# Patient Record
Sex: Male | Born: 1960 | Race: White | Hispanic: No | Smoking: Former smoker
Health system: Southern US, Community
[De-identification: ages and names within clinical notes are randomized; demographics above are authoritative.]

## PROBLEM LIST (undated history)

## (undated) ENCOUNTER — Ambulatory Visit (HOSPITAL_COMMUNITY): Discharge status: Absent without leave | Payer: Medicare Other

## (undated) DIAGNOSIS — N184 Chronic kidney disease, stage 4 (severe): Secondary | ICD-10-CM

## (undated) DIAGNOSIS — M1990 Unspecified osteoarthritis, unspecified site: Secondary | ICD-10-CM

## (undated) DIAGNOSIS — I10 Essential (primary) hypertension: Secondary | ICD-10-CM

## (undated) DIAGNOSIS — F32A Depression, unspecified: Secondary | ICD-10-CM

## (undated) DIAGNOSIS — K219 Gastro-esophageal reflux disease without esophagitis: Secondary | ICD-10-CM

## (undated) DIAGNOSIS — Z992 Dependence on renal dialysis: Secondary | ICD-10-CM

## (undated) DIAGNOSIS — I2510 Atherosclerotic heart disease of native coronary artery without angina pectoris: Secondary | ICD-10-CM

## (undated) DIAGNOSIS — I739 Peripheral vascular disease, unspecified: Secondary | ICD-10-CM

## (undated) DIAGNOSIS — E119 Type 2 diabetes mellitus without complications: Secondary | ICD-10-CM

## (undated) DIAGNOSIS — D638 Anemia in other chronic diseases classified elsewhere: Secondary | ICD-10-CM

## (undated) DIAGNOSIS — I214 Non-ST elevation (NSTEMI) myocardial infarction: Secondary | ICD-10-CM

## (undated) DIAGNOSIS — T4145XA Adverse effect of unspecified anesthetic, initial encounter: Secondary | ICD-10-CM

## (undated) DIAGNOSIS — I499 Cardiac arrhythmia, unspecified: Secondary | ICD-10-CM

## (undated) DIAGNOSIS — F329 Major depressive disorder, single episode, unspecified: Secondary | ICD-10-CM

## (undated) DIAGNOSIS — E7800 Pure hypercholesterolemia, unspecified: Secondary | ICD-10-CM

## (undated) DIAGNOSIS — I509 Heart failure, unspecified: Secondary | ICD-10-CM

## (undated) DIAGNOSIS — T8859XA Other complications of anesthesia, initial encounter: Secondary | ICD-10-CM

## (undated) DIAGNOSIS — J45909 Unspecified asthma, uncomplicated: Secondary | ICD-10-CM

## (undated) HISTORY — PX: INCISION AND DRAINAGE OF WOUND: SHX1803

## (undated) HISTORY — DX: Essential (primary) hypertension: I10

## (undated) HISTORY — DX: Unspecified asthma, uncomplicated: J45.909

---

## 1993-12-21 ENCOUNTER — Encounter: Payer: Self-pay | Admitting: Cardiology

## 2013-07-12 ENCOUNTER — Inpatient Hospital Stay: Payer: Self-pay | Admitting: Student

## 2013-07-12 ENCOUNTER — Ambulatory Visit: Payer: Self-pay | Admitting: Physician Assistant

## 2013-07-12 LAB — CBC WITH DIFFERENTIAL/PLATELET
Basophil %: 1 %
Eosinophil %: 0.1 %
Lymphocyte #: 1.9 10*3/uL (ref 1.0–3.6)
Lymphocyte %: 10.4 %
MCH: 30.2 pg (ref 26.0–34.0)
MCV: 93 fL (ref 80–100)
Monocyte #: 1 x10 3/mm (ref 0.2–1.0)
Monocyte %: 5.6 %
Neutrophil #: 15.2 10*3/uL — ABNORMAL HIGH (ref 1.4–6.5)
Platelet: 283 10*3/uL (ref 150–440)
RBC: 4.19 10*6/uL — ABNORMAL LOW (ref 4.40–5.90)
RDW: 12.7 % (ref 11.5–14.5)

## 2013-07-12 LAB — CREATININE, SERUM
Creatinine: 1.2 mg/dL (ref 0.60–1.30)
EGFR (African American): >60
EGFR (Non-African Amer.): >60

## 2013-07-12 LAB — BASIC METABOLIC PANEL
Anion Gap: 9 (ref 7–16)
Creatinine: 1.38 mg/dL — ABNORMAL HIGH (ref 0.60–1.30)
EGFR (African American): >60
EGFR (Non-African Amer.): 58 — ABNORMAL LOW
Osmolality: 289 (ref 275–301)
Sodium: 133 mmol/L — ABNORMAL LOW (ref 136–145)

## 2013-07-12 LAB — LACTATE DEHYDROGENASE: LDH: 192 U/L (ref 85–241)

## 2013-07-13 LAB — CBC WITH DIFFERENTIAL/PLATELET
Basophil #: 0.1 10*3/uL (ref 0.0–0.1)
Basophil %: 1 %
Eosinophil #: 0.2 10*3/uL (ref 0.0–0.7)
Eosinophil %: 1.6 %
HCT: 35.8 % — ABNORMAL LOW (ref 40.0–52.0)
HGB: 12.2 g/dL — ABNORMAL LOW (ref 13.0–18.0)
Lymphocyte #: 2.5 10*3/uL (ref 1.0–3.6)
Lymphocyte %: 18.1 %
MCH: 30.6 pg (ref 26.0–34.0)
MCHC: 34 g/dL (ref 32.0–36.0)
Monocyte #: 1 x10 3/mm (ref 0.2–1.0)
Neutrophil #: 10.1 10*3/uL — ABNORMAL HIGH (ref 1.4–6.5)
Neutrophil %: 72.2 %
Platelet: 258 10*3/uL (ref 150–440)
RBC: 3.97 10*6/uL — ABNORMAL LOW (ref 4.40–5.90)
RDW: 12.7 % (ref 11.5–14.5)
WBC: 14 10*3/uL — ABNORMAL HIGH (ref 3.8–10.6)

## 2013-07-13 LAB — URINALYSIS, COMPLETE
Bacteria: NONE SEEN
Glucose,UR: >=500 mg/dL (ref 0–75)
Nitrite: NEGATIVE
Protein: 100
RBC,UR: 6 /HPF (ref 0–5)
Specific Gravity: 1.017 (ref 1.003–1.030)

## 2013-07-13 LAB — BASIC METABOLIC PANEL
BUN: 11 mg/dL (ref 7–18)
Calcium, Total: 8.2 mg/dL — ABNORMAL LOW (ref 8.5–10.1)
Co2: 25 mmol/L (ref 21–32)
Creatinine: 1.02 mg/dL (ref 0.60–1.30)
EGFR (African American): >60
EGFR (Non-African Amer.): >60
Glucose: 270 mg/dL — ABNORMAL HIGH (ref 65–99)
Potassium: 3.5 mmol/L (ref 3.5–5.1)
Sodium: 136 mmol/L (ref 136–145)

## 2013-07-13 LAB — HEMOGLOBIN A1C: Hemoglobin A1C: 15.4 % — ABNORMAL HIGH (ref 4.2–6.3)

## 2013-07-13 LAB — LIPID PANEL
Cholesterol: 156 mg/dL (ref 0–200)
HDL Cholesterol: 44 mg/dL (ref 40–60)
Ldl Cholesterol, Calc: 90 mg/dL (ref 0–100)
Triglycerides: 109 mg/dL (ref 0–200)

## 2013-07-14 LAB — CBC WITH DIFFERENTIAL/PLATELET
Basophil #: 0.1 10*3/uL (ref 0.0–0.1)
Basophil %: 0.9 %
Eosinophil #: 0.2 10*3/uL (ref 0.0–0.7)
Eosinophil %: 1.9 %
HCT: 33.8 % — ABNORMAL LOW (ref 40.0–52.0)
Lymphocyte #: 2.3 10*3/uL (ref 1.0–3.6)
Lymphocyte %: 19.5 %
MCH: 30.9 pg (ref 26.0–34.0)
Monocyte #: 1 x10 3/mm (ref 0.2–1.0)
Monocyte %: 8.6 %
Neutrophil #: 8.2 10*3/uL — ABNORMAL HIGH (ref 1.4–6.5)
Neutrophil %: 69.1 %
Platelet: 287 10*3/uL (ref 150–440)
RDW: 12.9 % (ref 11.5–14.5)

## 2013-07-14 LAB — BASIC METABOLIC PANEL
BUN: 13 mg/dL (ref 7–18)
Calcium, Total: 8.1 mg/dL — ABNORMAL LOW (ref 8.5–10.1)
Chloride: 106 mmol/L (ref 98–107)
Co2: 24 mmol/L (ref 21–32)
Creatinine: 1.07 mg/dL (ref 0.60–1.30)
EGFR (Non-African Amer.): >60
Osmolality: 286 (ref 275–301)
Potassium: 3.6 mmol/L (ref 3.5–5.1)

## 2013-07-14 LAB — VANCOMYCIN, TROUGH: Vancomycin, Trough: 10 ug/mL (ref 10–20)

## 2013-07-15 LAB — CBC WITH DIFFERENTIAL/PLATELET
Basophil %: 0.2 %
Eosinophil %: 1.6 %
HCT: 35.2 % — ABNORMAL LOW (ref 40.0–52.0)
HGB: 12.1 g/dL — ABNORMAL LOW (ref 13.0–18.0)
Lymphocyte #: 2.1 10*3/uL (ref 1.0–3.6)
Monocyte %: 8.1 %
Neutrophil %: 74.9 %
RBC: 3.96 10*6/uL — ABNORMAL LOW (ref 4.40–5.90)
RDW: 12.6 % (ref 11.5–14.5)

## 2013-07-16 LAB — CBC WITH DIFFERENTIAL/PLATELET
Basophil #: 0.2 10*3/uL — ABNORMAL HIGH (ref 0.0–0.1)
Eosinophil %: 2.3 %
HCT: 36.6 % — ABNORMAL LOW (ref 40.0–52.0)
HGB: 12.5 g/dL — ABNORMAL LOW (ref 13.0–18.0)
Lymphocyte #: 2.7 10*3/uL (ref 1.0–3.6)
Lymphocyte %: 20.4 %
MCH: 30.4 pg (ref 26.0–34.0)
MCV: 89 fL (ref 80–100)
Monocyte #: 1.3 x10 3/mm — ABNORMAL HIGH (ref 0.2–1.0)
Monocyte %: 9.7 %
Neutrophil #: 8.7 10*3/uL — ABNORMAL HIGH (ref 1.4–6.5)
RBC: 4.13 10*6/uL — ABNORMAL LOW (ref 4.40–5.90)

## 2013-07-16 LAB — BASIC METABOLIC PANEL
BUN: 12 mg/dL (ref 7–18)
Chloride: 103 mmol/L (ref 98–107)
Co2: 29 mmol/L (ref 21–32)
Creatinine: 1.22 mg/dL (ref 0.60–1.30)
Osmolality: 282 (ref 275–301)
Potassium: 3.4 mmol/L — ABNORMAL LOW (ref 3.5–5.1)
Sodium: 139 mmol/L (ref 136–145)

## 2013-07-16 LAB — VANCOMYCIN, TROUGH: Vancomycin, Trough: 20 ug/mL (ref 10–20)

## 2013-07-17 LAB — BASIC METABOLIC PANEL
Anion Gap: 6 — ABNORMAL LOW (ref 7–16)
BUN: 14 mg/dL (ref 7–18)
Calcium, Total: 8.9 mg/dL (ref 8.5–10.1)
Chloride: 102 mmol/L (ref 98–107)
Co2: 28 mmol/L (ref 21–32)
Creatinine: 1.37 mg/dL — ABNORMAL HIGH (ref 0.60–1.30)
EGFR (African American): >60
EGFR (Non-African Amer.): 58 — ABNORMAL LOW
Glucose: 244 mg/dL — ABNORMAL HIGH (ref 65–99)
Osmolality: 281 (ref 275–301)
Sodium: 136 mmol/L (ref 136–145)

## 2013-07-17 LAB — CBC WITH DIFFERENTIAL/PLATELET
Basophil #: 0.1 10*3/uL (ref 0.0–0.1)
Basophil %: 0.4 %
HCT: 36.5 % — ABNORMAL LOW (ref 40.0–52.0)
HGB: 12.4 g/dL — ABNORMAL LOW (ref 13.0–18.0)
Lymphocyte %: 18.7 %
MCH: 30.5 pg (ref 26.0–34.0)
MCHC: 34.1 g/dL (ref 32.0–36.0)
Monocyte %: 7.9 %
Neutrophil #: 10.8 10*3/uL — ABNORMAL HIGH (ref 1.4–6.5)
Neutrophil %: 70.2 %
WBC: 15.3 10*3/uL — ABNORMAL HIGH (ref 3.8–10.6)

## 2013-07-17 LAB — CULTURE, BLOOD (SINGLE)

## 2013-07-18 LAB — VANCOMYCIN, TROUGH: Vancomycin, Trough: 18 ug/mL (ref 10–20)

## 2013-07-18 LAB — CBC WITH DIFFERENTIAL/PLATELET
Basophil #: 0 10*3/uL (ref 0.0–0.1)
Basophil %: 0.3 %
Eosinophil #: 0.3 10*3/uL (ref 0.0–0.7)
Eosinophil %: 1.9 %
HGB: 11.9 g/dL — ABNORMAL LOW (ref 13.0–18.0)
Lymphocyte #: 3 10*3/uL (ref 1.0–3.6)
MCHC: 34.4 g/dL (ref 32.0–36.0)
MCV: 89 fL (ref 80–100)
Monocyte #: 1.6 x10 3/mm — ABNORMAL HIGH (ref 0.2–1.0)
Monocyte %: 9.2 %
Neutrophil %: 71.2 %
RBC: 3.91 10*6/uL — ABNORMAL LOW (ref 4.40–5.90)

## 2013-07-18 LAB — CREATININE, SERUM
Creatinine: 1.3 mg/dL (ref 0.60–1.30)
EGFR (African American): >60
EGFR (Non-African Amer.): >60

## 2013-07-18 LAB — WOUND CULTURE

## 2013-07-19 LAB — CBC WITH DIFFERENTIAL/PLATELET
Basophil #: 0.2 10*3/uL — ABNORMAL HIGH (ref 0.0–0.1)
Basophil %: 1.5 %
Eosinophil #: 0.4 10*3/uL (ref 0.0–0.7)
HCT: 35.3 % — ABNORMAL LOW (ref 40.0–52.0)
HGB: 12.2 g/dL — ABNORMAL LOW (ref 13.0–18.0)
Lymphocyte %: 16.3 %
Monocyte #: 1.5 x10 3/mm — ABNORMAL HIGH (ref 0.2–1.0)
Monocyte %: 9.8 %
Neutrophil #: 10.7 10*3/uL — ABNORMAL HIGH (ref 1.4–6.5)
Platelet: 435 10*3/uL (ref 150–440)
RDW: 12.3 % (ref 11.5–14.5)
WBC: 15.3 10*3/uL — ABNORMAL HIGH (ref 3.8–10.6)

## 2013-07-20 LAB — CBC WITH DIFFERENTIAL/PLATELET
Basophil #: 0.2 10*3/uL — ABNORMAL HIGH (ref 0.0–0.1)
Basophil %: 1.5 %
Eosinophil #: 0.5 10*3/uL (ref 0.0–0.7)
HGB: 11.8 g/dL — ABNORMAL LOW (ref 13.0–18.0)
Lymphocyte #: 2.7 10*3/uL (ref 1.0–3.6)
Lymphocyte %: 18.3 %
MCV: 89 fL (ref 80–100)
Monocyte #: 1.5 x10 3/mm — ABNORMAL HIGH (ref 0.2–1.0)
RBC: 3.87 10*6/uL — ABNORMAL LOW (ref 4.40–5.90)
RDW: 12.6 % (ref 11.5–14.5)

## 2013-07-20 LAB — BASIC METABOLIC PANEL
Anion Gap: 4 — ABNORMAL LOW (ref 7–16)
Calcium, Total: 8.8 mg/dL (ref 8.5–10.1)
Chloride: 101 mmol/L (ref 98–107)
Co2: 30 mmol/L (ref 21–32)
EGFR (Non-African Amer.): 56 — ABNORMAL LOW
Potassium: 3.8 mmol/L (ref 3.5–5.1)
Sodium: 135 mmol/L — ABNORMAL LOW (ref 136–145)

## 2013-07-20 LAB — VANCOMYCIN, TROUGH: Vancomycin, Trough: 21 ug/mL (ref 10–20)

## 2013-07-21 LAB — CBC WITH DIFFERENTIAL/PLATELET
Basophil #: 0.2 10*3/uL — ABNORMAL HIGH (ref 0.0–0.1)
Eosinophil %: 2.7 %
HCT: 34.2 % — ABNORMAL LOW (ref 40.0–52.0)
HGB: 11.5 g/dL — ABNORMAL LOW (ref 13.0–18.0)
Lymphocyte #: 3.3 10*3/uL (ref 1.0–3.6)
Lymphocyte %: 19 %
MCH: 29.8 pg (ref 26.0–34.0)
Monocyte #: 1.4 x10 3/mm — ABNORMAL HIGH (ref 0.2–1.0)
Monocyte %: 8 %
Neutrophil #: 12 10*3/uL — ABNORMAL HIGH (ref 1.4–6.5)
Neutrophil %: 69 %
RDW: 12.7 % (ref 11.5–14.5)
WBC: 17.4 10*3/uL — ABNORMAL HIGH (ref 3.8–10.6)

## 2013-07-22 LAB — CBC WITH DIFFERENTIAL/PLATELET
HGB: 11.7 g/dL — ABNORMAL LOW (ref 13.0–18.0)
Lymphocyte #: 2.3 10*3/uL (ref 1.0–3.6)
MCHC: 34 g/dL (ref 32.0–36.0)
Monocyte #: 1.2 x10 3/mm — ABNORMAL HIGH (ref 0.2–1.0)
Monocyte %: 7.8 %
Neutrophil #: 11.4 10*3/uL — ABNORMAL HIGH (ref 1.4–6.5)
Neutrophil %: 73.5 %
Platelet: 530 10*3/uL — ABNORMAL HIGH (ref 150–440)
WBC: 15.5 10*3/uL — ABNORMAL HIGH (ref 3.8–10.6)

## 2013-07-22 LAB — BASIC METABOLIC PANEL
Anion Gap: 5 — ABNORMAL LOW (ref 7–16)
BUN: 22 mg/dL — ABNORMAL HIGH (ref 7–18)
Calcium, Total: 8.5 mg/dL (ref 8.5–10.1)
EGFR (African American): >60

## 2013-07-22 LAB — VANCOMYCIN, TROUGH: Vancomycin, Trough: 16 ug/mL (ref 10–20)

## 2013-07-23 LAB — CBC WITH DIFFERENTIAL/PLATELET
Basophil #: 0.2 10*3/uL — ABNORMAL HIGH (ref 0.0–0.1)
Eosinophil #: 0.6 10*3/uL (ref 0.0–0.7)
HCT: 34.4 % — ABNORMAL LOW (ref 40.0–52.0)
HGB: 11.9 g/dL — ABNORMAL LOW (ref 13.0–18.0)
Lymphocyte %: 20 %
MCH: 30.1 pg (ref 26.0–34.0)
MCHC: 34.5 g/dL (ref 32.0–36.0)
MCV: 87 fL (ref 80–100)
Monocyte #: 1.2 x10 3/mm — ABNORMAL HIGH (ref 0.2–1.0)
Monocyte %: 8.7 %
Neutrophil %: 65.3 %
Platelet: 564 10*3/uL — ABNORMAL HIGH (ref 150–440)
RDW: 12.5 % (ref 11.5–14.5)

## 2013-07-25 LAB — PATHOLOGY REPORT

## 2013-08-31 HISTORY — PX: TOE AMPUTATION: SHX809

## 2014-02-17 ENCOUNTER — Emergency Department: Payer: Self-pay | Admitting: Emergency Medicine

## 2014-02-20 ENCOUNTER — Emergency Department: Payer: Self-pay | Admitting: Emergency Medicine

## 2014-02-20 LAB — COMPREHENSIVE METABOLIC PANEL
ANION GAP: 9 (ref 7–16)
Albumin: 2.9 g/dL — ABNORMAL LOW (ref 3.4–5.0)
Alkaline Phosphatase: 134 U/L — ABNORMAL HIGH
BILIRUBIN TOTAL: 0.3 mg/dL (ref 0.2–1.0)
BUN: 27 mg/dL — ABNORMAL HIGH (ref 7–18)
CALCIUM: 9.3 mg/dL (ref 8.5–10.1)
CREATININE: 1.71 mg/dL — AB (ref 0.60–1.30)
Chloride: 99 mmol/L (ref 98–107)
Co2: 24 mmol/L (ref 21–32)
EGFR (African American): 51 — ABNORMAL LOW
EGFR (Non-African Amer.): 44 — ABNORMAL LOW
GLUCOSE: 451 mg/dL — AB (ref 65–99)
Osmolality: 289 (ref 275–301)
Potassium: 4.4 mmol/L (ref 3.5–5.1)
SGOT(AST): 12 U/L — ABNORMAL LOW (ref 15–37)
SGPT (ALT): 15 U/L (ref 12–78)
Sodium: 132 mmol/L — ABNORMAL LOW (ref 136–145)
Total Protein: 7.1 g/dL (ref 6.4–8.2)

## 2014-02-20 LAB — CBC WITH DIFFERENTIAL/PLATELET
BASOS ABS: 0 10*3/uL (ref 0.0–0.1)
Basophil %: 0.1 %
EOS ABS: 0.3 10*3/uL (ref 0.0–0.7)
Eosinophil %: 1.7 %
HCT: 36.7 % — ABNORMAL LOW (ref 40.0–52.0)
HGB: 12.5 g/dL — AB (ref 13.0–18.0)
Lymphocyte #: 2.6 10*3/uL (ref 1.0–3.6)
Lymphocyte %: 16.5 %
MCH: 30.8 pg (ref 26.0–34.0)
MCHC: 34 g/dL (ref 32.0–36.0)
MCV: 91 fL (ref 80–100)
MONO ABS: 0.9 x10 3/mm (ref 0.2–1.0)
Monocyte %: 6.1 %
NEUTROS ABS: 11.7 10*3/uL — AB (ref 1.4–6.5)
NEUTROS PCT: 75.6 %
PLATELETS: 297 10*3/uL (ref 150–440)
RBC: 4.05 10*6/uL — ABNORMAL LOW (ref 4.40–5.90)
RDW: 13.4 % (ref 11.5–14.5)
WBC: 15.5 10*3/uL — ABNORMAL HIGH (ref 3.8–10.6)

## 2014-02-20 LAB — HEMOGLOBIN A1C: Hemoglobin A1C: 13.2 % — ABNORMAL HIGH (ref 4.2–6.3)

## 2014-03-05 ENCOUNTER — Encounter: Payer: Self-pay | Admitting: Surgery

## 2014-03-09 LAB — WOUND AEROBIC CULTURE

## 2014-03-31 ENCOUNTER — Encounter: Payer: Self-pay | Admitting: Surgery

## 2014-08-01 ENCOUNTER — Ambulatory Visit: Payer: Self-pay | Admitting: Surgery

## 2014-08-01 ENCOUNTER — Encounter: Payer: Self-pay | Admitting: Surgery

## 2014-08-17 ENCOUNTER — Ambulatory Visit: Payer: Self-pay | Admitting: Surgery

## 2014-08-31 ENCOUNTER — Encounter: Payer: Self-pay | Admitting: Surgery

## 2014-09-14 LAB — WOUND AEROBIC CULTURE

## 2014-09-28 ENCOUNTER — Ambulatory Visit: Payer: Self-pay | Admitting: Surgery

## 2014-10-01 ENCOUNTER — Encounter: Payer: Self-pay | Admitting: Surgery

## 2014-10-03 ENCOUNTER — Inpatient Hospital Stay: Payer: Self-pay | Admitting: Emergency Medicine

## 2014-10-03 LAB — TROPONIN I: Troponin-I: <0.02 ng/mL

## 2014-10-03 LAB — URINALYSIS, COMPLETE
BILIRUBIN, UR: NEGATIVE
Bacteria: NONE SEEN
Glucose,UR: >=500 mg/dL (ref 0–75)
Granular Cast: 1
Hyaline Cast: 3
KETONE: NEGATIVE
Leukocyte Esterase: NEGATIVE
Nitrite: NEGATIVE
PH: 5 (ref 4.5–8.0)
Protein: >=500
SPECIFIC GRAVITY: 1.018 (ref 1.003–1.030)
Squamous Epithelial: NONE SEEN
WBC UR: 1 /HPF (ref 0–5)

## 2014-10-03 LAB — CBC WITH DIFFERENTIAL/PLATELET
Basophil #: 0.2 10*3/uL — ABNORMAL HIGH (ref 0.0–0.1)
Basophil %: 1 %
EOS ABS: 0.1 10*3/uL (ref 0.0–0.7)
Eosinophil %: 0.7 %
HCT: 35.5 % — ABNORMAL LOW (ref 40.0–52.0)
HGB: 11.8 g/dL — ABNORMAL LOW (ref 13.0–18.0)
Lymphocyte #: 2 10*3/uL (ref 1.0–3.6)
Lymphocyte %: 10.3 %
MCH: 30.2 pg (ref 26.0–34.0)
MCHC: 33.4 g/dL (ref 32.0–36.0)
MCV: 90 fL (ref 80–100)
MONO ABS: 1.6 x10 3/mm — AB (ref 0.2–1.0)
Monocyte %: 8.2 %
NEUTROS PCT: 79.8 %
Neutrophil #: 15.8 10*3/uL — ABNORMAL HIGH (ref 1.4–6.5)
PLATELETS: 513 10*3/uL — AB (ref 150–440)
RBC: 3.92 10*6/uL — AB (ref 4.40–5.90)
RDW: 13.3 % (ref 11.5–14.5)
WBC: 19.8 10*3/uL — ABNORMAL HIGH (ref 3.8–10.6)

## 2014-10-03 LAB — COMPREHENSIVE METABOLIC PANEL
ALBUMIN: 1.9 g/dL — AB (ref 3.4–5.0)
ANION GAP: 9 (ref 7–16)
AST: 20 U/L (ref 15–37)
Alkaline Phosphatase: 156 U/L — ABNORMAL HIGH (ref 46–116)
BUN: 26 mg/dL — AB (ref 7–18)
Bilirubin,Total: 0.4 mg/dL (ref 0.2–1.0)
CO2: 26 mmol/L (ref 21–32)
Calcium, Total: 8.8 mg/dL (ref 8.5–10.1)
Chloride: 98 mmol/L (ref 98–107)
Creatinine: 2.38 mg/dL — ABNORMAL HIGH (ref 0.60–1.30)
GFR CALC AF AMER: 37 — AB
GFR CALC NON AF AMER: 30 — AB
Glucose: 453 mg/dL — ABNORMAL HIGH (ref 65–99)
Osmolality: 291 (ref 275–301)
Potassium: 4.2 mmol/L (ref 3.5–5.1)
SGPT (ALT): 24 U/L (ref 14–63)
Sodium: 133 mmol/L — ABNORMAL LOW (ref 136–145)
Total Protein: 7.5 g/dL (ref 6.4–8.2)

## 2014-10-03 LAB — PROTIME-INR
INR: 1.1
PROTHROMBIN TIME: 14.4 s

## 2014-10-03 LAB — MAGNESIUM: MAGNESIUM: 2 mg/dL

## 2014-10-03 LAB — HEMOGLOBIN A1C: Hemoglobin A1C: 10 % — ABNORMAL HIGH (ref 4.2–6.3)

## 2014-10-03 LAB — PHOSPHORUS: Phosphorus: 3.3 mg/dL (ref 2.5–4.9)

## 2014-10-04 LAB — CBC WITH DIFFERENTIAL/PLATELET
BASOS PCT: 0.7 %
Basophil #: 0.2 10*3/uL — ABNORMAL HIGH (ref 0.0–0.1)
EOS ABS: 0.1 10*3/uL (ref 0.0–0.7)
Eosinophil %: 0.6 %
HCT: 29.8 % — AB (ref 40.0–52.0)
HGB: 10.2 g/dL — ABNORMAL LOW (ref 13.0–18.0)
LYMPHS PCT: 15 %
Lymphocyte #: 3.2 10*3/uL (ref 1.0–3.6)
MCH: 30.3 pg (ref 26.0–34.0)
MCHC: 34.2 g/dL (ref 32.0–36.0)
MCV: 89 fL (ref 80–100)
MONO ABS: 2 x10 3/mm — AB (ref 0.2–1.0)
Monocyte %: 9.2 %
NEUTROS ABS: 15.8 10*3/uL — AB (ref 1.4–6.5)
Neutrophil %: 74.5 %
Platelet: 465 10*3/uL — ABNORMAL HIGH (ref 150–440)
RBC: 3.37 10*6/uL — AB (ref 4.40–5.90)
RDW: 13.3 % (ref 11.5–14.5)
WBC: 21.2 10*3/uL — ABNORMAL HIGH (ref 3.8–10.6)

## 2014-10-04 LAB — BASIC METABOLIC PANEL
ANION GAP: 7 (ref 7–16)
BUN: 22 mg/dL — ABNORMAL HIGH (ref 7–18)
Calcium, Total: 8.3 mg/dL — ABNORMAL LOW (ref 8.5–10.1)
Chloride: 104 mmol/L (ref 98–107)
Co2: 25 mmol/L (ref 21–32)
Creatinine: 2.17 mg/dL — ABNORMAL HIGH (ref 0.60–1.30)
GFR CALC AF AMER: 41 — AB
GFR CALC NON AF AMER: 34 — AB
GLUCOSE: 170 mg/dL — AB (ref 65–99)
OSMOLALITY: 279 (ref 275–301)
Potassium: 3.9 mmol/L (ref 3.5–5.1)
Sodium: 136 mmol/L (ref 136–145)

## 2014-10-04 LAB — WOUND AEROBIC CULTURE

## 2014-10-05 LAB — CBC WITH DIFFERENTIAL/PLATELET
BASOS PCT: 0.5 %
Basophil #: 0.1 10*3/uL (ref 0.0–0.1)
EOS ABS: 0.2 10*3/uL (ref 0.0–0.7)
EOS PCT: 0.8 %
HCT: 28.8 % — ABNORMAL LOW (ref 40.0–52.0)
HGB: 9.6 g/dL — AB (ref 13.0–18.0)
LYMPHS PCT: 13.6 %
Lymphocyte #: 2.9 10*3/uL (ref 1.0–3.6)
MCH: 29.6 pg (ref 26.0–34.0)
MCHC: 33.5 g/dL (ref 32.0–36.0)
MCV: 89 fL (ref 80–100)
Monocyte #: 1.9 x10 3/mm — ABNORMAL HIGH (ref 0.2–1.0)
Monocyte %: 8.9 %
NEUTROS PCT: 76.2 %
Neutrophil #: 16.2 10*3/uL — ABNORMAL HIGH (ref 1.4–6.5)
Platelet: 474 10*3/uL — ABNORMAL HIGH (ref 150–440)
RBC: 3.25 10*6/uL — AB (ref 4.40–5.90)
RDW: 13.3 % (ref 11.5–14.5)
WBC: 21.2 10*3/uL — ABNORMAL HIGH (ref 3.8–10.6)

## 2014-10-05 LAB — BASIC METABOLIC PANEL
ANION GAP: 9 (ref 7–16)
BUN: 18 mg/dL (ref 7–18)
CALCIUM: 8 mg/dL — AB (ref 8.5–10.1)
CHLORIDE: 105 mmol/L (ref 98–107)
CREATININE: 2.05 mg/dL — AB (ref 0.60–1.30)
Co2: 24 mmol/L (ref 21–32)
EGFR (African American): 43 — ABNORMAL LOW
EGFR (Non-African Amer.): 36 — ABNORMAL LOW
Glucose: 145 mg/dL — ABNORMAL HIGH (ref 65–99)
OSMOLALITY: 280 (ref 275–301)
Potassium: 3.8 mmol/L (ref 3.5–5.1)
SODIUM: 138 mmol/L (ref 136–145)

## 2014-10-06 LAB — BASIC METABOLIC PANEL
Anion Gap: 9 (ref 7–16)
BUN: 19 mg/dL — ABNORMAL HIGH (ref 7–18)
CALCIUM: 8.1 mg/dL — AB (ref 8.5–10.1)
CHLORIDE: 106 mmol/L (ref 98–107)
CO2: 23 mmol/L (ref 21–32)
Creatinine: 2.3 mg/dL — ABNORMAL HIGH (ref 0.60–1.30)
EGFR (African American): 38 — ABNORMAL LOW
EGFR (Non-African Amer.): 31 — ABNORMAL LOW
Glucose: 163 mg/dL — ABNORMAL HIGH (ref 65–99)
Osmolality: 282 (ref 275–301)
Potassium: 3.9 mmol/L (ref 3.5–5.1)
SODIUM: 138 mmol/L (ref 136–145)

## 2014-10-06 LAB — CBC WITH DIFFERENTIAL/PLATELET
BASOS PCT: 1 %
Basophil #: 0.2 10*3/uL — ABNORMAL HIGH (ref 0.0–0.1)
EOS ABS: 0.3 10*3/uL (ref 0.0–0.7)
EOS PCT: 1.4 %
HCT: 29.5 % — AB (ref 40.0–52.0)
HGB: 9.8 g/dL — AB (ref 13.0–18.0)
Lymphocyte #: 3 10*3/uL (ref 1.0–3.6)
Lymphocyte %: 12.5 %
MCH: 29.8 pg (ref 26.0–34.0)
MCHC: 33.2 g/dL (ref 32.0–36.0)
MCV: 90 fL (ref 80–100)
MONOS PCT: 8.3 %
Monocyte #: 2 x10 3/mm — ABNORMAL HIGH (ref 0.2–1.0)
Neutrophil #: 18.6 10*3/uL — ABNORMAL HIGH (ref 1.4–6.5)
Neutrophil %: 76.8 %
Platelet: 482 10*3/uL — ABNORMAL HIGH (ref 150–440)
RBC: 3.28 10*6/uL — ABNORMAL LOW (ref 4.40–5.90)
RDW: 13.4 % (ref 11.5–14.5)
WBC: 24.2 10*3/uL — AB (ref 3.8–10.6)

## 2014-10-06 LAB — SEDIMENTATION RATE: Erythrocyte Sed Rate: 126 mm/hr — ABNORMAL HIGH (ref 0–20)

## 2014-10-07 LAB — CBC WITH DIFFERENTIAL/PLATELET
BASOS ABS: 0.2 10*3/uL — AB (ref 0.0–0.1)
Basophil %: 0.8 %
Eosinophil #: 0.6 10*3/uL (ref 0.0–0.7)
Eosinophil %: 3.1 %
HCT: 27.5 % — ABNORMAL LOW (ref 40.0–52.0)
HGB: 8.9 g/dL — AB (ref 13.0–18.0)
LYMPHS PCT: 15.2 %
Lymphocyte #: 2.8 10*3/uL (ref 1.0–3.6)
MCH: 29.3 pg (ref 26.0–34.0)
MCHC: 32.5 g/dL (ref 32.0–36.0)
MCV: 90 fL (ref 80–100)
Monocyte #: 1.6 x10 3/mm — ABNORMAL HIGH (ref 0.2–1.0)
Monocyte %: 8.5 %
Neutrophil #: 13.4 10*3/uL — ABNORMAL HIGH (ref 1.4–6.5)
Neutrophil %: 72.4 %
Platelet: 491 10*3/uL — ABNORMAL HIGH (ref 150–440)
RBC: 3.05 10*6/uL — ABNORMAL LOW (ref 4.40–5.90)
RDW: 13.4 % (ref 11.5–14.5)
WBC: 18.5 10*3/uL — ABNORMAL HIGH (ref 3.8–10.6)

## 2014-10-07 LAB — WOUND CULTURE

## 2014-10-07 LAB — BASIC METABOLIC PANEL
Anion Gap: 6 — ABNORMAL LOW (ref 7–16)
BUN: 23 mg/dL — ABNORMAL HIGH (ref 7–18)
CALCIUM: 8.2 mg/dL — AB (ref 8.5–10.1)
CHLORIDE: 108 mmol/L — AB (ref 98–107)
CO2: 25 mmol/L (ref 21–32)
Creatinine: 2.47 mg/dL — ABNORMAL HIGH (ref 0.60–1.30)
EGFR (African American): 35 — ABNORMAL LOW
GFR CALC NON AF AMER: 29 — AB
Glucose: 106 mg/dL — ABNORMAL HIGH (ref 65–99)
OSMOLALITY: 282 (ref 275–301)
POTASSIUM: 3.9 mmol/L (ref 3.5–5.1)
SODIUM: 139 mmol/L (ref 136–145)

## 2014-10-07 LAB — VANCOMYCIN, TROUGH: Vancomycin, Trough: 13 ug/mL (ref 10–20)

## 2014-10-08 LAB — BASIC METABOLIC PANEL
Anion Gap: 8 (ref 7–16)
BUN: 18 mg/dL (ref 7–18)
CALCIUM: 8 mg/dL — AB (ref 8.5–10.1)
Chloride: 105 mmol/L (ref 98–107)
Co2: 23 mmol/L (ref 21–32)
Creatinine: 2.28 mg/dL — ABNORMAL HIGH (ref 0.60–1.30)
EGFR (African American): 38 — ABNORMAL LOW
GFR CALC NON AF AMER: 32 — AB
Glucose: 186 mg/dL — ABNORMAL HIGH (ref 65–99)
OSMOLALITY: 279 (ref 275–301)
POTASSIUM: 3.7 mmol/L (ref 3.5–5.1)
Sodium: 136 mmol/L (ref 136–145)

## 2014-10-08 LAB — WOUND CULTURE

## 2014-10-08 LAB — CULTURE, BLOOD (SINGLE)

## 2014-10-14 ENCOUNTER — Ambulatory Visit: Payer: Self-pay

## 2014-10-22 ENCOUNTER — Other Ambulatory Visit: Payer: Self-pay | Admitting: Infectious Diseases

## 2014-11-30 HISTORY — PX: CARDIAC CATHETERIZATION: SHX172

## 2014-12-01 ENCOUNTER — Inpatient Hospital Stay
Admit: 2014-12-01 | Disposition: A | Discharge status: Home / Self Care | Payer: Self-pay | Attending: Internal Medicine | Admitting: Internal Medicine

## 2014-12-01 LAB — TROPONIN I
TROPONIN-I: 1.53 ng/mL — AB
Troponin-I: 0.05 ng/mL — ABNORMAL HIGH
Troponin-I: 4.31 ng/mL — ABNORMAL HIGH

## 2014-12-01 LAB — CBC
HCT: 32.6 % — ABNORMAL LOW (ref 40.0–52.0)
HGB: 10.4 g/dL — ABNORMAL LOW (ref 13.0–18.0)
MCH: 29.2 pg (ref 26.0–34.0)
MCHC: 32.1 g/dL (ref 32.0–36.0)
MCV: 91 fL (ref 80–100)
Platelet: 396 10*3/uL (ref 150–440)
RBC: 3.58 10*6/uL — ABNORMAL LOW (ref 4.40–5.90)
RDW: 16 % — ABNORMAL HIGH (ref 11.5–14.5)
WBC: 15.1 10*3/uL — ABNORMAL HIGH (ref 3.8–10.6)

## 2014-12-01 LAB — HEPARIN LEVEL (UNFRACTIONATED)
ANTI-XA(UNFRACTIONATED): 0.1 [IU]/mL — AB (ref 0.30–0.70)
Anti-Xa(Unfractionated): 0.14 IU/mL — ABNORMAL LOW (ref 0.30–0.70)

## 2014-12-01 LAB — BASIC METABOLIC PANEL
Anion Gap: 7 (ref 7–16)
BUN: 25 mg/dL — ABNORMAL HIGH
CO2: 21 mmol/L — AB
Calcium, Total: 7.8 mg/dL — ABNORMAL LOW
Chloride: 111 mmol/L
Creatinine: 1.7 mg/dL — ABNORMAL HIGH
EGFR (African American): 51 — ABNORMAL LOW
EGFR (Non-African Amer.): 44 — ABNORMAL LOW
GLUCOSE: 359 mg/dL — AB
Potassium: 5.6 mmol/L — ABNORMAL HIGH
SODIUM: 139 mmol/L

## 2014-12-01 LAB — APTT: ACTIVATED PTT: 32.1 s (ref 23.6–35.9)

## 2014-12-01 LAB — PRO B NATRIURETIC PEPTIDE: B-Type Natriuretic Peptide: 130 pg/mL — ABNORMAL HIGH

## 2014-12-01 LAB — CK-MB
CK-MB: 15.1 ng/mL — ABNORMAL HIGH
CK-MB: 18 ng/mL — ABNORMAL HIGH
CK-MB: 15.4 ng/mL — AB

## 2014-12-01 LAB — PROTIME-INR
INR: 0.9
Prothrombin Time: 12.8 secs

## 2014-12-02 LAB — BASIC METABOLIC PANEL
Anion Gap: 7 (ref 7–16)
BUN: 26 mg/dL — ABNORMAL HIGH
CO2: 24 mmol/L
CREATININE: 1.72 mg/dL — AB
Calcium, Total: 8.2 mg/dL — ABNORMAL LOW
Chloride: 109 mmol/L
GFR CALC AF AMER: 50 — AB
GFR CALC NON AF AMER: 43 — AB
GLUCOSE: 210 mg/dL — AB
Potassium: 4.1 mmol/L
SODIUM: 140 mmol/L

## 2014-12-02 LAB — CBC WITH DIFFERENTIAL/PLATELET
BASOS PCT: 1 %
Basophil #: 0.1 10*3/uL (ref 0.0–0.1)
Eosinophil #: 0.4 10*3/uL (ref 0.0–0.7)
Eosinophil %: 3.2 %
HCT: 30.1 % — AB (ref 40.0–52.0)
HGB: 9.9 g/dL — AB (ref 13.0–18.0)
Lymphocyte #: 2.6 10*3/uL (ref 1.0–3.6)
Lymphocyte %: 23.5 %
MCH: 29.5 pg (ref 26.0–34.0)
MCHC: 32.9 g/dL (ref 32.0–36.0)
MCV: 90 fL (ref 80–100)
Monocyte #: 0.7 x10 3/mm (ref 0.2–1.0)
Monocyte %: 6.1 %
NEUTROS ABS: 7.4 10*3/uL — AB (ref 1.4–6.5)
Neutrophil %: 66.2 %
Platelet: 395 10*3/uL (ref 150–440)
RBC: 3.36 10*6/uL — ABNORMAL LOW (ref 4.40–5.90)
RDW: 15.9 % — AB (ref 11.5–14.5)
WBC: 11.2 10*3/uL — ABNORMAL HIGH (ref 3.8–10.6)

## 2014-12-02 LAB — LIPID PANEL
Cholesterol: 146 mg/dL
HDL Cholesterol: 45 mg/dL
Ldl Cholesterol, Calc: 74 mg/dL
Triglycerides: 134 mg/dL
VLDL CHOLESTEROL, CALC: 27 mg/dL

## 2014-12-02 LAB — HEPARIN LEVEL (UNFRACTIONATED)
ANTI-XA(UNFRACTIONATED): 0.31 [IU]/mL (ref 0.30–0.70)
Anti-Xa(Unfractionated): 0.35 IU/mL (ref 0.30–0.70)
Anti-Xa(Unfractionated): 0.29 IU/mL — ABNORMAL LOW (ref 0.30–0.70)

## 2014-12-03 LAB — RENAL FUNCTION PANEL
Albumin: 2.3 g/dL — ABNORMAL LOW
Anion Gap: 6 — ABNORMAL LOW (ref 7–16)
BUN: 23 mg/dL — ABNORMAL HIGH
CALCIUM: 8.3 mg/dL — AB
CHLORIDE: 110 mmol/L
Co2: 25 mmol/L
Creatinine: 1.67 mg/dL — ABNORMAL HIGH
EGFR (African American): 52 — ABNORMAL LOW
EGFR (Non-African Amer.): 45 — ABNORMAL LOW
Glucose: 128 mg/dL — ABNORMAL HIGH
Phosphorus: 4.3 mg/dL
Potassium: 3.8 mmol/L
Sodium: 141 mmol/L

## 2014-12-03 LAB — URINALYSIS, COMPLETE
BILIRUBIN, UR: NEGATIVE
BLOOD: NEGATIVE
Bacteria: NONE SEEN
Glucose,UR: 50 mg/dL (ref 0–75)
KETONE: NEGATIVE
Leukocyte Esterase: NEGATIVE
Nitrite: NEGATIVE
PH: 6 (ref 4.5–8.0)
Protein: >=500
RBC,UR: 1 /HPF (ref 0–5)
SPECIFIC GRAVITY: 1.011 (ref 1.003–1.030)
WBC UR: 1 /HPF (ref 0–5)

## 2014-12-03 LAB — CBC WITH DIFFERENTIAL/PLATELET
Basophil #: 0.1 10*3/uL (ref 0.0–0.1)
Basophil %: 1.1 %
Eosinophil #: 0.5 10*3/uL (ref 0.0–0.7)
Eosinophil %: 4.3 %
HCT: 29.5 % — AB (ref 40.0–52.0)
HGB: 9.5 g/dL — ABNORMAL LOW (ref 13.0–18.0)
Lymphocyte #: 2.6 10*3/uL (ref 1.0–3.6)
Lymphocyte %: 23.3 %
MCH: 28.8 pg (ref 26.0–34.0)
MCHC: 32.3 g/dL (ref 32.0–36.0)
MCV: 89 fL (ref 80–100)
MONO ABS: 0.7 x10 3/mm (ref 0.2–1.0)
MONOS PCT: 6.6 %
NEUTROS ABS: 7.3 10*3/uL — AB (ref 1.4–6.5)
Neutrophil %: 64.7 %
Platelet: 367 10*3/uL (ref 150–440)
RBC: 3.3 10*6/uL — ABNORMAL LOW (ref 4.40–5.90)
RDW: 15.6 % — AB (ref 11.5–14.5)
WBC: 11.2 10*3/uL — AB (ref 3.8–10.6)

## 2014-12-03 LAB — HEPATIC FUNCTION PANEL A (ARMC)
ALBUMIN: 2.4 g/dL — AB
Alkaline Phosphatase: 106 U/L
Bilirubin,Total: 0.1 mg/dL — ABNORMAL LOW
SGOT(AST): 12 U/L — ABNORMAL LOW
SGPT (ALT): 8 U/L — ABNORMAL LOW
TOTAL PROTEIN: 5.8 g/dL — AB

## 2014-12-03 LAB — HEPARIN LEVEL (UNFRACTIONATED): Anti-Xa(Unfractionated): 0.37 IU/mL (ref 0.30–0.70)

## 2014-12-03 LAB — PROTEIN / CREATININE RATIO, URINE
Creatinine, Urine Random: 63 mg/dL (ref 30–125)
Protein, Urine: 279 mg/dL (ref 0–9)
Protein/Creat. Ratio: >3000 mg/gCREAT (ref 0–200)

## 2014-12-04 LAB — BASIC METABOLIC PANEL
Anion Gap: 8 (ref 7–16)
BUN: 21 mg/dL — ABNORMAL HIGH
CALCIUM: 8.2 mg/dL — AB
CHLORIDE: 105 mmol/L
CO2: 26 mmol/L
Creatinine: 1.71 mg/dL — ABNORMAL HIGH
EGFR (African American): 51 — ABNORMAL LOW
GFR CALC NON AF AMER: 44 — AB
Glucose: 151 mg/dL — ABNORMAL HIGH
Potassium: 3.5 mmol/L
SODIUM: 139 mmol/L

## 2014-12-04 LAB — CBC WITH DIFFERENTIAL/PLATELET
Basophil #: 0.1 10*3/uL (ref 0.0–0.1)
Basophil %: 0.8 %
Eosinophil #: 0.5 10*3/uL (ref 0.0–0.7)
Eosinophil %: 5 %
HCT: 29.7 % — ABNORMAL LOW (ref 40.0–52.0)
HGB: 9.6 g/dL — ABNORMAL LOW (ref 13.0–18.0)
Lymphocyte #: 2.3 10*3/uL (ref 1.0–3.6)
Lymphocyte %: 25.4 %
MCH: 28.8 pg (ref 26.0–34.0)
MCHC: 32.3 g/dL (ref 32.0–36.0)
MCV: 89 fL (ref 80–100)
Monocyte #: 0.8 x10 3/mm (ref 0.2–1.0)
Monocyte %: 8.3 %
Neutrophil #: 5.5 10*3/uL (ref 1.4–6.5)
Neutrophil %: 60.5 %
Platelet: 341 10*3/uL (ref 150–440)
RBC: 3.33 10*6/uL — ABNORMAL LOW (ref 4.40–5.90)
RDW: 15.3 % — ABNORMAL HIGH (ref 11.5–14.5)
WBC: 9.1 10*3/uL (ref 3.8–10.6)

## 2014-12-04 LAB — PROTEIN ELECTROPHORESIS(ARMC)

## 2014-12-04 LAB — KAPPA/LAMBDA FREE LIGHT CHAINS (ARMC)

## 2014-12-05 LAB — UR PROT ELECTROPHORESIS, URINE RANDOM

## 2014-12-06 LAB — CULTURE, BLOOD (SINGLE)

## 2014-12-13 ENCOUNTER — Inpatient Hospital Stay (HOSPITAL_COMMUNITY): Payer: Medicaid Other

## 2014-12-13 ENCOUNTER — Inpatient Hospital Stay (HOSPITAL_COMMUNITY)
Admission: AD | Admit: 2014-12-13 | Discharge: 2014-12-19 | DRG: 246 | Disposition: A | Discharge status: Home / Self Care | Payer: Medicaid Other | Source: Other Acute Inpatient Hospital | Attending: Cardiology | Admitting: Cardiology

## 2014-12-13 ENCOUNTER — Encounter (HOSPITAL_COMMUNITY): Payer: Self-pay | Admitting: General Practice

## 2014-12-13 ENCOUNTER — Emergency Department
Admit: 2014-12-13 | Disposition: A | Discharge status: Other Acute Inpatient Hospital | Payer: Self-pay | Admitting: Emergency Medicine

## 2014-12-13 DIAGNOSIS — I5023 Acute on chronic systolic (congestive) heart failure: Principal | ICD-10-CM | POA: Diagnosis present

## 2014-12-13 DIAGNOSIS — E119 Type 2 diabetes mellitus without complications: Secondary | ICD-10-CM | POA: Diagnosis present

## 2014-12-13 DIAGNOSIS — F17210 Nicotine dependence, cigarettes, uncomplicated: Secondary | ICD-10-CM | POA: Diagnosis present

## 2014-12-13 DIAGNOSIS — Z794 Long term (current) use of insulin: Secondary | ICD-10-CM

## 2014-12-13 DIAGNOSIS — R0602 Shortness of breath: Secondary | ICD-10-CM | POA: Diagnosis present

## 2014-12-13 DIAGNOSIS — Z8249 Family history of ischemic heart disease and other diseases of the circulatory system: Secondary | ICD-10-CM

## 2014-12-13 DIAGNOSIS — I252 Old myocardial infarction: Secondary | ICD-10-CM

## 2014-12-13 DIAGNOSIS — I739 Peripheral vascular disease, unspecified: Secondary | ICD-10-CM | POA: Diagnosis present

## 2014-12-13 DIAGNOSIS — L03115 Cellulitis of right lower limb: Secondary | ICD-10-CM | POA: Diagnosis present

## 2014-12-13 DIAGNOSIS — D638 Anemia in other chronic diseases classified elsewhere: Secondary | ICD-10-CM | POA: Diagnosis present

## 2014-12-13 DIAGNOSIS — L97519 Non-pressure chronic ulcer of other part of right foot with unspecified severity: Secondary | ICD-10-CM | POA: Diagnosis present

## 2014-12-13 DIAGNOSIS — I129 Hypertensive chronic kidney disease with stage 1 through stage 4 chronic kidney disease, or unspecified chronic kidney disease: Secondary | ICD-10-CM | POA: Diagnosis present

## 2014-12-13 DIAGNOSIS — E780 Pure hypercholesterolemia: Secondary | ICD-10-CM | POA: Diagnosis present

## 2014-12-13 DIAGNOSIS — N184 Chronic kidney disease, stage 4 (severe): Secondary | ICD-10-CM | POA: Diagnosis present

## 2014-12-13 DIAGNOSIS — M868X7 Other osteomyelitis, ankle and foot: Secondary | ICD-10-CM | POA: Diagnosis present

## 2014-12-13 DIAGNOSIS — Z955 Presence of coronary angioplasty implant and graft: Secondary | ICD-10-CM

## 2014-12-13 DIAGNOSIS — K219 Gastro-esophageal reflux disease without esophagitis: Secondary | ICD-10-CM | POA: Diagnosis present

## 2014-12-13 DIAGNOSIS — I5020 Unspecified systolic (congestive) heart failure: Secondary | ICD-10-CM

## 2014-12-13 DIAGNOSIS — I5021 Acute systolic (congestive) heart failure: Secondary | ICD-10-CM

## 2014-12-13 DIAGNOSIS — L0390 Cellulitis, unspecified: Secondary | ICD-10-CM

## 2014-12-13 DIAGNOSIS — Z89421 Acquired absence of other right toe(s): Secondary | ICD-10-CM

## 2014-12-13 DIAGNOSIS — I509 Heart failure, unspecified: Secondary | ICD-10-CM

## 2014-12-13 DIAGNOSIS — I214 Non-ST elevation (NSTEMI) myocardial infarction: Secondary | ICD-10-CM | POA: Diagnosis present

## 2014-12-13 DIAGNOSIS — I2510 Atherosclerotic heart disease of native coronary artery without angina pectoris: Secondary | ICD-10-CM | POA: Diagnosis present

## 2014-12-13 HISTORY — DX: Non-ST elevation (NSTEMI) myocardial infarction: I21.4

## 2014-12-13 HISTORY — DX: Unspecified osteoarthritis, unspecified site: M19.90

## 2014-12-13 HISTORY — DX: Pure hypercholesterolemia, unspecified: E78.00

## 2014-12-13 HISTORY — DX: Anemia in other chronic diseases classified elsewhere: D63.8

## 2014-12-13 HISTORY — DX: Peripheral vascular disease, unspecified: I73.9

## 2014-12-13 HISTORY — DX: Atherosclerotic heart disease of native coronary artery without angina pectoris: I25.10

## 2014-12-13 HISTORY — DX: Heart failure, unspecified: I50.9

## 2014-12-13 HISTORY — DX: Major depressive disorder, single episode, unspecified: F32.9

## 2014-12-13 HISTORY — DX: Depression, unspecified: F32.A

## 2014-12-13 HISTORY — DX: Gastro-esophageal reflux disease without esophagitis: K21.9

## 2014-12-13 HISTORY — DX: Type 2 diabetes mellitus without complications: E11.9

## 2014-12-13 HISTORY — DX: Chronic kidney disease, stage 4 (severe): N18.4

## 2014-12-13 HISTORY — DX: Cardiac arrhythmia, unspecified: I49.9

## 2014-12-13 LAB — CBC
HCT: 33.8 % — ABNORMAL LOW (ref 40.0–52.0)
HCT: 28.7 % — ABNORMAL LOW (ref 39.0–52.0)
HEMOGLOBIN: 9.1 g/dL — AB (ref 13.0–17.0)
HGB: 10.7 g/dL — ABNORMAL LOW (ref 13.0–18.0)
MCH: 28.5 pg (ref 26.0–34.0)
MCH: 28.9 pg (ref 26.0–34.0)
MCHC: 31.7 g/dL — ABNORMAL LOW (ref 32.0–36.0)
MCHC: 31.7 g/dL (ref 30.0–36.0)
MCV: 90 fL (ref 80–100)
MCV: 91.1 fL (ref 78.0–100.0)
Platelet: 510 10*3/uL — ABNORMAL HIGH (ref 150–440)
Platelets: 419 10*3/uL — ABNORMAL HIGH (ref 150–400)
RBC: 3.76 10*6/uL — ABNORMAL LOW (ref 4.40–5.90)
RBC: 3.15 MIL/uL — AB (ref 4.22–5.81)
RDW: 15.5 % — ABNORMAL HIGH (ref 11.5–14.5)
RDW: 14.8 % (ref 11.5–15.5)
WBC: 19.1 10*3/uL — ABNORMAL HIGH (ref 3.8–10.6)
WBC: 11 10*3/uL — AB (ref 4.0–10.5)

## 2014-12-13 LAB — COMPREHENSIVE METABOLIC PANEL
ALK PHOS: 149 U/L — AB
ALK PHOS: 107 U/L (ref 39–117)
ALT: 13 U/L — AB
ALT: 10 U/L (ref 0–53)
ANION GAP: 11 (ref 5–15)
AST: 12 U/L (ref 0–37)
Albumin: 2.8 g/dL — ABNORMAL LOW
Albumin: 2.3 g/dL — ABNORMAL LOW (ref 3.5–5.2)
Anion Gap: 6 — ABNORMAL LOW (ref 7–16)
BILIRUBIN TOTAL: 0.3 mg/dL
BILIRUBIN TOTAL: 0.4 mg/dL (ref 0.3–1.2)
BUN: 25 mg/dL — ABNORMAL HIGH
BUN: 24 mg/dL — AB (ref 6–23)
CHLORIDE: 109 mmol/L (ref 96–112)
CO2: 23 mmol/L
CO2: 18 mmol/L — AB (ref 19–32)
CREATININE: 1.95 mg/dL — AB
Calcium, Total: 8 mg/dL — ABNORMAL LOW
Calcium: 7.9 mg/dL — ABNORMAL LOW (ref 8.4–10.5)
Chloride: 111 mmol/L
Creatinine, Ser: 2.04 mg/dL — ABNORMAL HIGH (ref 0.50–1.35)
EGFR (African American): 43 — ABNORMAL LOW
EGFR (Non-African Amer.): 37 — ABNORMAL LOW
GFR calc non Af Amer: 35 mL/min — ABNORMAL LOW (ref >90)
GFR, EST AFRICAN AMERICAN: 40 mL/min — AB (ref >90)
GLUCOSE: 213 mg/dL — AB
Glucose, Bld: 237 mg/dL — ABNORMAL HIGH (ref 70–99)
POTASSIUM: 4.5 mmol/L (ref 3.5–5.1)
Potassium: 4.6 mmol/L
SGOT(AST): 19 U/L
SODIUM: 140 mmol/L
Sodium: 138 mmol/L (ref 135–145)
TOTAL PROTEIN: 5.8 g/dL — AB (ref 6.0–8.3)
Total Protein: 7.2 g/dL

## 2014-12-13 LAB — TROPONIN I
TROPONIN-I: 0.04 ng/mL — AB
Troponin I: 0.1 ng/mL — ABNORMAL HIGH (ref <0.031)
Troponin I: 0.11 ng/mL — ABNORMAL HIGH (ref <0.031)

## 2014-12-13 LAB — BRAIN NATRIURETIC PEPTIDE: B Natriuretic Peptide: 272.2 pg/mL — ABNORMAL HIGH (ref 0.0–100.0)

## 2014-12-13 LAB — TSH: TSH: 2.107 u[IU]/mL (ref 0.350–4.500)

## 2014-12-13 LAB — GLUCOSE, CAPILLARY
GLUCOSE-CAPILLARY: 158 mg/dL — AB (ref 70–99)
GLUCOSE-CAPILLARY: 207 mg/dL — AB (ref 70–99)
Glucose-Capillary: 177 mg/dL — ABNORMAL HIGH (ref 70–99)

## 2014-12-13 LAB — PRO B NATRIURETIC PEPTIDE: B-Type Natriuretic Peptide: 228 pg/mL — ABNORMAL HIGH

## 2014-12-13 LAB — MAGNESIUM: Magnesium: 2 mg/dL (ref 1.5–2.5)

## 2014-12-13 LAB — HEPARIN LEVEL (UNFRACTIONATED): Heparin Unfractionated: 0.18 IU/mL — ABNORMAL LOW (ref 0.30–0.70)

## 2014-12-13 MED ORDER — ONDANSETRON HCL 4 MG/2ML IJ SOLN: 4.0000 mg | Freq: Four times a day (QID) | INTRAMUSCULAR | Status: DC | PRN

## 2014-12-13 MED ORDER — SODIUM CHLORIDE 0.9 % IJ SOLN
3.0000 mL | Freq: Two times a day (BID) | INTRAMUSCULAR | Status: DC
  Administered 2014-12-13: 3 mL via INTRAVENOUS

## 2014-12-13 MED ORDER — SODIUM CHLORIDE 0.9 % IV SOLN
INTRAVENOUS | Status: DC
  Administered 2014-12-14: 06:00:00 via INTRAVENOUS

## 2014-12-13 MED ORDER — HEPARIN (PORCINE) IN NACL 100-0.45 UNIT/ML-% IJ SOLN
1700.0000 [IU]/h | INTRAMUSCULAR | Status: DC
  Administered 2014-12-13: 1250 [IU]/h via INTRAVENOUS
  Administered 2014-12-14: 1600 [IU]/h via INTRAVENOUS
  Filled 2014-12-13 (×3): qty 250

## 2014-12-13 MED ORDER — NITROGLYCERIN IN D5W 200-5 MCG/ML-% IV SOLN
10.0000 ug/min | INTRAVENOUS | Status: DC
Start: 2014-12-13 — End: 2014-12-14
  Administered 2014-12-13: 10 ug/min via INTRAVENOUS
  Filled 2014-12-13: qty 250

## 2014-12-13 MED ORDER — PIPERACILLIN-TAZOBACTAM 3.375 G IVPB
3.3750 g | Freq: Three times a day (TID) | INTRAVENOUS | Status: DC
  Administered 2014-12-14 – 2014-12-19 (×17): 3.375 g via INTRAVENOUS
  Filled 2014-12-13 (×20): qty 50

## 2014-12-13 MED ORDER — CEFAZOLIN SODIUM 1-5 GM-% IV SOLN
1.0000 g | Freq: Three times a day (TID) | INTRAVENOUS | Status: DC
  Administered 2014-12-13: 1 g via INTRAVENOUS
  Filled 2014-12-13 (×3): qty 50

## 2014-12-13 MED ORDER — SODIUM CHLORIDE 0.9 % IV SOLN: 250.0000 mL | INTRAVENOUS | Status: DC | PRN

## 2014-12-13 MED ORDER — ASPIRIN EC 81 MG PO TBEC: 81.0000 mg | DELAYED_RELEASE_TABLET | Freq: Every day | ORAL | Status: DC

## 2014-12-13 MED ORDER — HEPARIN BOLUS VIA INFUSION
4000.0000 [IU] | Freq: Once | INTRAVENOUS | Status: AC
  Administered 2014-12-13: 4000 [IU] via INTRAVENOUS
  Filled 2014-12-13: qty 4000

## 2014-12-13 MED ORDER — LISINOPRIL 10 MG PO TABS
10.0000 mg | ORAL_TABLET | Freq: Every day | ORAL | Status: DC
  Administered 2014-12-14: 10 mg via ORAL
  Filled 2014-12-13: qty 1

## 2014-12-13 MED ORDER — HEPARIN BOLUS VIA INFUSION
2000.0000 [IU] | Freq: Once | INTRAVENOUS | Status: AC
  Administered 2014-12-13: 2000 [IU] via INTRAVENOUS
  Filled 2014-12-13: qty 2000

## 2014-12-13 MED ORDER — VANCOMYCIN HCL 10 G IV SOLR
1250.0000 mg | INTRAVENOUS | Status: DC
  Administered 2014-12-14 – 2014-12-17 (×4): 1250 mg via INTRAVENOUS
  Filled 2014-12-13 (×6): qty 1250

## 2014-12-13 MED ORDER — ACETAMINOPHEN 325 MG PO TABS: 650.0000 mg | ORAL_TABLET | ORAL | Status: DC | PRN

## 2014-12-13 MED ORDER — CLOPIDOGREL BISULFATE 75 MG PO TABS
75.0000 mg | ORAL_TABLET | Freq: Every day | ORAL | Status: DC
  Administered 2014-12-14: 75 mg via ORAL
  Filled 2014-12-13: qty 1

## 2014-12-13 MED ORDER — ASPIRIN 81 MG PO CHEW
324.0000 mg | CHEWABLE_TABLET | ORAL | Status: AC
  Filled 2014-12-13: qty 4

## 2014-12-13 MED ORDER — PIPERACILLIN-TAZOBACTAM 3.375 G IVPB 30 MIN
3.3750 g | Freq: Once | INTRAVENOUS | Status: AC
  Administered 2014-12-13: 3.375 g via INTRAVENOUS
  Filled 2014-12-13: qty 50

## 2014-12-13 MED ORDER — ASPIRIN 81 MG PO CHEW
81.0000 mg | CHEWABLE_TABLET | ORAL | Status: AC
  Administered 2014-12-14: 81 mg via ORAL
  Filled 2014-12-13: qty 1

## 2014-12-13 MED ORDER — FUROSEMIDE 10 MG/ML IJ SOLN
40.0000 mg | Freq: Two times a day (BID) | INTRAMUSCULAR | Status: DC
  Administered 2014-12-13 – 2014-12-16 (×6): 40 mg via INTRAVENOUS
  Filled 2014-12-13 (×9): qty 4

## 2014-12-13 MED ORDER — ATORVASTATIN CALCIUM 80 MG PO TABS
80.0000 mg | ORAL_TABLET | Freq: Every day | ORAL | Status: DC
  Administered 2014-12-13 – 2014-12-18 (×6): 80 mg via ORAL
  Filled 2014-12-13 (×7): qty 1

## 2014-12-13 MED ORDER — ASPIRIN EC 81 MG PO TBEC
81.0000 mg | DELAYED_RELEASE_TABLET | Freq: Every day | ORAL | Status: DC
Start: 2014-12-14 — End: 2014-12-14
  Administered 2014-12-14: 81 mg via ORAL
  Filled 2014-12-13: qty 1

## 2014-12-13 MED ORDER — VANCOMYCIN HCL 10 G IV SOLR
1750.0000 mg | Freq: Once | INTRAVENOUS | Status: AC
  Administered 2014-12-13: 1750 mg via INTRAVENOUS
  Filled 2014-12-13: qty 1750

## 2014-12-13 MED ORDER — NITROGLYCERIN 0.4 MG SL SUBL: 0.4000 mg | SUBLINGUAL_TABLET | SUBLINGUAL | Status: DC | PRN

## 2014-12-13 MED ORDER — SODIUM CHLORIDE 0.9 % IV SOLN
INTRAVENOUS | Status: DC
  Administered 2014-12-13 – 2014-12-17 (×2): via INTRAVENOUS

## 2014-12-13 MED ORDER — SODIUM CHLORIDE 0.9 % IJ SOLN: 3.0000 mL | INTRAMUSCULAR | Status: DC | PRN

## 2014-12-13 MED ORDER — ASPIRIN 300 MG RE SUPP: 300.0000 mg | RECTAL | Status: AC

## 2014-12-13 MED ORDER — INSULIN ASPART 100 UNIT/ML ~~LOC~~ SOLN
0.0000 [IU] | Freq: Three times a day (TID) | SUBCUTANEOUS | Status: DC
  Administered 2014-12-13: 2 [IU] via SUBCUTANEOUS
  Administered 2014-12-14 – 2014-12-15 (×2): 1 [IU] via SUBCUTANEOUS
  Administered 2014-12-15 – 2014-12-19 (×7): 2 [IU] via SUBCUTANEOUS

## 2014-12-13 MED ORDER — CARVEDILOL 3.125 MG PO TABS
6.2500 mg | ORAL_TABLET | Freq: Two times a day (BID) | ORAL | Status: DC
  Administered 2014-12-13 – 2014-12-14 (×3): 6.25 mg via ORAL
  Filled 2014-12-13 (×2): qty 1
  Filled 2014-12-13: qty 2

## 2014-12-13 MED ORDER — INSULIN GLARGINE 100 UNIT/ML ~~LOC~~ SOLN
20.0000 [IU] | Freq: Two times a day (BID) | SUBCUTANEOUS | Status: DC
Start: 2014-12-13 — End: 2014-12-19
  Administered 2014-12-13 – 2014-12-19 (×11): 20 [IU] via SUBCUTANEOUS
  Filled 2014-12-13 (×13): qty 0.2

## 2014-12-13 NOTE — Progress Notes (Addendum)
ANTIBIOTIC CONSULT NOTE - INITIAL  Pharmacy Consult for Vancomycin and Zosyn Indication: diabetic foot infection/osteo  No Known Allergies  Patient Measurements: Height: 5\' 9"  (175.3 cm) Weight: 223 lb (101.152 kg) IBW/kg (Calculated) : 70.7 Adjusted Body Weight:   Vital Signs: Temp: 97.7 F (36.5 C) (04/14 1142) Temp Source: Oral (04/14 1142) BP: 146/85 mmHg (04/14 1722) Pulse Rate: 71 (04/14 1722) Intake/Output from previous day:   Intake/Output from this shift: Total I/O In: -  Out: 1000 [Urine:1000]  Labs: No results for input(s): WBC, HGB, PLT, LABCREA, CREATININE in the last 72 hours. CrCl cannot be calculated (Patient has no serum creatinine result on file.). No results for input(s): VANCOTROUGH, VANCOPEAK, VANCORANDOM, GENTTROUGH, GENTPEAK, GENTRANDOM, TOBRATROUGH, TOBRAPEAK, TOBRARND, AMIKACINPEAK, AMIKACINTROU, AMIKACIN in the last 72 hours.   Microbiology: No results found for this or any previous visit (from the past 720 hour(s)).  Medical History: No past medical history on file.  Medications:  No prescriptions prior to admission   Scheduled:  . aspirin  324 mg Oral NOW   Or  . aspirin  300 mg Rectal NOW  . [START ON 12/14/2014] aspirin EC  81 mg Oral Daily  . atorvastatin  80 mg Oral q1800  . carvedilol  6.25 mg Oral BID WC  .  ceFAZolin (ANCEF) IV  1 g Intravenous 3 times per day  . [START ON 12/14/2014] clopidogrel  75 mg Oral Q breakfast  . furosemide  40 mg Intravenous BID  . insulin aspart  0-9 Units Subcutaneous TID WC  . insulin glargine  20 Units Subcutaneous BID  . lisinopril  10 mg Oral Daily  . sodium chloride  3 mL Intravenous Q12H   Infusions:  . sodium chloride 10 mL/hr at 12/13/14 1510  . heparin 1,250 Units/hr (12/13/14 1459)  . nitroGLYCERIN 10 mcg/min (12/13/14 1502)   Assessment: 54yo male with history of DM2 presents with SOB associated with diaphoresis. Pharmacy is consulted to dose zosyn and vancomycin for diabetic foot  infection/osteomyelitis. Pt is afebrile, WBC 11, sCr 2.  Pt was started on cefazolin and received one dose. This was discontinued to start zosyn and vanc.  Goal of Therapy:  Vancomycin trough level 15-20 mcg/ml  Plan:  Zosyn 3.375g IV q8h Vancomycin 1750mg  IV once followed by 1250mg  q24h Measure antibiotic drug levels at steady state Follow up culture results, renal function, and clinical course F/u lab results to redose antibiotics  Andrey Cota. Diona Foley, PharmD Clinical Pharmacist Pager [phone] 12/13/2014,6:23 PM

## 2014-12-13 NOTE — Care Management Note (Addendum)
    Page 1 of 1   12/19/2014     2:35:41 PM CARE MANAGEMENT NOTE 12/19/2014  Patient:  Scott [name], Scott [name]   Account Number:  [id number]  Date Initiated:  12/13/2014  Documentation initiated by:  GRAVES-BIGELOW,BRENDA  Subjective/Objective Assessment:   Pt admitted for Acute onset of shortness of breath associated with diaphoresis. Cellulitis right foot rule out osteomyelitis. Iv antibiotics initiated.     Action/Plan:   CM to monitor for disposition needs.   Anticipated DC Date:  12/19/2014   Anticipated DC Plan:  [place]  CM consult      Choice offered to / List presented to:             Status of service:  Completed, signed off Medicare Important Message given?   (If response is "NO", the following Medicare IM given date fields will be blank) Date Medicare IM given:   Medicare IM given by:   Date Additional Medicare IM given:   Additional Medicare IM given by:    Discharge Disposition:  [place]  Per UR Regulation:  Reviewed for med. necessity/level of care/duration of stay  If discussed at [place] of Stay Meetings, dates discussed:   12/20/2014    Comments:  4/20  1432 debbie Scott Silsby rn,bsn pt disch home. he had effient 30day free card. md did not sign pt assist form. ahc getting orders from dr at [employer]. they will ck w that md about resuming hhc. enc them to ck on pt as went home w effient and to be sure pt follows up on antiplatelet med. spoke w donna w [facility] about above. had left pt inform on [facility] several days ago.  4/18  1235 debbie Scott Winegardner rn,bsn pt sleeping on my visit. left pt 30day free effeint card. left pt [facility]. no ins or pcp listed. left effient pt assist form in shadow chart.  12/14/2014 1200 Chart reviewed. Utilization review complete. Scott Finner RN CCM Case Mgmt phone [phone]

## 2014-12-13 NOTE — Consult Note (Signed)
Reason for Consult:osteomyelitis right foot Referring Physician: cardiology  Scott [name] is an 54 y.o. male.  HPI: the patient is a 54 year old male with the history of significant cardiac problems who was transferred down here because of cardiac problems.  He had amputation several weeks ago of his 4th toe and metatarsal head.  He has been following up with his surgeon ay home.  He was noted to have drainage and pain and swelling in his right foot.  We are consult for management.the patient has had a previous amputation for present diabetic problems.  Past Medical History  Diagnosis Date  . Non-Q wave myocardial infarction     /notes 12/13/2014  . Coronary artery disease     [name] 12/13/2014  . Chronic kidney disease (CKD), stage IV (severe)     [name] 12/13/2014  . Chronic disease anemia     [name] 12/13/2014  . Type II diabetes mellitus   . High cholesterol     [name] 12/13/2014  . PVD (peripheral vascular disease)     [name] 12/13/2014  . Dysrhythmia   . CHF (congestive heart failure)   . GERD (gastroesophageal reflux disease)   . Arthritis     "left arm; right leg" (12/13/2014)  . Depression     Past Surgical History  Procedure Laterality Date  . Toe amputation Right 2015    4th toe  . Incision and drainage of wound Right ~ 10/2014    "4th toe foot"  . Cardiac catheterization  11/2014    History reviewed. No pertinent family history.  Social History:  reports that he has been smoking Cigarettes.  He has a 10 pack-year smoking history. He has never used smokeless tobacco. He reports that he does not drink alcohol or use illicit drugs.  Allergies: No Known Allergies  Medications: I have reviewed the patient's current medications.  Results for orders placed or performed during the hospital encounter of 12/13/14 (from the past 48 hour(s))  Glucose, capillary     Status: Abnormal   Collection Time: 12/13/14 11:56 AM  Result Value Ref Range   Glucose-Capillary 158 (H) 70  - 99 mg/dL  Glucose, capillary     Status: Abnormal   Collection Time: 12/13/14  4:45 PM  Result Value Ref Range   Glucose-Capillary 177 (H) 70 - 99 mg/dL  Brain natriuretic peptide     Status: Abnormal   Collection Time: 12/13/14  6:03 PM  Result Value Ref Range   B Natriuretic Peptide 272.2 (H) 0.0 - 100.0 pg/mL  Troponin I-(serum)     Status: Abnormal   Collection Time: 12/13/14  6:03 PM  Result Value Ref Range   Troponin I 0.10 (H) <0.031 ng/mL    Comment:        PERSISTENTLY INCREASED TROPONIN VALUES IN THE RANGE OF 0.04-0.49 ng/mL CAN BE SEEN IN:       -UNSTABLE ANGINA       -CONGESTIVE HEART FAILURE       -MYOCARDITIS       -CHEST TRAUMA       -ARRYHTHMIAS       -LATE PRESENTING MYOCARDIAL INFARCTION       -COPD   CLINICAL FOLLOW-UP RECOMMENDED.   CBC     Status: Abnormal   Collection Time: 12/13/14  6:03 PM  Result Value Ref Range   WBC 11.0 (H) 4.0 - 10.5 K/uL   RBC 3.15 (L) 4.22 - 5.81 MIL/uL   Hemoglobin 9.1 (L) 13.0 - 17.0 g/dL  HCT 28.7 (L) 39.0 - 52.0 %   MCV 91.1 78.0 - 100.0 fL   MCH 28.9 26.0 - 34.0 pg   MCHC 31.7 30.0 - 36.0 g/dL   RDW 14.8 11.5 - 15.5 %   Platelets 419 (H) 150 - 400 K/uL  Comprehensive metabolic panel     Status: Abnormal   Collection Time: 12/13/14  6:03 PM  Result Value Ref Range   Sodium 138 135 - 145 mmol/L   Potassium 4.5 3.5 - 5.1 mmol/L   Chloride 109 96 - 112 mmol/L   CO2 18 (L) 19 - 32 mmol/L   Glucose, Bld 237 (H) 70 - 99 mg/dL   BUN 24 (H) 6 - 23 mg/dL   Creatinine, Ser 2.04 (H) 0.50 - 1.35 mg/dL   Calcium 7.9 (L) 8.4 - 10.5 mg/dL   Total Protein 5.8 (L) 6.0 - 8.3 g/dL   Albumin 2.3 (L) 3.5 - 5.2 g/dL   AST 12 0 - 37 U/L   ALT 10 0 - 53 U/L   Alkaline Phosphatase 107 39 - 117 U/L   Total Bilirubin 0.4 0.3 - 1.2 mg/dL   GFR calc non Af Amer 35 (L) >90 mL/min   GFR calc Af Amer 40 (L) >90 mL/min    Comment: (NOTE) The eGFR has been calculated using the CKD EPI equation. This calculation has not been validated in  all clinical situations. eGFR's persistently <90 mL/min signify possible Chronic Kidney Disease.    Anion gap 11 5 - 15  Magnesium     Status: None   Collection Time: 12/13/14  6:03 PM  Result Value Ref Range   Magnesium 2.0 1.5 - 2.5 mg/dL  Troponin I     Status: Abnormal   Collection Time: 12/13/14  6:03 PM  Result Value Ref Range   Troponin I 0.11 (H) <0.031 ng/mL    Comment:        PERSISTENTLY INCREASED TROPONIN VALUES IN THE RANGE OF 0.04-0.49 ng/mL CAN BE SEEN IN:       -UNSTABLE ANGINA       -CONGESTIVE HEART FAILURE       -MYOCARDITIS       -CHEST TRAUMA       -ARRYHTHMIAS       -LATE PRESENTING MYOCARDIAL INFARCTION       -COPD   CLINICAL FOLLOW-UP RECOMMENDED.   TSH     Status: None   Collection Time: 12/13/14  6:03 PM  Result Value Ref Range   TSH 2.107 0.350 - 4.500 uIU/mL  Glucose, capillary     Status: Abnormal   Collection Time: 12/13/14  8:11 PM  Result Value Ref Range   Glucose-Capillary 207 (H) 70 - 99 mg/dL  Heparin level (unfractionated)     Status: Abnormal   Collection Time: 12/13/14  8:30 PM  Result Value Ref Range   Heparin Unfractionated 0.18 (L) 0.30 - 0.70 IU/mL    Comment:        IF HEPARIN RESULTS ARE BELOW EXPECTED VALUES, AND PATIENT DOSAGE HAS BEEN CONFIRMED, SUGGEST FOLLOW UP TESTING OF ANTITHROMBIN III LEVELS.     Dg Foot 2 Views Right  12/13/2014   CLINICAL DATA:  Cellulitis. Pain and swelling right foot. Previous surgeries. Diabetes.  EXAM: RIGHT FOOT - 2 VIEW  COMPARISON:  None.  FINDINGS: There has been a dictation distal to the distal aspect fourth metatarsal. There is osteolysis involving the head of the third metatarsal with minimal adjacent periosteal reaction. Subtle ill definition of  the cortex of the base of the fourth proximal phalanx. There is increased lucency irregularity at the base of the second and third metatarsals suggesting subacute to chronic fractures. Mild degenerate change of the midfoot. Moderate inferior  calcaneal spur. Small vessel atherosclerotic disease is present.  IMPRESSION: Osteolysis of the head of the third metatarsal and possibly base of the third proximal phalanx likely osteomyelitis.  Lucency and irregularity involving the base of the second and third metatarsals suggesting a subacute to chronic fracture.  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   Electronically Signed   By: Marin Olp M.D.   On: 12/13/2014 13:29    ROS  ROS: I have reviewed the patient's review of systems thoroughly and there are no positive responses as relates to the HPI.  Blood pressure 148/82, pulse 71, temperature 98 F (36.7 C), temperature source Oral, resp. rate 18, height _0  (1.753 m), weight 223 lb (101.152 kg), SpO2 100 %. Physical Exam Well-developed well-nourished patient in no acute distress. Alert and oriented x3 HEENT:within normal limits Cardiac: Regular rate and rhythm Pulmonary: Lungs clear to auscultation Abdomen: Soft and nontender.  Normal active bowel sounds  Musculoskeletal: (right foot minimal area of drainage over the third metatarsal base.  There is minimal pain with range of motion.  There is minimal erythema on the dorsum of the foot.  Assessment/Plan: 54 year old male with history of previous amputation for presumed osteomyelitis.  Patient presents with new onset ulceration and drainage from the foot.  He is currently having significant cardiac issues and is going to undergo cardiac catheterization and possible stent placement.  At this point the patient needs elevation of the foot with antibiotic therapy. Pt will not need any foot treatmernt other than IV abx on this visit.  He can be discharged on oral Abx and follow up with his team that has been treating him.   We will follow patient in the hospital.  Latiqua Daloia L 12/13/2014, 11:20 PM

## 2014-12-13 NOTE — Progress Notes (Signed)
  Echocardiogram 2D Echocardiogram has been performed.  Diamond Nickel 12/13/2014, 2:31 PM

## 2014-12-13 NOTE — Progress Notes (Addendum)
ANTICOAGULATION CONSULT NOTE - Initial Consult  Pharmacy Consult for heparin Indication: chest pain/ACS  Allergies not on file  Patient Measurements: Weight: 101.36kg Heparin dosing wt: 92kg Height: 5\' 9"  (175.3 cm) IBW/kg (Calculated) : 70.7    Vital Signs: Temp: 97.7 F (36.5 C) (04/14 1142) Temp Source: Oral (04/14 1142) BP: 159/81 mmHg (04/14 1142) Pulse Rate: 73 (04/14 1142)  Labs: No results for input(s): HGB, HCT, PLT, APTT, LABPROT, INR, HEPARINUNFRC, CREATININE, CKTOTAL, CKMB, TROPONINI in the last 72 hours.  CrCl cannot be calculated (Unknown ideal weight.).    Assessment: 54 yo male here with SOB and history of recent MI and CAD. Pharmacy consulted to start heparin for r/o ACS.   Goal of Therapy:  Heparin level 0.3-0.7 units/ml Monitor platelets by anticoagulation protocol: Yes   Plan:  -Heparin bolus 4000 units IV followed by 1250 units/hr (~ 14units/kg/hr) -Heparin level in 6 hours and daily wth CBC daily  Hildred Laser, Pharm D 12/13/2014 2:33 PM

## 2014-12-13 NOTE — Progress Notes (Signed)
ANTICOAGULATION CONSULT NOTE - Initial Consult  Pharmacy Consult for heparin Indication: chest pain/ACS  No Known Allergies  Patient Measurements: Heparin dosing wt: 92kg Height: 5\' 9"  (175.3 cm) Weight: 223 lb (101.152 kg) IBW/kg (Calculated) : 70.7    Vital Signs: Temp: 98 F (36.7 C) (04/14 2010) Temp Source: Oral (04/14 2010) BP: 148/82 mmHg (04/14 2010) Pulse Rate: 71 (04/14 2010)  Labs:  Recent Labs  12/13/14 1803 12/13/14 2030  HGB 9.1*  --   HCT 28.7*  --   PLT 419*  --   HEPARINUNFRC  --  0.18*  CREATININE 2.04*  --   TROPONINI 0.10*  0.11*  --     Estimated Creatinine Clearance: 47.4 mL/min (by C-G formula based on Cr of 2.04).    Assessment: 54 yo male here with SOB and history of recent MI and CAD. Pharmacy consulted to start heparin for r/o ACS.   Initial HL is SUBtherapeutic at 0.18 on heparin 1250 units/hr. Nurse reports no issues with infusion or bleeding.  Goal of Therapy:  Heparin level 0.3-0.7 units/ml Monitor platelets by anticoagulation protocol: Yes   Plan:  -Heparin bolus 2000 units IV followed by 1600 units/hr -Daily HL/CBC Monitor s/sx of bleeding  Andrey Cota. Diona Foley, PharmD Clinical Pharmacist Pager [phone]  12/13/2014 9:38 PM

## 2014-12-13 NOTE — H&P (Signed)
Scott [name] is an 54 y.o. male.   Chief Complaint: Acute onset of shortness of breath associated with diaphoresis HPI: Patient is 54 year old male with past medical history significant for coronary artery disease status post recent non-Q-wave myocardial infarction noted to have three-vessel CAD with markedly depressed LV systolic function approximately 2 weeks ago, hypertension, diabetes mellitus, chronic kidney disease, tobacco abuse, strong family history of coronary artery disease anemia of chronic disease, peripheral vascular disease, status post amputation of the fourth toe right foot with chronic right foot swelling and minimal drainage from the ulcer, went to [facility] ER because of sudden onset of shortness of breath associated with diaphoresis. Patient denies any chest pain nausea vomiting. Patient does give history of PND orthopnea and leg swelling. Denies palpitation lightheadedness or syncope. Her states he had similar presentation approximately 10 days ago and was ruled in for non-Q-wave myocardial infarction subsequently requiring cardiac catheterization and was noted to have multivessel distal CAD and was treated medically. Patient states  since his discharge his breathing has become progressively worse and in last Friday: Hardly breathe so decided to go to the ED. Reviewed his angiogram done approximately 10 days ago that showed critical distal LAD, left circumflex and PDA diffuse disease with no good distal targets for bypass.  No past medical history on file.  No past surgical history on file.  No family history on file. Social History:  has no tobacco, alcohol, and drug history on file.  Allergies: Allergies not on file  No prescriptions prior to admission    No results found for this or any previous visit (from the past 48 hour(s)). No results found.  Review of Systems  Constitutional: Positive for diaphoresis. Negative for fever and chills.  HENT:  Negative for hearing loss.   Eyes: Negative for double vision and photophobia.  Respiratory: Positive for shortness of breath. Negative for cough, hemoptysis and sputum production.   Cardiovascular: Positive for orthopnea, leg swelling and PND. Negative for chest pain and palpitations.  Gastrointestinal: Negative for nausea, vomiting and abdominal pain.  Genitourinary: Negative for dysuria.  Neurological: Negative for dizziness and headaches.    Blood pressure 159/81, pulse 73, temperature 97.7 F (36.5 C), temperature source Oral, resp. rate 18, SpO2 100 %. Physical Exam  HENT:  Head: Normocephalic and atraumatic.  Eyes: Conjunctivae are normal. Pupils are equal, round, and reactive to light. Left eye exhibits no discharge. No scleral icterus.  Neck: Normal range of motion. Neck supple. JVD present. No thyromegaly present.  Cardiovascular: Normal rate and regular rhythm.   Murmur (Soft systolic murmur and S3 gallop noted) heard. Respiratory:  Decreased breath sound at bases with by basilar Rales noted  GI: Soft. Bowel sounds are normal. He exhibits no distension. There is no tenderness. There is no rebound.  Musculoskeletal:  No clubbing cyanosis trace edema left leg 2+ edema right leg with erythema and minimal drainage from surgical scar noted     Assessment/Plan Acute on chronic decompensated systolic congestive heart failure Recent non-Q-wave myocardial infarction Multivessel coronary artery disease Hypertension Insulin requiring diabetes mellitus Chronic kidney disease stage IV Anemia of chronic disease Peripheral vascular disease Cellulitis right foot rule out osteomyelitis Tobacco abuse Hypercholesteremia Positive family history of coronary artery disease Plan As per orders Discussed with patient at length regarding staged PCI once he is fully compensated his risk and benefits i.e. death MI stroke need for emergency CABG local vascular complications worsening renal  function requiring hemodialysis etc. and consents for  PCI  [facility] N 12/13/2014, 11:55 AM

## 2014-12-13 NOTE — Progress Notes (Signed)
I was consulted today for management of foot ulceration.  I placed the patient on appropriate antibiosis and I will write a full consult on him tomorrow.  Ultimately initial treatment will be elevation and IV antibiotic therapy.  He may or may not need surgical intervention on delayed basis.  He obviously needs to get his heart situation clarified prior to any treatment of his foot.

## 2014-12-14 ENCOUNTER — Encounter (HOSPITAL_COMMUNITY): Payer: Self-pay | Admitting: Cardiology

## 2014-12-14 ENCOUNTER — Encounter (HOSPITAL_COMMUNITY)
Admission: AD | Disposition: A | Discharge status: Home / Self Care | Payer: Self-pay | Source: Other Acute Inpatient Hospital | Attending: Cardiology

## 2014-12-14 HISTORY — PX: PERCUTANEOUS CORONARY STENT INTERVENTION (PCI-S): SHX5485

## 2014-12-14 LAB — CBC
HEMATOCRIT: 26.6 % — AB (ref 39.0–52.0)
HEMOGLOBIN: 8.7 g/dL — AB (ref 13.0–17.0)
MCH: 29.1 pg (ref 26.0–34.0)
MCHC: 32.7 g/dL (ref 30.0–36.0)
MCV: 89 fL (ref 78.0–100.0)
Platelets: 455 10*3/uL — ABNORMAL HIGH (ref 150–400)
RBC: 2.99 MIL/uL — AB (ref 4.22–5.81)
RDW: 14.7 % (ref 11.5–15.5)
WBC: 9.8 10*3/uL (ref 4.0–10.5)

## 2014-12-14 LAB — GLUCOSE, CAPILLARY
GLUCOSE-CAPILLARY: 87 mg/dL (ref 70–99)
GLUCOSE-CAPILLARY: 126 mg/dL — AB (ref 70–99)
Glucose-Capillary: 123 mg/dL — ABNORMAL HIGH (ref 70–99)
Glucose-Capillary: 101 mg/dL — ABNORMAL HIGH (ref 70–99)

## 2014-12-14 LAB — TROPONIN I: TROPONIN I: 0.08 ng/mL — AB (ref <0.031)

## 2014-12-14 LAB — PROTIME-INR
INR: 1.1 (ref 0.00–1.49)
PROTHROMBIN TIME: 14.3 s (ref 11.6–15.2)

## 2014-12-14 LAB — BASIC METABOLIC PANEL
Anion gap: 10 (ref 5–15)
BUN: 23 mg/dL (ref 6–23)
CALCIUM: 8.1 mg/dL — AB (ref 8.4–10.5)
CO2: 22 mmol/L (ref 19–32)
CREATININE: 1.97 mg/dL — AB (ref 0.50–1.35)
Chloride: 109 mmol/L (ref 96–112)
GFR calc Af Amer: 42 mL/min — ABNORMAL LOW (ref >90)
GFR calc non Af Amer: 36 mL/min — ABNORMAL LOW (ref >90)
Glucose, Bld: 147 mg/dL — ABNORMAL HIGH (ref 70–99)
Potassium: 3.8 mmol/L (ref 3.5–5.1)
Sodium: 141 mmol/L (ref 135–145)

## 2014-12-14 LAB — LIPID PANEL
CHOL/HDL RATIO: 2.7 ratio
CHOLESTEROL: 100 mg/dL (ref 0–200)
HDL: 37 mg/dL — ABNORMAL LOW (ref >39)
LDL Cholesterol: 43 mg/dL (ref 0–99)
TRIGLYCERIDES: 98 mg/dL (ref <150)
VLDL: 20 mg/dL (ref 0–40)

## 2014-12-14 LAB — POCT ACTIVATED CLOTTING TIME: Activated Clotting Time: 349 seconds

## 2014-12-14 LAB — HEPARIN LEVEL (UNFRACTIONATED): HEPARIN UNFRACTIONATED: 0.29 [IU]/mL — AB (ref 0.30–0.70)

## 2014-12-14 SURGERY — PERCUTANEOUS CORONARY STENT INTERVENTION (PCI-S)
Anesthesia: LOCAL

## 2014-12-14 MED ORDER — PRASUGREL HCL 10 MG PO TABS
ORAL_TABLET | ORAL | Status: AC
  Filled 2014-12-14: qty 1

## 2014-12-14 MED ORDER — SODIUM CHLORIDE 0.9 % IV SOLN
INTRAVENOUS | Status: AC
Start: 2014-12-14 — End: 2014-12-15
  Administered 2014-12-14: 16:00:00 via INTRAVENOUS

## 2014-12-14 MED ORDER — HEPARIN (PORCINE) IN NACL 2-0.9 UNIT/ML-% IJ SOLN
INTRAMUSCULAR | Status: AC
  Filled 2014-12-14: qty 500

## 2014-12-14 MED ORDER — BIVALIRUDIN 250 MG IV SOLR
INTRAVENOUS | Status: AC
  Filled 2014-12-14: qty 250

## 2014-12-14 MED ORDER — NITROGLYCERIN 1 MG/10 ML FOR IR/CATH LAB
INTRA_ARTERIAL | Status: AC
  Filled 2014-12-14: qty 10

## 2014-12-14 MED ORDER — PRASUGREL HCL 10 MG PO TABS
10.0000 mg | ORAL_TABLET | Freq: Every day | ORAL | Status: DC
  Filled 2014-12-14: qty 1

## 2014-12-14 MED ORDER — ASPIRIN 81 MG PO CHEW: 81.0000 mg | CHEWABLE_TABLET | Freq: Every day | ORAL | Status: DC

## 2014-12-14 MED ORDER — NITROGLYCERIN IN D5W 200-5 MCG/ML-% IV SOLN
10.0000 ug/min | INTRAVENOUS | Status: DC
  Administered 2014-12-14: 21:00:00 163.333 ug/min via INTRAVENOUS
  Administered 2014-12-15: 05:00:00 113.333 ug/min via INTRAVENOUS
  Filled 2014-12-14 (×2): qty 250

## 2014-12-14 MED ORDER — SODIUM BICARBONATE BOLUS VIA INFUSION
INTRAVENOUS | Status: AC
  Administered 2014-12-14: 310 meq via INTRAVENOUS
  Filled 2014-12-14: qty 1

## 2014-12-14 MED ORDER — FENTANYL CITRATE (PF) 100 MCG/2ML IJ SOLN
INTRAMUSCULAR | Status: AC
  Filled 2014-12-14: qty 2

## 2014-12-14 MED ORDER — SODIUM BICARBONATE BOLUS VIA INFUSION
INTRAVENOUS | Status: DC
  Filled 2014-12-14: qty 1

## 2014-12-14 MED ORDER — SODIUM BICARBONATE 8.4 % IV SOLN
INTRAVENOUS | Status: AC
  Administered 2014-12-14: 13:00:00 via INTRAVENOUS
  Filled 2014-12-14: qty 500

## 2014-12-14 MED ORDER — PRASUGREL HCL 10 MG PO TABS
10.0000 mg | ORAL_TABLET | Freq: Every day | ORAL | Status: DC
  Administered 2014-12-15 – 2014-12-19 (×5): 10 mg via ORAL
  Filled 2014-12-14 (×6): qty 1

## 2014-12-14 MED ORDER — ANGIOPLASTY BOOK
Freq: Once | Status: AC
  Administered 2014-12-14: 21:00:00
  Filled 2014-12-14: qty 1

## 2014-12-14 MED ORDER — DEXTROSE 5 % IV SOLN: INTRAVENOUS | Status: DC

## 2014-12-14 MED ORDER — SODIUM BICARBONATE 8.4 % IV SOLN
INTRAVENOUS | Status: AC
  Administered 2014-12-14: 13:00:00 via INTRAVENOUS
  Filled 2014-12-14: qty 1000

## 2014-12-14 MED ORDER — LIDOCAINE HCL (PF) 1 % IJ SOLN
INTRAMUSCULAR | Status: AC
  Filled 2014-12-14: qty 30

## 2014-12-14 MED ORDER — HEPARIN (PORCINE) IN NACL 2-0.9 UNIT/ML-% IJ SOLN
INTRAMUSCULAR | Status: AC
  Filled 2014-12-14: qty 1000

## 2014-12-14 MED ORDER — NITROGLYCERIN IN D5W 200-5 MCG/ML-% IV SOLN: 10.0000 ug/min | INTRAVENOUS | Status: DC

## 2014-12-14 MED ORDER — MIDAZOLAM HCL 2 MG/2ML IJ SOLN
INTRAMUSCULAR | Status: AC
  Filled 2014-12-14: qty 2

## 2014-12-14 MED ORDER — ONDANSETRON HCL 4 MG/2ML IJ SOLN: 4.0000 mg | Freq: Four times a day (QID) | INTRAMUSCULAR | Status: DC | PRN

## 2014-12-14 MED ORDER — HYDRALAZINE HCL 20 MG/ML IJ SOLN
10.0000 mg | INTRAMUSCULAR | Status: AC | PRN
  Administered 2014-12-14 (×2): 10 mg via INTRAVENOUS
  Filled 2014-12-14 (×2): qty 1

## 2014-12-14 MED ORDER — LISINOPRIL 20 MG PO TABS
20.0000 mg | ORAL_TABLET | Freq: Every day | ORAL | Status: DC
  Administered 2014-12-15 – 2014-12-16 (×2): 20 mg via ORAL
  Filled 2014-12-14 (×2): qty 1

## 2014-12-14 MED ORDER — ACETAMINOPHEN 325 MG PO TABS: 650.0000 mg | ORAL_TABLET | ORAL | Status: DC | PRN

## 2014-12-14 MED ORDER — SODIUM BICARBONATE 8.4 % IV SOLN: INTRAVENOUS | Status: DC

## 2014-12-14 MED ORDER — CARVEDILOL 12.5 MG PO TABS
12.5000 mg | ORAL_TABLET | Freq: Two times a day (BID) | ORAL | Status: DC
  Administered 2014-12-15 – 2014-12-18 (×8): 12.5 mg via ORAL
  Filled 2014-12-14 (×9): qty 1

## 2014-12-14 MED ORDER — LIVING WELL WITH DIABETES BOOK
Freq: Once | Status: AC
  Administered 2014-12-14: 21:00:00
  Filled 2014-12-14: qty 1

## 2014-12-14 NOTE — Cardiovascular Report (Signed)
Scott [name], Scott [name]              ACCOUNT NO.:  [id number]  MEDICAL RECORD NO.:  [id number]  LOCATION:  6C07C                        FACILITY:  [facility]  PHYSICIAN:  Camil Hausmann N. Terrence Dupont, M.D. DATE OF BIRTH:  Jun 16, 1961  DATE OF PROCEDURE:  12/14/2014 DATE OF DISCHARGE:                           CARDIAC CATHETERIZATION   PROCEDURE: 1. Successful PTCA to proximal and distal LAD using 2.5 x 12 mm long     Emerge balloon. 2. Successful deployment of 2.5 x 23 mm long Xience Alpine drug-     eluting stent in distal LAD. 3. Successful postdilatation of this stent using same balloon going up     to 13 atmospheric pressure. 4. Successful deployment of 3.0 x 28 mm long Xience Alpine drug-     eluting stent in proximal LAD. 5. Successful postdilatation of this stent using 3.0 x 20 mm long [place]     Emerge balloon going up to 18 atmospheric pressure.  INDICATION FOR THE PROCEDURE:  Scott [name] is 54 year old male with past medical history significant for coronary artery disease, status post recent non-Q-wave myocardial infarction noted to have 3 vessel CAD with markedly depressed LV systolic function approximately 2 weeks ago, hypertension, diabetes mellitus, chronic kidney disease, tobacco abuse, strong family history of coronary artery disease, anemia of chronic disease, peripheral vascular disease, status post amputation of the 4th toe of the right foot with chronic right foot swelling and minimal drainage from the ulcer. Went to [facility] because of sudden onset of shortness of breath associated with diaphoresis. The patient denies any chest pain, nausea, vomiting.  The patient states he had similar presentation about 2 weeks ago when he was noted to have non- Q-wave myocardial infarction.  The patient does give history of PND, orthopnea, and leg swelling.  Denies palpitation, lightheadedness, or syncope.  The patient subsequently approximately 10 days ago underwent cardiac  catheterization and was noted to have multivessel CAD, and was treated medically.  The patient states since his hospital discharge, his breathing has gone progressively worse and he is  hardly able to breathe.  Reviewed his angiogram 10 days done prior which showed critical distal and proximal LAD stenosis, critical proximal left circumflex, and obtuse marginal bifurcation stenosis, and diffuse PDA disease with no good targets for bypass.  Discussed with patient regarding staged PCI, its risks and benefits, i.e., death, MI, stroke, need for emergency CABG, local vascular complications, etc. and consented for the procedure.  DESCRIPTION OF PROCEDURE:  After obtaining the informed consent, the patient was brought to the cath lab and was placed on fluoroscopy table. Right groin was prepped and draped in usual fashion.  1% Xylocaine was used for local anesthesia in the right groin.  With the help of thin wall needle, 6-French arterial sheath was placed.  The sheath was aspirated and flushed.  Next, 6-French left Judkins catheter was advanced over the wire under fluoroscopic guidance up to the ascending aorta.  Wire was pulled out.  The catheter was aspirated and connected to the Manifold.  Catheter was further advanced and engaged into the left coronary ostium.  A single view of the left system was obtained.  FINDINGS:  Left main has  distal 30-40% stenosis, LAD has 70-75% proximal sequential stenosis and 80-85% distal stenosis.  INTERVENTIONAL PROCEDURE:  Successful PTCA to distal and proximal LAD was done using 2.5 x 12 mm long Emerge balloon for predilatation and then 2.5 x 23 mm long Xience Alpine drug-eluting stent was deployed at 11 atmospheric pressure in distal RCA.  This stent was post dilated using same balloon going up to 13 atmospheric pressure and then 3.0 x 28 mm long Xience Alpine drug-eluting stent was deployed in proximal LAD at 11 atmospheric pressure.  This stent was post  dilated using 3.0 x 20 mm long [place] Emerge balloon going up to 18 atmospheric pressure.  Lesions dilated from 70-85% to 0% residual with excellent TIMI grade 3 distal flow without evidence of dissection or distal embolization. The patient received weight based Angiomax and 60 mg of prasugrel prior to the procedure.  The patient tolerated the procedure well. There no complications. The patient was transferred to recovery room in stable condition.     Allegra Lai. Terrence Dupont, M.D.     [facility]/MEDQ  D:  12/14/2014  T:  12/14/2014  Job:  [id number]

## 2014-12-14 NOTE — Progress Notes (Signed)
ANTICOAGULATION CONSULT NOTE - Follow Up Consult  Pharmacy Consult for heparin Indication: chest pain/ACS  No Known Allergies  Patient Measurements: Height: 5\' 9"  (175.3 cm) Weight: 220 lb 7.4 oz (100 kg) IBW/kg (Calculated) : 70.7 Heparin Dosing Weight: 92kg  Vital Signs: Temp: 97.8 F (36.6 C) (04/15 0400) Temp Source: Oral (04/15 0400) BP: 145/68 mmHg (04/15 0400) Pulse Rate: 73 (04/15 0400)  Labs:  Recent Labs  12/13/14 1803 12/13/14 2030 12/14/14 0124 12/14/14 0444  HGB 9.1*  --   --  8.7*  HCT 28.7*  --   --  26.6*  PLT 419*  --   --  455*  LABPROT  --   --   --  14.3  INR  --   --   --  1.10  HEPARINUNFRC  --  0.18*  --  0.29*  CREATININE 2.04*  --   --  1.97*  TROPONINI 0.10*  0.11*  --  0.08*  --     Estimated Creatinine Clearance: 48.8 mL/min (by C-G formula based on Cr of 1.97).   Medications:  Scheduled:  . aspirin  324 mg Oral NOW   Or  . aspirin  300 mg Rectal NOW  . aspirin EC  81 mg Oral Daily  . atorvastatin  80 mg Oral q1800  . carvedilol  6.25 mg Oral BID WC  . clopidogrel  75 mg Oral Q breakfast  . furosemide  40 mg Intravenous BID  . insulin aspart  0-9 Units Subcutaneous TID WC  . insulin glargine  20 Units Subcutaneous BID  . lisinopril  10 mg Oral Daily  . piperacillin-tazobactam (ZOSYN)  IV  3.375 g Intravenous Q8H  . [START ON 12/15/2014] sodium bicarbonate   Intravenous Pre-Cath   And  . [START ON 12/15/2014] sodium bicarbonate (CIN) infusion   Intravenous Pre-Cath   And  . [START ON 12/15/2014] sodium bicarbonate (CIN) infusion   Intravenous Pre-Cath  . sodium chloride  3 mL Intravenous Q12H  . vancomycin  1,250 mg Intravenous Q24H   Infusions:  . sodium chloride 10 mL/hr at 12/13/14 1510  . sodium chloride 50 mL/hr at 12/14/14 0605  . heparin 1,600 Units/hr (12/14/14 0308)  . nitroGLYCERIN 10 mcg/min (12/13/14 1502)    Assessment: 54 yo male here with SOB and history of recent MI and CAD. Pharmacy consulted to start  heparin for r/o ACS. Heparin level is 0.29 and slightly below goal. Patient noted for cath today.  Goal of Therapy:  Heparin level 0.3-0.7 units/ml Monitor platelets by anticoagulation protocol: Yes   Plan:  -Increase heparin to 1700 units/hr -Will follow plans post cath  Hildred Laser, Pharm D 12/14/2014 7:52 AM

## 2014-12-14 NOTE — Progress Notes (Signed)
12/14/2014 1200 Chart reviewed. Utilization review complete. Jonnie Finner RN CCM Case Mgmt phone [phone]

## 2014-12-14 NOTE — Progress Notes (Signed)
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 Patient denies any chest pain nausea vomiting. Patient does give history of PND orthopnea and leg swelling. Denies palpitation lightheadedness or syncope. He states he had similar presentation approximately 10 days ago and was ruled in for non-Q-wave myocardial infarction subsequently requiring cardiac catheterization and was noted to have multivessel distal CAD and was treated medically. Patient states since his discharge his breathing has become progressively worse and last Friday--Hardly able to breathe so decided to go to the ED. Reviewed his angiogram done approximately 10 days ago that showed critical distal LAD, left circumflex and PDA diffuse disease with no good distal targets for bypass.   Past Medical History  Diagnosis Date  . Non-Q wave myocardial infarction     /notes 12/13/2014  . Coronary artery disease     Scott [name] 12/13/2014  . Chronic kidney disease (CKD), stage IV (severe)     Scott [name] 12/13/2014  . Chronic disease anemia     Scott [name] 12/13/2014  . Type II diabetes mellitus   . High cholesterol     Scott [name] 12/13/2014  . PVD (peripheral vascular disease)     Scott [name] 12/13/2014  . Dysrhythmia   . CHF (congestive heart failure)   . GERD (gastroesophageal reflux disease)   .  Arthritis     "left arm; right leg" (12/13/2014)  . Depression     History   Social History  . Marital Status: Divorced    Spouse Name: N/A  . Number of Children: N/A  . Years of Education: N/A   Social History Main Topics  . Smoking status: Current Every Day Smoker -- 0.25 packs/day for 40 years    Types: Cigarettes  . Smokeless tobacco: Never Used  . Alcohol Use: No  . Drug Use: No  . Sexual Activity: Not Currently   Other Topics Concern  . None   Social History Narrative  . None    ECHO:Study Conclusions--12/13/14  - Left ventricle: The cavity size was normal. There was mild concentric hypertrophy. Systolic function was moderately reduced. The estimated ejection fraction was in the range of 35% to 40%. Diffuse hypokinesis. Possible moderate hypokinesis of the basalinferior myocardium. Doppler parameters are consistent with abnormal left ventricular relaxation (grade 1 diastolic dysfunction). - Left atrium: The atrium was mildly dilated. - Atrial septum: No defect or patent foramen ovale was identified.  Transthoracic echocardiography. M-mode, complete 2D, spectral Doppler, and color Doppler. Birthdate: Patient birthdate: 01/13/1961. Age: Patient is 54 yr old. Sex: Gender: male. BMI: 33.4 kg/m^2. Blood pressure:   159/81 Patient status: Inpatient. Study date: Study date: 12/13/2014. Study time: 02:09 PM. Location: Echo laboratory.  BNP    Component Value Date/Time   BNP 272.2* 12/13/2014 1803    ProBNP No results found for: PROBNP   Education Assessment and Provision:  Detailed education and instructions provided on heart failure disease management including the following:  Signs and symptoms of Heart Failure When to call  the physician Importance of daily weights Low sodium diet Fluid restriction Medication management Anticipated future follow-up appointments  Patient education given on each of the above topics.  Patient  acknowledges understanding and acceptance of all instructions.  I spoke with Scott [name] regarding his HF.  He admits that he does not eat a low sodium diet.  We reviewed low sodium diet and high sodium foods to avoid.  He tells me he has a scale however has not been weighing daily.  I reinforced the importance of daily weights and how to use them as tool in regards to signs and symptoms of HF.  He has noticed some swelling in his legs--yet thought it was related to his foot infection "problem".   He tells me he gets free meds at the [facility] "med clinic".  He goes to [facility] when he has a "problem".  I have asked that he establish a PCP and he agrees to come to the [facility] and says he can get there with no issue.  He really needs to establish care with a cardiologist outpatient as well--however says he can't afford it.  I will check in on him on Monday if he remains in the hospital to reinforce education.  Education Materials:  "Living Better With Heart Failure" Booklet, Daily Weight Tracker Tool and Heart Failure Educational Video.   High Risk Criteria for Readmission and/or Poor Patient Outcomes:   EF <30%- no 35-40% with grade 1 dias dys  2 or more admissions in 6 months-yes   Difficult social situation- yes--no income and no insurance--lives with son and girlfriend  Demonstrates medication noncompliance- No    Barriers of Care:  Low Health Literacy, Knowledge of HF and compliance   Discharge Planning:   Plans to return home with son and girlfriend

## 2014-12-14 NOTE — Progress Notes (Signed)
Site area: right groin  Site Prior to Removal:  Level 0  Pressure Applied For 15 MINUTES    Minutes Beginning at 2208  Manual:   Yes.    Patient Status During Pull:  WNL  Post Pull Groin Site:  Level 0  Post Pull Instructions Given:  Yes.    Post Pull Pulses Present:  Yes.    Dressing Applied:  Yes.    Comments:  Patient tolerated well.

## 2014-12-14 NOTE — Interval H&P Note (Signed)
Cath Lab Visit (complete for each Cath Lab visit)  Clinical Evaluation Leading to the Procedure:   ACS: Yes.    Non-ACS:    Anginal Classification: CCS IV  Anti-ischemic medical therapy: Maximal Therapy (2 or more classes of medications)  Non-Invasive Test Results: No non-invasive testing performed  Prior CABG: No previous CABG      History and Physical Interval Note:  12/14/2014 1:44 PM  Scott [name]  has presented today for surgery, with the diagnosis of cp  The various methods of treatment have been discussed with the patient and family. After consideration of risks, benefits and other options for treatment, the patient has consented to  Procedure(s): PERCUTANEOUS CORONARY STENT INTERVENTION (PCI-S) (N/A) as a surgical intervention .  The patient's history has been reviewed, patient examined, no change in status, stable for surgery.  I have reviewed the patient's chart and labs.  Questions were answered to the patient's satisfaction.     Clent Demark

## 2014-12-14 NOTE — CV Procedure (Signed)
PTCA stenting to LAD report dictated on 12/14/2014 dictation number is [id number]

## 2014-12-14 NOTE — Progress Notes (Signed)
Inpatient Diabetes Program Recommendations  AACE/ADA: New Consensus Statement on Inpatient Glycemic Control (2013)  Target Ranges:  Prepandial:   less than 140 mg/dL      Peak postprandial:   less than 180 mg/dL (1-2 hours)      Critically ill patients:  140 - 180 mg/dL   Results for LARRI, HAMEL (MRN [id number]) as of 12/14/2014 10:07  Ref. Range 12/13/2014 11:56 12/13/2014 16:45 12/13/2014 20:11 12/14/2014 08:04  Glucose-Capillary Latest Ref Range: 70-99 mg/dL 158 (H) 177 (H) 207 (H) 123 (H)    Diabetes history: DM 2 Outpatient Diabetes medications: Novolog 70/30 35 units BID Current orders for Inpatient glycemic control: Lantus 20 units BID, Novolog 0-9 units TID  Inpatient Diabetes Program Recommendations Correction (SSI): Patient's glucose was 207 mg/dl last pm. Please consider ordering Novolog 0-5 units QHS for bedtime coverage.   Thanks,  Tama Headings RN, MSN, [facility] Inpatient Diabetes Coordinator Team Pager [phone]

## 2014-12-14 NOTE — Progress Notes (Signed)
Unable to collect wound culture on pt right foot.  Pt does not have any open sores for me to get a culture.  Will continue to closely monitor.

## 2014-12-15 LAB — BASIC METABOLIC PANEL
Anion gap: 10 (ref 5–15)
BUN: 16 mg/dL (ref 6–23)
CALCIUM: 8.1 mg/dL — AB (ref 8.4–10.5)
CO2: 24 mmol/L (ref 19–32)
Chloride: 105 mmol/L (ref 96–112)
Creatinine, Ser: 1.73 mg/dL — ABNORMAL HIGH (ref 0.50–1.35)
GFR calc non Af Amer: 42 mL/min — ABNORMAL LOW (ref >90)
GFR, EST AFRICAN AMERICAN: 49 mL/min — AB (ref >90)
Glucose, Bld: 175 mg/dL — ABNORMAL HIGH (ref 70–99)
Potassium: 3.9 mmol/L (ref 3.5–5.1)
SODIUM: 139 mmol/L (ref 135–145)

## 2014-12-15 LAB — GLUCOSE, CAPILLARY
GLUCOSE-CAPILLARY: 164 mg/dL — AB (ref 70–99)
GLUCOSE-CAPILLARY: 172 mg/dL — AB (ref 70–99)
GLUCOSE-CAPILLARY: 152 mg/dL — AB (ref 70–99)
Glucose-Capillary: 145 mg/dL — ABNORMAL HIGH (ref 70–99)

## 2014-12-15 LAB — CBC
HCT: 27.6 % — ABNORMAL LOW (ref 39.0–52.0)
Hemoglobin: 8.9 g/dL — ABNORMAL LOW (ref 13.0–17.0)
MCH: 28.5 pg (ref 26.0–34.0)
MCHC: 32.2 g/dL (ref 30.0–36.0)
MCV: 88.5 fL (ref 78.0–100.0)
Platelets: 478 10*3/uL — ABNORMAL HIGH (ref 150–400)
RBC: 3.12 MIL/uL — ABNORMAL LOW (ref 4.22–5.81)
RDW: 14.7 % (ref 11.5–15.5)
WBC: 9.6 10*3/uL (ref 4.0–10.5)

## 2014-12-15 LAB — MRSA PCR SCREENING: MRSA by PCR: NEGATIVE

## 2014-12-15 MED ORDER — FERROUS SULFATE 325 (65 FE) MG PO TABS
325.0000 mg | ORAL_TABLET | Freq: Three times a day (TID) | ORAL | Status: DC
  Administered 2014-12-15 – 2014-12-19 (×13): 325 mg via ORAL
  Filled 2014-12-15 (×17): qty 1

## 2014-12-15 MED ORDER — ASPIRIN EC 81 MG PO TBEC
81.0000 mg | DELAYED_RELEASE_TABLET | Freq: Every day | ORAL | Status: DC
  Administered 2014-12-15 – 2014-12-19 (×5): 81 mg via ORAL
  Filled 2014-12-15 (×6): qty 1

## 2014-12-15 MED ORDER — ISOSORB DINITRATE-HYDRALAZINE 20-37.5 MG PO TABS
1.0000 | ORAL_TABLET | Freq: Three times a day (TID) | ORAL | Status: DC
  Administered 2014-12-15 – 2014-12-16 (×6): 1 via ORAL
  Filled 2014-12-15 (×10): qty 1

## 2014-12-15 MED ORDER — ONDANSETRON HCL 4 MG/2ML IJ SOLN
4.0000 mg | Freq: Four times a day (QID) | INTRAMUSCULAR | Status: DC | PRN
  Administered 2014-12-15: 10:00:00 4 mg via INTRAVENOUS
  Filled 2014-12-15: qty 2

## 2014-12-15 MED ORDER — SENNOSIDES-DOCUSATE SODIUM 8.6-50 MG PO TABS: 1.0000 | ORAL_TABLET | Freq: Two times a day (BID) | ORAL | Status: DC | PRN

## 2014-12-15 NOTE — Progress Notes (Signed)
CARDIAC REHAB PHASE I   PRE:  Rate/Rhythm: 89 SR  BP:  Supine:   Sitting: 146/82  Standing:    SaO2: 94% RA  MODE:  Ambulation: 600 ft   POST:  Rate/Rhythm: 90  BP:  Supine:   Sitting: 121/86  Standing:    SaO2: 93% inc to 96% RA  [id number] Pt tolerated ambulation well with assist x1, one standing rest break taken, mild SOB after walk, denies any CP, VSS. PCI education given including CP, NTG use and calling 911, Effient use, restrictions, risk factor modification, reinforced tobacco cessation, heart healthy diet and diabetes diet sheet given. Discussed activity progression, but hold strenuous activity until after staged PCI. MI book given since pt had recent MI. Pt is interested in Phase 2 cardiac rehab at [facility] after staged PCI, will pursue at that time. Pt verbalizes understanding of instructions given.    Sol Passer, MS, ACSM CCEP

## 2014-12-15 NOTE — Progress Notes (Signed)
Subjective:  Denies any chest pain but complains of shortness of breath with minimal exertion. States overall feels better after the PCI to LAD. Leg swelling and redness improved  Objective:  Vital Signs in the last 24 hours: Temp:  [97.5 F (36.4 C)-98.6 F (37 C)] 98.6 F (37 C) (04/16 0828) Pulse Rate:  [68-96] 96 (04/16 0828) Resp:  [13-23] 18 (04/16 0830) BP: (106-204)/(60-110) 161/84 mmHg (04/16 0830) SpO2:  [96 %-100 %] 99 % (04/16 0828) Weight:  [102.3 kg (225 lb 8.5 oz)] 102.3 kg (225 lb 8.5 oz) (04/16 0430)  Intake/Output from previous day: 04/15 0701 - 04/16 0700 In: 2249.6 [P.O.:240; I.V.:2009.6] Out: 4125 [Urine:4125] Intake/Output from this shift: Total I/O In: 480 [P.O.:480] Out: 225 [Urine:225]  Physical Exam: Neck: no adenopathy, no carotid bruit, no JVD and supple, symmetrical, trachea midline Lungs: Decreased breath sound at bases with faint rales Heart: regular rate and rhythm, S1, S2 normal and Soft systolic murmur and S3 gallop noted Abdomen: soft, non-tender; bowel sounds normal; no masses,  no organomegaly Extremities: No clubbing cyanosis or edema left leg right leg 2+ swelling noted with mild erythema right groin stable  Lab Results:  Recent Labs  12/14/14 0444 12/15/14 0415  WBC 9.8 9.6  HGB 8.7* 8.9*  PLT 455* 478*    Recent Labs  12/14/14 0444 12/15/14 0415  NA 141 139  K 3.8 3.9  CL 109 105  CO2 22 24  GLUCOSE 147* 175*  BUN 23 16  CREATININE 1.97* 1.73*    Recent Labs  12/13/14 1803 12/14/14 0124  TROPONINI 0.10*  0.11* 0.08*   Hepatic Function Panel  Recent Labs  12/13/14 1803  PROT 5.8*  ALBUMIN 2.3*  AST 12  ALT 10  ALKPHOS 107  BILITOT 0.4    Recent Labs  12/14/14 0444  CHOL 100   No results for input(s): PROTIME in the last 72 hours.  Imaging: Imaging results have been reviewed and Dg Foot 2 Views Right  12/13/2014   CLINICAL DATA:  Cellulitis. Pain and swelling right foot. Previous surgeries.  Diabetes.  EXAM: RIGHT FOOT - 2 VIEW  COMPARISON:  None.  FINDINGS: There has been a dictation distal to the distal aspect fourth metatarsal. There is osteolysis involving the head of the third metatarsal with minimal adjacent periosteal reaction. Subtle ill definition of the cortex of the base of the fourth proximal phalanx. There is increased lucency irregularity at the base of the second and third metatarsals suggesting subacute to chronic fractures. Mild degenerate change of the midfoot. Moderate inferior calcaneal spur. Small vessel atherosclerotic disease is present.  IMPRESSION: Osteolysis of the head of the third metatarsal and possibly base of the third proximal phalanx likely osteomyelitis.  Lucency and irregularity involving the base of the second and third metatarsals suggesting a subacute to chronic fracture.  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   Electronically Signed   By: Marin Olp M.D.   On: 12/13/2014 13:29    Cardiac Studies:  Assessment/Plan:  Resolving decompensated systolic congestive heart failure Recent non-Q-wave myocardial infarction Multivessel coronary artery disease status post staged PTCA stenting 2 to proximal and distal LAD with excellent results Hypertension Insulin requiring diabetes mellitus Chronic kidney disease stage IV Anemia of chronic disease Peripheral vascular disease Resolving Cellulitis right foot with possible osteomyelitis Tobacco abuse Hypercholesteremia Positive family history of coronary artery disease Plan As per orders Transfer to telemetry Check labs in a.m.   LOS: 2 days  Scott [name] 12/15/2014, 10:12 AM

## 2014-12-15 NOTE — Progress Notes (Signed)
Transferred -in from [facility] by wheelchair awake and alert.

## 2014-12-16 LAB — CBC
HCT: 26.9 % — ABNORMAL LOW (ref 39.0–52.0)
Hemoglobin: 8.8 g/dL — ABNORMAL LOW (ref 13.0–17.0)
MCH: 28.9 pg (ref 26.0–34.0)
MCHC: 32.7 g/dL (ref 30.0–36.0)
MCV: 88.2 fL (ref 78.0–100.0)
PLATELETS: 478 10*3/uL — AB (ref 150–400)
RBC: 3.05 MIL/uL — AB (ref 4.22–5.81)
RDW: 14.8 % (ref 11.5–15.5)
WBC: 9.4 10*3/uL (ref 4.0–10.5)

## 2014-12-16 LAB — GLUCOSE, CAPILLARY
GLUCOSE-CAPILLARY: 153 mg/dL — AB (ref 70–99)
Glucose-Capillary: 111 mg/dL — ABNORMAL HIGH (ref 70–99)
Glucose-Capillary: 165 mg/dL — ABNORMAL HIGH (ref 70–99)
Glucose-Capillary: 178 mg/dL — ABNORMAL HIGH (ref 70–99)

## 2014-12-16 LAB — BASIC METABOLIC PANEL
Anion gap: 8 (ref 5–15)
BUN: 14 mg/dL (ref 6–23)
CO2: 26 mmol/L (ref 19–32)
Calcium: 8 mg/dL — ABNORMAL LOW (ref 8.4–10.5)
Chloride: 105 mmol/L (ref 96–112)
Creatinine, Ser: 2 mg/dL — ABNORMAL HIGH (ref 0.50–1.35)
GFR calc non Af Amer: 36 mL/min — ABNORMAL LOW (ref >90)
GFR, EST AFRICAN AMERICAN: 41 mL/min — AB (ref >90)
Glucose, Bld: 99 mg/dL (ref 70–99)
Potassium: 3.6 mmol/L (ref 3.5–5.1)
Sodium: 139 mmol/L (ref 135–145)

## 2014-12-16 LAB — BRAIN NATRIURETIC PEPTIDE: B Natriuretic Peptide: 136.3 pg/mL — ABNORMAL HIGH (ref 0.0–100.0)

## 2014-12-16 NOTE — Progress Notes (Signed)
Subjective:  Denies any chest pain states breathing has improved but continues to have right leg swelling and redness.  Objective:  Vital Signs in the last 24 hours: Temp:  [98 F (36.7 C)-99 F (37.2 C)] 98.1 F (36.7 C) (04/17 1151) Pulse Rate:  [49-81] 49 (04/17 0800) BP: (139-198)/(57-102) 165/87 mmHg (04/17 1151) SpO2:  [93 %-97 %] 96 % (04/17 0800) Weight:  [98.2 kg (216 lb 7.9 oz)] 98.2 kg (216 lb 7.9 oz) (04/17 0349)  Intake/Output from previous day: 04/16 0701 - 04/17 0700 In: 1900 [P.O.:1520; I.V.:30; IV Piggyback:350] Out: 2125 [Urine:2125] Intake/Output from this shift: Total I/O In: 290 [P.O.:240; IV Piggyback:50] Out: 800 [Urine:800]  Physical Exam: Neck: no adenopathy, no carotid bruit, no JVD and supple, symmetrical, trachea midline Lungs: Decreased breath sound at bases Heart: regular rate and rhythm, S1, S2 normal and Soft systolic murmur and S3 gallop noted Abdomen: soft, non-tender; bowel sounds normal; no masses,  no organomegaly Extremities: No clubbing cyanosis edema left leg right leg and foot swelling with erythema noted  Lab Results:  Recent Labs  12/15/14 0415 12/16/14 0248  WBC 9.6 9.4  HGB 8.9* 8.8*  PLT 478* 478*    Recent Labs  12/15/14 0415 12/16/14 0248  NA 139 139  K 3.9 3.6  CL 105 105  CO2 24 26  GLUCOSE 175* 99  BUN 16 14  CREATININE 1.73* 2.00*    Recent Labs  12/13/14 1803 12/14/14 0124  TROPONINI 0.10*  0.11* 0.08*   Hepatic Function Panel  Recent Labs  12/13/14 1803  PROT 5.8*  ALBUMIN 2.3*  AST 12  ALT 10  ALKPHOS 107  BILITOT 0.4    Recent Labs  12/14/14 0444  CHOL 100   No results for input(s): PROTIME in the last 72 hours.  Imaging: Imaging results have been reviewed and No results found.  Cardiac Studies:  Assessment/Plan:  Resolving decompensated systolic congestive heart failure Recent non-Q-wave myocardial infarction Multivessel coronary artery disease status post staged PTCA  stenting 2 to proximal and distal LAD with excellent results Hypertension Insulin requiring diabetes mellitus Acute on Chronic kidney disease stage IV secondary to contrast/ACE inhibitor Anemia of chronic disease Peripheral vascular disease Resolving Cellulitis right foot with possible osteomyelitis Tobacco abuse Hypercholesteremia Positive family history of coronary artery disease  Plan Hold ACE inhibitor for now Continue present antibiotics as per orthopedics Check labs in a.m. Patient advised to keep the leg elevated Will discuss with patient further regarding PCI to left circumflex/obtuse marginal and RCA.  LOS: 3 days    Scott [name] N 12/16/2014, 12:33 PM

## 2014-12-17 LAB — BASIC METABOLIC PANEL
Anion gap: 12 (ref 5–15)
BUN: 16 mg/dL (ref 6–23)
CALCIUM: 8.1 mg/dL — AB (ref 8.4–10.5)
CO2: 23 mmol/L (ref 19–32)
CREATININE: 2.08 mg/dL — AB (ref 0.50–1.35)
Chloride: 107 mmol/L (ref 96–112)
GFR calc Af Amer: 39 mL/min — ABNORMAL LOW (ref >90)
GFR, EST NON AFRICAN AMERICAN: 34 mL/min — AB (ref >90)
GLUCOSE: 134 mg/dL — AB (ref 70–99)
Potassium: 3.4 mmol/L — ABNORMAL LOW (ref 3.5–5.1)
SODIUM: 142 mmol/L (ref 135–145)

## 2014-12-17 LAB — CBC
HEMATOCRIT: 28.5 % — AB (ref 39.0–52.0)
Hemoglobin: 9.1 g/dL — ABNORMAL LOW (ref 13.0–17.0)
MCH: 28.3 pg (ref 26.0–34.0)
MCHC: 31.9 g/dL (ref 30.0–36.0)
MCV: 88.8 fL (ref 78.0–100.0)
Platelets: 475 10*3/uL — ABNORMAL HIGH (ref 150–400)
RBC: 3.21 MIL/uL — ABNORMAL LOW (ref 4.22–5.81)
RDW: 14.5 % (ref 11.5–15.5)
WBC: 8.8 10*3/uL (ref 4.0–10.5)

## 2014-12-17 LAB — GLUCOSE, CAPILLARY
GLUCOSE-CAPILLARY: 159 mg/dL — AB (ref 70–99)
GLUCOSE-CAPILLARY: 135 mg/dL — AB (ref 70–99)
Glucose-Capillary: 88 mg/dL (ref 70–99)
Glucose-Capillary: 178 mg/dL — ABNORMAL HIGH (ref 70–99)

## 2014-12-17 LAB — VANCOMYCIN, TROUGH: Vancomycin Tr: 22.6 ug/mL — ABNORMAL HIGH (ref 10.0–20.0)

## 2014-12-17 MED ORDER — SODIUM CHLORIDE 0.9 % IJ SOLN
3.0000 mL | Freq: Two times a day (BID) | INTRAMUSCULAR | Status: DC
  Administered 2014-12-17 – 2014-12-18 (×2): 3 mL via INTRAVENOUS

## 2014-12-17 MED ORDER — SODIUM CHLORIDE 0.9 % IV SOLN
1.0000 mL/kg/h | INTRAVENOUS | Status: DC
  Administered 2014-12-18: 1 mL/kg/h via INTRAVENOUS

## 2014-12-17 MED ORDER — SODIUM CHLORIDE 0.9 % IV SOLN: 250.0000 mL | INTRAVENOUS | Status: DC | PRN

## 2014-12-17 MED ORDER — ASPIRIN 81 MG PO CHEW
81.0000 mg | CHEWABLE_TABLET | ORAL | Status: AC
  Administered 2014-12-18: 81 mg via ORAL
  Filled 2014-12-17: qty 1

## 2014-12-17 MED ORDER — SODIUM CHLORIDE 0.9 % IJ SOLN: 3.0000 mL | INTRAMUSCULAR | Status: DC | PRN

## 2014-12-17 MED ORDER — VANCOMYCIN HCL IN DEXTROSE 750-5 MG/150ML-% IV SOLN
750.0000 mg | INTRAVENOUS | Status: DC
  Administered 2014-12-18: 750 mg via INTRAVENOUS
  Filled 2014-12-17 (×2): qty 150

## 2014-12-17 MED ORDER — ISOSORB DINITRATE-HYDRALAZINE 20-37.5 MG PO TABS
2.0000 | ORAL_TABLET | Freq: Three times a day (TID) | ORAL | Status: DC
  Administered 2014-12-17 – 2014-12-19 (×6): 2 via ORAL
  Filled 2014-12-17 (×9): qty 2

## 2014-12-17 MED ORDER — POTASSIUM CHLORIDE CRYS ER 20 MEQ PO TBCR
40.0000 meq | EXTENDED_RELEASE_TABLET | Freq: Once | ORAL | Status: AC
  Administered 2014-12-17: 40 meq via ORAL
  Filled 2014-12-17: qty 2

## 2014-12-17 MED FILL — Sodium Chloride IV Soln 0.9%: INTRAVENOUS | Qty: 50 | Status: AC

## 2014-12-17 NOTE — Progress Notes (Signed)
Ambulated 1050 feet with pt around unit. Denied any SOB and was able to hold a conversation. Heart rate remained stable (in the 70's) throughout the walk. Pt stated he felt a little tired towards the end, but insisted on finishing the lap without sitting down. Returned pt to room and to bed with call light within reach.

## 2014-12-17 NOTE — Progress Notes (Signed)
ANTIBIOTIC CONSULT NOTE - FOLLOW UP  Pharmacy Consult for Vancomycin/Zosyn Indication: Osteomyelitis/Diabetic Foot infection  No Known Allergies  Patient Measurements: Height: 5\' 9"  (175.3 cm) Weight: 216 lb 0.8 oz (98 kg) IBW/kg (Calculated) : 70.7  Vital Signs: Temp: 97.7 F (36.5 C) (04/18 0812) Temp Source: Oral (04/18 0812) BP: 195/106 mmHg (04/18 0812) Pulse Rate: 71 (04/17 2356) Intake/Output from previous day: 04/17 0701 - 04/18 0700 In: 790 [P.O.:440; IV Piggyback:350] Out: 3100 [Urine:3100] Intake/Output from this shift:    Labs:  Recent Labs  12/15/14 0415 12/16/14 0248 12/17/14 0227  WBC 9.6 9.4 8.8  HGB 8.9* 8.8* 9.1*  PLT 478* 478* 475*  CREATININE 1.73* 2.00* 2.08*   Estimated Creatinine Clearance: 45.8 mL/min (by C-G formula based on Cr of 2.08). No results for input(s): VANCOTROUGH, VANCOPEAK, VANCORANDOM, GENTTROUGH, GENTPEAK, GENTRANDOM, TOBRATROUGH, TOBRAPEAK, TOBRARND, AMIKACINPEAK, AMIKACINTROU, AMIKACIN in the last 72 hours.   Microbiology: Recent Results (from the past 720 hour(s))  Culture, blood (routine x 2)     Status: None (Preliminary result)   Collection Time: 12/13/14  1:00 PM  Result Value Ref Range Status   Specimen Description BLOOD RIGHT HAND  Final   Special Requests BOTTLES DRAWN AEROBIC ONLY 6CC  Final   Culture   Final           BLOOD CULTURE RECEIVED NO GROWTH TO DATE CULTURE WILL BE HELD FOR 5 DAYS BEFORE ISSUING A FINAL NEGATIVE REPORT Performed at [facility]    Report Status PENDING  Incomplete  Culture, blood (routine x 2)     Status: None (Preliminary result)   Collection Time: 12/13/14  1:05 PM  Result Value Ref Range Status   Specimen Description BLOOD LEFT HAND  Final   Special Requests BOTTLES DRAWN AEROBIC ONLY 2.5CC  Final   Culture   Final           BLOOD CULTURE RECEIVED NO GROWTH TO DATE CULTURE WILL BE HELD FOR 5 DAYS BEFORE ISSUING A FINAL NEGATIVE REPORT Performed at [facility]     Report Status PENDING  Incomplete  Wound culture     Status: None (Preliminary result)   Collection Time: 12/15/14  9:02 AM  Result Value Ref Range Status   Specimen Description WOUND RIGHT FOOT  Final   Special Requests NONE  Final   Gram Stain PENDING  Incomplete   Culture   Final    NO GROWTH 2 DAYS Performed at [facility]    Report Status PENDING  Incomplete  MRSA PCR Screening     Status: None   Collection Time: 12/15/14 11:33 AM  Result Value Ref Range Status   MRSA by PCR NEGATIVE NEGATIVE Final    Comment:        The GeneXpert MRSA Assay (FDA approved for NASAL specimens only), is one component of a comprehensive MRSA colonization surveillance program. It is not intended to diagnose MRSA infection nor to guide or monitor treatment for MRSA infections.     Anti-infectives    Start     Dose/Rate Route Frequency Ordered Stop   12/14/14 2000  vancomycin (VANCOCIN) 1,250 mg in sodium chloride 0.9 % 250 mL IVPB     1,250 mg 166.7 mL/hr over 90 Minutes Intravenous Every 24 hours 12/13/14 2145     12/14/14 0200  piperacillin-tazobactam (ZOSYN) IVPB 3.375 g     3.375 g 12.5 mL/hr over 240 Minutes Intravenous Every 8 hours 12/13/14 2145     12/13/14 1900  vancomycin (VANCOCIN) 1,750 mg in sodium chloride 0.9 % 500 mL IVPB     1,750 mg 250 mL/hr over 120 Minutes Intravenous  Once 12/13/14 1829 12/13/14 2208   12/13/14 1845  piperacillin-tazobactam (ZOSYN) IVPB 3.375 g     3.375 g 100 mL/hr over 30 Minutes Intravenous  Once 12/13/14 1829 12/13/14 2039   12/13/14 1300  ceFAZolin (ANCEF) IVPB 1 g/50 mL premix  Status:  Discontinued     1 g 100 mL/hr over 30 Minutes Intravenous 3 times per day 12/13/14 1210 12/13/14 1828      Assessment: 54 yo male admitted 12/13/2014  with SOB and history of recent MI, DM2 and CAD. Pharmacy consulted to start heparin for r/o ACS. Pharmacy is consulted to dose zosyn and vancomycin for diabetic foot infection/osteomyelitis  PMH  recent MI, 3V CAD, HTN, CKD, DM, PVD   Infectious Disease: zosyn and vancomycin for diabetic foot infection/osteomyelitis. WBC= 8.8, afeb. Ortho consulted for foot ulceration. May or may not need surgery  4/15 vanc > 4/15 zosyn > 4/15 ancef x1  4/14 blood x2 > ntgd 4/16 wound > ngtd  Goal of Therapy:  Vancomycin trough level 15-20 mcg/ml  Plan:  -Zosyn 3.375 g IV q8h -Vancomycin 1750mg  IV followed by 1250mg  q24h  -Measure antibiotic drug levels at steady state, prior to 4th dose pm 4/18 -Follow up culture results, renal function, and clinical course  Thank you for allowing pharmacy to be a part of this patients care team.  Rowe Robert Pharm.D., BCPS, AQ-Cardiology Clinical Pharmacist 12/17/2014 8:33 AM Pager: [phone] Phone: [phone]

## 2014-12-17 NOTE — Progress Notes (Signed)
CARDIAC REHAB PHASE I   PRE:  Rate/Rhythm: 26 SR  BP:  Sitting: 156/83        SaO2: 96 RA  MODE:  Ambulation: 700 ft   POST:  Rate/Rhythm: 76 SR  BP:  Sitting: 161/91         SaO2: 95 RA  Pt ambulated 700 ft on RA, hand held assist, steady gait, tolerated well. Pt did c/o some mild SOB after 600 ft, declined rest period. Pt states "it feels good to walk."  VSS. Reviewed exercise guidelines, pt verbalized understanding. Pt returned to chair with call light within reach.   [id number]   Lenna Sciara, RN, BSN 12/17/2014 2:36 PM

## 2014-12-17 NOTE — Progress Notes (Signed)
Subjective:  Complaints of vague right-sided chest pain without associated symptoms.  States breathing is improved.  Objective:  Vital Signs in the last 24 hours: Temp:  [97.7 F (36.5 C)-98.4 F (36.9 C)] 97.7 F (36.5 C) (04/18 0812) Pulse Rate:  [71-78] 78 (04/18 0800) Resp:  [16-18] 16 (04/18 0812) BP: (165-195)/(81-106) 195/106 mmHg (04/18 0812) SpO2:  [94 %-98 %] 95 % (04/18 0812) Weight:  [98 kg (216 lb 0.8 oz)] 98 kg (216 lb 0.8 oz) (04/18 0500)  Intake/Output from previous day: 04/17 0701 - 04/18 0700 In: 790 [P.O.:440; IV Piggyback:350] Out: 3100 [Urine:3100] Intake/Output from this shift:    Physical Exam: Neck: no adenopathy, no carotid bruit, no JVD and supple, symmetrical, trachea midline Lungs: decreased breath sounds at bases Heart: regular rate and rhythm, S1, S2 normal and soft systolic murmur noted Abdomen: soft, non-tender; bowel sounds normal; no masses,  no organomegaly Extremities: extremities normal, atraumatic, no cyanosis or edema  Lab Results:  Recent Labs  12/16/14 0248 12/17/14 0227  WBC 9.4 8.8  HGB 8.8* 9.1*  PLT 478* 475*    Recent Labs  12/16/14 0248 12/17/14 0227  NA 139 142  K 3.6 3.4*  CL 105 107  CO2 26 23  GLUCOSE 99 134*  BUN 14 16  CREATININE 2.00* 2.08*   No results for input(s): TROPONINI in the last 72 hours.  Invalid input(s): CK, MB Hepatic Function Panel No results for input(s): PROT, ALBUMIN, AST, ALT, ALKPHOS, BILITOT, BILIDIR, IBILI in the last 72 hours. No results for input(s): CHOL in the last 72 hours. No results for input(s): PROTIME in the last 72 hours.  Imaging: Imaging results have been reviewed and No results found.  Cardiac Studies:  Assessment/Plan:  Resolving decompensated systolic congestive heart failure Recent non-Q-wave myocardial infarction Multivessel coronary artery disease status post staged PTCA stenting 2 to proximal and distal LAD with excellent  results uncontrolledHypertension Insulin requiring diabetes mellitus Acute on Chronic kidney disease stage IV secondary to contrast/ACE inhibitor Anemia of chronic disease Peripheral vascular disease Resolving Cellulitis right foot with possible osteomyelitis Tobacco abuse Hypercholesteremia Positive family history of coronary artery disease Plan Increase BiDil as per orders Hold Lasix for today Discussed with patient regarding PCI to left circumflex/obtuse marginal and RCA its risks and benefits E death, stroke need for emergency CABG worsening renal function requiring hemodialysis local vascular complications etc. And concents for PCI   LOS: 4 days    Scott [name] 12/17/2014, 9:03 AM

## 2014-12-17 NOTE — Progress Notes (Signed)
ANTIBIOTIC CONSULT NOTE - FOLLOW UP  Pharmacy Consult for Vancomycin Indication: Diabetic foot infection  No Known Allergies  Patient Measurements: Height: 5\' 9"  (175.3 cm) Weight: 216 lb 0.8 oz (98 kg) IBW/kg (Calculated) : 70.7 Adjusted Body Weight:    Vital Signs: Temp: 97.6 F (36.4 C) (04/18 1900) Temp Source: Oral (04/18 1900) BP: 129/63 mmHg (04/18 1900) Pulse Rate: 67 (04/18 1900) Intake/Output from previous day: 04/17 0701 - 04/18 0700 In: 790 [P.O.:440; IV Piggyback:350] Out: 3100 [Urine:3100] Intake/Output from this shift: Total I/O In: 262.5 [IV Piggyback:262.5] Out: -   Labs:  Recent Labs  12/15/14 0415 12/16/14 0248 12/17/14 0227  WBC 9.6 9.4 8.8  HGB 8.9* 8.8* 9.1*  PLT 478* 478* 475*  CREATININE 1.73* 2.00* 2.08*   Estimated Creatinine Clearance: 45.8 mL/min (by C-G formula based on Cr of 2.08).  Recent Labs  12/17/14 1930  VANCOTROUGH 22.6*     Microbiology:   Anti-infectives    Start     Dose/Rate Route Frequency Ordered Stop   12/14/14 2000  vancomycin (VANCOCIN) 1,250 mg in sodium chloride 0.9 % 250 mL IVPB     1,250 mg 166.7 mL/hr over 90 Minutes Intravenous Every 24 hours 12/13/14 2145     12/14/14 0200  piperacillin-tazobactam (ZOSYN) IVPB 3.375 g     3.375 g 12.5 mL/hr over 240 Minutes Intravenous Every 8 hours 12/13/14 2145     12/13/14 1900  vancomycin (VANCOCIN) 1,750 mg in sodium chloride 0.9 % 500 mL IVPB     1,750 mg 250 mL/hr over 120 Minutes Intravenous  Once 12/13/14 1829 12/13/14 2208   12/13/14 1845  piperacillin-tazobactam (ZOSYN) IVPB 3.375 g     3.375 g 100 mL/hr over 30 Minutes Intravenous  Once 12/13/14 1829 12/13/14 2039   12/13/14 1300  ceFAZolin (ANCEF) IVPB 1 g/50 mL premix  Status:  Discontinued     1 g 100 mL/hr over 30 Minutes Intravenous 3 times per day 12/13/14 1210 12/13/14 1828      Assessment: 54 yo male admitted 12/13/2014  with SOB and history of recent MI, DM2 and CAD.Pharmacy is consulted to  dose zosyn and vancomycin for diabetic foot infection/osteomyelitis.  Infectious Disease: zosyn and vancomycin for diabetic foot infection/osteomyelitis. WBC= 8.8, afeb. Ortho consulted for foot ulceration. May or may not need surgery. Scr 1.73>2>2.08 trending up.  4/15 vanc >   - 4/18 VT: 22.6 4/15 zosyn >  4/15 ancef x1  4/14 blood x2 > ntgd  4/16 wound > ngtd   Goal of Therapy:  Vancomycin trough level 15-20 mcg/ml  Plan:  Decrease Vancomycin to 750mg  IV q24h for now as Scr continues to trend upward and drug likely to continue to accumulate.   Scott [name], PharmD, BCPS Clinical Staff Pharmacist Pager [phone]  Scott [name] 12/17/2014,8:48 PM

## 2014-12-18 ENCOUNTER — Encounter (HOSPITAL_COMMUNITY)
Admission: AD | Disposition: A | Discharge status: Home / Self Care | Payer: Self-pay | Source: Other Acute Inpatient Hospital | Attending: Cardiology

## 2014-12-18 ENCOUNTER — Encounter (HOSPITAL_COMMUNITY): Payer: Self-pay | Admitting: Cardiology

## 2014-12-18 ENCOUNTER — Other Ambulatory Visit: Payer: Self-pay

## 2014-12-18 HISTORY — PX: PERCUTANEOUS CORONARY STENT INTERVENTION (PCI-S): SHX5485

## 2014-12-18 LAB — BASIC METABOLIC PANEL
Anion gap: 10 (ref 5–15)
BUN: 17 mg/dL (ref 6–23)
CALCIUM: 8.1 mg/dL — AB (ref 8.4–10.5)
CO2: 24 mmol/L (ref 19–32)
CREATININE: 2.11 mg/dL — AB (ref 0.50–1.35)
Chloride: 107 mmol/L (ref 96–112)
GFR calc Af Amer: 39 mL/min — ABNORMAL LOW (ref >90)
GFR, EST NON AFRICAN AMERICAN: 33 mL/min — AB (ref >90)
GLUCOSE: 98 mg/dL (ref 70–99)
Potassium: 3.7 mmol/L (ref 3.5–5.1)
Sodium: 141 mmol/L (ref 135–145)

## 2014-12-18 LAB — POCT ACTIVATED CLOTTING TIME: Activated Clotting Time: 380 seconds

## 2014-12-18 LAB — WOUND CULTURE
Culture: NO GROWTH
GRAM STAIN: NONE SEEN

## 2014-12-18 LAB — CBC
HCT: 28.8 % — ABNORMAL LOW (ref 39.0–52.0)
Hemoglobin: 9.2 g/dL — ABNORMAL LOW (ref 13.0–17.0)
MCH: 28.5 pg (ref 26.0–34.0)
MCHC: 31.9 g/dL (ref 30.0–36.0)
MCV: 89.2 fL (ref 78.0–100.0)
PLATELETS: 489 10*3/uL — AB (ref 150–400)
RBC: 3.23 MIL/uL — ABNORMAL LOW (ref 4.22–5.81)
RDW: 14.7 % (ref 11.5–15.5)
WBC: 11.6 10*3/uL — ABNORMAL HIGH (ref 4.0–10.5)

## 2014-12-18 LAB — GLUCOSE, CAPILLARY
GLUCOSE-CAPILLARY: 90 mg/dL (ref 70–99)
Glucose-Capillary: 93 mg/dL (ref 70–99)
Glucose-Capillary: 115 mg/dL — ABNORMAL HIGH (ref 70–99)
Glucose-Capillary: 123 mg/dL — ABNORMAL HIGH (ref 70–99)

## 2014-12-18 SURGERY — PERCUTANEOUS CORONARY STENT INTERVENTION (PCI-S)
Anesthesia: LOCAL

## 2014-12-18 MED ORDER — LABETALOL HCL 5 MG/ML IV SOLN
10.0000 mg | Freq: Once | INTRAVENOUS | Status: AC
  Administered 2014-12-18: 10 mg via INTRAVENOUS

## 2014-12-18 MED ORDER — MIDAZOLAM HCL 2 MG/2ML IJ SOLN
INTRAMUSCULAR | Status: AC
Start: 2014-12-18 — End: 2014-12-18
  Filled 2014-12-18: qty 2

## 2014-12-18 MED ORDER — FENTANYL CITRATE (PF) 100 MCG/2ML IJ SOLN
INTRAMUSCULAR | Status: AC
  Filled 2014-12-18: qty 2

## 2014-12-18 MED ORDER — NITROGLYCERIN IN D5W 200-5 MCG/ML-% IV SOLN
10.0000 ug/min | INTRAVENOUS | Status: DC
  Administered 2014-12-18: 10 ug/min via INTRAVENOUS

## 2014-12-18 MED ORDER — HYDRALAZINE HCL 20 MG/ML IJ SOLN
10.0000 mg | Freq: Once | INTRAMUSCULAR | Status: AC
  Administered 2014-12-18: 10 mg via INTRAVENOUS
  Filled 2014-12-18: qty 1

## 2014-12-18 MED ORDER — CARVEDILOL 25 MG PO TABS
25.0000 mg | ORAL_TABLET | Freq: Two times a day (BID) | ORAL | Status: DC
  Administered 2014-12-19: 25 mg via ORAL
  Filled 2014-12-18 (×3): qty 1

## 2014-12-18 MED ORDER — SODIUM BICARBONATE BOLUS VIA INFUSION
INTRAVENOUS | Status: DC
Start: 2014-12-19 — End: 2014-12-18
  Filled 2014-12-18: qty 1

## 2014-12-18 MED ORDER — HEPARIN (PORCINE) IN NACL 2-0.9 UNIT/ML-% IJ SOLN
INTRAMUSCULAR | Status: AC
  Filled 2014-12-18: qty 1000

## 2014-12-18 MED ORDER — DEXTROSE 5 % IV SOLN
INTRAVENOUS | Status: AC
  Administered 2014-12-18: 12:00:00 via INTRAVENOUS
  Filled 2014-12-18 (×2): qty 1000

## 2014-12-18 MED ORDER — ASPIRIN 81 MG PO CHEW
81.0000 mg | CHEWABLE_TABLET | Freq: Every day | ORAL | Status: DC
Start: 2014-12-18 — End: 2014-12-18

## 2014-12-18 MED ORDER — ONDANSETRON HCL 4 MG/2ML IJ SOLN: 4.0000 mg | Freq: Four times a day (QID) | INTRAMUSCULAR | Status: DC | PRN

## 2014-12-18 MED ORDER — SODIUM CHLORIDE 0.9 % IV SOLN
INTRAVENOUS | Status: AC
  Administered 2014-12-18: 16:00:00 via INTRAVENOUS

## 2014-12-18 MED ORDER — ATROPINE SULFATE 0.1 MG/ML IJ SOLN
INTRAMUSCULAR | Status: AC
  Filled 2014-12-18: qty 10

## 2014-12-18 MED ORDER — LABETALOL HCL 5 MG/ML IV SOLN
INTRAVENOUS | Status: AC
  Filled 2014-12-18: qty 4

## 2014-12-18 MED ORDER — SODIUM CHLORIDE 0.9 % IV SOLN
0.2500 mg/kg/h | INTRAVENOUS | Status: DC
  Filled 2014-12-18: qty 250

## 2014-12-18 MED ORDER — PRASUGREL HCL 10 MG PO TABS: 10.0000 mg | ORAL_TABLET | Freq: Every day | ORAL | Status: DC

## 2014-12-18 MED ORDER — LIDOCAINE HCL (PF) 1 % IJ SOLN
INTRAMUSCULAR | Status: AC
Start: 2014-12-18 — End: 2014-12-18
  Filled 2014-12-18: qty 30

## 2014-12-18 MED ORDER — ACETAMINOPHEN 325 MG PO TABS: 650.0000 mg | ORAL_TABLET | ORAL | Status: DC | PRN

## 2014-12-18 MED ORDER — CARVEDILOL 12.5 MG PO TABS
12.5000 mg | ORAL_TABLET | Freq: Once | ORAL | Status: AC
  Administered 2014-12-18: 12.5 mg via ORAL
  Filled 2014-12-18: qty 1

## 2014-12-18 MED ORDER — BIVALIRUDIN 250 MG IV SOLR
INTRAVENOUS | Status: AC
  Filled 2014-12-18: qty 250

## 2014-12-18 MED ORDER — NITROGLYCERIN IN D5W 200-5 MCG/ML-% IV SOLN
INTRAVENOUS | Status: AC
  Filled 2014-12-18: qty 250

## 2014-12-18 NOTE — Progress Notes (Signed)
Back from the cath lab awake and alert, right groin sheath in placed instructed not to bend right knee and press groin when coughing, sneezing or when exerting effort.

## 2014-12-18 NOTE — Progress Notes (Signed)
Received a call from the Cath. Lab to start the ivf sodium bicarb., started at 289 ml/hr for the first hour. tol.well.

## 2014-12-18 NOTE — H&P (View-Only) (Signed)
Subjective:  Complaints of vague right-sided chest pain without associated symptoms.  States breathing is improved.  Objective:  Vital Signs in the last 24 hours: Temp:  [97.7 F (36.5 C)-98.4 F (36.9 C)] 97.7 F (36.5 C) (04/18 0812) Pulse Rate:  [71-78] 78 (04/18 0800) Resp:  [16-18] 16 (04/18 0812) BP: (165-195)/(81-106) 195/106 mmHg (04/18 0812) SpO2:  [94 %-98 %] 95 % (04/18 0812) Weight:  [98 kg (216 lb 0.8 oz)] 98 kg (216 lb 0.8 oz) (04/18 0500)  Intake/Output from previous day: 04/17 0701 - 04/18 0700 In: 790 [P.O.:440; IV Piggyback:350] Out: 3100 [Urine:3100] Intake/Output from this shift:    Physical Exam: Neck: no adenopathy, no carotid bruit, no JVD and supple, symmetrical, trachea midline Lungs: decreased breath sounds at bases Heart: regular rate and rhythm, S1, S2 normal and soft systolic murmur noted Abdomen: soft, non-tender; bowel sounds normal; no masses,  no organomegaly Extremities: extremities normal, atraumatic, no cyanosis or edema  Lab Results:  Recent Labs  12/16/14 0248 12/17/14 0227  WBC 9.4 8.8  HGB 8.8* 9.1*  PLT 478* 475*    Recent Labs  12/16/14 0248 12/17/14 0227  NA 139 142  K 3.6 3.4*  CL 105 107  CO2 26 23  GLUCOSE 99 134*  BUN 14 16  CREATININE 2.00* 2.08*   No results for input(s): TROPONINI in the last 72 hours.  Invalid input(s): CK, MB Hepatic Function Panel No results for input(s): PROT, ALBUMIN, AST, ALT, ALKPHOS, BILITOT, BILIDIR, IBILI in the last 72 hours. No results for input(s): CHOL in the last 72 hours. No results for input(s): PROTIME in the last 72 hours.  Imaging: Imaging results have been reviewed and No results found.  Cardiac Studies:  Assessment/Plan:  Resolving decompensated systolic congestive heart failure Recent non-Q-wave myocardial infarction Multivessel coronary artery disease status post staged PTCA stenting 2 to proximal and distal LAD with excellent  results uncontrolledHypertension Insulin requiring diabetes mellitus Acute on Chronic kidney disease stage IV secondary to contrast/ACE inhibitor Anemia of chronic disease Peripheral vascular disease Resolving Cellulitis right foot with possible osteomyelitis Tobacco abuse Hypercholesteremia Positive family history of coronary artery disease Plan Increase BiDil as per orders Hold Lasix for today Discussed with patient regarding PCI to left circumflex/obtuse marginal and RCA its risks and benefits E death, stroke need for emergency CABG worsening renal function requiring hemodialysis local vascular complications etc. And concents for PCI   LOS: 4 days    Therron Sells N 12/17/2014, 9:03 AM

## 2014-12-18 NOTE — CV Procedure (Signed)
PTCA/stenting report to obtuse marginal/left circumflex /PDA branch of RCA dictated on 12/18/2014 dictation number is [id number]

## 2014-12-18 NOTE — Progress Notes (Signed)
BP -188/102 asymptomatic nitro gtt at 70 mcg, DR.Harwani made aware  Of the BP and it is due for sheath pull. Gave order for hydralazine 10 mg iv- given  Endorsed.

## 2014-12-18 NOTE — Interval H&P Note (Signed)
Cath Lab Visit (complete for each Cath Lab visit)  Clinical Evaluation Leading to the Procedure:   ACS: Yes.    Non-ACS:    Anginal Classification: CCS IV  Anti-ischemic medical therapy: Maximal Therapy (2 or more classes of medications)  Non-Invasive Test Results: No non-invasive testing performed  Prior CABG: No previous CABG      History and Physical Interval Note:  12/18/2014 1:10 PM  Scott [name]  has presented today for surgery, with the diagnosis of CP  The various methods of treatment have been discussed with the patient and family. After consideration of risks, benefits and other options for treatment, the patient has consented to  Procedure(s): PERCUTANEOUS CORONARY STENT INTERVENTION (PCI-S) (N/A) as a surgical intervention .  The patient's history has been reviewed, patient examined, no change in status, stable for surgery.  I have reviewed the patient's chart and labs.  Questions were answered to the patient's satisfaction.     Clent Demark

## 2014-12-19 LAB — CBC
HCT: 26.2 % — ABNORMAL LOW (ref 39.0–52.0)
Hemoglobin: 8.5 g/dL — ABNORMAL LOW (ref 13.0–17.0)
MCH: 28.7 pg (ref 26.0–34.0)
MCHC: 32.4 g/dL (ref 30.0–36.0)
MCV: 88.5 fL (ref 78.0–100.0)
PLATELETS: 440 10*3/uL — AB (ref 150–400)
RBC: 2.96 MIL/uL — ABNORMAL LOW (ref 4.22–5.81)
RDW: 14.8 % (ref 11.5–15.5)
WBC: 9 10*3/uL (ref 4.0–10.5)

## 2014-12-19 LAB — CULTURE, BLOOD (ROUTINE X 2)
Culture: NO GROWTH
Culture: NO GROWTH

## 2014-12-19 LAB — BASIC METABOLIC PANEL
ANION GAP: 9 (ref 5–15)
BUN: 13 mg/dL (ref 6–23)
CALCIUM: 8 mg/dL — AB (ref 8.4–10.5)
CO2: 25 mmol/L (ref 19–32)
CREATININE: 1.86 mg/dL — AB (ref 0.50–1.35)
Chloride: 107 mmol/L (ref 96–112)
GFR, EST AFRICAN AMERICAN: 45 mL/min — AB (ref >90)
GFR, EST NON AFRICAN AMERICAN: 39 mL/min — AB (ref >90)
Glucose, Bld: 190 mg/dL — ABNORMAL HIGH (ref 70–99)
Potassium: 3.7 mmol/L (ref 3.5–5.1)
Sodium: 141 mmol/L (ref 135–145)

## 2014-12-19 LAB — GLUCOSE, CAPILLARY
Glucose-Capillary: 157 mg/dL — ABNORMAL HIGH (ref 70–99)
Glucose-Capillary: 111 mg/dL — ABNORMAL HIGH (ref 70–99)

## 2014-12-19 LAB — HEMOGLOBIN A1C
Hgb A1c MFr Bld: 7.7 % — ABNORMAL HIGH (ref 4.8–5.6)
MEAN PLASMA GLUCOSE: 174 mg/dL

## 2014-12-19 MED ORDER — NITROGLYCERIN 0.4 MG SL SUBL: 0.4000 mg | SUBLINGUAL_TABLET | SUBLINGUAL | Status: DC | PRN

## 2014-12-19 MED ORDER — SPIRONOLACTONE 25 MG PO TABS: 25.0000 mg | ORAL_TABLET | Freq: Every day | ORAL | Status: DC

## 2014-12-19 MED ORDER — ISOSORB DINITRATE-HYDRALAZINE 20-37.5 MG PO TABS: 1.0000 | ORAL_TABLET | Freq: Three times a day (TID) | ORAL | Status: DC

## 2014-12-19 MED ORDER — ATORVASTATIN CALCIUM 80 MG PO TABS: 80.0000 mg | ORAL_TABLET | Freq: Every day | ORAL | Status: DC

## 2014-12-19 MED ORDER — PRASUGREL HCL 10 MG PO TABS: 10.0000 mg | ORAL_TABLET | Freq: Every day | ORAL | Status: DC

## 2014-12-19 MED ORDER — FERROUS SULFATE 325 (65 FE) MG PO TABS: 325.0000 mg | ORAL_TABLET | Freq: Three times a day (TID) | ORAL | Status: DC

## 2014-12-19 MED FILL — Sodium Chloride IV Soln 0.9%: INTRAVENOUS | Qty: 50 | Status: AC

## 2014-12-19 NOTE — Discharge Summary (Signed)
Discharge summary dictated on 12/19/2014 dictation number is [id number]

## 2014-12-19 NOTE — Cardiovascular Report (Signed)
Scott [name], Scott [name]              ACCOUNT NO.:  [id number]  MEDICAL RECORD NO.:  [id number]  LOCATION:  2H24C                        FACILITY:  [place]  PHYSICIAN:  Mourad Cwikla N. Terrence Dupont, M.D. DATE OF BIRTH:  [birth year]  DATE OF PROCEDURE:  12/18/2014 DATE OF DISCHARGE:                           CARDIAC CATHETERIZATION   PROCEDURES: 1. Successful percutaneous transluminal coronary angioplasty to mid     and ostial obtuse marginal 2 and proximal left circumflex using 2.5     x 12 mm long balloon. 2. Successful deployment of 2.5 x 15 mm long Xience Alpine drug-     eluting stent in mid obtuse marginal 2. 3. Successful deployment of 2.75 x 28 mm long Xience Alpine drug-     eluting stent in ostial and proximal obtuse marginal and left     circumflex. 4. Successful postdilatation of this stent using 2.75 x 12 mm long [place]     Emerge balloon. 5. Successful percutaneous transluminal coronary angioplasty to     posterior descending coronary artery using 2.0 x 20 mm long Emerge     balloon. 6. Successful deployment of 2.5 x 38 mm long Xience Alpine drug-     eluting stent in posterior descending coronary artery. 7. Successful postdilatation of the stent using 2.5 x 20 mm long      Emerge balloon.  INDICATION FOR THE PROCEDURE:  Mr. 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  He went to [facility] because of sudden onset of shortness of breath associated with diaphoresis.   The patient denies any chest pain, nausea, vomiting.  The patient states had similar presentation 2 weeks ago and was noted to have non-Q-wave myocardial infarction.  The patient does give history of PND, orthopnea, and leg swelling.  Denies palpitation, lightheadedness, or syncope.  The patient subsequently had cardiac catheterization at [facility] which showed severe 3-vessel disease with no good distal targets.  The patient was transferred here for staged PCI. The patient was subsequently underwent PTCA stenting to LAD on December 14, 2014, and was brought back here for PCI to left circumflex/obtuse marginal/PDA.  DESCRIPTION OF PROCEDURE:  After obtaining the informed consent, the patient was placed on fluoroscopy table.  Right groin was prepped and draped in usual fashion.  Xylocaine 1% was used for local anesthesia in the right groin.  With the help of thin wall needle, a 6-French arterial sheath was placed.  The sheath was aspirated and flushed.  Next, 6- French left Judkins catheter was advanced over the wire under fluoroscopic guidance up to the ascending aorta.  Wire was pulled out. The catheter was aspirated and connected to the Manifold.  Catheter was further advanced and engaged into left coronary ostium.  Multiple views of the left system were  obtained.  FINDINGS:  Left main had mild distal disease.  LAD is patent at prior PTCA stented site.  Left circumflex has 75% to 80% proximal stenosis. OM-1 is very, very small.  OM-2 has ostial 75% to 80% and ostial and mid 75% to 80% stenosis.  INTERVENTIONAL PROCEDURE:  Successful PTCA to mid OM-2 and ostial OM-2 and proximal circumflex was done using 2.5 x 12 mm long Emerge balloon for predilatation and then 2.5 x 15 mm long Xience Alpine drug-eluting stent was deployed in mid OM-2 at 11 atmospheric pressure.  This stent was post dilated, going up to 13 atmospheric pressure.  Lesion dilated from 75% to 80% to 0%  residual with excellent TIMI grade 3 distal flow without evidence of dissection or distal embolization.  Next, 2.75 x 28 mm long Xience Alpine drug-eluting stent was deployed in ostial and proximal OM-2, extending into the left circumflex at 11 atmospheric pressure.  The stent was post dilated using 2.75 x 12 mm long [place] Emerge balloon, going up to 18 atmospheric pressure.  Lesions dilated from 75% to 80% to 0% residual with excellent TIMI grade 3 distal flow without evidence of dissection or distal embolization.  The patient received weight-based Angiomax and 30 mg of prasugrel prior to the procedure.  Right JR4 guiding catheter was advanced over the wire under fluoroscopic guidance up to the ascending aorta.  Wire was pulled out.  The catheter was aspirated and connected to the Manifold.  Catheter was further advanced and engaged into right coronary ostium.  A single view of right coronary artery was obtained.  FINDINGS:  RCA has 30% to 40% mid stenosis and 50% to 60% distal sequential stenosis just prior to the bifurcation.  PDA has multiple sequential 80% to 90% stenosis.  PLV branch has 30% to 40% stenosis.  INTERVENTIONAL PROCEDURE:  Successful PTCA to PDA was done using 2.0 x 20 mm long Emerge balloon for predilatation and then 2.5 x 38 mm long Xience Alpine drug-eluting stent was deployed in PDA at 10 atmospheric pressure.  This stent was post dilated using 2.5 x 20 mm long [place] Emerge balloon, going up to 15 atmospheric pressure.  Lesions dilated from 80% to 90% to 0% residual with excellent TIMI grade 3 distal flow without evidence of dissection or distal embolization.  Again, the patient was continued on weight-based Angiomax during the procedure.  The patient tolerated the procedure well.  There were no complications.  The patient was transferred to recovery room in stable condition.     Allegra Lai. Terrence Dupont, M.D.     MNH/MEDQ  D:  12/18/2014  T:  12/19/2014  Job:   [id number]  cc:   Allegra Lai. Terrence Dupont, M.D.

## 2014-12-19 NOTE — Progress Notes (Signed)
Pt blood pressure still elevated (99991111 systolic) despite PRN given by previous nurse. MD notified, since unable to pull sheath with a high systolic. Received order for 10mg  labetalol now. Will continue to monitor.

## 2014-12-19 NOTE — Progress Notes (Signed)
Sheath pulled and dressed with gauze and surgical tape. Site level 0. Pt educated on holding pressure when sneezing/coughing and to notify nurse for any signs of bleeding. No complaints at this time.

## 2014-12-19 NOTE — Progress Notes (Signed)
CARDIAC REHAB PHASE I   PRE:  Rate/Rhythm: 70 SR  BP:  Sitting: 138/72        SaO2: 95 RA  MODE:  Ambulation: 700 ft   POST:  Rate/Rhythm: 72 SR  BP:  Sitting: 97/57, recheck 106/57         SaO2: 95 RA  Pt ambulated 700 ft on RA, hand held assist, IV, steady gait, tolerated well.  Pt states he is "tired not from walking" but that he "didn't sleep much last night."  Denies, CP, SOB or dizziness. Upon returning to room pt BP lower, RN notified. Pt states he has no questions regarding completed education, states he would like to rest. Pt agrees to [facility] cardiac rehab, will send referral to [facility]. Pt returned to bed with call bell within reach.    [id number]  Lenna Sciara, RN, BSN 12/19/2014 10:32 AM

## 2014-12-19 NOTE — Discharge Instructions (Signed)
Coronary Angiogram with Stent °Coronary angiography with stent placement is a procedure to widen or open a narrow blood vessel of the heart (coronary artery). When a coronary artery becomes partially blocked, it decreases blood flow to that area. This may lead to chest pain or a heart attack (myocardial infarction). Arteries may become blocked by cholesterol buildup (plaque) in the lining or wall.  °A stent is a small piece of metal that looks like a mesh or a spring. Stent placement may be done right after a coronary angiography in which a blocked artery is found or as a treatment for a heart attack.  °LET YOUR HEALTH CARE PROVIDER KNOW ABOUT: °· Any allergies you have.   °· All medicines you are taking, including vitamins, herbs, eye drops, creams, and over-the-counter medicines.   °· Previous problems you or members of your family have had with the use of anesthetics.   °· Any blood disorders you have.   °· Previous surgeries you have had.   °· Medical conditions you have. °RISKS AND COMPLICATIONS °Generally, coronary angiography with stent is a safe procedure. However, problems can occur and include: °· Damage to the heart or its blood vessels.   °· A return of blockage.   °· Bleeding, infection, or bruising at the insertion site.   °· A collection of blood under the skin (hematoma) at the insertion site. °· Blood clot in another part of the body.   °· Kidney injury.   °· Allergic reaction to the dye or contrast used.   °· Bleeding into the abdomen (retroperitoneal bleeding). °BEFORE THE PROCEDURE °· Do not eat or drink anything after midnight on the night before the procedure or as directed by your health care provider.  °· Ask your health care provider about changing or stopping your regular medicines. This is especially important if you are taking diabetes medicines or blood thinners. °· Your health care provider will make sure you understand the procedure as well as the risks and potential problems  associated with the procedure.   °PROCEDURE °· You may be given a medicine to help you relax before and during the procedure (sedative). This medicine will be given through an IV tube that is put into one of your veins.   °· The area where the catheter will be inserted will be shaved and cleaned. This is usually done in the groin but may be done in the fold of your arm (near your elbow) or in the wrist.    °· A medicine will be given to numb the area where the catheter will be inserted (local anesthetic).   °· The catheter will be inserted into an artery using a guide wire. A type of X-ray (fluoroscopy) will be used to help guide the catheter to the opening of the blocked artery.   °· A dye will then be injected into the catheter, and X-rays will be taken. The dye will help to show where any narrowing or blockages are located in the heart arteries.   °· A tiny wire will be guided to the blocked spot, and a balloon will be inflated to make the artery wider. The stent will be expanded and will crush the plaque into the wall of the vessel. The stent will hold the area open like a scaffolding and improve the blood flow.   °· Sometimes the artery may be made wider using a laser or other tools to remove plaque.   °· When the blood flow is better, the catheter will be removed. The lining of the artery will grow over the stent, which stays where it was placed.   °  AFTER THE PROCEDURE  If the procedure is done through the leg, you will be kept in bed lying flat for about 6 hours. You will be instructed to not bend or cross your legs.   The insertion site will be checked frequently.   The pulse in your feet or wrist will be checked frequently.   Additional blood tests, X-rays, and electrocardiography may be done. Document Released: 02/21/2003 Document Revised: 01/01/2014 Document Reviewed: 02/23/2013 [facility] Patient Information 2015 [place], [place]. This information is not intended to replace advice given to you  by your health care provider. Make sure you discuss any questions you have with your health care provider. Acute Coronary Syndrome Acute coronary syndrome (ACS) is an urgent problem in which the blood and oxygen supply to the heart is critically deficient. ACS requires hospitalization because one or more coronary arteries may be blocked. ACS represents a range of conditions including:  Previous angina that is now unstable, lasts longer, happens at rest, or is more intense.  A heart attack, with heart muscle cell injury and death. There are three vital coronary arteries that supply the heart muscle with blood and oxygen so that it can pump blood effectively. If blockages to these arteries develop, blood flow to the heart muscle is reduced. If the heart does not get enough blood, angina may occur as the first warning sign. SYMPTOMS   The most common signs of angina include:  Tightness or squeezing in the chest.  Feeling of heaviness on the chest.  Discomfort in the arms, neck, back, or jaw.  Shortness of breath and nausea.  Cold, wet skin.  Angina is usually brought on by physical effort or excitement which increase the oxygen needs of the heart. These states increase the blood flow needs of the heart beyond what can be delivered.  Other symptoms that are not as common include:  Fatigue  Unexplained feelings of nervousness or anxiety  Weakness  Diarrhea  Sometimes, you may not have noticed any symptoms at all but still suffered a cardiac injury. TREATMENT   Medicines to help discomfort may include nitroglycerin (nitro) in the form of tablets or a spray for rapid relief, or longer-acting forms such as cream, patches, or capsules. (Be aware that there are many side effects and possible interactions with other drugs).  Other medicines may be used to help the heart pump better.  Procedures to open blocked arteries including angioplasty or stent placement to keep the arteries  open.  Open heart surgery may be needed when there are many blockages or they are in critical locations that are best treated with surgery. HOME CARE INSTRUCTIONS   Do not use any tobacco products including cigarettes, chewing tobacco, or electronic cigarettes.  Take one baby or adult aspirin daily, if your health care provider advises. This helps reduce the risk of a heart attack.  It is very important that you follow the angina treatment prescribed by your health care provider. Make arrangements for proper follow-up care.  Eat a heart healthy diet with salt and fat restrictions as advised.  Regular exercise is good for you as long as it does not cause discomfort. Do not begin any new type of exercise until you check with your health care provider.  If you are overweight, you should lose weight.  Try to maintain normal blood lipid levels.  Keep your blood pressure under control as recommended by your health care provider.  You should tell your health care provider right away about any  increase in the severity or frequency of your chest discomfort or angina attacks. When you have angina, you should stop what you are doing and sit down. This may bring relief in 3 to 5 minutes. If your health care provider has prescribed nitro, take it as directed.  If your health care provider has given you a follow-up appointment, it is very important to keep that appointment. Not keeping the appointment could result in a chronic or permanent injury, pain, and disability. If there is any problem keeping the appointment, you must call back to this facility for assistance. SEEK IMMEDIATE MEDICAL CARE IF:   You develop nausea, vomiting, or shortness of breath.  You feel faint, lightheaded, or pass out.  Your chest discomfort gets worse.  You are sweating or experience sudden profound fatigue.  You do not get relief of your chest pain after 3 doses of nitro.  Your discomfort lasts longer than 15  minutes. MAKE SURE YOU:   Understand these instructions.  Will watch your condition.  Will get help right away if you are not doing well or get worse.  Take all medicines as directed by your health care provider. Document Released: 08/17/2005 Document Revised: 08/22/2013 Document Reviewed: 12/19/2013 [facility] Patient Information 2015 [place], [place]. This information is not intended to replace advice given to you by your health care provider. Make sure you discuss any questions you have with your health care provider.

## 2014-12-19 NOTE — Discharge Summary (Signed)
[place], Scott [name]              ACCOUNT NO.:  [id number]  MEDICAL RECORD NO.:  [id number]  LOCATION:  2H24C                        FACILITY:  [facility]  PHYSICIAN:  Kierstyn Baranowski N. Terrence Dupont, M.D. DATE OF BIRTH:  28-Jan-1961  DATE OF ADMISSION:  12/13/2014 DATE OF DISCHARGE:  12/19/2014                              DISCHARGE SUMMARY   ADMITTING DIAGNOSES: 1. Acute-on-chronic decompensated congestive systolic heart failure. 2. Recent non-Q-wave myocardial infarction. 3. Multivessel coronary artery disease. 4. Hypertension. 5. Insulin-requiring diabetes mellitus. 6. Chronic kidney disease, stage 4. 7. Anemia of chronic disease. 8. Peripheral vascular disease. 9. Resolving cellulitis right foot with possible chronic     osteomyelitis, on chronic antibiotics. 10.Tobacco abuse. 11.Hypercholesteremia. 12.Positive family history of coronary artery disease.  DISCHARGE DIAGNOSES: 1. Compensated systolic heart failure. 2. Status post recent non-Q-wave myocardial infarction. 3. Multivessel coronary artery disease, status post staged PCI to LAD,     left circumflex/obtuse marginal/PDA with excellent angiographic     results. 4. Hypertension. 5. Insulin-requiring diabetes mellitus. 6. Chronic kidney disease, stage 4, stable. 7. Anemia of chronic disease. 8. Peripheral vascular disease. 9. Resolving cellulitis right foot/chronic osteomyelitis, on chronic     antibiotics. 10.Tobacco abuse. 11.Hypercholesteremia. 12.Positive family history of coronary artery disease.  MEDICATIONS:  Discharge home medications list; 1. Atorvastatin 80 mg one tablet daily. 2. Ferrous sulfate one tablet three times daily. 3. Isosorbide/hydralazine one tablet three times daily. 4. Nitrostat sublingual p.r.n. 5. Prasugrel 10 mg daily. 6. Spironolactone 25 mg daily. 7. Augmentin one tablet twice daily. 8. Aspirin 81 mg one tablet daily. 9. Carvedilol 25 mg twice daily. 10.Gabapentin 100 mg three times  daily. 11.NovoLog mix 70/30, 35 units two times daily. 12.Omeprazole 20 mg daily. 13.The patient has been advised to stop clopidogrel, isosorbide     mononitrate, lisinopril, and simvastatin.  DIET:  Low-salt, low-cholesterol 1800 calories ADA diet.  Monitor blood pressure and blood sugar daily and chart.  Post cardiac cath/PTCA instructions have been given.  FOLLOWUP:  Follow up with Dr. Neoma Laming early next week.  Follow up with Orthopedics at [facility] as before for his chronic osteomyelitis.  CONDITION AT DISCHARGE:  Stable.  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  He went to [facility] because of sudden onset of shortness of breath associated with diaphoresis.  The patient denies any chest pain, nausea, vomiting.  The patient does give history of PND, orthopnea, or leg swelling.  Denies palpitation, lightheadedness, or syncope.  The patient states he had similar presentation approximately 10 days ago and was ruled in for non-Q-wave myocardial infarction.  Subsequently, had cardiac catheterization and noted to have multivessel distal CAD and was treated medically.  The patient states since his discharge, his  breathing has been progressively getting worst and last Friday hardly able to breathe, so decided to go to the ED.  The patient was transferred here for multivessel PCI as the  patient did not have good targets for bypass.  PHYSICAL EXAMINATION:  GENERAL:  He was alert, awake, oriented x3. VITAL SIGNS:  Blood pressure was 159/81, pulse 73.  He was afebrile. HEENT:  Conjunctivae pink. NECK:  Supple.  Positive JVD. LUNGS:  Decreased breath sounds at bases with bibasilar rales. CARDIOVASCULAR:  S1, S2 was normal.  There was soft, systolic murmur, and S3 gallop. ABDOMEN:  Soft.  Bowel sounds were present.  Nontender. EXTREMITIES:  There is no clubbing, cyanosis.  There was 2+ edema, right leg with erythema, and minimal drainage from the surgical scar area noted.  LABORATORY DATA:  Blood cultures were negative.  EKG showed normal sinus rhythm with ST-T wave changes in lateral leads.  His sodium was 138, potassium 4.5, BUN was 24, creatinine 2.04.  His BNP was 272. Hemoglobin was 9.1, hematocrit 28.7, white count of 11.0.  His troponin I was 0.11, repeat was 0.08.  His cholesterol was 100, triglycerides 98, HDL 37, LDL was 43.  Last labs; sodium 141, potassium 3.7, BUN 13, creatinine 1.86.  Hemoglobin 8.5, hematocrit 26.2, white count of 9.0.  BRIEF HOSPITAL COURSE:  The patient was transferred from [facility] and was admitted to step-down unit.  X-ray of the foot was obtained, which showed evidence of chronic osteomyelitis. Orthopedic consultation was obtained with Dr. Alta Corning.  The patient was started on IV vancomycin and Zosyn with improvement in his redness and swelling and was advised to follow up with his orthopedic doctor in [place].  The patient also underwent staged PCI on April 15 to proximal and distal LAD and then yesterday on April 19 to left circumflex obtuse marginal 2 and PDA.  The patient tolerated both the procedures well.  His renal function has remained stable.  Phase 1 cardiac rehab was called.  The patient has been ambulating in the hallway and in the room without any problems.  The patient is very eager to go  home.  The patient was counseled extensively regarding lifestyle changes, diet, compliance with medication, and follow up with Orthopedics.  The patient also will see Dr. Neoma Laming next week.     Allegra Lai. Terrence Dupont, M.D.     MNH/MEDQ  D:  12/19/2014  T:  12/19/2014  Job:  [id number]

## 2014-12-19 NOTE — Progress Notes (Signed)
Discharged home by wheelchair accompanied by wife, discharge instructions given, belongings with pt.

## 2014-12-19 NOTE — Progress Notes (Signed)
Blood pressure still elevated. MD notified. Received order for 10mg  hydralazine IV and 12.5mg  coreg PO.

## 2014-12-21 NOTE — H&P (Signed)
PATIENT NAME:  Scott [name], Scott [name] MR#:  [id number] DATE OF BIRTH:  August 03, 1961  DATE OF ADMISSION:  07/12/2013  PRIMARY CARE PROVIDER: None.   ED REFERRING PHYSICIAN: Lennette Bihari [name]. Paduchowski, MD  CHIEF COMPLAINT: Left foot swelling.    HISTORY OF PRESENT ILLNESS: The patient is [name] 54 year old white male with history of  diabetes for about 7 to 8 years. He was on insulin up till 4 months ago, when he lost his health insurance and now he is unable to afford insulin and has been off of his medications. He states that he does have bilateral foot numbness and cannot feel much in both feet. He states that on Friday he found [name] tack in his tennis shoes which apparently was poking the bottom of his foot. Subsequently, he took out the tack and noticed 2 puncture wounds in the bottom of his feet. Subsequently, during the weekend it started getting red and warmth in the foot and on top of the foot. Therefore, he went to urgent care in [facility] where they gave him [name] tetanus shot and referred here for further evaluation. In the ED, he was noted to have [name] WBC count of 18,000. Blood sugar of 500s. We were asked to admit the patient for cellulitis. He otherwise complains of some low-grade fevers and some chills at home. He denies any chest pain, shortness of breath. No abdominal pain. No nausea, vomiting or diarrhea.   PAST MEDICAL HISTORY:  Diabetes which was diagnosed 7 to 8 years ago. History of hypertension, GERD.   PAST SURGICAL HISTORY: None.   ALLERGIES: None.   MEDICATIONS: None.   SOCIAL HISTORY: Smokes 1 pack per week. Denies any alcohol or drug use.   FAMILY HISTORY: Positive for hypertension in brother.   REVIEW OF SYSTEMS:  CONSTITUTIONAL: Complains of subjective fevers, fatigue, weakness, pain in the foot. No weight loss. No weight gain.  EYES: No blurred or double vision. No pain. No redness. No inflammation. No glaucoma. No cataract.  ENT: No tinnitus. No ear pain. No hearing loss. No seasonal or  year-round allergies. No epistaxis.  RESPIRATORY: Denies any cough, wheezing, hemoptysis. No COPD.  CARDIOVASCULAR: Denies any chest pain, orthopnea, edema or arrhythmia.  GASTROINTESTINAL: No nausea, vomiting, diarrhea. No abdominal pain. No hematemesis. No melena.  GENITOURINARY: Denies any dysuria, hematuria, renal calculus or frequency.  ENDOCRINE: Denies any polyuria, nocturia or thyroid problems.  HEMATOLOGIC AND LYMPHATIC: Denies anemia, easy bruisability or bleeding.  SKIN: No acne. Complains of erythema on the anterior aspect of his foot and bottom of his foot.  MUSCULOSKELETAL: No pain in his back, no gout.  NEUROLOGIC: Has bilateral foot numbness. No CVA. No TIA. No seizures.  PSYCHIATRIC: No anxiety. No insomnia. No ADD.   PHYSICAL EXAMINATION: VITAL SIGNS: Temperature 98.6, pulse 85, respirations 18, blood pressure 160/84, O2 98% on room air.  GENERAL: The patient is [name] well-developed male in no acute distress.  HEENT: Head atraumatic, normocephalic. Pupils equally round, reactive to light and accommodation. There is no conjunctival pallor. No scleral icterus. Nasal exam shows no drainage or ulceration. Oropharynx is clear without any exudate.  NECK: Supple without any JVD.  CARDIOVASCULAR: Regular rate and rhythm. No murmurs, rubs, clicks or gallops. PMI is not displaced.   ABDOMEN: Soft, nontender, nondistended. Positive bowel sounds x 4.  SKIN: Left foot on the anterior aspect below his toes there is erythema, warmth. Also at the bottom aspect of the foot there are 2 puncture wounds without any active drainage. There  is also warm there as well.  LYMPHATICS: No lymph nodes palpable.  VASCULAR: Good DP, PT pulses.  PSYCHIATRIC: Not anxious, depressed.  NEUROLOGIC: Awake, alert, and oriented x 3. No focal deficits.   LABORATORY AND RADIOLOGICAL DATA: Glucose 510, BUN 13, creatinine 1.38, sodium 133, potassium 4.6, chloride 96, CO2 is 28, calcium 8.6. WBC 18.4, hemoglobin 12.6,  platelet count is 283. X-ray of the foot is negative for any acute abnormalities.   ASSESSMENT AND PLAN: The patient is [name] 54 year old with diabetes, poor control, diabetic neuropathy, comes in with foot cellulitis after puncture wound.  1.  Cellulitis of the left foot after puncture wound. At this time, we will treat with IV antibiotics with Unasyn and vancomycin. Has already received tetanus. Will also get blood cultures.  2.  Diabetes, poorly controlled. Will start him on Levemir, sliding scale NovoLog. The patient will need drug assistance program from the company on discharge.  3.  Hypertension, poorly controlled. Creatinine [name] little elevated.  I will start him on lisinopril, monitor his renal function. We will also use p.r.n. hydralazine.  4.  Diabetic neuropathy.  5.  Gastroesophageal reflux disease. We will place him on Protonix.  6.  Miscellaneous. I will place him on Lovenox for deep vein thrombosis prophylaxis.    TIME SPENT: 55 minutes spent on this H and P.     ____________________________ Lafonda Mosses. Posey Pronto, MD shp:cs D: 07/12/2013 17:54:00 ET T: 07/12/2013 18:37:22 ET JOB#: [id number]  cc: Jah Alarid H. Posey Pronto, MD, <Dictator> Alric Seton MD ELECTRONICALLY SIGNED 07/14/2013 16:55

## 2014-12-21 NOTE — Op Note (Signed)
PATIENT NAME:  Scott [name], Scott [name] MR#:  [id number] DATE OF BIRTH:  16-Sep-1960  DATE OF PROCEDURE:  07/14/2013.  PREOPERATIVE DIAGNOSIS:  Puncture wound with abscess x 2, right foot.   POSTOPERATIVE DIAGNOSIS:  Puncture wound with abscess x 2, right foot.   PROCEDURE:  Incision and drainage, abscess, right foot x 2.   SURGEON:  Sharlotte Alamo, DPM.   ANESTHESIA:  Local MAC.   HEMOSTASIS:  Pneumatic tourniquet, right ankle, 250 mmHg.   ESTIMATED BLOOD LOSS:  Minimal.   CULTURES:  Deep wound cultures, right foot abscess.   MATERIALS:  None.   COMPLICATIONS:  None apparent.   OPERATIVE INDICATIONS:  This is [name] 54 year old diabetic male who lost his insurance and came off of his medications about four months go. He does have neuropathy and had [name] tack in his shoes and developed 2 puncture wounds. Increasing cellulitis and was admitted to the hospital.   OPERATIVE PROCEDURE:  The patient was taken to the Operating Room and placed on the table in [name] supine position. Following satisfactory sedation, the right foot was anesthetized with 10 mL of 0.5% Sensorcaine plain across the right forefoot. [name] pneumatic tourniquet was applied at the level of the right ankle and the foot was prepped and draped in the usual sterile fashion. The foot was exsanguinated and the tourniquet inflated to 250 mmHg.   Attention was then directed to the plantar aspect of the right foot where the 2 puncture wounds were identified beneath the second and fourth metatarsals. [name] linear incision approximately 2 cm beneath the second metatarsal site and 4 cm beneath the fourth metatarsal site was then made. Each of the puncture wounds was deepened via sharp and blunt dissection revealing significant purulence. There was some devitalized tissue noted beneath the fourth metatarsal with [name] small retained foreign body. Next both of the wounds were then excisionally debrided using [name] Versajet debrider on [name] setting of 7. The second metatarsal wound  was debrided full-thickness down to the deep subcutaneous tissues and the fourth metatarsal was debrided full-thickness down to the level of the capsule of the fourth metatarsal including superficial and deep subcutaneous tissue. There was noted to be good healthy tissue following debridement. There did not appear to be any probing towards the dorsal aspect of the foot at the fourth metatarsal. The wounds were then flushed with copious amounts of sterile saline and partially closed using 4-0 nylon simple interrupted sutures. The remaining open wounds were then packed with sterile saline gauze and 4 x 4's, fluffs. ABD and [name] Kerlix were then applied. The tourniquet was released. An Ace wrap was then applied to the right lower extremity for compression. The patient tolerated the procedure and anesthesia well and was transported to the PACU with vital signs stable and in good condition.   ____________________________ Sharlotte Alamo, DPM tc:jm D: 07/14/2013 11:55:38 ET T: 07/14/2013 12:37:16 ET JOB#: [id number]  cc: Sharlotte Alamo, DPM, <Dictator> [name] DPM ELECTRONICALLY SIGNED 07/31/2013 16:44

## 2014-12-21 NOTE — Consult Note (Signed)
PATIENT NAME:  Scott [name], Scott [name] MR#:  [id number] DATE OF BIRTH:  07-26-1961  DATE OF CONSULTATION:  07/13/2013  REFERRING PHYSICIAN:   CONSULTING PHYSICIAN:  Sharlotte Alamo, DPM  REASON FOR CONSULTATION: This is a 54 year old male, recently admitted for cellulitis with a couple of puncture wounds on the bottom of his right foot. He states that he first noticed these this past weekend, when he found a tack in his tennis shoes. His foot started to become painful, red and swollen. He went to an urgent care, where he was given a tetanus shot and sent to the Emergency Department for admission. He was admitted with elevated white count and uncontrolled blood sugars from his diabetes.   PAST MEDICAL HISTORY: Diabetes, currently uncontrolled, hypertension, gastroesophageal reflux disease.   PAST SURGICAL HISTORY: He denies any previous surgery.   MEDICATIONS: None.   ALLERGIES: NO KNOWN DRUG ALLERGIES.   SOCIAL HISTORY: Denies any alcohol or drug use. Smokes about 1 pack of cigarettes per week.   FAMILY HISTORY: Significant for diabetes, cancer, hypertension.   REVIEW OF SYSTEMS: Denies any current fever or chills. No weight loss or weight gain. Does have redness and swelling in the right foot from recent puncture wound. 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   PHYSICAL EXAMINATION: VASCULAR: DP and PT pulses are diminished but palpable. Capillary filling time is intact.  NEUROLOGICAL: Complete loss of protective threshold with a monofilament wire distally in the forefoot and toes. Proprioception is impaired.  INTEGUMENT: Skin is warm, dry, and mildly atrophic. Significant cellulitis with erythema and edema in the right forefoot. Two puncture wounds are noted, one beneath the fourth metatarsal area and the second metatarsal area on the right foot. Most of the cellulitis is related to the fourth toe area.  MUSCULOSKELETAL: Adequate range of motion. Muscle testing is  deferred. There is some pain on palpation of the fourth metatarsal area on the right foot from the puncture wound area.   X-RAYS: Three views of the right foot reveal a normal joint space pattern throughout the foot. There is an area of subcutaneous emphysema around the fourth metatarsophalangeal joint area noted on both the DP and oblique views. Does not clearly appear to be any cortical erosions. Some calcified blood vessels are noted, proximally, up around the ankle. No evidence of any fracture.   IMPRESSION: 1.  Diabetes, uncontrolled, with associated neuropathy.  2.  Puncture wound with cellulitis and gas-producing abscess, right forefoot.   PLAN: I discussed with the patient the need for surgical debridement and drainage of the abscess. We discussed risks and complications as well as the benefits. I discussed that no guarantees could be given as to healing with his current diabetic situation. We will plan for surgery tomorrow. He will be n.p.o. after midnight tonight. Obtain a consent form for incision and drainage, abscess, right foot. Again, plan for surgery tomorrow.   ____________________________ Sharlotte Alamo, DPM tc:cg D: 07/13/2013 19:46:43 ET T: 07/13/2013 23:33:11 ET JOB#: [id number]  cc: Sharlotte Alamo, DPM, <Dictator> Samiyah Stupka DPM ELECTRONICALLY SIGNED 07/31/2013 16:44

## 2014-12-21 NOTE — Consult Note (Signed)
Brief Consult Note: Diagnosis: major depression.   Patient was seen by consultant.   Consult note dictated.   Recommend further assessment or treatment.   Orders entered.   Comments: Psychiatry: Patient seen. Full note dictated. Patient very depressed. Pre-dates his current acute illness. Will start prozac. Needs follow up appointment with [facility]. Will follow. Does not need sitter in hospital.  Electronic Signatures: Gonzella Lex (MD)  (Signed 21-Nov-14 14:12)  Authored: Brief Consult Note   Last Updated: 21-Nov-14 14:12 by Gonzella Lex (MD)

## 2014-12-21 NOTE — Op Note (Signed)
PATIENT NAME:  Scott [name], Scott [name] MR#:  [id number] DATE OF BIRTH:  [birth year]  DATE OF PROCEDURE:  07/17/2013  SURGEON: Sharlotte Alamo, DPM.   PREOPERATIVE DIAGNOSIS: Abscess, right foot, status post puncture wound, repeat debridement.   POSTOPERATIVE DAIGNOSIS: Abscess, right foot, status post puncture wound, repeat debridement.    PROCEDURE: Incision and drainage with excisional debridement of devitalized tissue, right foot.   ANESTHESIA: Local MAC.   HEMOSTASIS: None.   ESTIMATED BLOOD LOSS: Minimal.   MATERIALS: None.   DRAINS: 4 x 4 saline gauze packed into the open wounds.   COMPLICATIONS: None apparent, other than minimal bleeding.  OPERATIVE INDICATIONS: This is [name] 54 year old male, poorly controlled diabetic, who came off of his medications about 4 months ago, who sustained [name] puncture wound. It was previously debrided [name] few days ago, but continues to have elevated white count and redness to the right foot.   OPERATIVE PROCEDURE: The patient was taken to the operating room and placed on the table in the supine position. Following satisfactory sedation, the right foot was anesthetized with 10 mL of 0.5% Sensorcaine plain around the right forefoot. The foot was prepped and draped in the usual sterile fashion. Attention was then directed to the right foot where the previous sutures from the fourth metatarsal I and D area were removed. There was noted to be some purulent and devitalized tissue within the wound both distally and proximally. The incision was lengthened approximately 3 cm and deepened via sharp and blunt dissection. The distal aspect of incision was also lengthened about [name] centimeter towards the base of the fourth toe and deepened in the same manner.   Next, using [name] VersaJet debrider on [name] setting of 7, excisional debridement was performed of the devitalized and purulent material from the plantar aspect of the wound. There was noted to be [name] communication between the base of the  third and fourth toes dorsally, so [name] dorsal incision was made approximately 3 cm in the interspace. It was deepened and there was noted to be healthy-appearing tissues along the dorsal aspect. The wounds were then flushed with copious amounts of pulsed lavage saline dorsally and plantarly. The incisions were then reapproximated using 5-0 nylon simple interrupted sutures at the proximal and distal aspect of the plantar incision and the proximal aspect of the dorsal incision with [name] open wound still present for packing. The wounds were then packed with sterile saline soaked gauze into the dorsal and plantar wounds as well as the previous area beneath the second. 4 x 4's, ABDs and Kerlix were then applied followed by an Ace wrap. The patient tolerated the procedure and anesthesia well and was transported to post anesthesia care unit with vital signs stable and in good condition.    ____________________________ Sharlotte Alamo, DPM tc:aw D: 07/17/2013 13:26:30 ET T: 07/17/2013 13:33:26 ET JOB#: [id number]  cc: Sharlotte Alamo, DPM, <Dictator> Kaysen Deal DPM ELECTRONICALLY SIGNED 07/31/2013 16:44

## 2014-12-21 NOTE — Consult Note (Signed)
PATIENT NAME:  Scott [name], Scott [name] MR#:  [id number] DATE OF BIRTH:  1961/01/21  DATE OF CONSULTATION:  07/21/2013  CONSULTING PHYSICIAN:  Gonzella Lex, MD  IDENTIFYING INFORMATION AND REASON FOR CONSULT: A 54 year old man currently in the hospital for acute cellulitis resulting in planned amputation of the toe. Consultation for symptoms of severe depression.   HISTORY OF PRESENT ILLNESS: Information obtained from the patient and the chart. The patient tells me currently he is feeling very depressed. His mood is sad and down and negative all the time. He frequently has negative thoughts about himself. He feels useless and hopeless. He has been feeling depressed, probably for much of a year at least, since he lost his last job. It has been getting worse recently.   Over the last month or two, he has found himself having fleeting thoughts of wishing that he were dead or even thinking about killing himself. He does not report any psychotic symptoms. The patient has not talked about his symptoms with anyone, neither a doctor nor his girlfriend or anyone else in his family. He has not gotten any treatment for it. He is not drinking or abusing drugs. The patient has felt very tired and down, low energy. Sleep is very poor and chaotic. Thoughts are negative. General feeling of helplessness about him. Lack of interest in normal activities. Has not gone fishing in a year even though that used to be his favorite thing to do.   PAST PSYCHIATRIC HISTORY: Denies any history of psychiatric treatment in the past. No history of inpatient treatment. No hospitalization. No medication. No evaluation by a counsel or a doctor. He denies that he has had serious depression previously that he can recall. Denies any history of substance abuse.   SUBSTANCE ABUSE HISTORY: He says he drinks almost never now and does not use any drugs. Denies any past history of alcohol or drug abuse.   SOCIAL HISTORY: The patient has been out of  work since 2013. 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 He has looked for work with no success. He lives with his long-term girlfriend. He says their relationship is pretty good. He has adult children and grandchildren he interacts with some of the time. He says that his usual daily routine is to sit around the house watching television, doing nothing or just sleeping during the day. He has no income and has not tried to apply for disability.   FAMILY HISTORY: Two first cousins committed suicide.   PAST MEDICAL HISTORY: The patient has diabetes and should be taking insulin, but he has had no insurance in a long time and has not been using any insulin, so his blood sugars have probably been quite out of control. He has high blood pressure. Currently he has a cellulitis that developed in his toe and there is a planned amputation this afternoon.   CURRENT MEDICATIONS: It sounds like he was taking essentially nothing when he came into the hospital, but here right now, he is taking hydrochlorothiazide 25 mg a day, Zestril 10 mg per day, pantoprazole 40 mg a day, vancomycin for the infection, Lovenox injection, insulin, pain medication.   ALLERGIES: No known drug allergies.   MENTAL STATUS EXAMINATION: Disheveled, not very well-groomed man who looks his stated age, cooperative with the interview. He seems sincere. Made good eye contact. Psychomotor activity sluggish. Affect very down and flat. He cried for much of the interview. Mood  stated as depressed. Thoughts are slow but lucid. No obvious delusions. Denies hallucinations. He has suicidal thoughts but no intent or plan of acting on it. No homicidal ideation. Good judgment and insight overall. Full cognitive testing not done. Intelligence probably in the average range. Short-term memory seems grossly intact.   PHYSICAL EXAMINATION: Full physical not done right now. His blood pressure is 146/92, pulse 80,  respirations 20, temperature 98.9.   LABORATORY RESULTS: Admission labs: I do not see that there was a drug screen done. He is anemic. He was admitted with a high white count. Abnormalities consistent with his diabetes.   ASSESSMENT: This is a 54 year old man who is giving me a history of major depression that predates his current infection. He is very down and negative in his thinking, but there is no sign of psychosis. He has had suicidal thoughts. He does not have his guns at home anymore. He does not have a specific plan in mind and does not have any current intention of acting on it. The patient needs to begin treatment for depression and have follow-up treatment after he leaves the hospital.   TREATMENT PLAN: Start Prozac 20 mg a day starting tomorrow morning. Side effects and use of medicine explained to the patient and he agrees. I have asked social work to make an appointment for outpatient treatment for him at discharge. If he were to be depressed enough or suicidal enough to need current hospitalization for safety after he no longer needs medical hospitalization, we can re-evaluate for psychiatric hospitalization, but hopefully it is not going to be necessary. I will check in on him after the weekend. Supportive and educational counseling done.   DIAGNOSIS, PRINCIPAL AND PRIMARY:  AXIS I: Major depression, single, severe.   SECONDARY DIAGNOSES:  AXIS I: No further.  AXIS II: No diagnosis.  AXIS III: Diabetes, infected foot pending amputation, high blood pressure, pain, gastric reflux symptoms.  AXIS IV: Severe multiple stresses.  AXIS V: Current functioning, 28.  ____________________________ Gonzella Lex, MD jtc:np D: 07/21/2013 14:21:27 ET T: 07/21/2013 15:02:33 ET JOB#: [id number]  cc: Gonzella Lex, MD, <Dictator> Gonzella Lex MD ELECTRONICALLY SIGNED 07/21/2013 19:09

## 2014-12-21 NOTE — Op Note (Signed)
PATIENT NAME:  Scott [name], Scott [name] MR#:  [id number] DATE OF BIRTH:  [birth year]  DATE OF PROCEDURE:  07/19/2013  PREOPERATIVE DIAGNOSES: 1.  Peripheral arterial disease with ulceration and infection right lower extremity.  2.  Poorly-controlled diabetes mellitus.   POSTOPERATIVE DIAGNOSES: 1.  Peripheral arterial disease with ulceration and infection right lower extremity.  2.  Poorly-controlled diabetes mellitus.   PROCEDURES: 1.  Catheter placement to right peroneal artery and posterior tibial artery from left femoral approach.  2.  Aortogram and selective right lower extremity angiogram.  3.  Percutaneous transluminal angioplasty of right peroneal artery with 3 mm diameter angioplasty balloon.  4.  Percutaneous transluminal angioplasty of right posterior tibial artery, tibioperoneal trunk and distal popliteal artery with 3 and 4 mm diameter conventional angioplasty balloon.  5.  Percutaneous transluminal angioplasty of right tibioperoneal trunk, distal popliteal and proximal posterior tibial artery with Lutonix drug-eluting 4 mm diameter angioplasty balloon.  6.  StarClose closure device, left femoral artery.   SURGEON: Algernon Huxley, M.D.   ANESTHESIA: Local with moderate conscious sedation.   ESTIMATED BLOOD LOSS: Minimal.   FLUOROSCOPY TIME: Approximately 6 minutes.   INDICATION FOR PROCEDURE: This is [name] 54 year old white male who was admitted to the hospital with [name] diabetic foot infection and ulceration on the right. We are asked to assess his vascular status. His pulses were not easily palpable and angiogram was performed for further evaluation and potential treatment. Risks and benefits were discussed. Informed consent was obtained.   DESCRIPTION OF PROCEDURE: The patient was brought to the vascular and interventional radiology suite. Groins were shaved and prepped and [name] sterile surgical field was created. The left femoral head was localized with fluoroscopy and the left femoral  artery was accessed without difficulty with [name] [name] needle. [name] J-wire and 5-French sheath was placed. Pigtail catheter was then placed in the aorta at the L1-L2 level and an AP aortogram was performed. This demonstrated normal renal arteries.  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 The right iliac artery was patent and not aneurysmal.  I then hooked the aortic bifurcation and advanced to the right femoral head. Selective right lower extremity angiogram was then performed. This demonstrated [name] patent common femoral artery, profunda femoris artery, superficial femoral artery. The popliteal artery was patent until the distal portion where it tapered down and there was stenosis that tracked down into the tibioperoneal trunk. The posterior tibial artery was occluded proximally and stenotic distally. The peroneal artery had several areas of stenosis over the first 10 to 15 cm.  The  patient was heparinized. [name] 6-French Ansell sheath was placed over [name] [name] advantage wire and [name] [name] catheter was then placed. I exchanged for an 0.018 wire and easily crossed the  tibioperoneal trunk stenosis and posterior tibial occlusion and parked [name] wire into the foot through the posterior tibial artery. [name] 3 mm diameter x 30 cm length angioplasty balloon was inflated from the ankle up to the popliteal artery. There was still significant stenosis in the proximal posterior tibial artery and tibioperoneal trunk, and this lesion was treated with [name] 4 mm diameter angioplasty balloon. With residual stenosis following this inflation, [name] 4 mm diameter, Lutonix angioplasty balloon was inflated with good angiographic result. I then turned my attention to the peroneal artery. I was able to gain access to this without difficulty and identify the lesions crossing them with an 0.018 wire without difficulty. [name] 3 mm diameter angioplasty  balloon was inflated from the mid peroneal artery up to  the popliteal artery. Again, waists were taken which resolved with angioplasty. At this point, I elected to terminate the procedure. The sheath was pulled back to the ipsilateral external iliac artery and oblique arteriogram was performed. StarClose closure device was deployed in the usual fashion with excellent hemostatic result. The patient tolerated the procedure well and was taken to the recovery room in stable condition.    ____________________________ Algernon Huxley, MD jsd:cs D: 07/19/2013 15:33:59 ET T: 07/19/2013 15:54:52 ET JOB#: [id number]  cc: Algernon Huxley, MD, <Dictator> Algernon Huxley MD ELECTRONICALLY SIGNED 08/07/2013 9:21

## 2014-12-21 NOTE — Discharge Summary (Signed)
PATIENT NAME:  Scott [name], Scott [name] A MR#:  [id number] DATE OF BIRTH:  March 02, 1961  DATE OF ADMISSION:  07/12/2013 DATE OF DISCHARGE:  07/23/2013  This summary should cover from November 22 to November 23. For previous H and P and interim discharge summary, please see the dictation on day of admission and interim summary dictated on November 21 by Dr. Benjie Karvonen.   DISCHARGE DIAGNOSES:  1.  Cellulitis of the left foot resulting in gangrene, status post debridement and amputation of the right 4th toe.  2.  Uncontrolled diabetes.  3.  Medication noncompliance.  4.  Hypertension.  5.  GERD. 6.  Depression.   DISCHARGE MEDICATIONS: Lisinopril 10 mg a day, fluoxetine 20 mg daily, metformin 500 mg two times a day, insulin 70/30 at 35 units two times a day, hydrochlorothiazide 25 mg daily, Cipro 500 mg every 12 hours for 10 days, Augmentin 875/125 mg one tab every 12 hours for 10 days, acetaminophen/hydrocodone 325/5 mg every 4 hours as needed for pain.   He will be getting discharged with home health.   DIET: Low sodium, low fat, low cholesterol, ADA diet.   ACTIVITY: As tolerated.   FOLLOWUP: Please follow with [name] on Tuesday. Please follow with PCP within 1 to 2 weeks.   HOSPITAL COURSE: The patient did well while in the hospital. He did have amputation of the 4th digit on the right for the cellulitis and the gangrene. The cultures were growing enterobacter, enterococcus, and E. coli, and he will be discharged on Augmentin and Cipro for 10 days. He will follow up with [name] from podiatry on Tuesday for further care.   In regards to his diabetes, the sugars were acceptable. He will be discharged on 70/30 insulin and metformin.   As he has no insurance, a 10-day supply was given to him and also information about [facility] was given to him so he can go request help with his medications.   TOTAL TIME SPENT: 32 minutes.   CODE STATUS: FULL CODE.   ____________________________ Vivien Presto,  MD sa:np D: 07/24/2013 14:56:00 ET T: 07/24/2013 22:59:21 ET JOB#: [id number]  cc: Vivien Presto, MD, <Dictator> Vivien Presto MD ELECTRONICALLY SIGNED 08/07/2013 20:00

## 2014-12-21 NOTE — Consult Note (Signed)
CHIEF COMPLAINT and HISTORY:  Subjective/Chief Complaint Right foot puncture wounds, nonhealing   History of Present Illness 54 year old white male admitted 11/12 with right foot infection/cellulitis secondary to puncture wound from tack in tennis shoe. He has uncontrolled diabetes while being off medications as that he lost his health insurance. He is s/p I&D of right foot abscess with debridment by [name]. Notes to have minimal intraoperative bleeding and issues with healing. We were consulted for vascular evaluation.   PAST MEDICAL/SURGICAL HISTORY:  Past Medical History:   Hypertension:    Diabetes:   ALLERGIES:  Allergies:  No Known Allergies:   Family and Social History:  Family History Hypertension   Social History positive  tobacco, 1 ppd   + Tobacco Current (within 1 year)   Place of Living Home   Review of Systems:  Fever/Chills No   Sputum No   Abdominal Pain No   Diarrhea No   Constipation No   Nausea/Vomiting No   Chest Pain No   Physical Exam:  GEN no acute distress   NECK supple  No masses   RESP normal resp effort  clear BS   CARD regular rate  no carotid bruits  No LE edema   ABD denies tenderness  soft   EXTR negative edema, Decreased pedal pulses, difficult to palpate, right foot dressed   NEURO cranial nerves intact   PSYCH alert, A+O to time, place, person   LABS:  Laboratory Results: Routine Hem:    18-Nov-14 05:47, CBC Profile  WBC (CBC) 17.4  RBC (CBC) 3.91  Hemoglobin (CBC) 11.9  Hematocrit (CBC) 34.5  Platelet Count (CBC) 431  MCV 89  MCH 30.5  MCHC 34.4  RDW 12.7  Neutrophil % 71.2  Lymphocyte % 17.4  Monocyte % 9.2  Eosinophil % 1.9  Basophil % 0.3  Neutrophil # 12.4  Lymphocyte # 3.0  Monocyte # 1.6  Eosinophil # 0.3  Basophil # 0.0  Result(s) reported on 18 Jul 2013 at 06:18AM.   ASSESSMENT AND PLAN:  Assessment/Admission Diagnosis 54 year old white male admitted 11/12 with right foot  infection/cellulitis secondary to puncture wound from tack in tennis shoe. He has uncontrolled diabetes while being off medications as that he lost his health insurance. He is s/p I&D of right foot abscess with debridment by [name]. Notes to have minimal intraoperative bleeding and issues with healing. We were consulted for vascular evaluation.   Plan Longstanding diabetes which is uncontrolled with right foot infection/cellulitis/non healing wound s/p I&D/debridment. Would be best to evaluate and optimize perfusion with angiogram. D/w [name]. We will add him to the schedule for angiogram tomorrow. Discussed with patient the risks and benefits, and he wishes to proceed.   Electronic Signatures: Su Grand (PA-C)  (Signed [phone] 09:31)  Authored: Chief Complaint and History, PAST MEDICAL/SURGICAL HISTORY, ALLERGIES, Family and Social History, Review of Systems, Physical Exam, LABS, Assessment and Plan   Last Updated: 18-Nov-14 09:31 by Su Grand (PA-C)

## 2014-12-21 NOTE — Op Note (Signed)
PATIENT NAME:  Scott [name], Scott [name] MR#:  [id number] DATE OF BIRTH:  1961-02-23  DATE OF OPERATION:  07/21/2013  SURGEON: Sharlotte Alamo, DPM  PREOPERATIVE DIAGNOSIS: Cellulitis with gangrenous changes, right fourth toe.   POSTOPERATIVE DIAGNOSES: Cellulitis with gangrenous changes, right fourth toe.   PROCEDURE: Partial fourth ray amputation, right foot.   ANESTHESIA: Local MAC.   HEMOSTASIS: None.   ESTIMATED BLOOD LOSS:  Minimal, 5 to 10 mL at most.  MATERIALS: Stimulan rapid cure antibiotic beads with Unasyn and gentamicin.   PATHOLOGY: Right fourth toe and distal metatarsal.   COMPLICATIONS: None apparent.   OPERATIVE INDICATIONS: This is [name] 54 year old male with 2 previous debridements, status post puncture wounds to his right foot. Continued with nonhealing and was revascularized on this past Wednesday. Continued to have elevated white count with signs of some gangrenous changes to the fourth toe, and decision made for amputation of the fourth toe and debridement of the metatarsal head.   OPERATIVE PROCEDURE: The patient was taken to the Operating Room and placed on the table in the supine position. Following satisfactory sedation, the right foot was anesthetized with 11 mL of 0.5% Sensorcaine plain across the forefoot. The foot was then prepped and draped in the usual sterile fashion. Attention was then directed to the distal aspect of the right foot, where an elliptical incision was made from dorsal to plantar, encompassing the base of the fourth toe. Incision was carried sharply down to the level of the bone. The extensor tendon was resected and [name] bone saw was used to transect the distal fourth metatarsal. The toe and metatarsal head and joint were then removed in toto. It was noted that there was minimal bleeding of the tissues during the initial procedure. There was some continued purulent and devitalized tissue noted along the base of the third toe, extending up underneath the third  metatarsal and down to the proximal edge of the wound on the medial and lateral aspects. Using [name] Versa-jet debrider on [name] setting of 7, excisional debridement was made of the devitalized tissue, full-thickness down to the level of the capsular tissues and joint. There was noted to be some bleeding upon debridement. The wound was then flushed with copious amounts of sterile saline. Rapid cure Stimulan beads with ampicillin and gentamicin mixed in were then applied into the recesses of the wound. The plantar proximal aspect was then sutured using 4-0 Vicryl simple interrupted sutures, with [name] couple of sutures placed dorsally, and the remainder of the wound was left open. Some additional beads were then placed into the wound, and this was covered with Mepitel and [name] dry bandage, followed by 4 x 4's, ABD and Kerlix. An Ace wrap was then applied. The patient tolerated the procedure and anesthesia well, and was transported to the PACU with vital signs stable and in good condition.      ____________________________ Sharlotte Alamo, DPM tc:mr D: 07/21/2013 20:17:28 ET T: 07/21/2013 21:52:53 ET JOB#: [id number]  cc: Sharlotte Alamo, DPM, <Dictator> Kairy Folsom DPM ELECTRONICALLY SIGNED 07/31/2013 16:44

## 2014-12-24 LAB — SURGICAL PATHOLOGY

## 2014-12-30 NOTE — H&P (Signed)
PATIENT NAME:  Scott [name], Scott [name] MR#:  [id number] DATE OF BIRTH:  [birth year]  DATE OF ADMISSION:  10/03/2014  PRIMARY CARE PROVIDER: At [facility].   WOUND CARE PHYSICIAN: Dr. Quay Burow.   CHIEF COMPLAINT: Right foot pain, ulcer and redness.   HISTORY OF PRESENT ILLNESS: A 54 year old, Caucasian male patient with history of hypertension, diabetes, tobacco abuse, had his fourth toe amputated 2 years prior by podiatry, [name]. Since then, he has been followed by the wound clinic with poor healing of the wound. Three weeks back, he noticed that he had some redness, purulent discharge, and was placed on doxycycline for the same. With no response to outpatient antibiotics, and worsening pain, swelling and redness, the patient presented to the Emergency Room from his wound clinic. Here, the patient has been found to have a white count of 19,000, acute renal failure, elevated lactic acid, and is being admitted to the hospital. The patient complains of significant worsening pain. He has not had any nausea, vomiting, diarrhea. Has had decreased urination.   PAST MEDICAL HISTORY: 1. Insulin-dependent diabetes mellitus.  2. Hypertension.  3. GERD. 4. Chronic right foot wound.  5. Tobacco abuse.   PAST SURGICAL HISTORY: Amputation of his fourth toe of the right foot.   ALLERGIES: No known drug allergies.   HOME MEDICATIONS:  1. Insulin 70/30 at 40 units 2 times a day.  2. Lisinopril 10 mg. 3. Hydrochlorothiazide 25 mg daily.  4. Fluoxetine 20 mg daily.  5. Doxycycline 100 mg oral 2 times a day.  6. Acetaminophen hydrocodone 1 tablet every 6 hours as needed.  7. Metformin 500 mg oral 2 times a day.   FAMILY HISTORY: No medical problems in his parents, who died when he was young.   SOCIAL HISTORY: The patient smokes 1 to 2 packs a week. Does not drink any alcohol. No illicit drug use. Ambulates on his own. Lives at home with his girlfriend.   CODE STATUS: FULL CODE.   REVIEW OF  SYSTEMS:   CONSTITUTIONAL: Complains of fatigue and weakness.  EYES: No blurred vision, pain or redness.  ENT: No tinnitus, ear pain, or hearing loss.  RESPIRATORY: No cough, wheeze, hemoptysis.  CARDIOVASCULAR: No chest pain, orthopnea, edema.  GASTROINTESTINAL: No nausea, vomiting, diarrhea, or abdominal pain.  GENITOURINARY: No dysuria, hematuria, or frequency.  ENDOCRINE: No polyuria, nocturia, thyroid problems. HEMATOLOGIC AND LYMPHATIC: No anemia, easy bruising or bleeding.  INTEGUMENTARY: Has a chronic wound of his right foot along with redness and pain.  MUSCULOSKELETAL: No back pain or arthritis.  NEUROLOGIC: No focal numbness or weakness.  PSYCHIATRY: No anxiety or depression.   PHYSICAL EXAMINATION:  VITAL SIGNS: Temperature 97.7, pulse of 89, blood pressure 128/94, saturating 100% on room air.  GENERAL: Obese, Caucasian male patient lying in bed, overall seems comfortable, conversational, cooperative with examination.  PSYCHIATRIC: Alert and oriented x 3, pleasant.  HEENT: Atraumatic, normocephalic. Mucosa was dry and pink. External ears and nose normal. No pallor. No icterus. Pupils bilaterally equal and reactive to light.  NECK: Supple. No thyromegaly. No palpable lymph nodes. Trachea midline. No carotid bruit or JVD.  CARDIOVASCULAR: S1, S2, without any murmurs heard. Peripheral pulses 2+. No edema.  RESPIRATORY: Normal work of breathing. Clear to auscultation bilaterally.  GASTROINTESTINAL: Soft abdomen, nontender. Bowel sounds present. No organomegaly palpable.  SKIN: Warm and dry. Has redness extending along the dorsum of the foot onto his leg, also extending along the medial side of the foot. He has a  small area of erythema in the plantar area of the foot with some fluctuance. Has an ulcer in his toe amputation site with purulent discharge. No bleeding.  NEUROLOGICAL: Motor strength 5/5 in upper and lower extremities. Sensation to fine touch intact all over. LYMPHATICS:  No cervical lymphadenopathy.   LABORATORY STUDIES: Glucose 453, BUN 26, creatinine 2.3, sodium 133, potassium 4.2, chloride 98. AST, ALT, alkaline phosphatase, bilirubin normal. Troponin less than 0.02. WBC 19.8, platelets 513,000, hemoglobin 11.8, MCV of 90.   INR 1.1.   Lactic acid 2.6.   Recent cultures from his wound in December 2015 and 09/28/2014 have grown MSSA, polymicrobial gram-negative rods, gram-positive cocci in clusters   Chest x-ray shows nothing acute.   MRI of the right foot with and without contrast done on 08/17/2014 showed a plantar soft tissue wound at the level of the head of the third metatarsal without any abscess or osteomyelitis, and soft tissue edema.   ASSESSMENT AND PLAN: 1. Right foot chronic ulceration with worsening infection and surrounding cellulitis. There is a high suspicion for osteomyelitis in this patient. He has failed outpatient therapy with oral antibiotics. He will be admitted as inpatient. Blood cultures have been ordered in the Emergency Room. We will start him with broad-spectrum antibiotics with vancomycin and Zosyn. We will consult podiatry for further management of the wound. Depending on the MRI results, he might need debridement versus amputation. Further management as per MRI results and culture results.  2. Acute renal failure. The patient seems to have some underlying chronic kidney disease stage III. At this time, his creatinine has worsened secondary to the infection, but we will also check a CK level. We will bolus 2 liters of normal saline, put him on maintenance fluids, and repeat laboratories in the morning.  3. Anemia of chronic disease, stable.  4. Uncontrolled diabetes mellitus. The patient seems to be compliant with his insulin 70/30, but has poor outpatient follow-up with primary care physician. We will continue his 70/30 at his home dose and up-titrate as needed. The patient will be on a diabetic diet and sliding scale insulin. We  will also check HbA1c.  5. Hypertension. Hold lisinopril and hydrochlorothiazide secondary to acute renal failure. We will use IV p.r.n. medications. Presently this seems to be fairly controlled.  6. Tobacco abuse. The patient has been counseled to quit smoking for greater than 3 minutes.  7. Deep vein thrombosis prophylaxis with heparin.   TIME SPENT TODAY ON THIS CASE: 55 minutes.   ____________________________ Leia Alf Dannie Woolen, MD srs:JT D: 10/03/2014 12:23:22 ET T: 10/03/2014 12:39:55 ET JOB#: [id number]  cc: Alveta Heimlich R. Dionisia Pacholski, MD, <Dictator> [facility] [place], [place] Dr. Mamie Nick MD ELECTRONICALLY SIGNED 10/08/2014 3:58

## 2014-12-30 NOTE — Consult Note (Signed)
General Aspect ASO with ulceration of the right foot   Present Illness The patient is a 54 year old, male patient with history of hypertension, diabetes and tobacco abuse.  He is s/p fourth toe amputated 2 years prior by [name]. Since then, he has been followed by the wound clinic with poor healing of the wound. Three weeks back, he noticed that he had some redness, purulent discharge, and was placed on doxycycline. With no response to outpatient antibiotics, and worsening pain, swelling and redness, the patient presented to the Emergency Room from his wound clinic. Here, the patient has been found to have a white count of 19,000, acute renal failure, elevated lactic acid, and is being admitted to the hospital. The patient complains of significant worsening pain. He has not had any nausea, vomiting, diarrhea. Has had decreased urination.   PAST MEDICAL HISTORY: 1. Insulin-dependent diabetes mellitus.  2. Hypertension.  3. GERD. 4. Chronic right foot wound.  5. Tobacco abuse.   PAST SURGICAL HISTORY: Amputation of his fourth toe of the right foot.   Home Medications: Medication Instructions Status  doxycycline hyclate 100 mg oral capsule 1 cap(s) orally 2 times a day Active  insulin aspart-insulin aspart protamine 30 units-70 units/mL subcutaneous suspension 40 unit(s) subcutaneous 2 times a day Active  hydrochlorothiazide 25 mg oral tablet 1 tab(s) orally once a day Active  lisinopril 10 mg oral tablet 1 tab(s) orally once a day Active  metFORMIN 500 mg oral tablet 1 tab(s) orally 2 times a day Active  omeprazole 20 mg oral delayed release capsule 1 cap(s) orally once a day Active  simvastatin 20 mg oral tablet 1 tab(s) orally once a day (at bedtime) Active    No Known Allergies:   Case History:  Family History Non-Contributory   Social History positive  tobacco, negative ETOH, negative Illicit drugs   Review of Systems:  ROS No TIA/stroke/seizure No heat or cold  intolerance No dysuria/hematuria No blurry or double vision No tinnitus or ear pain No rashes or ulcer No suicidal ideation or psychosis No signs of bleeding or easy bruising No SOB/DOE, orthopnea, or sputum No palpitations or chest pain No N/V/D or abdominal pain No joint pain or joint swelling No fever or chills No unintentional weight loss or gain   Physical Exam:  GEN well developed, well nourished, ill appearing   HEENT hearing intact to voice, moist oral mucosa   NECK supple  trachea midline   RESP normal resp effort  no use of accessory muscles   CARD regular rate  LE edema present  positive JVD   ABD denies tenderness  soft   EXTR positive edema, nonhelaing wound right foot changes concitent with cellulitis  The femoral and popliteal  pulses are 2+ bilaterally, pedal pulses are not palpable   SKIN positive rashes, positive ulcers, skin turgor poor   NEURO cranial nerves intact, follows commands, motor/sensory function intact   PSYCH alert, A+O to time, place, person   Nursing/Ancillary Notes: **Vital Signs.:   05-Feb-16 15:30  Vital Signs Type Q 8hr  Temperature Temperature (F) 98.5  Celsius 36.9  Temperature Source oral  Pulse Pulse 92  Respirations Respirations 18  Systolic BP Systolic BP 814  Diastolic BP (mmHg) Diastolic BP (mmHg) 77  Mean BP 101  Pulse Ox % Pulse Ox % 95  Pulse Ox Activity Level  At rest  Oxygen Delivery Room Air/ 21 %   Pathology:  05-Feb-16 00:00   Pathology Report CASE: [id number]  SPECIMEN SUBMITTED:  CLINICAL HISTORY: cellulitis abscess right foot same/incision and drainage, excision of infected bone, soft tissue right foot  Routine Micro:  05-Feb-16 08:06   Micro Text Report WOUND AER/ANAEROBIC CULT   COMMENT                   HOLDING FOR POSSIBLE PATHOGEN   GRAM STAIN                RARE WHITE BLOOD CELLS   GRAM STAIN                NO ORGANISMS SEEN   ANTIBIOTIC                       Specimen Source RT  PROX PHALANX  Culture Comment HOLDING FOR POSSIBLE PATHOGEN  Gram Stain 1 RARE WHITE BLOOD CELLS  Gram Stain 2 NO ORGANISMS SEEN  Result(s) reported on 06 Oct 2014 at 03:22PM.  Routine Chem:  05-Feb-16 03:53   Glucose, Serum  145  BUN 18  Creatinine (comp)  2.05  Sodium, Serum 138  Potassium, Serum 3.8  Chloride, Serum 105  CO2, Serum 24  Calcium (Total), Serum  8.0  Anion Gap 9  Osmolality (calc) 280  eGFR (African American)  43  eGFR (Non-African American)  36 (eGFR values <3m/min/1.73 m2 may be an indication of chronic kidney disease (CKD). Calculated eGFR, using the MRDR Study equation, is useful in  patients with stable renal function. The eGFR calculation will not be reliable in acutely ill patients when serum creatinine is changing rapidly. It is not useful in patients on dialysis. The eGFR calculation may not be applicable to patients at the low and high extremes of body sizes, pregnant women, and vegetarians.)  Routine Hem:  05-Feb-16 03:53   WBC (CBC)  21.2  RBC (CBC)  3.25  Hemoglobin (CBC)  9.6  Hematocrit (CBC)  28.8  Platelet Count (CBC)  474  MCV 89  MCH 29.6  MCHC 33.5  RDW 13.3  Neutrophil % 76.2  Lymphocyte % 13.6  Monocyte % 8.9  Eosinophil % 0.8  Basophil % 0.5  Neutrophil #  16.2  Lymphocyte # 2.9  Monocyte #  1.9  Eosinophil # 0.2  Basophil # 0.1 (Result(s) reported on 05 Oct 2014 at 04:53AM.)   MRI:    03-Feb-16 17:08, MRI Foot Right Without Contrast  MRI Foot Right Without Contrast   REASON FOR EXAM:    SWELLING, PAIN, DRAINAGE, POSSIBLE OSTEOMYELITIS  COMMENTS:       PROCEDURE: MR  - MR FOOT RIGHT WO CONTRAST  - Oct 03 2014  5:08PM     CLINICAL DATA:  Right hip pain and swelling. History of fourth toe  amputation.    EXAM:  MRI OF THE RIGHT FOREFOOT WITHOUT CONTRAST    TECHNIQUE:  Multiplanar, multisequence MR imaging was performed. No intravenous  contrast was administered.  COMPARISON:  None.    FINDINGS:  There is  evidence of prior fourth metatarsal head and fourth toe  amputation.    There is soft tissue edema surrounding the dorsal aspect of the  third MTP joint. There is bone destruction of the base of the third  proximal phalanx and third metatarsal head with marrow edema most  concerning for osteomyelitis. There are low signal foci in the  surrounding soft tissue concerning for air. There is a 1.7 x 1.6 cm  fluid collection along the dorsal aspect of the third  metatarsal  head concerning for abscess.    There is abnormal edema in the medial aspect of the flexor digitorum  brevis muscle which appears to extend from the third MTP joint with  a few low signal foci along its course concerning for infection and  air tracking along the flexor digitorum muscle.    There is serpiginous signal abnormality within the base and shaft of  the third metatarsal most concerning for a bone infarct.    There is no other marrow signal abnormality. There is no other  fracture or dislocation.     IMPRESSION:  1. Osteomyelitis of the third metatarsal head and the base of the  third proximal phalanx with surrounding cellulitis. There are low  signal foci within the soft tissues concerning for gas-forming  organism. There is a 1.7 x 1.6 cm fluid collection along the dorsal  aspect of the third metatarsal head concerning for an abscess.  Podiatry consultation is recommended.    2. There is abnormal edema in the medial aspect of the flexor  digitorum brevis muscle which appears to extend from the third MTP  joint with a few low signal foci along its course concerning for  infection and air tracking along the flexor digitorum muscle.  3. There is serpiginous signal abnormality in the base and shaft of  the third metatarsal most consistent with a bone infarct.  4. Prior amputation of the fourth metatarsal head and phalanx.  These results were called by telephone at the time of interpretation  on 10/03/2014 at  5:24 pm to Dr. Chinita Greenland , who  verbally acknowledged these results.    Electronically Signed    By: Kathreen Devoid    On: 10/03/2014 17:24         Verified By: Jennette Banker, M.D., MD    Impression 1. Atherosclerosis bilateral lower extremities with right foot chronic ulceration with worsening infection and surrounding cellulitis. There is a high suspicion for osteomyelitis in this patient. He has failed outpatient therapy with oral antibiotics. He is an inpatient. Blood cultures are pending.  He will need an angiogram and hopefully intervention for limb salvage.  I will plan for this coming Teusday. 2. Acute renal failure. The patient seems to have some underlying chronic kidney disease stage III. At this time, his creatinine has worsened secondary to the infection, but we will also check a CK level. We will bolus 2 liters of normal saline, put him on maintenance fluids, and repeat laboratories in the morning.  3. Anemia of chronic disease, stable.  4. Uncontrolled diabetes mellitus. The patient seems to be compliant with his insulin 70/30, but has poor outpatient follow-up with primary care physician. We will continue his 70/30 at his home dose and up-titrate as needed. The patient will be on a diabetic diet and sliding scale insulin. We will also check HbA1c.  5. Hypertension. Hold lisinopril and hydrochlorothiazide secondary to acute renal failure. We will use IV p.r.n. medications. Presently this seems to be fairly controlled.  6. Tobacco abuse. The patient has been counseled to quit smoking for greater than 3 minutes.   Plan level 4 consult   Electronic Signatures: Hortencia Pilar (MD)  (Signed 07-Feb-16 12:20)  Authored: General Aspect/Present Illness, Home Medications, Allergies, History and Physical Exam, Vital Signs, Labs, Radiology, Impression/Plan   Last Updated: 07-Feb-16 12:20 by Hortencia Pilar (MD)

## 2014-12-30 NOTE — Discharge Summary (Signed)
PATIENT NAME:  Scott [name], Scott [name] MR#:  [id number] DATE OF BIRTH:  [birth year]  DATE OF ADMISSION:  12/13/2014 DATE OF DISCHARGE:  12/13/2014  ADMITTING PHYSICIAN:  Lance Coon, MD  DISCHARGING PHYSICIAN:  Gladstone Lighter, MD   Eclectic:   Cardiology by  Juanda Bond, MD   PRIMARY CARE PHYSICIAN:  None.   DISCHARGE DIAGNOSES: 1. Acute respiratory failure.  2. Acute on chronic systolic congestive heart failure exacerbation. Known ejection fraction of 40%.  3. Coronary artery disease with unstable angina, known 3 vessel cardiac disease.  4. Malignant hypertension.  5. Diabetes mellitus.  6. Right leg cellulitis.  7. Neuropathy.  8. Chronic kidney disease.   DISCHARGE MEDICATIONS:  1. Omeprazole 20 mg p.o. daily.  2. Simvastatin 20 mg p.o. at bedtime.  3. Insulin 70/30 at 35 units subcutaneously twice a day.  4. Aspirin 81 mg p.o. daily.  5. Prozac 20 mg p.o. daily.  6. Gabapentin 100 mg p.o. t.i.d.  7. Imdur 30 mg p.o. daily.  8. Plavix 75 mg p.o. daily.  9. Coreg 25 mg p.o. b.i.d.  10. Lisinopril 30 mg p.o. daily.  11. Augmentin 875 mg 1 tablet p.o. b.i.d.  12. Nitroglycerin paste 1 inch twice a day.  13. Lasix 40 mg IV q. 12 hours.   DISCHARGE OXYGEN:  Two to 3 liters.   DISCHARGE DIET: Low sodium diet.   DISCHARGE ACTIVITY: As tolerated.   FOLLOWUP INSTRUCTIONS: The patient is being transferred to [facility] for further cardiac intervention.   LABORATORY AND IMAGING STUDIES PRIOR TO DISCHARGE:   1. ABG showing pH of 7.35, pCO2 of 41, pO2 of 112, bicarbonate of 22, and saturations of 97% on BiPAP with FiO2 of 30%.  2. WBC 19.1, hemoglobin 10.7, hematocrit 33.8, platelet count 510,000.  3. Sodium 140, potassium 4.6, chloride 111, bicarbonate 23, BUN 25, creatinine 1.95, glucose 213 and calcium of 8.0.  4. ALT 13, AST 19, alkaline phosphatase 149, total bilirubin 0.3 and albumin of 2.8.  5. Troponin is only 0.04.  6. BNP is elevated at  228.  7. The patient's cardiac catheter done on 12/03/2014 showing right dominant circulation 40% proximal LAD and 60% mid LAD stenosis. Seventy percent mid circumflex and 60% distal RCA and 70% PDA stenosis noted.   EF is calculated to be 40%.   BRIEF HOSPITAL COURSE:  1. Scott [name] is a  54 year old Caucasian male with past medical history significant for hypertension, diabetes mellitus, CAD, systolic CHF, EF of [id number], CKD. Was just admitted to the hospital from 12/01/2014 and discharged on 12/04/2014. He was diagnosed to have angina with CAD but with catheterization showing, moderate 3 vessel disease. The patient was discharged with medical management; however, comes back again with worsening chest pain, dyspnea, has pulmonary edema, malignant hypertension.  He was seen by cardiologist, Dr. Humphrey Rolls, here in the hospital who feels like the patient is  needing a high risk angioplasty procedure because of his 2 vessel disease, especially high grade PDA disease and circumflex lesion. He did not do good on medical management  10 days ago, so with his ongoing cardiac symptoms, he will need transfer to a tertiary care center with cardiothoracic surgery back-up.  Dr. Humphrey Rolls  spoke with Dr. Charolette Forward, cardiologist at [facility] and who has kindly accepted the patient and the patient is being transferred over there for further coronary intervention.  All his medications are being continued.  2. He was also started on Lasix for  his acute pulmonary edema and  he was on nitro drip for his malignant hypertension here in the ED and we were able to just wean him off the drip and started his p.o. medications.  3. Also, he is on BiPAP when he came in with his respiratory distress.   His breathing is improved. He is not hypoxic. He is changed over to nasal cannula at this time after diuresis.  4. Right foot cellulitis recently discharged on Augmentin, still finishing up the course. Erythema has improved, though it is  still swollen.   His course has been otherwise uneventful.   TOTAL CRITICAL CARE TIME SPENT ON THIS PATIENT:  Is 45 minutes.    ____________________________ Gladstone Lighter, MD rk:tr D: 12/13/2014 15:48:06 ET T: 12/13/2014 16:23:04 ET JOB#: [id number]  cc: Gladstone Lighter, MD, <Dictator> Gladstone Lighter MD ELECTRONICALLY SIGNED 12/21/2014 15:06

## 2014-12-30 NOTE — H&P (Signed)
PATIENT NAME:  Scott Scott [name], Scott Scott [name] MR#:  [id number] DATE OF BIRTH:  February 13, 1961  DATE OF ADMISSION:  12/13/2014  CHIEF COMPLAINT:  Shortness of breath.   HISTORY OF PRESENT ILLNESS:  This is Scott [name] 54 year old gentleman with Scott [name] known history of congestive heart failure, who was recently admitted here for NSTEMI and acute-on-chronic congestive heart failure discharged only about 10 days ago, who returns to the ED today with progressive shortness of breath with episodes of diaphoresis. He states that he was lying down when these episodes started and continued to progress. He tried some nebulizers and breathing and whatnot and it did not get any better, so he came in to the ED to be evaluated. He also states that he has right foot cellulitis, for which he has recently been put on Augmentin, although his cellulitis does not seem to be improving as far as he can tell. In the ED, the patient was found to have an elevated BNP and barely elevated troponin at 0.04 with an elevated white blood cell count. Chest x-ray was consistent with progressive congestive heart failure and so the hospitalists were called for admission. The patient states that nothing really made his symptoms better, but his symptoms were worse when he would lay down flat.   The patient had significantly elevated blood pressures en route to the hospital while with EMS and in the ED was put on Scott [name] nitroglycerin drip with good response of his blood pressure.   PRIMARY CARE PHYSICIAN:  Nonlocal.   PAST MEDICAL HISTORY:  Prior.   The symptoms started briefly after his discharge and have continued progressively since that time, but got acutely worse the night prior to admission.   PAST MEDICAL HISTORY:  Congestive heart failure, hypertension, diabetes mellitus.   CURRENT MEDICATIONS: Simvastatin 20 mg daily, Prozac 20 mg daily, Prilosec 20 mg daily, lisinopril 30 mg daily, isosorbide mononitrate extended-release 30 mg daily, insulin aspart 35 units b.i.d.,  gabapentin 100 mg t.i.d., Plavix 75 mg daily, Coreg 25 mg b.i.d., aspirin 81 mg daily, Augmentin 825/125 mg 1 tablet b.i.d.   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   ALLERGIES:  No known drug allergies.   FAMILY HISTORY:  Coronary artery disease, diabetes mellitus, cancer.   SOCIAL HISTORY:  The patient smokes 1/2 pack per day and has smoked for about 16 years, though he is trying to quit. He denies alcohol use and he denies illicit drug use.   REVIEW OF SYSTEMS: CONSTITUTIONAL:  Denies fever, fatigue, or weakness.  EYES:  Denies blurred or double vision, pain, or redness.  EARS, NOSE, AND THROAT:  Denies ear pain, hearing loss, or difficulty swallowing.  RESPIRATORY:  Endorses cough with wheeze and dyspnea. Denies painful respiration. Denies hemoptysis.  CARDIOVASCULAR:  Denies chest pain. Endorses orthopnea. Endorses edema. Denies palpitations or syncope. Endorses dyspnea on exertion.  GASTROINTESTINAL:  Denies nausea, vomiting, diarrhea, abdominal pain, or constipation.  GENITOURINARY:  Denies dysuria, hematuria, or frequency.  ENDOCRINE:  Denies polyuria, nocturia, or thyroid problems.  HEMATOLOGIC AND LYMPHATIC:  Denies anemia, easy bruising or bleeding, or swollen glands.  INTEGUMENTARY:  Denies acne, rash, or lesion.  MUSCULOSKELETAL:  Denies acute arthritis, joint swelling, or gout.  NEUROLOGICAL:  Denies numbness, weakness, or headache.  PSYCHIATRIC:  Denies anxiety, insomnia, or depression.   PHYSICAL EXAMINATION: VITAL SIGNS:  Blood pressure 149/95, pulse 78, temperature 98.1, respiratory rate 30 with 96% oxygen saturations on supplemental oxygen with BiPAP.  GENERAL:  This is Scott [name] well-nourished  gentleman sitting up in bed in respiratory distress with BiPAP in place.  HEENT:  Pupils are equal, round, and reactive to light and accommodation. Extraocular movements are intact. No scleral icterus. Moist mucosal membranes.  NECK:  Thyroid is not  enlarged. Neck is supple. No masses. Nontender. No cervical adenopathy. JVD is difficult to assess, but not appreciated.  RESPIRATORY:  Diffuse wheezes throughout both lung fields and bibasilar crackles with no rhonchi. He does have some increased effort with some mild respiratory distress on BiPAP.  CARDIOVASCULAR:  Regular rate and rhythm. No murmurs, rubs, or gallops. Good pedal pulses with 1+ lower extremity edema bilaterally.  ABDOMEN:  Soft, nontender, nondistended. Good bowel sounds.  MUSCULOSKELETAL:  Muscular strength is 5/5 in all 4 extremities with full spontaneous range of motion throughout. No cyanosis or clubbing.  SKIN:  No rash or lesion. Skin is warm, dry, and intact.  LYMPHATIC:  No adenopathy.  NEUROLOGICAL:  Cranial nerves are intact. Sensation is intact throughout. No dysarthria or aphasia.  PSYCHIATRIC:  Alert and oriented x 3, cooperative, not confused, not agitated.   LABORATORY DATA:  White count is 19.1, hemoglobin 10.7, hematocrit 33.8, and platelets are 510,000. Sodium is 140, potassium 4.6, chloride 111, bicarbonate 23, BUN 25, creatinine 1.95, glucose 213, and calcium is 8.0. BNP (B-type natriuretic peptide) is 228, total protein 7.2, albumin 2.8, bilirubin 0.3, alkaline phosphatase 149, AST 19, ALT 13. Troponin is 0.04.   Chest x-ray as per HPI shows progressive congestive heart failure.   ASSESSMENT AND PLAN: 1.  Acute respiratory failure. This is due to his acute-on-chronic congestive heart failure. He is on BiPAP. We will wean that as we can. See below for further treatment of primary cause of respiratory failure.  2.  Acute-on-chronic diastolic congestive heart failure. We will give him IV Lasix for diuresis and continue on BiPAP for now. The patient's BNP is elevated. He also had Scott [name] recent myocardial infarction, although his troponins are still downtrending from that time. We will get Scott [name] cardiology consult.  3.  Right foot cellulitis. The patient states it has not  improved much on Augmentin. We will give him some vancomycin here to make sure that we can treat this cellulitis.  4.  Hypertension. We will continue the patient's home medications. He has weaned almost off of the nitroglycerine drip. We will stop it and see how he does. We will use intravenous p.r.n. antihypertensives if we need to in addition to his home medications.  5.  Diabetes mellitus. He is n.p.o. for now with noninvasive ventilation, so we will hold his home insulin. We will give him sliding scale insulin every 6 while he is n.p.o. to be changed to q.i.d. Scott [name].c. and h.s. after he is eating again.  6.  Deep vein thrombosis prophylaxis. Subcutaneous heparin.   CODE STATUS:  This patient is full code.   TIME SPENT ON THIS ADMISSION:  45 minutes.    ____________________________ Wilford Corner. Jannifer Franklin, MD dfw:nb D: 12/13/2014 04:34:14 ET T: 12/13/2014 05:36:58 ET JOB#: [id number]  cc: Wilford Corner. Jannifer Franklin, MD, <Dictator> Kaitland Lewellyn Fawn Kirk MD ELECTRONICALLY SIGNED 12/13/2014 20:38

## 2014-12-30 NOTE — Consult Note (Signed)
PATIENT NAME:  Scott [name], Scott [name] MR#:  [id number] DATE OF BIRTH:  [birth year]  DATE OF CONSULTATION:  12/13/2014  REFERRING PHYSICIAN:   CONSULTING PHYSICIAN:  Dionisio David, MD  INDICATION OF THE CONSULTATION: Shortness of breath and coronary artery disease.   HISTORY OF PRESENT ILLNESS: This is a 54 year old white male with a past medical history of having recently NON-STEMI 2 weeks ago, who presented to the Emergency Room again with shortness of breath, orthopnea, and PND.  Chest x-ray shows congestive heart failure. I was asked to evaluate the patient.  PAST MEDICAL HISTORY: The patient had coronary artery disease diagnosed on cardiac catheterization.  Had a 70% left circumflex, 60% distal LAD and PDA was about 70% on cardiac catheterization with ejection fraction on echocardiogram 45%.  He had renal insufficiency. Thus, it was decided to treat him medically, but comes back now with shortness of breath.   PAST MEDICAL HISTORY: History of hypertension, hyperlipidemia, and diabetes.   SOCIAL HISTORY: He smokes about 1/2 pack per day. No ETOH abuse.   FAMILY HISTORY: Positive for coronary artery disease.   PHYSICAL EXAMINATION:  GENERAL: He is alert, oriented x 3, in no acute distress.  VITALS: Stable.  NECK: Positive JVD.  LUNGS: Good air entry. Some crackles at the bases.  HEART EXAMINATION: Regular rate and rhythm. Normal S1, S2. No audible murmur, aberrantly is tachycardic.   LABORATORY DATA:  EKG shows sinus tachycardia, no acute changes. His creatinine is 1.9.  Troponin is barely 0.04.   ASSESSMENT AND PLAN: The patient has 2 vessel disease with a high-grade posterior descending artery lesion and circumflex lesion.  It was decided initially to treat medically, but apparently the patient continues to have symptoms of congestive heart failure. Will transfer the patient to Dr. Zenia Resides service at [facility] and to high risk multivessel percutaneous coronary intervention.  Thank you  very much for the referral.   ___________________________ Dionisio David, MD sak:sp D: 12/13/2014 09:27:09 ET T: 12/13/2014 10:07:04 ET JOB#: [id number]  cc: Dionisio David, MD, <Dictator> Dionisio David MD ELECTRONICALLY SIGNED 12/14/2014 15:28

## 2014-12-30 NOTE — Op Note (Signed)
PATIENT NAME:  Scott [name], Scott [name] MR#:  [id number] DATE OF BIRTH:  [birth year]  DATE OF PROCEDURE:  10/11/2014  PREOPERATIVE DIAGNOSES: Open wounds secondary to osteomyelitis and deep soft tissue infections to the dorsal and plantar aspect of the right foot.   POSTOPERATIVE DIAGNOSIS: Open wounds secondary to osteomyelitis and deep soft tissue infections to the dorsal and plantar aspect of the right foot.   PROCEDURE: Debridement of necrotic, devitalized, infected soft tissue dorsally and plantarly, with delayed primary closure to the dorsum of the foot and partial delayed primary closure to the plantar aspect of the right foot.   SURGEON: Gerrit Heck. Nahzir Pohle, DPM   ASSISTANT: None.   HISTORY OF PRESENT ILLNESS: The patient has had severe infection over the last couple of weeks that started off here in the hospital last week, and I have incised and drained and removed some infected soft tissue and bone from the right foot on 2 separate occasions. Appears to be stabilizing with his infection at this point, and I felt like we could close up some of these areas. There was some necrotic tissue centrally in both wounds, however, that needs to be debrided out.   ANESTHESIA: General with local assist.   BLOOD LOSS: Less than 25 mL.   ANESTHESIOLOGIST: Gunnar Fusi, MD   ESTIMATED BLOOD LOSS: Negligible.   HEMOSTASIS: None.   OPERATIVE REPORT: The patient was brought to the OR and placed on the OR table in the supine position. At this point, after general anesthesia was achieved by the anesthesia team and local anesthesia was obtained by myself, the patient was then prepped and draped in the usual sterile manner. At this time, attention was directed to the dorsum of the right foot, where [name] Versajet set at 3 was utilized to remove devitalized, unhealthy tissue to the region. Some drainage in the region, so I took [name] culture of that to see if it looked like it was normal serous drainage. Following  extensive debridements and exploration, the dorsal wound was closed approximately 80% to 85% with primary simple interrupted sutures of 2-0 nylon. The proximal portion of the wound was packed open. At this time, attention was directed plantarly, where the Versajet was used to debride devitalized tissue, both on the skin, fatty tissue, plantar fascial ligament, and plantar tendons. Once all the necrotic and unhealthy tissue was removed from the area, the proximal portion of the wound was closed primarily with 2-0 nylon, but there was still [name] wound present plantarly, approximately 3 cm x 2 cm, that was packed open. The distal wound is still being packed. This had been present since his admission to the hospital. The areas were dressed with 4 x 4's, conFORM, ABD pads, and Kerlix at this point. The patient tolerated the procedure and anesthesia well and left the OR for the recovery room with vital signs stable and neurovascular status intact.    ____________________________ Gerrit Heck. Rolfe Hartsell, DPM mgt:mw D: 10/11/2014 13:41:41 ET T: 10/11/2014 18:28:10 ET JOB#: [id number]  cc: Gerrit Heck Krisanne Lich, DPM, <Dictator> Perry Mount MD ELECTRONICALLY SIGNED 11/15/2014 13:51

## 2014-12-30 NOTE — Consult Note (Signed)
PATIENT NAME:  Scott [name], Scott [name] MR#:  [id number] DATE OF BIRTH:  09-04-1960  DATE OF CONSULTATION:  12/02/2014  REFERRING PHYSICIAN:   CONSULTING PHYSICIAN:  Dionisio David, MD  INDICATION FOR CONSULTATION: Chest pain and elevated troponin.   HISTORY OF PRESENT ILLNESS: This is a 54 year old, white male, with a past medical history of diabetes, hypertension, hyperlipidemia, who presented to the Emergency Room with chest pain and severe shortness of breath. The patient states for the past couple of days, he has been very short of breath associated with paroxysmal nocturnal dyspnea, orthopnea, swelling of the legs. He also had chest pain, mostly on the right side, some in the retrosternal area, described as pressure-type associated with diaphoresis and lightheadedness.   PAST MEDICAL HISTORY: As mentioned, he has history of hypertension, non-insulin-dependent diabetes, and hyperlipidemia.   SOCIAL HISTORY: He smokes about 1/2 pack per day. No history of EtOH abuse.   FAMILY HISTORY: His brother had coronary artery disease and apparently had a PCI.   PHYSICAL EXAMINATION:  GENERAL: He is alert and oriented x 3, in no acute distress right now.  VITAL SIGNS: Blood pressure is 168/95, respirations 20, pulse 86, saturation 96.  NECK: No JVD.  LUNGS: Good air entry. No rales or rhonchi.   HEART: Regular rate and rhythm. Normal S1, S2. No audible murmur.  ABDOMEN: Soft, nondistended, positive bowel sounds.  EXTREMITIES: Has 1+ pedal edema.  NEUROLOGIC: Alert and oriented, no acute deficit.   LABORATORY DATA: EKG shows normal sinus rhythm, 99 beats per minute, nonspecific ST-T changes, questionable old inferior wall MI.   His BUN is 25, creatinine is 1.7, potassium 5.6.   Initial troponin was 0.05. Follow-up troponin was 1.53, and then and third set was 4.31.   His white count is 11.2, hemoglobin is 9.9, and platelet count is 395,000.   ASSESSMENT AND PLAN: The patient has chest pain  associated with elevated troponin, most likely qualifies for a non-ST-elevation myocardial infarction. The patient is feeling better, but there no acute EKG changes. Advise echocardiogram to look at the wall motion and ejection fraction, as well as any valvular abnormalities. Also advise setting him up for cardiac catheterization tomorrow. In the meantime, advised continuing heparin, aspirin and hold Lasix as his creatinine is going up.   Thank you very much for the referral.    ____________________________ Dionisio David, MD sak:JT D: 12/02/2014 10:34:25 ET T: 12/02/2014 10:55:02 ET JOB#: [id number]  cc: Dionisio David, MD, <Dictator> Dionisio David MD ELECTRONICALLY SIGNED 12/11/2014 13:33

## 2014-12-30 NOTE — H&P (Signed)
PATIENT NAME:  Scott [name], Scott [name] A MR#:  [id number] DATE OF BIRTH:  Jan 18, 1961  DATE OF ADMISSION:  12/01/2014  PRIMARY CARE PHYSICIAN: [facility].  CHIEF COMPLAINT: Chest pain and shortness of breath.   HISTORY OF PRESENTING ILLNESS: This is a 54 year old male who has a history of insulin-dependent diabetes, hypertension, gastroesophageal reflux disease, chronic right foot wound and surgery for that, and smoking. He was admitted to hospital for his infection on the foot and clean-up surgeries were done.  Has also amputation in the past and follows with Dr. Elvina Mattes, podiatry, for that. Says that for the last few months he has complained of on and off getting short of breath every time with minimal exertion of walking almost 40 to 50 steps.  On example, he states that walking from his porch to the back yard he gets short of breath, or from parking lot after coming out of the car and going to the store. He gets short of breath. He also feels somewhat tightness in the chest with that and he has to take some rest for a few minutes before he can continue working or walking again. This has been going on chronically for the last few months, but did not seek any medical attention for that.  A few days ago, he was coming off his truck and accidentally fell down and hit his right side of the chest on the ground and since then he has some pain on the right side of the chest.  Last night before presentation, he suddenly got more short of breath. He could not lie down flat in the bed and he had to sit up. He also noted some more swelling on his right side of the leg. He denies any cough or fever with that and because of that he decided to come to the Emergency Room. In the ER, he was noted to have slight congestion on his chest x-ray and mild elevation of the troponin, so by ER physician started on heparin drip and given to hospitalist team for further management.  REVIEW OF SYSTEMS: CONSTITUTIONAL: Negative for  fever, fatigue, weakness. Has pain in the right side of the chest. No weight loss or weight gain.  EYES: No blurring of vision, discharge, or redness.  EARS, NOSE, THROAT: No tinnitus, ear pain, or hearing loss.  RESPIRATORY: No cough or wheezing, but has mild shortness of breath.  CARDIOVASCULAR: The patient has chest pain on the right side, but has complained of orthopnea since last night. No palpitation.  GASTROINTESTINAL: No nausea, vomiting, diarrhea, abdominal pain.  GENITOURINARY: No dysuria, hematuria, increased frequency.  ENDOCRINE: No heat or cold intolerance. No excessive sweating.  SKIN: No acne, rashes, or lesions. 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  He just goes to podiatry for followup and gets regular dressing change at home.  NEUROLOGICAL: No numbness, weakness, tremor, or vertigo.  PSYCHIATRIC: No anxiety, insomnia, bipolar disorder.  JOINTS:  No swelling or tenderness.   PAST MEDICAL HISTORY:  1.  Insulin-dependent diabetes.  2.  Hypertension.  3.  Gastroesophageal reflux disease.  4.  Chronic right foot wound.  5.  Smoking.   PAST SURGICAL HISTORY: Amputation of his fourth toe on the right foot.   ALLERGIES: No known drug allergies.   SOCIAL HISTORY: He smokes 1/2 pack of cigarettes now.  He was a smoker of 2 packs of cigarettes every day in the past. Does not drink alcohol. No illicit drug use. Does not  require any support to walk.  Lives at home with family.   FAMILY HISTORY: Positive for diabetes in his siblings.  MEDICATIONS: 1.  Simvastatin 20 mg oral once a day.  2.  Prozac 20 mg capsule once a day.  3.  Omeprazole 20 mg orally once a day.  4.  Lisinopril 10 mg once a day.  5.  Insulin Aspart 35 units 70/30 insulin 2 times a day.  6.  Gabapentin 100 mg 3 times a day.  7.  Aspirin 81 mg once a day.  8.  Amlodipine 10 mg take 1/2 tablet once a day.   PHYSICAL EXAMINATION:  VITAL SIGNS IN Emergency Room:  Temperature 97.8, pulse 97,  respirations 18, blood pressure 191/108, and pulse oximetry is 92 on room air and came to 98% with oxygen supplementation.  GENERAL: The patient is alert and oriented, disheveled.  HEENT: Head and neck atraumatic. Conjunctivae pink. Oral mucosa moist. Sclerae anicteric.  NECK: Supple. No JVD. Thyroid nontender.  RESPIRATORY: Bilateral equal and clear air entry, mild crepitation in lower lung fields. No use of accessory muscles. No wheezing.  CARDIOVASCULAR: S1, S2 present, regular. No murmur. No tenderness on local palpation on the left side of the chest.  ABDOMEN: Soft, nontender, bowel sounds present. No organomegaly.  SKIN: Right foot chronic wound and dressing present. No changes suggesting of infection. No redness.  LEGS:  Mild edema on the right leg. Left leg without any edema.  JOINTS:  No swelling or tenderness.  NEUROLOGIC: Power is 5/5 in all 4 limbs. Follows commands. No tremor or rigidity. Sensation is intact. Reflexes preserved.  PSYCHIATRIC: Does not appear in acute psychiatric illness at this time.   IMPORTANT LABORATORY RESULTS:   1.  Glucose 359, BNP is 130, BUN 25, creatinine 1.70, sodium 139, potassium 5.6, chloride 111, CO2 of 21, potassium is 7.8.  2.  Troponin 0.05.  3.  WBC 15.1, hemoglobin 10.4, platelet count is 396,000, INR 0.9.  4.  Blood culture does not grow any organisms.  5.  Chest x-ray PA and lateral is done, showed mild interstitial edema consistent with CHF and ultrasound color Doppler of lower extremity shows no evidence of deep venous thrombosis.   ASSESSMENT AND PLAN: A 54 year old male who came to hospital with complaint of sudden onset shortness of breath and orthopnea. Also has complained of progressive dyspnea on exertion with minimal activities for the last few months.  1.  Acute congestive heart failure exacerbation. His BNP is negative, but the patient is obese. Most likely it is falsely low as the patient has typical symptoms for that and the  findings suggestive of congestive heart failure.  Would like to start him on IV Lasix and treat for congestive heart failure. There is no previous echocardiogram to determine his status of systolic versus diastolic congestive heart failure, so will get an echocardiogram for further details  2.  Elevated troponin. Troponin is 0.05.  Started on heparin IV drip by ER physician. Will follow further troponins and monitor on telemetry.  If it comes out negative, then would discontinue heparin drip. He is already on aspirin. Meanwhile, will continue that and will check lipid panel.  3.  Chronic renal failure. His creatinine appears to be at his baseline as per the previous records we have in the system.  Will continue monitoring in the hospital.  4.  Mild hyperkalemia. The patient is asymptomatic and he will be on IV Lasix for his congestive heart failure so will just  follow tomorrow.  5.  Elevated WBC count.  This might be is secondary to his chronic ulcer on the foot. There are no signs of infection until now. Will just continue monitoring.  6.  Active smoking. Smoking cessation counseling is done for 4 minutes and offered him nicotine patch.  He would be fine without that and he would strongly consider about quitting his smoking habit from now onward.  7.  Hypertension. Blood pressure is out of control on presentation to ER.  He is given hydralazine injection. Will give him lisinopril and amlodipine, his home medication, and that might help.  He will also be on Lasix IV to help with blood pressure.  8.  Diabetes. Will continue his 70/30 insulin and put him on sliding scale coverage. 9.  Code Status:  Full.   Total time spent on this admission is 50 minutes.    ____________________________ Ceasar Lund Anselm Jungling, MD vgv:sp D: 12/02/2014 10:48:00 ET T: 12/02/2014 11:13:26 ET JOB#: [id number]  cc: Ceasar Lund. Anselm Jungling, MD, <Dictator> [facility] Gerrit Heck. Troxler, DPM  Vaughan Basta  MD ELECTRONICALLY SIGNED 12/06/2014 0:39

## 2014-12-30 NOTE — Discharge Summary (Signed)
PATIENT NAME:  Scott [name], Scott [name] MR#:  [id number] DATE OF BIRTH:  Feb 13, 1961  DATE OF ADMISSION:  10/03/2014 DATE OF DISCHARGE:  10/12/2014  DISCHARGE DIAGNOSES: 1.  Osteomyelitis. 2.  Peripheral vascular disease. 3.  Chronic renal failure.  4.  Hypertension.  5.  Diabetes.   CONDITION ON DISCHARGE: Stable.   DISCHARGE MEDICATIONS: 1.  Lisinopril 10 mg oral tablet once [name] day. 2.  Omeprazole 20 mg delayed-release once [name] day. 3.  Simvastatin 20 mg once [name] day.  4.  Insulin 35 units subcutaneous 2 times [name] day.  5.  Metronidazole 500 mg oral tablet every 8 hours for 13 days.  6.  Acetaminophen/oxycodone 5 mg tablets one up to 4 times [name] day for 7 days as needed for moderate pain.  7.  Aspirin 81 mg once [name] day.  8.  Clopidogrel 75 mg oral tablet once [name] day.  9.  Cilostazol 50 mg oral tablet 2 times [name] day.  10.  Amlodipine 10 mg oral tablet take 1/2 tablet once [name] day.  11.  Ceftriaxone 2 grams every 24 hour for 13 days.  12.  Docusate sodium 100 mg oral capsule 2 times [name] day.  DISCHARGE INSTRUCTIONS: Advised to have visiting nurse and the social worker arrange for that. Strict non-weight-bearing exercise on the right foot and dressing change daily. Advised to have low sodium, carb-controlled, ADA diet and regular consistency and follow up with Dr. Elvina Mattes within 1 to 2 weeks and 2 to 4 weeks in nephrology clinic.  HISTORY OF PRESENTING ILLNESS: [name] 54 year old male with history of hypertension, diabetes, tobacco abuse, had fourth toe amputated 2 years ago, seen at wound clinic with poor healing of his wound on the foot. There was no response to antibiotic, doxycycline, as outpatient, so he was sent to Emergency Room. White cell count 19,000. Had acute renal failure and elevated lactic acid, and he was admitted for further management of these issues.   HOSPITAL COURSE AND STAY: In the hospital, the patient was treated by Dr. Elvina Mattes from podiatry. MRI was showing osteomyelitis. Dr. Elvina Mattes did  amputation of third metatarsal and third base of proximal phalanx. He also had fluctuating fluid collections and he had repeated I and D bone by podiatry team during his hospital course of 10 days. He was maintained on vancomycin and Zosyn in hospital. Wound culture was growing gram-positive cocci, MSSA. The patient was seen by infectious disease specialist, Dr. Ola Spurr so PICC line was placed and he suggested to treat the patient with Rocephin and Flagyl as outpatient. The patient also had poor circulation in the same leg and he was seen by vascular surgeon in the office [name] few months ago, so we called [name] consult for vascular for the patient. They also placed [name] stent which gave good revascularization. The patient was discharged with physical therapy and [name] visiting nurse at home.   Other issues:  1.  Acute on chronic renal failure. He had some worsening in his BUN and creatinine. We held hydrochlorothiazide and ACE inhibitor and creatinine appeared to be at baseline, so we advised to continue following with nephrology clinic.  2.  Diabetes. Blood sugar was stable. We stopped his oral agents in hospital because of worsening renal function.  3.  Hyperlipidemia. Continued simvastatin.  4.  Peripheral vascular disease. Stent placed by vascular.  5.  Hypertension. Remained under control.  [place]:  1.  Dr. Elvina Mattes for podiatry. 2.  Dr. Ola Spurr for infectious disease.  3.  Dr. [name] for vascular.  IMPORTANT DIAGNOSTIC DATA:  Wound culture was growing Staphylococcus aureus, which was sensitive to all the routine antibiotics.  At the time of discharge, creatinine was stable at around 2.   TOTAL TIME SPENT ON THIS DISCHARGE: 40 minutes.   ____________________________ Ceasar Lund Anselm Jungling, MD vgv:sb D: 10/15/2014 08:46:59 ET T: 10/15/2014 10:37:09 ET JOB#: [id number]  cc: Ceasar Lund. Anselm Jungling, MD, <Dictator> Murlean Iba, MD Gerrit Heck Troxler, DPM Vaughan Basta  MD ELECTRONICALLY SIGNED 10/17/2014 16:11

## 2014-12-30 NOTE — Op Note (Signed)
PATIENT NAME:  Scott [name], Scott [name] A MR#:  [id number] DATE OF BIRTH:  04-07-1961  DATE OF PROCEDURE:  10/05/2014  PREOPERATIVE DIAGNOSES:  1.  Cellulitis and abscess dorsal and plantar aspects of the right foot.  2.  Osteomyelitis third metatarsal head and base of the proximal phalanx, right foot.   POSTOPERATIVE DIAGNOSES:  1.  Cellulitis and abscess dorsal and plantar aspects of the right foot.  2.  Osteomyelitis third metatarsal head and base of the proximal phalanx, right foot.   PROCEDURES: Incision and drainage of deep abscess to the dorsum of the foot and the plantar surface of the foot with excision of infected soft tissues, specifically plantar fascial ligament and extensor digitorum brevis tendon and muscle belly along with 1 strip of the extensor digitorum longus tendon.   SURGEON: Gerrit Heck. Chandler Swiderski, DPM  ASSISTANT: None.   HISTORY OF PRESENT ILLNESS: The patient is diabetic and has had peripheral neuropathy for a number of years. Had a history of osteomyelitis with amputation of the fourth ray to the right foot 2 years ago. Had recently been under the care of the [facility], was given some oral antibiotics but upon return to that clinic had worsening infection. The patient was sent to the Emergency Room 2 days ago. The patient has a history also of vascular angioplasty and stenting to the right lower extremity. This was approximately 2 years ago by Dr. [name].  ANESTHESIA: Dr. Marcello Moores - general with local block.   ESTIMATED BLOOD LOSS: Approximately 30 to 40 mL.   HEMOSTASIS: None.   OPERATIVE REPORT: The patient was brought to the OR and placed on the OR table in the supine position. At this point, after general anesthesia was achieved by the anesthesia team, a local ankle block was achieved by myself with 0.5% Marcaine plain, 10 mL. The patient was then prepped and draped in the usual sterile manner. At this time, attention was directed to the dorsum of the right foot where  an ulcer with draining pus was identified over the dorsal lateral aspect of the third metatarsophalangeal joint. Cellulitis was extending proximally about 10 cm and an incision was made dorsally along the shaft of the third and fourth metatarsal areas. This was deepened down to the third metatarsal capsular and periosteal tissue. Tissues looked fairly clean along this margin even though there was soft tissue cellulitis. There was no purulence or drainage coming from the region. This incision dorsally was approximately 5 cm long. Over the area of the metatarsal phalangeal joint there was heavier purulence and tissue damage. This soft tissue was resected and removed. The head of the metatarsal and the base of the proximal phalanx were identified. These were areas that were highly suspicious of osteomyelitis. The base of the proximal phalanx was then resected with power equipment as was the metatarsal head. The metatarsal head was also extremely prominent and caused quite a bit of pressure associated with the development of the ulceration. The area was then debrided further with a VersaJet. All  damaged soft tissue was removed until no further purulence or necrotic tissue was noted.   At this time, attention was directed to the plantar wound distally and also the plantar wound on the central plantar aspect of the foot. A sinus tract was palpated and seemed to run likely just deep to the plantar fascial ligament connecting these 2 areas of tissue breakdown. A long Kelly hemostat was used to run from one distal ulcer to the proximal ulcer. Skin  incision was made down to the plantar fascial ligament at this point. Purulence was noted to exude from the proximal portion of this wound. At this point, the plantar fascial ligament was identified and Mayo scissors were used to release the plantar fascial ligament away from the extensor digitorum brevis muscle belly and tendons. At this point, noted purulence was identified  and drained from the area. The area was then debrided, both wound sites as well as the plantar fascial ligaments, with the VersaJet set on 3. Tissues including the extensor digitorum brevis muscle belly, portions of this along with a couple of strands and strips of the tendon were identified and seen to be necrotic. These were removed with sharp dissection. One long flexor tendon was identified and removed due to infection and necrosis. Once this was accomplished, the area was copiously irrigated with pulsed lavage system. Damaged necrotic tissue was debrided with VersaJet set on 3 as mentioned earlier. Once all damaged soft tissue was removed and no more purulence was noted with compression of the foot, the area was then copiously irrigated once again with the pulse lavage system. Dorsally, the wound was closed with 3-0 nylon simple interrupted and horizontal mattress combination. The wound dorsally was closed in its entirety. Plantarly, the wound was closed centrally and proximally. A wound was left open for packing at the original ulcer site, submetatarsal 3, metatarsal head area, and the central necrotic area that had been debrided out on the midfoot. Iodoform gauze packing was placed deep in this area. This was all placed deep to the plantar fascial ligament and extensive amounts were utilized to pack and fill the area. At this point, the majority of the plantar incision was closed with 3-0 nylon simple interrupted sutures leaving the 2 ulcerative areas still open for packing and ability to remove and change the packing. At this time, after clean up, the patient had a sterile dressing placed on the foot consisting of 4 x 4's, ABD pads, Conform, Kling and Kerlix. The patient tolerated the procedure and anesthesia well and left the OR for the recovery room with vital signs stable and neurovascular status intact.   ____________________________ Gerrit Heck. Arleth Mccullar, DPM mgt:sb D: 10/05/2014 08:56:01  ET T: 10/05/2014 11:10:21 ET JOB#: [id number]  cc: Gerrit Heck Ikhlas Albo, DPM, <Dictator> Perry Mount MD ELECTRONICALLY SIGNED 10/08/2014 12:45

## 2014-12-30 NOTE — Consult Note (Signed)
PATIENT NAME:  Scott [name], TEICHER MR#:  [id number] DATE OF BIRTH:  11-30-1960  DATE OF CONSULTATION:  10/05/2014  REFERRING PHYSICIAN:  Abel Presto, MD CONSULTING PHYSICIAN:  Cheral Marker. Ola Spurr, MD  REASON FOR CONSULTATION: Osteomyelitis, diabetic foot infection.   HISTORY OF PRESENT ILLNESS: This is a pleasant 54 year old gentleman with poorly controlled diabetes and a several year history of right foot ulceration and infection. He has had surgery in the past for infection with amputation of his third toe by [name] in November  of 2014. He reports that since that time the wound was healing relatively well. He has also had evaluation by vascular. However, about 2 weeks ago, he started to have increasing pain, redness and swelling in the foot. He went to the wound care center and was transferred to the hospital and admitted February 3rd. He was having chills at that time. There was also some purulent drainage. Of note, he apparently had been on doxycycline from the wound care center prior to that. On admission, he was found to have a white count of 19,000 as well as acute renal failure.   PAST MEDICAL HISTORY: 1.  Insulin-dependent diabetes with chronic foot infection.  2.  Hypertension.  3.  GERD.  4.  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  5.  Ongoing tobacco abuse.   PAST SURGICAL HISTORY: Amputation of the fourth toe of his right foot.   ALLERGIES: He has no known drug allergies.   SOCIAL HISTORY: Lives with his girlfriend of 28 years. He does not drink alcohol. He does smoke 1 to 2 packs a week, but is very interested in quitting. No IV drug use.   FAMILY HISTORY: Noncontributory.   ANTIBIOTICS SINCE ADMISSION: Include Zosyn begun February 1st, vancomycin begun February 2nd.  PHYSICAL EXAMINATION: VITAL SIGNS: Temperature 98.1, pulse 74, blood pressure 146/92, respirations 18, sat 94%. Of note, he has not had a fever since admission.  GENERAL: He is pleasant,  interactive, obese, lying in bed.  HEENT: Pupils reactive. Extraocular movements are intact. Sclerae anicteric. Oropharynx clear.  NECK: Supple.  HEART: Regular.  LUNGS: Clear.  ABDOMEN: Soft, nontender, nondistended.  EXTREMITIES: His right leg is wrapped postoperatively from surgery just today.  NEUROLOGIC: He is alert and oriented x3. Grossly nonfocal neuro exam.   DIAGNOSTIC DATA: Cultures from the wound done January 29th grew moderate MSSA as well as light growth of an anaerobic gram-positive rod.   Blood cultures February 3rd: No growth x2.   Wound culture February 4th: Many gram-positive cocci in pairs and in clusters of the right foot and the base of the toe there were a few gram-positive cocci and few gram variable rods.   Prior culture from August 01, 2014, from the right lower extremity, grew heavy growth of a gram-negative rod, unable to be identified, as well as heavy growth myoides Myroides species as well as aerobic gram-positive rods.   White blood count on admission was 19.8, currently is 21.2. Hemoglobin 9.6, platelets 474,000. A1c is elevated at 10. Renal function initially was 2.3, currently is 2.5.   Imaging: MRI of the right foot showed osteomyelitis of the third metatarsal head and the base of the third proximal phalanx with surrounding cellulitis. There is a fluid collection dorsally as well. There is abnormal edema in the medial aspect of the flexor digitorum brevis muscle with air tracking along the muscle. There is also evidence of a bone infarct in the third metatarsal. There is prior amputation of  the fourth metatarsal head and phalanx.   Chest x-ray, February 3rd: No acute cardiopulmonary disease.   IMPRESSION: A 54 year old gentleman with poorly controlled diabetes, A1c of 10, admitted with nonhealing right foot infection that he has been dealing with for over 2 years. He had prior amputation of the fourth toe and fourth metatarsal head in November of 2014. He  has worsened over the last several weeks and was admitted. Culture from the wound is growing methicillin sensitive Staphylococcus aureus as well as aerobic gram-positive rod. Further cultures are also pending. He is now status post amputation of the third toe and removal of the third metatarsal.   RECOMMENDATIONS: 1.  Check sed rate and CRP.  2.  I suspect he is going to need at least 2 weeks of IV antibiotics. I will place an order for a PICC line.  3.  Continue current antibiotics, however, may be able to transition to just Zosyn if no methicillin-resistant Staphylococcus aureus is noted.  4.  He will need 2 weeks of IV antibiotics as well as weekly CBCs, C-MET and CRP, and a Vanco trough if he is on vancomycin.  5.  I can see him in clinic in follow-up.   Thank you for the consultation.   ____________________________ Cheral Marker. Ola Spurr, MD dpf:sb D: 10/05/2014 15:05:39 ET T: 10/05/2014 15:44:57 ET JOB#: [id number]  cc: Cheral Marker. Ola Spurr, MD, <Dictator> Yeshaya Vath Ola Spurr MD ELECTRONICALLY SIGNED 10/10/2014 20:53

## 2014-12-30 NOTE — Consult Note (Signed)
PATIENT NAME:  Scott [name], Scott [name] A MR#:  [id number] DATE OF BIRTH:  [birth year]  DATE OF CONSULTATION:  10/04/2014  REFERRING PHYSICIAN:   CONSULTING PHYSICIAN:  Gerrit Heck. Arriyana Rodell, DPM  REASON FOR CONSULTATION: Cellulitis, abscess, possible osteomyelitis right foot.   PRIMARY CARE PROVIDER: [facility].    WOUND CARE PHYSICIAN:  Dr. Quay Burow from the wound care center.   HISTORY OF PRESENT ILLNESS: The patient is a 54 year old male Caucasian, presented to the hospital with redness, cellulitis, and drainage to his right foot. He had a fourth toe amputation approximately 2 years ago by [name] and since then he has been followed periodically by the wound care center and was referred over here yesterday by them because of cellulitis and infection. Three weeks ago he noted some redness and purulent discharge and the wound care center put him on some doxycycline at that time timeframe. The patient noted increased worsening pain, redness, swelling, and came back to the wound care center yesterday when they referred him to the Emergency Room. He was admitted to the Emergency Room with a white count of 19,000, the white count is now 21,000.  The patient was started on Zosyn and vancomycin. No cultures have been done as of yet.   PAST MEDICAL HISTORY: Includes insulin-dependent diabetes, hypertension, GERD, chronic right foot wound, tobacco abuse 2 packs per day.    PAST SURGICAL HISTORY:  Is mostly just that fourth toe amputation of his right foot.   ALLERGIES: NKDA.   HOME MEDICATIONS: Include insulin 70/30, 40 units twice a day, lisinopril 10 mg daily, HCTZ 25 mg daily, fluoxetine 20 mg daily, doxycycline 100 mg b.i.d. orally, he also takes acetaminophen with hydrocodone 1 tablet every 6 hours as needed, also on metformin 500 mg b.i.d.   SOCIAL HISTORY: Positive for 1-2 packs per week. Denies EtOH. Denies illicit drug use. He lives at home with girlfriend.   PHYSICAL EXAMINATION:   VITAL  SIGNS:  At this time frame include temperature is 98.2, pulse 85, respirations 18, blood pressure is 152/93, pulse oximetry 96.  EXTREMITIES:  Lower extremity exam, vascular DP pulses are difficult to palpate, but the patient states he is followed by Dr. [name] who has done apparently an angioplasty and stenting on his right lower extremity. He was followed a couple of weeks ago according to the patient and was okay at that timeframe.  DERMATOLOGIC: The patient has drainage from a wound just to the dorsal proximal area at the base of the third toe laterally, heavy purulent drainage was noted in this region, probing on this area proceeds all the way from the dorsal aspect to the plantar aspect of the foot. There is no opening plantarly. Heavy scar tissue is noted where the fourth toe was amputated. Also present is a bullous blood blister on the plantar aspect of the right foot at the central portion of the foot. There is redness and cellulitis extending proximally and medially from this region. Also significant redness and cellulitis dorsally on the right foot.   X-rays recently in the last couple of days show a partial resected  fourth metatarsal and a complete amputation of the fourth toe. Radiographically x-rays from earlier last month show no evidence of any bony change or osteomyelitis, but MRI done yesterday or today 1 shows significant marrow edema in the third metatarsal which extends pretty much the entire distance and length of the bone.  It shows fluid buildup on the dorsal aspect of the right foot  and also likely flexor tendon infection in the sheath likely coming from the flexor tendon along the plantar aspect of the third metatarsal.   CLINICAL IMPRESSION: Significant cellulitis, abscess to the right foot involving 2 different areas, the dorsal of the foot and also the plantar aspect of the midfoot. Likely osteomyelitis as noted in the third metatarsal based on the MRI.   TREATMENT PLAN: Today  at bedside I opened up the wound so that it could drain better.  I took cultures from both the dorsum of the foot distally and the plantar proximal midfoot area and we will get these sent off to the laboratory for evaluation. Based on the MRI he likely has osteomyelitis, uncertain as to how extensive the soft tissue infection, but it looks fairly involved at this point. I plan to go ahead and take him to the OR tomorrow morning, he has already eaten something this afternoon. I will take him to the OR tomorrow morning at 7:30 for definitive debridement and clean out of these infected areas. I explained to the patient that I will likely need to do a third ray resection including amputation of his third toe and likely removal of the third metatarsal in its entirety unless clinically it does not look too significantly damaged. Based on the MRI however the entire segment has marrow edema in the region. We will also need to try to follow any sinus tracts and alleviate the purulence of infection in these regions. I will go ahead and get a consent form signed by him. I have scheduled for the operating room tomorrow morning at 7:30.     ____________________________ Gerrit Heck. Sherran Margolis, DPM mgt:bu D: 10/04/2014 14:35:13 ET T: 10/04/2014 15:10:43 ET JOB#: [id number]  cc: Gerrit Heck Zaidy Absher, DPM, <Dictator> Perry Mount MD ELECTRONICALLY SIGNED 10/08/2014 12:45

## 2014-12-30 NOTE — Op Note (Signed)
PATIENT NAME:  Scott [name], Scott [name] MR#:  [id number] DATE OF BIRTH:  1961-06-15  DATE OF PROCEDURE:  10/07/2014  PREOPERATIVE DIAGNOSIS: Abscess cellulitis, deep infection, right foot.   POSTOPERATIVE DIAGNOSIS: Abscess cellulitis, deep infection, right foot.   PROCEDURE: Incision and drainage of right foot infected cellulitic abscess, deep and complicated.   SURGEON: Scott [name],. Scott [name], DPM.   ASSISTANT: None.   HISTORY OF PRESENT ILLNESS: The patient has had an infected right foot for several weeks. I did an original procedure on it 2 days ago and incised and drained the plantar aspect of his foot below the plantar fascial ligament as well as resecting osteomyelitic bone from the third metatarsal head and the base of the third toe. Incision was also made dorsally to expose any pus on the dorsal aspect of the foot and [name] communication was noted from the base of the third toe down to the plantar aspect. On evaluation this morning, [name] fluctuant area and erythematous area was noted on the dorsal lateral aspect of the foot over the extensor digitorum brevis muscle belly region. I felt like this was an area of remaining past that needed to be incised and drained.   ANESTHESIA: General.   ANESTHESIOLOGIST: Dr. [place] [facility].   ESTIMATED BLOOD LOSS: 20 mL.   DESCRIPTION OF PROCEDURE: The patient was brought to the OR and placed on the OR table in the supine position. At this point after general anesthesia was achieved by the anesthesia team and [name] local ankle block was achieved by myself, the patient was then prepped and draped in the usual sterile manner. At this time, the area over the fluctuant erythematous area was marked and then [name] dorsal incision was made over this region with [name] 15 blade. The incision was carried deeper with blunt dissection and noted purulence was seen to drain from the region. [name] connection was noted proximally to the original incision margin, extending from the third toe area.  Sutures were removed from this region and the area was explored and purulence was identified and drained.   At this point, following copious irrigation and debridement with [name] VersaJet, another area of purulent drainage was extending from the plantar wound. This was explored and drained and copiously irrigated with saline. [name] connecting tract was noted in between the metatarsal proximal shaft down to the plantar wound. This was explored with [name] hemostat. After copious pulsatile irrigation and also debridement with VersaJet dorsally and plantarly, the wound was then partially closed dorsally to the distal area where the cellulitis had mostly cleared up. The cellulitic area on the dorsal lateral aspect of the foot was packed open and [name] packing was introduced through the connecting sinus tract. The plantar wound had been opened some proximally as well. This was packed open and then also the distal ulcer plantarly was packed open.   The patient is scheduled to have an angioplasty done tomorrow or Tuesday, which I think will help with his overall blood flow and healing. We will continue to follow him. He currently is on Zosyn. Cultures so far have shown likely anaerobic activity and growth. The patient left the OR for the recovery room with vital signs stable and neurovascular status intact.      ____________________________ Scott [name]. Scott [name], DPM mgt:TT D: 10/07/2014 17:50:49 ET T: 10/07/2014 18:08:03 ET JOB#: [id number]  cc: Scott [name] Scott [name], DPM, <Dictator> Scott Mount MD ELECTRONICALLY SIGNED 10/08/2014 12:45

## 2014-12-30 NOTE — Op Note (Signed)
PATIENT NAME:  Scott Scott [name], Scott Scott [name] MR#:  [id number] DATE OF BIRTH:  1961/04/02  DATE OF PROCEDURE:  10/09/2014  PREOPERATIVE DIAGNOSIS: Atherosclerotic occlusive disease, bilateral lower extremities, with ulceration of the right lower extremity.   POSTOPERATIVE DIAGNOSIS: Atherosclerotic occlusive disease, bilateral lower extremities, with ulceration of the right lower extremity.   PROCEDURES PERFORMED: 1. Abdominal aortogram.  2. Right lower extremity distal runoff, third order catheter placement.  3. Percutaneous transluminal angioplasty of the right posterior tibial artery to 3 mm.  4. Percutaneous transluminal angioplasty of the right SFA to 6 mm.   SURGEON: Katha Cabal, MD   SEDATION: Versed plus fentanyl.   CONTRAST USED: Isovue 80 mL.   FLUOROSCOPY TIME: 6.9 minutes.   INDICATIONS: Scott Scott [name] is Scott [name] 54 year old gentleman who presented to Dr. Elvina Mattes with worsening of his right foot wound. He subsequently has undergone surgery for extensive debridement and drainage of purulent material. He has nonpalpable pulses and Scott [name] history of atherosclerotic occlusive disease; and therefore, he is undergoing angiography with the hope for intervention for limb salvage. Risks and benefits are reviewed. All questions answered. The patient agrees to proceed.   DESCRIPTION OF PROCEDURE: The patient is taken to special procedures and placed in the supine position. After adequate sedation is achieved, both groins are prepped and draped in sterile fashion. Ultrasound is placed in Scott [name] sterile sleeve. The left common femoral artery is identified. It is echolucent and pulsatile, indicating patency. Image is recorded for the permanent record. Under real-time visualization, 1% lidocaine is infiltrated and then Scott [name] micropuncture needle is used to access the artery, and microwire followed by Scott [name] micro sheath, J-wire followed by Scott [name] 5 French sheath and Scott [name] 5 French pigtail catheter.   The pigtail catheter is positioned at  the level of T12, and an AP projection of the aorta is obtained. Pigtail catheter is repositioned to above the bifurcation and an LAO projection of the pelvis is obtained. Scott [name] stiff-angled Glidewire and pigtail catheter were then used to cross the aortic bifurcation. The catheter is advanced down to the distal external iliac on the right, and oblique view of the groin is obtained. Wire and catheter are then negotiated into the SFA and distal runoff is obtained.   Heparin 4000 units is given. Scott [name] stiff-angled Glidewire is reintroduced and Scott [name] 6 [place] catheter is advanced up and over the bifurcation and exchanged for the 5 [place] short sheath. Scott [name] wire and Scott [name] long angled catheter are then used to negotiate down to the trifurcation, where Scott [name] magnified image is obtained through the catheter and subsequently, Scott [name] V-18 wire is negotiated down through the posterior tibia to the lateral plantar. There are 2 areas of occlusion, short segment and an area of greater than 80% narrowing throughout the course of the posterior tibial.   Initially, Scott [name] 3 mm x 15 balloon is advanced to perform angioplasty of the tibioperoneal trunk and proximal portion of the posterior tibial. Subsequently, Scott [name] 4 x 15 Lutonix balloon is advanced across this same region and inflated to 4 atmospheres, representing Scott [name] luminal diameter of 3.5. Further downstream, first Scott [name] 2 x 22 mm balloon is advanced from the lateral plantar proximally, and subsequently the 3 mm balloon is reintroduced and the area of narrowing, as well as the area of total occlusion are secondarily angioplastied. All angioplasties are to 12 atmospheres for 2 minutes.   With the catheter advanced down to the distal popliteal, hand injection of contrast then demonstrates wide patency of the tibioperoneal trunk and  posterior tibial, with much more rapid filling of the lateral plantar. The peroneal is preserved.   Attention is then turned to the mid SFA. Magnified images obtained with hand  injection through the sheath and Scott [name] 6 x 6 Lutonix balloon is used to angioplasty an area within the mid SFA. Inflation is to 12 atmospheres for 3 minutes. Follow-up imaging demonstrates an excellent result. No evidence of residual stenosis or dissections.   Distal runoff is then verified. The tibioperoneal trunk and tibial vessels are preserved and subsequently, the sheath is pulled into the external on the left and oblique view obtained. Scott [name] StarClose device is deployed, and there are no immediate complications.   INTERPRETATION: The abdominal aorta is opacified with Scott [name] bolus injection of contrast, as are the iliacs. The aorta, common, and external iliacs are all patent bilaterally with diffuse non-hemodynamically significant stenosis. There is diffuse disease of the internal on the right. There is no visualization of the internal on the left.   The right common femoral and profunda femoris are patent. The SFA is patent, but demonstrates Scott [name] 60% to 65% narrowing in its midportion. Hunter canal and the popliteal appear to be patent. Tibioperoneal trunk is heavily diseased with diffuse, greater than 60% narrowing. The peroneal is preserved throughout its entire course and collateralizes to the foot distally. The posterior tibial is occluded at 2 points along its course, with Scott [name] greater than 80% narrowing in its midportion, for Scott [name] total of 3 separate lesions. The angioplasty of the posterior tibial is performed, beginning essentially at the origin of the lateral plantar and extending backward, maximal diameter is 3 mm in the proximal one-third, extending into the into the tibioperoneal trunk. The maximal diameter is 3.5 to 4 mm. The SFA lesion is angioplastied with excellent result to 6 mm.   SUMMARY: Successful salvage of right lower extremity with recanalization of the posterior tibial, as well as the SFA and tibioperoneal trunk, as described above.    ____________________________ Katha Cabal,  MD ggs:mw D: 10/09/2014 12:42:36 ET T: 10/09/2014 13:22:23 ET JOB#: [id number]  cc: Katha Cabal, MD, <Dictator> Gerrit Heck. Troxler, DPM Katha Cabal MD ELECTRONICALLY SIGNED 11/06/2014 14:24

## 2014-12-30 NOTE — Discharge Summary (Signed)
PATIENT NAME:  Scott [name], Scott [name] MR#:  [id number] DATE OF BIRTH:  [birth year]  DATE OF ADMISSION:  12/01/2014 DATE OF DISCHARGE:  12/04/2014  DISCHARGE DIAGNOSES:  1.  Non-ST elevation myocardial infarction.  2.  Acute diastolic congestive heart failure.  3.  Hypertension.  4.  Chronic renal failure.  5.  Hyperlipidemia.   MEDICATIONS ON DISCHARGE:  1.  Omeprazole 20 mg delayed-release once [name] day.  2.  Simvastatin 20 mg once [name] day.  3.  Humulin 70/30 take 35 units 2 times [name] day.  4.  Aspirin 81 mg once [name] day. 5.  Prozac 20 mg oral capsule once [name] day. 6.  Gabapentin 100 mg oral 3 times [name] day. 7.  Lisinopril 20 mg once [name] day.  8.  Isosorbide mononitrate 30 mg once [name] day.  9.  Clopidogrel 75 mg once [name] day.  10.  Cilostazol 50 mg oral tablet once [name] day.  11.  Carvedilol 25 mg oral 2 times [name] day.   HOME HEALTH ON DISCHARGE:  Yes. Advised to have home health services and resume the nurse.  DIET ON DISCHARGE:  Low sodium, low fat, low cholesterol, carbohydrate-controlled, and regular consistency.   ACTIVITY: As tolerated.   TIMEFRAME TO FOLLOW:  Within 1 to 2 weeks in cardiology clinic.   HISTORY OF PRESENTING ILLNESS: [name] 54 year old male with [name] history of insulin-dependent diabetes and hypertension and gastroesophageal reflux disease. He has [name] chronic wound on the right foot.  He has had that for the last few weeks.  He has been gradually getting short of breath and some chest tightness with minimal exertion, so came to the Emergency Room and was admitted for congestive heart failure in [place]:   1.  As he was admitted to telemetry and troponin was followed, on further workup his troponin started rising up so he was started on heparin drip and continued on that.  Seen by cardiology doctor. Meanwhile, he was managed by beta blocker, Imdur, aspirin, and cardiologist decided to do cardiac catheterization which was done and showed some complicated lesions and so  they decided to manage conservatively rather than going for bypass surgery and we discharged him home.  2.  Acute diastolic CHF.  He initially was given Lasix, but later on we held the Lasix as the plan was to do cardiac catheterization which might cause some worsening in his renal function which was already not too good.  3.  Hypertension. Continued lisinopril, amlodipine, and beta blocker.  4.  Chronic renal failure.  We monitored nephrology function and subsequently his kidney function remained stable after catheterization so we discharged him home the next day.  5.  Smoking. Counseled for 4 minutes to quit.  6.  Hyperkalemia.  It resolved gradually.    CONSULTATIONS IN THE HOSPITAL:  Mamie Levers, M.D. for nephrology and Dr. Neoma Laming, M.D. for cardiology.  IMPORTANT LABORATORY RESULTS IN THE HOSPITAL:   1.  WBC count 15.1, hemoglobin 10.4, platelet count 396,000.  MCV is 91 on admission.  2.  Troponin 0.05.  3.  Creatinine 1.70, sodium 139, potassium 5.6, chloride 111.  BNP was 130 on admission. 4.  Chest x-ray PA and lateral on admission showed mild interstitial edema consistent with CHF.  The culture remained negative. Troponin was 1.53.  5.  Echocardiogram was done which showed ejection fraction 50%, mild decrease in global left ventricular systolic function.  6.  Troponin went up to 4.31.  On  April 3, creatinine was 1.72 and on further follow up on April 4 creatinine went to 1.67.  Time spent on this discharge is 50 minutes.   ____________________________ Ceasar Lund Anselm Jungling, MD vgv:sp D: 12/05/2014 23:35:38 ET T: 12/06/2014 10:34:36 ET JOB#: [id number]  cc: Ceasar Lund. Anselm Jungling, MD, <Dictator> Dionisio David, MD Mamie Levers, MD   Vaughan Basta MD ELECTRONICALLY SIGNED 12/24/2014 23:35

## 2014-12-30 NOTE — Consult Note (Signed)
Brief Consult Note: Diagnosis: right leg ASO with ulceration.   Patient was seen by consultant.   Recommend to proceed with surgery or procedure.   Comments: will arrange angiogram of the right leg with interventin for next week  continue antibiotics.  Electronic Signatures: Hortencia Pilar (MD)  (Signed 05-Feb-16 17:46)  Authored: Brief Consult Note   Last Updated: 05-Feb-16 17:46 by Hortencia Pilar (MD)

## 2014-12-30 NOTE — Consult Note (Signed)
Brief Consult Note: Diagnosis: severe abcess with cellullitis and osteomyelitis right foot.   Patient was seen by consultant.   Consult note dictated.   Recommend to proceed with surgery or procedure.   Orders entered.   Discussed with Attending MD.   Comments: Drainage present at base of theird toe dorsally and plantar midfoot.  Celllulitis at both sites.  MRI appears to have osteomyelitis in most of 3rd metatarsal.  This needs I&Dwith likely 3rd ray amputation.  Will need to open both dorsally and plantarly.  Cultures from both areas taken today.  Will schedule for OR tomorrow am.  Electronic Signatures: Perry Mount (MD)  (Signed 04-Feb-16 14:24)  Authored: Brief Consult Note   Last Updated: 04-Feb-16 14:24 by Perry Mount (MD)

## 2015-01-04 ENCOUNTER — Telehealth: Payer: Self-pay | Admitting: *Deleted

## 2015-01-04 NOTE — Telephone Encounter (Signed)
Left message for Scott [name] to call [facility] to set up orientation appointment.

## 2015-01-09 ENCOUNTER — Inpatient Hospital Stay
Admission: EM | Admit: 2015-01-09 | Discharge: 2015-01-10 | DRG: 291 | Disposition: A | Discharge status: Home / Self Care | Payer: Medicaid Other | Attending: Internal Medicine | Admitting: Internal Medicine

## 2015-01-09 ENCOUNTER — Inpatient Hospital Stay
Admit: 2015-01-09 | Discharge: 2015-01-09 | Disposition: A | Discharge status: Home / Self Care | Payer: Medicaid Other | Attending: Physician Assistant | Admitting: Physician Assistant

## 2015-01-09 ENCOUNTER — Encounter: Payer: Self-pay | Admitting: *Deleted

## 2015-01-09 ENCOUNTER — Emergency Department: Payer: Medicaid Other

## 2015-01-09 DIAGNOSIS — Z9861 Coronary angioplasty status: Secondary | ICD-10-CM

## 2015-01-09 DIAGNOSIS — D638 Anemia in other chronic diseases classified elsewhere: Secondary | ICD-10-CM | POA: Diagnosis present

## 2015-01-09 DIAGNOSIS — I252 Old myocardial infarction: Secondary | ICD-10-CM | POA: Diagnosis not present

## 2015-01-09 DIAGNOSIS — J80 Acute respiratory distress syndrome: Secondary | ICD-10-CM | POA: Diagnosis present

## 2015-01-09 DIAGNOSIS — F17210 Nicotine dependence, cigarettes, uncomplicated: Secondary | ICD-10-CM | POA: Diagnosis present

## 2015-01-09 DIAGNOSIS — Z794 Long term (current) use of insulin: Secondary | ICD-10-CM

## 2015-01-09 DIAGNOSIS — I5023 Acute on chronic systolic (congestive) heart failure: Secondary | ICD-10-CM | POA: Diagnosis present

## 2015-01-09 DIAGNOSIS — I501 Left ventricular failure: Secondary | ICD-10-CM | POA: Diagnosis present

## 2015-01-09 DIAGNOSIS — Z7982 Long term (current) use of aspirin: Secondary | ICD-10-CM | POA: Diagnosis not present

## 2015-01-09 DIAGNOSIS — I739 Peripheral vascular disease, unspecified: Secondary | ICD-10-CM | POA: Diagnosis present

## 2015-01-09 DIAGNOSIS — K219 Gastro-esophageal reflux disease without esophagitis: Secondary | ICD-10-CM | POA: Diagnosis present

## 2015-01-09 DIAGNOSIS — F329 Major depressive disorder, single episode, unspecified: Secondary | ICD-10-CM | POA: Diagnosis present

## 2015-01-09 DIAGNOSIS — I130 Hypertensive heart and chronic kidney disease with heart failure and stage 1 through stage 4 chronic kidney disease, or unspecified chronic kidney disease: Principal | ICD-10-CM | POA: Diagnosis present

## 2015-01-09 DIAGNOSIS — E875 Hyperkalemia: Secondary | ICD-10-CM | POA: Diagnosis present

## 2015-01-09 DIAGNOSIS — M1990 Unspecified osteoarthritis, unspecified site: Secondary | ICD-10-CM | POA: Diagnosis present

## 2015-01-09 DIAGNOSIS — I2510 Atherosclerotic heart disease of native coronary artery without angina pectoris: Secondary | ICD-10-CM | POA: Diagnosis present

## 2015-01-09 DIAGNOSIS — Z79899 Other long term (current) drug therapy: Secondary | ICD-10-CM | POA: Diagnosis not present

## 2015-01-09 DIAGNOSIS — E119 Type 2 diabetes mellitus without complications: Secondary | ICD-10-CM | POA: Diagnosis present

## 2015-01-09 DIAGNOSIS — J810 Acute pulmonary edema: Secondary | ICD-10-CM | POA: Diagnosis present

## 2015-01-09 DIAGNOSIS — N184 Chronic kidney disease, stage 4 (severe): Secondary | ICD-10-CM | POA: Diagnosis present

## 2015-01-09 DIAGNOSIS — E780 Pure hypercholesterolemia: Secondary | ICD-10-CM | POA: Diagnosis present

## 2015-01-09 DIAGNOSIS — I110 Hypertensive heart disease with heart failure: Secondary | ICD-10-CM

## 2015-01-09 DIAGNOSIS — R0602 Shortness of breath: Secondary | ICD-10-CM

## 2015-01-09 LAB — HEMOGLOBIN A1C: Hgb A1c MFr Bld: 6.8 % — ABNORMAL HIGH (ref 4.0–6.0)

## 2015-01-09 LAB — COMPREHENSIVE METABOLIC PANEL
ALK PHOS: 117 U/L (ref 38–126)
ALT: 10 U/L — AB (ref 17–63)
AST: 12 U/L — ABNORMAL LOW (ref 15–41)
Albumin: 3.2 g/dL — ABNORMAL LOW (ref 3.5–5.0)
Anion gap: 5 (ref 5–15)
BUN: 24 mg/dL — ABNORMAL HIGH (ref 6–20)
CO2: 22 mmol/L (ref 22–32)
Calcium: 8.6 mg/dL — ABNORMAL LOW (ref 8.9–10.3)
Chloride: 117 mmol/L — ABNORMAL HIGH (ref 101–111)
Creatinine, Ser: 2.14 mg/dL — ABNORMAL HIGH (ref 0.61–1.24)
GFR calc non Af Amer: 33 mL/min — ABNORMAL LOW (ref >60)
GFR, EST AFRICAN AMERICAN: 38 mL/min — AB (ref >60)
GLUCOSE: 184 mg/dL — AB (ref 65–99)
POTASSIUM: 5.7 mmol/L — AB (ref 3.5–5.1)
SODIUM: 144 mmol/L (ref 135–145)
TOTAL PROTEIN: 7.1 g/dL (ref 6.5–8.1)
Total Bilirubin: 0.1 mg/dL — ABNORMAL LOW (ref 0.3–1.2)

## 2015-01-09 LAB — GLUCOSE, CAPILLARY
GLUCOSE-CAPILLARY: 108 mg/dL — AB (ref 70–99)
Glucose-Capillary: 180 mg/dL — ABNORMAL HIGH (ref 70–99)
Glucose-Capillary: 137 mg/dL — ABNORMAL HIGH (ref 70–99)
Glucose-Capillary: 132 mg/dL — ABNORMAL HIGH (ref 70–99)

## 2015-01-09 LAB — CK TOTAL AND CKMB (NOT AT ARMC)
CK, MB: 6.5 ng/mL — AB (ref 0.5–5.0)
Relative Index: 2.8 — ABNORMAL HIGH (ref 0.0–2.5)
Total CK: 230 U/L (ref 49–397)

## 2015-01-09 LAB — CBC
HEMATOCRIT: 30.1 % — AB (ref 40.0–52.0)
Hemoglobin: 9.7 g/dL — ABNORMAL LOW (ref 13.0–18.0)
MCH: 28.8 pg (ref 26.0–34.0)
MCHC: 32.1 g/dL (ref 32.0–36.0)
MCV: 89.7 fL (ref 80.0–100.0)
Platelets: 342 10*3/uL (ref 150–440)
RBC: 3.36 MIL/uL — AB (ref 4.40–5.90)
RDW: 16.2 % — AB (ref 11.5–14.5)
WBC: 11.7 10*3/uL — ABNORMAL HIGH (ref 3.8–10.6)

## 2015-01-09 LAB — TROPONIN I: Troponin I: <0.03 ng/mL (ref <0.031)

## 2015-01-09 LAB — BRAIN NATRIURETIC PEPTIDE: B NATRIURETIC PEPTIDE 5: 164 pg/mL — AB (ref 0.0–100.0)

## 2015-01-09 MED ORDER — IPRATROPIUM-ALBUTEROL 0.5-2.5 (3) MG/3ML IN SOLN
3.0000 mL | Freq: Once | RESPIRATORY_TRACT | Status: AC
  Administered 2015-01-09: 3 mL via RESPIRATORY_TRACT

## 2015-01-09 MED ORDER — INSULIN ASPART 100 UNIT/ML ~~LOC~~ SOLN: 3.0000 [IU] | Freq: Three times a day (TID) | SUBCUTANEOUS | Status: DC

## 2015-01-09 MED ORDER — ATORVASTATIN CALCIUM 20 MG PO TABS
80.0000 mg | ORAL_TABLET | Freq: Every day | ORAL | Status: DC
  Administered 2015-01-09 – 2015-01-10 (×2): 80 mg via ORAL
  Filled 2015-01-09 (×2): qty 4

## 2015-01-09 MED ORDER — FUROSEMIDE 10 MG/ML IJ SOLN
60.0000 mg | Freq: Two times a day (BID) | INTRAMUSCULAR | Status: DC
  Administered 2015-01-09 – 2015-01-10 (×2): 60 mg via INTRAVENOUS
  Filled 2015-01-09 (×2): qty 6

## 2015-01-09 MED ORDER — LABETALOL HCL 5 MG/ML IV SOLN
INTRAVENOUS | Status: AC
  Administered 2015-01-09: 20 mg via INTRAVENOUS
  Filled 2015-01-09: qty 4

## 2015-01-09 MED ORDER — HYDRALAZINE HCL 25 MG PO TABS
37.5000 mg | ORAL_TABLET | Freq: Three times a day (TID) | ORAL | Status: DC
  Administered 2015-01-09 – 2015-01-10 (×4): 37.5 mg via ORAL
  Filled 2015-01-09 (×4): qty 2

## 2015-01-09 MED ORDER — CARVEDILOL 6.25 MG PO TABS: 25.0000 mg | ORAL_TABLET | Freq: Two times a day (BID) | ORAL | Status: DC

## 2015-01-09 MED ORDER — SPIRONOLACTONE 25 MG PO TABS: 25.0000 mg | ORAL_TABLET | Freq: Every day | ORAL | Status: DC

## 2015-01-09 MED ORDER — HEPARIN SODIUM (PORCINE) 5000 UNIT/ML IJ SOLN
5000.0000 [IU] | Freq: Three times a day (TID) | INTRAMUSCULAR | Status: DC
  Administered 2015-01-09 – 2015-01-10 (×3): 5000 [IU] via SUBCUTANEOUS
  Filled 2015-01-09 (×3): qty 1

## 2015-01-09 MED ORDER — ASPIRIN EC 325 MG PO TBEC: 325.0000 mg | DELAYED_RELEASE_TABLET | Freq: Every day | ORAL | Status: DC

## 2015-01-09 MED ORDER — FUROSEMIDE 10 MG/ML IJ SOLN
60.0000 mg | Freq: Once | INTRAMUSCULAR | Status: AC
  Administered 2015-01-09: 60 mg via INTRAVENOUS

## 2015-01-09 MED ORDER — ASPIRIN EC 81 MG PO TBEC
81.0000 mg | DELAYED_RELEASE_TABLET | Freq: Every day | ORAL | Status: DC
  Administered 2015-01-09 – 2015-01-10 (×2): 81 mg via ORAL
  Filled 2015-01-09 (×3): qty 1

## 2015-01-09 MED ORDER — IPRATROPIUM-ALBUTEROL 0.5-2.5 (3) MG/3ML IN SOLN
RESPIRATORY_TRACT | Status: AC
Start: 2015-01-09 — End: 2015-01-09
  Administered 2015-01-09: 3 mL via RESPIRATORY_TRACT
  Filled 2015-01-09: qty 6

## 2015-01-09 MED ORDER — ISOSORB DINITRATE-HYDRALAZINE 20-37.5 MG PO TABS
1.0000 | ORAL_TABLET | Freq: Three times a day (TID) | ORAL | Status: DC
  Filled 2015-01-09 (×4): qty 1

## 2015-01-09 MED ORDER — FERROUS SULFATE 325 (65 FE) MG PO TABS
325.0000 mg | ORAL_TABLET | Freq: Three times a day (TID) | ORAL | Status: DC
  Administered 2015-01-09 – 2015-01-10 (×3): 325 mg via ORAL
  Filled 2015-01-09 (×3): qty 1

## 2015-01-09 MED ORDER — GABAPENTIN 100 MG PO CAPS
100.0000 mg | ORAL_CAPSULE | Freq: Three times a day (TID) | ORAL | Status: DC
  Administered 2015-01-09 – 2015-01-10 (×4): 100 mg via ORAL
  Filled 2015-01-09 (×4): qty 1

## 2015-01-09 MED ORDER — INSULIN ASPART PROT & ASPART (70-30 MIX) 100 UNIT/ML ~~LOC~~ SUSP
35.0000 [IU] | Freq: Two times a day (BID) | SUBCUTANEOUS | Status: DC
  Administered 2015-01-09 – 2015-01-10 (×2): 35 [IU] via SUBCUTANEOUS
  Filled 2015-01-09: qty 10
  Filled 2015-01-09: qty 350

## 2015-01-09 MED ORDER — CARVEDILOL 25 MG PO TABS
25.0000 mg | ORAL_TABLET | Freq: Two times a day (BID) | ORAL | Status: DC
  Administered 2015-01-09 – 2015-01-10 (×3): 25 mg via ORAL
  Filled 2015-01-09 (×3): qty 4
  Filled 2015-01-09: qty 1

## 2015-01-09 MED ORDER — PRASUGREL HCL 10 MG PO TABS
10.0000 mg | ORAL_TABLET | Freq: Every day | ORAL | Status: DC
  Administered 2015-01-09 – 2015-01-10 (×2): 10 mg via ORAL
  Filled 2015-01-09 (×3): qty 1

## 2015-01-09 MED ORDER — INSULIN ASPART 100 UNIT/ML ~~LOC~~ SOLN
0.0000 [IU] | Freq: Three times a day (TID) | SUBCUTANEOUS | Status: DC
  Administered 2015-01-09: 1 [IU] via SUBCUTANEOUS
  Administered 2015-01-10: 2 [IU] via SUBCUTANEOUS
  Filled 2015-01-09 (×2): qty 1
  Filled 2015-01-09: qty 2

## 2015-01-09 MED ORDER — NITROGLYCERIN 0.4 MG SL SUBL: 0.4000 mg | SUBLINGUAL_TABLET | SUBLINGUAL | Status: DC | PRN

## 2015-01-09 MED ORDER — LABETALOL HCL 5 MG/ML IV SOLN
20.0000 mg | Freq: Once | INTRAVENOUS | Status: AC
  Administered 2015-01-09: 20 mg via INTRAVENOUS

## 2015-01-09 MED ORDER — ISOSORBIDE DINITRATE 10 MG PO TABS
20.0000 mg | ORAL_TABLET | Freq: Three times a day (TID) | ORAL | Status: DC
  Administered 2015-01-09 – 2015-01-10 (×4): 20 mg via ORAL
  Filled 2015-01-09 (×2): qty 2
  Filled 2015-01-09 (×3): qty 1

## 2015-01-09 MED ORDER — NITROGLYCERIN 2 % TD OINT
1.0000 [in_us] | TOPICAL_OINTMENT | Freq: Once | TRANSDERMAL | Status: AC
  Administered 2015-01-09: 1 [in_us] via TOPICAL

## 2015-01-09 MED ORDER — SODIUM CHLORIDE 0.9 % IJ SOLN
3.0000 mL | Freq: Two times a day (BID) | INTRAMUSCULAR | Status: DC
  Administered 2015-01-09 – 2015-01-10 (×3): 3 mL via INTRAVENOUS

## 2015-01-09 MED ORDER — PANTOPRAZOLE SODIUM 40 MG PO TBEC
40.0000 mg | DELAYED_RELEASE_TABLET | Freq: Every day | ORAL | Status: DC
  Administered 2015-01-09 – 2015-01-10 (×2): 40 mg via ORAL
  Filled 2015-01-09 (×2): qty 1

## 2015-01-09 MED ORDER — NITROGLYCERIN 2 % TD OINT
TOPICAL_OINTMENT | TRANSDERMAL | Status: AC
  Administered 2015-01-09: 1 [in_us] via TOPICAL
  Filled 2015-01-09: qty 1

## 2015-01-09 MED ORDER — FUROSEMIDE 10 MG/ML IJ SOLN
INTRAMUSCULAR | Status: AC
  Administered 2015-01-09: 60 mg via INTRAVENOUS
  Filled 2015-01-09: qty 10

## 2015-01-09 NOTE — ED Notes (Signed)
Pt in room at 0555 pt seen by MD started IV given nebs lasix nitro at 0600 bp in the 240s. O2 RA holding then he states i cant breath at 0605 started NRB at 15L given labetalol at 0615 started another iv. bipap placed at 0619 doing better not cold and clammy now. Will continue to monitor. Denies pain just SOB pt has cough dry nonproductive

## 2015-01-09 NOTE — Progress Notes (Signed)
Pt. admitted to unit. Oriented to room, call bell, Ascom phones and staff. Bed in low position, SCDs and non-skid socks on. Fall safety plan reviewed. Full assessment to Epic. Will continue to monitor. Son at bedside.   Report taken from [place], ICU RN.

## 2015-01-09 NOTE — Plan of Care (Signed)
Problem: Phase I Progression Outcomes Goal: Voiding-avoid urinary catheter unless indicated Outcome: Completed/Met Date Met:  01/09/15 Patient voids well with urinal.

## 2015-01-09 NOTE — Consult Note (Signed)
Primary Cardiologist: Dr. Neoma Laming    Reason for Consultation : Acute on chronic systolic CHF   HPI : This is a 54 year old Caucasian male with past medical history of coronary artery disease, recent PCI 4 at [facility], systolic CHF, chronic kidney disease, and diabetes mellitus who presented to the ER this morning complaining of severe shortness of breath. He states shortness of breath has gotten worse over past few days but worse and much greater after cutting the lawn yesterday secondary to allergies. He denies any chest pain pressure or tightness, no palpitations or fatigue or malaise. He states breathing treatments and IV Lasix have improved shortness of breath.        Review of Systems: General: negative for chills, fever, night sweats or weight changes.  Cardiovascular: negative for chest pain, edema, orthopnea, palpitations, paroxysmal nocturnal dyspnea. Positive for shortness of breath and dyspnea on exertion Dermatological: negative for rash Respiratory: negative for cough or wheezing Urologic: negative for hematuria Abdominal: negative for nausea, vomiting, diarrhea, bright red blood per rectum, melena, or hematemesis Neurologic: negative for visual changes, syncope, or dizziness All other systems reviewed and are otherwise negative except as noted above.    Past Medical History  Diagnosis Date  . Non-Q wave myocardial infarction     /notes 12/13/2014  . Coronary artery disease     [name] 12/13/2014  . Chronic kidney disease (CKD), stage IV (severe)     [name] 12/13/2014  . Chronic disease anemia     [name] 12/13/2014  . Type II diabetes mellitus   . High cholesterol     [name] 12/13/2014  . PVD (peripheral vascular disease)     [name] 12/13/2014  . Dysrhythmia   . CHF (congestive heart failure)   . GERD (gastroesophageal reflux disease)   . Arthritis     "left arm; right leg" (12/13/2014)  . Depression      (Not in a hospital  admission)     Infusions:   No Known Allergies  History   Social History  . Marital Status: Divorced    Spouse Name: N/A  . Number of Children: N/A  . Years of Education: N/A   Occupational History  . Not on file.   Social History Main Topics  . Smoking status: Current Every Day Smoker -- 0.25 packs/day for 40 years    Types: Cigarettes  . Smokeless tobacco: Never Used  . Alcohol Use: No  . Drug Use: No  . Sexual Activity: Not Currently   Other Topics Concern  . Not on file   Social History Narrative    No family history on file.  PHYSICAL EXAM: Filed Vitals:   01/09/15 0930  BP: 187/96  Pulse: 69  Temp:   Resp: 18    No intake or output data in the 24 hours ending 01/09/15 0948  General:  Well appearing, comfortable. CPAP mask in place HEENT: normal Neck: supple. no JVD. Carotids 2+ bilat; no bruits. No lymphadenopathy or thryomegaly appreciated. Cor: PMI nondisplaced. Regular rate & rhythm. No rubs, gallops or murmurs. Lungs: clear Abdomen: soft, nontender, nondistended. No hepatosplenomegaly. No bruits or masses. Good bowel sounds. Extremities: no cyanosis, clubbing, rash, edema Neuro: alert & oriented x 3, cranial nerves grossly intact. moves all 4 extremities w/o difficulty. Affect pleasant.  ECG:  Results for orders placed or performed during the hospital encounter of 01/09/15 (from the past 24 hour(s))  Brain natriuretic peptide     Status: Abnormal   Collection Time:  01/09/15  5:35 AM  Result Value Ref Range   B Natriuretic Peptide 164.0 (H) 0.0 - 100.0 pg/mL  CBC     Status: Abnormal   Collection Time: 01/09/15  5:55 AM  Result Value Ref Range   WBC 11.7 (H) 3.8 - 10.6 K/uL   RBC 3.36 (L) 4.40 - 5.90 MIL/uL   Hemoglobin 9.7 (L) 13.0 - 18.0 g/dL   HCT 30.1 (L) 40.0 - 52.0 %   MCV 89.7 80.0 - 100.0 fL   MCH 28.8 26.0 - 34.0 pg   MCHC 32.1 32.0 - 36.0 g/dL   RDW 16.2 (H) 11.5 - 14.5 %   Platelets 342 150 - 440 K/uL  Comprehensive  metabolic panel     Status: Abnormal   Collection Time: 01/09/15  5:55 AM  Result Value Ref Range   Sodium 144 135 - 145 mmol/L   Potassium 5.7 (H) 3.5 - 5.1 mmol/L   Chloride 117 (H) 101 - 111 mmol/L   CO2 22 22 - 32 mmol/L   Glucose, Bld 184 (H) 65 - 99 mg/dL   BUN 24 (H) 6 - 20 mg/dL   Creatinine, Ser 2.14 (H) 0.61 - 1.24 mg/dL   Calcium 8.6 (L) 8.9 - 10.3 mg/dL   Total Protein 7.1 6.5 - 8.1 g/dL   Albumin 3.2 (L) 3.5 - 5.0 g/dL   AST 12 (L) 15 - 41 U/L   ALT 10 (L) 17 - 63 U/L   Alkaline Phosphatase 117 38 - 126 U/L   Total Bilirubin 0.1 (L) 0.3 - 1.2 mg/dL   GFR calc non Af Amer 33 (L) >60 mL/min   GFR calc Af Amer 38 (L) >60 mL/min   Anion gap 5 5 - 15  Troponin I     Status: None   Collection Time: 01/09/15  5:55 AM  Result Value Ref Range   Troponin I <0.03 <0.031 ng/mL  CK total and CKMB (cardiac)     Status: Abnormal   Collection Time: 01/09/15  5:55 AM  Result Value Ref Range   Total CK 230 49 - 397 U/L   CK, MB 6.5 (H) 0.5 - 5.0 ng/mL   Relative Index 2.8 (H) 0.0 - 2.5   Dg Chest Portable 1 View  01/09/2015   CLINICAL DATA:  Shortness of breath. Difficulty breathing since 4 a.m.  EXAM: PORTABLE CHEST - 1 VIEW  COMPARISON:  12/13/2014  FINDINGS: Cardiac enlargement with pulmonary vascular congestion. Mild interstitial changes suggesting edema. Similar appearance to previous study. No blunting of costophrenic angles. No pneumothorax. Mediastinal contours appear intact.  IMPRESSION: Cardiac enlargement with pulmonary vascular congestion and interstitial edema.   Electronically Signed   By: Lucienne Capers M.D.   On: 01/09/2015 06:41     ASSESSMENT: Acute on chronic CHF, CAD   PLAN/DISCUSSION: Advised continuation of IV diuresis and BP control, advised checking echocardiogram to evaluate wall motion and LVEF, continuation of patients home medications. Patient not a current candidate for cardiac catheterization at this time secondary to renal function, manage medically.  We'll consider outpatient sleep study to rule out sleep apnea as cause of malignant hypertension.   Patient and plan discussed with supervising provider, Dr. Neoma Laming, who agrees with above findings.   Kelby Fam [place], [place] 01/09/2015 9:48 AM

## 2015-01-09 NOTE — H&P (Signed)
[facility] [place] NAME: Josephe Forrestal    MR#:  [id number]  DATE OF BIRTH:  18-Sep-1960  DATE OF ADMISSION:  01/09/2015  PRIMARY CARE PHYSICIAN: No PCP Per Patient   REQUESTING/REFERRING PHYSICIAN: Marjean Donna, MD.  CHIEF COMPLAINT:   Chief Complaint  Patient presents with  . Shortness of Breath    HISTORY OF PRESENT ILLNESS:  Misha Giangregorio  is a 54 y.o. male with a known history of Coronary artery disease, CKD stage IV and anemia of chronic disease being admitted for acute on chronic systolic heart failure exacerbation. She has been feeling short of breath for last couple days. Has had a PCI 4 at [facility] about a month ago. She has been doing fine status Monday when he started having acute onset shortness of breath which was getting worse over last 1-2 days. Not catch his breath today and decided to come to the emergency department while in the ED his chest x-ray showed flash pulmonary edema and he was hypoxic requiring BiPAP and is being admitted for further evaluation and management. PAST MEDICAL HISTORY:   Past Medical History  Diagnosis Date  . Non-Q wave myocardial infarction     /notes 12/13/2014  . Coronary artery disease     [name] 12/13/2014  . Chronic kidney disease (CKD), stage IV (severe)     [name] 12/13/2014  . Chronic disease anemia     [name] 12/13/2014  . Type II diabetes mellitus   . High cholesterol     [name] 12/13/2014  . PVD (peripheral vascular disease)     [name] 12/13/2014  . Dysrhythmia   . CHF (congestive heart failure)   . GERD (gastroesophageal reflux disease)   . Arthritis     "left arm; right leg" (12/13/2014)  . Depression     PAST SURGICAL HISTORY:   Past Surgical History  Procedure Laterality Date  . Toe amputation Right 2015    4th toe  . Incision and drainage of wound Right ~ 10/2014    "4th toe foot"  . Cardiac catheterization  11/2014  . Percutaneous coronary stent  intervention (pci-s) N/A 12/14/2014    Procedure: PERCUTANEOUS CORONARY STENT INTERVENTION (PCI-S);  Surgeon: Charolette Forward, MD;  Location: [facility] CATH LAB;  Service: Cardiovascular;  Laterality: N/A;  . Percutaneous coronary stent intervention (pci-s) N/A 12/18/2014    Procedure: PERCUTANEOUS CORONARY STENT INTERVENTION (PCI-S);  Surgeon: Charolette Forward, MD;  Location: [facility] CATH LAB;  Service: Cardiovascular;  Laterality: N/A;    SOCIAL HISTORY:   History  Substance Use Topics  . Smoking status: Current Every Day Smoker -- 0.25 packs/day for 40 years    Types: Cigarettes  . Smokeless tobacco: Never Used  . Alcohol Use: No    FAMILY HISTORY:  No family history on file.  DRUG ALLERGIES:  No Known Allergies  REVIEW OF SYSTEMS:   Review of Systems  Constitutional: Negative for fever, weight loss, malaise/fatigue and diaphoresis.  HENT: Negative for ear discharge, ear pain, hearing loss, nosebleeds, sore throat and tinnitus.   Eyes: Negative for blurred vision and pain.  Respiratory: Positive for shortness of breath. Negative for cough, hemoptysis and wheezing.   Cardiovascular: Negative for chest pain, palpitations, orthopnea and leg swelling.  Gastrointestinal: Negative for heartburn, nausea, vomiting, abdominal pain, diarrhea, constipation and blood in stool.  Genitourinary: Negative for dysuria, urgency and frequency.  Musculoskeletal: Negative for myalgias and back pain.  Skin: Negative for itching and rash.  Neurological: Negative for dizziness, tingling, tremors, focal weakness, seizures, weakness and headaches.  Psychiatric/Behavioral: Negative for depression. The patient is not nervous/anxious.     MEDICATIONS AT HOME:   Prior to Admission medications   Medication Sig Start Date End Date Taking? Authorizing Provider  aspirin EC 81 MG tablet Take 81 mg by mouth daily.   Yes Historical Provider, MD  atorvastatin (LIPITOR) 80 MG tablet Take 1 tablet (80 mg total) by mouth daily at  6 PM. Patient taking differently: Take 80 mg by mouth daily.  12/19/14  Yes Charolette Forward, MD  carvedilol (COREG) 25 MG tablet Take 25 mg by mouth 2 (two) times daily with a meal.   Yes Historical Provider, MD  ferrous sulfate 325 (65 FE) MG tablet Take 1 tablet (325 mg total) by mouth 3 (three) times daily with meals. 12/19/14  Yes Charolette Forward, MD  gabapentin (NEURONTIN) 100 MG capsule Take 100 mg by mouth 3 (three) times daily.   Yes Historical Provider, MD  insulin aspart protamine- aspart (NOVOLOG MIX 70/30) (70-30) 100 UNIT/ML injection Inject 35 Units into the skin 2 (two) times daily with a meal.   Yes Historical Provider, MD  isosorbide-hydrALAZINE (BIDIL) 20-37.5 MG per tablet Take 1 tablet by mouth 3 (three) times daily. 12/19/14  Yes Charolette Forward, MD  nitroGLYCERIN (NITROSTAT) 0.4 MG SL tablet Place 1 tablet (0.4 mg total) under the tongue every 5 (five) minutes x 3 doses as needed for chest pain. 12/19/14  Yes Charolette Forward, MD  omeprazole (PRILOSEC) 20 MG capsule Take 20 mg by mouth daily.   Yes Historical Provider, MD  prasugrel (EFFIENT) 10 MG TABS tablet Take 1 tablet (10 mg total) by mouth daily. 12/19/14  Yes Charolette Forward, MD  spironolactone (ALDACTONE) 25 MG tablet Take 1 tablet (25 mg total) by mouth daily. 12/19/14  Yes Charolette Forward, MD      VITAL SIGNS:  Blood pressure 146/74, pulse 71, temperature 98.1 F (36.7 C), temperature source Oral, resp. rate 19, height 5\' 9"  (1.753 m), weight 104.327 kg (230 lb), SpO2 100 %.  PHYSICAL EXAMINATION:  GENERAL:  54 y.o.-year-old patient lying in the bed with no acute distress.  EYES: Pupils equal, round, reactive to light and accommodation. No scleral icterus. Extraocular muscles intact.  HEENT: Head atraumatic, normocephalic. Oropharynx and nasopharynx clear.  NECK:  Supple, no jugular venous distention. No thyroid enlargement, no tenderness.  LUNGS: Normal breath sounds bilaterally, no wheezing, rales,rhonchi or crepitation. No use  of accessory muscles of respiration.  decreased breath sound at bases.  CARDIOVASCULAR: S1, S2 normal. No murmurs, rubs, or gallops.  no chest wall tenderness.  ABDOMEN: Soft, nontender, nondistended. Bowel sounds present. No organomegaly or mass.  EXTREMITIES: No pedal edema, cyanosis, or clubbing.  NEUROLOGIC: Cranial nerves II through XII are intact. Muscle strength 5/5 in all extremities. Sensation intact. Gait not checked.  PSYCHIATRIC: The patient is alert and oriented x 3.  SKIN: No obvious rash, lesion, or ulcer.   LABORATORY PANEL:   CBC  Recent Labs Lab 01/09/15 0555  WBC 11.7*  HGB 9.7*  HCT 30.1*  PLT 342   ------------------------------------------------------------------------------------------------------------------  Chemistries   Recent Labs Lab 01/09/15 0555  NA 144  K 5.7*  CL 117*  CO2 22  GLUCOSE 184*  BUN 24*  CREATININE 2.14*  CALCIUM 8.6*  AST 12*  ALT 10*  ALKPHOS 117  BILITOT 0.1*   ------------------------------------------------------------------------------------------------------------------  Cardiac Enzymes  Recent Labs Lab 01/09/15 0555  TROPONINI <0.03  IMPRESSION AND PLAN:   This is a 54 year old Caucasian male with past medical history of coronary artery disease, recent PCI 4 at [facility], systolic CHF, chronic kidney disease, and diabetes mellitus who presented to the ER this morning complaining of severe shortness of breath.   * Acute on chronic systolic CHF exacerbation: IV Lasix twice a day, consult cardiology, obtain serial troponins. will monitor him in stepdown and if he gets off BiPAP will go him to telemetry unit. Strict I's and O and daily weight.  *  [place] hypertension: getting better with resuming blood pressure medications. On IV Lasix.cardiology c/s  * CKD stage IV: Nephrology consultation. This can get worse with aggressive diuresis, monitor closely.  * Hyperkalemia: Should get corrected with Lasix.  Will get nephrology consultation   All the records are reviewed and case discussed with ED provider & Nurse. Also discussed with cardiology Dr. Neoma Laming. Management plans discussed with the patient and they are in agreement.  CODE STATUS: Full code   TOTAL TIME TAKING CARE OF THIS PATIENT: 45 minutes.    [facility], Quandra Fedorchak M.D on 01/09/2015 at 1:46 PM  Between 7am to 6pm - Pager - [phone]  After 6pm go to [email or web address] - password [email or web address] [facility]  [facility]  Office  [phone]  CC: Primary care physician; No PCP Per Patient

## 2015-01-09 NOTE — ED Provider Notes (Signed)
[facility] Emergency Department Provider Note  ____________________________________________  Time seen: 5:45 AM  I have reviewed the triage vital signs and the nursing notes.   HISTORY  Chief Complaint Shortness of Breath       HPI Scott [name] is a 54 y.o. male presents with severe difficulty breathingwith onset this morning. Patient denies any chest pain of note patient noted to be markedly hypertensive on presentation to the emergency department.     Past Medical History  Diagnosis Date  . Non-Q wave myocardial infarction     /notes 12/13/2014  . Coronary artery disease     [name] 12/13/2014  . Chronic kidney disease (CKD), stage IV (severe)     [name] 12/13/2014  . Chronic disease anemia     [name] 12/13/2014  . Type II diabetes mellitus   . High cholesterol     [name] 12/13/2014  . PVD (peripheral vascular disease)     [name] 12/13/2014  . Dysrhythmia   . CHF (congestive heart failure)   . GERD (gastroesophageal reflux disease)   . Arthritis     "left arm; right leg" (12/13/2014)  . Depression     Patient Active Problem List   Diagnosis Date Noted  . Acute systolic congestive heart failure 12/13/2014    Past Surgical History  Procedure Laterality Date  . Toe amputation Right 2015    4th toe  . Incision and drainage of wound Right ~ 10/2014    "4th toe foot"  . Cardiac catheterization  11/2014  . Percutaneous coronary stent intervention (pci-s) N/A 12/14/2014    Procedure: PERCUTANEOUS CORONARY STENT INTERVENTION (PCI-S);  Surgeon: Charolette Forward, MD;  Location: [facility] CATH LAB;  Service: Cardiovascular;  Laterality: N/A;  . Percutaneous coronary stent intervention (pci-s) N/A 12/18/2014    Procedure: PERCUTANEOUS CORONARY STENT INTERVENTION (PCI-S);  Surgeon: Charolette Forward, MD;  Location: [facility] CATH LAB;  Service: Cardiovascular;  Laterality: N/A;    Current Outpatient Rx  Name  Route  Sig  Dispense  Refill  . amoxicillin-clavulanate  (AUGMENTIN) 875-125 MG per tablet   Oral   Take 1 tablet by mouth 2 (two) times daily.         [name] aspirin EC 81 MG tablet   Oral   Take 81 mg by mouth daily.         [name] atorvastatin (LIPITOR) 80 MG tablet   Oral   Take 1 tablet (80 mg total) by mouth daily at 6 PM.   30 tablet   3   . carvedilol (COREG) 25 MG tablet   Oral   Take 25 mg by mouth 2 (two) times daily with a meal.         . ferrous sulfate 325 (65 FE) MG tablet   Oral   Take 1 tablet (325 mg total) by mouth 3 (three) times daily with meals.   90 tablet   3   . gabapentin (NEURONTIN) 100 MG capsule   Oral   Take 100 mg by mouth 3 (three) times daily.         . insulin aspart protamine- aspart (NOVOLOG MIX 70/30) (70-30) 100 UNIT/ML injection   Subcutaneous   Inject 35 Units into the skin 2 (two) times daily with a meal.         . isosorbide-hydrALAZINE (BIDIL) 20-37.5 MG per tablet   Oral   Take 1 tablet by mouth 3 (three) times daily.   90 tablet   3   . nitroGLYCERIN (  NITROSTAT) 0.4 MG SL tablet   Sublingual   Place 1 tablet (0.4 mg total) under the tongue every 5 (five) minutes x 3 doses as needed for chest pain.   25 tablet   12   . omeprazole (PRILOSEC) 20 MG capsule   Oral   Take 20 mg by mouth daily.         . prasugrel (EFFIENT) 10 MG TABS tablet   Oral   Take 1 tablet (10 mg total) by mouth daily.   30 tablet   11   . spironolactone (ALDACTONE) 25 MG tablet   Oral   Take 1 tablet (25 mg total) by mouth daily.   30 tablet   3     Allergies Review of patient's allergies indicates no known allergies.  No family history on file.  Social History History  Substance Use Topics  . Smoking status: Current Every Day Smoker -- 0.25 packs/day for 40 years    Types: Cigarettes  . Smokeless tobacco: Never Used  . Alcohol Use: No    Review of Systems  Constitutional: Negative for fever. Eyes: Negative for visual changes. ENT: Negative for sore throat. Cardiovascular:  Negative for chest pain. Respiratory: Positive for shortness of breath. Gastrointestinal: Negative for abdominal pain, vomiting and diarrhea. Genitourinary: Negative for dysuria. Musculoskeletal: Negative for back pain. Skin: Negative for rash. Neurological: Negative for headaches, focal weakness or numbness.   10-point ROS otherwise negative.  ____________________________________________   PHYSICAL EXAM:  VITAL SIGNS: ED Triage Vitals  Enc Vitals Group     BP 01/09/15 0556 218/111 mmHg     Pulse Rate 01/09/15 0556 80     Resp --      Temp 01/09/15 0556 97.8 F (36.6 C)     Temp Source 01/09/15 0556 Oral     SpO2 01/09/15 0556 88 %     Weight 01/09/15 0556 230 lb (104.327 kg)     Height 01/09/15 0556 5\' 9"  (1.753 m)     Head Cir --      Peak Flow --      Pain Score --      Pain Loc --      Pain Edu? --      Excl. in [place]? --      Constitutional: Alert and oriented. Severe respiratory distress Eyes: Conjunctivae are normal. PERRL. Normal extraocular movements. ENT   Head: Normocephalic and atraumatic.   Nose: No congestion/rhinnorhea.   Mouth/Throat: Mucous membranes are moist.   Neck: No stridor. Cardiovascular: Normal rate, regular rhythm. Normal and symmetric distal pulses are present in all extremities. No murmurs, rubs, or gallops. Respiratory: Tachypnea, accessory muscle use severe respiratory distress bibasilar rales. Gastrointestinal: Soft and nontender. No distention. There is no CVA tenderness. Genitourinary: deferred Musculoskeletal: Nontender with normal range of motion in all extremities. No joint effusions.  No lower extremity tenderness nor edema. Neurologic:  Normal speech and language. No gross focal neurologic deficits are appreciated. Speech is normal.  Skin:  Skin is warm, dry and intact. No rash noted. Psychiatric: Mood and affect are normal. Speech and behavior are normal. Patient exhibits appropriate insight and  judgment.  ____________________________________________    LABS (pertinent positives/negatives)  Labs Reviewed  CBC - Abnormal; Notable for the following:    WBC 11.7 (*)    RBC 3.36 (*)    Hemoglobin 9.7 (*)    HCT 30.1 (*)    RDW 16.2 (*)    All other components within normal limits  COMPREHENSIVE  METABOLIC PANEL  TROPONIN I  CK TOTAL AND CKMB  BRAIN NATRIURETIC PEPTIDE     ____________________________________________   EKG  Date: 01/09/2015  Rate: 81  Rhythm: normal sinus rhythm  QRS Axis: normal  Intervals: normal  ST/T Wave abnormalities: normal  Conduction Disutrbances: none  Narrative Interpretation: unremarkable      ____________________________________________    RADIOLOGY  Cardiomegaly and bibasilar interstitial edema noted on chest x-ray.  ____________________________________________   Critical care note: 60 minutes  BiPAP applied nitroglycerin 2% 1 inch paste applied labetalol 20 mg IV given duo nebs 2 given for severe restrictive distress secondary to acute on chronic congestive heart failure pulmonary edema ____________________________________________   INITIAL IMPRESSION / ASSESSMENT AND PLAN / ED COURSE  Pertinent labs & imaging results that were available during my care of the patient were reviewed by me and considered in my medical decision making (see chart for details).    ____________________________________________   FINAL CLINICAL IMPRESSION(S) / ED DIAGNOSES  Final diagnoses:  Pulmonary edema cardiac cause  Congestive heart failure due to hypertension      Gregor Hams, MD 01/09/15 [phone]

## 2015-01-09 NOTE — Plan of Care (Signed)
Problem: Phase I Progression Outcomes Goal: Hemodynamically stable Patient off bipap and sating well on room air- 93%. Vitals stable. No shortness of breath.

## 2015-01-09 NOTE — ED Notes (Signed)
PT woke up tonight with shob, denies any pain hx of CHF.

## 2015-01-09 NOTE — Progress Notes (Signed)
Pt alert and oriented. Vitals stable.  Pt off bipap and on room air sating 93%- Dr. Manuella Ghazi aware- Pt to transfer to 2A.  No shortness of breath or pain.  Echo performed.

## 2015-01-09 NOTE — Plan of Care (Signed)
Problem: Phase I Progression Outcomes Goal: Pain controlled with appropriate interventions Patient having no complaints of pain.

## 2015-01-09 NOTE — Plan of Care (Signed)
Problem: Phase I Progression Outcomes Goal: Dyspnea controlled at rest (HF) Outcome: Completed/Met Date Met:  01/09/15 Patient having no shortness of breath while up in bed and ambulating to bedside commode.

## 2015-01-09 NOTE — Progress Notes (Signed)
Inpatient Diabetes Program Recommendations  AACE/ADA: New Consensus Statement on Inpatient Glycemic Control (2013)  Target Ranges:  Prepandial:   less than 140 mg/dL      Peak postprandial:   less than 180 mg/dL (1-2 hours)      Critically ill patients:  140 - 180 mg/dL   Results for VERGIL, LOATMAN (MRN [id number]) as of 01/09/2015 10:45  Ref. Range 01/09/2015 05:55  Glucose Latest Ref Range: 65-99 mg/dL 184 (H)   Diabetes history: DM2 Outpatient Diabetes medications: 70/30 35 units BID Current orders for Inpatient glycemic control: 70/30 35 units BID  Inpatient Diabetes Program Recommendations Correction (SSI): While inpatient please consider using Glycemic Control Order Set and order CBGs with Novolog sensitive correction scale ACHS.  Thanks, Barnie Alderman, RN, MSN, CCRN, CDE Diabetes Coordinator Inpatient Diabetes Program [phone] (Team Pager from [place] to [place]) [phone] ([place] office) [phone] [facility] office)

## 2015-01-10 ENCOUNTER — Inpatient Hospital Stay: Payer: Medicaid Other

## 2015-01-10 LAB — CBC
HCT: 26.4 % — ABNORMAL LOW (ref 40.0–52.0)
Hemoglobin: 8.4 g/dL — ABNORMAL LOW (ref 13.0–18.0)
MCH: 28.4 pg (ref 26.0–34.0)
MCHC: 32 g/dL (ref 32.0–36.0)
MCV: 88.9 fL (ref 80.0–100.0)
PLATELETS: 296 10*3/uL (ref 150–440)
RBC: 2.97 MIL/uL — AB (ref 4.40–5.90)
RDW: 16.1 % — ABNORMAL HIGH (ref 11.5–14.5)
WBC: 8.9 10*3/uL (ref 3.8–10.6)

## 2015-01-10 LAB — COMPREHENSIVE METABOLIC PANEL
ALBUMIN: 2.7 g/dL — AB (ref 3.5–5.0)
ALK PHOS: 96 U/L (ref 38–126)
ALT: 9 U/L — AB (ref 17–63)
AST: 12 U/L — ABNORMAL LOW (ref 15–41)
Anion gap: 6 (ref 5–15)
BILIRUBIN TOTAL: 0.2 mg/dL — AB (ref 0.3–1.2)
BUN: 24 mg/dL — ABNORMAL HIGH (ref 6–20)
CHLORIDE: 112 mmol/L — AB (ref 101–111)
CO2: 25 mmol/L (ref 22–32)
Calcium: 8.4 mg/dL — ABNORMAL LOW (ref 8.9–10.3)
Creatinine, Ser: 2.11 mg/dL — ABNORMAL HIGH (ref 0.61–1.24)
GFR calc Af Amer: 39 mL/min — ABNORMAL LOW (ref >60)
GFR calc non Af Amer: 33 mL/min — ABNORMAL LOW (ref >60)
Glucose, Bld: 177 mg/dL — ABNORMAL HIGH (ref 65–99)
POTASSIUM: 4.4 mmol/L (ref 3.5–5.1)
SODIUM: 143 mmol/L (ref 135–145)
Total Protein: 5.9 g/dL — ABNORMAL LOW (ref 6.5–8.1)

## 2015-01-10 LAB — PROTIME-INR
INR: 1.08
PROTHROMBIN TIME: 14.2 s (ref 11.4–15.0)

## 2015-01-10 LAB — GLUCOSE, CAPILLARY
Glucose-Capillary: 156 mg/dL — ABNORMAL HIGH (ref 65–99)
Glucose-Capillary: 187 mg/dL — ABNORMAL HIGH (ref 65–99)

## 2015-01-10 LAB — APTT: aPTT: 34 seconds (ref 24–36)

## 2015-01-10 MED ORDER — LORATADINE 10 MG PO TABS: 10.0000 mg | ORAL_TABLET | Freq: Every day | ORAL | Status: DC | PRN

## 2015-01-10 MED ORDER — CARVEDILOL 25 MG PO TABS: 25.0000 mg | ORAL_TABLET | Freq: Two times a day (BID) | ORAL | Status: DC

## 2015-01-10 MED ORDER — SPIRONOLACTONE 25 MG PO TABS: 25.0000 mg | ORAL_TABLET | Freq: Every day | ORAL | Status: DC

## 2015-01-10 MED ORDER — ATORVASTATIN CALCIUM 80 MG PO TABS: 80.0000 mg | ORAL_TABLET | Freq: Every day | ORAL | Status: DC

## 2015-01-10 MED ORDER — ALBUTEROL SULFATE HFA 108 (90 BASE) MCG/ACT IN AERS: 2.0000 | INHALATION_SPRAY | Freq: Four times a day (QID) | RESPIRATORY_TRACT | Status: AC | PRN

## 2015-01-10 MED ORDER — OMEPRAZOLE 20 MG PO CPDR: 20.0000 mg | DELAYED_RELEASE_CAPSULE | Freq: Every day | ORAL | Status: DC

## 2015-01-10 MED ORDER — FUROSEMIDE 20 MG PO TABS: 20.0000 mg | ORAL_TABLET | Freq: Every day | ORAL | Status: DC

## 2015-01-10 MED ORDER — INSULIN ASPART PROT & ASPART (70-30 MIX) 100 UNIT/ML ~~LOC~~ SUSP: 35.0000 [IU] | Freq: Two times a day (BID) | SUBCUTANEOUS | Status: DC

## 2015-01-10 MED ORDER — GABAPENTIN 100 MG PO CAPS: 100.0000 mg | ORAL_CAPSULE | Freq: Three times a day (TID) | ORAL | Status: DC

## 2015-01-10 MED ORDER — ASPIRIN EC 81 MG PO TBEC: 81.0000 mg | DELAYED_RELEASE_TABLET | Freq: Every day | ORAL | Status: DC

## 2015-01-10 MED ORDER — ISOSORB DINITRATE-HYDRALAZINE 20-37.5 MG PO TABS: 1.0000 | ORAL_TABLET | Freq: Three times a day (TID) | ORAL | Status: DC

## 2015-01-10 MED ORDER — PRASUGREL HCL 10 MG PO TABS: 10.0000 mg | ORAL_TABLET | Freq: Every day | ORAL | Status: DC

## 2015-01-10 MED FILL — Insulin Aspart Prot & Aspart (Human) Inj 100 Unit/ML (70-30): SUBCUTANEOUS | Qty: 0.35 | Status: AC

## 2015-01-10 NOTE — Discharge Summary (Signed)
[name] NAME: Scott [name]    MR#:  [id number]  DATE OF BIRTH:  31-Oct-1960  DATE OF ADMISSION:  01/09/2015 ADMITTING PHYSICIAN: Max Sane, MD  DATE OF DISCHARGE: 01/10/2015 12:18 PM  PRIMARY CARE PHYSICIAN: No PCP Per Patient    ADMISSION DIAGNOSIS:  Pulmonary edema cardiac cause [I50.1] Congestive heart failure due to hypertension [I11.0]  DISCHARGE DIAGNOSIS:  Active Problems:   Flash pulmonary edema   SECONDARY DIAGNOSIS:   Past Medical History  Diagnosis Date  . Non-Q wave myocardial infarction     /notes 12/13/2014  . Coronary artery disease     [name] 12/13/2014  . Chronic kidney disease (CKD), stage IV (severe)     [name] 12/13/2014  . Chronic disease anemia     [name] 12/13/2014  . Type II diabetes mellitus   . High cholesterol     [name] 12/13/2014  . PVD (peripheral vascular disease)     [name] 12/13/2014  . Dysrhythmia   . CHF (congestive heart failure)   . GERD (gastroesophageal reflux disease)   . Arthritis     "left arm; right leg" (12/13/2014)  . Depression     HOSPITAL COURSE:   This is a 54 year old Caucasian male with past medical history of coronary artery disease, recent PCI 4 at [facility], systolic CHF, chronic kidney disease, and diabetes mellitus who presented to the ER this morning complaining of severe shortness of breath.   * Acute on chronic systolic CHF exacerbation: Much improved at discharge. Off BiPAP. Was on high doses of IV Lasix. Will diuresed at this time. Changed to low-dose oral Lasix 20 at discharge. Resume home medications. Appreciate cardiology input. Echo will be done as an outpatient.  * Malignant hypertension: Blood pressure is improved. Continue all home medications. Patient is on Aldactone, hydralazine isosorbide, Coreg.  * CKD stage IV: Creatinine seems to be at baseline. Outpatient follow-up recommended. He is being discharged on Aldactone and Lasix low  dose.  * Hyperkalemia: resolved at discharge.j  * Coronary artery disease status post PCI last month-outpatient cardiology follow up. Patient on Effient. On statin and Coreg. No active chest pain at this time. Continue his aspirin. Note medication management clinic just called and said they will have to order Effient. So patient can take Plavix until then.  *Diabetes mellitus-continue 70/30 insulin at home dose.  DISCHARGE CONDITIONS:   Stable  CONSULTS OBTAINED:  Treatment Team:  Dionisio David, MD Murlean Iba, MD  DRUG ALLERGIES:  No Known Allergies  DISCHARGE MEDICATIONS:   Discharge Medication List as of 01/10/2015 11:58 AM    START taking these medications   Details  albuterol (PROVENTIL HFA;VENTOLIN HFA) 108 (90 BASE) MCG/ACT inhaler Inhale 2 puffs into the lungs every 6 (six) hours as needed for wheezing or shortness of breath., Starting 01/10/2015, Until Discontinued, Normal    furosemide (LASIX) 20 MG tablet Take 1 tablet (20 mg total) by mouth daily., Starting 01/10/2015, Until Discontinued, Normal    loratadine (CLARITIN) 10 MG tablet Take 1 tablet (10 mg total) by mouth daily as needed for allergies., Starting 01/10/2015, Until Discontinued, Normal      CONTINUE these medications which have CHANGED   Details  aspirin EC 81 MG tablet Take 1 tablet (81 mg total) by mouth daily., Starting 01/10/2015, Until Discontinued, Print    atorvastatin (LIPITOR) 80 MG tablet Take 1 tablet (80 mg total) by mouth daily at 6 PM., Starting 01/10/2015, Until Discontinued,  Normal    carvedilol (COREG) 25 MG tablet Take 1 tablet (25 mg total) by mouth 2 (two) times daily with a meal., Starting 01/10/2015, Until Discontinued, Normal    gabapentin (NEURONTIN) 100 MG capsule Take 1 capsule (100 mg total) by mouth 3 (three) times daily., Starting 01/10/2015, Until Discontinued, Normal    insulin aspart protamine- aspart (NOVOLOG MIX 70/30) (70-30) 100 UNIT/ML injection Inject 0.35 mLs (35  Units total) into the skin 2 (two) times daily with a meal., Starting 01/10/2015, Until Discontinued, Normal    isosorbide-hydrALAZINE (BIDIL) 20-37.5 MG per tablet Take 1 tablet by mouth 3 (three) times daily., Starting 01/10/2015, Until Discontinued, Normal    omeprazole (PRILOSEC) 20 MG capsule Take 1 capsule (20 mg total) by mouth daily., Starting 01/10/2015, Until Discontinued, Normal    prasugrel (EFFIENT) 10 MG TABS tablet Take 1 tablet (10 mg total) by mouth daily., Starting 01/10/2015, Until Discontinued, Normal    spironolactone (ALDACTONE) 25 MG tablet Take 1 tablet (25 mg total) by mouth daily., Starting 01/10/2015, Until Discontinued, Normal      CONTINUE these medications which have NOT CHANGED   Details  ferrous sulfate 325 (65 FE) MG tablet Take 1 tablet (325 mg total) by mouth 3 (three) times daily with meals., Starting 12/19/2014, Until Discontinued, Normal    nitroGLYCERIN (NITROSTAT) 0.4 MG SL tablet Place 1 tablet (0.4 mg total) under the tongue every 5 (five) minutes x 3 doses as needed for chest pain., Starting 12/19/2014, Until Discontinued, Normal         DISCHARGE INSTRUCTIONS:    If you experience worsening of your admission symptoms, develop shortness of breath, life threatening emergency, suicidal or homicidal thoughts you must seek medical attention immediately by calling 911 or calling your MD immediately  if symptoms less severe.  You Must read complete instructions/literature along with all the possible adverse reactions/side effects for all the Medicines you take and that have been prescribed to you. Take any new Medicines after you have completely understood and accept all the possible adverse reactions/side effects.   Please note  You were cared for by a hospitalist during your hospital stay. If you have any questions about your discharge medications or the care you received while you were in the hospital after you are discharged, you can call the unit and  asked to speak with the hospitalist on call if the hospitalist that took care of you is not available. Once you are discharged, your primary care physician will handle any further medical issues. Please note that NO REFILLS for any discharge medications will be authorized once you are discharged, as it is imperative that you return to your primary care physician (or establish a relationship with a primary care physician if you do not have one) for your aftercare needs so that they can reassess your need for medications and monitor your lab values.    Today   CHIEF COMPLAINT:   Chief Complaint  Patient presents with  . Shortness of Breath    VITAL SIGNS:  Blood pressure 139/82, pulse 66, temperature 98.2 F (36.8 C), temperature source Oral, resp. rate 18, height 5\' 9"  (1.753 m), weight 104.327 kg (230 lb), SpO2 99 %.  I/O:   Intake/Output Summary (Last 24 hours) at 01/10/15 1458 Last data filed at 01/10/15 0830  Gross per 24 hour  Intake    360 ml  Output    500 ml  Net   -140 ml    PHYSICAL EXAMINATION:   Physical  Exam  GENERAL: 54 y.o.-year-old patient lying in the bed with no acute distress.  EYES: Pupils equal, round, reactive to light and accommodation. No scleral icterus. Extraocular muscles intact.  HEENT: Head atraumatic, normocephalic. Oropharynx and nasopharynx clear.  NECK: Supple, no jugular venous distention. No thyroid enlargement, no tenderness.  LUNGS: Coarse rhonchi bibasal are bilaterally. No wheezing or rales or crepitations. No use of accessory muscles for breathing. CARDIOVASCULAR: S1, S2 normal. 3/6 systolic murmur present. No rubs or gallops..  ABDOMEN: Soft, nontender, nondistended. Bowel sounds present. No organomegaly or mass.  EXTREMITIES: No pedal edema, cyanosis, or clubbing.  NEUROLOGIC: Cranial nerves II through XII are intact. Muscle strength 5/5 in all extremities. Sensation intact. Gait not checked.  PSYCHIATRIC: The patient is alert  and oriented x 3.  SKIN: No obvious rash, lesion, or ulcer.   DATA REVIEW:   CBC  Recent Labs Lab 01/10/15 0448  WBC 8.9  HGB 8.4*  HCT 26.4*  PLT 296    Chemistries   Recent Labs Lab 01/10/15 0448  NA 143  K 4.4  CL 112*  CO2 25  GLUCOSE 177*  BUN 24*  CREATININE 2.11*  CALCIUM 8.4*  AST 12*  ALT 9*  ALKPHOS 96  BILITOT 0.2*    Cardiac Enzymes  Recent Labs Lab 01/09/15 0555  TROPONINI <0.03    Microbiology Results  Results for orders placed or performed during the hospital encounter of 12/13/14  Culture, blood (routine x 2)     Status: None   Collection Time: 12/13/14  1:00 PM  Result Value Ref Range Status   Specimen Description BLOOD RIGHT HAND  Final   Special Requests BOTTLES DRAWN AEROBIC ONLY [place]  Final   Culture   Final    NO GROWTH 5 DAYS Performed at [facility]    Report Status 12/19/2014 FINAL  Final  Culture, blood (routine x 2)     Status: None   Collection Time: 12/13/14  1:05 PM  Result Value Ref Range Status   Specimen Description BLOOD LEFT HAND  Final   Special Requests BOTTLES DRAWN AEROBIC ONLY 2.5CC  Final   Culture   Final    NO GROWTH 5 DAYS Performed at [facility]    Report Status 12/19/2014 FINAL  Final  Wound culture     Status: None   Collection Time: 12/15/14  9:02 AM  Result Value Ref Range Status   Specimen Description WOUND RIGHT FOOT  Final   Special Requests NONE  Final   Gram Stain   Final    NO WBC SEEN NO SQUAMOUS EPITHELIAL CELLS SEEN NO ORGANISMS SEEN Performed at [facility]    Culture   Final    NO GROWTH 3 DAYS Performed at [facility]    Report Status 12/18/2014 FINAL  Final  MRSA PCR Screening     Status: None   Collection Time: 12/15/14 11:33 AM  Result Value Ref Range Status   MRSA by PCR NEGATIVE NEGATIVE Final    Comment:        The GeneXpert MRSA Assay (FDA approved for NASAL specimens only), is one component of a comprehensive MRSA  colonization surveillance program. It is not intended to diagnose MRSA infection nor to guide or monitor treatment for MRSA infections.     RADIOLOGY:  X-ray Chest Pa And Lateral  01/10/2015   CLINICAL DATA:  Shortness of breath congestive heart failure  EXAM: CHEST  2 VIEW  COMPARISON:  01/09/2015  FINDINGS: Mild to moderate cardiac enlargement stable. Mild vascular congestion with mild interstitial prominence. No consolidation or effusion.  IMPRESSION: Mild interstitial edema similar to prior study.   Electronically Signed   By: Skipper Cliche M.D.   On: 01/10/2015 11:03   Dg Chest Portable 1 View  01/09/2015   CLINICAL DATA:  Shortness of breath. Difficulty breathing since 4 a.m.  EXAM: PORTABLE CHEST - 1 VIEW  COMPARISON:  12/13/2014  FINDINGS: Cardiac enlargement with pulmonary vascular congestion. Mild interstitial changes suggesting edema. Similar appearance to previous study. No blunting of costophrenic angles. No pneumothorax. Mediastinal contours appear intact.  IMPRESSION: Cardiac enlargement with pulmonary vascular congestion and interstitial edema.   Electronically Signed   By: Lucienne Capers M.D.   On: 01/09/2015 06:41    EKG:   Orders placed or performed during the hospital encounter of 01/09/15  . EKG 12-Lead  . EKG 12-Lead      Management plans discussed with the patient, family and they are in agreement.  CODE STATUS:     Code Status Orders        Start     Ordered   01/09/15 1020  Full code   Continuous     01/09/15 1019      TOTAL TIME TAKING CARE OF THIS PATIENT: 40 minutes.    Gladstone Lighter M.D on 01/10/2015 at 2:58 PM  Between 7am to 6pm - Pager - [phone]  After 6pm go to [email or web address] - password [email or web address] [facility]  [place] [facility]  Office  [phone]  CC: Primary care physician; No PCP Per Patient

## 2015-01-10 NOTE — Progress Notes (Signed)
[name] at [place] NAME: Scott [name]    MR#:  [id number]  DATE OF BIRTH:  [birth year]  SUBJECTIVE:  CHIEF COMPLAINT:   Chief Complaint  Patient presents with  . Shortness of Breath   denies any chest pain or dyspnea this morning. Off BiPAP. Feels back to normal. Feels like his allergies might have triggered this episode yesterday when he was working on [employer].  REVIEW OF SYSTEMS:  Review of Systems  Constitutional: Negative for fever and chills.  Respiratory: Negative for cough, shortness of breath and wheezing.   Cardiovascular: Negative for chest pain and palpitations.  Gastrointestinal: Negative for nausea, vomiting, abdominal pain, diarrhea and constipation.  Genitourinary: Negative for dysuria.  Neurological: Negative for dizziness, seizures and headaches.    DRUG ALLERGIES:  No Known Allergies  VITALS:  Blood pressure 162/89, pulse 73, temperature 98.3 F (36.8 C), temperature source Oral, resp. rate 20, height 5\' 9"  (1.753 m), weight 104.327 kg (230 lb), SpO2 97 %.  PHYSICAL EXAMINATION:  Physical Exam  GENERAL:  54 y.o.-year-old patient lying in the bed with no acute distress.  EYES: Pupils equal, round, reactive to light and accommodation. No scleral icterus. Extraocular muscles intact.  HEENT: Head atraumatic, normocephalic. Oropharynx and nasopharynx clear.  NECK:  Supple, no jugular venous distention. No thyroid enlargement, no tenderness.  LUNGS: Coarse rhonchi bibasal are bilaterally. No wheezing or rales or crepitations. No use of accessory muscles for breathing. CARDIOVASCULAR: S1, S2 normal. 3/6 systolic murmur present. No rubs or gallops..  ABDOMEN: Soft, nontender, nondistended. Bowel sounds present. No organomegaly or mass.  EXTREMITIES: No pedal edema, cyanosis, or clubbing.  NEUROLOGIC: Cranial nerves II through XII are intact. Muscle strength 5/5 in all extremities. Sensation intact. Gait not  checked.  PSYCHIATRIC: The patient is alert and oriented x 3.  SKIN: No obvious rash, lesion, or ulcer.    LABORATORY PANEL:   CBC  Recent Labs Lab 01/10/15 0448  WBC 8.9  HGB 8.4*  HCT 26.4*  PLT 296   ------------------------------------------------------------------------------------------------------------------  Chemistries   Recent Labs Lab 01/10/15 0448  NA 143  K 4.4  CL 112*  CO2 25  GLUCOSE 177*  BUN 24*  CREATININE 2.11*  CALCIUM 8.4*  AST 12*  ALT 9*  ALKPHOS 96  BILITOT 0.2*   ------------------------------------------------------------------------------------------------------------------  Cardiac Enzymes  Recent Labs Lab 01/09/15 0555  TROPONINI <0.03   ------------------------------------------------------------------------------------------------------------------  RADIOLOGY:  Dg Chest Portable 1 View  01/09/2015   CLINICAL DATA:  Shortness of breath. Difficulty breathing since 4 a.m.  EXAM: PORTABLE CHEST - 1 VIEW  COMPARISON:  12/13/2014  FINDINGS: Cardiac enlargement with pulmonary vascular congestion. Mild interstitial changes suggesting edema. Similar appearance to previous study. No blunting of costophrenic angles. No pneumothorax. Mediastinal contours appear intact.  IMPRESSION: Cardiac enlargement with pulmonary vascular congestion and interstitial edema.   Electronically Signed   By: Scott [name] M.D.   On: 01/09/2015 06:41    EKG:   Orders placed or performed during the hospital encounter of 01/09/15  . EKG 12-Lead  . EKG 12-Lead    ASSESSMENT AND PLAN:   This is a 54 year old Caucasian male with past medical history of coronary artery disease, recent PCI 4 at [facility], systolic CHF, chronic kidney disease, and diabetes mellitus who presented to the ER this morning complaining of severe shortness of breath.   * Acute on chronic systolic CHF exacerbation: Much improved today. Off BiPAP. Change Lasix to by mouth.  Resume  home medications. Appreciate cardiology input. Possible discharge today.  * Malignant hypertension: Blood pressure is improved. Continue all home medications.  * CKD stage IV: Creatinine seems to be at baseline. Outpatient follow-up recommended.  * Hyperkalemia: Improved this morning.  * Coronary artery disease status post PCI last month-outpatient cardiology follow up. Patient on Effient. On statin and Coreg. No active chest pain at this time. Continue his aspirin.  *Diabetes mellitus-continue 70/30 insulin at home dose.  *DVT prophylaxis-on subcutaneous heparin.   All the records are reviewed and case discussed with Care Management/Social Workerr. Management plans discussed with the patient, family and they are in agreement.  CODE STATUS: Full code  TOTAL TIME TAKING CARE OF THIS PATIENT: 38 minutes.   POSSIBLE D/C TODAY, DEPENDING ON CLINICAL CONDITION.   Scott [name] M.D on 01/10/2015 at 10:54 AM  Between 7am to 6pm - Pager - [phone]  After 6pm go to [email or web address] - password [email or web address] [facility]  [place] [facility]  Office  [phone]  CC: Primary care physician; No PCP Per Patient

## 2015-01-10 NOTE — Progress Notes (Signed)
   SUBJECTIVE: Patient is feeling much better today. He is less short of breath and denies any chest pain or orthopnea or PND or leg swelling.   Filed Vitals:   01/09/15 1400 01/09/15 1756 01/09/15 1926 01/10/15 0538  BP: 149/83 167/90 145/78 162/89  Pulse: 73 73 76 73  Temp:  98.4 F (36.9 C) 97.8 F (36.6 C) 98.3 F (36.8 C)  TempSrc:  Oral Oral   Resp: 17 18 20 20   Height:      Weight:      SpO2: 94% 96% 99% 97%    Intake/Output Summary (Last 24 hours) at 01/10/15 0906 Last data filed at 01/09/15 1646  Gross per 24 hour  Intake     60 ml  Output    500 ml  Net   -440 ml    LABS: Basic Metabolic Panel:  Recent Labs  01/09/15 0555  NA 144  K 5.7*  CL 117*  CO2 22  GLUCOSE 184*  BUN 24*  CREATININE 2.14*  CALCIUM 8.6*   Liver Function Tests:  Recent Labs  01/09/15 0555  AST 12*  ALT 10*  ALKPHOS 117  BILITOT 0.1*  PROT 7.1  ALBUMIN 3.2*   No results for input(s): LIPASE, AMYLASE in the last 72 hours. CBC:  Recent Labs  01/09/15 0555 01/10/15 0448  WBC 11.7* 8.9  HGB 9.7* 8.4*  HCT 30.1* 26.4*  MCV 89.7 88.9  PLT 342 296   Cardiac Enzymes:  Recent Labs  01/09/15 0555  CKTOTAL 230  CKMB 6.5*  TROPONINI <0.03   BNP: Invalid input(s): POCBNP D-Dimer: No results for input(s): DDIMER in the last 72 hours. Hemoglobin A1C:  Recent Labs  01/09/15 1356  HGBA1C 6.8*   Fasting Lipid Panel: No results for input(s): CHOL, HDL, LDLCALC, TRIG, CHOLHDL, LDLDIRECT in the last 72 hours. Thyroid Function Tests: No results for input(s): TSH, T4TOTAL, T3FREE, THYROIDAB in the last 72 hours.  Invalid input(s): FREET3 Anemia Panel: No results for input(s): VITAMINB12, FOLATE, FERRITIN, TIBC, IRON, RETICCTPCT in the last 72 hours.   PHYSICAL EXAM General: Well developed, well nourished, in no acute distress HEENT:  Normocephalic and atramatic Neck:  No JVD.  Lungs: Clear bilaterally to auscultation and percussion. Heart: HRRR . Normal S1  and S2 without gallops or murmurs.  Abdomen: Bowel sounds are positive, abdomen soft and non-tender  Msk:  Back normal, normal gait. Normal strength and tone for age. Extremities: No clubbing, cyanosis or edema.   Neuro: Alert and oriented X 3. Psych:  Good affect, responds appropriately  TELEMETRY: Reviewed telemetry pt in monitor shows normal sinus rhythm nonspecific ST-T changes.:  ASSESSMENT AND PLAN: Patient has history of  multivessel PCI at [facility] by Charolette Forward. Patient had PCI last month after being admitted to the emergency room at [facility]. Troponins were negative on this admission and the patient is feeling much better and thus will be discharged with a follow-up in the office tomorrow around 10:00 thank you very much for referral  Active Problems:   Flash pulmonary edema    KHAN,SHAUKAT A, MD, [facility] 01/10/2015 9:06 AM

## 2015-01-10 NOTE — Progress Notes (Signed)
Pt. Discharged to home with family accompanying. Discharge instructions and medication regimen reviewed at bedside with patient. Pt. verbalizes understanding of instructions and medication regimen. Pt given prescriptions in hand to take to home pharmacy. Patient assessment unchanged from this morning. TELE and IV discontinued per policy.

## 2015-01-10 NOTE — Discharge Instructions (Signed)

## 2015-01-10 NOTE — Care Management Note (Signed)
Case Management Note  Patient Details  Name: Scott [name] MRN: [id number] Date of Birth: 09/06/1960  Subjective/Objective:                 Presents with sudden progressive shortness of breath.  Patient is on disability and at present, has not been approved for medicaid.  His monthly income is approximately 1700 dollars a month.  He is followed at [facility] and was last seen 3 weeks ago.  He denies that has any problems obtaining his medications through [facility].  He is currently receiving no services in the home   Action/Plan:   Expected Discharge Date:                  Expected Discharge Plan:  Home/Self Care  In-House Referral:  NA (Is followed by Engineer, [employer])  Discharge planning Services  CM Consult  Post Acute Care Choice:    Choice offered to:     DME Arranged:    DME Agency:     HH Arranged:    [place] Agency:     Status of Service:     Medicare Important Message Given:    Date Medicare IM Given:    Medicare IM give by:    Date Additional Medicare IM Given:    Additional Medicare Important Message give by:     If discussed at [place] of Stay Meetings, dates discussed:    Additional Comments:  Katrina Stack, RN 01/10/2015, 8:18 AM

## 2015-01-29 ENCOUNTER — Ambulatory Visit: Payer: Self-pay

## 2015-01-29 ENCOUNTER — Encounter (INDEPENDENT_AMBULATORY_CARE_PROVIDER_SITE_OTHER): Payer: Self-pay

## 2015-02-12 ENCOUNTER — Ambulatory Visit: Payer: Self-pay

## 2015-02-12 MED FILL — Insulin Aspart Prot & Aspart (Human) Inj 100 Unit/ML (70-30): SUBCUTANEOUS | Qty: 0.35 | Status: AC

## 2015-02-26 ENCOUNTER — Ambulatory Visit: Payer: Self-pay

## 2015-02-26 DIAGNOSIS — I10 Essential (primary) hypertension: Secondary | ICD-10-CM

## 2015-02-26 DIAGNOSIS — I739 Peripheral vascular disease, unspecified: Secondary | ICD-10-CM | POA: Insufficient documentation

## 2015-03-14 ENCOUNTER — Ambulatory Visit: Payer: Self-pay | Admitting: Ophthalmology

## 2015-03-28 ENCOUNTER — Other Ambulatory Visit: Payer: Self-pay

## 2015-04-01 ENCOUNTER — Encounter: Payer: Self-pay | Attending: Cardiology | Admitting: *Deleted

## 2015-04-01 VITALS — Ht 70.0 in | Wt 241.2 lb

## 2015-04-01 DIAGNOSIS — I5020 Unspecified systolic (congestive) heart failure: Secondary | ICD-10-CM

## 2015-04-01 DIAGNOSIS — Z9861 Coronary angioplasty status: Secondary | ICD-10-CM | POA: Insufficient documentation

## 2015-04-01 DIAGNOSIS — I5022 Chronic systolic (congestive) heart failure: Secondary | ICD-10-CM | POA: Insufficient documentation

## 2015-04-01 NOTE — Patient Instructions (Signed)
Patient Instructions  Patient Details  Name: RUDIS MERCURE MRN: [id number] Date of Birth: 17-Sep-1960 Referring Provider:  Charolette Forward, MD  Below are the personal goals you chose as well as exercise and nutrition goals. Our goal is to help you keep on track towards obtaining and maintaining your goals. We will be discussing your progress on these goals with you throughout the program.  Initial Exercise Prescription:     Initial Exercise Prescription - 04/01/15 1500    Date of Initial Exercise Prescription   Date 04/01/15   Treadmill   MPH 2   Grade 0   Minutes 10   Bike   Level 0.4   Minutes 10   Recumbant Bike   Level 3   RPM 40   Watts 25   Minutes 10   NuStep   Level 3   Watts 45   Minutes 15   Arm Ergometer   Level 1   Watts 10   Minutes 10   Arm/Foot Ergometer   Level 4   Watts 15   Minutes 10   Cybex   Level 2   RPM 50   Minutes 10   Recumbant Elliptical   Level 1   RPM 40   Watts 10   Minutes 10   Elliptical   Level 1   Speed 3   Minutes 1   REL-XR   Level 3   Watts 45   Minutes 15   Prescription Details   Frequency (times per week) 3   Duration Progress to 30 minutes of continuous aerobic without signs/symptoms of physical distress   Intensity   THRR REST +  30   Ratings of Perceived Exertion 11-15   Progression Continue progressive overload as per policy without signs/symptoms or physical distress.   Resistance Training   Training Prescription Yes   Weight 2   Reps 10-15      Exercise Goals: Frequency: Be able to perform aerobic exercise three times per week working toward 3-5 days per week.  Intensity: Work with a perceived exertion of 11 (fairly light) - 15 (hard) as tolerated. Follow your new exercise prescription and watch for changes in prescription as you progress with the program. Changes will be reviewed with you when they are made.  Duration: You should be able to do 30 minutes of continuous aerobic exercise in addition  to a 5 minute warm-up and a 5 minute cool-down routine.  Nutrition Goals: Your personal nutrition goals will be established when you do your nutrition analysis with the dietician.  The following are nutrition guidelines to follow: Cholesterol < 200mg /day Sodium < 1500mg /day Fiber: Men over 50 yrs - 30 grams per day  Personal Goals:     Personal Goals and Risk Factors at Admission - 04/01/15 1410    Personal Goals and Risk Factors on Admission    Weight Management Obesity;Yes   Intervention Learn and follow the exercise and diet guidelines while in the program. Utilize the nutrition and education classes to help gain knowledge of the diet and exercise expectations in the program   Intervention Provide weight management tools through evaluation completed by registered dietician and exercise physiologist.  Establish a goal weight with participant.   Increase Aerobic Exercise and Physical Activity Sedentary;Yes   Intervention While in program, learn and follow the exercise prescription taught. Start at a low level workload and increase workload after able to maintain previous level for 30 minutes. Increase time before increasing intensity.   Intervention  Provide exercise education and an individualized exercise prescription that will provide continued progressive overload as per policy without signs/symptoms of physical distress.   Quit Smoking Yes   Number of packs per day 1 pack per week for 40 years   Quit July of 2016   Intervention Utilize your health care professional team to help with smoking cessation while in the program. Your doctor can prescribe medications to aid in cessation. The program can provide information and counseling as needed.   Understand more about Heart/Pulmonary Disease. Yes   Diabetes Yes   Goal Blood glucose control identified by blood glucose values, HgbA1C. Participant verbalizes understanding of the signs/symptoms of hyper/hypo glycemia, proper foot care and  importance of medication and nutrition plan for blood glucose control.   Intervention Provide nutrition & aerobic exercise along with prescribed medications to achieve blood glucose in normal ranges: Fasting 65-99 mg/dL   Hypertension Yes   Goal Participant will see blood pressure controlled within the values of 140/82mm/Hg or within value directed by their physician.   Intervention Provide nutrition & aerobic exercise along with prescribed medications to achieve BP 140/90 or less.   Lipids Yes   Goal Cholesterol controlled with medications as prescribed, with individualized exercise RX and with personalized nutrition plan. Value goals: LDL < 70mg , HDL > 40mg . Participant states understanding of desired cholesterol values and following prescriptions.   Intervention Provide nutrition & aerobic exercise along with prescribed medications to achieve LDL 70mg , HDL >40mg .      Tobacco Use Initial Evaluation: History  Smoking status  . Current Every Day Smoker -- 0.25 packs/day for 40 years  . Types: Cigarettes  Smokeless tobacco  . Never Used    Copy of goals given to participant.

## 2015-04-01 NOTE — Progress Notes (Signed)
Cardiac Individual Treatment Plan  Patient Details  Name: Scott [name] MRN: [id number] Date of Birth: 24-May-1961 Referring Provider:  Charolette Forward, MD  Initial Encounter Date: Date: 04/01/15  Visit Diagnosis: Systolic congestive heart failure, unspecified congestive heart failure chronicity  S/P PTCA (percutaneous transluminal coronary angioplasty)  Patient's Home Medications on Admission:  Current outpatient prescriptions:  .  albuterol (PROVENTIL HFA;VENTOLIN HFA) 108 (90 BASE) MCG/ACT inhaler, Inhale 2 puffs into the lungs every 6 (six) hours as needed for wheezing or shortness of breath., Disp: 1 Inhaler, Rfl: 2 .  aspirin EC 81 MG tablet, Take 1 tablet (81 mg total) by mouth daily., Disp: 30 tablet, Rfl: 0 .  atorvastatin (LIPITOR) 80 MG tablet, Take 1 tablet (80 mg total) by mouth daily at 6 PM., Disp: 30 tablet, Rfl: 3 .  carvedilol (COREG) 25 MG tablet, Take 1 tablet (25 mg total) by mouth 2 (two) times daily with a meal., Disp: 30 tablet, Rfl: 1 .  ferrous sulfate 325 (65 FE) MG tablet, Take 1 tablet (325 mg total) by mouth 3 (three) times daily with meals., Disp: 90 tablet, Rfl: 3 .  furosemide (LASIX) 20 MG tablet, Take 1 tablet (20 mg total) by mouth daily., Disp: 30 tablet, Rfl: 0 .  gabapentin (NEURONTIN) 100 MG capsule, Take 1 capsule (100 mg total) by mouth 3 (three) times daily., Disp: 90 capsule, Rfl: 0 .  insulin aspart protamine- aspart (NOVOLOG MIX 70/30) (70-30) 100 UNIT/ML injection, Inject 0.35 mLs (35 Units total) into the skin 2 (two) times daily with a meal., Disp: 20 mL, Rfl: 0 .  isosorbide-hydrALAZINE (BIDIL) 20-37.5 MG per tablet, Take 1 tablet by mouth 3 (three) times daily., Disp: 90 tablet, Rfl: 0 .  loratadine (CLARITIN) 10 MG tablet, Take 1 tablet (10 mg total) by mouth daily as needed for allergies., Disp: 30 tablet, Rfl: 0 .  nitroGLYCERIN (NITROSTAT) 0.4 MG SL tablet, Place 1 tablet (0.4 mg total) under the tongue every 5 (five) minutes x 3 doses  as needed for chest pain., Disp: 25 tablet, Rfl: 12 .  omeprazole (PRILOSEC) 20 MG capsule, Take 1 capsule (20 mg total) by mouth daily., Disp: 30 capsule, Rfl: 0 .  prasugrel (EFFIENT) 10 MG TABS tablet, Take 1 tablet (10 mg total) by mouth daily., Disp: 30 tablet, Rfl: 0 .  spironolactone (ALDACTONE) 25 MG tablet, Take 1 tablet (25 mg total) by mouth daily., Disp: 30 tablet, Rfl: 0  Past Medical History: Past Medical History  Diagnosis Date  . Non-Q wave myocardial infarction     /notes 12/13/2014  . Coronary artery disease     [name] 12/13/2014  . Chronic kidney disease (CKD), stage IV (severe)     [name] 12/13/2014  . Chronic disease anemia     [name] 12/13/2014  . Type II diabetes mellitus   . High cholesterol     [name] 12/13/2014  . PVD (peripheral vascular disease)     [name] 12/13/2014  . Dysrhythmia   . CHF (congestive heart failure)   . GERD (gastroesophageal reflux disease)   . Arthritis     "left arm; right leg" (12/13/2014)  . Depression     Tobacco Use: History  Smoking status  . Current Every Day Smoker -- 0.25 packs/day for 40 years  . Types: Cigarettes  Smokeless tobacco  . Never Used    Labs: Recent Review Flowsheet Data    Labs for ITP Cardiac and Pulmonary Rehab Latest Ref Rng 07/13/2013 12/02/2014 12/13/2014 12/14/2014 01/09/2015  Cholestrol 0 - 200 mg/dL 156 146 - 100 -   LDLCALC 0 - 99 mg/dL 90 74 - 43 -   HDL >39 mg/dL 44 45 - 37(L) -   Trlycerides <150 mg/dL 109 134 - 98 -   Hemoglobin A1c 4.0 - 6.0 % - - 7.7(H) - 6.8(H)       Exercise Target Goals: Date: 04/01/15  Exercise Program Goal: Individual exercise prescription set with THRR, safety & activity barriers. Participant demonstrates ability to understand and report RPE using BORG scale, to self-measure pulse accurately, and to acknowledge the importance of the exercise prescription.  Exercise Prescription Goal: Starting with aerobic activity 30 plus minutes a day, 3 days per week for initial  exercise prescription. Provide home exercise prescription and guidelines that participant acknowledges understanding prior to discharge.  Activity Barriers & Risk Stratification:     Activity Barriers & Risk Stratification - 04/01/15 1403    Activity Barriers & Risk Stratification   Activity Barriers Shortness of Breath;Balance Concerns;Arthritis;Other (comment)   Comments Left arm arthrithis   Risk Stratification High      6 Minute Walk:     6 Minute Walk      04/01/15 1504       6 Minute Walk   Phase Initial     Distance 1175 feet     Walk Time 6 minutes     Resting HR 73 bpm     Resting BP 110/70 mmHg     Max Ex. HR 86 bpm     Max Ex. BP 110/70 mmHg     RPE 13     Symptoms No        Initial Exercise Prescription:     Initial Exercise Prescription - 04/01/15 1500    Date of Initial Exercise Prescription   Date 04/01/15   Treadmill   MPH 2   Grade 0   Minutes 10   Bike   Level 0.4   Minutes 10   Recumbant Bike   Level 3   RPM 40   Watts 25   Minutes 10   NuStep   Level 3   Watts 45   Minutes 15   Arm Ergometer   Level 1   Watts 10   Minutes 10   Arm/Foot Ergometer   Level 4   Watts 15   Minutes 10   Cybex   Level 2   RPM 50   Minutes 10   Recumbant Elliptical   Level 1   RPM 40   Watts 10   Minutes 10   Elliptical   Level 1   Speed 3   Minutes 1   REL-XR   Level 3   Watts 45   Minutes 15   Prescription Details   Frequency (times per week) 3   Duration Progress to 30 minutes of continuous aerobic without signs/symptoms of physical distress   Intensity   THRR REST +  30   Ratings of Perceived Exertion 11-15   Progression Continue progressive overload as per policy without signs/symptoms or physical distress.   Resistance Training   Training Prescription Yes   Weight 2   Reps 10-15      Exercise Prescription Changes:   Discharge Exercise Prescription (Final Exercise Prescription Changes):   Nutrition:  Target Goals:  Understanding of nutrition guidelines, daily intake of sodium 1500mg , cholesterol 200mg , calories 30% from fat and 7% or less from saturated fats, daily to have 5 or more servings of fruits  and vegetables.  Biometrics:     Pre Biometrics - 04/01/15 1508    Pre Biometrics   Height 5\' 10"  (1.778 m)   Weight 241 lb 3.2 oz (109.408 kg)   Waist Circumference 49.25 inches   Hip Circumference 41.25 inches   Waist to Hip Ratio 1.19 %   BMI (Calculated) 34.7       Nutrition Therapy Plan and Nutrition Goals:   Nutrition Discharge: Rate Your Plate Scores:   Nutrition Goals Re-Evaluation:   Psychosocial: Target Goals: Acknowledge presence or absence of depression, maximize coping skills, provide positive support system. Participant is able to verbalize types and ability to use techniques and skills needed for reducing stress and depression.  Initial Review & Psychosocial Screening:     Initial Psych Review & Screening - 04/01/15 1407    Initial Review   Current issues with Current Sleep Concerns   Family Dynamics   Concerns No support system   Barriers   Psychosocial barriers to participate in program There are no identifiable barriers or psychosocial needs.;The patient should benefit from training in stress management and relaxation.   Screening Interventions   Interventions Encouraged to exercise;Program counselor consult      Quality of Life Scores:   PHQ-9:     Recent Review Flowsheet Data    Depression screen [facility] 2/9 04/01/2015   Decreased Interest 1   Down, Depressed, Hopeless 1   PHQ - 2 Score 2   Altered sleeping 3   Tired, decreased energy 3   Change in appetite 3   Feeling bad or failure about yourself  3   Trouble concentrating 3   Moving slowly or fidgety/restless 3   Suicidal thoughts 0   PHQ-9 Score 20   Difficult doing work/chores Not difficult at all      Psychosocial Evaluation and Intervention:   Psychosocial Re-Evaluation:   Vocational  Rehabilitation: Provide vocational rehab assistance to qualifying candidates.   Vocational Rehab Evaluation & Intervention:     Vocational Rehab - 04/01/15 1405    Initial Vocational Rehab Evaluation & Intervention   Assessment shows need for Vocational Rehabilitation No      Education: Education Goals: Education classes will be provided on a weekly basis, covering required topics. Participant will state understanding/return demonstration of topics presented.  Learning Barriers/Preferences:     Learning Barriers/Preferences - 04/01/15 1404    Learning Barriers/Preferences   Learning Barriers None   Learning Preferences Video      Education Topics: General Nutrition Guidelines/Fats and Fiber: -Group instruction provided by verbal, written material, models and posters to present the general guidelines for heart healthy nutrition. Gives an explanation and review of dietary fats and fiber.   Controlling Sodium/Reading Food Labels: -Group verbal and written material supporting the discussion of sodium use in heart healthy nutrition. Review and explanation with models, verbal and written materials for utilization of the food label.   Exercise Physiology & Risk Factors: - Group verbal and written instruction with models to review the exercise physiology of the cardiovascular system and associated critical values. Details cardiovascular disease risk factors and the goals associated with each risk factor.   Aerobic Exercise & Resistance Training: - Gives group verbal and written discussion on the health impact of inactivity. On the components of aerobic and resistive training programs and the benefits of this training and how to safely progress through these programs.   Flexibility, Balance, General Exercise Guidelines: - Provides group verbal and written instruction on the benefits of  flexibility and balance training programs. Provides general exercise guidelines with specific  guidelines to those with heart or lung disease. Demonstration and skill practice provided.   Stress Management: - Provides group verbal and written instruction about the health risks of elevated stress, cause of high stress, and healthy ways to reduce stress.   Depression: - Provides group verbal and written instruction on the correlation between heart/lung disease and depressed mood, treatment options, and the stigmas associated with seeking treatment.   Anatomy & Physiology of the Heart: - Group verbal and written instruction and models provide basic cardiac anatomy and physiology, with the coronary electrical and arterial systems. Review of: AMI, Angina, Valve disease, Heart Failure, Cardiac Arrhythmia, Pacemakers, and the ICD.   Cardiac Procedures: - Group verbal and written instruction and models to describe the testing methods done to diagnose heart disease. Reviews the outcomes of the test results. Describes the treatment choices: Medical Management, Angioplasty, or Coronary Bypass Surgery.   Cardiac Medications: - Group verbal and written instruction to review commonly prescribed medications for heart disease. Reviews the medication, class of the drug, and side effects. Includes the steps to properly store meds and maintain the prescription regimen.   Go Sex-Intimacy & Heart Disease, Get SMART - Goal Setting: - Group verbal and written instruction through game format to discuss heart disease and the return to sexual intimacy. Provides group verbal and written material to discuss and apply goal setting through the application of the S.M.A.R.T. Method.   Other Matters of the Heart: - Provides group verbal, written materials and models to describe Heart Failure, Angina, Valve Disease, and Diabetes in the realm of heart disease. Includes description of the disease process and treatment options available to the cardiac patient.   Exercise & Equipment Safety: - Individual verbal  instruction and demonstration of equipment use and safety with use of the equipment.          Cardiac Rehab from 04/01/2015 in [facility] Cardiac Rehab   Date  04/01/15   Educator  S Hussien Greenblatt   Instruction Review Code  2- meets goals/outcomes      Infection Prevention: - Provides verbal and written material to individual with discussion of infection control including proper hand washing and proper equipment cleaning during exercise session.      Cardiac Rehab from 04/01/2015 in [facility]iac Rehab   Date  04/01/15   Educator  S Dareth Andrew   Instruction Review Code  2- meets goals/outcomes      Falls Prevention: - Provides verbal and written material to individual with discussion of falls prevention and safety.      Cardiac Rehab from 04/01/2015 in [facility] Cardiac Rehab   Date  04/01/15   Educator  S Taequan Stockhausen   Instruction Review Code  2- meets goals/outcomes      Diabetes: - Individual verbal and written instruction to review signs/symptoms of diabetes, desired ranges of glucose level fasting, after meals and with exercise. Advice that pre and post exercise glucose checks will be done for 3 sessions at entry of program.      Cardiac Rehab from 04/01/2015 in [facility] Cardiac Rehab   Date  04/01/15   Educator  S Allyssia Skluzacek   Instruction Review Code  2- meets goals/outcomes       Knowledge Questionnaire Score:   Personal Goals and Risk Factors at Admission:     Personal Goals and Risk Factors at Admission - 04/01/15 1410    Personal Goals and Risk Factors on Admission  Weight Management Obesity;Yes   Intervention Learn and follow the exercise and diet guidelines while in the program. Utilize the nutrition and education classes to help gain knowledge of the diet and exercise expectations in the program   Intervention Provide weight management tools through evaluation completed by registered dietician and exercise physiologist.  Establish a goal weight with participant.   Increase Aerobic Exercise and Physical  Activity Sedentary;Yes   Intervention While in program, learn and follow the exercise prescription taught. Start at a low level workload and increase workload after able to maintain previous level for 30 minutes. Increase time before increasing intensity.   Intervention Provide exercise education and an individualized exercise prescription that will provide continued progressive overload as per policy without signs/symptoms of physical distress.   Quit Smoking Yes   Number of packs per day 1 pack per week for 40 years   Quit July of 2016   Intervention Utilize your health care professional team to help with smoking cessation while in the program. Your doctor can prescribe medications to aid in cessation. The program can provide information and counseling as needed.   Understand more about Heart/Pulmonary Disease. Yes   Diabetes Yes   Goal Blood glucose control identified by blood glucose values, HgbA1C. Participant verbalizes understanding of the signs/symptoms of hyper/hypo glycemia, proper foot care and importance of medication and nutrition plan for blood glucose control.   Intervention Provide nutrition & aerobic exercise along with prescribed medications to achieve blood glucose in normal ranges: Fasting 65-99 mg/dL   Hypertension Yes   Goal Participant will see blood pressure controlled within the values of 140/62mm/Hg or within value directed by their physician.   Intervention Provide nutrition & aerobic exercise along with prescribed medications to achieve BP 140/90 or less.   Lipids Yes   Goal Cholesterol controlled with medications as prescribed, with individualized exercise RX and with personalized nutrition plan. Value goals: LDL < 70mg , HDL > 40mg . Participant states understanding of desired cholesterol values and following prescriptions.   Intervention Provide nutrition & aerobic exercise along with prescribed medications to achieve LDL 70mg , HDL >40mg .      Personal Goals and Risk  Factors Review:    Personal Goals Discharge:     Comments: Initial ITP 30 day review.  Ritik has completed orientation and will start classes soon.

## 2015-04-03 ENCOUNTER — Encounter: Payer: Self-pay | Admitting: *Deleted

## 2015-04-03 DIAGNOSIS — I5020 Unspecified systolic (congestive) heart failure: Secondary | ICD-10-CM

## 2015-04-03 DIAGNOSIS — Z9861 Coronary angioplasty status: Secondary | ICD-10-CM

## 2015-04-04 ENCOUNTER — Ambulatory Visit: Payer: Self-pay

## 2015-04-04 DIAGNOSIS — K219 Gastro-esophageal reflux disease without esophagitis: Secondary | ICD-10-CM | POA: Insufficient documentation

## 2015-04-08 ENCOUNTER — Encounter: Payer: Self-pay | Admitting: *Deleted

## 2015-04-08 DIAGNOSIS — I5020 Unspecified systolic (congestive) heart failure: Secondary | ICD-10-CM

## 2015-04-08 DIAGNOSIS — Z9861 Coronary angioplasty status: Secondary | ICD-10-CM

## 2015-04-08 LAB — GLUCOSE, CAPILLARY
GLUCOSE-CAPILLARY: 276 mg/dL — AB (ref 65–99)
GLUCOSE-CAPILLARY: 263 mg/dL — AB (ref 65–99)

## 2015-04-08 NOTE — Progress Notes (Signed)
Daily Session Note  Patient Details  Name: Scott [name] MRN: [id number] Date of Birth: [birth year] Referring Provider:  Charolette Forward, MD  Encounter Date: 04/08/2015  Check In:     Session Check In - 04/08/15 1800    Check-In   Staff Present Heath Lark RN, BSN, CCRP;Jet Traynham BS, ACSM CEP Exercise Physiologist;Carroll Enterkin RN, BSN   ER physicians immediately available to respond to emergencies See telemetry face sheet for immediately available ER MD   Medication changes reported     No   Fall or balance concerns reported    No   Warm-up and Cool-down Performed on first and last piece of equipment   VAD Patient? No   Pain Assessment   Currently in Pain? No/denies   Multiple Pain Sites No           Exercise Prescription Changes - 04/08/15 0700    Exercise Review   Progression No  Has not started program yet      Goals Met:  Exercise tolerated well No report of cardiac concerns or symptoms Strength training completed today  Goals Unmet:  Not Applicable  Goals Comments: Patient's first day, tolerated exercise workloads well.    Dr. Emily Filbert is Medical Director for [facility] and [facility].

## 2015-04-08 NOTE — Progress Notes (Signed)
Daily Session Note  Patient Details  Name: Scott [name] MRN: [id number] Date of Birth: [birth year] Referring Provider:  Charolette Forward, MD  Encounter Date: 04/08/2015  Check In:     Session Check In - 04/08/15 1800    Check-In   Staff Present Heath Lark RN, BSN, CCRP;Kelly Hayes BS, ACSM CEP Exercise Physiologist;Carroll Enterkin RN, BSN   ER physicians immediately available to respond to emergencies See telemetry face sheet for immediately available ER MD   Medication changes reported     No   Fall or balance concerns reported    No   Warm-up and Cool-down Performed on first and last piece of equipment   VAD Patient? No   Pain Assessment   Currently in Pain? No/denies   Multiple Pain Sites No           Exercise Prescription Changes - 04/08/15 0700    Exercise Review   Progression No  Has not started program yet      Goals Met:  Changing diet to healthy choices, watchign portion sizes Personal goals reviewed No report of cardiac concerns or symptoms Strength training completed today  Goals Unmet:  Not Applicable  Goals Comments: Did well first full exercise session.   Dr. Emily Filbert is Medical Director for [facility] and [facility].

## 2015-04-10 ENCOUNTER — Encounter: Payer: Self-pay | Admitting: *Deleted

## 2015-04-10 DIAGNOSIS — I5020 Unspecified systolic (congestive) heart failure: Secondary | ICD-10-CM

## 2015-04-10 LAB — GLUCOSE, CAPILLARY
GLUCOSE-CAPILLARY: 174 mg/dL — AB (ref 65–99)
Glucose-Capillary: 128 mg/dL — ABNORMAL HIGH (ref 65–99)

## 2015-04-10 NOTE — Progress Notes (Signed)
Daily Session Note  Patient Details  Name: Scott [name] MRN: [id number] Date of Birth: 1961/03/16 Referring Provider:  Charolette Forward, MD  Encounter Date: 04/10/2015  Check In:     Session Check In - 04/10/15 1618    Check-In   Staff Present Diane Joya Gaskins RN, BSN;Lakisha Peyser Dillard Essex MS, ACSM CEP Exercise Physiologist;Carroll Enterkin RN, BSN   ER physicians immediately available to respond to emergencies See telemetry face sheet for immediately available ER MD   Medication changes reported     No   Fall or balance concerns reported    No   Warm-up and Cool-down Performed on first and last piece of equipment   VAD Patient? No   Pain Assessment   Currently in Pain? No/denies   Multiple Pain Sites No         Goals Met:  Independence with exercise equipment Exercise tolerated well No report of cardiac concerns or symptoms Strength training completed today  Goals Unmet:  Not Applicable  Goals Comments:    Dr. Emily Filbert is Medical Director for [facility] and [facility].

## 2015-04-12 ENCOUNTER — Encounter: Payer: Self-pay | Admitting: *Deleted

## 2015-04-12 DIAGNOSIS — I5020 Unspecified systolic (congestive) heart failure: Secondary | ICD-10-CM

## 2015-04-12 DIAGNOSIS — Z9861 Coronary angioplasty status: Secondary | ICD-10-CM

## 2015-04-12 NOTE — Progress Notes (Signed)
Cardiac Individual Treatment Plan  Patient Details  Name: Scott [name] MRN: [id number] Date of Birth: 1961/07/25 Referring Provider:  Charolette Forward, MD  Initial Encounter Date:    Visit Diagnosis: Systolic congestive heart failure, unspecified congestive heart failure chronicity  S/P PTCA (percutaneous transluminal coronary angioplasty)  Patient's Home Medications on Admission:  Current outpatient prescriptions:  .  albuterol (PROVENTIL HFA;VENTOLIN HFA) 108 (90 BASE) MCG/ACT inhaler, Inhale 2 puffs into the lungs every 6 (six) hours as needed for wheezing or shortness of breath., Disp: 1 Inhaler, Rfl: 2 .  aspirin EC 81 MG tablet, Take 1 tablet (81 mg total) by mouth daily., Disp: 30 tablet, Rfl: 0 .  atorvastatin (LIPITOR) 80 MG tablet, Take 1 tablet (80 mg total) by mouth daily at 6 PM., Disp: 30 tablet, Rfl: 3 .  carvedilol (COREG) 25 MG tablet, Take 1 tablet (25 mg total) by mouth 2 (two) times daily with a meal., Disp: 30 tablet, Rfl: 1 .  ferrous sulfate 325 (65 FE) MG tablet, Take 1 tablet (325 mg total) by mouth 3 (three) times daily with meals., Disp: 90 tablet, Rfl: 3 .  furosemide (LASIX) 20 MG tablet, Take 1 tablet (20 mg total) by mouth daily., Disp: 30 tablet, Rfl: 0 .  gabapentin (NEURONTIN) 100 MG capsule, Take 1 capsule (100 mg total) by mouth 3 (three) times daily., Disp: 90 capsule, Rfl: 0 .  insulin aspart protamine- aspart (NOVOLOG MIX 70/30) (70-30) 100 UNIT/ML injection, Inject 0.35 mLs (35 Units total) into the skin 2 (two) times daily with a meal., Disp: 20 mL, Rfl: 0 .  isosorbide-hydrALAZINE (BIDIL) 20-37.5 MG per tablet, Take 1 tablet by mouth 3 (three) times daily., Disp: 90 tablet, Rfl: 0 .  loratadine (CLARITIN) 10 MG tablet, Take 1 tablet (10 mg total) by mouth daily as needed for allergies., Disp: 30 tablet, Rfl: 0 .  nitroGLYCERIN (NITROSTAT) 0.4 MG SL tablet, Place 1 tablet (0.4 mg total) under the tongue every 5 (five) minutes x 3 doses as needed for  chest pain., Disp: 25 tablet, Rfl: 12 .  omeprazole (PRILOSEC) 20 MG capsule, Take 1 capsule (20 mg total) by mouth daily., Disp: 30 capsule, Rfl: 0 .  prasugrel (EFFIENT) 10 MG TABS tablet, Take 1 tablet (10 mg total) by mouth daily., Disp: 30 tablet, Rfl: 0 .  spironolactone (ALDACTONE) 25 MG tablet, Take 1 tablet (25 mg total) by mouth daily., Disp: 30 tablet, Rfl: 0  Past Medical History: Past Medical History  Diagnosis Date  . Non-Q wave myocardial infarction     /notes 12/13/2014  . Coronary artery disease     [name] 12/13/2014  . Chronic kidney disease (CKD), stage IV (severe)     [name] 12/13/2014  . Chronic disease anemia     [name] 12/13/2014  . Type II diabetes mellitus   . High cholesterol     [name] 12/13/2014  . PVD (peripheral vascular disease)     [name] 12/13/2014  . Dysrhythmia   . CHF (congestive heart failure)   . GERD (gastroesophageal reflux disease)   . Arthritis     "left arm; right leg" (12/13/2014)  . Depression     Tobacco Use: History  Smoking status  . Current Every Day Smoker -- 0.25 packs/day for 40 years  . Types: Cigarettes  Smokeless tobacco  . Never Used    Labs: Recent Review Flowsheet Data    Labs for ITP Cardiac and Pulmonary Rehab Latest Ref Rng 07/13/2013 12/02/2014 12/13/2014 12/14/2014 01/09/2015  Cholestrol 0 - 200 mg/dL 156 146 - 100 -   LDLCALC 0 - 99 mg/dL 90 74 - 43 -   HDL >39 mg/dL 44 45 - 37(L) -   Trlycerides <150 mg/dL 109 134 - 98 -   Hemoglobin A1c 4.0 - 6.0 % - - 7.7(H) - 6.8(H)       Exercise Target Goals:    Exercise Program Goal: Individual exercise prescription set with THRR, safety & activity barriers. Participant demonstrates ability to understand and report RPE using BORG scale, to self-measure pulse accurately, and to acknowledge the importance of the exercise prescription.  Exercise Prescription Goal: Starting with aerobic activity 30 plus minutes a day, 3 days per week for initial exercise prescription.  Provide home exercise prescription and guidelines that participant acknowledges understanding prior to discharge.  Activity Barriers & Risk Stratification:     Activity Barriers & Risk Stratification - 04/01/15 1403    Activity Barriers & Risk Stratification   Activity Barriers Shortness of Breath;Balance Concerns;Arthritis;Other (comment)   Comments Left arm arthrithis   Risk Stratification High      6 Minute Walk:     6 Minute Walk      04/01/15 1504       6 Minute Walk   Phase Initial     Distance 1175 feet     Walk Time 6 minutes     Resting HR 73 bpm     Resting BP 110/70 mmHg     Max Ex. HR 86 bpm     Max Ex. BP 110/70 mmHg     RPE 13     Symptoms No        Initial Exercise Prescription:     Initial Exercise Prescription - 04/01/15 1500    Date of Initial Exercise Prescription   Date 04/01/15   Treadmill   MPH 2   Grade 0   Minutes 10   Bike   Level 0.4   Minutes 10   Recumbant Bike   Level 3   RPM 40   Watts 25   Minutes 10   NuStep   Level 3   Watts 45   Minutes 15   Arm Ergometer   Level 1   Watts 10   Minutes 10   Arm/Foot Ergometer   Level 4   Watts 15   Minutes 10   Cybex   Level 2   RPM 50   Minutes 10   Recumbant Elliptical   Level 1   RPM 40   Watts 10   Minutes 10   Elliptical   Level 1   Speed 3   Minutes 1   REL-XR   Level 3   Watts 45   Minutes 15   Prescription Details   Frequency (times per week) 3   Duration Progress to 30 minutes of continuous aerobic without signs/symptoms of physical distress   Intensity   THRR REST +  30   Ratings of Perceived Exertion 11-15   Progression Continue progressive overload as per policy without signs/symptoms or physical distress.   Resistance Training   Training Prescription Yes   Weight 2   Reps 10-15      Exercise Prescription Changes:     Exercise Prescription Changes      04/08/15 0700           Exercise Review   Progression No  Has not started program  yet          Discharge  Exercise Prescription (Final Exercise Prescription Changes):     Exercise Prescription Changes - 04/08/15 0700    Exercise Review   Progression No  Has not started program yet      Nutrition:  Target Goals: Understanding of nutrition guidelines, daily intake of sodium 1500mg , cholesterol 200mg , calories 30% from fat and 7% or less from saturated fats, daily to have 5 or more servings of fruits and vegetables.  Biometrics:     Pre Biometrics - 04/01/15 1508    Pre Biometrics   Height 5\' 10"  (1.778 m)   Weight 241 lb 3.2 oz (109.408 kg)   Waist Circumference 49.25 inches   Hip Circumference 41.25 inches   Waist to Hip Ratio 1.19 %   BMI (Calculated) 34.7       Nutrition Therapy Plan and Nutrition Goals:   Nutrition Discharge: Rate Your Plate Scores:   Nutrition Goals Re-Evaluation:   Psychosocial: Target Goals: Acknowledge presence or absence of depression, maximize coping skills, provide positive support system. Participant is able to verbalize types and ability to use techniques and skills needed for reducing stress and depression.  Initial Review & Psychosocial Screening:     Initial Psych Review & Screening - 04/01/15 1407    Initial Review   Current issues with Current Sleep Concerns   Family Dynamics   Concerns No support system   Barriers   Psychosocial barriers to participate in program There are no identifiable barriers or psychosocial needs.;The patient should benefit from training in stress management and relaxation.   Screening Interventions   Interventions Encouraged to exercise;Program counselor consult      Quality of Life Scores:     Quality of Life - 04/03/15 1103    Quality of Life Scores   Health/Function Pre 14.8 %   Socioeconomic Pre 13.69 %   Psych/Spiritual Pre 3.21 %   Family Pre 22.8 %   GLOBAL Pre 13.37 %      PHQ-9:     Recent Review Flowsheet Data    Depression screen PHQ 2/9 04/01/2015    Decreased Interest 1   Down, Depressed, Hopeless 1   PHQ - 2 Score 2   Altered sleeping 3   Tired, decreased energy 3   Change in appetite 3   Feeling bad or failure about yourself  3   Trouble concentrating 3   Moving slowly or fidgety/restless 3   Suicidal thoughts 0   PHQ-9 Score 20   Difficult doing work/chores Not difficult at all      Psychosocial Evaluation and Intervention:   Psychosocial Re-Evaluation:   Vocational Rehabilitation: Provide vocational rehab assistance to qualifying candidates.   Vocational Rehab Evaluation & Intervention:     Vocational Rehab - 04/01/15 1405    Initial Vocational Rehab Evaluation & Intervention   Assessment shows need for Vocational Rehabilitation No      Education: Education Goals: Education classes will be provided on a weekly basis, covering required topics. Participant will state understanding/return demonstration of topics presented.  Learning Barriers/Preferences:     Learning Barriers/Preferences - 04/01/15 1404    Learning Barriers/Preferences   Learning Barriers None   Learning Preferences Video      Education Topics: General Nutrition Guidelines/Fats and Fiber: -Group instruction provided by verbal, written material, models and posters to present the general guidelines for heart healthy nutrition. Gives an explanation and review of dietary fats and fiber.   Controlling Sodium/Reading Food Labels: -Group verbal and written material supporting the discussion of sodium use  in heart healthy nutrition. Review and explanation with models, verbal and written materials for utilization of the food label.   Exercise Physiology & Risk Factors: - Group verbal and written instruction with models to review the exercise physiology of the cardiovascular system and associated critical values. Details cardiovascular disease risk factors and the goals associated with each risk factor.   Aerobic Exercise & Resistance  Training: - Gives group verbal and written discussion on the health impact of inactivity. On the components of aerobic and resistive training programs and the benefits of this training and how to safely progress through these programs.   Flexibility, Balance, General Exercise Guidelines: - Provides group verbal and written instruction on the benefits of flexibility and balance training programs. Provides general exercise guidelines with specific guidelines to those with heart or lung disease. Demonstration and skill practice provided.   Stress Management: - Provides group verbal and written instruction about the health risks of elevated stress, cause of high stress, and healthy ways to reduce stress.   Depression: - Provides group verbal and written instruction on the correlation between heart/lung disease and depressed mood, treatment options, and the stigmas associated with seeking treatment.   Anatomy & Physiology of the Heart: - Group verbal and written instruction and models provide basic cardiac anatomy and physiology, with the coronary electrical and arterial systems. Review of: AMI, Angina, Valve disease, Heart Failure, Cardiac Arrhythmia, Pacemakers, and the ICD.   Cardiac Procedures: - Group verbal and written instruction and models to describe the testing methods done to diagnose heart disease. Reviews the outcomes of the test results. Describes the treatment choices: Medical Management, Angioplasty, or Coronary Bypass Surgery.   Cardiac Medications: - Group verbal and written instruction to review commonly prescribed medications for heart disease. Reviews the medication, class of the drug, and side effects. Includes the steps to properly store meds and maintain the prescription regimen.   Go Sex-Intimacy & Heart Disease, Get SMART - Goal Setting: - Group verbal and written instruction through game format to discuss heart disease and the return to sexual intimacy. Provides  group verbal and written material to discuss and apply goal setting through the application of the S.M.A.R.T. Method.   Other Matters of the Heart: - Provides group verbal, written materials and models to describe Heart Failure, Angina, Valve Disease, and Diabetes in the realm of heart disease. Includes description of the disease process and treatment options available to the cardiac patient.          Cardiac Rehab from 04/10/2015 in [facility] Cardiac Rehab   Date  04/10/15   Educator  DW   Instruction Review Code  2- meets goals/outcomes      Exercise & Equipment Safety: - Individual verbal instruction and demonstration of equipment use and safety with use of the equipment.      Cardiac Rehab from 04/10/2015 in [facility] Cardiac Rehab   Date  04/01/15   Educator  S Mariah Gerstenberger   Instruction Review Code  2- meets goals/outcomes      Infection Prevention: - Provides verbal and written material to individual with discussion of infection control including proper hand washing and proper equipment cleaning during exercise session.      Cardiac Rehab from 04/10/2015 in [facility] Cardiac Rehab   Date  04/01/15   Educator  S Latwan Luchsinger   Instruction Review Code  2- meets goals/outcomes      Falls Prevention: - Provides verbal and written material to individual with discussion of falls prevention and safety.  Cardiac Rehab from 04/10/2015 in [facility] Cardiac Rehab   Date  04/01/15   Educator  S Ivania Teagarden   Instruction Review Code  2- meets goals/outcomes      Diabetes: - Individual verbal and written instruction to review signs/symptoms of diabetes, desired ranges of glucose level fasting, after meals and with exercise. Advice that pre and post exercise glucose checks will be done for 3 sessions at entry of program.      Cardiac Rehab from 04/10/2015 in [facility] Cardiac Rehab   Date  04/01/15   Educator  S Daphine Loch   Instruction Review Code  2- meets goals/outcomes       Knowledge Questionnaire Score:   Personal  Goals and Risk Factors at Admission:     Personal Goals and Risk Factors at Admission - 04/01/15 1410    Personal Goals and Risk Factors on Admission    Weight Management Obesity;Yes   Intervention Learn and follow the exercise and diet guidelines while in the program. Utilize the nutrition and education classes to help gain knowledge of the diet and exercise expectations in the program   Intervention Provide weight management tools through evaluation completed by registered dietician and exercise physiologist.  Establish a goal weight with participant.   Increase Aerobic Exercise and Physical Activity Sedentary;Yes   Intervention While in program, learn and follow the exercise prescription taught. Start at a low level workload and increase workload after able to maintain previous level for 30 minutes. Increase time before increasing intensity.   Intervention Provide exercise education and an individualized exercise prescription that will provide continued progressive overload as per policy without signs/symptoms of physical distress.   Quit Smoking Yes   Number of packs per day 1 pack per week for 40 years   Quit July of 2016   Intervention Utilize your health care professional team to help with smoking cessation while in the program. Your doctor can prescribe medications to aid in cessation. The program can provide information and counseling as needed.   Understand more about Heart/Pulmonary Disease. Yes   Diabetes Yes   Goal Blood glucose control identified by blood glucose values, HgbA1C. Participant verbalizes understanding of the signs/symptoms of hyper/hypo glycemia, proper foot care and importance of medication and nutrition plan for blood glucose control.   Intervention Provide nutrition & aerobic exercise along with prescribed medications to achieve blood glucose in normal ranges: Fasting 65-99 mg/dL   Hypertension Yes   Goal Participant will see blood pressure controlled within the  values of 140/18mm/Hg or within value directed by their physician.   Intervention Provide nutrition & aerobic exercise along with prescribed medications to achieve BP 140/90 or less.   Lipids Yes   Goal Cholesterol controlled with medications as prescribed, with individualized exercise RX and with personalized nutrition plan. Value goals: LDL < 70mg , HDL > 40mg . Participant states understanding of desired cholesterol values and following prescriptions.   Intervention Provide nutrition & aerobic exercise along with prescribed medications to achieve LDL 70mg , HDL >40mg .      Personal Goals and Risk Factors Review:      Goals and Risk Factor Review      04/08/15 1833           Increase Aerobic Exercise and Physical Activity   Goals Progress/Improvement seen  Yes       Comments Quit smoking 3 months ago and has not smoked since. He said he has gained a little bit of weight though since then.  Personal Goals Discharge:     Comments: 30 day review  New to program has attended 2 sessions.

## 2015-04-15 DIAGNOSIS — I5020 Unspecified systolic (congestive) heart failure: Secondary | ICD-10-CM

## 2015-04-15 LAB — GLUCOSE, CAPILLARY: Glucose-Capillary: 83 mg/dL (ref 65–99)

## 2015-04-15 NOTE — Progress Notes (Signed)
Daily Session Note  Patient Details  Name: Scott [name] MRN: [id number] Date of Birth: Mar 24, 1961 Referring Provider:  Charolette Forward, MD  Encounter Date: 04/15/2015  Check In:     Session Check In - 04/15/15 1623    Check-In   Staff Present Lestine Box BS, ACSM EP-C, Exercise Physiologist;Carroll Enterkin RN, BSN;Mary Kellie Shropshire RN   ER physicians immediately available to respond to emergencies See telemetry face sheet for immediately available ER MD   Medication changes reported     No   Fall or balance concerns reported    No   Warm-up and Cool-down Performed on first and last piece of equipment   VAD Patient? No   Pain Assessment   Currently in Pain? No/denies         Goals Met:  Proper associated with RPD/PD & O2 Sat Exercise tolerated well No report of cardiac concerns or symptoms Strength training completed today  Goals Unmet:  Not Applicable  Goals Comments:    Dr. Emily Filbert is Medical Director for [facility] and [facility].

## 2015-04-16 ENCOUNTER — Other Ambulatory Visit: Payer: Self-pay

## 2015-04-16 LAB — BASIC METABOLIC PANEL
BUN: 44 mg/dL — AB (ref 4–21)
CREATININE: 2.5 mg/dL — AB (ref 0.6–1.3)
Glucose: 218 mg/dL
Potassium: 5.1 mmol/L (ref 3.4–5.3)
Sodium: 140 mmol/L (ref 137–147)

## 2015-04-17 ENCOUNTER — Encounter: Payer: Self-pay | Admitting: *Deleted

## 2015-04-17 ENCOUNTER — Other Ambulatory Visit: Payer: Self-pay | Admitting: *Deleted

## 2015-04-17 DIAGNOSIS — Z9861 Coronary angioplasty status: Secondary | ICD-10-CM

## 2015-04-17 DIAGNOSIS — I5020 Unspecified systolic (congestive) heart failure: Secondary | ICD-10-CM

## 2015-04-17 LAB — GLUCOSE, CAPILLARY
Glucose-Capillary: 260 mg/dL — ABNORMAL HIGH (ref 65–99)
Glucose-Capillary: 208 mg/dL — ABNORMAL HIGH (ref 65–99)

## 2015-04-17 NOTE — Progress Notes (Signed)
Cardiac Individual Treatment Plan  Patient Details  Name: Scott [name] MRN: [id number] Date of Birth: 07-28-1961 Referring Provider:  Charolette Forward, MD  Initial Encounter Date:    Visit Diagnosis: Systolic congestive heart failure, unspecified congestive heart failure chronicity  Patient's Home Medications on Admission:  Current outpatient prescriptions:  .  albuterol (PROVENTIL HFA;VENTOLIN HFA) 108 (90 BASE) MCG/ACT inhaler, Inhale 2 puffs into the lungs every 6 (six) hours as needed for wheezing or shortness of breath., Disp: 1 Inhaler, Rfl: 2 .  aspirin EC 81 MG tablet, Take 1 tablet (81 mg total) by mouth daily., Disp: 30 tablet, Rfl: 0 .  atorvastatin (LIPITOR) 80 MG tablet, Take 1 tablet (80 mg total) by mouth daily at 6 PM., Disp: 30 tablet, Rfl: 3 .  carvedilol (COREG) 25 MG tablet, Take 1 tablet (25 mg total) by mouth 2 (two) times daily with a meal., Disp: 30 tablet, Rfl: 1 .  ferrous sulfate 325 (65 FE) MG tablet, Take 1 tablet (325 mg total) by mouth 3 (three) times daily with meals., Disp: 90 tablet, Rfl: 3 .  furosemide (LASIX) 20 MG tablet, Take 1 tablet (20 mg total) by mouth daily., Disp: 30 tablet, Rfl: 0 .  gabapentin (NEURONTIN) 100 MG capsule, Take 1 capsule (100 mg total) by mouth 3 (three) times daily., Disp: 90 capsule, Rfl: 0 .  insulin aspart protamine- aspart (NOVOLOG MIX 70/30) (70-30) 100 UNIT/ML injection, Inject 0.35 mLs (35 Units total) into the skin 2 (two) times daily with a meal., Disp: 20 mL, Rfl: 0 .  isosorbide-hydrALAZINE (BIDIL) 20-37.5 MG per tablet, Take 1 tablet by mouth 3 (three) times daily., Disp: 90 tablet, Rfl: 0 .  loratadine (CLARITIN) 10 MG tablet, Take 1 tablet (10 mg total) by mouth daily as needed for allergies., Disp: 30 tablet, Rfl: 0 .  nitroGLYCERIN (NITROSTAT) 0.4 MG SL tablet, Place 1 tablet (0.4 mg total) under the tongue every 5 (five) minutes x 3 doses as needed for chest pain., Disp: 25 tablet, Rfl: 12 .  omeprazole  (PRILOSEC) 20 MG capsule, Take 1 capsule (20 mg total) by mouth daily., Disp: 30 capsule, Rfl: 0 .  prasugrel (EFFIENT) 10 MG TABS tablet, Take 1 tablet (10 mg total) by mouth daily., Disp: 30 tablet, Rfl: 0 .  spironolactone (ALDACTONE) 25 MG tablet, Take 1 tablet (25 mg total) by mouth daily., Disp: 30 tablet, Rfl: 0  Past Medical History: Past Medical History  Diagnosis Date  . Non-Q wave myocardial infarction     /notes 12/13/2014  . Coronary artery disease     [name] 12/13/2014  . Chronic kidney disease (CKD), stage IV (severe)     [name] 12/13/2014  . Chronic disease anemia     [name] 12/13/2014  . Type II diabetes mellitus   . High cholesterol     [name] 12/13/2014  . PVD (peripheral vascular disease)     [name] 12/13/2014  . Dysrhythmia   . CHF (congestive heart failure)   . GERD (gastroesophageal reflux disease)   . Arthritis     "left arm; right leg" (12/13/2014)  . Depression     Tobacco Use: History  Smoking status  . Current Every Day Smoker -- 0.25 packs/day for 40 years  . Types: Cigarettes  Smokeless tobacco  . Never Used    Labs: Recent Review Flowsheet Data    Labs for ITP Cardiac and Pulmonary Rehab Latest Ref Rng 10/03/2014 12/02/2014 12/13/2014 12/14/2014 01/09/2015   Cholestrol 0 - 200 mg/dL -  146 - 100 -   LDLCALC 0 - 99 mg/dL - 74 - 43 -   HDL >39 mg/dL - 45 - 37(L) -   Trlycerides <150 mg/dL - 134 - 98 -   Hemoglobin A1c 4.0 - 6.0 % 10.0(H) - 7.7(H) - 6.8(H)       Exercise Target Goals:    Exercise Program Goal: Individual exercise prescription set with THRR, safety & activity barriers. Participant demonstrates ability to understand and report RPE using BORG scale, to self-measure pulse accurately, and to acknowledge the importance of the exercise prescription.  Exercise Prescription Goal: Starting with aerobic activity 30 plus minutes a day, 3 days per week for initial exercise prescription. Provide home exercise prescription and guidelines that  participant acknowledges understanding prior to discharge.  Activity Barriers & Risk Stratification:     Activity Barriers & Risk Stratification - 04/01/15 1403    Activity Barriers & Risk Stratification   Activity Barriers Shortness of Breath;Balance Concerns;Arthritis;Other (comment)   Comments Left arm arthrithis   Risk Stratification High      6 Minute Walk:     6 Minute Walk      04/01/15 1504       6 Minute Walk   Phase Initial     Distance 1175 feet     Walk Time 6 minutes     Resting HR 73 bpm     Resting BP 110/70 mmHg     Max Ex. HR 86 bpm     Max Ex. BP 110/70 mmHg     RPE 13     Symptoms No        Initial Exercise Prescription:     Initial Exercise Prescription - 04/01/15 1500    Date of Initial Exercise Prescription   Date 04/01/15   Treadmill   MPH 2   Grade 0   Minutes 10   Bike   Level 0.4   Minutes 10   Recumbant Bike   Level 3   RPM 40   Watts 25   Minutes 10   NuStep   Level 3   Watts 45   Minutes 15   Arm Ergometer   Level 1   Watts 10   Minutes 10   Arm/Foot Ergometer   Level 4   Watts 15   Minutes 10   Cybex   Level 2   RPM 50   Minutes 10   Recumbant Elliptical   Level 1   RPM 40   Watts 10   Minutes 10   Elliptical   Level 1   Speed 3   Minutes 1   REL-XR   Level 3   Watts 45   Minutes 15   Prescription Details   Frequency (times per week) 3   Duration Progress to 30 minutes of continuous aerobic without signs/symptoms of physical distress   Intensity   THRR REST +  30   Ratings of Perceived Exertion 11-15   Progression Continue progressive overload as per policy without signs/symptoms or physical distress.   Resistance Training   Training Prescription Yes   Weight 2   Reps 10-15      Exercise Prescription Changes:     Exercise Prescription Changes      04/08/15 0700           Exercise Review   Progression No  Has not started program yet          Discharge Exercise Prescription (Final  Exercise Prescription Changes):  Exercise Prescription Changes - 04/08/15 0700    Exercise Review   Progression No  Has not started program yet      Nutrition:  Target Goals: Understanding of nutrition guidelines, daily intake of sodium 1500mg , cholesterol 200mg , calories 30% from fat and 7% or less from saturated fats, daily to have 5 or more servings of fruits and vegetables.  Biometrics:     Pre Biometrics - 04/01/15 1508    Pre Biometrics   Height 5\' 10"  (1.778 m)   Weight 241 lb 3.2 oz (109.408 kg)   Waist Circumference 49.25 inches   Hip Circumference 41.25 inches   Waist to Hip Ratio 1.19 %   BMI (Calculated) 34.7       Nutrition Therapy Plan and Nutrition Goals:   Nutrition Discharge: Rate Your Plate Scores:   Nutrition Goals Re-Evaluation:     Nutrition Goals Re-Evaluation      04/17/15 1152           Personal Goal #1 Re-Evaluation   Personal Goal #1 Blood sugar got low at end of class. Discussed possibly eating peanut butter crackers before 3:45pm class since it finishes at 5:"45pm exercise.        Goal Progress Seen Yes          Psychosocial: Target Goals: Acknowledge presence or absence of depression, maximize coping skills, provide positive support system. Participant is able to verbalize types and ability to use techniques and skills needed for reducing stress and depression.  Initial Review & Psychosocial Screening:     Initial Psych Review & Screening - 04/01/15 1407    Initial Review   Current issues with Current Sleep Concerns   Family Dynamics   Concerns No support system   Barriers   Psychosocial barriers to participate in program There are no identifiable barriers or psychosocial needs.;The patient should benefit from training in stress management and relaxation.   Screening Interventions   Interventions Encouraged to exercise;Program counselor consult      Quality of Life Scores:     Quality of Life - 04/03/15 1103     Quality of Life Scores   Health/Function Pre 14.8 %   Socioeconomic Pre 13.69 %   Psych/Spiritual Pre 3.21 %   Family Pre 22.8 %   GLOBAL Pre 13.37 %      PHQ-9:     Recent Review Flowsheet Data    Depression screen PHQ 2/9 04/01/2015   Decreased Interest 1   Down, Depressed, Hopeless 1   PHQ - 2 Score 2   Altered sleeping 3   Tired, decreased energy 3   Change in appetite 3   Feeling bad or failure about yourself  3   Trouble concentrating 3   Moving slowly or fidgety/restless 3   Suicidal thoughts 0   PHQ-9 Score 20   Difficult doing work/chores Not difficult at all      Psychosocial Evaluation and Intervention:   Psychosocial Re-Evaluation:   Vocational Rehabilitation: Provide vocational rehab assistance to qualifying candidates.   Vocational Rehab Evaluation & Intervention:     Vocational Rehab - 04/01/15 1405    Initial Vocational Rehab Evaluation & Intervention   Assessment shows need for Vocational Rehabilitation No      Education: Education Goals: Education classes will be provided on a weekly basis, covering required topics. Participant will state understanding/return demonstration of topics presented.  Learning Barriers/Preferences:     Learning Barriers/Preferences - 04/01/15 1404    Learning Barriers/Preferences   Learning Barriers  None   Learning Preferences Video      Education Topics: General Nutrition Guidelines/Fats and Fiber: -Group instruction provided by verbal, written material, models and posters to present the general guidelines for heart healthy nutrition. Gives an explanation and review of dietary fats and fiber.          Cardiac Rehab from 04/15/2015 in [facility]   Date  04/15/15   Educator  PI   Instruction Review Code  2- meets goals/outcomes      Controlling Sodium/Reading Food Labels: -Group verbal and written material supporting the discussion of sodium use in heart healthy nutrition. Review and explanation  with models, verbal and written materials for utilization of the food label.   Exercise Physiology & Risk Factors: - Group verbal and written instruction with models to review the exercise physiology of the cardiovascular system and associated critical values. Details cardiovascular disease risk factors and the goals associated with each risk factor.   Aerobic Exercise & Resistance Training: - Gives group verbal and written discussion on the health impact of inactivity. On the components of aerobic and resistive training programs and the benefits of this training and how to safely progress through these programs.   Flexibility, Balance, General Exercise Guidelines: - Provides group verbal and written instruction on the benefits of flexibility and balance training programs. Provides general exercise guidelines with specific guidelines to those with heart or lung disease. Demonstration and skill practice provided.   Stress Management: - Provides group verbal and written instruction about the health risks of elevated stress, cause of high stress, and healthy ways to reduce stress.   Depression: - Provides group verbal and written instruction on the correlation between heart/lung disease and depressed mood, treatment options, and the stigmas associated with seeking treatment.   Anatomy & Physiology of the Heart: - Group verbal and written instruction and models provide basic cardiac anatomy and physiology, with the coronary electrical and arterial systems. Review of: AMI, Angina, Valve disease, Heart Failure, Cardiac Arrhythmia, Pacemakers, and the ICD.   Cardiac Procedures: - Group verbal and written instruction and models to describe the testing methods done to diagnose heart disease. Reviews the outcomes of the test results. Describes the treatment choices: Medical Management, Angioplasty, or Coronary Bypass Surgery.   Cardiac Medications: - Group verbal and written instruction to  review commonly prescribed medications for heart disease. Reviews the medication, class of the drug, and side effects. Includes the steps to properly store meds and maintain the prescription regimen.   Go Sex-Intimacy & Heart Disease, Get SMART - Goal Setting: - Group verbal and written instruction through game format to discuss heart disease and the return to sexual intimacy. Provides group verbal and written material to discuss and apply goal setting through the application of the S.M.A.R.T. Method.   Other Matters of the Heart: - Provides group verbal, written materials and models to describe Heart Failure, Angina, Valve Disease, and Diabetes in the realm of heart disease. Includes description of the disease process and treatment options available to the cardiac patient.      Cardiac Rehab from 04/15/2015 in [facility]   Date  04/10/15   Educator  DW   Instruction Review Code  2- meets goals/outcomes      Exercise & Equipment Safety: - Individual verbal instruction and demonstration of equipment use and safety with use of the equipment.      Cardiac Rehab from 04/15/2015 in [facility] Cardiac Rehab   Date  04/01/15  Educator  S BIce   Instruction Review Code  2- meets goals/outcomes      Infection Prevention: - Provides verbal and written material to individual with discussion of infection control including proper hand washing and proper equipment cleaning during exercise session.      Cardiac Rehab from 04/15/2015 in [facility] Card[facility] Rehab   Date  04/01/15   Educator  S Bice   Instruction Review Code  2- meets goals/outcomes      Falls Prevention: - Provides verbal and written material to individual with discussion of falls prevention and safety.      Cardiac Rehab from 04/15/2015 in [facility] Card[facility] Rehab   Date  04/01/15   Educator  S Bice   Instruction Review Code  2- meets goals/outcomes      Diabetes: - Individual verbal and written instruction to review signs/symptoms of  diabetes, desired ranges of glucose level fasting, after meals and with exercise. Advice that pre and post exercise glucose checks will be done for 3 sessions at entry of program.      Cardiac Rehab from 04/15/2015 in [facility] Card[facility] Rehab   Date  04/01/15   Educator  S Bice   Instruction Review Code  2- meets goals/outcomes       Knowledge Questionnaire Score:   Personal Goals and Risk Factors at Admission:     Personal Goals and Risk Factors at Admission - 04/01/15 1410    Personal Goals and Risk Factors on Admission    Weight Management Obesity;Yes   Intervention Learn and follow the exercise and diet guidelines while in the program. Utilize the nutrition and education classes to help gain knowledge of the diet and exercise expectations in the program   Intervention Provide weight management tools through evaluation completed by registered dietician and exercise physiologist.  Establish a goal weight with participant.   Increase Aerobic Exercise and Physical Activity Sedentary;Yes   Intervention While in program, learn and follow the exercise prescription taught. Start at a low level workload and increase workload after able to maintain previous level for 30 minutes. Increase time before increasing intensity.   Intervention Provide exercise education and an individualized exercise prescription that will provide continued progressive overload as per policy without signs/symptoms of physical distress.   Quit Smoking Yes   Number of packs per day 1 pack per week for 40 years   Quit July of 2016   Intervention Utilize your health care professional team to help with smoking cessation while in the program. Your doctor can prescribe medications to aid in cessation. The program can provide information and counseling as needed.   Understand more about Heart/Pulmonary Disease. Yes   Diabetes Yes   Goal Blood glucose control identified by blood glucose values, HgbA1C. Participant verbalizes  understanding of the signs/symptoms of hyper/hypo glycemia, proper foot care and importance of medication and nutrition plan for blood glucose control.   Intervention Provide nutrition & aerobic exercise along with prescribed medications to achieve blood glucose in normal ranges: Fasting 65-99 mg/dL   Hypertension Yes   Goal Participant will see blood pressure controlled within the values of 140/88mm/Hg or within value directed by their physician.   Intervention Provide nutrition & aerobic exercise along with prescribed medications to achieve BP 140/90 or less.   Lipids Yes   Goal Cholesterol controlled with medications as prescribed, with individualized exercise RX and with personalized nutrition plan. Value goals: LDL < 70mg , HDL > 40mg . Participant states understanding of desired cholesterol values and following  prescriptions.   Intervention Provide nutrition & aerobic exercise along with prescribed medications to achieve LDL 70mg , HDL >40mg .      Personal Goals and Risk Factors Review:      Goals and Risk Factor Review      04/08/15 1833 04/17/15 1153         Increase Aerobic Exercise and Physical Activity   Goals Progress/Improvement seen  Yes Yes      Comments Quit smoking 3 months ago and has not smoked since. He said he has gained a little bit of weight though since then.        Diabetes   Goal  Blood glucose control identified by blood glucose values, HgbA1C. Participant verbalizes understanding of the signs/symptoms of hyper/hypo glycemia, proper foot care and importance of medication and nutrition plan for blood glucose control.         Personal Goals Discharge:     Comments: Blood sugar got low at end of class. Discussed possibly eating peanut butter crackers before 3:45pm class since it finishes at 5:45pm exercise.

## 2015-04-17 NOTE — Progress Notes (Signed)
Daily Session Note  Patient Details  Name: Scott [name] MRN: [id number] Date of Birth: [birth year] Referring Provider:  Charolette Forward, MD  Encounter Date: 04/17/2015  Check In:     Session Check In - 04/17/15 1711    Check-In   Staff Present Gerlene Burdock RN, BSN;Verlisa Vara Joya Gaskins RN, BSN;Other  Hessie Knows, BS, Exercise Specialist   ER physicians immediately available to respond to emergencies See telemetry face sheet for immediately available ER MD   Medication changes reported     No   Fall or balance concerns reported    No   Warm-up and Cool-down Performed on first and last piece of equipment   VAD Patient? No   Pain Assessment   Currently in Pain? No/denies         Goals Met:  Exercise tolerated well No report of cardiac concerns or symptoms Strength training completed today  Goals Unmet:  Not Applicable  Goals Comments:    Dr. Emily Filbert is Medical Director for [facility] and [facility].

## 2015-04-18 ENCOUNTER — Ambulatory Visit: Payer: Self-pay

## 2015-04-18 DIAGNOSIS — Z9861 Coronary angioplasty status: Secondary | ICD-10-CM

## 2015-04-18 LAB — GLUCOSE, CAPILLARY
GLUCOSE-CAPILLARY: 147 mg/dL — AB (ref 65–99)
Glucose-Capillary: 109 mg/dL — ABNORMAL HIGH (ref 65–99)

## 2015-04-18 NOTE — Progress Notes (Signed)
Daily Session Note  Patient Details  Name: Scott [name] MRN: [id number] Date of Birth: Jun 26, 1961 Referring Provider:  Charolette Forward, MD  Encounter Date: 04/18/2015  Check In:     Session Check In - 04/18/15 1630    Check-In   Staff Present Lestine Box BS, ACSM EP-C, Exercise Physiologist;Carroll Enterkin RN, BSN;Diane Joya Gaskins RN, BSN   ER physicians immediately available to respond to emergencies See telemetry face sheet for immediately available ER MD   Medication changes reported     No   Fall or balance concerns reported    No   Warm-up and Cool-down Performed on first and last piece of equipment   VAD Patient? No   Pain Assessment   Currently in Pain? No/denies         Goals Met:  Proper associated with RPD/PD & O2 Sat Exercise tolerated well No report of cardiac concerns or symptoms Strength training completed today  Goals Unmet:  Not Applicable  Goals Comments:    Dr. Emily Filbert is Medical Director for [place] and [facility].

## 2015-04-22 DIAGNOSIS — I5020 Unspecified systolic (congestive) heart failure: Secondary | ICD-10-CM

## 2015-04-22 LAB — GLUCOSE, CAPILLARY
Glucose-Capillary: 199 mg/dL — ABNORMAL HIGH (ref 65–99)
Glucose-Capillary: 128 mg/dL — ABNORMAL HIGH (ref 65–99)

## 2015-04-22 NOTE — Progress Notes (Signed)
Daily Session Note  Patient Details  Name: Scott [name] MRN: [id number] Date of Birth: 11-15-1960 Referring Provider:  Charolette Forward, MD  Encounter Date: 04/22/2015  Check In:     Session Check In - 04/22/15 1641    Check-In   Staff Present Heath Lark RN, BSN, CCRP;Carroll Enterkin RN, BSN;Jaleesa Cervi BS, ACSM EP-C, Exercise Physiologist   ER physicians immediately available to respond to emergencies See telemetry face sheet for immediately available ER MD   Medication changes reported     No   Fall or balance concerns reported    No   Warm-up and Cool-down Performed on first and last piece of equipment   VAD Patient? No   Pain Assessment   Currently in Pain? No/denies         Goals Met:  Proper associated with RPD/PD & O2 Sat Exercise tolerated well No report of cardiac concerns or symptoms Strength training completed today  Goals Unmet:  Not Applicable  Goals Comments:    Dr. Emily Filbert is Medical Director for [facility] and [facility].

## 2015-04-24 ENCOUNTER — Encounter (INDEPENDENT_AMBULATORY_CARE_PROVIDER_SITE_OTHER): Payer: Self-pay

## 2015-04-29 DIAGNOSIS — I5020 Unspecified systolic (congestive) heart failure: Secondary | ICD-10-CM

## 2015-04-29 LAB — GLUCOSE, CAPILLARY
GLUCOSE-CAPILLARY: 184 mg/dL — AB (ref 65–99)
GLUCOSE-CAPILLARY: 125 mg/dL — AB (ref 65–99)

## 2015-04-29 NOTE — Progress Notes (Signed)
Daily Session Note  Patient Details  Name: Scott [name] MRN: [id number] Date of Birth: 07/12/1961 Referring Provider:  Charolette Forward, MD  Encounter Date: 04/29/2015  Check In:     Session Check In - 04/29/15 1630    Check-In   Staff Present Lestine Box BS, ACSM EP-C, Exercise Physiologist;Susanne Bice RN, BSN, CCRP;Carroll Enterkin RN, BSN   ER physicians immediately available to respond to emergencies See telemetry face sheet for immediately available ER MD   Medication changes reported     No   Fall or balance concerns reported    No   Warm-up and Cool-down Performed on first and last piece of equipment   VAD Patient? No   Pain Assessment   Currently in Pain? No/denies         Goals Met:  Proper associated with RPD/PD & O2 Sat Exercise tolerated well No report of cardiac concerns or symptoms Strength training completed today  Goals Unmet:  Not Applicable  Goals Comments:    Dr. Emily Filbert is Medical Director for [place] and [facility].

## 2015-05-01 ENCOUNTER — Encounter: Payer: Self-pay | Admitting: *Deleted

## 2015-05-01 DIAGNOSIS — I5020 Unspecified systolic (congestive) heart failure: Secondary | ICD-10-CM

## 2015-05-01 LAB — GLUCOSE, CAPILLARY
GLUCOSE-CAPILLARY: 186 mg/dL — AB (ref 65–99)
GLUCOSE-CAPILLARY: 133 mg/dL — AB (ref 65–99)

## 2015-05-01 NOTE — Progress Notes (Signed)
Daily Session Note  Patient Details  Name: Scott [name] MRN: [id number] Date of Birth: 1961/03/12 Referring Provider:  Charolette Forward, MD  Encounter Date: 05/01/2015  Check In:     Session Check In - 05/01/15 1614    Check-In   Staff Present Diane Joya Gaskins RN, BSN;Lavan Imes Dillard Essex MS, ACSM CEP Exercise Physiologist;Carroll Enterkin RN, BSN   ER physicians immediately available to respond to emergencies See telemetry face sheet for immediately available ER MD   Medication changes reported     No   Fall or balance concerns reported    No   Warm-up and Cool-down Performed on first and last piece of equipment   VAD Patient? No   Pain Assessment   Currently in Pain? No/denies   Multiple Pain Sites No         Goals Met:  Independence with exercise equipment Exercise tolerated well No report of cardiac concerns or symptoms Strength training completed today  Goals Unmet:  Not Applicable  Goals Comments:   Dr. Emily Filbert is Medical Director for [facility] and [facility].

## 2015-05-02 ENCOUNTER — Encounter: Payer: Medicaid Other | Attending: Cardiology

## 2015-05-02 DIAGNOSIS — I5022 Chronic systolic (congestive) heart failure: Secondary | ICD-10-CM | POA: Insufficient documentation

## 2015-05-02 DIAGNOSIS — Z9861 Coronary angioplasty status: Secondary | ICD-10-CM | POA: Insufficient documentation

## 2015-05-02 NOTE — Progress Notes (Signed)
Daily Session Note  Patient Details  Name: Scott [name] MRN: [id number] Date of Birth: 04/04/1961 Referring Provider:  Harwani, Mohan, MD  Encounter Date: 05/02/2015  Check In:     Session Check In - 05/02/15 1612    Check-In   Staff Present Steven Way BS, ACSM EP-C, Exercise Physiologist;Carroll Enterkin RN, BSN;Mary Jo Abernethy RN   ER physicians immediately available to respond to emergencies See telemetry face sheet for immediately available ER MD   Medication changes reported     No   Fall or balance concerns reported    No   Warm-up and Cool-down Performed on first and last piece of equipment   VAD Patient? No   Pain Assessment   Currently in Pain? No/denies         Goals Met:  Proper associated with RPD/PD & O2 Sat Exercise tolerated well No report of cardiac concerns or symptoms Strength training completed today  Goals Unmet:  Not Applicable  Goals Comments:   Dr. Mark Miller is Medical Director for [facility] and [facility]. 

## 2015-05-08 ENCOUNTER — Encounter: Payer: Self-pay | Admitting: *Deleted

## 2015-05-08 DIAGNOSIS — I5020 Unspecified systolic (congestive) heart failure: Secondary | ICD-10-CM

## 2015-05-08 DIAGNOSIS — Z9861 Coronary angioplasty status: Secondary | ICD-10-CM

## 2015-05-08 NOTE — Progress Notes (Signed)
Daily Session Note  Patient Details  Name: JKWON TREPTOW MRN: [id number] Date of Birth: 03-Aug-1961 Referring Provider:  Charolette Forward, MD  Encounter Date: 05/08/2015  Check In:     Session Check In - 05/08/15 1610    Check-In   Staff Present Heath Lark RN, BSN, CCRP;Renee Barnard MS, ACSM CEP Exercise Physiologist;Diane Mariana Arn, BSN   ER physicians immediately available to respond to emergencies See telemetry face sheet for immediately available ER MD   Medication changes reported     No   Fall or balance concerns reported    No   Warm-up and Cool-down Performed on first and last piece of equipment   VAD Patient? No   Pain Assessment   Currently in Pain? No/denies   Multiple Pain Sites No         Goals Met:  Independence with exercise equipment Exercise tolerated well Personal goals reviewed No report of cardiac concerns or symptoms Strength training completed today  Goals Unmet:  Not Applicable  Goals Comments: Doing well with exercise prescription progression.    Dr. Emily Filbert is Medical Director for [place] and [facility].

## 2015-05-08 NOTE — Progress Notes (Signed)
Daily Session Note  Patient Details  Name: Scott [name] MRN: [id number] Date of Birth: 21-May-1961 Referring Provider:  Charolette Forward, MD  Encounter Date: 05/08/2015  Check In:     Session Check In - 05/08/15 1610    Check-In   Staff Present Heath Lark RN, BSN, CCRP;Renee [place] Holiday MS, ACSM CEP Exercise Physiologist;Diane Mariana Arn, BSN   ER physicians immediately available to respond to emergencies See telemetry face sheet for immediately available ER MD   Medication changes reported     No   Fall or balance concerns reported    No   Warm-up and Cool-down Performed on first and last piece of equipment   VAD Patient? No   Pain Assessment   Currently in Pain? No/denies   Multiple Pain Sites No         Goals Met:  Exercise tolerated well No report of cardiac concerns or symptoms Strength training completed today  Goals Unmet:  Not Applicable  Goals Comments:    Dr. Emily Filbert is Medical Director for [place] and [facility].

## 2015-05-09 DIAGNOSIS — I5020 Unspecified systolic (congestive) heart failure: Secondary | ICD-10-CM

## 2015-05-09 NOTE — Progress Notes (Signed)
Daily Session Note  Patient Details  Name: Scott [name] MRN: [id number] Date of Birth: May 18, 1961 Referring Provider:  Charolette Forward, MD  Encounter Date: 05/09/2015  Check In:     Session Check In - 05/09/15 1636    Check-In   Staff Present Nyoka Cowden RN;Diane Joya Gaskins RN, BSN;Jesslyn Viglione BS, ACSM EP-C, Exercise Physiologist   ER physicians immediately available to respond to emergencies See telemetry face sheet for immediately available ER MD   Medication changes reported     No   Fall or balance concerns reported    No   Warm-up and Cool-down Performed on first and last piece of equipment   VAD Patient? No   Pain Assessment   Currently in Pain? No/denies         Goals Met:  Proper associated with RPD/PD & O2 Sat Exercise tolerated well No report of cardiac concerns or symptoms Strength training completed today  Goals Unmet:  Not Applicable  Goals Comments:    Dr. Emily Filbert is Medical Director for [name] and [facility].

## 2015-05-13 ENCOUNTER — Encounter: Payer: Self-pay | Admitting: *Deleted

## 2015-05-13 ENCOUNTER — Encounter: Payer: Medicaid Other | Admitting: *Deleted

## 2015-05-13 DIAGNOSIS — Z9861 Coronary angioplasty status: Secondary | ICD-10-CM

## 2015-05-13 DIAGNOSIS — I5020 Unspecified systolic (congestive) heart failure: Secondary | ICD-10-CM

## 2015-05-13 NOTE — Progress Notes (Signed)
Daily Session Note  Patient Details  Name: Scott [name] MRN: [id number] Date of Birth: [birth year] Referring Provider:  Charolette Forward, MD  Encounter Date: 05/13/2015  Check In:     Session Check In - 05/13/15 1606    Check-In   Staff Present Heath Lark RN, BSN, CCRP;Carroll Enterkin RN, BSN;Steven Way BS, ACSM EP-C, Exercise Physiologist   ER physicians immediately available to respond to emergencies See telemetry face sheet for immediately available ER MD   Medication changes reported     No   Fall or balance concerns reported    No   Warm-up and Cool-down Performed on first and last piece of equipment   VAD Patient? No   Pain Assessment   Currently in Pain? No/denies           Exercise Prescription Changes - 05/13/15 0600    Exercise Review   Progression Yes   Response to Exercise   Blood Pressure (Admit) 128/68 mmHg   Blood Pressure (Exercise) 138/78 mmHg   Blood Pressure (Exit) 122/70 mmHg   Heart Rate (Admit) 79 bpm   Heart Rate (Exercise) 101 bpm   Heart Rate (Exit) 79 bpm   Rating of Perceived Exertion (Exercise) 14   Duration Progress to 30 minutes of continuous aerobic without signs/symptoms of physical distress   Intensity Rest + 30   Progression Continue progressive overload as per policy without signs/symptoms or physical distress.   Resistance Training   Training Prescription Yes   Weight 3   Reps 10-15   Interval Training   Interval Training Yes   Equipment NuStep;Treadmill   Treadmill   MPH 2.2   Grade 0   Minutes 10   NuStep   Level 5   Watts 45   Minutes 20      Goals Met:  Independence with exercise equipment Exercise tolerated well No report of cardiac concerns or symptoms  Goals Unmet:  Not Applicable  Goals Comments: Doing well with exercise prescription progression.    Dr. Emily Filbert is Medical Director for [place] and [facility].

## 2015-05-15 NOTE — Progress Notes (Signed)
Cardiac Individual Treatment Plan  Patient Details  Name: Scott [name] MRN: [id number] Date of Birth: [birth year] Referring Provider:  No ref. provider found  Initial Encounter Date:    Visit Diagnosis: No diagnosis found.  Patient's Home Medications on Admission:  Current outpatient prescriptions:  .  albuterol (PROVENTIL HFA;VENTOLIN HFA) 108 (90 BASE) MCG/ACT inhaler, Inhale 2 puffs into the lungs every 6 (six) hours as needed for wheezing or shortness of breath., Disp: 1 Inhaler, Rfl: 2 .  aspirin EC 81 MG tablet, Take 1 tablet (81 mg total) by mouth daily., Disp: 30 tablet, Rfl: 0 .  atorvastatin (LIPITOR) 80 MG tablet, Take 1 tablet (80 mg total) by mouth daily at 6 PM., Disp: 30 tablet, Rfl: 3 .  carvedilol (COREG) 25 MG tablet, Take 1 tablet (25 mg total) by mouth 2 (two) times daily with a meal., Disp: 30 tablet, Rfl: 1 .  ferrous sulfate 325 (65 FE) MG tablet, Take 1 tablet (325 mg total) by mouth 3 (three) times daily with meals., Disp: 90 tablet, Rfl: 3 .  furosemide (LASIX) 20 MG tablet, Take 1 tablet (20 mg total) by mouth daily., Disp: 30 tablet, Rfl: 0 .  gabapentin (NEURONTIN) 100 MG capsule, Take 1 capsule (100 mg total) by mouth 3 (three) times daily., Disp: 90 capsule, Rfl: 0 .  insulin aspart protamine- aspart (NOVOLOG MIX 70/30) (70-30) 100 UNIT/ML injection, Inject 0.35 mLs (35 Units total) into the skin 2 (two) times daily with a meal., Disp: 20 mL, Rfl: 0 .  isosorbide-hydrALAZINE (BIDIL) 20-37.5 MG per tablet, Take 1 tablet by mouth 3 (three) times daily., Disp: 90 tablet, Rfl: 0 .  loratadine (CLARITIN) 10 MG tablet, Take 1 tablet (10 mg total) by mouth daily as needed for allergies., Disp: 30 tablet, Rfl: 0 .  nitroGLYCERIN (NITROSTAT) 0.4 MG SL tablet, Place 1 tablet (0.4 mg total) under the tongue every 5 (five) minutes x 3 doses as needed for chest pain., Disp: 25 tablet, Rfl: 12 .  omeprazole (PRILOSEC) 20 MG capsule, Take 1 capsule (20 mg total) by mouth  daily., Disp: 30 capsule, Rfl: 0 .  prasugrel (EFFIENT) 10 MG TABS tablet, Take 1 tablet (10 mg total) by mouth daily., Disp: 30 tablet, Rfl: 0 .  spironolactone (ALDACTONE) 25 MG tablet, Take 1 tablet (25 mg total) by mouth daily., Disp: 30 tablet, Rfl: 0  Past Medical History: Past Medical History  Diagnosis Date  . Non-Q wave myocardial infarction     /notes 12/13/2014  . Coronary artery disease     [name] 12/13/2014  . Chronic kidney disease (CKD), stage IV (severe)     [name] 12/13/2014  . Chronic disease anemia     [name] 12/13/2014  . Type II diabetes mellitus   . High cholesterol     [name] 12/13/2014  . PVD (peripheral vascular disease)     [name] 12/13/2014  . Dysrhythmia   . CHF (congestive heart failure)   . GERD (gastroesophageal reflux disease)   . Arthritis     "left arm; right leg" (12/13/2014)  . Depression     Tobacco Use: History  Smoking status  . Current Every Day Smoker -- 0.25 packs/day for 40 years  . Types: Cigarettes  Smokeless tobacco  . Never Used    Labs: Recent Review Flowsheet Data    Labs for ITP Cardiac and Pulmonary Rehab Latest Ref Rng 10/03/2014 12/02/2014 12/13/2014 12/14/2014 01/09/2015   Cholestrol 0 - 200 mg/dL - 146 - 100 -  LDLCALC 0 - 99 mg/dL - 74 - 43 -   HDL >39 mg/dL - 45 - 37(L) -   Trlycerides <150 mg/dL - 134 - 98 -   Hemoglobin A1c 4.0 - 6.0 % 10.0(H) - 7.7(H) - 6.8(H)       Exercise Target Goals:    Exercise Program Goal: Individual exercise prescription set with THRR, safety & activity barriers. Participant demonstrates ability to understand and report RPE using BORG scale, to self-measure pulse accurately, and to acknowledge the importance of the exercise prescription.  Exercise Prescription Goal: Starting with aerobic activity 30 plus minutes a day, 3 days per week for initial exercise prescription. Provide home exercise prescription and guidelines that participant acknowledges understanding prior to  discharge.  Activity Barriers & Risk Stratification:     Activity Barriers & Risk Stratification - 04/01/15 1403    Activity Barriers & Risk Stratification   Activity Barriers Shortness of Breath;Balance Concerns;Arthritis;Other (comment)   Comments Left arm arthrithis   Risk Stratification High      6 Minute Walk:     6 Minute Walk      04/01/15 1504       6 Minute Walk   Phase Initial     Distance 1175 feet     Walk Time 6 minutes     Resting HR 73 bpm     Resting BP 110/70 mmHg     Max Ex. HR 86 bpm     Max Ex. BP 110/70 mmHg     RPE 13     Symptoms No        Initial Exercise Prescription:     Initial Exercise Prescription - 04/01/15 1500    Date of Initial Exercise Prescription   Date 04/01/15   Treadmill   MPH 2   Grade 0   Minutes 10   Bike   Level 0.4   Minutes 10   Recumbant Bike   Level 3   RPM 40   Watts 25   Minutes 10   NuStep   Level 3   Watts 45   Minutes 15   Arm Ergometer   Level 1   Watts 10   Minutes 10   Arm/Foot Ergometer   Level 4   Watts 15   Minutes 10   Cybex   Level 2   RPM 50   Minutes 10   Recumbant Elliptical   Level 1   RPM 40   Watts 10   Minutes 10   Elliptical   Level 1   Speed 3   Minutes 1   REL-XR   Level 3   Watts 45   Minutes 15   Prescription Details   Frequency (times per week) 3   Duration Progress to 30 minutes of continuous aerobic without signs/symptoms of physical distress   Intensity   THRR REST +  30   Ratings of Perceived Exertion 11-15   Progression Continue progressive overload as per policy without signs/symptoms or physical distress.   Resistance Training   Training Prescription Yes   Weight 2   Reps 10-15      Exercise Prescription Changes:     Exercise Prescription Changes      04/08/15 0700 05/13/15 0600         Exercise Review   Progression No  Has not started program yet Yes      Response to Exercise   Blood Pressure (Admit)  128/68 mmHg      Blood  Pressure (Exercise)  138/78 mmHg      Blood Pressure (Exit)  122/70 mmHg      Heart Rate (Admit)  79 bpm      Heart Rate (Exercise)  101 bpm      Heart Rate (Exit)  79 bpm      Rating of Perceived Exertion (Exercise)  14      Duration  Progress to 30 minutes of continuous aerobic without signs/symptoms of physical distress      Intensity  Rest + 30      Progression  Continue progressive overload as per policy without signs/symptoms or physical distress.      Resistance Training   Training Prescription  Yes      Weight  3      Reps  10-15      Interval Training   Interval Training  Yes      Equipment  NuStep;Treadmill      Treadmill   MPH  2.2      Grade  0      Minutes  10      NuStep   Level  5      Watts  45      Minutes  20         Discharge Exercise Prescription (Final Exercise Prescription Changes):     Exercise Prescription Changes - 05/13/15 0600    Exercise Review   Progression Yes   Response to Exercise   Blood Pressure (Admit) 128/68 mmHg   Blood Pressure (Exercise) 138/78 mmHg   Blood Pressure (Exit) 122/70 mmHg   Heart Rate (Admit) 79 bpm   Heart Rate (Exercise) 101 bpm   Heart Rate (Exit) 79 bpm   Rating of Perceived Exertion (Exercise) 14   Duration Progress to 30 minutes of continuous aerobic without signs/symptoms of physical distress   Intensity Rest + 30   Progression Continue progressive overload as per policy without signs/symptoms or physical distress.   Resistance Training   Training Prescription Yes   Weight 3   Reps 10-15   Interval Training   Interval Training Yes   Equipment NuStep;Treadmill   Treadmill   MPH 2.2   Grade 0   Minutes 10   NuStep   Level 5   Watts 45   Minutes 20      Nutrition:  Target Goals: Understanding of nutrition guidelines, daily intake of sodium <1530m, cholesterol <2051m calories 30% from fat and 7% or less from saturated fats, daily to have 5 or more servings of fruits and vegetables.  Biometrics:      Pre Biometrics - 04/01/15 1508    Pre Biometrics   Height '5\' 10"'  (1.778 m)   Weight 241 lb 3.2 oz (109.408 kg)   Waist Circumference 49.25 inches   Hip Circumference 41.25 inches   Waist to Hip Ratio 1.19 %   BMI (Calculated) 34.7       Nutrition Therapy Plan and Nutrition Goals:     Nutrition Therapy & Goals - 04/18/15 1749    Nutrition Therapy   Diet Diabetic renal diet; Stage IV renal disease   Drug/Food Interactions Statins/Certain Fruits   Protein 6 ounces/day   Fruits and Vegetables 5 servings/day   Personal Nutrition Goals   Personal Goal #1 Continue eating 3 meals and 1-2 snacks each day, keep balanced with small portions of protein foods, 3 servings carbs, and a vegetable.   Personal Goal #2 Choose mostly low-postassium vegetables and fruits, no more than one  high-postassium food per day      Nutrition Discharge: Rate Your Plate Scores:     Rate Your Plate - 57/26/20 3559    Rate Your Plate Scores   Pre Score 30   Pre Score % 33 %      Nutrition Goals Re-Evaluation:     Nutrition Goals Re-Evaluation      04/17/15 1152 05/08/15 1807 05/15/15 1239       Personal Goal #1 Re-Evaluation   Personal Goal #1 Blood sugar got low at end of class. Discussed possibly eating peanut butter crackers before 3:45pm class since it finishes at 5:"45pm exercise.   Heberto eats to keep his diabetes in check.      Goal Progress Seen Yes  Yes     Comments  Manly states he is following the nutrition guidelines. He watches his blood sugar levels and tries to eat accordingly.       Personal Goal #2 Re-Evaluation   Personal Goal #2  Choose mostly low-postassium vegetables and fruits, no more than one high-postassium food per day      Goal Progress Seen  Yes Yes     Comments  Malachai is aware of the nedd to limit the high potassium foods.  He is following the guidelines given to him by the RD.          Psychosocial: Target Goals: Acknowledge presence or absence of  depression, maximize coping skills, provide positive support system. Participant is able to verbalize types and ability to use techniques and skills needed for reducing stress and depression.  Initial Review & Psychosocial Screening:     Initial Psych Review & Screening - 04/01/15 1407    Initial Review   Current issues with Current Sleep Concerns   Family Dynamics   Concerns No support system   Barriers   Psychosocial barriers to participate in program There are no identifiable barriers or psychosocial needs.;The patient should benefit from training in stress management and relaxation.   Screening Interventions   Interventions Encouraged to exercise;Program counselor consult      Quality of Life Scores:     Quality of Life - 04/03/15 1103    Quality of Life Scores   Health/Function Pre 14.8 %   Socioeconomic Pre 13.69 %   Psych/Spiritual Pre 3.21 %   Family Pre 22.8 %   GLOBAL Pre 13.37 %      PHQ-9:     Recent Review Flowsheet Data    Depression screen PHQ 2/9 04/01/2015   Decreased Interest 1   Down, Depressed, Hopeless 1   PHQ - 2 Score 2   Altered sleeping 3   Tired, decreased energy 3   Change in appetite 3   Feeling bad or failure about yourself  3   Trouble concentrating 3   Moving slowly or fidgety/restless 3   Suicidal thoughts 0   PHQ-9 Score 20   Difficult doing work/chores Not difficult at all      Psychosocial Evaluation and Intervention:     Psychosocial Evaluation - 04/17/15 1728    Psychosocial Evaluation & Interventions   Comments Counselor met with Mr. Rostron today for initial psychosocial evaluation.  He is a 54 year old who has congestive heart failure.  He has a strong support system with a significant other and an adult son who lives close by.  Mr. Lemmie Evens reports he is sleeping "okay" and his appetite is good.  He admits to a history of depressive symptoms and was on  medication for this until about 6-7 months ago.  He reports his mood has been  more irritable lately as well as other depressive symptoms, with high PHQ-9 score of 20 and low quality of life scores indicating depression reported.  As a result he is encouraged and  is considering asking his Dr. about continuing that medication to address these issues.  Mr. Lemmie Evens has goals to be able to walk better and longer and increase his stamina and strength while in this program.      Continued Psychosocial Services Needed Yes      Psychosocial Re-Evaluation:     Psychosocial Re-Evaluation      05/01/15 1738           Psychosocial Re-Evaluation   Comments Counselor follow up with Mr. Tober in regards to discussing anti-depressants with his PCP.  He reported he tried and they encouraged him to see a psychiatrist.  Counselor provided Mr. Cudd with contact information in order to address this concern.  Counselor will follow up as needed.           Vocational Rehabilitation: Provide vocational rehab assistance to qualifying candidates.   Vocational Rehab Evaluation & Intervention:     Vocational Rehab - 04/01/15 1405    Initial Vocational Rehab Evaluation & Intervention   Assessment shows need for Vocational Rehabilitation No      Education: Education Goals: Education classes will be provided on a weekly basis, covering required topics. Participant will state understanding/return demonstration of topics presented.  Learning Barriers/Preferences:     Learning Barriers/Preferences - 04/01/15 1404    Learning Barriers/Preferences   Learning Barriers None   Learning Preferences Video      Education Topics: General Nutrition Guidelines/Fats and Fiber: -Group instruction provided by verbal, written material, models and posters to present the general guidelines for heart healthy nutrition. Gives an explanation and review of dietary fats and fiber.          Cardiac Rehab from 05/13/2015 in [facility]   Date  04/15/15   Educator  PI   Instruction Review Code  2-  meets goals/outcomes      Controlling Sodium/Reading Food Labels: -Group verbal and written material supporting the discussion of sodium use in heart healthy nutrition. Review and explanation with models, verbal and written materials for utilization of the food label.      Cardiac Rehab from 05/13/2015 in [facility]   Date  04/22/15   Educator  PI   Instruction Review Code  2- meets goals/outcomes      Exercise Physiology & Risk Factors: - Group verbal and written instruction with models to review the exercise physiology of the cardiovascular system and associated critical values. Details cardiovascular disease risk factors and the goals associated with each risk factor.      Cardiac Rehab from 05/13/2015 in [facility] Cardiac Rehab   Date  05/08/15   Educator  RM   Instruction Review Code  2- meets goals/outcomes      Aerobic Exercise & Resistance Training: - Gives group verbal and written discussion on the health impact of inactivity. On the components of aerobic and resistive training programs and the benefits of this training and how to safely progress through these programs.      Cardiac Rehab from 05/13/2015 in [facility] Cardiac Rehab   Date  05/13/15   Educator  SW   Instruction Review Code  2- meets goals/outcomes      Flexibility, Balance, General Exercise Guidelines: -  Provides group verbal and written instruction on the benefits of flexibility and balance training programs. Provides general exercise guidelines with specific guidelines to those with heart or lung disease. Demonstration and skill practice provided.   Stress Management: - Provides group verbal and written instruction about the health risks of elevated stress, cause of high stress, and healthy ways to reduce stress.   Depression: - Provides group verbal and written instruction on the correlation between heart/lung disease and depressed mood, treatment options, and the stigmas associated with seeking  treatment.   Anatomy & Physiology of the Heart: - Group verbal and written instruction and models provide basic cardiac anatomy and physiology, with the coronary electrical and arterial systems. Review of: AMI, Angina, Valve disease, Heart Failure, Cardiac Arrhythmia, Pacemakers, and the ICD.   Cardiac Procedures: - Group verbal and written instruction and models to describe the testing methods done to diagnose heart disease. Reviews the outcomes of the test results. Describes the treatment choices: Medical Management, Angioplasty, or Coronary Bypass Surgery.      Cardiac Rehab from 05/13/2015 in [facility] Card[facility] Re[facility]   Date  04/29/15   Educator  SB   Instruction Review Code  2- meets goals/outcomes      Cardiac Medications: - Group verbal and written instruction to review commonly prescribed medications for heart disease. Reviews the medication, class of the drug, and side effects. Includes the steps to properly store meds and maintain the prescription regimen.      Cardiac Rehab from 05/13/2015 in [facility] Cardiac Rehab   Date  05/01/15   Educator  DW   Instruction Review Code  2- meets goals/outcomes      Go Sex-Intimacy & Heart Disease, Get SMART - Goal Setting: - Group verbal and written instruction through game format to discuss heart disease and the return to sexual intimacy. Provides group verbal and written material to discuss and apply goal setting through the application of the S.M.A.R.T. Method.      Cardiac Rehab from 05/13/2015 in [facility] Cardiac Rehab   Date  04/29/15   Educator  SB   Instruction Review Code  2- meets goals/outcomes      Other Matters of the Heart: - Provides group verbal, written materials and models to describe Heart Failure, Angina, Valve Disease, and Diabetes in the realm of heart disease. Includes description of the disease process and treatment options available to the cardiac patient.      Cardiac Rehab from 05/13/2015 in [facility] Cardiac Rehab   Date   04/10/15   Educator  DW   Instruction Review Code  2- meets goals/outcomes      Exercise & Equipment Safety: - Individual verbal instruction and demonstration of equipment use and safety with use of the equipment.      Cardiac Rehab from 05/13/2015 in [facility] Card[facility]   Date  04/01/15   Educator  S BIce   Instruction Review Code  2- meets goals/outcomes      Infection Prevention: - Provides verbal and written material to individual with discussion of infection control including proper hand washing and proper equipment cleaning during exercise session.      Cardiac Rehab from 05/13/2015 in [facility] Re[facility]   Date  04/01/15   Educator  S Bice   Instruction Review Code  2- meets goals/outcomes      Falls Prevention: - Provides verbal and written material to individual with discussion of falls prevention and safety.      Cardiac Rehab from 05/13/2015 in  [place] [facility]   Date  04/01/15   Educator  S Bice   Instruction Review Code  2- meets goals/outcomes      Diabetes: - Individual verbal and written instruction to review signs/symptoms of diabetes, desired ranges of glucose level fasting, after meals and with exercise. Advice that pre and post exercise glucose checks will be done for 3 sessions at entry of program.      Cardiac Rehab from 05/13/2015 in [facility] Cardiac Rehab   Date  04/01/15   Educator  S Bice   Instruction Review Code  2- meets goals/outcomes       Knowledge Questionnaire Score:   Personal Goals and Risk Factors at Admission:     Personal Goals and Risk Factors at Admission - 04/01/15 1410    Personal Goals and Risk Factors on Admission    Weight Management Obesity;Yes   Intervention Learn and follow the exercise and diet guidelines while in the program. Utilize the nutrition and education classes to help gain knowledge of the diet and exercise expectations in the program   Intervention Provide weight management tools through evaluation completed by  registered dietician and exercise physiologist.  Establish a goal weight with participant.   Increase Aerobic Exercise and Physical Activity Sedentary;Yes   Intervention While in program, learn and follow the exercise prescription taught. Start at a low level workload and increase workload after able to maintain previous level for 30 minutes. Increase time before increasing intensity.   Intervention Provide exercise education and an individualized exercise prescription that will provide continued progressive overload as per policy without signs/symptoms of physical distress.   Quit Smoking Yes   Number of packs per day 1 pack per week for 40 years   Quit July of 2016   Intervention Utilize your health care professional team to help with smoking cessation while in the program. Your doctor can prescribe medications to aid in cessation. The program can provide information and counseling as needed.   Understand more about Heart/Pulmonary Disease. Yes   Diabetes Yes   Goal Blood glucose control identified by blood glucose values, HgbA1C. Participant verbalizes understanding of the signs/symptoms of hyper/hypo glycemia, proper foot care and importance of medication and nutrition plan for blood glucose control.   Intervention Provide nutrition & aerobic exercise along with prescribed medications to achieve blood glucose in normal ranges: Fasting 65-99 mg/dL   Hypertension Yes   Goal Participant will see blood pressure controlled within the values of 140/57m/Hg or within value directed by their physician.   Intervention Provide nutrition & aerobic exercise along with prescribed medications to achieve BP 140/90 or less.   Lipids Yes   Goal Cholesterol controlled with medications as prescribed, with individualized exercise RX and with personalized nutrition plan. Value goals: LDL < 782m HDL > 4061mParticipant states understanding of desired cholesterol values and following prescriptions.   Intervention  Provide nutrition & aerobic exercise along with prescribed medications to achieve LDL <25m75mDL >40mg103m   Personal Goals and Risk Factors Review:      Goals and Risk Factor Review      04/08/15 1833 04/17/15 1153 05/08/15 1810       Increase Aerobic Exercise and Physical Activity   Goals Progress/Improvement seen  Yes Yes Yes     Comments Quit smoking 3 months ago and has not smoked since. He said he has gained a little bit of weight though since then.   ChestPhineasollowing the exercise progression well.  He stated that the exercise he is doing at [place] has helped him to get out to his grapevines and work in the garden.  Before he could barely get outside before he had to stop and rest.     Take Less Medication   Goals Progress/Improvement seen   No     Comments   Johnie is aware he will need time  before any meds are changed.   He is expecting a decrease in his insulin dose, his blood sugar levels are controlled and low at times.     Understand more about Heart/Pulmonary Disease   Goals Progress/Improvement seen    Yes     Comments   He stated that he enjoys the classes.  He even told his cardiologist ,they need to come listen and learn. As he has learned more in class here than anywhere else.     Diabetes   Goal  Blood glucose control identified by blood glucose values, HgbA1C. Participant verbalizes understanding of the signs/symptoms of hyper/hypo glycemia, proper foot care and importance of medication and nutrition plan for blood glucose control. Blood glucose control identified by blood glucose values, HgbA1C. Participant verbalizes understanding of the signs/symptoms of hyper/hypo glycemia, proper foot care and importance of medication and nutrition plan for blood glucose control.     Progress seen towards goals   Yes     Comments   Blood sugar levels were dropping with exercise. Aston had to work on eating prior to class, adn has lowered or held his sliding scale dose on  exercise days, because he was dropping to low and requiring glucose tabs.     Hypertension   Goal   Participant will see blood pressure controlled within the values of 140/56m/Hg or within value directed by their physician.     Progress seen toward goals   Yes     Comments   BP is well controlled.   130/80 range or lower.  HAD been 180/  per CArdyth Gal        Personal Goals Discharge:     Comments: 30 day review. CLondenis glad that Cardiac Rehab has helped him to have more strength to work out in his yard.

## 2015-05-15 NOTE — Addendum Note (Signed)
Addended by: Gerlene Burdock on: 05/15/2015 12:42 PM   Modules accepted: Orders

## 2015-05-16 DIAGNOSIS — Z9861 Coronary angioplasty status: Secondary | ICD-10-CM

## 2015-05-16 NOTE — Progress Notes (Signed)
Daily Session Note  Patient Details  Name: Scott [name] MRN: [id number] Date of Birth: 08-May-1961 Referring Provider:  Charolette Forward, MD  Encounter Date: 05/16/2015  Check In:     Session Check In - 05/16/15 1635    Check-In   Staff Present Lestine Box BS, ACSM EP-C, Exercise Physiologist;Carroll Enterkin RN, BSN;Mary Kellie Shropshire RN   ER physicians immediately available to respond to emergencies See telemetry face sheet for immediately available ER MD   Medication changes reported     No   Fall or balance concerns reported    No   Warm-up and Cool-down Performed on first and last piece of equipment   VAD Patient? No   Pain Assessment   Currently in Pain? No/denies         Goals Met:  Proper associated with RPD/PD & O2 Sat Exercise tolerated well No report of cardiac concerns or symptoms Strength training completed today  Goals Unmet:  Not Applicable  Goals Comments:    Dr. Emily Filbert is Medical Director for [facility].

## 2015-05-20 ENCOUNTER — Encounter: Payer: Medicaid Other | Admitting: *Deleted

## 2015-05-20 DIAGNOSIS — Z9861 Coronary angioplasty status: Secondary | ICD-10-CM

## 2015-05-20 DIAGNOSIS — I5020 Unspecified systolic (congestive) heart failure: Secondary | ICD-10-CM

## 2015-05-20 NOTE — Progress Notes (Signed)
Daily Session Note  Patient Details  Name: Scott [name] MRN: [id number] Date of Birth: September 16, 1960 Referring Provider:  Charolette Forward, MD  Encounter Date: 05/20/2015  Check In:     Session Check In - 05/20/15 1729    Check-In   Staff Present Heath Lark RN, BSN, CCRP;Kelly Hayes BS, ACSM CEP Exercise Physiologist;Steven Way BS, ACSM EP-C, Exercise Physiologist   ER physicians immediately available to respond to emergencies See telemetry face sheet for immediately available ER MD   Medication changes reported     No   Fall or balance concerns reported    No   Warm-up and Cool-down Performed on first and last piece of equipment   VAD Patient? No   Pain Assessment   Currently in Pain? No/denies   Multiple Pain Sites No         Goals Met:  Independence with exercise equipment Exercise tolerated well No report of cardiac concerns or symptoms Strength training completed today  Goals Unmet:  Not Applicable  Goals Comments:    Dr. Emily Filbert is Medical Director for [facility].

## 2015-05-22 ENCOUNTER — Encounter: Payer: Medicaid Other | Admitting: *Deleted

## 2015-05-22 DIAGNOSIS — I5020 Unspecified systolic (congestive) heart failure: Secondary | ICD-10-CM

## 2015-05-22 DIAGNOSIS — Z9861 Coronary angioplasty status: Secondary | ICD-10-CM

## 2015-05-22 NOTE — Progress Notes (Signed)
Cardiac Individual Treatment Plan  Patient Details  Name: Scott [name] MRN: [id number] Date of Birth: Aug 12, 1961 Referring Provider:  Charolette Forward, MD  Initial Encounter Date:    Visit Diagnosis: Systolic congestive heart failure, unspecified congestive heart failure chronicity  S/P PTCA (percutaneous transluminal coronary angioplasty)  Patient's Home Medications on Admission:  Current outpatient prescriptions:  .  albuterol (PROVENTIL HFA;VENTOLIN HFA) 108 (90 BASE) MCG/ACT inhaler, Inhale 2 puffs into the lungs every 6 (six) hours as needed for wheezing or shortness of breath., Disp: 1 Inhaler, Rfl: 2 .  aspirin EC 81 MG tablet, Take 1 tablet (81 mg total) by mouth daily., Disp: 30 tablet, Rfl: 0 .  atorvastatin (LIPITOR) 80 MG tablet, Take 1 tablet (80 mg total) by mouth daily at 6 PM., Disp: 30 tablet, Rfl: 3 .  carvedilol (COREG) 25 MG tablet, Take 1 tablet (25 mg total) by mouth 2 (two) times daily with a meal., Disp: 30 tablet, Rfl: 1 .  ferrous sulfate 325 (65 FE) MG tablet, Take 1 tablet (325 mg total) by mouth 3 (three) times daily with meals., Disp: 90 tablet, Rfl: 3 .  furosemide (LASIX) 20 MG tablet, Take 1 tablet (20 mg total) by mouth daily., Disp: 30 tablet, Rfl: 0 .  gabapentin (NEURONTIN) 100 MG capsule, Take 1 capsule (100 mg total) by mouth 3 (three) times daily., Disp: 90 capsule, Rfl: 0 .  insulin aspart protamine- aspart (NOVOLOG MIX 70/30) (70-30) 100 UNIT/ML injection, Inject 0.35 mLs (35 Units total) into the skin 2 (two) times daily with a meal., Disp: 20 mL, Rfl: 0 .  isosorbide-hydrALAZINE (BIDIL) 20-37.5 MG per tablet, Take 1 tablet by mouth 3 (three) times daily., Disp: 90 tablet, Rfl: 0 .  loratadine (CLARITIN) 10 MG tablet, Take 1 tablet (10 mg total) by mouth daily as needed for allergies., Disp: 30 tablet, Rfl: 0 .  nitroGLYCERIN (NITROSTAT) 0.4 MG SL tablet, Place 1 tablet (0.4 mg total) under the tongue every 5 (five) minutes x 3 doses as needed for  chest pain., Disp: 25 tablet, Rfl: 12 .  omeprazole (PRILOSEC) 20 MG capsule, Take 1 capsule (20 mg total) by mouth daily., Disp: 30 capsule, Rfl: 0 .  prasugrel (EFFIENT) 10 MG TABS tablet, Take 1 tablet (10 mg total) by mouth daily., Disp: 30 tablet, Rfl: 0 .  spironolactone (ALDACTONE) 25 MG tablet, Take 1 tablet (25 mg total) by mouth daily., Disp: 30 tablet, Rfl: 0  Past Medical History: Past Medical History  Diagnosis Date  . Non-Q wave myocardial infarction     /notes 12/13/2014  . Coronary artery disease     [name] 12/13/2014  . Chronic kidney disease (CKD), stage IV (severe)     [name] 12/13/2014  . Chronic disease anemia     [name] 12/13/2014  . Type II diabetes mellitus   . High cholesterol     [name] 12/13/2014  . PVD (peripheral vascular disease)     [name] 12/13/2014  . Dysrhythmia   . CHF (congestive heart failure)   . GERD (gastroesophageal reflux disease)   . Arthritis     "left arm; right leg" (12/13/2014)  . Depression     Tobacco Use: History  Smoking status  . Current Every Day Smoker -- 0.25 packs/day for 40 years  . Types: Cigarettes  Smokeless tobacco  . Never Used    Labs: Recent Review Flowsheet Data    Labs for ITP Cardiac and Pulmonary Rehab Latest Ref Rng 10/03/2014 12/02/2014 12/13/2014 12/14/2014 01/09/2015  Cholestrol 0 - 200 mg/dL - 146 - 100 -   LDLCALC 0 - 99 mg/dL - 74 - 43 -   HDL >39 mg/dL - 45 - 37(L) -   Trlycerides <150 mg/dL - 134 - 98 -   Hemoglobin A1c 4.0 - 6.0 % 10.0(H) - 7.7(H) - 6.8(H)       Exercise Target Goals:    Exercise Program Goal: Individual exercise prescription set with THRR, safety & activity barriers. Participant demonstrates ability to understand and report RPE using BORG scale, to self-measure pulse accurately, and to acknowledge the importance of the exercise prescription.  Exercise Prescription Goal: Starting with aerobic activity 30 plus minutes a day, 3 days per week for initial exercise prescription.  Provide home exercise prescription and guidelines that participant acknowledges understanding prior to discharge.  Activity Barriers & Risk Stratification:     Activity Barriers & Risk Stratification - 04/01/15 1403    Activity Barriers & Risk Stratification   Activity Barriers Shortness of Breath;Balance Concerns;Arthritis;Other (comment)   Comments Left arm arthrithis   Risk Stratification High      6 Minute Walk:     6 Minute Walk      04/01/15 1504       6 Minute Walk   Phase Initial     Distance 1175 feet     Walk Time 6 minutes     Resting HR 73 bpm     Resting BP 110/70 mmHg     Max Ex. HR 86 bpm     Max Ex. BP 110/70 mmHg     RPE 13     Symptoms No        Initial Exercise Prescription:     Initial Exercise Prescription - 04/01/15 1500    Date of Initial Exercise Prescription   Date 04/01/15   Treadmill   MPH 2   Grade 0   Minutes 10   Bike   Level 0.4   Minutes 10   Recumbant Bike   Level 3   RPM 40   Watts 25   Minutes 10   NuStep   Level 3   Watts 45   Minutes 15   Arm Ergometer   Level 1   Watts 10   Minutes 10   Arm/Foot Ergometer   Level 4   Watts 15   Minutes 10   Cybex   Level 2   RPM 50   Minutes 10   Recumbant Elliptical   Level 1   RPM 40   Watts 10   Minutes 10   Elliptical   Level 1   Speed 3   Minutes 1   REL-XR   Level 3   Watts 45   Minutes 15   Prescription Details   Frequency (times per week) 3   Duration Progress to 30 minutes of continuous aerobic without signs/symptoms of physical distress   Intensity   THRR REST +  30   Ratings of Perceived Exertion 11-15   Progression Continue progressive overload as per policy without signs/symptoms or physical distress.   Resistance Training   Training Prescription Yes   Weight 2   Reps 10-15      Exercise Prescription Changes:     Exercise Prescription Changes      04/08/15 0700 05/13/15 0600         Exercise Review   Progression No  Has not  started program yet Yes      Response to Exercise  Blood Pressure (Admit)  128/68 mmHg      Blood Pressure (Exercise)  138/78 mmHg      Blood Pressure (Exit)  122/70 mmHg      Heart Rate (Admit)  79 bpm      Heart Rate (Exercise)  101 bpm      Heart Rate (Exit)  79 bpm      Rating of Perceived Exertion (Exercise)  14      Duration  Progress to 30 minutes of continuous aerobic without signs/symptoms of physical distress      Intensity  Rest + 30      Progression  Continue progressive overload as per policy without signs/symptoms or physical distress.      Resistance Training   Training Prescription  Yes      Weight  3      Reps  10-15      Interval Training   Interval Training  Yes      Equipment  NuStep;Treadmill      Treadmill   MPH  2.2      Grade  0      Minutes  10      NuStep   Level  5      Watts  45      Minutes  20         Discharge Exercise Prescription (Final Exercise Prescription Changes):     Exercise Prescription Changes - 05/13/15 0600    Exercise Review   Progression Yes   Response to Exercise   Blood Pressure (Admit) 128/68 mmHg   Blood Pressure (Exercise) 138/78 mmHg   Blood Pressure (Exit) 122/70 mmHg   Heart Rate (Admit) 79 bpm   Heart Rate (Exercise) 101 bpm   Heart Rate (Exit) 79 bpm   Rating of Perceived Exertion (Exercise) 14   Duration Progress to 30 minutes of continuous aerobic without signs/symptoms of physical distress   Intensity Rest + 30   Progression Continue progressive overload as per policy without signs/symptoms or physical distress.   Resistance Training   Training Prescription Yes   Weight 3   Reps 10-15   Interval Training   Interval Training Yes   Equipment NuStep;Treadmill   Treadmill   MPH 2.2   Grade 0   Minutes 10   NuStep   Level 5   Watts 45   Minutes 20      Nutrition:  Target Goals: Understanding of nutrition guidelines, daily intake of sodium <1542m, cholesterol <2015m calories 30% from fat and 7% or  less from saturated fats, daily to have 5 or more servings of fruits and vegetables.  Biometrics:     Pre Biometrics - 04/01/15 1508    Pre Biometrics   Height '5\' 10"'  (1.778 m)   Weight 241 lb 3.2 oz (109.408 kg)   Waist Circumference 49.25 inches   Hip Circumference 41.25 inches   Waist to Hip Ratio 1.19 %   BMI (Calculated) 34.7       Nutrition Therapy Plan and Nutrition Goals:     Nutrition Therapy & Goals - 04/18/15 1749    Nutrition Therapy   Diet Diabetic renal diet; Stage IV renal disease   Drug/Food Interactions Statins/Certain Fruits   Protein 6 ounces/day   Fruits and Vegetables 5 servings/day   Personal Nutrition Goals   Personal Goal #1 Continue eating 3 meals and 1-2 snacks each day, keep balanced with small portions of protein foods, 3 servings carbs, and a vegetable.   Personal  Goal #2 Choose mostly low-postassium vegetables and fruits, no more than one high-postassium food per day      Nutrition Discharge: Rate Your Plate Scores:     Rate Your Plate - 16/38/46 6599    Rate Your Plate Scores   Pre Score 30   Pre Score % 33 %      Nutrition Goals Re-Evaluation:     Nutrition Goals Re-Evaluation      04/17/15 1152 05/08/15 1807 05/15/15 1239 05/16/15 1636 05/22/15 1823   Personal Goal #1 Re-Evaluation   Personal Goal #1 Blood sugar got low at end of class. Discussed possibly eating peanut butter crackers before 3:45pm class since it finishes at 5:"45pm exercise.   Makell eats to keep his diabetes in check.  Oreoluwa had an MD appt with his Diabetic MD today and has another one on Monday. Chez reports MD will probably up or change his insulin since he has been on insulin for 15 or so years he reports.  Rayyan reported today that he saw his kidney doctor who told him what he is now eating is helping. Dietician  met with Ardyth Gal and instructed him on foods for his kidney problems.    Goal Progress Seen Yes  Yes Yes    Comments  Jshaun states he is  following the nutrition guidelines. He watches his blood sugar levels and tries to eat accordingly.       Personal Goal #2 Re-Evaluation   Personal Goal #2  Choose mostly low-postassium vegetables and fruits, no more than one high-postassium food per day  Hamp said he now knows bettr low potassium foods like if he eats potatoes "to boil the potatoes twice".    Goal Progress Seen  Yes Yes     Comments  Sabien is aware of the nedd to limit the high potassium foods.  He is following the guidelines given to him by the RD.          Psychosocial: Target Goals: Acknowledge presence or absence of depression, maximize coping skills, provide positive support system. Participant is able to verbalize types and ability to use techniques and skills needed for reducing stress and depression.  Initial Review & Psychosocial Screening:     Initial Psych Review & Screening - 04/01/15 1407    Initial Review   Current issues with Current Sleep Concerns   Family Dynamics   Concerns No support system   Barriers   Psychosocial barriers to participate in program There are no identifiable barriers or psychosocial needs.;The patient should benefit from training in stress management and relaxation.   Screening Interventions   Interventions Encouraged to exercise;Program counselor consult      Quality of Life Scores:     Quality of Life - 04/03/15 1103    Quality of Life Scores   Health/Function Pre 14.8 %   Socioeconomic Pre 13.69 %   Psych/Spiritual Pre 3.21 %   Family Pre 22.8 %   GLOBAL Pre 13.37 %      PHQ-9:     Recent Review Flowsheet Data    Depression screen [facility] Hights 2/9 04/01/2015   Decreased Interest 1   Down, Depressed, Hopeless 1   PHQ - 2 Score 2   Altered sleeping 3   Tired, decreased energy 3   Change in appetite 3   Feeling bad or failure about yourself  3   Trouble concentrating 3   Moving slowly or fidgety/restless 3   Suicidal thoughts 0   PHQ-9 Score 20  Difficult doing  work/chores Not difficult at all      Psychosocial Evaluation and Intervention:     Psychosocial Evaluation - 04/17/15 1728    Psychosocial Evaluation & Interventions   Comments Counselor met with Mr. Shevchenko today for initial psychosocial evaluation.  He is a 54 year old who has congestive heart failure.  He has a strong support system with a significant other and an adult son who lives close by.  Mr. Lemmie Evens reports he is sleeping "okay" and his appetite is good.  He admits to a history of depressive symptoms and was on medication for this until about 6-7 months ago.  He reports his mood has been more irritable lately as well as other depressive symptoms, with high PHQ-9 score of 20 and low quality of life scores indicating depression reported.  As a result he is encouraged and  is considering asking his Dr. about continuing that medication to address these issues.  Mr. Lemmie Evens has goals to be able to walk better and longer and increase his stamina and strength while in this program.      Continued Psychosocial Services Needed Yes      Psychosocial Re-Evaluation:     Psychosocial Re-Evaluation      05/01/15 1738           Psychosocial Re-Evaluation   Comments Counselor follow up with Mr. Leib in regards to discussing anti-depressants with his PCP.  He reported he tried and they encouraged him to see a psychiatrist.  Counselor provided Mr. Betzold with contact information in order to address this concern.  Counselor will follow up as needed.           Vocational Rehabilitation: Provide vocational rehab assistance to qualifying candidates.   Vocational Rehab Evaluation & Intervention:     Vocational Rehab - 04/01/15 1405    Initial Vocational Rehab Evaluation & Intervention   Assessment shows need for Vocational Rehabilitation No      Education: Education Goals: Education classes will be provided on a weekly basis, covering required topics. Participant will state understanding/return  demonstration of topics presented.  Learning Barriers/Preferences:     Learning Barriers/Preferences - 04/01/15 1404    Learning Barriers/Preferences   Learning Barriers None   Learning Preferences Video      Education Topics: General Nutrition Guidelines/Fats and Fiber: -Group instruction provided by verbal, written material, models and posters to present the general guidelines for heart healthy nutrition. Gives an explanation and review of dietary fats and fiber.          Cardiac Rehab from 05/22/2015 in [facility] Cardiac Rehab   Date  04/15/15   Educator  PI   Instruction Review Code  2- meets goals/outcomes      Controlling Sodium/Reading Food Labels: -Group verbal and written material supporting the discussion of sodium use in heart healthy nutrition. Review and explanation with models, verbal and written materials for utilization of the food label.      Cardiac Rehab from 05/22/2015 in [facility] Cardiac Rehab   Date  04/22/15   Educator  PI   Instruction Review Code  2- meets goals/outcomes      Exercise Physiology & Risk Factors: - Group verbal and written instruction with models to review the exercise physiology of the cardiovascular system and associated critical values. Details cardiovascular disease risk factors and the goals associated with each risk factor.      Cardiac Rehab from 05/22/2015 in [facility] Cardiac Rehab   Date  05/08/15  Educator  RM   Instruction Review Code  2- meets goals/outcomes      Aerobic Exercise & Resistance Training: - Gives group verbal and written discussion on the health impact of inactivity. On the components of aerobic and resistive training programs and the benefits of this training and how to safely progress through these programs.      Cardiac Rehab from 05/22/2015 in [facility] Cardiac Rehab   Date  05/13/15   Educator  SW   Instruction Review Code  2- meets goals/outcomes      Flexibility, Balance, General Exercise Guidelines: - Provides  group verbal and written instruction on the benefits of flexibility and balance training programs. Provides general exercise guidelines with specific guidelines to those with heart or lung disease. Demonstration and skill practice provided.      Cardiac Rehab from 05/22/2015 in [facility] Cardiac Rehab   Date  05/20/15   Educator  SW   Instruction Review Code  2- meets goals/outcomes      Stress Management: - Provides group verbal and written instruction about the health risks of elevated stress, cause of high stress, and healthy ways to reduce stress.      Cardiac Rehab from 05/22/2015 in [facility] Cardiac Rehab   Date  05/22/15   Educator  Kathreen Cornfield   Instruction Review Code  2- meets goals/outcomes      Depression: - Provides group verbal and written instruction on the correlation between heart/lung disease and depressed mood, treatment options, and the stigmas associated with seeking treatment.   Anatomy & Physiology of the Heart: - Group verbal and written instruction and models provide basic cardiac anatomy and physiology, with the coronary electrical and arterial systems. Review of: AMI, Angina, Valve disease, Heart Failure, Cardiac Arrhythmia, Pacemakers, and the ICD.   Cardiac Procedures: - Group verbal and written instruction and models to describe the testing methods done to diagnose heart disease. Reviews the outcomes of the test results. Describes the treatment choices: Medical Management, Angioplasty, or Coronary Bypass Surgery.      Cardiac Rehab from 05/22/2015 in [facility] Cardiac Rehab   Date  04/29/15   Educator  SB   Instruction Review Code  2- meets goals/outcomes      Cardiac Medications: - Group verbal and written instruction to review commonly prescribed medications for heart disease. Reviews the medication, class of the drug, and side effects. Includes the steps to properly store meds and maintain the prescription regimen.      Cardiac Rehab from 05/22/2015 in [facility] Cardiac  Rehab   Date  05/01/15   Educator  DW   Instruction Review Code  2- meets goals/outcomes      Go Sex-Intimacy & Heart Disease, Get SMART - Goal Setting: - Group verbal and written instruction through game format to discuss heart disease and the return to sexual intimacy. Provides group verbal and written material to discuss and apply goal setting through the application of the S.M.A.R.T. Method.      Cardiac Rehab from 05/22/2015 in [facility] Cardiac Rehab   Date  04/29/15   Educator  SB   Instruction Review Code  2- meets goals/outcomes      Other Matters of the Heart: - Provides group verbal, written materials and models to describe Heart Failure, Angina, Valve Disease, and Diabetes in the realm of heart disease. Includes description of the disease process and treatment options available to the cardiac patient.      Cardiac Rehab from 05/22/2015 in [facility] Cardiac Rehab  Date  04/10/15   Educator  DW   Instruction Review Code  2- meets goals/outcomes      Exercise & Equipment Safety: - Individual verbal instruction and demonstration of equipment use and safety with use of the equipment.      Cardiac Rehab from 05/22/2015 in [facility] Cardiac Rehab   Date  04/01/15   Educator  S BIce   Instruction Review Code  2- meets goals/outcomes      Infection Prevention: - Provides verbal and written material to individual with discussion of infection control including proper hand washing and proper equipment cleaning during exercise session.      Cardiac Rehab from 05/22/2015 in [facility] Cardiac Rehab   Date  04/01/15   Educator  S Bice   Instruction Review Code  2- meets goals/outcomes      Falls Prevention: - Provides verbal and written material to individual with discussion of falls prevention and safety.      Cardiac Rehab from 05/22/2015 in [facility] Cardiac Rehab   Date  04/01/15   Educator  S Bice   Instruction Review Code  2- meets goals/outcomes      Diabetes: - Individual verbal and  written instruction to review signs/symptoms of diabetes, desired ranges of glucose level fasting, after meals and with exercise. Advice that pre and post exercise glucose checks will be done for 3 sessions at entry of program.      Cardiac Rehab from 05/22/2015 in [facility] Cardiac Rehab   Date  04/01/15   Educator  S Bice   Instruction Review Code  2- meets goals/outcomes       Knowledge Questionnaire Score:   Personal Goals and Risk Factors at Admission:     Personal Goals and Risk Factors at Admission - 04/01/15 1410    Personal Goals and Risk Factors on Admission    Weight Management Obesity;Yes   Intervention Learn and follow the exercise and diet guidelines while in the program. Utilize the nutrition and education classes to help gain knowledge of the diet and exercise expectations in the program   Intervention Provide weight management tools through evaluation completed by registered dietician and exercise physiologist.  Establish a goal weight with participant.   Increase Aerobic Exercise and Physical Activity Sedentary;Yes   Intervention While in program, learn and follow the exercise prescription taught. Start at a low level workload and increase workload after able to maintain previous level for 30 minutes. Increase time before increasing intensity.   Intervention Provide exercise education and an individualized exercise prescription that will provide continued progressive overload as per policy without signs/symptoms of physical distress.   Quit Smoking Yes   Number of packs per day 1 pack per week for 40 years   Quit July of 2016   Intervention Utilize your health care professional team to help with smoking cessation while in the program. Your doctor can prescribe medications to aid in cessation. The program can provide information and counseling as needed.   Understand more about Heart/Pulmonary Disease. Yes   Diabetes Yes   Goal Blood glucose control identified by blood glucose  values, HgbA1C. Participant verbalizes understanding of the signs/symptoms of hyper/hypo glycemia, proper foot care and importance of medication and nutrition plan for blood glucose control.   Intervention Provide nutrition & aerobic exercise along with prescribed medications to achieve blood glucose in normal ranges: Fasting 65-99 mg/dL   Hypertension Yes   Goal Participant will see blood pressure controlled within the values of 140/67m/Hg or within  value directed by their physician.   Intervention Provide nutrition & aerobic exercise along with prescribed medications to achieve BP 140/90 or less.   Lipids Yes   Goal Cholesterol controlled with medications as prescribed, with individualized exercise RX and with personalized nutrition plan. Value goals: LDL < 66m, HDL > 433m Participant states understanding of desired cholesterol values and following prescriptions.   Intervention Provide nutrition & aerobic exercise along with prescribed medications to achieve LDL <7060mHDL >24m48m    Personal Goals and Risk Factors Review:      Goals and Risk Factor Review      04/08/15 1833 04/17/15 1153 05/08/15 1810 05/16/15 1644 05/22/15 1825   Increase Aerobic Exercise and Physical Activity   Goals Progress/Improvement seen  Yes Yes Yes Yes Yes   Comments Quit smoking 3 months ago and has not smoked since. He said he has gained a little bit of weight though since then.   ChesKamaufollowing the exercise progression well. He stated that the exercise he is doing at [facility] helped him to get out to his grapevines and work in the garden.  Before he could barely get outside before he had to stop and rest. Legs hurt 5/5 today with peripheral neuropathy so had to stop on treadmill and take a rest but overall is doing better.  ChesRavindrates that he is doing so much better and feels that the treadmill is helping.    Take Less Medication   Goals Progress/Improvement seen   No  No   Comments    ChesDaltynaware he will need time  before any meds are changed.   He is expecting a decrease in his insulin dose, his blood sugar levels are controlled and low at times.  ChesJobyorts no change recently    Understand more about Heart/Pulmonary Disease   Goals Progress/Improvement seen    Yes  Yes   Comments   He stated that he enjoys the classes.  He even told his cardiologist ,they need to come listen and learn. As he has learned more in class here than anywhere else.  ChesDevante very engaged in today's class on Stress and heart disease.    Diabetes   Goal  Blood glucose control identified by blood glucose values, HgbA1C. Participant verbalizes understanding of the signs/symptoms of hyper/hypo glycemia, proper foot care and importance of medication and nutrition plan for blood glucose control. Blood glucose control identified by blood glucose values, HgbA1C. Participant verbalizes understanding of the signs/symptoms of hyper/hypo glycemia, proper foot care and importance of medication and nutrition plan for blood glucose control.     Progress seen towards goals   Yes     Comments   Blood sugar levels were dropping with exercise. Ches[place]rick to work on eating prior to class, adn has lowered or held his sliding scale dose on exercise days, because he was dropping to low and requiring glucose tabs.     Hypertension   Goal   Participant will see blood pressure controlled within the values of 140/90mm72mor within value directed by their physician.     Progress seen toward goals   Yes  Yes   Comments   BP is well controlled.   130/80 range or lower.  HAD been 180/  per ChestArdyth Galester's blood pressures have been stable.       Personal Goals Discharge (Final Personal Goals and Risk Factors Review):      Goals and  Risk Factor Review - 05/22/15 1825    Increase Aerobic Exercise and Physical Activity   Goals Progress/Improvement seen  Yes   Comments Walter states that he is doing so much better  and feels that the treadmill is helping.    Take Less Medication   Goals Progress/Improvement seen No   Comments Tayson reports no change recently    Understand more about Heart/Pulmonary Disease   Goals Progress/Improvement seen  Yes   Comments Shalik was very engaged in today's class on Stress and heart disease.    Hypertension   Progress seen toward goals Yes   Comments Ramona's blood pressures have been stable.        Comments: Zinedine is doing well in Cardiac Rehab and said his kidney doctor told him that his kidneys are doing better since he is eating better since [place] met with the dietician.

## 2015-05-22 NOTE — Progress Notes (Signed)
Daily Session Note  Patient Details  Name: Vipul A Closs MRN: [id number] Date of Birth: 10/08/1960 Referring Provider:  Harwani, Mohan, MD  Encounter Date: 05/22/2015  Check In:     Session Check In - 05/22/15 1608    Check-In   Staff Present Renee MacMillan MS, ACSM CEP Exercise Physiologist;Diane Wright RN, BSN;Carroll Enterkin RN, BSN   ER physicians immediately available to respond to emergencies See telemetry face sheet for immediately available ER MD   Medication changes reported     No   Fall or balance concerns reported    No   Warm-up and Cool-down Performed on first and last piece of equipment   VAD Patient? No   Pain Assessment   Currently in Pain? No/denies   Multiple Pain Sites No         Goals Met:  Exercise tolerated well No report of cardiac concerns or symptoms Strength training completed today  Goals Unmet:  Not Applicable  Goals Comments:    Dr. Mark Miller is Medical Director for [facility] and [facility]. 

## 2015-05-23 ENCOUNTER — Ambulatory Visit: Payer: Self-pay

## 2015-05-23 DIAGNOSIS — I5020 Unspecified systolic (congestive) heart failure: Secondary | ICD-10-CM

## 2015-05-23 DIAGNOSIS — N289 Disorder of kidney and ureter, unspecified: Secondary | ICD-10-CM | POA: Insufficient documentation

## 2015-05-23 NOTE — Progress Notes (Signed)
Daily Session Note  Patient Details  Name: DEMARQUS JOCSON MRN: [id number] Date of Birth: Apr 17, 1961 Referring Provider:  Charolette Forward, MD  Encounter Date: 05/23/2015  Check In:     Session Check In - 05/23/15 1632    Check-In   Staff Present Lestine Box BS, ACSM EP-C, Exercise Physiologist;Carroll Enterkin RN, BSN;Diane Joya Gaskins RN, BSN   ER physicians immediately available to respond to emergencies See telemetry face sheet for immediately available ER MD   Medication changes reported     No   Fall or balance concerns reported    No   Warm-up and Cool-down Performed on first and last piece of equipment   VAD Patient? No   Pain Assessment   Currently in Pain? No/denies         Goals Met:  Proper associated with RPD/PD & O2 Sat Exercise tolerated well No report of cardiac concerns or symptoms Strength training completed today  Goals Unmet:  Not Applicable  Goals Comments:    Dr. Emily Filbert is Medical Director for [place] and [facility].

## 2015-05-24 NOTE — Patient Instructions (Signed)
Discharge Instructions  Patient Details  Name: Scott [name] MRN: [id number] Date of Birth: 08/18/1961 Referring Provider:  Charolette Forward, MD   Number of Visits: REady for 36th final visit.  Reason for Discharge:  Patient reached a stable level of exercise. Patient independent in their exercise.  Smoking History:  History  Smoking status  . Current Every Day Smoker -- 0.25 packs/day for 40 years  . Types: Cigarettes  Smokeless tobacco  . Never Used    Diagnosis:  Systolic congestive heart failure, unspecified congestive heart failure chronicity  Initial Exercise Prescription:     Initial Exercise Prescription - 04/01/15 1500    Date of Initial Exercise Prescription   Date 04/01/15   Treadmill   MPH 2   Grade 0   Minutes 10   Bike   Level 0.4   Minutes 10   Recumbant Bike   Level 3   RPM 40   Watts 25   Minutes 10   NuStep   Level 3   Watts 45   Minutes 15   Arm Ergometer   Level 1   Watts 10   Minutes 10   Arm/Foot Ergometer   Level 4   Watts 15   Minutes 10   Cybex   Level 2   RPM 50   Minutes 10   Recumbant Elliptical   Level 1   RPM 40   Watts 10   Minutes 10   Elliptical   Level 1   Speed 3   Minutes 1   REL-XR   Level 3   Watts 45   Minutes 15   Prescription Details   Frequency (times per week) 3   Duration Progress to 30 minutes of continuous aerobic without signs/symptoms of physical distress   Intensity   THRR REST +  30   Ratings of Perceived Exertion 11-15   Progression Continue progressive overload as per policy without signs/symptoms or physical distress.   Resistance Training   Training Prescription Yes   Weight 2   Reps 10-15      Discharge Exercise Prescription (Final Exercise Prescription Changes):     Exercise Prescription Changes - 05/13/15 0600    Exercise Review   Progression Yes   Response to Exercise   Blood Pressure (Admit) 128/68 mmHg   Blood Pressure (Exercise) 138/78 mmHg   Blood Pressure  (Exit) 122/70 mmHg   Heart Rate (Admit) 79 bpm   Heart Rate (Exercise) 101 bpm   Heart Rate (Exit) 79 bpm   Rating of Perceived Exertion (Exercise) 14   Duration Progress to 30 minutes of continuous aerobic without signs/symptoms of physical distress   Intensity Rest + 30   Progression Continue progressive overload as per policy without signs/symptoms or physical distress.   Resistance Training   Training Prescription Yes   Weight 3   Reps 10-15   Interval Training   Interval Training Yes   Equipment NuStep;Treadmill   Treadmill   MPH 2.2   Grade 0   Minutes 10   NuStep   Level 5   Watts 45   Minutes 20      Functional Capacity:     6 Minute Walk      04/01/15 1504       6 Minute Walk   Phase Initial     Distance 1175 feet     Walk Time 6 minutes     Resting HR 73 bpm     Resting BP 110/70 mmHg  Max Ex. HR 86 bpm     Max Ex. BP 110/70 mmHg     RPE 13     Symptoms No        Quality of Life:     Quality of Life - 05/24/15 1101    Quality of Life Scores   Health/Function Post 25.9 %   Health/Function % Change 75 %   Socioeconomic Post 26.75 %   Socioeconomic % Change 95 %   Psych/Spiritual Post 30 %   Psych/Spiritual % Change 835 %   Family Post 26.7 %   Family % Change 17 %   GLOBAL Post 27.03 %   GLOBAL % Change 102 %      Personal Goals: Goals established at orientation with interventions provided to work toward goal.     Personal Goals and Risk Factors at Admission - 04/01/15 1410    Personal Goals and Risk Factors on Admission    Weight Management Obesity;Yes   Intervention Learn and follow the exercise and diet guidelines while in the program. Utilize the nutrition and education classes to help gain knowledge of the diet and exercise expectations in the program   Intervention Provide weight management tools through evaluation completed by registered dietician and exercise physiologist.  Establish a goal weight with participant.   Increase  Aerobic Exercise and Physical Activity Sedentary;Yes   Intervention While in program, learn and follow the exercise prescription taught. Start at a low level workload and increase workload after able to maintain previous level for 30 minutes. Increase time before increasing intensity.   Intervention Provide exercise education and an individualized exercise prescription that will provide continued progressive overload as per policy without signs/symptoms of physical distress.   Quit Smoking Yes   Number of packs per day 1 pack per week for 40 years   Quit July of 2016   Intervention Utilize your health care professional team to help with smoking cessation while in the program. Your doctor can prescribe medications to aid in cessation. The program can provide information and counseling as needed.   Understand more about Heart/Pulmonary Disease. Yes   Diabetes Yes   Goal Blood glucose control identified by blood glucose values, HgbA1C. Participant verbalizes understanding of the signs/symptoms of hyper/hypo glycemia, proper foot care and importance of medication and nutrition plan for blood glucose control.   Intervention Provide nutrition & aerobic exercise along with prescribed medications to achieve blood glucose in normal ranges: Fasting 65-99 mg/dL   Hypertension Yes   Goal Participant will see blood pressure controlled within the values of 140/45mm/Hg or within value directed by their physician.   Intervention Provide nutrition & aerobic exercise along with prescribed medications to achieve BP 140/90 or less.   Lipids Yes   Goal Cholesterol controlled with medications as prescribed, with individualized exercise RX and with personalized nutrition plan. Value goals: LDL < 70mg , HDL > 40mg . Participant states understanding of desired cholesterol values and following prescriptions.   Intervention Provide nutrition & aerobic exercise along with prescribed medications to achieve LDL 70mg , HDL >40mg .        Personal Goals Discharge:     Goals and Risk Factor Review - 05/22/15 1825    Increase Aerobic Exercise and Physical Activity   Goals Progress/Improvement seen  Yes   Comments Lido states that he is doing so much better and feels that the treadmill is helping.    Take Less Medication   Goals Progress/Improvement seen No   Comments Karrar reports no  change recently    Understand more about Heart/Pulmonary Disease   Goals Progress/Improvement seen  Yes   Comments Avrum was very engaged in today's class on Stress and heart disease.    Hypertension   Progress seen toward goals Yes   Comments Deyon's blood pressures have been stable.       Nutrition & Weight - Outcomes:     Pre Biometrics - 04/01/15 1508    Pre Biometrics   Height 5\' 10"  (1.778 m)   Weight 241 lb 3.2 oz (109.408 kg)   Waist Circumference 49.25 inches   Hip Circumference 41.25 inches   Waist to Hip Ratio 1.19 %   BMI (Calculated) 34.7       Nutrition:     Nutrition Therapy & Goals - 04/18/15 1749    Nutrition Therapy   Diet Diabetic renal diet; Stage IV renal disease   Drug/Food Interactions Statins/Certain Fruits   Protein 6 ounces/day   Fruits and Vegetables 5 servings/day   Personal Nutrition Goals   Personal Goal #1 Continue eating 3 meals and 1-2 snacks each day, keep balanced with small portions of protein foods, 3 servings carbs, and a vegetable.   Personal Goal #2 Choose mostly low-postassium vegetables and fruits, no more than one high-postassium food per day      Nutrition Discharge:     Rate Your Plate - [id number]    Rate Your Plate Scores   Post Score 61   Post Score % 67.7 %  Deionte reports his kidey doctor is glad he is eating what the Cardiac Rehab dietician suggesetd.       Education Questionnaire Score:     Knowledge Questionnaire Score - 05/23/15 1724    Knowledge Questionnaire Score   Post Score 21      Goals reviewed with patient; copy given to  patient.

## 2015-05-29 ENCOUNTER — Encounter: Payer: Medicaid Other | Admitting: *Deleted

## 2015-05-29 DIAGNOSIS — I5020 Unspecified systolic (congestive) heart failure: Secondary | ICD-10-CM

## 2015-05-29 DIAGNOSIS — Z9861 Coronary angioplasty status: Secondary | ICD-10-CM

## 2015-05-29 NOTE — Progress Notes (Signed)
Daily Session Note  Patient Details  Name: Scott [name] MRN: [id number] Date of Birth: 06-18-1961 Referring Provider:  Charolette Forward, MD  Encounter Date: 05/29/2015  Check In:     Session Check In - 05/29/15 1746    Check-In   Staff Present Candiss Norse MS, ACSM CEP Exercise Physiologist;Diane Joya Gaskins RN, BSN;Carroll Enterkin RN, BSN   ER physicians immediately available to respond to emergencies See telemetry face sheet for immediately available ER MD   Medication changes reported     No   Fall or balance concerns reported    No   Warm-up and Cool-down Performed on first and last piece of equipment   VAD Patient? No   Pain Assessment   Currently in Pain? No/denies   Multiple Pain Sites No           Exercise Prescription Changes - 05/29/15 1700    Exercise Review   Progression Yes   Response to Exercise   Blood Pressure (Admit) --   Blood Pressure (Exercise) --   Blood Pressure (Exit) --   Heart Rate (Admit) --   Heart Rate (Exercise) --   Heart Rate (Exit) --   Rating of Perceived Exertion (Exercise) --   Comments Reviewed individualized exercise prescription and made increases per departmental policy. Exercise increases were discussed with the patient and they were able to perform the new work loads without issue (no signs or symptoms). Discussed Hancel adding 2d/wk of exercise outside of class. He is going to exercise at [facility] and is already exerising outside of class. He said he would continue this with the goal of exericsing 5d/wk every week.    Frequency Add 2 additional days to program exercise sessions.   Duration Progress to 50 minutes of aerobic without signs/symptoms of physical distress   Intensity Rest + 30   Progression Continue progressive overload as per policy without signs/symptoms or physical distress.   Resistance Training   Training Prescription Yes   Weight 3   Reps 10-15   Interval Training   Interval Training Yes   Equipment  NuStep;Treadmill   Treadmill   MPH 2.5   Grade 1   Minutes 15   NuStep   Level 5   Watts 45   Minutes 20   Recumbant Elliptical   Level 6  BioStep   Watts 35   Minutes 15      Goals Met:  Independence with exercise equipment Exercise tolerated well Personal goals reviewed No report of cardiac concerns or symptoms Strength training completed today  Goals Unmet:  Not Applicable  Goals Comments: Reviewed individualized exercise prescription and made increases per departmental policy. Exercise increases were discussed with the patient and they were able to perform the new work loads without issue (no signs or symptoms).     Dr. Emily Filbert is Medical Director for [place] and [facility].

## 2015-05-30 ENCOUNTER — Encounter: Payer: Medicaid Other | Admitting: *Deleted

## 2015-05-30 DIAGNOSIS — Z9861 Coronary angioplasty status: Secondary | ICD-10-CM

## 2015-05-30 NOTE — Progress Notes (Signed)
Daily Session Note  Patient Details  Name: Scott [name] MRN: [id number] Date of Birth: 10-Jan-1961 Referring Provider:  Charolette Forward, MD  Encounter Date: 05/30/2015  Check In:     Session Check In - 05/30/15 1705    Check-In   Staff Present Gerlene Burdock RN, BSN;Diane Joya Gaskins RN, BSN   ER physicians immediately available to respond to emergencies See telemetry face sheet for immediately available ER MD   Medication changes reported     No   Fall or balance concerns reported    No   Warm-up and Cool-down Performed on first and last piece of equipment   VAD Patient? No   Pain Assessment   Currently in Pain? No/denies         Goals Met:  Proper associated with RPD/PD & O2 Sat Exercise tolerated well No report of cardiac concerns or symptoms  Goals Unmet:  Not Applicable  Goals Comments: Able to swim full laps now.    Dr. Emily Filbert is Medical Director for [facility] and [facility].

## 2015-05-30 NOTE — Progress Notes (Signed)
Cardiac Individual Treatment Plan  Patient Details  Name: Scott [name] MRN: [id number] Date of Birth: 1961/08/22 Referring Provider:  Charolette Forward, MD  Initial Encounter Date:    Visit Diagnosis: S/P PTCA (percutaneous transluminal coronary angioplasty)  Patient's Home Medications on Admission:  Current outpatient prescriptions:  .  albuterol (PROVENTIL HFA;VENTOLIN HFA) 108 (90 BASE) MCG/ACT inhaler, Inhale 2 puffs into the lungs every 6 (six) hours as needed for wheezing or shortness of breath., Disp: 1 Inhaler, Rfl: 2 .  aspirin EC 81 MG tablet, Take 1 tablet (81 mg total) by mouth daily., Disp: 30 tablet, Rfl: 0 .  atorvastatin (LIPITOR) 80 MG tablet, Take 1 tablet (80 mg total) by mouth daily at 6 PM., Disp: 30 tablet, Rfl: 3 .  carvedilol (COREG) 25 MG tablet, Take 1 tablet (25 mg total) by mouth 2 (two) times daily with a meal., Disp: 30 tablet, Rfl: 1 .  ferrous sulfate 325 (65 FE) MG tablet, Take 1 tablet (325 mg total) by mouth 3 (three) times daily with meals., Disp: 90 tablet, Rfl: 3 .  furosemide (LASIX) 20 MG tablet, Take 1 tablet (20 mg total) by mouth daily., Disp: 30 tablet, Rfl: 0 .  gabapentin (NEURONTIN) 100 MG capsule, Take 1 capsule (100 mg total) by mouth 3 (three) times daily., Disp: 90 capsule, Rfl: 0 .  insulin aspart protamine- aspart (NOVOLOG MIX 70/30) (70-30) 100 UNIT/ML injection, Inject 0.35 mLs (35 Units total) into the skin 2 (two) times daily with a meal., Disp: 20 mL, Rfl: 0 .  isosorbide-hydrALAZINE (BIDIL) 20-37.5 MG per tablet, Take 1 tablet by mouth 3 (three) times daily., Disp: 90 tablet, Rfl: 0 .  loratadine (CLARITIN) 10 MG tablet, Take 1 tablet (10 mg total) by mouth daily as needed for allergies., Disp: 30 tablet, Rfl: 0 .  nitroGLYCERIN (NITROSTAT) 0.4 MG SL tablet, Place 1 tablet (0.4 mg total) under the tongue every 5 (five) minutes x 3 doses as needed for chest pain., Disp: 25 tablet, Rfl: 12 .  omeprazole (PRILOSEC) 20 MG capsule, Take 1  capsule (20 mg total) by mouth daily., Disp: 30 capsule, Rfl: 0 .  prasugrel (EFFIENT) 10 MG TABS tablet, Take 1 tablet (10 mg total) by mouth daily., Disp: 30 tablet, Rfl: 0 .  spironolactone (ALDACTONE) 25 MG tablet, Take 1 tablet (25 mg total) by mouth daily., Disp: 30 tablet, Rfl: 0  Past Medical History: Past Medical History  Diagnosis Date  . Non-Q wave myocardial infarction     /notes 12/13/2014  . Coronary artery disease     [name] 12/13/2014  . Chronic kidney disease (CKD), stage IV (severe)     [name] 12/13/2014  . Chronic disease anemia     [name] 12/13/2014  . Type II diabetes mellitus   . High cholesterol     [name] 12/13/2014  . PVD (peripheral vascular disease)     [name] 12/13/2014  . Dysrhythmia   . CHF (congestive heart failure)   . GERD (gastroesophageal reflux disease)   . Arthritis     "left arm; right leg" (12/13/2014)  . Depression     Tobacco Use: History  Smoking status  . Current Every Day Smoker -- 0.25 packs/day for 40 years  . Types: Cigarettes  Smokeless tobacco  . Never Used    Labs: Recent Review Flowsheet Data    Labs for ITP Cardiac and Pulmonary Rehab Latest Ref Rng 10/03/2014 12/02/2014 12/13/2014 12/14/2014 01/09/2015   Cholestrol 0 - 200 mg/dL - 146 - 100 -  LDLCALC 0 - 99 mg/dL - 74 - 43 -   HDL >39 mg/dL - 45 - 37(L) -   Trlycerides <150 mg/dL - 134 - 98 -   Hemoglobin A1c 4.0 - 6.0 % 10.0(H) - 7.7(H) - 6.8(H)       Exercise Target Goals:    Exercise Program Goal: Individual exercise prescription set with THRR, safety & activity barriers. Participant demonstrates ability to understand and report RPE using BORG scale, to self-measure pulse accurately, and to acknowledge the importance of the exercise prescription.  Exercise Prescription Goal: Starting with aerobic activity 30 plus minutes a day, 3 days per week for initial exercise prescription. Provide home exercise prescription and guidelines that participant acknowledges  understanding prior to discharge.  Activity Barriers & Risk Stratification:     Activity Barriers & Risk Stratification - 04/01/15 1403    Activity Barriers & Risk Stratification   Activity Barriers Shortness of Breath;Balance Concerns;Arthritis;Other (comment)   Comments Left arm arthrithis   Risk Stratification High      6 Minute Walk:     6 Minute Walk      04/01/15 1504       6 Minute Walk   Phase Initial     Distance 1175 feet     Walk Time 6 minutes     Resting HR 73 bpm     Resting BP 110/70 mmHg     Max Ex. HR 86 bpm     Max Ex. BP 110/70 mmHg     RPE 13     Symptoms No        Initial Exercise Prescription:     Initial Exercise Prescription - 04/01/15 1500    Date of Initial Exercise Prescription   Date 04/01/15   Treadmill   MPH 2   Grade 0   Minutes 10   Bike   Level 0.4   Minutes 10   Recumbant Bike   Level 3   RPM 40   Watts 25   Minutes 10   NuStep   Level 3   Watts 45   Minutes 15   Arm Ergometer   Level 1   Watts 10   Minutes 10   Arm/Foot Ergometer   Level 4   Watts 15   Minutes 10   Cybex   Level 2   RPM 50   Minutes 10   Recumbant Elliptical   Level 1   RPM 40   Watts 10   Minutes 10   Elliptical   Level 1   Speed 3   Minutes 1   REL-XR   Level 3   Watts 45   Minutes 15   Prescription Details   Frequency (times per week) 3   Duration Progress to 30 minutes of continuous aerobic without signs/symptoms of physical distress   Intensity   THRR REST +  30   Ratings of Perceived Exertion 11-15   Progression Continue progressive overload as per policy without signs/symptoms or physical distress.   Resistance Training   Training Prescription Yes   Weight 2   Reps 10-15      Exercise Prescription Changes:     Exercise Prescription Changes      04/08/15 0700 05/13/15 0600 05/29/15 1700       Exercise Review   Progression No  Has not started program yet Yes Yes     Response to Exercise   Blood Pressure  (Admit)  128/68 mmHg --  Blood Pressure (Exercise)  138/78 mmHg --     Blood Pressure (Exit)  122/70 mmHg --     Heart Rate (Admit)  79 bpm --     Heart Rate (Exercise)  101 bpm --     Heart Rate (Exit)  79 bpm --     Rating of Perceived Exertion (Exercise)  14 --     Comments   Reviewed individualized exercise prescription and made increases per departmental policy. Exercise increases were discussed with the patient and they were able to perform the new work loads without issue (no signs or symptoms). Discussed Eloise adding 2d/wk of exercise outside of class. He is going to exercise at [facility] and is already exerising outside of class. He said he would continue this with the goal of exericsing 5d/wk every week.      Frequency   Add 2 additional days to program exercise sessions.     Duration  Progress to 30 minutes of continuous aerobic without signs/symptoms of physical distress Progress to 50 minutes of aerobic without signs/symptoms of physical distress     Intensity  Rest + 30 Rest + 30     Progression  Continue progressive overload as per policy without signs/symptoms or physical distress. Continue progressive overload as per policy without signs/symptoms or physical distress.     Resistance Training   Training Prescription  Yes Yes     Weight  3 3     Reps  10-15 10-15     Interval Training   Interval Training  Yes Yes     Equipment  NuStep;Treadmill NuStep;Treadmill     Treadmill   MPH  2.2 2.5     Grade  0 1     Minutes  10 15     NuStep   Level  5 5     Watts  45 45     Minutes  20 20     Recumbant Elliptical   Level   6  BioStep     Watts   35     Minutes   15        Discharge Exercise Prescription (Final Exercise Prescription Changes):     Exercise Prescription Changes - 05/29/15 1700    Exercise Review   Progression Yes   Response to Exercise   Blood Pressure (Admit) --   Blood Pressure (Exercise) --   Blood Pressure (Exit) --   Heart Rate (Admit)  --   Heart Rate (Exercise) --   Heart Rate (Exit) --   Rating of Perceived Exertion (Exercise) --   Comments Reviewed individualized exercise prescription and made increases per departmental policy. Exercise increases were discussed with the patient and they were able to perform the new work loads without issue (no signs or symptoms). Discussed Dal adding 2d/wk of exercise outside of class. He is going to exercise at [facility] and is already exerising outside of class. He said he would continue this with the goal of exericsing 5d/wk every week.    Frequency Add 2 additional days to program exercise sessions.   Duration Progress to 50 minutes of aerobic without signs/symptoms of physical distress   Intensity Rest + 30   Progression Continue progressive overload as per policy without signs/symptoms or physical distress.   Resistance Training   Training Prescription Yes   Weight 3   Reps 10-15   Interval Training   Interval Training Yes   Equipment NuStep;Treadmill   Treadmill   MPH 2.5  Grade 1   Minutes 15   NuStep   Level 5   Watts 45   Minutes 20   Recumbant Elliptical   Level 6  BioStep   Watts 35   Minutes 15      Nutrition:  Target Goals: Understanding of nutrition guidelines, daily intake of sodium <1574m, cholesterol <2057m calories 30% from fat and 7% or less from saturated fats, daily to have 5 or more servings of fruits and vegetables.  Biometrics:     Pre Biometrics - 04/01/15 1508    Pre Biometrics   Height '5\' 10"'  (1.778 m)   Weight 241 lb 3.2 oz (109.408 kg)   Waist Circumference 49.25 inches   Hip Circumference 41.25 inches   Waist to Hip Ratio 1.19 %   BMI (Calculated) 34.7       Nutrition Therapy Plan and Nutrition Goals:     Nutrition Therapy & Goals - 04/18/15 1749    Nutrition Therapy   Diet Diabetic renal diet; Stage IV renal disease   Drug/Food Interactions Statins/Certain Fruits   Protein 6 ounces/day   Fruits and Vegetables 5  servings/day   Personal Nutrition Goals   Personal Goal #1 Continue eating 3 meals and 1-2 snacks each day, keep balanced with small portions of protein foods, 3 servings carbs, and a vegetable.   Personal Goal #2 Choose mostly low-postassium vegetables and fruits, no more than one high-postassium food per day      Nutrition Discharge: Rate Your Plate Scores:     Rate Your Plate - [id number]  Rate Your Plate Scores   Post Score 61   Post Score % 67.7 %  Kacin reports his kidey doctor is glad he is eating what the Cardiac Rehab dietician suggesetd.       Nutrition Goals Re-Evaluation:     Nutrition Goals Re-Evaluation      04/17/15 1152 05/08/15 1807 05/15/15 1239 05/16/15 1636 05/22/15 1823   Personal Goal #1 Re-Evaluation   Personal Goal #1 Blood sugar got low at end of class. Discussed possibly eating peanut butter crackers before 3:45pm class since it finishes at 5:"45pm exercise.   ChEnglishats to keep his diabetes in check.  ChOthelload an MD appt with his Diabetic MD today and has another one on Monday. Darragh reports MD will probably up or change his insulin since he has been on insulin for 15 or so years he reports.  ChFremoneported today that he saw his kidney doctor who told him what he is now eating is helping. Dietician  met with ChArdyth Galnd instructed him on foods for his kidney problems.    Goal Progress Seen Yes  Yes Yes    Comments  ChHarrisontates he is following the nutrition guidelines. He watches his blood sugar levels and tries to eat accordingly.       Personal Goal #2 Re-Evaluation   Personal Goal #2  Choose mostly low-postassium vegetables and fruits, no more than one high-postassium food per day  ChKekoaaid he now knows bettr low potassium foods like if he eats potatoes "to boil the potatoes twice".    Goal Progress Seen  Yes Yes     Comments  ChEhtans aware of the nedd to limit the high potassium foods.  He is following the guidelines given to him  by the RD.          Psychosocial: Target Goals: Acknowledge presence or absence of depression, maximize coping skills, provide positive  support system. Participant is able to verbalize types and ability to use techniques and skills needed for reducing stress and depression.  Initial Review & Psychosocial Screening:     Initial Psych Review & Screening - 04/01/15 1407    Initial Review   Current issues with Current Sleep Concerns   Family Dynamics   Concerns No support system   Barriers   Psychosocial barriers to participate in program There are no identifiable barriers or psychosocial needs.;The patient should benefit from training in stress management and relaxation.   Screening Interventions   Interventions Encouraged to exercise;Program counselor consult      Quality of Life Scores:     Quality of Life - 05/24/15 1101    Quality of Life Scores   Health/Function Post 25.9 %   Health/Function % Change 75 %   Socioeconomic Post 26.75 %   Socioeconomic % Change 95 %   Psych/Spiritual Post 30 %   Psych/Spiritual % Change 835 %   Family Post 26.7 %   Family % Change 17 %   GLOBAL Post 27.03 %   GLOBAL % Change 102 %      PHQ-9:     Recent Review Flowsheet Data    Depression screen [facility] 2/9 05/23/2015 04/01/2015   Decreased Interest 3 1   Down, Depressed, Hopeless 0 1   PHQ - 2 Score 3 2   Altered sleeping 3 3   Tired, decreased energy 1 3   Change in appetite 3 3   Feeling bad or failure about yourself  0 3   Trouble concentrating 2 3   Moving slowly or fidgety/restless 0 3   Suicidal thoughts 0 0   PHQ-9 Score 12 20   Difficult doing work/chores Not difficult at all Not difficult at all      Psychosocial Evaluation and Intervention:     Psychosocial Evaluation - 04/17/15 1728    Psychosocial Evaluation & Interventions   Comments Counselor met with Mr. Decaire today for initial psychosocial evaluation.  He is a 54 year old who has congestive heart failure.  He  has a strong support system with a significant other and an adult son who lives close by.  Mr. Lemmie Evens reports he is sleeping "okay" and his appetite is good.  He admits to a history of depressive symptoms and was on medication for this until about 6-7 months ago.  He reports his mood has been more irritable lately as well as other depressive symptoms, with high PHQ-9 score of 20 and low quality of life scores indicating depression reported.  As a result he is encouraged and  is considering asking his Dr. about continuing that medication to address these issues.  Mr. Lemmie Evens has goals to be able to walk better and longer and increase his stamina and strength while in this program.      Continued Psychosocial Services Needed Yes      Psychosocial Re-Evaluation:     Psychosocial Re-Evaluation      05/01/15 1738           Psychosocial Re-Evaluation   Comments Counselor follow up with Mr. Mitch in regards to discussing anti-depressants with his PCP.  He reported he tried and they encouraged him to see a psychiatrist.  Counselor provided Mr. Sookdeo with contact information in order to address this concern.  Counselor will follow up as needed.           Vocational Rehabilitation: Provide vocational rehab assistance to qualifying candidates.  Vocational Rehab Evaluation & Intervention:     Vocational Rehab - 04/01/15 1405    Initial Vocational Rehab Evaluation & Intervention   Assessment shows need for Vocational Rehabilitation No      Education: Education Goals: Education classes will be provided on a weekly basis, covering required topics. Participant will state understanding/return demonstration of topics presented.  Learning Barriers/Preferences:     Learning Barriers/Preferences - 04/01/15 1404    Learning Barriers/Preferences   Learning Barriers None   Learning Preferences Video      Education Topics: General Nutrition Guidelines/Fats and Fiber: -Group instruction provided by  verbal, written material, models and posters to present the general guidelines for heart healthy nutrition. Gives an explanation and review of dietary fats and fiber.          Cardiac Rehab from 05/22/2015 in [facility] Cardiac Rehab   Date  04/15/15   Educator  PI   Instruction Review Code  2- meets goals/outcomes      Controlling Sodium/Reading Food Labels: -Group verbal and written material supporting the discussion of sodium use in heart healthy nutrition. Review and explanation with models, verbal and written materials for utilization of the food label.      Cardiac Rehab from 05/22/2015 in [facility] Cardiac Rehab   Date  04/22/15   Educator  PI   Instruction Review Code  2- meets goals/outcomes      Exercise Physiology & Risk Factors: - Group verbal and written instruction with models to review the exercise physiology of the cardiovascular system and associated critical values. Details cardiovascular disease risk factors and the goals associated with each risk factor.      Cardiac Rehab from 05/22/2015 in [facility] Cardiac Rehab   Date  05/08/15   Educator  RM   Instruction Review Code  2- meets goals/outcomes      Aerobic Exercise & Resistance Training: - Gives group verbal and written discussion on the health impact of inactivity. On the components of aerobic and resistive training programs and the benefits of this training and how to safely progress through these programs.      Cardiac Rehab from 05/22/2015 in [facility] Cardiac Rehab   Date  05/13/15   Educator  SW   Instruction Review Code  2- meets goals/outcomes      Flexibility, Balance, General Exercise Guidelines: - Provides group verbal and written instruction on the benefits of flexibility and balance training programs. Provides general exercise guidelines with specific guidelines to those with heart or lung disease. Demonstration and skill practice provided.      Cardiac Rehab from 05/22/2015 in [facility] Cardiac Rehab   Date  05/20/15    Educator  SW   Instruction Review Code  2- meets goals/outcomes      Stress Management: - Provides group verbal and written instruction about the health risks of elevated stress, cause of high stress, and healthy ways to reduce stress.      Cardiac Rehab from 05/22/2015 in [facility] Cardiac Rehab   Date  05/22/15   Educator  Kathreen Cornfield   Instruction Review Code  2- meets goals/outcomes      Depression: - Provides group verbal and written instruction on the correlation between heart/lung disease and depressed mood, treatment options, and the stigmas associated with seeking treatment.   Anatomy & Physiology of the Heart: - Group verbal and written instruction and models provide basic cardiac anatomy and physiology, with the coronary electrical and arterial systems. Review of: AMI, Angina, Valve disease, Heart Failure,  Cardiac Arrhythmia, Pacemakers, and the ICD.   Cardiac Procedures: - Group verbal and written instruction and models to describe the testing methods done to diagnose heart disease. Reviews the outcomes of the test results. Describes the treatment choices: Medical Management, Angioplasty, or Coronary Bypass Surgery.      Cardiac Rehab from 05/22/2015 in [facility] Cardiac Rehab   Date  04/29/15   Educator  SB   Instruction Review Code  2- meets goals/outcomes      Cardiac Medications: - Group verbal and written instruction to review commonly prescribed medications for heart disease. Reviews the medication, class of the drug, and side effects. Includes the steps to properly store meds and maintain the prescription regimen.      Cardiac Rehab from 05/22/2015 in [facility] Cardiac Rehab   Date  05/01/15   Educator  DW   Instruction Review Code  2- meets goals/outcomes      Go Sex-Intimacy & Heart Disease, Get SMART - Goal Setting: - Group verbal and written instruction through game format to discuss heart disease and the return to sexual intimacy. Provides group verbal and written  material to discuss and apply goal setting through the application of the S.M.A.R.T. Method.      Cardiac Rehab from 05/22/2015 in [facility] Cardiac Rehab   Date  04/29/15   Educator  SB   Instruction Review Code  2- meets goals/outcomes      Other Matters of the Heart: - Provides group verbal, written materials and models to describe Heart Failure, Angina, Valve Disease, and Diabetes in the realm of heart disease. Includes description of the disease process and treatment options available to the cardiac patient.      Cardiac Rehab from 05/22/2015 in [facility] Cardiac Rehab   Date  04/10/15   Educator  DW   Instruction Review Code  2- meets goals/outcomes      Exercise & Equipment Safety: - Individual verbal instruction and demonstration of equipment use and safety with use of the equipment.      Cardiac Rehab from 05/22/2015 in [facility] Cardiac Rehab   Date  04/01/15   Educator  S BIce   Instruction Review Code  2- meets goals/outcomes      Infection Prevention: - Provides verbal and written material to individual with discussion of infection control including proper hand washing and proper equipment cleaning during exercise session.      Cardiac Rehab from 05/22/2015 in [facility] Cardiac Rehab   Date  04/01/15   Educator  S Bice   Instruction Review Code  2- meets goals/outcomes      Falls Prevention: - Provides verbal and written material to individual with discussion of falls prevention and safety.      Cardiac Rehab from 05/22/2015 in [facility] Cardiac Rehab   Date  04/01/15   Educator  S Bice   Instruction Review Code  2- meets goals/outcomes      Diabetes: - Individual verbal and written instruction to review signs/symptoms of diabetes, desired ranges of glucose level fasting, after meals and with exercise. Advice that pre and post exercise glucose checks will be done for 3 sessions at entry of program.      Cardiac Rehab from 05/22/2015 in [facility] Cardiac Rehab   Date  04/01/15   Educator  S  Bice   Instruction Review Code  2- meets goals/outcomes       Knowledge Questionnaire Score:     Knowledge Questionnaire Score - 05/23/15 1724    Knowledge Questionnaire Score  Post Score 21      Personal Goals and Risk Factors at Admission:     Personal Goals and Risk Factors at Admission - 04/01/15 1410    Personal Goals and Risk Factors on Admission    Weight Management Obesity;Yes   Intervention Learn and follow the exercise and diet guidelines while in the program. Utilize the nutrition and education classes to help gain knowledge of the diet and exercise expectations in the program   Intervention Provide weight management tools through evaluation completed by registered dietician and exercise physiologist.  Establish a goal weight with participant.   Increase Aerobic Exercise and Physical Activity Sedentary;Yes   Intervention While in program, learn and follow the exercise prescription taught. Start at a low level workload and increase workload after able to maintain previous level for 30 minutes. Increase time before increasing intensity.   Intervention Provide exercise education and an individualized exercise prescription that will provide continued progressive overload as per policy without signs/symptoms of physical distress.   Quit Smoking Yes   Number of packs per day 1 pack per week for 40 years   Quit July of 2016   Intervention Utilize your health care professional team to help with smoking cessation while in the program. Your doctor can prescribe medications to aid in cessation. The program can provide information and counseling as needed.   Understand more about Heart/Pulmonary Disease. Yes   Diabetes Yes   Goal Blood glucose control identified by blood glucose values, HgbA1C. Participant verbalizes understanding of the signs/symptoms of hyper/hypo glycemia, proper foot care and importance of medication and nutrition plan for blood glucose control.   Intervention  Provide nutrition & aerobic exercise along with prescribed medications to achieve blood glucose in normal ranges: Fasting 65-99 mg/dL   Hypertension Yes   Goal Participant will see blood pressure controlled within the values of 140/75m/Hg or within value directed by their physician.   Intervention Provide nutrition & aerobic exercise along with prescribed medications to achieve BP 140/90 or less.   Lipids Yes   Goal Cholesterol controlled with medications as prescribed, with individualized exercise RX and with personalized nutrition plan. Value goals: LDL < 739m HDL > 4021mParticipant states understanding of desired cholesterol values and following prescriptions.   Intervention Provide nutrition & aerobic exercise along with prescribed medications to achieve LDL <55m102mDL >40mg89m   Personal Goals and Risk Factors Review:      Goals and Risk Factor Review      04/08/15 1833 04/17/15 1153 05/08/15 1810 05/16/15 1644 05/22/15 1825   Increase Aerobic Exercise and Physical Activity   Goals Progress/Improvement seen  Yes Yes Yes Yes Yes   Comments Quit smoking 3 months ago and has not smoked since. He said he has gained a little bit of weight though since then.   ChestDorwinollowing the exercise progression well. He stated [facility] [place] get out to his grapevines and work in the garden.  Before he could barely get outside before he had to stop and rest. Legs hurt 5/5 today with peripheral neuropathy so had to stop on treadmill and take a rest but overall is doing better.  ChestMakenaes that he is doing so much better and feels that the treadmill is helping.    Take Less Medication   Goals Progress/Improvement seen   No  No   Comments   ChestJaxsynware he will need time  before any meds are changed.   He is expecting a decrease in his insulin dose, his blood sugar levels are controlled and low at times.  Reily reports no change recently     Understand more about Heart/Pulmonary Disease   Goals Progress/Improvement seen    Yes  Yes   Comments   He stated that he enjoys the classes.  He even told his cardiologist ,they need to come listen and learn. As he has learned more in class here than anywhere else.  Javonnie was very engaged in today's class on Stress and heart disease.    Diabetes   Goal  Blood glucose control identified by blood glucose values, HgbA1C. Participant verbalizes understanding of the signs/symptoms of hyper/hypo glycemia, proper foot care and importance of medication and nutrition plan for blood glucose control. Blood glucose control identified by blood glucose values, HgbA1C. Participant verbalizes understanding of the signs/symptoms of hyper/hypo glycemia, proper foot care and importance of medication and nutrition plan for blood glucose control.     Progress seen towards goals   Yes     Comments   Blood sugar levels were dropping with exercise. Koby had to work on eating prior to class, adn has lowered or held his sliding scale dose on exercise days, because he was dropping to low and requiring glucose tabs.     Hypertension   Goal   Participant will see blood pressure controlled within the values of 140/79m/Hg or within value directed by their physician.     Progress seen toward goals   Yes  Yes   Comments   BP is well controlled.   130/80 range or lower.  HAD been 180/  per CArdyth Gal  Josel's blood pressures have been stable.      05/30/15 1705           Increase Aerobic Exercise and Physical Activity   Goals Progress/Improvement seen  Yes       Comments CAnilis now able to swim full laps. Boe swam for 35 minutes last evening after attending Cardiac REhab he reported.           Personal Goals Discharge (Final Personal Goals and Risk Factors Review):      Goals and Risk Factor Review - 05/30/15 1705    Increase Aerobic Exercise and Physical Activity   Goals Progress/Improvement seen  Yes    Comments CChallenis now able to swim full laps. Derk swam for 35 minutes last evening after attending [facility] he reported.        Comments:  Able to swim full laps now.

## 2015-06-03 ENCOUNTER — Encounter: Payer: Medicaid Other | Attending: Cardiology

## 2015-06-03 DIAGNOSIS — I5022 Chronic systolic (congestive) heart failure: Secondary | ICD-10-CM | POA: Insufficient documentation

## 2015-06-03 DIAGNOSIS — Z9861 Coronary angioplasty status: Secondary | ICD-10-CM | POA: Insufficient documentation

## 2015-06-03 NOTE — Progress Notes (Signed)
Daily Session Note  Patient Details  Name: Scott [name] MRN: [id number] Date of Birth: 10-08-1960 Referring Provider:  Charolette Forward, MD  Encounter Date: 06/03/2015  Check In:     Session Check In - 06/03/15 1801    Check-In   Staff Present Lestine Box BS, ACSM EP-C, Exercise Physiologist;Carroll Enterkin RN, BSN;Mary Kellie Shropshire RN   ER physicians immediately available to respond to emergencies See telemetry face sheet for immediately available ER MD   Medication changes reported     No   Fall or balance concerns reported    No   Warm-up and Cool-down Performed on first and last piece of equipment   VAD Patient? No   Pain Assessment   Currently in Pain? No/denies         Goals Met:  Proper associated with RPD/PD & O2 Sat Exercise tolerated well No report of cardiac concerns or symptoms Strength training completed today  Goals Unmet:  Not Applicable  Goals Comments:    Dr. Emily Filbert is Medical Director for [facility].

## 2015-06-05 ENCOUNTER — Institutional Professional Consult (permissible substitution): Payer: Self-pay | Admitting: Specialist

## 2015-06-05 ENCOUNTER — Encounter: Payer: Medicaid Other | Admitting: *Deleted

## 2015-06-05 ENCOUNTER — Encounter: Payer: Self-pay | Admitting: *Deleted

## 2015-06-05 DIAGNOSIS — Z9861 Coronary angioplasty status: Secondary | ICD-10-CM

## 2015-06-05 DIAGNOSIS — I5020 Unspecified systolic (congestive) heart failure: Secondary | ICD-10-CM

## 2015-06-05 NOTE — Progress Notes (Signed)
Daily Session Note  Patient Details  Name: ALIREZA POLLACK MRN: [id number] Date of Birth: [birth year] Referring Provider:  Charolette Forward, MD  Encounter Date: 06/05/2015  Check In:     Session Check In - 06/05/15 1802    Check-In   Staff Present Candiss Norse MS, ACSM CEP Exercise Physiologist;Kajal Scalici Joya Gaskins RN, BSN;Carroll Enterkin RN, BSN   ER physicians immediately available to respond to emergencies See telemetry face sheet for immediately available ER MD   Medication changes reported     Yes   Fall or balance concerns reported    No   Warm-up and Cool-down Performed on first and last piece of equipment   VAD Patient? No   Pain Assessment   Currently in Pain? No/denies   Multiple Pain Sites No           Exercise Prescription Changes - 06/05/15 0800    Exercise Review   Progression Yes   Response to Exercise   Blood Pressure (Admit) 146/86 mmHg   Blood Pressure (Exercise) 138/82 mmHg   Blood Pressure (Exit) 142/84 mmHg   Heart Rate (Admit) 77 bpm   Heart Rate (Exercise) 98 bpm   Heart Rate (Exit) 86 bpm   Rating of Perceived Exertion (Exercise) 14   Symptoms Nonr   Comments Is continuing to exercise 2d/wk in addition to 3d/wk at [facility]. He swims and also is a member at [employer] where he rides the bike and [employer].    Frequency --   Duration Progress to 50 minutes of aerobic without signs/symptoms of physical distress   Intensity Rest + 30   Progression Continue progressive overload as per policy without signs/symptoms or physical distress.   Resistance Training   Training Prescription Yes   Weight 4   Reps 10-15   Interval Training   Interval Training Yes   Equipment NuStep;Treadmill   Treadmill   MPH 2.5   Grade 1   Minutes 15   NuStep   Level 5   Watts 45   Minutes 20   Recumbant Elliptical   Level 6  BioStep   Watts 35   Minutes 15      Goals Met:  Independence with exercise equipment Exercise tolerated well No report of cardiac concerns  or symptoms Strength training completed today  Goals Unmet:  Not Applicable  Goals Comments:    Dr. Emily Filbert is Medical Director for [facility].

## 2015-06-06 DIAGNOSIS — I5020 Unspecified systolic (congestive) heart failure: Secondary | ICD-10-CM

## 2015-06-06 NOTE — Progress Notes (Signed)
Daily Session Note  Patient Details  Name: Scott [name] MRN: [id number] Date of Birth: 10-03-1960 Referring Provider:  Charolette Forward, MD  Encounter Date: 06/06/2015  Check In:     Session Check In - 06/06/15 1626    Check-In   Staff Present Nyoka Cowden RN;Diane Joya Gaskins RN, BSN;Eleyna Brugh BS, ACSM EP-C, Exercise Physiologist   ER physicians immediately available to respond to emergencies See telemetry face sheet for immediately available ER MD   Medication changes reported     No   Fall or balance concerns reported    No   Warm-up and Cool-down Performed on first and last piece of equipment   VAD Patient? No   Pain Assessment   Currently in Pain? No/denies         Goals Met:  Proper associated with RPD/PD & O2 Sat Exercise tolerated well No report of cardiac concerns or symptoms Strength training completed today  Goals Unmet:  Not Applicable  Goals Comments:    Dr. Emily Filbert is Medical Director for [facility].

## 2015-06-10 ENCOUNTER — Encounter: Payer: Self-pay | Admitting: *Deleted

## 2015-06-10 VITALS — Ht 70.0 in | Wt 246.2 lb

## 2015-06-10 DIAGNOSIS — Z9861 Coronary angioplasty status: Secondary | ICD-10-CM

## 2015-06-10 NOTE — Addendum Note (Signed)
Addended by: Gerlene Burdock on: 06/10/2015 07:02 PM   Modules accepted: Orders

## 2015-06-10 NOTE — Patient Instructions (Signed)
Discharge Instructions  Patient Details  Name: Scott [name] MRN: [id number] Date of Birth: Oct 05, 1960 Referring Provider:  Charolette Forward, MD   Number of Visits: 36  Reason for Discharge:  Patient reached a stable level of exercise. Patient independent in their exercise.  Smoking History:  History  Smoking status  . Current Every Day Smoker -- 0.25 packs/day for 40 years  . Types: Cigarettes  Smokeless tobacco  . Never Used    Diagnosis:  S/P PTCA (percutaneous transluminal coronary angioplasty)  Initial Exercise Prescription:     Initial Exercise Prescription - 04/01/15 1500    Date of Initial Exercise Prescription   Date 04/01/15   Treadmill   MPH 2   Grade 0   Minutes 10   Bike   Level 0.4   Minutes 10   Recumbant Bike   Level 3   RPM 40   Watts 25   Minutes 10   NuStep   Level 3   Watts 45   Minutes 15   Arm Ergometer   Level 1   Watts 10   Minutes 10   Arm/Foot Ergometer   Level 4   Watts 15   Minutes 10   Cybex   Level 2   RPM 50   Minutes 10   Recumbant Elliptical   Level 1   RPM 40   Watts 10   Minutes 10   Elliptical   Level 1   Speed 3   Minutes 1   REL-XR   Level 3   Watts 45   Minutes 15   Prescription Details   Frequency (times per week) 3   Duration Progress to 30 minutes of continuous aerobic without signs/symptoms of physical distress   Intensity   THRR REST +  30   Ratings of Perceived Exertion 11-15   Progression Continue progressive overload as per policy without signs/symptoms or physical distress.   Resistance Training   Training Prescription Yes   Weight 2   Reps 10-15      Discharge Exercise Prescription (Final Exercise Prescription Changes):     Exercise Prescription Changes - 06/10/15 0700    Exercise Review   Progression Yes   Response to Exercise   Blood Pressure (Admit) --   Blood Pressure (Exercise) --   Blood Pressure (Exit) --   Heart Rate (Admit) --   Heart Rate (Exercise) --   Heart  Rate (Exit) --   Rating of Perceived Exertion (Exercise) --   Symptoms None   Comments Reviewed individualized exercise prescription and made increases per departmental policy. Exercise increases were discussed with the patient and they were able to perform the new work loads without issue (no signs or symptoms).    Duration Progress to 50 minutes of aerobic without signs/symptoms of physical distress   Intensity Rest + 30   Progression Continue progressive overload as per policy without signs/symptoms or physical distress.   Resistance Training   Training Prescription Yes   Weight 4   Reps 10-15   Interval Training   Interval Training Yes   Equipment NuStep;Treadmill   Treadmill   MPH 2.5   Grade 1   Minutes 25   NuStep   Level 5   Watts 45   Minutes 20   Recumbant Elliptical   Level 6  BioStep   Watts 35   Minutes 15      Functional Capacity:     6 Minute Walk      04/01/15 1504  06/10/15 1630 06/10/15 1733   6 Minute Walk   Phase Initial Discharge Discharge   Distance 1175 feet  1310 feet   Walk Time 6 minutes  6 minutes   Resting HR 73 bpm  88 bpm   Resting BP 110/70 mmHg  124/64 mmHg   Max Ex. HR 86 bpm  85 bpm   Max Ex. BP 110/70 mmHg  144/74 mmHg   RPE 13  14   Symptoms No  No      Quality of Life:     Quality of Life - 05/24/15 1101    Quality of Life Scores   Health/Function Post 25.9 %   Health/Function % Change 75 %   Socioeconomic Post 26.75 %   Socioeconomic % Change 95 %   Psych/Spiritual Post 30 %   Psych/Spiritual % Change 835 %   Family Post 26.7 %   Family % Change 17 %   GLOBAL Post 27.03 %   GLOBAL % Change 102 %      Personal Goals: Goals established at orientation with interventions provided to work toward goal.     Personal Goals and Risk Factors at Admission - 04/01/15 1410    Personal Goals and Risk Factors on Admission    Weight Management Obesity;Yes   Intervention Learn and follow the exercise and diet guidelines  while in the program. Utilize the nutrition and education classes to help gain knowledge of the diet and exercise expectations in the program   Intervention Provide weight management tools through evaluation completed by registered dietician and exercise physiologist.  Establish a goal weight with participant.   Increase Aerobic Exercise and Physical Activity Sedentary;Yes   Intervention While in program, learn and follow the exercise prescription taught. Start at a low level workload and increase workload after able to maintain previous level for 30 minutes. Increase time before increasing intensity.   Intervention Provide exercise education and an individualized exercise prescription that will provide continued progressive overload as per policy without signs/symptoms of physical distress.   Quit Smoking Yes   Number of packs per day 1 pack per week for 40 years   Quit July of 2016   Intervention Utilize your health care professional team to help with smoking cessation while in the program. Your doctor can prescribe medications to aid in cessation. The program can provide information and counseling as needed.   Understand more about Heart/Pulmonary Disease. Yes   Diabetes Yes   Goal Blood glucose control identified by blood glucose values, HgbA1C. Participant verbalizes understanding of the signs/symptoms of hyper/hypo glycemia, proper foot care and importance of medication and nutrition plan for blood glucose control.   Intervention Provide nutrition & aerobic exercise along with prescribed medications to achieve blood glucose in normal ranges: Fasting 65-99 mg/dL   Hypertension Yes   Goal Participant will see blood pressure controlled within the values of 140/35mm/Hg or within value directed by their physician.   Intervention Provide nutrition & aerobic exercise along with prescribed medications to achieve BP 140/90 or less.   Lipids Yes   Goal Cholesterol controlled with medications as  prescribed, with individualized exercise RX and with personalized nutrition plan. Value goals: LDL < 70mg , HDL > 40mg . Participant states understanding of desired cholesterol values and following prescriptions.   Intervention Provide nutrition & aerobic exercise along with prescribed medications to achieve LDL 70mg , HDL >40mg .       Personal Goals Discharge:     Goals and Risk Factor Review - 06/10/15  1858    Increase Aerobic Exercise and Physical Activity   Goals Progress/Improvement seen  Yes   Comments Ready for discharge from Cardiac Rehab today.    Take Less Medication   Goals Progress/Improvement seen No   Understand more about Heart/Pulmonary Disease   Goals Progress/Improvement seen  Yes   Diabetes   Progress seen towards goals Yes   Hypertension   Progress seen toward goals Yes   Abnormal Lipids   Goal --  Still on his cholestrol lowering medicine. Follows up with his primary care MD. etc.       Nutrition & Weight - Outcomes:     Pre Biometrics - 04/01/15 1508    Pre Biometrics   Height 5\' 10"  (1.778 m)   Weight 241 lb 3.2 oz (109.408 kg)   Waist Circumference 49.25 inches   Hip Circumference 41.25 inches   Waist to Hip Ratio 1.19 %   BMI (Calculated) 34.7         Post Biometrics - 06/10/15 1734     Post  Biometrics   Height 5\' 10"  (1.778 m)   Weight 246 lb 3.2 oz (111.676 kg)   Waist Circumference 51 inches   Hip Circumference 43 inches   Waist to Hip Ratio 1.19 %   BMI (Calculated) 35.4      Nutrition:     Nutrition Therapy & Goals - 04/18/15 1749    Nutrition Therapy   Diet Diabetic renal diet; Stage IV renal disease   Drug/Food Interactions Statins/Certain Fruits   Protein 6 ounces/day   Fruits and Vegetables 5 servings/day   Personal Nutrition Goals   Personal Goal #1 Continue eating 3 meals and 1-2 snacks each day, keep balanced with small portions of protein foods, 3 servings carbs, and a vegetable.   Personal Goal #2 Choose mostly  low-postassium vegetables and fruits, no more than one high-postassium food per day      Nutrition Discharge:     Rate Your Plate - [id number]    Rate Your Plate Scores   Post Score 61   Post Score % 67.7 %  Brennin reports his kidey doctor is glad he is eating what the Cardiac Rehab dietician suggesetd.       Education Questionnaire Score:     Knowledge Questionnaire Score - 05/23/15 1724    Knowledge Questionnaire Score   Post Score 21      Goals reviewed with patient; copy given to patient.

## 2015-06-10 NOTE — Progress Notes (Signed)
Discharge Summary  Patient Details  Name: Scott [name] MRN: [id number] Date of Birth: Mar 10, 1961 Referring Provider:  Charolette Forward, MD   Number of Visits: 28  Reason for Discharge:  Patient reached a stable level of exercise. Patient independent in their exercise.  Smoking History:  History  Smoking status  . Current Every Day Smoker -- 0.25 packs/day for 40 years  . Types: Cigarettes  Smokeless tobacco  . Never Used    Diagnosis:  S/P PTCA (percutaneous transluminal coronary angioplasty)  ADL [facility]:   Initial Exercise Prescription:     Initial Exercise Prescription - 04/01/15 1500    Date of Initial Exercise Prescription   Date 04/01/15   Treadmill   MPH 2   Grade 0   Minutes 10   Bike   Level 0.4   Minutes 10   Recumbant Bike   Level 3   RPM 40   Watts 25   Minutes 10   NuStep   Level 3   Watts 45   Minutes 15   Arm Ergometer   Level 1   Watts 10   Minutes 10   Arm/Foot Ergometer   Level 4   Watts 15   Minutes 10   Cybex   Level 2   RPM 50   Minutes 10   Recumbant Elliptical   Level 1   RPM 40   Watts 10   Minutes 10   Elliptical   Level 1   Speed 3   Minutes 1   REL-XR   Level 3   Watts 45   Minutes 15   Prescription Details   Frequency (times per week) 3   Duration Progress to 30 minutes of continuous aerobic without signs/symptoms of physical distress   Intensity   THRR REST +  30   Ratings of Perceived Exertion 11-15   Progression Continue progressive overload as per policy without signs/symptoms or physical distress.   Resistance Training   Training Prescription Yes   Weight 2   Reps 10-15      Discharge Exercise Prescription (Final Exercise Prescription Changes):     Exercise Prescription Changes - 06/10/15 0700    Exercise Review   Progression Yes   Response to Exercise   Blood Pressure (Admit) --   Blood Pressure (Exercise) --   Blood Pressure (Exit) --   Heart Rate (Admit) --   Heart Rate (Exercise) --    Heart Rate (Exit) --   Rating of Perceived Exertion (Exercise) --   Symptoms None   Comments Reviewed individualized exercise prescription and made increases per departmental policy. Exercise increases were discussed with the patient and they were able to perform the new work loads without issue (no signs or symptoms).    Duration Progress to 50 minutes of aerobic without signs/symptoms of physical distress   Intensity Rest + 30   Progression Continue progressive overload as per policy without signs/symptoms or physical distress.   Resistance Training   Training Prescription Yes   Weight 4   Reps 10-15   Interval Training   Interval Training Yes   Equipment NuStep;Treadmill   Treadmill   MPH 2.5   Grade 1   Minutes 25   NuStep   Level 5   Watts 45   Minutes 20   Recumbant Elliptical   Level 6  BioStep   Watts 35   Minutes 15      Functional Capacity:     6 Minute Walk  04/01/15 1504 06/10/15 1630 06/10/15 1733   6 Minute Walk   Phase Initial Discharge Discharge   Distance 1175 feet  1310 feet   Walk Time 6 minutes  6 minutes   Resting HR 73 bpm  88 bpm   Resting BP 110/70 mmHg  124/64 mmHg   Max Ex. HR 86 bpm  85 bpm   Max Ex. BP 110/70 mmHg  144/74 mmHg   RPE 13  14   Symptoms No  No      Psychological, QOL, Others - Outcomes: PHQ 2/9: Depression screen [facility] 2/9 05/23/2015 04/01/2015  Decreased Interest 3 1  Down, Depressed, Hopeless 0 1  PHQ - 2 Score 3 2  Altered sleeping 3 3  Tired, decreased energy 1 3  Change in appetite 3 3  Feeling bad or failure about yourself  0 3  Trouble concentrating 2 3  Moving slowly or fidgety/restless 0 3  Suicidal thoughts 0 0  PHQ-9 Score 12 20  Difficult doing work/chores Not difficult at all Not difficult at all    Quality of Life:     Quality of Life - 05/24/15 1101    Quality of Life Scores   Health/Function Post 25.9 %   Health/Function % Change 75 %   Socioeconomic Post 26.75 %   Socioeconomic %  Change 95 %   Psych/Spiritual Post 30 %   Psych/Spiritual % Change 835 %   Family Post 26.7 %   Family % Change 17 %   GLOBAL Post 27.03 %   GLOBAL % Change 102 %      Personal Goals: Goals established at orientation with interventions provided to work toward goal.     Personal Goals and Risk Factors at Admission - 04/01/15 1410    Personal Goals and Risk Factors on Admission    Weight Management Obesity;Yes   Intervention Learn and follow the exercise and diet guidelines while in the program. Utilize the nutrition and education classes to help gain knowledge of the diet and exercise expectations in the program   Intervention Provide weight management tools through evaluation completed by registered dietician and exercise physiologist.  Establish a goal weight with participant.   Increase Aerobic Exercise and Physical Activity Sedentary;Yes   Intervention While in program, learn and follow the exercise prescription taught. Start at a low level workload and increase workload after able to maintain previous level for 30 minutes. Increase time before increasing intensity.   Intervention Provide exercise education and an individualized exercise prescription that will provide continued progressive overload as per policy without signs/symptoms of physical distress.   Quit Smoking Yes   Number of packs per day 1 pack per week for 40 years   Quit July of 2016   Intervention Utilize your health care professional team to help with smoking cessation while in the program. Your doctor can prescribe medications to aid in cessation. The program can provide information and counseling as needed.   Understand more about Heart/Pulmonary Disease. Yes   Diabetes Yes   Goal Blood glucose control identified by blood glucose values, HgbA1C. Participant verbalizes understanding of the signs/symptoms of hyper/hypo glycemia, proper foot care and importance of medication and nutrition plan for blood glucose control.    Intervention Provide nutrition & aerobic exercise along with prescribed medications to achieve blood glucose in normal ranges: Fasting 65-99 mg/dL   Hypertension Yes   Goal Participant will see blood pressure controlled within the values of 140/65mm/Hg or within value directed by their  physician.   Intervention Provide nutrition & aerobic exercise along with prescribed medications to achieve BP 140/90 or less.   Lipids Yes   Goal Cholesterol controlled with medications as prescribed, with individualized exercise RX and with personalized nutrition plan. Value goals: LDL < 70mg , HDL > 40mg . Participant states understanding of desired cholesterol values and following prescriptions.   Intervention Provide nutrition & aerobic exercise along with prescribed medications to achieve LDL 70mg , HDL >40mg .       Personal Goals Discharge:     Goals and Risk Factor Review      04/08/15 1833 04/17/15 1153 05/08/15 1810 05/16/15 1644 05/22/15 1825   Increase Aerobic Exercise and Physical Activity   Goals Progress/Improvement seen  Yes Yes Yes Yes Yes   Comments Quit smoking 3 months ago and has not smoked since. He said he has gained a little bit of weight though since then.   Zahi is following the exercise progression well. He stated that the exercise he is doing at [place] has helped him to get out to his grapevines and work in the garden.  Before he could barely get outside before he had to stop and rest. Legs hurt 5/5 today with peripheral neuropathy so had to stop on treadmill and take a rest but overall is doing better.  Virge states that he is doing so much better and feels that the treadmill is helping.    Take Less Medication   Goals Progress/Improvement seen   No  No   Comments   Raymund is aware he will need time  before any meds are changed.   He is expecting a decrease in his insulin dose, his blood sugar levels are controlled and low at times.  Charbel reports no change recently     Understand more about Heart/Pulmonary Disease   Goals Progress/Improvement seen    Yes  Yes   Comments   He stated that he enjoys the classes.  He even told his cardiologist ,they need to come listen and learn. As he has learned more in class here than anywhere else.  Amber was very engaged in today's class on Stress and heart disease.    Diabetes   Goal  Blood glucose control identified by blood glucose values, HgbA1C. Participant verbalizes understanding of the signs/symptoms of hyper/hypo glycemia, proper foot care and importance of medication and nutrition plan for blood glucose control. Blood glucose control identified by blood glucose values, HgbA1C. Participant verbalizes understanding of the signs/symptoms of hyper/hypo glycemia, proper foot care and importance of medication and nutrition plan for blood glucose control.     Progress seen towards goals   Yes     Comments   Blood sugar levels were dropping with exercise. Naszir had to work on eating prior to class, adn has lowered or held his sliding scale dose on exercise days, because he was dropping to low and requiring glucose tabs.     Hypertension   Goal   Participant will see blood pressure controlled within the values of 140/64mm/Hg or within value directed by their physician.     Progress seen toward goals   Yes  Yes   Comments   BP is well controlled.   130/80 range or lower.  HAD been 180/  per Ardyth Gal.  Abed's blood pressures have been stable.      05/30/15 1705 06/05/15 1916 06/10/15 1858       Increase Aerobic Exercise and Physical Activity   Goals Progress/Improvement seen  Yes  Yes Yes     Comments Jemell is now able to swim full laps. Abdoulie swam for 35 minutes last evening after attending Cardiac REhab he reported.  Ardyth Gal reported today that he has seen an improvement in his walking, as he can walk farther.  In addition, Verner reported he is able to swim laps and swim longer than he could previously due to Cardiac  Rehab program.   Ready for discharge from Cardiac Rehab today.      Take Less Medication   Goals Progress/Improvement seen  No No     Understand more about Heart/Pulmonary Disease   Goals Progress/Improvement seen   Yes Yes     Comments  Kaspar participated in the educational session today on "Other Matters of the Heart."  He added to the discussion on diabetes and heart disease.        Diabetes   Progress seen towards goals  Yes Yes     Comments  Patient reports Cardiac Rehab Dietitian and program has helped him with better eating habits for his heart and his diabetes.        Hypertension   Progress seen toward goals   Yes     Abnormal Lipids   Goal   --  Still on his cholestrol lowering medicine. Follows up with his primary care MD. etc.         Nutrition & Weight - Outcomes:     Pre Biometrics - 04/01/15 1508    Pre Biometrics   Height 5\' 10"  (1.778 m)   Weight 241 lb 3.2 oz (109.408 kg)   Waist Circumference 49.25 inches   Hip Circumference 41.25 inches   Waist to Hip Ratio 1.19 %   BMI (Calculated) 34.7         Post Biometrics - 06/10/15 1734     Post  Biometrics   Height 5\' 10"  (1.778 m)   Weight 246 lb 3.2 oz (111.676 kg)   Waist Circumference 51 inches   Hip Circumference 43 inches   Waist to Hip Ratio 1.19 %   BMI (Calculated) 35.4      Nutrition:     Nutrition Therapy & Goals - 04/18/15 1749    Nutrition Therapy   Diet Diabetic renal diet; Stage IV renal disease   Drug/Food Interactions Statins/Certain Fruits   Protein 6 ounces/day   Fruits and Vegetables 5 servings/day   Personal Nutrition Goals   Personal Goal #1 Continue eating 3 meals and 1-2 snacks each day, keep balanced with small portions of protein foods, 3 servings carbs, and a vegetable.   Personal Goal #2 Choose mostly low-postassium vegetables and fruits, no more than one high-postassium food per day      Nutrition Discharge:     Rate Your Plate - [id number]    Rate Your Plate  Scores   Post Score 61   Post Score % 67.7 %  Loyce reports his kidey doctor is glad he is eating what the Cardiac Rehab dietician suggesetd.       Education Questionnaire Score:     Knowledge Questionnaire Score - 05/23/15 1724    Knowledge Questionnaire Score   Post Score 21      Goals reviewed with patient; copy given to patient.

## 2015-06-10 NOTE — Progress Notes (Signed)
Cardiac Individual Treatment Plan  Patient Details  Name: Scott [name] MRN: [id number] Date of Birth: [birth year] Referring Provider:  Charolette Forward, MD  Initial Encounter Date:    Visit Diagnosis: S/P PTCA (percutaneous transluminal coronary angioplasty)  Patient's Home Medications on Admission:  Current outpatient prescriptions:  .  albuterol (PROVENTIL HFA;VENTOLIN HFA) 108 (90 BASE) MCG/ACT inhaler, Inhale 2 puffs into the lungs every 6 (six) hours as needed for wheezing or shortness of breath., Disp: 1 Inhaler, Rfl: 2 .  aspirin EC 81 MG tablet, Take 1 tablet (81 mg total) by mouth daily., Disp: 30 tablet, Rfl: 0 .  atorvastatin (LIPITOR) 80 MG tablet, Take 1 tablet (80 mg total) by mouth daily at 6 PM., Disp: 30 tablet, Rfl: 3 .  carvedilol (COREG) 25 MG tablet, Take 1 tablet (25 mg total) by mouth 2 (two) times daily with a meal., Disp: 30 tablet, Rfl: 1 .  ferrous sulfate 325 (65 FE) MG tablet, Take 1 tablet (325 mg total) by mouth 3 (three) times daily with meals., Disp: 90 tablet, Rfl: 3 .  FLUoxetine (PROZAC) 40 MG capsule, Take 40 mg by mouth at bedtime., Disp: , Rfl:  .  furosemide (LASIX) 20 MG tablet, Take 1 tablet (20 mg total) by mouth daily., Disp: 30 tablet, Rfl: 0 .  gabapentin (NEURONTIN) 100 MG capsule, Take 1 capsule (100 mg total) by mouth 3 (three) times daily., Disp: 90 capsule, Rfl: 0 .  insulin aspart protamine- aspart (NOVOLOG MIX 70/30) (70-30) 100 UNIT/ML injection, Inject 0.35 mLs (35 Units total) into the skin 2 (two) times daily with a meal., Disp: 20 mL, Rfl: 0 .  isosorbide-hydrALAZINE (BIDIL) 20-37.5 MG per tablet, Take 1 tablet by mouth 3 (three) times daily., Disp: 90 tablet, Rfl: 0 .  loratadine (CLARITIN) 10 MG tablet, Take 1 tablet (10 mg total) by mouth daily as needed for allergies., Disp: 30 tablet, Rfl: 0 .  nitroGLYCERIN (NITROSTAT) 0.4 MG SL tablet, Place 1 tablet (0.4 mg total) under the tongue every 5 (five) minutes x 3 doses as needed for  chest pain., Disp: 25 tablet, Rfl: 12 .  omeprazole (PRILOSEC) 20 MG capsule, Take 1 capsule (20 mg total) by mouth daily., Disp: 30 capsule, Rfl: 0 .  prasugrel (EFFIENT) 10 MG TABS tablet, Take 1 tablet (10 mg total) by mouth daily., Disp: 30 tablet, Rfl: 0 .  spironolactone (ALDACTONE) 25 MG tablet, Take 1 tablet (25 mg total) by mouth daily., Disp: 30 tablet, Rfl: 0  Past Medical History: Past Medical History  Diagnosis Date  . Non-Q wave myocardial infarction     /notes 12/13/2014  . Coronary artery disease     [name] 12/13/2014  . Chronic kidney disease (CKD), stage IV (severe)     [name] 12/13/2014  . Chronic disease anemia     [name] 12/13/2014  . Type II diabetes mellitus   . High cholesterol     [name] 12/13/2014  . PVD (peripheral vascular disease)     [name] 12/13/2014  . Dysrhythmia   . CHF (congestive heart failure)   . GERD (gastroesophageal reflux disease)   . Arthritis     "left arm; right leg" (12/13/2014)  . Depression     Tobacco Use: History  Smoking status  . Current Every Day Smoker -- 0.25 packs/day for 40 years  . Types: Cigarettes  Smokeless tobacco  . Never Used    Labs: Recent Review Flowsheet Data    Labs for ITP Cardiac and Pulmonary Rehab Latest  Ref Rng 10/03/2014 12/02/2014 12/13/2014 12/14/2014 01/09/2015   Cholestrol 0 - 200 mg/dL - 146 - 100 -   LDLCALC 0 - 99 mg/dL - 74 - 43 -   HDL >39 mg/dL - 45 - 37(L) -   Trlycerides <150 mg/dL - 134 - 98 -   Hemoglobin A1c 4.0 - 6.0 % 10.0(H) - 7.7(H) - 6.8(H)       Exercise Target Goals:    Exercise Program Goal: Individual exercise prescription set with THRR, safety & activity barriers. Participant demonstrates ability to understand and report RPE using BORG scale, to self-measure pulse accurately, and to acknowledge the importance of the exercise prescription.  Exercise Prescription Goal: Starting with aerobic activity 30 plus minutes a day, 3 days per week for initial exercise prescription.  Provide home exercise prescription and guidelines that participant acknowledges understanding prior to discharge.  Activity Barriers & Risk Stratification:     Activity Barriers & Risk Stratification - 04/01/15 1403    Activity Barriers & Risk Stratification   Activity Barriers Shortness of Breath;Balance Concerns;Arthritis;Other (comment)   Comments Left arm arthrithis   Risk Stratification High      6 Minute Walk:     6 Minute Walk      04/01/15 1504 06/10/15 1630 06/10/15 1733   6 Minute Walk   Phase Initial Discharge Discharge   Distance 1175 feet  1310 feet   Walk Time 6 minutes  6 minutes   Resting HR 73 bpm  88 bpm   Resting BP 110/70 mmHg  124/64 mmHg   Max Ex. HR 86 bpm  85 bpm   Max Ex. BP 110/70 mmHg  144/74 mmHg   RPE 13  14   Symptoms No  No      Initial Exercise Prescription:     Initial Exercise Prescription - 04/01/15 1500    Date of Initial Exercise Prescription   Date 04/01/15   Treadmill   MPH 2   Grade 0   Minutes 10   Bike   Level 0.4   Minutes 10   Recumbant Bike   Level 3   RPM 40   Watts 25   Minutes 10   NuStep   Level 3   Watts 45   Minutes 15   Arm Ergometer   Level 1   Watts 10   Minutes 10   Arm/Foot Ergometer   Level 4   Watts 15   Minutes 10   Cybex   Level 2   RPM 50   Minutes 10   Recumbant Elliptical   Level 1   RPM 40   Watts 10   Minutes 10   Elliptical   Level 1   Speed 3   Minutes 1   REL-XR   Level 3   Watts 45   Minutes 15   Prescription Details   Frequency (times per week) 3   Duration Progress to 30 minutes of continuous aerobic without signs/symptoms of physical distress   Intensity   THRR REST +  30   Ratings of Perceived Exertion 11-15   Progression Continue progressive overload as per policy without signs/symptoms or physical distress.   Resistance Training   Training Prescription Yes   Weight 2   Reps 10-15      Exercise Prescription Changes:     Exercise Prescription Changes       04/08/15 0700 05/13/15 0600 05/29/15 1700 06/05/15 0800 06/10/15 0700   Exercise Review   Progression No  Has not started program yet Yes Yes Yes Yes   Response to Exercise   Blood Pressure (Admit)  128/68 mmHg -- 146/86 mmHg --   Blood Pressure (Exercise)  138/78 mmHg -- 138/82 mmHg --   Blood Pressure (Exit)  122/70 mmHg -- 142/84 mmHg --   Heart Rate (Admit)  79 bpm -- 77 bpm --   Heart Rate (Exercise)  101 bpm -- 98 bpm --   Heart Rate (Exit)  79 bpm -- 86 bpm --   Rating of Perceived Exertion (Exercise)  14 -- 14 --   Symptoms    Nonr None   Comments   Reviewed individualized exercise prescription and made increases per departmental policy. Exercise increases were discussed with the patient and they were able to perform the new work loads without issue (no signs or symptoms). Discussed Kasten adding 2d/wk of exercise outside of class. He is going to exercise at [facility] and is already exerising outside of class. He said he would continue this with the goal of exericsing 5d/wk every week.  Is continuing to exercise 2d/wk in addition to 3d/wk at [facility]. He swims and also is a member at [employer] where he rides the bike and [employer].  Reviewed individualized exercise prescription and made increases per departmental policy. Exercise increases were discussed with the patient and they were able to perform the new work loads without issue (no signs or symptoms).    Frequency   Add 2 additional days to program exercise sessions. --    Duration  Progress to 30 minutes of continuous aerobic without signs/symptoms of physical distress Progress to 50 minutes of aerobic without signs/symptoms of physical distress Progress to 50 minutes of aerobic without signs/symptoms of physical distress Progress to 50 minutes of aerobic without signs/symptoms of physical distress   Intensity  Rest + 30 Rest + 30 Rest + 30 Rest + 30   Progression  Continue progressive overload as per policy without  signs/symptoms or physical distress. Continue progressive overload as per policy without signs/symptoms or physical distress. Continue progressive overload as per policy without signs/symptoms or physical distress. Continue progressive overload as per policy without signs/symptoms or physical distress.   Resistance Training   Training Prescription  Yes Yes Yes Yes   Weight  _0 Reps  10-15 10-15 10-15 10-15   Interval Training   Interval Training  Yes Yes Yes Yes   Equipment  NuStep;Treadmill NuStep;Treadmill NuStep;Treadmill NuStep;Treadmill   Treadmill   MPH  2.2 2.5 2.5 2.5   Grade  0 _1 Minutes  _2 NuStep   Level  _3 Watts  45 80 45 87   Minutes  _4 Recumbant Elliptical   Level   6  BioStep 6  BioStep 6  BioStep   Watts   35 35 35   Minutes   _5 Discharge Exercise Prescription (Final Exercise Prescription Changes):     Exercise Prescription Changes - 06/10/15 0700    Exercise Review   Progression Yes   Response to Exercise   Blood Pressure (Admit) --   Blood Pressure (Exercise) --   Blood Pressure (Exit) --   Heart Rate (Admit) --   Heart Rate (Exercise) --   Heart Rate (Exit) --   Rating of Perceived Exertion (Exercise) --   Symptoms None  Comments Reviewed individualized exercise prescription and made increases per departmental policy. Exercise increases were discussed with the patient and they were able to perform the new work loads without issue (no signs or symptoms).    Duration Progress to 50 minutes of aerobic without signs/symptoms of physical distress   Intensity Rest + 30   Progression Continue progressive overload as per policy without signs/symptoms or physical distress.   Resistance Training   Training Prescription Yes   Weight 4   Reps 10-15   Interval Training   Interval Training Yes   Equipment NuStep;Treadmill   Treadmill   MPH 2.5   Grade 1   Minutes 25   NuStep   Level 5   Watts 45    Minutes 20   Recumbant Elliptical   Level 6  BioStep   Watts 35   Minutes 15      Nutrition:  Target Goals: Understanding of nutrition guidelines, daily intake of sodium <1558m, cholesterol <2035m calories 30% from fat and 7% or less from saturated fats, daily to have 5 or more servings of fruits and vegetables.  Biometrics:     Pre Biometrics - 04/01/15 1508    Pre Biometrics   Height 5' 10" (1.778 m)   Weight 241 lb 3.2 oz (109.408 kg)   Waist Circumference 49.25 inches   Hip Circumference 41.25 inches   Waist to Hip Ratio 1.19 %   BMI (Calculated) 34.7         Post Biometrics - 06/10/15 1734     Post  Biometrics   Height 5' 10" (1.778 m)   Weight 246 lb 3.2 oz (111.676 kg)   Waist Circumference 51 inches   Hip Circumference 43 inches   Waist to Hip Ratio 1.19 %   BMI (Calculated) 35.4      Nutrition Therapy Plan and Nutrition Goals:     Nutrition Therapy & Goals - 04/18/15 1749    Nutrition Therapy   Diet Diabetic renal diet; Stage IV renal disease   Drug/Food Interactions Statins/Certain Fruits   Protein 6 ounces/day   Fruits and Vegetables 5 servings/day   Personal Nutrition Goals   Personal Goal #1 Continue eating 3 meals and 1-2 snacks each day, keep balanced with small portions of protein foods, 3 servings carbs, and a vegetable.   Personal Goal #2 Choose mostly low-postassium vegetables and fruits, no more than one high-postassium food per day      Nutrition Discharge: Rate Your Plate Scores:     Rate Your Plate - 0904/88/8971694  Rate Your Plate Scores   Post Score 61   Post Score % 67.7 %  Kaylan reports his kidey doctor is glad he is eating what the Cardiac Rehab dietician suggesetd.       Nutrition Goals Re-Evaluation:     Nutrition Goals Re-Evaluation      04/17/15 1152 05/08/15 1807 05/15/15 1239 05/16/15 1636 05/22/15 1823   Personal Goal #1 Re-Evaluation   Personal Goal #1 Blood sugar got low at end of class. Discussed possibly  eating peanut butter crackers before 3:45pm class since it finishes at 5:"45pm exercise.   ChChikeats to keep his diabetes in check.  ChAdynad an MD appt with his Diabetic MD today and has another one on Monday. Jaxx reports MD will probably up or change his insulin since he has been on insulin for 15 or so years he reports.  ChCamrineported today that he saw his kidney doctor who told  him what he is now eating is helping. Dietician  met with Ardyth Gal and instructed him on foods for his kidney problems.    Goal Progress Seen Yes  Yes Yes    Comments  Jeanpierre states he is following the nutrition guidelines. He watches his blood sugar levels and tries to eat accordingly.       Personal Goal #2 Re-Evaluation   Personal Goal #2  Choose mostly low-postassium vegetables and fruits, no more than one high-postassium food per day  Shain said he now knows bettr low potassium foods like if he eats potatoes "to boil the potatoes twice".    Goal Progress Seen  Yes Yes     Comments  Desi is aware of the nedd to limit the high potassium foods.  He is following the guidelines given to him by the RD.         06/10/15 1857           Personal Goal #1 Re-Evaluation   Personal Goal #1 Kidney doctor is happy what he is eating and his blood work.        Goal Progress Seen Yes       Personal Goal #2 Re-Evaluation   Goal Progress Seen Yes          Psychosocial: Target Goals: Acknowledge presence or absence of depression, maximize coping skills, provide positive support system. Participant is able to verbalize types and ability to use techniques and skills needed for reducing stress and depression.  Initial Review & Psychosocial Screening:     Initial Psych Review & Screening - 04/01/15 1407    Initial Review   Current issues with Current Sleep Concerns   Family Dynamics   Concerns No support system   Barriers   Psychosocial barriers to participate in program There are no identifiable barriers or  psychosocial needs.;The patient should benefit from training in stress management and relaxation.   Screening Interventions   Interventions Encouraged to exercise;Program counselor consult      Quality of Life Scores:     Quality of Life - 05/24/15 1101    Quality of Life Scores   Health/Function Post 25.9 %   Health/Function % Change 75 %   Socioeconomic Post 26.75 %   Socioeconomic % Change 95 %   Psych/Spiritual Post 30 %   Psych/Spiritual % Change 835 %   Family Post 26.7 %   Family % Change 17 %   GLOBAL Post 27.03 %   GLOBAL % Change 102 %      PHQ-9:     Recent Review Flowsheet Data    Depression screen [facility] 2/9 05/23/2015 04/01/2015   Decreased Interest 3 1   Down, Depressed, Hopeless 0 1   PHQ - 2 Score 3 2   Altered sleeping 3 3   Tired, decreased energy 1 3   Change in appetite 3 3   Feeling bad or failure about yourself  0 3   Trouble concentrating 2 3   Moving slowly or fidgety/restless 0 3   Suicidal thoughts 0 0   PHQ-9 Score 12 20   Difficult doing work/chores Not difficult at all Not difficult at all      Psychosocial Evaluation and Intervention:     Psychosocial Evaluation - 04/17/15 1728    Psychosocial Evaluation & Interventions   Comments Counselor met with Mr. Meinecke today for initial psychosocial evaluation.  He is a 54 year old who has congestive heart failure.  He has a strong  support system with a significant other and an adult son who lives close by.  Mr. Lemmie Evens reports he is sleeping "okay" and his appetite is good.  He admits to a history of depressive symptoms and was on medication for this until about 6-7 months ago.  He reports his mood has been more irritable lately as well as other depressive symptoms, with high PHQ-9 score of 20 and low quality of life scores indicating depression reported.  As a result he is encouraged and  is considering asking his Dr. about continuing that medication to address these issues.  Mr. Lemmie Evens has goals to be able to  walk better and longer and increase his stamina and strength while in this program.      Continued Psychosocial Services Needed Yes      Psychosocial Re-Evaluation:     Psychosocial Re-Evaluation      05/01/15 1738 06/05/15 1705 06/10/15 1859       Psychosocial Re-Evaluation   Comments Counselor follow up with Mr. Kallenbach in regards to discussing anti-depressants with his PCP.  He reported he tried and they encouraged him to see a psychiatrist.  Counselor provided Mr. Romack with contact information in order to address this concern.  Counselor will follow up as needed.  Counselor follow up with Mr. Lafosse in regards to discussing anti-depressants with his PCP.  He reported he tried and they encouraged him to see a psychiatrist.  Counselor provided Mr. Hillock with contact information in order to address this concern.  Counselor will follow up as needed.   Follow up with Mr. Thrush today reporting he was able to see Dr. Pervis Hocking in [place] who prescribed the Prozac generic (Fluoxetine) 40 mg QHS several weeks ago. Mr. Hommes states his mood has improved; he is less irritable and thinking more clearly. Santos is feeling better emotioanlly.         Vocational Rehabilitation: Provide vocational rehab assistance to qualifying candidates.   Vocational Rehab Evaluation & Intervention:     Vocational Rehab - 04/01/15 1405    Initial Vocational Rehab Evaluation & Intervention   Assessment shows need for Vocational Rehabilitation No      Education: Education Goals: Education classes will be provided on a weekly basis, covering required topics. Participant will state understanding/return demonstration of topics presented.  Learning Barriers/Preferences:     Learning Barriers/Preferences - 04/01/15 1404    Learning Barriers/Preferences   Learning Barriers None   Learning Preferences Video      Education Topics: General Nutrition Guidelines/Fats and Fiber: -Group instruction  provided by verbal, written material, models and posters to present the general guidelines for heart healthy nutrition. Gives an explanation and review of dietary fats and fiber.          Cardiac Rehab from 06/10/2015 in [facility] Cardiac Rehab   Date  06/10/15   Educator  PI   Instruction Review Code  2- meets goals/outcomes      Controlling Sodium/Reading Food Labels: -Group verbal and written material supporting the discussion of sodium use in heart healthy nutrition. Review and explanation with models, verbal and written materials for utilization of the food label.      Cardiac Rehab from 06/10/2015 in [facility] Cardiac Rehab   Date  04/22/15   Educator  PI   Instruction Review Code  2- meets goals/outcomes      Exercise Physiology & Risk Factors: - Group verbal and written instruction with models to review the exercise physiology of the cardiovascular system and  associated critical values. Details cardiovascular disease risk factors and the goals associated with each risk factor.      Cardiac Rehab from 06/10/2015 in [facility] Cardiac Rehab   Date  05/08/15   Educator  RM   Instruction Review Code  2- meets goals/outcomes      Aerobic Exercise & Resistance Training: - Gives group verbal and written discussion on the health impact of inactivity. On the components of aerobic and resistive training programs and the benefits of this training and how to safely progress through these programs.      Cardiac Rehab from 06/10/2015 in [facility] Cardiac Rehab   Date  05/13/15   Educator  SW   Instruction Review Code  2- meets goals/outcomes      Flexibility, Balance, General Exercise Guidelines: - Provides group verbal and written instruction on the benefits of flexibility and balance training programs. Provides general exercise guidelines with specific guidelines to those with heart or lung disease. Demonstration and skill practice provided.      Cardiac Rehab from 06/10/2015 in [facility] Cardiac Rehab    Date  05/20/15   Educator  SW   Instruction Review Code  2- meets goals/outcomes      Stress Management: - Provides group verbal and written instruction about the health risks of elevated stress, cause of high stress, and healthy ways to reduce stress.      Cardiac Rehab from 06/10/2015 in [facility] Cardiac Rehab   Date  05/22/15   Educator  Kathreen Cornfield   Instruction Review Code  2- meets goals/outcomes      Depression: - Provides group verbal and written instruction on the correlation between heart/lung disease and depressed mood, treatment options, and the stigmas associated with seeking treatment.   Anatomy & Physiology of the Heart: - Group verbal and written instruction and models provide basic cardiac anatomy and physiology, with the coronary electrical and arterial systems. Review of: AMI, Angina, Valve disease, Heart Failure, Cardiac Arrhythmia, Pacemakers, and the ICD.   Cardiac Procedures: - Group verbal and written instruction and models to describe the testing methods done to diagnose heart disease. Reviews the outcomes of the test results. Describes the treatment choices: Medical Management, Angioplasty, or Coronary Bypass Surgery.      Cardiac Rehab from 06/10/2015 in [facility] Cardiac Rehab   Date  04/29/15   Educator  SB   Instruction Review Code  2- meets goals/outcomes      Cardiac Medications: - Group verbal and written instruction to review commonly prescribed medications for heart disease. Reviews the medication, class of the drug, and side effects. Includes the steps to properly store meds and maintain the prescription regimen.      Cardiac Rehab from 06/10/2015 in [facility] Cardiac Rehab   Date  05/01/15   Educator  DW   Instruction Review Code  2- meets goals/outcomes      Go Sex-Intimacy & Heart Disease, Get SMART - Goal Setting: - Group verbal and written instruction through game format to discuss heart disease and the return to sexual intimacy. Provides group  verbal and written material to discuss and apply goal setting through the application of the S.M.A.R.T. Method.      Cardiac Rehab from 06/10/2015 in [facility] Cardiac Rehab   Date  04/29/15   Educator  SB   Instruction Review Code  2- meets goals/outcomes      Other Matters of the Heart: - Provides group verbal, written materials and models to describe Heart Failure, Angina, Valve Disease,  and Diabetes in the realm of heart disease. Includes description of the disease process and treatment options available to the cardiac patient.      Cardiac Rehab from 06/10/2015 in [facility] Cardiac Rehab   Date  06/05/15 [Second class on Other Matters of the Heart]   Educator  DW   Instruction Review Code  2- meets goals/outcomes      Exercise & Equipment Safety: - Individual verbal instruction and demonstration of equipment use and safety with use of the equipment.      Cardiac Rehab from 06/10/2015 in [facility] Cardiac Rehab   Date  04/01/15   Educator  S BIce   Instruction Review Code  2- meets goals/outcomes      Infection Prevention: - Provides verbal and written material to individual with discussion of infection control including proper hand washing and proper equipment cleaning during exercise session.      Cardiac Rehab from 06/10/2015 in [facility] Cardiac Rehab   Date  04/01/15   Educator  S Bice   Instruction Review Code  2- meets goals/outcomes      Falls Prevention: - Provides verbal and written material to individual with discussion of falls prevention and safety.      Cardiac Rehab from 06/10/2015 in [facility] Cardiac Rehab   Date  04/01/15   Educator  S Bice   Instruction Review Code  2- meets goals/outcomes      Diabetes: - Individual verbal and written instruction to review signs/symptoms of diabetes, desired ranges of glucose level fasting, after meals and with exercise. Advice that pre and post exercise glucose checks will be done for 3 sessions at entry of program.      Cardiac Rehab  from 06/10/2015 in [facility] Cardiac Rehab   Date  04/01/15   Educator  S Bice   Instruction Review Code  2- meets goals/outcomes       Knowledge Questionnaire Score:     Knowledge Questionnaire Score - 05/23/15 1724    Knowledge Questionnaire Score   Post Score 21      Personal Goals and Risk Factors at Admission:     Personal Goals and Risk Factors at Admission - 04/01/15 1410    Personal Goals and Risk Factors on Admission    Weight Management Obesity;Yes   Intervention Learn and follow the exercise and diet guidelines while in the program. Utilize the nutrition and education classes to help gain knowledge of the diet and exercise expectations in the program   Intervention Provide weight management tools through evaluation completed by registered dietician and exercise physiologist.  Establish a goal weight with participant.   Increase Aerobic Exercise and Physical Activity Sedentary;Yes   Intervention While in program, learn and follow the exercise prescription taught. Start at a low level workload and increase workload after able to maintain previous level for 30 minutes. Increase time before increasing intensity.   Intervention Provide exercise education and an individualized exercise prescription that will provide continued progressive overload as per policy without signs/symptoms of physical distress.   Quit Smoking Yes   Number of packs per day 1 pack per week for 40 years   Quit July of 2016   Intervention Utilize your health care professional team to help with smoking cessation while in the program. Your doctor can prescribe medications to aid in cessation. The program can provide information and counseling as needed.   Understand more about Heart/Pulmonary Disease. Yes   Diabetes Yes   Goal Blood glucose control identified by blood  glucose values, HgbA1C. Participant verbalizes understanding of the signs/symptoms of hyper/hypo glycemia, proper foot care and importance of  medication and nutrition plan for blood glucose control.   Intervention Provide nutrition & aerobic exercise along with prescribed medications to achieve blood glucose in normal ranges: Fasting 65-99 mg/dL   Hypertension Yes   Goal Participant will see blood pressure controlled within the values of 140/67m/Hg or within value directed by their physician.   Intervention Provide nutrition & aerobic exercise along with prescribed medications to achieve BP 140/90 or less.   Lipids Yes   Goal Cholesterol controlled with medications as prescribed, with individualized exercise RX and with personalized nutrition plan. Value goals: LDL < 745m HDL > 4072mParticipant states understanding of desired cholesterol values and following prescriptions.   Intervention Provide nutrition & aerobic exercise along with prescribed medications to achieve LDL <106m50mDL >40mg40m   Personal Goals and Risk Factors Review:      Goals and Risk Factor Review      04/08/15 1833 04/17/15 1153 05/08/15 1810 05/16/15 1644 05/22/15 1825   Increase Aerobic Exercise and Physical Activity   Goals Progress/Improvement seen  _0    Comments Quit smoking 3 months ago and has not smoked since. He said he has gained a little bit of weight though since then.   ChestMuscabollowing the exercise progression well. He stated that [facility] to get out to his grapevines and work in the garden.  Before he could barely get outside before he had to stop and rest. Legs hurt 5/5 today with peripheral neuropathy so had to stop on treadmill and take a rest but overall is doing better.  ChestColtenes that he is doing so much better and feels that the treadmill is helping.    Take Less Medication   Goals Progress/Improvement seen   No  No   Comments   ChestLandynnware he will need time  before any meds are changed.   He is expecting a decrease in his insulin dose, his blood sugar levels  are controlled and low at times.  ChestZylanrts no change recently    Understand more about Heart/Pulmonary Disease   Goals Progress/Improvement seen    Yes  Yes   Comments   He stated that he enjoys the classes.  He even told his cardiologist ,they need to come listen and learn. As he has learned more in class here than anywhere else.  ChestHarvestvery engaged in today's class on Stress and heart disease.    Diabetes   Goal  Blood glucose control identified by blood glucose values, HgbA1C. Participant verbalizes understanding of the signs/symptoms of hyper/hypo glycemia, proper foot care and importance of medication and nutrition plan for blood glucose control. Blood glucose control identified by blood glucose values, HgbA1C. Participant verbalizes understanding of the signs/symptoms of hyper/hypo glycemia, proper foot care and importance of medication and nutrition plan for blood glucose control.     Progress seen towards goals   Yes     Comments   Blood sugar levels were dropping with exercise. ChestDareto work on eating prior to class, adn has lowered or held his sliding scale dose on exercise days, because he was dropping to low and requiring glucose tabs.     Hypertension   Goal   Participant will see blood pressure controlled within the values of 140/90mm/29mr within value  directed by their physician.     Progress seen toward goals   Yes  Yes   Comments   BP is well controlled.   130/80 range or lower.  HAD been 180/  per Ardyth Gal.  Kamarri's blood pressures have been stable.      05/30/15 1705 06/05/15 1916 06/10/15 1858       Increase Aerobic Exercise and Physical Activity   Goals Progress/Improvement seen  Yes Yes Yes     Comments Ajeet is now able to swim full laps. Tyriek swam for 35 minutes last evening after attending Cardiac REhab he reported.  Ardyth Gal reported today that he has seen an improvement in his walking, as he can walk farther.  In addition, Quamere reported he is  able to swim laps and swim longer than he could previously due to Cardiac Rehab program.   Ready for discharge from Cardiac Rehab today.      Take Less Medication   Goals Progress/Improvement seen  No No     Understand more about Heart/Pulmonary Disease   Goals Progress/Improvement seen   Yes Yes     Comments  Almando participated in the educational session today on "Other Matters of the Heart."  He added to the discussion on diabetes and heart disease.        Diabetes   Progress seen towards goals  Yes Yes     Comments  Patient reports Cardiac Rehab Dietitian and program has helped him with better eating habits for his heart and his diabetes.        Hypertension   Progress seen toward goals   Yes     Abnormal Lipids   Goal   --  Still on his cholestrol lowering medicine. Follows up with his primary care MD. etc.         Personal Goals Discharge (Final Personal Goals and Risk Factors Review):      Goals and Risk Factor Review - 06/10/15 1858    Increase Aerobic Exercise and Physical Activity   Goals Progress/Improvement seen  Yes   Comments Ready for discharge from Cardiac Rehab today.    Take Less Medication   Goals Progress/Improvement seen No   Understand more about Heart/Pulmonary Disease   Goals Progress/Improvement seen  Yes   Diabetes   Progress seen towards goals Yes   Hypertension   Progress seen toward goals Yes   Abnormal Lipids   Goal --  Still on his cholestrol lowering medicine. Follows up with his primary care MD. etc.        Comments: 36 sessions completed today. Larnie did his 6 minute walk test with no problems today.

## 2015-06-10 NOTE — Progress Notes (Signed)
Daily Session Note  Patient Details  Name: Scott [name] MRN: [id number] Date of Birth: 02-27-1961 Referring Provider:  Charolette Forward, MD  Encounter Date: 06/10/2015  Check In:     Session Check In - 06/10/15 1626    Check-In   Staff Present Lestine Box BS, ACSM EP-C, Exercise Physiologist;Other;Carroll Enterkin RN, BSN   ER physicians immediately available to respond to emergencies See telemetry face sheet for immediately available ER MD   Medication changes reported     No   Fall or balance concerns reported    No   Warm-up and Cool-down Performed on first and last piece of equipment   VAD Patient? No   Pain Assessment   Currently in Pain? No/denies           Exercise Prescription Changes - 06/10/15 0700    Exercise Review   Progression Yes   Response to Exercise   Blood Pressure (Admit) --   Blood Pressure (Exercise) --   Blood Pressure (Exit) --   Heart Rate (Admit) --   Heart Rate (Exercise) --   Heart Rate (Exit) --   Rating of Perceived Exertion (Exercise) --   Symptoms None   Comments Reviewed individualized exercise prescription and made increases per departmental policy. Exercise increases were discussed with the patient and they were able to perform the new work loads without issue (no signs or symptoms).    Duration Progress to 50 minutes of aerobic without signs/symptoms of physical distress   Intensity Rest + 30   Progression Continue progressive overload as per policy without signs/symptoms or physical distress.   Resistance Training   Training Prescription Yes   Weight 4   Reps 10-15   Interval Training   Interval Training Yes   Equipment NuStep;Treadmill   Treadmill   MPH 2.5   Grade 1   Minutes 25   NuStep   Level 5   Watts 45   Minutes 20   Recumbant Elliptical   Level 6  BioStep   Watts 35   Minutes 15      Goals Met:  Proper associated with RPD/PD & O2 Sat Exercise tolerated well No report of cardiac concerns or  symptoms Strength training completed today  Goals Unmet:  Not Applicable  Goals Comments:    Dr. Emily Filbert is Medical Director for [facility].

## 2015-06-18 ENCOUNTER — Ambulatory Visit: Payer: Self-pay

## 2015-07-16 ENCOUNTER — Ambulatory Visit: Payer: Self-pay

## 2015-08-28 ENCOUNTER — Ambulatory Visit: Payer: Medicaid Other

## 2015-09-17 ENCOUNTER — Ambulatory Visit: Payer: Self-pay

## 2015-09-17 DIAGNOSIS — E119 Type 2 diabetes mellitus without complications: Secondary | ICD-10-CM

## 2015-09-17 DIAGNOSIS — E1129 Type 2 diabetes mellitus with other diabetic kidney complication: Secondary | ICD-10-CM | POA: Insufficient documentation

## 2015-09-17 LAB — HEMOGLOBIN A1C: HEMOGLOBIN A1C: 9.1

## 2015-09-19 ENCOUNTER — Ambulatory Visit: Payer: Self-pay | Admitting: Ophthalmology

## 2015-09-26 ENCOUNTER — Ambulatory Visit: Payer: Self-pay | Admitting: Ophthalmology

## 2015-09-26 LAB — HM DIABETES EYE EXAM

## 2015-10-06 ENCOUNTER — Encounter: Payer: Self-pay | Admitting: Cardiology

## 2015-10-22 ENCOUNTER — Ambulatory Visit: Payer: Self-pay

## 2015-11-18 ENCOUNTER — Other Ambulatory Visit: Payer: Self-pay | Admitting: Family Medicine

## 2015-11-19 ENCOUNTER — Other Ambulatory Visit: Payer: Self-pay

## 2015-11-19 DIAGNOSIS — N289 Disorder of kidney and ureter, unspecified: Secondary | ICD-10-CM

## 2015-11-19 DIAGNOSIS — K219 Gastro-esophageal reflux disease without esophagitis: Secondary | ICD-10-CM

## 2015-11-19 DIAGNOSIS — E119 Type 2 diabetes mellitus without complications: Secondary | ICD-10-CM

## 2015-11-19 DIAGNOSIS — I10 Essential (primary) hypertension: Secondary | ICD-10-CM

## 2015-11-19 DIAGNOSIS — Z794 Long term (current) use of insulin: Principal | ICD-10-CM

## 2015-11-19 NOTE — Telephone Encounter (Signed)
Received fax from Medication management to refill Gabapentin 100 mg capsules

## 2015-11-21 MED ORDER — GABAPENTIN 100 MG PO CAPS: 100.0000 mg | ORAL_CAPSULE | Freq: Three times a day (TID) | ORAL | Status: DC

## 2015-12-11 ENCOUNTER — Encounter: Payer: Self-pay | Admitting: Nurse Practitioner

## 2015-12-17 ENCOUNTER — Ambulatory Visit: Payer: Self-pay

## 2016-01-06 ENCOUNTER — Encounter: Payer: Self-pay | Admitting: Cardiology

## 2016-01-14 ENCOUNTER — Encounter: Payer: Self-pay | Admitting: Emergency Medicine

## 2016-01-14 ENCOUNTER — Emergency Department: Payer: Medicare HMO

## 2016-01-14 ENCOUNTER — Emergency Department
Admission: EM | Admit: 2016-01-14 | Discharge: 2016-01-14 | Disposition: A | Discharge status: Home / Self Care | Payer: Medicare HMO | Attending: Emergency Medicine | Admitting: Emergency Medicine

## 2016-01-14 DIAGNOSIS — W57XXXA Bitten or stung by nonvenomous insect and other nonvenomous arthropods, initial encounter: Secondary | ICD-10-CM | POA: Insufficient documentation

## 2016-01-14 DIAGNOSIS — Y999 Unspecified external cause status: Secondary | ICD-10-CM | POA: Diagnosis not present

## 2016-01-14 DIAGNOSIS — I5021 Acute systolic (congestive) heart failure: Secondary | ICD-10-CM | POA: Insufficient documentation

## 2016-01-14 DIAGNOSIS — F329 Major depressive disorder, single episode, unspecified: Secondary | ICD-10-CM | POA: Insufficient documentation

## 2016-01-14 DIAGNOSIS — E1122 Type 2 diabetes mellitus with diabetic chronic kidney disease: Secondary | ICD-10-CM | POA: Insufficient documentation

## 2016-01-14 DIAGNOSIS — Z79899 Other long term (current) drug therapy: Secondary | ICD-10-CM | POA: Diagnosis not present

## 2016-01-14 DIAGNOSIS — N184 Chronic kidney disease, stage 4 (severe): Secondary | ICD-10-CM | POA: Insufficient documentation

## 2016-01-14 DIAGNOSIS — L03114 Cellulitis of left upper limb: Secondary | ICD-10-CM

## 2016-01-14 DIAGNOSIS — I252 Old myocardial infarction: Secondary | ICD-10-CM | POA: Diagnosis not present

## 2016-01-14 DIAGNOSIS — Y929 Unspecified place or not applicable: Secondary | ICD-10-CM | POA: Insufficient documentation

## 2016-01-14 DIAGNOSIS — Y939 Activity, unspecified: Secondary | ICD-10-CM | POA: Insufficient documentation

## 2016-01-14 DIAGNOSIS — Z794 Long term (current) use of insulin: Secondary | ICD-10-CM | POA: Diagnosis not present

## 2016-01-14 DIAGNOSIS — Z7982 Long term (current) use of aspirin: Secondary | ICD-10-CM | POA: Diagnosis not present

## 2016-01-14 DIAGNOSIS — Z87891 Personal history of nicotine dependence: Secondary | ICD-10-CM | POA: Insufficient documentation

## 2016-01-14 DIAGNOSIS — M1990 Unspecified osteoarthritis, unspecified site: Secondary | ICD-10-CM | POA: Insufficient documentation

## 2016-01-14 DIAGNOSIS — S60562A Insect bite (nonvenomous) of left hand, initial encounter: Secondary | ICD-10-CM | POA: Diagnosis not present

## 2016-01-14 DIAGNOSIS — I2510 Atherosclerotic heart disease of native coronary artery without angina pectoris: Secondary | ICD-10-CM | POA: Diagnosis not present

## 2016-01-14 DIAGNOSIS — R2232 Localized swelling, mass and lump, left upper limb: Secondary | ICD-10-CM | POA: Diagnosis present

## 2016-01-14 DIAGNOSIS — I130 Hypertensive heart and chronic kidney disease with heart failure and stage 1 through stage 4 chronic kidney disease, or unspecified chronic kidney disease: Secondary | ICD-10-CM | POA: Diagnosis not present

## 2016-01-14 DIAGNOSIS — J45909 Unspecified asthma, uncomplicated: Secondary | ICD-10-CM | POA: Insufficient documentation

## 2016-01-14 MED ORDER — METHYLPREDNISOLONE SODIUM SUCC 125 MG IJ SOLR
80.0000 mg | Freq: Once | INTRAMUSCULAR | Status: AC
  Administered 2016-01-14: 80 mg via INTRAMUSCULAR
  Filled 2016-01-14: qty 2

## 2016-01-14 MED ORDER — METHYLPREDNISOLONE SODIUM SUCC 125 MG IJ SOLR: 80.0000 mg | Freq: Once | INTRAMUSCULAR | Status: DC

## 2016-01-14 MED ORDER — DIPHENHYDRAMINE HCL 25 MG PO CAPS: ORAL_CAPSULE | ORAL | Status: DC

## 2016-01-14 NOTE — ED Provider Notes (Signed)
[facility] Emergency Department Provider Note  ____________________________________________  Time seen: Approximately 1:11 PM  I have reviewed the triage vital signs and the nursing notes.   HISTORY  Chief Complaint Hand Pain    HPI THIERRY RITTENBERRY is a 55 y.o. male , NAD, presents to the emergency department with several hour history of left hand pain and swelling. Patient states that he was stung by a "yellow fly" multiple times about the left arm yesterday. He took 10 mL of children's Benadryl last night prior to going to bed. Woke this morning with his left hand swollen and with increasing pain. Took another 10 mL of children's Benadryl this morning without resolution of his symptoms. Has not had any chest pain, shortness breath, wheezing, difficulty swallowing or breathing. Has not noted any swelling about his throat, tongue, mouth. Has not noted any skin sores or open wounds. Had denies numbness, weakness, tingling.   Past Medical History  Diagnosis Date  . Non-Q wave myocardial infarction ([place])     [name] 12/13/2014  . Coronary artery disease     [name] 12/13/2014  . Chronic kidney disease (CKD), stage IV (severe) ([place])     [name] 12/13/2014  . Chronic disease anemia     [name] 12/13/2014  . Type II diabetes mellitus ([place])   . High cholesterol     [name] 12/13/2014  . PVD (peripheral vascular disease) ([place])     [name] 12/13/2014  . Dysrhythmia   . CHF (congestive heart failure) ([place])   . GERD (gastroesophageal reflux disease)   . Arthritis     "left arm; right leg" (12/13/2014)  . Depression   . Asthma   . Hypertension     Patient Active Problem List   Diagnosis Date Noted  . Diabetes ([place]) 09/17/2015  . Decreased renal function 05/23/2015  . GERD (gastroesophageal reflux disease) 04/04/2015  . Hypertension 02/26/2015  . Flash pulmonary edema ([place]) 01/09/2015  . Acute systolic congestive heart failure ([place]) 12/13/2014    Past Surgical  History  Procedure Laterality Date  . Toe amputation Right 2015    4th toe  . Incision and drainage of wound Right ~ 10/2014    "4th toe foot"  . Cardiac catheterization  11/2014  . Percutaneous coronary stent intervention (pci-s) N/A 12/14/2014    Procedure: PERCUTANEOUS CORONARY STENT INTERVENTION (PCI-S);  Surgeon: Charolette Forward, MD;  Location: [facility] CATH LAB;  Service: Cardiovascular;  Laterality: N/A;  . Percutaneous coronary stent intervention (pci-s) N/A 12/18/2014    Procedure: PERCUTANEOUS CORONARY STENT INTERVENTION (PCI-S);  Surgeon: Charolette Forward, MD;  Location: [facility] CATH LAB;  Service: Cardiovascular;  Laterality: N/A;    Current Outpatient Rx  Name  Route  Sig  Dispense  Refill  . albuterol (PROVENTIL HFA;VENTOLIN HFA) 108 (90 BASE) MCG/ACT inhaler   Inhalation   Inhale 2 puffs into the lungs every 6 (six) hours as needed for wheezing or shortness of breath.   1 Inhaler   2   . amLODipine (NORVASC) 5 MG tablet   Oral   Take 5 mg by mouth daily.         [name] aspirin EC 81 MG tablet   Oral   Take 1 tablet (81 mg total) by mouth daily.   30 tablet   0   . atorvastatin (LIPITOR) 80 MG tablet   Oral   Take 1 tablet (80 mg total) by mouth daily at 6 PM.   30 tablet   3   .  carvedilol (COREG) 25 MG tablet   Oral   Take 1 tablet (25 mg total) by mouth 2 (two) times daily with a meal.   30 tablet   1   . cetirizine (ZYRTEC) 10 MG tablet   Oral   Take 10 mg by mouth as needed for allergies (1 tablet PRN daily.).         [name] cilostazol (PLETAL) 50 MG tablet   Oral   Take 50 mg by mouth 2 (two) times daily.         . diphenhydrAMINE (BENADRYL) 25 mg capsule      Take 1-2 tablets by mouth every 6 hours as needed   30 capsule   0   . ferrous sulfate 325 (65 FE) MG tablet   Oral   Take 1 tablet (325 mg total) by mouth 3 (three) times daily with meals.   90 tablet   3   . FLUoxetine (PROZAC) 40 MG capsule   Oral   Take 40 mg by mouth at bedtime.         .  furosemide (LASIX) 20 MG tablet   Oral   Take 1 tablet (20 mg total) by mouth daily.   30 tablet   0   . gabapentin (NEURONTIN) 100 MG capsule   Oral   Take 1 capsule (100 mg total) by mouth 3 (three) times daily.   90 capsule   0   . insulin aspart protamine- aspart (NOVOLOG MIX 70/30) (70-30) 100 UNIT/ML injection   Subcutaneous   Inject 0.35 mLs (35 Units total) into the skin 2 (two) times daily with a meal.   20 mL   0   . isosorbide-hydrALAZINE (BIDIL) 20-37.5 MG per tablet   Oral   Take 1 tablet by mouth 3 (three) times daily.   90 tablet   0   . Liraglutide (VICTOZA) 18 MG/3ML SOPN   Subcutaneous   Inject 1.8 mg into the skin daily. 0.6mg  daily x 1 week, then 1.2mg  daily one week, then 1.8mg  daily.         [name] lisinopril (PRINIVIL,ZESTRIL) 10 MG tablet   Oral   Take 10 mg by mouth daily.         [name] loratadine (CLARITIN) 10 MG tablet   Oral   Take 1 tablet (10 mg total) by mouth daily as needed for allergies.   30 tablet   0   . nitroGLYCERIN (NITROSTAT) 0.4 MG SL tablet   Sublingual   Place 1 tablet (0.4 mg total) under the tongue every 5 (five) minutes x 3 doses as needed for chest pain.   25 tablet   12   . omeprazole (PRILOSEC) 20 MG capsule   Oral   Take 1 capsule (20 mg total) by mouth daily.   30 capsule   0   . prasugrel (EFFIENT) 10 MG TABS tablet   Oral   Take 1 tablet (10 mg total) by mouth daily.   30 tablet   0   . rosuvastatin (CRESTOR) 40 MG tablet   Oral   Take 40 mg by mouth daily.         [name] spironolactone (ALDACTONE) 25 MG tablet   Oral   Take 1 tablet (25 mg total) by mouth daily.   30 tablet   0     Allergies Review of patient's allergies indicates no known allergies.  Family History  Problem Relation Age of Onset  . Cancer Mother  Lung  . Cancer Father     Lung  . Diabetes Sister   . Diabetes Brother   . CAD Brother   . Hernia Son     Social History Social History  Substance Use Topics  . Smoking  status: Former Smoker -- 0.00 packs/day for 30 years    Types: Cigarettes    Quit date: 11/30/2014  . Smokeless tobacco: Never Used  . Alcohol Use: Yes     Comment: Rarely, twice a year.     Review of Systems  Constitutional: No fever/chills, fatigue Eyes: No visual changes.  ENT: No sore throat, swelling about the lips/tongue/throat. Cardiovascular: No chest pain. Respiratory: No cough. No shortness of breath. No wheezing.  Gastrointestinal: No abdominal pain.  No nausea, vomiting.   Musculoskeletal: Positive left hand pain.  Skin: Positive redness, swelling left hand. Negative for rash. Neurological: Negative for headaches, focal weakness or numbness. No tingling 10-point ROS otherwise negative.  ____________________________________________   PHYSICAL EXAM:  VITAL SIGNS: ED Triage Vitals  Enc Vitals Group     BP 01/14/16 1205 157/89 mmHg     Pulse Rate 01/14/16 1205 73     Resp 01/14/16 1205 18     Temp 01/14/16 1205 98.3 F (36.8 C)     Temp Source 01/14/16 1205 Oral     SpO2 01/14/16 1205 98 %     Weight 01/14/16 1205 240 lb (108.863 kg)     Height 01/14/16 1205 5\' 9"  (1.753 m)     Head Cir --      Peak Flow --      Pain Score 01/14/16 1205 7     Pain Loc --      Pain Edu? --      Excl. in [place]? --      Constitutional: Alert and oriented. Well appearing and in no acute distress. Eyes: Conjunctivae are normal.  Head: Atraumatic. ENT:      Mouth/Throat: No swelling about the lips, tongue, pharynx. Mucous membranes are moist. Pharynx without erythema, exudates. Uvula is midline and nonswollen. Neck: No stridor. No carotid bruits. Supple with full range of motion. Hematological/Lymphatic/Immunilogical: No cervical lymphadenopathy. Cardiovascular: Normal rate, regular rhythm. Normal S1 and S2.  Good peripheral circulation with 2+ pulses noted in the left upper extremity. Capillary refill is brisk in the left upper extremity. Respiratory: Normal respiratory effort  without tachypnea or retractions. Lungs CTAB with breath sounds noted in all lung fields. Musculoskeletal: Mild edema noted to the back of the left hand. No bony pain to palpation nor evidence of crepitus or bony deformity palpation. Full range of motion of the left fingers, hand, wrist without pain. Neurologic:  Normal speech and language. No gross focal neurologic deficits are appreciated.  Skin:  Skin is warm, dry and intact. No rashes or skin sores are noted about the left hand. There is mild erythema and warmth to palpation to the back of the left hand. Psychiatric: Mood and affect are normal. Speech and behavior are normal. Patient exhibits appropriate insight and judgement.   ____________________________________________   LABS  None  ____________________________________________  EKG  None ____________________________________________  RADIOLOGY I have personally viewed and evaluated these images (plain radiographs) as part of my medical decision making, as well as reviewing the written report by the radiologist.  Dg Hand Complete Left  01/14/2016  CLINICAL DATA:  Insect bite.  Now with soft tissue swelling. EXAM: LEFT HAND - COMPLETE 3+ VIEW COMPARISON:  None. FINDINGS: Diffuse soft tissue swelling  is identified the bone mineralization appears normal. No underlying osseous erosion identified. There is no fracture or subluxation noted. IMPRESSION: 1. No acute bone abnormality. 2. Soft tissue swelling. Electronically Signed   By: Kerby Moors M.D.   On: 01/14/2016 13:00    ____________________________________________    PROCEDURES  Procedure(s) performed: None    Medications  methylPREDNISolone sodium succinate (SOLU-MEDROL) 125 mg/2 mL injection 80 mg (80 mg Intramuscular Given 01/14/16 1323)    Patient tolerated Solu-Medrol injection without side effects ____________________________________________   INITIAL IMPRESSION / ASSESSMENT AND PLAN / ED COURSE  Pertinent  imaging results that were available during my care of the patient were reviewed by me and considered in my medical decision making (see chart for details).  Patient's diagnosis is consistent with cellulitis of left hand due to insect bite. Patient will be discharged home with prescriptions for diphenhydramine to take as directed. Patient is to follow up with his primary care provider if symptoms persist past this treatment course. Patient is given ED precautions to return to the ED for any worsening or new symptoms.      ____________________________________________  FINAL CLINICAL IMPRESSION(S) / ED DIAGNOSES  Final diagnoses:  Cellulitis of left hand excluding fingers and thumb  Insect bite (nonvenomous) of left hand, initial encounter (CODE)      NEW MEDICATIONS STARTED DURING THIS VISIT:  New Prescriptions   DIPHENHYDRAMINE (BENADRYL) 25 MG CAPSULE    Take 1-2 tablets by mouth every 6 hours as needed         Braxton Feathers, PA-C 01/14/16 1409

## 2016-01-14 NOTE — ED Notes (Signed)
See triage note  Having pain to left hand after slapping at something yesterday

## 2016-01-14 NOTE — ED Notes (Signed)
Reports slapping a bug on his arm yesterday, woke up with swelling to left hand

## 2016-01-14 NOTE — Discharge Instructions (Signed)
Insect Bite Mosquitoes, flies, fleas, bedbugs, and many other insects can bite. Insect bites are different from insect stings. A sting is when poison (venom) is injected into the skin. Insect bites can cause pain or itching for a few days, but they are usually not serious. Some insects can spread diseases to people through a bite. SYMPTOMS  Symptoms of an insect bite include:  Itching or pain in the bite area.  Redness and swelling in the bite area.  An open wound (skin ulcer). In many cases, symptoms last for 2-4 days.  DIAGNOSIS  This condition is usually diagnosed based on symptoms and a physical exam. TREATMENT  Treatment is usually not needed for an insect bite. Symptoms often go away on their own. Your health care provider may recommend creams or lotions to help reduce itching. Antibiotic medicines may be prescribed if the bite becomes infected. A tetanus shot may be given in some cases. If you develop an allergic reaction to an insect bite, your health care provider will prescribe medicines to treat the reaction (antihistamines). This is rare. HOME CARE INSTRUCTIONS  Do not scratch the bite area.  Keep the bite area clean and dry. Wash the bite area daily with soap and water as told by your health care provider.  If directed, applyice to the bite area.  Put ice in a plastic bag.  Place a towel between your skin and the bag.  Leave the ice on for 20 minutes, 2-3 times per day.  To help reduce itching and swelling, try applying a baking soda paste, cortisone cream, or calamine lotion to the bite area as told by your health care provider.  Apply or take over-the-counter and prescription medicines only as told by your health care provider.  If you were prescribed an antibiotic medicine, use it as told by your health care provider. Do not stop using the antibiotic even if your condition improves.  Keep all follow-up visits as told by your health care provider. This is  important. PREVENTION   Use insect repellent. The best insect repellents contain:  DEET, picaridin, oil of lemon eucalyptus (OLE), or IR3535.  Higher amounts of an active ingredient.  When you are outdoors, wear clothing that covers your arms and legs.  Avoid opening windows that do not have window screens. SEEK MEDICAL CARE IF:  You have increased redness, swelling, or pain in the bite area.  You have a fever. SEEK IMMEDIATE MEDICAL CARE IF:   You have joint pain.   You have fluid, blood, or pus coming from the bite area.  You have a headache or neck pain.  You have unusual weakness.  You have a rash.  You have chest pain or shortness of breath.  You have abdominal pain, nausea, or vomiting.  You feel unusually tired or sleepy.   This information is not intended to replace advice given to you by your health care provider. Make sure you discuss any questions you have with your health care provider.   Document Released: 09/24/2004 Document Revised: 05/08/2015 Document Reviewed: 01/02/2015 Elsevier Interactive Patient Education 2016 Else[employer]  Cellulitis Cellulitis is an infection of the skin and the tissue beneath it. The infected area is usually red and tender. Cellulitis occurs most often in the arms and lower legs.  CAUSES  Cellulitis is caused by bacteria that enter the skin through cracks or cuts in the skin. The most common types of bacteria that cause cellulitis are staphylococci and streptococci. SIGNS AND SYMPTOMS  Redness and warmth.  Swelling.  Tenderness or pain.  Fever. DIAGNOSIS  Your health care provider can usually determine what is wrong based on a physical exam. Blood tests may also be done. TREATMENT  Treatment usually involves taking an antibiotic medicine. HOME CARE INSTRUCTIONS   Take your antibiotic medicine as directed by your health care provider. Finish the antibiotic even if you start to feel better.  Keep the infected arm  or leg elevated to reduce swelling.  Apply a warm cloth to the affected area up to 4 times per day to relieve pain.  Take medicines only as directed by your health care provider.  Keep all follow-up visits as directed by your health care provider. SEEK MEDICAL CARE IF:   You notice red streaks coming from the infected area.  Your red area gets larger or turns dark in color.  Your bone or joint underneath the infected area becomes painful after the skin has healed.  Your infection returns in the same area or another area.  You notice a swollen bump in the infected area.  You develop new symptoms.  You have a fever. SEEK IMMEDIATE MEDICAL CARE IF:   You feel very sleepy.  You develop vomiting or diarrhea.  You have a general ill feeling (malaise) with muscle aches and pains.   This information is not intended to replace advice given to you by your health care provider. Make sure you discuss any questions you have with your health care provider.   Document Released: 05/27/2005 Document Revised: 05/08/2015 Document Reviewed: 11/02/2011 Elsevier Interactive Patient Education [employer].

## 2016-02-05 ENCOUNTER — Encounter (INDEPENDENT_AMBULATORY_CARE_PROVIDER_SITE_OTHER): Payer: Self-pay

## 2016-02-24 ENCOUNTER — Encounter: Payer: Medicare HMO | Admitting: Pharmacist

## 2016-03-26 ENCOUNTER — Ambulatory Visit: Payer: Self-pay | Admitting: Ophthalmology

## 2016-09-21 ENCOUNTER — Encounter: Payer: Self-pay | Admitting: Cardiology

## 2016-10-13 ENCOUNTER — Other Ambulatory Visit
Admission: RE | Admit: 2016-10-13 | Discharge: 2016-10-13 | Disposition: A | Discharge status: Home / Self Care | Payer: Medicare Other | Source: Ambulatory Visit | Attending: Gastroenterology | Admitting: Gastroenterology

## 2016-10-13 DIAGNOSIS — R197 Diarrhea, unspecified: Secondary | ICD-10-CM | POA: Diagnosis present

## 2016-10-13 LAB — GASTROINTESTINAL PANEL BY PCR, STOOL (REPLACES STOOL CULTURE)

## 2016-10-21 DIAGNOSIS — E7800 Pure hypercholesterolemia, unspecified: Secondary | ICD-10-CM | POA: Insufficient documentation

## 2016-10-21 DIAGNOSIS — I214 Non-ST elevation (NSTEMI) myocardial infarction: Secondary | ICD-10-CM | POA: Insufficient documentation

## 2016-11-13 ENCOUNTER — Inpatient Hospital Stay
Admission: EM | Admit: 2016-11-13 | Discharge: 2016-11-20 | DRG: 291 | Disposition: A | Discharge status: Home / Self Care | Payer: Medicare Other | Attending: Internal Medicine | Admitting: Internal Medicine

## 2016-11-13 ENCOUNTER — Emergency Department: Payer: Medicare Other

## 2016-11-13 ENCOUNTER — Encounter: Payer: Self-pay | Admitting: Emergency Medicine

## 2016-11-13 ENCOUNTER — Observation Stay (HOSPITAL_BASED_OUTPATIENT_CLINIC_OR_DEPARTMENT_OTHER)
Admit: 2016-11-13 | Discharge: 2016-11-13 | Disposition: A | Discharge status: Home / Self Care | Payer: Medicare Other | Attending: Internal Medicine | Admitting: Internal Medicine

## 2016-11-13 DIAGNOSIS — I5031 Acute diastolic (congestive) heart failure: Secondary | ICD-10-CM | POA: Diagnosis not present

## 2016-11-13 DIAGNOSIS — I130 Hypertensive heart and chronic kidney disease with heart failure and stage 1 through stage 4 chronic kidney disease, or unspecified chronic kidney disease: Principal | ICD-10-CM | POA: Diagnosis present

## 2016-11-13 DIAGNOSIS — K219 Gastro-esophageal reflux disease without esophagitis: Secondary | ICD-10-CM | POA: Diagnosis present

## 2016-11-13 DIAGNOSIS — I2510 Atherosclerotic heart disease of native coronary artery without angina pectoris: Secondary | ICD-10-CM | POA: Diagnosis present

## 2016-11-13 DIAGNOSIS — N189 Chronic kidney disease, unspecified: Secondary | ICD-10-CM

## 2016-11-13 DIAGNOSIS — N179 Acute kidney failure, unspecified: Secondary | ICD-10-CM | POA: Diagnosis present

## 2016-11-13 DIAGNOSIS — Z794 Long term (current) use of insulin: Secondary | ICD-10-CM

## 2016-11-13 DIAGNOSIS — N184 Chronic kidney disease, stage 4 (severe): Secondary | ICD-10-CM | POA: Diagnosis present

## 2016-11-13 DIAGNOSIS — Z87891 Personal history of nicotine dependence: Secondary | ICD-10-CM

## 2016-11-13 DIAGNOSIS — E1122 Type 2 diabetes mellitus with diabetic chronic kidney disease: Secondary | ICD-10-CM | POA: Diagnosis present

## 2016-11-13 DIAGNOSIS — I509 Heart failure, unspecified: Secondary | ICD-10-CM | POA: Diagnosis not present

## 2016-11-13 DIAGNOSIS — R809 Proteinuria, unspecified: Secondary | ICD-10-CM | POA: Diagnosis present

## 2016-11-13 DIAGNOSIS — I5020 Unspecified systolic (congestive) heart failure: Secondary | ICD-10-CM

## 2016-11-13 DIAGNOSIS — I252 Old myocardial infarction: Secondary | ICD-10-CM

## 2016-11-13 DIAGNOSIS — Z79899 Other long term (current) drug therapy: Secondary | ICD-10-CM

## 2016-11-13 DIAGNOSIS — D631 Anemia in chronic kidney disease: Secondary | ICD-10-CM | POA: Diagnosis present

## 2016-11-13 DIAGNOSIS — I5033 Acute on chronic diastolic (congestive) heart failure: Secondary | ICD-10-CM | POA: Diagnosis present

## 2016-11-13 DIAGNOSIS — E7800 Pure hypercholesterolemia, unspecified: Secondary | ICD-10-CM | POA: Diagnosis present

## 2016-11-13 DIAGNOSIS — Z7982 Long term (current) use of aspirin: Secondary | ICD-10-CM

## 2016-11-13 LAB — GLUCOSE, CAPILLARY
GLUCOSE-CAPILLARY: 125 mg/dL — AB (ref 65–99)
Glucose-Capillary: 50 mg/dL — ABNORMAL LOW (ref 65–99)
Glucose-Capillary: 107 mg/dL — ABNORMAL HIGH (ref 65–99)

## 2016-11-13 LAB — CBC WITH DIFFERENTIAL/PLATELET
BASOS PCT: 1 %
Basophils Absolute: 0.1 10*3/uL (ref 0–0.1)
EOS ABS: 0.4 10*3/uL (ref 0–0.7)
Eosinophils Relative: 4 %
HCT: 33 % — ABNORMAL LOW (ref 40.0–52.0)
Hemoglobin: 11.3 g/dL — ABNORMAL LOW (ref 13.0–18.0)
LYMPHS ABS: 1.3 10*3/uL (ref 1.0–3.6)
Lymphocytes Relative: 11 %
MCH: 30.7 pg (ref 26.0–34.0)
MCHC: 34.1 g/dL (ref 32.0–36.0)
MCV: 90.1 fL (ref 80.0–100.0)
MONOS PCT: 8 %
Monocytes Absolute: 0.9 10*3/uL (ref 0.2–1.0)
NEUTROS ABS: 8.3 10*3/uL — AB (ref 1.4–6.5)
NEUTROS PCT: 76 %
Platelets: 284 10*3/uL (ref 150–440)
RBC: 3.66 MIL/uL — AB (ref 4.40–5.90)
RDW: 14.1 % (ref 11.5–14.5)
WBC: 11 10*3/uL — AB (ref 3.8–10.6)

## 2016-11-13 LAB — BASIC METABOLIC PANEL
Anion gap: 7 (ref 5–15)
BUN: 27 mg/dL — ABNORMAL HIGH (ref 6–20)
CALCIUM: 7.8 mg/dL — AB (ref 8.9–10.3)
CHLORIDE: 110 mmol/L (ref 101–111)
CO2: 24 mmol/L (ref 22–32)
CREATININE: 3.71 mg/dL — AB (ref 0.61–1.24)
GFR calc non Af Amer: 17 mL/min — ABNORMAL LOW (ref >60)
GFR, EST AFRICAN AMERICAN: 19 mL/min — AB (ref >60)
Glucose, Bld: 161 mg/dL — ABNORMAL HIGH (ref 65–99)
Potassium: 4.3 mmol/L (ref 3.5–5.1)
SODIUM: 141 mmol/L (ref 135–145)

## 2016-11-13 LAB — ECHOCARDIOGRAM COMPLETE
Height: 69 in
WEIGHTICAEL: 3968 [oz_av]

## 2016-11-13 LAB — BRAIN NATRIURETIC PEPTIDE: B NATRIURETIC PEPTIDE 5: 387 pg/mL — AB (ref 0.0–100.0)

## 2016-11-13 LAB — TROPONIN I: TROPONIN I: 0.03 ng/mL — AB (ref <0.03)

## 2016-11-13 MED ORDER — ISOSORB DINITRATE-HYDRALAZINE 20-37.5 MG PO TABS
1.0000 | ORAL_TABLET | Freq: Three times a day (TID) | ORAL | Status: DC
  Administered 2016-11-13 – 2016-11-19 (×18): 1 via ORAL
  Filled 2016-11-13 (×22): qty 1

## 2016-11-13 MED ORDER — FUROSEMIDE 10 MG/ML IJ SOLN
60.0000 mg | Freq: Two times a day (BID) | INTRAMUSCULAR | Status: DC
  Administered 2016-11-13: 60 mg via INTRAVENOUS
  Filled 2016-11-13: qty 6

## 2016-11-13 MED ORDER — PRASUGREL HCL 10 MG PO TABS
10.0000 mg | ORAL_TABLET | Freq: Every day | ORAL | Status: DC
  Administered 2016-11-14 – 2016-11-19 (×6): 10 mg via ORAL
  Filled 2016-11-13 (×7): qty 1

## 2016-11-13 MED ORDER — ROSUVASTATIN CALCIUM 20 MG PO TABS
40.0000 mg | ORAL_TABLET | Freq: Every day | ORAL | Status: DC
  Administered 2016-11-13 – 2016-11-15 (×3): 40 mg via ORAL
  Filled 2016-11-13 (×3): qty 2

## 2016-11-13 MED ORDER — ASPIRIN 81 MG PO CHEW
324.0000 mg | CHEWABLE_TABLET | Freq: Once | ORAL | Status: AC
  Administered 2016-11-13: 324 mg via ORAL
  Filled 2016-11-13: qty 4

## 2016-11-13 MED ORDER — PRASUGREL HCL 10 MG PO TABS
10.0000 mg | ORAL_TABLET | Freq: Once | ORAL | Status: AC
  Administered 2016-11-13: 10 mg via ORAL
  Filled 2016-11-13: qty 1

## 2016-11-13 MED ORDER — ALBUTEROL SULFATE (2.5 MG/3ML) 0.083% IN NEBU: 2.5000 mg | INHALATION_SOLUTION | RESPIRATORY_TRACT | Status: DC | PRN

## 2016-11-13 MED ORDER — BISACODYL 10 MG RE SUPP: 10.0000 mg | Freq: Every day | RECTAL | Status: DC | PRN

## 2016-11-13 MED ORDER — ONDANSETRON HCL 4 MG PO TABS: 4.0000 mg | ORAL_TABLET | Freq: Four times a day (QID) | ORAL | Status: DC | PRN

## 2016-11-13 MED ORDER — ASPIRIN EC 81 MG PO TBEC
81.0000 mg | DELAYED_RELEASE_TABLET | Freq: Every day | ORAL | Status: DC
  Administered 2016-11-14 – 2016-11-19 (×6): 81 mg via ORAL
  Filled 2016-11-13 (×6): qty 1

## 2016-11-13 MED ORDER — FLUOXETINE HCL 20 MG PO CAPS
40.0000 mg | ORAL_CAPSULE | Freq: Every day | ORAL | Status: DC
  Administered 2016-11-13 – 2016-11-19 (×6): 40 mg via ORAL
  Filled 2016-11-13 (×8): qty 2

## 2016-11-13 MED ORDER — LISINOPRIL 20 MG PO TABS
20.0000 mg | ORAL_TABLET | Freq: Once | ORAL | Status: AC
  Administered 2016-11-13: 20 mg via ORAL

## 2016-11-13 MED ORDER — LISINOPRIL 10 MG PO TABS
ORAL_TABLET | ORAL | Status: AC
  Filled 2016-11-13: qty 2

## 2016-11-13 MED ORDER — CILOSTAZOL 100 MG PO TABS
50.0000 mg | ORAL_TABLET | Freq: Two times a day (BID) | ORAL | Status: DC
  Administered 2016-11-13 – 2016-11-19 (×12): 50 mg via ORAL
  Filled 2016-11-13: qty 1
  Filled 2016-11-13: qty 0.5
  Filled 2016-11-13 (×7): qty 1
  Filled 2016-11-13: qty 0.5
  Filled 2016-11-13: qty 1
  Filled 2016-11-13: qty 2
  Filled 2016-11-13: qty 1
  Filled 2016-11-13: qty 2
  Filled 2016-11-13 (×2): qty 1

## 2016-11-13 MED ORDER — HYDROCODONE-ACETAMINOPHEN 5-325 MG PO TABS
1.0000 | ORAL_TABLET | ORAL | Status: DC | PRN
  Administered 2016-11-17: 1 via ORAL
  Filled 2016-11-13: qty 1

## 2016-11-13 MED ORDER — INSULIN ASPART 100 UNIT/ML ~~LOC~~ SOLN: 0.0000 [IU] | Freq: Every day | SUBCUTANEOUS | Status: DC

## 2016-11-13 MED ORDER — LISINOPRIL 10 MG PO TABS
10.0000 mg | ORAL_TABLET | Freq: Every day | ORAL | Status: DC
  Administered 2016-11-14: 10 mg via ORAL
  Filled 2016-11-13: qty 1

## 2016-11-13 MED ORDER — INSULIN ASPART PROT & ASPART (70-30 MIX) 100 UNIT/ML ~~LOC~~ SUSP
40.0000 [IU] | Freq: Two times a day (BID) | SUBCUTANEOUS | Status: DC
  Administered 2016-11-13: 40 [IU] via SUBCUTANEOUS
  Filled 2016-11-13: qty 40

## 2016-11-13 MED ORDER — FUROSEMIDE 10 MG/ML IJ SOLN
40.0000 mg | Freq: Once | INTRAMUSCULAR | Status: AC
  Administered 2016-11-13: 40 mg via INTRAVENOUS
  Filled 2016-11-13: qty 4

## 2016-11-13 MED ORDER — PANTOPRAZOLE SODIUM 40 MG PO TBEC
40.0000 mg | DELAYED_RELEASE_TABLET | Freq: Every day | ORAL | Status: DC
  Administered 2016-11-13 – 2016-11-19 (×7): 40 mg via ORAL
  Filled 2016-11-13 (×7): qty 1

## 2016-11-13 MED ORDER — ACETAMINOPHEN 650 MG RE SUPP: 650.0000 mg | Freq: Four times a day (QID) | RECTAL | Status: DC | PRN

## 2016-11-13 MED ORDER — POLYETHYLENE GLYCOL 3350 17 G PO PACK: 17.0000 g | PACK | Freq: Every day | ORAL | Status: DC | PRN

## 2016-11-13 MED ORDER — HEPARIN SODIUM (PORCINE) 5000 UNIT/ML IJ SOLN
5000.0000 [IU] | Freq: Three times a day (TID) | INTRAMUSCULAR | Status: DC
  Administered 2016-11-13 – 2016-11-19 (×18): 5000 [IU] via SUBCUTANEOUS
  Filled 2016-11-13 (×19): qty 1

## 2016-11-13 MED ORDER — ACETAMINOPHEN 325 MG PO TABS: 650.0000 mg | ORAL_TABLET | Freq: Four times a day (QID) | ORAL | Status: DC | PRN

## 2016-11-13 MED ORDER — CARVEDILOL 12.5 MG PO TABS
25.0000 mg | ORAL_TABLET | Freq: Two times a day (BID) | ORAL | Status: DC
  Administered 2016-11-13 – 2016-11-20 (×14): 25 mg via ORAL
  Filled 2016-11-13 (×2): qty 1
  Filled 2016-11-13: qty 2
  Filled 2016-11-13 (×4): qty 1
  Filled 2016-11-13 (×3): qty 2
  Filled 2016-11-13: qty 1
  Filled 2016-11-13: qty 2
  Filled 2016-11-13 (×2): qty 1

## 2016-11-13 MED ORDER — INSULIN ASPART 100 UNIT/ML ~~LOC~~ SOLN
0.0000 [IU] | Freq: Three times a day (TID) | SUBCUTANEOUS | Status: DC
  Administered 2016-11-13 (×2): 1 [IU] via SUBCUTANEOUS
  Administered 2016-11-14: 2 [IU] via SUBCUTANEOUS
  Administered 2016-11-14: 1 [IU] via SUBCUTANEOUS
  Administered 2016-11-15 – 2016-11-16 (×4): 2 [IU] via SUBCUTANEOUS
  Administered 2016-11-16 – 2016-11-17 (×2): 1 [IU] via SUBCUTANEOUS
  Administered 2016-11-17 – 2016-11-18 (×2): 2 [IU] via SUBCUTANEOUS
  Administered 2016-11-18: 1 [IU] via SUBCUTANEOUS
  Administered 2016-11-18 – 2016-11-20 (×4): 2 [IU] via SUBCUTANEOUS
  Filled 2016-11-13 (×4): qty 2
  Filled 2016-11-13 (×2): qty 1
  Filled 2016-11-13 (×2): qty 2
  Filled 2016-11-13 (×3): qty 1
  Filled 2016-11-13 (×5): qty 2
  Filled 2016-11-13: qty 1
  Filled 2016-11-13: qty 2

## 2016-11-13 MED ORDER — AMLODIPINE BESYLATE 5 MG PO TABS
5.0000 mg | ORAL_TABLET | Freq: Once | ORAL | Status: AC
  Administered 2016-11-13: 5 mg via ORAL
  Filled 2016-11-13: qty 1

## 2016-11-13 MED ORDER — CARVEDILOL 25 MG PO TABS
25.0000 mg | ORAL_TABLET | Freq: Once | ORAL | Status: AC
Start: 2016-11-13 — End: 2016-11-13
  Administered 2016-11-13: 25 mg via ORAL
  Filled 2016-11-13: qty 1

## 2016-11-13 MED ORDER — ONDANSETRON HCL 4 MG/2ML IJ SOLN
4.0000 mg | Freq: Four times a day (QID) | INTRAMUSCULAR | Status: DC | PRN
Start: 2016-11-13 — End: 2016-11-20
  Filled 2016-11-13: qty 2

## 2016-11-13 MED ORDER — AMLODIPINE BESYLATE 10 MG PO TABS
10.0000 mg | ORAL_TABLET | Freq: Every day | ORAL | Status: DC
  Administered 2016-11-14 – 2016-11-17 (×4): 10 mg via ORAL
  Filled 2016-11-13 (×2): qty 2
  Filled 2016-11-13: qty 1
  Filled 2016-11-13 (×2): qty 2

## 2016-11-13 MED ORDER — SODIUM CHLORIDE 0.9% FLUSH
3.0000 mL | Freq: Two times a day (BID) | INTRAVENOUS | Status: DC
  Administered 2016-11-13 – 2016-11-19 (×13): 3 mL via INTRAVENOUS

## 2016-11-13 MED ORDER — NITROGLYCERIN 0.4 MG SL SUBL
0.4000 mg | SUBLINGUAL_TABLET | SUBLINGUAL | Status: DC | PRN
  Administered 2016-11-13: 0.4 mg via SUBLINGUAL
  Filled 2016-11-13: qty 1

## 2016-11-13 NOTE — ED Provider Notes (Signed)
[facility] Emergency Department Provider Note  ____________________________________________  Time seen: Approximately 7:57 AM  I have reviewed the triage vital signs and the nursing notes.   HISTORY  Chief Complaint Chest Pain and Shortness of Breath   HPI Scott [name] is a 56 y.o. male with h/o CAD s/p NSTEMi and stents in 11/2015, hypertension, CKD, anemia, type 2 diabetes, hyperlipidemia, and peripheral vascular disease who presents for evaluation of CP and SOB. Patient reports that he started having a dry cough yesterday and didn't feel well. He did not take any of his medications yesterday. Overnight he started having difficulty breathing which was worse when he was laying in bed. He reports no changes in his weight however patient is unable to tell me what his dry weight should be. He denies fever or chills, congestion, vomiting or diarrhea. She also endorses chest tightness that has been present since earlier this morning, central, constant, nonpleuritic, associated with his shortness of breath. Last stress test done 01/06/2016 which revealed LVEF of 49%, with mild fixed inferior wall defect, with evidence of possible inferior ischemia.      Past Medical History:  Diagnosis Date  . Arthritis    "left arm; right leg" (12/13/2014)  . Asthma   . CHF (congestive heart failure) ([place])   . Chronic disease anemia    [name] 12/13/2014  . Chronic kidney disease (CKD), stage IV (severe) ([place])    [name] 12/13/2014  . Coronary artery disease    [name] 12/13/2014  . Depression   . Dysrhythmia   . GERD (gastroesophageal reflux disease)   . High cholesterol    [name] 12/13/2014  . Hypertension   . Non-Q wave myocardial infarction ([place])    [name] 12/13/2014  . PVD (peripheral vascular disease) ([place])    [name] 12/13/2014  . Type II diabetes mellitus [facility])     Patient Active Problem List   Diagnosis Date Noted  . CHF (congestive heart failure) ([place])  11/13/2016  . Diabetes ([place]) 09/17/2015  . Decreased renal function 05/23/2015  . GERD (gastroesophageal reflux disease) 04/04/2015  . Hypertension 02/26/2015  . Flash pulmonary edema ([place]) 01/09/2015  . Acute systolic congestive heart failure ([place]) 12/13/2014    Past Surgical History:  Procedure Laterality Date  . CARDIAC CATHETERIZATION  11/2014  . INCISION AND DRAINAGE OF WOUND Right ~ 10/2014   "4th toe foot"  . PERCUTANEOUS CORONARY STENT INTERVENTION (PCI-S) N/A 12/14/2014   Procedure: PERCUTANEOUS CORONARY STENT INTERVENTION (PCI-S);  Surgeon: Charolette Forward, MD;  Location: [facility] CATH LAB;  Service: Cardiovascular;  Laterality: N/A;  . PERCUTANEOUS CORONARY STENT INTERVENTION (PCI-S) N/A 12/18/2014   Procedure: PERCUTANEOUS CORONARY STENT INTERVENTION (PCI-S);  Surgeon: Charolette Forward, MD;  Location: [facility] CATH LAB;  Service: Cardiovascular;  Laterality: N/A;  . TOE AMPUTATION Right 2015   4th toe    Prior to Admission medications   Medication Sig Start Date End Date Taking? Authorizing Provider  albuterol (PROVENTIL HFA;VENTOLIN HFA) 108 (90 BASE) MCG/ACT inhaler Inhale 2 puffs into the lungs every 6 (six) hours as needed for wheezing or shortness of breath. 01/10/15  Yes Gladstone Lighter, MD  amLODipine (NORVASC) 10 MG tablet Take 10 mg by mouth daily.    Yes Historical Provider, MD  aspirin EC 81 MG tablet Take 1 tablet (81 mg total) by mouth daily. 01/10/15  Yes Gladstone Lighter, MD  atorvastatin (LIPITOR) 80 MG tablet Take 1 tablet (80 mg total) by mouth daily at 6 PM. 01/10/15  Yes Radhika  [name], MD  Cholecalciferol (VITAMIN D) 2000 units tablet Take 2,000 Units by mouth daily.   Yes Historical Provider, MD  diphenhydrAMINE (BENADRYL) 25 mg capsule Take 1-2 tablets by mouth every 6 hours as needed 01/14/16  Yes Jami L Hagler, PA-C  exenatide (BYETTA) 5 MCG/0.02ML SOPN injection Inject 5 mcg into the skin 2 (two) times daily. 11/05/16  Yes Historical Provider, MD  furosemide (LASIX) 20  MG tablet Take 1 tablet (20 mg total) by mouth daily. 01/10/15  Yes Gladstone Lighter, MD  insulin aspart protamine- aspart (NOVOLOG MIX 70/30) (70-30) 100 UNIT/ML injection Inject 0.35 mLs (35 Units total) into the skin 2 (two) times daily with a meal. Patient taking differently: Inject 40 Units into the skin 2 (two) times daily with a meal.  01/10/15  Yes Gladstone Lighter, MD  lisinopril (PRINIVIL,ZESTRIL) 10 MG tablet Take 10 mg by mouth daily.   Yes Historical Provider, MD  nitroGLYCERIN (NITROSTAT) 0.4 MG SL tablet Place 1 tablet (0.4 mg total) under the tongue every 5 (five) minutes x 3 doses as needed for chest pain. 12/19/14  Yes Charolette Forward, MD  omeprazole (PRILOSEC) 20 MG capsule Take 1 capsule (20 mg total) by mouth daily. 01/10/15  Yes Gladstone Lighter, MD  prasugrel (EFFIENT) 10 MG TABS tablet Take 1 tablet (10 mg total) by mouth daily. 01/10/15  Yes Gladstone Lighter, MD  carvedilol (COREG) 25 MG tablet Take 1 tablet (25 mg total) by mouth 2 (two) times daily with a meal. 01/10/15   Gladstone Lighter, MD  cetirizine (ZYRTEC) 10 MG tablet Take 10 mg by mouth as needed for allergies (1 tablet PRN daily.).    Historical Provider, MD  cilostazol (PLETAL) 50 MG tablet Take 50 mg by mouth 2 (two) times daily.    Historical Provider, MD  ferrous sulfate 325 (65 FE) MG tablet Take 1 tablet (325 mg total) by mouth 3 (three) times daily with meals. Patient not taking: Reported on 11/13/2016 12/19/14   Charolette Forward, MD  FLUoxetine (PROZAC) 40 MG capsule Take 40 mg by mouth at bedtime.    Historical Provider, MD  gabapentin (NEURONTIN) 100 MG capsule Take 1 capsule (100 mg total) by mouth 3 (three) times daily. Patient not taking: Reported on 11/13/2016 11/21/15   Larene Beach A McGowan, PA-C  isosorbide-hydrALAZINE (BIDIL) 20-37.5 MG per tablet Take 1 tablet by mouth 3 (three) times daily. Patient not taking: Reported on 11/13/2016 01/10/15   Gladstone Lighter, MD  Liraglutide (VICTOZA) 18 MG/3ML SOPN Inject  1.8 mg into the skin daily. 0.6mg  daily x 1 week, then 1.2mg  daily one week, then 1.8mg  daily. 07/16/15   Historical Provider, MD  loratadine (CLARITIN) 10 MG tablet Take 1 tablet (10 mg total) by mouth daily as needed for allergies. Patient not taking: Reported on 11/13/2016 01/10/15   Gladstone Lighter, MD  rosuvastatin (CRESTOR) 40 MG tablet Take 40 mg by mouth daily.    Historical Provider, MD  spironolactone (ALDACTONE) 25 MG tablet Take 1 tablet (25 mg total) by mouth daily. Patient not taking: Reported on 11/13/2016 01/10/15   Gladstone Lighter, MD    Allergies Patient has no known allergies.  Family History  Problem Relation Age of Onset  . Cancer Mother     Lung  . Cancer Father     Lung  . Diabetes Sister   . Diabetes Brother   . CAD Brother   . Hernia Son     Social History Social History  Substance Use Topics  . Smoking status:  Former Smoker    Packs/day: 0.00    Years: 30.00    Types: Cigarettes    Quit date: 11/30/2014  . Smokeless tobacco: Never Used  . Alcohol use Yes     Comment: Rarely, twice a year.    Review of Systems  Constitutional: Negative for fever. Eyes: Negative for visual changes. ENT: Negative for sore throat. Neck: No neck pain  Cardiovascular: + chest pain and orthopnea Respiratory: + shortness of breath and cough Gastrointestinal: Negative for abdominal pain, vomiting or diarrhea. Genitourinary: Negative for dysuria. Musculoskeletal: Negative for back pain. Skin: Negative for rash. Neurological: Negative for headaches, weakness or numbness. Psych: No SI or HI  ____________________________________________   PHYSICAL EXAM:  VITAL SIGNS: ED Triage Vitals  Enc Vitals Group     BP 11/13/16 0753 (S) (!) 199/97     Pulse Rate 11/13/16 0753 84     Resp 11/13/16 0753 (!) 28     Temp 11/13/16 0753 98.7 F (37.1 C)     Temp Source 11/13/16 0753 Oral     SpO2 11/13/16 0753 95 %     Weight 11/13/16 0755 248 lb (112.5 kg)     Height  11/13/16 0755 5\' 9"  (1.753 m)     Head Circumference --      Peak Flow --      Pain Score 11/13/16 0755 8     Pain Loc --      Pain Edu? --      Excl. in [place]? --     Constitutional: Alert and oriented. Well appearing and in no apparent distress. HEENT:      Head: Normocephalic and atraumatic.         Eyes: Conjunctivae are normal. Sclera is non-icteric. EOMI. PERRL      Mouth/Throat: Mucous membranes are moist.       Neck: Supple with no signs of meningismus. Cardiovascular: Regular rate and rhythm. No murmurs, gallops, or rubs. 2+ symmetrical distal pulses are present in all extremities. No JVD. Respiratory: Tachypneic. Normal respiratory effort. Lungs are clear to auscultation bilaterally. No wheezes, crackles, or rhonchi.  Gastrointestinal: Soft, non tender, and non distended with positive bowel sounds. No rebound or guarding. Genitourinary: No CVA tenderness. Musculoskeletal: Nontender with normal range of motion in all extremities. No edema, cyanosis, or erythema of extremities. Neurologic: Normal speech and language. Face is symmetric. Moving all extremities. No gross focal neurologic deficits are appreciated. Skin: Skin is warm, dry and intact. No rash noted. Psychiatric: Mood and affect are normal. Speech and behavior are normal.  ____________________________________________   LABS (all labs ordered are listed, but only abnormal results are displayed)  Labs Reviewed  CBC WITH DIFFERENTIAL/PLATELET - Abnormal; Notable for the following:       Result Value   WBC 11.0 (*)    RBC 3.66 (*)    Hemoglobin 11.3 (*)    HCT 33.0 (*)    Neutro Abs 8.3 (*)    All other components within normal limits  BASIC METABOLIC PANEL - Abnormal; Notable for the following:    Glucose, Bld 161 (*)    BUN 27 (*)    Creatinine, Ser 3.71 (*)    Calcium 7.8 (*)    GFR calc non Af Amer 17 (*)    GFR calc Af Amer 19 (*)    All other components within normal limits  BRAIN NATRIURETIC PEPTIDE -  Abnormal; Notable for the following:    B Natriuretic Peptide 387.0 (*)  All other components within normal limits  TROPONIN I - Abnormal; Notable for the following:    Troponin I 0.03 (*)    All other components within normal limits  HEMOGLOBIN A1C   ____________________________________________  EKG  ED ECG REPORT I, Rudene Re, the attending physician, personally viewed and interpreted this ECG.  Sinus rhythm, rate of 84, normal intervals, normal axis, no ST elevations or depressions, flattening T waves on 1 and aVL. Unchanged from prior ____________________________________________  RADIOLOGY  VZD:GLOVFIEPPIRJ and pulmonary venous congestion ____________________________________________   PROCEDURES  Procedure(s) performed: None Procedures Critical Care performed: yes  CRITICAL CARE Performed by: Rudene Re  ?  Total critical care time: 30 min  Critical care time was exclusive of separately billable procedures and treating other patients.  Critical care was necessary to treat or prevent imminent or life-threatening deterioration.  Critical care was time spent personally by me on the following activities: development of treatment plan with patient and/or surrogate as well as nursing, discussions with consultants, evaluation of patient's response to treatment, examination of patient, obtaining history from patient or surrogate, ordering and performing treatments and interventions, ordering and review of laboratory studies, ordering and review of radiographic studies, pulse oximetry and re-evaluation of patient's condition.  ____________________________________________   INITIAL IMPRESSION / ASSESSMENT AND PLAN / ED COURSE   56 y.o. male with h/o CAD s/p NSTEMi and stents in 11/2015, hypertension, CKD, anemia, type 2 diabetes, hyperlipidemia, and peripheral vascular disease who presents for evaluation of SOB, orthopnea and chest tightness that started  overnight. Patient did not take any of his medications for the last 24 hours. Patient is to About normal sats, lungs are clear to auscultation, his severely hypotensive with systolics in the 188C concerning for hypertensive urgency versus emergency with possible pulmonary edema. We'll start with sublingual nitroglycerin, will get chest x-ray, cycle cardiac enzymes, get BNP. We'll restart patient on his home medications. EKG with no ischemic changes. We'll give aspirin.  Clinical Course as of Nov 14 1443  Fri Nov 13, 2016  0905 Patient BP's trending down after nitro and PO meds. Reports SOB is improved and CP resolved after 1 sublingual nitro. Troponin borderline at 0.03. Kidney function worsening with creatinine of 3.71 today. CXR showing cardiomegaly and pulmonary edema. we'll give 40 of IV Lasix and admitted to the hospitalist service. a.  [CV]    Clinical Course User Index [CV] Rudene Re, MD    Pertinent labs & imaging results that were available during my care of the patient were reviewed by me and considered in my medical decision making (see chart for details).    ____________________________________________   FINAL CLINICAL IMPRESSION(S) / ED DIAGNOSES  Final diagnoses:  Acute on chronic congestive heart failure, unspecified congestive heart failure type ([place])  Acute renal failure superimposed on chronic kidney disease, unspecified CKD stage, unspecified acute renal failure type ([place])      NEW MEDICATIONS STARTED DURING THIS VISIT:  Current Discharge Medication List       Note:  This document was prepared using Dragon voice recognition software and may include unintentional dictation errors.    Rudene Re, MD 11/13/16 [phone]

## 2016-11-13 NOTE — Progress Notes (Signed)
CBG 143 per [place] NT. Glucometer not synching

## 2016-11-13 NOTE — ED Triage Notes (Signed)
Shortness of breath and chest discomfort since last night. Passive smoke exposure and prior smoker, Receive PNA vaccine yesterday.  Nauseated this morning. Hx of MI.

## 2016-11-13 NOTE — H&P (Signed)
[facility] NAME: Scott [name]    MR#:  [id number]  DATE OF BIRTH:  Jan 29, 1961  DATE OF ADMISSION:  11/13/2016  PRIMARY CARE PHYSICIAN: Marden Noble, MD   REQUESTING/REFERRING PHYSICIAN: Dr. Alfred Levins  CHIEF COMPLAINT:   Chief Complaint  Patient presents with  . Chest Pain  . Shortness of Breath    HISTORY OF PRESENT ILLNESS:  Scott [name]  is a 56 y.o. male with a known history of diatolic chf, HTN, CKD3 here with sob of 3 weeks with worsening in last 2-3 days. Leg swelling. No chest pain Orthopnea present. Also found to have AKI with creatinine elevated to 3.7  PAST MEDICAL HISTORY:   Past Medical History:  Diagnosis Date  . Arthritis    "left arm; right leg" (12/13/2014)  . Asthma   . CHF (congestive heart failure) ([place])   . Chronic disease anemia    [name] 12/13/2014  . Chronic kidney disease (CKD), stage IV (severe) ([place])    [name] 12/13/2014  . Coronary artery disease    [name] 12/13/2014  . Depression   . Dysrhythmia   . GERD (gastroesophageal reflux disease)   . High cholesterol    [name] 12/13/2014  . Hypertension   . Non-Q wave myocardial infarction ([place])    [name] 12/13/2014  . PVD (peripheral vascular disease) ([place])    [name] 12/13/2014  . Type II diabetes mellitus ([place])     PAST SURGICAL HISTORY:   Past Surgical History:  Procedure Laterality Date  . CARDIAC CATHETERIZATION  11/2014  . INCISION AND DRAINAGE OF WOUND Right ~ 10/2014   "4th toe foot"  . PERCUTANEOUS CORONARY STENT INTERVENTION (PCI-S) N/A 12/14/2014   Procedure: PERCUTANEOUS CORONARY STENT INTERVENTION (PCI-S);  Surgeon: Charolette Forward, MD;  Location: [facility] CATH LAB;  Service: Cardiovascular;  Laterality: N/A;  . PERCUTANEOUS CORONARY STENT INTERVENTION (PCI-S) N/A 12/18/2014   Procedure: PERCUTANEOUS CORONARY STENT INTERVENTION (PCI-S);  Surgeon: Charolette Forward, MD;  Location: [facility] CATH LAB;  Service: Cardiovascular;  Laterality: N/A;  .  TOE AMPUTATION Right 2015   4th toe    SOCIAL HISTORY:   Social History  Substance Use Topics  . Smoking status: Former Smoker    Packs/day: 0.00    Years: 30.00    Types: Cigarettes    Quit date: 11/30/2014  . Smokeless tobacco: Never Used  . Alcohol use Yes     Comment: Rarely, twice a year.    FAMILY HISTORY:   Family History  Problem Relation Age of Onset  . Cancer Mother     Lung  . Cancer Father     Lung  . Diabetes Sister   . Diabetes Brother   . CAD Brother   . Hernia Son     DRUG ALLERGIES:  No Known Allergies  REVIEW OF SYSTEMS:   Review of Systems  Constitutional: Positive for malaise/fatigue. Negative for chills, fever and weight loss.  HENT: Negative for hearing loss and nosebleeds.   Eyes: Negative for blurred vision, double vision and pain.  Respiratory: Positive for cough and shortness of breath. Negative for hemoptysis, sputum production and wheezing.   Cardiovascular: Positive for orthopnea and leg swelling. Negative for chest pain and palpitations.  Gastrointestinal: Negative for abdominal pain, constipation, diarrhea, nausea and vomiting.  Genitourinary: Negative for dysuria and hematuria.  Musculoskeletal: Negative for back pain, falls and myalgias.  Skin: Negative for rash.  Neurological: Positive for weakness. Negative for dizziness, tremors, sensory change,  speech change, focal weakness, seizures and headaches.  Endo/Heme/Allergies: Does not bruise/bleed easily.  Psychiatric/Behavioral: Negative for depression and memory loss. The patient is not nervous/anxious.     MEDICATIONS AT HOME:   Prior to Admission medications   Medication Sig Start Date End Date Taking? Authorizing Provider  albuterol (PROVENTIL HFA;VENTOLIN HFA) 108 (90 BASE) MCG/ACT inhaler Inhale 2 puffs into the lungs every 6 (six) hours as needed for wheezing or shortness of breath. 01/10/15  Yes Gladstone Lighter, MD  amLODipine (NORVASC) 10 MG tablet Take 10 mg by mouth  daily.    Yes Historical Provider, MD  aspirin EC 81 MG tablet Take 1 tablet (81 mg total) by mouth daily. 01/10/15  Yes Gladstone Lighter, MD  atorvastatin (LIPITOR) 80 MG tablet Take 1 tablet (80 mg total) by mouth daily at 6 PM. 01/10/15  Yes Gladstone Lighter, MD  Cholecalciferol (VITAMIN D) 2000 units tablet Take 2,000 Units by mouth daily.   Yes Historical Provider, MD  diphenhydrAMINE (BENADRYL) 25 mg capsule Take 1-2 tablets by mouth every 6 hours as needed 01/14/16  Yes Jami L Hagler, PA-C  exenatide (BYETTA) 5 MCG/0.02ML SOPN injection Inject 5 mcg into the skin 2 (two) times daily. 11/05/16  Yes Historical Provider, MD  furosemide (LASIX) 20 MG tablet Take 1 tablet (20 mg total) by mouth daily. 01/10/15  Yes Gladstone Lighter, MD  insulin aspart protamine- aspart (NOVOLOG MIX 70/30) (70-30) 100 UNIT/ML injection Inject 0.35 mLs (35 Units total) into the skin 2 (two) times daily with a meal. Patient taking differently: Inject 40 Units into the skin 2 (two) times daily with a meal.  01/10/15  Yes Gladstone Lighter, MD  lisinopril (PRINIVIL,ZESTRIL) 10 MG tablet Take 10 mg by mouth daily.   Yes Historical Provider, MD  nitroGLYCERIN (NITROSTAT) 0.4 MG SL tablet Place 1 tablet (0.4 mg total) under the tongue every 5 (five) minutes x 3 doses as needed for chest pain. 12/19/14  Yes Charolette Forward, MD  omeprazole (PRILOSEC) 20 MG capsule Take 1 capsule (20 mg total) by mouth daily. 01/10/15  Yes Gladstone Lighter, MD  prasugrel (EFFIENT) 10 MG TABS tablet Take 1 tablet (10 mg total) by mouth daily. 01/10/15  Yes Gladstone Lighter, MD  carvedilol (COREG) 25 MG tablet Take 1 tablet (25 mg total) by mouth 2 (two) times daily with a meal. 01/10/15   Gladstone Lighter, MD  cetirizine (ZYRTEC) 10 MG tablet Take 10 mg by mouth as needed for allergies (1 tablet PRN daily.).    Historical Provider, MD  cilostazol (PLETAL) 50 MG tablet Take 50 mg by mouth 2 (two) times daily.    Historical Provider, MD  ferrous  sulfate 325 (65 FE) MG tablet Take 1 tablet (325 mg total) by mouth 3 (three) times daily with meals. Patient not taking: Reported on 11/13/2016 12/19/14   Charolette Forward, MD  FLUoxetine (PROZAC) 40 MG capsule Take 40 mg by mouth at bedtime.    Historical Provider, MD  gabapentin (NEURONTIN) 100 MG capsule Take 1 capsule (100 mg total) by mouth 3 (three) times daily. Patient not taking: Reported on 11/13/2016 11/21/15   Larene Beach A McGowan, PA-C  isosorbide-hydrALAZINE (BIDIL) 20-37.5 MG per tablet Take 1 tablet by mouth 3 (three) times daily. Patient not taking: Reported on 11/13/2016 01/10/15   Gladstone Lighter, MD  Liraglutide (VICTOZA) 18 MG/3ML SOPN Inject 1.8 mg into the skin daily. 0.6mg  daily x 1 week, then 1.2mg  daily one week, then 1.8mg  daily. 07/16/15   Historical Provider, MD  loratadine (CLARITIN) 10 MG tablet Take 1 tablet (10 mg total) by mouth daily as needed for allergies. Patient not taking: Reported on 11/13/2016 01/10/15   Gladstone Lighter, MD  rosuvastatin (CRESTOR) 40 MG tablet Take 40 mg by mouth daily.    Historical Provider, MD  spironolactone (ALDACTONE) 25 MG tablet Take 1 tablet (25 mg total) by mouth daily. Patient not taking: Reported on 11/13/2016 01/10/15   Gladstone Lighter, MD     VITAL SIGNS:  Blood pressure (!) 182/104, pulse 78, temperature 98.7 F (37.1 C), temperature source Oral, resp. rate 20, height 5\' 9"  (1.753 m), weight 112.5 kg (248 lb), SpO2 94 %.  PHYSICAL EXAMINATION:  Physical Exam  GENERAL:  56 y.o.-year-old patient lying in the bed with no acute distress. Obese EYES: Pupils equal, round, reactive to light and accommodation. No scleral icterus. Extraocular muscles intact.  HEENT: Head atraumatic, normocephalic. Oropharynx and nasopharynx clear. No oropharyngeal erythema, moist oral mucosa  NECK:  Supple, no jugular venous distention. No thyroid enlargement, no tenderness.  LUNGS: Normal breath sounds bilaterally, no wheezing, rales, rhonchi. No use  of accessory muscles of respiration.  CARDIOVASCULAR: S1, S2 normal. No murmurs, rubs, or gallops.  ABDOMEN: Soft, nontender, nondistended. Bowel sounds present. No organomegaly or mass.  EXTREMITIES: No pedal edema, cyanosis, or clubbing. + 2 pedal & radial pulses b/l.   NEUROLOGIC: Cranial nerves II through XII are intact. No focal Motor or sensory deficits appreciated b/l PSYCHIATRIC: The patient is alert and oriented x 3. Good affect.  SKIN: No obvious rash, lesion, or ulcer.   LABORATORY PANEL:   CBC  Recent Labs Lab 11/13/16 0804  WBC 11.0*  HGB 11.3*  HCT 33.0*  PLT 284   ------------------------------------------------------------------------------------------------------------------  Chemistries   Recent Labs Lab 11/13/16 0804  NA 141  K 4.3  CL 110  CO2 24  GLUCOSE 161*  BUN 27*  CREATININE 3.71*  CALCIUM 7.8*   ------------------------------------------------------------------------------------------------------------------  Cardiac Enzymes  Recent Labs Lab 11/13/16 0804  TROPONINI 0.03*   ------------------------------------------------------------------------------------------------------------------  RADIOLOGY:  Dg Chest Portable 1 View  Result Date: 11/13/2016 CLINICAL DATA:  Chest pain and shortness of breath EXAM: PORTABLE CHEST 1 VIEW COMPARISON:  01/10/2015 FINDINGS: Cardiomegaly. Stable aortic and hilar contours. Pulmonary vascular congestion without Kerley lines, effusion, or air bronchogram. No pneumothorax. IMPRESSION: Cardiomegaly and pulmonary venous congestion. Electronically Signed   By: Monte Fantasia M.D.   On: 11/13/2016 08:33     IMPRESSION AND PLAN:   * Acute on chronic diastolic chf - IV Lasix, Beta blockers - Input and Output - Counseled to limit fluids and Salt - Monitor Bun/Cr and Potassium - Echo -Cardiology follow up after discharge  * AKI over CKD3 Likely cardiorenal syndrome Monitor with diuresis Repeat labs  in AM [facility] nephrology  * Accelerated Hypertension Continue home meds  * DVT prophylaxis Heparin   All the records are reviewed and case discussed with ED provider. Management plans discussed with the patient, family and they are in agreement.  CODE STATUS: FULL CODE  TOTAL TIME TAKING CARE OF THIS PATIENT: 40 minutes.   Hillary Bow R M.D on 11/13/2016 at 10:17 AM  Between 7am to 6pm - Pager - [phone]  After 6pm go to [email or web address] - password [email or web address] [place] [facility]  Office  [phone]  CC: Primary care physician; Marden Noble, MD  Note: This dictation was prepared with Dragon dictation along with smaller phrase technology. Any transcriptional errors that result from this process are  unintentional.

## 2016-11-13 NOTE — Progress Notes (Signed)
*  PRELIMINARY RESULTS* Echocardiogram 2D Echocardiogram has been performed.  Scott [name] 11/13/2016, 2:57 PM

## 2016-11-13 NOTE — Progress Notes (Signed)
Patient admitted to unit. Oriented to room, call bell, and staff. Bed in lowest position. Fall safety plan reviewed. Full assessment to Epic. Skin assessment verified with Particia Nearing RN. Telemetry box verification with tele clerk and Jamie NT- Box#: [id number]. Will continue to monitor.

## 2016-11-13 NOTE — Progress Notes (Signed)
Bedtime CBG=55 mg/dL . Pt. was asymptomatic  and talkative when the RN assessed on him. Regular coke and gram crackers offered and on rechecked, the CBG was 107 mg/dL. No acute distress noted. Will continue to monitor.

## 2016-11-14 LAB — BASIC METABOLIC PANEL
Anion gap: 6 (ref 5–15)
BUN: 32 mg/dL — AB (ref 6–20)
CO2: 24 mmol/L (ref 22–32)
Calcium: 7.6 mg/dL — ABNORMAL LOW (ref 8.9–10.3)
Chloride: 109 mmol/L (ref 101–111)
Creatinine, Ser: 4.18 mg/dL — ABNORMAL HIGH (ref 0.61–1.24)
GFR calc Af Amer: 17 mL/min — ABNORMAL LOW (ref >60)
GFR, EST NON AFRICAN AMERICAN: 14 mL/min — AB (ref >60)
GLUCOSE: 119 mg/dL — AB (ref 65–99)
POTASSIUM: 4 mmol/L (ref 3.5–5.1)
Sodium: 139 mmol/L (ref 135–145)

## 2016-11-14 LAB — HEMOGLOBIN A1C
Hgb A1c MFr Bld: 7 % — ABNORMAL HIGH (ref 4.8–5.6)
Mean Plasma Glucose: 154 mg/dL

## 2016-11-14 LAB — CBC
HEMATOCRIT: 27.9 % — AB (ref 40.0–52.0)
Hemoglobin: 9.6 g/dL — ABNORMAL LOW (ref 13.0–18.0)
MCH: 30.8 pg (ref 26.0–34.0)
MCHC: 34.4 g/dL (ref 32.0–36.0)
MCV: 89.4 fL (ref 80.0–100.0)
Platelets: 251 10*3/uL (ref 150–440)
RBC: 3.12 MIL/uL — ABNORMAL LOW (ref 4.40–5.90)
RDW: 14.1 % (ref 11.5–14.5)
WBC: 7.1 10*3/uL (ref 3.8–10.6)

## 2016-11-14 LAB — GLUCOSE, CAPILLARY
GLUCOSE-CAPILLARY: 183 mg/dL — AB (ref 65–99)
GLUCOSE-CAPILLARY: 56 mg/dL — AB (ref 65–99)
GLUCOSE-CAPILLARY: 99 mg/dL (ref 65–99)
Glucose-Capillary: 113 mg/dL — ABNORMAL HIGH (ref 65–99)
Glucose-Capillary: 129 mg/dL — ABNORMAL HIGH (ref 65–99)

## 2016-11-14 MED ORDER — INSULIN ASPART PROT & ASPART (70-30 MIX) 100 UNIT/ML ~~LOC~~ SUSP
35.0000 [IU] | Freq: Two times a day (BID) | SUBCUTANEOUS | Status: DC
  Administered 2016-11-14 – 2016-11-15 (×2): 35 [IU] via SUBCUTANEOUS
  Filled 2016-11-14 (×2): qty 35

## 2016-11-14 NOTE — Progress Notes (Signed)
[facility] [place] NAME: Scott [name]    MR#:  [id number]  DATE OF BIRTH:  07/14/1961  SUBJECTIVE:  CHIEF COMPLAINT:   Chief Complaint  Patient presents with  . Chest Pain  . Shortness of Breath   SOB better. No orthopnea No chest pain  REVIEW OF SYSTEMS:    Review of Systems  Constitutional: Negative for chills and fever.  HENT: Negative for sore throat.   Eyes: Negative for blurred vision, double vision and pain.  Respiratory: Negative for cough, hemoptysis, shortness of breath and wheezing.   Cardiovascular: Negative for chest pain, palpitations, orthopnea and leg swelling.  Gastrointestinal: Negative for abdominal pain, constipation, diarrhea, heartburn, nausea and vomiting.  Genitourinary: Negative for dysuria and hematuria.  Musculoskeletal: Negative for back pain and joint pain.  Skin: Negative for rash.  Neurological: Negative for sensory change, speech change, focal weakness and headaches.  Endo/Heme/Allergies: Does not bruise/bleed easily.  Psychiatric/Behavioral: Negative for depression. The patient is not nervous/anxious.     DRUG ALLERGIES:  No Known Allergies  VITALS:  Blood pressure (!) 152/84, pulse 73, temperature 98.1 F (36.7 C), temperature source Oral, resp. rate 16, height 5\' 9"  (1.753 m), weight 107.4 kg (236 lb 12.8 oz), SpO2 98 %.  PHYSICAL EXAMINATION:   Physical Exam  GENERAL:  56 y.o.-year-old patient lying in the bed with no acute distress.  EYES: Pupils equal, round, reactive to light and accommodation. No scleral icterus. Extraocular muscles intact.  HEENT: Head atraumatic, normocephalic. Oropharynx and nasopharynx clear.  NECK:  Supple, no jugular venous distention. No thyroid enlargement, no tenderness.  LUNGS: Normal breath sounds bilaterally, no wheezing, rales, rhonchi. No use of accessory muscles of respiration.  CARDIOVASCULAR: S1, S2 normal. No murmurs, rubs, or gallops.  ABDOMEN:  Soft, nontender, nondistended. Bowel sounds present. No organomegaly or mass.  EXTREMITIES: No cyanosis, clubbing or edema b/l.    NEUROLOGIC: Cranial nerves II through XII are intact. No focal Motor or sensory deficits b/l.   PSYCHIATRIC: The patient is alert and oriented x 3.  SKIN: No obvious rash, lesion, or ulcer.   LABORATORY PANEL:   CBC  Recent Labs Lab 11/14/16 0538  WBC 7.1  HGB 9.6*  HCT 27.9*  PLT 251   ------------------------------------------------------------------------------------------------------------------ Chemistries   Recent Labs Lab 11/14/16 0538  NA 139  K 4.0  CL 109  CO2 24  GLUCOSE 119*  BUN 32*  CREATININE 4.18*  CALCIUM 7.6*   ------------------------------------------------------------------------------------------------------------------  Cardiac Enzymes  Recent Labs Lab 11/13/16 0804  TROPONINI 0.03*   ------------------------------------------------------------------------------------------------------------------  RADIOLOGY:  Dg Chest Portable 1 View  Result Date: 11/13/2016 CLINICAL DATA:  Chest pain and shortness of breath EXAM: PORTABLE CHEST 1 VIEW COMPARISON:  01/10/2015 FINDINGS: Cardiomegaly. Stable aortic and hilar contours. Pulmonary vascular congestion without Kerley lines, effusion, or air bronchogram. No pneumothorax. IMPRESSION: Cardiomegaly and pulmonary venous congestion. Electronically Signed   By: Monte Fantasia M.D.   On: 11/13/2016 08:33     ASSESSMENT AND PLAN:   * Acute on chronic diastolic chf - IV Lasix, Beta blockers. Stop Lasix due to worsening creatinine. - Input and Output - Counseled to limit fluids and Salt - Monitor Bun/Cr and Potassium - Echo repeated and ejection fraction 60%. No significant valvular abnormalities. -Cardiology follow up after discharge  * AKI over CKD3 Initially thought to be cardiorenal syndrome. No creatinine is worsening with diuresis. We'll hold Lasix. Monitor  input and output. [facility] consult nephrology for further input.  *  Insulin dependent diabetes mellitus. Continue patient's home dose of insulin with reduced dose. Sliding scale insulin.  * Accelerated Hypertension - improved Continue home meds  * DVT prophylaxis Heparin  All the records are reviewed and case discussed with Care Management/Social Workerr. Management plans discussed with the patient, family and they are in agreement.  CODE STATUS: FULL CODE  DVT Prophylaxis: SCDs  TOTAL TIME TAKING CARE OF THIS PATIENT: 35 minutes.   POSSIBLE D/C IN 1-2 DAYS, DEPENDING ON CLINICAL CONDITION.  Hillary Bow R M.D on 11/14/2016 at 10:48 AM  Between 7am to 6pm - Pager - [phone]  After 6pm go to [email or web address] - password [email or web address] [place] [facility]  Office  [phone]  CC: Primary care physician; Marden Noble, MD  Note: This dictation was prepared with Dragon dictation along with smaller phrase technology. Any transcriptional errors that result from this process are unintentional.

## 2016-11-14 NOTE — Progress Notes (Signed)
[facility]  ROUNDING NOTE   Subjective:  Patient known to [place] from prior admissions. He follows with [facility] nephrology. We were consulted for the evaluation management of acute renal failure. His creatinine is currently 4.18. Baseline creatinine appears to be 2.7 however this was on 03/11/2016. He presents now with increasing shortness of breath over the past several weeks. He's had associated lower extremity edema as well.   Objective:  Vital signs in last 24 hours:  Temp:  [97.9 F (36.6 C)-98.4 F (36.9 C)] 97.9 F (36.6 C) (03/17 1140) Pulse Rate:  [67-73] 72 (03/17 1140) Resp:  [15-18] 18 (03/17 1140) BP: (115-174)/(57-84) 115/57 (03/17 1140) SpO2:  [95 %-98 %] 96 % (03/17 1140) Weight:  [107.4 kg (236 lb 12.8 oz)] 107.4 kg (236 lb 12.8 oz) (03/17 0543)  Weight change:  Filed Weights   11/13/16 0755 11/14/16 0543  Weight: 112.5 kg (248 lb) 107.4 kg (236 lb 12.8 oz)    Intake/Output: I/O last 3 completed shifts: In: 44 [P.O.:720] Out: 1700 [Urine:1700]   Intake/Output this shift:  Total I/O In: 600 [P.O.:600] Out: 400 [Urine:400]  Physical Exam: General: No acute distress  Head: Normocephalic, atraumatic. Moist oral mucosal membranes  Eyes: Anicteric  Neck: Supple, trachea midline  Lungs:  Clear to auscultation, normal effort  Heart: S1S2 no rubs  Abdomen:  Soft, nontender, bowel sounds present   Extremities: trace peripheral edema.  Neurologic: Nonfocal, moving all four extremities  Skin: No lesions       Basic Metabolic Panel:  Recent Labs Lab 11/13/16 0804 11/14/16 0538  NA 141 139  K 4.3 4.0  CL 110 109  CO2 24 24  GLUCOSE 161* 119*  BUN 27* 32*  CREATININE 3.71* 4.18*  CALCIUM 7.8* 7.6*    Liver Function Tests: No results for input(s): AST, ALT, ALKPHOS, BILITOT, PROT, ALBUMIN in the last 168 hours. No results for input(s): LIPASE, AMYLASE in the last 168 hours. No results for input(s): AMMONIA in the last 168  hours.  CBC:  Recent Labs Lab 11/13/16 0804 11/14/16 0538  WBC 11.0* 7.1  NEUTROABS 8.3*  --   HGB 11.3* 9.6*  HCT 33.0* 27.9*  MCV 90.1 89.4  PLT 284 251    Cardiac Enzymes:  Recent Labs Lab 11/13/16 0804  TROPONINI 0.03*    BNP: Invalid input(s): POCBNP  CBG:  Recent Labs Lab 11/13/16 2059 11/13/16 2144 11/14/16 0733 11/14/16 1140 11/14/16 1644  GLUCAP 50* 107* 113* 183* 129*    Microbiology: Results for orders placed or performed during the hospital encounter of 10/13/16  Gastrointestinal Panel by PCR , Stool     Status: None   Collection Time: 10/13/16 10:00 AM  Result Value Ref Range Status   Campylobacter species NOT DETECTED NOT DETECTED Final   Plesimonas shigelloides NOT DETECTED NOT DETECTED Final   Salmonella species NOT DETECTED NOT DETECTED Final   Yersinia enterocolitica NOT DETECTED NOT DETECTED Final   Vibrio species NOT DETECTED NOT DETECTED Final   Vibrio cholerae NOT DETECTED NOT DETECTED Final   Enteroaggregative E coli (EAEC) NOT DETECTED NOT DETECTED Final   Enteropathogenic E coli (EPEC) NOT DETECTED NOT DETECTED Final   Enterotoxigenic E coli (ETEC) NOT DETECTED NOT DETECTED Final   Shiga like toxin producing E coli (STEC) NOT DETECTED NOT DETECTED Final   Shigella/Enteroinvasive E coli (EIEC) NOT DETECTED NOT DETECTED Final   Cryptosporidium NOT DETECTED NOT DETECTED Final   Cyclospora cayetanensis NOT DETECTED NOT DETECTED Final   Entamoeba histolytica  NOT DETECTED NOT DETECTED Final   Giardia lamblia NOT DETECTED NOT DETECTED Final   Adenovirus F40/41 NOT DETECTED NOT DETECTED Final   Astrovirus NOT DETECTED NOT DETECTED Final   Norovirus GI/GII NOT DETECTED NOT DETECTED Final   Rotavirus A NOT DETECTED NOT DETECTED Final   Sapovirus (I, II, IV, and V) NOT DETECTED NOT DETECTED Final    Coagulation Studies: No results for input(s): LABPROT, INR in the last 72 hours.  Urinalysis: No results for input(s): COLORURINE,  LABSPEC, PHURINE, GLUCOSEU, HGBUR, BILIRUBINUR, KETONESUR, PROTEINUR, UROBILINOGEN, NITRITE, LEUKOCYTESUR in the last 72 hours.  Invalid input(s): APPERANCEUR    Imaging: Dg Chest Portable 1 View  Result Date: 11/13/2016 CLINICAL DATA:  Chest pain and shortness of breath EXAM: PORTABLE CHEST 1 VIEW COMPARISON:  01/10/2015 FINDINGS: Cardiomegaly. Stable aortic and hilar contours. Pulmonary vascular congestion without Kerley lines, effusion, or air bronchogram. No pneumothorax. IMPRESSION: Cardiomegaly and pulmonary venous congestion. Electronically Signed   By: Monte Fantasia M.D.   On: 11/13/2016 08:33     Medications:    . amLODipine  10 mg Oral Daily  . aspirin EC  81 mg Oral Daily  . carvedilol  25 mg Oral BID WC  . cilostazol  50 mg Oral BID  . FLUoxetine  40 mg Oral QHS  . heparin  5,000 Units Subcutaneous Q8H  . insulin aspart  0-5 Units Subcutaneous QHS  . insulin aspart  0-9 Units Subcutaneous TID WC  . insulin aspart protamine- aspart  35 Units Subcutaneous BID WC  . isosorbide-hydrALAZINE  1 tablet Oral TID  . lisinopril  10 mg Oral Daily  . pantoprazole  40 mg Oral Daily  . prasugrel  10 mg Oral Daily  . rosuvastatin  40 mg Oral q1800  . sodium chloride flush  3 mL Intravenous Q12H   acetaminophen **OR** acetaminophen, albuterol, bisacodyl, HYDROcodone-acetaminophen, nitroGLYCERIN, ondansetron **OR** ondansetron (ZOFRAN) IV, polyethylene glycol  Assessment/ Plan:  56 y.o. male with past medical history of congestive heart failure with normal ejection fraction of 60%, chronic kidney disease stage IV baseline creatinine 2.7, EGFR 24, GERD, hypertension, peripheral vascular disease, diabetes mellitus type 2  1. Acute renal failure/chronic kidney disease stage IV baseline creatinine 2.7 with EGFR 24/proteinuria/diabetes mellitus type 2. It is been sometime since the patient has had renal follow-up. It appears he saw [facility] nephrology in July 2017 but has not had any further  follow-up. Renal function worse at the moment. Agree with holding diuretics. Hold off on IV fluids for now as there was pulmonary vascular congestion on chest x-ray.  We will also discontinue lisinopril at this time.  2. Anemia of chronic kidney disease. Hemoglobin has dropped to 9.6. No urgent indication for Epogen however we will need to continue to monitor CBC.  3. Hypertension. As above we are discontinuing lisinopril.  Continue amlodipine and carvedilol.   LOS: 0 Wilkie Zenon 3/17/20185:28 PM

## 2016-11-14 NOTE — Care Management Obs Status (Signed)
[place] NOTIFICATION   Patient Details  Name: ASKARI KINLEY MRN: [id number] Date of Birth: [birth year]   Medicare Observation Status Notification Given:  Yes    Shaley Leavens A, RN 11/14/2016, 4:20 PM

## 2016-11-14 NOTE — Progress Notes (Signed)
Patient CBG was noted as 56. Nurse observed patient to be alert and oriented X4.  Patient given peanut butter, graham crackers, and [employer]. CBG rechecked at 2239 and blood sugar was 99.

## 2016-11-15 DIAGNOSIS — R809 Proteinuria, unspecified: Secondary | ICD-10-CM | POA: Diagnosis present

## 2016-11-15 DIAGNOSIS — K219 Gastro-esophageal reflux disease without esophagitis: Secondary | ICD-10-CM | POA: Diagnosis present

## 2016-11-15 DIAGNOSIS — N179 Acute kidney failure, unspecified: Secondary | ICD-10-CM | POA: Diagnosis present

## 2016-11-15 DIAGNOSIS — D631 Anemia in chronic kidney disease: Secondary | ICD-10-CM | POA: Diagnosis present

## 2016-11-15 DIAGNOSIS — I2510 Atherosclerotic heart disease of native coronary artery without angina pectoris: Secondary | ICD-10-CM | POA: Diagnosis present

## 2016-11-15 DIAGNOSIS — Z794 Long term (current) use of insulin: Secondary | ICD-10-CM | POA: Diagnosis not present

## 2016-11-15 DIAGNOSIS — E1122 Type 2 diabetes mellitus with diabetic chronic kidney disease: Secondary | ICD-10-CM | POA: Diagnosis present

## 2016-11-15 DIAGNOSIS — I252 Old myocardial infarction: Secondary | ICD-10-CM | POA: Diagnosis not present

## 2016-11-15 DIAGNOSIS — Z7982 Long term (current) use of aspirin: Secondary | ICD-10-CM | POA: Diagnosis not present

## 2016-11-15 DIAGNOSIS — E7800 Pure hypercholesterolemia, unspecified: Secondary | ICD-10-CM | POA: Diagnosis present

## 2016-11-15 DIAGNOSIS — I130 Hypertensive heart and chronic kidney disease with heart failure and stage 1 through stage 4 chronic kidney disease, or unspecified chronic kidney disease: Secondary | ICD-10-CM | POA: Diagnosis present

## 2016-11-15 DIAGNOSIS — I509 Heart failure, unspecified: Secondary | ICD-10-CM | POA: Diagnosis present

## 2016-11-15 DIAGNOSIS — Z87891 Personal history of nicotine dependence: Secondary | ICD-10-CM | POA: Diagnosis not present

## 2016-11-15 DIAGNOSIS — Z79899 Other long term (current) drug therapy: Secondary | ICD-10-CM | POA: Diagnosis not present

## 2016-11-15 DIAGNOSIS — I5033 Acute on chronic diastolic (congestive) heart failure: Secondary | ICD-10-CM | POA: Diagnosis present

## 2016-11-15 DIAGNOSIS — N184 Chronic kidney disease, stage 4 (severe): Secondary | ICD-10-CM | POA: Diagnosis present

## 2016-11-15 LAB — BASIC METABOLIC PANEL
Anion gap: 8 (ref 5–15)
BUN: 40 mg/dL — AB (ref 6–20)
CHLORIDE: 108 mmol/L (ref 101–111)
CO2: 23 mmol/L (ref 22–32)
Calcium: 7.4 mg/dL — ABNORMAL LOW (ref 8.9–10.3)
Creatinine, Ser: 4.75 mg/dL — ABNORMAL HIGH (ref 0.61–1.24)
GFR calc Af Amer: 14 mL/min — ABNORMAL LOW (ref >60)
GFR calc non Af Amer: 12 mL/min — ABNORMAL LOW (ref >60)
Glucose, Bld: 191 mg/dL — ABNORMAL HIGH (ref 65–99)
POTASSIUM: 4 mmol/L (ref 3.5–5.1)
Sodium: 139 mmol/L (ref 135–145)

## 2016-11-15 LAB — GLUCOSE, CAPILLARY
GLUCOSE-CAPILLARY: 172 mg/dL — AB (ref 65–99)
GLUCOSE-CAPILLARY: 146 mg/dL — AB (ref 65–99)
Glucose-Capillary: 184 mg/dL — ABNORMAL HIGH (ref 65–99)
Glucose-Capillary: 155 mg/dL — ABNORMAL HIGH (ref 65–99)

## 2016-11-15 MED ORDER — MENTHOL 3 MG MT LOZG
1.0000 | LOZENGE | OROMUCOSAL | Status: DC | PRN
  Filled 2016-11-15: qty 9

## 2016-11-15 MED ORDER — ALUM & MAG HYDROXIDE-SIMETH 200-200-20 MG/5ML PO SUSP
30.0000 mL | ORAL | Status: DC | PRN
  Administered 2016-11-15 – 2016-11-18 (×2): 30 mL via ORAL
  Filled 2016-11-15 (×2): qty 30

## 2016-11-15 MED ORDER — INSULIN ASPART PROT & ASPART (70-30 MIX) 100 UNIT/ML ~~LOC~~ SUSP
30.0000 [IU] | Freq: Two times a day (BID) | SUBCUTANEOUS | Status: DC
  Administered 2016-11-15 – 2016-11-19 (×7): 30 [IU] via SUBCUTANEOUS
  Filled 2016-11-15 (×8): qty 30

## 2016-11-15 MED ORDER — SODIUM CHLORIDE 0.9 % IV SOLN
INTRAVENOUS | Status: DC
  Administered 2016-11-15 – 2016-11-16 (×2): via INTRAVENOUS

## 2016-11-15 NOTE — Progress Notes (Signed)
[facility]  ROUNDING NOTE   Subjective:  Cr up to 4.75 today.  UOP appears improved today.  Resting comfortably in bed.   Objective:  Vital signs in last 24 hours:  Temp:  [97.9 F (36.6 C)-98.3 F (36.8 C)] 97.9 F (36.6 C) (03/18 2033) Pulse Rate:  [68-73] 68 (03/18 2033) Resp:  [12-18] 16 (03/18 2033) BP: (102-152)/(51-85) 119/67 (03/18 2033) SpO2:  [96 %-97 %] 97 % (03/18 2033) Weight:  [108.5 kg (239 lb 1.6 oz)] 108.5 kg (239 lb 1.6 oz) (03/18 0521)  Weight change: -4.037 kg (-8 lb 14.4 oz) Filed Weights   11/13/16 0755 11/14/16 0543 11/15/16 0521  Weight: 112.5 kg (248 lb) 107.4 kg (236 lb 12.8 oz) 108.5 kg (239 lb 1.6 oz)    Intake/Output: I/O last 3 completed shifts: In: 1637.5 [P.O.:1320; I.V.:317.5] Out: 1875 [Urine:1875]   Intake/Output this shift:  Total I/O In: -  Out: 300 [Urine:300]  Physical Exam: General: No acute distress  Head: Normocephalic, atraumatic. Moist oral mucosal membranes  Eyes: Anicteric  Neck: Supple, trachea midline  Lungs:  Clear to auscultation, normal effort  Heart: S1S2 no rubs  Abdomen:  Soft, nontender, bowel sounds present   Extremities: trace peripheral edema.  Neurologic: Nonfocal, moving all four extremities  Skin: No lesions       Basic Metabolic Panel:  Recent Labs Lab 11/13/16 0804 11/14/16 0538 11/15/16 0622  NA 141 139 139  K 4.3 4.0 4.0  CL 110 109 108  CO2 '24 24 23  ' GLUCOSE 161* 119* 191*  BUN 27* 32* 40*  CREATININE 3.71* 4.18* 4.75*  CALCIUM 7.8* 7.6* 7.4*    Liver Function Tests: No results for input(s): AST, ALT, ALKPHOS, BILITOT, PROT, ALBUMIN in the last 168 hours. No results for input(s): LIPASE, AMYLASE in the last 168 hours. No results for input(s): AMMONIA in the last 168 hours.  CBC:  Recent Labs Lab 11/13/16 0804 11/14/16 0538  WBC 11.0* 7.1  NEUTROABS 8.3*  --   HGB 11.3* 9.6*  HCT 33.0* 27.9*  MCV 90.1 89.4  PLT 284 251    Cardiac Enzymes:  Recent  Labs Lab 11/13/16 0804  TROPONINI 0.03*    BNP: Invalid input(s): POCBNP  CBG:  Recent Labs Lab 11/14/16 2133 11/14/16 2239 11/15/16 0745 11/15/16 1136 11/15/16 1645  GLUCAP 56* 99 172* 184* 155*    Microbiology: Results for orders placed or performed during the hospital encounter of 10/13/16  Gastrointestinal Panel by PCR , Stool     Status: None   Collection Time: 10/13/16 10:00 AM  Result Value Ref Range Status   Campylobacter species NOT DETECTED NOT DETECTED Final   Plesimonas shigelloides NOT DETECTED NOT DETECTED Final   Salmonella species NOT DETECTED NOT DETECTED Final   Yersinia enterocolitica NOT DETECTED NOT DETECTED Final   Vibrio species NOT DETECTED NOT DETECTED Final   Vibrio cholerae NOT DETECTED NOT DETECTED Final   Enteroaggregative E coli (EAEC) NOT DETECTED NOT DETECTED Final   Enteropathogenic E coli (EPEC) NOT DETECTED NOT DETECTED Final   Enterotoxigenic E coli (ETEC) NOT DETECTED NOT DETECTED Final   Shiga like toxin producing E coli (STEC) NOT DETECTED NOT DETECTED Final   Shigella/Enteroinvasive E coli (EIEC) NOT DETECTED NOT DETECTED Final   Cryptosporidium NOT DETECTED NOT DETECTED Final   Cyclospora cayetanensis NOT DETECTED NOT DETECTED Final   Entamoeba histolytica NOT DETECTED NOT DETECTED Final   Giardia lamblia NOT DETECTED NOT DETECTED Final   Adenovirus F40/41 NOT DETECTED NOT  DETECTED Final   Astrovirus NOT DETECTED NOT DETECTED Final   Norovirus GI/GII NOT DETECTED NOT DETECTED Final   Rotavirus A NOT DETECTED NOT DETECTED Final   Sapovirus (I, II, IV, and V) NOT DETECTED NOT DETECTED Final    Coagulation Studies: No results for input(s): LABPROT, INR in the last 72 hours.  Urinalysis: No results for input(s): COLORURINE, LABSPEC, PHURINE, GLUCOSEU, HGBUR, BILIRUBINUR, KETONESUR, PROTEINUR, UROBILINOGEN, NITRITE, LEUKOCYTESUR in the last 72 hours.  Invalid input(s): APPERANCEUR    Imaging: No results  found.   Medications:   . sodium chloride 50 mL/hr at 11/15/16 1210   . amLODipine  10 mg Oral Daily  . aspirin EC  81 mg Oral Daily  . carvedilol  25 mg Oral BID WC  . cilostazol  50 mg Oral BID  . FLUoxetine  40 mg Oral QHS  . heparin  5,000 Units Subcutaneous Q8H  . insulin aspart  0-5 Units Subcutaneous QHS  . insulin aspart  0-9 Units Subcutaneous TID WC  . insulin aspart protamine- aspart  30 Units Subcutaneous BID WC  . isosorbide-hydrALAZINE  1 tablet Oral TID  . pantoprazole  40 mg Oral Daily  . prasugrel  10 mg Oral Daily  . rosuvastatin  40 mg Oral q1800  . sodium chloride flush  3 mL Intravenous Q12H   acetaminophen **OR** acetaminophen, albuterol, bisacodyl, HYDROcodone-acetaminophen, menthol-cetylpyridinium, nitroGLYCERIN, ondansetron **OR** ondansetron (ZOFRAN) IV, polyethylene glycol  Assessment/ Plan:  56 y.o. male with past medical history of congestive heart failure with normal ejection fraction of 60%, chronic kidney disease stage IV baseline creatinine 2.7, EGFR 24, GERD, hypertension, peripheral vascular disease, diabetes mellitus type 2  1. Acute renal failure/chronic kidney disease stage IV baseline creatinine 2.7 with EGFR 24/proteinuria/diabetes mellitus type 2. It is been sometime since the patient has had renal follow-up. It appears he saw [facility] nephrology in July 2017 but has not had any further follow-up.  - Renal function worse today, Cr up to 4.75.  Case discussed with hospitalist.  We have decided to start pt on 0.9 NS to see if this will improve his Cr.  Check renal US tomorrow as well.   2. Anemia of chronic kidney disease. Hgb 9.6 at last check, no urgent indication for procrit.   3. Hypertension. Continue amlodipine and coreg, lisinopril was stopped.    LOS: 0 Sylwia Cuervo 3/18/20188:45 PM

## 2016-11-15 NOTE — Progress Notes (Signed)
CBG 184 per [place] NT. Glucometer not synching yet.

## 2016-11-15 NOTE — Progress Notes (Signed)
[place] [facility] [place] NAME: Scott [name]    MR#:  [id number]  DATE OF BIRTH:  1961-04-02  SUBJECTIVE:  CHIEF COMPLAINT:   Chief Complaint  Patient presents with  . Chest Pain  . Shortness of Breath   Shortness of breath resolved. Has some dry cough. No orthopnea or lower extremity edema.  REVIEW OF SYSTEMS:    Review of Systems  Constitutional: Negative for chills and fever.  HENT: Negative for sore throat.   Eyes: Negative for blurred vision, double vision and pain.  Respiratory: Negative for cough, hemoptysis, shortness of breath and wheezing.   Cardiovascular: Negative for chest pain, palpitations, orthopnea and leg swelling.  Gastrointestinal: Negative for abdominal pain, constipation, diarrhea, heartburn, nausea and vomiting.  Genitourinary: Negative for dysuria and hematuria.  Musculoskeletal: Negative for back pain and joint pain.  Skin: Negative for rash.  Neurological: Negative for sensory change, speech change, focal weakness and headaches.  Endo/Heme/Allergies: Does not bruise/bleed easily.  Psychiatric/Behavioral: Negative for depression. The patient is not nervous/anxious.     DRUG ALLERGIES:  No Known Allergies  VITALS:  Blood pressure (!) 114/51, pulse 68, temperature 98 F (36.7 C), temperature source Oral, resp. rate 12, height 5\' 9"  (1.753 m), weight 108.5 kg (239 lb 1.6 oz), SpO2 97 %.  PHYSICAL EXAMINATION:   Physical Exam  GENERAL:  56 y.o.-year-old patient lying in the bed with no acute distress.  EYES: Pupils equal, round, reactive to light and accommodation. No scleral icterus. Extraocular muscles intact.  HEENT: Head atraumatic, normocephalic. Oropharynx and nasopharynx clear.  NECK:  Supple, no jugular venous distention. No thyroid enlargement, no tenderness.  LUNGS: Normal breath sounds bilaterally, no wheezing, rales, rhonchi. No use of accessory muscles of respiration.  CARDIOVASCULAR: S1, S2  normal. No murmurs, rubs, or gallops.  ABDOMEN: Soft, nontender, nondistended. Bowel sounds present. No organomegaly or mass.  EXTREMITIES: No cyanosis, clubbing or edema b/l.    NEUROLOGIC: Cranial nerves II through XII are intact. No focal Motor or sensory deficits b/l.   PSYCHIATRIC: The patient is alert and oriented x 3.  SKIN: No obvious rash, lesion, or ulcer.   LABORATORY PANEL:   CBC  Recent Labs Lab 11/14/16 0538  WBC 7.1  HGB 9.6*  HCT 27.9*  PLT 251   ------------------------------------------------------------------------------------------------------------------ Chemistries   Recent Labs Lab 11/15/16 0622  NA 139  K 4.0  CL 108  CO2 23  GLUCOSE 191*  BUN 40*  CREATININE 4.75*  CALCIUM 7.4*   ------------------------------------------------------------------------------------------------------------------  Cardiac Enzymes  Recent Labs Lab 11/13/16 0804  TROPONINI 0.03*   ------------------------------------------------------------------------------------------------------------------  RADIOLOGY:  No results found.   ASSESSMENT AND PLAN:   * Acute on chronic diastolic chf - IV Lasix, Beta blockers. Stopped Lasix due to worsening creatinine. - Input and Output - Counseled to limit fluids and Salt - Monitor Bun/Cr and Potassium - Echo repeated and ejection fraction 60%. No significant valvular abnormalities. -Cardiology follow up after discharge  * AKI over CKD3 Initially thought to be cardiorenal syndrome. Now creatinine is worsening with diuresis.  Further worsening of creatinine today in spite of holding Lasix and lisinopril. Monitor input and output.  Start normal saline at 50 ML per hour for 1 day. Discussed with Dr. Holley Raring. Discussed with patient and his partner at bedside regarding need for dialysis if there is continued worsening.  * Insulin dependent diabetes mellitus. Continue patient's home  insulin with reduced dose. Sliding  scale insulin.  * Accelerated Hypertension -  improved Continue home meds  * DVT prophylaxis Heparin  All the records are reviewed and case discussed with Care Management/Social Workerr. Management plans discussed with the patient, family and they are in agreement.  CODE STATUS: FULL CODE  DVT Prophylaxis: SCDs  TOTAL TIME TAKING CARE OF THIS PATIENT: 35 minutes.   POSSIBLE D/C IN 1-2 DAYS, DEPENDING ON CLINICAL CONDITION.  Hillary Bow R M.D on 11/15/2016 at 12:38 PM  Between 7am to 6pm - Pager - [phone]  After 6pm go to [email or web address] - password [email or web address] [facility]  Office  [phone]  CC: Primary care physician; Marden Noble, MD  Note: This dictation was prepared with Dragon dictation along with smaller phrase technology. Any transcriptional errors that result from this process are unintentional.

## 2016-11-16 ENCOUNTER — Inpatient Hospital Stay: Payer: Medicare Other

## 2016-11-16 LAB — BASIC METABOLIC PANEL WITH GFR
Anion gap: 8 (ref 5–15)
BUN: 42 mg/dL — ABNORMAL HIGH (ref 6–20)
CO2: 22 mmol/L (ref 22–32)
Calcium: 7.5 mg/dL — ABNORMAL LOW (ref 8.9–10.3)
Chloride: 110 mmol/L (ref 101–111)
Creatinine, Ser: 5.03 mg/dL — ABNORMAL HIGH (ref 0.61–1.24)
GFR calc Af Amer: 13 mL/min — ABNORMAL LOW
GFR calc non Af Amer: 11 mL/min — ABNORMAL LOW
Glucose, Bld: 123 mg/dL — ABNORMAL HIGH (ref 65–99)
Potassium: 4 mmol/L (ref 3.5–5.1)
Sodium: 140 mmol/L (ref 135–145)

## 2016-11-16 LAB — GLUCOSE, CAPILLARY
GLUCOSE-CAPILLARY: 143 mg/dL — AB (ref 65–99)
GLUCOSE-CAPILLARY: 123 mg/dL — AB (ref 65–99)
Glucose-Capillary: 119 mg/dL — ABNORMAL HIGH (ref 65–99)
Glucose-Capillary: 184 mg/dL — ABNORMAL HIGH (ref 65–99)
Glucose-Capillary: 132 mg/dL — ABNORMAL HIGH (ref 65–99)

## 2016-11-16 MED ORDER — ATORVASTATIN CALCIUM 20 MG PO TABS
80.0000 mg | ORAL_TABLET | Freq: Every day | ORAL | Status: DC
  Administered 2016-11-16 – 2016-11-19 (×4): 80 mg via ORAL
  Filled 2016-11-16 (×4): qty 4

## 2016-11-16 NOTE — Progress Notes (Signed)
Worsening renal function,nephrology follow up in progress,dialysis option discussed with patient today if no improvement

## 2016-11-16 NOTE — Progress Notes (Signed)
[facility]  ROUNDING NOTE   Subjective:  Laying in bed. No complaints. NS at 13m/hr for 24 hours.  Creatinine 5.03 (4.75)  Objective:  Vital signs in last 24 hours:  Temp:  [97.7 F (36.5 C)-97.9 F (36.6 C)] 97.7 F (36.5 C) (03/19 1135) Pulse Rate:  [68-76] 69 (03/19 1135) Resp:  [16-20] 20 (03/19 1135) BP: (101-163)/(56-86) 101/58 (03/19 1135) SpO2:  [96 %-98 %] 97 % (03/19 1135) Weight:  [109.2 kg (240 lb 11.2 oz)] 109.2 kg (240 lb 11.2 oz) (03/19 0519)  Weight change: 0.726 kg (1 lb 9.6 oz) Filed Weights   11/14/16 0543 11/15/16 0521 11/16/16 0519  Weight: 107.4 kg (236 lb 12.8 oz) 108.5 kg (239 lb 1.6 oz) 109.2 kg (240 lb 11.2 oz)    Intake/Output: I/O last 3 completed shifts: In: 797.5 [P.O.:480; I.V.:317.5] Out: 2275 [Urine:2275]   Intake/Output this shift:  Total I/O In: 240 [P.O.:240] Out: 500 [Urine:500]  Physical Exam: General: No acute distress  Head: Normocephalic, atraumatic. Moist oral mucosal membranes  Eyes: Anicteric  Neck: Supple, trachea midline  Lungs:  Clear to auscultation, normal effort  Heart: S1S2 no rubs  Abdomen:  Soft, nontender, bowel sounds present   Extremities: No peripheral edema.  Neurologic: Nonfocal, moving all four extremities  Skin: No lesions       Basic Metabolic Panel:  Recent Labs Lab 11/13/16 0804 11/14/16 0538 11/15/16 0622 11/16/16 0533  NA 141 139 139 140  K 4.3 4.0 4.0 4.0  CL 110 109 108 110  CO2 _0 GLUCOSE 161* 119* 191* 123*  BUN 27* 32* 40* 42*  CREATININE 3.71* 4.18* 4.75* 5.03*  CALCIUM 7.8* 7.6* 7.4* 7.5*    Liver Function Tests: No results for input(s): AST, ALT, ALKPHOS, BILITOT, PROT, ALBUMIN in the last 168 hours. No results for input(s): LIPASE, AMYLASE in the last 168 hours. No results for input(s): AMMONIA in the last 168 hours.  CBC:  Recent Labs Lab 11/13/16 0804 11/14/16 0538  WBC 11.0* 7.1  NEUTROABS 8.3*  --   HGB 11.3* 9.6*  HCT 33.0* 27.9*   MCV 90.1 89.4  PLT 284 251    Cardiac Enzymes:  Recent Labs Lab 11/13/16 0804  TROPONINI 0.03*    BNP: Invalid input(s): POCBNP  CBG:  Recent Labs Lab 11/15/16 1136 11/15/16 1645 11/15/16 2047 11/16/16 0745 11/16/16 1148  GLUCAP 184* 155* 146* 119* 123*    Microbiology: Results for orders placed or performed during the hospital encounter of 10/13/16  Gastrointestinal Panel by PCR , Stool     Status: None   Collection Time: 10/13/16 10:00 AM  Result Value Ref Range Status   Campylobacter species NOT DETECTED NOT DETECTED Final   Plesimonas shigelloides NOT DETECTED NOT DETECTED Final   Salmonella species NOT DETECTED NOT DETECTED Final   Yersinia enterocolitica NOT DETECTED NOT DETECTED Final   Vibrio species NOT DETECTED NOT DETECTED Final   Vibrio cholerae NOT DETECTED NOT DETECTED Final   Enteroaggregative E coli (EAEC) NOT DETECTED NOT DETECTED Final   Enteropathogenic E coli (EPEC) NOT DETECTED NOT DETECTED Final   Enterotoxigenic E coli (ETEC) NOT DETECTED NOT DETECTED Final   Shiga like toxin producing E coli (STEC) NOT DETECTED NOT DETECTED Final   Shigella/Enteroinvasive E coli (EIEC) NOT DETECTED NOT DETECTED Final   Cryptosporidium NOT DETECTED NOT DETECTED Final   Cyclospora cayetanensis NOT DETECTED NOT DETECTED Final   Entamoeba histolytica NOT DETECTED NOT DETECTED Final   Giardia lamblia NOT  DETECTED NOT DETECTED Final   Adenovirus F40/41 NOT DETECTED NOT DETECTED Final   Astrovirus NOT DETECTED NOT DETECTED Final   Norovirus GI/GII NOT DETECTED NOT DETECTED Final   Rotavirus A NOT DETECTED NOT DETECTED Final   Sapovirus (I, II, IV, and V) NOT DETECTED NOT DETECTED Final    Coagulation Studies: No results for input(s): LABPROT, INR in the last 72 hours.  Urinalysis: No results for input(s): COLORURINE, LABSPEC, PHURINE, GLUCOSEU, HGBUR, BILIRUBINUR, KETONESUR, PROTEINUR, UROBILINOGEN, NITRITE, LEUKOCYTESUR in the last 72 hours.  Invalid  input(s): APPERANCEUR    Imaging: US Renal  Result Date: 11/16/2016 CLINICAL DATA:  Acute renal failure EXAM: RENAL / URINARY TRACT ULTRASOUND COMPLETE COMPARISON:  12/03/2014 FINDINGS: Right Kidney: Length: 10.6 cm. Echogenicity within normal limits. No mass or hydronephrosis visualized. Left Kidney: Length: 10.7 cm. Echogenicity within normal limits. No mass or hydronephrosis visualized. No renal calculi are identified bilaterally. Bladder: Appears normal for degree of bladder distention. Bilateral ureteral jets are visualized. IMPRESSION: 1. No hydronephrosis or renal calculi. 2. Unremarkable urinary bladder. Bilateral ureteral jets are visualized. Electronically Signed   By: Lahoma Crocker M.D.   On: 11/16/2016 09:34     Medications:    . amLODipine  10 mg Oral Daily  . aspirin EC  81 mg Oral Daily  . carvedilol  25 mg Oral BID WC  . cilostazol  50 mg Oral BID  . FLUoxetine  40 mg Oral QHS  . heparin  5,000 Units Subcutaneous Q8H  . insulin aspart  0-5 Units Subcutaneous QHS  . insulin aspart  0-9 Units Subcutaneous TID WC  . insulin aspart protamine- aspart  30 Units Subcutaneous BID WC  . isosorbide-hydrALAZINE  1 tablet Oral TID  . pantoprazole  40 mg Oral Daily  . prasugrel  10 mg Oral Daily  . rosuvastatin  40 mg Oral q1800  . sodium chloride flush  3 mL Intravenous Q12H   acetaminophen **OR** acetaminophen, albuterol, alum & mag hydroxide-simeth, bisacodyl, HYDROcodone-acetaminophen, menthol-cetylpyridinium, nitroGLYCERIN, ondansetron **OR** ondansetron (ZOFRAN) IV, polyethylene glycol  Assessment/ Plan:  56 y.o. male with past medical history of congestive heart failure with normal ejection fraction of 60%, chronic kidney disease stage IV baseline creatinine 2.7, EGFR 24, GERD, hypertension, peripheral vascular disease, diabetes mellitus type 2  1. Acute renal failure on chronic kidney disease stage IV with proteinuria: baseline creatinine 2.7 with EGFR 24 on 03/11/2016.   Chronic kidney disease secondary to diabetes and hypertension. Previously followed with [facility].  Acute renal failure secondary to acute cardiorenal syndrome and overdiuresis.  - Hold IV fluids.  - Continue to monitor renal function. No acute indication for dialysis.   2. Anemia of chronic kidney disease. Hgb 9.6 - No indication for ESA therapy  3. Hypertension. Blood pressure at goal.  - holding diuretics. Holding lisinopril - amlodipine, carvedilol, BiDil   LOS: 1 Scott [name] 3/19/20183:00 PM

## 2016-11-16 NOTE — Progress Notes (Signed)
[place] at [place] NAME: Scott [name]    MR#:  [id number]  DATE OF BIRTH:  06-03-1961  SUBJECTIVE:  CHIEF COMPLAINT:   Chief Complaint  Patient presents with  . Chest Pain  . Shortness of Breath  wanting to go home but realizes it's not a good idea with creat going up. REVIEW OF SYSTEMS:    Review of Systems  Constitutional: Negative for chills and fever.  HENT: Negative for sore throat.   Eyes: Negative for blurred vision, double vision and pain.  Respiratory: Negative for cough, hemoptysis, shortness of breath and wheezing.   Cardiovascular: Negative for chest pain, palpitations, orthopnea and leg swelling.  Gastrointestinal: Negative for abdominal pain, constipation, diarrhea, heartburn, nausea and vomiting.  Genitourinary: Negative for dysuria and hematuria.  Musculoskeletal: Negative for back pain and joint pain.  Skin: Negative for rash.  Neurological: Negative for sensory change, speech change, focal weakness and headaches.  Endo/Heme/Allergies: Does not bruise/bleed easily.  Psychiatric/Behavioral: Negative for depression. The patient is not nervous/anxious.    DRUG ALLERGIES:  No Known Allergies  VITALS:  Blood pressure (!) 101/58, pulse 69, temperature 97.7 F (36.5 C), temperature source Oral, resp. rate 20, height 5\' 9"  (1.753 m), weight 109.2 kg (240 lb 11.2 oz), SpO2 97 %.  PHYSICAL EXAMINATION:   Physical Exam  GENERAL:  56 y.o.-year-old patient lying in the bed with no acute distress.  EYES: Pupils equal, round, reactive to light and accommodation. No scleral icterus. Extraocular muscles intact.  HEENT: Head atraumatic, normocephalic. Oropharynx and nasopharynx clear.  NECK:  Supple, no jugular venous distention. No thyroid enlargement, no tenderness.  LUNGS: Normal breath sounds bilaterally, no wheezing, rales, rhonchi. No use of accessory muscles of respiration.  CARDIOVASCULAR: S1, S2 normal. No murmurs,  rubs, or gallops.  ABDOMEN: Soft, nontender, nondistended. Bowel sounds present. No organomegaly or mass.  EXTREMITIES: No cyanosis, clubbing or edema b/l.    NEUROLOGIC: Cranial nerves II through XII are intact. No focal Motor or sensory deficits b/l.   PSYCHIATRIC: The patient is alert and oriented x 3.  SKIN: No obvious rash, lesion, or ulcer.   LABORATORY PANEL:   CBC  Recent Labs Lab 11/14/16 0538  WBC 7.1  HGB 9.6*  HCT 27.9*  PLT 251   ------------------------------------------------------------------------------------------------------------------ Chemistries   Recent Labs Lab 11/16/16 0533  NA 140  K 4.0  CL 110  CO2 22  GLUCOSE 123*  BUN 42*  CREATININE 5.03*  CALCIUM 7.5*   ------------------------------------------------------------------------------------------------------------------  Cardiac Enzymes  Recent Labs Lab 11/13/16 0804  TROPONINI 0.03*   ------------------------------------------------------------------------------------------------------------------  RADIOLOGY:  US Renal  Result Date: 11/16/2016 CLINICAL DATA:  Acute renal failure EXAM: RENAL / URINARY TRACT ULTRASOUND COMPLETE COMPARISON:  12/03/2014 FINDINGS: Right Kidney: Length: 10.6 cm. Echogenicity within normal limits. No mass or hydronephrosis visualized. Left Kidney: Length: 10.7 cm. Echogenicity within normal limits. No mass or hydronephrosis visualized. No renal calculi are identified bilaterally. Bladder: Appears normal for degree of bladder distention. Bilateral ureteral jets are visualized. IMPRESSION: 1. No hydronephrosis or renal calculi. 2. Unremarkable urinary bladder. Bilateral ureteral jets are visualized. Electronically Signed   By: Lahoma Crocker M.D.   On: 11/16/2016 09:34     ASSESSMENT AND PLAN:   * Acute on chronic diastolic chf - IV Lasix, Beta blockers. Stopped Lasix due to worsening creatinine. - Input and Output - Counseled to limit fluids and Salt -  Monitor Bun/Cr and Potassium - Echo repeated and ejection fraction 60%.  No significant valvular abnormalities. -Cardiology follow up after discharge  * AKI over CKD3 Initially thought to be cardiorenal syndrome. Now creatinine is worsening with diuresis.  Further worsening of creatinine today in spite of holding Lasix and lisinopril and fluids. Monitor input and output.  - Creatinine 5.03 (4.75) Discussed with patient and his partner at bedside regarding need for dialysis if there is continued worsening. Not quite need for HD per nephro  * Insulin dependent diabetes mellitus. Continue patient's home  insulin with reduced dose. Sliding scale insulin.  * Accelerated Hypertension - improved Continue home meds  * DVT prophylaxis Heparin  All the records are reviewed and case discussed with Care Management/Social Workerr. Management plans discussed with the patient, family and they are in agreement.  CODE STATUS: FULL CODE  DVT Prophylaxis: SCDs  TOTAL TIME TAKING CARE OF THIS PATIENT: 35 minutes.   POSSIBLE D/C IN 1-2 DAYS, DEPENDING ON CLINICAL CONDITION. And renal function  Max Sane M.D on 11/16/2016 at 5:37 PM  Between 7am to 6pm - Pager - [phone]  After 6pm go to [email or web address] - password [email or web address] [facility]  Office  [phone]  CC: Primary care physician; Marden Noble, MD  Note: This dictation was prepared with Dragon dictation along with smaller phrase technology. Any transcriptional errors that result from this process are unintentional.

## 2016-11-16 NOTE — Care Management (Signed)
Admitted with congestive heart failure.  Is not requiring supplemental oxygen. Current with pcp.  has access to scales.  Provided with HF education material and made referral to HD clinic.  No issues with transportation

## 2016-11-17 LAB — CBC
HEMATOCRIT: 26.3 % — AB (ref 40.0–52.0)
Hemoglobin: 9.1 g/dL — ABNORMAL LOW (ref 13.0–18.0)
MCH: 31.4 pg (ref 26.0–34.0)
MCHC: 34.8 g/dL (ref 32.0–36.0)
MCV: 90.3 fL (ref 80.0–100.0)
Platelets: 271 10*3/uL (ref 150–440)
RBC: 2.91 MIL/uL — AB (ref 4.40–5.90)
RDW: 13.8 % (ref 11.5–14.5)
WBC: 7.5 10*3/uL (ref 3.8–10.6)

## 2016-11-17 LAB — GLUCOSE, CAPILLARY
GLUCOSE-CAPILLARY: 129 mg/dL — AB (ref 65–99)
Glucose-Capillary: 109 mg/dL — ABNORMAL HIGH (ref 65–99)
Glucose-Capillary: 199 mg/dL — ABNORMAL HIGH (ref 65–99)
Glucose-Capillary: 118 mg/dL — ABNORMAL HIGH (ref 65–99)

## 2016-11-17 LAB — BASIC METABOLIC PANEL
Anion gap: 6 (ref 5–15)
BUN: 43 mg/dL — AB (ref 6–20)
CO2: 22 mmol/L (ref 22–32)
CREATININE: 5.27 mg/dL — AB (ref 0.61–1.24)
Calcium: 7.6 mg/dL — ABNORMAL LOW (ref 8.9–10.3)
Chloride: 110 mmol/L (ref 101–111)
GFR calc Af Amer: 13 mL/min — ABNORMAL LOW (ref >60)
GFR, EST NON AFRICAN AMERICAN: 11 mL/min — AB (ref >60)
Glucose, Bld: 126 mg/dL — ABNORMAL HIGH (ref 65–99)
Potassium: 4.4 mmol/L (ref 3.5–5.1)
Sodium: 138 mmol/L (ref 135–145)

## 2016-11-17 NOTE — Progress Notes (Signed)
[facility]  ROUNDING NOTE   Subjective:  Laying in bed. No complaints. Wants to go home.  Creatinine 5.27 (5.03) (4.75)  Objective:  Vital signs in last 24 hours:  Temp:  [97.5 F (36.4 C)-97.8 F (36.6 C)] 97.8 F (36.6 C) (03/20 0738) Pulse Rate:  [69-74] 74 (03/20 0738) Resp:  [16-18] 16 (03/20 0738) BP: (125-165)/(65-77) 155/76 (03/20 0738) SpO2:  [95 %-97 %] 95 % (03/20 0738) Weight:  [109.1 kg (240 lb 8 oz)] 109.1 kg (240 lb 8 oz) (03/20 0452)  Weight change: -0.091 kg (-3.2 oz) Filed Weights   11/15/16 0521 11/16/16 0519 11/17/16 0452  Weight: 108.5 kg (239 lb 1.6 oz) 109.2 kg (240 lb 11.2 oz) 109.1 kg (240 lb 8 oz)    Intake/Output: I/O last 3 completed shifts: In: 240 [P.O.:240] Out: 1500 [Urine:1500]   Intake/Output this shift:  Total I/O In: 360 [P.O.:360] Out: -   Physical Exam: General: No acute distress  Head: Normocephalic, atraumatic. Moist oral mucosal membranes  Eyes: Anicteric  Neck: Supple, trachea midline  Lungs:  Clear to auscultation, normal effort  Heart: S1S2 no rubs  Abdomen:  Soft, nontender, bowel sounds present   Extremities: No peripheral edema.  Neurologic: Nonfocal, moving all four extremities  Skin: No lesions       Basic Metabolic Panel:  Recent Labs Lab 11/13/16 0804 11/14/16 0538 11/15/16 0622 11/16/16 0533 11/17/16 0546  NA 141 139 139 140 138  K 4.3 4.0 4.0 4.0 4.4  CL 110 109 108 110 110  CO2 24 24 23 22 22  GLUCOSE 161* 119* 191* 123* 126*  BUN 27* 32* 40* 42* 43*  CREATININE 3.71* 4.18* 4.75* 5.03* 5.27*  CALCIUM 7.8* 7.6* 7.4* 7.5* 7.6*    Liver Function Tests: No results for input(s): AST, ALT, ALKPHOS, BILITOT, PROT, ALBUMIN in the last 168 hours. No results for input(s): LIPASE, AMYLASE in the last 168 hours. No results for input(s): AMMONIA in the last 168 hours.  CBC:  Recent Labs Lab 11/13/16 0804 11/14/16 0538 11/17/16 0546  WBC 11.0* 7.1 7.5  NEUTROABS 8.3*  --   --   HGB  11.3* 9.6* 9.1*  HCT 33.0* 27.9* 26.3*  MCV 90.1 89.4 90.3  PLT 284 251 271    Cardiac Enzymes:  Recent Labs Lab 11/13/16 0804  TROPONINI 0.03*    BNP: Invalid input(s): POCBNP  CBG:  Recent Labs Lab 11/16/16 0745 11/16/16 1148 11/16/16 1714 11/16/16 2102 11/17/16 0747  GLUCAP 119* 123* 184* 132* 129*    Microbiology: Results for orders placed or performed during the hospital encounter of 10/13/16  Gastrointestinal Panel by PCR , Stool     Status: None   Collection Time: 10/13/16 10:00 AM  Result Value Ref Range Status   Campylobacter species NOT DETECTED NOT DETECTED Final   Plesimonas shigelloides NOT DETECTED NOT DETECTED Final   Salmonella species NOT DETECTED NOT DETECTED Final   Yersinia enterocolitica NOT DETECTED NOT DETECTED Final   Vibrio species NOT DETECTED NOT DETECTED Final   Vibrio cholerae NOT DETECTED NOT DETECTED Final   Enteroaggregative E coli (EAEC) NOT DETECTED NOT DETECTED Final   Enteropathogenic E coli (EPEC) NOT DETECTED NOT DETECTED Final   Enterotoxigenic E coli (ETEC) NOT DETECTED NOT DETECTED Final   Shiga like toxin producing E coli (STEC) NOT DETECTED NOT DETECTED Final   Shigella/Enteroinvasive E coli (EIEC) NOT DETECTED NOT DETECTED Final   Cryptosporidium NOT DETECTED NOT DETECTED Final   Cyclospora cayetanensis NOT DETECTED NOT   DETECTED Final   Entamoeba histolytica NOT DETECTED NOT DETECTED Final   Giardia lamblia NOT DETECTED NOT DETECTED Final   Adenovirus F40/41 NOT DETECTED NOT DETECTED Final   Astrovirus NOT DETECTED NOT DETECTED Final   Norovirus GI/GII NOT DETECTED NOT DETECTED Final   Rotavirus A NOT DETECTED NOT DETECTED Final   Sapovirus (I, II, IV, and V) NOT DETECTED NOT DETECTED Final    Coagulation Studies: No results for input(s): LABPROT, INR in the last 72 hours.  Urinalysis: No results for input(s): COLORURINE, LABSPEC, PHURINE, GLUCOSEU, HGBUR, BILIRUBINUR, KETONESUR, PROTEINUR, UROBILINOGEN, NITRITE,  LEUKOCYTESUR in the last 72 hours.  Invalid input(s): APPERANCEUR    Imaging: US Renal  Result Date: 11/16/2016 CLINICAL DATA:  Acute renal failure EXAM: RENAL / URINARY TRACT ULTRASOUND COMPLETE COMPARISON:  12/03/2014 FINDINGS: Right Kidney: Length: 10.6 cm. Echogenicity within normal limits. No mass or hydronephrosis visualized. Left Kidney: Length: 10.7 cm. Echogenicity within normal limits. No mass or hydronephrosis visualized. No renal calculi are identified bilaterally. Bladder: Appears normal for degree of bladder distention. Bilateral ureteral jets are visualized. IMPRESSION: 1. No hydronephrosis or renal calculi. 2. Unremarkable urinary bladder. Bilateral ureteral jets are visualized. Electronically Signed   By: Lahoma Crocker M.D.   On: 11/16/2016 09:34     Medications:    . amLODipine  10 mg Oral Daily  . aspirin EC  81 mg Oral Daily  . atorvastatin  80 mg Oral q1800  . carvedilol  25 mg Oral BID WC  . cilostazol  50 mg Oral BID  . FLUoxetine  40 mg Oral QHS  . heparin  5,000 Units Subcutaneous Q8H  . insulin aspart  0-5 Units Subcutaneous QHS  . insulin aspart  0-9 Units Subcutaneous TID WC  . insulin aspart protamine- aspart  30 Units Subcutaneous BID WC  . isosorbide-hydrALAZINE  1 tablet Oral TID  . pantoprazole  40 mg Oral Daily  . prasugrel  10 mg Oral Daily  . sodium chloride flush  3 mL Intravenous Q12H   acetaminophen **OR** acetaminophen, albuterol, alum & mag hydroxide-simeth, bisacodyl, HYDROcodone-acetaminophen, menthol-cetylpyridinium, nitroGLYCERIN, ondansetron **OR** ondansetron (ZOFRAN) IV, polyethylene glycol  Assessment/ Plan:  56 y.o. male with past medical history of congestive heart failure with normal ejection fraction of 60%, chronic kidney disease stage IV baseline creatinine 2.7, EGFR 24, GERD, hypertension, peripheral vascular disease, diabetes mellitus type 2  1. Acute renal failure on chronic kidney disease stage IV with proteinuria: baseline  creatinine 2.7 with EGFR 24 on 03/11/2016.  Chronic kidney disease secondary to diabetes and hypertension. Previously followed with [facility].  Acute renal failure secondary to acute cardiorenal syndrome and overdiuresis.  - Hold IV fluids.  - Continue to monitor renal function and urine output - nonoliguric. No acute indication for dialysis.   2. Anemia of chronic kidney disease. Hgb 9.1 - No indication for ESA therapy  3. Hypertension. Blood pressure elevated  - holding diuretics. Holding lisinopril - amlodipine, carvedilol, BiDil   LOS: 2 Arwa Yero 3/20/201811:51 AM

## 2016-11-17 NOTE — Care Management (Signed)
Patient is followed at [facility].  He may benefit from home health nurse follow up for heart failure.  Patient is on a fluid restriction.  Nephrology consulting Has access to scales but does not weigh daily. Agreeable to r eferral to heart failure clinic.  Denies issues with transporation

## 2016-11-17 NOTE — Progress Notes (Signed)
[place] [facility] [place] NAME: Navin Dogan    MR#:  [id number]  DATE OF BIRTH:  December 10, 1960  SUBJECTIVE:  CHIEF COMPLAINT:   Chief Complaint  Patient presents with  . Chest Pain  . Shortness of Breath  creat continues to go up, no symptoms REVIEW OF SYSTEMS:    Review of Systems  Constitutional: Negative for chills and fever.  HENT: Negative for sore throat.   Eyes: Negative for blurred vision, double vision and pain.  Respiratory: Negative for cough, hemoptysis, shortness of breath and wheezing.   Cardiovascular: Negative for chest pain, palpitations, orthopnea and leg swelling.  Gastrointestinal: Negative for abdominal pain, constipation, diarrhea, heartburn, nausea and vomiting.  Genitourinary: Negative for dysuria and hematuria.  Musculoskeletal: Negative for back pain and joint pain.  Skin: Negative for rash.  Neurological: Negative for sensory change, speech change, focal weakness and headaches.  Endo/Heme/Allergies: Does not bruise/bleed easily.  Psychiatric/Behavioral: Negative for depression. The patient is not nervous/anxious.    DRUG ALLERGIES:  No Known Allergies  VITALS:  Blood pressure 118/68, pulse 66, temperature 97.3 F (36.3 C), temperature source Oral, resp. rate 18, height 5\' 9"  (1.753 m), weight 109.1 kg (240 lb 8 oz), SpO2 96 %.  PHYSICAL EXAMINATION:   Physical Exam  GENERAL:  56 y.o.-year-old patient lying in the bed with no acute distress.  EYES: Pupils equal, round, reactive to light and accommodation. No scleral icterus. Extraocular muscles intact.  HEENT: Head atraumatic, normocephalic. Oropharynx and nasopharynx clear.  NECK:  Supple, no jugular venous distention. No thyroid enlargement, no tenderness.  LUNGS: Normal breath sounds bilaterally, no wheezing, rales, rhonchi. No use of accessory muscles of respiration.  CARDIOVASCULAR: S1, S2 normal. No murmurs, rubs, or gallops.  ABDOMEN: Soft, nontender,  nondistended. Bowel sounds present. No organomegaly or mass.  EXTREMITIES: No cyanosis, clubbing or edema b/l.    NEUROLOGIC: Cranial nerves II through XII are intact. No focal Motor or sensory deficits b/l.   PSYCHIATRIC: The patient is alert and oriented x 3.  SKIN: No obvious rash, lesion, or ulcer.   LABORATORY PANEL:   CBC  Recent Labs Lab 11/17/16 0546  WBC 7.5  HGB 9.1*  HCT 26.3*  PLT 271   ------------------------------------------------------------------------------------------------------------------ Chemistries   Recent Labs Lab 11/17/16 0546  NA 138  K 4.4  CL 110  CO2 22  GLUCOSE 126*  BUN 43*  CREATININE 5.27*  CALCIUM 7.6*   ------------------------------------------------------------------------------------------------------------------  Cardiac Enzymes  Recent Labs Lab 11/13/16 0804  TROPONINI 0.03*   ------------------------------------------------------------------------------------------------------------------  RADIOLOGY:  US Renal  Result Date: 11/16/2016 CLINICAL DATA:  Acute renal failure EXAM: RENAL / URINARY TRACT ULTRASOUND COMPLETE COMPARISON:  12/03/2014 FINDINGS: Right Kidney: Length: 10.6 cm. Echogenicity within normal limits. No mass or hydronephrosis visualized. Left Kidney: Length: 10.7 cm. Echogenicity within normal limits. No mass or hydronephrosis visualized. No renal calculi are identified bilaterally. Bladder: Appears normal for degree of bladder distention. Bilateral ureteral jets are visualized. IMPRESSION: 1. No hydronephrosis or renal calculi. 2. Unremarkable urinary bladder. Bilateral ureteral jets are visualized. Electronically Signed   By: Lahoma Crocker M.D.   On: 11/16/2016 09:34     ASSESSMENT AND PLAN:   * Acute on chronic diastolic chf - continue Beta blockers. Stopped Lasix due to worsening creatinine. - Input and Output - Counseled to limit fluids and Salt - Monitor Bun/Cr and Potassium - Echo repeated and  ejection fraction 60%. No significant valvular abnormalities. -Cardiology follow up after discharge  *  AKI over CKD3 Initially thought to be cardiorenal syndrome. Now creatinine is worsening with diuresis.  Further worsening of creatinine today in spite of holding Lasix and lisinopril and fluids. Monitor input and output.  - Creatinine 5.03 ->5.27 Discussed with patient and his partner at bedside regarding need for dialysis if there is continued worsening. Not quite need for HD per nephro  * Insulin dependent diabetes mellitus. Continue patient's home  insulin with reduced dose. Sliding scale insulin.  * Accelerated Hypertension - improved Continue home meds  * DVT prophylaxis Heparin  All the records are reviewed and case discussed with Care Management/Social Workerr. Management plans discussed with the patient, Dr Juleen China and they are in agreement.  CODE STATUS: FULL CODE  DVT Prophylaxis: SCDs  TOTAL TIME TAKING CARE OF THIS PATIENT: 35 minutes.   POSSIBLE D/C IN 1-2 DAYS, DEPENDING ON CLINICAL CONDITION. And renal function, need to wait for creat to plateau or improve some before D/C  Max Sane M.D on 11/17/2016 at 5:55 PM  Between 7am to 6pm - Pager - [phone]  After 6pm go to [email or web address] - password [email or web address] [facility]  Office  [phone]  CC: Primary care physician; [place]  Note: This dictation was prepared with Dragon dictation along with smaller phrase technology. Any transcriptional errors that result from this process are unintentional.

## 2016-11-18 LAB — GLUCOSE, CAPILLARY
GLUCOSE-CAPILLARY: 158 mg/dL — AB (ref 65–99)
GLUCOSE-CAPILLARY: 160 mg/dL — AB (ref 65–99)
GLUCOSE-CAPILLARY: 106 mg/dL — AB (ref 65–99)
Glucose-Capillary: 123 mg/dL — ABNORMAL HIGH (ref 65–99)

## 2016-11-18 LAB — CBC
HEMATOCRIT: 26.7 % — AB (ref 40.0–52.0)
Hemoglobin: 9.1 g/dL — ABNORMAL LOW (ref 13.0–18.0)
MCH: 30.8 pg (ref 26.0–34.0)
MCHC: 34 g/dL (ref 32.0–36.0)
MCV: 90.6 fL (ref 80.0–100.0)
PLATELETS: 276 10*3/uL (ref 150–440)
RBC: 2.95 MIL/uL — AB (ref 4.40–5.90)
RDW: 13.7 % (ref 11.5–14.5)
WBC: 8.3 10*3/uL (ref 3.8–10.6)

## 2016-11-18 LAB — BASIC METABOLIC PANEL
Anion gap: 4 — ABNORMAL LOW (ref 5–15)
BUN: 45 mg/dL — AB (ref 6–20)
CHLORIDE: 113 mmol/L — AB (ref 101–111)
CO2: 22 mmol/L (ref 22–32)
Calcium: 7.8 mg/dL — ABNORMAL LOW (ref 8.9–10.3)
Creatinine, Ser: 5.5 mg/dL — ABNORMAL HIGH (ref 0.61–1.24)
GFR calc Af Amer: 12 mL/min — ABNORMAL LOW (ref >60)
GFR, EST NON AFRICAN AMERICAN: 10 mL/min — AB (ref >60)
GLUCOSE: 127 mg/dL — AB (ref 65–99)
POTASSIUM: 4.6 mmol/L (ref 3.5–5.1)
Sodium: 139 mmol/L (ref 135–145)

## 2016-11-18 NOTE — Progress Notes (Signed)
[facility]  ROUNDING NOTE   Subjective:  .  Creatinine 5.5 (5.27) (5.03) (4.75)  Objective:  Vital signs in last 24 hours:  Temp:  [97.4 F (36.3 C)-97.6 F (36.4 C)] 97.4 F (36.3 C) (03/21 1332) Pulse Rate:  [67-72] 68 (03/21 1332) Resp:  [14-18] 14 (03/21 1332) BP: (117-143)/(67-87) 133/72 (03/21 1332) SpO2:  [96 %-98 %] 96 % (03/21 1332) Weight:  [109.7 kg (241 lb 14.4 oz)] 109.7 kg (241 lb 14.4 oz) (03/20 2047)  Weight change: 0.635 kg (1 lb 6.4 oz) Filed Weights   11/16/16 0519 11/17/16 0452 11/17/16 2047  Weight: 109.2 kg (240 lb 11.2 oz) 109.1 kg (240 lb 8 oz) 109.7 kg (241 lb 14.4 oz)    Intake/Output: I/O last 3 completed shifts: In: 1680 [P.O.:1680] Out: 500 [Urine:500]   Intake/Output this shift:  No intake/output data recorded.  Physical Exam: General: No acute distress  Head: Normocephalic, atraumatic. Moist oral mucosal membranes  Eyes: Anicteric  Neck: Supple, trachea midline  Lungs:  Clear to auscultation, normal effort  Heart: S1S2 no rubs  Abdomen:  Soft, nontender, bowel sounds present   Extremities: No peripheral edema.  Neurologic: Nonfocal, moving all four extremities  Skin: No lesions       Basic Metabolic Panel:  Recent Labs Lab 11/14/16 0538 11/15/16 0622 11/16/16 0533 11/17/16 0546 11/18/16 0453  NA 139 139 140 138 139  K 4.0 4.0 4.0 4.4 4.6  CL 109 108 110 110 113*  CO2 '24 23 22 22 22  ' GLUCOSE 119* 191* 123* 126* 127*  BUN 32* 40* 42* 43* 45*  CREATININE 4.18* 4.75* 5.03* 5.27* 5.50*  CALCIUM 7.6* 7.4* 7.5* 7.6* 7.8*    Liver Function Tests: No results for input(s): AST, ALT, ALKPHOS, BILITOT, PROT, ALBUMIN in the last 168 hours. No results for input(s): LIPASE, AMYLASE in the last 168 hours. No results for input(s): AMMONIA in the last 168 hours.  CBC:  Recent Labs Lab 11/13/16 0804 11/14/16 0538 11/17/16 0546 11/18/16 0453  WBC 11.0* 7.1 7.5 8.3  NEUTROABS 8.3*  --   --   --   HGB 11.3* 9.6*  9.1* 9.1*  HCT 33.0* 27.9* 26.3* 26.7*  MCV 90.1 89.4 90.3 90.6  PLT 284 251 271 276    Cardiac Enzymes:  Recent Labs Lab 11/13/16 0804  TROPONINI 0.03*    BNP: Invalid input(s): POCBNP  CBG:  Recent Labs Lab 11/17/16 1637 11/17/16 2101 11/18/16 0748 11/18/16 1147 11/18/16 1652  GLUCAP 199* 118* 123* 158* 160*    Microbiology: Results for orders placed or performed during the hospital encounter of 10/13/16  Gastrointestinal Panel by PCR , Stool     Status: None   Collection Time: 10/13/16 10:00 AM  Result Value Ref Range Status   Campylobacter species NOT DETECTED NOT DETECTED Final   Plesimonas shigelloides NOT DETECTED NOT DETECTED Final   Salmonella species NOT DETECTED NOT DETECTED Final   Yersinia enterocolitica NOT DETECTED NOT DETECTED Final   Vibrio species NOT DETECTED NOT DETECTED Final   Vibrio cholerae NOT DETECTED NOT DETECTED Final   Enteroaggregative E coli (EAEC) NOT DETECTED NOT DETECTED Final   Enteropathogenic E coli (EPEC) NOT DETECTED NOT DETECTED Final   Enterotoxigenic E coli (ETEC) NOT DETECTED NOT DETECTED Final   Shiga like toxin producing E coli (STEC) NOT DETECTED NOT DETECTED Final   Shigella/Enteroinvasive E coli (EIEC) NOT DETECTED NOT DETECTED Final   Cryptosporidium NOT DETECTED NOT DETECTED Final   Cyclospora cayetanensis NOT DETECTED  NOT DETECTED Final   Entamoeba histolytica NOT DETECTED NOT DETECTED Final   Giardia lamblia NOT DETECTED NOT DETECTED Final   Adenovirus F40/41 NOT DETECTED NOT DETECTED Final   Astrovirus NOT DETECTED NOT DETECTED Final   Norovirus GI/GII NOT DETECTED NOT DETECTED Final   Rotavirus A NOT DETECTED NOT DETECTED Final   Sapovirus (I, II, IV, and V) NOT DETECTED NOT DETECTED Final    Coagulation Studies: No results for input(s): LABPROT, INR in the last 72 hours.  Urinalysis: No results for input(s): COLORURINE, LABSPEC, PHURINE, GLUCOSEU, HGBUR, BILIRUBINUR, KETONESUR, PROTEINUR, UROBILINOGEN,  NITRITE, LEUKOCYTESUR in the last 72 hours.  Invalid input(s): APPERANCEUR    Imaging: No results found.   Medications:    . aspirin EC  81 mg Oral Daily  . atorvastatin  80 mg Oral q1800  . carvedilol  25 mg Oral BID WC  . cilostazol  50 mg Oral BID  . FLUoxetine  40 mg Oral QHS  . heparin  5,000 Units Subcutaneous Q8H  . insulin aspart  0-5 Units Subcutaneous QHS  . insulin aspart  0-9 Units Subcutaneous TID WC  . insulin aspart protamine- aspart  30 Units Subcutaneous BID WC  . isosorbide-hydrALAZINE  1 tablet Oral TID  . pantoprazole  40 mg Oral Daily  . prasugrel  10 mg Oral Daily  . sodium chloride flush  3 mL Intravenous Q12H   acetaminophen **OR** acetaminophen, albuterol, alum & mag hydroxide-simeth, bisacodyl, HYDROcodone-acetaminophen, menthol-cetylpyridinium, nitroGLYCERIN, ondansetron **OR** ondansetron (ZOFRAN) IV, polyethylene glycol  Assessment/ Plan:  56 y.o. male with past medical history of congestive heart failure with normal ejection fraction of 60%, chronic kidney disease stage IV baseline creatinine 2.7, EGFR 24, GERD, hypertension, peripheral vascular disease, diabetes mellitus type 2  1. Acute renal failure on chronic kidney disease stage IV with proteinuria: baseline creatinine 2.7 with EGFR 24 on 03/11/2016.  Chronic kidney disease secondary to diabetes and hypertension. Previously followed with [facility].  Acute renal failure secondary to acute cardiorenal syndrome and overdiuresis.  - Hold IV fluids.  - Continue to monitor renal function and urine output - nonoliguric. No acute indication for dialysis.   2. Anemia of chronic kidney disease. - No indication for ESA therapy  3. Hypertension. Blood pressure better - holding diuretics. Holding lisinopril - amlodipine, carvedilol, BiDil   LOS: 3 Ghislaine Harcum 3/21/20187:50 PM

## 2016-11-18 NOTE — Progress Notes (Signed)
[facility] at [place] NAME: Scott [name]    MR#:  [id number]  DATE OF BIRTH:  08/07/1961  SUBJECTIVE:  CHIEF COMPLAINT:   Chief Complaint  Patient presents with  . Chest Pain  . Shortness of Breath  creat continues to go up (somewhat slower rate though), no symptoms REVIEW OF SYSTEMS:    Review of Systems  Constitutional: Negative for chills and fever.  HENT: Negative for sore throat.   Eyes: Negative for blurred vision, double vision and pain.  Respiratory: Negative for cough, hemoptysis, shortness of breath and wheezing.   Cardiovascular: Negative for chest pain, palpitations, orthopnea and leg swelling.  Gastrointestinal: Negative for abdominal pain, constipation, diarrhea, heartburn, nausea and vomiting.  Genitourinary: Negative for dysuria and hematuria.  Musculoskeletal: Negative for back pain and joint pain.  Skin: Negative for rash.  Neurological: Negative for sensory change, speech change, focal weakness and headaches.  Endo/Heme/Allergies: Does not bruise/bleed easily.  Psychiatric/Behavioral: Negative for depression. The patient is not nervous/anxious.    DRUG ALLERGIES:  No Known Allergies  VITALS:  Blood pressure (!) 143/87, pulse 69, temperature 97.4 F (36.3 C), temperature source Oral, resp. rate 17, height 5\' 10"  (1.778 m), weight 109.7 kg (241 lb 14.4 oz), SpO2 98 %.  PHYSICAL EXAMINATION:   Physical Exam  GENERAL:  56 y.o.-year-old patient lying in the bed with no acute distress.  EYES: Pupils equal, round, reactive to light and accommodation. No scleral icterus. Extraocular muscles intact.  HEENT: Head atraumatic, normocephalic. Oropharynx and nasopharynx clear.  NECK:  Supple, no jugular venous distention. No thyroid enlargement, no tenderness.  LUNGS: Normal breath sounds bilaterally, no wheezing, rales, rhonchi. No use of accessory muscles of respiration.  CARDIOVASCULAR: S1, S2 normal. No murmurs, rubs, or  gallops.  ABDOMEN: Soft, nontender, nondistended. Bowel sounds present. No organomegaly or mass.  EXTREMITIES: No cyanosis, clubbing or edema b/l.    NEUROLOGIC: Cranial nerves II through XII are intact. No focal Motor or sensory deficits b/l.   PSYCHIATRIC: The patient is alert and oriented x 3.  SKIN: No obvious rash, lesion, or ulcer.   LABORATORY PANEL:   CBC  Recent Labs Lab 11/18/16 0453  WBC 8.3  HGB 9.1*  HCT 26.7*  PLT 276   ------------------------------------------------------------------------------------------------------------------ Chemistries   Recent Labs Lab 11/18/16 0453  NA 139  K 4.6  CL 113*  CO2 22  GLUCOSE 127*  BUN 45*  CREATININE 5.50*  CALCIUM 7.8*   ------------------------------------------------------------------------------------------------------------------  Cardiac Enzymes  Recent Labs Lab 11/13/16 0804  TROPONINI 0.03*   ------------------------------------------------------------------------------------------------------------------  RADIOLOGY:  No results found.   ASSESSMENT AND PLAN:   * Acute on chronic diastolic CHF - continue Beta blockers. - Stopped Lasix due to worsening creatinine. - Strict Input and Output - Counseled to limit fluids and Salt - Monitor Bun/Cr and Potassium - Echo repeated and ejection fraction 60%. No significant valvular abnormalities.  * AKI over CKD3 Initially thought to be cardiorenal syndrome. Now creatinine is worsening with diuresis.  Further worsening of creatinine today in spite of holding Lasix and lisinopril and fluids. Monitor input and output.  - Creatinine 5.27 -> 5.5 Discussed with patient regarding need for dialysis if there is continued worsening. Not quite need for HD per nephro  * Insulin dependent diabetes mellitus. Continue patient's home  insulin with reduced dose. Sliding scale insulin. - DM nurse c/s  * Accelerated Hypertension - improved Continue home  meds  * DVT prophylaxis Heparin  All the records are reviewed and case discussed with Care Management/Social Workerr. Management plans discussed with the patient, Dr Juleen China and they are in agreement.  CODE STATUS: FULL CODE  DVT Prophylaxis: SCDs  TOTAL TIME TAKING CARE OF THIS PATIENT: 15 minutes.   POSSIBLE D/C IN 1-2 DAYS, DEPENDING ON CLINICAL CONDITION. And renal function, need to wait for creat to plateau or improve some before D/C  Max Sane M.D on 11/18/2016 at 11:52 AM  Between 7am to 6pm - Pager - [phone]  After 6pm go to [email or web address] - password [email or web address] [facility]  Office  [phone]  CC: Primary care physician; [place]  Note: This dictation was prepared with Dragon dictation along with smaller phrase technology. Any transcriptional errors that result from this process are unintentional.

## 2016-11-18 NOTE — Progress Notes (Signed)
Inpatient Diabetes Program Recommendations  AACE/ADA: New Consensus Statement on Inpatient Glycemic Control (2015)  Target Ranges:  Prepandial:   less than 140 mg/dL      Peak postprandial:   less than 180 mg/dL (1-2 hours)      Critically ill patients:  140 - 180 mg/dL   Lab Results  Component Value Date   GLUCAP 158 (H) 11/18/2016   HGBA1C 7.0 (H) 11/13/2016   Review of Glycemic Control  Diabetes history: DM 2 Outpatient Diabetes medications: [place] 5 mcg BID, 70/30 40 units BID Current orders for Inpatient glycemic control: Novolog 0-9 units tid +Novolog 0-5 units qhs  Inpatient Diabetes Program Recommendations:     70/30 insulin dose held this am per MD. Patient with rising renal function. Glucose controlled with Novolog 0-9 tid (123, 158 mg/dl). Yesterday glucose trends also good with 30 units given of 70/30 BID.  Consider adjusting 70/30 insulin to 20 units BID.  Will monitor trends while here.  Thanks,  Tama Headings RN, MSN, [facility] Inpatient Diabetes Coordinator Team Pager [phone] (8a-5p)

## 2016-11-18 NOTE — Care Management Important Message (Signed)
Important Message  Patient Details  Name: Scott [name] MRN: [id number] Date of Birth: 1961-03-30   Medicare Important Message Given:  Yes    Beverly Sessions, RN 11/18/2016, 1:19 PM

## 2016-11-18 NOTE — Progress Notes (Signed)
Notified MD of blood glucose of 123. Per MD hold 70/30 and give sliding scale coverage. Will recheck at lunch time

## 2016-11-19 LAB — BASIC METABOLIC PANEL
Anion gap: 6 (ref 5–15)
BUN: 46 mg/dL — AB (ref 6–20)
CO2: 22 mmol/L (ref 22–32)
CREATININE: 5.44 mg/dL — AB (ref 0.61–1.24)
Calcium: 7.9 mg/dL — ABNORMAL LOW (ref 8.9–10.3)
Chloride: 112 mmol/L — ABNORMAL HIGH (ref 101–111)
GFR calc Af Amer: 12 mL/min — ABNORMAL LOW (ref >60)
GFR calc non Af Amer: 10 mL/min — ABNORMAL LOW (ref >60)
GLUCOSE: 107 mg/dL — AB (ref 65–99)
Potassium: 5 mmol/L (ref 3.5–5.1)
SODIUM: 140 mmol/L (ref 135–145)

## 2016-11-19 LAB — GLUCOSE, CAPILLARY
GLUCOSE-CAPILLARY: 162 mg/dL — AB (ref 65–99)
GLUCOSE-CAPILLARY: 183 mg/dL — AB (ref 65–99)
Glucose-Capillary: 106 mg/dL — ABNORMAL HIGH (ref 65–99)
Glucose-Capillary: 54 mg/dL — ABNORMAL LOW (ref 65–99)
Glucose-Capillary: 66 mg/dL (ref 65–99)

## 2016-11-19 LAB — CBC
HCT: 27.5 % — ABNORMAL LOW (ref 40.0–52.0)
Hemoglobin: 9.5 g/dL — ABNORMAL LOW (ref 13.0–18.0)
MCH: 31.2 pg (ref 26.0–34.0)
MCHC: 34.4 g/dL (ref 32.0–36.0)
MCV: 90.7 fL (ref 80.0–100.0)
PLATELETS: 285 10*3/uL (ref 150–440)
RBC: 3.03 MIL/uL — AB (ref 4.40–5.90)
RDW: 14 % (ref 11.5–14.5)
WBC: 8.3 10*3/uL (ref 3.8–10.6)

## 2016-11-19 MED ORDER — INSULIN ASPART PROT & ASPART (70-30 MIX) 100 UNIT/ML ~~LOC~~ SUSP
20.0000 [IU] | Freq: Two times a day (BID) | SUBCUTANEOUS | Status: DC
  Administered 2016-11-19 – 2016-11-20 (×2): 20 [IU] via SUBCUTANEOUS
  Filled 2016-11-19 (×2): qty 20

## 2016-11-19 MED ORDER — LIVING WELL WITH DIABETES BOOK
Freq: Once | Status: DC
  Filled 2016-11-19 (×2): qty 1

## 2016-11-19 NOTE — Progress Notes (Signed)
[facility] [place] NAME: Scott [name]    MR#:  [id number]  DATE OF BIRTH:  [birth year]  SUBJECTIVE:  CHIEF COMPLAINT:   Chief Complaint  Patient presents with  . Chest Pain  . Shortness of Breath  creat Finally plateaued.  No symptoms REVIEW OF SYSTEMS:    Review of Systems  Constitutional: Negative for chills and fever.  HENT: Negative for sore throat.   Eyes: Negative for blurred vision, double vision and pain.  Respiratory: Negative for cough, hemoptysis, shortness of breath and wheezing.   Cardiovascular: Negative for chest pain, palpitations, orthopnea and leg swelling.  Gastrointestinal: Negative for abdominal pain, constipation, diarrhea, heartburn, nausea and vomiting.  Genitourinary: Negative for dysuria and hematuria.  Musculoskeletal: Negative for back pain and joint pain.  Skin: Negative for rash.  Neurological: Negative for sensory change, speech change, focal weakness and headaches.  Endo/Heme/Allergies: Does not bruise/bleed easily.  Psychiatric/Behavioral: Negative for depression. The patient is not nervous/anxious.    DRUG ALLERGIES:  No Known Allergies  VITALS:  Blood pressure 110/61, pulse 67, temperature 97.4 F (36.3 C), temperature source Oral, resp. rate 20, height 5\' 10"  (1.778 m), weight 108.5 kg (239 lb 3.2 oz), SpO2 97 %.  PHYSICAL EXAMINATION:   Physical Exam  GENERAL:  56 y.o.-year-old patient lying in the bed with no acute distress.  EYES: Pupils equal, round, reactive to light and accommodation. No scleral icterus. Extraocular muscles intact.  HEENT: Head atraumatic, normocephalic. Oropharynx and nasopharynx clear.  NECK:  Supple, no jugular venous distention. No thyroid enlargement, no tenderness.  LUNGS: Normal breath sounds bilaterally, no wheezing, rales, rhonchi. No use of accessory muscles of respiration.  CARDIOVASCULAR: S1, S2 normal. No murmurs, rubs, or gallops.  ABDOMEN: Soft,  nontender, nondistended. Bowel sounds present. No organomegaly or mass.  EXTREMITIES: No cyanosis, clubbing or edema b/l.    NEUROLOGIC: Cranial nerves II through XII are intact. No focal Motor or sensory deficits b/l.   PSYCHIATRIC: The patient is alert and oriented x 3.  SKIN: No obvious rash, lesion, or ulcer.   LABORATORY PANEL:   CBC  Recent Labs Lab 11/19/16 0417  WBC 8.3  HGB 9.5*  HCT 27.5*  PLT 285   ------------------------------------------------------------------------------------------------------------------ Chemistries   Recent Labs Lab 11/19/16 0417  NA 140  K 5.0  CL 112*  CO2 22  GLUCOSE 107*  BUN 46*  CREATININE 5.44*  CALCIUM 7.9*   ------------------------------------------------------------------------------------------------------------------  Cardiac Enzymes  Recent Labs Lab 11/13/16 0804  TROPONINI 0.03*   ------------------------------------------------------------------------------------------------------------------  RADIOLOGY:  No results found.   ASSESSMENT AND PLAN:   * Acute on chronic diastolic CHF - continue Beta blockers. - Stopped Lasix due to worsening creatinine. - Strict Input and Output - Counseled to limit fluids and Salt - Monitor Bun/Cr and Potassium - Echo repeated and ejection fraction 60%. No significant valvular abnormalities.  * AKI over CKD3 Initially thought to be cardiorenal syndrome. Now creatinine is worsening with diuresis.  Further worsening of creatinine today in spite of holding Lasix and lisinopril and fluids. Monitor input and output.  - Creatinine 5.27 -> 5.5->5.44 - will await for repeat creatinine in the morning to make sure it has continues to go down after which he will be discharged  * Insulin dependent diabetes mellitus.  - Decrease insulin 70/30 to 20 units twice a day. - DM nurse c/s  * Accelerated Hypertension - improved Continue home meds  * DVT  prophylaxis Heparin  All the records are reviewed and case discussed with Care Management/Social Workerr. Management plans discussed with the patient, Dr Juleen China and they are in agreement.  CODE STATUS: FULL CODE  DVT Prophylaxis: SCDs  TOTAL TIME TAKING CARE OF THIS PATIENT: 15 minutes.   POSSIBLE D/C IN 1 DAYS, DEPENDING ON CLINICAL CONDITION. And renal function, need to wait for creat to plateau or improve some before D/C  Max Sane M.D on 11/19/2016 at 2:28 PM  Between 7am to 6pm - Pager - [phone]  After 6pm go to [email or web address] - password [email or web address]  Office  [phone]  CC: Primary care physician; [place]  Note: This dictation was prepared with Dragon dictation along with smaller phrase technology. Any transcriptional errors that result from this process are unintentional.

## 2016-11-19 NOTE — Progress Notes (Signed)
[facility]  ROUNDING NOTE   Subjective:  .  Creatinine 5.44 (5.5) (5.27) (5.03) (4.75)  Objective:  Vital signs in last 24 hours:  Temp:  [97.4 F (36.3 C)-97.9 F (36.6 C)] 97.4 F (36.3 C) (03/22 1357) Pulse Rate:  [67-74] 67 (03/22 1357) Resp:  [16-20] 20 (03/22 1357) BP: (110-161)/(61-78) 110/61 (03/22 1357) SpO2:  [97 %-100 %] 97 % (03/22 1357) Weight:  [108.5 kg (239 lb 3.2 oz)] 108.5 kg (239 lb 3.2 oz) (03/22 0500)  Weight change: -1.225 kg (-2 lb 11.2 oz) Filed Weights   11/17/16 0452 11/17/16 2047 11/19/16 0500  Weight: 109.1 kg (240 lb 8 oz) 109.7 kg (241 lb 14.4 oz) 108.5 kg (239 lb 3.2 oz)    Intake/Output: I/O last 3 completed shifts: In: 45 [P.O.:960] Out: 1200 [Urine:1200]   Intake/Output this shift:  Total I/O In: 582 [P.O.:582] Out: 925 [Urine:925]  Physical Exam: General: No acute distress  Head: Normocephalic, atraumatic. Moist oral mucosal membranes  Eyes: Anicteric  Neck: Supple, trachea midline  Lungs:  Clear to auscultation, normal effort  Heart: S1S2 no rubs  Abdomen:  Soft, nontender, bowel sounds present   Extremities: No peripheral edema.  Neurologic: Nonfocal, moving all four extremities  Skin: No lesions       Basic Metabolic Panel:  Recent Labs Lab 11/15/16 0622 11/16/16 0533 11/17/16 0546 11/18/16 0453 11/19/16 0417  NA 139 140 138 139 140  K 4.0 4.0 4.4 4.6 5.0  CL 108 110 110 113* 112*  CO2 _0 GLUCOSE 191* 123* 126* 127* 107*  BUN 40* 42* 43* 45* 46*  CREATININE 4.75* 5.03* 5.27* 5.50* 5.44*  CALCIUM 7.4* 7.5* 7.6* 7.8* 7.9*    Liver Function Tests: No results for input(s): AST, ALT, ALKPHOS, BILITOT, PROT, ALBUMIN in the last 168 hours. No results for input(s): LIPASE, AMYLASE in the last 168 hours. No results for input(s): AMMONIA in the last 168 hours.  CBC:  Recent Labs Lab 11/13/16 0804 11/14/16 0538 11/17/16 0546 11/18/16 0453 11/19/16 0417  WBC 11.0* 7.1 7.5 8.3 8.3   NEUTROABS 8.3*  --   --   --   --   HGB 11.3* 9.6* 9.1* 9.1* 9.5*  HCT 33.0* 27.9* 26.3* 26.7* 27.5*  MCV 90.1 89.4 90.3 90.6 90.7  PLT 284 251 271 276 285    Cardiac Enzymes:  Recent Labs Lab 11/13/16 0804  TROPONINI 0.03*    BNP: Invalid input(s): POCBNP  CBG:  Recent Labs Lab 11/18/16 1652 11/18/16 2107 11/19/16 0746 11/19/16 1148 11/19/16 1209  GLUCAP 160* 106* 162* 49* 106*    Microbiology: Results for orders placed or performed during the hospital encounter of 10/13/16  Gastrointestinal Panel by PCR , Stool     Status: None   Collection Time: 10/13/16 10:00 AM  Result Value Ref Range Status   Campylobacter species NOT DETECTED NOT DETECTED Final   Plesimonas shigelloides NOT DETECTED NOT DETECTED Final   Salmonella species NOT DETECTED NOT DETECTED Final   Yersinia enterocolitica NOT DETECTED NOT DETECTED Final   Vibrio species NOT DETECTED NOT DETECTED Final   Vibrio cholerae NOT DETECTED NOT DETECTED Final   Enteroaggregative E coli (EAEC) NOT DETECTED NOT DETECTED Final   Enteropathogenic E coli (EPEC) NOT DETECTED NOT DETECTED Final   Enterotoxigenic E coli (ETEC) NOT DETECTED NOT DETECTED Final   Shiga like toxin producing E coli (STEC) NOT DETECTED NOT DETECTED Final   Shigella/Enteroinvasive E coli (EIEC) NOT DETECTED NOT DETECTED  Final   Cryptosporidium NOT DETECTED NOT DETECTED Final   Cyclospora cayetanensis NOT DETECTED NOT DETECTED Final   Entamoeba histolytica NOT DETECTED NOT DETECTED Final   Giardia lamblia NOT DETECTED NOT DETECTED Final   Adenovirus F40/41 NOT DETECTED NOT DETECTED Final   Astrovirus NOT DETECTED NOT DETECTED Final   Norovirus GI/GII NOT DETECTED NOT DETECTED Final   Rotavirus A NOT DETECTED NOT DETECTED Final   Sapovirus (I, II, IV, and V) NOT DETECTED NOT DETECTED Final    Coagulation Studies: No results for input(s): LABPROT, INR in the last 72 hours.  Urinalysis: No results for input(s): COLORURINE, LABSPEC,  PHURINE, GLUCOSEU, HGBUR, BILIRUBINUR, KETONESUR, PROTEINUR, UROBILINOGEN, NITRITE, LEUKOCYTESUR in the last 72 hours.  Invalid input(s): APPERANCEUR    Imaging: No results found.   Medications:    . aspirin EC  81 mg Oral Daily  . atorvastatin  80 mg Oral q1800  . carvedilol  25 mg Oral BID WC  . cilostazol  50 mg Oral BID  . FLUoxetine  40 mg Oral QHS  . heparin  5,000 Units Subcutaneous Q8H  . insulin aspart  0-5 Units Subcutaneous QHS  . insulin aspart  0-9 Units Subcutaneous TID WC  . insulin aspart protamine- aspart  20 Units Subcutaneous BID WC  . isosorbide-hydrALAZINE  1 tablet Oral TID  . living well with diabetes book   Does not apply Once  . pantoprazole  40 mg Oral Daily  . prasugrel  10 mg Oral Daily  . sodium chloride flush  3 mL Intravenous Q12H   acetaminophen **OR** acetaminophen, albuterol, alum & mag hydroxide-simeth, bisacodyl, HYDROcodone-acetaminophen, menthol-cetylpyridinium, nitroGLYCERIN, ondansetron **OR** ondansetron (ZOFRAN) IV, polyethylene glycol  Assessment/ Plan:  56 y.o. male with past medical history of congestive heart failure with normal ejection fraction of 60%, chronic kidney disease stage IV baseline creatinine 2.7, EGFR 24, GERD, hypertension, peripheral vascular disease, diabetes mellitus type 2  1. Acute renal failure on chronic kidney disease stage IV with proteinuria: baseline creatinine 2.7 with EGFR 24 on 03/11/2016.  Chronic kidney disease secondary to diabetes and hypertension. Previously followed with [facility].  Acute renal failure secondary to acute cardiorenal syndrome and overdiuresis.  - Hold IV fluids.  - Continue to monitor renal function and urine output - nonoliguric. No acute indication for dialysis.   2. Anemia of chronic kidney disease. - No indication for ESA therapy  3. Hypertension. Blood pressure better - holding diuretics. Holding lisinopril - amlodipine, carvedilol, BiDil  Follow up Appointment with  Dr. Juleen China on 3/29 at 10:30 in [facility].    LOS: [place], [place] 3/22/20182:32 PM

## 2016-11-19 NOTE — Care Management (Signed)
have reached out to Scott [name] with [facility] to see if agency could accept nursing referral

## 2016-11-19 NOTE — Progress Notes (Addendum)
Inpatient Diabetes Program Recommendations  AACE/ADA: New Consensus Statement on Inpatient Glycemic Control (2015)  Target Ranges:  Prepandial:   less than 140 mg/dL      Peak postprandial:   less than 180 mg/dL (1-2 hours)      Critically ill patients:  140 - 180 mg/dL   Lab Results  Component Value Date   GLUCAP 162 (H) 11/19/2016   HGBA1C 7.0 (H) 11/13/2016    Review of Glycemic Control  Results for JUNIEL, GROENE (MRN [id number]) as of 11/19/2016 10:14  Ref. Range 11/18/2016 07:48 11/18/2016 11:47 11/18/2016 16:52 11/18/2016 21:07 11/19/2016 07:46  Glucose-Capillary Latest Ref Range: 65 - 99 mg/dL 123 (H) 158 (H) 160 (H) 106 (H) 162 (H)    Diabetes history: DM 2 Outpatient Diabetes medications: Byetta 5 mcg BID, 70/30 40 units BID Current orders for Inpatient glycemic control: Novolog 0-9 units tid +Novolog 0-5 units qhs, Novolog mix 70/30 30 units bid  Inpatient Diabetes Program Recommendations:     Consider decreasing Novolog 70/30 to 25 units bid .   Spoke with patient at the bedside.  He reports that his MD recently changed him from Byetta 35mcg bid to 5 mcg bid (he has not started it) and to decrease from 70/30 insulin 45units bid to 30 units bid.  Patient tells me he checks CBG 2-3 times per day.  Low blood sugar while I was in the room with him.  Treated with 4 oz of regular soda. Consider decreasing Novolog 70/30 to 20 units bid. Discussed with RN Pryor Montes.   Gentry Fitz, RN, BA, MHA, CDE Diabetes Coordinator Inpatient Diabetes Program  [phone] (Team Pager) [phone] ([place]) 11/19/2016 10:15 AM

## 2016-11-20 LAB — BASIC METABOLIC PANEL
ANION GAP: 6 (ref 5–15)
BUN: 47 mg/dL — ABNORMAL HIGH (ref 6–20)
CHLORIDE: 112 mmol/L — AB (ref 101–111)
CO2: 21 mmol/L — AB (ref 22–32)
Calcium: 7.8 mg/dL — ABNORMAL LOW (ref 8.9–10.3)
Creatinine, Ser: 5.44 mg/dL — ABNORMAL HIGH (ref 0.61–1.24)
GFR calc non Af Amer: 10 mL/min — ABNORMAL LOW (ref >60)
GFR, EST AFRICAN AMERICAN: 12 mL/min — AB (ref >60)
GLUCOSE: 195 mg/dL — AB (ref 65–99)
Potassium: 4.9 mmol/L (ref 3.5–5.1)
Sodium: 139 mmol/L (ref 135–145)

## 2016-11-20 LAB — CBC
HEMATOCRIT: 26.4 % — AB (ref 40.0–52.0)
HEMOGLOBIN: 9.1 g/dL — AB (ref 13.0–18.0)
MCH: 31 pg (ref 26.0–34.0)
MCHC: 34.3 g/dL (ref 32.0–36.0)
MCV: 90.3 fL (ref 80.0–100.0)
Platelets: 293 10*3/uL (ref 150–440)
RBC: 2.92 MIL/uL — AB (ref 4.40–5.90)
RDW: 13.8 % (ref 11.5–14.5)
WBC: 8.8 10*3/uL (ref 3.8–10.6)

## 2016-11-20 LAB — GLUCOSE, CAPILLARY: GLUCOSE-CAPILLARY: 153 mg/dL — AB (ref 65–99)

## 2016-11-20 NOTE — Discharge Instructions (Signed)
Heart Failure Clinic appointment on November 25, 2016 at 8:40am with Scott [name], [place]. Please call [phone] to reschedule.    Acute Kidney Injury, Adult Acute kidney injury is a sudden worsening of kidney function. The kidneys are organs that have several jobs. They filter the blood to remove waste products and extra fluid. They also maintain a healthy balance of minerals and hormones in the body, which helps control blood pressure and keep bones strong. With this condition, your kidneys do not do their jobs as well as they should. This condition ranges from mild to severe. Over time it may develop into long-lasting (chronic) kidney disease. Early detection and treatment may prevent acute kidney injury from developing into a chronic condition. What are the causes? Common causes of this condition include:  A problem with blood flow to the kidneys. This may be caused by:  Low blood pressure (hypotension) or shock.  Blood loss.  Heart and blood vessel (cardiovascular) disease.  Severe burns.  Liver disease.  Direct damage to the kidneys. This may be caused by:  Certain medicines.  A kidney infection.  Poisoning.  Being around or in contact with toxic substances.  A surgical wound.  A hard, direct hit to the kidney area.  A sudden blockage of urine flow. This may be caused by:  Cancer.  Kidney stones.  An enlarged prostate in males. What are the signs or symptoms? Symptoms of this condition may not be obvious until the condition becomes severe. Symptoms of this condition can include:  Tiredness (lethargy), or difficulty staying awake.  Nausea or vomiting.  Swelling (edema) of the face, legs, ankles, or feet.  Problems with urination, such as:  Abdominal pain, or pain along the side of your stomach (flank).  Decreased urine production.  Decrease in the force of urine flow.  Muscle twitches and cramps, especially in the legs.  Confusion or trouble  concentrating.  Loss of appetite.  Fever. How is this diagnosed? This condition may be diagnosed with tests, including:  Blood tests.  Urine tests.  Imaging tests.  A test in which a sample of tissue is removed from the kidneys to be examined under a microscope (kidney biopsy). How is this treated? Treatment for this condition depends on the cause and how severe the condition is. In mild cases, treatment may not be needed. The kidneys may heal on their own. In more severe cases, treatment will involve:  Treating the cause of the kidney injury. This may involve changing any medicines you are taking or adjusting your dosage.  Fluids. You may need specialized IV fluids to balance your body's needs.  Having a catheter placed to drain urine and prevent blockages.  Preventing problems from occurring. This may mean avoiding certain medicines or procedures that can cause further injury to the kidneys. In some cases treatment may also require:  A procedure to remove toxic wastes from the body (dialysis or continuous renal replacement therapy - CRRT).  Surgery. This may be done to repair a torn kidney, or to remove the blockage from the urinary system. Follow these instructions at home: Medicines   Take over-the-counter and prescription medicines only as told by your health care provider.  Do not take any new medicines without your health care provider's approval. Many medicines can worsen your kidney damage.  Do not take any vitamin and mineral supplements without your health care provider's approval. Many nutritional supplements can worsen your kidney damage. Lifestyle   If your health care provider prescribed  changes to your diet, follow them. You may need to decrease the amount of protein you eat.  Achieve and maintain a healthy weight. If you need help with this, ask your health care provider.  Start or continue an exercise plan. Try to exercise at least 30 minutes a day, 5  days a week.  Do not use any tobacco products, such as cigarettes, chewing tobacco, and e-cigarettes. If you need help quitting, ask your health care provider. General instructions   Keep track of your blood pressure. Report changes in your blood pressure as told by your health care provider.  Stay up to date with immunizations. Ask your health care provider which immunizations you need.  Keep all follow-up visits as told by your health care provider. This is important. Where to find more information:  American Association of Kidney Patients: BombTimer.gl  National Kidney Foundation: www.kidney.[place]: https://mathis.com/  Life [facility]:  www.lifeoptions.org  www.kidneyschool.org Contact a health care provider if:  Your symptoms get worse.  You develop new symptoms. Get help right away if:  You develop symptoms of worsening kidney disease, which include:  Headaches.  Abnormally dark or light skin.  Easy bruising.  Frequent hiccups.  Chest pain.  Shortness of breath.  End of menstruation in women.  Seizures.  Confusion or altered mental status.  Abdominal or back pain.  Itchiness.  You have a fever.  Your body is producing less urine.  You have pain or bleeding when you urinate. Summary  Acute kidney injury is a sudden worsening of kidney function.  Acute kidney injury can be caused by problems with blood flow to the kidneys, direct damage to the kidneys, and sudden blockage of urine flow.  Symptoms of this condition may not be obvious until it becomes severe. Symptoms may include edema, lethargy, confusion, nausea or vomiting, and problems passing urine.  This condition can usually be diagnosed with blood tests, urine tests, and imaging tests. Sometimes a kidney biopsy is done to diagnose this condition.  Treatment for this condition often involves treating the underlying cause. It is treated with fluids,  medicines, dialysis, diet changes, or surgery. This information is not intended to replace advice given to you by your health care provider. Make sure you discuss any questions you have with your health care provider. Document Released: 03/02/2011 Document Revised: 08/07/2016 Document Reviewed: 08/07/2016 Elsevier Interactive Patient Education  2017 [employer].

## 2016-11-20 NOTE — Progress Notes (Signed)
Alert and oriented. Vital signs stable . No signs of acute distress. Discharge instructions given. Patient verbalized understanding. No other issues noted at this time.    

## 2016-11-20 NOTE — Care Management (Signed)
Faxed Home health orders to [facility] for nursing, CHF glycemic follow up

## 2016-11-22 NOTE — Discharge Summary (Signed)
Scott [name] at [place] NAME: Scott [name]    MR#:  [id number]  DATE OF BIRTH:  12/10/1960  DATE OF ADMISSION:  11/13/2016   ADMITTING PHYSICIAN: Hillary Bow, MD  DATE OF DISCHARGE: 11/20/2016  9:51 AM  PRIMARY CARE PHYSICIAN: [name]   ADMISSION DIAGNOSIS:  Acute on chronic congestive heart failure, unspecified congestive heart failure type (HCC) [I50.9] Acute renal failure superimposed on chronic kidney disease, unspecified CKD stage, unspecified acute renal failure type ([place]) [N17.9, N18.9] DISCHARGE DIAGNOSIS:  Active Problems:   CHF (congestive heart failure) (HCC)   Acute renal failure (ARF) ([place])  SECONDARY DIAGNOSIS:   Past Medical History:  Diagnosis Date  . Arthritis    "left arm; right leg" (12/13/2014)  . Asthma   . CHF (congestive heart failure) ([name])   . Chronic disease anemia    [name] 12/13/2014  . Chronic kidney disease (CKD), stage IV (severe) ([place])    [name] 12/13/2014  . Coronary artery disease    [name] 12/13/2014  . Depression   . Dysrhythmia   . GERD (gastroesophageal reflux disease)   . High cholesterol    [name] 12/13/2014  . Hypertension   . Non-Q wave myocardial infarction ([place])    [name] 12/13/2014  . PVD (peripheral vascular disease) ([place])    [name] 12/13/2014  . Type II diabetes mellitus [facility])    HOSPITAL COURSE:  [name]  is a 56 y.o. male with a known history of diatolic chf, HTN, CKD3 here with sob of 3 weeks with worsening in last 2-3 days. Leg swelling. No chest pain  * Acute on chronic diastolic CHF - Diuresed well. Well compensated at the time of discharge. - Diuretics held due to worsening kidney function - Echo repeated and ejection fraction 60%. No significant valvular abnormalities.  * Acute renal failure on chronic kidney disease stage IV with proteinuria - Creatinine 5.44 on the day of discharge but trending down and making good urine outpt -  recommended to hold his diuretics and ARB.  * Accelerated Hypertension - improved DISCHARGE CONDITIONS:  stable CONSULTS OBTAINED:  Treatment Team:  Munsoor Holley Raring, MD DRUG ALLERGIES:  No Known Allergies DISCHARGE MEDICATIONS:   Allergies as of 11/20/2016   No Known Allergies     Medication List    STOP taking these medications   furosemide 20 MG tablet Commonly known as:  LASIX   lisinopril 10 MG tablet Commonly known as:  PRINIVIL,ZESTRIL   spironolactone 25 MG tablet Commonly known as:  ALDACTONE     TAKE these medications   albuterol 108 (90 Base) MCG/ACT inhaler Commonly known as:  PROVENTIL HFA;VENTOLIN HFA Inhale 2 puffs into the lungs every 6 (six) hours as needed for wheezing or shortness of breath.   amLODipine 10 MG tablet Commonly known as:  NORVASC Take 10 mg by mouth daily.   aspirin EC 81 MG tablet Take 1 tablet (81 mg total) by mouth daily.   atorvastatin 80 MG tablet Commonly known as:  LIPITOR Take 1 tablet (80 mg total) by mouth daily at 6 PM.   carvedilol 25 MG tablet Commonly known as:  COREG Take 1 tablet (25 mg total) by mouth 2 (two) times daily with a meal.   cetirizine 10 MG tablet Commonly known as:  ZYRTEC Take 10 mg by mouth as needed for allergies (1 tablet PRN daily.).   cilostazol 50 MG tablet Commonly known as:  PLETAL Take 50 mg by mouth  2 (two) times daily.   diphenhydrAMINE 25 mg capsule Commonly known as:  BENADRYL Take 1-2 tablets by mouth every 6 hours as needed   exenatide 5 MCG/0.02ML Sopn injection Commonly known as:  BYETTA Inject 5 mcg into the skin 2 (two) times daily.   ferrous sulfate 325 (65 FE) MG tablet Take 1 tablet (325 mg total) by mouth 3 (three) times daily with meals.   FLUoxetine 40 MG capsule Commonly known as:  PROZAC Take 40 mg by mouth at bedtime.   gabapentin 100 MG capsule Commonly known as:  NEURONTIN Take 1 capsule (100 mg total) by mouth 3 (three) times daily.   insulin aspart  protamine- aspart (70-30) 100 UNIT/ML injection Commonly known as:  NOVOLOG MIX 70/30 Inject 0.35 mLs (35 Units total) into the skin 2 (two) times daily with a meal. What changed:  how much to take   isosorbide-hydrALAZINE 20-37.5 MG tablet Commonly known as:  BIDIL Take 1 tablet by mouth 3 (three) times daily.   loratadine 10 MG tablet Commonly known as:  CLARITIN Take 1 tablet (10 mg total) by mouth daily as needed for allergies.   nitroGLYCERIN 0.4 MG SL tablet Commonly known as:  NITROSTAT Place 1 tablet (0.4 mg total) under the tongue every 5 (five) minutes x 3 doses as needed for chest pain.   omeprazole 20 MG capsule Commonly known as:  PRILOSEC Take 1 capsule (20 mg total) by mouth daily.   prasugrel 10 MG Tabs tablet Commonly known as:  EFFIENT Take 1 tablet (10 mg total) by mouth daily.   rosuvastatin 40 MG tablet Commonly known as:  CRESTOR Take 40 mg by mouth daily.   VICTOZA 18 MG/3ML Sopn Generic drug:  liraglutide Inject 1.8 mg into the skin daily. 0.6mg  daily x 1 week, then 1.2mg  daily one week, then 1.8mg  daily.   Vitamin D 2000 units tablet Take 2,000 Units by mouth daily.        DISCHARGE INSTRUCTIONS:   DIET:  Regular diet DISCHARGE CONDITION:  Good ACTIVITY:  Activity as tolerated OXYGEN:  Home Oxygen: No.  Oxygen Delivery: room air DISCHARGE LOCATION:  home   If you experience worsening of your admission symptoms, develop shortness of breath, life threatening emergency, suicidal or homicidal thoughts you must seek medical attention immediately by calling 911 or calling your MD immediately  if symptoms less severe.  You Must read complete instructions/literature along with all the possible adverse reactions/side effects for all the Medicines you take and that have been prescribed to you. Take any new Medicines after you have completely understood and accpet all the possible adverse reactions/side effects.   Please note  You were cared  for by a hospitalist during your hospital stay. If you have any questions about your discharge medications or the care you received while you were in the hospital after you are discharged, you can call the unit and asked to speak with the hospitalist on call if the hospitalist that took care of you is not available. Once you are discharged, your primary care physician will handle any further medical issues. Please note that NO REFILLS for any discharge medications will be authorized once you are discharged, as it is imperative that you return to your primary care physician (or establish a relationship with a primary care physician if you do not have one) for your aftercare needs so that they can reassess your need for medications and monitor your lab values.    On the day of  Discharge:  VITAL SIGNS:  Blood pressure (!) 157/80, pulse 73, temperature 97.8 F (36.6 C), temperature source Oral, resp. rate 18, height 5\' 10"  (1.778 m), weight 108 kg (238 lb 1.6 oz), SpO2 100 %. PHYSICAL EXAMINATION:  GENERAL:  56 y.o.-year-old patient lying in the bed with no acute distress.  EYES: Pupils equal, round, reactive to light and accommodation. No scleral icterus. Extraocular muscles intact.  HEENT: Head atraumatic, normocephalic. Oropharynx and nasopharynx clear.  NECK:  Supple, no jugular venous distention. No thyroid enlargement, no tenderness.  LUNGS: Normal breath sounds bilaterally, no wheezing, rales,rhonchi or crepitation. No use of accessory muscles of respiration.  CARDIOVASCULAR: S1, S2 normal. No murmurs, rubs, or gallops.  ABDOMEN: Soft, non-tender, non-distended. Bowel sounds present. No organomegaly or mass.  EXTREMITIES: No pedal edema, cyanosis, or clubbing.  NEUROLOGIC: Cranial nerves II through XII are intact. Muscle strength 5/5 in all extremities. Sensation intact. Gait not checked.  PSYCHIATRIC: The patient is alert and oriented x 3.  SKIN: No obvious rash, lesion, or ulcer.  DATA  REVIEW:   CBC  Recent Labs Lab 11/20/16 0446  WBC 8.8  HGB 9.1*  HCT 26.4*  PLT 293    Chemistries   Recent Labs Lab 11/20/16 0446  NA 139  K 4.9  CL 112*  CO2 21*  GLUCOSE 195*  BUN 47*  CREATININE 5.44*  CALCIUM 7.8*     Follow-up Information    Alisa Graff, FNP. Go on 11/25/2016.   Specialty:  Family Medicine Why:  at 8:40am to the [facility] Contact information: [address] [place] [address] [phone]        Lavonia Dana, MD. Go on 11/26/2016.   Specialty:  Internal Medicine Why:  This appointment will be at [facility], [address] [place]  Thursday, March 29 at 10:30 a.m.  [phone] Contact information: [place] [address] [phone]        [facility] Schedule an appointment as soon as possible for a visit in 2 week(s).   Contact information: [place] [address] [place] Follow up.   Specialty:  Home Health Services Why:  Home health nurse Contact information: [place] [address] [phone]           Management plans discussed with the patient, family and they are in agreement.  CODE STATUS: Prior   TOTAL TIME TAKING CARE OF THIS PATIENT: 45 minutes.    Max Sane M.D on 11/22/2016 at 5:58 PM  Between 7am to 6pm - Pager - [phone]  After 6pm go to [email or web address] - Technical brewer [place]  Office  [phone]  CC: Primary care physician; [place]   Note: This dictation was prepared with Dragon dictation along with smaller phrase technology. Any transcriptional errors that result from this process are unintentional.

## 2016-11-25 ENCOUNTER — Encounter: Payer: Self-pay | Admitting: Family

## 2016-11-25 ENCOUNTER — Ambulatory Visit: Payer: Medicare Other | Attending: Family | Admitting: Family

## 2016-11-25 VITALS — BP 139/68 | HR 71 | Resp 18 | Ht 70.0 in | Wt 242.1 lb

## 2016-11-25 DIAGNOSIS — E1122 Type 2 diabetes mellitus with diabetic chronic kidney disease: Secondary | ICD-10-CM | POA: Diagnosis not present

## 2016-11-25 DIAGNOSIS — Z794 Long term (current) use of insulin: Secondary | ICD-10-CM

## 2016-11-25 DIAGNOSIS — Z09 Encounter for follow-up examination after completed treatment for conditions other than malignant neoplasm: Secondary | ICD-10-CM | POA: Diagnosis not present

## 2016-11-25 DIAGNOSIS — N184 Chronic kidney disease, stage 4 (severe): Secondary | ICD-10-CM | POA: Insufficient documentation

## 2016-11-25 DIAGNOSIS — E785 Hyperlipidemia, unspecified: Secondary | ICD-10-CM | POA: Diagnosis not present

## 2016-11-25 DIAGNOSIS — I252 Old myocardial infarction: Secondary | ICD-10-CM | POA: Insufficient documentation

## 2016-11-25 DIAGNOSIS — F329 Major depressive disorder, single episode, unspecified: Secondary | ICD-10-CM | POA: Insufficient documentation

## 2016-11-25 DIAGNOSIS — Z8249 Family history of ischemic heart disease and other diseases of the circulatory system: Secondary | ICD-10-CM | POA: Insufficient documentation

## 2016-11-25 DIAGNOSIS — Z79899 Other long term (current) drug therapy: Secondary | ICD-10-CM | POA: Insufficient documentation

## 2016-11-25 DIAGNOSIS — Z9889 Other specified postprocedural states: Secondary | ICD-10-CM | POA: Insufficient documentation

## 2016-11-25 DIAGNOSIS — I2510 Atherosclerotic heart disease of native coronary artery without angina pectoris: Secondary | ICD-10-CM | POA: Insufficient documentation

## 2016-11-25 DIAGNOSIS — Z720 Tobacco use: Secondary | ICD-10-CM | POA: Insufficient documentation

## 2016-11-25 DIAGNOSIS — I130 Hypertensive heart and chronic kidney disease with heart failure and stage 1 through stage 4 chronic kidney disease, or unspecified chronic kidney disease: Secondary | ICD-10-CM | POA: Insufficient documentation

## 2016-11-25 DIAGNOSIS — I5032 Chronic diastolic (congestive) heart failure: Secondary | ICD-10-CM

## 2016-11-25 DIAGNOSIS — Z833 Family history of diabetes mellitus: Secondary | ICD-10-CM | POA: Insufficient documentation

## 2016-11-25 DIAGNOSIS — I509 Heart failure, unspecified: Secondary | ICD-10-CM | POA: Diagnosis not present

## 2016-11-25 DIAGNOSIS — I110 Hypertensive heart disease with heart failure: Secondary | ICD-10-CM | POA: Insufficient documentation

## 2016-11-25 DIAGNOSIS — N186 End stage renal disease: Secondary | ICD-10-CM | POA: Insufficient documentation

## 2016-11-25 DIAGNOSIS — Z801 Family history of malignant neoplasm of trachea, bronchus and lung: Secondary | ICD-10-CM | POA: Insufficient documentation

## 2016-11-25 DIAGNOSIS — K219 Gastro-esophageal reflux disease without esophagitis: Secondary | ICD-10-CM | POA: Diagnosis not present

## 2016-11-25 DIAGNOSIS — N185 Chronic kidney disease, stage 5: Secondary | ICD-10-CM

## 2016-11-25 DIAGNOSIS — I10 Essential (primary) hypertension: Secondary | ICD-10-CM

## 2016-11-25 DIAGNOSIS — Z955 Presence of coronary angioplasty implant and graft: Secondary | ICD-10-CM | POA: Insufficient documentation

## 2016-11-25 DIAGNOSIS — Z87891 Personal history of nicotine dependence: Secondary | ICD-10-CM | POA: Insufficient documentation

## 2016-11-25 NOTE — Progress Notes (Signed)
Patient ID: Scott [name], male    DOB: 03/06/1961, 56 y.o.   MRN: [id number]  HPI Scott [name] is a 64 yoM with PMH of HTN, CKD3, T2DM, HLD, CAD, NSTEMI s/p stents 4/17, congestive heart failure with preserved ejection fraction.  Last admitted on 11/13/16 for SOB x 3 weeks and leg swelling. He was also found to be in AKI. Initially treated with IV furosemide which was d/c along with lisinopril due to AKI. Pt also had proteinuria. Upon discharge lisinopril, furosemide and spirolactone was d/c  Echo on 11/13/16 showed EF of 55-60% similar to echo on 01/09/15 showing EF of 55-65%  Presents today for initial visit with minimal symptoms. A little swelling in legs/abdomen. SOB when walking through grocery store but can catch breath once slows down. Have been diagnosed over 1 year. Says when he eats he sweats a lot.  Past Medical History:  Diagnosis Date  . Arthritis    "left arm; right leg" (12/13/2014)  . Asthma   . CHF (congestive heart failure) ([place])   . Chronic disease anemia    [name] 12/13/2014  . Chronic kidney disease (CKD), stage IV (severe) ([place])    [name] 12/13/2014  . Coronary artery disease    [name] 12/13/2014  . Depression   . Dysrhythmia   . GERD (gastroesophageal reflux disease)   . High cholesterol    [name] 12/13/2014  . Hypertension   . Non-Q wave myocardial infarction ([place])    [name] 12/13/2014  . PVD (peripheral vascular disease) ([place])    [name] 12/13/2014  . Type II diabetes mellitus ([place])    Past Surgical History:  Procedure Laterality Date  . CARDIAC CATHETERIZATION  11/2014  . INCISION AND DRAINAGE OF WOUND Right ~ 10/2014   "4th toe foot"  . PERCUTANEOUS CORONARY STENT INTERVENTION (PCI-S) N/A 12/14/2014   Procedure: PERCUTANEOUS CORONARY STENT INTERVENTION (PCI-S);  Surgeon: Charolette Forward, MD;  Location: [facility] CATH LAB;  Service: Cardiovascular;  Laterality: N/A;  . PERCUTANEOUS CORONARY STENT INTERVENTION (PCI-S) N/A 12/18/2014   Procedure: PERCUTANEOUS CORONARY  STENT INTERVENTION (PCI-S);  Surgeon: Charolette Forward, MD;  Location: [facility] CATH LAB;  Service: Cardiovascular;  Laterality: N/A;  . TOE AMPUTATION Right 2015   4th toe   Family History  Problem Relation Age of Onset  . Cancer Mother     Lung  . Cancer Father     Lung  . Diabetes Sister   . Diabetes Brother   . CAD Brother   . Hernia Son    Social History  Substance Use Topics  . Smoking status: Former Smoker    Packs/day: 0.00    Years: 30.00    Types: Cigarettes    Start date: 08/31/1984    Quit date: 11/30/2014  . Smokeless tobacco: Never Used  . Alcohol use Yes     Comment: Rarely, twice a year.   No Known Allergies Prior to Admission medications   Medication Sig Start Date End Date Taking? Authorizing Provider  acetaminophen (TYLENOL) 80 MG chewable tablet Chew 80 mg by mouth every 6 (six) hours as needed.   Yes Historical Provider, MD  albuterol (PROVENTIL HFA;VENTOLIN HFA) 108 (90 BASE) MCG/ACT inhaler Inhale 2 puffs into the lungs every 6 (six) hours as needed for wheezing or shortness of breath. 01/10/15  Yes Gladstone Lighter, MD  amLODipine (NORVASC) 10 MG tablet Take 5 mg by mouth daily.    Yes Historical Provider, MD  aspirin EC 81 MG tablet Take 1 tablet (81 mg total)  by mouth daily. 01/10/15  Yes Gladstone Lighter, MD  atorvastatin (LIPITOR) 80 MG tablet Take 1 tablet (80 mg total) by mouth daily at 6 PM. 01/10/15  Yes Gladstone Lighter, MD  carvedilol (COREG) 25 MG tablet Take 1 tablet (25 mg total) by mouth 2 (two) times daily with a meal. 01/10/15  Yes Gladstone Lighter, MD  Cholecalciferol (VITAMIN D) 2000 units tablet Take 2,000 Units by mouth daily.   Yes Historical Provider, MD  cilostazol (PLETAL) 50 MG tablet Take 50 mg by mouth 2 (two) times daily.   Yes Historical Provider, MD  dicyclomine (BENTYL) 10 MG capsule Take 10 mg by mouth QID.   Yes Historical Provider, MD  diphenhydrAMINE (BENADRYL) 25 mg capsule Take 1-2 tablets by mouth every 6 hours as needed Patient  taking differently: 25 mg daily.  01/14/16  Yes Jami L Hagler, PA-C  exenatide (BYETTA) 5 MCG/0.02ML SOPN injection Inject 5 mcg into the skin 2 (two) times daily. 11/05/16  Yes Historical Provider, MD  ferrous sulfate 325 (65 FE) MG tablet Take 1 tablet (325 mg total) by mouth 3 (three) times daily with meals. 12/19/14  Yes Charolette Forward, MD  insulin aspart protamine- aspart (NOVOLOG MIX 70/30) (70-30) 100 UNIT/ML injection Inject 0.35 mLs (35 Units total) into the skin 2 (two) times daily with a meal. Patient taking differently: Inject 30 Units into the skin 2 (two) times daily with a meal.  01/10/15  Yes Gladstone Lighter, MD  isosorbide-hydrALAZINE (BIDIL) 20-37.5 MG per tablet Take 1 tablet by mouth 3 (three) times daily. 01/10/15  Yes Gladstone Lighter, MD  nitroGLYCERIN (NITROSTAT) 0.4 MG SL tablet Place 1 tablet (0.4 mg total) under the tongue every 5 (five) minutes x 3 doses as needed for chest pain. 12/19/14  Yes Charolette Forward, MD  omeprazole (PRILOSEC) 20 MG capsule Take 1 capsule (20 mg total) by mouth daily. 01/10/15  Yes Gladstone Lighter, MD  pioglitazone (ACTOS) 15 MG tablet Take 15 mg by mouth daily.   Yes Historical Provider, MD  prasugrel (EFFIENT) 10 MG TABS tablet Take 1 tablet (10 mg total) by mouth daily. 01/10/15  Yes Gladstone Lighter, MD  cetirizine (ZYRTEC) 10 MG tablet Take 10 mg by mouth as needed for allergies (1 tablet PRN daily.).    Historical Provider, MD  FLUoxetine (PROZAC) 40 MG capsule Take 40 mg by mouth at bedtime. Needs refill    Historical Provider, MD  gabapentin (NEURONTIN) 100 MG capsule Take 1 capsule (100 mg total) by mouth 3 (three) times daily. Patient not taking: Reported on 11/13/2016 11/21/15   Nori Riis, PA-C     Review of Systems  Constitutional: Positive for appetite change. Negative for unexpected weight change.       Since admission, hasnt felt great; has stomach problems as well  HENT: Positive for congestion and postnasal drip.   Eyes:  Negative.   Respiratory: Positive for shortness of breath. Negative for chest tightness.   Cardiovascular: Positive for leg swelling. Negative for chest pain and palpitations.  Gastrointestinal: Positive for diarrhea and nausea. Negative for vomiting.  Endocrine: Negative.   Genitourinary: Negative.   Musculoskeletal: Negative for back pain and neck pain.  Skin: Negative.   Allergic/Immunologic: Negative.   Neurological: Positive for light-headedness. Negative for weakness and headaches.  Hematological: Bruises/bleeds easily.       On Prasugrel   Psychiatric/Behavioral: Positive for sleep disturbance. Negative for dysphoric mood and suicidal ideas. The patient is not nervous/anxious.  Two pillows   Physical Exam  Vitals:   11/25/16 0841  BP: 139/68  Pulse: 71  Resp: 18  SpO2: 100%  Weight: 242 lb 2 oz (109.8 kg)  Height: 5\' 10"  (1.778 m)   Wt Readings from Last 3 Encounters:  11/25/16 242 lb 2 oz (109.8 kg)  11/20/16 238 lb 1.6 oz (108 kg)  01/14/16 240 lb (108.9 kg)   Lab Results  Component Value Date   CREATININE 5.44 (H) 11/20/2016   CREATININE 5.44 (H) 11/19/2016   CREATININE 5.50 (H) 11/18/2016   Assessment & Plan:  1. Congestive heart failure with preserved ejection fraction - NYHA class III - euvolumic - Not adding salt to foods. Is reading food labels and avoiding canned foods. Encouraged a < 2000 mg sodium diet.  - weighing daily. Call for overnight weight gain of >2 pounds or weekly weight gain of >5 pounds - Drinks 2 bottles of water a day and 1 diet soda a day. Sometimes OJ and sometimes sugar free cranberry juice. Encouraged restriction of 40-50 oz fluid/day - pt says he has been on cilostazol for over a year, was d/c today based on CI in HF patients, was counseled to monitor for s/sx of intermittent claudication - discussed him wanting to start exercising again  2. T2DM - checking 3x daily; fasting (~140-150) - reviewed s/sx of low blood sugars;  has had 3 in past 2 weeks but said these where while he was admitted - 3/16 A1c 7.0% - Sees PCP this week - recommended to talk to PCP about d/c pioglitazone as this may exacerbate HF symptoms of fluid overload  3. HTN - bp today 139/68 - checks bp frequently at home with arm cuff - BP in morning runs lately around 130 SBP - Light headed when bp is 'low' < 100 SBP - Sees Dr. Saralyn Pilar but unsure of next appt  4.  CKD - SCr on 3/23 was 5.44 - Lisinopril and furosemide d/c on previous admission - sees nephrologist 3/29 ([place])  5. Tobacco cessation  - quit smoking over a year ago when had NSTEMI  Colonoscopy scheduled the 4/17  Return to clinic in 1 month or sooner if needed. Patient was encouraged to bring all medications to every visit.   Darrow Bussing, PharmD Pharmacy Resident 11/25/2016 9:55 AM

## 2016-12-15 ENCOUNTER — Ambulatory Visit: Admit: 2016-12-15 | Payer: Medicare HMO | Admitting: Gastroenterology

## 2016-12-15 SURGERY — COLONOSCOPY WITH PROPOFOL
Anesthesia: General

## 2016-12-28 ENCOUNTER — Ambulatory Visit: Payer: Medicare Other | Attending: Family | Admitting: Family

## 2016-12-28 ENCOUNTER — Encounter: Payer: Self-pay | Admitting: Family

## 2016-12-28 VITALS — BP 161/95 | HR 63 | Resp 18 | Ht 70.0 in | Wt 246.0 lb

## 2016-12-28 DIAGNOSIS — E1151 Type 2 diabetes mellitus with diabetic peripheral angiopathy without gangrene: Secondary | ICD-10-CM | POA: Diagnosis not present

## 2016-12-28 DIAGNOSIS — Z87891 Personal history of nicotine dependence: Secondary | ICD-10-CM | POA: Diagnosis not present

## 2016-12-28 DIAGNOSIS — I252 Old myocardial infarction: Secondary | ICD-10-CM | POA: Diagnosis not present

## 2016-12-28 DIAGNOSIS — E7800 Pure hypercholesterolemia, unspecified: Secondary | ICD-10-CM | POA: Insufficient documentation

## 2016-12-28 DIAGNOSIS — J45909 Unspecified asthma, uncomplicated: Secondary | ICD-10-CM | POA: Diagnosis not present

## 2016-12-28 DIAGNOSIS — Z801 Family history of malignant neoplasm of trachea, bronchus and lung: Secondary | ICD-10-CM | POA: Diagnosis not present

## 2016-12-28 DIAGNOSIS — I130 Hypertensive heart and chronic kidney disease with heart failure and stage 1 through stage 4 chronic kidney disease, or unspecified chronic kidney disease: Secondary | ICD-10-CM | POA: Insufficient documentation

## 2016-12-28 DIAGNOSIS — Z833 Family history of diabetes mellitus: Secondary | ICD-10-CM | POA: Diagnosis not present

## 2016-12-28 DIAGNOSIS — Z794 Long term (current) use of insulin: Secondary | ICD-10-CM | POA: Diagnosis not present

## 2016-12-28 DIAGNOSIS — N183 Chronic kidney disease, stage 3 (moderate): Secondary | ICD-10-CM | POA: Insufficient documentation

## 2016-12-28 DIAGNOSIS — I5032 Chronic diastolic (congestive) heart failure: Secondary | ICD-10-CM | POA: Insufficient documentation

## 2016-12-28 DIAGNOSIS — M1990 Unspecified osteoarthritis, unspecified site: Secondary | ICD-10-CM | POA: Diagnosis not present

## 2016-12-28 DIAGNOSIS — I10 Essential (primary) hypertension: Secondary | ICD-10-CM

## 2016-12-28 DIAGNOSIS — Z8249 Family history of ischemic heart disease and other diseases of the circulatory system: Secondary | ICD-10-CM | POA: Diagnosis not present

## 2016-12-28 DIAGNOSIS — Z955 Presence of coronary angioplasty implant and graft: Secondary | ICD-10-CM | POA: Diagnosis not present

## 2016-12-28 DIAGNOSIS — K219 Gastro-esophageal reflux disease without esophagitis: Secondary | ICD-10-CM | POA: Insufficient documentation

## 2016-12-28 DIAGNOSIS — I2510 Atherosclerotic heart disease of native coronary artery without angina pectoris: Secondary | ICD-10-CM | POA: Insufficient documentation

## 2016-12-28 DIAGNOSIS — E1122 Type 2 diabetes mellitus with diabetic chronic kidney disease: Secondary | ICD-10-CM | POA: Diagnosis not present

## 2016-12-28 DIAGNOSIS — F329 Major depressive disorder, single episode, unspecified: Secondary | ICD-10-CM | POA: Insufficient documentation

## 2016-12-28 DIAGNOSIS — Z7982 Long term (current) use of aspirin: Secondary | ICD-10-CM | POA: Diagnosis not present

## 2016-12-28 DIAGNOSIS — Z79899 Other long term (current) drug therapy: Secondary | ICD-10-CM | POA: Diagnosis not present

## 2016-12-28 DIAGNOSIS — N185 Chronic kidney disease, stage 5: Secondary | ICD-10-CM

## 2016-12-28 NOTE — Progress Notes (Signed)
Patient ID: Scott [name], male    DOB: 1961-01-30, 56 y.o.   MRN: [id number]  HPI  Scott [name] is a 9 yoM with PMH of HTN, CKD3, T2DM, HLD, CAD, NSTEMI s/p stents 4/17, congestive heart failure with preserved ejection fraction.  Last admitted on 11/13/16 for SOB x 3 weeks and leg swelling. He was also found to be in AKI. Initially treated with IV furosemide which was d/c along with lisinopril due to AKI. Pt also had proteinuria. Upon discharge lisinopril, furosemide and spirolactone was d/c  Echo on 11/13/16 showed EF of 55-60% similar to echo on 01/09/15 showing EF of 55-65%  Presents today for a follow-up visit with a chief complaint of chronic shortness of breath. He describes it as mild in nature and occurs upon moderate exertion. Has been present for several years. Has associated fatigue, congestion and slight weight gain along with this.   Past Medical History:  Diagnosis Date  . Arthritis    "left arm; right leg" (12/13/2014)  . Asthma   . CHF (congestive heart failure) ([place])   . Chronic disease anemia    [name] 12/13/2014  . Chronic kidney disease (CKD), stage IV (severe) ([place])    [name] 12/13/2014  . Coronary artery disease    [name] 12/13/2014  . Depression   . Dysrhythmia   . GERD (gastroesophageal reflux disease)   . High cholesterol    [name] 12/13/2014  . Hypertension   . Non-Q wave myocardial infarction ([place])    [name] 12/13/2014  . PVD (peripheral vascular disease) ([place])    [name] 12/13/2014  . Type II diabetes mellitus ([place])    Past Surgical History:  Procedure Laterality Date  . CARDIAC CATHETERIZATION  11/2014  . INCISION AND DRAINAGE OF WOUND Right ~ 10/2014   "4th toe foot"  . PERCUTANEOUS CORONARY STENT INTERVENTION (PCI-S) N/A 12/14/2014   Procedure: PERCUTANEOUS CORONARY STENT INTERVENTION (PCI-S);  Surgeon: Charolette Forward, MD;  Location: [facility] CATH LAB;  Service: Cardiovascular;  Laterality: N/A;  . PERCUTANEOUS CORONARY STENT INTERVENTION (PCI-S) N/A 12/18/2014    Procedure: PERCUTANEOUS CORONARY STENT INTERVENTION (PCI-S);  Surgeon: Charolette Forward, MD;  Location: [facility] CATH LAB;  Service: Cardiovascular;  Laterality: N/A;  . TOE AMPUTATION Right 2015   4th toe   Family History  Problem Relation Age of Onset  . Cancer Mother     Lung  . Cancer Father     Lung  . Diabetes Sister   . Diabetes Brother   . CAD Brother   . Hernia Son    Social History  Substance Use Topics  . Smoking status: Former Smoker    Packs/day: 0.00    Years: 30.00    Types: Cigarettes    Start date: 08/31/1984    Quit date: 11/30/2014  . Smokeless tobacco: Never Used  . Alcohol use Yes     Comment: Rarely, twice a year.   No Known Allergies Prior to Admission medications   Medication Sig Start Date End Date Taking? Authorizing Provider  acetaminophen (TYLENOL) 80 MG chewable tablet Chew 80 mg by mouth every 6 (six) hours as needed.   Yes Historical Provider, MD  albuterol (PROVENTIL HFA;VENTOLIN HFA) 108 (90 BASE) MCG/ACT inhaler Inhale 2 puffs into the lungs every 6 (six) hours as needed for wheezing or shortness of breath. 01/10/15  Yes Gladstone Lighter, MD  aspirin EC 81 MG tablet Take 1 tablet (81 mg total) by mouth daily. 01/10/15  Yes Gladstone Lighter, MD  atorvastatin (LIPITOR) 80  MG tablet Take 1 tablet (80 mg total) by mouth daily at 6 PM. 01/10/15  Yes Gladstone Lighter, MD  carvedilol (COREG) 25 MG tablet Take 1 tablet (25 mg total) by mouth 2 (two) times daily with a meal. 01/10/15  Yes Gladstone Lighter, MD  cetirizine (ZYRTEC) 10 MG tablet Take 10 mg by mouth daily.   Yes Historical Provider, MD  Cholecalciferol (VITAMIN D) 2000 units tablet Take 2,000 Units by mouth daily.   Yes Historical Provider, MD  diphenhydrAMINE (BENADRYL) 25 mg capsule Take 1-2 tablets by mouth every 6 hours as needed Patient taking differently: 25 mg daily.  01/14/16  Yes Jami L Hagler, PA-C  exenatide (BYETTA) 5 MCG/0.02ML SOPN injection Inject 5 mcg into the skin 2 (two) times  daily. 11/05/16  Yes Historical Provider, MD  hydrochlorothiazide (MICROZIDE) 12.5 MG capsule Take 12.5 mg by mouth daily.   Yes Historical Provider, MD  insulin aspart protamine- aspart (NOVOLOG MIX 70/30) (70-30) 100 UNIT/ML injection Inject 0.35 mLs (35 Units total) into the skin 2 (two) times daily with a meal. Patient taking differently: Inject 30 Units into the skin 2 (two) times daily with a meal.  01/10/15  Yes Gladstone Lighter, MD  isosorbide-hydrALAZINE (BIDIL) 20-37.5 MG per tablet Take 1 tablet by mouth 3 (three) times daily. 01/10/15  Yes Gladstone Lighter, MD  nitroGLYCERIN (NITROSTAT) 0.4 MG SL tablet Place 1 tablet (0.4 mg total) under the tongue every 5 (five) minutes x 3 doses as needed for chest pain. 12/19/14  Yes Charolette Forward, MD  omeprazole (PRILOSEC) 20 MG capsule Take 1 capsule (20 mg total) by mouth daily. 01/10/15  Yes Gladstone Lighter, MD  pioglitazone (ACTOS) 15 MG tablet Take 15 mg by mouth daily.   Yes Historical Provider, MD  prasugrel (EFFIENT) 10 MG TABS tablet Take 1 tablet (10 mg total) by mouth daily. 01/10/15  Yes Gladstone Lighter, MD    Review of Systems  Constitutional: Positive for fatigue. Negative for appetite change.  HENT: Positive for congestion and rhinorrhea. Negative for sore throat.   Eyes: Positive for visual disturbance. Negative for pain.  Respiratory: Positive for shortness of breath. Negative for cough and chest tightness.   Cardiovascular: Negative for chest pain, palpitations and leg swelling.  Gastrointestinal: Negative for abdominal distention and abdominal pain.  Endocrine: Negative.   Genitourinary: Negative.   Musculoskeletal: Negative for back pain and neck pain.  Skin: Negative.   Allergic/Immunologic: Negative.   Neurological: Negative for dizziness and light-headedness.  Hematological: Negative for adenopathy. Does not bruise/bleed easily.  Psychiatric/Behavioral: Negative for dysphoric mood and sleep disturbance (sleeping on 2  pillows). The patient is not nervous/anxious.    Vitals:   12/28/16 0929  BP: (!) 161/95  Pulse: 63  Resp: 18  SpO2: 100%  Weight: 246 lb (111.6 kg)  Height: 5\' 10"  (1.778 m)   Wt Readings from Last 3 Encounters:  12/28/16 246 lb (111.6 kg)  11/25/16 242 lb 2 oz (109.8 kg)  11/20/16 238 lb 1.6 oz (108 kg)   Lab Results  Component Value Date   CREATININE 5.44 (H) 11/20/2016   CREATININE 5.44 (H) 11/19/2016   CREATININE 5.50 (H) 11/18/2016    Physical Exam  Constitutional: He is oriented to person, place, and time. He appears well-developed and well-nourished.  HENT:  Head: Normocephalic and atraumatic.  Neck: Normal range of motion. Neck supple. No JVD present.  Cardiovascular: Regular rhythm.  Bradycardia present.   Pulmonary/Chest: Effort normal. He has no wheezes. He has no rales.  Abdominal: Soft. He exhibits no distension. There is no tenderness.  Musculoskeletal: He exhibits no edema or tenderness.  Neurological: He is alert and oriented to person, place, and time.  Skin: Skin is warm and dry.  Psychiatric: He has a normal mood and affect. His behavior is normal. Thought content normal.  Vitals reviewed.   Assessment & Plan:  1. Congestive heart failure with preserved ejection fraction - NYHA class II - euvolumic - Not adding salt to foods. Is reading food labels and avoiding canned foods. Encouraged a < 2000 mg sodium diet.  - weighing daily. Call for overnight weight gain of >2 pounds or weekly weight gain of >5 pounds - no claudication since cilostazol was stopped at his last visit - admits to not getting much exercise but is wanting to start walking more now that it's nicer outside - saw cardiologist ([place]) 10/21/16  2. T2DM - checking 3x daily; fasting this morning was 176 - reviewed s/sx of low blood sugars - 11/13/16 A1c 7.0% - he talked with his PCP about d/c pioglitazone as this may exacerbate HF symptoms of fluid overload and PCP wanted him to  continue taking it  3. HTN - bp a little elevated today - checks bp frequently at home with arm cuff   4.  CKD - SCr on 11/20/16 was 5.44 - saw nephrologist 11/26/16 [facility]) and returns to him May 2018  Medication bottles were reviewed.   Return to clinic in 3 months or sooner if needed.

## 2016-12-28 NOTE — Patient Instructions (Signed)
Continue weighing daily and call for an overnight weight gain of > 2 pounds or a weekly weight gain of >5 pounds. 

## 2017-01-17 ENCOUNTER — Inpatient Hospital Stay
Admission: EM | Admit: 2017-01-17 | Discharge: 2017-01-20 | DRG: 280 | Disposition: A | Discharge status: Home / Self Care | Payer: Medicare Other | Attending: Internal Medicine | Admitting: Internal Medicine

## 2017-01-17 ENCOUNTER — Emergency Department: Payer: Medicare Other

## 2017-01-17 DIAGNOSIS — N186 End stage renal disease: Secondary | ICD-10-CM | POA: Diagnosis present

## 2017-01-17 DIAGNOSIS — I2510 Atherosclerotic heart disease of native coronary artery without angina pectoris: Secondary | ICD-10-CM | POA: Diagnosis present

## 2017-01-17 DIAGNOSIS — E1143 Type 2 diabetes mellitus with diabetic autonomic (poly)neuropathy: Secondary | ICD-10-CM | POA: Diagnosis present

## 2017-01-17 DIAGNOSIS — I5043 Acute on chronic combined systolic (congestive) and diastolic (congestive) heart failure: Secondary | ICD-10-CM | POA: Diagnosis present

## 2017-01-17 DIAGNOSIS — D631 Anemia in chronic kidney disease: Secondary | ICD-10-CM | POA: Diagnosis present

## 2017-01-17 DIAGNOSIS — I252 Old myocardial infarction: Secondary | ICD-10-CM

## 2017-01-17 DIAGNOSIS — K3184 Gastroparesis: Secondary | ICD-10-CM | POA: Diagnosis present

## 2017-01-17 DIAGNOSIS — E785 Hyperlipidemia, unspecified: Secondary | ICD-10-CM | POA: Diagnosis present

## 2017-01-17 DIAGNOSIS — Z87891 Personal history of nicotine dependence: Secondary | ICD-10-CM

## 2017-01-17 DIAGNOSIS — I132 Hypertensive heart and chronic kidney disease with heart failure and with stage 5 chronic kidney disease, or end stage renal disease: Principal | ICD-10-CM | POA: Diagnosis present

## 2017-01-17 DIAGNOSIS — E1151 Type 2 diabetes mellitus with diabetic peripheral angiopathy without gangrene: Secondary | ICD-10-CM | POA: Diagnosis present

## 2017-01-17 DIAGNOSIS — Z7902 Long term (current) use of antithrombotics/antiplatelets: Secondary | ICD-10-CM

## 2017-01-17 DIAGNOSIS — R0602 Shortness of breath: Secondary | ICD-10-CM | POA: Diagnosis not present

## 2017-01-17 DIAGNOSIS — I5023 Acute on chronic systolic (congestive) heart failure: Secondary | ICD-10-CM | POA: Diagnosis present

## 2017-01-17 DIAGNOSIS — Z794 Long term (current) use of insulin: Secondary | ICD-10-CM

## 2017-01-17 DIAGNOSIS — N179 Acute kidney failure, unspecified: Secondary | ICD-10-CM | POA: Diagnosis present

## 2017-01-17 DIAGNOSIS — I509 Heart failure, unspecified: Secondary | ICD-10-CM | POA: Diagnosis present

## 2017-01-17 DIAGNOSIS — E7800 Pure hypercholesterolemia, unspecified: Secondary | ICD-10-CM | POA: Diagnosis present

## 2017-01-17 DIAGNOSIS — E875 Hyperkalemia: Secondary | ICD-10-CM | POA: Diagnosis present

## 2017-01-17 DIAGNOSIS — Z833 Family history of diabetes mellitus: Secondary | ICD-10-CM

## 2017-01-17 DIAGNOSIS — Z955 Presence of coronary angioplasty implant and graft: Secondary | ICD-10-CM | POA: Diagnosis not present

## 2017-01-17 DIAGNOSIS — J9601 Acute respiratory failure with hypoxia: Secondary | ICD-10-CM | POA: Diagnosis present

## 2017-01-17 DIAGNOSIS — Z992 Dependence on renal dialysis: Secondary | ICD-10-CM

## 2017-01-17 DIAGNOSIS — N2581 Secondary hyperparathyroidism of renal origin: Secondary | ICD-10-CM | POA: Diagnosis present

## 2017-01-17 DIAGNOSIS — Z8249 Family history of ischemic heart disease and other diseases of the circulatory system: Secondary | ICD-10-CM

## 2017-01-17 DIAGNOSIS — Z7982 Long term (current) use of aspirin: Secondary | ICD-10-CM | POA: Diagnosis not present

## 2017-01-17 DIAGNOSIS — I214 Non-ST elevation (NSTEMI) myocardial infarction: Secondary | ICD-10-CM | POA: Diagnosis present

## 2017-01-17 DIAGNOSIS — N185 Chronic kidney disease, stage 5: Secondary | ICD-10-CM | POA: Diagnosis not present

## 2017-01-17 DIAGNOSIS — Z825 Family history of asthma and other chronic lower respiratory diseases: Secondary | ICD-10-CM

## 2017-01-17 DIAGNOSIS — E11319 Type 2 diabetes mellitus with unspecified diabetic retinopathy without macular edema: Secondary | ICD-10-CM | POA: Diagnosis present

## 2017-01-17 DIAGNOSIS — I248 Other forms of acute ischemic heart disease: Secondary | ICD-10-CM | POA: Diagnosis present

## 2017-01-17 DIAGNOSIS — J45909 Unspecified asthma, uncomplicated: Secondary | ICD-10-CM | POA: Diagnosis present

## 2017-01-17 DIAGNOSIS — Z79899 Other long term (current) drug therapy: Secondary | ICD-10-CM | POA: Diagnosis not present

## 2017-01-17 DIAGNOSIS — E1122 Type 2 diabetes mellitus with diabetic chronic kidney disease: Secondary | ICD-10-CM | POA: Diagnosis present

## 2017-01-17 DIAGNOSIS — K219 Gastro-esophageal reflux disease without esophagitis: Secondary | ICD-10-CM | POA: Diagnosis present

## 2017-01-17 DIAGNOSIS — I10 Essential (primary) hypertension: Secondary | ICD-10-CM | POA: Diagnosis not present

## 2017-01-17 LAB — CBC
HCT: 30.1 % — ABNORMAL LOW (ref 40.0–52.0)
Hemoglobin: 10.1 g/dL — ABNORMAL LOW (ref 13.0–18.0)
MCH: 30.4 pg (ref 26.0–34.0)
MCHC: 33.4 g/dL (ref 32.0–36.0)
MCV: 91.1 fL (ref 80.0–100.0)
PLATELETS: 315 10*3/uL (ref 150–440)
RBC: 3.31 MIL/uL — AB (ref 4.40–5.90)
RDW: 15.4 % — AB (ref 11.5–14.5)
WBC: 14.4 10*3/uL — AB (ref 3.8–10.6)

## 2017-01-17 LAB — COMPREHENSIVE METABOLIC PANEL
ALBUMIN: 2.9 g/dL — AB (ref 3.5–5.0)
ALT: 19 U/L (ref 17–63)
ANION GAP: 6 (ref 5–15)
AST: 23 U/L (ref 15–41)
Alkaline Phosphatase: 99 U/L (ref 38–126)
BILIRUBIN TOTAL: 0.4 mg/dL (ref 0.3–1.2)
BUN: 41 mg/dL — ABNORMAL HIGH (ref 6–20)
CHLORIDE: 116 mmol/L — AB (ref 101–111)
CO2: 21 mmol/L — ABNORMAL LOW (ref 22–32)
Calcium: 7.3 mg/dL — ABNORMAL LOW (ref 8.9–10.3)
Creatinine, Ser: 5.75 mg/dL — ABNORMAL HIGH (ref 0.61–1.24)
GFR calc Af Amer: 11 mL/min — ABNORMAL LOW (ref >60)
GFR calc non Af Amer: 10 mL/min — ABNORMAL LOW (ref >60)
GLUCOSE: 97 mg/dL (ref 65–99)
POTASSIUM: 4.6 mmol/L (ref 3.5–5.1)
Sodium: 143 mmol/L (ref 135–145)
Total Protein: 6.4 g/dL — ABNORMAL LOW (ref 6.5–8.1)

## 2017-01-17 LAB — GLUCOSE, CAPILLARY
Glucose-Capillary: 105 mg/dL — ABNORMAL HIGH (ref 65–99)
Glucose-Capillary: 97 mg/dL (ref 65–99)
Glucose-Capillary: 103 mg/dL — ABNORMAL HIGH (ref 65–99)
Glucose-Capillary: 100 mg/dL — ABNORMAL HIGH (ref 65–99)
Glucose-Capillary: 134 mg/dL — ABNORMAL HIGH (ref 65–99)

## 2017-01-17 LAB — TSH: TSH: 4.514 u[IU]/mL — ABNORMAL HIGH (ref 0.350–4.500)

## 2017-01-17 LAB — TROPONIN I
TROPONIN I: 0.03 ng/mL — AB (ref <0.03)
Troponin I: 0.87 ng/mL (ref <0.03)
Troponin I: 1.46 ng/mL (ref <0.03)
Troponin I: 1.2 ng/mL (ref <0.03)

## 2017-01-17 LAB — MRSA PCR SCREENING: MRSA by PCR: NEGATIVE

## 2017-01-17 LAB — BRAIN NATRIURETIC PEPTIDE: B Natriuretic Peptide: 205 pg/mL — ABNORMAL HIGH (ref 0.0–100.0)

## 2017-01-17 MED ORDER — INSULIN GLARGINE 100 UNIT/ML ~~LOC~~ SOLN
42.0000 [IU] | Freq: Every day | SUBCUTANEOUS | Status: DC
  Administered 2017-01-17 – 2017-01-19 (×2): 42 [IU] via SUBCUTANEOUS
  Filled 2017-01-17 (×5): qty 0.42

## 2017-01-17 MED ORDER — ONDANSETRON HCL 4 MG/2ML IJ SOLN
4.0000 mg | Freq: Once | INTRAMUSCULAR | Status: AC
  Administered 2017-01-17: 4 mg via INTRAVENOUS

## 2017-01-17 MED ORDER — FUROSEMIDE 10 MG/ML IJ SOLN
40.0000 mg | Freq: Once | INTRAMUSCULAR | Status: AC
  Administered 2017-01-17: 40 mg via INTRAVENOUS

## 2017-01-17 MED ORDER — ALUM & MAG HYDROXIDE-SIMETH 200-200-20 MG/5ML PO SUSP
30.0000 mL | Freq: Four times a day (QID) | ORAL | Status: DC | PRN
Start: 2017-01-17 — End: 2017-01-20
  Administered 2017-01-17: 30 mL via ORAL
  Filled 2017-01-17: qty 30

## 2017-01-17 MED ORDER — ACETAMINOPHEN 650 MG RE SUPP: 650.0000 mg | Freq: Four times a day (QID) | RECTAL | Status: DC | PRN

## 2017-01-17 MED ORDER — ASPIRIN EC 81 MG PO TBEC
81.0000 mg | DELAYED_RELEASE_TABLET | Freq: Every day | ORAL | Status: DC
  Administered 2017-01-17 – 2017-01-19 (×3): 81 mg via ORAL
  Filled 2017-01-17 (×3): qty 1

## 2017-01-17 MED ORDER — HYDROCHLOROTHIAZIDE 12.5 MG PO CAPS: 12.5000 mg | ORAL_CAPSULE | Freq: Every day | ORAL | Status: DC

## 2017-01-17 MED ORDER — LABETALOL HCL 5 MG/ML IV SOLN
10.0000 mg | INTRAVENOUS | Status: DC | PRN
  Administered 2017-01-17: 10 mg via INTRAVENOUS
  Filled 2017-01-17 (×2): qty 4

## 2017-01-17 MED ORDER — ALBUTEROL SULFATE (2.5 MG/3ML) 0.083% IN NEBU: 2.5000 mg | INHALATION_SOLUTION | RESPIRATORY_TRACT | Status: DC | PRN

## 2017-01-17 MED ORDER — FUROSEMIDE 10 MG/ML IJ SOLN
80.0000 mg | Freq: Once | INTRAMUSCULAR | Status: AC
Start: 2017-01-17 — End: 2017-01-17
  Administered 2017-01-17: 80 mg via INTRAVENOUS
  Filled 2017-01-17: qty 8

## 2017-01-17 MED ORDER — CARVEDILOL 25 MG PO TABS
25.0000 mg | ORAL_TABLET | Freq: Two times a day (BID) | ORAL | Status: DC
  Administered 2017-01-18 – 2017-01-19 (×3): 25 mg via ORAL
  Filled 2017-01-17 (×3): qty 1

## 2017-01-17 MED ORDER — HEPARIN SODIUM (PORCINE) 5000 UNIT/ML IJ SOLN
5000.0000 [IU] | Freq: Three times a day (TID) | INTRAMUSCULAR | Status: DC
  Administered 2017-01-17 – 2017-01-20 (×10): 5000 [IU] via SUBCUTANEOUS
  Filled 2017-01-17 (×11): qty 1

## 2017-01-17 MED ORDER — ATORVASTATIN CALCIUM 20 MG PO TABS
80.0000 mg | ORAL_TABLET | Freq: Every day | ORAL | Status: DC
  Administered 2017-01-17 – 2017-01-19 (×2): 80 mg via ORAL
  Filled 2017-01-17 (×2): qty 4

## 2017-01-17 MED ORDER — ONDANSETRON HCL 4 MG/2ML IJ SOLN: 4.0000 mg | Freq: Four times a day (QID) | INTRAMUSCULAR | Status: DC | PRN

## 2017-01-17 MED ORDER — ONDANSETRON HCL 4 MG PO TABS: 4.0000 mg | ORAL_TABLET | Freq: Four times a day (QID) | ORAL | Status: DC | PRN

## 2017-01-17 MED ORDER — ISOSORB DINITRATE-HYDRALAZINE 20-37.5 MG PO TABS
1.0000 | ORAL_TABLET | Freq: Three times a day (TID) | ORAL | Status: DC
  Administered 2017-01-17 – 2017-01-20 (×8): 1 via ORAL
  Filled 2017-01-17 (×14): qty 1

## 2017-01-17 MED ORDER — LABETALOL HCL 5 MG/ML IV SOLN
INTRAVENOUS | Status: AC
  Filled 2017-01-17: qty 4

## 2017-01-17 MED ORDER — NITROGLYCERIN 2 % TD OINT: 0.5000 [in_us] | TOPICAL_OINTMENT | Freq: Four times a day (QID) | TRANSDERMAL | Status: DC

## 2017-01-17 MED ORDER — MORPHINE SULFATE (PF) 2 MG/ML IV SOLN
2.0000 mg | Freq: Once | INTRAVENOUS | Status: AC
  Administered 2017-01-17: 2 mg via INTRAVENOUS

## 2017-01-17 MED ORDER — INSULIN ASPART 100 UNIT/ML ~~LOC~~ SOLN
0.0000 [IU] | Freq: Three times a day (TID) | SUBCUTANEOUS | Status: DC
Start: 2017-01-17 — End: 2017-01-20
  Administered 2017-01-18 (×2): 3 [IU] via SUBCUTANEOUS
  Administered 2017-01-19: 7 [IU] via SUBCUTANEOUS
  Administered 2017-01-20: 3 [IU] via SUBCUTANEOUS
  Filled 2017-01-17 (×3): qty 3
  Filled 2017-01-17: qty 7

## 2017-01-17 MED ORDER — FUROSEMIDE 10 MG/ML IJ SOLN
40.0000 mg | Freq: Once | INTRAMUSCULAR | Status: AC
  Administered 2017-01-17: 40 mg via INTRAVENOUS
  Filled 2017-01-17: qty 4

## 2017-01-17 MED ORDER — PANTOPRAZOLE SODIUM 40 MG PO TBEC
40.0000 mg | DELAYED_RELEASE_TABLET | Freq: Every day | ORAL | Status: DC
  Administered 2017-01-17 – 2017-01-19 (×3): 40 mg via ORAL
  Filled 2017-01-17 (×3): qty 1

## 2017-01-17 MED ORDER — VITAMIN D 1000 UNITS PO TABS
2000.0000 [IU] | ORAL_TABLET | Freq: Every day | ORAL | Status: DC
  Administered 2017-01-17 – 2017-01-19 (×3): 2000 [IU] via ORAL
  Filled 2017-01-17 (×3): qty 2

## 2017-01-17 MED ORDER — PRASUGREL HCL 10 MG PO TABS
10.0000 mg | ORAL_TABLET | Freq: Every day | ORAL | Status: DC
  Administered 2017-01-17 – 2017-01-20 (×4): 10 mg via ORAL
  Filled 2017-01-17 (×4): qty 1

## 2017-01-17 MED ORDER — LABETALOL HCL 5 MG/ML IV SOLN
10.0000 mg | Freq: Once | INTRAVENOUS | Status: AC
  Administered 2017-01-17: 10 mg via INTRAVENOUS

## 2017-01-17 MED ORDER — NITROGLYCERIN 0.4 MG SL SUBL: 0.4000 mg | SUBLINGUAL_TABLET | SUBLINGUAL | Status: DC | PRN

## 2017-01-17 MED ORDER — LORATADINE 10 MG PO TABS
10.0000 mg | ORAL_TABLET | Freq: Every day | ORAL | Status: DC
  Administered 2017-01-17 – 2017-01-19 (×3): 10 mg via ORAL
  Filled 2017-01-17 (×3): qty 1

## 2017-01-17 MED ORDER — ACETAMINOPHEN 325 MG PO TABS
650.0000 mg | ORAL_TABLET | Freq: Four times a day (QID) | ORAL | Status: DC | PRN
  Administered 2017-01-19: 650 mg via ORAL
  Filled 2017-01-17: qty 2

## 2017-01-17 MED ORDER — NITROGLYCERIN 2 % TD OINT
2.0000 [in_us] | TOPICAL_OINTMENT | Freq: Once | TRANSDERMAL | Status: AC
  Administered 2017-01-17: 2 [in_us] via TOPICAL

## 2017-01-17 MED ORDER — DOCUSATE SODIUM 100 MG PO CAPS
100.0000 mg | ORAL_CAPSULE | Freq: Two times a day (BID) | ORAL | Status: DC
  Administered 2017-01-17 – 2017-01-19 (×6): 100 mg via ORAL
  Filled 2017-01-17 (×6): qty 1

## 2017-01-17 MED ORDER — ONDANSETRON HCL 4 MG/2ML IJ SOLN
INTRAMUSCULAR | Status: AC
  Filled 2017-01-17: qty 2

## 2017-01-17 MED ORDER — MORPHINE SULFATE (PF) 2 MG/ML IV SOLN
INTRAVENOUS | Status: AC
  Filled 2017-01-17: qty 1

## 2017-01-17 NOTE — Progress Notes (Signed)
Pnt admission complete. Pnt resting and no issues or concerns since admit. Pnt continues to have BiPAP in place. Lungs clear/diminshed at bases with fine crackles anterior. Pnt's son at bedside visiting and supportive. Pnt voiding clear, yellow urine and pnt denies any chest pain. Bed low and locked with call bell in reach. Pnt resting and appears comfortable. Will continue to monitor and assess.

## 2017-01-17 NOTE — Consult Note (Signed)
[facility] Consultation Note  Patient ID: Scott [name], MRN: [id number], DOB/AGE: April 07, 1961 56 y.o. Admit date: 01/17/2017   Date of Consult: 01/17/2017 Primary Physician: Inc, [facility] Primary Cardiologist: Paraschos  Chief Complaint:  Chief Complaint  Patient presents with  . Respiratory Distress   Reason for Consult: non-ST elevation myocardial infarction  HPI: 56 y.o. male with known coronary artery disease status post previous anterior myocardial infarction PCI and stent placement of left anterior descending artery obtuse marginal 2 and PDA in the last U2 years. The patient has had some worsening shortness of breath with physical activity associated with chest pressure and tightness over the last several weeks and recently had a stress test showing fixed inferior myocardial perfusion defect with some minimal peri-infarct ischemia possibly consistent with his in-stent restenosis or other coronary artery disease. The patient has been on appropriate medication management including high intensity cholesterol therapy and essential hypertension treatment with isosorbide and beta blocker as well as dual antiplatelet therapy for his issues. He had severe chest pressure last night for which she was in the hospital and had relief with nitroglycerin current medical regimen. His troponin is elevated at 1.4 consistent with acute coronary syndrome and non-ST elevation myocardial infarction with an EKG showing normal sinus rhythm and no ST elevation changes. Patient does have the chronic kidney disease stage IV essential hypertension diabetes with palpation hyperlipidemia peripheral vascular disease all well controlled on medication management. We have discussed the possibility of the treatment options although with severe chronic kidney disease would try medication management for inferior changes  Past Medical History:  Diagnosis Date  . Arthritis    "left arm; right leg"  (12/13/2014)  . Asthma   . CHF (congestive heart failure) ([place])   . Chronic disease anemia    [name] 12/13/2014  . Chronic kidney disease (CKD), stage IV (severe) ([place])    [name] 12/13/2014  . Coronary artery disease    [name] 12/13/2014  . Depression   . Dysrhythmia   . GERD (gastroesophageal reflux disease)   . High cholesterol    [name] 12/13/2014  . Hypertension   . Non-Q wave myocardial infarction ([place])    [name] 12/13/2014  . PVD (peripheral vascular disease) ([place])    [name] 12/13/2014  . Type II diabetes mellitus ([place])       Surgical History:  Past Surgical History:  Procedure Laterality Date  . CARDIAC CATHETERIZATION  11/2014  . INCISION AND DRAINAGE OF WOUND Right ~ 10/2014   "4th toe foot"  . PERCUTANEOUS CORONARY STENT INTERVENTION (PCI-S) N/A 12/14/2014   Procedure: PERCUTANEOUS CORONARY STENT INTERVENTION (PCI-S);  Surgeon: Charolette Forward, MD;  Location: [facility] CATH LAB;  Service: Cardiovascular;  Laterality: N/A;  . PERCUTANEOUS CORONARY STENT INTERVENTION (PCI-S) N/A 12/18/2014   Procedure: PERCUTANEOUS CORONARY STENT INTERVENTION (PCI-S);  Surgeon: Charolette Forward, MD;  Location: [facility] CATH LAB;  Service: Cardiovascular;  Laterality: N/A;  . TOE AMPUTATION Right 2015   4th toe     Home Meds: Prior to Admission medications   Medication Sig Start Date End Date Taking? Authorizing Provider  acetaminophen (TYLENOL) 325 MG tablet Take 325 mg by mouth every 6 (six) hours as needed for mild pain.    Yes [provider]  albuterol (PROVENTIL HFA;VENTOLIN HFA) 108 (90 BASE) MCG/ACT inhaler Inhale 2 puffs into the lungs every 6 (six) hours as needed for wheezing or shortness of breath. 01/10/15  Yes Gladstone Lighter, MD  aspirin EC 81 MG tablet Take 1  tablet (81 mg total) by mouth daily. 01/10/15  Yes Gladstone Lighter, MD  atorvastatin (LIPITOR) 80 MG tablet Take 1 tablet (80 mg total) by mouth daily at 6 PM. 01/10/15  Yes Gladstone Lighter, MD  carvedilol (COREG) 25 MG tablet  Take 1 tablet (25 mg total) by mouth 2 (two) times daily with a meal. 01/10/15  Yes Gladstone Lighter, MD  cetirizine (ZYRTEC) 10 MG tablet Take 10 mg by mouth daily.   Yes [provider]  Cholecalciferol (VITAMIN D) 2000 units tablet Take 2,000 Units by mouth daily.   Yes [provider]  diphenhydrAMINE (BENADRYL) 25 mg capsule Take 1-2 tablets by mouth every 6 hours as needed Patient taking differently: Take 25-50 mg by mouth every 6 (six) hours as needed for allergies.  01/14/16  Yes Hagler, Jami L, PA-C  exenatide (BYETTA) 5 MCG/0.02ML SOPN injection Inject 5 mcg into the skin 2 (two) times daily. 11/05/16  Yes [provider]  hydrochlorothiazide (MICROZIDE) 12.5 MG capsule Take 12.5 mg by mouth daily.   Yes [provider]  insulin aspart protamine- aspart (NOVOLOG MIX 70/30) (70-30) 100 UNIT/ML injection Inject 0.35 mLs (35 Units total) into the skin 2 (two) times daily with a meal. Patient taking differently: Inject 30 Units into the skin 2 (two) times daily with a meal.  01/10/15  Yes Gladstone Lighter, MD  isosorbide-hydrALAZINE (BIDIL) 20-37.5 MG per tablet Take 1 tablet by mouth 3 (three) times daily. 01/10/15  Yes Gladstone Lighter, MD  nitroGLYCERIN (NITROSTAT) 0.4 MG SL tablet Place 1 tablet (0.4 mg total) under the tongue every 5 (five) minutes x 3 doses as needed for chest pain. 12/19/14  Yes Charolette Forward, MD  omeprazole (PRILOSEC) 20 MG capsule Take 1 capsule (20 mg total) by mouth daily. 01/10/15  Yes Gladstone Lighter, MD  pioglitazone (ACTOS) 15 MG tablet Take 15 mg by mouth daily.   Yes [provider]  prasugrel (EFFIENT) 10 MG TABS tablet Take 1 tablet (10 mg total) by mouth daily. 01/10/15  Yes Gladstone Lighter, MD    Inpatient Medications:  . aspirin EC  81 mg Oral Daily  . atorvastatin  80 mg Oral q1800  . [START ON 01/19/2017] carvedilol  25 mg Oral BID WC  . cholecalciferol  2,000 Units Oral Daily  . docusate sodium  100  mg Oral BID  . heparin  5,000 Units Subcutaneous Q8H  . insulin aspart  0-20 Units Subcutaneous TID WC  . insulin glargine  42 Units Subcutaneous QHS  . isosorbide-hydrALAZINE  1 tablet Oral TID  . loratadine  10 mg Oral Daily  . pantoprazole  40 mg Oral Daily  . prasugrel  10 mg Oral Daily     Allergies: No Known Allergies  Social History   Social History  . Marital status: Divorced    Spouse name: N/A  . Number of children: N/A  . Years of education: N/A   Occupational History  . Not on file.   Social History Main Topics  . Smoking status: Former Smoker    Packs/day: 0.00    Years: 30.00    Types: Cigarettes    Start date: 08/31/1984    Quit date: 11/30/2014  . Smokeless tobacco: Never Used  . Alcohol use Yes     Comment: Rarely, twice a year.  . Drug use: No  . Sexual activity: Not Currently   Other Topics Concern  . Not on file   Social History Narrative  . No narrative on file  Family History  Problem Relation Age of Onset  . Cancer Mother        Lung  . Cancer Father        Lung  . Diabetes Sister   . Diabetes Brother   . CAD Brother   . Hernia Son   . Cancer Brother      Review of Systems Positive for Shortness of breath chest pain weakness fatigue Negative for: General:  chills, fever, night sweats or weight changes.  Cardiovascular: PND orthopnea syncope dizziness  Dermatological skin lesions rashes Respiratory: Cough congestion Urologic: Frequent urination urination at night and hematuria Abdominal: negative for nausea, vomiting, diarrhea, bright red blood per rectum, melena, or hematemesis Neurologic: negative for visual changes, and/or hearing changes  All other systems reviewed and are otherwise negative except as noted above.  Labs:  Recent Labs  01/17/17 0050 01/17/17 0732 01/17/17 1331  TROPONINI 0.03* 0.87* 1.46*   Lab Results  Component Value Date   WBC 14.4 (H) 01/17/2017   HGB 10.1 (L) 01/17/2017   HCT 30.1 (L)  01/17/2017   MCV 91.1 01/17/2017   PLT 315 01/17/2017    Recent Labs Lab 01/17/17 0050  NA 143  K 4.6  CL 116*  CO2 21*  BUN 41*  CREATININE 5.75*  CALCIUM 7.3*  PROT 6.4*  BILITOT 0.4  ALKPHOS 99  ALT 19  AST 23  GLUCOSE 97   Lab Results  Component Value Date   CHOL 100 12/14/2014   HDL 37 (L) 12/14/2014   LDLCALC 43 12/14/2014   TRIG 98 12/14/2014   No results found for: DDIMER  Radiology/Studies:  Dg Chest Portable 1 View  Result Date: 01/17/2017 CLINICAL DATA:  Acute onset of respiratory distress EXAM: PORTABLE CHEST 1 VIEW COMPARISON:  11/13/2016 FINDINGS: Stable cardiomegaly. No aortic aneurysm. Pulmonary venous congestion is stable. No pneumonic consolidation, effusion or pneumothorax. No acute nor suspicious osseous abnormalities. IMPRESSION: Stable cardiomegaly with mild pulmonary vascular congestion. Electronically Signed   By: Ashley Royalty M.D.   On: 01/17/2017 01:28    EKG: Normal sinus rhythm  Weights: Filed Weights   01/17/17 0057 01/17/17 0448  Weight: 111.6 kg (246 lb) 111.5 kg (245 lb 13 oz)     Physical Exam: Blood pressure (!) 160/83, pulse 73, temperature 98.3 F (36.8 C), resp. rate 18, height 5\' 10"  (1.778 m), weight 111.5 kg (245 lb 13 oz), SpO2 97 %. Body mass index is 35.27 kg/m. General: Well developed, well nourished, in no acute distress. Head eyes ears nose throat: Normocephalic, atraumatic, sclera non-icteric, no xanthomas, nares are without discharge. No apparent thyromegaly and/or mass  Lungs: Normal respiratory effort.  no wheezes, no rales, no rhonchi.  Heart: RRR with normal S1 S2. no murmur gallop, no rub, PMI is normal size and placement, carotid upstroke normal without bruit, jugular venous pressure is normal Abdomen: Soft, non-tender, non-distended with normoactive bowel sounds. No hepatomegaly. No rebound/guarding. No obvious abdominal masses. Abdominal aorta is normal size without bruit Extremities: No edema. no cyanosis,  no clubbing, no ulcers  Peripheral : 2+ bilateral upper extremity pulses, 2+ bilateral femoral pulses, 2+ bilateral dorsal pedal pulse Neuro: Alert and oriented. No facial asymmetry. No focal deficit. Moves all extremities spontaneously. Musculoskeletal: Normal muscle tone without kyphosis Psych:  Responds to questions appropriately with a normal affect.    Assessment: 56 year old male with known coronary artery disease as per previous myocardial infarction with non-ST elevation myocardial infarction and recent stress test with inferior ischemia and fixed  inferior defect consistent with further changes and inferior infarct with chronic kidney disease disease stage IV and need for medication management  Plan: 1. Continue dual antiplatelet medication management for myocardial infarction 2. Heparin for 48 hours for further risk reduction of myocardial infarction 3. Continue isosorbide for improvements of collateral blood flow possibly to the inferior wall 4. High intensity cholesterol therapy with atorvastatin without change X 5. Continue beta blocker and abstain from ACE inhibitor due to chronic kidney disease for myocardial infarction 6. Begin ambulation and follow for improvements of symptoms and all further intervention only if absolutely necessary due to concerns of chronic kidney disease  Signed, Corey Skains M.D. [facility] 01/17/2017, 7:35 PM

## 2017-01-17 NOTE — ED Notes (Addendum)
ED Provider at bedside. 

## 2017-01-17 NOTE — Progress Notes (Signed)
Pain noted    0/10.  Made comfortable in the bed.  Pt requesting something for the heartburns.  Dr Romero Belling called and made aware.

## 2017-01-17 NOTE — Progress Notes (Signed)
[facility] NAME: Scott [name]    MR#:  [id number]  DATE OF BIRTH:  1961/01/07  SUBJECTIVE:  CHIEF COMPLAINT:   Chief Complaint  Patient presents with  . Respiratory Distress   -Admitted with difficulty breathing, noted to have CHF exacerbation. -Currently breathing is much improved. This morning when examined, remains on BiPAP  REVIEW OF SYSTEMS:  Review of Systems  Constitutional: Negative for chills, fever and malaise/fatigue.  HENT: Negative for congestion, ear discharge, hearing loss and nosebleeds.   Eyes: Negative for blurred vision and double vision.  Respiratory: Positive for shortness of breath. Negative for cough and wheezing.   Cardiovascular: Negative for chest pain, palpitations and leg swelling.  Gastrointestinal: Negative for abdominal pain, constipation, diarrhea, nausea and vomiting.  Genitourinary: Negative for dysuria.  Musculoskeletal: Negative for myalgias.  Neurological: Negative for dizziness, speech change, focal weakness, seizures and headaches.  Psychiatric/Behavioral: Negative for depression and suicidal ideas.    DRUG ALLERGIES:  No Known Allergies  VITALS:  Blood pressure (!) 167/97, pulse 76, temperature 97.7 F (36.5 C), temperature source Axillary, resp. rate 14, height 5\' 10"  (1.778 m), weight 111.5 kg (245 lb 13 oz), SpO2 98 %.  PHYSICAL EXAMINATION:  Physical Exam  GENERAL:  56 y.o.-year-old  Overweight patient lying in the bed with no acute distress.  EYES: Pupils equal, round, reactive to light and accommodation. No scleral icterus. Extraocular muscles intact.  HEENT: Head atraumatic, normocephalic. Oropharynx and nasopharynx clear.  NECK:  Supple, no jugular venous distention. No thyroid enlargement, no tenderness.  LUNGS: Normal breath sounds bilaterally, no wheezing, rales,rhonchi or crepitation. No use of accessory muscles of respiration. Decreased bibasilar breath  sounds CARDIOVASCULAR: S1, S2 normal. No murmurs, rubs, or gallops.  ABDOMEN: Soft, nontender, nondistended. Bowel sounds present. No organomegaly or mass.  EXTREMITIES: No pedal edema, cyanosis, or clubbing.  NEUROLOGIC: Cranial nerves II through XII are intact. Muscle strength 5/5 in all extremities. Sensation intact. Gait not checked.  PSYCHIATRIC: The patient is alert and oriented x 3.  SKIN: No obvious rash, lesion, or ulcer.    LABORATORY PANEL:   CBC  Recent Labs Lab 01/17/17 0050  WBC 14.4*  HGB 10.1*  HCT 30.1*  PLT 315   ------------------------------------------------------------------------------------------------------------------  Chemistries   Recent Labs Lab 01/17/17 0050  NA 143  K 4.6  CL 116*  CO2 21*  GLUCOSE 97  BUN 41*  CREATININE 5.75*  CALCIUM 7.3*  AST 23  ALT 19  ALKPHOS 99  BILITOT 0.4   ------------------------------------------------------------------------------------------------------------------  Cardiac Enzymes  Recent Labs Lab 01/17/17 0732  TROPONINI 0.87*   ------------------------------------------------------------------------------------------------------------------  RADIOLOGY:  Dg Chest Portable 1 View  Result Date: 01/17/2017 CLINICAL DATA:  Acute onset of respiratory distress EXAM: PORTABLE CHEST 1 VIEW COMPARISON:  11/13/2016 FINDINGS: Stable cardiomegaly. No aortic aneurysm. Pulmonary venous congestion is stable. No pneumonic consolidation, effusion or pneumothorax. No acute nor suspicious osseous abnormalities. IMPRESSION: Stable cardiomegaly with mild pulmonary vascular congestion. Electronically Signed   By: Ashley Royalty M.D.   On: 01/17/2017 01:28    EKG:   Orders placed or performed during the hospital encounter of 01/17/17  . ED EKG within 10 minutes  . ED EKG within 10 minutes  . EKG 12-Lead  . EKG 12-Lead    ASSESSMENT AND PLAN:   56 year old male with past medical history significant for  diastolic congestive heart failure, CK D stage IV, CAD, depression, hyperlipidemia results to hospital secondary to difficulty breathing  and noted to have hypoxia.  #1 acute hypoxic respiratory failure-secondary to acute on chronic diastolic CHF exacerbation. -Recent echocardiogram with normal ejection fraction. Required BiPAP and admission. Much improved breathing. -wean to nasal cannula as tolerated. Patient does not use home oxygen. -Continue IV Lasix at this time.  #2 CK D stage IV-patient's previous baseline creatinine used to be around 2.7, last admission 2 months ago his creatinine worsened up to 5 -Patient did not follow-up with his [facility] nephrology as recommended. Currently creatinine is much elevated and GFR is less than 15. -Continue to monitor urine output. Recommend nephrology consultation and patient might need to be initiated on dialysis soon  #3 diabetes mellitus-currently on Lantus and sliding scale insulin.  #4 CAD-continue aspirin, Effient, Coreg, Imdur and statin  #5 DVT prophylaxis-on subcutaneous heparin     All the records are reviewed and case discussed with Care Management/Social Workerr. Management plans discussed with the patient, family and they are in agreement.  CODE STATUS: Full code  TOTAL TIME TAKING CARE OF THIS PATIENT: 38 minutes.   POSSIBLE D/C IN 2 DAYS, DEPENDING ON CLINICAL CONDITION.   Gladstone Lighter M.D on 01/17/2017 at 10:02 AM  Between 7am to 6pm - Pager - [phone]  After 6pm go to [email or web address] - password [email or web address] [facility]  Office  [phone]  CC: Primary care physician; [employer]

## 2017-01-17 NOTE — ED Notes (Signed)
Pharmacy tech at bedside 

## 2017-01-17 NOTE — H&P (Signed)
Scott [name] is an 56 y.o. male.   Chief Complaint: Shortness of breath HPI: The patient with past medical history of CHF, CAD status post myocardial infarction in 2016, diabetes and chronic kidney disease presents to the emergency department complaining of shortness of breath. The patient states that he is become more dyspneic over the last week. He also reports swelling of his legs and cough productive of frothy sputum. Chest x-ray in the emergency department showed mild pulmonary vascular congestion but rales were heard on physical exam. The patient's work of breathing also increased prompting the emergency department staff to initiate BiPAP. His blood pressure was also particularly elevated. The patient denies chest pain but does admit to left shoulder pain. Initial troponin was positive per lab normal values. The patient was given Lasix prior to the hospital service been called for admission.  Past Medical History:  Diagnosis Date  . Arthritis    "left arm; right leg" (12/13/2014)  . Asthma   . CHF (congestive heart failure) ([place])   . Chronic disease anemia    [name] 12/13/2014  . Chronic kidney disease (CKD), stage IV (severe) ([place])    [name] 12/13/2014  . Coronary artery disease    [name] 12/13/2014  . Depression   . Dysrhythmia   . GERD (gastroesophageal reflux disease)   . High cholesterol    [name] 12/13/2014  . Hypertension   . Non-Q wave myocardial infarction ([place])    [name] 12/13/2014  . PVD (peripheral vascular disease) ([place])    [name] 12/13/2014  . Type II diabetes mellitus ([place])     Past Surgical History:  Procedure Laterality Date  . CARDIAC CATHETERIZATION  11/2014  . INCISION AND DRAINAGE OF WOUND Right ~ 10/2014   "4th toe foot"  . PERCUTANEOUS CORONARY STENT INTERVENTION (PCI-S) N/A 12/14/2014   Procedure: PERCUTANEOUS CORONARY STENT INTERVENTION (PCI-S);  Surgeon: Charolette Forward, MD;  Location[facility] C[facility] LAB;  Service: Cardiovascular;  Laterality: N/A;  . PERCUTANEOUS  CORONARY STENT INTERVENTION (PCI-S) N/A 12/18/2014   Procedure: PERCUTANEOUS CORONARY STENT INTERVENTION (PCI-S);  Surgeon: Charolette Forward, MD;  [facility]: [facility] C[facility] LAB;  Service: Cardiovascular;  Laterality: N/A;  . TOE AMPUTATION Right 2015   4th toe    Family History  Problem Relation Age of Onset  . Cancer Mother        Lung  . Cancer Father        Lung  . Diabetes Sister   . Diabetes Brother   . CAD Brother   . Hernia Son   . Cancer Brother    Social History:  reports that he quit smoking about 2 years ago. His smoking use included Cigarettes. He started smoking about 32 years ago. He smoked 0.00 packs per day for 30.00 years. He has never used smokeless tobacco. He reports that he drinks alcohol. He reports that he does not use drugs.  Allergies: No Known Allergies  Medications Prior to Admission  Medication Sig Dispense Refill  . acetaminophen (TYLENOL) 325 MG tablet Take 325 mg by mouth every 6 (six) hours as needed for mild pain.     [name] albuterol (PROVENTIL HFA;VENTOLIN HFA) 108 (90 BASE) MCG/ACT inhaler Inhale 2 puffs into the lungs every 6 (six) hours as needed for wheezing or shortness of breath. 1 Inhaler 2  . aspirin EC 81 MG tablet Take 1 tablet (81 mg total) by mouth daily. 30 tablet 0  . atorvastatin (LIPITOR) 80 MG tablet Take 1 tablet (80 mg total) by mouth daily  at 6 PM. 30 tablet 3  . carvedilol (COREG) 25 MG tablet Take 1 tablet (25 mg total) by mouth 2 (two) times daily with a meal. 30 tablet 1  . cetirizine (ZYRTEC) 10 MG tablet Take 10 mg by mouth daily.    . Cholecalciferol (VITAMIN D) 2000 units tablet Take 2,000 Units by mouth daily.    . diphenhydrAMINE (BENADRYL) 25 mg capsule Take 1-2 tablets by mouth every 6 hours as needed (Patient taking differently: Take 25-50 mg by mouth every 6 (six) hours as needed for allergies. ) 30 capsule 0  . exenatide (BYETTA) 5 MCG/0.02ML SOPN injection Inject 5 mcg into the skin 2 (two) times daily.    . hydrochlorothiazide  (MICROZIDE) 12.5 MG capsule Take 12.5 mg by mouth daily.    . insulin aspart protamine- aspart (NOVOLOG MIX 70/30) (70-30) 100 UNIT/ML injection Inject 0.35 mLs (35 Units total) into the skin 2 (two) times daily with a meal. (Patient taking differently: Inject 30 Units into the skin 2 (two) times daily with a meal. ) 20 mL 0  . isosorbide-hydrALAZINE (BIDIL) 20-37.5 MG per tablet Take 1 tablet by mouth 3 (three) times daily. 90 tablet 0  . nitroGLYCERIN (NITROSTAT) 0.4 MG SL tablet Place 1 tablet (0.4 mg total) under the tongue every 5 (five) minutes x 3 doses as needed for chest pain. 25 tablet 12  . omeprazole (PRILOSEC) 20 MG capsule Take 1 capsule (20 mg total) by mouth daily. 30 capsule 0  . pioglitazone (ACTOS) 15 MG tablet Take 15 mg by mouth daily.    . prasugrel (EFFIENT) 10 MG TABS tablet Take 1 tablet (10 mg total) by mouth daily. 30 tablet 0    Results for orders placed or performed during the hospital encounter of 01/17/17 (from the past 48 hour(s))  CBC     Status: Abnormal   Collection Time: 01/17/17 12:50 AM  Result Value Ref Range   WBC 14.4 (H) 3.8 - 10.6 K/uL   RBC 3.31 (L) 4.40 - 5.90 MIL/uL   Hemoglobin 10.1 (L) 13.0 - 18.0 g/dL   HCT 30.1 (L) 40.0 - 52.0 %   MCV 91.1 80.0 - 100.0 fL   MCH 30.4 26.0 - 34.0 pg   MCHC 33.4 32.0 - 36.0 g/dL   RDW 15.4 (H) 11.5 - 14.5 %   Platelets 315 150 - 440 K/uL  Troponin I     Status: Abnormal   Collection Time: 01/17/17 12:50 AM  Result Value Ref Range   Troponin I 0.03 (HH) <0.03 ng/mL    Comment: CRITICAL RESULT CALLED TO, READ BACK BY AND VERIFIED WITH JENNA ELLINGTON AT 0144 ON 01/17/17 RWW   Comprehensive metabolic panel     Status: Abnormal   Collection Time: 01/17/17 12:50 AM  Result Value Ref Range   Sodium 143 135 - 145 mmol/L   Potassium 4.6 3.5 - 5.1 mmol/L   Chloride 116 (H) 101 - 111 mmol/L   CO2 21 (L) 22 - 32 mmol/L   Glucose, Bld 97 65 - 99 mg/dL   BUN 41 (H) 6 - 20 mg/dL   Creatinine, Ser 5.75 (H) 0.61 -  1.24 mg/dL   Calcium 7.3 (L) 8.9 - 10.3 mg/dL   Total Protein 6.4 (L) 6.5 - 8.1 g/dL   Albumin 2.9 (L) 3.5 - 5.0 g/dL   AST 23 15 - 41 U/L   ALT 19 17 - 63 U/L   Alkaline Phosphatase 99 38 - 126 U/L   Total Bilirubin  0.4 0.3 - 1.2 mg/dL   GFR calc non Af Amer 10 (L) >60 mL/min   GFR calc Af Amer 11 (L) >60 mL/min    Comment: (NOTE) The eGFR has been calculated using the CKD EPI equation. This calculation has not been validated in all clinical situations. eGFR's persistently <60 mL/min signify possible Chronic Kidney Disease.    Anion gap 6 5 - 15  Brain natriuretic peptide     Status: Abnormal   Collection Time: 01/17/17 12:50 AM  Result Value Ref Range   B Natriuretic Peptide 205.0 (H) 0.0 - 100.0 pg/mL  TSH     Status: Abnormal   Collection Time: 01/17/17 12:50 AM  Result Value Ref Range   TSH 4.514 (H) 0.350 - 4.500 uIU/mL    Comment: Performed by a 3rd Generation assay with a functional sensitivity of <=0.01 uIU/mL.  MRSA PCR Screening     Status: None   Collection Time: 01/17/17  3:56 AM  Result Value Ref Range   MRSA by PCR NEGATIVE NEGATIVE    Comment:        The GeneXpert MRSA Assay (FDA approved for NASAL specimens only), is one component of a comprehensive MRSA colonization surveillance program. It is not intended to diagnose MRSA infection nor to guide or monitor treatment for MRSA infections.   Glucose, capillary     Status: Abnormal   Collection Time: 01/17/17  4:01 AM  Result Value Ref Range   Glucose-Capillary 105 (H) 65 - 99 mg/dL   Dg Chest Portable 1 View  Result Date: 01/17/2017 CLINICAL DATA:  Acute onset of respiratory distress EXAM: PORTABLE CHEST 1 VIEW COMPARISON:  11/13/2016 FINDINGS: Stable cardiomegaly. No aortic aneurysm. Pulmonary venous congestion is stable. No pneumonic consolidation, effusion or pneumothorax. No acute nor suspicious osseous abnormalities. IMPRESSION: Stable cardiomegaly with mild pulmonary vascular congestion.  Electronically Signed   By: Ashley Royalty M.D.   On: 01/17/2017 01:28    Review of Systems  Constitutional: Negative for chills and fever.  HENT: Negative for sore throat and tinnitus.   Eyes: Negative for blurred vision and redness.  Respiratory: Negative for cough and shortness of breath.   Cardiovascular: Negative for chest pain, palpitations, orthopnea and PND.  Gastrointestinal: Negative for abdominal pain, diarrhea, nausea and vomiting.  Genitourinary: Negative for dysuria, frequency and urgency.  Musculoskeletal: Negative for joint pain and myalgias.  Skin: Negative for rash.       No lesions  Neurological: Negative for speech change, focal weakness and weakness.  Endo/Heme/Allergies: Does not bruise/bleed easily.       No temperature intolerance  Psychiatric/Behavioral: Negative for depression and suicidal ideas.    Blood pressure (!) 167/97, pulse 76, temperature 97.7 F (36.5 C), temperature source Axillary, resp. rate 14, height _0  (1.778 m), weight 111.5 kg (245 lb 13 oz), SpO2 98 %. Physical Exam  Nursing note and vitals reviewed. Constitutional: He is oriented to person, place, and time. He appears well-developed and well-nourished. No distress.  HENT:  Head: Normocephalic and atraumatic.  Mouth/Throat: Oropharynx is clear and moist.  Eyes: Conjunctivae and EOM are normal. Pupils are equal, round, and reactive to light. No scleral icterus.  Neck: Normal range of motion. Neck supple. No JVD present. No tracheal deviation present. No thyromegaly present.  Cardiovascular: Normal rate, regular rhythm and normal heart sounds.  Exam reveals no gallop and no friction rub.   No murmur heard. Respiratory: Tachypnea noted. He has rales in the right lower field and the left  lower field.  Currently on BiPAP  GI: Soft. Bowel sounds are normal. He exhibits no distension. There is no tenderness.  Genitourinary:  Genitourinary Comments: Deferred  Musculoskeletal: Normal range of  motion. He exhibits edema.  Lymphadenopathy:    He has no cervical adenopathy.  Neurological: He is alert and oriented to person, place, and time. No cranial nerve deficit.  Skin: Skin is warm and dry. No rash noted. No erythema.  Psychiatric: He has a normal mood and affect. His behavior is normal. Judgment and thought content normal.     Assessment/Plan This is a 56 year old male admitted for acute on chronic heart failure. 1. Heart failure: Acute on chronic; diastolic. EF 55-60% in March 2018. Manage blood pressure. Patient reports that dry weight is approximately 240 pounds. Goal is 5 pounds diuresis. Wean BiPAP. 2. Essential hypertension: I placed Nitropaste on the patient's chest. Hold Coreg during acute heart failure. Resume once patient is euvolemic. Continue BiDil. Hold Hydrochlorothiazide 3. Diabetes mellitus type 2: Continue basal insulin therapy. Sliding suicidal hospitalized. Hold Byetta in the setting of acute on chronic kidney disease as this may worsen kidney failure. Recheck hemoglobin A1c 4. Kidney failure: Acute on chronic; avoid nephrotoxic agents. 5. CAD: Follow biomarkers to rule out myocardial ischemia. Continue Plavix. I have added aspirin to the patient's regimen 6. Hyperlipidemia: Continue statin therapy 7. DVT prophylaxis: Heparin 8. GI prophylaxis: The The patient is a full code. Time spent on admission orders and critical care approximately 45 minutes   Harrie Foreman, MD 01/17/2017, 7:23 AM

## 2017-01-17 NOTE — ED Notes (Signed)
2" of nitropaste placed on left chest by EMS. MD Owens Shark ordered to leave paste in place.

## 2017-01-17 NOTE — ED Provider Notes (Signed)
[facility] Emergency Department Provider Note    First MD Initiated Contact with Patient 01/17/17 [phone]     (approximate)  I have reviewed the triage vital signs and the nursing notes.   HISTORY  Chief Complaint Respiratory Distress    HPI Scott [name] is a 56 y.o. male with bolus of chronic medical conditions including congestive heart failure presents via EMS. Per EMS initial call was for respiratory distress on arrival of patient's oxygen saturation was 70% on room air. CPAP was applied by EMS. Patient noted to be markedly hypertensive and a such 2 inches of Nitropaste was applied Patient presents to the emergency department continued respiratory distress. Patient states progressive dyspnea over the past day with acute worsening tonight. Patient also admits to orthopnea. Patient denies any chest pain. She denies any fever does admit to nonproductive cough. Patient also admits to bilateral lower extremity swelling.   Past Medical History:  Diagnosis Date  . Arthritis    "left arm; right leg" (12/13/2014)  . Asthma   . CHF (congestive heart failure) ([place])   . Chronic disease anemia    [name] 12/13/2014  . Chronic kidney disease (CKD), stage IV (severe) ([place])    [name] 12/13/2014  . Coronary artery disease    [name] 12/13/2014  . Depression   . Dysrhythmia   . GERD (gastroesophageal reflux disease)   . High cholesterol    [name] 12/13/2014  . Hypertension   . Non-Q wave myocardial infarction ([place])    [name] 12/13/2014  . PVD (peripheral vascular disease) ([place])    [name] 12/13/2014  . Type II diabetes mellitus [facility])     Patient Active Problem List   Diagnosis Date Noted  . Acute on chronic systolic CHF (congestive heart failure) ([place]) 01/17/2017  . CKD (chronic kidney disease), stage V ([place]) 11/25/2016  . Tobacco abuse 11/25/2016  . Acute renal failure (ARF) ([place]) 11/15/2016  . CHF (congestive heart failure) ([place]) 11/13/2016  . Diabetes ([place])  09/17/2015  . Decreased renal function 05/23/2015  . GERD (gastroesophageal reflux disease) 04/04/2015  . Hypertension 02/26/2015    Past Surgical History:  Procedure Laterality Date  . CARDIAC CATHETERIZATION  11/2014  . INCISION AND DRAINAGE OF WOUND Right ~ 10/2014   "4th toe foot"  . PERCUTANEOUS CORONARY STENT INTERVENTION (PCI-S) N/A 12/14/2014   Procedure: PERCUTANEOUS CORONARY STENT INTERVENTION (PCI-S);  Surgeon: Charolette Forward, MD;  Location[facility] CATH LAB;  Service: Cardiovascular;  Laterality: N/A;  . PERCUTANEOUS CORONARY STENT INTERVENTION (PCI-S) N/A 12/18/2014   Procedure: PERCUTANEOUS CORONARY STENT INTERVENTION (PCI-S);  Surgeon: Charolette Forward, MD;  Location: [facility] CATH LAB;  Service: Cardiovascular;  Laterality: N/A;  . TOE AMPUTATION Right 2015   4th toe    Prior to Admission medications   Medication Sig Start Date End Date Taking? Authorizing Provider  acetaminophen (TYLENOL) 325 MG tablet Take 325 mg by mouth every 6 (six) hours as needed for mild pain.    Yes [provider]  albuterol (PROVENTIL HFA;VENTOLIN HFA) 108 (90 BASE) MCG/ACT inhaler Inhale 2 puffs into the lungs every 6 (six) hours as needed for wheezing or shortness of breath. 01/10/15  Yes Gladstone Lighter, MD  aspirin EC 81 MG tablet Take 1 tablet (81 mg total) by mouth daily. 01/10/15  Yes Gladstone Lighter, MD  atorvastatin (LIPITOR) 80 MG tablet Take 1 tablet (80 mg total) by mouth daily at 6 PM. 01/10/15  Yes Gladstone Lighter, MD  carvedilol (COREG) 25 MG tablet Take 1  tablet (25 mg total) by mouth 2 (two) times daily with a meal. 01/10/15  Yes Gladstone Lighter, MD  cetirizine (ZYRTEC) 10 MG tablet Take 10 mg by mouth daily.   Yes [provider]  Cholecalciferol (VITAMIN D) 2000 units tablet Take 2,000 Units by mouth daily.   Yes [provider]  diphenhydrAMINE (BENADRYL) 25 mg capsule Take 1-2 tablets by mouth every 6 hours as needed Patient taking differently: Take 25-50 mg  by mouth every 6 (six) hours as needed for allergies.  01/14/16  Yes Hagler, Jami L, PA-C  exenatide (BYETTA) 5 MCG/0.02ML SOPN injection Inject 5 mcg into the skin 2 (two) times daily. 11/05/16  Yes [provider]  hydrochlorothiazide (MICROZIDE) 12.5 MG capsule Take 12.5 mg by mouth daily.   Yes [provider]  insulin aspart protamine- aspart (NOVOLOG MIX 70/30) (70-30) 100 UNIT/ML injection Inject 0.35 mLs (35 Units total) into the skin 2 (two) times daily with a meal. Patient taking differently: Inject 30 Units into the skin 2 (two) times daily with a meal.  01/10/15  Yes Gladstone Lighter, MD  isosorbide-hydrALAZINE (BIDIL) 20-37.5 MG per tablet Take 1 tablet by mouth 3 (three) times daily. 01/10/15  Yes Gladstone Lighter, MD  nitroGLYCERIN (NITROSTAT) 0.4 MG SL tablet Place 1 tablet (0.4 mg total) under the tongue every 5 (five) minutes x 3 doses as needed for chest pain. 12/19/14  Yes Charolette Forward, MD  omeprazole (PRILOSEC) 20 MG capsule Take 1 capsule (20 mg total) by mouth daily. 01/10/15  Yes Gladstone Lighter, MD  pioglitazone (ACTOS) 15 MG tablet Take 15 mg by mouth daily.   Yes [provider]  prasugrel (EFFIENT) 10 MG TABS tablet Take 1 tablet (10 mg total) by mouth daily. 01/10/15  Yes Gladstone Lighter, MD    Allergies No known drug allergies  Family History  Problem Relation Age of Onset  . Cancer Mother        Lung  . Cancer Father        Lung  . Diabetes Sister   . Diabetes Brother   . CAD Brother   . Hernia Son   . Cancer Brother     Social History Social History  Substance Use Topics  . Smoking status: Former Smoker    Packs/day: 0.00    Years: 30.00    Types: Cigarettes    Start date: 08/31/1984    Quit date: 11/30/2014  . Smokeless tobacco: Never Used  . Alcohol use Yes     Comment: Rarely, twice a year.    Review of Systems Constitutional: No fever/chills Eyes: No visual changes. ENT: No sore throat. Cardiovascular: Denies  chest pain. Respiratory: Positive for shortness of breath. Gastrointestinal: No abdominal pain.  No nausea, no vomiting.  No diarrhea.  No constipation. Genitourinary: Negative for dysuria. Musculoskeletal: Negative for neck pain.  Negative for back pain. Positive for bilateral lower extremity swelling Integumentary: Negative for rash. Neurological: Negative for headaches, focal weakness or numbness.   ____________________________________________   PHYSICAL EXAM:  VITAL SIGNS: ED Triage Vitals  Enc Vitals Group     BP 01/17/17 0056 (!) 179/104     Pulse Rate 01/17/17 0056 83     Resp 01/17/17 0056 (!) 30     Temp 01/17/17 0056 97.7 F (36.5 C)     Temp Source 01/17/17 0056 Axillary     SpO2 01/17/17 0056 98 %     Weight 01/17/17 0057 246 lb (111.6 kg)     Height  01/17/17 0057 5\' 10"  (1.778 m)     Head Circumference --      Peak Flow --      Pain Score 01/17/17 0056 0     Pain Loc --      Pain Edu? --      Excl. in [place]? --     Constitutional: Alert and oriented. Apparent respiratory distress  Eyes: Conjunctivae are normal.  Head: Atraumatic. Mouth/Throat: Mucous membranes are moist. Oropharynx non-erythematous. Neck: No stridor.   Cardiovascular: Normal rate, regular rhythm. Good peripheral circulation. Grossly normal heart sounds. Respiratory: Tachypnea, positive accessory respiratory muscle use, bibasilar rales Gastrointestinal: Soft and nontender. No distention.  Musculoskeletal: 1+ bilateral extremity edema No gross deformities of extremities. Neurologic:  Normal speech and language. No gross focal neurologic deficits are appreciated.  Skin:  Skin is warm, dry and intact. No rash noted. Psychiatric: Mood and affect are normal. Speech and behavior are normal.  ____________________________________________   LABS (all labs ordered are listed, but only abnormal results are displayed)  Labs Reviewed  CBC - Abnormal; Notable for the following:       Result Value    WBC 14.4 (*)    RBC 3.31 (*)    Hemoglobin 10.1 (*)    HCT 30.1 (*)    RDW 15.4 (*)    All other components within normal limits  TROPONIN I - Abnormal; Notable for the following:    Troponin I 0.03 (*)    All other components within normal limits  COMPREHENSIVE METABOLIC PANEL - Abnormal; Notable for the following:    Chloride 116 (*)    CO2 21 (*)    BUN 41 (*)    Creatinine, Ser 5.75 (*)    Calcium 7.3 (*)    Total Protein 6.4 (*)    Albumin 2.9 (*)    GFR calc non Af Amer 10 (*)    GFR calc Af Amer 11 (*)    All other components within normal limits  BRAIN NATRIURETIC PEPTIDE - Abnormal; Notable for the following:    B Natriuretic Peptide 205.0 (*)    All other components within normal limits  MRSA PCR SCREENING  TSH   ____________________________________________  EKG  ED ECG REPORT I, [place] N Jamez Ambrocio, the attending physician, personally viewed and interpreted this ECG.   Date: 01/17/2017  EKG Time: 12:52 AM  Rate: 85  Rhythm: Normal sinus rhythm  Axis: Normal  Intervals: Normal  ST&T Change: None  ____________________________________________  RADIOLOGY I, [place] Camyah Pultz, personally viewed and evaluated these images (plain radiographs) as part of my medical decision making, as well as reviewing the written report by the radiologist.  Dg Chest Portable 1 View  Result Date: 01/17/2017 CLINICAL DATA:  Acute onset of respiratory distress EXAM: PORTABLE CHEST 1 VIEW COMPARISON:  11/13/2016 FINDINGS: Stable cardiomegaly. No aortic aneurysm. Pulmonary venous congestion is stable. No pneumonic consolidation, effusion or pneumothorax. No acute nor suspicious osseous abnormalities. IMPRESSION: Stable cardiomegaly with mild pulmonary vascular congestion. Electronically Signed   By: Ashley Royalty M.D.   On: 01/17/2017 01:28    ____________________________________________   PROCEDURES  Critical Care performed: CRITICAL CARE Performed by: Gregor Hams   Total  critical care time: 45 minutes  Critical care time was exclusive of separately billable procedures and treating other patients.  Critical care was necessary to treat or prevent imminent or life-threatening deterioration.  Critical care was time spent personally by me on the following activities: development of treatment plan with patient  and/or surrogate as well as nursing, discussions with consultants, evaluation of patient's response to treatment, examination of patient, obtaining history from patient or surrogate, ordering and performing treatments and interventions, ordering and review of laboratory studies, ordering and review of radiographic studies, pulse oximetry and re-evaluation of patient's condition.   Procedures   ____________________________________________   INITIAL IMPRESSION / ASSESSMENT AND PLAN / ED COURSE  Pertinent labs & imaging results that were available during my care of the patient were reviewed by me and considered in my medical decision making (see chart for details).  56 year old male presenting to the emergency department will rest or distress. History of physical exam consistent with acute CHF exacerbation. BiPAP applied immediately upon arrival to the emergency department Lasix 40 mg IV given. Labetalol 10 mg IV given Nitropaste 2 inches remain on the patient's chest. Patient's blood pressure improved while in the emergency department decreasing to 141/99. Patient's work of breathing markedly improved. Patient discussed with Dr. Marcille Blanco for hospital admission for further evaluation and management      ____________________________________________  FINAL CLINICAL IMPRESSION(S) / ED DIAGNOSES  Final diagnoses:  Acute on chronic congestive heart failure, unspecified heart failure type ([place])     MEDICATIONS GIVEN DURING THIS VISIT:  Medications  nitroGLYCERIN (NITROSTAT) SL tablet 0.4 mg (not administered)  aspirin EC tablet 81 mg (not administered)    atorvastatin (LIPITOR) tablet 80 mg (not administered)  carvedilol (COREG) tablet 25 mg (not administered)  isosorbide-hydrALAZINE (BIDIL) 20-37.5 MG per tablet 1 tablet (not administered)  pantoprazole (PROTONIX) EC tablet 40 mg (not administered)  prasugrel (EFFIENT) tablet 10 mg (not administered)  cholecalciferol (VITAMIN D) tablet 2,000 Units (not administered)  hydrochlorothiazide (MICROZIDE) capsule 12.5 mg (not administered)  loratadine (CLARITIN) tablet 10 mg (not administered)  albuterol (PROVENTIL) (2.5 MG/3ML) 0.083% nebulizer solution 2.5 mg (not administered)  heparin injection 5,000 Units (not administered)  ondansetron (ZOFRAN) tablet 4 mg (not administered)    Or  ondansetron (ZOFRAN) injection 4 mg (not administered)  acetaminophen (TYLENOL) tablet 650 mg (not administered)    Or  acetaminophen (TYLENOL) suppository 650 mg (not administered)  docusate sodium (COLACE) capsule 100 mg (not administered)  insulin glargine (LANTUS) injection 42 Units (not administered)  insulin aspart (novoLOG) injection 0-20 Units (not administered)  furosemide (LASIX) injection 80 mg (not administered)  furosemide (LASIX) injection 40 mg (40 mg Intravenous Given 01/17/17 0049)  labetalol (NORMODYNE,TRANDATE) injection 10 mg (10 mg Intravenous Given 01/17/17 0106)  morphine 2 MG/ML injection 2 mg (2 mg Intravenous Given 01/17/17 0204)  ondansetron (ZOFRAN) injection 4 mg (4 mg Intravenous Given 01/17/17 0204)  furosemide (LASIX) injection 40 mg (40 mg Intravenous Given 01/17/17 0236)  nitroGLYCERIN (NITROGLYN) 2 % ointment 2 inch (2 inches Topical Given by Other 01/17/17 0048)     NEW OUTPATIENT MEDICATIONS STARTED DURING THIS VISIT:  New Prescriptions   No medications on file    Modified Medications   No medications on file    Discontinued Medications   No medications on file     Note:  This document was prepared using Dragon voice recognition software and may include  unintentional dictation errors.    Gregor Hams, MD 01/17/17 [phone]

## 2017-01-17 NOTE — ED Notes (Signed)
ED Provider at bedside. 

## 2017-01-17 NOTE — Progress Notes (Signed)
maalox given to the pt for indigestion per verbal order

## 2017-01-17 NOTE — Consult Note (Signed)
PULMONARY / CRITICAL CARE MEDICINE   Name: Scott [name] MRN: [id number] DOB: 13-Jul-1961    ADMISSION DATE:  01/17/2017   CONSULTATION DATE:  01/17/17  REFERRING MD:  Jannifer [place]  REASON: Acute hypoxic respiratory failure  CHIEF COMPLAINT: progressive dyspnea  HISTORY OF PRESENT ILLNESS:  This is a 56 y/o Caucasian male with multiple comorbid conditions who presented to the ED with worsening dyspnea and hypoxia. Patient states that symptoms started a few days ago and progressively got worse hence he decided to come to the ED. Associated symptoms include orthopnea, lower extremity edema and nonproductive cough. Denies fever, nausea, and vomiting. At the ED, he was hypoxic with SPO2 of 70%. He was placed on CPAP, started on nitropaste and lasix. PCCM consulted for ICU management.    PAST MEDICAL HISTORY :  He  has a past medical history of Arthritis; Asthma; CHF (congestive heart failure) ([place]); Chronic disease anemia; Chronic kidney disease (CKD), stage IV (severe) ([place]); Coronary artery disease; Depression; Dysrhythmia; GERD (gastroesophageal reflux disease); High cholesterol; Hypertension; Non-Q wave myocardial infarction [facility]); PVD (peripheral vascular disease) ([place]); and Type II diabetes mellitus ([place]).  PAST SURGICAL HISTORY: He  has a past surgical history that includes Toe amputation (Right, 2015); Incision and drainage of wound (Right, ~ 10/2014); Cardiac catheterization (11/2014); percutaneous coronary stent intervention (pci-s) (N/A, 12/14/2014); and percutaneous coronary stent intervention (pci-s) (N/A, 12/18/2014).  No Known Allergies  No current facility-administered medications on file prior to encounter.    Current Outpatient Prescriptions on File Prior to Encounter  Medication Sig  . acetaminophen (TYLENOL) 325 MG tablet Take 325 mg by mouth every 6 (six) hours as needed for mild pain.   [name] albuterol (PROVENTIL HFA;VENTOLIN HFA) 108 (90 BASE) MCG/ACT inhaler Inhale 2 puffs into the  lungs every 6 (six) hours as needed for wheezing or shortness of breath.  [name] aspirin EC 81 MG tablet Take 1 tablet (81 mg total) by mouth daily.  [name] atorvastatin (LIPITOR) 80 MG tablet Take 1 tablet (80 mg total) by mouth daily at 6 PM.  . carvedilol (COREG) 25 MG tablet Take 1 tablet (25 mg total) by mouth 2 (two) times daily with a meal.  . cetirizine (ZYRTEC) 10 MG tablet Take 10 mg by mouth daily.  . Cholecalciferol (VITAMIN D) 2000 units tablet Take 2,000 Units by mouth daily.  . diphenhydrAMINE (BENADRYL) 25 mg capsule Take 1-2 tablets by mouth every 6 hours as needed (Patient taking differently: Take 25-50 mg by mouth every 6 (six) hours as needed for allergies. )  . exenatide (BYETTA) 5 MCG/0.02ML SOPN injection Inject 5 mcg into the skin 2 (two) times daily.  . hydrochlorothiazide (MICROZIDE) 12.5 MG capsule Take 12.5 mg by mouth daily.  . insulin aspart protamine- aspart (NOVOLOG MIX 70/30) (70-30) 100 UNIT/ML injection Inject 0.35 mLs (35 Units total) into the skin 2 (two) times daily with a meal. (Patient taking differently: Inject 30 Units into the skin 2 (two) times daily with a meal. )  . isosorbide-hydrALAZINE (BIDIL) 20-37.5 MG per tablet Take 1 tablet by mouth 3 (three) times daily.  . nitroGLYCERIN (NITROSTAT) 0.4 MG SL tablet Place 1 tablet (0.4 mg total) under the tongue every 5 (five) minutes x 3 doses as needed for chest pain.  [name] omeprazole (PRILOSEC) 20 MG capsule Take 1 capsule (20 mg total) by mouth daily.  . pioglitazone (ACTOS) 15 MG tablet Take 15 mg by mouth daily.  . prasugrel (EFFIENT) 10 MG TABS tablet Take 1 tablet (10 mg  total) by mouth daily.    FAMILY HISTORY:  His indicated that his mother is deceased. He indicated that his father is deceased. He indicated that his sister is alive. He indicated that only one of his two brothers is alive. He indicated that his son is alive.    SOCIAL HISTORY: He  reports that he quit smoking about 2 years ago. His smoking use  included Cigarettes. He started smoking about 32 years ago. He smoked 0.00 packs per day for 30.00 years. He has never used smokeless tobacco. He reports that he drinks alcohol. He reports that he does not use drugs.  REVIEW OF SYSTEMS:   Constitutional: Negative for fever and chills.  HENT: Negative for congestion and rhinorrhea.  Eyes: Negative for redness and visual disturbance.  Respiratory: Negative for shortness of breath and wheezing.  Cardiovascular: positive for chest pain, dyspnea and PND.  Gastrointestinal: Negative  for nausea , vomiting and abdominal pain and  Loose stools Genitourinary: Negative for dysuria and urgency.  Endocrine: Denies polyuria, polyphagia and heat intolerance Musculoskeletal: Negative for myalgias and arthralgias.  Skin: Negative for pallor and wound.  Neurological: Negative for dizziness and headaches   SUBJECTIVE:   VITAL SIGNS: BP (!) 167/97   Pulse 76   Temp 97.7 F (36.5 C) (Axillary)   Resp 14   Ht 5\' 10"  (1.778 m)   Wt 245 lb 13 oz (111.5 kg)   SpO2 98%   BMI 35.27 kg/m   HEMODYNAMICS:    VENTILATOR SETTINGS:    INTAKE / OUTPUT: I/O last 3 completed shifts: In: -  Out: 325 [Urine:325]  PHYSICAL EXAMINATION: General: Moderate respiratory distress Neuro: AAO x 4, no focal deficits HEENT: [place]/AT, PERRLA, mild JVD Cardiovascular: RRR, S1/S2, no MRG, +1 edema Lungs: CTAB, diminished in the bases, +crackles bilateral bases Abdomen: Normal bowel sounds Musculoskeletal:  No deformities Skin: no rash or lesions  LABS:  BMET  Recent Labs Lab 01/17/17 0050  NA 143  K 4.6  CL 116*  CO2 21*  BUN 41*  CREATININE 5.75*  GLUCOSE 97    Electrolytes  Recent Labs Lab 01/17/17 0050  CALCIUM 7.3*    CBC  Recent Labs Lab 01/17/17 0050  WBC 14.4*  HGB 10.1*  HCT 30.1*  PLT 315    Coag's No results for input(s): APTT, INR in the last 168 hours.  Sepsis Markers No results for input(s): LATICACIDVEN,  PROCALCITON, O2SATVEN in the last 168 hours.  ABG No results for input(s): PHART, PCO2ART, PO2ART in the last 168 hours.  Liver Enzymes  Recent Labs Lab 01/17/17 0050  AST 23  ALT 19  ALKPHOS 99  BILITOT 0.4  ALBUMIN 2.9*    Cardiac Enzymes  Recent Labs Lab 01/17/17 0050  TROPONINI 0.03*    Glucose  Recent Labs Lab 01/17/17 0401 01/17/17 0739  GLUCAP 105* 97    Imaging Dg Chest Portable 1 View  Result Date: 01/17/2017 CLINICAL DATA:  Acute onset of respiratory distress EXAM: PORTABLE CHEST 1 VIEW COMPARISON:  11/13/2016 FINDINGS: Stable cardiomegaly. No aortic aneurysm. Pulmonary venous congestion is stable. No pneumonic consolidation, effusion or pneumothorax. No acute nor suspicious osseous abnormalities. IMPRESSION: Stable cardiomegaly with mild pulmonary vascular congestion. Electronically Signed   By: Ashley Royalty M.D.   On: 01/17/2017 01:28    STUDIES:  11/13/16: LV EF: 55% -   60%  SIGNIFICANT EVENTS: 05/19>Admitted   LINES/TUBES: PIVs  DISCUSSION: This is a 56 y/o WM presenting with acute CHF exacerbation, uncontrolled hypertension  and acute hypoxic respiratory failure  ASSESSMENT  Acute hypoxic respiratory failure Acute CHF exacerbation Uncontrolled hypertension CKD T2DM H/O CAD s/p MI H/O Asthma  PLAN BiPAP prn and titrate to [place] as tolerated Optimal BP control with prn labetalol and home medications Lasix IV Nitroglycerin topical Resume home dose of beta blocker Blood glucose monitoring with SSI coverage and HS Lantus GI/DVT prophylaxis   FAMILY  - Updates: No family at bedside. Will update when available.  - Inter-disciplinary family meet or Palliative Care meeting due by:  day 7  Plan of care discussed with Dr. Mortimer Fries and Warren Lacy MD.  Arlyss Gandy. [facility] ANP-BC Pulmonary and Critical Care Medicine [facility] Pager [phone] or [phone]  01/17/2017, 8:09 AM

## 2017-01-17 NOTE — Progress Notes (Signed)
Pt alert and oriented x4, no complaints of pain or discomfort.  Bed in low position, call bell within reach.  Bed alarms on and functioning.  Assessment done and charted.  Will continue to monitor and do hourly rounding throughout the shift 

## 2017-01-17 NOTE — Progress Notes (Signed)
Notified Dr Mortimer Fries of patient's rise in troponin level from 0.03 to 0.87.  Acknowledged.  No new orders.

## 2017-01-17 NOTE — ED Notes (Signed)
Called respiratory for assistance with transporting patient to CCU. Updated patient on current plan of care.

## 2017-01-17 NOTE — ED Notes (Signed)
Labetalol given. BP taken - BP 172/99 post Labetalol. BP frequency change to q15 minutes per MD order

## 2017-01-17 NOTE — ED Triage Notes (Signed)
Per EMS: Pt c/o respiratory distress. Pt's oxygen saturation level at 70 % on RA. Pt placed on CPAP.

## 2017-01-17 NOTE — Progress Notes (Addendum)
eLink Physician-Brief Progress Note Patient Name: Scott [name] DOB: [birth year] MRN: [id number]   Date of Service  01/17/2017  HPI/Events of Note  Htn, HFpEF, CKD 3, CAD with stents adm with acute on chronic dCHF  eICU Interventions  Good satn on BiPAP 12/5, 30%, wob ok, trop neg     Intervention Category Evaluation Type: New Patient Evaluation  [name] V. 01/17/2017, 5:04 AM

## 2017-01-18 ENCOUNTER — Inpatient Hospital Stay
Admission: EM | Disposition: A | Discharge status: Home / Self Care | Payer: Self-pay | Source: Home / Self Care | Attending: Internal Medicine

## 2017-01-18 ENCOUNTER — Encounter
Admission: EM | Disposition: A | Discharge status: Home / Self Care | Payer: Self-pay | Source: Home / Self Care | Attending: Internal Medicine

## 2017-01-18 DIAGNOSIS — N185 Chronic kidney disease, stage 5: Secondary | ICD-10-CM

## 2017-01-18 DIAGNOSIS — R0602 Shortness of breath: Secondary | ICD-10-CM

## 2017-01-18 DIAGNOSIS — I509 Heart failure, unspecified: Secondary | ICD-10-CM

## 2017-01-18 DIAGNOSIS — I10 Essential (primary) hypertension: Secondary | ICD-10-CM

## 2017-01-18 HISTORY — PX: DIALYSIS/PERMA CATHETER INSERTION: CATH118288

## 2017-01-18 LAB — CBC
HCT: 25.6 % — ABNORMAL LOW (ref 40.0–52.0)
HCT: 24.8 % — ABNORMAL LOW (ref 40.0–52.0)
Hemoglobin: 8.4 g/dL — ABNORMAL LOW (ref 13.0–18.0)
Hemoglobin: 8.5 g/dL — ABNORMAL LOW (ref 13.0–18.0)
MCH: 29.9 pg (ref 26.0–34.0)
MCH: 31 pg (ref 26.0–34.0)
MCHC: 32.9 g/dL (ref 32.0–36.0)
MCHC: 34.2 g/dL (ref 32.0–36.0)
MCV: 90.9 fL (ref 80.0–100.0)
MCV: 90.6 fL (ref 80.0–100.0)
PLATELETS: 266 10*3/uL (ref 150–440)
Platelets: 281 10*3/uL (ref 150–440)
RBC: 2.82 MIL/uL — ABNORMAL LOW (ref 4.40–5.90)
RBC: 2.74 MIL/uL — ABNORMAL LOW (ref 4.40–5.90)
RDW: 15.6 % — AB (ref 11.5–14.5)
RDW: 14.9 % — AB (ref 11.5–14.5)
WBC: 8.3 10*3/uL (ref 3.8–10.6)
WBC: 3.7 10*3/uL — ABNORMAL LOW (ref 3.8–10.6)

## 2017-01-18 LAB — GLUCOSE, CAPILLARY
Glucose-Capillary: 120 mg/dL — ABNORMAL HIGH (ref 65–99)
Glucose-Capillary: 143 mg/dL — ABNORMAL HIGH (ref 65–99)
Glucose-Capillary: 121 mg/dL — ABNORMAL HIGH (ref 65–99)
Glucose-Capillary: 85 mg/dL (ref 65–99)

## 2017-01-18 LAB — BASIC METABOLIC PANEL
ANION GAP: 5 (ref 5–15)
BUN: 47 mg/dL — ABNORMAL HIGH (ref 6–20)
CALCIUM: 7.2 mg/dL — AB (ref 8.9–10.3)
CO2: 21 mmol/L — AB (ref 22–32)
Chloride: 117 mmol/L — ABNORMAL HIGH (ref 101–111)
Creatinine, Ser: 6.33 mg/dL — ABNORMAL HIGH (ref 0.61–1.24)
GFR calc Af Amer: 10 mL/min — ABNORMAL LOW (ref >60)
GFR calc non Af Amer: 9 mL/min — ABNORMAL LOW (ref >60)
GLUCOSE: 131 mg/dL — AB (ref 65–99)
POTASSIUM: 4.4 mmol/L (ref 3.5–5.1)
Sodium: 143 mmol/L (ref 135–145)

## 2017-01-18 LAB — PHOSPHORUS: PHOSPHORUS: 4.9 mg/dL — AB (ref 2.5–4.6)

## 2017-01-18 SURGERY — DIALYSIS/PERMA CATHETER INSERTION
Anesthesia: Moderate Sedation

## 2017-01-18 MED ORDER — MIDAZOLAM HCL 5 MG/5ML IJ SOLN
INTRAMUSCULAR | Status: AC
  Filled 2017-01-18: qty 5

## 2017-01-18 MED ORDER — TUBERCULIN PPD 5 UNIT/0.1ML ID SOLN
5.0000 [IU] | Freq: Once | INTRADERMAL | Status: AC
  Administered 2017-01-18: 5 [IU] via INTRADERMAL
  Filled 2017-01-18: qty 0.1

## 2017-01-18 MED ORDER — SODIUM CHLORIDE 0.9 % IV SOLN: INTRAVENOUS | Status: DC

## 2017-01-18 MED ORDER — CEFAZOLIN SODIUM-DEXTROSE 1-4 GM/50ML-% IV SOLN
INTRAVENOUS | Status: AC
  Filled 2017-01-18: qty 50

## 2017-01-18 MED ORDER — FENTANYL CITRATE (PF) 100 MCG/2ML IJ SOLN
INTRAMUSCULAR | Status: AC
  Filled 2017-01-18: qty 2

## 2017-01-18 MED ORDER — SODIUM CHLORIDE 0.9% FLUSH
3.0000 mL | Freq: Two times a day (BID) | INTRAVENOUS | Status: DC
  Administered 2017-01-18 – 2017-01-20 (×5): 3 mL via INTRAVENOUS

## 2017-01-18 MED ORDER — METOCLOPRAMIDE HCL 10 MG PO TABS
5.0000 mg | ORAL_TABLET | Freq: Every day | ORAL | Status: DC
  Administered 2017-01-19 – 2017-01-20 (×2): 5 mg via ORAL
  Filled 2017-01-18 (×2): qty 1

## 2017-01-18 MED ORDER — MIDAZOLAM HCL 2 MG/2ML IJ SOLN
INTRAMUSCULAR | Status: DC | PRN
  Administered 2017-01-18: 2 mg via INTRAVENOUS

## 2017-01-18 MED ORDER — FENTANYL CITRATE (PF) 100 MCG/2ML IJ SOLN
INTRAMUSCULAR | Status: DC | PRN
  Administered 2017-01-18: 50 ug via INTRAVENOUS

## 2017-01-18 MED ORDER — HEPARIN SODIUM (PORCINE) 10000 UNIT/ML IJ SOLN
INTRAMUSCULAR | Status: AC
  Filled 2017-01-18: qty 1

## 2017-01-18 MED ORDER — LIDOCAINE-EPINEPHRINE (PF) 2 %-1:200000 IJ SOLN
INTRAMUSCULAR | Status: AC
  Filled 2017-01-18: qty 20

## 2017-01-18 SURGICAL SUPPLY — 8 items
CATH PALINDROME RT-P 15FX19CM (CATHETERS) ×3 IMPLANT
COVER PROBE U/S 5X48 (MISCELLANEOUS) ×3 IMPLANT
DERMABOND ADVANCED (GAUZE/BANDAGES/DRESSINGS) ×2
DERMABOND ADVANCED .7 DNX12 (GAUZE/BANDAGES/DRESSINGS) ×1 IMPLANT
PACK ANGIOGRAPHY (CUSTOM PROCEDURE TRAY) ×3 IMPLANT
SUT MNCRL AB 4-0 PS2 18 (SUTURE) ×3 IMPLANT
SUT PROLENE 0 CT 1 30 (SUTURE) ×3 IMPLANT
TOWEL OR 17X26 4PK STRL BLUE (TOWEL DISPOSABLE) ×3 IMPLANT

## 2017-01-18 NOTE — Progress Notes (Signed)
[facility] [facility] [place] NAME: Scott [name]    MR#:  [id number]  DATE OF BIRTH:  11-05-1960  SUBJECTIVE:  CHIEF COMPLAINT:   Chief Complaint  Patient presents with  . Respiratory Distress   - breathing is much improved. Off oxygen and on room air. - denies any chest pain  REVIEW OF SYSTEMS:  Review of Systems  Constitutional: Negative for chills, fever and malaise/fatigue.  HENT: Negative for congestion, ear discharge, hearing loss and nosebleeds.   Eyes: Negative for blurred vision and double vision.  Respiratory: Positive for shortness of breath. Negative for cough and wheezing.   Cardiovascular: Negative for chest pain, palpitations and leg swelling.  Gastrointestinal: Negative for abdominal pain, constipation, diarrhea, nausea and vomiting.  Genitourinary: Negative for dysuria.  Musculoskeletal: Negative for myalgias.  Neurological: Negative for dizziness, speech change, focal weakness, seizures and headaches.  Psychiatric/Behavioral: Negative for depression and suicidal ideas.    DRUG ALLERGIES:  No Known Allergies  VITALS:  Blood pressure 136/69, pulse 71, temperature 98.2 F (36.8 C), temperature source Oral, resp. rate 18, height 5\' 10"  (1.778 m), weight 109.6 kg (241 lb 9.6 oz), SpO2 95 %.  PHYSICAL EXAMINATION:  Physical Exam  GENERAL:  56 y.o.-year-old  Overweight patient lying in the bed with no acute distress.  EYES: Pupils equal, round, reactive to light and accommodation. No scleral icterus. Extraocular muscles intact.  HEENT: Head atraumatic, normocephalic. Oropharynx and nasopharynx clear.  NECK:  Supple, no jugular venous distention. No thyroid enlargement, no tenderness.  LUNGS: Normal breath sounds bilaterally, no wheezing, rales,rhonchi or crepitation. No use of accessory muscles of respiration. Decreased bibasilar breath sounds CARDIOVASCULAR: S1, S2 normal. No murmurs, rubs, or gallops.  ABDOMEN: Soft,  nontender, nondistended. Bowel sounds present. No organomegaly or mass.  EXTREMITIES: No pedal edema, cyanosis, or clubbing.  NEUROLOGIC: Cranial nerves II through XII are intact. Muscle strength 5/5 in all extremities. Sensation intact. Gait not checked.  PSYCHIATRIC: The patient is alert and oriented x 3.  SKIN: No obvious rash, lesion, or ulcer.    LABORATORY PANEL:   CBC  Recent Labs Lab 01/18/17 0518  WBC 8.3  HGB 8.4*  HCT 25.6*  PLT 281   ------------------------------------------------------------------------------------------------------------------  Chemistries   Recent Labs Lab 01/17/17 0050 01/18/17 0518  NA 143 143  K 4.6 4.4  CL 116* 117*  CO2 21* 21*  GLUCOSE 97 131*  BUN 41* 47*  CREATININE 5.75* 6.33*  CALCIUM 7.3* 7.2*  AST 23  --   ALT 19  --   ALKPHOS 99  --   BILITOT 0.4  --    ------------------------------------------------------------------------------------------------------------------  Cardiac Enzymes  Recent Labs Lab 01/17/17 1942  TROPONINI 1.20*   ------------------------------------------------------------------------------------------------------------------  RADIOLOGY:  Dg Chest Portable 1 View  Result Date: 01/17/2017 CLINICAL DATA:  Acute onset of respiratory distress EXAM: PORTABLE CHEST 1 VIEW COMPARISON:  11/13/2016 FINDINGS: Stable cardiomegaly. No aortic aneurysm. Pulmonary venous congestion is stable. No pneumonic consolidation, effusion or pneumothorax. No acute nor suspicious osseous abnormalities. IMPRESSION: Stable cardiomegaly with mild pulmonary vascular congestion. Electronically Signed   By: Ashley Royalty M.D.   On: 01/17/2017 01:28    EKG:   Orders placed or performed during the hospital encounter of 01/17/17  . ED EKG within 10 minutes  . ED EKG within 10 minutes  . EKG 12-Lead  . EKG 12-Lead    ASSESSMENT AND PLAN:   56 year old male with past medical history significant for diastolic congestive  heart failure, CK D stage IV, CAD, depression, hyperlipidemia results to hospital secondary to difficulty breathing and noted to have hypoxia.  #1 acute hypoxic respiratory failure-secondary to acute on chronic diastolic CHF exacerbation. -Recent echocardiogram with normal ejection fraction. Required BiPAP and admission. Much improved breathing. - off oxygen now -no lasix now if initiated on dialysis  #2 CK D stage IV-patient's previous baseline creatinine used to be around 2.7, last admission 2 months ago his creatinine worsened up to 5 -Continue to monitor urine output. nephrology consultation and patient might need to be initiated on dialysis this admission  #3 diabetes mellitus-currently on Lantus and sliding scale insulin.  #4 elevated troponin- demand ischemia, no chest pain - but previously positive stress test with inf wall defect- appreciate cardiology consult - due to his underlying renal disease- cath might not be feasible at this time, especially as troponin trending down and also patient asymptomatic - so continue aspirin, Effient, Coreg, Imdur and statin Encourage ambulation  #5 DVT prophylaxis-on subcutaneous heparin     All the records are reviewed and case discussed with Care Management/Social Workerr. Management plans discussed with the patient, family and they are in agreement.  CODE STATUS: Full code  TOTAL TIME TAKING CARE OF THIS PATIENT: 38 minutes.   POSSIBLE D/C IN 2 DAYS, DEPENDING ON CLINICAL CONDITION.   Gladstone Lighter M.D on 01/18/2017 at 10:17 AM  Between 7am to 6pm - Pager - [phone]  After 6pm go to [email or web address] - password [email or web address] [facility]  Office  [phone]  CC: Primary care physician; [employer]

## 2017-01-18 NOTE — Consult Note (Signed)
[facility] Vascular Consult Note  MRN : [id number]  Scott [name] is a 56 y.o. (1960-09-27) male who presents with chief complaint of  Chief Complaint  Patient presents with  . Respiratory Distress  .  History of Present Illness: I am asked by Dr. Juleen China to see the patient for dialysis access.  He has a known history of stage IV to 5 chronic kidney disease but is admitted with volume overload and congestive heart failure. He has not been overly compliant with his medical regimen. He has multiple causes of his renal failure including hypertension and diabetes. He has shortness of breath with minimal activity at this point. He has a cough productive of frothy sputum. He has swelling of his legs. His blood pressure is been markedly elevated. He denies fever or chills. He denies chest pain. He was given Lasix prior to admission but this did not improve his symptoms. He has had gradual worsening of symptoms over days to weeks. At this point, he needs dialysis and is felt he is likely end-stage renal disease and we are asked to place a PermCath for immediate and outpatient dialysis.  Current Facility-Administered Medications  Medication Dose Route Frequency Provider Last Rate Last Dose  . 0.9 %  sodium chloride infusion   Intravenous Continuous Algernon Huxley, MD      . Doug Sou Hold] acetaminophen (TYLENOL) tablet 650 mg  650 mg Oral Q6H PRN Harrie Foreman, MD       Or  . Doug Sou Hold] acetaminophen (TYLENOL) suppository 650 mg  650 mg Rectal Q6H PRN Harrie Foreman, MD      . Doug Sou Hold] albuterol (PROVENTIL) (2.5 MG/3ML) 0.083% nebulizer solution 2.5 mg  2.5 mg Nebulization Q4H PRN Harrie Foreman, MD      . Doug Sou Hold] alum & mag hydroxide-simeth (MAALOX/MYLANTA) 200-200-20 MG/5ML suspension 30 mL  30 mL Oral Q6H PRN Gladstone Lighter, MD   30 mL at 01/17/17 1502  . [MAR Hold] aspirin EC tablet 81 mg  81 mg Oral Daily Harrie Foreman, MD   81 mg at 01/18/17 0962  .  [MAR Hold] atorvastatin (LIPITOR) tablet 80 mg  80 mg Oral q1800 Harrie Foreman, MD   80 mg at 01/17/17 1736  . [MAR Hold] carvedilol (COREG) tablet 25 mg  25 mg Oral BID WC Harrie Foreman, MD      . Doug Sou Hold] cholecalciferol (VITAMIN D) tablet 2,000 Units  2,000 Units Oral Daily Harrie Foreman, MD   2,000 Units at 01/18/17 [phone]  . [MAR Hold] docusate sodium (COLACE) capsule 100 mg  100 mg Oral BID Harrie Foreman, MD   100 mg at 01/18/17 2947  . [MAR Hold] heparin injection 5,000 Units  5,000 Units Subcutaneous Q8H Harrie Foreman, MD   5,000 Units at 01/18/17 1303  . [MAR Hold] insulin aspart (novoLOG) injection 0-20 Units  0-20 Units Subcutaneous TID WC Harrie Foreman, MD   3 Units at 01/18/17 1639  . [MAR Hold] insulin glargine (LANTUS) injection 42 Units  42 Units Subcutaneous QHS Harrie Foreman, MD   42 Units at 01/17/17 2300  . [MAR Hold] isosorbide-hydrALAZINE (BIDIL) 20-37.5 MG per tablet 1 tablet  1 tablet Oral TID Harrie Foreman, MD   1 tablet at 01/18/17 [phone]  . [MAR Hold] labetalol (NORMODYNE,TRANDATE) injection 10 mg  10 mg Intravenous Q2H PRN Dorene Sorrow S, NP   10 mg at 01/17/17 1114  . [facility] Hold]  loratadine (CLARITIN) tablet 10 mg  10 mg Oral Daily Harrie Foreman, MD   10 mg at 01/18/17 0906  . [MAR Hold] metoCLOPramide (REGLAN) tablet 5 mg  5 mg Oral Q1500 Kolluru, Sarath, MD      . Doug Sou Hold] nitroGLYCERIN (NITROSTAT) SL tablet 0.4 mg  0.4 mg Sublingual Q5 Min x 3 PRN Harrie Foreman, MD      . Doug Sou Hold] ondansetron [facility]) tablet 4 mg  4 mg Oral Q6H PRN Harrie Foreman, MD       Or  . Doug Sou Hold] ondansetron [facility]) injection 4 mg  4 mg Intravenous Q6H PRN Harrie Foreman, MD      . Doug Sou Hold] pantoprazole (PROTONIX) EC tablet 40 mg  40 mg Oral Daily Harrie Foreman, MD   40 mg at 01/18/17 0906  . [MAR Hold] prasugrel (EFFIENT) tablet 10 mg  10 mg Oral Daily Harrie Foreman, MD   10 mg at 01/18/17 0906  . tuberculin  injection 5 Units  5 Units Intradermal Once Lavonia Dana, MD   5 Units at 01/18/17 1235    Past Medical History:  Diagnosis Date  . Arthritis    "left arm; right leg" (12/13/2014)  . Asthma   . CHF (congestive heart failure) ([place])   . Chronic disease anemia    [name] 12/13/2014  . Chronic kidney disease (CKD), stage IV (severe) ([place])    [name] 12/13/2014  . Coronary artery disease    [name] 12/13/2014  . Depression   . Dysrhythmia   . GERD (gastroesophageal reflux disease)   . High cholesterol    [name] 12/13/2014  . Hypertension   . Non-Q wave myocardial infarction ([place])    [name] 12/13/2014  . PVD (peripheral vascular disease) ([place])    [name] 12/13/2014  . Type II diabetes mellitus ([place])     Past Surgical History:  Procedure Laterality Date  . CARDIAC CATHETERIZATION  11/2014  . INCISION AND DRAINAGE OF WOUND Right ~ 10/2014   "4th toe foot"  . PERCUTANEOUS CORONARY STENT INTERVENTION (PCI-S) N/A 12/14/2014   Procedure: PERCUTANEOUS CORONARY STENT INTERVENTION (PCI-S);  Surgeon: Charolette Forward, MD;  Location: [facility] CATH LAB;  Service: Cardiovascular;  Laterality: N/A;  . PERCUTANEOUS CORONARY STENT INTERVENTION (PCI-S) N/A 12/18/2014   Procedure: PERCUTANEOUS CORONARY STENT INTERVENTION (PCI-S);  Surgeon: Charolette Forward, MD;  Location[facility] CATH LAB;  Service: Cardiovascular;  Laterality: N/A;  . TOE AMPUTATION Right 2015   4th toe    Social History Social History  Substance Use Topics  . Smoking status: Former Smoker    Packs/day: 0.00    Years: 30.00    Types: Cigarettes    Start date: 08/31/1984    Quit date: 11/30/2014  . Smokeless tobacco: Never Used  . Alcohol use Yes     Comment: Rarely, twice a year.  No IV drug use  Family History Family History  Problem Relation Age of Onset  . Cancer Mother        Lung  . Cancer Father        Lung  . Diabetes Sister   . Diabetes Brother   . CAD Brother   . Hernia Son   . Cancer Brother     No Known Allergies   REVIEW  OF SYSTEMS (Negative unless checked)  Constitutional: [] Weight loss  [] Fever  [] Chills Cardiac: [] Chest pain   [] Chest pressure   [x] Palpitations   [x] Shortness of breath when laying flat   [] Shortness of breath  at rest   [x] Shortness of breath with exertion. Vascular:  [] Pain in legs with walking   [] Pain in legs at rest   [] Pain in legs when laying flat   [] Claudication   [] Pain in feet when walking  [] Pain in feet at rest  [] Pain in feet when laying flat   [] History of DVT   [] Phlebitis   [x] Swelling in legs   [] Varicose veins   [] Non-healing ulcers Pulmonary:   [] Uses home oxygen   [x] Productive cough   [] Hemoptysis   [x] Wheeze  [] COPD   [x] Asthma Neurologic:  [] Dizziness  [] Blackouts   [] Seizures   [] History of stroke   [] History of TIA  [] Aphasia   [] Temporary blindness   [] Dysphagia   [] Weakness or numbness in arms   [] Weakness or numbness in legs Musculoskeletal:  [x] Arthritis   [] Joint swelling   [] Joint pain   [] Low back pain Hematologic:  [] Easy bruising  [] Easy bleeding   [] Hypercoagulable state   [x] Anemic  [] Hepatitis Gastrointestinal:  [] Blood in stool   [] Vomiting blood  [] Gastroesophageal reflux/heartburn   [] Difficulty swallowing. Genitourinary:  [x] Chronic kidney disease   [] Difficult urination  [] Frequent urination  [] Burning with urination   [] Blood in urine Skin:  [] Rashes   [] Ulcers   [] Wounds Psychological:  [] History of anxiety   []  History of major depression.  Physical Examination  Vitals:   01/18/17 0452 01/18/17 1140 01/18/17 1141 01/18/17 1658  BP: 136/69 (!) 181/83 (!) 176/82 (!) 175/83  Pulse: 71 73 71 69  Resp: 18  16   Temp: 98.2 F (36.8 C) 98 F (36.7 C)  98 F (36.7 C)  TempSrc: Oral Oral  Oral  SpO2: 95% 97% 97% 96%  Weight: 109.6 kg (241 lb 9.6 oz)     Height:       Body mass index is 34.67 kg/m. Gen:  WD/WN, NAD. Obese white male sitting up in bed Head: [place]/AT, No temporalis wasting.  Ear/Nose/Throat: Hearing grossly intact, nares w/o erythema  or drainage, oropharynx w/o Erythema/Exudate Eyes: Sclera non-icteric, conjunctiva clear Neck: Trachea midline.  No JVD.  Pulmonary:  Good air movement, respirations mildly labored on supplemental oxygen. Equal. Cardiac: RRR, normal S1, S2. Vascular:  Vessel Right Left  Radial Palpable Palpable                                   Gastrointestinal: soft, non-tender/non-distended.  Musculoskeletal: M/S 5/5 throughout.  Extremities without ischemic changes.  No deformity or atrophy. 2+ lower extremity edema. Neurologic: Sensation grossly intact in extremities.  Symmetrical.  Speech is fluent. Motor exam as listed above. Psychiatric: Judgment intact, Mood & affect appropriate for pt's clinical situation. Dermatologic: No rashes or ulcers noted.  No cellulitis or open wounds.       CBC Lab Results  Component Value Date   WBC 8.3 01/18/2017   HGB 8.4 (L) 01/18/2017   HCT 25.6 (L) 01/18/2017   MCV 90.9 01/18/2017   PLT 281 01/18/2017    BMET    Component Value Date/Time   NA 143 01/18/2017 0518   NA 140 04/16/2015   NA 140 12/13/2014 0102   K 4.4 01/18/2017 0518   K 4.6 12/13/2014 0102   CL 117 (H) 01/18/2017 0518   CL 111 12/13/2014 0102   CO2 21 (L) 01/18/2017 0518   CO2 23 12/13/2014 0102   GLUCOSE 131 (H) 01/18/2017 0518   GLUCOSE 213 (H) 12/13/2014 0102  BUN 47 (H) 01/18/2017 0518   BUN 44 (A) 04/16/2015   BUN 25 (H) 12/13/2014 0102   CREATININE 6.33 (H) 01/18/2017 0518   CREATININE 1.95 (H) 12/13/2014 0102   CALCIUM 7.2 (L) 01/18/2017 0518   CALCIUM 8.0 (L) 12/13/2014 0102   GFRNONAA 9 (L) 01/18/2017 0518   GFRNONAA 37 (L) 12/13/2014 0102   GFRAA 10 (L) 01/18/2017 0518   GFRAA 43 (L) 12/13/2014 0102   Estimated Creatinine Clearance: 15.8 mL/min (A) (by C-G formula based on SCr of 6.33 mg/dL (H)).  COAG Lab Results  Component Value Date   INR 1.08 01/10/2015   INR 1.10 12/14/2014   INR 0.9 12/01/2014    Radiology Dg Chest Portable 1  View  Result Date: 01/17/2017 CLINICAL DATA:  Acute onset of respiratory distress EXAM: PORTABLE CHEST 1 VIEW COMPARISON:  11/13/2016 FINDINGS: Stable cardiomegaly. No aortic aneurysm. Pulmonary venous congestion is stable. No pneumonic consolidation, effusion or pneumothorax. No acute nor suspicious osseous abnormalities. IMPRESSION: Stable cardiomegaly with mild pulmonary vascular congestion. Electronically Signed   By: Ashley Royalty M.D.   On: 01/17/2017 01:28      Assessment/Plan 1. Renal failure. Now felt to be end-stage renal disease. PermCath will be placed for immediate dialysis as well as outpatient dialysis for several weeks. He should be seen in the office with a vein mapping for evaluation for permanent dialysis access. I have discussed the differences between fistulas and grafts and PermCath and why we would like to place a fistula or graft in the arm. Risks and benefits of PermCath were discussed and he is agreeable to proceed 2. Diabetes. Likely an underlying cause of his renal failure. Glucose control important for renal function and also avoidance of atherosclerotic disease. 3. Hypertension. Likely an underlying cause of his renal failure and blood pressure control important in reducing the progression of atherosclerotic disease. On appropriate oral medications. 4. Volume overload. Likely secondary to #1 as well as possible heart disease as well.   Leotis Pain, MD  01/18/2017 5:51 PM    This note was created with Dragon medical transcription system.  Any error is purely unintentional

## 2017-01-18 NOTE — Progress Notes (Signed)
Hd completed 

## 2017-01-18 NOTE — Progress Notes (Signed)
Post dialysis assessment 

## 2017-01-18 NOTE — Progress Notes (Signed)
Medicated for bp per Dr. Juleen China.  Alert, no complaints, dialysis progressing as prescribed

## 2017-01-18 NOTE — Progress Notes (Signed)
[facility] Encounter Note  Patient: Scott [name] / Admit Date: 01/17/2017 / Date of Encounter: 01/18/2017, 8:41 AM   Subjective: Patient feeling much better today. No evidence of chest discomfort or shortness of breath with minimal ambulation. Troponin level peaked at 1.4 consistent with non-ST elevation myocardial infarction Stress test recently showed inferior infarct with mild ischemia inferior wall but normal perfusion to the anterior and posterior wall and current symptoms suggest maybe extension of previous infarct inferior wall  Review of Systems: Positive for: Weakness Negative for: Vision change, hearing change, syncope, dizziness, nausea, vomiting,diarrhea, bloody stool, stomach pain, cough, congestion, diaphoresis, urinary frequency, urinary pain,skin lesions, skin rashes Others previously listed  Objective: Telemetry: Normal sinus rhythm Physical Exam: Blood pressure 136/69, pulse 71, temperature 98.2 F (36.8 C), temperature source Oral, resp. rate 18, height 5\' 10"  (1.778 m), weight 109.6 kg (241 lb 9.6 oz), SpO2 95 %. Body mass index is 34.67 kg/m. General: Well developed, well nourished, in no acute distress. Head: Normocephalic, atraumatic, sclera non-icteric, no xanthomas, nares are without discharge. Neck: No apparent masses Lungs: Normal respirations with no wheezes, no rhonchi, no rales , no crackles   Heart: Regular rate and rhythm, normal S1 S2, no murmur, no rub, no gallop, PMI is normal size and placement, carotid upstroke normal without bruit, jugular venous pressure normal Abdomen: Soft, non-tender, non-distended with normoactive bowel sounds. No hepatosplenomegaly. Abdominal aorta is normal size without bruit Extremities: No edema, no clubbing, no cyanosis, no ulcers,  Peripheral: 2+ radial, 2+ femoral, 2+ dorsal pedal pulses Neuro: Alert and oriented. Moves all extremities spontaneously. Psych:  Responds to questions appropriately  with a normal affect.   Intake/Output Summary (Last 24 hours) at 01/18/17 0841 Last data filed at 01/18/17 0730  Gross per 24 hour  Intake                0 ml  Output              250 ml  Net             -250 ml    Inpatient Medications:  . aspirin EC  81 mg Oral Daily  . atorvastatin  80 mg Oral q1800  . [START ON 01/19/2017] carvedilol  25 mg Oral BID WC  . cholecalciferol  2,000 Units Oral Daily  . docusate sodium  100 mg Oral BID  . heparin  5,000 Units Subcutaneous Q8H  . insulin aspart  0-20 Units Subcutaneous TID WC  . insulin glargine  42 Units Subcutaneous QHS  . isosorbide-hydrALAZINE  1 tablet Oral TID  . loratadine  10 mg Oral Daily  . pantoprazole  40 mg Oral Daily  . prasugrel  10 mg Oral Daily   Infusions:   Labs:  Recent Labs  01/17/17 0050 01/18/17 0518  NA 143 143  K 4.6 4.4  CL 116* 117*  CO2 21* 21*  GLUCOSE 97 131*  BUN 41* 47*  CREATININE 5.75* 6.33*  CALCIUM 7.3* 7.2*    Recent Labs  01/17/17 0050  AST 23  ALT 19  ALKPHOS 99  BILITOT 0.4  PROT 6.4*  ALBUMIN 2.9*    Recent Labs  01/17/17 0050 01/18/17 0518  WBC 14.4* 8.3  HGB 10.1* 8.4*  HCT 30.1* 25.6*  MCV 91.1 90.9  PLT 315 281    Recent Labs  01/17/17 0050 01/17/17 0732 01/17/17 1331 01/17/17 1942  TROPONINI 0.03* 0.87* 1.46* 1.20*   Invalid input(s): POCBNP No results for input(s):  HGBA1C in the last 72 hours.   Weights: Filed Weights   01/17/17 0057 01/17/17 0448 01/18/17 0452  Weight: 111.6 kg (246 lb) 111.5 kg (245 lb 13 oz) 109.6 kg (241 lb 9.6 oz)     Radiology/Studies:  Dg Chest Portable 1 View  Result Date: 01/17/2017 CLINICAL DATA:  Acute onset of respiratory distress EXAM: PORTABLE CHEST 1 VIEW COMPARISON:  11/13/2016 FINDINGS: Stable cardiomegaly. No aortic aneurysm. Pulmonary venous congestion is stable. No pneumonic consolidation, effusion or pneumothorax. No acute nor suspicious osseous abnormalities. IMPRESSION: Stable cardiomegaly with mild  pulmonary vascular congestion. Electronically Signed   By: Ashley Royalty M.D.   On: 01/17/2017 01:28     Assessment and Recommendation  56 y.o. male with the known coronary artery disease peripheral vascular disease diabetes with complication essential hypertension status post previous myocardial infarction PCI and stent placement LAD obtuse margin and PDA with non-ST elevation myocardial infarction consistent with probable inferior myocardial infarction improving with medication management 1. Continue medication management including heparin for 24 more hours 2. Continue carvedilol and isosorbide for myocardial infarction and risk reduction 3. Dual antiplatelet therapy for myocardial infarction and previous PCI and stent placement 4. No further cardiac diagnostics necessary at this time and will not perform cardiac catheterization due to severe stage IV chronic kidney disease which would be exacerbated by heart catheterization 5. Begin ambulation and follow for further significant symptoms requiring adjustments of medications 6. Intensity cholesterol therapy with atorvastatin  Signed, Serafina Royals M.D. FACC

## 2017-01-18 NOTE — Care Management (Signed)
patient admitted with congestive heart failure.  Patient does not weigh self daily and does not restrict his fluid intake.  Nephrology is recommending patient start dialysis.  PPD placed and Hepatitis screens ordered.  Notified Elvera Bicker with [employer]

## 2017-01-18 NOTE — Progress Notes (Signed)
HD STARTED  

## 2017-01-18 NOTE — Op Note (Signed)
OPERATIVE NOTE    PRE-OPERATIVE DIAGNOSIS: 1. ESRD 2. CHF  POST-OPERATIVE DIAGNOSIS: same as above  PROCEDURE: 1. Ultrasound guidance for vascular access to the right internal jugular vein 2. Fluoroscopic guidance for placement of catheter 3. Placement of a 19 cm tip to cuff tunneled hemodialysis catheter via the right internal jugular vein  SURGEON: Leotis Pain, MD  ANESTHESIA:  Local with Moderate conscious sedation for approximately 15 minutes using 2 mg of Versed and 50 mcg of Fentanyl  ESTIMATED BLOOD LOSS: 20 cc  FLUORO TIME: less than one minute  CONTRAST: none  FINDING(S): 1.  Patent right internal jugular vein  SPECIMEN(S):  None  INDICATIONS:   Scott [name] is a 56 y.o.male who presents with SOB and heart failure.  He has known CKD, and is now felt to be ESRD.  The patient needs long term dialysis access for their ESRD, and a Permcath is necessary.  Risks and benefits are discussed and informed consent is obtained.    DESCRIPTION: After obtaining full informed written consent, the patient was brought back to the vascular suited. The patient's right neck and chest were sterilely prepped and draped in a sterile surgical field was created. Moderate conscious sedation was administered during a face to face encounter with the patient throughout the procedure with my supervision of the RN administering medicines and monitoring the patient's vital signs, pulse oximetry, telemetry and mental status throughout from the start of the procedure until the patient was taken to the recovery room.  The right internal jugular vein was visualized with ultrasound and found to be patent. It was then accessed under direct ultrasound guidance and a permanent image was recorded. A wire was placed. After skin nick and dilatation, the peel-away sheath was placed over the wire. I then turned my attention to an area under the clavicle. Approximately 1-2 fingerbreadths below the clavicle a small  counterincision was created and tunneled from the subclavicular incision to the access site. Using fluoroscopic guidance, a 19 centimeter tip to cuff tunneled hemodialysis catheter was selected, and tunneled from the subclavicular incision to the access site. It was then placed through the peel-away sheath and the peel-away sheath was removed. Using fluoroscopic guidance the catheter tips were parked in the right atrium. The appropriate distal connectors were placed. It withdrew blood well and flushed easily with heparinized saline and a concentrated heparin solution was then placed. It was secured to the chest wall with 2 Prolene sutures. The access incision was closed single 4-0 Monocryl. A 4-0 Monocryl pursestring suture was placed around the exit site. Sterile dressings were placed. The patient tolerated the procedure well and was taken to the recovery room in stable condition.  COMPLICATIONS: None  CONDITION: Stable  Leotis Pain, MD 01/18/2017 6:22 PM   This note was created with Dragon Medical transcription system. Any errors in dictation are purely unintentional.

## 2017-01-18 NOTE — Progress Notes (Signed)
PRE DIALYSIS ASSESSMENT 

## 2017-01-18 NOTE — Progress Notes (Signed)
[facility]  ROUNDING NOTE   Subjective:   [name] admitted on 01/17/2017 for Acute on chronic congestive heart failure, unspecified heart failure type ([place]) [I50.9]   Complains of diabetic gastroparesis.   He has been unsuccessful with fluid restriction and low potassium diet.   Objective:  Vital signs in last 24 hours:  Temp:  [97.8 F (36.6 C)-98.3 F (36.8 C)] 98.2 F (36.8 C) (05/21 0452) Pulse Rate:  [69-73] 71 (05/21 0452) Resp:  [13-18] 18 (05/21 0452) BP: (135-162)/(69-92) 136/69 (05/21 0452) SpO2:  [94 %-99 %] 95 % (05/21 0452) Weight:  [109.6 kg (241 lb 9.6 oz)] 109.6 kg (241 lb 9.6 oz) (05/21 0452)  Weight change: -1.996 kg (-4 lb 6.4 oz) Filed Weights   01/17/17 0057 01/17/17 0448 01/18/17 0452  Weight: 111.6 kg (246 lb) 111.5 kg (245 lb 13 oz) 109.6 kg (241 lb 9.6 oz)    Intake/Output: I/O last 3 completed shifts: In: 200 [P.O.:200] Out: 950 [Urine:950]   Intake/Output this shift:  Total I/O In: 240 [P.O.:240] Out: 0   Physical Exam: General: NAD,   Head: Normocephalic, atraumatic. Moist oral mucosal membranes  Eyes: Anicteric, PERRL  Neck: Supple, trachea midline  Lungs:  Clear to auscultation  Heart: Regular rate and rhythm  Abdomen:  Soft, nontender,   Extremities:  no peripheral edema.  Neurologic: Nonfocal, moving all four extremities  Skin: No lesions       Basic Metabolic Panel:  Recent Labs Lab 01/17/17 0050 01/18/17 0518  NA 143 143  K 4.6 4.4  CL 116* 117*  CO2 21* 21*  GLUCOSE 97 131*  BUN 41* 47*  CREATININE 5.75* 6.33*  CALCIUM 7.3* 7.2*    Liver Function Tests:  Recent Labs Lab 01/17/17 0050  AST 23  ALT 19  ALKPHOS 99  BILITOT 0.4  PROT 6.4*  ALBUMIN 2.9*   No results for input(s): LIPASE, AMYLASE in the last 168 hours. No results for input(s): AMMONIA in the last 168 hours.  CBC:  Recent Labs Lab 01/17/17 0050 01/18/17 0518  WBC 14.4* 8.3  HGB 10.1* 8.4*  HCT 30.1* 25.6*   MCV 91.1 90.9  PLT 315 281    Cardiac Enzymes:  Recent Labs Lab 01/17/17 0050 01/17/17 0732 01/17/17 1331 01/17/17 1942  TROPONINI 0.03* 0.87* 1.46* 1.20*    BNP: Invalid input(s): POCBNP  CBG:  Recent Labs Lab 01/17/17 0739 01/17/17 1154 01/17/17 1702 01/17/17 2107 01/18/17 0731  GLUCAP 97 103* 100* 134* 120*    Microbiology: Results for orders placed or performed during the hospital encounter of 01/17/17  MRSA PCR Screening     Status: None   Collection Time: 01/17/17  3:56 AM  Result Value Ref Range Status   MRSA by PCR NEGATIVE NEGATIVE Final    Comment:        The GeneXpert MRSA Assay (FDA approved for NASAL specimens only), is one component of a comprehensive MRSA colonization surveillance program. It is not intended to diagnose MRSA infection nor to guide or monitor treatment for MRSA infections.     Coagulation Studies: No results for input(s): LABPROT, INR in the last 72 hours.  Urinalysis: No results for input(s): COLORURINE, LABSPEC, PHURINE, GLUCOSEU, HGBUR, BILIRUBINUR, KETONESUR, PROTEINUR, UROBILINOGEN, NITRITE, LEUKOCYTESUR in the last 72 hours.  Invalid input(s): APPERANCEUR    Imaging: Dg Chest Portable 1 View  Result Date: 01/17/2017 CLINICAL DATA:  Acute onset of respiratory distress EXAM: PORTABLE CHEST 1 VIEW COMPARISON:  11/13/2016 FINDINGS: Stable cardiomegaly.  No aortic aneurysm. Pulmonary venous congestion is stable. No pneumonic consolidation, effusion or pneumothorax. No acute nor suspicious osseous abnormalities. IMPRESSION: Stable cardiomegaly with mild pulmonary vascular congestion. Electronically Signed   By: Ashley Royalty M.D.   On: 01/17/2017 01:28     Medications:    . aspirin EC  81 mg Oral Daily  . atorvastatin  80 mg Oral q1800  . [START ON 01/19/2017] carvedilol  25 mg Oral BID WC  . cholecalciferol  2,000 Units Oral Daily  . docusate sodium  100 mg Oral BID  . heparin  5,000 Units Subcutaneous Q8H  .  insulin aspart  0-20 Units Subcutaneous TID WC  . insulin glargine  42 Units Subcutaneous QHS  . isosorbide-hydrALAZINE  1 tablet Oral TID  . loratadine  10 mg Oral Daily  . metoCLOPramide  5 mg Oral Q1500  . pantoprazole  40 mg Oral Daily  . prasugrel  10 mg Oral Daily  . tuberculin  5 Units Intradermal Once   acetaminophen **OR** acetaminophen, albuterol, alum & mag hydroxide-simeth, labetalol, nitroGLYCERIN, ondansetron **OR** ondansetron (ZOFRAN) IV  Assessment/ Plan:  [name] is a 56 y.o. white male with past medical history of congestive heart failure, GERD, hypertension, peripheral vascular disease, diabetes mellitus type 2, diabetic retinopathy    1. 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  - Consult vascular surgery for dialysis access - Patient agrees to start dialysis.  - Holding lisinopril.  - Check PPD, Check hepatitis panel.  - Plan for first dialysis.   2. Anemia of chronic kidney disease: hemoglobin 8.4  - ESA with HD treatment  3. Hypertension: blood pressure now at goal. Holding diuretics.   4. Diabetes mellitus type II with chronic kidney disease:  - glucose control.   5. Secondary Hyperparathyroidism:  - Check PTH, phosphorus    LOS: 1 Scott [name] 5/21/201810:55 AM

## 2017-01-19 ENCOUNTER — Encounter: Payer: Self-pay | Admitting: Vascular Surgery

## 2017-01-19 LAB — GLUCOSE, CAPILLARY
Glucose-Capillary: 112 mg/dL — ABNORMAL HIGH (ref 65–99)
Glucose-Capillary: 112 mg/dL — ABNORMAL HIGH (ref 65–99)
Glucose-Capillary: 217 mg/dL — ABNORMAL HIGH (ref 65–99)
Glucose-Capillary: 170 mg/dL — ABNORMAL HIGH (ref 65–99)

## 2017-01-19 LAB — BASIC METABOLIC PANEL
Anion gap: 6 (ref 5–15)
BUN: 34 mg/dL — ABNORMAL HIGH (ref 6–20)
CALCIUM: 7.4 mg/dL — AB (ref 8.9–10.3)
CO2: 26 mmol/L (ref 22–32)
CREATININE: 4.82 mg/dL — AB (ref 0.61–1.24)
Chloride: 108 mmol/L (ref 101–111)
GFR calc Af Amer: 14 mL/min — ABNORMAL LOW (ref >60)
GFR calc non Af Amer: 12 mL/min — ABNORMAL LOW (ref >60)
GLUCOSE: 185 mg/dL — AB (ref 65–99)
Potassium: 3.9 mmol/L (ref 3.5–5.1)
Sodium: 140 mmol/L (ref 135–145)

## 2017-01-19 LAB — HEPATITIS B CORE ANTIBODY, IGM: HEP B C IGM: NEGATIVE

## 2017-01-19 LAB — HEPATITIS B SURFACE ANTIGEN: HEP B S AG: NEGATIVE

## 2017-01-19 LAB — HEPATITIS B SURFACE ANTIBODY,QUALITATIVE: HEP B S AB: NONREACTIVE

## 2017-01-19 MED ORDER — MORPHINE SULFATE (PF) 2 MG/ML IV SOLN
2.0000 mg | INTRAVENOUS | Status: DC | PRN
  Administered 2017-01-19: 2 mg via INTRAVENOUS
  Filled 2017-01-19: qty 1

## 2017-01-19 MED ORDER — LISINOPRIL 20 MG PO TABS
20.0000 mg | ORAL_TABLET | Freq: Every day | ORAL | Status: DC
  Administered 2017-01-19: 20 mg via ORAL
  Filled 2017-01-19: qty 1

## 2017-01-19 NOTE — Care Management (Signed)
Updated Scott [name] with [facility] [employer] that if tolerates thrid treatment tomorrow, patient will be medically stable for discharge.  PPD will have to be read.   Results for Scott [name], Scott [name] (MRN [id number]) as of 01/19/2017 12:09  Ref. Range 01/17/2017 07:32 01/18/2017 05:18  Hepatitis B Surface Ag Latest Ref Range: Negative  Negative   Hep B S Ab Unknown  Non Reactive  Hep B Core Ab, IgM Latest Ref Range: Negative   Negative

## 2017-01-19 NOTE — Progress Notes (Signed)
Post hd assessment 

## 2017-01-19 NOTE — Progress Notes (Signed)
Pre hd info 

## 2017-01-19 NOTE — Progress Notes (Signed)
Post hd vitals 

## 2017-01-19 NOTE — Progress Notes (Signed)
[facility]  ROUNDING NOTE   Subjective:   Seen and examined on second treatment of hemodialysis. Tolerated first treatment well. Tolerating treatment well.  Permcath placed yesterday.   Objective:  Vital signs in last 24 hours:  Temp:  [97.9 F (36.6 C)-98.3 F (36.8 C)] 98 F (36.7 C) (05/22 1031) Pulse Rate:  [62-73] 64 (05/22 1140) Resp:  [11-22] 13 (05/22 1140) BP: (145-206)/(69-105) 171/82 (05/22 1140) SpO2:  [95 %-99 %] 98 % (05/22 1140) Weight:  [107.4 kg (236 lb 12.4 oz)-111 kg (244 lb 11.4 oz)] 107.4 kg (236 lb 12.4 oz) (05/22 1031)  Weight change: 1.411 kg (3 lb 1.8 oz) Filed Weights   01/18/17 2221 01/19/17 0328 01/19/17 1031  Weight: 109.4 kg (241 lb 3.2 oz) 109.2 kg (240 lb 12.8 oz) 107.4 kg (236 lb 12.4 oz)    Intake/Output: I/O last 3 completed shifts: In: 600 [P.O.:600] Out: 1550 [Urine:1550]   Intake/Output this shift:  Total I/O In: 240 [P.O.:240] Out: 800 [Urine:800]  Physical Exam: General: NAD,   Head: Normocephalic, atraumatic. Moist oral mucosal membranes  Eyes: Anicteric, PERRL  Neck: Supple, trachea midline  Lungs:  Clear to auscultation  Heart: Regular rate and rhythm  Abdomen:  Soft, nontender,   Extremities:  no peripheral edema.  Neurologic: Nonfocal, moving all four extremities  Skin: No lesions       Basic Metabolic Panel:  Recent Labs Lab 01/17/17 0050 01/18/17 0518 01/18/17 1914 01/19/17 0346  NA 143 143  --  140  K 4.6 4.4  --  3.9  CL 116* 117*  --  108  CO2 21* 21*  --  26  GLUCOSE 97 131*  --  185*  BUN 41* 47*  --  34*  CREATININE 5.75* 6.33*  --  4.82*  CALCIUM 7.3* 7.2*  --  7.4*  PHOS  --   --  4.9*  --     Liver Function Tests:  Recent Labs Lab 01/17/17 0050  AST 23  ALT 19  ALKPHOS 99  BILITOT 0.4  PROT 6.4*  ALBUMIN 2.9*   No results for input(s): LIPASE, AMYLASE in the last 168 hours. No results for input(s): AMMONIA in the last 168 hours.  CBC:  Recent Labs Lab  01/17/17 0050 01/18/17 0518 01/18/17 1914  WBC 14.4* 8.3 3.7*  HGB 10.1* 8.4* 8.5*  HCT 30.1* 25.6* 24.8*  MCV 91.1 90.9 90.6  PLT 315 281 266    Cardiac Enzymes:  Recent Labs Lab 01/17/17 0050 01/17/17 0732 01/17/17 1331 01/17/17 1942  TROPONINI 0.03* 0.87* 1.46* 1.20*    BNP: Invalid input(s): POCBNP  CBG:  Recent Labs Lab 01/18/17 0731 01/18/17 1142 01/18/17 1630 01/18/17 2221 01/19/17 0758  GLUCAP 120* 143* 121* 66 112*    Microbiology: Results for orders placed or performed during the hospital encounter of 01/17/17  MRSA PCR Screening     Status: None   Collection Time: 01/17/17  3:56 AM  Result Value Ref Range Status   MRSA by PCR NEGATIVE NEGATIVE Final    Comment:        The GeneXpert MRSA Assay (FDA approved for NASAL specimens only), is one component of a comprehensive MRSA colonization surveillance program. It is not intended to diagnose MRSA infection nor to guide or monitor treatment for MRSA infections.     Coagulation Studies: No results for input(s): LABPROT, INR in the last 72 hours.  Urinalysis: No results for input(s): COLORURINE, LABSPEC, PHURINE, GLUCOSEU, HGBUR, BILIRUBINUR, KETONESUR, PROTEINUR, UROBILINOGEN, NITRITE,  LEUKOCYTESUR in the last 72 hours.  Invalid input(s): APPERANCEUR    Imaging: No results found.   Medications:    . aspirin EC  81 mg Oral Daily  . atorvastatin  80 mg Oral q1800  . carvedilol  25 mg Oral BID WC  . cholecalciferol  2,000 Units Oral Daily  . docusate sodium  100 mg Oral BID  . heparin  5,000 Units Subcutaneous Q8H  . insulin aspart  0-20 Units Subcutaneous TID WC  . insulin glargine  42 Units Subcutaneous QHS  . isosorbide-hydrALAZINE  1 tablet Oral TID  . loratadine  10 mg Oral Daily  . metoCLOPramide  5 mg Oral Q1500  . pantoprazole  40 mg Oral Daily  . prasugrel  10 mg Oral Daily  . sodium chloride flush  3 mL Intravenous Q12H  . tuberculin  5 Units Intradermal Once    acetaminophen **OR** acetaminophen, albuterol, alum & mag hydroxide-simeth, labetalol, morphine injection, nitroGLYCERIN, ondansetron **OR** ondansetron (ZOFRAN) IV  Assessment/ Plan:  [name] is a 56 y.o. white male with past medical history of congestive heart failure, GERD, hypertension, peripheral vascular disease, diabetes mellitus type 2, diabetic retinopathy    1. End Stage Renal Disease: started hemodialysis on 5/21 Right IJ permcath.  Second treatment today. Tolerating treatment well. Third treatment for tomorrow.  - Discharge planning for [facility] [place]: TTS  - PPD to be read tomorrow.  - will restart lisinopril.    2. Anemia of chronic kidney disease: hemoglobin 8.4  - EPO with next treatment  3. Hypertension: blood pressure elevated - restart home lisinopril  4. Diabetes mellitus type II with chronic kidney disease:  - glucose control.   5. Secondary Hyperparathyroidism: with hypocalcemia. Phosphorus at goal, 4.9. PTH pending.    LOS: 2 Scott [name] 5/22/201812:09 PM

## 2017-01-19 NOTE — Care Management Important Message (Signed)
Important Message  Patient Details  Name: Scott [name] MRN: [id number] Date of Birth: 04-08-1961   Medicare Important Message Given:  Yes  Signed IM notice given     Katrina Stack, RN 01/19/2017, 12:07 PM

## 2017-01-19 NOTE — Progress Notes (Signed)
End of hd tx  

## 2017-01-19 NOTE — Progress Notes (Signed)
[place] at [place] NAME: Scott [name]    MR#:  [id number]  DATE OF BIRTH:  June 29, 1961  SUBJECTIVE:  CHIEF COMPLAINT:   Chief Complaint  Patient presents with  . Respiratory Distress   -Had a permacath placed yesterday and first session of dialysis done. -Complains of some soreness around the catheter site. For second day of dialysis today  REVIEW OF SYSTEMS:  Review of Systems  Constitutional: Negative for chills, fever and malaise/fatigue.  HENT: Negative for congestion, ear discharge, hearing loss and nosebleeds.   Eyes: Negative for blurred vision and double vision.  Respiratory: Negative for cough, shortness of breath and wheezing.   Cardiovascular: Negative for chest pain, palpitations and leg swelling.  Gastrointestinal: Negative for abdominal pain, constipation, diarrhea, nausea and vomiting.  Genitourinary: Negative for dysuria.  Musculoskeletal: Negative for myalgias.  Neurological: Negative for dizziness, speech change, focal weakness, seizures and headaches.  Psychiatric/Behavioral: Negative for depression and suicidal ideas.    DRUG ALLERGIES:  No Known Allergies  VITALS:  Blood pressure (!) 174/88, pulse 66, temperature 98 F (36.7 C), temperature source Oral, resp. rate 14, height 5\' 10"  (1.778 m), weight 107.4 kg (236 lb 12.4 oz), SpO2 97 %.  PHYSICAL EXAMINATION:  Physical Exam  GENERAL:  56 y.o.-year-old  Overweight patient lying in the bed with no acute distress.  EYES: Pupils equal, round, reactive to light and accommodation. No scleral icterus. Extraocular muscles intact.  HEENT: Head atraumatic, normocephalic. Oropharynx and nasopharynx clear.  NECK:  Supple, no jugular venous distention. No thyroid enlargement, no tenderness.  LUNGS: Normal breath sounds bilaterally, no wheezing, rales,rhonchi or crepitation. No use of accessory muscles of respiration. Decreased bibasilar breath sounds CARDIOVASCULAR:  S1, S2 normal. No murmurs, rubs, or gallops. Right chest permacath in place ABDOMEN: Soft, nontender, nondistended. Bowel sounds present. No organomegaly or mass.  EXTREMITIES: No pedal edema, cyanosis, or clubbing.  NEUROLOGIC: Cranial nerves II through XII are intact. Muscle strength 5/5 in all extremities. Sensation intact. Gait not checked.  PSYCHIATRIC: The patient is alert and oriented x 3.  SKIN: No obvious rash, lesion, or ulcer.    LABORATORY PANEL:   CBC  Recent Labs Lab 01/18/17 1914  WBC 3.7*  HGB 8.5*  HCT 24.8*  PLT 266   ------------------------------------------------------------------------------------------------------------------  Chemistries   Recent Labs Lab 01/17/17 0050  01/19/17 0346  NA 143  < > 140  K 4.6  < > 3.9  CL 116*  < > 108  CO2 21*  < > 26  GLUCOSE 97  < > 185*  BUN 41*  < > 34*  CREATININE 5.75*  < > 4.82*  CALCIUM 7.3*  < > 7.4*  AST 23  --   --   ALT 19  --   --   ALKPHOS 99  --   --   BILITOT 0.4  --   --   < > = values in this interval not displayed. ------------------------------------------------------------------------------------------------------------------  Cardiac Enzymes  Recent Labs Lab 01/17/17 1942  TROPONINI 1.20*   ------------------------------------------------------------------------------------------------------------------  RADIOLOGY:  No results found.  EKG:   Orders placed or performed during the hospital encounter of 01/17/17  . ED EKG within 10 minutes  . ED EKG within 10 minutes  . EKG 12-Lead  . EKG 12-Lead    ASSESSMENT AND PLAN:   56 year old male with past medical history significant for diastolic congestive heart failure, CK D stage IV, CAD, depression, hyperlipidemia results to hospital secondary  to difficulty breathing and noted to have hypoxia.  #1 acute hypoxic respiratory failure-secondary to acute on chronic diastolic CHF exacerbation. -Recent echocardiogram with normal  ejection fraction. Required BiPAP and admission. Much improved breathing. - off oxygen now -no lasix now as initiated on dialysis  #2 CKD stage IV, now progressed to ESRD-patient's previous baseline creatinine used to be around 2.7, his creatinine worsened up to 6 this admission -[place] nephrology consultation. Started on hemodialysis on 01/18/2017. Day 2 today. -Third session tomorrow and set up outpatient dialysis prior to discharge.  #3 diabetes mellitus-currently on Lantus and sliding scale insulin.  #4 elevated troponin- demand ischemia, no chest pain - but previously positive stress test with inf wall defect- appreciate cardiology consult - due to his underlying renal disease- cath might not be feasible at this time, especially as troponin trending down and also patient asymptomatic - so continue aspirin, Effient, Coreg, Imdur and statin Encourage ambulation -Outpatient follow-up with cardiology  #5 DVT prophylaxis-on subcutaneous heparin     All the records are reviewed and case discussed with Care Management/Social Workerr. Management plans discussed with the patient, family and they are in agreement.  CODE STATUS: Full code  TOTAL TIME TAKING CARE OF THIS PATIENT: 38 minutes.   POSSIBLE D/C IN 1-2 DAYS, DEPENDING ON CLINICAL CONDITION.   Feleshia Zundel M.D on 01/19/2017 at 1:12 PM  Between 7am to 6pm - Pager - [phone]  After 6pm go to [email or web address] - password [email or web address] [place] [facility]  Office  [phone]  CC: Primary care physician; [employer]

## 2017-01-19 NOTE — Progress Notes (Signed)
Hd start 

## 2017-01-19 NOTE — Progress Notes (Signed)
Pre hd assessment  

## 2017-01-20 LAB — BASIC METABOLIC PANEL
Anion gap: 6 (ref 5–15)
BUN: 33 mg/dL — AB (ref 6–20)
CALCIUM: 7.7 mg/dL — AB (ref 8.9–10.3)
CO2: 30 mmol/L (ref 22–32)
Chloride: 105 mmol/L (ref 101–111)
Creatinine, Ser: 4.71 mg/dL — ABNORMAL HIGH (ref 0.61–1.24)
GFR calc Af Amer: 14 mL/min — ABNORMAL LOW (ref >60)
GFR, EST NON AFRICAN AMERICAN: 12 mL/min — AB (ref >60)
GLUCOSE: 143 mg/dL — AB (ref 65–99)
Potassium: 4.5 mmol/L (ref 3.5–5.1)
Sodium: 141 mmol/L (ref 135–145)

## 2017-01-20 LAB — HEPATITIS B SURFACE ANTIBODY, QUANTITATIVE

## 2017-01-20 LAB — CBC
HCT: 26 % — ABNORMAL LOW (ref 40.0–52.0)
Hemoglobin: 8.7 g/dL — ABNORMAL LOW (ref 13.0–18.0)
MCH: 30.2 pg (ref 26.0–34.0)
MCHC: 33.6 g/dL (ref 32.0–36.0)
MCV: 89.9 fL (ref 80.0–100.0)
PLATELETS: 275 10*3/uL (ref 150–440)
RBC: 2.89 MIL/uL — ABNORMAL LOW (ref 4.40–5.90)
RDW: 15.1 % — AB (ref 11.5–14.5)
WBC: 9.2 10*3/uL (ref 3.8–10.6)

## 2017-01-20 LAB — PARATHYROID HORMONE, INTACT (NO CA): PTH: 274 pg/mL — ABNORMAL HIGH (ref 15–65)

## 2017-01-20 LAB — GLUCOSE, CAPILLARY: Glucose-Capillary: 134 mg/dL — ABNORMAL HIGH (ref 65–99)

## 2017-01-20 LAB — HEPATITIS B SURFACE ANTIGEN: Hepatitis B Surface Ag: NEGATIVE

## 2017-01-20 NOTE — Progress Notes (Signed)
[facility]  ROUNDING NOTE   Subjective:   Seen and examined on Third treatment of hemodialysis.   Tolerating treatment well.   Mother at bedside.   Objective:  Vital signs in last 24 hours:  Temp:  [97.9 F (36.6 C)-98.2 F (36.8 C)] 98.2 F (36.8 C) (05/23 1433) Pulse Rate:  [65-69] 69 (05/23 1433) Resp:  [13-19] 18 (05/23 1433) BP: (101-195)/(60-104) 187/89 (05/23 1433) SpO2:  [94 %-99 %] 97 % (05/23 1433) Weight:  [106.1 kg (233 lb 14.5 oz)-108.7 kg (239 lb 11.2 oz)] 106.1 kg (233 lb 14.5 oz) (05/23 1405)  Weight change: -3.6 kg (-7 lb 15 oz) Filed Weights   01/20/17 0457 01/20/17 1050 01/20/17 1405  Weight: 108.7 kg (239 lb 11.2 oz) 107.1 kg (236 lb 1.8 oz) 106.1 kg (233 lb 14.5 oz)    Intake/Output: I/O last 3 completed shifts: In: 840 [P.O.:840] Out: 2832 [Urine:2450; Other:382]   Intake/Output this shift:  Total I/O In: 240 [P.O.:240] Out: 500 [Other:500]  Physical Exam: General: NAD,   Head: Normocephalic, atraumatic. Moist oral mucosal membranes  Eyes: Anicteric, PERRL  Neck: Supple, trachea midline  Lungs:  Clear to auscultation  Heart: Regular rate and rhythm  Abdomen:  Soft, nontender,   Extremities:  no peripheral edema.  Neurologic: Nonfocal, moving all four extremities  Skin: No lesions       Basic Metabolic Panel:  Recent Labs Lab 01/17/17 0050 01/18/17 0518 01/18/17 1914 01/19/17 0346 01/20/17 0554  NA 143 143  --  140 141  K 4.6 4.4  --  3.9 4.5  CL 116* 117*  --  108 105  CO2 21* 21*  --  26 30  GLUCOSE 97 131*  --  185* 143*  BUN 41* 47*  --  34* 33*  CREATININE 5.75* 6.33*  --  4.82* 4.71*  CALCIUM 7.3* 7.2*  --  7.4* 7.7*  PHOS  --   --  4.9*  --   --     Liver Function Tests:  Recent Labs Lab 01/17/17 0050  AST 23  ALT 19  ALKPHOS 99  BILITOT 0.4  PROT 6.4*  ALBUMIN 2.9*   No results for input(s): LIPASE, AMYLASE in the last 168 hours. No results for input(s): AMMONIA in the last 168  hours.  CBC:  Recent Labs Lab 01/17/17 0050 01/18/17 0518 01/18/17 1914 01/20/17 1030  WBC 14.4* 8.3 3.7* 9.2  HGB 10.1* 8.4* 8.5* 8.7*  HCT 30.1* 25.6* 24.8* 26.0*  MCV 91.1 90.9 90.6 89.9  PLT 315 281 266 275    Cardiac Enzymes:  Recent Labs Lab 01/17/17 0050 01/17/17 0732 01/17/17 1331 01/17/17 1942  TROPONINI 0.03* 0.87* 1.46* 1.20*    BNP: Invalid input(s): POCBNP  CBG:  Recent Labs Lab 01/19/17 0758 01/19/17 1337 01/19/17 1705 01/19/17 2037 01/20/17 0727  GLUCAP 112* 112* 217* 170* 134*    Microbiology: Results for orders placed or performed during the hospital encounter of 01/17/17  MRSA PCR Screening     Status: None   Collection Time: 01/17/17  3:56 AM  Result Value Ref Range Status   MRSA by PCR NEGATIVE NEGATIVE Final    Comment:        The GeneXpert MRSA Assay (FDA approved for NASAL specimens only), is one component of a comprehensive MRSA colonization surveillance program. It is not intended to diagnose MRSA infection nor to guide or monitor treatment for MRSA infections.     Coagulation Studies: No results for input(s): LABPROT, INR in  the last 72 hours.  Urinalysis: No results for input(s): COLORURINE, LABSPEC, PHURINE, GLUCOSEU, HGBUR, BILIRUBINUR, KETONESUR, PROTEINUR, UROBILINOGEN, NITRITE, LEUKOCYTESUR in the last 72 hours.  Invalid input(s): APPERANCEUR    Imaging: No results found.   Medications:    . aspirin EC  81 mg Oral Daily  . atorvastatin  80 mg Oral q1800  . carvedilol  25 mg Oral BID WC  . cholecalciferol  2,000 Units Oral Daily  . docusate sodium  100 mg Oral BID  . heparin  5,000 Units Subcutaneous Q8H  . insulin aspart  0-20 Units Subcutaneous TID WC  . insulin glargine  42 Units Subcutaneous QHS  . isosorbide-hydrALAZINE  1 tablet Oral TID  . lisinopril  20 mg Oral Daily  . loratadine  10 mg Oral Daily  . metoCLOPramide  5 mg Oral Q1500  . pantoprazole  40 mg Oral Daily  . prasugrel  10 mg  Oral Daily  . sodium chloride flush  3 mL Intravenous Q12H   acetaminophen **OR** acetaminophen, albuterol, alum & mag hydroxide-simeth, labetalol, morphine injection, nitroGLYCERIN, ondansetron **OR** ondansetron (ZOFRAN) IV  Assessment/ Plan:  Mr. CHAYNCE SCHAFER is a 56 y.o. white male with past medical history of congestive heart failure, GERD, hypertension, peripheral vascular disease, diabetes mellitus type 2, diabetic retinopathy    1. End Stage Renal Disease: started hemodialysis on 5/21 Right IJ permcath.  Third treatment today. Tolerating treatment well. Third treatment for tomorrow.  - Discharge planning for [facility]: TTS   2. Anemia of chronic kidney disease: hemoglobin 8.4  - EPO with next treatment  3. Hypertension: blood pressure elevated - restarted home lisinopril  4. Diabetes mellitus type II with chronic kidney disease:  - glucose control.   5. Secondary Hyperparathyroidism: with hypocalcemia. Phosphorus at goal, 4.9. PTH 274 - start calcitriol as outpatient.     LOS: 3 Shabazz Mckey 5/23/20182:55 PM

## 2017-01-20 NOTE — Discharge Instructions (Signed)
Heart Failure Clinic appointment on Dialysis Dialysis is a procedure that replaces some of the work healthy kidneys do. It is done when you lose about 85-90% of your kidney function. It may also be done earlier if your symptoms may be improved by dialysis. During dialysis, wastes, salt, and extra water are removed from the blood, and the levels of certain chemicals in the blood (such as potassium) are maintained. Dialysis is done in sessions. Dialysis sessions are continued until the kidneys get better. If the kidneys cannot get better, such as in end-stage kidney disease, dialysis is continued for life or until you receive a new kidney (kidney transplant). There are two types of dialysis: hemodialysis and peritoneal dialysis. What is hemodialysis? Hemodialysis is a type of dialysis in which a machine called a dialyzer is used to filter the blood. Before beginning hemodialysis, you will have surgery to create a site where blood can be removed from the body and returned to the body (vascular access). There are three types of vascular accesses:  Arteriovenous fistula. To create this type of access, an artery is connected to a vein (usually in the arm). A fistula takes 1-6 months to develop after surgery. If it develops properly, it usually lasts longer than the other types of vascular accesses. It is also less likely to become infected and cause blood clots.  Arteriovenous graft. To create this type of access, an artery and a vein in the arm are connected with a tube. A graft may be used within 2-3 weeks of surgery.  A venous catheter. To create this type of access, a thin, flexible tube (catheter) is placed in a large vein in your neck, chest, or groin. A catheter may be used right away. It is usually used as a temporary access when dialysis needs to begin immediately. During hemodialysis, blood leaves the body through your access. It travels through a tube to the dialyzer, where it is filtered. The blood  then returns to your body through another tube. Hemodialysis is usually performed by a health care provider at a hospital or dialysis center three times a week. Visits last about 3-4 hours. It may also be performed with the help of another person at home with training. What is peritoneal dialysis? Peritoneal dialysis is a type of dialysis in which the thin lining of the abdomen (peritoneum) is used as a filter. Before beginning peritoneal dialysis, you will have surgery to place a catheter in your abdomen. The catheter will be used to transfer a fluid called dialysate to and from your abdomen. At the start of a session, your abdomen is filled with dialysate. During the session, wastes, salt, and extra water in the blood pass through the peritoneum and into the dialysate. The dialysate is drained from the body at the end of the session. The process of filling and draining the dialysate is called an exchange. Exchanges are repeated until you have used up all the dialysate for the day. Peritoneal dialysis may be performed by you at home or at almost any other location. It is done every day. You may need up to five exchanges a day. The amount of time the dialysate is in your body between exchanges is called a dwell. The dwell depends on the number of exchanges needed and the characteristics of the peritoneum. It usually varies from 1.5-3 hours. You may go about your day normally between exchanges. Alternately, the exchanges may be done at night while you sleep, using a machine called a  cycler. Which type of dialysis should I choose? Both hemodialysis and peritoneal dialysis have advantages and disadvantages. Talk to your health care provider about which type of dialysis would be best for you. Your lifestyle and preferences should be considered along with your medical condition. In some cases, only one type of dialysis may be an option. Advantages of hemodialysis  It is done less often than peritoneal  dialysis.  Someone else can do the dialysis for you.  If you go to a dialysis center, your health care provider will be able to recognize any problems right away.  If you go to a dialysis center, you can interact with others who are having dialysis. This can provide you with emotional support. Disadvantages of hemodialysis  Hemodialysis may cause cramps and low blood pressure. It may leave you feeling tired on the days you have the treatment.  If you go to a dialysis center, you will need to make weekly appointments and work around the centers schedule.  You will need to take extra care when traveling. If you go to a dialysis center, you will need to make special arrangements to visit a dialysis center near your destination. If you are having treatments at home, you will need to take the dialyzer with you to your destination.  You will need to avoid more foods than you would need to avoid on peritoneal dialysis. Advantages of peritoneal dialysis  It is less likely than hemodialysis to cause cramps and low blood pressure.  You may do exchanges on your own wherever you are, including when you travel.  You do not need to avoid as many foods as you do on hemodialysis. Disadvantages of peritoneal dialysis  It is done more often than hemodialysis.  Performing peritoneal dialysis requires you to have dexterity of the hands. You must also be able to lift bags.  You will have to learn sterilization techniques. You will need to practice them every day to reduce the risk of infection. What changes will I need to make to my diet during dialysis? Both hemodialysis and peritoneal dialysis require you to make some changes to your diet. For example, you will need to limit your intake of foods high in the minerals phosphorus and potassium. You will also need to limit your fluid intake. Your dietitian can help you plan meals. A good meal plan can improve your dialysis and your health. What should I  expect when beginning dialysis? Adjusting to the dialysis treatment, schedule, and diet can take some time. You may need to stop working and may not be able to do some of the things you normally do. You may feel anxious or depressed when beginning dialysis. Eventually, many people feel better overall because of dialysis. Some people are able to return to work after making some changes, such as reducing work intensity. Where can I find more information?  [place]: www.kidney.org  American Association of Kidney Patients: BombTimer.gl  [employer]: www.kidneyfund.org This information is not intended to replace advice given to you by your health care provider. Make sure you discuss any questions you have with your health care provider. Document Released: 11/07/2002 Document Revised: 01/23/2016 Document Reviewed: 10/11/2012 Elsevier Interactive Patient Education  2017 [place].  Dialysis Diet Dialysis is a treatment that cleans your blood. It is used when the kidneys are damaged. When you need dialysis, you should watch your diet. This is because some nutrients can build up in your blood between treatments and make you sick.  These nutrients are:  Potassium.  Phosphorus.  Sodium. Your doctor or dietitian will:  Tell you how much of these you can have.  Tell you if you need to look out for other nutrients too.  Help you plan meals.  Tell you how much to drink each day. What do I need to know about this diet?  Limit potassium. Potassium is in milk, fruits, and vegetables.  Limit phosphorus. Phosphorus is in milk, cheese, beans, nuts, and carbonated beverages.  Limit salt (sodium). Foods that have a lot sodium include processed and cured meats, ready-made frozen meals, canned vegetables, and salty snack foods.  Do not use salt substitutes.  Try not to eat whole-grain foods and foods that have a lot of fiber.  Follow your doctor's instructions about how  much to drink. You may be told to:  Write down what you drink.  Write down foods you eat that are made mostly from water, such as gelatin and soups.  Drink from small cups.  Ask your doctor if you should take a medicine that binds phosphorus.  Take vitamin and mineral supplements only as told by your doctor.  Eat meat, poultry, fish, and eggs. Limit nuts and beans.  Before you cook potatoes, cut them into small pieces. Then boil them in unsalted water.  Drain all fluid from cooked vegetables and canned fruits before you eat them. What foods can I eat? Grains  White bread. White rice. Cooked cereal. Unsalted popcorn. Tortillas. Pasta. Vegetables  Fresh or frozen broccoli, carrots, and green beans. Cabbage. Cauliflower. Celery. Cucumbers. Eggplant. Radishes. Zucchini. Fruits  Apples. Fresh or frozen berries. Fresh or canned pears, peaches, and pineapple. Grapes. Plums. Meats and Other Protein Sources  Fresh or frozen beef, pork, chicken, and fish. Eggs. Low-sodium canned tuna or salmon. Dairy  Cream cheese. Heavy cream. Ricotta cheese. Beverages  Apple cider. Cranberry juice. Grape juice. Lemonade. Black coffee. Condiments  Herbs. Spices. Jam and jelly. Honey. Sweets and Desserts  [place]. Cakes. Cookies. Fats and Oils  Olive oil, canola oil, and safflower oil. Other  Non-dairy creamer. Non-dairy whipped topping. Homemade broth without salt. The items listed above may not be a complete list of recommended foods or beverages. Contact your dietitian for more options.  What foods are not recommended? Grains  Whole-grain bread. Whole-grain pasta. High-fiber cereal. Vegetables  Potatoes. Beets. Tomatoes. Winter squash and pumpkin. Asparagus. Spinach. Parsnips. Fruits  Star fruit. Bananas. Oranges. Kiwi. Nectarines. Prunes. Melon. Dried fruit. Avocado. Meats and Other Protein Sources  Canned, smoked, and cured meats. Soil scientist. Sardines. Nuts and seeds. Peanut  butter. Beans and legumes. Dairy  Milk. Buttermilk. Yogurt. Cheese and cottage cheese. Processed cheese spreads. Beverages  Orange juice. Prune juice. Carbonated soft drinks. Condiments  Salt. Salt substitutes. Soy sauce. Sweets and Desserts  Ice cream. Chocolate. Candied nuts. Fats and Oils  Butter. Margarine. Other  Ready-made frozen meals. Canned soups. The items listed above may not be a complete list of foods and beverages to avoid. Contact your dietitian for more information.  This information is not intended to replace advice given to you by your health care provider. Make sure you discuss any questions you have with your health care provider. Document Released: 02/16/2012 Document Revised: 01/23/2016 Document Reviewed: 03/20/2014 Elsevier Interactive Patient Education  2017 [place].  Hemodialysis, Care After This sheet gives you information about how to care for yourself after your procedure. Your health care provider may also give you more specific instructions. If you have problems  or questions, contact your health care provider. What can I expect after the procedure? After the procedure, it is common to have:  Fatigue. Energy levels usually return to normal the day after the procedure.  Itchiness. Your health care provider may be able to prescribe medicine to relieve this.  Achy or jittery legs. You may feel like kicking your legs. This sometimes causes sleeping problems. Follow these instructions at home: Eating and drinking   Talk with your health care provider about how much fluid you can drink. Keep track of your fluid intake to make sure you do not drink more fluid than is allowed. This is important because fluid can build up in your body between dialysis sessions. Built-up fluid affects your blood pressure and can make your heart work harder.  Limit your salt (sodium) intake. This is important because:  Sodium makes you thirsty, which creates a problem since  the amount of fluid you can drink every day is limited.  Sodium affects your blood pressure levels. The more you control the amount of sodium in your diet, the more control you may have over your blood pressure levels.  Limit your intake of the mineral potassium. Potassium levels can rise between dialysis sessions. If they become too high, they can cause a dangerous heart rhythm and even death.  Limit your intake of the mineral phosphorus. Consuming too much phosphorus can cause your bones to lose calcium. This makes them weaker and more likely to break. Consuming too much phosphorus may also make your skin itch.  Eat high-quality proteins that are low in phosphorus. High-quality proteins include those found in fish, poultry, and eggs. Vascular access care   Avoid sleeping on the site where blood is removed from your body and returned to your body (vascular access).  If your vascular access is on your arm:  Do not lift weights or heavy objects with that arm.  Do not wear tight clothing over that arm.  Do not have your blood pressure measured on that arm.  Do not allow needle punctures (such as for taking blood) on that arm.  If you have a catheter, do not open it between dialysis sessions.  If you have a fistula or graft:  Wash it with soap every day and before each dialysis session.  Feel for a vibration over the access site (thrill) every day. A thrill means the access is working. General instructions   Take over-the-counter and prescription medicines only as told by your health care provider. This includes vitamin and mineral supplements. Talk with your health care provider before taking new supplements.  Carry a wallet card, bracelet, or medical identification tag that shows you are a dialysis patient. Always keep it with you. If you are in an accident or have a medical emergency and cannot communicate, this item will inform health care providers about your condition.  Keep  all dialysis visits as told by your health care provider. Dialysis appointments should be scheduled regularly. Do not skip an appointment. Contact a health care provider if:  You have a fever.  You have chills.  Your vascular access feels warm.  You find a pimple on your vascular access.  You have any of the following around the vascular access site:  Swelling.  Redness.  Bleeding or drainage. Get help right away if:  You have a fistula, and pain, numbness, or unusual paleness develops in the hand on the side of your fistula.  You have a fistula or graft and  you do not feel a thrill.  You develop dizziness or weakness that you have not had before.  You develop shortness of breath.  You develop chest pain.  There is pus at the vascular access site.  There is bleeding at the vascular access site that cannot be easily controlled.  You become disoriented or confused.  You develop blurred vision.  You have jerky movements you cannot control (seizure). Summary  After hemodialysis, it is common to have fatigue, itchiness, and achy or jittery legs.  Keep close track of your fluid intake, limit intake of sodium, potassium, and phosphorous, eat high-quality proteins, and take vitamin and mineral supplements as directed by your health care provider.  Take proper care of your vascular access, and contact your health care provider if you have problems with your vascular access, such as warmth, swelling, redness, drainage, or bleeding at the site. This information is not intended to replace advice given to you by your health care provider. Make sure you discuss any questions you have with your health care provider. Document Released: 12/12/2012 Document Revised: 07/30/2016 Document Reviewed: 07/30/2016 Elsevier Interactive Patient Education  2017 [place].  Jan 27, 2017 at 9:40am with Darylene Price, [place]. Please call [phone] to reschedule.

## 2017-01-20 NOTE — Progress Notes (Signed)
PPD    01/20/17 1017  PPD Results  Does patient have an induration at the injection site? No  Induration(mm) 0 mm  Name of Physician Notified Dr. Rolly Salter

## 2017-01-20 NOTE — Care Management (Signed)
Spoke with attending and nephrology.  Patient receiving his third dialysis treatment today. Patient has been assigned a dialysis unit- [facility] in [place]  Actual time is still under consideration.   Nephrology called the clinic and was informed patient could be seen tomorrow.  Updated Elvera Bicker with [employer]

## 2017-01-20 NOTE — Progress Notes (Signed)
[facility] Encounter Note  Patient: Scott [name] / Admit Date: 01/17/2017 / Date of Encounter: 01/20/2017, 8:32 AM   Subjective: Patient feeling much better today after catheter placement and dialysis. No evidence of chest discomfort or shortness of breath with  ambulation. Troponin level peaked at 1.4 consistent with non-ST elevation myocardial infarction Stress test recently showed inferior infarct with mild ischemia inferior wall but normal perfusion to the anterior and posterior wall and current symptoms suggest maybe extension of previous infarct inferior wall and now recovbering   Review of Systems: Positive for: Weakness catheter site pain Negative for: Vision change, hearing change, syncope, dizziness, nausea, vomiting,diarrhea, bloody stool, stomach pain, cough, congestion, diaphoresis, urinary frequency, urinary pain,skin lesions, skin rashes Others previously listed  Objective: Telemetry: Normal sinus rhythm Physical Exam: Blood pressure 126/71, pulse 68, temperature 98 F (36.7 C), temperature source Oral, resp. rate 18, height 5\' 10"  (1.778 m), weight 108.7 kg (239 lb 11.2 oz), SpO2 94 %. Body mass index is 34.39 kg/m. General: Well developed, well nourished, in no acute distress. Head: Normocephalic, atraumatic, sclera non-icteric, no xanthomas, nares are without discharge. Neck: No apparent masses Lungs: Normal respirations with no wheezes, no rhonchi, no rales , no crackles   Heart: Regular rate and rhythm, normal S1 S2, no murmur, no rub, no gallop, PMI is normal size and placement, carotid upstroke normal without bruit, jugular venous pressure normal Abdomen: Soft, non-tender, non-distended with normoactive bowel sounds. No hepatosplenomegaly. Abdominal aorta is normal size without bruit Extremities: No edema, no clubbing, no cyanosis, no ulcers,  Peripheral: 2+ radial, 2+ femoral, 2+ dorsal pedal pulses Neuro: Alert and oriented. Moves all  extremities spontaneously. Psych:  Responds to questions appropriately with a normal affect.   Intake/Output Summary (Last 24 hours) at 01/20/17 0832 Last data filed at 01/20/17 0510  Gross per 24 hour  Intake              840 ml  Output             2032 ml  Net            -1192 ml    Inpatient Medications:  . aspirin EC  81 mg Oral Daily  . atorvastatin  80 mg Oral q1800  . carvedilol  25 mg Oral BID WC  . cholecalciferol  2,000 Units Oral Daily  . docusate sodium  100 mg Oral BID  . heparin  5,000 Units Subcutaneous Q8H  . insulin aspart  0-20 Units Subcutaneous TID WC  . insulin glargine  42 Units Subcutaneous QHS  . isosorbide-hydrALAZINE  1 tablet Oral TID  . lisinopril  20 mg Oral Daily  . loratadine  10 mg Oral Daily  . metoCLOPramide  5 mg Oral Q1500  . pantoprazole  40 mg Oral Daily  . prasugrel  10 mg Oral Daily  . sodium chloride flush  3 mL Intravenous Q12H  . tuberculin  5 Units Intradermal Once   Infusions:   Labs:  Recent Labs  01/18/17 1914 01/19/17 0346 01/20/17 0554  NA  --  140 141  K  --  3.9 4.5  CL  --  108 105  CO2  --  26 30  GLUCOSE  --  185* 143*  BUN  --  34* 33*  CREATININE  --  4.82* 4.71*  CALCIUM  --  7.4* 7.7*  PHOS 4.9*  --   --    No results for input(s): AST, ALT, ALKPHOS, BILITOT, PROT,  ALBUMIN in the last 72 hours.  Recent Labs  01/18/17 0518 01/18/17 1914  WBC 8.3 3.7*  HGB 8.4* 8.5*  HCT 25.6* 24.8*  MCV 90.9 90.6  PLT 281 266    Recent Labs  01/17/17 1331 01/17/17 1942  TROPONINI 1.46* 1.20*   Invalid input(s): POCBNP No results for input(s): HGBA1C in the last 72 hours.   Weights: Filed Weights   01/19/17 0328 01/19/17 1031 01/20/17 0457  Weight: 109.2 kg (240 lb 12.8 oz) 107.4 kg (236 lb 12.4 oz) 108.7 kg (239 lb 11.2 oz)     Radiology/Studies:  Dg Chest Portable 1 View  Result Date: 01/17/2017 CLINICAL DATA:  Acute onset of respiratory distress EXAM: PORTABLE CHEST 1 VIEW COMPARISON:   11/13/2016 FINDINGS: Stable cardiomegaly. No aortic aneurysm. Pulmonary venous congestion is stable. No pneumonic consolidation, effusion or pneumothorax. No acute nor suspicious osseous abnormalities. IMPRESSION: Stable cardiomegaly with mild pulmonary vascular congestion. Electronically Signed   By: Ashley Royalty M.D.   On: 01/17/2017 01:28     Assessment and Recommendation  56 y.o. male with the known coronary artery disease peripheral vascular disease diabetes with complication essential hypertension status post previous myocardial infarction PCI and stent placement LAD obtuse margin and PDA with non-ST elevation myocardial infarction consistent with probable inferior myocardial infarction improving with medication management and tolerateing dialysis and ambulation without sx 1. Continue medication management I ob nstemi 2. Continue carvedilol and isosorbide for myocardial infarction and risk reduction and possible better collateral flow 3. Dual antiplatelet therapy for myocardial infarction and previous PCI and stent placement 4. No further cardiac diagnostics necessary at this time and will not perform cardiac catheterization due to severe stage 4 chronic kidney disease now tolerating dialysis without new cardiac sx 5. Begin ambulation and follow for further significant symptoms requiring adjustments of medications 6. High Intensity cholesterol therapy with atorvastatin 7. [place] for dc to home from cardiac standpoint  Signed, Serafina Royals M.D. FACC

## 2017-01-20 NOTE — Progress Notes (Signed)
[facility] at [place] NAME: Scott [name]    MR#:  [id number]  DATE OF BIRTH:  [birth year]  SUBJECTIVE:  CHIEF COMPLAINT:   Chief Complaint  Patient presents with  . Respiratory Distress   -Had a permacath placed and tolerating dialysis well- 2 sessions done and for third today. -Complains of some soreness around the catheter site.   REVIEW OF SYSTEMS:  Review of Systems  Constitutional: Negative for chills, fever and malaise/fatigue.  HENT: Negative for congestion, ear discharge, hearing loss and nosebleeds.   Eyes: Negative for blurred vision and double vision.  Respiratory: Negative for cough, shortness of breath and wheezing.   Cardiovascular: Negative for chest pain, palpitations and leg swelling.  Gastrointestinal: Negative for abdominal pain, constipation, diarrhea, nausea and vomiting.  Genitourinary: Negative for dysuria.  Musculoskeletal: Negative for myalgias.  Neurological: Negative for dizziness, speech change, focal weakness, seizures and headaches.  Psychiatric/Behavioral: Negative for depression and suicidal ideas.    DRUG ALLERGIES:  No Known Allergies  VITALS:  Blood pressure 126/71, pulse 68, temperature 98 F (36.7 C), temperature source Oral, resp. rate 18, height 5\' 10"  (1.778 m), weight 108.7 kg (239 lb 11.2 oz), SpO2 94 %.  PHYSICAL EXAMINATION:  Physical Exam  GENERAL:  56 y.o.-year-old  Overweight patient lying in the bed with no acute distress.  EYES: Pupils equal, round, reactive to light and accommodation. No scleral icterus. Extraocular muscles intact.  HEENT: Head atraumatic, normocephalic. Oropharynx and nasopharynx clear.  NECK:  Supple, no jugular venous distention. No thyroid enlargement, no tenderness.  LUNGS: Normal breath sounds bilaterally, no wheezing, rales,rhonchi or crepitation. No use of accessory muscles of respiration. Decreased bibasilar breath sounds CARDIOVASCULAR: S1, S2 normal.  No murmurs, rubs, or gallops. Right chest permacath in place ABDOMEN: Soft, nontender, nondistended. Bowel sounds present. No organomegaly or mass.  EXTREMITIES: No pedal edema, cyanosis, or clubbing.  NEUROLOGIC: Cranial nerves II through XII are intact. Muscle strength 5/5 in all extremities. Sensation intact. Gait not checked.  PSYCHIATRIC: The patient is alert and oriented x 3.  SKIN: No obvious rash, lesion, or ulcer.    LABORATORY PANEL:   CBC  Recent Labs Lab 01/18/17 1914  WBC 3.7*  HGB 8.5*  HCT 24.8*  PLT 266   ------------------------------------------------------------------------------------------------------------------  Chemistries   Recent Labs Lab 01/17/17 0050  01/20/17 0554  NA 143  < > 141  K 4.6  < > 4.5  CL 116*  < > 105  CO2 21*  < > 30  GLUCOSE 97  < > 143*  BUN 41*  < > 33*  CREATININE 5.75*  < > 4.71*  CALCIUM 7.3*  < > 7.7*  AST 23  --   --   ALT 19  --   --   ALKPHOS 99  --   --   BILITOT 0.4  --   --   < > = values in this interval not displayed. ------------------------------------------------------------------------------------------------------------------  Cardiac Enzymes  Recent Labs Lab 01/17/17 1942  TROPONINI 1.20*   ------------------------------------------------------------------------------------------------------------------  RADIOLOGY:  No results found.  EKG:   Orders placed or performed during the hospital encounter of 01/17/17  . ED EKG within 10 minutes  . ED EKG within 10 minutes  . EKG 12-Lead  . EKG 12-Lead    ASSESSMENT AND PLAN:   56 year old male with past medical history significant for diastolic congestive heart failure, CK D stage IV, CAD, depression, hyperlipidemia results to hospital secondary to difficulty  breathing and noted to have hypoxia.  #1 acute hypoxic respiratory failure-secondary to acute on chronic diastolic CHF exacerbation. -Recent echocardiogram with normal ejection fraction.  Required BiPAP and admission. Much improved breathing. - off oxygen now -no lasix now as initiated on dialysis  #2 CKD stage IV, now progressed to ESRD-patient's previous baseline creatinine used to be around 2.7, his creatinine worsened up to 6 this admission -[place] nephrology consultation. Started on hemodialysis on 01/18/2017. Day  3 today. -set up outpatient dialysis prior to discharge.  #3 diabetes mellitus-on Lantus- discharge on 70/30 home insulin  and sliding scale insulin.  #4 elevated troponin- demand ischemia, no chest pain - but previously positive stress test with inf wall defect- appreciate cardiology consult - due to his underlying renal disease- cath might not be feasible at this time, especially as troponin trending down and also patient asymptomatic - so continue aspirin, Effient, Coreg, Imdur and statin Encourage ambulation -Outpatient follow-up with cardiology  #5 DVT prophylaxis-on subcutaneous heparin   Possible discharge today   All the records are reviewed and case discussed with Care Management/Social Workerr. Management plans discussed with the patient, family and they are in agreement.  CODE STATUS: Full code  TOTAL TIME TAKING CARE OF THIS PATIENT: 38 minutes.   POSSIBLE D/C today or tomorrow, DEPENDING ON CLINICAL CONDITION.   Gladstone Lighter M.D on 01/20/2017 at 9:31 AM  Between 7am to 6pm - Pager - [phone]  After 6pm go to [email or web address] - password [email or web address] [facility]  Office  [phone]  CC: Primary care physician; [employer]

## 2017-01-20 NOTE — Care Management (Signed)
Patient is knowledgeable to go to [facility] in [place] at 11 AM tomorrow

## 2017-01-20 NOTE — Discharge Summary (Signed)
[facility] at [place] NAME: Scott [name]    MR#:  [id number]  DATE OF BIRTH:  Nov 11, 1960  DATE OF ADMISSION:  01/17/2017   ADMITTING PHYSICIAN: Harrie Foreman, MD  DATE OF DISCHARGE:  01/20/17  PRIMARY CARE PHYSICIAN: Inc, [place]   ADMISSION DIAGNOSIS:   Acute on chronic congestive heart failure, unspecified heart failure type ([place]) [I50.9]  DISCHARGE DIAGNOSIS:   Active Problems:   Acute on chronic systolic CHF (congestive heart failure) ([place])   SECONDARY DIAGNOSIS:   Past Medical History:  Diagnosis Date  . Arthritis    "left arm; right leg" (12/13/2014)  . Asthma   . CHF (congestive heart failure) ([place])   . Chronic disease anemia    [name] 12/13/2014  . Chronic kidney disease (CKD), stage IV (severe) ([place])    [name] 12/13/2014  . Coronary artery disease    [name] 12/13/2014  . Depression   . Dysrhythmia   . GERD (gastroesophageal reflux disease)   . High cholesterol    [name] 12/13/2014  . Hypertension   . Non-Q wave myocardial infarction ([place])    [name] 12/13/2014  . PVD (peripheral vascular disease) ([place])    [name] 12/13/2014  . Type II diabetes mellitus [facility])     HOSPITAL COURSE:   56 year old male with past medical history significant for diastolic congestive heart failure, CK D stage IV, CAD, depression, hyperlipidemia results to hospital secondary to difficulty breathing and noted to have hypoxia.  #1 acute hypoxic respiratory failure-secondary to acute on chronic diastolic CHF exacerbation. -Recent echocardiogram with normal ejection fraction. Required BiPAP and admission. Much improved breathing. - off oxygen now -no lasix now as initiated on dialysis  #2 CKD stage IV, now progressed to ESRD-patient's previous baseline creatinine used to be around 2.7, his creatinine worsened up to 6 this admission -[place] nephrology consultation. Started on hemodialysis on 01/18/2017. Day  3  today. -set up outpatient dialysis prior to discharge. Can be started at [facility], [place] [place]- dialysis T-Th-S starting 01/21/17  #3 diabetes mellitus-on Lantus- discharge on 70/30 home insulin  and sliding scale insulin.  #4 elevated troponin- demand ischemia, no chest pain - but previously positive stress test with inf wall defect- appreciate cardiology consult - due to his underlying renal disease- cath might not be feasible at this time, especially as troponin trending down and also patient asymptomatic - so continue aspirin, Effient, Coreg, Imdur and statin Encourage ambulation -Outpatient follow-up with cardiology   discharge today  DISCHARGE CONDITIONS:   Stable  CONSULTS OBTAINED:   Treatment Team:  Anthonette Legato, MD Corey Skains, MD Schnier, [name], MD  DRUG ALLERGIES:   No Known Allergies DISCHARGE MEDICATIONS:   Allergies as of 01/20/2017   No Known Allergies     Medication List    STOP taking these medications   cetirizine 10 MG tablet Commonly known as:  ZYRTEC   diphenhydrAMINE 25 mg capsule Commonly known as:  BENADRYL   exenatide 5 MCG/0.02ML Sopn injection Commonly known as:  BYETTA   hydrochlorothiazide 12.5 MG capsule Commonly known as:  MICROZIDE   pioglitazone 15 MG tablet Commonly known as:  ACTOS     TAKE these medications   acetaminophen 325 MG tablet Commonly known as:  TYLENOL Take 325 mg by mouth every 6 (six) hours as needed for mild pain.   albuterol 108 (90 Base) MCG/ACT inhaler Commonly known as:  PROVENTIL HFA;VENTOLIN HFA Inhale 2 puffs into the lungs every  6 (six) hours as needed for wheezing or shortness of breath.   aspirin EC 81 MG tablet Take 1 tablet (81 mg total) by mouth daily.   atorvastatin 80 MG tablet Commonly known as:  LIPITOR Take 1 tablet (80 mg total) by mouth daily at 6 PM.   carvedilol 25 MG tablet Commonly known as:  COREG Take 1 tablet (25 mg total) by mouth 2 (two) times daily with a  meal.   insulin aspart protamine- aspart (70-30) 100 UNIT/ML injection Commonly known as:  NOVOLOG MIX 70/30 Inject 0.35 mLs (35 Units total) into the skin 2 (two) times daily with a meal. What changed:  how much to take   isosorbide-hydrALAZINE 20-37.5 MG tablet Commonly known as:  BIDIL Take 1 tablet by mouth 3 (three) times daily.   nitroGLYCERIN 0.4 MG SL tablet Commonly known as:  NITROSTAT Place 1 tablet (0.4 mg total) under the tongue every 5 (five) minutes x 3 doses as needed for chest pain.   omeprazole 20 MG capsule Commonly known as:  PRILOSEC Take 1 capsule (20 mg total) by mouth daily.   prasugrel 10 MG Tabs tablet Commonly known as:  EFFIENT Take 1 tablet (10 mg total) by mouth daily.   Vitamin D 2000 units tablet Take 2,000 Units by mouth daily.        DISCHARGE INSTRUCTIONS:   1. PCP f/u in 1-2 weeks 2. For dialysis starting tomorrow  DIET:   Renal diet  ACTIVITY:   Activity as tolerated  OXYGEN:   Home Oxygen: No.  Oxygen Delivery: room air  DISCHARGE LOCATION:   home   If you experience worsening of your admission symptoms, develop shortness of breath, life threatening emergency, suicidal or homicidal thoughts you must seek medical attention immediately by calling 911 or calling your MD immediately  if symptoms less severe.  You Must read complete instructions/literature along with all the possible adverse reactions/side effects for all the Medicines you take and that have been prescribed to you. Take any new Medicines after you have completely understood and accpet all the possible adverse reactions/side effects.   Please note  You were cared for by a hospitalist during your hospital stay. If you have any questions about your discharge medications or the care you received while you were in the hospital after you are discharged, you can call the unit and asked to speak with the hospitalist on call if the hospitalist that took care of you is  not available. Once you are discharged, your primary care physician will handle any further medical issues. Please note that NO REFILLS for any discharge medications will be authorized once you are discharged, as it is imperative that you return to your primary care physician (or establish a relationship with a primary care physician if you do not have one) for your aftercare needs so that they can reassess your need for medications and monitor your lab values.    On the day of Discharge:  VITAL SIGNS:   Blood pressure (!) 195/104, pulse 67, temperature 98.1 F (36.7 C), temperature source Oral, resp. rate 16, height 5\' 10"  (1.778 m), weight 106.1 kg (233 lb 14.5 oz), SpO2 99 %.  PHYSICAL EXAMINATION:   GENERAL:  56 y.o.-year-old  Overweight patient lying in the bed with no acute distress.  EYES: Pupils equal, round, reactive to light and accommodation. No scleral icterus. Extraocular muscles intact.  HEENT: Head atraumatic, normocephalic. Oropharynx and nasopharynx clear.  NECK:  Supple, no jugular venous distention.  No thyroid enlargement, no tenderness.  LUNGS: Normal breath sounds bilaterally, no wheezing, rales,rhonchi or crepitation. No use of accessory muscles of respiration. Decreased bibasilar breath sounds CARDIOVASCULAR: S1, S2 normal. No murmurs, rubs, or gallops. Right chest permacath in place ABDOMEN: Soft, nontender, nondistended. Bowel sounds present. No organomegaly or mass.  EXTREMITIES: No pedal edema, cyanosis, or clubbing.  NEUROLOGIC: Cranial nerves II through XII are intact. Muscle strength 5/5 in all extremities. Sensation intact. Gait not checked.  PSYCHIATRIC: The patient is alert and oriented x 3.  SKIN: No obvious rash, lesion, or ulcer.    DATA REVIEW:   CBC  Recent Labs Lab 01/20/17 1030  WBC 9.2  HGB 8.7*  HCT 26.0*  PLT 275    Chemistries   Recent Labs Lab 01/17/17 0050  01/20/17 0554  NA 143  < > 141  K 4.6  < > 4.5  CL 116*  < > 105  CO2  21*  < > 30  GLUCOSE 97  < > 143*  BUN 41*  < > 33*  CREATININE 5.75*  < > 4.71*  CALCIUM 7.3*  < > 7.7*  AST 23  --   --   ALT 19  --   --   ALKPHOS 99  --   --   BILITOT 0.4  --   --   < > = values in this interval not displayed.   Microbiology Results  Results for orders placed or performed during the hospital encounter of 01/17/17  MRSA PCR Screening     Status: None   Collection Time: 01/17/17  3:56 AM  Result Value Ref Range Status   MRSA by PCR NEGATIVE NEGATIVE Final    Comment:        The GeneXpert MRSA Assay (FDA approved for NASAL specimens only), is one component of a comprehensive MRSA colonization surveillance program. It is not intended to diagnose MRSA infection nor to guide or monitor treatment for MRSA infections.     RADIOLOGY:  No results found.   Management plans discussed with the patient, family and they are in agreement.  CODE STATUS:     Code Status Orders        Start     Ordered   01/17/17 0339  Full code  Continuous     01/17/17 0338    Code Status History    Date Active Date Inactive Code Status Order ID Comments User Context   11/13/2016 10:13 AM 11/20/2016 12:56 PM Full Code [id number]  Hillary Bow, MD ED   01/09/2015 10:19 AM 01/10/2015  3:18 PM Full Code [id number]  Max Sane, MD Inpatient   12/18/2014  4:20 PM 12/19/2014  4:27 PM Full Code [id number]  Charolette Forward, MD Inpatient   12/14/2014  4:00 PM 12/18/2014  4:20 PM Full Code [id number]  Charolette Forward, MD Inpatient   12/13/2014  1:24 PM 12/14/2014  4:00 PM Full Code [id number]  Charolette Forward, MD Inpatient      TOTAL TIME TAKING CARE OF THIS PATIENT: 38 minutes.    Gladstone Lighter M.D on 01/20/2017 at 2:21 PM  Between 7am to 6pm - Pager - [phone]  After 6pm go to [email or web address] - [employer] [facility]  Office  [phone]  CC: Primary care physician; [employer]   Note: This dictation was prepared with  D[employer] operational officer dictation along with smaller Company secretary. Any transcriptional errors that result from this process are unintentional.

## 2017-01-27 ENCOUNTER — Ambulatory Visit: Payer: Medicare Other | Attending: Family | Admitting: Family

## 2017-01-27 ENCOUNTER — Encounter: Payer: Self-pay | Admitting: Family

## 2017-01-27 VITALS — BP 164/88 | HR 81 | Resp 20 | Ht 70.0 in | Wt 240.0 lb

## 2017-01-27 DIAGNOSIS — Z992 Dependence on renal dialysis: Secondary | ICD-10-CM | POA: Insufficient documentation

## 2017-01-27 DIAGNOSIS — I739 Peripheral vascular disease, unspecified: Secondary | ICD-10-CM | POA: Diagnosis not present

## 2017-01-27 DIAGNOSIS — Z955 Presence of coronary angioplasty implant and graft: Secondary | ICD-10-CM | POA: Diagnosis not present

## 2017-01-27 DIAGNOSIS — E785 Hyperlipidemia, unspecified: Secondary | ICD-10-CM | POA: Insufficient documentation

## 2017-01-27 DIAGNOSIS — I509 Heart failure, unspecified: Secondary | ICD-10-CM | POA: Diagnosis not present

## 2017-01-27 DIAGNOSIS — I5032 Chronic diastolic (congestive) heart failure: Secondary | ICD-10-CM

## 2017-01-27 DIAGNOSIS — I252 Old myocardial infarction: Secondary | ICD-10-CM | POA: Insufficient documentation

## 2017-01-27 DIAGNOSIS — K219 Gastro-esophageal reflux disease without esophagitis: Secondary | ICD-10-CM | POA: Insufficient documentation

## 2017-01-27 DIAGNOSIS — E1122 Type 2 diabetes mellitus with diabetic chronic kidney disease: Secondary | ICD-10-CM | POA: Insufficient documentation

## 2017-01-27 DIAGNOSIS — N186 End stage renal disease: Secondary | ICD-10-CM | POA: Diagnosis not present

## 2017-01-27 DIAGNOSIS — E7800 Pure hypercholesterolemia, unspecified: Secondary | ICD-10-CM | POA: Insufficient documentation

## 2017-01-27 DIAGNOSIS — Z794 Long term (current) use of insulin: Secondary | ICD-10-CM

## 2017-01-27 DIAGNOSIS — I10 Essential (primary) hypertension: Secondary | ICD-10-CM

## 2017-01-27 DIAGNOSIS — Z87891 Personal history of nicotine dependence: Secondary | ICD-10-CM | POA: Diagnosis not present

## 2017-01-27 DIAGNOSIS — I2510 Atherosclerotic heart disease of native coronary artery without angina pectoris: Secondary | ICD-10-CM | POA: Insufficient documentation

## 2017-01-27 DIAGNOSIS — N185 Chronic kidney disease, stage 5: Secondary | ICD-10-CM

## 2017-01-27 DIAGNOSIS — I110 Hypertensive heart disease with heart failure: Secondary | ICD-10-CM | POA: Diagnosis present

## 2017-01-27 DIAGNOSIS — I132 Hypertensive heart and chronic kidney disease with heart failure and with stage 5 chronic kidney disease, or end stage renal disease: Secondary | ICD-10-CM | POA: Insufficient documentation

## 2017-01-27 NOTE — Patient Instructions (Signed)
Continue weighing daily and call for an overnight weight gain of > 2 pounds or a weekly weight gain of >5 pounds. 

## 2017-01-27 NOTE — Progress Notes (Signed)
Patient ID: Scott [name], male    DOB: 10/30/1960, 56 y.o.   MRN: [id number]  HPI  Scott [name] is a 56 yoM with PMH of HTN, ESRD on dialysis M/W/F, T2DM, HLD, CAD, NSTEMI s/p stents 4/17, congestive heart failure with preserved ejection fraction.  Echo on 11/13/16 showed EF of 55-60% similar to echo on 01/09/15 showing EF of 55-65%  Last admitted on 01/17/17 for respiratory distress due to CHF exacerbation. Cardiology, vascular, and nephrology was consulted. Scott [name] was initiated on dialysis and received Bipap. Upon discharge HCTZ, Byetta, and pioglitazone were d/c. Admitted on 11/13/16 for SOB x 3 weeks and leg swelling. Scott [name] was also found to be in AKI. Initially treated with IV furosemide which was d/c along with lisinopril due to AKI. Pt also had proteinuria. Upon discharge lisinopril, furosemide and spirolactone was d/c  Presents today for a follow-up visit with a chief complaint of chronic shortness of breath. Scott [name] was recently started on dialysis last admission and feels as if Scott [name] SOB is improving. Has mild leg swelling today, but has dialysis scheduled for later today. Has some congestion, rhinorrhea, and cough due to allergies. Some light-headedness and dizziness associated with low blood pressure.   Past Medical History:  Diagnosis Date  . Arthritis    "left arm; right leg" (12/13/2014)  . Asthma   . CHF (congestive heart failure) ([place])   . Chronic disease anemia    [name] 12/13/2014  . Chronic kidney disease (CKD), stage IV (severe) ([place])    [name] 12/13/2014  . Coronary artery disease    [name] 12/13/2014  . Depression   . Dysrhythmia   . GERD (gastroesophageal reflux disease)   . High cholesterol    [name] 12/13/2014  . Hypertension   . Non-Q wave myocardial infarction ([place])    [name] 12/13/2014  . PVD (peripheral vascular disease) ([place])    [name] 12/13/2014  . Type II diabetes mellitus ([place])    Past Surgical History:  Procedure Laterality Date  . CARDIAC CATHETERIZATION  11/2014  .  DIALYSIS/PERMA CATHETER INSERTION N/A 01/18/2017   Procedure: Dialysis/Perma Catheter Insertion;  Surgeon: Algernon Huxley, MD;  Location: [place] [facility];  Service: Cardiovascular;  Laterality: N/A;  . INCISION AND DRAINAGE OF WOUND Right ~ 10/2014   "4th toe foot"  . PERCUTANEOUS CORONARY STENT INTERVENTION (PCI-S) N/A 12/14/2014   Procedure: PERCUTANEOUS CORONARY STENT INTERVENTION (PCI-S);  Surgeon: Charolette Forward, MD;  Location: [facility] CATH LAB;  Service: Cardiovascular;  Laterality: N/A;  . PERCUTANEOUS CORONARY STENT INTERVENTION (PCI-S) N/A 12/18/2014   Procedure: PERCUTANEOUS CORONARY STENT INTERVENTION (PCI-S);  Surgeon: Charolette Forward, MD;  Location: [facility] CATH LAB;  Service: Cardiovascular;  Laterality: N/A;  . TOE AMPUTATION Right 2015   4th toe   Family History  Problem Relation Age of Onset  . Cancer Mother        Lung  . Cancer Father        Lung  . Diabetes Sister   . Diabetes Brother   . CAD Brother   . Hernia Son   . Cancer Brother    Social History  Substance Use Topics  . Smoking status: Former Smoker    Packs/day: 0.00    Years: 30.00    Types: Cigarettes    Start date: 08/31/1984    Quit date: 11/30/2014  . Smokeless tobacco: Never Used  . Alcohol use Yes     Comment: Rarely, twice a year.   No Known Allergies Prior to Admission medications  Medication Sig Start Date End Date Taking? Authorizing Provider  acetaminophen (TYLENOL) 80 MG chewable tablet Chew 80 mg by mouth every 6 (six) hours as needed.   Yes Historical Provider, MD  albuterol (PROVENTIL HFA;VENTOLIN HFA) 108 (90 BASE) MCG/ACT inhaler Inhale 2 puffs into the lungs every 6 (six) hours as needed for wheezing or shortness of breath. 01/10/15  Yes Gladstone Lighter, MD  aspirin EC 81 MG tablet Take 1 tablet (81 mg total) by mouth daily. 01/10/15  Yes Gladstone Lighter, MD  atorvastatin (LIPITOR) 80 MG tablet Take 1 tablet (80 mg total) by mouth daily at 6 PM. 01/10/15  Yes Gladstone Lighter, MD  carvedilol  (COREG) 25 MG tablet Take 1 tablet (25 mg total) by mouth 2 (two) times daily with a meal. 01/10/15  Yes Gladstone Lighter, MD  cetirizine (ZYRTEC) 10 MG tablet Take 10 mg by mouth daily.   Yes Historical Provider, MD  Cholecalciferol (VITAMIN D) 2000 units tablet Take 2,000 Units by mouth daily.   Yes Historical Provider, MD  diphenhydrAMINE (BENADRYL) 25 mg capsule Take 1-2 tablets by mouth every 6 hours as needed Patient taking differently: 25 mg daily.  01/14/16  Yes Jami L Hagler, PA-C  exenatide (BYETTA) 5 MCG/0.02ML SOPN injection Inject 5 mcg into the skin 2 (two) times daily. 11/05/16  Yes Historical Provider, MD  hydrochlorothiazide (MICROZIDE) 12.5 MG capsule Take 12.5 mg by mouth daily.   Yes Historical Provider, MD  insulin aspart protamine- aspart (NOVOLOG MIX 70/30) (70-30) 100 UNIT/ML injection Inject 0.35 mLs (35 Units total) into the skin 2 (two) times daily with a meal. Patient taking differently: Inject 30 Units into the skin 2 (two) times daily with a meal.  01/10/15  Yes Gladstone Lighter, MD  isosorbide-hydrALAZINE (BIDIL) 20-37.5 MG per tablet Take 1 tablet by mouth 3 (three) times daily. 01/10/15  Yes Gladstone Lighter, MD  nitroGLYCERIN (NITROSTAT) 0.4 MG SL tablet Place 1 tablet (0.4 mg total) under the tongue every 5 (five) minutes x 3 doses as needed for chest pain. 12/19/14  Yes Charolette Forward, MD  omeprazole (PRILOSEC) 20 MG capsule Take 1 capsule (20 mg total) by mouth daily. 01/10/15  Yes Gladstone Lighter, MD  pioglitazone (ACTOS) 15 MG tablet Take 15 mg by mouth daily.   Yes Historical Provider, MD  prasugrel (EFFIENT) 10 MG TABS tablet Take 1 tablet (10 mg total) by mouth daily. 01/10/15  Yes Gladstone Lighter, MD    Review of Systems  Constitutional: Negative for appetite change and fatigue.  HENT: Positive for congestion (allergies) and rhinorrhea. Negative for sore throat.   Eyes: Negative for pain and visual disturbance.  Respiratory: Positive for cough (Allergies)  and shortness of breath. Negative for chest tightness and wheezing.   Cardiovascular: Negative for chest pain, palpitations and leg swelling.  Gastrointestinal: Negative for abdominal distention and abdominal pain.  Endocrine: Negative.   Genitourinary: Negative.   Musculoskeletal: Negative for back pain and neck pain.  Skin: Negative.   Allergic/Immunologic: Negative.   Neurological: Positive for dizziness (When BP gets to 117) and light-headedness.  Hematological: Negative for adenopathy. Does not bruise/bleed easily.  Psychiatric/Behavioral: Negative for dysphoric mood and sleep disturbance (sleeping on 2 pillows). The patient is not nervous/anxious.    Vitals:   01/27/17 0939  BP: (!) 164/88  Pulse: 81  Resp: 20  SpO2: 100%  Weight: 240 lb (108.9 kg)  Height: 5\' 10"  (1.778 m)   Wt Readings from Last 3 Encounters:  01/27/17 240 lb (108.9 kg)  01/20/17 233 lb 14.5 oz (106.1 kg)  12/28/16 246 lb (111.6 kg)   Lab Results  Component Value Date   CREATININE 4.71 (H) 01/20/2017   CREATININE 4.82 (H) 01/19/2017   CREATININE 6.33 (H) 01/18/2017    Physical Exam  Constitutional: Scott [name] is oriented to person, place, and time. Scott [name] appears well-developed and well-nourished.  HENT:  Head: Normocephalic and atraumatic.  Neck: Normal range of motion. Neck supple. No JVD present.  Cardiovascular: Normal rate and regular rhythm.   Pulmonary/Chest: Effort normal. Scott [name] has no wheezes. Scott [name] has no rales.  Abdominal: Soft. Scott [name] exhibits no distension. There is no tenderness.  Musculoskeletal: Scott [name] exhibits no edema or tenderness.  Neurological: Scott [name] is alert and oriented to person, place, and time.  Skin: Skin is warm and dry.  Psychiatric: Scott [name] has a normal mood and affect. Scott [name] behavior is normal. Thought content normal.  Nursing note and vitals reviewed.   Assessment & Plan:  1. Congestive heart failure with preserved ejection fraction - NYHA class II - euvolumic - Not adding salt to foods.  Encouraged a < 2000 mg sodium diet.  - weighing daily. Scott [name] has noticed some weight gain of 3-4 pounds overnight. Reminded him to call for overnight weight gain of >2 pounds or weekly weight gain of >5 pounds - saw cardiologist ([place]) 10/21/16  2. T2DM - checking 3x daily; fasting this morning was 124 - 11/13/16 A1c 7.0% - pioglitazone and Byetta was d/c upon discharge from the hospital, but was told to continue by nephrologist as Scott [name] sugars are 230-240 in the mornings without these medications. If HF exacerbations continue may need to readdress stopping pioglitazone and increasing insulin dose.  - continue 35 units BID of Novolog 70/30 - see Scott [name] PCP on Friday   3. HTN - BP a little elevated today (164/88) without medications this morning - checks BP frequently at home with arm cuff - BP at home has been higher in the AM (158-140/80) and lower in the middle of the day (116/67); Scott [name] has been experiencing dizziness and light-headedness when BP gets in the 110's  - encouraged him to continue checking BP daily  - on carvedilol and Bidil   4. CKD - SCr on 01/20/17 was 4.71 - started on dialysis M/W/F - saw nephrologist ([place]) 01/20/17   5. HLD - continue atorvastatin 80mg  - lipid panel on 12/14/14 was TC 100, HDL 37, LDL 43, TG 98  Medication bottles were reviewed.   Return to clinic in 3 months or sooner if needed.

## 2017-02-10 ENCOUNTER — Emergency Department
Admission: EM | Admit: 2017-02-10 | Discharge: 2017-02-10 | Disposition: A | Discharge status: Home / Self Care | Payer: Medicare Other | Attending: Emergency Medicine | Admitting: Emergency Medicine

## 2017-02-10 ENCOUNTER — Emergency Department: Payer: Medicare Other

## 2017-02-10 DIAGNOSIS — N184 Chronic kidney disease, stage 4 (severe): Secondary | ICD-10-CM | POA: Insufficient documentation

## 2017-02-10 DIAGNOSIS — J45909 Unspecified asthma, uncomplicated: Secondary | ICD-10-CM | POA: Diagnosis not present

## 2017-02-10 DIAGNOSIS — I509 Heart failure, unspecified: Secondary | ICD-10-CM | POA: Insufficient documentation

## 2017-02-10 DIAGNOSIS — R42 Dizziness and giddiness: Secondary | ICD-10-CM | POA: Diagnosis not present

## 2017-02-10 DIAGNOSIS — H538 Other visual disturbances: Secondary | ICD-10-CM | POA: Diagnosis not present

## 2017-02-10 DIAGNOSIS — I959 Hypotension, unspecified: Secondary | ICD-10-CM | POA: Diagnosis present

## 2017-02-10 DIAGNOSIS — I2510 Atherosclerotic heart disease of native coronary artery without angina pectoris: Secondary | ICD-10-CM | POA: Insufficient documentation

## 2017-02-10 DIAGNOSIS — R0602 Shortness of breath: Secondary | ICD-10-CM | POA: Diagnosis not present

## 2017-02-10 DIAGNOSIS — I110 Hypertensive heart disease with heart failure: Secondary | ICD-10-CM | POA: Insufficient documentation

## 2017-02-10 DIAGNOSIS — I129 Hypertensive chronic kidney disease with stage 1 through stage 4 chronic kidney disease, or unspecified chronic kidney disease: Secondary | ICD-10-CM | POA: Diagnosis not present

## 2017-02-10 DIAGNOSIS — Z87891 Personal history of nicotine dependence: Secondary | ICD-10-CM | POA: Diagnosis not present

## 2017-02-10 DIAGNOSIS — E1122 Type 2 diabetes mellitus with diabetic chronic kidney disease: Secondary | ICD-10-CM | POA: Diagnosis not present

## 2017-02-10 DIAGNOSIS — I214 Non-ST elevation (NSTEMI) myocardial infarction: Secondary | ICD-10-CM | POA: Insufficient documentation

## 2017-02-10 LAB — BASIC METABOLIC PANEL
Anion gap: 9 (ref 5–15)
BUN: 13 mg/dL (ref 6–20)
CALCIUM: 8.2 mg/dL — AB (ref 8.9–10.3)
CO2: 31 mmol/L (ref 22–32)
CREATININE: 3.01 mg/dL — AB (ref 0.61–1.24)
Chloride: 95 mmol/L — ABNORMAL LOW (ref 101–111)
GFR, EST AFRICAN AMERICAN: 25 mL/min — AB (ref >60)
GFR, EST NON AFRICAN AMERICAN: 21 mL/min — AB (ref >60)
Glucose, Bld: 294 mg/dL — ABNORMAL HIGH (ref 65–99)
Potassium: 3 mmol/L — ABNORMAL LOW (ref 3.5–5.1)
SODIUM: 135 mmol/L (ref 135–145)

## 2017-02-10 LAB — CBC
HCT: 28.7 % — ABNORMAL LOW (ref 40.0–52.0)
Hemoglobin: 9.8 g/dL — ABNORMAL LOW (ref 13.0–18.0)
MCH: 31.3 pg (ref 26.0–34.0)
MCHC: 34.1 g/dL (ref 32.0–36.0)
MCV: 91.9 fL (ref 80.0–100.0)
Platelets: 325 10*3/uL (ref 150–440)
RBC: 3.12 MIL/uL — ABNORMAL LOW (ref 4.40–5.90)
RDW: 14.5 % (ref 11.5–14.5)
WBC: 8.8 10*3/uL (ref 3.8–10.6)

## 2017-02-10 LAB — TROPONIN I: TROPONIN I: 0.09 ng/mL — AB (ref <0.03)

## 2017-02-10 MED ORDER — ASPIRIN 81 MG PO CHEW: 324.0000 mg | CHEWABLE_TABLET | Freq: Once | ORAL | Status: DC

## 2017-02-10 NOTE — ED Triage Notes (Signed)
Pt comes via [facility] for c/o hypotension. Pt states he has dialysis today and got home he was feeling dizzy and eyes blurry. Pt states he laid down for a little bit and started having little trouble breathing. Pt states he checked his BP which was 67/54. Pt called EMS, per EMS initial BP 112/70, 97% room air, NSR on monitor. Pt alert and oriented.

## 2017-02-10 NOTE — ED Provider Notes (Addendum)
[facility] Emergency Department Provider Note  ____________________________________________  Time seen: Approximately 7:02 PM  I have reviewed the triage vital signs and the nursing notes.   HISTORY  Chief Complaint Hypotension    HPI Scott [name] is a 56 y.o. male , newly initiated on hemodialysis, presenting with hypotension, lightheadedness and blurred vision after dialysis. The patient went for dialysis today, drove home, and then developed a lightheaded sensation with some blurry vision, and mild shortness of breath that was worse when he lay down. He checked his own blood pressure and found it to be 67/54 with his home blood pressure cuff. The paramedics were called and his blood pressure was 112/70 at that time. At this time, the patient is feeling completely back to normal. He denies any recent illness include cough or cold symptoms, fever or chills, nausea vomiting or diarrhea, chest pain, palpitations or other lightheadedness.   Past Medical History:  Diagnosis Date  . Arthritis    "left arm; right leg" (12/13/2014)  . Asthma   . CHF (congestive heart failure) ([place])   . Chronic disease anemia    [name] 12/13/2014  . Chronic kidney disease (CKD), stage IV (severe) ([place])    [name] 12/13/2014  . Coronary artery disease    [name] 12/13/2014  . Depression   . Dysrhythmia   . GERD (gastroesophageal reflux disease)   . High cholesterol    [name] 12/13/2014  . Hypertension   . Non-Q wave myocardial infarction ([place])    [name] 12/13/2014  . PVD (peripheral vascular disease) ([place])    [name] 12/13/2014  . Type II diabetes mellitus [facility])     Patient Active Problem List   Diagnosis Date Noted  . Acute on chronic systolic CHF (congestive heart failure) ([place]) 01/17/2017  . CKD (chronic kidney disease), stage V ([place]) 11/25/2016  . Tobacco abuse 11/25/2016  . Acute renal failure (ARF) ([place]) 11/15/2016  . CHF (congestive heart failure) ([place]) 11/13/2016   . Diabetes ([place]) 09/17/2015  . Decreased renal function 05/23/2015  . GERD (gastroesophageal reflux disease) 04/04/2015  . Hypertension 02/26/2015    Past Surgical History:  Procedure Laterality Date  . CARDIAC CATHETERIZATION  11/2014  . DIALYSIS/PERMA CATHETER INSERTION N/A 01/18/2017   Procedure: Dialysis/Perma Catheter Insertion;  Surgeon: Algernon Huxley, MD;  Location: [place] [facility];  Service: Cardiovascular;  Laterality: N/A;  . INCISION AND DRAINAGE OF WOUND Right ~ 10/2014   "4th toe foot"  . PERCUTANEOUS CORONARY STENT INTERVENTION (PCI-S) N/A 12/14/2014   Procedure: PERCUTANEOUS CORONARY STENT INTERVENTION (PCI-S);  Surgeon: Charolette Forward, MD;  Location: [facility] CATH LAB;  Service: Cardiovascular;  Laterality: N/A;  . PERCUTANEOUS CORONARY STENT INTERVENTION (PCI-S) N/A 12/18/2014   Procedure: PERCUTANEOUS CORONARY STENT INTERVENTION (PCI-S);  Surgeon: Charolette Forward, MD;  Location: [facility] CATH LAB;  Service: Cardiovascular;  Laterality: N/A;  . TOE AMPUTATION Right 2015   4th toe    Current Outpatient Rx  . Order #: [id number] Class: Normal  . Order #: [id number] Class: Print  . Order #: [id number] Class: Normal  . Order #: [id number] Class: Normal  . Order #: [id number] Class: Historical Med  . Order #: [id number] Class: Historical Med  . Order #: [id number] Class: Historical Med  . Order #: [id number] Class: Historical Med  . Order #: [id number] Class: Normal  . Order #: [id number] Class: Normal  . Order #: [id number] Class: Historical Med  . Order #: [id number] Class: Historical Med  . Order #: [id number] Class: Normal  . Order #: [id number] Class: Historical Med  . Order #: [id number] Class:  Normal  . Order #: [id number] Class: Normal    Allergies Patient has no known allergies.  Family History  Problem Relation Age of Onset  . Cancer Mother        Lung  . Cancer Father        Lung  . Diabetes Sister   . Diabetes Brother   . CAD Brother   . Hernia Son   . Cancer Brother     Social  History Social History  Substance Use Topics  . Smoking status: Former Smoker    Packs/day: 0.00    Years: 30.00    Types: Cigarettes    Start date: 08/31/1984    Quit date: 11/30/2014  . Smokeless tobacco: Never Used  . Alcohol use Yes     Comment: Rarely, twice a year.    Review of Systems Constitutional: No fever/chills.Positive lightheadedness. No syncope. Eyes: + blurry vision. ENT: No sore throat. No congestion or rhinorrhea. Cardiovascular: Denies chest pain. Denies palpitations. Respiratory: Positive shortness of breath.  No cough. Gastrointestinal: No abdominal pain.  No nausea, no vomiting.  No diarrhea.  No constipation. Genitourinary: Negative for dysuria. Musculoskeletal: Negative for back pain. Skin: Negative for rash. Neurological: Negative for headaches. No focal numbness, tingling or weakness.  Hematological/Lymphatic:No difficulty with his PermCath.    ____________________________________________   PHYSICAL EXAM:  VITAL SIGNS: ED Triage Vitals  Enc Vitals Group     BP 02/10/17 1839 116/85     Pulse Rate 02/10/17 1839 86     Resp 02/10/17 1839 18     Temp 02/10/17 1839 98.3 F (36.8 C)     Temp Source 02/10/17 1839 Oral     SpO2 02/10/17 1839 100 %     Weight 02/10/17 1840 240 lb (108.9 kg)     Height 02/10/17 1840 5\' 10"  (1.778 m)     Head Circumference --      Peak Flow --      Pain Score --      Pain Loc --      Pain Edu? --      Excl. in [place]? --     Constitutional: Alert and oriented. Chronically ill appearing but in no acute distress. Answers questions appropriately. Eyes: Conjunctivae are normal.  EOMI. No scleral icterus. Head: Atraumatic. Nose: No congestion/rhinnorhea. Mouth/Throat: Mucous membranes are moist.  Neck: No stridor.  Supple.  No JVD. No meningismus. Cardiovascular: Normal rate, regular rhythm. No murmurs, rubs or gallops. PermCath in the right upper chest that is clean dry and intact without any surrounding erythema,  purulence, joints or discharge. Respiratory: Normal respiratory effort.  No accessory muscle use or retractions. Lungs CTAB.  No wheezes, rales or ronchi. Gastrointestinal: Obese. Soft, nontender and nondistended.  No guarding or rebound.  No peritoneal signs. Musculoskeletal: No LE edema. Neurologic:  A&Ox3.  Speech is clear.  Face and smile are symmetric.  EOMI.  Moves all extremities well. Skin:  Skin is warm, dry and intact. No rash noted. Psychiatric: Mood and affect are normal.   ____________________________________________   LABS (all labs ordered are listed, but only abnormal results are displayed)  Labs Reviewed  CBC - Abnormal; Notable for the following:       Result Value   RBC 3.12 (*)    Hemoglobin 9.8 (*)    HCT 28.7 (*)    All other components within normal limits  BASIC METABOLIC PANEL  TROPONIN I  URINALYSIS, COMPLETE (UACMP) WITH MICROSCOPIC   ____________________________________________  EKG  ED  ECG REPORT I, Eula Listen, the attending physician, personally viewed and interpreted this ECG.   Date: 02/10/2017  EKG Time: 1845  Rate: 85  Rhythm: normal sinus rhythm  Axis: leftward  Intervals:prolonged QTc  ST&T Change: No STEMI. T-wave inversion in V5 and V6 which is new compared to 01/17/17  ____________________________________________  RADIOLOGY  Dg Chest 2 View  Result Date: 02/10/2017 CLINICAL DATA:  Hypotension EXAM: CHEST  2 VIEW COMPARISON:  01/17/2017 FINDINGS: Right-sided central venous catheter tip overlies the distal SVC. Cardiomegaly with aortic atherosclerosis. No focal consolidation or pleural effusion. No pneumothorax. IMPRESSION: Cardiomegaly without edema or infiltrate. Electronically Signed   By: Donavan Foil M.D.   On: 02/10/2017 19:57    ____________________________________________   PROCEDURES  Procedure(s) performed: None  Procedures  Critical Care performed:  Yes ____________________________________________   INITIAL IMPRESSION / ASSESSMENT AND PLAN / ED COURSE  Pertinent labs & imaging results that were available during my care of the patient were reviewed by me and considered in my medical decision making (see chart for details).  56 y.o. male, newly initiated on hemodialysis presenting with hypotension, lightheadedness and blurred vision at home, now resolved. Overall, the patient has reassuring vital signs at this time. Is possible that he became hypovolemic from his hemodialysis, resulting in transient hypotension. We will also get basic labs, urinalysis, chest x-ray, but if his workup is reassuring and he continues to feel well with stable vital signs, and to spit he will be able to be discharged home. There is no evidence for arrhythmia, serious infection or other obvious cause for hypotension.  ----------------------------------------- 8:01 PM on 02/10/2017 -----------------------------------------  The patient's blood pressure and clinical symtpoms have been stable throughout his ED stay.  His BS was 50 on the BMP, and the patient takes Byetta but has not eaten all day and has no symptoms at this time.  We have given him a tray, and I will plan to admit the patient overnight for observation.  ----------------------------------------- 8:28 PM on 02/10/2017 -----------------------------------------  The patient does have a elevated troponin of 0.09 and these new T-wave inversions, so he will need further monitoring of his cardiac status. Aspirin has been ordered. He is mildly hypokalemic, but this is to be expected if he had a full dialysis session today. Plan admission at this time.  CRITICAL CARE Performed by: Eula Listen   Total critical care time: 35 minutes  Critical care time was exclusive of separately billable procedures and treating other patients.  Critical care was necessary to treat or prevent imminent or  life-threatening deterioration.  Critical care was time spent personally by me on the following activities: development of treatment plan with patient and/or surrogate as well as nursing, discussions with consultants, evaluation of patient's response to treatment, examination of patient, obtaining history from patient or surrogate, ordering and performing treatments and interventions, ordering and review of laboratory studies, ordering and review of radiographic studies, pulse oximetry and re-evaluation of patient's condition.   ____________________________________________  FINAL CLINICAL IMPRESSION(S) / ED DIAGNOSES  Final diagnoses:  Hypotension, unspecified hypotension type  Blurred vision, bilateral  Lightheadedness  Shortness of breath  Hypoglycemia         NEW MEDICATIONS STARTED DURING THIS VISIT:  New Prescriptions   No medications on file      Eula Listen, MD 02/10/17 2002    Eula Listen, MD 02/10/17 2028    Eula Listen, MD 02/10/17 2033

## 2017-02-10 NOTE — Discharge Instructions (Signed)
Please see your doctor as soon as possible.  Today, you are leaving against medical advice, and we have discussed that you can have a worsening condition, permanent heart or other organ damage, or death.  Return to the emergency department if you develop severe pain, lightheadedness or fainting, fever, chest pain, shortness of breath, or any other symptoms concerning to you.

## 2017-03-10 ENCOUNTER — Other Ambulatory Visit (INDEPENDENT_AMBULATORY_CARE_PROVIDER_SITE_OTHER): Payer: Self-pay | Admitting: Vascular Surgery

## 2017-03-10 DIAGNOSIS — N185 Chronic kidney disease, stage 5: Secondary | ICD-10-CM

## 2017-03-11 ENCOUNTER — Ambulatory Visit (INDEPENDENT_AMBULATORY_CARE_PROVIDER_SITE_OTHER): Payer: Medicare Other

## 2017-03-11 ENCOUNTER — Ambulatory Visit (INDEPENDENT_AMBULATORY_CARE_PROVIDER_SITE_OTHER): Payer: Medicare Other | Admitting: Vascular Surgery

## 2017-03-11 VITALS — BP 178/99 | HR 86 | Resp 17 | Ht 71.0 in | Wt 243.0 lb

## 2017-03-11 DIAGNOSIS — N185 Chronic kidney disease, stage 5: Secondary | ICD-10-CM

## 2017-03-11 DIAGNOSIS — K219 Gastro-esophageal reflux disease without esophagitis: Secondary | ICD-10-CM | POA: Diagnosis not present

## 2017-03-11 DIAGNOSIS — I10 Essential (primary) hypertension: Secondary | ICD-10-CM | POA: Diagnosis not present

## 2017-03-11 DIAGNOSIS — I5023 Acute on chronic systolic (congestive) heart failure: Secondary | ICD-10-CM | POA: Diagnosis not present

## 2017-03-11 DIAGNOSIS — N186 End stage renal disease: Secondary | ICD-10-CM | POA: Diagnosis not present

## 2017-03-14 NOTE — Progress Notes (Signed)
MRN : [id number]  Scott [name] is a 56 y.o. (08/18/1961) male who presents with chief complaint of  Chief Complaint  Patient presents with  . Venous Insufficiency    Vein mapping  .  History of Present Illness:  The patient is seen for evaluation of dialysis access.  The patient has recently started dialysis.  There have not been accesses in both arms or in the thighs.    Current access is via a catheter which is functioning well.  The patient denies amaurosis fugax or recent TIA symptoms. There are no recent neurological changes noted. The patient denies claudication symptoms or rest pain symptoms. The patient denies history of DVT, PE or superficial thrombophlebitis. The patient denies recent episodes of angina or shortness of breath.    Current Meds  Medication Sig  . albuterol (PROVENTIL HFA;VENTOLIN HFA) 108 (90 BASE) MCG/ACT inhaler Inhale 2 puffs into the lungs every 6 (six) hours as needed for wheezing or shortness of breath.  [name] aspirin EC 81 MG tablet Take 1 tablet (81 mg total) by mouth daily.  [name] atorvastatin (LIPITOR) 80 MG tablet Take 1 tablet (80 mg total) by mouth daily at 6 PM.  . carvedilol (COREG) 25 MG tablet Take 1 tablet (25 mg total) by mouth 2 (two) times daily with a meal.  . cetirizine (ZYRTEC) 10 MG tablet Take 10 mg by mouth daily.  . Cholecalciferol (VITAMIN D) 2000 units tablet Take 2,000 Units by mouth daily.  [name] docusate sodium (COLACE) 100 MG capsule Take 100 mg by mouth 2 (two) times daily.  [name] exenatide (BYETTA) 5 MCG/0.02ML SOPN injection Inject 5 mcg into the skin 2 (two) times daily with a meal.  . insulin aspart protamine- aspart (NOVOLOG MIX 70/30) (70-30) 100 UNIT/ML injection Inject 0.35 mLs (35 Units total) into the skin 2 (two) times daily with a meal. (Patient taking differently: Inject 30 Units into the skin 2 (two) times daily with a meal. )  . isosorbide-hydrALAZINE (BIDIL) 20-37.5 MG per tablet Take 1 tablet by mouth 3 (three) times  daily.  [name] lisinopril (PRINIVIL,ZESTRIL) 20 MG tablet Take 20 mg by mouth daily.  . metoCLOPramide (REGLAN) 5 MG tablet Take 5 mg by mouth 3 (three) times daily.  . mirtazapine (REMERON) 15 MG tablet   . nitroGLYCERIN (NITROSTAT) 0.4 MG SL tablet Place 1 tablet (0.4 mg total) under the tongue every 5 (five) minutes x 3 doses as needed for chest pain.  [name] omeprazole (PRILOSEC) 20 MG capsule Take 1 capsule (20 mg total) by mouth daily.  . pioglitazone (ACTOS) 15 MG tablet Take 15 mg by mouth daily.  . prasugrel (EFFIENT) 10 MG TABS tablet Take 1 tablet (10 mg total) by mouth daily.    Past Medical History:  Diagnosis Date  . Arthritis    "left arm; right leg" (12/13/2014)  . Asthma   . CHF (congestive heart failure) ([place])   . Chronic disease anemia    [name] 12/13/2014  . Chronic kidney disease (CKD), stage IV (severe) ([place])    [name] 12/13/2014  . Coronary artery disease    [name] 12/13/2014  . Depression   . Dysrhythmia   . GERD (gastroesophageal reflux disease)   . High cholesterol    [name] 12/13/2014  . Hypertension   . Non-Q wave myocardial infarction ([place])    [name] 12/13/2014  . PVD (peripheral vascular disease) ([place])    [name] 12/13/2014  . Type II diabetes mellitus (HCC)     Past  Surgical History:  Procedure Laterality Date  . CARDIAC CATHETERIZATION  11/2014  . DIALYSIS/PERMA CATHETER INSERTION N/A 01/18/2017   Procedure: Dialysis/Perma Catheter Insertion;  Surgeon: Algernon Huxley, MD;  Location: [place] [facility];  Service: Cardiovascular;  Laterality: N/A;  . INCISION AND DRAINAGE OF WOUND Right ~ 10/2014   "4th toe foot"  . PERCUTANEOUS CORONARY STENT INTERVENTION (PCI-S) N/A 12/14/2014   Procedure: PERCUTANEOUS CORONARY STENT INTERVENTION (PCI-S);  Surgeon: Charolette Forward, MD;  Location: [facility] CATH LAB;  Service: Cardiovascular;  Laterality: N/A;  . PERCUTANEOUS CORONARY STENT INTERVENTION (PCI-S) N/A 12/18/2014   Procedure: PERCUTANEOUS CORONARY STENT INTERVENTION (PCI-S);   Surgeon: Charolette Forward, MD;  Location: [facility] CATH LAB;  Service: Cardiovascular;  Laterality: N/A;  . TOE AMPUTATION Right 2015   4th toe    Social History Social History  Substance Use Topics  . Smoking status: Former Smoker    Packs/day: 0.00    Years: 30.00    Types: Cigarettes    Start date: 08/31/1984    Quit date: 11/30/2014  . Smokeless tobacco: Never Used  . Alcohol use Yes     Comment: Rarely, twice a year.    Family History Family History  Problem Relation Age of Onset  . Cancer Mother        Lung  . Cancer Father        Lung  . Diabetes Sister   . Diabetes Brother   . CAD Brother   . Hernia Son   . Cancer Brother     No Known Allergies   REVIEW OF SYSTEMS (Negative unless checked)  Constitutional: [] Weight loss  [] Fever  [] Chills Cardiac: [] Chest pain   [] Chest pressure   [] Palpitations   [] Shortness of breath when laying flat   [] Shortness of breath with exertion. Vascular:  [] Pain in legs with walking   [] Pain in legs at rest  [] History of DVT   [] Phlebitis   [] Swelling in legs   [] Varicose veins   [] Non-healing ulcers Pulmonary:   [] Uses home oxygen   [] Productive cough   [] Hemoptysis   [] Wheeze  [] COPD   [] Asthma Neurologic:  [] Dizziness   [] Seizures   [] History of stroke   [] History of TIA  [] Aphasia   [] Vissual changes   [] Weakness or numbness in arm   [] Weakness or numbness in leg Musculoskeletal:   [] Joint swelling   [] Joint pain   [] Low back pain Hematologic:  [] Easy bruising  [] Easy bleeding   [] Hypercoagulable state   [] Anemic Gastrointestinal:  [] Diarrhea   [] Vomiting  [] Gastroesophageal reflux/heartburn   [] Difficulty swallowing. Genitourinary:  [x] Chronic kidney disease   [] Difficult urination  [] Frequent urination   [] Blood in urine Skin:  [] Rashes   [] Ulcers  Psychological:  [] History of anxiety   []  History of major depression.  Physical Examination  Vitals:   03/11/17 1640  BP: (!) 178/99  Pulse: 86  Resp: 17  Weight: 243 lb (110.2 kg)    Height: 5\' 11"  (1.803 m)   Body mass index is 33.89 kg/m. Gen: WD/WN, NAD Head: /AT, No temporalis wasting.  Ear/Nose/Throat: Hearing grossly intact, nares w/o erythema or drainage Eyes: PER, EOMI, sclera nonicteric.  Neck: Supple, no large masses.   Pulmonary:  Good air movement, no audible wheezing bilaterally, no use of accessory muscles.  Cardiac: RRR, no JVD Vascular: right IJ cath Vessel Right Left  Radial Palpable Palpable  Ulnar Palpable Palpable  Brachial Palpable Palpable  Carotid Palpable Palpable  Gastrointestinal: Non-distended. No guarding/no peritoneal signs.  Musculoskeletal: M/S  5/5 throughout.  No deformity or atrophy.  Neurologic: CN 2-12 intact. Symmetrical.  Speech is fluent. Motor exam as listed above. Psychiatric: Judgment intact, Mood & affect appropriate for pt's clinical situation. Dermatologic: No rashes or ulcers noted.  No changes consistent with cellulitis. Lymph : No lichenification or skin changes of chronic lymphedema.  CBC Lab Results  Component Value Date   WBC 8.8 02/10/2017   HGB 9.8 (L) 02/10/2017   HCT 28.7 (L) 02/10/2017   MCV 91.9 02/10/2017   PLT 325 02/10/2017    BMET    Component Value Date/Time   NA 135 02/10/2017 1847   NA 140 04/16/2015   NA 140 12/13/2014 0102   K 3.0 (L) 02/10/2017 1847   K 4.6 12/13/2014 0102   CL 95 (L) 02/10/2017 1847   CL 111 12/13/2014 0102   CO2 31 02/10/2017 1847   CO2 23 12/13/2014 0102   GLUCOSE 294 (H) 02/10/2017 1847   GLUCOSE 213 (H) 12/13/2014 0102   BUN 13 02/10/2017 1847   BUN 44 (A) 04/16/2015   BUN 25 (H) 12/13/2014 0102   CREATININE 3.01 (H) 02/10/2017 1847   CREATININE 1.95 (H) 12/13/2014 0102   CALCIUM 8.2 (L) 02/10/2017 1847   CALCIUM 8.0 (L) 12/13/2014 0102   GFRNONAA 21 (L) 02/10/2017 1847   GFRNONAA 37 (L) 12/13/2014 0102   GFRAA 25 (L) 02/10/2017 1847   GFRAA 43 (L) 12/13/2014 0102   CrCl cannot be calculated (Patient's most recent lab result is older than the  maximum 21 days allowed.).  COAG Lab Results  Component Value Date   INR 1.08 01/10/2015   INR 1.10 12/14/2014   INR 0.9 12/01/2014    Radiology No results found.   Assessment/Plan 1. End stage renal disease ([place]) Recommend:  At this time the patient does not have appropriate extremity access for dialysis.  He is interested in PD but has been told that he should also have a fistula.  Vein mapping shows a good right arm antecubital vein and he is left handed.  Patient should have a right brachial cephalic fistula created.  In addition he wishes to have a PD catheter placed as well.  The risks, benefits and alternative therapies were reviewed in detail with the patient.  All questions were answered.  The patient agrees to proceed with surgery.    2. Acute on chronic systolic CHF (congestive heart failure) (HCC) Continue cardiac and antihypertensive medications as already ordered and reviewed, no changes at this time.  Continue statin as ordered and reviewed, no changes at this time  Nitrates PRN for chest pain   3. Essential hypertension Continue antihypertensive medications as already ordered, these medications have been reviewed and there are no changes at this time.   4. Gastroesophageal reflux disease, esophagitis presence not specified Continue PPI as already ordered, these medications have been reviewed and there are no changes at this time.     Hortencia Pilar, MD  03/14/2017 7:22 PM

## 2017-03-17 ENCOUNTER — Encounter (INDEPENDENT_AMBULATORY_CARE_PROVIDER_SITE_OTHER): Payer: Self-pay

## 2017-03-18 ENCOUNTER — Other Ambulatory Visit: Payer: Self-pay | Admitting: Vascular Surgery

## 2017-03-18 ENCOUNTER — Other Ambulatory Visit (INDEPENDENT_AMBULATORY_CARE_PROVIDER_SITE_OTHER): Payer: Self-pay

## 2017-03-25 ENCOUNTER — Inpatient Hospital Stay: Admission: RE | Admit: 2017-03-25 | Payer: Medicare Other | Source: Ambulatory Visit

## 2017-03-25 ENCOUNTER — Encounter
Admission: RE | Admit: 2017-03-25 | Discharge: 2017-03-25 | Disposition: A | Discharge status: Home / Self Care | Payer: Medicare Other | Source: Ambulatory Visit | Attending: Vascular Surgery | Admitting: Vascular Surgery

## 2017-03-25 DIAGNOSIS — I132 Hypertensive heart and chronic kidney disease with heart failure and with stage 5 chronic kidney disease, or end stage renal disease: Secondary | ICD-10-CM | POA: Diagnosis not present

## 2017-03-25 DIAGNOSIS — I5023 Acute on chronic systolic (congestive) heart failure: Secondary | ICD-10-CM | POA: Diagnosis not present

## 2017-03-25 DIAGNOSIS — Z801 Family history of malignant neoplasm of trachea, bronchus and lung: Secondary | ICD-10-CM | POA: Diagnosis not present

## 2017-03-25 DIAGNOSIS — K219 Gastro-esophageal reflux disease without esophagitis: Secondary | ICD-10-CM | POA: Diagnosis not present

## 2017-03-25 DIAGNOSIS — Z87891 Personal history of nicotine dependence: Secondary | ICD-10-CM | POA: Insufficient documentation

## 2017-03-25 DIAGNOSIS — Z9889 Other specified postprocedural states: Secondary | ICD-10-CM | POA: Insufficient documentation

## 2017-03-25 DIAGNOSIS — Z809 Family history of malignant neoplasm, unspecified: Secondary | ICD-10-CM | POA: Insufficient documentation

## 2017-03-25 DIAGNOSIS — N186 End stage renal disease: Secondary | ICD-10-CM | POA: Diagnosis not present

## 2017-03-25 DIAGNOSIS — Z8249 Family history of ischemic heart disease and other diseases of the circulatory system: Secondary | ICD-10-CM | POA: Insufficient documentation

## 2017-03-25 DIAGNOSIS — Z833 Family history of diabetes mellitus: Secondary | ICD-10-CM | POA: Diagnosis not present

## 2017-03-25 DIAGNOSIS — Z01812 Encounter for preprocedural laboratory examination: Secondary | ICD-10-CM | POA: Diagnosis present

## 2017-03-25 LAB — COMPREHENSIVE METABOLIC PANEL
ALBUMIN: 3.2 g/dL — AB (ref 3.5–5.0)
ALK PHOS: 93 U/L (ref 38–126)
ALT: 15 U/L — AB (ref 17–63)
AST: 16 U/L (ref 15–41)
Anion gap: 11 (ref 5–15)
BUN: 35 mg/dL — ABNORMAL HIGH (ref 6–20)
CALCIUM: 8.3 mg/dL — AB (ref 8.9–10.3)
CHLORIDE: 101 mmol/L (ref 101–111)
CO2: 30 mmol/L (ref 22–32)
CREATININE: 4.94 mg/dL — AB (ref 0.61–1.24)
GFR calc Af Amer: 14 mL/min — ABNORMAL LOW (ref >60)
GFR calc non Af Amer: 12 mL/min — ABNORMAL LOW (ref >60)
GLUCOSE: 164 mg/dL — AB (ref 65–99)
Potassium: 4.1 mmol/L (ref 3.5–5.1)
SODIUM: 142 mmol/L (ref 135–145)
Total Bilirubin: 0.4 mg/dL (ref 0.3–1.2)
Total Protein: 6.7 g/dL (ref 6.5–8.1)

## 2017-03-25 LAB — CBC
HCT: 26.3 % — ABNORMAL LOW (ref 40.0–52.0)
Hemoglobin: 8.9 g/dL — ABNORMAL LOW (ref 13.0–18.0)
MCH: 31.5 pg (ref 26.0–34.0)
MCHC: 33.9 g/dL (ref 32.0–36.0)
MCV: 92.8 fL (ref 80.0–100.0)
Platelets: 336 10*3/uL (ref 150–440)
RBC: 2.84 MIL/uL — ABNORMAL LOW (ref 4.40–5.90)
RDW: 13.7 % (ref 11.5–14.5)
WBC: 8.7 10*3/uL (ref 3.8–10.6)

## 2017-03-25 LAB — DIFFERENTIAL
Basophils Absolute: 0.1 10*3/uL (ref 0–0.1)
Basophils Relative: 1 %
EOS PCT: 5 %
Eosinophils Absolute: 0.4 10*3/uL (ref 0–0.7)
LYMPHS ABS: 1.9 10*3/uL (ref 1.0–3.6)
LYMPHS PCT: 22 %
Monocytes Absolute: 0.7 10*3/uL (ref 0.2–1.0)
Monocytes Relative: 8 %
NEUTROS ABS: 5.6 10*3/uL (ref 1.4–6.5)
NEUTROS PCT: 64 %

## 2017-03-25 LAB — SURGICAL PCR SCREEN
MRSA, PCR: NEGATIVE
Staphylococcus aureus: NEGATIVE

## 2017-03-25 LAB — TYPE AND SCREEN
ABO/RH(D): O POS
Antibody Screen: NEGATIVE

## 2017-03-25 LAB — PROTIME-INR
INR: 0.99
Prothrombin Time: 13.1 seconds (ref 11.4–15.2)

## 2017-03-25 LAB — APTT: aPTT: 30 seconds (ref 24–36)

## 2017-03-25 NOTE — Pre-Procedure Instructions (Signed)
Spoke with Mickel Baas from Dr. Nino Parsley office regarding prasugrel (EFFIENT) if and when to stop prior to surgery.  She asked Dr. [name], he wants pt to stop the prasugrel (EFFIENT) 3 days prior to surgery. See pre-op instructions.

## 2017-03-25 NOTE — Pre-Procedure Instructions (Signed)
EKG  ED ECG REPORT I, Eula Listen, the attending physician, personally viewed and interpreted this ECG.   Date: 02/10/2017  EKG Time: 1845  Rate: 85  Rhythm: normal sinus rhythm  Axis: leftward  Intervals:prolonged QTc  ST&T Change: No STEMI. T-wave inversion in V5 and V6 which is new compared to 01/17/17

## 2017-03-25 NOTE — Patient Instructions (Addendum)
  Your procedure is scheduled on:Wednesday April 07, 2017. Report to Same Day Surgery. To find out your arrival time please call [phone] between 1PM - 3PM on Tuesday April 06, 2017 .  Remember: Instructions that are not followed completely may result in serious medical risk, up to and including death, or upon the discretion of your surgeon and anesthesiologist your surgery may need to be rescheduled.    _x___ 1. Do not eat food or drink liquids after midnight. No gum chewing or hard candies.     ____ 2. No Alcohol for 24 hours before or after surgery.   ____ 3. Bring all medications with you on the day of surgery if instructed.    __x__ 4. Notify your doctor if there is any change in your medical condition     (cold, fever, infections).    _____ 5. No smoking 24 hours prior to surgery.     Do not wear jewelry, make-up, hairpins, clips or nail polish.  Do not wear lotions, powders, or perfumes.   Do not shave 48 hours prior to surgery. Men may shave face and neck.  Do not bring valuables to the hospital.    [facility] is not responsible for any belongings or valuables.               Contacts, dentures or bridgework may not be worn into surgery.  Leave your suitcase in the car. After surgery it may be brought to your room.  For patients admitted to the hospital, discharge time is determined by your treatment team.   Patients discharged the day of surgery will not be allowed to drive home.    Please read over the following fact sheets that you were given:   [facility] Preparing for Surgery  __x__ Take these medicines the morning of surgery with A SIP OF WATER:    1. carvedilol (COREG)  2. lisinopril (PRINIVIL,ZESTRIL)  3. metoCLOPramide (REGLAN)   4. omeprazole (PRILOSEC)   5.     ____ Fleet Enema (as directed)   _x___ Use CHG Soap as directed on instruction sheet  _x___ Use inhalers on the day of surgery and bring to hospital day of surgery  ____ Stop metformin 2  days prior to surgery    ____ Take 1/2 of usual insulin dose the night before surgery and none on the morning of surgery.   _x___ Stop prasugrel (EFFIENT) 3 days prior to surgery .  ____ Stop Anti-inflammatories such as Advil, Aleve, Ibuprofen, Motrin, Naproxen,  Naprosyn, Goodies powders or aspirin  products. OK to take Tylenol.   ____ Stop supplements until after surgery.    ____ Bring C-Pap to the hospital.

## 2017-03-29 ENCOUNTER — Ambulatory Visit: Payer: Medicare Other | Admitting: Family

## 2017-04-06 MED ORDER — CEFAZOLIN SODIUM-DEXTROSE 2-4 GM/100ML-% IV SOLN
2.0000 g | Freq: Once | INTRAVENOUS | Status: AC
  Administered 2017-04-07: 2 g via INTRAVENOUS

## 2017-04-07 ENCOUNTER — Encounter
Admission: RE | Disposition: A | Discharge status: Home / Self Care | Payer: Self-pay | Source: Ambulatory Visit | Attending: Vascular Surgery

## 2017-04-07 ENCOUNTER — Ambulatory Visit: Payer: Medicare Other | Admitting: Anesthesiology

## 2017-04-07 ENCOUNTER — Ambulatory Visit
Admission: RE | Admit: 2017-04-07 | Discharge: 2017-04-07 | Disposition: A | Discharge status: Home / Self Care | Payer: Medicare Other | Source: Ambulatory Visit | Attending: Vascular Surgery | Admitting: Vascular Surgery

## 2017-04-07 ENCOUNTER — Encounter: Payer: Self-pay | Admitting: *Deleted

## 2017-04-07 DIAGNOSIS — I252 Old myocardial infarction: Secondary | ICD-10-CM | POA: Insufficient documentation

## 2017-04-07 DIAGNOSIS — Z87891 Personal history of nicotine dependence: Secondary | ICD-10-CM | POA: Insufficient documentation

## 2017-04-07 DIAGNOSIS — M1990 Unspecified osteoarthritis, unspecified site: Secondary | ICD-10-CM | POA: Diagnosis not present

## 2017-04-07 DIAGNOSIS — J45909 Unspecified asthma, uncomplicated: Secondary | ICD-10-CM | POA: Diagnosis not present

## 2017-04-07 DIAGNOSIS — E1122 Type 2 diabetes mellitus with diabetic chronic kidney disease: Secondary | ICD-10-CM | POA: Insufficient documentation

## 2017-04-07 DIAGNOSIS — Z992 Dependence on renal dialysis: Secondary | ICD-10-CM | POA: Diagnosis not present

## 2017-04-07 DIAGNOSIS — I509 Heart failure, unspecified: Secondary | ICD-10-CM | POA: Insufficient documentation

## 2017-04-07 DIAGNOSIS — E1151 Type 2 diabetes mellitus with diabetic peripheral angiopathy without gangrene: Secondary | ICD-10-CM | POA: Diagnosis not present

## 2017-04-07 DIAGNOSIS — K219 Gastro-esophageal reflux disease without esophagitis: Secondary | ICD-10-CM | POA: Diagnosis not present

## 2017-04-07 DIAGNOSIS — D631 Anemia in chronic kidney disease: Secondary | ICD-10-CM | POA: Insufficient documentation

## 2017-04-07 DIAGNOSIS — I2510 Atherosclerotic heart disease of native coronary artery without angina pectoris: Secondary | ICD-10-CM | POA: Insufficient documentation

## 2017-04-07 DIAGNOSIS — F329 Major depressive disorder, single episode, unspecified: Secondary | ICD-10-CM | POA: Diagnosis not present

## 2017-04-07 DIAGNOSIS — I132 Hypertensive heart and chronic kidney disease with heart failure and with stage 5 chronic kidney disease, or end stage renal disease: Secondary | ICD-10-CM | POA: Insufficient documentation

## 2017-04-07 DIAGNOSIS — Z955 Presence of coronary angioplasty implant and graft: Secondary | ICD-10-CM | POA: Insufficient documentation

## 2017-04-07 DIAGNOSIS — N186 End stage renal disease: Secondary | ICD-10-CM | POA: Insufficient documentation

## 2017-04-07 HISTORY — PX: CAPD INSERTION: SHX5233

## 2017-04-07 HISTORY — PX: AV FISTULA PLACEMENT: SHX1204

## 2017-04-07 LAB — POCT I-STAT 4, (NA,K, GLUC, HGB,HCT)
GLUCOSE: 200 mg/dL — AB (ref 65–99)
HEMATOCRIT: 25 % — AB (ref 39.0–52.0)
HEMOGLOBIN: 8.5 g/dL — AB (ref 13.0–17.0)
POTASSIUM: 4.4 mmol/L (ref 3.5–5.1)
SODIUM: 140 mmol/L (ref 135–145)

## 2017-04-07 LAB — GLUCOSE, CAPILLARY
GLUCOSE-CAPILLARY: 148 mg/dL — AB (ref 65–99)
Glucose-Capillary: 205 mg/dL — ABNORMAL HIGH (ref 65–99)

## 2017-04-07 LAB — ABO/RH: ABO/RH(D): O POS

## 2017-04-07 SURGERY — LAPAROSCOPIC INSERTION CONTINUOUS AMBULATORY PERITONEAL DIALYSIS  (CAPD) CATHETER
Anesthesia: General | Laterality: Right | Wound class: Clean

## 2017-04-07 MED ORDER — MIDAZOLAM HCL 2 MG/2ML IJ SOLN
INTRAMUSCULAR | Status: AC
  Filled 2017-04-07: qty 2

## 2017-04-07 MED ORDER — GLYCOPYRROLATE 0.2 MG/ML IJ SOLN
INTRAMUSCULAR | Status: AC
  Filled 2017-04-07: qty 3

## 2017-04-07 MED ORDER — HYDROCODONE-ACETAMINOPHEN 5-325 MG PO TABS
ORAL_TABLET | ORAL | Status: AC
  Administered 2017-04-07: 1 via ORAL
  Filled 2017-04-07: qty 1

## 2017-04-07 MED ORDER — LIDOCAINE HCL (CARDIAC) 20 MG/ML IV SOLN
INTRAVENOUS | Status: DC | PRN
  Administered 2017-04-07: 50 mg via INTRAVENOUS

## 2017-04-07 MED ORDER — SODIUM CHLORIDE 0.9 % IV SOLN
INTRAVENOUS | Status: DC
  Administered 2017-04-07: 10 mL/h via INTRAVENOUS

## 2017-04-07 MED ORDER — ONDANSETRON HCL 4 MG/2ML IJ SOLN
INTRAMUSCULAR | Status: AC
  Filled 2017-04-07: qty 2

## 2017-04-07 MED ORDER — CARVEDILOL 25 MG PO TABS
25.0000 mg | ORAL_TABLET | Freq: Once | ORAL | Status: AC
  Administered 2017-04-07: 25 mg via ORAL

## 2017-04-07 MED ORDER — EPHEDRINE SULFATE 50 MG/ML IJ SOLN
INTRAMUSCULAR | Status: DC | PRN
  Administered 2017-04-07 (×2): 10 mg via INTRAVENOUS

## 2017-04-07 MED ORDER — BUPIVACAINE HCL (PF) 0.25 % IJ SOLN
INTRAMUSCULAR | Status: DC | PRN
  Administered 2017-04-07: 17 mL

## 2017-04-07 MED ORDER — PHENYLEPHRINE HCL 10 MG/ML IJ SOLN
INTRAMUSCULAR | Status: DC | PRN
  Administered 2017-04-07: 50 ug via INTRAVENOUS
  Administered 2017-04-07: 100 ug via INTRAVENOUS

## 2017-04-07 MED ORDER — FENTANYL CITRATE (PF) 100 MCG/2ML IJ SOLN
INTRAMUSCULAR | Status: DC | PRN
  Administered 2017-04-07: 100 ug via INTRAVENOUS

## 2017-04-07 MED ORDER — ROCURONIUM BROMIDE 50 MG/5ML IV SOLN
INTRAVENOUS | Status: AC
  Filled 2017-04-07: qty 1

## 2017-04-07 MED ORDER — BUPIVACAINE HCL (PF) 0.5 % IJ SOLN
INTRAMUSCULAR | Status: AC
  Filled 2017-04-07: qty 30

## 2017-04-07 MED ORDER — LIDOCAINE HCL (PF) 2 % IJ SOLN
INTRAMUSCULAR | Status: AC
  Filled 2017-04-07: qty 2

## 2017-04-07 MED ORDER — EPINEPHRINE PF 1 MG/ML IJ SOLN
INTRAMUSCULAR | Status: AC
  Filled 2017-04-07: qty 1

## 2017-04-07 MED ORDER — HEPARIN SODIUM (PORCINE) 5000 UNIT/ML IJ SOLN
INTRAMUSCULAR | Status: AC
  Filled 2017-04-07: qty 1

## 2017-04-07 MED ORDER — EPHEDRINE SULFATE 50 MG/ML IJ SOLN
INTRAMUSCULAR | Status: AC
Start: 2017-04-07 — End: ?
  Filled 2017-04-07: qty 1

## 2017-04-07 MED ORDER — PAPAVERINE HCL 30 MG/ML IJ SOLN
INTRAMUSCULAR | Status: AC
  Filled 2017-04-07: qty 2

## 2017-04-07 MED ORDER — SUCCINYLCHOLINE CHLORIDE 20 MG/ML IJ SOLN
INTRAMUSCULAR | Status: DC | PRN
  Administered 2017-04-07: 100 mg via INTRAVENOUS

## 2017-04-07 MED ORDER — CARVEDILOL 25 MG PO TABS
ORAL_TABLET | ORAL | Status: AC
  Filled 2017-04-07: qty 1

## 2017-04-07 MED ORDER — BUPIVACAINE HCL (PF) 0.25 % IJ SOLN
INTRAMUSCULAR | Status: AC
  Filled 2017-04-07: qty 30

## 2017-04-07 MED ORDER — SODIUM CHLORIDE 0.9 % IV SOLN
INTRAVENOUS | Status: DC | PRN
  Administered 2017-04-07: 25 ug/min via INTRAVENOUS

## 2017-04-07 MED ORDER — FENTANYL CITRATE (PF) 100 MCG/2ML IJ SOLN
INTRAMUSCULAR | Status: AC
  Filled 2017-04-07: qty 2

## 2017-04-07 MED ORDER — ROCURONIUM BROMIDE 100 MG/10ML IV SOLN
INTRAVENOUS | Status: DC | PRN
  Administered 2017-04-07: 15 mg via INTRAVENOUS
  Administered 2017-04-07: 25 mg via INTRAVENOUS
  Administered 2017-04-07: 10 mg via INTRAVENOUS
  Administered 2017-04-07: 15 mg via INTRAVENOUS

## 2017-04-07 MED ORDER — PROPOFOL 10 MG/ML IV BOLUS
INTRAVENOUS | Status: DC | PRN
  Administered 2017-04-07: 40 mg via INTRAVENOUS
  Administered 2017-04-07: 160 mg via INTRAVENOUS

## 2017-04-07 MED ORDER — FAMOTIDINE 20 MG PO TABS
20.0000 mg | ORAL_TABLET | Freq: Once | ORAL | Status: AC
  Administered 2017-04-07: 20 mg via ORAL

## 2017-04-07 MED ORDER — HYDROCODONE-ACETAMINOPHEN 5-325 MG PO TABS
1.0000 | ORAL_TABLET | Freq: Four times a day (QID) | ORAL | Status: DC | PRN
  Administered 2017-04-07: 1 via ORAL

## 2017-04-07 MED ORDER — MIDAZOLAM HCL 2 MG/2ML IJ SOLN
INTRAMUSCULAR | Status: DC | PRN
  Administered 2017-04-07: 2 mg via INTRAVENOUS

## 2017-04-07 MED ORDER — FAMOTIDINE 20 MG PO TABS
ORAL_TABLET | ORAL | Status: AC
  Filled 2017-04-07: qty 1

## 2017-04-07 MED ORDER — PROPOFOL 10 MG/ML IV BOLUS
INTRAVENOUS | Status: AC
  Filled 2017-04-07: qty 20

## 2017-04-07 MED ORDER — CEFAZOLIN SODIUM-DEXTROSE 2-4 GM/100ML-% IV SOLN
INTRAVENOUS | Status: AC
  Filled 2017-04-07: qty 100

## 2017-04-07 MED ORDER — NEOSTIGMINE METHYLSULFATE 10 MG/10ML IV SOLN
INTRAVENOUS | Status: AC
  Filled 2017-04-07: qty 1

## 2017-04-07 SURGICAL SUPPLY — 75 items
ADAPTER BETA CAP QUINTON DIALY (ADAPTER) ×4 IMPLANT
ADAPTER CATH DIALYSIS 18.75 (CATHETERS) ×3 IMPLANT
ADAPTER CATH DIALYSIS 18.75CM (CATHETERS) ×1
APPLIER CLIP 11 MED OPEN (CLIP)
APPLIER CLIP 9.375 SM OPEN (CLIP)
BAG DECANTER FOR FLEXI CONT (MISCELLANEOUS) ×4 IMPLANT
BLADE SURG 15 STRL LF DISP TIS (BLADE) ×2 IMPLANT
BLADE SURG 15 STRL SS (BLADE) ×2
BLADE SURG SZ11 CARB STEEL (BLADE) ×4 IMPLANT
BOOT SUTURE AID YELLOW STND (SUTURE) ×4 IMPLANT
BRUSH SCRUB 4% CHG (MISCELLANEOUS) ×4 IMPLANT
CANISTER SUCT 1200ML W/VALVE (MISCELLANEOUS) ×4 IMPLANT
CATH DLYS SWAN NECK 62.5CM (CATHETERS) ×4 IMPLANT
CHLORAPREP W/TINT 26ML (MISCELLANEOUS) ×8 IMPLANT
CLIP APPLIE 11 MED OPEN (CLIP) IMPLANT
CLIP APPLIE 9.375 SM OPEN (CLIP) IMPLANT
DERMABOND ADVANCED (GAUZE/BANDAGES/DRESSINGS) ×4
DERMABOND ADVANCED .7 DNX12 (GAUZE/BANDAGES/DRESSINGS) ×4 IMPLANT
DRESSING SURGICEL FIBRLLR 1X2 (HEMOSTASIS) IMPLANT
DRSG SURGICEL FIBRILLAR 1X2 (HEMOSTASIS)
ELECT CAUTERY BLADE 6.4 (BLADE) ×4 IMPLANT
ELECT REM PT RETURN 9FT ADLT (ELECTROSURGICAL) ×4
ELECTRODE REM PT RTRN 9FT ADLT (ELECTROSURGICAL) ×2 IMPLANT
GAUZE SPONGE 4X4 12PLY STRL (GAUZE/BANDAGES/DRESSINGS) ×4 IMPLANT
GEL ULTRASOUND 20GR AQUASONIC (MISCELLANEOUS) IMPLANT
GLOVE BIO SURGEON STRL SZ7 (GLOVE) ×4 IMPLANT
GLOVE INDICATOR 7.5 STRL GRN (GLOVE) ×4 IMPLANT
GLOVE SURG SYN 8.0 (GLOVE) ×8 IMPLANT
GOWN STRL REUS W/ TWL LRG LVL3 (GOWN DISPOSABLE) ×2 IMPLANT
GOWN STRL REUS W/ TWL XL LVL3 (GOWN DISPOSABLE) ×4 IMPLANT
GOWN STRL REUS W/TWL LRG LVL3 (GOWN DISPOSABLE) ×2
GOWN STRL REUS W/TWL XL LVL3 (GOWN DISPOSABLE) ×4
IV NS 500ML (IV SOLUTION) ×2
IV NS 500ML BAXH (IV SOLUTION) ×2 IMPLANT
KIT RM TURNOVER STRD PROC AR (KITS) ×4 IMPLANT
LABEL OR SOLS (LABEL) ×4 IMPLANT
LOOP RED MAXI  1X406MM (MISCELLANEOUS) ×2
LOOP VESSEL MAXI 1X406 RED (MISCELLANEOUS) ×2 IMPLANT
LOOP VESSEL MINI 0.8X406 BLUE (MISCELLANEOUS) ×2 IMPLANT
LOOPS BLUE MINI 0.8X406MM (MISCELLANEOUS) ×2
MINICAP W/POVIDONE IODINE SOL (MISCELLANEOUS) ×4 IMPLANT
NEEDLE FILTER BLUNT 18X 1/2SAF (NEEDLE) ×2
NEEDLE FILTER BLUNT 18X1 1/2 (NEEDLE) ×2 IMPLANT
NEEDLE HYPO 25X1 1.5 SAFETY (NEEDLE) ×4 IMPLANT
NEEDLE HYPO 30X.5 LL (NEEDLE) IMPLANT
NEEDLE INSUFFLATION 150MM (ENDOMECHANICALS) ×4 IMPLANT
NS IRRIG 500ML POUR BTL (IV SOLUTION) ×4 IMPLANT
PACK EXTREMITY ARMC (MISCELLANEOUS) ×4 IMPLANT
PACK LAP CHOLECYSTECTOMY (MISCELLANEOUS) IMPLANT
PAD PREP 24X41 OB/GYN DISP (PERSONAL CARE ITEMS) IMPLANT
PENCIL ELECTRO HAND CTR (MISCELLANEOUS) ×4 IMPLANT
PUNCH SURGICAL ROTATE 2.7MM (MISCELLANEOUS) IMPLANT
SET TRANSFER 6 W/TWIST CLAMP 5 (SET/KITS/TRAYS/PACK) ×4 IMPLANT
SLEEVE ENDOPATH XCEL 5M (ENDOMECHANICALS) ×4 IMPLANT
SPONGE XRAY 4X4 16PLY STRL (MISCELLANEOUS) ×4 IMPLANT
STOCKINETTE STRL 4IN 9604848 (GAUZE/BANDAGES/DRESSINGS) ×4 IMPLANT
SUT MNCRL AB 4-0 PS2 18 (SUTURE) ×4 IMPLANT
SUT MNCRL+ 5-0 UNDYED PC-3 (SUTURE) ×2 IMPLANT
SUT MONOCRYL 5-0 (SUTURE) ×2
SUT PROLENE 6 0 BV (SUTURE) ×16 IMPLANT
SUT SILK 2 0 (SUTURE) ×2
SUT SILK 2-0 18XBRD TIE 12 (SUTURE) ×2 IMPLANT
SUT SILK 3 0 (SUTURE) ×2
SUT SILK 3-0 18XBRD TIE 12 (SUTURE) ×2 IMPLANT
SUT SILK 4 0 (SUTURE) ×2
SUT SILK 4-0 18XBRD TIE 12 (SUTURE) ×2 IMPLANT
SUT VIC AB 3-0 SH 27 (SUTURE) ×4
SUT VIC AB 3-0 SH 27X BRD (SUTURE) ×4 IMPLANT
SUT VICRYL 0 AB UR-6 (SUTURE) ×8 IMPLANT
SYR 20CC LL (SYRINGE) ×4 IMPLANT
SYR 3ML LL SCALE MARK (SYRINGE) ×4 IMPLANT
SYR 50ML LL SCALE MARK (SYRINGE) ×4 IMPLANT
TOWEL OR 17X26 4PK STRL BLUE (TOWEL DISPOSABLE) IMPLANT
TROCAR XCEL NON-BLD 5MMX100MML (ENDOMECHANICALS) ×4 IMPLANT
TUBING INSUFFLATOR HI FLOW (MISCELLANEOUS) ×4 IMPLANT

## 2017-04-07 NOTE — H&P (Signed)
[facility] History & Physical Update  The patient was interviewed and re-examined.  The patient's previous History and Physical has been reviewed and is unchanged.  There is no change in the plan of care. We plan to proceed with the scheduled procedure.  Hortencia Pilar, MD  04/07/2017, 12:10 PM

## 2017-04-07 NOTE — Discharge Instructions (Signed)
AMBULATORY SURGERY  DISCHARGE INSTRUCTIONS   1) The drugs that you were given will stay in your system until tomorrow so for the next 24 hours you should not:  A) Drive an automobile B) Make any legal decisions C) Drink any alcoholic beverage   2) You may resume regular meals tomorrow.  Today it is better to start with liquids and gradually work up to solid foods.  You may eat anything you prefer, but it is better to start with liquids, then soup and crackers, and gradually work up to solid foods.   3) Please notify your doctor immediately if you have any unusual bleeding, trouble breathing, redness and pain at the surgery site, drainage, fever, or pain not relieved by medication.    4) Additional Instructions:        Please contact your physician with any problems or Same Day Surgery at [phone], Monday through Friday 6 am to 4 pm, or [place] at [facility] number at [phone].  Pt to call Dr Nino Parsley office in am for followup appt in 2 weeks. Diet as tolerated. Minimal activity. May shower (sponge bath) tomorrow evening. Pt advised to call Dr Hal Morales office for any problems - including drainage at incision sites, fever of 101 or above or pain not relieved by pain medication. Do not get dressing to abdomen wet.

## 2017-04-07 NOTE — Op Note (Signed)
OPERATIVE NOTE   PROCEDURE: 1. Laparoscopic peritoneal dialysis catheter placement. 2. Creation of right brachiocephalic fistula  PRE-OPERATIVE DIAGNOSIS: 1. End stage renal disease 2. Hypertension  POST-OPERATIVE DIAGNOSIS: Same  SURGEON: Hortencia Pilar  ASSISTANT(S): None  ANESTHESIA: general  ESTIMATED BLOOD LOSS: Minimal   FINDING(S): 1. None  SPECIMEN(S): None  INDICATIONS:  Scott [name] is a 56 y.o. male who presents with end-stage renal disease currently maintained on hemodialysis via a right IJ catheter. The patient has decided to do peritoneal dialysis for his long-term dialysis.  However, he also has an excellent vein and wishes to have a fistula created so that he can transition to hemodialysis if needed. Risks and benefits of placement were discussed and he is agreeable to proceed.      DESCRIPTION:  After obtaining full informed written consent, the patient was brought back to the operating room and placed supine upon the operating table. The patient received IV antibiotics prior to induction. After obtaining adequate anesthesia, the abdomen was prepped and draped in the standard fashion.   A small vertical incision was made in the midline just above the umbilicus and the dissection was carried down through the soft tissue to expose the fascia. Two 0 Vicryl sutures were then used to secure the fascia just lateral to the midline on both sides and an 15 blade was used to incise the fascia. The fascia is then elevated with a bone hook Varies needle is introduced and a saline test was performed indicating intraperitoneal location and insufflated of the abdomen with carbon dioxide is performed to 15 mmHg pressure. A 5 mm trocar is then placed again maintaining elevation of the fascia with a bone hook.  A oblique incision is then made in the left lower quadrant to the lateral portion of the rectus muscle the dissection is carried down through the soft tissues and  the fascia is exposed. Small incision is made in the fascia with the Bovie and a pursestring suture of 0 Vicryl is placed. Upon initial attempt of placing the [name] needle the adhesions are obstructing the catheter positioning and view of the placement and therefore they must be removed.  [name] needle was then inserted under direct visualization and the wire introduced under the view of the camera trocar was then placed and the catheter was advanced into the abdominal cavity over the trocar. Under direct visualization the catheters curled portion was positioned deep into the pelvis just to the right of midline. It was irrigated with heparinized saline. The deep cuff was secured to the fascial pursestring suture. A small counterincision was made in the right upper quadrant of the abdomen and the catheter was brought out this site. The appropriate distal connectors were placed, and I then placed 300 cc of saline through the catheter into the pelvis. The abdomen was desufflated. Immediately, 250 cc of effluent returned through the catheter when the bag was placed to gravity. I took one more look with the camera to ensure that the catheter was in the pelvis and it was. The hasson trocar was then removed. I then closed the incisions with a 2-0 Vicryl in the right lower quadrant incision and subsequently 3-0 Vicryl and 4-0 Monocryl, the other incisions were closed with 3-0 Vicryl and 4-0 Monocryl and placed Dermabond as dressing. Dry dressing was placed around the catheter exit site.   The patient was then reprepped and draped in the standard fashion for a right arm access procedure.    A curvilinear incision  was then created midway between the radial impulse and the cephalic vein. The cephalic vein was then identified and dissected circumferentially. It was marked with a surgical marker.    Attention was then turned to the brachial artery which was exposed through the same incision and looped proximally  and distally. Side branches were controlled with 4-0 silk ties.  The distal segment of the vein was ligated with a  2-0 silk, and the vein was transected.  The proximal segment was interrogated with serial dilators.  The vein accepted up to a 4 mm dilator without any difficulty. Heparinized saline was infused into the vein and clamped it with a small bulldog.  At this point, I reset my exposure of the brachial artery and controlled the artery with vessel loops proximally and distally.  An arteriotomy was then made with a #11 blade, and extended with a Potts scissor.  Heparinized saline was injected proximal and distal into the radial artery.  The vein was then approximated to the artery while the artery was in its native bed and subsequently the vein was beveled using Potts scissors. The vein was then sewn to the artery in an end-to-side configuration with a running stitch of 6-0 Prolene.  Prior to completing this anastomosis Flushing maneuvers were performed and the artery was allowed to forward and back bleed.  There was no evidence of clot from any vessels.  I completed the anastomosis in the usual fashion and then released all vessel loops and clamps.    There was good  thrill in the venous outflow, and there was 1+ palpable radial pulse.  At this point, I irrigated out the surgical wound.  There was no further active bleeding.  The subcutaneous tissue was reapproximated with a running stitch of 3-0 Vicryl.  The skin was then reapproximated with a running subcuticular stitch of 4-0 Vicryl.  The skin was then cleaned, dried, and reinforced with Dermabond.    The patient tolerated this procedure well.    The patient was then awakened from anesthesia and taken to the recovery room in stable condition having tolerated the procedure well.   COMPLICATIONS: None  CONDITION: None  Hortencia Pilar 04/07/2017, 5:53 PM

## 2017-04-07 NOTE — OR Nursing (Signed)
Patient states that he took 32 units of insulin this morning at 9:00 am.  His sugar at home was 264.  He would have normally taken 42 units to lower this level. Feels okay this morning.

## 2017-04-07 NOTE — Anesthesia Preprocedure Evaluation (Signed)
Anesthesia Evaluation  Patient identified by MRN, date of birth, ID band Patient awake    Reviewed: Allergy & Precautions, NPO status , Patient's Chart, lab work & pertinent test results, reviewed documented beta blocker date and time   Airway Mallampati: III  TM Distance: >3 FB     Dental  (+) Chipped   Pulmonary asthma , former smoker,           Cardiovascular hypertension, Pt. on medications and Pt. on home beta blockers + CAD, + Past MI, + Cardiac Stents, + Peripheral Vascular Disease and +CHF  + dysrhythmias      Neuro/Psych PSYCHIATRIC DISORDERS Depression    GI/Hepatic GERD  Controlled,  Endo/Other  diabetes, Type 2  Renal/GU ESRFRenal disease     Musculoskeletal  (+) Arthritis ,   Abdominal   Peds  Hematology  (+) anemia ,   Anesthesia Other Findings Hypertensive BPs 185/93.  Reproductive/Obstetrics                             Anesthesia Physical Anesthesia Plan  ASA: III  Anesthesia Plan: General   Post-op Pain Management:    Induction: Intravenous  PONV Risk Score and Plan:   Airway Management Planned: Oral ETT  Additional Equipment:   Intra-op Plan:   Post-operative Plan:   Informed Consent: I have reviewed the patients History and Physical, chart, labs and discussed the procedure including the risks, benefits and alternatives for the proposed anesthesia with the patient or authorized representative who has indicated his/her understanding and acceptance.     Plan Discussed with: CRNA  Anesthesia Plan Comments:         Anesthesia Quick Evaluation

## 2017-04-07 NOTE — Anesthesia Procedure Notes (Signed)
Procedure Name: Intubation Date/Time: 04/07/2017 12:49 PM Performed by: Darlyne Russian Pre-anesthesia Checklist: Patient identified, Emergency Drugs available, Suction available, Patient being monitored and Timeout performed Patient Re-evaluated:Patient Re-evaluated prior to induction Oxygen Delivery Method: Circle system utilized Preoxygenation: Pre-oxygenation with 100% oxygen Induction Type: IV induction Ventilation: Mask ventilation without difficulty Laryngoscope Size: Mac and 3 Grade View: Grade II Tube type: Oral Tube size: 7.5 mm Number of attempts: 1 Placement Confirmation: ETT inserted through vocal cords under direct vision,  positive ETCO2 and breath sounds checked- equal and bilateral Secured at: 21 cm Tube secured with: Tape Dental Injury: Teeth and Oropharynx as per pre-operative assessment

## 2017-04-07 NOTE — OR Nursing (Signed)
Patient states that he is "feeling bad" after dialysis.  He thinks that they are drying him out too much.  His renal labs have changed dramatically from 30 days ago to 13 days ago. Patient to talk to his kidney doctor to discuss this.

## 2017-04-08 ENCOUNTER — Telehealth (INDEPENDENT_AMBULATORY_CARE_PROVIDER_SITE_OTHER): Payer: Self-pay

## 2017-04-08 ENCOUNTER — Emergency Department
Admission: EM | Admit: 2017-04-08 | Discharge: 2017-04-09 | Disposition: A | Discharge status: Home / Self Care | Payer: Medicare Other | Source: Home / Self Care | Attending: Emergency Medicine | Admitting: Emergency Medicine

## 2017-04-08 ENCOUNTER — Encounter: Payer: Self-pay | Admitting: Emergency Medicine

## 2017-04-08 ENCOUNTER — Encounter: Payer: Self-pay | Admitting: Vascular Surgery

## 2017-04-08 ENCOUNTER — Emergency Department
Admission: EM | Admit: 2017-04-08 | Discharge: 2017-04-08 | Disposition: A | Discharge status: Home / Self Care | Payer: Medicare Other | Attending: Emergency Medicine | Admitting: Emergency Medicine

## 2017-04-08 DIAGNOSIS — Z87891 Personal history of nicotine dependence: Secondary | ICD-10-CM | POA: Insufficient documentation

## 2017-04-08 DIAGNOSIS — I5042 Chronic combined systolic (congestive) and diastolic (congestive) heart failure: Secondary | ICD-10-CM | POA: Insufficient documentation

## 2017-04-08 DIAGNOSIS — I5023 Acute on chronic systolic (congestive) heart failure: Secondary | ICD-10-CM | POA: Diagnosis not present

## 2017-04-08 DIAGNOSIS — T839XXA Unspecified complication of genitourinary prosthetic device, implant and graft, initial encounter: Secondary | ICD-10-CM

## 2017-04-08 DIAGNOSIS — E119 Type 2 diabetes mellitus without complications: Secondary | ICD-10-CM | POA: Insufficient documentation

## 2017-04-08 DIAGNOSIS — Z7982 Long term (current) use of aspirin: Secondary | ICD-10-CM | POA: Insufficient documentation

## 2017-04-08 DIAGNOSIS — I132 Hypertensive heart and chronic kidney disease with heart failure and with stage 5 chronic kidney disease, or end stage renal disease: Secondary | ICD-10-CM | POA: Insufficient documentation

## 2017-04-08 DIAGNOSIS — I2510 Atherosclerotic heart disease of native coronary artery without angina pectoris: Secondary | ICD-10-CM | POA: Insufficient documentation

## 2017-04-08 DIAGNOSIS — N186 End stage renal disease: Secondary | ICD-10-CM | POA: Diagnosis not present

## 2017-04-08 DIAGNOSIS — E1122 Type 2 diabetes mellitus with diabetic chronic kidney disease: Secondary | ICD-10-CM | POA: Insufficient documentation

## 2017-04-08 DIAGNOSIS — T8384XA Pain from genitourinary prosthetic devices, implants and grafts, initial encounter: Secondary | ICD-10-CM | POA: Insufficient documentation

## 2017-04-08 DIAGNOSIS — Y69 Unspecified misadventure during surgical and medical care: Secondary | ICD-10-CM

## 2017-04-08 DIAGNOSIS — R339 Retention of urine, unspecified: Secondary | ICD-10-CM | POA: Diagnosis present

## 2017-04-08 DIAGNOSIS — Z794 Long term (current) use of insulin: Secondary | ICD-10-CM | POA: Insufficient documentation

## 2017-04-08 DIAGNOSIS — Z79899 Other long term (current) drug therapy: Secondary | ICD-10-CM | POA: Insufficient documentation

## 2017-04-08 DIAGNOSIS — J45909 Unspecified asthma, uncomplicated: Secondary | ICD-10-CM | POA: Insufficient documentation

## 2017-04-08 MED ORDER — LIDOCAINE HCL 2 % EX GEL
1.0000 "application " | Freq: Once | CUTANEOUS | Status: AC
  Administered 2017-04-09: 1 via URETHRAL
  Filled 2017-04-08: qty 5

## 2017-04-08 NOTE — Transfer of Care (Signed)
Immediate Anesthesia Transfer of Care Note  Patient: Scott [name]  Procedure(s) Performed: Procedure(s): LAPAROSCOPIC INSERTION CONTINUOUS AMBULATORY PERITONEAL DIALYSIS  (CAPD) CATHETER (N/A) ARTERIOVENOUS (AV) FISTULA CREATION ( BRACHIALCEPHALIC ) (Right)  Patient Location: PACU  Anesthesia Type:General  Level of Consciousness: sedated  Airway & Oxygen Therapy: Patient Spontanous Breathing and Patient connected to face mask oxygen  Post-op Assessment: Report given to RN and Post -op Vital signs reviewed and stable  Post vital signs: Reviewed and stable  Last Vitals:  Vitals:   04/07/17 1656 04/07/17 1711  BP: 140/64 (!) 184/88  Pulse: (!) 56 67  Resp: 16 16  Temp: (!) 36.3 C     Last Pain:  Vitals:   04/07/17 1711  TempSrc:   PainSc: 4       Patients Stated Pain Goal: 0 (16/24/46 9507)  Complications: No apparent anesthesia complications

## 2017-04-08 NOTE — Telephone Encounter (Signed)
He should go to the ER

## 2017-04-08 NOTE — Telephone Encounter (Signed)
Patient called stating that he feels the urgency to urinate, but he has not been able to urinate and he would like to know what to do since he had a surgery yesterday??

## 2017-04-08 NOTE — Discharge Instructions (Signed)
Please seek medical attention for any high fevers, chest pain, shortness of breath, change in behavior, persistent vomiting, bloody stool or any other new or concerning symptoms.  

## 2017-04-08 NOTE — ED Triage Notes (Signed)
Pt to triage via w/c with no distress noted; st here earlier for cath placement r/t retention; st having penile pain from catheter with no complications of cath draining

## 2017-04-08 NOTE — Telephone Encounter (Signed)
Patient called to get instructions as to what to do since he is having trouble urinating? Dr. [name] advised that the patient should go locally to a ER/Urgent care to have a bladder scan to determine what is going on, though his issue may be coming from the anesthesia he had during surgery on 04/07/17.

## 2017-04-08 NOTE — Anesthesia Post-op Follow-up Note (Signed)
Anesthesia QCDR form completed.        

## 2017-04-08 NOTE — ED Provider Notes (Signed)
[facility] Emergency Department Provider Note   ____________________________________________   I have reviewed the triage vital signs and the nursing notes.   HISTORY  Chief Complaint Urinary Retention   History limited by: Not Limited   HPI Scott [name] is a 56 y.o. male who presents to the emergency department today because of concern for urinary retention. The patient underwent fistula placement yesterday. Has not urinated since having anesthesia. The patient is experiencing pain. It is located in the lower abdomen. It is severe. Denies any problems with urination in the past.    Past Medical History:  Diagnosis Date  . Arthritis    "left arm; right leg" (12/13/2014)  . Asthma   . CHF (congestive heart failure) ([place])   . Chronic disease anemia    [name] 12/13/2014  . Chronic kidney disease (CKD), stage IV (severe) ([place])    [name] 12/13/2014... on dialysis  . Coronary artery disease    [name] 12/13/2014  . Depression   . Dysrhythmia    patient unaware of irregular heartbeat  . GERD (gastroesophageal reflux disease)   . High cholesterol    [name] 12/13/2014  . Hypertension   . Non-Q wave myocardial infarction ([place])    [name] 12/13/2014  . PVD (peripheral vascular disease) ([place])    [name] 12/13/2014  . Type II diabetes mellitus [facility])     Patient Active Problem List   Diagnosis Date Noted  . Acute on chronic systolic CHF (congestive heart failure) ([place]) 01/17/2017  . End stage renal disease ([place]) 11/25/2016  . Tobacco abuse 11/25/2016  . Acute renal failure (ARF) ([place]) 11/15/2016  . CHF (congestive heart failure) ([place]) 11/13/2016  . Diabetes ([place]) 09/17/2015  . Decreased renal function 05/23/2015  . GERD (gastroesophageal reflux disease) 04/04/2015  . Hypertension 02/26/2015    Past Surgical History:  Procedure Laterality Date  . AV FISTULA PLACEMENT Right 04/07/2017   Procedure: ARTERIOVENOUS (AV) FISTULA CREATION ( BRACHIALCEPHALIC );   Surgeon: Katha Cabal, MD;  Location: [facility] ORS;  Service: Vascular;  Laterality: Right;  . CAPD INSERTION N/A 04/07/2017   Procedure: LAPAROSCOPIC INSERTION CONTINUOUS AMBULATORY PERITONEAL DIALYSIS  (CAPD) CATHETER;  Surgeon: Katha Cabal, MD;  Location: [facility] ORS;  Service: Vascular;  Laterality: N/A;  . CARDIAC CATHETERIZATION  11/2014  . DIALYSIS/PERMA CATHETER INSERTION N/A 01/18/2017   Procedure: Dialysis/Perma Catheter Insertion;  Surgeon: Algernon Huxley, MD;  Location: [place] CV LAB;  Service: Cardiovascular;  Laterality: N/A;  . INCISION AND DRAINAGE OF WOUND Right ~ 10/2014   "4th toe foot"  . PERCUTANEOUS CORONARY STENT INTERVENTION (PCI-S) N/A 12/14/2014   Procedure: PERCUTANEOUS CORONARY STENT INTERVENTION (PCI-S);  Surgeon: Charolette Forward, MD;  Location: [facility] CATH LAB;  Service: Cardiovascular;  Laterality: N/A;  . PERCUTANEOUS CORONARY STENT INTERVENTION (PCI-S) N/A 12/18/2014   Procedure: PERCUTANEOUS CORONARY STENT INTERVENTION (PCI-S);  Surgeon: Charolette Forward, MD;  Location: [facility] CATH LAB;  Service: Cardiovascular;  Laterality: N/A;  . TOE AMPUTATION Right 2015   4th toe    Prior to Admission medications   Medication Sig Start Date End Date Taking? Authorizing Provider  albuterol (PROVENTIL HFA;VENTOLIN HFA) 108 (90 BASE) MCG/ACT inhaler Inhale 2 puffs into the lungs every 6 (six) hours as needed for wheezing or shortness of breath. 01/10/15   Gladstone Lighter, MD  aspirin EC 81 MG tablet Take 1 tablet (81 mg total) by mouth daily. 01/10/15   Gladstone Lighter, MD  atorvastatin (LIPITOR) 80 MG tablet Take 1 tablet (80 mg total)  by mouth daily at 6 PM. 01/10/15   Gladstone Lighter, MD  carvedilol (COREG) 25 MG tablet Take 1 tablet (25 mg total) by mouth 2 (two) times daily with a meal. 01/10/15   Gladstone Lighter, MD  Cholecalciferol (VITAMIN D) 2000 units tablet Take 2,000 Units by mouth daily.    [provider]  exenatide (BYETTA) 5 MCG/0.02ML SOPN injection  Inject 5 mcg into the skin 2 (two) times daily with a meal.    [provider]  insulin aspart protamine- aspart (NOVOLOG MIX 70/30) (70-30) 100 UNIT/ML injection Inject 0.35 mLs (35 Units total) into the skin 2 (two) times daily with a meal. Patient taking differently: Inject 40 Units into the skin 2 (two) times daily with a meal.  01/10/15   Gladstone Lighter, MD  lisinopril (PRINIVIL,ZESTRIL) 20 MG tablet Take 20 mg by mouth daily. 02/02/17   [provider]  metoCLOPramide (REGLAN) 5 MG tablet Take 5 mg by mouth 3 (three) times daily as needed for nausea or vomiting.  01/27/17   [provider]  nitroGLYCERIN (NITROSTAT) 0.4 MG SL tablet Place 1 tablet (0.4 mg total) under the tongue every 5 (five) minutes x 3 doses as needed for chest pain. 12/19/14   Charolette Forward, MD  omeprazole (PRILOSEC) 20 MG capsule Take 1 capsule (20 mg total) by mouth daily. 01/10/15   Gladstone Lighter, MD  pioglitazone (ACTOS) 15 MG tablet Take 15 mg by mouth daily.    [provider]  prasugrel (EFFIENT) 10 MG TABS tablet Take 1 tablet (10 mg total) by mouth daily. 01/10/15   Gladstone Lighter, MD    Allergies Patient has no known allergies.  Family History  Problem Relation Age of Onset  . Cancer Mother        Lung  . Cancer Father        Lung  . Diabetes Sister   . Diabetes Brother   . CAD Brother   . Hernia Son   . Cancer Brother     Social History Social History  Substance Use Topics  . Smoking status: Former Smoker    Packs/day: 0.00    Years: 30.00    Types: Cigarettes    Start date: 08/31/1984    Quit date: 11/30/2014  . Smokeless tobacco: Never Used  . Alcohol use Yes     Comment: Rarely, twice a year.    Review of Systems Constitutional: No fever/chills Eyes: No visual changes. ENT: No sore throat. Cardiovascular: Denies chest pain. Respiratory: Denies shortness of breath. Gastrointestinal: No abdominal pain.  No nausea, no vomiting.  No diarrhea.    Genitourinary: Negative for dysuria. Musculoskeletal: Negative for back pain. Skin: Negative for rash. Neurological: Negative for headaches, focal weakness or numbness.  ____________________________________________   PHYSICAL EXAM:  VITAL SIGNS: ED Triage Vitals  Enc Vitals Group     BP 04/08/17 1143 (!) 182/82     Pulse Rate 04/08/17 1143 76     Resp 04/08/17 1143 18     Temp 04/08/17 1143 97.6 F (36.4 C)     Temp Source 04/08/17 1143 Oral     SpO2 04/08/17 1143 99 %     Weight 04/08/17 1141 243 lb (110.2 kg)     Height 04/08/17 1141 5\' 11"  (1.803 m)     Head Circumference --      Peak Flow --      Pain Score 04/08/17 1140 10     Pain Loc --  Pain Edu? --      Excl. in [place]? --      Constitutional: Alert and oriented. Well appearing and in no distress. Eyes: Conjunctivae are normal.  ENT   Head: Normocephalic and atraumatic.   Nose: No congestion/rhinnorhea.   Mouth/Throat: Mucous membranes are moist.   Neck: No stridor. Hematological/Lymphatic/Immunilogical: No cervical lymphadenopathy. Cardiovascular: Normal rate, regular rhythm.  No murmurs, rubs, or gallops.  Respiratory: Normal respiratory effort without tachypnea nor retractions. Breath sounds are clear and equal bilaterally. No wheezes/rales/rhonchi. Gastrointestinal: Soft. Slightly tender to palpation. Surgical incision sites c/d/i. Genitourinary: Deferred Musculoskeletal: Normal range of motion in all extremities. No lower extremity edema. Neurologic:  Normal speech and language. No gross focal neurologic deficits are appreciated.  Skin:  Skin is warm, dry and intact. No rash noted. Psychiatric: Mood and affect are normal. Speech and behavior are normal. Patient exhibits appropriate insight and judgment.  ____________________________________________    LABS (pertinent positives/negatives)  Urine culture  pending  ____________________________________________   EKG  None  ____________________________________________    RADIOLOGY  None   ____________________________________________   PROCEDURES  Procedures  ____________________________________________   INITIAL IMPRESSION / ASSESSMENT AND PLAN / ED COURSE  Pertinent labs & imaging results that were available during my care of the patient were reviewed by me and considered in my medical decision making (see chart for details).  Patient presented to the emergency department today because concerns for urinary retention. Patient did have anesthesia yesterday. Will plan on placing urinary catheter.  Patient feels good improvement after urinary catheter placed. Large amount of output. Will plan on discharge to follow up with urology.  ____________________________________________   FINAL CLINICAL IMPRESSION(S) / ED DIAGNOSES  Final diagnoses:  Urinary retention     Note: This dictation was prepared with Dragon dictation. Any transcriptional errors that result from this process are unintentional     Nance Pear, MD 04/08/17 1329

## 2017-04-08 NOTE — ED Triage Notes (Signed)
Had surgery yesterday and has not voided since prior to surgery.  Feels like he is going to bust.

## 2017-04-08 NOTE — Anesthesia Postprocedure Evaluation (Signed)
Anesthesia Post Note  Patient: Scott [name]  Procedure(s) Performed: Procedure(s) (LRB): LAPAROSCOPIC INSERTION CONTINUOUS AMBULATORY PERITONEAL DIALYSIS  (CAPD) CATHETER (N/A) ARTERIOVENOUS (AV) FISTULA CREATION ( BRACHIALCEPHALIC ) (Right)  Patient location during evaluation: PACU Anesthesia Type: General Level of consciousness: awake and alert Pain management: pain level controlled Vital Signs Assessment: post-procedure vital signs reviewed and stable Respiratory status: spontaneous breathing, nonlabored ventilation, respiratory function stable and patient connected to nasal cannula oxygen Cardiovascular status: blood pressure returned to baseline and stable Postop Assessment: no signs of nausea or vomiting Anesthetic complications: no     Last Vitals:  Vitals:   04/07/17 1656 04/07/17 1711  BP: 140/64 (!) 184/88  Pulse: (!) 56 67  Resp: 16 16  Temp: (!) 36.3 C     Last Pain:  Vitals:   04/07/17 1711  TempSrc:   PainSc: 4                  Broadus John K Kalmen Lollar

## 2017-04-09 MED ORDER — LIDOCAINE HCL 2 % EX GEL
CUTANEOUS | Status: DC
Start: 2017-04-09 — End: 2017-04-09
  Filled 2017-04-09: qty 10

## 2017-04-09 NOTE — ED Notes (Signed)
Patient was having a lot of pain prior to removing the 16 fr foley. After removal patient had an instant decrease in discomfort but not complete relief. Patient tolerated foley change well. Patient does not voice any new or worsen discomfort with new small 14 fr foley in place.

## 2017-04-09 NOTE — ED Provider Notes (Signed)
[facility] Emergency Department Provider Note    First MD Initiated Contact with Patient 04/08/17 2319     (approximate)  I have reviewed the triage vital signs and the nursing notes.   HISTORY  Chief Complaint Penis Pain    HPI Scott [name] is a 56 y.o. male with below list of chronic medical conditions recently seen in the emergency department yesterday evening for urinary retention for which a catheter was placed return to the emergency department with penile discomfort that is currently 7 out of 10. Patient had a AV fistula placement performed yesterday and noted that he was unable to urinate which prompted his initial emergency Department visit. Patient states since catheter placement he has had increasing discomfort in his penis. Patient does state that the catheter has been draining urine since placement.   Past Medical History:  Diagnosis Date  . Arthritis    "left arm; right leg" (12/13/2014)  . Asthma   . CHF (congestive heart failure) ([place])   . Chronic disease anemia    [name] 12/13/2014  . Chronic kidney disease (CKD), stage IV (severe) ([place])    [name] 12/13/2014... on dialysis  . Coronary artery disease    [name] 12/13/2014  . Depression   . Dysrhythmia    patient unaware of irregular heartbeat  . GERD (gastroesophageal reflux disease)   . High cholesterol    [name] 12/13/2014  . Hypertension   . Non-Q wave myocardial infarction ([place])    [name] 12/13/2014  . PVD (peripheral vascular disease) ([place])    [name] 12/13/2014  . Type II diabetes mellitus [facility])     Patient Active Problem List   Diagnosis Date Noted  . Acute on chronic systolic CHF (congestive heart failure) ([place]) 01/17/2017  . End stage renal disease ([place]) 11/25/2016  . Tobacco abuse 11/25/2016  . Acute renal failure (ARF) ([place]) 11/15/2016  . CHF (congestive heart failure) ([place]) 11/13/2016  . Diabetes ([place]) 09/17/2015  . Decreased renal function 05/23/2015  . GERD  (gastroesophageal reflux disease) 04/04/2015  . Hypertension 02/26/2015    Past Surgical History:  Procedure Laterality Date  . AV FISTULA PLACEMENT Right 04/07/2017   Procedure: ARTERIOVENOUS (AV) FISTULA CREATION ( BRACHIALCEPHALIC );  Surgeon: Katha Cabal, MD;  Location: [facility] ORS;  Service: Vascular;  Laterality: Right;  . CAPD INSERTION N/A 04/07/2017   Procedure: LAPAROSCOPIC INSERTION CONTINUOUS AMBULATORY PERITONEAL DIALYSIS  (CAPD) CATHETER;  Surgeon: Katha Cabal, MD;  Location: [facility] ORS;  Service: Vascular;  Laterality: N/A;  . CARDIAC CATHETERIZATION  11/2014  . DIALYSIS/PERMA CATHETER INSERTION N/A 01/18/2017   Procedure: Dialysis/Perma Catheter Insertion;  Surgeon: Algernon Huxley, MD;  Location: [place] CV LAB;  Service: Cardiovascular;  Laterality: N/A;  . INCISION AND DRAINAGE OF WOUND Right ~ 10/2014   "4th toe foot"  . PERCUTANEOUS CORONARY STENT INTERVENTION (PCI-S) N/A 12/14/2014   Procedure: PERCUTANEOUS CORONARY STENT INTERVENTION (PCI-S);  Surgeon: Charolette Forward, MD;  Location: [facility] CATH LAB;  Service: Cardiovascular;  Laterality: N/A;  . PERCUTANEOUS CORONARY STENT INTERVENTION (PCI-S) N/A 12/18/2014   Procedure: PERCUTANEOUS CORONARY STENT INTERVENTION (PCI-S);  Surgeon: Charolette Forward, MD;  Location: [facility] CATH LAB;  Service: Cardiovascular;  Laterality: N/A;  . TOE AMPUTATION Right 2015   4th toe    Prior to Admission medications   Medication Sig Start Date End Date Taking? Authorizing Provider  albuterol (PROVENTIL HFA;VENTOLIN HFA) 108 (90 BASE) MCG/ACT inhaler Inhale 2 puffs into the lungs every 6 (six) hours as needed  for wheezing or shortness of breath. 01/10/15   Gladstone Lighter, MD  aspirin EC 81 MG tablet Take 1 tablet (81 mg total) by mouth daily. 01/10/15   Gladstone Lighter, MD  atorvastatin (LIPITOR) 80 MG tablet Take 1 tablet (80 mg total) by mouth daily at 6 PM. 01/10/15   Gladstone Lighter, MD  carvedilol (COREG) 25 MG tablet Take 1 tablet (25  mg total) by mouth 2 (two) times daily with a meal. 01/10/15   Gladstone Lighter, MD  Cholecalciferol (VITAMIN D) 2000 units tablet Take 2,000 Units by mouth daily.    [provider]  exenatide (BYETTA) 5 MCG/0.02ML SOPN injection Inject 5 mcg into the skin 2 (two) times daily with a meal.    [provider]  insulin aspart protamine- aspart (NOVOLOG MIX 70/30) (70-30) 100 UNIT/ML injection Inject 0.35 mLs (35 Units total) into the skin 2 (two) times daily with a meal. Patient taking differently: Inject 40 Units into the skin 2 (two) times daily with a meal.  01/10/15   Gladstone Lighter, MD  lisinopril (PRINIVIL,ZESTRIL) 20 MG tablet Take 20 mg by mouth daily. 02/02/17   [provider]  metoCLOPramide (REGLAN) 5 MG tablet Take 5 mg by mouth 3 (three) times daily as needed for nausea or vomiting.  01/27/17   [provider]  nitroGLYCERIN (NITROSTAT) 0.4 MG SL tablet Place 1 tablet (0.4 mg total) under the tongue every 5 (five) minutes x 3 doses as needed for chest pain. 12/19/14   Charolette Forward, MD  omeprazole (PRILOSEC) 20 MG capsule Take 1 capsule (20 mg total) by mouth daily. 01/10/15   Gladstone Lighter, MD  pioglitazone (ACTOS) 15 MG tablet Take 15 mg by mouth daily.    [provider]  prasugrel (EFFIENT) 10 MG TABS tablet Take 1 tablet (10 mg total) by mouth daily. 01/10/15   Gladstone Lighter, MD    Allergies No known drug allergies  Family History  Problem Relation Age of Onset  . Cancer Mother        Lung  . Cancer Father        Lung  . Diabetes Sister   . Diabetes Brother   . CAD Brother   . Hernia Son   . Cancer Brother     Social History Social History  Substance Use Topics  . Smoking status: Former Smoker    Packs/day: 0.00    Years: 30.00    Types: Cigarettes    Start date: 08/31/1984    Quit date: 11/30/2014  . Smokeless tobacco: Never Used  . Alcohol use Yes     Comment: Rarely, twice a year.    Review of  Systems Constitutional: No fever/chills Eyes: No visual changes. ENT: No sore throat. Cardiovascular: Denies chest pain. Respiratory: Denies shortness of breath. Gastrointestinal: No abdominal pain.  No nausea, no vomiting.  No diarrhea.  No constipation. Genitourinary: Negative for dysuria.Penile discomfort Musculoskeletal: Negative for neck pain.  Negative for back pain. Integumentary: Negative for rash. Neurological: Negative for headaches, focal weakness or numbness.   ____________________________________________   PHYSICAL EXAM:  VITAL SIGNS: ED Triage Vitals  Enc Vitals Group     BP 04/08/17 2156 (!) 169/78     Pulse Rate 04/08/17 2156 74     Resp 04/08/17 2156 18     Temp 04/08/17 2156 98.7 F (37.1 C)     Temp Source 04/08/17 2156 Oral     SpO2 04/08/17 2156 98 %     Weight 04/08/17 2157  110.2 kg (243 lb)     Height 04/08/17 2157 1.803 m (5\' 11" )     Head Circumference --      Peak Flow --      Pain Score 04/08/17 2156 10     Pain Loc --      Pain Edu? --      Excl. in [place]? --     Constitutional: Alert and oriented. Well appearing and in no acute distress. Eyes: Conjunctivae are normal.  Head: Atraumatic. Cardiovascular: Normal rate, regular rhythm. Good peripheral circulation. Grossly normal heart sounds. Respiratory: Normal respiratory effort.  No retractions. Lungs CTAB. Gastrointestinal: Soft and nontender. No distention.  Genitourinary: Foley catheter in place with clear urine noted in the Foley catheter bag. No apparent penile trauma noted Musculoskeletal: No lower extremity tenderness nor edema. No gross deformities of extremities. Neurologic:  Normal speech and language. No gross focal neurologic deficits are appreciated.  Skin:  Skin is warm, dry and intact. No rash noted. Psychiatric: Mood and affect are normal. Speech and behavior are normal.    Procedures   ____________________________________________   INITIAL IMPRESSION / ASSESSMENT AND  PLAN / ED COURSE  Pertinent labs & imaging results that were available during my care of the patient were reviewed by me and considered in my medical decision making (see chart for details).  Foley catheter was removed Urojet lidocaine inserted into the penis. And a smaller Foley catheter was inserted. Patient denies any pain at present stating that it is completely resolved.      ____________________________________________  FINAL CLINICAL IMPRESSION(S) / ED DIAGNOSES  Final diagnoses:  Foley catheter problem, initial encounter ([place])     MEDICATIONS GIVEN DURING THIS VISIT:  Medications  lidocaine (XYLOCAINE) 2 % jelly 1 application (not administered)  lidocaine (XYLOCAINE) 2 % jelly (not administered)     NEW OUTPATIENT MEDICATIONS STARTED DURING THIS VISIT:  New Prescriptions   No medications on file    Modified Medications   No medications on file    Discontinued Medications   No medications on file     Note:  This document was prepared using Dragon voice recognition software and may include unintentional dictation errors.    Gregor Hams, MD 04/09/17 [phone]

## 2017-04-09 NOTE — ED Notes (Signed)
Patient verbalizes understanding of d/c instructions and follow-up. VS stable and pain controlled per patient.  Patient in NAD at time of d/c and denies further concerns regarding this visit. Patient stable at the time of departure from the unit, departing unit by the safest and most appropriate manner per that patients condition and limitations. Patient advised to return to the ED at any time for emergent concerns, or for new/worsening symptoms.   Patient has urology appointment Monday!

## 2017-04-10 LAB — URINE CULTURE: CULTURE: NO GROWTH

## 2017-04-12 ENCOUNTER — Encounter: Payer: Self-pay | Admitting: Vascular Surgery

## 2017-04-17 ENCOUNTER — Emergency Department
Admission: EM | Admit: 2017-04-17 | Discharge: 2017-04-17 | Disposition: A | Discharge status: Home / Self Care | Payer: Medicare Other | Attending: Emergency Medicine | Admitting: Emergency Medicine

## 2017-04-17 ENCOUNTER — Encounter: Payer: Self-pay | Admitting: Emergency Medicine

## 2017-04-17 DIAGNOSIS — J45909 Unspecified asthma, uncomplicated: Secondary | ICD-10-CM | POA: Diagnosis not present

## 2017-04-17 DIAGNOSIS — N4889 Other specified disorders of penis: Secondary | ICD-10-CM | POA: Insufficient documentation

## 2017-04-17 DIAGNOSIS — Z466 Encounter for fitting and adjustment of urinary device: Secondary | ICD-10-CM

## 2017-04-17 DIAGNOSIS — N184 Chronic kidney disease, stage 4 (severe): Secondary | ICD-10-CM | POA: Insufficient documentation

## 2017-04-17 DIAGNOSIS — Z87891 Personal history of nicotine dependence: Secondary | ICD-10-CM | POA: Diagnosis not present

## 2017-04-17 DIAGNOSIS — I2510 Atherosclerotic heart disease of native coronary artery without angina pectoris: Secondary | ICD-10-CM | POA: Insufficient documentation

## 2017-04-17 DIAGNOSIS — Z79899 Other long term (current) drug therapy: Secondary | ICD-10-CM | POA: Diagnosis not present

## 2017-04-17 DIAGNOSIS — I1310 Hypertensive heart and chronic kidney disease without heart failure, with stage 1 through stage 4 chronic kidney disease, or unspecified chronic kidney disease: Secondary | ICD-10-CM | POA: Insufficient documentation

## 2017-04-17 DIAGNOSIS — E1122 Type 2 diabetes mellitus with diabetic chronic kidney disease: Secondary | ICD-10-CM | POA: Insufficient documentation

## 2017-04-17 DIAGNOSIS — Z794 Long term (current) use of insulin: Secondary | ICD-10-CM | POA: Insufficient documentation

## 2017-04-17 DIAGNOSIS — Z7982 Long term (current) use of aspirin: Secondary | ICD-10-CM | POA: Insufficient documentation

## 2017-04-17 DIAGNOSIS — I5023 Acute on chronic systolic (congestive) heart failure: Secondary | ICD-10-CM | POA: Insufficient documentation

## 2017-04-17 NOTE — ED Triage Notes (Signed)
Pt had foley catheter placed last Thursday for urinary retention.  C/o pain to urethra.  Initially came back after placed because so much pain.  Wants catheter out today doesn't care about retention.  Pt does not want to be worked up for anything.  Seeing urology Monday but does not want to wait until then to get it out.  Pt states "if you don't take it out I will".  Only here for removal of foley catheter.

## 2017-04-17 NOTE — ED Provider Notes (Signed)
[facility] Emergency Department Provider Note  ____________________________________________  Time seen: Approximately 5:59 PM  I have reviewed the triage vital signs and the nursing notes.   HISTORY  Chief Complaint wants foley removed    HPI Scott [name] is a 56 y.o. male Who presents emergency department requesting that we take out his indwelling urinary catheter. Patient reports that he had surgery on his abdomen 9 days prior. He had acute urinary retention that evening had to present to the emergency department for Foley catheter. Patient returned the subsequent day for penile pain from his Foley catheter. After lidocaine gel and replacement with smaller catheter tubing, patient was able to be discharged. Patient returns today requesting that we take his urinary catheter out. Patient has a follow-up with urology in 2 days but states that he cannot wait due to the discomfort he is experiencing from his Foley catheter.  Patient denies any fevers or chills, abdominal pain, hematuria. At this time, we had a lengthy discussion of risks of removing the Foley catheter without being evaluated by urology. I advised patient that this may cause urinary retention, acute kidney injury, UTI from repeated catheterization, ultimately urosepsis. Patient verbalizes understanding of risks of removal of catheter without urology appointment. Patient verbalizes he will keep his urology appointment in 2 days. Patient reports that if we do not remove his catheter he will be forced to take it out himself. Patient verbalizes understanding that he may return to the emergency department if he experiences dysuria, polyuria, hematuria, urinary retention, flank pain, any other complaint.   Past Medical History:  Diagnosis Date  . Arthritis    "left arm; right leg" (12/13/2014)  . Asthma   . CHF (congestive heart failure) ([place])   . Chronic disease anemia    [name] 12/13/2014  . Chronic  kidney disease (CKD), stage IV (severe) ([place])    [name] 12/13/2014... on dialysis  . Coronary artery disease    [name] 12/13/2014  . Depression   . Dysrhythmia    patient unaware of irregular heartbeat  . GERD (gastroesophageal reflux disease)   . High cholesterol    [name] 12/13/2014  . Hypertension   . Non-Q wave myocardial infarction ([place])    [name] 12/13/2014  . PVD (peripheral vascular disease) ([place])    [name] 12/13/2014  . Type II diabetes mellitus [facility])     Patient Active Problem List   Diagnosis Date Noted  . Acute on chronic systolic CHF (congestive heart failure) ([place]) 01/17/2017  . End stage renal disease ([place]) 11/25/2016  . Tobacco abuse 11/25/2016  . Acute renal failure (ARF) ([place]) 11/15/2016  . CHF (congestive heart failure) ([place]) 11/13/2016  . Diabetes ([place]) 09/17/2015  . Decreased renal function 05/23/2015  . GERD (gastroesophageal reflux disease) 04/04/2015  . Hypertension 02/26/2015    Past Surgical History:  Procedure Laterality Date  . AV FISTULA PLACEMENT Right 04/07/2017   Procedure: ARTERIOVENOUS (AV) FISTULA CREATION ( BRACHIALCEPHALIC );  Surgeon: Katha Cabal, MD;  Location: [facility] ORS;  Service: Vascular;  Laterality: Right;  . CAPD INSERTION N/A 04/07/2017   Procedure: LAPAROSCOPIC INSERTION CONTINUOUS AMBULATORY PERITONEAL DIALYSIS  (CAPD) CATHETER;  Surgeon: Katha Cabal, MD;  Location: [facility] ORS;  Service: Vascular;  Laterality: N/A;  . CARDIAC CATHETERIZATION  11/2014  . DIALYSIS/PERMA CATHETER INSERTION N/A 01/18/2017   Procedure: Dialysis/Perma Catheter Insertion;  Surgeon: Algernon Huxley, MD;  Location: [place] CV LAB;  Service: Cardiovascular;  Laterality: N/A;  . INCISION AND DRAINAGE OF  WOUND Right ~ 10/2014   "4th toe foot"  . PERCUTANEOUS CORONARY STENT INTERVENTION (PCI-S) N/A 12/14/2014   Procedure: PERCUTANEOUS CORONARY STENT INTERVENTION (PCI-S);  Surgeon: Charolette Forward, MD;  Location: [facility] CATH LAB;  Service: Cardiovascular;   Laterality: N/A;  . PERCUTANEOUS CORONARY STENT INTERVENTION (PCI-S) N/A 12/18/2014   Procedure: PERCUTANEOUS CORONARY STENT INTERVENTION (PCI-S);  Surgeon: Charolette Forward, MD;  Location: [facility] CATH LAB;  Service: Cardiovascular;  Laterality: N/A;  . TOE AMPUTATION Right 2015   4th toe    Prior to Admission medications   Medication Sig Start Date End Date Taking? Authorizing Provider  albuterol (PROVENTIL HFA;VENTOLIN HFA) 108 (90 BASE) MCG/ACT inhaler Inhale 2 puffs into the lungs every 6 (six) hours as needed for wheezing or shortness of breath. 01/10/15   Gladstone Lighter, MD  aspirin EC 81 MG tablet Take 1 tablet (81 mg total) by mouth daily. 01/10/15   Gladstone Lighter, MD  atorvastatin (LIPITOR) 80 MG tablet Take 1 tablet (80 mg total) by mouth daily at 6 PM. 01/10/15   Gladstone Lighter, MD  carvedilol (COREG) 25 MG tablet Take 1 tablet (25 mg total) by mouth 2 (two) times daily with a meal. 01/10/15   Gladstone Lighter, MD  Cholecalciferol (VITAMIN D) 2000 units tablet Take 2,000 Units by mouth daily.    [provider]  exenatide (BYETTA) 5 MCG/0.02ML SOPN injection Inject 5 mcg into the skin 2 (two) times daily with a meal.    [provider]  insulin aspart protamine- aspart (NOVOLOG MIX 70/30) (70-30) 100 UNIT/ML injection Inject 0.35 mLs (35 Units total) into the skin 2 (two) times daily with a meal. Patient taking differently: Inject 40 Units into the skin 2 (two) times daily with a meal.  01/10/15   Gladstone Lighter, MD  lisinopril (PRINIVIL,ZESTRIL) 20 MG tablet Take 20 mg by mouth daily. 02/02/17   [provider]  metoCLOPramide (REGLAN) 5 MG tablet Take 5 mg by mouth 3 (three) times daily as needed for nausea or vomiting.  01/27/17   [provider]  nitroGLYCERIN (NITROSTAT) 0.4 MG SL tablet Place 1 tablet (0.4 mg total) under the tongue every 5 (five) minutes x 3 doses as needed for chest pain. 12/19/14   Charolette Forward, MD  omeprazole (PRILOSEC)  20 MG capsule Take 1 capsule (20 mg total) by mouth daily. 01/10/15   Gladstone Lighter, MD  pioglitazone (ACTOS) 15 MG tablet Take 15 mg by mouth daily.    [provider]  prasugrel (EFFIENT) 10 MG TABS tablet Take 1 tablet (10 mg total) by mouth daily. 01/10/15   Gladstone Lighter, MD    Allergies Patient has no known allergies.  Family History  Problem Relation Age of Onset  . Cancer Mother        Lung  . Cancer Father        Lung  . Diabetes Sister   . Diabetes Brother   . CAD Brother   . Hernia Son   . Cancer Brother     Social History Social History  Substance Use Topics  . Smoking status: Former Smoker    Packs/day: 0.00    Years: 30.00    Types: Cigarettes    Start date: 08/31/1984    Quit date: 11/30/2014  . Smokeless tobacco: Never Used  . Alcohol use Yes     Comment: Rarely, twice a year.     Review of Systems  Constitutional: No fever/chills Eyes: No visual changes.  Cardiovascular: no chest pain. Respiratory:  no cough. No SOB. Gastrointestinal: No abdominal pain.  No nausea, no vomiting.  No diarrhea.  No constipation. Genitourinary: Negative for dysuria. No hematuria. Positive for ongoing pain on pain from indwelling urinary catheter. Musculoskeletal: Negative for musculoskeletal pain. Skin: Negative for rash, abrasions, lacerations, ecchymosis. Neurological: Negative for headaches, focal weakness or numbness. 10-point ROS otherwise negative.  ____________________________________________   PHYSICAL EXAM:  VITAL SIGNS: ED Triage Vitals  Enc Vitals Group     BP 04/17/17 1626 (!) 164/83     Pulse Rate 04/17/17 1626 72     Resp 04/17/17 1626 20     Temp 04/17/17 1626 98.4 F (36.9 C)     Temp Source 04/17/17 1626 Oral     SpO2 04/17/17 1626 97 %     Weight 04/17/17 1628 243 lb (110.2 kg)     Height 04/17/17 1628 5\' 11"  (1.803 m)     Head Circumference --      Peak Flow --      Pain Score 04/17/17 1627 10     Pain Loc --      Pain  Edu? --      Excl. in [place]? --      Constitutional: Alert and oriented. Well appearing and in no acute distress. Eyes: Conjunctivae are normal. PERRL. EOMI. Head: Atraumatic. ENT:      Ears:       Nose: No congestion/rhinnorhea.      Mouth/Throat: Mucous membranes are moist.  Neck: No stridor.    Cardiovascular: Normal rate, regular rhythm. Normal S1 and S2.  Good peripheral circulation. Respiratory: Normal respiratory effort without tachypnea or retractions. Lungs CTAB. Good air entry to the bases with no decreased or absent breath sounds. Gastrointestinal: surgical incision sites on her abdomen. Be healing well with no signs of infection. No drainage from around surgical sites. Patient has indwelling drains with no signs of infection. Bowel sounds 4 quadrants. Soft and nontender to palpation. No guarding or rigidity. No palpable masses. No distention. No CVA tenderness. Musculoskeletal: Full range of motion to all extremities. No gross deformities appreciated. Neurologic:  Normal speech and language. No gross focal neurologic deficits are appreciated.  Skin:  Skin is warm, dry and intact. No rash noted. Psychiatric: Mood and affect are normal. Speech and behavior are normal. Patient exhibits appropriate insight and judgement.   ____________________________________________   LABS (all labs ordered are listed, but only abnormal results are displayed)  Labs Reviewed - No data to display ____________________________________________  EKG   ____________________________________________  RADIOLOGY   No results found.  ____________________________________________    PROCEDURES  Procedure(s) performed:    Procedures    Medications - No data to display   ____________________________________________   INITIAL IMPRESSION / ASSESSMENT AND PLAN / ED COURSE  Pertinent labs & imaging results that were available during my care of the patient were reviewed by me and  considered in my medical decision making (see chart for details).  Review of the [place] CSRS was performed in accordance of the [place] prior to dispensing any controlled drugs.     Patient's diagnosis is consistent with pain from indwelling urinary catheter. Patient presents emergency Department requesting his Foley catheter be removed. At this time, I have explained the risks of removal prior to patient seeing urology. Patient verbalizes understanding of all the risks and states that "if you don't remove it, I'm pulling it out myself." Patient denies any symptoms concerning for UTI. patient declines any evaluation with labs or imaging at this  time.patient understands he may return to the emergency department for any complaint including urinary complaints at discharge. Patient was offered lidocaine gel 3 insertion of catheter declines. Patient reports that he had constipation and offers abdominal surgery and has had 2 large bowel movements in the past 2 days. Patient states that he believes he can urinate on his own. Again, patient is warned multiple times of risk of removing Foley catheter without further evaluation by urology. Patient understands risks and continues to verbalize his desire for removal of Foley catheter. Patient verbalizes he will follow up with urology in 2 days.Foley catheter is removed at this time. Patient is given ED precautions to return to the ED for any worsening or new symptoms.     ____________________________________________  FINAL CLINICAL IMPRESSION(S) / ED DIAGNOSES  Final diagnoses:  Penile pain  Encounter for Foley catheter removal      NEW MEDICATIONS STARTED DURING THIS VISIT:  New Prescriptions   No medications on file        This chart was dictated using voice recognition software/Dragon. Despite best efforts to proofread, errors can occur which can change the meaning. Any change was purely unintentional.    Darletta Moll, PA-C 04/17/17  1806    Cuthriell, Charline Bills, PA-C 04/17/17 Dionicia Abler    Lisa Roca, MD 04/18/17 1524

## 2017-04-19 ENCOUNTER — Ambulatory Visit: Payer: Self-pay | Admitting: Urology

## 2017-04-26 ENCOUNTER — Ambulatory Visit: Payer: Self-pay

## 2017-05-06 ENCOUNTER — Ambulatory Visit: Payer: Medicare Other | Attending: Family | Admitting: Family

## 2017-05-06 VITALS — BP 155/67 | HR 77 | Resp 20 | Ht 70.0 in | Wt 240.0 lb

## 2017-05-06 DIAGNOSIS — I5032 Chronic diastolic (congestive) heart failure: Secondary | ICD-10-CM | POA: Diagnosis present

## 2017-05-06 DIAGNOSIS — Z7982 Long term (current) use of aspirin: Secondary | ICD-10-CM | POA: Insufficient documentation

## 2017-05-06 DIAGNOSIS — K219 Gastro-esophageal reflux disease without esophagitis: Secondary | ICD-10-CM | POA: Diagnosis not present

## 2017-05-06 DIAGNOSIS — N186 End stage renal disease: Secondary | ICD-10-CM | POA: Insufficient documentation

## 2017-05-06 DIAGNOSIS — Z8249 Family history of ischemic heart disease and other diseases of the circulatory system: Secondary | ICD-10-CM | POA: Insufficient documentation

## 2017-05-06 DIAGNOSIS — Z79899 Other long term (current) drug therapy: Secondary | ICD-10-CM | POA: Diagnosis not present

## 2017-05-06 DIAGNOSIS — E1122 Type 2 diabetes mellitus with diabetic chronic kidney disease: Secondary | ICD-10-CM | POA: Diagnosis not present

## 2017-05-06 DIAGNOSIS — E785 Hyperlipidemia, unspecified: Secondary | ICD-10-CM | POA: Diagnosis not present

## 2017-05-06 DIAGNOSIS — I132 Hypertensive heart and chronic kidney disease with heart failure and with stage 5 chronic kidney disease, or end stage renal disease: Secondary | ICD-10-CM | POA: Insufficient documentation

## 2017-05-06 DIAGNOSIS — E1151 Type 2 diabetes mellitus with diabetic peripheral angiopathy without gangrene: Secondary | ICD-10-CM | POA: Insufficient documentation

## 2017-05-06 DIAGNOSIS — E7800 Pure hypercholesterolemia, unspecified: Secondary | ICD-10-CM | POA: Insufficient documentation

## 2017-05-06 DIAGNOSIS — I252 Old myocardial infarction: Secondary | ICD-10-CM | POA: Insufficient documentation

## 2017-05-06 DIAGNOSIS — Z992 Dependence on renal dialysis: Secondary | ICD-10-CM | POA: Insufficient documentation

## 2017-05-06 DIAGNOSIS — I2510 Atherosclerotic heart disease of native coronary artery without angina pectoris: Secondary | ICD-10-CM | POA: Diagnosis not present

## 2017-05-06 DIAGNOSIS — J45909 Unspecified asthma, uncomplicated: Secondary | ICD-10-CM | POA: Insufficient documentation

## 2017-05-06 DIAGNOSIS — F329 Major depressive disorder, single episode, unspecified: Secondary | ICD-10-CM | POA: Insufficient documentation

## 2017-05-06 DIAGNOSIS — Z955 Presence of coronary angioplasty implant and graft: Secondary | ICD-10-CM | POA: Insufficient documentation

## 2017-05-06 DIAGNOSIS — Z794 Long term (current) use of insulin: Secondary | ICD-10-CM

## 2017-05-06 DIAGNOSIS — I10 Essential (primary) hypertension: Secondary | ICD-10-CM

## 2017-05-06 NOTE — Patient Instructions (Signed)
Continue weighing daily and call for an overnight weight gain of > 2 pounds or a weekly weight gain of >5 pounds. 

## 2017-05-06 NOTE — Progress Notes (Signed)
Patient ID: Scott [name], male    DOB: 21-Jan-1961, 56 y.o.   MRN: [id number]  HPI  Scott [name] is a 62 yoM with PMH of HTN, ESRD on dialysis M/W/F, T2DM, HLD, CAD, NSTEMI s/p stents 4/17, congestive heart failure with preserved ejection fraction.  Echo on 11/13/16 showed EF of 55-60% similar to echo on 01/09/15 showing EF of 55-65%  Was in the ED 3 times from 04/08/17-04/17/17 due to urinary/catheter issues. Each time he was treated and released. Was in the ED 02/10/17 due to hypotension. Treated and released.  Last admitted on 01/17/17 for respiratory distress due to CHF exacerbation. Cardiology, vascular, and nephrology was consulted. He was initiated on dialysis and received Bipap. Upon discharge HCTZ, Byetta, and pioglitazone were d/c. Admitted on 11/13/16 for SOB x 3 weeks and leg swelling. He was also found to be in AKI. Initially treated with IV furosemide which was d/c along with lisinopril due to AKI. Pt also had proteinuria. Upon discharge lisinopril, furosemide and spirolactone was d/c  Presents today for a follow-up visit with a chief complaint of mild shortness of breath upon moderate exertion. He describes this as chronic in nature having been present for several years with varying levels of severity. He has associated fatigue and chronic difficulty sleeping along with this. He denies any chest pain, edema, palpitations, dizziness or weight gain. Will be starting home dialysis in the near future.   Past Medical History:  Diagnosis Date  . Arthritis    "left arm; right leg" (12/13/2014)  . Asthma   . CHF (congestive heart failure) ([place])   . Chronic disease anemia    [name] 12/13/2014  . Chronic kidney disease (CKD), stage IV (severe) ([place])    [name] 12/13/2014... on dialysis  . Coronary artery disease    [name] 12/13/2014  . Depression   . Dysrhythmia    patient unaware of irregular heartbeat  . GERD (gastroesophageal reflux disease)   . High cholesterol    [name] 12/13/2014  .  Hypertension   . Non-Q wave myocardial infarction ([place])    [name] 12/13/2014  . PVD (peripheral vascular disease) ([place])    [name] 12/13/2014  . Type II diabetes mellitus ([place])    Past Surgical History:  Procedure Laterality Date  . AV FISTULA PLACEMENT Right 04/07/2017   Procedure: ARTERIOVENOUS (AV) FISTULA CREATION ( BRACHIALCEPHALIC );  Surgeon: Katha Cabal, MD;  Location: [facility] ORS;  Service: Vascular;  Laterality: Right;  . CAPD INSERTION N/A 04/07/2017   Procedure: LAPAROSCOPIC INSERTION CONTINUOUS AMBULATORY PERITONEAL DIALYSIS  (CAPD) CATHETER;  Surgeon: Katha Cabal, MD;  Location: [facility] ORS;  Service: Vascular;  Laterality: N/A;  . CARDIAC CATHETERIZATION  11/2014  . DIALYSIS/PERMA CATHETER INSERTION N/A 01/18/2017   Procedure: Dialysis/Perma Catheter Insertion;  Surgeon: Algernon Huxley, MD;  Location: [place] CV LAB;  Service: Cardiovascular;  Laterality: N/A;  . INCISION AND DRAINAGE OF WOUND Right ~ 10/2014   "4th toe foot"  . PERCUTANEOUS CORONARY STENT INTERVENTION (PCI-S) N/A 12/14/2014   Procedure: PERCUTANEOUS CORONARY STENT INTERVENTION (PCI-S);  Surgeon: Charolette Forward, MD;  Location: [facility] CATH LAB;  Service: Cardiovascular;  Laterality: N/A;  . PERCUTANEOUS CORONARY STENT INTERVENTION (PCI-S) N/A 12/18/2014   Procedure: PERCUTANEOUS CORONARY STENT INTERVENTION (PCI-S);  Surgeon: Charolette Forward, MD;  Location: [facility] CATH LAB;  Service: Cardiovascular;  Laterality: N/A;  . TOE AMPUTATION Right 2015   4th toe   Family History  Problem Relation Age of Onset  . Cancer Mother  Lung  . Cancer Father        Lung  . Diabetes Sister   . Diabetes Brother   . CAD Brother   . Hernia Son   . Cancer Brother    Social History  Substance Use Topics  . Smoking status: Former Smoker    Packs/day: 0.00    Years: 30.00    Types: Cigarettes    Start date: 08/31/1984    Quit date: 11/30/2014  . Smokeless tobacco: Never Used  . Alcohol use Yes     Comment: Rarely, twice a  year.   No Known Allergies Prior to Admission medications   Medication Sig Start Date End Date Taking? Authorizing Provider  albuterol (PROVENTIL HFA;VENTOLIN HFA) 108 (90 BASE) MCG/ACT inhaler Inhale 2 puffs into the lungs every 6 (six) hours as needed for wheezing or shortness of breath. 01/10/15  Yes Gladstone Lighter, MD  aspirin EC 81 MG tablet Take 1 tablet (81 mg total) by mouth daily. 01/10/15  Yes Gladstone Lighter, MD  atorvastatin (LIPITOR) 80 MG tablet Take 1 tablet (80 mg total) by mouth daily at 6 PM. 01/10/15  Yes Gladstone Lighter, MD  carvedilol (COREG) 25 MG tablet Take 1 tablet (25 mg total) by mouth 2 (two) times daily with a meal. 01/10/15  Yes Gladstone Lighter, MD  Cholecalciferol (VITAMIN D) 2000 units tablet Take 2,000 Units by mouth daily.   Yes [provider]  insulin aspart protamine- aspart (NOVOLOG MIX 70/30) (70-30) 100 UNIT/ML injection Inject 0.35 mLs (35 Units total) into the skin 2 (two) times daily with a meal. Patient taking differently: Inject 40 Units into the skin 2 (two) times daily with a meal.  01/10/15  Yes Gladstone Lighter, MD  lisinopril (PRINIVIL,ZESTRIL) 20 MG tablet Take 20 mg by mouth daily. 02/02/17  Yes [provider]  nitroGLYCERIN (NITROSTAT) 0.4 MG SL tablet Place 1 tablet (0.4 mg total) under the tongue every 5 (five) minutes x 3 doses as needed for chest pain. 12/19/14  Yes Charolette Forward, MD  omeprazole (PRILOSEC) 20 MG capsule Take 1 capsule (20 mg total) by mouth daily. 01/10/15  Yes Gladstone Lighter, MD  prasugrel (EFFIENT) 10 MG TABS tablet Take 1 tablet (10 mg total) by mouth daily. 01/10/15  Yes Gladstone Lighter, MD    Review of Systems  Constitutional: Positive for fatigue. Negative for appetite change.  HENT: Negative for congestion, postnasal drip and sore throat.   Respiratory: Positive for shortness of breath. Negative for cough and chest tightness.   Cardiovascular: Negative for chest pain, palpitations  and leg swelling.  Gastrointestinal: Negative for abdominal distention and abdominal pain.  Endocrine: Negative.   Genitourinary: Negative.   Musculoskeletal: Negative for back pain and neck pain.  Skin: Negative.   Allergic/Immunologic: Negative.   Neurological: Negative for dizziness and light-headedness.  Hematological: Negative for adenopathy. Does not bruise/bleed easily.  Psychiatric/Behavioral: Positive for sleep disturbance (restless sleep pattern). Negative for dysphoric mood. The patient is not nervous/anxious.    Vitals:   05/06/17 0940  BP: (!) 155/67  Pulse: 77  Resp: 20  SpO2: 97%  Weight: 240 lb (108.9 kg)  Height: 5\' 10"  (1.778 m)   Wt Readings from Last 3 Encounters:  05/06/17 240 lb (108.9 kg)  04/17/17 243 lb (110.2 kg)  04/08/17 243 lb (110.2 kg)   Lab Results  Component Value Date   CREATININE 4.94 (H) 03/25/2017   CREATININE 3.01 (H) 02/10/2017   CREATININE 4.71 (H) 01/20/2017    Physical Exam  Constitutional: He is oriented to person, place, and time. He appears well-developed and well-nourished.  HENT:  Head: Normocephalic and atraumatic.  Neck: Normal range of motion. Neck supple. No JVD present.  Cardiovascular: Normal rate and regular rhythm.   Pulmonary/Chest: Effort normal. He has no wheezes. He has no rales.  Abdominal: Soft. He exhibits no distension. There is no tenderness.  Musculoskeletal: He exhibits no edema or tenderness.  Neurological: He is alert and oriented to person, place, and time.  Skin: Skin is warm and dry.  Psychiatric: He has a normal mood and affect. His behavior is normal. Thought content normal.  Nursing note and vitals reviewed.   Assessment & Plan:  1. Chronic heart failure with preserved ejection fraction - NYHA class II - euvolumic - Not adding salt to foods. Encouraged a < 2000 mg sodium diet.  - weighing daily and weight has been stable. Reminded him to call for overnight weight gain of >2 pounds or weekly  weight gain of >5 pounds - saw cardiologist ([place]) 10/21/16  2. T2DM - checking 3x daily; fasting this morning was 140 - 11/13/16 A1c 7.0% - continue 40 units BID of Novolog 70/30  3. HTN - BP ok this morning - checks BP frequently at home with arm cuff - on carvedilol and lisinopril  - BMP on 03/25/17 reviewed and shows sodium 142, potassium 4.1 and GFR 12  4. CKD - SCr on 03/25/17 was 4.94 - on dialysis M/W/F - will be starting home dialysis in the near future; access site has already been placed in the abdomen  Medication bottles were reviewed.   Return to clinic in 3 months or sooner if needed.

## 2017-05-07 ENCOUNTER — Encounter: Payer: Self-pay | Admitting: Family

## 2017-05-31 DIAGNOSIS — E872 Acidosis, unspecified: Secondary | ICD-10-CM | POA: Insufficient documentation

## 2017-05-31 DIAGNOSIS — D631 Anemia in chronic kidney disease: Secondary | ICD-10-CM | POA: Insufficient documentation

## 2017-05-31 DIAGNOSIS — E440 Moderate protein-calorie malnutrition: Secondary | ICD-10-CM | POA: Insufficient documentation

## 2017-05-31 DIAGNOSIS — E7849 Other hyperlipidemia: Secondary | ICD-10-CM | POA: Insufficient documentation

## 2017-05-31 DIAGNOSIS — D513 Other dietary vitamin B12 deficiency anemia: Secondary | ICD-10-CM | POA: Insufficient documentation

## 2017-05-31 DIAGNOSIS — E878 Other disorders of electrolyte and fluid balance, not elsewhere classified: Secondary | ICD-10-CM | POA: Insufficient documentation

## 2017-05-31 DIAGNOSIS — N2581 Secondary hyperparathyroidism of renal origin: Secondary | ICD-10-CM | POA: Insufficient documentation

## 2017-05-31 DIAGNOSIS — E875 Hyperkalemia: Secondary | ICD-10-CM | POA: Insufficient documentation

## 2017-05-31 DIAGNOSIS — K769 Liver disease, unspecified: Secondary | ICD-10-CM | POA: Insufficient documentation

## 2017-05-31 DIAGNOSIS — D509 Iron deficiency anemia, unspecified: Secondary | ICD-10-CM | POA: Insufficient documentation

## 2017-05-31 DIAGNOSIS — R17 Unspecified jaundice: Secondary | ICD-10-CM | POA: Insufficient documentation

## 2017-05-31 DIAGNOSIS — N2589 Other disorders resulting from impaired renal tubular function: Secondary | ICD-10-CM | POA: Insufficient documentation

## 2017-05-31 DIAGNOSIS — Z79899 Other long term (current) drug therapy: Secondary | ICD-10-CM | POA: Insufficient documentation

## 2017-06-02 DIAGNOSIS — S98131A Complete traumatic amputation of one right lesser toe, initial encounter: Secondary | ICD-10-CM | POA: Diagnosis present

## 2017-06-02 DIAGNOSIS — I2510 Atherosclerotic heart disease of native coronary artery without angina pectoris: Secondary | ICD-10-CM | POA: Insufficient documentation

## 2017-06-02 DIAGNOSIS — J45998 Other asthma: Secondary | ICD-10-CM | POA: Insufficient documentation

## 2017-06-02 DIAGNOSIS — Z1331 Encounter for screening for depression: Secondary | ICD-10-CM | POA: Insufficient documentation

## 2017-06-02 DIAGNOSIS — M1380 Other specified arthritis, unspecified site: Secondary | ICD-10-CM | POA: Insufficient documentation

## 2017-06-02 DIAGNOSIS — K2100 Gastro-esophageal reflux disease with esophagitis, without bleeding: Secondary | ICD-10-CM | POA: Insufficient documentation

## 2017-07-10 ENCOUNTER — Emergency Department
Admission: EM | Admit: 2017-07-10 | Discharge: 2017-07-11 | Disposition: A | Discharge status: Home / Self Care | Payer: Medicare Other | Attending: Emergency Medicine | Admitting: Emergency Medicine

## 2017-07-10 ENCOUNTER — Encounter: Payer: Self-pay | Admitting: Emergency Medicine

## 2017-07-10 ENCOUNTER — Other Ambulatory Visit: Payer: Self-pay

## 2017-07-10 DIAGNOSIS — I509 Heart failure, unspecified: Secondary | ICD-10-CM | POA: Diagnosis not present

## 2017-07-10 DIAGNOSIS — Z992 Dependence on renal dialysis: Secondary | ICD-10-CM | POA: Insufficient documentation

## 2017-07-10 DIAGNOSIS — Z79899 Other long term (current) drug therapy: Secondary | ICD-10-CM | POA: Diagnosis not present

## 2017-07-10 DIAGNOSIS — E119 Type 2 diabetes mellitus without complications: Secondary | ICD-10-CM | POA: Diagnosis not present

## 2017-07-10 DIAGNOSIS — I130 Hypertensive heart and chronic kidney disease with heart failure and stage 1 through stage 4 chronic kidney disease, or unspecified chronic kidney disease: Secondary | ICD-10-CM | POA: Diagnosis not present

## 2017-07-10 DIAGNOSIS — J45909 Unspecified asthma, uncomplicated: Secondary | ICD-10-CM | POA: Diagnosis not present

## 2017-07-10 DIAGNOSIS — R1084 Generalized abdominal pain: Secondary | ICD-10-CM | POA: Diagnosis not present

## 2017-07-10 DIAGNOSIS — Z87891 Personal history of nicotine dependence: Secondary | ICD-10-CM | POA: Diagnosis not present

## 2017-07-10 DIAGNOSIS — N184 Chronic kidney disease, stage 4 (severe): Secondary | ICD-10-CM | POA: Insufficient documentation

## 2017-07-10 DIAGNOSIS — K5900 Constipation, unspecified: Secondary | ICD-10-CM | POA: Insufficient documentation

## 2017-07-10 LAB — URINALYSIS, COMPLETE (UACMP) WITH MICROSCOPIC
Bilirubin Urine: NEGATIVE
GLUCOSE, UA: 50 mg/dL — AB
HGB URINE DIPSTICK: NEGATIVE
Ketones, ur: NEGATIVE mg/dL
LEUKOCYTES UA: NEGATIVE
Nitrite: NEGATIVE
PH: 6 (ref 5.0–8.0)
Specific Gravity, Urine: 1.012 (ref 1.005–1.030)

## 2017-07-10 LAB — COMPREHENSIVE METABOLIC PANEL
ALK PHOS: 80 U/L (ref 38–126)
ALT: 18 U/L (ref 17–63)
ANION GAP: 12 (ref 5–15)
AST: 20 U/L (ref 15–41)
Albumin: 3.7 g/dL (ref 3.5–5.0)
BILIRUBIN TOTAL: 0.5 mg/dL (ref 0.3–1.2)
BUN: 61 mg/dL — ABNORMAL HIGH (ref 6–20)
CALCIUM: 7.5 mg/dL — AB (ref 8.9–10.3)
CO2: 23 mmol/L (ref 22–32)
Chloride: 104 mmol/L (ref 101–111)
Creatinine, Ser: 7.67 mg/dL — ABNORMAL HIGH (ref 0.61–1.24)
GFR, EST AFRICAN AMERICAN: 8 mL/min — AB (ref >60)
GFR, EST NON AFRICAN AMERICAN: 7 mL/min — AB (ref >60)
Glucose, Bld: 118 mg/dL — ABNORMAL HIGH (ref 65–99)
POTASSIUM: 4.8 mmol/L (ref 3.5–5.1)
Sodium: 139 mmol/L (ref 135–145)
TOTAL PROTEIN: 7.3 g/dL (ref 6.5–8.1)

## 2017-07-10 LAB — CBC
HEMATOCRIT: 33.6 % — AB (ref 40.0–52.0)
Hemoglobin: 10.9 g/dL — ABNORMAL LOW (ref 13.0–18.0)
MCH: 29.5 pg (ref 26.0–34.0)
MCHC: 32.5 g/dL (ref 32.0–36.0)
MCV: 90.6 fL (ref 80.0–100.0)
PLATELETS: 269 10*3/uL (ref 150–440)
RBC: 3.7 MIL/uL — ABNORMAL LOW (ref 4.40–5.90)
RDW: 16.9 % — AB (ref 11.5–14.5)
WBC: 8.1 10*3/uL (ref 3.8–10.6)

## 2017-07-10 LAB — LIPASE, BLOOD: Lipase: 52 U/L — ABNORMAL HIGH (ref 11–51)

## 2017-07-10 MED ORDER — IOPAMIDOL (ISOVUE-300) INJECTION 61%
15.0000 mL | INTRAVENOUS | Status: AC
  Administered 2017-07-10: 15 mL via ORAL

## 2017-07-10 NOTE — ED Triage Notes (Signed)
Pt ambulatory to triage in NAD, reports peritoneal dialysis at home, pain to LLQ since yesterday around dialysis catheter, denies any change in fluid drainage, denies any redness/swelling to site, reports site is tender.  No fever in triage.  Denies any other sx except pain.

## 2017-07-10 NOTE — ED Provider Notes (Signed)
[facility] Emergency Department Provider Note  ____________________________________________   First MD Initiated Contact with Patient 07/10/17 2259     (approximate)  I have reviewed the triage vital signs and the nursing notes.   HISTORY  Chief Complaint Abdominal Pain    HPI Scott [name] is a 56 y.o. male with chronic renal failure who started peritoneal dialysis approximately 2 months ago but still has his temporary dialysis catheter in the right side of his neck as well.  He is a patient of Dr. Juleen China.  He presents by private vehicle for evaluation of 3 days of gradually worsening generalized abdominal pain that is a little bit worse on the left lower quadrant.  He states that it feels like a "pulling" sensation.  It is a little bit worse when he drains off the fluid and better when he is at rest.  It is gradually gotten worse and is now at least moderate in severity.  He denies nausea, vomiting, chest pain, shortness of breath, fever/chills, and dysuria (he does still produce urine).  He reports some issues with variable bowel habits over the last few weeks; he describes several days of diarrhea, then he will have normal bowel movements, then I will have some constipation.  He reports that he currently feels constipated.  Past Medical History:  Diagnosis Date  . Arthritis    "left arm; right leg" (12/13/2014)  . Asthma   . CHF (congestive heart failure) ([place])   . Chronic disease anemia    [name] 12/13/2014  . Chronic kidney disease (CKD), stage IV (severe) ([place])    [name] 12/13/2014... on dialysis  . Coronary artery disease    [name] 12/13/2014  . Depression   . Dysrhythmia    patient unaware of irregular heartbeat  . GERD (gastroesophageal reflux disease)   . High cholesterol    [name] 12/13/2014  . Hypertension   . Non-Q wave myocardial infarction ([place])    [name] 12/13/2014  . PVD (peripheral vascular disease) ([place])    [name] 12/13/2014  .  Type II diabetes mellitus [facility])     Patient Active Problem List   Diagnosis Date Noted  . End stage renal disease ([place]) 11/25/2016  . Tobacco abuse 11/25/2016  . CHF (congestive heart failure) ([place]) 11/13/2016  . Diabetes ([place]) 09/17/2015  . Decreased renal function 05/23/2015  . GERD (gastroesophageal reflux disease) 04/04/2015  . Hypertension 02/26/2015    Past Surgical History:  Procedure Laterality Date  . CARDIAC CATHETERIZATION  11/2014  . INCISION AND DRAINAGE OF WOUND Right ~ 10/2014   "4th toe foot"  . TOE AMPUTATION Right 2015   4th toe    Prior to Admission medications   Medication Sig Start Date End Date Taking? Authorizing Provider  albuterol (PROVENTIL HFA;VENTOLIN HFA) 108 (90 BASE) MCG/ACT inhaler Inhale 2 puffs into the lungs every 6 (six) hours as needed for wheezing or shortness of breath. 01/10/15   Gladstone Lighter, MD  aspirin EC 81 MG tablet Take 1 tablet (81 mg total) by mouth daily. 01/10/15   Gladstone Lighter, MD  atorvastatin (LIPITOR) 80 MG tablet Take 1 tablet (80 mg total) by mouth daily at 6 PM. 01/10/15   Gladstone Lighter, MD  carvedilol (COREG) 25 MG tablet Take 1 tablet (25 mg total) by mouth 2 (two) times daily with a meal. 01/10/15   Gladstone Lighter, MD  cephALEXin (KEFLEX) 500 MG capsule Take 1 capsule (500 mg total) 2 (two) times daily for 10 days by  mouth. 07/11/17 07/21/17  Hinda Kehr, MD  Cholecalciferol (VITAMIN D) 2000 units tablet Take 2,000 Units by mouth daily.    [provider]  insulin aspart protamine- aspart (NOVOLOG MIX 70/30) (70-30) 100 UNIT/ML injection Inject 0.35 mLs (35 Units total) into the skin 2 (two) times daily with a meal. Patient taking differently: Inject 40 Units into the skin 2 (two) times daily with a meal.  01/10/15   Gladstone Lighter, MD  lisinopril (PRINIVIL,ZESTRIL) 20 MG tablet Take 20 mg by mouth daily. 02/02/17   [provider]  nitroGLYCERIN (NITROSTAT) 0.4 MG SL tablet Place 1  tablet (0.4 mg total) under the tongue every 5 (five) minutes x 3 doses as needed for chest pain. 12/19/14   Charolette Forward, MD  omeprazole (PRILOSEC) 20 MG capsule Take 1 capsule (20 mg total) by mouth daily. 01/10/15   Gladstone Lighter, MD  prasugrel (EFFIENT) 10 MG TABS tablet Take 1 tablet (10 mg total) by mouth daily. 01/10/15   Gladstone Lighter, MD  senna-docusate (SENOKOT-S) 8.6-50 MG tablet Take 2 tablets daily by mouth. 07/11/17 07/11/18  Hinda Kehr, MD    Allergies Patient has no known allergies.  Family History  Problem Relation Age of Onset  . Cancer Mother        Lung  . Cancer Father        Lung  . Diabetes Sister   . Diabetes Brother   . CAD Brother   . Hernia Son   . Cancer Brother     Social History Social History   Tobacco Use  . Smoking status: Former Smoker    Packs/day: 0.00    Years: 30.00    Pack years: 0.00    Types: Cigarettes    Start date: 08/31/1984    Last attempt to quit: 11/30/2014    Years since quitting: 2.6  . Smokeless tobacco: Never Used  Substance Use Topics  . Alcohol use: Yes    Comment: Rarely, twice a year.  . Drug use: No    Review of Systems Constitutional: No fever/chills Cardiovascular: Denies chest pain. Respiratory: Denies shortness of breath. Gastrointestinal: Pain worse in the left lower quadrant.  Current constipation but recent variable bowel habits.  No nausea nor vomiting. Genitourinary: Negative for dysuria. Musculoskeletal: Negative for neck pain.  Negative for back pain. Integumentary: Negative for rash. Neurological: Negative for headaches, focal weakness or numbness.   ____________________________________________   PHYSICAL EXAM:  VITAL SIGNS: ED Triage Vitals [07/10/17 1908]  Enc Vitals Group     BP (!) 178/101     Pulse Rate 76     Resp 16     Temp 97.8 F (36.6 C)     Temp Source Oral     SpO2 99 %     Weight 108.9 kg (240 lb)     Height 1.753 m (5\' 9" )     Head Circumference      Peak  Flow      Pain Score 3     Pain Loc      Pain Edu?      Excl. in [place]?     Constitutional: Alert and oriented. Well appearing and in no acute distress. Eyes: Conjunctivae are normal.  Cardiovascular: Normal rate, regular rhythm. Good peripheral circulation. Grossly normal heart sounds. Respiratory: Normal respiratory effort.  No retractions. Lungs CTAB. Gastrointestinal: Mildly distended, peritoneal dialysis catheter is in place and the site is well-appearing.  Generalized mild to moderate tenderness throughout the abdomen, no rebound or  guarding. Musculoskeletal: No lower extremity tenderness nor edema. No gross deformities of extremities. Neurologic:  Normal speech and language. No gross focal neurologic deficits are appreciated.  Skin:  Skin is warm, dry and intact. No rash noted. Psychiatric: Mood and affect are normal. Speech and behavior are normal.  ____________________________________________   LABS (all labs ordered are listed, but only abnormal results are displayed)  Labs Reviewed  LIPASE, BLOOD - Abnormal; Notable for the following components:      Result Value   Lipase 52 (*)    All other components within normal limits  COMPREHENSIVE METABOLIC PANEL - Abnormal; Notable for the following components:   Glucose, Bld 118 (*)    BUN 61 (*)    Creatinine, Ser 7.67 (*)    Calcium 7.5 (*)    GFR calc non Af Amer 7 (*)    GFR calc Af Amer 8 (*)    All other components within normal limits  URINALYSIS, COMPLETE (UACMP) WITH MICROSCOPIC - Abnormal; Notable for the following components:   Color, Urine YELLOW (*)    APPearance CLEAR (*)    Glucose, UA 50 (*)    Protein, ur >=300 (*)    Bacteria, UA RARE (*)    Squamous Epithelial / LPF 0-5 (*)    All other components within normal limits  CBC - Abnormal; Notable for the following components:   RBC 3.70 (*)    Hemoglobin 10.9 (*)    HCT 33.6 (*)    RDW 16.9 (*)    All other components within normal limits  BODY FLUID  CELL COUNT WITH DIFFERENTIAL - Abnormal; Notable for the following components:   Color, Fluid COLORLESS (*)    All other components within normal limits  BODY FLUID CULTURE  PATHOLOGIST SMEAR REVIEW   ____________________________________________  EKG  None - EKG not ordered by ED physician ____________________________________________  RADIOLOGY   Ct Abdomen Pelvis Wo Contrast  Result Date: 07/11/2017 CLINICAL DATA:  Acute onset of left lower quadrant abdominal pain. Patient on peritoneal dialysis. EXAM: CT ABDOMEN AND PELVIS WITHOUT CONTRAST TECHNIQUE: Multidetector CT imaging of the abdomen and pelvis was performed following the standard protocol without IV contrast. COMPARISON:  Renal ultrasound performed 11/16/2016 FINDINGS: Lower chest: Diffuse coronary artery calcifications are seen. The visualized lung bases are clear. Hepatobiliary: The liver is grossly unremarkable in appearance. A stone is noted within the gallbladder. The gallbladder is otherwise unremarkable, though difficult to fully assess given underlying ascites. The common bile duct is normal in caliber. Pancreas: The pancreas is within normal limits. Spleen: The spleen is unremarkable in appearance. Adrenals/Urinary Tract: The adrenal glands are unremarkable in appearance. The kidneys are grossly unremarkable, with scattered bilateral renal vascular calcifications. Nonspecific perinephric stranding is noted bilaterally. There is no evidence of hydronephrosis. No renal or ureteral stones are identified. Stomach/Bowel: The stomach is unremarkable in appearance. The small bowel is within normal limits. The appendix is normal in caliber, without evidence of appendicitis. The colon is unremarkable in appearance. Vascular/Lymphatic: Scattered calcification is seen along the abdominal aorta and its branches. The abdominal aorta is otherwise grossly unremarkable. The inferior vena cava is grossly unremarkable. No retroperitoneal  lymphadenopathy is seen. No pelvic sidewall lymphadenopathy is identified. Reproductive: The bladder is mildly distended and grossly unremarkable. The prostate is mildly enlarged, measuring 5.0 cm in transverse dimension. Other: A peritoneal dialysis catheter is noted at the left lower quadrant. Small to moderate volume ascites is noted within the abdomen and pelvis. Musculoskeletal: No acute osseous  abnormalities are identified. The visualized musculature is unremarkable in appearance. IMPRESSION: 1. No acute abnormality seen to explain the patient's symptoms. 2. Small to moderate volume ascites within the abdomen and pelvis. Peritoneal dialysis catheter at the left lower quadrant. 3. Diffuse coronary artery calcifications seen. 4. Cholelithiasis.  Gallbladder otherwise unremarkable. 5. Scattered aortic atherosclerosis. 6. Mildly enlarged prostate. Electronically Signed   By: Garald Balding M.D.   On: 07/11/2017 02:12    ____________________________________________   PROCEDURES  Critical Care performed: No   Procedure(s) performed:   Procedures   ____________________________________________   INITIAL IMPRESSION / ASSESSMENT AND PLAN / ED COURSE  As part of my medical decision making, I reviewed the following data within the [place] notes reviewed and incorporated, Labs reviewed  and A consult was requested and obtained from this/these consultant(s) (nephrology, Dr. Juleen China)    Differential diagnosis includes, but is not limited to, SBP, acute appendicitis, renal colic, testicular torsion, urinary tract infection/pyelonephritis, prostatitis,  epididymitis, diverticulitis, small bowel obstruction or ileus, constipation, colitis, abdominal aortic aneurysm, gastroenteritis, hernia, other PD catheter complication, etc. I am reassured by the patient's  Essentially normal vital signs other than hypertension and his normal lab work other than an anticipated elevation in  his BUN and creatinine.  He has no leukocytosis and his urinalysis is unremarkable.  He has a very slight elevation of lipase that is nonspecific and not necessarily indicative of appendicitis.  He may benefit from a CT scan but I will first speak with Dr. Juleen China by phone to see if he agrees with my plan for obtaining peritoneal dialysis fluid, plus or minus imaging.  Patient in no acute distress at this time.  Clinical Course as of Jul 11 804  Sat Jul 10, 2017  2315 Spoke by phone with Dr. Juleen China.  He is going to call the dialysis nurse who will come to the ED and drain peritoneal fluid so we can send it for cell count, culture, and Gram stain.  He said that we do not need to contact the nurse because he will do so.  I let the patient's nurse, Wells Guiles, know the plan so she can coordinate with the dialysis nurse.  Dr. Juleen China also request a CT abdomen/pelvis with PO contrast only, which I have ordered.  I will update the patient.  Dr. Juleen China requested that I let him know the results of the CT and the cell count, even if they are reassuring.  [CF]  Sun Jul 11, 2017  2952 Cell count reassuring, and CT scan is non-acute.  Discussed again with Dr. Juleen China who is comfortable with discharge and close outpatient follow up.  He recommended starting Keflex 500 mg PO BID, and he will help coordinate close follow up.  At this point the patient is stable and has been in the ED for a total of 10 hours, which is reassuring.  Received first dose of Keflex.  Also recommending good bowel regimen to help with constipation.  Discussed with patient, gave usual/customary return precautions.  [CF]    Clinical Course User Index [CF] Hinda Kehr, MD    ____________________________________________  FINAL CLINICAL IMPRESSION(S) / ED DIAGNOSES  Final diagnoses:  Generalized abdominal pain  Constipation, unspecified constipation type  Peritoneal dialysis status [facility])     MEDICATIONS GIVEN DURING THIS  VISIT:  Medications  iopamidol (ISOVUE-300) 61 % injection 15 mL (15 mLs Oral Contrast Given 07/10/17 2323)  cephALEXin (KEFLEX) capsule 500 mg (500 mg Oral Given 07/11/17 0535)  acetaminophen (TYLENOL) tablet 1,000 mg (1,000 mg Oral Given 07/11/17 0534)     ED Discharge Orders        Ordered    cephALEXin (KEFLEX) 500 MG capsule  2 times daily     07/11/17 0447    senna-docusate (SENOKOT-S) 8.6-50 MG tablet  Daily     07/11/17 0447       Note:  This document was prepared using Dragon voice recognition software and may include unintentional dictation errors.    Hinda Kehr, MD 07/11/17 [phone]

## 2017-07-11 ENCOUNTER — Emergency Department: Payer: Medicare Other

## 2017-07-11 LAB — BODY FLUID CELL COUNT WITH DIFFERENTIAL
EOS FL: 1 %
Lymphs, Fluid: 29 %
MONOCYTE-MACROPHAGE-SEROUS FLUID: 59 %
Neutrophil Count, Fluid: 11 %
Other Cells, Fluid: 0 %
Total Nucleated Cell Count, Fluid: 58 cu mm

## 2017-07-11 MED ORDER — CEPHALEXIN 500 MG PO CAPS
500.0000 mg | ORAL_CAPSULE | Freq: Once | ORAL | Status: AC
  Administered 2017-07-11: 500 mg via ORAL
  Filled 2017-07-11: qty 1

## 2017-07-11 MED ORDER — CEPHALEXIN 500 MG PO CAPS: 500.0000 mg | ORAL_CAPSULE | Freq: Two times a day (BID) | ORAL | 0 refills | Status: AC

## 2017-07-11 MED ORDER — ACETAMINOPHEN 500 MG PO TABS
1000.0000 mg | ORAL_TABLET | Freq: Once | ORAL | Status: AC
  Administered 2017-07-11: 1000 mg via ORAL
  Filled 2017-07-11: qty 2

## 2017-07-11 MED ORDER — SENNOSIDES-DOCUSATE SODIUM 8.6-50 MG PO TABS: 2.0000 | ORAL_TABLET | Freq: Every day | ORAL | 11 refills | Status: DC

## 2017-07-11 NOTE — Progress Notes (Signed)
Drain approx 500 cc solution from patients peritoneum and submitted to lab for testing.  Effluent clear to slightly yellowish with scant amount of fibrin visible.

## 2017-07-11 NOTE — ED Notes (Signed)
Pt sleeping in room at this time in NAD.

## 2017-07-11 NOTE — Discharge Instructions (Signed)
You have been seen in the Emergency Department (ED) for abdominal pain.  Your evaluation did not identify a clear cause of your symptoms but was generally reassuring.  Your symptoms may be from constipation, and we encourage you to take the laxatives prescribed (senna-docusate).  Additionally, Dr. Juleen China wants you to take the prescribed antibiotic and follow up with him in clinic as soon as possible.  Please follow up as instructed above regarding today?s emergent visit and the symptoms that are bothering you.  For pain, we recommend you take Tylenol 1000 mg every six hours.  Taking stronger pain medications can worsen your constipation and make you feel worse.  If your pain is uncontrolled, please return immediately to the Emergency Department.  Return to the ED if your abdominal pain worsens or fails to improve, you develop bloody vomiting, bloody diarrhea, you are unable to tolerate fluids due to vomiting, fever greater than 101, or other symptoms that concern you.

## 2017-07-12 LAB — PATHOLOGIST SMEAR REVIEW

## 2017-07-14 LAB — BODY FLUID CULTURE
Culture: NO GROWTH
Gram Stain: NONE SEEN
SPECIAL REQUESTS: NORMAL

## 2017-07-19 ENCOUNTER — Other Ambulatory Visit (INDEPENDENT_AMBULATORY_CARE_PROVIDER_SITE_OTHER): Payer: Self-pay | Admitting: Vascular Surgery

## 2017-07-26 ENCOUNTER — Encounter
Admission: RE | Disposition: A | Discharge status: Home / Self Care | Payer: Self-pay | Source: Ambulatory Visit | Attending: Vascular Surgery

## 2017-07-26 ENCOUNTER — Encounter: Payer: Self-pay | Admitting: Emergency Medicine

## 2017-07-26 ENCOUNTER — Ambulatory Visit
Admission: RE | Admit: 2017-07-26 | Discharge: 2017-07-26 | Disposition: A | Discharge status: Home / Self Care | Payer: Medicare Other | Source: Ambulatory Visit | Attending: Vascular Surgery | Admitting: Vascular Surgery

## 2017-07-26 DIAGNOSIS — T82868A Thrombosis of vascular prosthetic devices, implants and grafts, initial encounter: Secondary | ICD-10-CM | POA: Diagnosis not present

## 2017-07-26 DIAGNOSIS — Z992 Dependence on renal dialysis: Secondary | ICD-10-CM | POA: Diagnosis not present

## 2017-07-26 DIAGNOSIS — N186 End stage renal disease: Secondary | ICD-10-CM | POA: Insufficient documentation

## 2017-07-26 DIAGNOSIS — Z801 Family history of malignant neoplasm of trachea, bronchus and lung: Secondary | ICD-10-CM | POA: Diagnosis not present

## 2017-07-26 DIAGNOSIS — Z452 Encounter for adjustment and management of vascular access device: Secondary | ICD-10-CM | POA: Diagnosis present

## 2017-07-26 DIAGNOSIS — E7800 Pure hypercholesterolemia, unspecified: Secondary | ICD-10-CM | POA: Diagnosis not present

## 2017-07-26 DIAGNOSIS — Z89421 Acquired absence of other right toe(s): Secondary | ICD-10-CM | POA: Insufficient documentation

## 2017-07-26 DIAGNOSIS — I252 Old myocardial infarction: Secondary | ICD-10-CM | POA: Insufficient documentation

## 2017-07-26 DIAGNOSIS — I509 Heart failure, unspecified: Secondary | ICD-10-CM | POA: Diagnosis not present

## 2017-07-26 DIAGNOSIS — E1151 Type 2 diabetes mellitus with diabetic peripheral angiopathy without gangrene: Secondary | ICD-10-CM | POA: Insufficient documentation

## 2017-07-26 DIAGNOSIS — Z87891 Personal history of nicotine dependence: Secondary | ICD-10-CM | POA: Insufficient documentation

## 2017-07-26 DIAGNOSIS — Z809 Family history of malignant neoplasm, unspecified: Secondary | ICD-10-CM | POA: Insufficient documentation

## 2017-07-26 DIAGNOSIS — Z8249 Family history of ischemic heart disease and other diseases of the circulatory system: Secondary | ICD-10-CM | POA: Insufficient documentation

## 2017-07-26 DIAGNOSIS — I132 Hypertensive heart and chronic kidney disease with heart failure and with stage 5 chronic kidney disease, or end stage renal disease: Secondary | ICD-10-CM | POA: Diagnosis not present

## 2017-07-26 DIAGNOSIS — Z9889 Other specified postprocedural states: Secondary | ICD-10-CM | POA: Diagnosis not present

## 2017-07-26 DIAGNOSIS — Z955 Presence of coronary angioplasty implant and graft: Secondary | ICD-10-CM | POA: Insufficient documentation

## 2017-07-26 DIAGNOSIS — I2510 Atherosclerotic heart disease of native coronary artery without angina pectoris: Secondary | ICD-10-CM | POA: Insufficient documentation

## 2017-07-26 DIAGNOSIS — E11649 Type 2 diabetes mellitus with hypoglycemia without coma: Secondary | ICD-10-CM | POA: Insufficient documentation

## 2017-07-26 DIAGNOSIS — I10 Essential (primary) hypertension: Secondary | ICD-10-CM | POA: Diagnosis not present

## 2017-07-26 DIAGNOSIS — E119 Type 2 diabetes mellitus without complications: Secondary | ICD-10-CM | POA: Diagnosis not present

## 2017-07-26 DIAGNOSIS — Z833 Family history of diabetes mellitus: Secondary | ICD-10-CM | POA: Insufficient documentation

## 2017-07-26 DIAGNOSIS — E1122 Type 2 diabetes mellitus with diabetic chronic kidney disease: Secondary | ICD-10-CM | POA: Diagnosis not present

## 2017-07-26 DIAGNOSIS — J45909 Unspecified asthma, uncomplicated: Secondary | ICD-10-CM | POA: Diagnosis not present

## 2017-07-26 HISTORY — PX: DIALYSIS/PERMA CATHETER REMOVAL: CATH118289

## 2017-07-26 SURGERY — DIALYSIS/PERMA CATHETER REMOVAL
Anesthesia: LOCAL

## 2017-07-26 MED ORDER — LIDOCAINE-EPINEPHRINE (PF) 1 %-1:200000 IJ SOLN
INTRAMUSCULAR | Status: DC | PRN
  Administered 2017-07-26: 20 mL via INTRADERMAL

## 2017-07-26 SURGICAL SUPPLY — 2 items
FORCEPS HALSTEAD CVD 5IN STRL (INSTRUMENTS) ×2 IMPLANT
TRAY LACERAT/PLASTIC (MISCELLANEOUS) ×2 IMPLANT

## 2017-07-26 NOTE — Op Note (Signed)
Operative Note     Preoperative diagnosis:   1. ESRD with functional permanent access  Postoperative diagnosis:  1. ESRD with functional permanent access  Procedure:  Removal of right jugular Permcath  Surgeon:  Leotis Pain, MD  Anesthesia:  Local  EBL:  Minimal  Indication for the Procedure:  The patient has a functional permanent dialysis access and no longer needs their permcath.  This can be removed.  Risks and benefits are discussed and informed consent is obtained.  Description of the Procedure:  The patient's right neck, chest and existing catheter were sterilely prepped and draped. The area around the catheter was anesthetized copiously with 1% lidocaine. The catheter was dissected out with curved hemostats until the cuff was freed from the surrounding fibrous sheath. The fiber sheath was transected, and the catheter was then removed in its entirety using gentle traction. Pressure was held and sterile dressings were placed. The patient tolerated the procedure well and was taken to the recovery room in stable condition.     Leotis Pain  07/26/2017, 12:00 PM This note was created with Dragon Medical transcription system. Any errors in dictation are purely unintentional.

## 2017-07-26 NOTE — Progress Notes (Signed)
Pt sitting up in bed in NAD, discharge and follow up instructions reviewed w/ pt who verbalizes understanding.

## 2017-07-26 NOTE — H&P (Signed)
[facility] Admission History & Physical  MRN : [id number]  Scott [name] is a 56 y.o. (1960-10-15) male who presents with chief complaint of No chief complaint on file. [name]  History of Present Illness: I am asked to evaluate the patient by the dialysis center. The patient was sent here because they have a nonfunctioning tunneled catheter and a functioning access.  The patient reports they're not been any problems with any of their dialysis runs. They are reporting good flows with good parameters at dialysis.  Patient denies pain or tenderness overlying the access.  There is no pain with dialysis.  The patient denies hand pain or finger pain consistent with steal syndrome.  No fevers or chills while on dialysis.   No current facility-administered medications for this encounter.     Past Medical History:  Diagnosis Date  . Arthritis    "left arm; right leg" (12/13/2014)  . Asthma   . CHF (congestive heart failure) ([place])   . Chronic disease anemia    [name] 12/13/2014  . Chronic kidney disease (CKD), stage IV (severe) ([place])    [name] 12/13/2014... on dialysis  . Coronary artery disease    [name] 12/13/2014  . Depression   . Dysrhythmia    patient unaware of irregular heartbeat  . GERD (gastroesophageal reflux disease)   . High cholesterol    [name] 12/13/2014  . Hypertension   . Non-Q wave myocardial infarction ([place])    [name] 12/13/2014  . PVD (peripheral vascular disease) ([place])    [name] 12/13/2014  . Type II diabetes mellitus ([place])     Past Surgical History:  Procedure Laterality Date  . AV FISTULA PLACEMENT Right 04/07/2017   Procedure: ARTERIOVENOUS (AV) FISTULA CREATION ( BRACHIALCEPHALIC );  Surgeon: Katha Cabal, MD;  Location: [facility];  Service: Vascular;  Laterality: Right;  . CAPD INSERTION N/A 04/07/2017   Procedure: LAPAROSCOPIC INSERTION CONTINUOUS AMBULATORY PERITONEAL DIALYSIS  (CAPD) CATHETER;  Surgeon: Katha Cabal, MD;   Location: [facility] ORS;  Service: Vascular;  Laterality: N/A;  . CARDIAC CATHETERIZATION  11/2014  . DIALYSIS/PERMA CATHETER INSERTION N/A 01/18/2017   Procedure: Dialysis/Perma Catheter Insertion;  Surgeon: Algernon Huxley, MD;  Location: [facility];  Service: Cardiovascular;  Laterality: N/A;  . INCISION AND DRAINAGE OF WOUND Right ~ 10/2014   "4th toe foot"  . PERCUTANEOUS CORONARY STENT INTERVENTION (PCI-S) N/A 12/14/2014   Procedure: PERCUTANEOUS CORONARY STENT INTERVENTION (PCI-S);  Surgeon: Charolette Forward, MD;  Location: [facility] CATH LAB;  Service: Cardiovascular;  Laterality: N/A;  . PERCUTANEOUS CORONARY STENT INTERVENTION (PCI-S) N/A 12/18/2014   Procedure: PERCUTANEOUS CORONARY STENT INTERVENTION (PCI-S);  Surgeon: Charolette Forward, MD;  Location: [facility] CATH LAB;  Service: Cardiovascular;  Laterality: N/A;  . TOE AMPUTATION Right 2015   4th toe    Social History Social History   Tobacco Use  . Smoking status: Former Smoker    Packs/day: 0.00    Years: 30.00    Pack years: 0.00    Types: Cigarettes    Start date: 08/31/1984    Last attempt to quit: 11/30/2014    Years since quitting: 2.6  . Smokeless tobacco: Never Used  Substance Use Topics  . Alcohol use: Yes    Comment: Rarely, twice a year.  . Drug use: No    Family History Family History  Problem Relation Age of Onset  . Cancer Mother        Lung  . Cancer Father  Lung  . Diabetes Sister   . Diabetes Brother   . CAD Brother   . Hernia Son   . Cancer Brother     No family history of bleeding or clotting disorders, autoimmune disease or porphyria  No Known Allergies   REVIEW OF SYSTEMS (Negative unless checked)  Constitutional: [] Weight loss  [] Fever  [] Chills Cardiac: [] Chest pain   [] Chest pressure   [] Palpitations   [] Shortness of breath when laying flat   [] Shortness of breath at rest   [x] Shortness of breath with exertion. Vascular:  [] Pain in legs with walking   [] Pain in legs at rest   [] Pain in legs when  laying flat   [] Claudication   [] Pain in feet when walking  [] Pain in feet at rest  [] Pain in feet when laying flat   [] History of DVT   [] Phlebitis   [x] Swelling in legs   [] Varicose veins   [] Non-healing ulcers Pulmonary:   [] Uses home oxygen   [] Productive cough   [] Hemoptysis   [] Wheeze  [] COPD   [] Asthma Neurologic:  [] Dizziness  [] Blackouts   [] Seizures   [] History of stroke   [] History of TIA  [] Aphasia   [] Temporary blindness   [] Dysphagia   [] Weakness or numbness in arms   [] Weakness or numbness in legs Musculoskeletal:  [] Arthritis   [] Joint swelling   [] Joint pain   [] Low back pain Hematologic:  [] Easy bruising  [] Easy bleeding   [] Hypercoagulable state   [] Anemic  [] Hepatitis Gastrointestinal:  [] Blood in stool   [] Vomiting blood  [] Gastroesophageal reflux/heartburn   [] Difficulty swallowing. Genitourinary:  [x] Chronic kidney disease   [] Difficult urination  [] Frequent urination  [] Burning with urination   [] Blood in urine Skin:  [] Rashes   [] Ulcers   [] Wounds Psychological:  [] History of anxiety   []  History of major depression.  Physical Examination  There were no vitals filed for this visit. There is no height or weight on file to calculate BMI. Gen: WD/WN, NAD Head: [place]/AT, No temporalis wasting. Prominent temp pulse not noted. Ear/Nose/Throat: Hearing grossly intact, nares w/o erythema or drainage, oropharynx w/o Erythema/Exudate,  Eyes: Conjunctiva clear, sclera non-icteric Neck: Trachea midline.  No JVD.  Pulmonary:  Good air movement, respirations not labored, no use of accessory muscles.  Cardiac: RRR, normal S1, S2. Vascular: good thrill in AVF Vessel Right Left  Radial Palpable Palpable              Gastrointestinal: soft, non-tender/non-distended. PD catheter in place Musculoskeletal: M/S 5/5 throughout.  Extremities without ischemic changes.  No deformity or atrophy.  Neurologic: Sensation grossly intact in extremities.  Symmetrical.  Speech is fluent. Motor exam  as listed above. Psychiatric: Judgment intact, Mood & affect appropriate for pt's clinical situation. Dermatologic: No rashes or ulcers noted.  No cellulitis or open wounds.    CBC Lab Results  Component Value Date   WBC 8.1 07/10/2017   HGB 10.9 (L) 07/10/2017   HCT 33.6 (L) 07/10/2017   MCV 90.6 07/10/2017   PLT 269 07/10/2017    BMET    Component Value Date/Time   NA 139 07/10/2017 1909   NA 140 04/16/2015   NA 140 12/13/2014 0102   K 4.8 07/10/2017 1909   K 4.6 12/13/2014 0102   CL 104 07/10/2017 1909   CL 111 12/13/2014 0102   CO2 23 07/10/2017 1909   CO2 23 12/13/2014 0102   GLUCOSE 118 (H) 07/10/2017 1909   GLUCOSE 213 (H) 12/13/2014 0102   BUN 61 (H) 07/10/2017  1909   BUN 44 (A) 04/16/2015   BUN 25 (H) 12/13/2014 0102   CREATININE 7.67 (H) 07/10/2017 1909   CREATININE 1.95 (H) 12/13/2014 0102   CALCIUM 7.5 (L) 07/10/2017 1909   CALCIUM 8.0 (L) 12/13/2014 0102   GFRNONAA 7 (L) 07/10/2017 1909   GFRNONAA 37 (L) 12/13/2014 0102   GFRAA 8 (L) 07/10/2017 1909   GFRAA 43 (L) 12/13/2014 0102   CrCl cannot be calculated (Unknown ideal weight.).  COAG Lab Results  Component Value Date   INR 0.99 03/25/2017   INR 1.08 01/10/2015   INR 1.10 12/14/2014    Radiology Ct Abdomen Pelvis Wo Contrast  Result Date: 07/11/2017 CLINICAL DATA:  Acute onset of left lower quadrant abdominal pain. Patient on peritoneal dialysis. EXAM: CT ABDOMEN AND PELVIS WITHOUT CONTRAST TECHNIQUE: Multidetector CT imaging of the abdomen and pelvis was performed following the standard protocol without IV contrast. COMPARISON:  Renal ultrasound performed 11/16/2016 FINDINGS: Lower chest: Diffuse coronary artery calcifications are seen. The visualized lung bases are clear. Hepatobiliary: The liver is grossly unremarkable in appearance. A stone is noted within the gallbladder. The gallbladder is otherwise unremarkable, though difficult to fully assess given underlying ascites. The common bile  duct is normal in caliber. Pancreas: The pancreas is within normal limits. Spleen: The spleen is unremarkable in appearance. Adrenals/Urinary Tract: The adrenal glands are unremarkable in appearance. The kidneys are grossly unremarkable, with scattered bilateral renal vascular calcifications. Nonspecific perinephric stranding is noted bilaterally. There is no evidence of hydronephrosis. No renal or ureteral stones are identified. Stomach/Bowel: The stomach is unremarkable in appearance. The small bowel is within normal limits. The appendix is normal in caliber, without evidence of appendicitis. The colon is unremarkable in appearance. Vascular/Lymphatic: Scattered calcification is seen along the abdominal aorta and its branches. The abdominal aorta is otherwise grossly unremarkable. The inferior vena cava is grossly unremarkable. No retroperitoneal lymphadenopathy is seen. No pelvic sidewall lymphadenopathy is identified. Reproductive: The bladder is mildly distended and grossly unremarkable. The prostate is mildly enlarged, measuring 5.0 cm in transverse dimension. Other: A peritoneal dialysis catheter is noted at the left lower quadrant. Small to moderate volume ascites is noted within the abdomen and pelvis. Musculoskeletal: No acute osseous abnormalities are identified. The visualized musculature is unremarkable in appearance. IMPRESSION: 1. No acute abnormality seen to explain the patient's symptoms. 2. Small to moderate volume ascites within the abdomen and pelvis. Peritoneal dialysis catheter at the left lower quadrant. 3. Diffuse coronary artery calcifications seen. 4. Cholelithiasis.  Gallbladder otherwise unremarkable. 5. Scattered aortic atherosclerosis. 6. Mildly enlarged prostate. Electronically Signed   By: Garald Balding M.D.   On: 07/11/2017 02:12    Assessment/Plan 1.  Complication dialysis device:  Patient's Tunneled catheter is not being used. The patient has an extremity access that is  functioning well. Therefore, the patient will undergo removal of the tunneled catheter under local anesthesia.  The risks and benefits were described to the patient.  All questions were answered.  The patient agrees to proceed with angiography and intervention. Potassium will be drawn to ensure that it is an appropriate level prior to performing intervention. 2.  End-stage renal disease requiring hemodialysis:  Patient will continue dialysis therapy without further interruption if a successful intervention is not achieved then a tunneled catheter will be placed. Dialysis has already been arranged. 3.  Hypertension:  Patient will continue medical management; nephrology is following no changes in oral medications. 4.  Diabetes mellitus:  Glucose will be monitored and oral  medications been held this morning once the patient has undergone the patient's procedure po intake will be reinitiated and again Accu-Cheks will be used to assess the blood glucose level and treat as needed. The patient will be restarted on the patient's usual hypoglycemic regime 5.  Coronary artery disease:  EKG will be monitored. Nitrates will be used if needed. The patient's oral cardiac medications will be continued.    Leotis Pain, MD  07/26/2017 10:54 AM

## 2017-07-26 NOTE — Discharge Instructions (Signed)
Incision Care, Adult °An incision is a cut that a doctor makes in your skin for surgery (for a procedure). Most times, these cuts are closed after surgery. Your cut from surgery may be closed with stitches (sutures), staples, skin glue, or skin tape (adhesive strips). You may need to return to your doctor to have stitches or staples taken out. This may happen many days or many weeks after your surgery. The cut needs to be well cared for so it does not get infected. °How to care for your cut °Cut care °· Follow instructions from your doctor about how to take care of your cut. Make sure you: °? Wash your hands with soap and water before you change your bandage (dressing). If you cannot use soap and water, use hand sanitizer. °? Change your bandage as told by your doctor. °? Leave stitches, skin glue, or skin tape in place. They may need to stay in place for 2 weeks or longer. If tape strips get loose and curl up, you may trim the loose edges. Do not remove tape strips completely unless your doctor says it is okay. °· Check your cut area every day for signs of infection. Check for: °? More redness, swelling, or pain. °? More fluid or blood. °? Warmth. °? Pus or a bad smell. °· Ask your doctor how to clean the cut. This may include: °? Using mild soap and water. °? Using a clean towel to pat the cut dry after you clean it. °? Putting a cream or ointment on the cut. Do this only as told by your doctor. °? Covering the cut with a clean bandage. °· Ask your doctor when you can leave the cut uncovered. °· Do not take baths, swim, or use a hot tub until your doctor says it is okay. Ask your doctor if you can take showers. You may only be allowed to take sponge baths for bathing. °Medicines °· If you were prescribed an antibiotic medicine, cream, or ointment, take the antibiotic or put it on the cut as told by your doctor. Do not stop taking or putting on the antibiotic even if your condition gets better. °· Take  over-the-counter and prescription medicines only as told by your doctor. °General instructions °· Limit movement around your cut. This helps healing. °? Avoid straining, lifting, or exercise for the first month, or for as long as told by your doctor. °? Follow instructions from your doctor about going back to your normal activities. °? Ask your doctor what activities are safe. °· Protect your cut from the sun when you are outside for the first 6 months, or for as long as told by your doctor. Put on sunscreen around the scar or cover up the scar. °· Keep all follow-up visits as told by your doctor. This is important. °Contact a doctor if: °· Your have more redness, swelling, or pain around the cut. °· You have more fluid or blood coming from the cut. °· Your cut feels warm to the touch. °· You have pus or a bad smell coming from the cut. °· You have a fever or shaking chills. °· You feel sick to your stomach (nauseous) or you throw up (vomit). °· You are dizzy. °· Your stitches or staples come undone. °Get help right away if: °· You have a red streak coming from your cut. °· Your cut bleeds through the bandage and the bleeding does not stop with gentle pressure. °· The edges of your cut open   up and separate. °· You have very bad (severe) pain. °· You have a rash. °· You are confused. °· You pass out (faint). °· You have trouble breathing and you have a fast heartbeat. °This information is not intended to replace advice given to you by your health care provider. Make sure you discuss any questions you have with your health care provider. °Document Released: 11/09/2011 Document Revised: 04/24/2016 Document Reviewed: 04/24/2016 °Elsevier Interactive Patient Education © 2017 [employer] ° °

## 2017-08-05 ENCOUNTER — Other Ambulatory Visit: Payer: Self-pay

## 2017-08-05 ENCOUNTER — Encounter: Payer: Self-pay | Admitting: Family

## 2017-08-05 ENCOUNTER — Ambulatory Visit: Payer: Medicare Other | Attending: Family | Admitting: Family

## 2017-08-05 VITALS — BP 162/80 | HR 79 | Resp 18 | Ht 70.0 in | Wt 249.2 lb

## 2017-08-05 DIAGNOSIS — Z833 Family history of diabetes mellitus: Secondary | ICD-10-CM | POA: Insufficient documentation

## 2017-08-05 DIAGNOSIS — Z7982 Long term (current) use of aspirin: Secondary | ICD-10-CM | POA: Insufficient documentation

## 2017-08-05 DIAGNOSIS — N186 End stage renal disease: Secondary | ICD-10-CM

## 2017-08-05 DIAGNOSIS — Z955 Presence of coronary angioplasty implant and graft: Secondary | ICD-10-CM | POA: Insufficient documentation

## 2017-08-05 DIAGNOSIS — I10 Essential (primary) hypertension: Secondary | ICD-10-CM

## 2017-08-05 DIAGNOSIS — Z801 Family history of malignant neoplasm of trachea, bronchus and lung: Secondary | ICD-10-CM | POA: Insufficient documentation

## 2017-08-05 DIAGNOSIS — E1122 Type 2 diabetes mellitus with diabetic chronic kidney disease: Secondary | ICD-10-CM | POA: Diagnosis not present

## 2017-08-05 DIAGNOSIS — Z794 Long term (current) use of insulin: Secondary | ICD-10-CM | POA: Insufficient documentation

## 2017-08-05 DIAGNOSIS — I509 Heart failure, unspecified: Secondary | ICD-10-CM | POA: Insufficient documentation

## 2017-08-05 DIAGNOSIS — I132 Hypertensive heart and chronic kidney disease with heart failure and with stage 5 chronic kidney disease, or end stage renal disease: Secondary | ICD-10-CM | POA: Diagnosis not present

## 2017-08-05 DIAGNOSIS — Z9889 Other specified postprocedural states: Secondary | ICD-10-CM | POA: Insufficient documentation

## 2017-08-05 DIAGNOSIS — Z79899 Other long term (current) drug therapy: Secondary | ICD-10-CM | POA: Insufficient documentation

## 2017-08-05 DIAGNOSIS — Z992 Dependence on renal dialysis: Secondary | ICD-10-CM

## 2017-08-05 DIAGNOSIS — Z87891 Personal history of nicotine dependence: Secondary | ICD-10-CM | POA: Diagnosis not present

## 2017-08-05 DIAGNOSIS — I5032 Chronic diastolic (congestive) heart failure: Secondary | ICD-10-CM

## 2017-08-05 DIAGNOSIS — K219 Gastro-esophageal reflux disease without esophagitis: Secondary | ICD-10-CM | POA: Diagnosis not present

## 2017-08-05 MED ORDER — HYDRALAZINE HCL 25 MG PO TABS: 25.0000 mg | ORAL_TABLET | Freq: Three times a day (TID) | ORAL | 3 refills | Status: DC

## 2017-08-05 NOTE — Patient Instructions (Signed)
Continue weighing daily and call for an overnight weight gain of > 2 pounds or a weekly weight gain of >5 pounds.  Adding hydralazine three times daily

## 2017-08-05 NOTE — Progress Notes (Signed)
Patient ID: Scott [name], male    DOB: May 02, 1961, 56 y.o.   MRN: [id number]  HPI  Mr. Heintzelman is a 61 yoM with PMH of HTN, ESRD on dialysis M/W/F, T2DM, HLD, CAD, NSTEMI s/p stents 4/17, congestive heart failure with preserved ejection fraction.  Echo on 11/13/16 showed EF of 55-60% similar to echo on 01/09/15 showing EF of 55-65%  Was in the ED 07/10/17 due to abdominal pain. Abdominal/pelvic CT was non-acute. Released home with antibiotic and follow-up with nephrology. Was in the ED 3 times from 04/08/17-04/17/17 due to urinary/catheter issues. Each time he was treated and released. Was in the ED 02/10/17 due to hypotension. Treated and released.    Presents today for a follow-up visit with a chief complaint of mild shortness of breath upon moderate exertion. He describes this as chronic in nature having been present for several years with varying levels of severity. He has associated fatigue and gradual weight gain along with this. He denies any chest pain, cough, edema, palpitations, dizziness, headache or difficulty sleeping.   Past Medical History:  Diagnosis Date  . Arthritis    "left arm; right leg" (12/13/2014)  . Asthma   . CHF (congestive heart failure) ([place])   . Chronic disease anemia    [name] 12/13/2014  . Chronic kidney disease (CKD), stage IV (severe) ([place])    [name] 12/13/2014... on dialysis  . Coronary artery disease    [name] 12/13/2014  . Depression   . Dysrhythmia    patient unaware of irregular heartbeat  . GERD (gastroesophageal reflux disease)   . High cholesterol    [name] 12/13/2014  . Hypertension   . Non-Q wave myocardial infarction ([place])    [name] 12/13/2014  . PVD (peripheral vascular disease) ([place])    [name] 12/13/2014  . Type II diabetes mellitus ([place])    Past Surgical History:  Procedure Laterality Date  . AV FISTULA PLACEMENT Right 04/07/2017   Procedure: ARTERIOVENOUS (AV) FISTULA CREATION ( BRACHIALCEPHALIC );  Surgeon: Katha Cabal, MD;   Location: [facility] ORS;  Service: Vascular;  Laterality: Right;  . CAPD INSERTION N/A 04/07/2017   Procedure: LAPAROSCOPIC INSERTION CONTINUOUS AMBULATORY PERITONEAL DIALYSIS  (CAPD) CATHETER;  Surgeon: Katha Cabal, MD;  Location: [facility] ORS;  Service: Vascular;  Laterality: N/A;  . CARDIAC CATHETERIZATION  11/2014  . DIALYSIS/PERMA CATHETER INSERTION N/A 01/18/2017   Procedure: Dialysis/Perma Catheter Insertion;  Surgeon: Algernon Huxley, MD;  Location: [place] [facility];  Service: Cardiovascular;  Laterality: N/A;  . DIALYSIS/PERMA CATHETER REMOVAL N/A 07/26/2017   Procedure: DIALYSIS/PERMA CATHETER REMOVAL;  Surgeon: Algernon Huxley, MD;  Location: [place] [facility];  Service: Cardiovascular;  Laterality: N/A;  . INCISION AND DRAINAGE OF WOUND Right ~ 10/2014   "4th toe foot"  . PERCUTANEOUS CORONARY STENT INTERVENTION (PCI-S) N/A 12/14/2014   Procedure: PERCUTANEOUS CORONARY STENT INTERVENTION (PCI-S);  Surgeon: Charolette Forward, MD;  Location: [facility] CATH LAB;  Service: Cardiovascular;  Laterality: N/A;  . PERCUTANEOUS CORONARY STENT INTERVENTION (PCI-S) N/A 12/18/2014   Procedure: PERCUTANEOUS CORONARY STENT INTERVENTION (PCI-S);  Surgeon: Charolette Forward, MD;  Location: [facility] CATH LAB;  Service: Cardiovascular;  Laterality: N/A;  . TOE AMPUTATION Right 2015   4th toe   Family History  Problem Relation Age of Onset  . Cancer Mother        Lung  . Cancer Father        Lung  . Diabetes Sister   . Diabetes Brother   . CAD Brother   .  Hernia Son   . Cancer Brother    Social History   Tobacco Use  . Smoking status: Former Smoker    Packs/day: 0.00    Years: 30.00    Pack years: 0.00    Types: Cigarettes    Start date: 08/31/1984    Last attempt to quit: 11/30/2014    Years since quitting: 2.6  . Smokeless tobacco: Never Used  Substance Use Topics  . Alcohol use: Yes    Comment: Rarely, twice a year.   No Known Allergies  Prior to Admission medications   Medication Sig Start Date End Date  Taking? Authorizing Provider  albuterol (PROVENTIL HFA;VENTOLIN HFA) 108 (90 BASE) MCG/ACT inhaler Inhale 2 puffs into the lungs every 6 (six) hours as needed for wheezing or shortness of breath. 01/10/15  Yes Gladstone Lighter, MD  aspirin EC 81 MG tablet Take 1 tablet (81 mg total) by mouth daily. 01/10/15  Yes Gladstone Lighter, MD  atorvastatin (LIPITOR) 80 MG tablet Take 1 tablet (80 mg total) by mouth daily at 6 PM. 01/10/15  Yes Gladstone Lighter, MD  carvedilol (COREG) 25 MG tablet Take 1 tablet (25 mg total) by mouth 2 (two) times daily with a meal. 01/10/15  Yes Gladstone Lighter, MD  insulin aspart protamine- aspart (NOVOLOG MIX 70/30) (70-30) 100 UNIT/ML injection Inject 0.35 mLs (35 Units total) into the skin 2 (two) times daily with a meal. Patient taking differently: Inject 40 Units into the skin 2 (two) times daily with a meal.  01/10/15  Yes Gladstone Lighter, MD  lisinopril (PRINIVIL,ZESTRIL) 20 MG tablet Take 20 mg by mouth daily. 02/02/17  Yes [provider]  nitroGLYCERIN (NITROSTAT) 0.4 MG SL tablet Place 1 tablet (0.4 mg total) under the tongue every 5 (five) minutes x 3 doses as needed for chest pain. 12/19/14  Yes Charolette Forward, MD  omeprazole (PRILOSEC) 20 MG capsule Take 1 capsule (20 mg total) by mouth daily. 01/10/15  Yes Gladstone Lighter, MD  pioglitazone (ACTOS) 15 MG tablet Take 15 mg by mouth daily.   Yes [provider]  prasugrel (EFFIENT) 10 MG TABS tablet Take 1 tablet (10 mg total) by mouth daily. 01/10/15  Yes Gladstone Lighter, MD  senna-docusate (SENOKOT-S) 8.6-50 MG tablet Take 2 tablets daily by mouth. 07/11/17 07/11/18 Yes Hinda Kehr, MD  calcium acetate (PHOSLO) 667 MG capsule Take 2,001 mg by mouth 3 (three) times daily with meals.  06/08/17   [provider]  Cholecalciferol (VITAMIN D) 2000 units tablet Take 2,000 Units by mouth daily.    [provider]    Review of Systems  Constitutional: Positive for fatigue.  Negative for appetite change.  HENT: Negative for congestion, postnasal drip and sore throat.   Respiratory: Positive for shortness of breath. Negative for cough and chest tightness.   Cardiovascular: Negative for chest pain, palpitations and leg swelling.  Gastrointestinal: Negative for abdominal distention and abdominal pain.  Endocrine: Negative.   Genitourinary: Negative.   Musculoskeletal: Negative for back pain and neck pain.  Skin: Negative.   Allergic/Immunologic: Negative.   Neurological: Negative for dizziness, light-headedness and headaches.  Hematological: Negative for adenopathy. Does not bruise/bleed easily.  Psychiatric/Behavioral: Positive for sleep disturbance (restless sleep pattern). Negative for dysphoric mood. The patient is not nervous/anxious.    Vitals:   08/05/17 0928 08/05/17 0954  BP: (!) 185/90 (!) 162/80  Pulse: 79   Resp: 18   SpO2: 100%   Weight: 249 lb 4 oz (113.1 kg)   Height:  5\' 10"  (1.778 m)    Wt Readings from Last 3 Encounters:  08/05/17 249 lb 4 oz (113.1 kg)  07/26/17 240 lb (108.9 kg)  07/10/17 240 lb (108.9 kg)    Lab Results  Component Value Date   CREATININE 7.67 (H) 07/10/2017   CREATININE 4.94 (H) 03/25/2017   CREATININE 3.01 (H) 02/10/2017    Physical Exam  Constitutional: He is oriented to person, place, and time. He appears well-developed and well-nourished.  HENT:  Head: Normocephalic and atraumatic.  Neck: Normal range of motion. Neck supple. No JVD present.  Cardiovascular: Normal rate and regular rhythm.  Pulmonary/Chest: Effort normal. He has no wheezes. He has no rales.  Abdominal: Soft. He exhibits no distension. There is no tenderness.  Musculoskeletal: He exhibits no edema or tenderness.  Neurological: He is alert and oriented to person, place, and time.  Skin: Skin is warm and dry.  Psychiatric: He has a normal mood and affect. His behavior is normal. Thought content normal.  Nursing note and vitals  reviewed.   Assessment & Plan:  1. Chronic heart failure with preserved ejection fraction- - NYHA class II - euvolemic - Not adding salt to foods. Encouraged a < 2000 mg sodium diet.  - weighing daily and weight has gradually risen; Reminded him to call for overnight weight gain of >2 pounds or weekly weight gain of >5 pounds - weight up 9 pounds since he was last here August 2018 - saw cardiologist ([place]) 10/21/16 - patient reports receiving his flu vaccine for the season already  2. Diabetes- - checking 3x daily; fasting this morning was 165 - 11/13/16 A1c 7.0% - continue 40 units BID of Novolog 70/30  3. HTN - BP elevated this morning even after recheck with a manual cuff - checks BP frequently at home with arm cuff & says that it's been elevated ever since he started using this new solution for dialysis - on carvedilol and lisinopril  - will add hydralazine 25mg  TID; could also add furosemide if needed - BMP on 07/10/17 reviewed and shows sodium 139, potassium 4.8 and GFR 7  4. ESRD- - does home dialysis four times daily and for ~ 15 minutes each time  Medication bottles were reviewed.   Return to clinic in 2 weeks or sooner if needed.

## 2017-08-19 ENCOUNTER — Ambulatory Visit: Payer: Medicare Other | Admitting: Family

## 2017-08-30 ENCOUNTER — Ambulatory Visit: Payer: Medicare Other | Attending: Family | Admitting: Family

## 2017-08-30 ENCOUNTER — Encounter: Payer: Self-pay | Admitting: Family

## 2017-08-30 ENCOUNTER — Other Ambulatory Visit: Payer: Self-pay

## 2017-08-30 VITALS — BP 140/63 | HR 74 | Resp 18 | Ht 69.0 in | Wt 243.4 lb

## 2017-08-30 DIAGNOSIS — M1990 Unspecified osteoarthritis, unspecified site: Secondary | ICD-10-CM | POA: Diagnosis not present

## 2017-08-30 DIAGNOSIS — K219 Gastro-esophageal reflux disease without esophagitis: Secondary | ICD-10-CM | POA: Insufficient documentation

## 2017-08-30 DIAGNOSIS — Z794 Long term (current) use of insulin: Secondary | ICD-10-CM | POA: Diagnosis not present

## 2017-08-30 DIAGNOSIS — Z79899 Other long term (current) drug therapy: Secondary | ICD-10-CM | POA: Insufficient documentation

## 2017-08-30 DIAGNOSIS — Z833 Family history of diabetes mellitus: Secondary | ICD-10-CM | POA: Diagnosis not present

## 2017-08-30 DIAGNOSIS — Z87891 Personal history of nicotine dependence: Secondary | ICD-10-CM | POA: Diagnosis not present

## 2017-08-30 DIAGNOSIS — Z8249 Family history of ischemic heart disease and other diseases of the circulatory system: Secondary | ICD-10-CM | POA: Diagnosis not present

## 2017-08-30 DIAGNOSIS — E1151 Type 2 diabetes mellitus with diabetic peripheral angiopathy without gangrene: Secondary | ICD-10-CM | POA: Insufficient documentation

## 2017-08-30 DIAGNOSIS — I252 Old myocardial infarction: Secondary | ICD-10-CM | POA: Insufficient documentation

## 2017-08-30 DIAGNOSIS — Z9861 Coronary angioplasty status: Secondary | ICD-10-CM | POA: Diagnosis not present

## 2017-08-30 DIAGNOSIS — J45909 Unspecified asthma, uncomplicated: Secondary | ICD-10-CM | POA: Insufficient documentation

## 2017-08-30 DIAGNOSIS — Z801 Family history of malignant neoplasm of trachea, bronchus and lung: Secondary | ICD-10-CM | POA: Diagnosis not present

## 2017-08-30 DIAGNOSIS — E7800 Pure hypercholesterolemia, unspecified: Secondary | ICD-10-CM | POA: Diagnosis not present

## 2017-08-30 DIAGNOSIS — E1122 Type 2 diabetes mellitus with diabetic chronic kidney disease: Secondary | ICD-10-CM | POA: Insufficient documentation

## 2017-08-30 DIAGNOSIS — Z7982 Long term (current) use of aspirin: Secondary | ICD-10-CM | POA: Insufficient documentation

## 2017-08-30 DIAGNOSIS — E785 Hyperlipidemia, unspecified: Secondary | ICD-10-CM | POA: Diagnosis not present

## 2017-08-30 DIAGNOSIS — I2510 Atherosclerotic heart disease of native coronary artery without angina pectoris: Secondary | ICD-10-CM | POA: Diagnosis not present

## 2017-08-30 DIAGNOSIS — I5032 Chronic diastolic (congestive) heart failure: Secondary | ICD-10-CM

## 2017-08-30 DIAGNOSIS — F329 Major depressive disorder, single episode, unspecified: Secondary | ICD-10-CM | POA: Diagnosis not present

## 2017-08-30 DIAGNOSIS — N186 End stage renal disease: Secondary | ICD-10-CM | POA: Insufficient documentation

## 2017-08-30 DIAGNOSIS — Z992 Dependence on renal dialysis: Secondary | ICD-10-CM | POA: Diagnosis not present

## 2017-08-30 DIAGNOSIS — I132 Hypertensive heart and chronic kidney disease with heart failure and with stage 5 chronic kidney disease, or end stage renal disease: Secondary | ICD-10-CM | POA: Diagnosis not present

## 2017-08-30 DIAGNOSIS — I10 Essential (primary) hypertension: Secondary | ICD-10-CM

## 2017-08-30 MED ORDER — CARVEDILOL 25 MG PO TABS: 25.0000 mg | ORAL_TABLET | Freq: Two times a day (BID) | ORAL | 3 refills | Status: DC

## 2017-08-30 NOTE — Patient Instructions (Signed)
Continue weighing daily and call for an overnight weight gain of > 2 pounds or a weekly weight gain of >5 pounds. 

## 2017-08-30 NOTE — Progress Notes (Signed)
Patient ID: Scott [name], male    DOB: May 22, 1961, 56 y.o.   MRN: [id number]  HPI  [name] is a 56 yoM with PMH of HTN, ESRD on dialysis M/W/F, T2DM, HLD, CAD, NSTEMI s/p stents 4/17, congestive heart failure with preserved ejection fraction.  Echo on 11/13/16 showed EF of 55-60% similar to echo on 01/09/15 showing EF of 55-65%  Was in the ED 07/10/17 due to abdominal pain. Abdominal/pelvic CT was non-acute. Released home with antibiotic and follow-up with nephrology. Was in the ED 3 times from 04/08/17-04/17/17 due to urinary/catheter issues. Each time he was treated and released.   Presents today for a follow-up visit with a chief complaint of minimal shortness of breath upon moderate exertion. He describes this as chronic in nature having been present for several years with varying levels of severity. He has associated fatigue along with this. He denies any chest pain, edema, palpitations, dizziness, difficulty sleeping or weight gain. Continues to do home dialysis 4 times daily.   Past Medical History:  Diagnosis Date  . Arthritis    "left arm; right leg" (12/13/2014)  . Asthma   . CHF (congestive heart failure) ([place])   . Chronic disease anemia    [name] 12/13/2014  . Chronic kidney disease (CKD), stage IV (severe) ([place])    [name] 12/13/2014... on dialysis  . Coronary artery disease    [name] 12/13/2014  . Depression   . Dysrhythmia    patient unaware of irregular heartbeat  . GERD (gastroesophageal reflux disease)   . High cholesterol    [name] 12/13/2014  . Hypertension   . Non-Q wave myocardial infarction ([place])    [name] 12/13/2014  . PVD (peripheral vascular disease) ([place])    [name] 12/13/2014  . Type II diabetes mellitus ([place])    Past Surgical History:  Procedure Laterality Date  . AV FISTULA PLACEMENT Right 04/07/2017   Procedure: ARTERIOVENOUS (AV) FISTULA CREATION ( BRACHIALCEPHALIC );  Surgeon: Katha Cabal, MD;  Location: [facility] ORS;  Service: Vascular;  Laterality:  Right;  . CAPD INSERTION N/A 04/07/2017   Procedure: LAPAROSCOPIC INSERTION CONTINUOUS AMBULATORY PERITONEAL DIALYSIS  (CAPD) CATHETER;  Surgeon: Katha Cabal, MD;  Location: [facility] ORS;  Service: Vascular;  Laterality: N/A;  . CARDIAC CATHETERIZATION  11/2014  . DIALYSIS/PERMA CATHETER INSERTION N/A 01/18/2017   Procedure: Dialysis/Perma Catheter Insertion;  Surgeon: Algernon Huxley, MD;  Location: [place] [facility];  Service: Cardiovascular;  Laterality: N/A;  . DIALYSIS/PERMA CATHETER REMOVAL N/A 07/26/2017   Procedure: DIALYSIS/PERMA CATHETER REMOVAL;  Surgeon: Algernon Huxley, MD;  Location: [place] [facility];  Service: Cardiovascular;  Laterality: N/A;  . INCISION AND DRAINAGE OF WOUND Right ~ 10/2014   "4th toe foot"  . PERCUTANEOUS CORONARY STENT INTERVENTION (PCI-S) N/A 12/14/2014   Procedure: PERCUTANEOUS CORONARY STENT INTERVENTION (PCI-S);  Surgeon: Charolette Forward, MD;  Location: [facility] CATH LAB;  Service: Cardiovascular;  Laterality: N/A;  . PERCUTANEOUS CORONARY STENT INTERVENTION (PCI-S) N/A 12/18/2014   Procedure: PERCUTANEOUS CORONARY STENT INTERVENTION (PCI-S);  Surgeon: Charolette Forward, MD;  Location: [facility] CATH LAB;  Service: Cardiovascular;  Laterality: N/A;  . TOE AMPUTATION Right 2015   4th toe   Family History  Problem Relation Age of Onset  . Cancer Mother        Lung  . Cancer Father        Lung  . Diabetes Sister   . Diabetes Brother   . CAD Brother   . Hernia Son   .  Cancer Brother    Social History   Tobacco Use  . Smoking status: Former Smoker    Packs/day: 0.00    Years: 30.00    Pack years: 0.00    Types: Cigarettes    Start date: 08/31/1984    Last attempt to quit: 11/30/2014    Years since quitting: 2.7  . Smokeless tobacco: Never Used  Substance Use Topics  . Alcohol use: Yes    Comment: Rarely, twice a year.   No Known Allergies  Prior to Admission medications   Medication Sig Start Date End Date Taking? Authorizing Provider  albuterol (PROVENTIL  HFA;VENTOLIN HFA) 108 (90 BASE) MCG/ACT inhaler Inhale 2 puffs into the lungs every 6 (six) hours as needed for wheezing or shortness of breath. 01/10/15  Yes Gladstone Lighter, MD  aspirin EC 81 MG tablet Take 1 tablet (81 mg total) by mouth daily. 01/10/15  Yes Gladstone Lighter, MD  atorvastatin (LIPITOR) 80 MG tablet Take 1 tablet (80 mg total) by mouth daily at 6 PM. 01/10/15  Yes Gladstone Lighter, MD  calcium acetate (PHOSLO) 667 MG capsule Take 2,001 mg by mouth 3 (three) times daily with meals.  06/08/17  Yes [provider]  carvedilol (COREG) 25 MG tablet Take 1 tablet (25 mg total) by mouth 2 (two) times daily with a meal. 08/30/17  Yes Darylene Price A, FNP  Cholecalciferol (VITAMIN D) 2000 units tablet Take 2,000 Units by mouth daily.   Yes [provider]  furosemide (LASIX) 40 MG tablet Take 40 mg by mouth.   Yes [provider]  hydrALAZINE (APRESOLINE) 25 MG tablet Take 1 tablet (25 mg total) by mouth 3 (three) times daily. 08/05/17 11/03/17 Yes Hackney, Otila Kluver A, FNP  insulin aspart protamine- aspart (NOVOLOG MIX 70/30) (70-30) 100 UNIT/ML injection Inject 0.35 mLs (35 Units total) into the skin 2 (two) times daily with a meal. Patient taking differently: Inject 40 Units into the skin 2 (two) times daily with a meal.  01/10/15  Yes Gladstone Lighter, MD  lisinopril (PRINIVIL,ZESTRIL) 20 MG tablet Take 20 mg by mouth daily. 02/02/17  Yes [provider]  nitroGLYCERIN (NITROSTAT) 0.4 MG SL tablet Place 1 tablet (0.4 mg total) under the tongue every 5 (five) minutes x 3 doses as needed for chest pain. 12/19/14  Yes Charolette Forward, MD  omeprazole (PRILOSEC) 20 MG capsule Take 1 capsule (20 mg total) by mouth daily. 01/10/15  Yes Gladstone Lighter, MD  pioglitazone (ACTOS) 15 MG tablet Take 15 mg by mouth daily.   Yes [provider]  prasugrel (EFFIENT) 10 MG TABS tablet Take 1 tablet (10 mg total) by mouth daily. 01/10/15  Yes Gladstone Lighter, MD   senna-docusate (SENOKOT-S) 8.6-50 MG tablet Take 2 tablets daily by mouth. 07/11/17 07/11/18 Yes Hinda Kehr, MD   Review of Systems  Constitutional: Positive for fatigue. Negative for appetite change.  HENT: Negative for congestion, postnasal drip and sore throat.   Respiratory: Positive for shortness of breath (improving). Negative for cough and chest tightness.   Cardiovascular: Negative for chest pain, palpitations and leg swelling.  Gastrointestinal: Negative for abdominal distention and abdominal pain.  Endocrine: Negative.   Genitourinary: Negative.   Musculoskeletal: Negative for back pain and neck pain.  Skin: Negative.   Allergic/Immunologic: Negative.   Neurological: Negative for dizziness, light-headedness and headaches.  Hematological: Negative for adenopathy. Does not bruise/bleed easily.  Psychiatric/Behavioral: Negative for dysphoric mood and sleep disturbance. The patient is not nervous/anxious.    Vitals:  08/30/17 1123  BP: 140/63  Pulse: 74  Resp: 18  SpO2: 100%  Weight: 243 lb 6 oz (110.4 kg)  Height: 5\' 9"  (1.753 m)   Wt Readings from Last 3 Encounters:  08/30/17 243 lb 6 oz (110.4 kg)  08/05/17 249 lb 4 oz (113.1 kg)  07/26/17 240 lb (108.9 kg)    Lab Results  Component Value Date   CREATININE 7.67 (H) 07/10/2017   CREATININE 4.94 (H) 03/25/2017   CREATININE 3.01 (H) 02/10/2017    Physical Exam  Constitutional: He is oriented to person, place, and time. He appears well-developed and well-nourished.  HENT:  Head: Normocephalic and atraumatic.  Neck: Normal range of motion. Neck supple. No JVD present.  Cardiovascular: Normal rate and regular rhythm.  Pulmonary/Chest: Effort normal. He has no wheezes. He has no rales.  Abdominal: Soft. He exhibits no distension. There is no tenderness.  Musculoskeletal: He exhibits no edema or tenderness.  Neurological: He is alert and oriented to person, place, and time.  Skin: Skin is warm and dry.   Psychiatric: He has a normal mood and affect. His behavior is normal. Thought content normal.  Nursing note and vitals reviewed.   Assessment & Plan:  1. Chronic heart failure with preserved ejection fraction- - NYHA class II - euvolemic - Not adding salt to foods. Reminded to follow < 2000 mg sodium diet.  - weighing daily and weight has declined. reminded him to call for overnight weight gain of >2 pounds or weekly weight gain of >5 pounds - weight down 6 pounds since he was last here - saw cardiologist (Cyl[place]) 10/21/16 - patient reports receiving his flu vaccine for the season already - has been started on furosemide  2. Diabetes- - checking 3x daily; fasting this morning at home was 214 - 11/13/16 A1c 7.0% - continue 40 units BID of Novolog 70/30; he says that he'll adjust it depending on how much he eats and what his readings are  3. HTN - BP much improved since hydralazine and furosemide was added - says his BP at home has been running [id number] systolic - BMP on 24/58/09 reviewed and shows sodium 139, potassium 4.8 and GFR 7  4. ESRD- - does home dialysis four times daily and for ~ 15 minutes each time - has had labs done at nephrologist  Medication bottles were reviewed.   Return in 6 months or sooner for any questions/problems before then.

## 2017-09-02 ENCOUNTER — Other Ambulatory Visit: Payer: Self-pay | Admitting: Nephrology

## 2017-10-27 ENCOUNTER — Other Ambulatory Visit: Payer: Self-pay | Admitting: Obstetrics and Gynecology

## 2017-10-27 DIAGNOSIS — E13621 Other specified diabetes mellitus with foot ulcer: Secondary | ICD-10-CM

## 2017-10-27 DIAGNOSIS — E138 Other specified diabetes mellitus with unspecified complications: Secondary | ICD-10-CM

## 2017-10-27 DIAGNOSIS — L97509 Non-pressure chronic ulcer of other part of unspecified foot with unspecified severity: Principal | ICD-10-CM

## 2017-10-29 ENCOUNTER — Ambulatory Visit
Admission: RE | Admit: 2017-10-29 | Discharge: 2017-10-29 | Disposition: A | Discharge status: Home / Self Care | Payer: Medicare Other | Source: Ambulatory Visit | Attending: Obstetrics and Gynecology | Admitting: Obstetrics and Gynecology

## 2017-10-29 DIAGNOSIS — I779 Disorder of arteries and arterioles, unspecified: Secondary | ICD-10-CM | POA: Insufficient documentation

## 2017-10-29 DIAGNOSIS — E13621 Other specified diabetes mellitus with foot ulcer: Secondary | ICD-10-CM

## 2017-10-29 DIAGNOSIS — L97501 Non-pressure chronic ulcer of other part of unspecified foot limited to breakdown of skin: Secondary | ICD-10-CM | POA: Insufficient documentation

## 2017-10-29 DIAGNOSIS — L97509 Non-pressure chronic ulcer of other part of unspecified foot with unspecified severity: Secondary | ICD-10-CM

## 2017-10-29 DIAGNOSIS — E138 Other specified diabetes mellitus with unspecified complications: Secondary | ICD-10-CM

## 2017-10-29 DIAGNOSIS — E11621 Type 2 diabetes mellitus with foot ulcer: Secondary | ICD-10-CM | POA: Diagnosis not present

## 2017-11-16 IMAGING — CR DG CHEST 2V
2 series · 2 of 2 positions shown · non-contrast
Comparison: 01/17/2017

CLINICAL DATA: Hypotension

EXAM:
CHEST  2 VIEW

[chest pa]
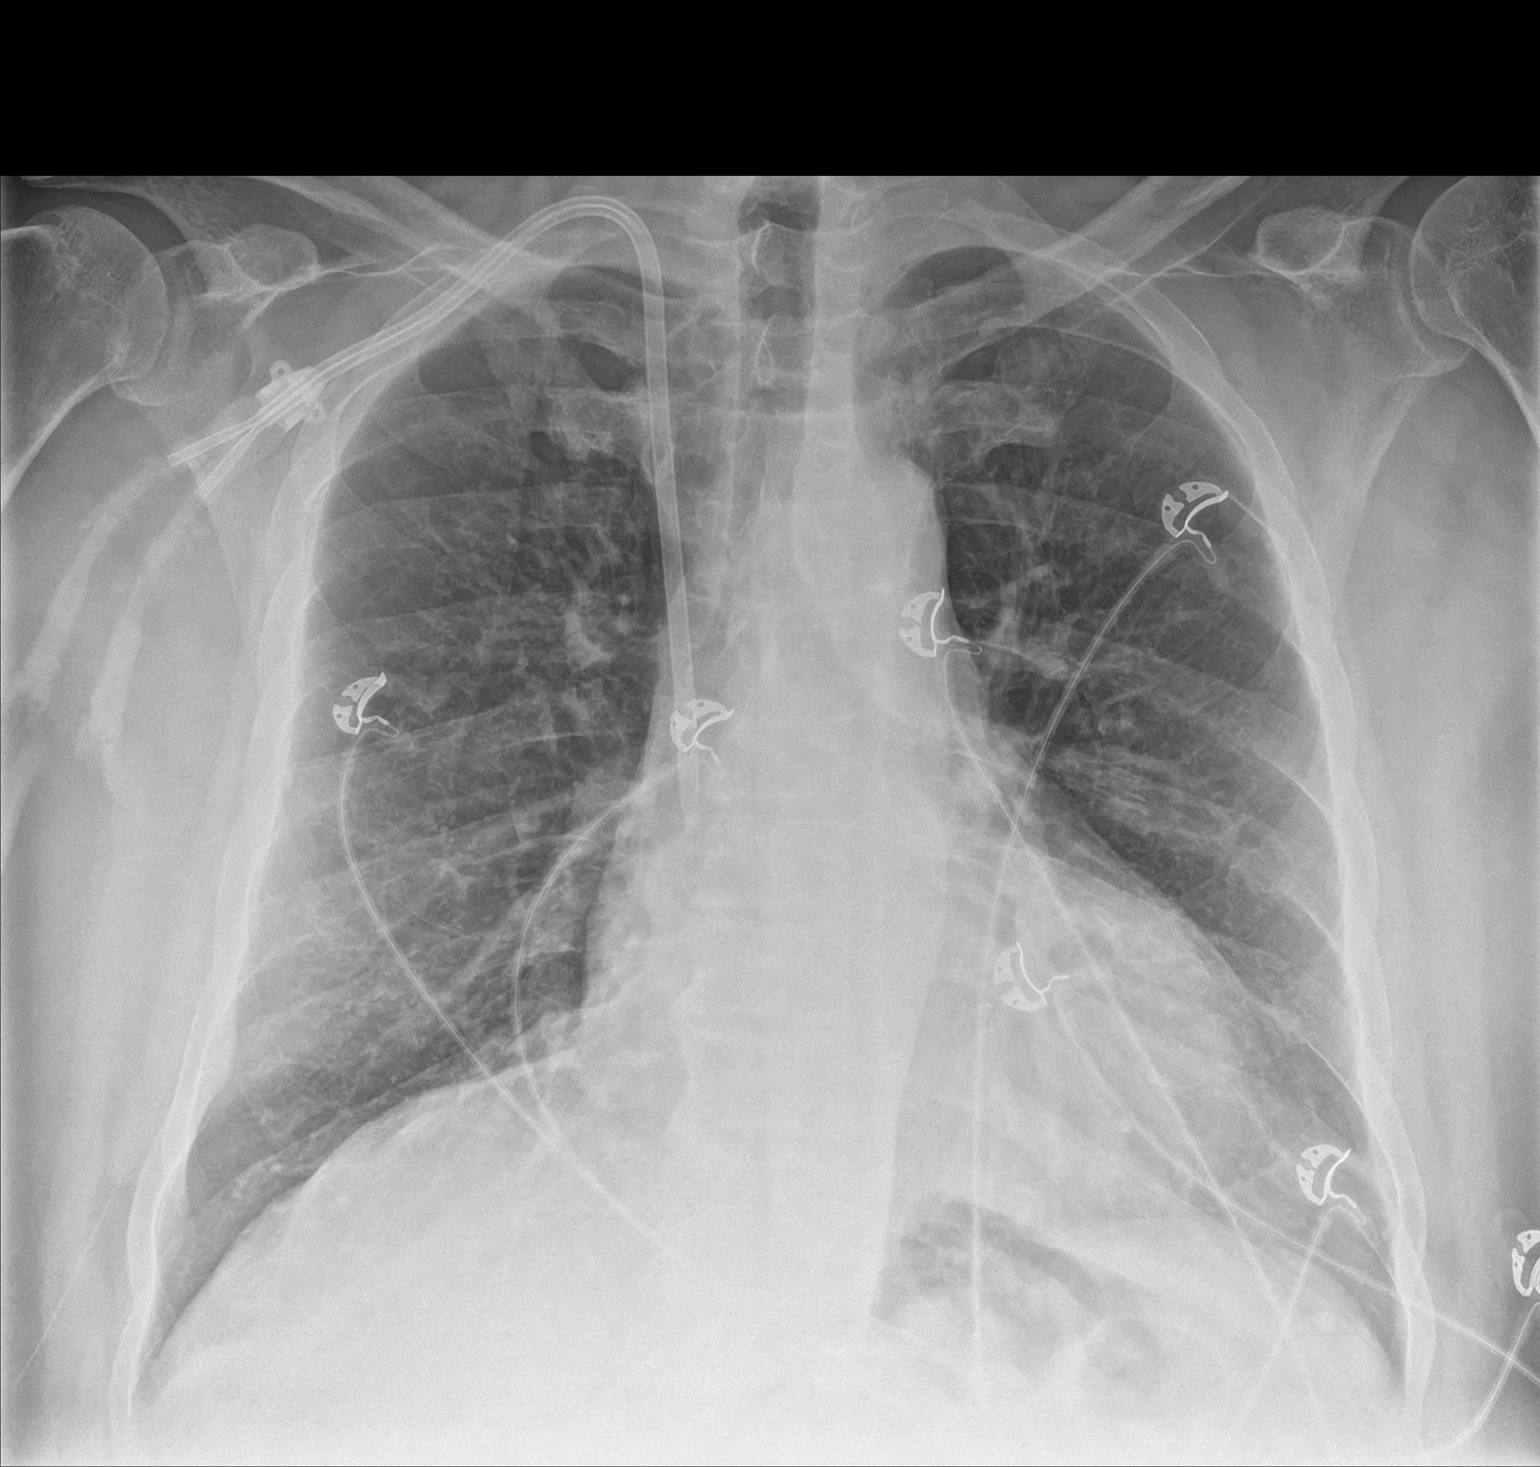

[chest lat]
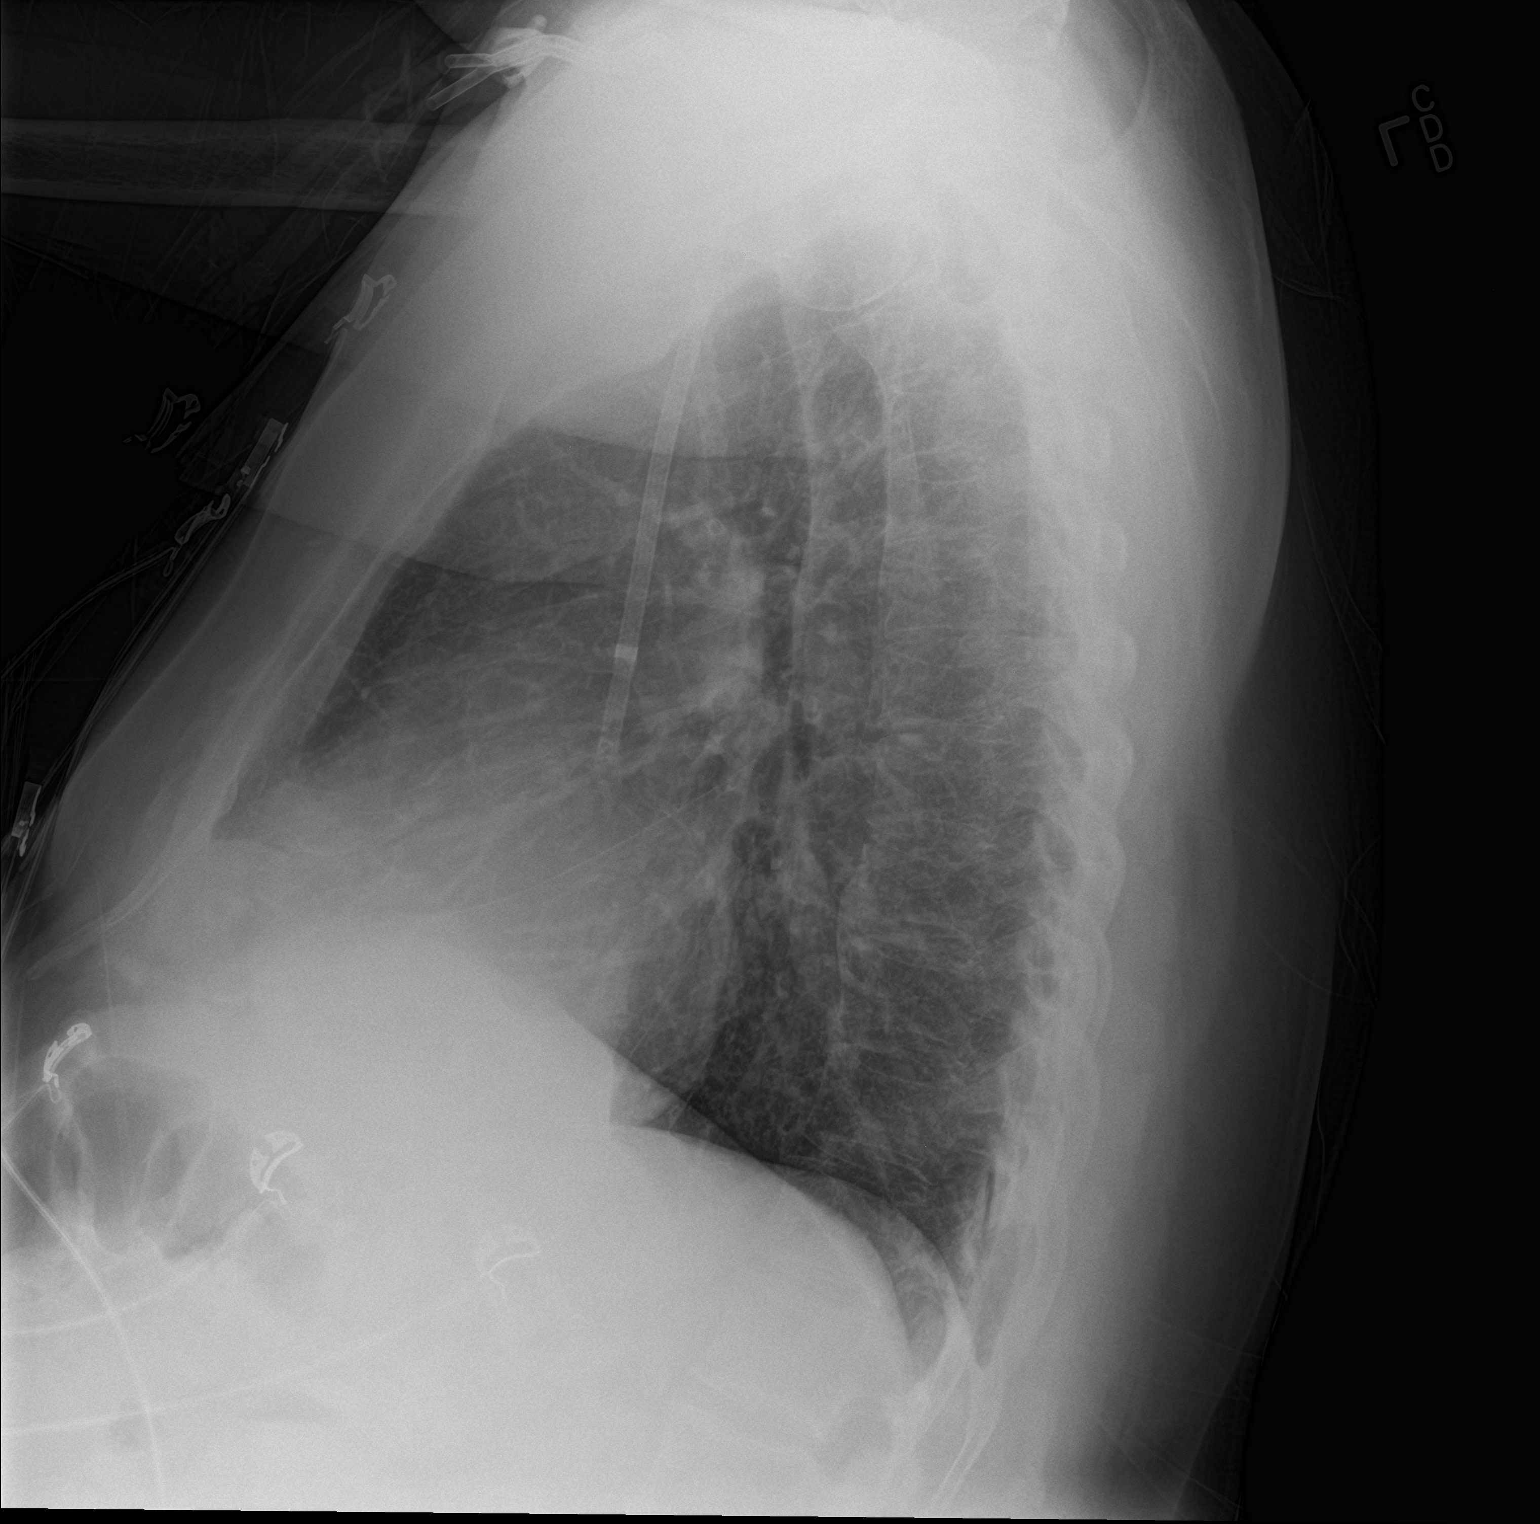

[2 of 2 positions shown; findings below may reference images not displayed]

FINDINGS: Right-sided central venous catheter tip overlies the distal SVC.
Cardiomegaly with aortic atherosclerosis. No focal consolidation or
pleural effusion. No pneumothorax.
IMPRESSION: Cardiomegaly without edema or infiltrate.

## 2017-11-19 ENCOUNTER — Other Ambulatory Visit: Payer: Self-pay

## 2017-11-29 ENCOUNTER — Telehealth: Payer: Self-pay | Admitting: Gastroenterology

## 2017-11-29 NOTE — Telephone Encounter (Signed)
Patient called and is ready to schedule his procedure. 

## 2017-11-30 ENCOUNTER — Other Ambulatory Visit: Payer: Self-pay

## 2017-11-30 ENCOUNTER — Encounter: Payer: Self-pay | Admitting: *Deleted

## 2017-11-30 DIAGNOSIS — R195 Other fecal abnormalities: Secondary | ICD-10-CM

## 2017-11-30 NOTE — Telephone Encounter (Signed)
Home phone: LVM for patient callback per phone message to schedule colonoscopy.   Mobile phone: LVM for patient callback for the same.

## 2017-12-01 ENCOUNTER — Ambulatory Visit
Admission: RE | Admit: 2017-12-01 | Discharge: 2017-12-01 | Disposition: A | Discharge status: Home / Self Care | Payer: Medicare Other | Source: Ambulatory Visit | Attending: Gastroenterology | Admitting: Gastroenterology

## 2017-12-01 ENCOUNTER — Encounter
Admission: RE | Disposition: A | Discharge status: Home / Self Care | Payer: Self-pay | Source: Ambulatory Visit | Attending: Gastroenterology

## 2017-12-01 ENCOUNTER — Ambulatory Visit: Payer: Medicare Other | Admitting: Anesthesiology

## 2017-12-01 DIAGNOSIS — K589 Irritable bowel syndrome without diarrhea: Secondary | ICD-10-CM | POA: Insufficient documentation

## 2017-12-01 DIAGNOSIS — K219 Gastro-esophageal reflux disease without esophagitis: Secondary | ICD-10-CM | POA: Insufficient documentation

## 2017-12-01 DIAGNOSIS — F329 Major depressive disorder, single episode, unspecified: Secondary | ICD-10-CM | POA: Insufficient documentation

## 2017-12-01 DIAGNOSIS — Z1211 Encounter for screening for malignant neoplasm of colon: Secondary | ICD-10-CM | POA: Diagnosis present

## 2017-12-01 DIAGNOSIS — Z992 Dependence on renal dialysis: Secondary | ICD-10-CM | POA: Diagnosis not present

## 2017-12-01 DIAGNOSIS — R195 Other fecal abnormalities: Secondary | ICD-10-CM

## 2017-12-01 DIAGNOSIS — E1122 Type 2 diabetes mellitus with diabetic chronic kidney disease: Secondary | ICD-10-CM | POA: Diagnosis not present

## 2017-12-01 DIAGNOSIS — D125 Benign neoplasm of sigmoid colon: Secondary | ICD-10-CM | POA: Diagnosis not present

## 2017-12-01 DIAGNOSIS — J45909 Unspecified asthma, uncomplicated: Secondary | ICD-10-CM | POA: Insufficient documentation

## 2017-12-01 DIAGNOSIS — Z8249 Family history of ischemic heart disease and other diseases of the circulatory system: Secondary | ICD-10-CM | POA: Diagnosis not present

## 2017-12-01 DIAGNOSIS — Z955 Presence of coronary angioplasty implant and graft: Secondary | ICD-10-CM | POA: Insufficient documentation

## 2017-12-01 DIAGNOSIS — Z800 Family history of malignant neoplasm of digestive organs: Secondary | ICD-10-CM | POA: Diagnosis not present

## 2017-12-01 DIAGNOSIS — I509 Heart failure, unspecified: Secondary | ICD-10-CM | POA: Insufficient documentation

## 2017-12-01 DIAGNOSIS — I252 Old myocardial infarction: Secondary | ICD-10-CM | POA: Diagnosis not present

## 2017-12-01 DIAGNOSIS — M1990 Unspecified osteoarthritis, unspecified site: Secondary | ICD-10-CM | POA: Diagnosis not present

## 2017-12-01 DIAGNOSIS — N186 End stage renal disease: Secondary | ICD-10-CM | POA: Diagnosis not present

## 2017-12-01 DIAGNOSIS — K644 Residual hemorrhoidal skin tags: Secondary | ICD-10-CM | POA: Insufficient documentation

## 2017-12-01 DIAGNOSIS — K648 Other hemorrhoids: Secondary | ICD-10-CM | POA: Diagnosis not present

## 2017-12-01 DIAGNOSIS — D122 Benign neoplasm of ascending colon: Secondary | ICD-10-CM | POA: Diagnosis not present

## 2017-12-01 DIAGNOSIS — I2510 Atherosclerotic heart disease of native coronary artery without angina pectoris: Secondary | ICD-10-CM | POA: Insufficient documentation

## 2017-12-01 DIAGNOSIS — I132 Hypertensive heart and chronic kidney disease with heart failure and with stage 5 chronic kidney disease, or end stage renal disease: Secondary | ICD-10-CM | POA: Insufficient documentation

## 2017-12-01 DIAGNOSIS — E7800 Pure hypercholesterolemia, unspecified: Secondary | ICD-10-CM | POA: Insufficient documentation

## 2017-12-01 DIAGNOSIS — Z79899 Other long term (current) drug therapy: Secondary | ICD-10-CM | POA: Diagnosis not present

## 2017-12-01 DIAGNOSIS — Z7982 Long term (current) use of aspirin: Secondary | ICD-10-CM | POA: Insufficient documentation

## 2017-12-01 DIAGNOSIS — D124 Benign neoplasm of descending colon: Secondary | ICD-10-CM | POA: Insufficient documentation

## 2017-12-01 DIAGNOSIS — Z87891 Personal history of nicotine dependence: Secondary | ICD-10-CM | POA: Diagnosis not present

## 2017-12-01 DIAGNOSIS — E1151 Type 2 diabetes mellitus with diabetic peripheral angiopathy without gangrene: Secondary | ICD-10-CM | POA: Insufficient documentation

## 2017-12-01 HISTORY — PX: POLYPECTOMY: SHX5525

## 2017-12-01 HISTORY — DX: Adverse effect of unspecified anesthetic, initial encounter: T41.45XA

## 2017-12-01 HISTORY — DX: Dependence on renal dialysis: Z99.2

## 2017-12-01 HISTORY — DX: Other complications of anesthesia, initial encounter: T88.59XA

## 2017-12-01 HISTORY — PX: COLONOSCOPY WITH PROPOFOL: SHX5780

## 2017-12-01 LAB — GLUCOSE, CAPILLARY
GLUCOSE-CAPILLARY: 107 mg/dL — AB (ref 65–99)
GLUCOSE-CAPILLARY: 193 mg/dL — AB (ref 65–99)

## 2017-12-01 SURGERY — COLONOSCOPY WITH PROPOFOL
Anesthesia: General | Wound class: Contaminated

## 2017-12-01 MED ORDER — SODIUM CHLORIDE 0.9 % IV SOLN: INTRAVENOUS | Status: DC

## 2017-12-01 MED ORDER — GLYCOPYRROLATE 0.2 MG/ML IJ SOLN
INTRAMUSCULAR | Status: DC | PRN
  Administered 2017-12-01: 0.1 mg via INTRAVENOUS

## 2017-12-01 MED ORDER — PROPOFOL 10 MG/ML IV BOLUS
INTRAVENOUS | Status: DC | PRN
  Administered 2017-12-01 (×5): 20 mg via INTRAVENOUS
  Administered 2017-12-01: 10 mg via INTRAVENOUS
  Administered 2017-12-01 (×3): 20 mg via INTRAVENOUS
  Administered 2017-12-01: 10 mg via INTRAVENOUS
  Administered 2017-12-01 (×8): 20 mg via INTRAVENOUS
  Administered 2017-12-01: 10 mg via INTRAVENOUS
  Administered 2017-12-01 (×5): 20 mg via INTRAVENOUS
  Administered 2017-12-01: 60 mg via INTRAVENOUS
  Administered 2017-12-01 (×8): 20 mg via INTRAVENOUS

## 2017-12-01 MED ORDER — LIDOCAINE HCL (CARDIAC) 20 MG/ML IV SOLN
INTRAVENOUS | Status: DC | PRN
  Administered 2017-12-01: 30 mg via INTRAVENOUS

## 2017-12-01 MED ORDER — OXYCODONE HCL 5 MG/5ML PO SOLN: 5.0000 mg | Freq: Once | ORAL | Status: DC | PRN

## 2017-12-01 MED ORDER — STERILE WATER FOR IRRIGATION IR SOLN
Status: DC | PRN
  Administered 2017-12-01: 08:00:00

## 2017-12-01 MED ORDER — OXYCODONE HCL 5 MG PO TABS: 5.0000 mg | ORAL_TABLET | Freq: Once | ORAL | Status: DC | PRN

## 2017-12-01 MED ORDER — LACTATED RINGERS IV SOLN
INTRAVENOUS | Status: DC
  Administered 2017-12-01 (×3): via INTRAVENOUS

## 2017-12-01 MED ORDER — GLUCAGON HCL RDNA (DIAGNOSTIC) 1 MG IJ SOLR
INTRAMUSCULAR | Status: DC | PRN
  Administered 2017-12-01 (×2): 1 mg via INTRAVENOUS

## 2017-12-01 SURGICAL SUPPLY — 25 items
CANISTER SUCT 1200ML W/VALVE (MISCELLANEOUS) ×4 IMPLANT
CLIP HMST 235XBRD CATH ROT (MISCELLANEOUS) ×8 IMPLANT
CLIP RESOLUTION 360 11X235 (MISCELLANEOUS) ×8
ELECT REM PT RETURN 9FT ADLT (ELECTROSURGICAL) ×4
ELECTRODE REM PT RTRN 9FT ADLT (ELECTROSURGICAL) ×2 IMPLANT
FCP ESCP3.2XJMB 240X2.8X (MISCELLANEOUS)
FORCEPS BIOP RAD 4 LRG CAP 4 (CUTTING FORCEPS) IMPLANT
FORCEPS BIOP RJ4 240 W/NDL (MISCELLANEOUS)
FORCEPS ESCP3.2XJMB 240X2.8X (MISCELLANEOUS) IMPLANT
GOWN CVR UNV OPN BCK APRN NK (MISCELLANEOUS) ×4 IMPLANT
GOWN ISOL THUMB LOOP REG UNIV (MISCELLANEOUS) ×4
INJECTOR VARIJECT VIN23 (MISCELLANEOUS) ×2 IMPLANT
KIT DEFENDO VALVE AND CONN (KITS) IMPLANT
KIT ENDO PROCEDURE OLY (KITS) ×4 IMPLANT
MARKER SPOT ENDO TATTOO 5ML (MISCELLANEOUS) ×2 IMPLANT
PROBE APC STR FIRE (PROBE) IMPLANT
RETRIEVER NET ROTH 2.5X230 LF (MISCELLANEOUS) IMPLANT
SNARE COLD EXACTO (MISCELLANEOUS) IMPLANT
SNARE SHORT THROW 13M SML OVAL (MISCELLANEOUS) ×4 IMPLANT
SNARE SHORT THROW 30M LRG OVAL (MISCELLANEOUS) ×4 IMPLANT
SNARE SNG USE RND 15MM (INSTRUMENTS) IMPLANT
SPOT EX ENDOSCOPIC TATTOO (MISCELLANEOUS) ×2
TRAP ETRAP POLY (MISCELLANEOUS) ×4 IMPLANT
VARIJECT INJECTOR VIN23 (MISCELLANEOUS) ×4
WATER STERILE IRR 250ML POUR (IV SOLUTION) ×4 IMPLANT

## 2017-12-01 NOTE — Anesthesia Preprocedure Evaluation (Addendum)
Anesthesia Evaluation  Patient identified by MRN, date of birth, ID band  Reviewed: NPO status   History of Anesthesia Complications Negative for: history of anesthetic complications  Airway Mallampati: II  TM Distance: >3 FB Neck ROM: full    Dental no notable dental hx. (+) Edentulous Lower, Edentulous Upper   Pulmonary asthma , former smoker,    Pulmonary exam normal        Cardiovascular Exercise Tolerance: Good hypertension, + CAD (stent x 3 2017), + Past MI (2017), + Peripheral Vascular Disease and +CHF (hx of)  Normal cardiovascular exam   ekg: 01/2017: Sinus rhythm Nonspecific intraventricular conduction delay Nonspecific repol abnormality, lateral leads;  echo: 10/2016: Left ventricle: The cavity size was normal. Systolic function was normal. The estimated ejection fraction was in the range of 55% to 60%. Wall motion was normal; there were no regional wall motion abnormalities. Left ventricular diastolic function parameters were normal. - Left atrium: The atrium was mildly dilated. - Right ventricle: Systolic function was normal. - Pulmonary arteries: Systolic pressure was within the normal   cards note: 10/2016: dr. Saralyn Pilar: stable;      Neuro/Psych PSYCHIATRIC DISORDERS Depression negative neurological ROS     GI/Hepatic negative GI ROS, Neg liver ROS, GERD  Controlled,  Endo/Other  diabetes  Renal/GU ESRF and DialysisRenal disease (on PDialysis; last 4/3)  negative genitourinary   Musculoskeletal  (+) Arthritis ,   Abdominal   Peds  Hematology  (+) anemia ,   Anesthesia Other Findings Fell in shower 4/2 > no LOC;  Last effient 5 days ago.  Reproductive/Obstetrics                            Anesthesia Physical Anesthesia Plan  ASA: III  Anesthesia Plan: General   Post-op Pain Management:    Induction:   PONV Risk Score and Plan:   Airway Management  Planned: Natural Airway  Additional Equipment:   Intra-op Plan:   Post-operative Plan:   Informed Consent: I have reviewed the patients History and Physical, chart, labs and discussed the procedure including the risks, benefits and alternatives for the proposed anesthesia with the patient or authorized representative who has indicated his/her understanding and acceptance.     Plan Discussed with: CRNA  Anesthesia Plan Comments:         Anesthesia Quick Evaluation

## 2017-12-01 NOTE — Op Note (Signed)
[facility] Gastroenterology Patient Name: Scott [name] Procedure Date: 12/01/2017 7:50 AM MRN: [id number] Account #: [id number] Date of Birth: 11-16-1960 Admit Type: Outpatient Age: 57 Room: [facility] OR ROOM 01 Gender: Male Note Status: Finalized Procedure:            Colonoscopy Indications:          Screening for colorectal malignant neoplasm, Screening                        in patient at increased risk: Colorectal cancer in                        brother 79 or older, This is the patient's first                        colonoscopy Providers:            Lin Landsman MD, MD Medicines:            Monitored Anesthesia Care Complications:        No immediate complications. Estimated blood loss:                        Minimal. Procedure:            Pre-Anesthesia Assessment:                       - Prior to the procedure, a History and Physical was                        performed, and patient medications and allergies were                        reviewed. The patient is competent. The risks and                        benefits of the procedure and the sedation options and                        risks were discussed with the patient. All questions                        were answered and informed consent was obtained.                        Patient identification and proposed procedure were                        verified by the physician, the nurse, the                        anesthesiologist, the anesthetist and the technician in                        the pre-procedure area in the procedure room. Mental                        Status Examination: alert and oriented. Airway                        Examination: normal oropharyngeal airway  and neck                        mobility. Respiratory Examination: clear to                        auscultation. CV Examination: normal. Prophylactic                        Antibiotics: The patient does not require prophylactic                      antibiotics. Prior Anticoagulants: The patient has                        taken Effient (prasugrel), last dose was 5 days prior                        to procedure. ASA Grade Assessment: III - A patient                        with severe systemic disease. After reviewing the risks                        and benefits, the patient was deemed in satisfactory                        condition to undergo the procedure. The anesthesia plan                        was to use monitored anesthesia care (MAC). Immediately                        prior to administration of medications, the patient was                        re-assessed for adequacy to receive sedatives. The                        heart rate, respiratory rate, oxygen saturations, blood                        pressure, adequacy of pulmonary ventilation, and                        response to care were monitored throughout the                        procedure. The physical status of the patient was                        re-assessed after the procedure.                       After obtaining informed consent, the colonoscope was                        passed under direct vision. Throughout the procedure,                        the patient's blood pressure, pulse, and oxygen  saturations were monitored continuously. The [place] [id number]) was introduced through the                        anus and advanced to the the cecum, identified by                        appendiceal orifice and ileocecal valve. The                        colonoscopy was extremely difficult due to inadequate                        bowel prep and signifcant colonic spasm. Successful                        completion of the procedure was aided by glucon, but                        not successful. The patient tolerated the procedure                        well. The quality of the bowel  preparation was                        evaluated using the BBPS [facility] Bowel Preparation                        Scale) with scores of: Right Colon = 2 (minor amount of                        residual staining, small fragments of stool and/or                        opaque liquid, but mucosa seen well), Transverse Colon                        = 2 (minor amount of residual staining, small fragments                        of stool and/or opaque liquid, but mucosa seen well)                        and Left Colon = 2 (minor amount of residual staining,                        small fragments of stool and/or opaque liquid, but                        mucosa seen well). The total BBPS score equals 6. The                        quality of the bowel preparation was fair. Findings:      The perianal and digital rectal examinations were normal. Pertinent       negatives include normal sphincter tone and no palpable rectal lesions.  A 20 mm polyp was found in the ascending colon. The polyp was sessile.       The polyp was removed with a hot snare. Resection and retrieval were       complete. To prevent bleeding after the polypectomy, two hemostatic       clips were successfully placed (MR conditional). There was no bleeding       at the end of the procedure. Area was tattooed with an injection of       [place] ink.      Six multi-lobulated and sessile and semi-pedunculated polyps were found       in the sigmoid colon and descending colon. The polyps were 5 to 30 mm in       size. These polyps were removed with a hot snare. Resection and       retrieval were complete. To prevent bleeding after the polypectomy,       three hemostatic clips were successfully placed (MR conditional). There       was no bleeding at the end of the procedure.      Internal external and internal hemorrhoids were found during       retroflexion. The hemorrhoids were large. Impression:           - Preparation of the colon was  fair. Extemely prolonged                        duration of procedure due to signifcant colon spasm,                        limitng removal of large colon polyps efficienctly.                        Piecemeal resection was performed. Small polyps could                        be missed                       - One 20 mm polyp in the ascending colon, removed with                        a hot snare. Resected and retrieved. Clips (MR                        conditional) were placed. Tattooed.                       - Six 5 to 30 mm polyps in the sigmoid colon and in the                        descending colon, removed with a hot snare. Resected                        and retrieved. Clips (MR conditional) were placed.                       - Internal external and internal hemorrhoids. Recommendation:       - Repeat colonoscopy in 6 months for surveillance after  piecemeal polypectomy.                       - Return to GI office in 2 weeks.                       - Discharge patient to home.                       - Resume previous diet today.                       - Continue present medications.                       - Await pathology results.                       - Resume Effient (prasugrel) at prior dose in 2 days.                        Refer to managing physician for further adjustment of                        therapy. Procedure Code(s):    --- Professional ---                       [id number], Colonoscopy, flexible; with removal of tumor(s),                        polyp(s), or other lesion(s) by snare technique                       45381, Colonoscopy, flexible; with directed submucosal                        injection(s), any substance Diagnosis Code(s):    --- Professional ---                       Z12.11, Encounter for screening for malignant neoplasm                        of colon                       Z80.0, Family history of malignant neoplasm of                         digestive organs                       K64.8, Other hemorrhoids                       D12.2, Benign neoplasm of ascending colon                       D12.5, Benign neoplasm of sigmoid colon                       D12.4, Benign neoplasm of descending colon CPT copyright 2017 [employer]. All rights reserved. The codes documented in this report are preliminary and upon coder review may  be revised to meet current  compliance requirements. Dr. Ulyess Mort Lin Landsman MD, MD 12/01/2017 9:49:36 AM This report has been signed electronically. Number of Addenda: 0 Note Initiated On: 12/01/2017 7:50 AM Scope Withdrawal Time: 1 hour 12 minutes 16 seconds  Total Procedure Duration: 1 hour 14 minutes 6 seconds       [facility]

## 2017-12-01 NOTE — H&P (Addendum)
Cephas Darby, MD [address]  [place]  [place], [place] [address]  Main: [phone]  Fax: [phone] Pager: [phone]  Primary Care Physician:  Inc, [facility] Primary Gastroenterologist:  Dr. Cephas Darby  Pre-Procedure History & Physical: HPI:  Scott [name] is a 57 y.o. male is here for an colonoscopy.   Past Medical History:  Diagnosis Date  . Arthritis    "left arm; right leg" (12/13/2014)  . Asthma   . CHF (congestive heart failure) ([place])   . Chronic disease anemia    [name] 12/13/2014  . Chronic kidney disease (CKD), stage IV (severe) ([place])    [name] 12/13/2014... on dialysis  . Complication of anesthesia    unable to urinate after CAPD urgery  . Continuous ambulatory peritoneal dialysis status ([place])   . Coronary artery disease    [name] 12/13/2014  . Depression   . Dysrhythmia    patient unaware of irregular heartbeat  . GERD (gastroesophageal reflux disease)   . High cholesterol    [name] 12/13/2014  . Hypertension   . Non-Q wave myocardial infarction ([place])    [name] 12/13/2014  . PVD (peripheral vascular disease) ([place])    [name] 12/13/2014  . Type II diabetes mellitus ([place])     Past Surgical History:  Procedure Laterality Date  . AV FISTULA PLACEMENT Right 04/07/2017   Procedure: ARTERIOVENOUS (AV) FISTULA CREATION ( BRACHIALCEPHALIC );  Surgeon: Katha Cabal, MD;  Location: [facility] ORS;  Service: Vascular;  Laterality: Right;  . CAPD INSERTION N/A 04/07/2017   Procedure: LAPAROSCOPIC INSERTION CONTINUOUS AMBULATORY PERITONEAL DIALYSIS  (CAPD) CATHETER;  Surgeon: Katha Cabal, MD;  Location: [facility] ORS;  Service: Vascular;  Laterality: N/A;  . CARDIAC CATHETERIZATION  11/2014  . DIALYSIS/PERMA CATHETER INSERTION N/A 01/18/2017   Procedure: Dialysis/Perma Catheter Insertion;  Surgeon: Algernon Huxley, MD;  Location: [place] [facility];  Service: Cardiovascular;  Laterality: N/A;  . DIALYSIS/PERMA CATHETER REMOVAL N/A  07/26/2017   Procedure: DIALYSIS/PERMA CATHETER REMOVAL;  Surgeon: Algernon Huxley, MD;  Location: [place] [facility];  Service: Cardiovascular;  Laterality: N/A;  . INCISION AND DRAINAGE OF WOUND Right ~ 10/2014   "4th toe foot"  . PERCUTANEOUS CORONARY STENT INTERVENTION (PCI-S) N/A 12/14/2014   Procedure: PERCUTANEOUS CORONARY STENT INTERVENTION (PCI-S);  Surgeon: Charolette Forward, MD;  Location: [facility] CATH LAB;  Service: Cardiovascular;  Laterality: N/A;  . PERCUTANEOUS CORONARY STENT INTERVENTION (PCI-S) N/A 12/18/2014   Procedure: PERCUTANEOUS CORONARY STENT INTERVENTION (PCI-S);  Surgeon: Charolette Forward, MD;  Location: [facility] CATH LAB;  Service: Cardiovascular;  Laterality: N/A;  . TOE AMPUTATION Right 2015   4th toe    Prior to Admission medications   Medication Sig Start Date End Date Taking? Authorizing Provider  albuterol (PROVENTIL HFA;VENTOLIN HFA) 108 (90 BASE) MCG/ACT inhaler Inhale 2 puffs into the lungs every 6 (six) hours as needed for wheezing or shortness of breath. 01/10/15  Yes [place] Lighter, MD  amLODipine (NORVASC) 10 MG tablet Take 10 mg by mouth daily.   Yes [provider]  aspirin EC 81 MG tablet Take 1 tablet (81 mg total) by mouth daily. 01/10/15  Yes [place] Lighter, MD  calcium acetate (PHOSLO) 667 MG capsule Take 2,001 mg by mouth 3 (three) times daily with meals.  06/08/17  Yes [provider]  carvedilol (COREG) 25 MG tablet Take 1 tablet (25 mg total) by mouth 2 (two) times daily with a meal. 08/30/17  Yes Alisa Graff, FNP  furosemide (  LASIX) 40 MG tablet Take 40 mg by mouth.   Yes [provider]  insulin aspart protamine- aspart (NOVOLOG MIX 70/30) (70-30) 100 UNIT/ML injection Inject 0.35 mLs (35 Units total) into the skin 2 (two) times daily with a meal. Patient taking differently: Inject 40 Units into the skin 2 (two) times daily with a meal.  01/10/15  Yes [place] Lighter, MD  lisinopril (PRINIVIL,ZESTRIL) 20 MG tablet Take 20 mg  by mouth daily. 02/02/17  Yes [provider]  Multiple Vitamins-Minerals (MULTIVITAMIN ADULT PO) Take by mouth. "Dailyvite"   Yes [provider]  omeprazole (PRILOSEC) 20 MG capsule Take 1 capsule (20 mg total) by mouth daily. 01/10/15  Yes Gladstone Lighter, MD  pioglitazone (ACTOS) 15 MG tablet Take 15 mg by mouth daily.   Yes [provider]  prasugrel (EFFIENT) 10 MG TABS tablet Take 1 tablet (10 mg total) by mouth daily. 01/10/15  Yes [place] Lighter, MD  senna-docusate (SENOKOT-S) 8.6-50 MG tablet Take 2 tablets daily by mouth. 07/11/17 07/11/18 Yes Hinda Kehr, MD  atorvastatin (LIPITOR) 80 MG tablet Take 1 tablet (80 mg total) by mouth daily at 6 PM. Patient not taking: Reported on 11/30/2017 01/10/15   [place] Lighter, MD  Cholecalciferol (VITAMIN D) 2000 units tablet Take 2,000 Units by mouth daily.    [provider]  ferrous sulfate 325 (65 FE) MG tablet Take 325 mg by mouth. 12/19/14   [provider]  hydrALAZINE (APRESOLINE) 25 MG tablet Take 1 tablet (25 mg total) by mouth 3 (three) times daily. 08/05/17 11/03/17  Darylene Price A, FNP  isosorbide-hydrALAZINE (BIDIL) 20-37.5 MG tablet Take by mouth 3 (three) times daily. Needs RX refill    [provider]  nitroGLYCERIN (NITROSTAT) 0.4 MG SL tablet Place 1 tablet (0.4 mg total) under the tongue every 5 (five) minutes x 3 doses as needed for chest pain. Patient not taking: Reported on 11/30/2017 12/19/14   Charolette Forward, MD    Allergies as of 11/30/2017  . (No Known Allergies)    Family History  Problem Relation Age of Onset  . Cancer Mother        Lung  . Cancer Father        Lung  . Diabetes Sister   . Diabetes Brother   . CAD Brother   . Hernia Son   . Cancer Brother     Social History   Socioeconomic History  . Marital status: Divorced    Spouse name: Not on file  . Number of children: Not on file  . Years of education: Not on file  . Highest education  level: Not on file  Occupational History  . Not on file  Social Needs  . Financial resource strain: Not on file  . Food insecurity:    Worry: Not on file    Inability: Not on file  . Transportation needs:    Medical: Not on file    Non-medical: Not on file  Tobacco Use  . Smoking status: Former Smoker    Packs/day: 0.00    Years: 30.00    Pack years: 0.00    Types: Cigarettes    Start date: 08/31/1984    Last attempt to quit: 11/30/2014    Years since quitting: 3.0  . Smokeless tobacco: Never Used  Substance and Sexual Activity  . Alcohol use: Yes    Comment: Rarely, twice a year.  . Drug use: No  . Sexual activity: Not Currently  Lifestyle  . Physical  activity:    Days per week: Not on file    Minutes per session: Not on file  . Stress: Not on file  Relationships  . Social connections:    Talks on phone: Not on file    Gets together: Not on file    Attends religious service: Not on file    Active member of club or organization: Not on file    Attends meetings of clubs or organizations: Not on file    Relationship status: Not on file  . Intimate partner violence:    Fear of current or ex partner: Not on file    Emotionally abused: Not on file    Physically abused: Not on file    Forced sexual activity: Not on file  Other Topics Concern  . Not on file  Social History Narrative  . Not on file    Review of Systems: See HPI, otherwise negative ROS  Physical Exam: BP 117/60   Pulse 66   Temp 98.8 F (37.1 C) (Temporal)   Resp 17   Ht 5\' 9"  (1.753 m)   Wt 247 lb (112 kg)   SpO2 99%   BMI 36.48 kg/m  General:   Alert,  pleasant and cooperative in NAD Head:  Normocephalic and atraumatic. Neck:  Supple; no masses or thyromegaly. Lungs:  Clear throughout to auscultation.    Heart:  Regular rate and rhythm. Abdomen:  Soft, nontender and nondistended. Normal bowel sounds, without guarding, and without rebound.   Neurologic:  Alert and  oriented x4;  grossly  normal neurologically.  Impression/Plan: Scott [name] is here for an colonoscopy to be performed for FOBT+ve  Risks, benefits, limitations, and alternatives regarding  colonoscopy have been reviewed with the patient.  Questions have been answered.  All parties agreeable.   Sherri Sear, MD  12/01/2017, 8:01 AM

## 2017-12-01 NOTE — Anesthesia Postprocedure Evaluation (Signed)
Anesthesia Post Note  Patient: Scott [name]  Procedure(s) Performed: COLONOSCOPY WITH PROPOFOL (N/A ) POLYPECTOMY  Patient location during evaluation: PACU Anesthesia Type: General Level of consciousness: awake and alert Pain management: pain level controlled Vital Signs Assessment: post-procedure vital signs reviewed and stable Respiratory status: spontaneous breathing, nonlabored ventilation, respiratory function stable and patient connected to nasal cannula oxygen Cardiovascular status: blood pressure returned to baseline and stable Postop Assessment: no apparent nausea or vomiting Anesthetic complications: no    Dineen Conradt

## 2017-12-01 NOTE — Transfer of Care (Signed)
Immediate Anesthesia Transfer of Care Note  Patient: Scott [name]  Procedure(s) Performed: COLONOSCOPY WITH PROPOFOL (N/A ) POLYPECTOMY  Patient Location: PACU  Anesthesia Type: General  Level of Consciousness: awake, alert  and patient cooperative  Airway and Oxygen Therapy: Patient Spontanous Breathing and Patient connected to supplemental oxygen  Post-op Assessment: Post-op Vital signs reviewed, Patient's Cardiovascular Status Stable, Respiratory Function Stable, Patent Airway and No signs of Nausea or vomiting  Post-op Vital Signs: Reviewed and stable  Complications: No apparent anesthesia complications

## 2017-12-01 NOTE — Anesthesia Procedure Notes (Signed)
Procedure Name: MAC Performed by: Tawania Daponte, CRNA Pre-anesthesia Checklist: Patient identified, Emergency Drugs available, Suction available, Patient being monitored and Timeout performed Patient Re-evaluated:Patient Re-evaluated prior to induction Oxygen Delivery Method: Nasal cannula       

## 2017-12-02 ENCOUNTER — Encounter: Payer: Self-pay | Admitting: Gastroenterology

## 2017-12-02 ENCOUNTER — Telehealth: Payer: Self-pay

## 2017-12-02 ENCOUNTER — Other Ambulatory Visit: Payer: Self-pay

## 2017-12-02 DIAGNOSIS — Z9889 Other specified postprocedural states: Secondary | ICD-10-CM

## 2017-12-02 NOTE — Telephone Encounter (Signed)
Attempted to contact patient to  Inform him of his 2 week follow up scheduled for Monday April 15th at 2:45pm.  No voice mail service, no answer.  Thanks [employer]

## 2017-12-06 ENCOUNTER — Encounter: Payer: Self-pay | Admitting: Gastroenterology

## 2017-12-10 ENCOUNTER — Encounter: Payer: Self-pay | Admitting: Gastroenterology

## 2017-12-13 ENCOUNTER — Ambulatory Visit: Payer: Medicare Other | Admitting: Gastroenterology

## 2017-12-13 ENCOUNTER — Other Ambulatory Visit: Payer: Self-pay

## 2017-12-13 ENCOUNTER — Encounter: Payer: Self-pay | Admitting: Gastroenterology

## 2017-12-13 VITALS — BP 129/77 | HR 63 | Temp 97.5°F | Ht 70.0 in | Wt 255.6 lb

## 2017-12-13 DIAGNOSIS — Z86010 Personal history of colonic polyps: Secondary | ICD-10-CM | POA: Diagnosis not present

## 2017-12-13 NOTE — Progress Notes (Signed)
Cephas Darby, MD [address]  [place]  [place], [place] [address]  Main: [phone]  Fax: [phone]    Gastroenterology Consultation  Referring Provider:     Inc, [place] Physician:  Inc, [place] Primary Gastroenterologist:  Dr. Cephas Darby Reason for Consultation:     History of colon polyps        HPI:   Scott [name] is a 57 y.o. male referred by Dr. Alison Stalling, [facility]  for consultation & management of history of colon polyps. I recently saw patient at the time of colonoscopy for colon cancer screening.His colonoscopy was prolonged to secondary to significant colon spasm and several polyps. The polyps turned out to be tubular adenoma without evidence of high-grade dysplasia. He is here to discuss about the colonoscopy results. He reports that his bowel movements are irregular, associated with bloating and cramps. He consumes carbonated beverages, red meat and greasy foods on a regular basis. He otherwise denies any GI symptoms.  NSAIDs: none  Antiplts/Anticoagulants/Anti thrombotics: aspirin 81 and Effient 10 mg daily  GI Procedures:  Colonoscopy 12/01/17 - Preparation of the colon was fair. Extemely prolonged duration of procedure due to signifcant colon spasm, limitng removal of large colon polyps efficienctly. Piecemeal resection was performed. Small polyps could be missed - One 20 mm polyp in the ascending colon, removed with a hot snare. Resected and retrieved. Clips (MR conditional) were placed. Tattooed. - Six 5 to 30 mm polyps in the sigmoid colon and in the descending colon, removed with a hot snare. Resected and retrieved. Clips (MR conditional) were placed. - Internal hemorrhoids.   Past Medical History:  Diagnosis Date  . Arthritis    "left arm; right leg" (12/13/2014)  . Asthma   . CHF (congestive heart failure) ([place])   . Chronic disease anemia    [name] 12/13/2014  . Chronic kidney  disease (CKD), stage IV (severe) ([place])    [name] 12/13/2014... on dialysis  . Complication of anesthesia    unable to urinate after CAPD urgery  . Continuous ambulatory peritoneal dialysis status ([place])   . Coronary artery disease    [name] 12/13/2014  . Depression   . Dysrhythmia    patient unaware of irregular heartbeat  . GERD (gastroesophageal reflux disease)   . High cholesterol    [name] 12/13/2014  . Hypertension   . Non-Q wave myocardial infarction ([place])    [name] 12/13/2014  . PVD (peripheral vascular disease) ([place])    [name] 12/13/2014  . Type II diabetes mellitus ([place])     Past Surgical History:  Procedure Laterality Date  . AV FISTULA PLACEMENT Right 04/07/2017   Procedure: ARTERIOVENOUS (AV) FISTULA CREATION ( BRACHIALCEPHALIC );  Surgeon: Katha Cabal, MD;  Location: [facility] ORS;  Service: Vascular;  Laterality: Right;  . CAPD INSERTION N/A 04/07/2017   Procedure: LAPAROSCOPIC INSERTION CONTINUOUS AMBULATORY PERITONEAL DIALYSIS  (CAPD) CATHETER;  Surgeon: Katha Cabal, MD;  Location: [facility] ORS;  Service: Vascular;  Laterality: N/A;  . CARDIAC CATHETERIZATION  11/2014  . COLONOSCOPY WITH PROPOFOL N/A 12/01/2017   Procedure: COLONOSCOPY WITH PROPOFOL;  Surgeon: Lin Landsman, MD;  Location: [place];  Service: Endoscopy;  Laterality: N/A;  Diabetic - insulin  . DIALYSIS/PERMA CATHETER INSERTION N/A 01/18/2017   Procedure: Dialysis/Perma Catheter Insertion;  Surgeon: Algernon Huxley, MD;  Location: [facility];  Service: Cardiovascular;  Laterality: N/A;  . DIALYSIS/PERMA CATHETER REMOVAL N/A 07/26/2017  Procedure: DIALYSIS/PERMA CATHETER REMOVAL;  Surgeon: Algernon Huxley, MD;  Location: [facility] CV LAB;  Service: Cardiovascular;  Laterality: N/A;  . INCISION AND DRAINAGE OF WOUND Right ~ 10/2014   "4th toe foot"  . PERCUTANEOUS CORONARY STENT INTERVENTION (PCI-S) N/A 12/14/2014   Procedure: PERCUTANEOUS CORONARY STENT INTERVENTION (PCI-S);  Surgeon:  Charolette Forward, MD;  Location: [facility] CATH LAB;  Service: Cardiovascular;  Laterality: N/A;  . PERCUTANEOUS CORONARY STENT INTERVENTION (PCI-S) N/A 12/18/2014   Procedure: PERCUTANEOUS CORONARY STENT INTERVENTION (PCI-S);  Surgeon: Charolette Forward, MD;  Location: [facility] CATH LAB;  Service: Cardiovascular;  Laterality: N/A;  . POLYPECTOMY  12/01/2017   Procedure: POLYPECTOMY;  Surgeon: Lin Landsman, MD;  Location: [place];  Service: Endoscopy;;  . TOE AMPUTATION Right 2015   4th toe     Current Outpatient Medications:  .  albuterol (PROVENTIL HFA;VENTOLIN HFA) 108 (90 BASE) MCG/ACT inhaler, Inhale 2 puffs into the lungs every 6 (six) hours as needed for wheezing or shortness of breath., Disp: 1 Inhaler, Rfl: 2 .  amLODipine (NORVASC) 10 MG tablet, Take 10 mg by mouth daily., Disp: , Rfl:  .  aspirin EC 81 MG tablet, Take 1 tablet (81 mg total) by mouth daily., Disp: 30 tablet, Rfl: 0 .  atorvastatin (LIPITOR) 80 MG tablet, Take 1 tablet (80 mg total) by mouth daily at 6 PM., Disp: 30 tablet, Rfl: 3 .  B Complex-C-Folic Acid (DIALYVITE 580) 0.8 MG TABS, Take 1 tablet by mouth daily., Disp: , Rfl: 6 .  calcium acetate (PHOSLO) 667 MG capsule, Take 2,001 mg by mouth 3 (three) times daily with meals. , Disp: , Rfl:  .  carvedilol (COREG) 25 MG tablet, Take 1 tablet (25 mg total) by mouth 2 (two) times daily with a meal., Disp: 180 tablet, Rfl: 3 .  cephALEXin (KEFLEX) 500 MG capsule, , Disp: , Rfl:  .  Cholecalciferol (VITAMIN D) 2000 units tablet, Take 2,000 Units by mouth daily., Disp: , Rfl:  .  ferrous sulfate 325 (65 FE) MG tablet, Take 325 mg by mouth., Disp: , Rfl:  .  fluticasone (FLONASE) 50 MCG/ACT nasal spray, , Disp: , Rfl:  .  furosemide (LASIX) 40 MG tablet, Take 40 mg by mouth., Disp: , Rfl:  .  gentamicin cream (GARAMYCIN) 0.1 %, , Disp: , Rfl:  .  insulin aspart protamine- aspart (NOVOLOG MIX 70/30) (70-30) 100 UNIT/ML injection, Inject 0.35 mLs (35 Units total) into the skin  2 (two) times daily with a meal. (Patient taking differently: Inject 40 Units into the skin 2 (two) times daily with a meal. ), Disp: 20 mL, Rfl: 0 .  isosorbide-hydrALAZINE (BIDIL) 20-37.5 MG tablet, Take by mouth 3 (three) times daily. Needs RX refill, Disp: , Rfl:  .  lisinopril (PRINIVIL,ZESTRIL) 20 MG tablet, Take 20 mg by mouth daily., Disp: , Rfl:  .  Multiple Vitamins-Minerals (MULTIVITAMIN ADULT PO), Take by mouth. "Dailyvite", Disp: , Rfl:  .  nitroGLYCERIN (NITROSTAT) 0.4 MG SL tablet, Place 1 tablet (0.4 mg total) under the tongue every 5 (five) minutes x 3 doses as needed for chest pain., Disp: 25 tablet, Rfl: 12 .  omeprazole (PRILOSEC) 20 MG capsule, Take 1 capsule (20 mg total) by mouth daily., Disp: 30 capsule, Rfl: 0 .  ONE TOUCH ULTRA TEST test strip, , Disp: , Rfl:  .  pioglitazone (ACTOS) 15 MG tablet, Take 15 mg by mouth daily., Disp: , Rfl:  .  prasugrel (EFFIENT) 10 MG TABS tablet, Take  1 tablet (10 mg total) by mouth daily., Disp: 30 tablet, Rfl: 0 .  senna-docusate (SENOKOT-S) 8.6-50 MG tablet, Take 2 tablets daily by mouth., Disp: 60 tablet, Rfl: 11 .  hydrALAZINE (APRESOLINE) 25 MG tablet, Take 1 tablet (25 mg total) by mouth 3 (three) times daily., Disp: 270 tablet, Rfl: 3   Family History  Problem Relation Age of Onset  . Cancer Mother        Lung  . Cancer Father        Lung  . Diabetes Sister   . Diabetes Brother   . CAD Brother   . Hernia Son   . Cancer Brother      Social History   Tobacco Use  . Smoking status: Former Smoker    Packs/day: 0.00    Years: 30.00    Pack years: 0.00    Types: Cigarettes    Start date: 08/31/1984    Last attempt to quit: 11/30/2014    Years since quitting: 3.0  . Smokeless tobacco: Never Used  Substance Use Topics  . Alcohol use: Yes    Comment: Rarely, twice a year.  . Drug use: No    Allergies as of 12/13/2017  . (No Known Allergies)    Review of Systems:    All systems reviewed and negative except where  noted in HPI.   Physical Exam:  BP 129/77   Pulse 63   Temp (!) 97.5 F (36.4 C) (Oral)   Ht 5\' 10"  (1.778 m)   Wt 255 lb 9.6 oz (115.9 kg)   BMI 36.67 kg/m  No LMP for male patient.  General:   Alert,  Well-developed, well-nourished, pleasant and cooperative in NAD Head:  Normocephalic and atraumatic. Eyes:  Sclera clear, no icterus.   Conjunctiva pink. Ears:  Normal auditory acuity. Nose:  No deformity, discharge, or lesions. Mouth:  No deformity or lesions,oropharynx pink & moist. Neck:  Supple; no masses or thyromegaly. Lungs:  Respirations even and unlabored.  Clear throughout to auscultation.   No wheezes, crackles, or rhonchi. No acute distress. Heart:  Regular rate and rhythm; no murmurs, clicks, rubs, or gallops. Abdomen:  Normal bowel sounds. Soft, obese, non-tender and non-distended without masses, hepatosplenomegaly or hernias noted.  No guarding or rebound tenderness.   Rectal: Not performed Msk:  Symmetrical without gross deformities. Good, equal movement & strength bilaterally. Pulses:  Normal pulses noted. Extremities:  No clubbing or edema.  No cyanosis. Neurologic:  Alert and oriented x3;  grossly normal neurologically. Skin:  Intact without significant lesions or rashes. No jaundice. Psych:  Alert and cooperative. Normal mood and affect.  Imaging Studies: reviewed  Assessment and Plan:   Scott [name] is a 57 y.o. male with metabolic syndrome, CHF, arrhythmias, ESRD on peritoneal dialysis underwent colonoscopy for colon cancer screening and found to have several polyps. Histology showed tubular adenomas without evidence of high-grade dysplasia. And, the prep was inadequate  - Recommend repeat colonoscopy in next 2-4 weeks - Clear liquid diet for 2 days prior to procedure - Highly recommended patient to cut back on red meat, carbonated beverages and greasy foods - need clearance from cardiology to hold and coagulation prior to colonoscopy  Follow up  based on the colonoscopy results   Cephas Darby, MD

## 2017-12-28 NOTE — Discharge Instructions (Signed)
General Anesthesia, Adult, Care After °These instructions provide you with information about caring for yourself after your procedure. Your health care provider may also give you more specific instructions. Your treatment has been planned according to current medical practices, but problems sometimes occur. Call your health care provider if you have any problems or questions after your procedure. °What can I expect after the procedure? °After the procedure, it is common to have: °· Vomiting. °· A sore throat. °· Mental slowness. ° °It is common to feel: °· Nauseous. °· Cold or shivery. °· Sleepy. °· Tired. °· Sore or achy, even in parts of your body where you did not have surgery. ° °Follow these instructions at home: °For at least 24 hours after the procedure: °· Do not: °? Participate in activities where you could fall or become injured. °? Drive. °? Use heavy machinery. °? Drink alcohol. °? Take sleeping pills or medicines that cause drowsiness. °? Make important decisions or sign legal documents. °? Take care of children on your own. °· Rest. °Eating and drinking °· If you vomit, drink water, juice, or soup when you can drink without vomiting. °· Drink enough fluid to keep your urine clear or pale yellow. °· Make sure you have little or no nausea before eating solid foods. °· Follow the diet recommended by your health care provider. °General instructions °· Have a responsible adult stay with you until you are awake and alert. °· Return to your normal activities as told by your health care provider. Ask your health care provider what activities are safe for you. °· Take over-the-counter and prescription medicines only as told by your health care provider. °· If you smoke, do not smoke without supervision. °· Keep all follow-up visits as told by your health care provider. This is important. °Contact a health care provider if: °· You continue to have nausea or vomiting at home, and medicines are not helpful. °· You  cannot drink fluids or start eating again. °· You cannot urinate after 8-12 hours. °· You develop a skin rash. °· You have fever. °· You have increasing redness at the site of your procedure. °Get help right away if: °· You have difficulty breathing. °· You have chest pain. °· You have unexpected bleeding. °· You feel that you are having a life-threatening or urgent problem. °This information is not intended to replace advice given to you by your health care provider. Make sure you discuss any questions you have with your health care provider. °Document Released: 11/23/2000 Document Revised: 01/20/2016 Document Reviewed: 08/01/2015 °Elsevier Interactive Patient Education © 2018 [employer] ° °

## 2017-12-29 ENCOUNTER — Ambulatory Visit: Payer: Medicare Other | Admitting: Anesthesiology

## 2017-12-29 ENCOUNTER — Encounter
Admission: RE | Disposition: A | Discharge status: Home / Self Care | Payer: Self-pay | Source: Ambulatory Visit | Attending: Gastroenterology

## 2017-12-29 ENCOUNTER — Ambulatory Visit
Admission: RE | Admit: 2017-12-29 | Discharge: 2017-12-29 | Disposition: A | Discharge status: Home / Self Care | Payer: Medicare Other | Source: Ambulatory Visit | Attending: Gastroenterology | Admitting: Gastroenterology

## 2017-12-29 DIAGNOSIS — Z9889 Other specified postprocedural states: Secondary | ICD-10-CM | POA: Diagnosis not present

## 2017-12-29 DIAGNOSIS — K219 Gastro-esophageal reflux disease without esophagitis: Secondary | ICD-10-CM | POA: Insufficient documentation

## 2017-12-29 DIAGNOSIS — Z860100 Personal history of colon polyps, unspecified: Secondary | ICD-10-CM

## 2017-12-29 DIAGNOSIS — Z79899 Other long term (current) drug therapy: Secondary | ICD-10-CM | POA: Insufficient documentation

## 2017-12-29 DIAGNOSIS — I509 Heart failure, unspecified: Secondary | ICD-10-CM | POA: Diagnosis not present

## 2017-12-29 DIAGNOSIS — K635 Polyp of colon: Secondary | ICD-10-CM | POA: Diagnosis not present

## 2017-12-29 DIAGNOSIS — I252 Old myocardial infarction: Secondary | ICD-10-CM | POA: Insufficient documentation

## 2017-12-29 DIAGNOSIS — N184 Chronic kidney disease, stage 4 (severe): Secondary | ICD-10-CM | POA: Diagnosis not present

## 2017-12-29 DIAGNOSIS — Z87891 Personal history of nicotine dependence: Secondary | ICD-10-CM | POA: Diagnosis not present

## 2017-12-29 DIAGNOSIS — I2510 Atherosclerotic heart disease of native coronary artery without angina pectoris: Secondary | ICD-10-CM | POA: Insufficient documentation

## 2017-12-29 DIAGNOSIS — E1122 Type 2 diabetes mellitus with diabetic chronic kidney disease: Secondary | ICD-10-CM | POA: Diagnosis not present

## 2017-12-29 DIAGNOSIS — Z992 Dependence on renal dialysis: Secondary | ICD-10-CM | POA: Insufficient documentation

## 2017-12-29 DIAGNOSIS — I130 Hypertensive heart and chronic kidney disease with heart failure and stage 1 through stage 4 chronic kidney disease, or unspecified chronic kidney disease: Secondary | ICD-10-CM | POA: Diagnosis not present

## 2017-12-29 DIAGNOSIS — D123 Benign neoplasm of transverse colon: Secondary | ICD-10-CM | POA: Diagnosis not present

## 2017-12-29 DIAGNOSIS — D124 Benign neoplasm of descending colon: Secondary | ICD-10-CM | POA: Diagnosis not present

## 2017-12-29 DIAGNOSIS — E1151 Type 2 diabetes mellitus with diabetic peripheral angiopathy without gangrene: Secondary | ICD-10-CM | POA: Insufficient documentation

## 2017-12-29 DIAGNOSIS — Z955 Presence of coronary angioplasty implant and graft: Secondary | ICD-10-CM | POA: Insufficient documentation

## 2017-12-29 DIAGNOSIS — F329 Major depressive disorder, single episode, unspecified: Secondary | ICD-10-CM | POA: Diagnosis not present

## 2017-12-29 DIAGNOSIS — D125 Benign neoplasm of sigmoid colon: Secondary | ICD-10-CM

## 2017-12-29 DIAGNOSIS — Z794 Long term (current) use of insulin: Secondary | ICD-10-CM | POA: Diagnosis not present

## 2017-12-29 DIAGNOSIS — Z7982 Long term (current) use of aspirin: Secondary | ICD-10-CM | POA: Diagnosis not present

## 2017-12-29 DIAGNOSIS — Z1211 Encounter for screening for malignant neoplasm of colon: Secondary | ICD-10-CM | POA: Insufficient documentation

## 2017-12-29 DIAGNOSIS — E7800 Pure hypercholesterolemia, unspecified: Secondary | ICD-10-CM | POA: Diagnosis not present

## 2017-12-29 HISTORY — PX: POLYPECTOMY: SHX5525

## 2017-12-29 HISTORY — PX: COLONOSCOPY WITH PROPOFOL: SHX5780

## 2017-12-29 LAB — GLUCOSE, CAPILLARY
GLUCOSE-CAPILLARY: 123 mg/dL — AB (ref 65–99)
Glucose-Capillary: 117 mg/dL — ABNORMAL HIGH (ref 65–99)

## 2017-12-29 SURGERY — COLONOSCOPY WITH PROPOFOL
Anesthesia: General | Wound class: Contaminated

## 2017-12-29 MED ORDER — SODIUM CHLORIDE 0.9 % IV SOLN: INTRAVENOUS | Status: DC

## 2017-12-29 MED ORDER — LACTATED RINGERS IV SOLN
INTRAVENOUS | Status: DC | PRN
  Administered 2017-12-29: 08:00:00 via INTRAVENOUS

## 2017-12-29 MED ORDER — LACTATED RINGERS IV SOLN: 10.0000 mL/h | INTRAVENOUS | Status: DC

## 2017-12-29 MED ORDER — LIDOCAINE HCL (CARDIAC) PF 100 MG/5ML IV SOSY
PREFILLED_SYRINGE | INTRAVENOUS | Status: DC | PRN
  Administered 2017-12-29: 50 mg via INTRAVENOUS

## 2017-12-29 MED ORDER — ALBUTEROL SULFATE HFA 108 (90 BASE) MCG/ACT IN AERS
INHALATION_SPRAY | RESPIRATORY_TRACT | Status: DC | PRN
  Administered 2017-12-29: 2 via RESPIRATORY_TRACT

## 2017-12-29 MED ORDER — STERILE WATER FOR IRRIGATION IR SOLN
Status: DC | PRN
  Administered 2017-12-29: 09:00:00

## 2017-12-29 MED ORDER — LIDOCAINE HCL (CARDIAC) PF 100 MG/5ML IV SOSY: PREFILLED_SYRINGE | INTRAVENOUS | Status: DC | PRN

## 2017-12-29 MED ORDER — PROPOFOL 10 MG/ML IV BOLUS
INTRAVENOUS | Status: DC | PRN
  Administered 2017-12-29: 50 mg via INTRAVENOUS
  Administered 2017-12-29: 20 mg via INTRAVENOUS
  Administered 2017-12-29: 30 mg via INTRAVENOUS
  Administered 2017-12-29: 50 mg via INTRAVENOUS
  Administered 2017-12-29: 100 mg via INTRAVENOUS
  Administered 2017-12-29 (×5): 50 mg via INTRAVENOUS

## 2017-12-29 SURGICAL SUPPLY — 25 items
CANISTER SUCT 1200ML W/VALVE (MISCELLANEOUS) ×4 IMPLANT
CLIP HMST 235XBRD CATH ROT (MISCELLANEOUS) IMPLANT
CLIP RESOLUTION 360 11X235 (MISCELLANEOUS)
ELECT REM PT RETURN 9FT ADLT (ELECTROSURGICAL)
ELECTRODE REM PT RTRN 9FT ADLT (ELECTROSURGICAL) IMPLANT
FCP ESCP3.2XJMB 240X2.8X (MISCELLANEOUS)
FORCEPS BIOP RAD 4 LRG CAP 4 (CUTTING FORCEPS) ×4 IMPLANT
FORCEPS BIOP RJ4 240 W/NDL (MISCELLANEOUS)
FORCEPS ESCP3.2XJMB 240X2.8X (MISCELLANEOUS) IMPLANT
GOWN CVR UNV OPN BCK APRN NK (MISCELLANEOUS) ×4 IMPLANT
GOWN ISOL THUMB LOOP REG UNIV (MISCELLANEOUS) ×4
INJECTOR VARIJECT VIN23 (MISCELLANEOUS) IMPLANT
KIT DEFENDO VALVE AND CONN (KITS) IMPLANT
KIT ENDO PROCEDURE OLY (KITS) ×4 IMPLANT
MARKER SPOT ENDO TATTOO 5ML (MISCELLANEOUS) IMPLANT
PROBE APC STR FIRE (PROBE) IMPLANT
RETRIEVER NET ROTH 2.5X230 LF (MISCELLANEOUS) IMPLANT
SNARE COLD EXACTO (MISCELLANEOUS) IMPLANT
SNARE SHORT THROW 13M SML OVAL (MISCELLANEOUS) ×4 IMPLANT
SNARE SHORT THROW 30M LRG OVAL (MISCELLANEOUS) IMPLANT
SNARE SNG USE RND 15MM (INSTRUMENTS) IMPLANT
SPOT EX ENDOSCOPIC TATTOO (MISCELLANEOUS)
TRAP ETRAP POLY (MISCELLANEOUS) ×4 IMPLANT
VARIJECT INJECTOR VIN23 (MISCELLANEOUS)
WATER STERILE IRR 250ML POUR (IV SOLUTION) ×4 IMPLANT

## 2017-12-29 NOTE — Anesthesia Postprocedure Evaluation (Signed)
Anesthesia Post Note  Patient: Scott [name]  Procedure(s) Performed: COLONOSCOPY WITH PROPOFOL (N/A ) POLYPECTOMY  Patient location during evaluation: PACU Anesthesia Type: General Level of consciousness: awake and alert Pain management: pain level controlled Vital Signs Assessment: post-procedure vital signs reviewed and stable Respiratory status: spontaneous breathing Cardiovascular status: blood pressure returned to baseline Postop Assessment: no headache Anesthetic complications: no    Jaci Standard, III,  Matilde Markie D

## 2017-12-29 NOTE — Anesthesia Procedure Notes (Signed)
Procedure Name: MAC Date/Time: 12/29/2017 8:25 AM Performed by: Janna Arch, CRNA Pre-anesthesia Checklist: Patient identified, Emergency Drugs available, Suction available and Patient being monitored Patient Re-evaluated:Patient Re-evaluated prior to induction Oxygen Delivery Method: Nasal cannula

## 2017-12-29 NOTE — Anesthesia Preprocedure Evaluation (Addendum)
Anesthesia Evaluation  Patient identified by MRN, date of birth, ID band Patient awake    Reviewed: Allergy & Precautions, H&P , NPO status , Patient's Chart, lab work & pertinent test results  Airway Mallampati: II  TM Distance: >3 FB Neck ROM: full    Dental  (+) Edentulous Lower   Pulmonary former smoker,    Pulmonary exam normal        Cardiovascular hypertension, + CAD, + Past MI and +CHF  Normal cardiovascular exam     Neuro/Psych PSYCHIATRIC DISORDERS Depression    GI/Hepatic Medicated,  Endo/Other  diabetes, Well Controlled, Type 2, Insulin Dependent  Renal/GU Dialysis     Musculoskeletal   Abdominal   Peds  Hematology   Anesthesia Other Findings   Reproductive/Obstetrics                           Anesthesia Physical Anesthesia Plan  ASA: III  Anesthesia Plan: General   Post-op Pain Management:    Induction:   PONV Risk Score and Plan:   Airway Management Planned:   Additional Equipment:   Intra-op Plan:   Post-operative Plan:   Informed Consent: I have reviewed the patients History and Physical, chart, labs and discussed the procedure including the risks, benefits and alternatives for the proposed anesthesia with the patient or authorized representative who has indicated his/her understanding and acceptance.     Plan Discussed with:   Anesthesia Plan Comments:         Anesthesia Quick Evaluation                                  Anesthesia Evaluation  Patient identified by MRN, date of birth, ID band  Reviewed: NPO status   History of Anesthesia Complications Negative for: history of anesthetic complications  Airway Mallampati: II  TM Distance: >3 FB Neck ROM: full    Dental no notable dental hx. (+) Edentulous Lower, Edentulous Upper   Pulmonary asthma , former smoker,    Pulmonary exam normal        Cardiovascular Exercise  Tolerance: Good hypertension, + CAD (stent x 3 2017), + Past MI (2017), + Peripheral Vascular Disease and +CHF (hx of)  Normal cardiovascular exam   ekg: 01/2017: Sinus rhythm Nonspecific intraventricular conduction delay Nonspecific repol abnormality, lateral leads;  echo: 10/2016: Left ventricle: The cavity size was normal. Systolic function was normal. The estimated ejection fraction was in the range of 55% to 60%. Wall motion was normal; there were no regional wall motion abnormalities. Left ventricular diastolic function parameters were normal. - Left atrium: The atrium was mildly dilated. - Right ventricle: Systolic function was normal. - Pulmonary arteries: Systolic pressure was within the normal   cards note: 10/2016: dr. Saralyn Pilar: stable;      Neuro/Psych PSYCHIATRIC DISORDERS Depression negative neurological ROS     GI/Hepatic negative GI ROS, Neg liver ROS, GERD  Controlled,  Endo/Other  diabetes  Renal/GU ESRF and DialysisRenal disease (on PDialysis; last 4/3)  negative genitourinary   Musculoskeletal  (+) Arthritis ,   Abdominal   Peds  Hematology  (+) anemia ,   Anesthesia Other Findings Fell in shower 4/2 > no LOC;  Last effient 5 days ago.  Reproductive/Obstetrics  Anesthesia Physical Anesthesia Plan  ASA: III  Anesthesia Plan: General   Post-op Pain Management:    Induction:   PONV Risk Score and Plan:   Airway Management Planned: Natural Airway  Additional Equipment:   Intra-op Plan:   Post-operative Plan:   Informed Consent: I have reviewed the patients History and Physical, chart, labs and discussed the procedure including the risks, benefits and alternatives for the proposed anesthesia with the patient or authorized representative who has indicated his/her understanding and acceptance.     Plan Discussed with: CRNA  Anesthesia Plan Comments:         Anesthesia  Quick Evaluation

## 2017-12-29 NOTE — H&P (Signed)
Cephas Darby, MD [address]  [place]  [place], [place] [address]  Main: [phone]  Fax: [phone] Pager: [phone]  Primary Care Physician:  Inc, [facility] Primary Gastroenterologist:  Dr. Cephas Darby  Pre-Procedure History & Physical: HPI:  Scott [name] is a 57 y.o. male is here for an colonoscopy.   Past Medical History:  Diagnosis Date  . Arthritis    "left arm; right leg" (12/13/2014)  . Asthma   . CHF (congestive heart failure) ([place])   . Chronic disease anemia    [name] 12/13/2014  . Chronic kidney disease (CKD), stage IV (severe) ([place])    [name] 12/13/2014... on dialysis  . Complication of anesthesia    unable to urinate after CAPD urgery  . Continuous ambulatory peritoneal dialysis status ([place])   . Coronary artery disease    [name] 12/13/2014  . Depression   . Dysrhythmia    patient unaware of irregular heartbeat  . GERD (gastroesophageal reflux disease)   . High cholesterol    [name] 12/13/2014  . Hypertension   . Non-Q wave myocardial infarction ([place])    [name] 12/13/2014  . PVD (peripheral vascular disease) ([place])    [name] 12/13/2014  . Type II diabetes mellitus ([place])     Past Surgical History:  Procedure Laterality Date  . AV FISTULA PLACEMENT Right 04/07/2017   Procedure: ARTERIOVENOUS (AV) FISTULA CREATION ( BRACHIALCEPHALIC );  Surgeon: Katha Cabal, MD;  Location: [facility] ORS;  Service: Vascular;  Laterality: Right;  . CAPD INSERTION N/A 04/07/2017   Procedure: LAPAROSCOPIC INSERTION CONTINUOUS AMBULATORY PERITONEAL DIALYSIS  (CAPD) CATHETER;  Surgeon: Katha Cabal, MD;  Location: [facility] ORS;  Service: Vascular;  Laterality: N/A;  . CARDIAC CATHETERIZATION  11/2014  . COLONOSCOPY WITH PROPOFOL N/A 12/01/2017   Procedure: COLONOSCOPY WITH PROPOFOL;  Surgeon: Lin Landsman, MD;  Location: [facility];  Service: Endoscopy;  Laterality: N/A;  Diabetic - insulin  . DIALYSIS/PERMA CATHETER INSERTION N/A  01/18/2017   Procedure: Dialysis/Perma Catheter Insertion;  Surgeon: Algernon Huxley, MD;  Location: [facility];  Service: Cardiovascular;  Laterality: N/A;  . DIALYSIS/PERMA CATHETER REMOVAL N/A 07/26/2017   Procedure: DIALYSIS/PERMA CATHETER REMOVAL;  Surgeon: Algernon Huxley, MD;  Location: [facility];  Service: Cardiovascular;  Laterality: N/A;  . INCISION AND DRAINAGE OF WOUND Right ~ 10/2014   "4th toe foot"  . PERCUTANEOUS CORONARY STENT INTERVENTION (PCI-S) N/A 12/14/2014   Procedure: PERCUTANEOUS CORONARY STENT INTERVENTION (PCI-S);  Surgeon: Charolette Forward, MD;  Location: [facility] CATH LAB;  Service: Cardiovascular;  Laterality: N/A;  . PERCUTANEOUS CORONARY STENT INTERVENTION (PCI-S) N/A 12/18/2014   Procedure: PERCUTANEOUS CORONARY STENT INTERVENTION (PCI-S);  Surgeon: Charolette Forward, MD;  Location: [facility] CATH LAB;  Service: Cardiovascular;  Laterality: N/A;  . POLYPECTOMY  12/01/2017   Procedure: POLYPECTOMY;  Surgeon: Lin Landsman, MD;  Location: [facility];  Service: Endoscopy;;  . TOE AMPUTATION Right 2015   4th toe    Prior to Admission medications   Medication Sig Start Date End Date Taking? Authorizing Provider  albuterol (PROVENTIL HFA;VENTOLIN HFA) 108 (90 BASE) MCG/ACT inhaler Inhale 2 puffs into the lungs every 6 (six) hours as needed for wheezing or shortness of breath. 01/10/15  Yes Gladstone Lighter, MD  amLODipine (NORVASC) 10 MG tablet Take 10 mg by mouth daily.   Yes [provider]  aspirin EC 81 MG tablet Take 1 tablet (81 mg total) by mouth daily. 01/10/15  Yes Kalisetti,  Hart Rochester, MD  atorvastatin (LIPITOR) 80 MG tablet Take 1 tablet (80 mg total) by mouth daily at 6 PM. 01/10/15  Yes Gladstone Lighter, MD  B Complex-C-Folic Acid (DIALYVITE 846) 0.8 MG TABS Take 1 tablet by mouth daily. 10/25/17  Yes [provider]  calcium acetate (PHOSLO) 667 MG capsule Take 2,001 mg by mouth 3 (three) times daily with meals.  06/08/17  Yes  [provider]  carvedilol (COREG) 25 MG tablet Take 1 tablet (25 mg total) by mouth 2 (two) times daily with a meal. 08/30/17  Yes Darylene Price A, FNP  cephALEXin (KEFLEX) 500 MG capsule  11/19/17  Yes [provider]  Cholecalciferol (VITAMIN D) 2000 units tablet Take 2,000 Units by mouth daily.   Yes [provider]  ferrous sulfate 325 (65 FE) MG tablet Take 325 mg by mouth. 12/19/14  Yes [provider]  fluticasone (FLONASE) 50 MCG/ACT nasal spray  12/09/17  Yes [provider]  furosemide (LASIX) 40 MG tablet Take 40 mg by mouth.   Yes [provider]  hydrALAZINE (APRESOLINE) 25 MG tablet Take 1 tablet (25 mg total) by mouth 3 (three) times daily. 08/05/17 12/29/17 Yes Hackney, Otila Kluver A, FNP  insulin aspart protamine- aspart (NOVOLOG MIX 70/30) (70-30) 100 UNIT/ML injection Inject 0.35 mLs (35 Units total) into the skin 2 (two) times daily with a meal. Patient taking differently: Inject 40 Units into the skin 2 (two) times daily with a meal.  01/10/15  Yes Gladstone Lighter, MD  isosorbide-hydrALAZINE (BIDIL) 20-37.5 MG tablet Take by mouth 3 (three) times daily. Needs RX refill   Yes [provider]  lisinopril (PRINIVIL,ZESTRIL) 20 MG tablet Take 20 mg by mouth daily. 02/02/17  Yes [provider]  Multiple Vitamins-Minerals (MULTIVITAMIN ADULT PO) Take by mouth. "Dailyvite"   Yes [provider]  omeprazole (PRILOSEC) 20 MG capsule Take 1 capsule (20 mg total) by mouth daily. 01/10/15  Yes Gladstone Lighter, MD  pioglitazone (ACTOS) 15 MG tablet Take 15 mg by mouth daily.   Yes [provider]  prasugrel (EFFIENT) 10 MG TABS tablet Take 1 tablet (10 mg total) by mouth daily. 01/10/15  Yes Gladstone Lighter, MD  senna-docusate (SENOKOT-S) 8.6-50 MG tablet Take 2 tablets daily by mouth. 07/11/17 07/11/18 Yes Hinda Kehr, MD  gentamicin cream (GARAMYCIN) 0.1 %  11/19/17   [provider]    nitroGLYCERIN (NITROSTAT) 0.4 MG SL tablet Place 1 tablet (0.4 mg total) under the tongue every 5 (five) minutes x 3 doses as needed for chest pain. 12/19/14   Charolette Forward, MD  ONE TOUCH ULTRA TEST test strip  11/25/17   [provider]    Allergies as of 12/13/2017  . (No Known Allergies)    Family History  Problem Relation Age of Onset  . Cancer Mother        Lung  . Cancer Father        Lung  . Diabetes Sister   . Diabetes Brother   . CAD Brother   . Hernia Son   . Cancer Brother     Social History   Socioeconomic History  . Marital status: Divorced    Spouse name: Not on file  . Number of children: Not on file  . Years of education: Not on file  . Highest education level: Not on file  Occupational History  . Not on file  Social Needs  . Financial resource strain: Not on file  . Food insecurity:  Worry: Not on file    Inability: Not on file  . Transportation needs:    Medical: Not on file    Non-medical: Not on file  Tobacco Use  . Smoking status: Former Smoker    Packs/day: 0.00    Years: 30.00    Pack years: 0.00    Types: Cigarettes    Start date: 08/31/1984    Last attempt to quit: 11/30/2014    Years since quitting: 3.0  . Smokeless tobacco: Never Used  Substance and Sexual Activity  . Alcohol use: Yes    Comment: Rarely, twice a year.  . Drug use: No  . Sexual activity: Not Currently  Lifestyle  . Physical activity:    Days per week: Not on file    Minutes per session: Not on file  . Stress: Not on file  Relationships  . Social connections:    Talks on phone: Not on file    Gets together: Not on file    Attends religious service: Not on file    Active member of club or organization: Not on file    Attends meetings of clubs or organizations: Not on file    Relationship status: Not on file  . Intimate partner violence:    Fear of current or ex partner: Not on file    Emotionally abused: Not on file    Physically abused: Not on  file    Forced sexual activity: Not on file  Other Topics Concern  . Not on file  Social History Narrative  . Not on file    Review of Systems: See HPI, otherwise negative ROS  Physical Exam: BP 138/68   Pulse 66   Temp 98.1 F (36.7 C)   Ht 5\' 10"  (1.778 m)   Wt 246 lb (111.6 kg)   SpO2 98%   BMI 35.30 kg/m  General:   Alert,  pleasant and cooperative in NAD Head:  Normocephalic and atraumatic. Neck:  Supple; no masses or thyromegaly. Lungs:  Clear throughout to auscultation.    Heart:  Regular rate and rhythm. Abdomen:  Soft, nontender and nondistended. Normal bowel sounds, without guarding, and without rebound.   Neurologic:  Alert and  oriented x4;  grossly normal neurologically.  Impression/Plan: Scott [name] is here for an colonoscopy to be performed for personal h/o colon polyps, inadequate prep  Risks, benefits, limitations, and alternatives regarding  colonoscopy have been reviewed with the patient.  Questions have been answered.  All parties agreeable.   Sherri Sear, MD  12/29/2017, 8:13 AM

## 2017-12-29 NOTE — Op Note (Signed)
[facility] Patient Name: Scott [name] Procedure Date: 12/29/2017 8:03 AM MRN: [id number] Account #: [id number] Date of Birth: [birth year] Admit Type: Outpatient Age: 57 Room: [facility] OR ROOM 01 Gender: Male Note Status: Finalized Procedure:            Colonoscopy Indications:          Surveillance: History of adenomatous polyps, inadequate                        prep on last exam (<21yr, Last colonoscopy: April 2019 Providers:            RLin LandsmanMD, MD Medicines:            Monitored Anesthesia Care Complications:        No immediate complications. Estimated blood loss:                        Minimal. Procedure:            Pre-Anesthesia Assessment:                       - Prior to the procedure, a History and Physical was                        performed, and patient medications and allergies were                        reviewed. The patient is competent. The risks and                        benefits of the procedure and the sedation options and                        risks were discussed with the patient. All questions                        were answered and informed consent was obtained.                        Patient identification and proposed procedure were                        verified by the physician, the nurse, the                        anesthesiologist, the anesthetist and the technician in                        the pre-procedure area in the procedure room in the                        endoscopy suite. Mental Status Examination: alert and                        oriented. Airway Examination: normal oropharyngeal                        airway and neck mobility. Respiratory Examination:  clear to auscultation. CV Examination: normal.                        Prophylactic Antibiotics: The patient does not require                        prophylactic antibiotics. Prior Anticoagulants: The    patient has taken Effient (prasugrel), last dose was 7                        days prior to procedure. ASA Grade Assessment: III - A                        patient with severe systemic disease. After reviewing                        the risks and benefits, the patient was deemed in                        satisfactory condition to undergo the procedure. The                        anesthesia plan was to use monitored anesthesia care                        (MAC). Immediately prior to administration of                        medications, the patient was re-assessed for adequacy                        to receive sedatives. The heart rate, respiratory rate,                        oxygen saturations, blood pressure, adequacy of                        pulmonary ventilation, and response to care were                        monitored throughout the procedure. The physical status                        of the patient was re-assessed after the procedure.                       After obtaining informed consent, the colonoscope was                        passed under direct vision. Throughout the procedure,                        the patient's blood pressure, pulse, and oxygen                        saturations were monitored continuously. The [place] [phone]) was introduced through the  anus and advanced to the the terminal ileum. The                        colonoscopy was somewhat difficult due to colonic                        spasm. The patient tolerated the procedure well. The                        quality of the bowel preparation was adequate. Findings:      The perianal and digital rectal examinations were normal. Pertinent       negatives include normal sphincter tone and no palpable rectal lesions.      The terminal ileum appeared normal.      Three sessile polyps were found in the sigmoid colon, descending colon       and  transverse colon. The polyps were 5 to 6 mm in size. These polyps       were removed with a hot snare and cold snare. Resection and retrieval       were complete.      Prior poypectomy sites were identifed with an ulcer and clip      A post polypectomy scar was found in the transverse colon. There was       residual polypoid tissue. This was biopsied with a cold forceps for       histology.      The retroflexed view of the distal rectum and anal verge was normal and       showed no anal or rectal abnormalities. Impression:           - The examined portion of the ileum was normal.                       - Three 5 to 6 mm polyps in the sigmoid colon, in the                        descending colon and in the transverse colon, removed                        with a cold snare. Resected and retrieved.                       - Post-polypectomy scar in the transverse colon.                        Biopsied.                       - The distal rectum and anal verge are normal on                        retroflexion view. Recommendation:       - Discharge patient to home.                       - Resume previous diet today.                       - Continue present medications.                       -  Await pathology results.                       - Repeat colonoscopy in 3 years for surveillance of                        multiple polyps.                       - Resume Effient (prasugrel) at prior dose today. Refer                        to managing physician for further adjustment of therapy. Procedure Code(s):    --- Professional ---                       [phone], Colonoscopy, flexible; with removal of tumor(s),                        polyp(s), or other lesion(s) by snare technique                       [id number], 75, Colonoscopy, flexible; with biopsy, single                        or multiple Diagnosis Code(s):    --- Professional ---                       Z86.010, Personal history of colonic polyps                        D12.5, Benign neoplasm of sigmoid colon                       D12.4, Benign neoplasm of descending colon                       D12.3, Benign neoplasm of transverse colon (hepatic                        flexure or splenic flexure)                       Z98.890, Other specified postprocedural states CPT copyright 2017 [employer]. All rights reserved. The codes documented in this report are preliminary and upon coder review may  be revised to meet current compliance requirements. Dr. Ulyess Mort Lin Landsman MD, MD 12/29/2017 9:05:10 AM This report has been signed electronically. Number of Addenda: 0 Note Initiated On: 12/29/2017 8:03 AM Scope Withdrawal Time: 0 hours 24 minutes 4 seconds  Total Procedure Duration: 0 hours 26 minutes 13 seconds       [facility]

## 2017-12-29 NOTE — Transfer of Care (Signed)
Immediate Anesthesia Transfer of Care Note  Patient: Scott [name]  Procedure(s) Performed: COLONOSCOPY WITH PROPOFOL (N/A ) POLYPECTOMY  Patient Location: PACU  Anesthesia Type: General  Level of Consciousness: awake, alert  and patient cooperative  Airway and Oxygen Therapy: Patient Spontanous Breathing and Patient connected to supplemental oxygen  Post-op Assessment: Post-op Vital signs reviewed, Patient's Cardiovascular Status Stable, Respiratory Function Stable, Patent Airway and No signs of Nausea or vomiting  Post-op Vital Signs: Reviewed and stable  Complications: No apparent anesthesia complications

## 2017-12-30 ENCOUNTER — Telehealth: Payer: Self-pay | Admitting: Gastroenterology

## 2017-12-30 NOTE — Telephone Encounter (Signed)
Elmyra Ricks from [facility]ology called to ask about pt Biopsy polyp hactomy site #2 please call her at [phone]

## 2017-12-31 ENCOUNTER — Encounter: Payer: Self-pay | Admitting: Gastroenterology

## 2018-01-02 ENCOUNTER — Encounter: Payer: Self-pay | Admitting: Gastroenterology

## 2018-01-03 ENCOUNTER — Encounter: Payer: Self-pay | Admitting: Gastroenterology

## 2018-02-22 ENCOUNTER — Ambulatory Visit: Payer: Medicare Other | Attending: Family | Admitting: Family

## 2018-02-22 ENCOUNTER — Encounter: Payer: Self-pay | Admitting: Family

## 2018-02-22 VITALS — BP 145/81 | HR 65 | Resp 18 | Ht 69.0 in | Wt 239.5 lb

## 2018-02-22 DIAGNOSIS — Z79899 Other long term (current) drug therapy: Secondary | ICD-10-CM | POA: Diagnosis not present

## 2018-02-22 DIAGNOSIS — N186 End stage renal disease: Secondary | ICD-10-CM | POA: Insufficient documentation

## 2018-02-22 DIAGNOSIS — Z992 Dependence on renal dialysis: Secondary | ICD-10-CM | POA: Insufficient documentation

## 2018-02-22 DIAGNOSIS — F329 Major depressive disorder, single episode, unspecified: Secondary | ICD-10-CM | POA: Diagnosis not present

## 2018-02-22 DIAGNOSIS — I132 Hypertensive heart and chronic kidney disease with heart failure and with stage 5 chronic kidney disease, or end stage renal disease: Secondary | ICD-10-CM | POA: Insufficient documentation

## 2018-02-22 DIAGNOSIS — K219 Gastro-esophageal reflux disease without esophagitis: Secondary | ICD-10-CM | POA: Insufficient documentation

## 2018-02-22 DIAGNOSIS — Z7982 Long term (current) use of aspirin: Secondary | ICD-10-CM | POA: Diagnosis not present

## 2018-02-22 DIAGNOSIS — E1122 Type 2 diabetes mellitus with diabetic chronic kidney disease: Secondary | ICD-10-CM | POA: Diagnosis not present

## 2018-02-22 DIAGNOSIS — Z8249 Family history of ischemic heart disease and other diseases of the circulatory system: Secondary | ICD-10-CM | POA: Diagnosis not present

## 2018-02-22 DIAGNOSIS — Z87891 Personal history of nicotine dependence: Secondary | ICD-10-CM | POA: Insufficient documentation

## 2018-02-22 DIAGNOSIS — E7800 Pure hypercholesterolemia, unspecified: Secondary | ICD-10-CM | POA: Diagnosis not present

## 2018-02-22 DIAGNOSIS — I252 Old myocardial infarction: Secondary | ICD-10-CM | POA: Diagnosis not present

## 2018-02-22 DIAGNOSIS — J45909 Unspecified asthma, uncomplicated: Secondary | ICD-10-CM | POA: Diagnosis not present

## 2018-02-22 DIAGNOSIS — Z794 Long term (current) use of insulin: Secondary | ICD-10-CM

## 2018-02-22 DIAGNOSIS — I10 Essential (primary) hypertension: Secondary | ICD-10-CM

## 2018-02-22 DIAGNOSIS — Z955 Presence of coronary angioplasty implant and graft: Secondary | ICD-10-CM | POA: Insufficient documentation

## 2018-02-22 DIAGNOSIS — E1151 Type 2 diabetes mellitus with diabetic peripheral angiopathy without gangrene: Secondary | ICD-10-CM | POA: Diagnosis not present

## 2018-02-22 DIAGNOSIS — I5032 Chronic diastolic (congestive) heart failure: Secondary | ICD-10-CM | POA: Diagnosis not present

## 2018-02-22 DIAGNOSIS — I2510 Atherosclerotic heart disease of native coronary artery without angina pectoris: Secondary | ICD-10-CM | POA: Diagnosis not present

## 2018-02-22 NOTE — Progress Notes (Signed)
Patient ID: Scott [name], male    DOB: [birth year], 57 y.o.   MRN: [id number]  HPI  Scott [name] is a 57 yoM with PMH of HTN, ESRD on dialysis M/W/F, T2DM, HLD, CAD, NSTEMI s/p stents 4/17, congestive heart failure with preserved ejection fraction.  Echo on 11/13/16 showed EF of 55-60% similar to echo on 01/09/15 showing EF of 55-65%  Has not been in the ED or admitted in the last 6 months.   Presents today for a follow-up visit with a chief complaint of minimal shortness of breath upon moderate exertion. He describes this as chronic in nature having been present for several years. He has associated fatigue and cough along with this. He denies any difficulty sleeping, abdominal distention, palpitations, pedal edema, chest pain, dizziness or weight gain.   Past Medical History:  Diagnosis Date  . Arthritis    "left arm; right leg" (12/13/2014)  . Asthma   . CHF (congestive heart failure) ([place])   . Chronic disease anemia    [name] 12/13/2014  . Chronic kidney disease (CKD), stage IV (severe) ([place])    [name] 12/13/2014... on dialysis  . Complication of anesthesia    unable to urinate after CAPD urgery  . Continuous ambulatory peritoneal dialysis status ([place])   . Coronary artery disease    [name] 12/13/2014  . Depression   . Dysrhythmia    patient unaware of irregular heartbeat  . GERD (gastroesophageal reflux disease)   . High cholesterol    [name] 12/13/2014  . Hypertension   . Non-Q wave myocardial infarction ([place])    [name] 12/13/2014  . PVD (peripheral vascular disease) ([place])    [name] 12/13/2014  . Type II diabetes mellitus ([place])    Past Surgical History:  Procedure Laterality Date  . AV FISTULA PLACEMENT Right 04/07/2017   Procedure: ARTERIOVENOUS (AV) FISTULA CREATION ( BRACHIALCEPHALIC );  Surgeon: Katha Cabal, MD;  Location: [facility];  Service: Vascular;  Laterality: Right;  . CAPD INSERTION N/A 04/07/2017   Procedure: LAPAROSCOPIC INSERTION CONTINUOUS AMBULATORY  PERITONEAL DIALYSIS  (CAPD) CATHETER;  Surgeon: Katha Cabal, MD;  Location: [facility];  Service: Vascular;  Laterality: N/A;  . CARDIAC CATHETERIZATION  11/2014  . COLONOSCOPY WITH PROPOFOL N/A 12/01/2017   Procedure: COLONOSCOPY WITH PROPOFOL;  Surgeon: Lin Landsman, MD;  Location: [place];  Service: Endoscopy;  Laterality: N/A;  Diabetic - insulin  . COLONOSCOPY WITH PROPOFOL N/A 12/29/2017   Procedure: COLONOSCOPY WITH PROPOFOL;  Surgeon: Lin Landsman, MD;  Location: [place];  Service: Endoscopy;  Laterality: N/A;  . DIALYSIS/PERMA CATHETER INSERTION N/A 01/18/2017   Procedure: Dialysis/Perma Catheter Insertion;  Surgeon: Algernon Huxley, MD;  Location: [place] [facility];  Service: Cardiovascular;  Laterality: N/A;  . DIALYSIS/PERMA CATHETER REMOVAL N/A 07/26/2017   Procedure: DIALYSIS/PERMA CATHETER REMOVAL;  Surgeon: Algernon Huxley, MD;  Location: [place] [facility];  Service: Cardiovascular;  Laterality: N/A;  . INCISION AND DRAINAGE OF WOUND Right ~ 10/2014   "4th toe foot"  . PERCUTANEOUS CORONARY STENT INTERVENTION (PCI-S) N/A 12/14/2014   Procedure: PERCUTANEOUS CORONARY STENT INTERVENTION (PCI-S);  Surgeon: Charolette Forward, MD;  Location: [facility];  Service: Cardiovascular;  Laterality: N/A;  . PERCUTANEOUS CORONARY STENT INTERVENTION (PCI-S) N/A 12/18/2014   Procedure: PERCUTANEOUS CORONARY STENT INTERVENTION (PCI-S);  Surgeon: Charolette Forward, MD;  Location: [facility];  Service: Cardiovascular;  Laterality: N/A;  . POLYPECTOMY  12/01/2017   Procedure: POLYPECTOMY;  Surgeon: Lin Landsman,  MD;  Location: [place];  Service: Endoscopy;;  . POLYPECTOMY  12/29/2017   Procedure: POLYPECTOMY;  Surgeon: Lin Landsman, MD;  Location: [place];  Service: Endoscopy;;  . TOE AMPUTATION Right 2015   4th toe   Family History  Problem Relation Age of Onset  . Cancer Mother        Lung  . Cancer Father        Lung  .  Diabetes Sister   . Diabetes Brother   . CAD Brother   . Hernia Son   . Cancer Brother    Social History   Tobacco Use  . Smoking status: Former Smoker    Packs/day: 0.00    Years: 30.00    Pack years: 0.00    Types: Cigarettes    Start date: 08/31/1984    Last attempt to quit: 11/30/2014    Years since quitting: 3.2  . Smokeless tobacco: Never Used  Substance Use Topics  . Alcohol use: Yes    Comment: Rarely, twice a year.   No Known Allergies  Prior to Admission medications   Medication Sig Start Date End Date Taking? Authorizing Provider  amLODipine (NORVASC) 10 MG tablet Take 10 mg by mouth daily.   Yes [provider]  aspirin EC 81 MG tablet Take 1 tablet (81 mg total) by mouth daily. 01/10/15  Yes Gladstone Lighter, MD  B Complex-C-Folic Acid (DIALYVITE 270) 0.8 MG TABS Take 1 tablet by mouth daily. 10/25/17  Yes [provider]  carvedilol (COREG) 25 MG tablet Take 1 tablet (25 mg total) by mouth 2 (two) times daily with a meal. 08/30/17  Yes Daniesha Driver A, FNP  furosemide (LASIX) 40 MG tablet Take 40 mg by mouth.   Yes [provider]  nitroGLYCERIN (NITROSTAT) 0.4 MG SL tablet Place 1 tablet (0.4 mg total) under the tongue every 5 (five) minutes x 3 doses as needed for chest pain. 12/19/14  Yes Charolette Forward, MD  omeprazole (PRILOSEC) 20 MG capsule Take 1 capsule (20 mg total) by mouth daily. 01/10/15  Yes Gladstone Lighter, MD  ONE TOUCH ULTRA TEST test strip  11/25/17  Yes [provider]  prasugrel (EFFIENT) 10 MG TABS tablet Take 1 tablet (10 mg total) by mouth daily. 01/10/15  Yes Gladstone Lighter, MD  albuterol (PROVENTIL HFA;VENTOLIN HFA) 108 (90 BASE) MCG/ACT inhaler Inhale 2 puffs into the lungs every 6 (six) hours as needed for wheezing or shortness of breath. 01/10/15   Gladstone Lighter, MD  atorvastatin (LIPITOR) 80 MG tablet Take 1 tablet (80 mg total) by mouth daily at 6 PM. 01/10/15   Gladstone Lighter, MD  calcium  acetate (PHOSLO) 667 MG capsule Take 2,001 mg by mouth 3 (three) times daily with meals.  06/08/17   [provider]  Cholecalciferol (VITAMIN D) 2000 units tablet Take 2,000 Units by mouth daily.    [provider]  hydrALAZINE (APRESOLINE) 25 MG tablet Take 1 tablet (25 mg total) by mouth 3 (three) times daily. 08/05/17 12/29/17  Alisa Graff, FNP  insulin aspart protamine- aspart (NOVOLOG MIX 70/30) (70-30) 100 UNIT/ML injection Inject 0.35 mLs (35 Units total) into the skin 2 (two) times daily with a meal. Patient not taking: Reported on 02/22/2018 01/10/15   Gladstone Lighter, MD    Review of Systems  Constitutional: Positive for fatigue. Negative for appetite change.  HENT: Negative for congestion, postnasal drip and sore throat.   Respiratory: Positive for cough and shortness of  breath (minimal). Negative for chest tightness.   Cardiovascular: Negative for chest pain, palpitations and leg swelling.  Gastrointestinal: Negative for abdominal distention and abdominal pain.  Endocrine: Negative.   Genitourinary: Negative.   Musculoskeletal: Negative for back pain and neck pain.  Skin: Negative.   Allergic/Immunologic: Negative.   Neurological: Negative for dizziness, light-headedness and headaches.  Hematological: Negative for adenopathy. Does not bruise/bleed easily.  Psychiatric/Behavioral: Negative for dysphoric mood and sleep disturbance. The patient is not nervous/anxious.    Vitals:   02/22/18 1112  BP: (!) 145/81  Pulse: 65  Resp: 18  SpO2: 99%  Weight: 239 lb 8 oz (108.6 kg)  Height: 5\' 9"  (1.753 m)   Wt Readings from Last 3 Encounters:  02/22/18 239 lb 8 oz (108.6 kg)  12/29/17 246 lb (111.6 kg)  12/13/17 255 lb 9.6 oz (115.9 kg)    Lab Results  Component Value Date   CREATININE 7.67 (H) 07/10/2017   CREATININE 4.94 (H) 03/25/2017   CREATININE 3.01 (H) 02/10/2017    Physical Exam  Constitutional: He is oriented to person, place, and time. He  appears well-developed and well-nourished.  HENT:  Head: Normocephalic and atraumatic.  Neck: Normal range of motion. Neck supple. No JVD present.  Cardiovascular: Normal rate and regular rhythm.  Pulmonary/Chest: Effort normal. He has no wheezes. He has no rales.  Abdominal: Soft. He exhibits no distension. There is no tenderness.  Musculoskeletal: He exhibits edema (1+ pitting edema bilateral lower legs). He exhibits no tenderness.  Neurological: He is alert and oriented to person, place, and time.  Skin: Skin is warm and dry.  Psychiatric: He has a normal mood and affect. His behavior is normal. Thought content normal.  Nursing note and vitals reviewed.   Assessment & Plan:  1. Chronic heart failure with preserved ejection fraction- - NYHA class II - minimally fluid overloaded today - Not adding salt to foods. Reminded to follow < 2000 mg sodium diet.  - weighing daily and weight has declined. reminded him to call for overnight weight gain of >2 pounds or weekly weight gain of >5 pounds - weight down 4 pounds since he was last here - saw cardiologist ([place]) 10/21/16 - has increased his exercise and he was encouraged to continue his exercise  2. Diabetes- - checking 3x daily; fasting this morning at home was 145 - 11/13/16 A1c 7.0%; says that his most recent A1c was 5.0% - hasn't been using his insulin due to the decline in his glucose levels - saw endocrinologist ([place]) 12/21/17  3. HTN - BP looks good today - BMP on 11/19/17 reviewed and shows sodium 145, potassium 4.0 and GFR 9 - says that he saw his PCP at [facility] a couple of weeks ago  4. ESRD- - does home dialysis three times daily and for ~ 15 minutes each time  Medication bottles were reviewed.   Return in 6 months or sooner for any questions/problems before then.

## 2018-02-22 NOTE — Patient Instructions (Signed)
Continue weighing daily and call for an overnight weight gain of > 2 pounds or a weekly weight gain of >5 pounds. 

## 2018-03-09 ENCOUNTER — Emergency Department: Payer: Medicare Other

## 2018-03-09 ENCOUNTER — Inpatient Hospital Stay
Admit: 2018-03-09 | Discharge: 2018-03-09 | Disposition: A | Discharge status: Home / Self Care | Payer: Medicare Other | Attending: Specialist | Admitting: Specialist

## 2018-03-09 ENCOUNTER — Inpatient Hospital Stay
Admission: EM | Admit: 2018-03-09 | Discharge: 2018-03-12 | DRG: 291 | Disposition: A | Discharge status: Home / Self Care | Payer: Medicare Other | Attending: Internal Medicine | Admitting: Internal Medicine

## 2018-03-09 ENCOUNTER — Other Ambulatory Visit: Payer: Self-pay

## 2018-03-09 DIAGNOSIS — I252 Old myocardial infarction: Secondary | ICD-10-CM

## 2018-03-09 DIAGNOSIS — Z833 Family history of diabetes mellitus: Secondary | ICD-10-CM

## 2018-03-09 DIAGNOSIS — J9601 Acute respiratory failure with hypoxia: Secondary | ICD-10-CM | POA: Diagnosis present

## 2018-03-09 DIAGNOSIS — E1151 Type 2 diabetes mellitus with diabetic peripheral angiopathy without gangrene: Secondary | ICD-10-CM | POA: Diagnosis present

## 2018-03-09 DIAGNOSIS — Z801 Family history of malignant neoplasm of trachea, bronchus and lung: Secondary | ICD-10-CM | POA: Diagnosis not present

## 2018-03-09 DIAGNOSIS — E1143 Type 2 diabetes mellitus with diabetic autonomic (poly)neuropathy: Secondary | ICD-10-CM | POA: Diagnosis present

## 2018-03-09 DIAGNOSIS — Z992 Dependence on renal dialysis: Secondary | ICD-10-CM | POA: Diagnosis not present

## 2018-03-09 DIAGNOSIS — D631 Anemia in chronic kidney disease: Secondary | ICD-10-CM | POA: Diagnosis present

## 2018-03-09 DIAGNOSIS — K3184 Gastroparesis: Secondary | ICD-10-CM | POA: Diagnosis present

## 2018-03-09 DIAGNOSIS — I132 Hypertensive heart and chronic kidney disease with heart failure and with stage 5 chronic kidney disease, or end stage renal disease: Secondary | ICD-10-CM | POA: Diagnosis present

## 2018-03-09 DIAGNOSIS — E11319 Type 2 diabetes mellitus with unspecified diabetic retinopathy without macular edema: Secondary | ICD-10-CM | POA: Diagnosis present

## 2018-03-09 DIAGNOSIS — Z87891 Personal history of nicotine dependence: Secondary | ICD-10-CM | POA: Diagnosis not present

## 2018-03-09 DIAGNOSIS — Z8249 Family history of ischemic heart disease and other diseases of the circulatory system: Secondary | ICD-10-CM

## 2018-03-09 DIAGNOSIS — Z89421 Acquired absence of other right toe(s): Secondary | ICD-10-CM

## 2018-03-09 DIAGNOSIS — I5020 Unspecified systolic (congestive) heart failure: Secondary | ICD-10-CM

## 2018-03-09 DIAGNOSIS — Z7982 Long term (current) use of aspirin: Secondary | ICD-10-CM | POA: Diagnosis not present

## 2018-03-09 DIAGNOSIS — E1122 Type 2 diabetes mellitus with diabetic chronic kidney disease: Secondary | ICD-10-CM | POA: Diagnosis present

## 2018-03-09 DIAGNOSIS — N2581 Secondary hyperparathyroidism of renal origin: Secondary | ICD-10-CM | POA: Diagnosis present

## 2018-03-09 DIAGNOSIS — N186 End stage renal disease: Secondary | ICD-10-CM | POA: Diagnosis present

## 2018-03-09 DIAGNOSIS — Z794 Long term (current) use of insulin: Secondary | ICD-10-CM

## 2018-03-09 DIAGNOSIS — I2510 Atherosclerotic heart disease of native coronary artery without angina pectoris: Secondary | ICD-10-CM | POA: Diagnosis present

## 2018-03-09 DIAGNOSIS — J45909 Unspecified asthma, uncomplicated: Secondary | ICD-10-CM | POA: Diagnosis present

## 2018-03-09 DIAGNOSIS — I5033 Acute on chronic diastolic (congestive) heart failure: Secondary | ICD-10-CM | POA: Diagnosis present

## 2018-03-09 DIAGNOSIS — K219 Gastro-esophageal reflux disease without esophagitis: Secondary | ICD-10-CM | POA: Diagnosis present

## 2018-03-09 DIAGNOSIS — I5023 Acute on chronic systolic (congestive) heart failure: Secondary | ICD-10-CM

## 2018-03-09 DIAGNOSIS — I509 Heart failure, unspecified: Secondary | ICD-10-CM

## 2018-03-09 LAB — CBC WITH DIFFERENTIAL/PLATELET
BASOS ABS: 0.1 10*3/uL (ref 0–0.1)
Basophils Relative: 1 %
EOS ABS: 0.1 10*3/uL (ref 0–0.7)
EOS PCT: 1 %
HCT: 31.6 % — ABNORMAL LOW (ref 40.0–52.0)
Hemoglobin: 10.6 g/dL — ABNORMAL LOW (ref 13.0–18.0)
Lymphocytes Relative: 6 %
Lymphs Abs: 0.7 10*3/uL — ABNORMAL LOW (ref 1.0–3.6)
MCH: 31.6 pg (ref 26.0–34.0)
MCHC: 33.6 g/dL (ref 32.0–36.0)
MCV: 94.2 fL (ref 80.0–100.0)
Monocytes Absolute: 1.2 10*3/uL — ABNORMAL HIGH (ref 0.2–1.0)
Monocytes Relative: 9 %
Neutro Abs: 10.2 10*3/uL — ABNORMAL HIGH (ref 1.4–6.5)
Neutrophils Relative %: 83 %
Platelets: 248 10*3/uL (ref 150–440)
RBC: 3.36 MIL/uL — AB (ref 4.40–5.90)
RDW: 15.2 % — ABNORMAL HIGH (ref 11.5–14.5)
WBC: 12.3 10*3/uL — AB (ref 3.8–10.6)

## 2018-03-09 LAB — BASIC METABOLIC PANEL
Anion gap: 10 (ref 5–15)
BUN: 49 mg/dL — ABNORMAL HIGH (ref 6–20)
CO2: 22 mmol/L (ref 22–32)
CREATININE: 6.91 mg/dL — AB (ref 0.61–1.24)
Calcium: 7.4 mg/dL — ABNORMAL LOW (ref 8.9–10.3)
Chloride: 106 mmol/L (ref 98–111)
GFR calc Af Amer: 9 mL/min — ABNORMAL LOW (ref >60)
GFR, EST NON AFRICAN AMERICAN: 8 mL/min — AB (ref >60)
Glucose, Bld: 147 mg/dL — ABNORMAL HIGH (ref 70–99)
Potassium: 4.6 mmol/L (ref 3.5–5.1)
SODIUM: 138 mmol/L (ref 135–145)

## 2018-03-09 LAB — GLUCOSE, CAPILLARY
Glucose-Capillary: 120 mg/dL — ABNORMAL HIGH (ref 70–99)
Glucose-Capillary: 118 mg/dL — ABNORMAL HIGH (ref 70–99)
Glucose-Capillary: 130 mg/dL — ABNORMAL HIGH (ref 70–99)

## 2018-03-09 LAB — ECHOCARDIOGRAM COMPLETE
HEIGHTINCHES: 69 in
WEIGHTICAEL: 3625.6 [oz_av]

## 2018-03-09 LAB — TROPONIN I: Troponin I: <0.03 ng/mL (ref <0.03)

## 2018-03-09 LAB — MRSA PCR SCREENING: MRSA by PCR: NEGATIVE

## 2018-03-09 MED ORDER — ONDANSETRON HCL 4 MG/2ML IJ SOLN
4.0000 mg | Freq: Once | INTRAMUSCULAR | Status: AC
  Administered 2018-03-09: 4 mg via INTRAVENOUS
  Filled 2018-03-09: qty 2

## 2018-03-09 MED ORDER — GENTAMICIN SULFATE 0.1 % EX CREA
1.0000 "application " | TOPICAL_CREAM | Freq: Every day | CUTANEOUS | Status: DC
  Administered 2018-03-11: 1 via TOPICAL
  Filled 2018-03-09: qty 15

## 2018-03-09 MED ORDER — ONDANSETRON HCL 4 MG PO TABS: 4.0000 mg | ORAL_TABLET | Freq: Four times a day (QID) | ORAL | Status: DC | PRN

## 2018-03-09 MED ORDER — GUAIFENESIN-DM 100-10 MG/5ML PO SYRP
5.0000 mL | ORAL_SOLUTION | ORAL | Status: DC | PRN
  Administered 2018-03-09 – 2018-03-12 (×5): 5 mL via ORAL
  Filled 2018-03-09 (×5): qty 5

## 2018-03-09 MED ORDER — VITAMIN D3 25 MCG (1000 UNIT) PO TABS
2000.0000 [IU] | ORAL_TABLET | Freq: Every day | ORAL | Status: DC
  Administered 2018-03-09 – 2018-03-12 (×4): 2000 [IU] via ORAL
  Filled 2018-03-09 (×4): qty 2

## 2018-03-09 MED ORDER — ASPIRIN EC 81 MG PO TBEC
81.0000 mg | DELAYED_RELEASE_TABLET | Freq: Every day | ORAL | Status: DC
  Administered 2018-03-09 – 2018-03-12 (×4): 81 mg via ORAL
  Filled 2018-03-09 (×4): qty 1

## 2018-03-09 MED ORDER — AMLODIPINE BESYLATE 10 MG PO TABS
10.0000 mg | ORAL_TABLET | Freq: Every day | ORAL | Status: DC
  Administered 2018-03-09 – 2018-03-12 (×4): 10 mg via ORAL
  Filled 2018-03-09 (×4): qty 1

## 2018-03-09 MED ORDER — ACETAMINOPHEN 325 MG PO TABS: 650.0000 mg | ORAL_TABLET | Freq: Four times a day (QID) | ORAL | Status: DC | PRN

## 2018-03-09 MED ORDER — ALBUTEROL SULFATE HFA 108 (90 BASE) MCG/ACT IN AERS: 2.0000 | INHALATION_SPRAY | Freq: Four times a day (QID) | RESPIRATORY_TRACT | Status: DC | PRN

## 2018-03-09 MED ORDER — RENA-VITE PO TABS
1.0000 | ORAL_TABLET | Freq: Every day | ORAL | Status: DC
  Administered 2018-03-09 – 2018-03-12 (×4): 1 via ORAL
  Filled 2018-03-09 (×4): qty 1

## 2018-03-09 MED ORDER — FUROSEMIDE 10 MG/ML IJ SOLN
80.0000 mg | Freq: Every day | INTRAMUSCULAR | Status: DC
  Administered 2018-03-09 – 2018-03-10 (×2): 80 mg via INTRAVENOUS
  Filled 2018-03-09 (×2): qty 8

## 2018-03-09 MED ORDER — HEPARIN 1000 UNIT/ML FOR PERITONEAL DIALYSIS
500.0000 [IU] | INTRAMUSCULAR | Status: DC | PRN
  Filled 2018-03-09: qty 0.5

## 2018-03-09 MED ORDER — DIALYVITE 800 0.8 MG PO TABS: 1.0000 | ORAL_TABLET | Freq: Every day | ORAL | Status: DC

## 2018-03-09 MED ORDER — CALCITRIOL 0.25 MCG PO CAPS
0.5000 ug | ORAL_CAPSULE | Freq: Every day | ORAL | Status: DC
  Administered 2018-03-09 – 2018-03-12 (×4): 0.5 ug via ORAL
  Filled 2018-03-09 (×4): qty 2

## 2018-03-09 MED ORDER — HYDRALAZINE HCL 25 MG PO TABS
25.0000 mg | ORAL_TABLET | Freq: Three times a day (TID) | ORAL | Status: DC
  Administered 2018-03-09 – 2018-03-11 (×8): 25 mg via ORAL
  Filled 2018-03-09 (×8): qty 1

## 2018-03-09 MED ORDER — ONDANSETRON HCL 4 MG/2ML IJ SOLN: 4.0000 mg | Freq: Four times a day (QID) | INTRAMUSCULAR | Status: DC | PRN

## 2018-03-09 MED ORDER — ALBUTEROL SULFATE (2.5 MG/3ML) 0.083% IN NEBU: 2.5000 mg | INHALATION_SOLUTION | Freq: Four times a day (QID) | RESPIRATORY_TRACT | Status: DC | PRN

## 2018-03-09 MED ORDER — HEPARIN SODIUM (PORCINE) 5000 UNIT/ML IJ SOLN
5000.0000 [IU] | Freq: Three times a day (TID) | INTRAMUSCULAR | Status: DC
Start: 2018-03-09 — End: 2018-03-12
  Administered 2018-03-09 – 2018-03-12 (×8): 5000 [IU] via SUBCUTANEOUS
  Filled 2018-03-09 (×9): qty 1

## 2018-03-09 MED ORDER — FUROSEMIDE 10 MG/ML IJ SOLN
20.0000 mg | Freq: Once | INTRAMUSCULAR | Status: AC
  Administered 2018-03-09: 20 mg via INTRAVENOUS
  Filled 2018-03-09: qty 4

## 2018-03-09 MED ORDER — INSULIN ASPART 100 UNIT/ML ~~LOC~~ SOLN
0.0000 [IU] | Freq: Every day | SUBCUTANEOUS | Status: DC
  Administered 2018-03-10: 2 [IU] via SUBCUTANEOUS
  Administered 2018-03-11: 3 [IU] via SUBCUTANEOUS
  Filled 2018-03-09 (×2): qty 1

## 2018-03-09 MED ORDER — NITROGLYCERIN 0.4 MG SL SUBL: 0.4000 mg | SUBLINGUAL_TABLET | SUBLINGUAL | Status: DC | PRN

## 2018-03-09 MED ORDER — SODIUM CHLORIDE 0.9 % IV BOLUS
500.0000 mL | Freq: Once | INTRAVENOUS | Status: AC
  Administered 2018-03-09: 500 mL via INTRAVENOUS

## 2018-03-09 MED ORDER — ACETAMINOPHEN 650 MG RE SUPP: 650.0000 mg | Freq: Four times a day (QID) | RECTAL | Status: DC | PRN

## 2018-03-09 MED ORDER — CARVEDILOL 25 MG PO TABS
25.0000 mg | ORAL_TABLET | Freq: Two times a day (BID) | ORAL | Status: DC
  Administered 2018-03-09 – 2018-03-12 (×6): 25 mg via ORAL
  Filled 2018-03-09 (×6): qty 1

## 2018-03-09 MED ORDER — INSULIN ASPART 100 UNIT/ML ~~LOC~~ SOLN
0.0000 [IU] | Freq: Three times a day (TID) | SUBCUTANEOUS | Status: DC
  Administered 2018-03-10: 3 [IU] via SUBCUTANEOUS
  Administered 2018-03-10: 1 [IU] via SUBCUTANEOUS
  Administered 2018-03-11: 5 [IU] via SUBCUTANEOUS
  Administered 2018-03-11: 2 [IU] via SUBCUTANEOUS
  Administered 2018-03-11: 1 [IU] via SUBCUTANEOUS
  Administered 2018-03-12: 2 [IU] via SUBCUTANEOUS
  Filled 2018-03-09 (×6): qty 1

## 2018-03-09 MED ORDER — CALCIUM ACETATE (PHOS BINDER) 667 MG PO CAPS
2001.0000 mg | ORAL_CAPSULE | Freq: Three times a day (TID) | ORAL | Status: DC
  Administered 2018-03-09 – 2018-03-12 (×8): 2001 mg via ORAL
  Filled 2018-03-09 (×8): qty 3

## 2018-03-09 NOTE — Progress Notes (Signed)
*  PRELIMINARY RESULTS* Echocardiogram 2D Echocardiogram has been performed.  Scott [name] 03/09/2018, 2:36 PM

## 2018-03-09 NOTE — Progress Notes (Signed)
PD tx start. Pt weight 103.1kg, 4 exchanges, 8 hours, 10 minute fill,  20 minute drain, 1700 fill volume, 4.25%.    03/09/18 2235  Cycler Setup  Total Number of Exchanges 4  Fill Volume 1700  Dianeal Solution Dextrose 4.25% in 6000 mL  Last Fill Volume 0  Fill Time - Minute(s) 10  Drain Time - Minute(s) 20 mins  Completion  Exit Site Care Performed Yes  Fluid Balance - CCPD  Total Intake for Exchanges (mL) 6800 ml  Education / Care Plan  Dialysis Education Provided Yes  Documented Education in Care Plan Yes  Hand-Off documentation  Report given to (Full Name) Stark Bray  Report received from (Full Name) Olevia Bowens

## 2018-03-09 NOTE — ED Triage Notes (Signed)
Pt reports lower abdominal pain, cough, nausea. States he feels dehydrated. Runny nose. Pt alert and oriented X4, active, cooperative, pt in NAD. RR even and unlabored, color WNL.

## 2018-03-09 NOTE — Progress Notes (Signed)
[facility]  ROUNDING NOTE   Subjective:  Patient well-known to [place]. We follow him for outpatient continue stimulatory peritoneal dialysis. He performs 4 exchanges, 1500 cc fill volume. He reports typically that he has ultrafiltration of 400 to 500 cc per exchange. Recently he developed cough and shortness of breath which have been progressive in nature. He cannot walk very far without having to rest. Chest x-ray was performed and demonstrated pulmonary vascular congestion.   Objective:  Vital signs in last 24 hours:  Temp:  [98.1 F (36.7 C)-98.6 F (37 C)] 98.6 F (37 C) (07/10 1223) Pulse Rate:  [70-72] 72 (07/10 1223) Resp:  [16-34] 26 (07/10 1130) BP: (119-148)/(60-79) 145/79 (07/10 1223) SpO2:  [84 %-98 %] 98 % (07/10 1223) Weight:  [102.8 kg (226 lb 9.6 oz)-108.4 kg (239 lb)] 102.8 kg (226 lb 9.6 oz) (07/10 1223)  Weight change:  Filed Weights   03/09/18 0844 03/09/18 1223  Weight: 108.4 kg (239 lb) 102.8 kg (226 lb 9.6 oz)    Intake/Output: No intake/output data recorded.   Intake/Output this shift:  Total I/O In: 740 [P.O.:240; IV Piggyback:500] Out: 0   Physical Exam: General: No acute distress  Head: Normocephalic, atraumatic. Moist oral mucosal membranes  Eyes: Anicteric  Neck: Supple, trachea midline  Lungs:   Basilar rales, normal effort  Heart: S1S2 no rubs  Abdomen:  Soft, nontender, bowel sounds present  Extremities: 1+ peripheral edema.  Neurologic: Awake, alert, following commands  Skin: No lesions  Access: Peritoneal dialysis catheter in place    Basic Metabolic Panel: Recent Labs  Lab 03/09/18 0903  NA 138  K 4.6  CL 106  CO2 22  GLUCOSE 147*  BUN 49*  CREATININE 6.91*  CALCIUM 7.4*    Liver Function Tests: No results for input(s): AST, ALT, ALKPHOS, BILITOT, PROT, ALBUMIN in the last 168 hours. No results for input(s): LIPASE, AMYLASE in the last 168 hours. No results for input(s): AMMONIA in the last 168  hours.  CBC: Recent Labs  Lab 03/09/18 0903  WBC 12.3*  NEUTROABS 10.2*  HGB 10.6*  HCT 31.6*  MCV 94.2  PLT 248    Cardiac Enzymes: Recent Labs  Lab 03/09/18 0903  TROPONINI <0.03    BNP: Invalid input(s): POCBNP  CBG: Recent Labs  Lab 03/09/18 1224  GLUCAP 120*    Microbiology: Results for orders placed or performed during the hospital encounter of 07/10/17  Body fluid culture     Status: None   Collection Time: 07/11/17  1:15 AM  Result Value Ref Range Status   Specimen Description PERITONEAL DIALYSATE  Final   Special Requests Normal  Final   Gram Stain NO WBC SEEN NO ORGANISMS SEEN   Final   Culture   Final    NO GROWTH 3 DAYS Performed at [facility], [address], [place], [place] [address]    Report Status 07/14/2017 FINAL  Final    Coagulation Studies: No results for input(s): LABPROT, INR in the last 72 hours.  Urinalysis: No results for input(s): COLORURINE, LABSPEC, PHURINE, GLUCOSEU, HGBUR, BILIRUBINUR, KETONESUR, PROTEINUR, UROBILINOGEN, NITRITE, LEUKOCYTESUR in the last 72 hours.  Invalid input(s): APPERANCEUR    Imaging: Dg Chest 2 View  Result Date: 03/09/2018 CLINICAL DATA:  Cough and shortness of breath EXAM: CHEST - 2 VIEW COMPARISON:  February 10, 2017 FINDINGS: The interstitium is mildly prominent; suspect mild interstitial edema. There is no airspace consolidation. There is cardiomegaly with mild pulmonary venous hypertension. No adenopathy.  No bone lesions. IMPRESSION: Pulmonary vascular congestion. Interstitial prominent with probable mild interstitial edema. No consolidation. No adenopathy evident. Electronically Signed   By: Lowella Grip III M.D.   On: 03/09/2018 09:34     Medications:    . amLODipine  10 mg Oral Daily  . aspirin EC  81 mg Oral Daily  . calcitRIOL  0.5 mcg Oral Daily  . calcium acetate  2,001 mg Oral TID WC  . carvedilol  25 mg Oral BID WC  . cholecalciferol  2,000 Units Oral Daily  .  furosemide  80 mg Intravenous Daily  . heparin  5,000 Units Subcutaneous Q8H  . hydrALAZINE  25 mg Oral TID  . insulin aspart  0-5 Units Subcutaneous QHS  . insulin aspart  0-9 Units Subcutaneous TID WC  . multivitamin  1 tablet Oral Daily   acetaminophen **OR** acetaminophen, albuterol, nitroGLYCERIN, ondansetron **OR** ondansetron (ZOFRAN) IV  Assessment/ Plan:  57 y.o. male with end stage renal disease on peritoneal dialysis, congestive heart failure, GERD, hypertension, peripheral vascular disease, diabetes mellitus type 2, diabetic retinopathy, diabetic gastroparesis   [facility]/PD/[address]   1.  ESRD on peritoneal dialysis. 2.  Acute pulmonary edema. 3.  Anemia of chronic kidney disease. 4.  Secondary hyperparathyroidism. 5.  Hypertension.  Plan: The patient presents primarily with increasing shortness of breath and cough.  Chest x-ray revealed pulmonary edema.  Per the patient's report it does not appears that he is having ultrafiltration failure as he normally has ultrafiltration of 400 to 500 cc per exchange.  He is on CAPD at home.  We will switch the patient to CCPD for now.  We will use 4.25% dextrose solution.  2D echocardiogram has been performed but we are awaiting official results.  Consider cardiology consultation as well.  Patient still may be drinking more than he is ultrafiltrating.  We will need to reinforce fluid restriction.  Further plan as patient progresses.   LOS: 0 Carlyon Nolasco 7/10/20193:30 PM

## 2018-03-09 NOTE — ED Notes (Signed)
Patient's room air saturation dropping to 83%. Patient placed on 2L [place]. Oxygen saturation now at 94%. MD made aware.

## 2018-03-09 NOTE — Progress Notes (Signed)
Pre-PD tx assessment    03/09/18 2244  Neurological  Level of Consciousness Alert  Orientation Level Oriented X4  Respiratory  Respiratory Pattern Regular  Chest Assessment Chest expansion symmetrical  Vascular  R Radial Pulse +2  L Radial Pulse +2  Edema Generalized;Right lower extremity;Left lower extremity  Integumentary  Integumentary (WDL) X  Skin Color Appropriate for ethnicity  Musculoskeletal  Musculoskeletal (WDL) WDL  GU Assessment  Genitourinary (WDL) X  Genitourinary Symptoms  (PD)  Psychosocial  Psychosocial (WDL) WDL

## 2018-03-09 NOTE — ED Notes (Signed)
Patient reports lack of appetite since Saturday.  Nausea and dry heaving.  Patient also reports cough and congestion and shortness of breath when he lies flat.

## 2018-03-09 NOTE — ED Provider Notes (Addendum)
[facility] Emergency Department Provider Note  ____________________________________________  Time seen: Approximately 9:05 AM  I have reviewed the triage vital signs and the nursing notes.   HISTORY  Chief Complaint Abdominal Pain; Cough; and Nausea    HPI Scott [name] is a 57 y.o. male with a history of congestive heart failure, diabetes, hypertension, ESRD on PD, referred for kidney transplant evaluation who complains of nausea, malaise, nonproductive cough, runny nose for the past 2 weeks.  He endorses orthopnea, but reports this is been going on for many months and is not new.  No new leg swelling.  No fevers chills or sweats.  Symptoms are intermittent without aggravating or alleviating factors, moderate severity.  Denies chest pain  He reports being seen in clinic yesterday.  He reports they drew a sample of his peritoneal fluid which was clear and negative for infection.  He denies any cloudy fluid or pain with PD or abdominal pain.  No vomiting.  I am unable to find records of any of this in the EMR.      Past Medical History:  Diagnosis Date  . Arthritis    "left arm; right leg" (12/13/2014)  . Asthma   . CHF (congestive heart failure) ([place])   . Chronic disease anemia    [name] 12/13/2014  . Chronic kidney disease (CKD), stage IV (severe) ([place])    [name] 12/13/2014... on dialysis  . Complication of anesthesia    unable to urinate after CAPD urgery  . Continuous ambulatory peritoneal dialysis status ([place])   . Coronary artery disease    [name] 12/13/2014  . Depression   . Dysrhythmia    patient unaware of irregular heartbeat  . GERD (gastroesophageal reflux disease)   . High cholesterol    [name] 12/13/2014  . Hypertension   . Non-Q wave myocardial infarction ([place])    [name] 12/13/2014  . PVD (peripheral vascular disease) ([place])    [name] 12/13/2014  . Type II diabetes mellitus [facility])      Patient Active Problem List   Diagnosis Date  Noted  . History of colonic polyps   . Positive fecal occult blood test   . End stage renal disease ([place]) 11/25/2016  . Tobacco abuse 11/25/2016  . CHF (congestive heart failure) ([place]) 11/13/2016  . Diabetes ([place]) 09/17/2015  . Decreased renal function 05/23/2015  . GERD (gastroesophageal reflux disease) 04/04/2015  . Hypertension 02/26/2015     Past Surgical History:  Procedure Laterality Date  . AV FISTULA PLACEMENT Right 04/07/2017   Procedure: ARTERIOVENOUS (AV) FISTULA CREATION ( BRACHIALCEPHALIC );  Surgeon: Katha Cabal, MD;  Location: [facility] ORS;  Service: Vascular;  Laterality: Right;  . CAPD INSERTION N/A 04/07/2017   Procedure: LAPAROSCOPIC INSERTION CONTINUOUS AMBULATORY PERITONEAL DIALYSIS  (CAPD) CATHETER;  Surgeon: Katha Cabal, MD;  Location: [facility] ORS;  Service: Vascular;  Laterality: N/A;  . CARDIAC CATHETERIZATION  11/2014  . COLONOSCOPY WITH PROPOFOL N/A 12/01/2017   Procedure: COLONOSCOPY WITH PROPOFOL;  Surgeon: Lin Landsman, MD;  Location: [place];  Service: Endoscopy;  Laterality: N/A;  Diabetic - insulin  . COLONOSCOPY WITH PROPOFOL N/A 12/29/2017   Procedure: COLONOSCOPY WITH PROPOFOL;  Surgeon: Lin Landsman, MD;  Location: [place];  Service: Endoscopy;  Laterality: N/A;  . DIALYSIS/PERMA CATHETER INSERTION N/A 01/18/2017   Procedure: Dialysis/Perma Catheter Insertion;  Surgeon: Algernon Huxley, MD;  Location: [place] CV LAB;  Service: Cardiovascular;  Laterality: N/A;  . DIALYSIS/PERMA CATHETER REMOVAL N/A  07/26/2017   Procedure: DIALYSIS/PERMA CATHETER REMOVAL;  Surgeon: Algernon Huxley, MD;  Location: [place] CV LAB;  Service: Cardiovascular;  Laterality: N/A;  . INCISION AND DRAINAGE OF WOUND Right ~ 10/2014   "4th toe foot"  . PERCUTANEOUS CORONARY STENT INTERVENTION (PCI-S) N/A 12/14/2014   Procedure: PERCUTANEOUS CORONARY STENT INTERVENTION (PCI-S);  Surgeon: Charolette Forward, MD;  Location: [facility] CATH LAB;  Service:  Cardiovascular;  Laterality: N/A;  . PERCUTANEOUS CORONARY STENT INTERVENTION (PCI-S) N/A 12/18/2014   Procedure: PERCUTANEOUS CORONARY STENT INTERVENTION (PCI-S);  Surgeon: Charolette Forward, MD;  Location: [facility] CATH LAB;  Service: Cardiovascular;  Laterality: N/A;  . POLYPECTOMY  12/01/2017   Procedure: POLYPECTOMY;  Surgeon: Lin Landsman, MD;  Location: [place];  Service: Endoscopy;;  . POLYPECTOMY  12/29/2017   Procedure: POLYPECTOMY;  Surgeon: Lin Landsman, MD;  Location: [place];  Service: Endoscopy;;  . TOE AMPUTATION Right 2015   4th toe     Prior to Admission medications   Medication Sig Start Date End Date Taking? Authorizing Provider  albuterol (PROVENTIL HFA;VENTOLIN HFA) 108 (90 BASE) MCG/ACT inhaler Inhale 2 puffs into the lungs every 6 (six) hours as needed for wheezing or shortness of breath. 01/10/15   Gladstone Lighter, MD  amLODipine (NORVASC) 10 MG tablet Take 10 mg by mouth daily.    [provider]  aspirin EC 81 MG tablet Take 1 tablet (81 mg total) by mouth daily. 01/10/15   Gladstone Lighter, MD  atorvastatin (LIPITOR) 80 MG tablet Take 1 tablet (80 mg total) by mouth daily at 6 PM. 01/10/15   Gladstone Lighter, MD  B Complex-C-Folic Acid (DIALYVITE 299) 0.8 MG TABS Take 1 tablet by mouth daily. 10/25/17   [provider]  calcium acetate (PHOSLO) 667 MG capsule Take 2,001 mg by mouth 3 (three) times daily with meals.  06/08/17   [provider]  carvedilol (COREG) 25 MG tablet Take 1 tablet (25 mg total) by mouth 2 (two) times daily with a meal. 08/30/17   Alisa Graff, FNP  Cholecalciferol (VITAMIN D) 2000 units tablet Take 2,000 Units by mouth daily.    [provider]  furosemide (LASIX) 40 MG tablet Take 40 mg by mouth.    [provider]  hydrALAZINE (APRESOLINE) 25 MG tablet Take 1 tablet (25 mg total) by mouth 3 (three) times daily. 08/05/17 12/29/17  Alisa Graff, FNP  insulin aspart  protamine- aspart (NOVOLOG MIX 70/30) (70-30) 100 UNIT/ML injection Inject 0.35 mLs (35 Units total) into the skin 2 (two) times daily with a meal. Patient not taking: Reported on 02/22/2018 01/10/15   Gladstone Lighter, MD  nitroGLYCERIN (NITROSTAT) 0.4 MG SL tablet Place 1 tablet (0.4 mg total) under the tongue every 5 (five) minutes x 3 doses as needed for chest pain. 12/19/14   Charolette Forward, MD  omeprazole (PRILOSEC) 20 MG capsule Take 1 capsule (20 mg total) by mouth daily. 01/10/15   Gladstone Lighter, MD  ONE TOUCH ULTRA TEST test strip  11/25/17   [provider]  prasugrel (EFFIENT) 10 MG TABS tablet Take 1 tablet (10 mg total) by mouth daily. 01/10/15   Gladstone Lighter, MD     Allergies Patient has no known allergies.   Family History  Problem Relation Age of Onset  . Cancer Mother        Lung  . Cancer Father        Lung  . Diabetes Sister   . Diabetes Brother   .  CAD Brother   . Hernia Son   . Cancer Brother     Social History Social History   Tobacco Use  . Smoking status: Former Smoker    Packs/day: 0.00    Years: 30.00    Pack years: 0.00    Types: Cigarettes    Start date: 08/31/1984    Last attempt to quit: 11/30/2014    Years since quitting: 3.2  . Smokeless tobacco: Never Used  Substance Use Topics  . Alcohol use: Yes    Comment: Rarely, twice a year.  . Drug use: No    Review of Systems  Constitutional:   No fever or chills.  ENT:   No sore throat.  Positive clear rhinorrhea. Cardiovascular:   No chest pain or syncope. Respiratory:   Positive intermittent shortness of breath and nonproductive cough. Gastrointestinal:   Negative for abdominal pain, vomiting and diarrhea.  Musculoskeletal:   Negative for focal pain or swelling All other systems reviewed and are negative except as documented above in ROS and HPI.  ____________________________________________   PHYSICAL EXAM:  VITAL SIGNS: ED Triage Vitals  Enc Vitals Group     BP  03/09/18 0843 (!) 142/61     Pulse Rate 03/09/18 0843 72     Resp 03/09/18 0843 16     Temp 03/09/18 0843 98.1 F (36.7 C)     Temp Source 03/09/18 0843 Oral     SpO2 03/09/18 0843 96 %     Weight 03/09/18 0844 239 lb (108.4 kg)     Height 03/09/18 0844 5\' 9"  (1.753 m)     Head Circumference --      Peak Flow --      Pain Score 03/09/18 0843 2     Pain Loc --      Pain Edu? --      Excl. in [place]? --     Vital signs reviewed, nursing assessments reviewed.   Constitutional:   Alert and oriented. Non-toxic appearance. Eyes:   Conjunctivae are normal. EOMI. PERRL. ENT      Head:   Normocephalic and atraumatic.      Nose:   Positive nasal congestion      Mouth/Throat:   MMM, no pharyngeal erythema. No peritonsillar mass.       Neck:   No meningismus. Full ROM. Hematological/Lymphatic/Immunilogical:   No cervical lymphadenopathy. Cardiovascular:   RRR. Symmetric bilateral radial and DP pulses.  No murmurs.  Respiratory:   Normal respiratory effort without tachypnea/retractions. Breath sounds are clear and equal bilaterally. No wheezes/rales/rhonchi. Gastrointestinal:   Soft and nontender. Non distended. There is no CVA tenderness.  No rebound, rigidity, or guarding.  PD catheter in place Musculoskeletal:   Normal range of motion in all extremities. No joint effusions.  No lower extremity tenderness.  No edema. Neurologic:   Normal speech and language.  Motor grossly intact. No acute focal neurologic deficits are appreciated.  Skin:    Skin is warm, dry and intact. No rash noted.  No petechiae, purpura, or bullae.  ____________________________________________    LABS (pertinent positives/negatives) (all labs ordered are listed, but only abnormal results are displayed) Labs Reviewed  BASIC METABOLIC PANEL - Abnormal; Notable for the following components:      Result Value   Glucose, Bld 147 (*)    BUN 49 (*)    Creatinine, Ser 6.91 (*)    Calcium 7.4 (*)    GFR calc non Af  Amer 8 (*)    GFR  calc Af Amer 9 (*)    All other components within normal limits  CBC WITH DIFFERENTIAL/PLATELET - Abnormal; Notable for the following components:   WBC 12.3 (*)    RBC 3.36 (*)    Hemoglobin 10.6 (*)    HCT 31.6 (*)    RDW 15.2 (*)    Neutro Abs 10.2 (*)    Lymphs Abs 0.7 (*)    Monocytes Absolute 1.2 (*)    All other components within normal limits  TROPONIN I   ____________________________________________   EKG  Interpreted by me Sinus rhythm rate of 73, normal axis intervals ST segments and T waves.  Right bundle branch block.  ____________________________________________    RADIOLOGY  Dg Chest 2 View  Result Date: 03/09/2018 CLINICAL DATA:  Cough and shortness of breath EXAM: CHEST - 2 VIEW COMPARISON:  February 10, 2017 FINDINGS: The interstitium is mildly prominent; suspect mild interstitial edema. There is no airspace consolidation. There is cardiomegaly with mild pulmonary venous hypertension. No adenopathy. No bone lesions. IMPRESSION: Pulmonary vascular congestion. Interstitial prominent with probable mild interstitial edema. No consolidation. No adenopathy evident. Electronically Signed   By: Lowella Grip III M.D.   On: 03/09/2018 09:34    ____________________________________________   PROCEDURES Procedures  ____________________________________________    CLINICAL IMPRESSION / ASSESSMENT AND PLAN / ED COURSE  Pertinent labs & imaging results that were available during my care of the patient were reviewed by me and considered in my medical decision making (see chart for details).    Patient well-appearing and nontoxic.  Vital signs are unremarkable.  Seasonal allergies versus viral syndrome.  Low suspicion for ACS PE dissection AAA pneumothorax or pneumonia.  I will check a chest x-ray and labs given his underlying comorbidities which are severe.  Gentle IV fluids for hydration as he does report that he is under his dry weight.  Clinical  Course as of Mar 09 1009  Wed Mar 09, 2018  0943 Patient having oxygen desaturation to the low 80s, requiring nasal cannula oxygen.   [PS]  3149 Not improved with gentle fluid challenge, in fact respiratory status has worsened, worsened hypoxia and shortness of breath.  Symptoms appear to be due to a CHF exacerbation with pulmonary edema.  I will give IV Lasix, plan to admit for further diuresis.  No evidence of pneumonia or pneumothorax.  Not in respiratory distress at this time.   [PS]    Clinical Course User Index [PS] Carrie Mew, MD     ----------------------------------------- 10:10 AM on 03/09/2018 -----------------------------------------  Chest x-ray shows pulmonary edema.  Case discussed with the hospitalist for further management.  ____________________________________________   FINAL CLINICAL IMPRESSION(S) / ED DIAGNOSES    Final diagnoses:  Acute on chronic systolic congestive heart failure ([place])  Acute respiratory failure with hypoxia [facility])     ED Discharge Orders    None      Portions of this note were generated with dragon dictation software. Dictation errors may occur despite best attempts at proofreading.    Carrie Mew, MD 03/09/18 [place], MD 03/09/18 1041

## 2018-03-09 NOTE — H&P (Signed)
[facility] NAME: Scott [name]    MR#:  [id number]  DATE OF BIRTH:  1961-01-04  DATE OF ADMISSION:  03/09/2018  PRIMARY CARE PHYSICIAN: [facility]   REQUESTING/REFERRING PHYSICIAN: Dr. Brenton Grills  CHIEF COMPLAINT:   Chief Complaint  Patient presents with  . Abdominal Pain  . Cough  . Nausea    HISTORY OF PRESENT ILLNESS:  Scott [name]  is a 57 y.o. male with a known history of end-stage renal disease on peritoneal dialysis, chronic diastolic CHF, peripheral vascular disease, osteoarthritis, anxiety/depression, previous history of MI, GERD, diabetes who presents to the hospital due to cough, shortness of breath and abdominal pain.  Patient says he developed the symptoms about a week ago and have progressively gotten worse.  He says he is been having difficulty breathing at night when he tries to sleep.  He has to prop himself up to sleep.  He also gets up in the middle night gasping for air and feeling short of breath.  He denies any fevers, chills.  Admits to some nausea but no acute vomiting.  He denies any recent sick contacts.  Because his symptoms are getting worse he came to the hospital for further evaluation.  Patient was noted to be hypoxic and his chest x-ray findings suggestive of pulmonary vascular congestion and volume overload.  Hospitalist services were contacted for treatment evaluation.  Patient denies increasing salt intake, he admits to doing his peritoneal dialysis exchanges and has been tolerating them well.  Denies any significant weight gain.  PAST MEDICAL HISTORY:   Past Medical History:  Diagnosis Date  . Arthritis    "left arm; right leg" (12/13/2014)  . Asthma   . CHF (congestive heart failure) ([place])   . Chronic disease anemia    [name] 12/13/2014  . Chronic kidney disease (CKD), stage IV (severe) ([place])    [name] 12/13/2014... on dialysis  . Complication of anesthesia    unable to  urinate after CAPD urgery  . Continuous ambulatory peritoneal dialysis status ([place])   . Coronary artery disease    [name] 12/13/2014  . Depression   . Dysrhythmia    patient unaware of irregular heartbeat  . GERD (gastroesophageal reflux disease)   . High cholesterol    [name] 12/13/2014  . Hypertension   . Non-Q wave myocardial infarction ([place])    [name] 12/13/2014  . PVD (peripheral vascular disease) ([place])    [name] 12/13/2014  . Type II diabetes mellitus ([place])     PAST SURGICAL HISTORY:   Past Surgical History:  Procedure Laterality Date  . AV FISTULA PLACEMENT Right 04/07/2017   Procedure: ARTERIOVENOUS (AV) FISTULA CREATION ( BRACHIALCEPHALIC );  Surgeon: Katha Cabal, MD;  Location: [facility];  Service: Vascular;  Laterality: Right;  . CAPD INSERTION N/A 04/07/2017   Procedure: LAPAROSCOPIC INSERTION CONTINUOUS AMBULATORY PERITONEAL DIALYSIS  (CAPD) CATHETER;  Surgeon: Katha Cabal, MD;  Location: [facility];  Service: Vascular;  Laterality: N/A;  . CARDIAC CATHETERIZATION  11/2014  . COLONOSCOPY WITH PROPOFOL N/A 12/01/2017   Procedure: COLONOSCOPY WITH PROPOFOL;  Surgeon: Lin Landsman, MD;  Location: [facility];  Service: Endoscopy;  Laterality: N/A;  Diabetic - insulin  . COLONOSCOPY WITH PROPOFOL N/A 12/29/2017   Procedure: COLONOSCOPY WITH PROPOFOL;  Surgeon: Lin Landsman, MD;  Location: [facility];  Service: Endoscopy;  Laterality: N/A;  . DIALYSIS/PERMA CATHETER INSERTION N/A 01/18/2017   Procedure: Dialysis/Perma Catheter Insertion;  Surgeon: Algernon Huxley, MD;  Location: [facility];  Service: Cardiovascular;  Laterality: N/A;  . DIALYSIS/PERMA CATHETER REMOVAL N/A 07/26/2017   Procedure: DIALYSIS/PERMA CATHETER REMOVAL;  Surgeon: Algernon Huxley, MD;  Location: [facility];  Service: Cardiovascular;  Laterality: N/A;  . INCISION AND DRAINAGE OF WOUND Right ~ 10/2014   "4th toe foot"  . PERCUTANEOUS CORONARY STENT  INTERVENTION (PCI-S) N/A 12/14/2014   Procedure: PERCUTANEOUS CORONARY STENT INTERVENTION (PCI-S);  Surgeon: Charolette Forward, MD;  Location: [facility];  Service: Cardiovascular;  Laterality: N/A;  . PERCUTANEOUS CORONARY STENT INTERVENTION (PCI-S) N/A 12/18/2014   Procedure: PERCUTANEOUS CORONARY STENT INTERVENTION (PCI-S);  Surgeon: Charolette Forward, MD;  Location: [facility] CATH LAB;  Service: Cardiovascular;  Laterality: N/A;  . POLYPECTOMY  12/01/2017   Procedure: POLYPECTOMY;  Surgeon: Lin Landsman, MD;  Location: [facility] [place];  Service: Endoscopy;;  . POLYPECTOMY  12/29/2017   Procedure: POLYPECTOMY;  Surgeon: Lin Landsman, MD;  Location: [facility];  Service: Endoscopy;;  . TOE AMPUTATION Right 2015   4th toe    SOCIAL HISTORY:   Social History   Tobacco Use  . Smoking status: Former Smoker    Packs/day: 1.00    Years: 30.00    Pack years: 30.00    Types: Cigarettes    Start date: 08/31/1984    Last attempt to quit: 11/30/2014    Years since quitting: 3.2  . Smokeless tobacco: Never Used  Substance Use Topics  . Alcohol use: Yes    Comment: Rarely, twice a year.    FAMILY HISTORY:   Family History  Problem Relation Age of Onset  . Cancer Mother        Lung  . Cancer Father        Lung  . Diabetes Sister   . Diabetes Brother   . CAD Brother   . Hernia Son   . Cancer Brother     DRUG ALLERGIES:  No Known Allergies  REVIEW OF SYSTEMS:   Review of Systems  Constitutional: Negative for fever and weight loss.  HENT: Negative for congestion, nosebleeds and tinnitus.   Eyes: Negative for blurred vision, double vision and redness.  Respiratory: Positive for cough and shortness of breath. Negative for hemoptysis.   Cardiovascular: Negative for chest pain, orthopnea, leg swelling and PND.  Gastrointestinal: Positive for abdominal pain. Negative for diarrhea, melena and vomiting.  Genitourinary: Negative for dysuria, hematuria and urgency.   Musculoskeletal: Negative for falls and joint pain.  Neurological: Negative for dizziness, tingling, sensory change, focal weakness, seizures, weakness and headaches.  Endo/Heme/Allergies: Negative for polydipsia. Does not bruise/bleed easily.  Psychiatric/Behavioral: Negative for depression and memory loss. The patient is not nervous/anxious.     MEDICATIONS AT HOME:   Prior to Admission medications   Medication Sig Start Date End Date Taking? Authorizing Provider  amLODipine (NORVASC) 10 MG tablet Take 10 mg by mouth daily.   Yes [provider]  aspirin EC 81 MG tablet Take 1 tablet (81 mg total) by mouth daily. 01/10/15  Yes Gladstone Lighter, MD  B Complex-C-Folic Acid (DIALYVITE 665) 0.8 MG TABS Take 1 tablet by mouth daily. 10/25/17  Yes [provider]  calcitRIOL (ROCALTROL) 0.5 MCG capsule Take 1 capsule by mouth daily. 02/28/18  Yes [provider]  calcium acetate (PHOSLO) 667 MG capsule Take 2,001 mg by mouth 3 (three) times daily with meals.  06/08/17  Yes [provider]  carvedilol (COREG) 25 MG  tablet Take 1 tablet (25 mg total) by mouth 2 (two) times daily with a meal. 08/30/17  Yes Darylene Price A, FNP  Cholecalciferol (VITAMIN D) 2000 units tablet Take 2,000 Units by mouth daily.   Yes [provider]  furosemide (LASIX) 40 MG tablet Take 40 mg by mouth daily.    Yes [provider]  gentamicin cream (GARAMYCIN) 0.1 % Apply 1 application topically daily. 03/02/18  Yes [provider]  albuterol (PROVENTIL HFA;VENTOLIN HFA) 108 (90 BASE) MCG/ACT inhaler Inhale 2 puffs into the lungs every 6 (six) hours as needed for wheezing or shortness of breath. 01/10/15   Gladstone Lighter, MD  atorvastatin (LIPITOR) 80 MG tablet Take 1 tablet (80 mg total) by mouth daily at 6 PM. Patient not taking: Reported on 03/09/2018 01/10/15   Gladstone Lighter, MD  hydrALAZINE (APRESOLINE) 25 MG tablet Take 1 tablet (25 mg total) by mouth 3  (three) times daily. 08/05/17 12/29/17  Alisa Graff, FNP  insulin aspart protamine- aspart (NOVOLOG MIX 70/30) (70-30) 100 UNIT/ML injection Inject 0.35 mLs (35 Units total) into the skin 2 (two) times daily with a meal. Patient not taking: Reported on 02/22/2018 01/10/15   Gladstone Lighter, MD  nitroGLYCERIN (NITROSTAT) 0.4 MG SL tablet Place 1 tablet (0.4 mg total) under the tongue every 5 (five) minutes x 3 doses as needed for chest pain. 12/19/14   Charolette Forward, MD  omeprazole (PRILOSEC) 20 MG capsule Take 1 capsule (20 mg total) by mouth daily. Patient not taking: Reported on 03/09/2018 01/10/15   Gladstone Lighter, MD  ONE [facility] ULTRA TEST test strip  11/25/17   [provider]  prasugrel (EFFIENT) 10 MG TABS tablet Take 1 tablet (10 mg total) by mouth daily. Patient not taking: Reported on 03/09/2018 01/10/15   Gladstone Lighter, MD      VITAL SIGNS:  Blood pressure 137/78, pulse 70, temperature 98.1 F (36.7 C), temperature source Oral, resp. rate 18, height 5\' 9"  (1.753 m), weight 108.4 kg (239 lb), SpO2 96 %.  PHYSICAL EXAMINATION:  Physical Exam  GENERAL:  57 y.o.-year-old patient lying in the bed with no acute distress.  EYES: Pupils equal, round, reactive to light and accommodation. No scleral icterus. Extraocular muscles intact.  HEENT: Head atraumatic, normocephalic. Oropharynx and nasopharynx clear. No oropharyngeal erythema, moist oral mucosa  NECK:  Supple, no jugular venous distention. No thyroid enlargement, no tenderness.  LUNGS: Normal breath sounds bilaterally, no wheezing, rales, rhonchi. No use of accessory muscles of respiration.  CARDIOVASCULAR: S1, S2 RRR. No murmurs, rubs, gallops, clicks.  ABDOMEN: Soft, nontender, nondistended. Bowel sounds present. No organomegaly or mass.  Positive peritoneal dialysis catheter in place and no infection or drainage noted. EXTREMITIES: No pedal edema, cyanosis, or clubbing. + 2 pedal & radial pulses b/l.   NEUROLOGIC:  Cranial nerves II through XII are intact. No focal Motor or sensory deficits appreciated b/l PSYCHIATRIC: The patient is alert and oriented x 3.  SKIN: No obvious rash, lesion, or ulcer.   LABORATORY PANEL:   CBC Recent Labs  Lab 03/09/18 0903  WBC 12.3*  HGB 10.6*  HCT 31.6*  PLT 248   ------------------------------------------------------------------------------------------------------------------  Chemistries  Recent Labs  Lab 03/09/18 0903  NA 138  K 4.6  CL 106  CO2 22  GLUCOSE 147*  BUN 49*  CREATININE 6.91*  CALCIUM 7.4*   ------------------------------------------------------------------------------------------------------------------  Cardiac Enzymes Recent Labs  Lab 03/09/18 0903  TROPONINI <0.03   ------------------------------------------------------------------------------------------------------------------  RADIOLOGY:  Dg Chest 2 View  Result Date: 03/09/2018 CLINICAL DATA:  Cough and shortness of breath EXAM: CHEST - 2 VIEW COMPARISON:  February 10, 2017 FINDINGS: The interstitium is mildly prominent; suspect mild interstitial edema. There is no airspace consolidation. There is cardiomegaly with mild pulmonary venous hypertension. No adenopathy. No bone lesions. IMPRESSION: Pulmonary vascular congestion. Interstitial prominent with probable mild interstitial edema. No consolidation. No adenopathy evident. Electronically Signed   By: Lowella Grip III M.D.   On: 03/09/2018 09:34     IMPRESSION AND PLAN:   57 year old male with past medical history of end-stage renal disease on peritoneal dialysis, chronic diastolic CHF, peripheral vascular disease, previous history of MI, diabetes, hypertension, hyperlipidemia who presents to the hospital due to cough, shortness of breath and abdominal pain.  1.  Acute respiratory failure with hypoxia-secondary to volume overload and CHF.  Patient was hypoxic and his chest x-ray findings were suggestive of pulmonary  vascular congestion and edema. -Patient does make urine and therefore will aggressively diuresis with IV Lasix. - Wean off oxygen as tolerated.  2.  CHF-acute on chronic diastolic dysfunction.  We will diurese the patient with IV Lasix, follow I's and O's and daily weights. -We will also continue peritoneal dialysis exchanges. -Continue carvedilol, hydralazine.  3.  End-stage renal disease on peritoneal dialysis- we will consult nephrology, continue dialysis exchanges as per them.  4.  Secondary hyperparathyroidism- continue PhosLo, Rocaltrol.  5.  Essential hypertension-continue Norvasc, carvedilol, hydralazine.  6.  GERD-continue Protonix.  7.  Diabetes type 2 without complication- will place on SSI for now.  - follow BS.      All the records are reviewed and case discussed with ED provider. Management plans discussed with the patient, family and they are in agreement.  CODE STATUS: Full code  TOTAL TIME TAKING CARE OF THIS PATIENT: 40 minutes.    Henreitta Leber M.D on 03/09/2018 at 10:57 AM  Between 7am to 6pm - Pager - [phone]  After 6pm go to [email or web address] - password [email or web address] [facility]  [facility]  Office  [phone]  CC: Primary care physician; [facility] [employer]

## 2018-03-09 NOTE — ED Notes (Signed)
Admitting MD at bedside.

## 2018-03-10 LAB — GLUCOSE, CAPILLARY
GLUCOSE-CAPILLARY: 116 mg/dL — AB (ref 70–99)
GLUCOSE-CAPILLARY: 207 mg/dL — AB (ref 70–99)
Glucose-Capillary: 122 mg/dL — ABNORMAL HIGH (ref 70–99)
Glucose-Capillary: 249 mg/dL — ABNORMAL HIGH (ref 70–99)

## 2018-03-10 LAB — BASIC METABOLIC PANEL
Anion gap: 10 (ref 5–15)
BUN: 50 mg/dL — AB (ref 6–20)
CHLORIDE: 107 mmol/L (ref 98–111)
CO2: 23 mmol/L (ref 22–32)
CREATININE: 7.13 mg/dL — AB (ref 0.61–1.24)
Calcium: 7.6 mg/dL — ABNORMAL LOW (ref 8.9–10.3)
GFR calc Af Amer: 9 mL/min — ABNORMAL LOW (ref >60)
GFR calc non Af Amer: 7 mL/min — ABNORMAL LOW (ref >60)
GLUCOSE: 174 mg/dL — AB (ref 70–99)
Potassium: 4.4 mmol/L (ref 3.5–5.1)
Sodium: 140 mmol/L (ref 135–145)

## 2018-03-10 LAB — CBC
HEMATOCRIT: 32 % — AB (ref 40.0–52.0)
Hemoglobin: 10.6 g/dL — ABNORMAL LOW (ref 13.0–18.0)
MCH: 31.3 pg (ref 26.0–34.0)
MCHC: 33.1 g/dL (ref 32.0–36.0)
MCV: 94.6 fL (ref 80.0–100.0)
Platelets: 257 10*3/uL (ref 150–440)
RBC: 3.39 MIL/uL — ABNORMAL LOW (ref 4.40–5.90)
RDW: 15.4 % — AB (ref 11.5–14.5)
WBC: 11.5 10*3/uL — ABNORMAL HIGH (ref 3.8–10.6)

## 2018-03-10 MED ORDER — CARBAMIDE PEROXIDE 6.5 % OT SOLN
5.0000 [drp] | Freq: Two times a day (BID) | OTIC | Status: DC
  Administered 2018-03-10 – 2018-03-11 (×4): 5 [drp] via OTIC
  Filled 2018-03-10: qty 15

## 2018-03-10 MED ORDER — FLUTICASONE PROPIONATE 50 MCG/ACT NA SUSP
2.0000 | Freq: Every day | NASAL | Status: DC
  Administered 2018-03-10 – 2018-03-11 (×2): 2 via NASAL
  Filled 2018-03-10: qty 16

## 2018-03-10 MED ORDER — LORATADINE 10 MG PO TABS
10.0000 mg | ORAL_TABLET | Freq: Every day | ORAL | Status: DC
  Administered 2018-03-10 – 2018-03-11 (×2): 10 mg via ORAL
  Filled 2018-03-10 (×2): qty 1

## 2018-03-10 MED ORDER — OXYCODONE-ACETAMINOPHEN 5-325 MG PO TABS
1.0000 | ORAL_TABLET | Freq: Four times a day (QID) | ORAL | Status: DC | PRN
  Administered 2018-03-10: 1 via ORAL
  Filled 2018-03-10: qty 1

## 2018-03-10 MED ORDER — METHYLPREDNISOLONE SODIUM SUCC 40 MG IJ SOLR
40.0000 mg | Freq: Once | INTRAMUSCULAR | Status: AC
  Administered 2018-03-10: 40 mg via INTRAVENOUS
  Filled 2018-03-10: qty 1

## 2018-03-10 MED ORDER — FUROSEMIDE 40 MG PO TABS: 40.0000 mg | ORAL_TABLET | Freq: Every day | ORAL | Status: DC

## 2018-03-10 MED ORDER — ALBUTEROL SULFATE (2.5 MG/3ML) 0.083% IN NEBU
2.5000 mg | INHALATION_SOLUTION | Freq: Four times a day (QID) | RESPIRATORY_TRACT | Status: DC
  Administered 2018-03-10 – 2018-03-12 (×6): 2.5 mg via RESPIRATORY_TRACT
  Filled 2018-03-10 (×7): qty 3

## 2018-03-10 NOTE — Progress Notes (Signed)
Patient ID: Scott [name], male   DOB: Aug 29, 1961, 57 y.o.   MRN: [id number]  [facility] PROGRESS NOTE  Scott [name] QQV:[id number] DOB: 1960-10-23 DOA: 03/09/2018 PCP: [facility] [employer]  HPI/Subjective: Patient having quite a bit of fluid taken off yesterday and had pains.  He is scared to have these pains again.  Still little short of breath.  States he has been losing weight and he is less than his dry weight.  Positive for allergies.  Objective: Vitals:   03/10/18 1401 03/10/18 1527  BP:  126/65  Pulse:  68  Resp:  18  Temp:  99.4 F (37.4 C)  SpO2: 91% 94%    Filed Weights   03/09/18 0844 03/09/18 1223 03/10/18 0535  Weight: 108.4 kg (239 lb) 102.8 kg (226 lb 9.6 oz) 100.6 kg (221 lb 12.8 oz)    ROS: Review of Systems  Constitutional: Negative for chills and fever.  Eyes: Negative for blurred vision.  Respiratory: Positive for cough and shortness of breath.   Cardiovascular: Negative for chest pain.  Gastrointestinal: Negative for abdominal pain, constipation, diarrhea, nausea and vomiting.  Genitourinary: Negative for dysuria.  Musculoskeletal: Negative for joint pain.  Neurological: Negative for dizziness and headaches.   Exam: Physical Exam  Constitutional: He is oriented to person, place, and time.  HENT:  Nose: No mucosal edema.  Mouth/Throat: No oropharyngeal exudate or posterior oropharyngeal edema.  Eyes: Pupils are equal, round, and reactive to light. Conjunctivae, EOM and lids are normal.  Neck: No JVD present. Carotid bruit is not present. No edema present. No thyroid mass and no thyromegaly present.  Cardiovascular: S1 normal and S2 normal. Exam reveals no gallop.  No murmur heard. Pulses:      Dorsalis pedis pulses are 2+ on the right side, and 2+ on the left side.  Respiratory: No respiratory distress. He has decreased breath sounds in the right lower field and the left lower field. He has no wheezes. He has rhonchi in the right  lower field and the left lower field. He has no rales.  GI: Soft. Bowel sounds are normal. There is no tenderness.  Musculoskeletal:       Right ankle: He exhibits swelling.       Left ankle: He exhibits swelling.  Lymphadenopathy:    He has no cervical adenopathy.  Neurological: He is alert and oriented to person, place, and time. No cranial nerve deficit.  Skin: Skin is warm. No rash noted. Nails show no clubbing.  Psychiatric: He has a normal mood and affect.      Data Reviewed: Basic Metabolic Panel: Recent Labs  Lab 03/09/18 0903 03/10/18 0620  NA 138 140  K 4.6 4.4  CL 106 107  CO2 22 23  GLUCOSE 147* 174*  BUN 49* 50*  CREATININE 6.91* 7.13*  CALCIUM 7.4* 7.6*   CBC: Recent Labs  Lab 03/09/18 0903 03/10/18 0620  WBC 12.3* 11.5*  NEUTROABS 10.2*  --   HGB 10.6* 10.6*  HCT 31.6* 32.0*  MCV 94.2 94.6  PLT 248 257   Cardiac Enzymes: Recent Labs  Lab 03/09/18 0903  TROPONINI <0.03    CBG: Recent Labs  Lab 03/09/18 1224 03/09/18 1633 03/09/18 2053 03/10/18 0810 03/10/18 1141  GLUCAP 120* 118* 130* 116* 122*    Recent Results (from the past 240 hour(s))  MRSA PCR Screening     Status: None   Collection Time: 03/09/18  2:32 PM  Result Value Ref Range Status  MRSA by PCR NEGATIVE NEGATIVE Final    Comment:        The GeneXpert MRSA Assay (FDA approved for NASAL specimens only), is one component of a comprehensive MRSA colonization surveillance program. It is not intended to diagnose MRSA infection nor to guide or monitor treatment for MRSA infections. Performed at [facility], [address] [place], [place] [address]      Studies: Dg Chest 2 View  Result Date: 03/09/2018 CLINICAL DATA:  Cough and shortness of breath EXAM: CHEST - 2 VIEW COMPARISON:  February 10, 2017 FINDINGS: The interstitium is mildly prominent; suspect mild interstitial edema. There is no airspace consolidation. There is cardiomegaly with mild pulmonary  venous hypertension. No adenopathy. No bone lesions. IMPRESSION: Pulmonary vascular congestion. Interstitial prominent with probable mild interstitial edema. No consolidation. No adenopathy evident. Electronically Signed   By: Lowella Grip III M.D.   On: 03/09/2018 09:34    Scheduled Meds: . albuterol  2.5 mg Nebulization Q6H  . amLODipine  10 mg Oral Daily  . aspirin EC  81 mg Oral Daily  . calcitRIOL  0.5 mcg Oral Daily  . calcium acetate  2,001 mg Oral TID WC  . carbamide peroxide  5 drop Left EAR BID  . carvedilol  25 mg Oral BID WC  . cholecalciferol  2,000 Units Oral Daily  . fluticasone  2 spray Each Nare Daily  . furosemide  80 mg Intravenous Daily  . gentamicin cream  1 application Topical Daily  . heparin  5,000 Units Subcutaneous Q8H  . hydrALAZINE  25 mg Oral TID  . insulin aspart  0-5 Units Subcutaneous QHS  . insulin aspart  0-9 Units Subcutaneous TID WC  . loratadine  10 mg Oral Daily  . multivitamin  1 tablet Oral Daily   Continuous Infusions:  Assessment/Plan:  1. Acute hypoxic respiratory failure secondary to volume overload and acute diastolic congestive heart failure.  Patient was given Lasix and had peritoneal dialysis.  Will have to check pulse ox on a daily basis to see if we can get him off the oxygen. 2. Acute on chronic diastolic congestive heart failure.  Patient on Lasix and dialysis to manage fluid.  Patient on Coreg and hydralazine. 3. End-stage renal disease on peritoneal dialysis 4. Essential hypertension on Norvasc, Coreg and hydralazine.  Patient slightly orthostatic switch Lasix over to p.o. 5. GERD on Protonix 6. Allergies and shortness of breath give 1 dose of steroids and Claritin and Flonase and nebulizer treatments 7. Type 2 diabetes mellitus on sliding scale insulin  Code Status:     Code Status Orders  (From admission, onward)        Start     Ordered   03/09/18 1206  Full code  Continuous     03/09/18 1205    Code Status  History    Date Active Date Inactive Code Status Order ID Comments User Context   01/17/2017 0338 01/20/2017 1834 Full Code [id number]  Harrie Foreman, MD ED   11/13/2016 1013 11/20/2016 1256 Full Code [id number]  Hillary Bow, MD ED   01/09/2015 1019 01/10/2015 1518 Full Code [id number]  Max Sane, MD Inpatient   12/18/2014 1620 12/19/2014 1627 Full Code [id number]  Charolette Forward, MD Inpatient   12/14/2014 1600 12/18/2014 1620 Full Code [id number]  Charolette Forward, MD Inpatient   12/13/2014 1324 12/14/2014 1600 Full Code [id number]  Charolette Forward, MD Inpatient     Family Communication: Wife at bedside Disposition Plan: Hopefully home  tomorrow  Consultants:  Nephrology  Time spent: 28 minutes  [place]

## 2018-03-10 NOTE — Progress Notes (Signed)
Pre-PD assessment. Pt c/o abdominal pain with dialysis last night, MD changed dialysate fluid to 2.5% for tonight's tx.    03/10/18 1850  Cycler Setup  Total Number of Exchanges 4  Fill Volume 1700  Dianeal Solution Dextrose 2.5% in 6000 mL  Last Fill Volume 0  Fill Time - Minute(s) 10  Drain Time - Minute(s) 20 mins  Completion  Weight after Drain 219 lb 12.8 oz (99.7 kg)  Exit Site Care Performed Yes  Fluid Balance - CCPD  Total Intake for Exchanges (mL) 6800 ml  Procedure Comments  Tolerated treatment well? Yes  Education / Care Plan  Dialysis Education Provided Yes  Documented Education in Care Plan Yes  Hand-Off documentation  Report given to (Full Name) Stark Bray  Report received from (Full Name) Milana Obey

## 2018-03-10 NOTE — Plan of Care (Signed)
  Problem: Education: Goal: Knowledge of General Education information will improve Outcome: Progressing   Problem: Health Behavior/Discharge Planning: Goal: Ability to manage health-related needs will improve Outcome: Progressing   Problem: Clinical Measurements: Goal: Will remain free from infection Outcome: Progressing   

## 2018-03-10 NOTE — Progress Notes (Addendum)
Patient complaining of stiffness and pain from dialysis treatment. States " yall are pulling too much fluid off at once, feels like I got hit with a bat in my ribs". Requested procedure to be stopped. Stopped machine with assistance from technical support. Patient disconnected self from machine.  MD notified. Clamps closed. Patient will remain attached until dialysis RN returns. Medicated for pain. Will monitor.

## 2018-03-10 NOTE — Progress Notes (Signed)
[facility]  ROUNDING NOTE   Subjective:  Patient tolerated CCPD quite well overnight. Ultrafiltration achieved was 3.7 kg. Still having some shortness of breath however. Has mild lower extremity edema.   Objective:  Vital signs in last 24 hours:  Temp:  [97.9 F (36.6 C)-99.6 F (37.6 C)] 98.5 F (36.9 C) (07/11 0829) Pulse Rate:  [65-75] 66 (07/11 1138) Resp:  [18] 18 (07/11 1138) BP: (111-149)/(56-80) 111/57 (07/11 1138) SpO2:  [86 %-99 %] 91 % (07/11 1401) Weight:  [100.6 kg (221 lb 12.8 oz)] 100.6 kg (221 lb 12.8 oz) (07/11 0535)  Weight change:  Filed Weights   03/09/18 0844 03/09/18 1223 03/10/18 0535  Weight: 108.4 kg (239 lb) 102.8 kg (226 lb 9.6 oz) 100.6 kg (221 lb 12.8 oz)    Intake/Output: I/O last 3 completed shifts: In: 2703 [P.O.:240; Other:6800; IV Piggyback:500] Out: 350 [Urine:350]   Intake/Output this shift:  Total I/O In: 240 [P.O.:240] Out: -   Physical Exam: General: No acute distress  Head: Normocephalic, atraumatic. Moist oral mucosal membranes  Eyes: Anicteric  Neck: Supple, trachea midline  Lungs:  Decreased air movement, normal effort  Heart: S1S2 no rubs  Abdomen:  Soft, nontender, bowel sounds present  Extremities: 1+ peripheral edema.  Neurologic: Awake, alert, following commands  Skin: No lesions  Access: Peritoneal dialysis catheter in place    Basic Metabolic Panel: Recent Labs  Lab 03/09/18 0903 03/10/18 0620  NA 138 140  K 4.6 4.4  CL 106 107  CO2 22 23  GLUCOSE 147* 174*  BUN 49* 50*  CREATININE 6.91* 7.13*  CALCIUM 7.4* 7.6*    Liver Function Tests: No results for input(s): AST, ALT, ALKPHOS, BILITOT, PROT, ALBUMIN in the last 168 hours. No results for input(s): LIPASE, AMYLASE in the last 168 hours. No results for input(s): AMMONIA in the last 168 hours.  CBC: Recent Labs  Lab 03/09/18 0903 03/10/18 0620  WBC 12.3* 11.5*  NEUTROABS 10.2*  --   HGB 10.6* 10.6*  HCT 31.6* 32.0*  MCV  94.2 94.6  PLT 248 257    Cardiac Enzymes: Recent Labs  Lab 03/09/18 0903  TROPONINI <0.03    BNP: Invalid input(s): POCBNP  CBG: Recent Labs  Lab 03/09/18 1224 03/09/18 1633 03/09/18 2053 03/10/18 0810 03/10/18 1141  GLUCAP 120* 118* 130* 116* 122*    Microbiology: Results for orders placed or performed during the hospital encounter of 03/09/18  MRSA PCR Screening     Status: None   Collection Time: 03/09/18  2:32 PM  Result Value Ref Range Status   MRSA by PCR NEGATIVE NEGATIVE Final    Comment:        The GeneXpert MRSA Assay (FDA approved for NASAL specimens only), is one component of a comprehensive MRSA colonization surveillance program. It is not intended to diagnose MRSA infection nor to guide or monitor treatment for MRSA infections. Performed at [facility], [address] [place], [place] [address]     Coagulation Studies: No results for input(s): LABPROT, INR in the last 72 hours.  Urinalysis: No results for input(s): COLORURINE, LABSPEC, PHURINE, GLUCOSEU, HGBUR, BILIRUBINUR, KETONESUR, PROTEINUR, UROBILINOGEN, NITRITE, LEUKOCYTESUR in the last 72 hours.  Invalid input(s): APPERANCEUR    Imaging: Dg Chest 2 View  Result Date: 03/09/2018 CLINICAL DATA:  Cough and shortness of breath EXAM: CHEST - 2 VIEW COMPARISON:  February 10, 2017 FINDINGS: The interstitium is mildly prominent; suspect mild interstitial edema. There is no airspace consolidation. There is cardiomegaly  with mild pulmonary venous hypertension. No adenopathy. No bone lesions. IMPRESSION: Pulmonary vascular congestion. Interstitial prominent with probable mild interstitial edema. No consolidation. No adenopathy evident. Electronically Signed   By: Lowella Grip III M.D.   On: 03/09/2018 09:34     Medications:    . albuterol  2.5 mg Nebulization Q6H  . amLODipine  10 mg Oral Daily  . aspirin EC  81 mg Oral Daily  . calcitRIOL  0.5 mcg Oral Daily  . calcium  acetate  2,001 mg Oral TID WC  . carbamide peroxide  5 drop Left EAR BID  . carvedilol  25 mg Oral BID WC  . cholecalciferol  2,000 Units Oral Daily  . fluticasone  2 spray Each Nare Daily  . furosemide  80 mg Intravenous Daily  . gentamicin cream  1 application Topical Daily  . heparin  5,000 Units Subcutaneous Q8H  . hydrALAZINE  25 mg Oral TID  . insulin aspart  0-5 Units Subcutaneous QHS  . insulin aspart  0-9 Units Subcutaneous TID WC  . loratadine  10 mg Oral Daily  . multivitamin  1 tablet Oral Daily   acetaminophen **OR** acetaminophen, guaiFENesin-dextromethorphan, heparin, nitroGLYCERIN, ondansetron **OR** ondansetron (ZOFRAN) IV, oxyCODONE-acetaminophen  Assessment/ Plan:  57 y.o. male with end stage renal disease on peritoneal dialysis, congestive heart failure, GERD, hypertension, peripheral vascular disease, diabetes mellitus type 2, diabetic retinopathy, diabetic gastroparesis   [facility]/[address]   1.  ESRD on peritoneal dialysis. 2.  Acute pulmonary edema. 3.  Anemia of chronic kidney disease. 4.  Secondary hyperparathyroidism. 5.  Hypertension.  Plan: The patient experienced good ultrafiltration with peritoneal dialysis overnight.  Ultrafiltration achieved was 3.7 kg.  We will plan for peritoneal dialysis tonight using 2.5% dextrose solution as opposed to 4.25% which we used last night.  Case discussed with hospitalist.  Still having some shortness of breath but unclear as to whether it is now related to pulmonary edema.  Steroids to be added to his medication regimen.  Further plan as patient progresses.   LOS: 1 Scott [name] 7/11/20192:56 PM

## 2018-03-10 NOTE — Progress Notes (Signed)
Post PD assessment. I drain 1035 ml, UF 2750, average dwell time 1 hour 33 minutes, MD aware.    03/10/18 0905  Completion  Weight after Drain 218 lb 7.6 oz (99.1 kg)  Effluent Appearance Cloudy;Fibrin;Yellow  Treatment Status Complete  Procedure Comments  Tolerated treatment well? Yes  Education / Care Plan  Dialysis Education Provided Yes  Documented Education in Care Plan Yes  Hand-Off documentation  Report given to (Full Name) Milana Obey  Report received from (Full Name) Stark Bray

## 2018-03-11 LAB — GLUCOSE, CAPILLARY
GLUCOSE-CAPILLARY: 277 mg/dL — AB (ref 70–99)
Glucose-Capillary: 154 mg/dL — ABNORMAL HIGH (ref 70–99)
Glucose-Capillary: 133 mg/dL — ABNORMAL HIGH (ref 70–99)
Glucose-Capillary: 265 mg/dL — ABNORMAL HIGH (ref 70–99)

## 2018-03-11 LAB — HIV ANTIBODY (ROUTINE TESTING W REFLEX): HIV Screen 4th Generation wRfx: NONREACTIVE

## 2018-03-11 MED ORDER — FUROSEMIDE 10 MG/ML IJ SOLN
40.0000 mg | Freq: Once | INTRAMUSCULAR | Status: AC
  Administered 2018-03-11: 40 mg via INTRAVENOUS
  Filled 2018-03-11: qty 4

## 2018-03-11 MED ORDER — METHYLPREDNISOLONE SODIUM SUCC 40 MG IJ SOLR
40.0000 mg | Freq: Once | INTRAMUSCULAR | Status: AC
  Administered 2018-03-11: 40 mg via INTRAVENOUS
  Filled 2018-03-11: qty 1

## 2018-03-11 MED ORDER — ALUM & MAG HYDROXIDE-SIMETH 200-200-20 MG/5ML PO SUSP
15.0000 mL | Freq: Four times a day (QID) | ORAL | Status: DC | PRN
  Administered 2018-03-11: 15 mL via ORAL
  Filled 2018-03-11: qty 30

## 2018-03-11 NOTE — Progress Notes (Signed)
Pd started 

## 2018-03-11 NOTE — Care Management Important Message (Signed)
Copy of signed IM left with patient in room.  

## 2018-03-11 NOTE — Progress Notes (Signed)
[facility]  ROUNDING NOTE   Subjective:  Patient seen at bedside. Still having some shortness of breath. Ultrafiltration achieved with peritoneal dialysis was 959 cc overnight.   Objective:  Vital signs in last 24 hours:  Temp:  [98 F (36.7 C)-99.4 F (37.4 C)] 98.3 F (36.8 C) (07/12 0740) Pulse Rate:  [67-73] 68 (07/12 0956) Resp:  [18] 18 (07/12 0740) BP: (114-144)/(54-74) 122/63 (07/12 0956) SpO2:  [81 %-96 %] 94 % (07/12 0956) Weight:  [101.2 kg (223 lb 3.2 oz)] 101.2 kg (223 lb 3.2 oz) (07/12 0323)  Weight change: -7.167 kg (-15 lb 12.8 oz) Filed Weights   03/09/18 1223 03/10/18 0535 03/11/18 0323  Weight: 102.8 kg (226 lb 9.6 oz) 100.6 kg (221 lb 12.8 oz) 101.2 kg (223 lb 3.2 oz)    Intake/Output: I/O last 3 completed shifts: In: 82505 [P.O.:240; [id number]] Out: 650 [Urine:650]   Intake/Output this shift:  Total I/O In: 240 [P.O.:240] Out: 959 [Other:959]  Physical Exam: General: No acute distress  Head: Normocephalic, atraumatic. Moist oral mucosal membranes  Eyes: Anicteric  Neck: Supple, trachea midline  Lungs:  Decreased air movement, normal effort  Heart: S1S2 no rubs  Abdomen:  Soft, nontender, bowel sounds present  Extremities: 1+ peripheral edema.  Neurologic: Awake, alert, following commands  Skin: No lesions  Access: Peritoneal dialysis catheter in place    Basic Metabolic Panel: Recent Labs  Lab 03/09/18 0903 03/10/18 0620  NA 138 140  K 4.6 4.4  CL 106 107  CO2 22 23  GLUCOSE 147* 174*  BUN 49* 50*  CREATININE 6.91* 7.13*  CALCIUM 7.4* 7.6*    Liver Function Tests: No results for input(s): AST, ALT, ALKPHOS, BILITOT, PROT, ALBUMIN in the last 168 hours. No results for input(s): LIPASE, AMYLASE in the last 168 hours. No results for input(s): AMMONIA in the last 168 hours.  CBC: Recent Labs  Lab 03/09/18 0903 03/10/18 0620  WBC 12.3* 11.5*  NEUTROABS 10.2*  --   HGB 10.6* 10.6*  HCT 31.6* 32.0*  MCV  94.2 94.6  PLT 248 257    Cardiac Enzymes: Recent Labs  Lab 03/09/18 0903  TROPONINI <0.03    BNP: Invalid input(s): POCBNP  CBG: Recent Labs  Lab 03/10/18 1141 03/10/18 1653 03/10/18 2044 03/11/18 0800 03/11/18 1146  GLUCAP 122* 207* 249* 154* 133*    Microbiology: Results for orders placed or performed during the hospital encounter of 03/09/18  MRSA PCR Screening     Status: None   Collection Time: 03/09/18  2:32 PM  Result Value Ref Range Status   MRSA by PCR NEGATIVE NEGATIVE Final    Comment:        The GeneXpert MRSA Assay (FDA approved for NASAL specimens only), is one component of a comprehensive MRSA colonization surveillance program. It is not intended to diagnose MRSA infection nor to guide or monitor treatment for MRSA infections. Performed at [facility], [address] [place], [place] [address]     Coagulation Studies: No results for input(s): LABPROT, INR in the last 72 hours.  Urinalysis: No results for input(s): COLORURINE, LABSPEC, PHURINE, GLUCOSEU, HGBUR, BILIRUBINUR, KETONESUR, PROTEINUR, UROBILINOGEN, NITRITE, LEUKOCYTESUR in the last 72 hours.  Invalid input(s): APPERANCEUR    Imaging: No results found.   Medications:    . albuterol  2.5 mg Nebulization Q6H  . amLODipine  10 mg Oral Daily  . aspirin EC  81 mg Oral Daily  . calcitRIOL  0.5 mcg Oral Daily  .  calcium acetate  2,001 mg Oral TID WC  . carbamide peroxide  5 drop Left EAR BID  . carvedilol  25 mg Oral BID WC  . cholecalciferol  2,000 Units Oral Daily  . fluticasone  2 spray Each Nare Daily  . gentamicin cream  1 application Topical Daily  . heparin  5,000 Units Subcutaneous Q8H  . hydrALAZINE  25 mg Oral TID  . insulin aspart  0-5 Units Subcutaneous QHS  . insulin aspart  0-9 Units Subcutaneous TID WC  . loratadine  10 mg Oral Daily  . multivitamin  1 tablet Oral Daily   acetaminophen **OR** acetaminophen, guaiFENesin-dextromethorphan, heparin,  nitroGLYCERIN, ondansetron **OR** ondansetron (ZOFRAN) IV, oxyCODONE-acetaminophen  Assessment/ Plan:  57 y.o. male with end stage renal disease on peritoneal dialysis, congestive heart failure, GERD, hypertension, peripheral vascular disease, diabetes mellitus type 2, diabetic retinopathy, diabetic gastroparesis   [facility]/[address]   1.  ESRD on peritoneal dialysis. 2.  Acute pulmonary edema. 3.  Anemia of chronic kidney disease. 4.  Secondary hyperparathyroidism. 5.  Hypertension.  Plan: Patient tolerated peritoneal dialysis well last night with ultrafiltration of 959 cc.  We will plan for ongoing peritoneal dialysis tonight.  Shortness of breath will be treated by hospitalist and he will receive another dose of Solu-Medrol and will continue on nebulizer treatments.  He may also require supplemental oxygen at home based upon additional evaluation.  Patient will continue on Epogen as an outpatient and hemoglobin is at target at 10.6.  Further plan as patient progresses.   LOS: 2 Davidlee Jeanbaptiste 7/12/201912:11 PM

## 2018-03-11 NOTE — Progress Notes (Signed)
Dialysis completed

## 2018-03-11 NOTE — Progress Notes (Signed)
Patient ID: Scott [name], male   DOB: 10-Apr-1961, 57 y.o.   MRN: [id number]  [place] PROGRESS NOTE  Scott [name] DJM:[id number] DOB: 07/07/1961 DOA: 03/09/2018 PCP[facility] [employer]  HPI/Subjective: Patient was unable to come off the oxygen today.  Pulse ox with ambulation 85 to 86%.  Patient states his breathing is a little bit better than when he came in but still little impaired.  Objective: Vitals:   03/11/18 0954 03/11/18 0956  BP: 134/68 122/63  Pulse: 68 68  Resp:    Temp:    SpO2: 94% 94%    Filed Weights   03/09/18 1223 03/10/18 0535 03/11/18 0323  Weight: 102.8 kg (226 lb 9.6 oz) 100.6 kg (221 lb 12.8 oz) 101.2 kg (223 lb 3.2 oz)    ROS: Review of Systems  Constitutional: Negative for chills and fever.  Eyes: Negative for blurred vision.  Respiratory: Positive for cough and shortness of breath.   Cardiovascular: Negative for chest pain.  Gastrointestinal: Negative for abdominal pain, constipation, diarrhea, nausea and vomiting.  Genitourinary: Negative for dysuria.  Musculoskeletal: Negative for joint pain.  Neurological: Negative for dizziness and headaches.   Exam: Physical Exam  Constitutional: He is oriented to person, place, and time.  HENT:  Nose: No mucosal edema.  Mouth/Throat: No oropharyngeal exudate or posterior oropharyngeal edema.  Eyes: Pupils are equal, round, and reactive to light. Conjunctivae, EOM and lids are normal.  Neck: No JVD present. Carotid bruit is not present. No edema present. No thyroid mass and no thyromegaly present.  Cardiovascular: S1 normal and S2 normal. Exam reveals no gallop.  No murmur heard. Pulses:      Dorsalis pedis pulses are 2+ on the right side, and 2+ on the left side.  Respiratory: No respiratory distress. He has decreased breath sounds in the right lower field and the left lower field. He has no wheezes. He has no rhonchi. He has no rales.  GI: Soft. Bowel sounds are normal. There is no  tenderness.  Musculoskeletal:       Right ankle: He exhibits swelling.       Left ankle: He exhibits swelling.  Lymphadenopathy:    He has no cervical adenopathy.  Neurological: He is alert and oriented to person, place, and time. No cranial nerve deficit.  Skin: Skin is warm. No rash noted. Nails show no clubbing.  Psychiatric: He has a normal mood and affect.      Data Reviewed: Basic Metabolic Panel: Recent Labs  Lab 03/09/18 0903 03/10/18 0620  NA 138 140  K 4.6 4.4  CL 106 107  CO2 22 23  GLUCOSE 147* 174*  BUN 49* 50*  CREATININE 6.91* 7.13*  CALCIUM 7.4* 7.6*   CBC: Recent Labs  Lab 03/09/18 0903 03/10/18 0620  WBC 12.3* 11.5*  NEUTROABS 10.2*  --   HGB 10.6* 10.6*  HCT 31.6* 32.0*  MCV 94.2 94.6  PLT 248 257   Cardiac Enzymes: Recent Labs  Lab 03/09/18 0903  TROPONINI <0.03    CBG: Recent Labs  Lab 03/10/18 1141 03/10/18 1653 03/10/18 2044 03/11/18 0800 03/11/18 1146  GLUCAP 122* 207* 249* 154* 133*    Recent Results (from the past 240 hour(s))  MRSA PCR Screening     Status: None   Collection Time: 03/09/18  2:32 PM  Result Value Ref Range Status   MRSA by PCR NEGATIVE NEGATIVE Final    Comment:        The GeneXpert MRSA Assay (  FDA approved for NASAL specimens only), is one component of a comprehensive MRSA colonization surveillance program. It is not intended to diagnose MRSA infection nor to guide or monitor treatment for MRSA infections. Performed at [facility], [address] [place], [place] [address]      Scheduled Meds: . albuterol  2.5 mg Nebulization Q6H  . amLODipine  10 mg Oral Daily  . aspirin EC  81 mg Oral Daily  . calcitRIOL  0.5 mcg Oral Daily  . calcium acetate  2,001 mg Oral TID WC  . carbamide peroxide  5 drop Left EAR BID  . carvedilol  25 mg Oral BID WC  . cholecalciferol  2,000 Units Oral Daily  . fluticasone  2 spray Each Nare Daily  . gentamicin cream  1 application Topical Daily  .  heparin  5,000 Units Subcutaneous Q8H  . hydrALAZINE  25 mg Oral TID  . insulin aspart  0-5 Units Subcutaneous QHS  . insulin aspart  0-9 Units Subcutaneous TID WC  . loratadine  10 mg Oral Daily  . multivitamin  1 tablet Oral Daily   Continuous Infusions:  Assessment/Plan:  1. Acute hypoxic respiratory failure secondary to volume overload and acute diastolic congestive heart failure.  Patient was given Lasix and had peritoneal dialysis.  Patient unable to come off the oxygen as of today.  I mentioned to the patient if I sent him home today he would have to go home with oxygen.  He would like to get off oxygen prior to disposition.  I will give another dose of IV Lasix today and a dose of Solu-Medrol today to see if I can get him off the oxygen for tomorrow morning. 2. Acute on chronic diastolic congestive heart failure.  Patient on Lasix and dialysis to manage fluid.  Patient on Coreg and hydralazine. 3. End-stage renal disease on peritoneal dialysis 4. Essential hypertension on Norvasc, Coreg and hydralazine.  5. GERD on Protonix 6. Allergies and shortness of breath give another dose of steroid. 7. Type 2 diabetes mellitus on sliding scale insulin  Code Status:     Code Status Orders  (From admission, onward)        Start     Ordered   03/09/18 1206  Full code  Continuous     03/09/18 1205    Code Status History    Date Active Date Inactive Code Status Order ID Comments User Context   01/17/2017 0338 01/20/2017 1834 Full Code [id number]  Harrie Foreman, MD ED   11/13/2016 1013 11/20/2016 1256 Full Code [id number]  Hillary Bow, MD ED   01/09/2015 1019 01/10/2015 1518 Full Code [id number]  Max Sane, MD Inpatient   12/18/2014 1620 12/19/2014 1627 Full Code [id number]  Charolette Forward, MD Inpatient   12/14/2014 1600 12/18/2014 1620 Full Code [id number]  Charolette Forward, MD Inpatient   12/13/2014 1324 12/14/2014 1600 Full Code [id number]  Charolette Forward, MD Inpatient     Family  Communication: Wife yesterday. Disposition Plan: Tomorrow.  Consultants:  Nephrology  Time spent: 26 minutes  [place]

## 2018-03-11 NOTE — Plan of Care (Signed)
Ambulated patient around nurses station x1, pt tolerated well, however o2 sat drop to 85% on R/A. One time dose of 40 mg IV lasix given. Will continue to monitor.  Problem: Education: Goal: Knowledge of General Education information will improve Outcome: Progressing   Problem: Health Behavior/Discharge Planning: Goal: Ability to manage health-related needs will improve Outcome: Progressing   Problem: Clinical Measurements: Goal: Respiratory complications will improve Outcome: Progressing

## 2018-03-11 NOTE — Progress Notes (Signed)
SATURATION QUALIFICATIONS: (This note is used to comply with regulatory documentation for home oxygen)  Patient Saturations on Room Air at Rest = 92%  Patient Saturations on Room Air while Ambulating = 85%  Patient Saturations on 2 Liters of oxygen while Ambulating = 93%  Please briefly explain why patient needs home oxygen: 

## 2018-03-11 NOTE — Progress Notes (Signed)
Patient tolerated dialysis with no complaints of pain. Disconnected self from dialysis machine. Resting in bed. Will monitor

## 2018-03-11 NOTE — Care Management (Addendum)
Patient admitted with heart failure. Oxygen is acute. Will need home 02 assessment on the day of  discharge.  Updated primary nurse who informed CM that attending wants another assessment done tomorrow as it is hoped hypoxia will improve after receiving IV lasix.  Currently is not receiving any services in the home.  Patient with ESRD and performs peritoneal dialysis.  Is followed by [facility] on [address]. Elvera Bicker with [facility] [employer] notified.

## 2018-03-12 LAB — GLUCOSE, CAPILLARY
GLUCOSE-CAPILLARY: 193 mg/dL — AB (ref 70–99)
Glucose-Capillary: 161 mg/dL — ABNORMAL HIGH (ref 70–99)

## 2018-03-12 MED ORDER — INSULIN ASPART PROT & ASPART (70-30 MIX) 100 UNIT/ML ~~LOC~~ SUSP: 25.0000 [IU] | Freq: Two times a day (BID) | SUBCUTANEOUS | 1 refills | Status: DC

## 2018-03-12 MED ORDER — HYDRALAZINE HCL 25 MG PO TABS: 25.0000 mg | ORAL_TABLET | Freq: Three times a day (TID) | ORAL | 1 refills | Status: DC

## 2018-03-12 NOTE — Progress Notes (Signed)
PD machine alarming "Slow Flow Patient."  This RN checked the tubing for kinks.  Patient stated he hadn't moved.  This RN called [employer] for help but the line hung up.  After pressing "Stop," this RN pressed "Go."  This seems to have worked.  PD machine says, "Fill 4 of 4."

## 2018-03-12 NOTE — Discharge Summary (Signed)
[facility] NAME: Torin Modica    MR#:  [id number]  DATE OF BIRTH:  [birth year]  DATE OF ADMISSION:  03/09/2018 ADMITTING PHYSICIAN: Henreitta Leber, MD  DATE OF DISCHARGE: 03/12/2018  PRIMARY CARE PHYSICIAN: [facility]    ADMISSION DIAGNOSIS:  Acute on chronic systolic congestive heart failure (HCC) [I50.23] Acute respiratory failure with hypoxia (HCC) [J96.01]  DISCHARGE DIAGNOSIS:  acute on chronic diastolic congestive heart failure secondary pulmonary edema feels a lot better. Sats 94% on room air. End-stage renal disease on peritoneal dialysis  SECONDARY DIAGNOSIS:   Past Medical History:  Diagnosis Date  . Arthritis    "left arm; right leg" (12/13/2014)  . Asthma   . CHF (congestive heart failure) ([place])   . Chronic disease anemia    [name] 12/13/2014  . Chronic kidney disease (CKD), stage IV (severe) ([place])    [name] 12/13/2014... on dialysis  . Complication of anesthesia    unable to urinate after CAPD urgery  . Continuous ambulatory peritoneal dialysis status ([place])   . Coronary artery disease    [name] 12/13/2014  . Depression   . Dysrhythmia    patient unaware of irregular heartbeat  . GERD (gastroesophageal reflux disease)   . High cholesterol    [name] 12/13/2014  . Hypertension   . Non-Q wave myocardial infarction ([place])    [name] 12/13/2014  . PVD (peripheral vascular disease) ([place])    [name] 12/13/2014  . Type II diabetes mellitus [facility])     HOSPITAL COURSE:   57 year old male with past medical history of end-stage renal disease on peritoneal dialysis, chronic diastolic CHF, peripheral vascular disease, previous history of MI, diabetes, hypertension, hyperlipidemia who presents to the hospital due to cough, shortness of breath and abdominal pain.  1.  Acute respiratory failure with hypoxia-secondary to volume overload and CHF acute on chronic diastolic .  Patient was hypoxic and  his chest x-ray findings were suggestive of pulmonary vascular congestion and edema. -Patient does make urine and therefore will aggressively diuresis with IV Lasix-- patient had ultrafiltration with peritoneal dialysis around 1500 mL. He feels back to baseline. Sats more than 92% on room air. - Wean off oxygen  To RA  2.  CHF-acute on chronic diastolic dysfunction.  -We will also continue peritoneal dialysis exchanges. -Continue carvedilol, hydralazine. -EF 5255%  3.  End-stage renal disease on peritoneal dialysis -consulted nephrology, continue dialysis exchanges as per them. -Okay from nephrology standpoint to go home today  4.  Secondary hyperparathyroidism- continue PhosLo, Rocaltrol.  5.  Essential hypertension-continue Norvasc, carvedilol, hydralazine.  6.  GERD-continue Protonix.  7.  Diabetes type 2 without complication- will place on SSI for now.  - follow BS.  -patient states he takes 7030 insulin according to his sugars. I'll discharge him on 25 units of 7030 BID. He resume his sugar check as before  Patient agreeable to go home.  CONSULTS OBTAINED:  Treatment Team:  Anthonette Legato, MD  DRUG ALLERGIES:  No Known Allergies  DISCHARGE MEDICATIONS:   Allergies as of 03/12/2018   No Known Allergies     Medication List    STOP taking these medications   atorvastatin 80 MG tablet Commonly known as:  LIPITOR   nitroGLYCERIN 0.4 MG SL tablet Commonly known as:  NITROSTAT   prasugrel 10 MG Tabs tablet Commonly known as:  EFFIENT     TAKE these medications   albuterol 108 (90 Base) MCG/ACT inhaler Commonly  known as:  PROVENTIL HFA;VENTOLIN HFA Inhale 2 puffs into the lungs every 6 (six) hours as needed for wheezing or shortness of breath.   amLODipine 10 MG tablet Commonly known as:  NORVASC Take 10 mg by mouth daily.   aspirin EC 81 MG tablet Take 1 tablet (81 mg total) by mouth daily.   calcitRIOL 0.5 MCG capsule Commonly known as:   ROCALTROL Take 1 capsule by mouth daily.   calcium acetate 667 MG capsule Commonly known as:  PHOSLO Take 2,001 mg by mouth 3 (three) times daily with meals.   carvedilol 25 MG tablet Commonly known as:  COREG Take 1 tablet (25 mg total) by mouth 2 (two) times daily with a meal.   DIALYVITE 800 0.8 MG Tabs Take 1 tablet by mouth daily.   furosemide 40 MG tablet Commonly known as:  LASIX Take 40 mg by mouth daily.   gentamicin cream 0.1 % Commonly known as:  GARAMYCIN Apply 1 application topically daily.   hydrALAZINE 25 MG tablet Commonly known as:  APRESOLINE Take 1 tablet (25 mg total) by mouth 3 (three) times daily.   insulin aspart protamine- aspart (70-30) 100 UNIT/ML injection Commonly known as:  NOVOLOG MIX 70/30 Inject 0.25 mLs (25 Units total) into the skin 2 (two) times daily with a meal. What changed:  how much to take   omeprazole 20 MG capsule Commonly known as:  PRILOSEC Take 1 capsule (20 mg total) by mouth daily.   ONE TOUCH ULTRA TEST test strip Generic drug:  glucose blood   Vitamin D 2000 units tablet Take 2,000 Units by mouth daily.       If you experience worsening of your admission symptoms, develop shortness of breath, life threatening emergency, suicidal or homicidal thoughts you must seek medical attention immediately by calling 911 or calling your MD immediately  if symptoms less severe.  You Must read complete instructions/literature along with all the possible adverse reactions/side effects for all the Medicines you take and that have been prescribed to you. Take any new Medicines after you have completely understood and accept all the possible adverse reactions/side effects.   Please note  You were cared for by a hospitalist during your hospital stay. If you have any questions about your discharge medications or the care you received while you were in the hospital after you are discharged, you can call the unit and asked to speak with the  hospitalist on call if the hospitalist that took care of you is not available. Once you are discharged, your primary care physician will handle any further medical issues. Please note that NO REFILLS for any discharge medications will be authorized once you are discharged, as it is imperative that you return to your primary care physician (or establish a relationship with a primary care physician if you do not have one) for your aftercare needs so that they can reassess your need for medications and monitor your lab values. Today   SUBJECTIVE   No new complaints  VITAL SIGNS:  Blood pressure 130/67, pulse 67, temperature 98.3 F (36.8 C), temperature source Oral, resp. rate 18, height 5\' 9"  (1.753 m), weight 101.3 kg (223 lb 4.8 oz), SpO2 94 %.  I/O:    Intake/Output Summary (Last 24 hours) at 03/12/2018 1110 Last data filed at 03/12/2018 0850 Gross per 24 hour  Intake 480 ml  Output 1645 ml  Net -1165 ml    PHYSICAL EXAMINATION:  GENERAL:  57 y.o.-year-old patient lying in  the bed with no acute distress.  EYES: Pupils equal, round, reactive to light and accommodation. No scleral icterus. Extraocular muscles intact.  HEENT: Head atraumatic, normocephalic. Oropharynx and nasopharynx clear.  NECK:  Supple, no jugular venous distention. No thyroid enlargement, no tenderness.  LUNGS: Normal breath sounds bilaterally, no wheezing, rales,rhonchi or crepitation. No use of accessory muscles of respiration.  CARDIOVASCULAR: S1, S2 normal. No murmurs, rubs, or gallops.  ABDOMEN: Soft, non-tender, non-distended. Bowel sounds present. No organomegaly or mass. PD catheter site looks okay EXTREMITIES: No pedal edema, cyanosis, or clubbing.  NEUROLOGIC: Cranial nerves II through XII are intact. Muscle strength 5/5 in all extremities. Sensation intact. Gait not checked.  PSYCHIATRIC:  patient is alert and oriented x 3.  SKIN: No obvious rash, lesion, or ulcer.   DATA REVIEW:   CBC  Recent Labs   Lab 03/10/18 0620  WBC 11.5*  HGB 10.6*  HCT 32.0*  PLT 257    Chemistries  Recent Labs  Lab 03/10/18 0620  NA 140  K 4.4  CL 107  CO2 23  GLUCOSE 174*  BUN 50*  CREATININE 7.13*  CALCIUM 7.6*    Microbiology Results   Recent Results (from the past 240 hour(s))  MRSA PCR Screening     Status: None   Collection Time: 03/09/18  2:32 PM  Result Value Ref Range Status   MRSA by PCR NEGATIVE NEGATIVE Final    Comment:        The GeneXpert MRSA Assay (FDA approved for NASAL specimens only), is one component of a comprehensive MRSA colonization surveillance program. It is not intended to diagnose MRSA infection nor to guide or monitor treatment for MRSA infections. Performed at [facility], [address], [place], [place] [address]     RADIOLOGY:  No results found.   Management plans discussed with the patient, family and they are in agreement.  CODE STATUS:     Code Status Orders  (From admission, onward)        Start     Ordered   03/09/18 1206  Full code  Continuous     03/09/18 1205    Code Status History    Date Active Date Inactive Code Status Order ID Comments User Context   01/17/2017 0338 01/20/2017 1834 Full Code [id number]  Harrie Foreman, MD ED   11/13/2016 1013 11/20/2016 1256 Full Code [id number]  Hillary Bow, MD ED   01/09/2015 1019 01/10/2015 1518 Full Code [id number]  Max Sane, MD Inpatient   12/18/2014 1620 12/19/2014 1627 Full Code [id number]  Charolette Forward, MD Inpatient   12/14/2014 1600 12/18/2014 1620 Full Code [id number]  Charolette Forward, MD Inpatient   12/13/2014 1324 12/14/2014 1600 Full Code [id number]  Charolette Forward, MD Inpatient      TOTAL TIME TAKING CARE OF THIS PATIENT: *30* minutes.    Fritzi Mandes M.D on 03/12/2018 at 11:10 AM  Between 7am to 6pm - Pager - [phone] After 6pm go to [email or web address] - password [email or web address] [facility]  Office  [phone]  CC: Primary care physician; [facility],  [employer]

## 2018-03-12 NOTE — Progress Notes (Signed)
RN removed IVs. Patient left in a private vehicle.  Phillis Knack, RN

## 2018-03-12 NOTE — Progress Notes (Signed)
[facility]  ROUNDING NOTE   Subjective:  Shortness of breath has improved. Did well with peritoneal dialysis overnight. 995 cc of ultrafiltration was performed.   Objective:  Vital signs in last 24 hours:  Temp:  [98.1 F (36.7 C)-98.5 F (36.9 C)] 98.3 F (36.8 C) (07/13 0725) Pulse Rate:  [67-73] 67 (07/13 0725) Resp:  [16-18] 18 (07/13 0725) BP: (119-135)/(58-72) 130/67 (07/13 0725) SpO2:  [88 %-97 %] 94 % (07/13 0725) Weight:  [101.3 kg (223 lb 4.8 oz)] 101.3 kg (223 lb 4.8 oz) (07/13 0351)  Weight change: 0.045 kg (1.6 oz) Filed Weights   03/10/18 0535 03/11/18 0323 03/12/18 0351  Weight: 100.6 kg (221 lb 12.8 oz) 101.2 kg (223 lb 3.2 oz) 101.3 kg (223 lb 4.8 oz)    Intake/Output: I/O last 3 completed shifts: In: 720 [P.O.:720] Out: 1709 [Urine:750; Other:959]   Intake/Output this shift:  Total I/O In: -  Out: 6712 [Urine:200; Other:995]  Physical Exam: General: No acute distress  Head: Normocephalic, atraumatic. Moist oral mucosal membranes  Eyes: Anicteric  Neck: Supple, trachea midline  Lungs:  Decreased air movement, normal effort  Heart: S1S2 no rubs  Abdomen:  Soft, nontender, bowel sounds present  Extremities: trace peripheral edema.  Neurologic: Awake, alert, following commands  Skin: No lesions  Access: Peritoneal dialysis catheter in place    Basic Metabolic Panel: Recent Labs  Lab 03/09/18 0903 03/10/18 0620  NA 138 140  K 4.6 4.4  CL 106 107  CO2 22 23  GLUCOSE 147* 174*  BUN 49* 50*  CREATININE 6.91* 7.13*  CALCIUM 7.4* 7.6*    Liver Function Tests: No results for input(s): AST, ALT, ALKPHOS, BILITOT, PROT, ALBUMIN in the last 168 hours. No results for input(s): LIPASE, AMYLASE in the last 168 hours. No results for input(s): AMMONIA in the last 168 hours.  CBC: Recent Labs  Lab 03/09/18 0903 03/10/18 0620  WBC 12.3* 11.5*  NEUTROABS 10.2*  --   HGB 10.6* 10.6*  HCT 31.6* 32.0*  MCV 94.2 94.6  PLT 248 257     Cardiac Enzymes: Recent Labs  Lab 03/09/18 0903  TROPONINI <0.03    BNP: Invalid input(s): POCBNP  CBG: Recent Labs  Lab 03/11/18 1146 03/11/18 1633 03/11/18 2100 03/12/18 0745 03/12/18 1129  GLUCAP 133* 277* 265* 193* 161*    Microbiology: Results for orders placed or performed during the hospital encounter of 03/09/18  MRSA PCR Screening     Status: None   Collection Time: 03/09/18  2:32 PM  Result Value Ref Range Status   MRSA by PCR NEGATIVE NEGATIVE Final    Comment:        The GeneXpert MRSA Assay (FDA approved for NASAL specimens only), is one component of a comprehensive MRSA colonization surveillance program. It is not intended to diagnose MRSA infection nor to guide or monitor treatment for MRSA infections. Performed at [facility], [address] [place], [place] [address]     Coagulation Studies: No results for input(s): LABPROT, INR in the last 72 hours.  Urinalysis: No results for input(s): COLORURINE, LABSPEC, PHURINE, GLUCOSEU, HGBUR, BILIRUBINUR, KETONESUR, PROTEINUR, UROBILINOGEN, NITRITE, LEUKOCYTESUR in the last 72 hours.  Invalid input(s): APPERANCEUR    Imaging: No results found.   Medications:    . albuterol  2.5 mg Nebulization Q6H  . amLODipine  10 mg Oral Daily  . aspirin EC  81 mg Oral Daily  . calcitRIOL  0.5 mcg Oral Daily  . calcium acetate  2,001 mg Oral TID WC  . carbamide peroxide  5 drop Left EAR BID  . carvedilol  25 mg Oral BID WC  . cholecalciferol  2,000 Units Oral Daily  . fluticasone  2 spray Each Nare Daily  . gentamicin cream  1 application Topical Daily  . heparin  5,000 Units Subcutaneous Q8H  . hydrALAZINE  25 mg Oral TID  . insulin aspart  0-5 Units Subcutaneous QHS  . insulin aspart  0-9 Units Subcutaneous TID WC  . loratadine  10 mg Oral Daily  . multivitamin  1 tablet Oral Daily   acetaminophen **OR** acetaminophen, alum & mag hydroxide-simeth, guaiFENesin-dextromethorphan,  heparin, nitroGLYCERIN, ondansetron **OR** ondansetron (ZOFRAN) IV, oxyCODONE-acetaminophen  Assessment/ Plan:  57 y.o. male with end stage renal disease on peritoneal dialysis, congestive heart failure, GERD, hypertension, peripheral vascular disease, diabetes mellitus type 2, diabetic retinopathy, diabetic gastroparesis   [facility]/[address]   1.  ESRD on peritoneal dialysis. 2.  Acute pulmonary edema. 3.  Anemia of chronic kidney disease. 4.  Secondary hyperparathyroidism. 5.  Hypertension.  Plan: Patient did well with peritoneal dialysis overnight.  He is interested in switching to the cycler.  For now he will continue CAPD until the switch and training can occur.  His pulmonary edema clinically appears much better.  Patient okay to be discharged home now.  Appreciate hospitalist assistance in improving his pulmonary status as he did receive steroids, nebs, and oxygen.   LOS: 3 Emonnie Cannady 7/13/201912:30 PM

## 2018-03-12 NOTE — Discharge Instructions (Signed)
Resume your PD as before 

## 2018-03-12 NOTE — Progress Notes (Signed)
Pd completed 

## 2018-03-17 ENCOUNTER — Ambulatory Visit: Payer: Medicare Other | Attending: Family | Admitting: Family

## 2018-03-17 ENCOUNTER — Encounter: Payer: Self-pay | Admitting: Family

## 2018-03-17 VITALS — BP 136/73 | HR 76 | Resp 18 | Ht 69.0 in | Wt 214.5 lb

## 2018-03-17 DIAGNOSIS — Z7982 Long term (current) use of aspirin: Secondary | ICD-10-CM | POA: Insufficient documentation

## 2018-03-17 DIAGNOSIS — N186 End stage renal disease: Secondary | ICD-10-CM | POA: Insufficient documentation

## 2018-03-17 DIAGNOSIS — I5032 Chronic diastolic (congestive) heart failure: Secondary | ICD-10-CM | POA: Insufficient documentation

## 2018-03-17 DIAGNOSIS — Z794 Long term (current) use of insulin: Secondary | ICD-10-CM

## 2018-03-17 DIAGNOSIS — J45909 Unspecified asthma, uncomplicated: Secondary | ICD-10-CM | POA: Insufficient documentation

## 2018-03-17 DIAGNOSIS — E7800 Pure hypercholesterolemia, unspecified: Secondary | ICD-10-CM | POA: Insufficient documentation

## 2018-03-17 DIAGNOSIS — I252 Old myocardial infarction: Secondary | ICD-10-CM | POA: Insufficient documentation

## 2018-03-17 DIAGNOSIS — I2510 Atherosclerotic heart disease of native coronary artery without angina pectoris: Secondary | ICD-10-CM | POA: Diagnosis present

## 2018-03-17 DIAGNOSIS — E1122 Type 2 diabetes mellitus with diabetic chronic kidney disease: Secondary | ICD-10-CM | POA: Insufficient documentation

## 2018-03-17 DIAGNOSIS — Z87891 Personal history of nicotine dependence: Secondary | ICD-10-CM | POA: Insufficient documentation

## 2018-03-17 DIAGNOSIS — I132 Hypertensive heart and chronic kidney disease with heart failure and with stage 5 chronic kidney disease, or end stage renal disease: Secondary | ICD-10-CM | POA: Insufficient documentation

## 2018-03-17 DIAGNOSIS — E1151 Type 2 diabetes mellitus with diabetic peripheral angiopathy without gangrene: Secondary | ICD-10-CM | POA: Diagnosis not present

## 2018-03-17 DIAGNOSIS — F329 Major depressive disorder, single episode, unspecified: Secondary | ICD-10-CM | POA: Insufficient documentation

## 2018-03-17 DIAGNOSIS — E785 Hyperlipidemia, unspecified: Secondary | ICD-10-CM | POA: Diagnosis not present

## 2018-03-17 DIAGNOSIS — Z992 Dependence on renal dialysis: Secondary | ICD-10-CM | POA: Insufficient documentation

## 2018-03-17 DIAGNOSIS — Z8249 Family history of ischemic heart disease and other diseases of the circulatory system: Secondary | ICD-10-CM | POA: Insufficient documentation

## 2018-03-17 DIAGNOSIS — Z79899 Other long term (current) drug therapy: Secondary | ICD-10-CM | POA: Diagnosis not present

## 2018-03-17 DIAGNOSIS — Z955 Presence of coronary angioplasty implant and graft: Secondary | ICD-10-CM | POA: Insufficient documentation

## 2018-03-17 DIAGNOSIS — K219 Gastro-esophageal reflux disease without esophagitis: Secondary | ICD-10-CM | POA: Insufficient documentation

## 2018-03-17 DIAGNOSIS — I10 Essential (primary) hypertension: Secondary | ICD-10-CM

## 2018-03-17 NOTE — Patient Instructions (Signed)
Continue weighing daily and call for an overnight weight gain of > 2 pounds or a weekly weight gain of >5 pounds. 

## 2018-03-17 NOTE — Progress Notes (Signed)
Patient ID: Scott [name], male    DOB: [birth year], 59 y.o.   MRN: [id number]  HPI  Scott [name] is a 56 yoM with PMH of HTN, ESRD on dialysis M/W/F, T2DM, HLD, CAD, NSTEMI s/p stents 4/17, congestive heart failure with preserved ejection fraction.  Echo report 7/101/9 reviewed and showed an EF of 50-55%. Echo on 11/13/16 showed EF of 55-60% similar to echo on 01/09/15 showing EF of 55-65%  Admitted 03/09/18 due to acute on chronic HF. Given IV lasix along with peritoneal dialysis resulting in loss of 1535ml. Weaned off oxygen to room air. Discharged after 3 days.   Presents today for a follow-up visit with a chief complaint of minimal fatigue upon moderate exertion. He describes this as chronic in nature having been present for several years. He has associated cough along with this. He denies any difficulty sleeping, abdominal distention, palpitations, pedal edema, chest pain, dizziness or weight gain. He says that he didn't realize he was gaining so much fluid and thinks he may have been using the wrong dialysis solution. Sees nephrology the end of this month.   Past Medical History:  Diagnosis Date  . Arthritis    "left arm; right leg" (12/13/2014)  . Asthma   . CHF (congestive heart failure) ([place])   . Chronic disease anemia    [name] 12/13/2014  . Chronic kidney disease (CKD), stage IV (severe) ([place])    [name] 12/13/2014... on dialysis  . Complication of anesthesia    unable to urinate after CAPD urgery  . Continuous ambulatory peritoneal dialysis status ([place])   . Coronary artery disease    [name] 12/13/2014  . Depression   . Dysrhythmia    patient unaware of irregular heartbeat  . GERD (gastroesophageal reflux disease)   . High cholesterol    [name] 12/13/2014  . Hypertension   . Non-Q wave myocardial infarction ([place])    [name] 12/13/2014  . PVD (peripheral vascular disease) ([place])    [name] 12/13/2014  . Type II diabetes mellitus ([place])    Past Surgical History:  Procedure  Laterality Date  . AV FISTULA PLACEMENT Right 04/07/2017   Procedure: ARTERIOVENOUS (AV) FISTULA CREATION ( BRACHIALCEPHALIC );  Surgeon: Katha Cabal, MD;  Location: [facility];  Service: Vascular;  Laterality: Right;  . CAPD INSERTION N/A 04/07/2017   Procedure: LAPAROSCOPIC INSERTION CONTINUOUS AMBULATORY PERITONEAL DIALYSIS  (CAPD) CATHETER;  Surgeon: Katha Cabal, MD;  Location: [facility];  Service: Vascular;  Laterality: N/A;  . CARDIAC CATHETERIZATION  11/2014  . COLONOSCOPY WITH PROPOFOL N/A 12/01/2017   Procedure: COLONOSCOPY WITH PROPOFOL;  Surgeon: Lin Landsman, MD;  Location: [place];  Service: Endoscopy;  Laterality: N/A;  Diabetic - insulin  . COLONOSCOPY WITH PROPOFOL N/A 12/29/2017   Procedure: COLONOSCOPY WITH PROPOFOL;  Surgeon: Lin Landsman, MD;  Location: [place];  Service: Endoscopy;  Laterality: N/A;  . DIALYSIS/PERMA CATHETER INSERTION N/A 01/18/2017   Procedure: Dialysis/Perma Catheter Insertion;  Surgeon: Algernon Huxley, MD;  Location: [place] [facility];  Service: Cardiovascular;  Laterality: N/A;  . DIALYSIS/PERMA CATHETER REMOVAL N/A 07/26/2017   Procedure: DIALYSIS/PERMA CATHETER REMOVAL;  Surgeon: Algernon Huxley, MD;  Location: [place] [facility];  Service: Cardiovascular;  Laterality: N/A;  . INCISION AND DRAINAGE OF WOUND Right ~ 10/2014   "4th toe foot"  . PERCUTANEOUS CORONARY STENT INTERVENTION (PCI-S) N/A 12/14/2014   Procedure: PERCUTANEOUS CORONARY STENT INTERVENTION (PCI-S);  Surgeon: Charolette Forward, MD;  Location: [facility];  Service: Cardiovascular;  Laterality: N/A;  . PERCUTANEOUS CORONARY STENT INTERVENTION (PCI-S) N/A 12/18/2014   Procedure: PERCUTANEOUS CORONARY STENT INTERVENTION (PCI-S);  Surgeon: Charolette Forward, MD;  Location: [facility];  Service: Cardiovascular;  Laterality: N/A;  . POLYPECTOMY  12/01/2017   Procedure: POLYPECTOMY;  Surgeon: Lin Landsman, MD;  Location: [place];  Service:  Endoscopy;;  . POLYPECTOMY  12/29/2017   Procedure: POLYPECTOMY;  Surgeon: Lin Landsman, MD;  Location: [place];  Service: Endoscopy;;  . TOE AMPUTATION Right 2015   4th toe   Family History  Problem Relation Age of Onset  . Cancer Mother        Lung  . Cancer Father        Lung  . Diabetes Sister   . Diabetes Brother   . CAD Brother   . Hernia Son   . Cancer Brother    Social History   Tobacco Use  . Smoking status: Former Smoker    Packs/day: 1.00    Years: 30.00    Pack years: 30.00    Types: Cigarettes    Start date: 08/31/1984    Last attempt to quit: 11/30/2014    Years since quitting: 3.2  . Smokeless tobacco: Never Used  Substance Use Topics  . Alcohol use: Yes    Comment: Rarely, twice a year.   No Known Allergies  Prior to Admission medications   Medication Sig Start Date End Date Taking? Authorizing Provider  albuterol (PROVENTIL HFA;VENTOLIN HFA) 108 (90 BASE) MCG/ACT inhaler Inhale 2 puffs into the lungs every 6 (six) hours as needed for wheezing or shortness of breath. 01/10/15  Yes Gladstone Lighter, MD  amLODipine (NORVASC) 10 MG tablet Take 10 mg by mouth daily.   Yes [provider]  aspirin EC 81 MG tablet Take 1 tablet (81 mg total) by mouth daily. 01/10/15  Yes Gladstone Lighter, MD  B Complex-C-Folic Acid (DIALYVITE 400) 0.8 MG TABS Take 1 tablet by mouth daily. 10/25/17  Yes [provider]  calcitRIOL (ROCALTROL) 0.25 MCG capsule Take 0.25 mcg by mouth daily.   Yes [provider]  calcium acetate (PHOSLO) 667 MG capsule Take 2,001 mg by mouth 3 (three) times daily with meals. Takes 1-2 caps if he has snacks 06/08/17  Yes [provider]  carvedilol (COREG) 25 MG tablet Take 1 tablet (25 mg total) by mouth 2 (two) times daily with a meal. 08/30/17  Yes Darylene Price A, FNP  Cholecalciferol (VITAMIN D) 2000 units tablet Take 2,000 Units by mouth daily. Unsure how many units   Yes [provider]  furosemide (LASIX) 40 MG tablet Take 40 mg by mouth daily.    Yes [provider]  gentamicin cream (GARAMYCIN) 0.1 % Apply 1 application topically daily. 03/02/18  Yes [provider]  hydrALAZINE (APRESOLINE) 25 MG tablet Take 1 tablet (25 mg total) by mouth 3 (three) times daily. Patient taking differently: Take 25 mg by mouth 3 (three) times daily. Pt states he only takes once a day 03/12/18 06/10/18 Yes Fritzi Mandes, MD  insulin aspart protamine- aspart (NOVOLOG MIX 70/30) (70-30) 100 UNIT/ML injection Inject 0.25 mLs (25 Units total) into the skin 2 (two) times daily with a meal. Patient taking differently: Inject 20-25 Units into the skin 2 (two) times daily with a meal.  03/12/18  Yes Fritzi Mandes, MD  nitroGLYCERIN (NITROSTAT) 0.4 MG SL tablet Place 0.4 mg under the tongue every 5 (five) minutes as needed for  chest pain. Pt needs new Rx. Bottle has expried   Yes [provider]  omeprazole (PRILOSEC) 20 MG capsule Take 1 capsule (20 mg total) by mouth daily. 01/10/15  Yes Gladstone Lighter, MD  ONE TOUCH ULTRA TEST test strip  11/25/17  Yes [provider]  prasugrel (EFFIENT) 10 MG TABS tablet Take 10 mg by mouth daily.   Yes [provider]  calcitRIOL (ROCALTROL) 0.5 MCG capsule Take 1 capsule by mouth daily. 02/28/18   [provider]    Review of Systems  Constitutional: Positive for fatigue. Negative for appetite change and fever.  HENT: Negative for congestion, postnasal drip and sore throat.   Respiratory: Positive for cough (causing pain in chest when coughing). Negative for chest tightness and shortness of breath.   Cardiovascular: Negative for chest pain, palpitations and leg swelling.  Gastrointestinal: Negative for abdominal distention and abdominal pain.  Endocrine: Negative.   Genitourinary: Negative.   Musculoskeletal: Negative for back pain and neck pain.  Skin: Negative.   Allergic/Immunologic: Negative.    Neurological: Negative for dizziness, light-headedness and headaches.  Hematological: Negative for adenopathy. Does not bruise/bleed easily.  Psychiatric/Behavioral: Negative for dysphoric mood and sleep disturbance. The patient is not nervous/anxious.    Vitals:   03/17/18 0918  BP: 136/73  Pulse: 76  Resp: 18  SpO2: 96%  Weight: 214 lb 8 oz (97.3 kg)  Height: 5\' 9"  (1.753 m)   Wt Readings from Last 3 Encounters:  03/17/18 214 lb 8 oz (97.3 kg)  03/12/18 223 lb 4.8 oz (101.3 kg)  02/22/18 239 lb 8 oz (108.6 kg)   Lab Results  Component Value Date   CREATININE 7.13 (H) 03/10/2018   CREATININE 6.91 (H) 03/09/2018   CREATININE 7.67 (H) 07/10/2017    Physical Exam  Constitutional: He is oriented to person, place, and time. He appears well-developed and well-nourished.  HENT:  Head: Normocephalic and atraumatic.  Neck: Normal range of motion. Neck supple. No JVD present.  Cardiovascular: Normal rate and regular rhythm.  Pulmonary/Chest: Effort normal. He has no wheezes. He has no rales.  Abdominal: Soft. He exhibits no distension. There is no tenderness.  Musculoskeletal: He exhibits edema (trace pitting edema bilateral lower legs). He exhibits no tenderness.  Neurological: He is alert and oriented to person, place, and time.  Skin: Skin is warm and dry.  Psychiatric: He has a normal mood and affect. His behavior is normal. Thought content normal.  Nursing note and vitals reviewed.   Assessment & Plan:  1. Chronic heart failure with preserved ejection fraction- - NYHA class II - euvolemic today - Not adding salt to foods. Reminded to follow < 2000 mg sodium diet.  - weighing daily and weight has declined. reminded him to call for overnight weight gain of >2 pounds or weekly weight gain of >5 pounds - weight down 25 pounds since he was last here 3 weeks ago - saw cardiologist ([place]) 10/21/16 - has increased his exercise and he was encouraged to continue his  exercise - drank ~ 30 ounces of fluid yesterday - PharmD reconciled medications with the patient  2. Diabetes- - checking 3x daily; fasting this morning at home was 147 - 11/13/16 A1c 7.0%; says that his most recent A1c was 5.0% - saw endocrinologist ([place]) 12/21/17  3. HTN - BP looks good today - BMP on 03/10/18 reviewed and shows sodium 140, potassium 4.4 and GFR 7 - says that he saw his PCP at [facility] a few  weeks ago  4. ESRD- - does home dialysis three times daily and for ~ 15 minutes each time - started process of being worked up for renal transplant - sees Dr. Juleen China later this month  Medication bottles were reviewed.   Return in 5 months or sooner for any questions/problems before then.

## 2018-05-03 ENCOUNTER — Emergency Department
Admission: EM | Admit: 2018-05-03 | Discharge: 2018-05-04 | Disposition: A | Discharge status: Home / Self Care | Payer: Medicare Other | Attending: Emergency Medicine | Admitting: Emergency Medicine

## 2018-05-03 ENCOUNTER — Emergency Department: Payer: Medicare Other

## 2018-05-03 ENCOUNTER — Encounter: Payer: Self-pay | Admitting: Emergency Medicine

## 2018-05-03 ENCOUNTER — Other Ambulatory Visit: Payer: Self-pay

## 2018-05-03 DIAGNOSIS — I509 Heart failure, unspecified: Secondary | ICD-10-CM | POA: Diagnosis not present

## 2018-05-03 DIAGNOSIS — Z79899 Other long term (current) drug therapy: Secondary | ICD-10-CM | POA: Insufficient documentation

## 2018-05-03 DIAGNOSIS — I130 Hypertensive heart and chronic kidney disease with heart failure and stage 1 through stage 4 chronic kidney disease, or unspecified chronic kidney disease: Secondary | ICD-10-CM | POA: Insufficient documentation

## 2018-05-03 DIAGNOSIS — N184 Chronic kidney disease, stage 4 (severe): Secondary | ICD-10-CM | POA: Insufficient documentation

## 2018-05-03 DIAGNOSIS — Z87891 Personal history of nicotine dependence: Secondary | ICD-10-CM | POA: Diagnosis not present

## 2018-05-03 DIAGNOSIS — Z7982 Long term (current) use of aspirin: Secondary | ICD-10-CM | POA: Insufficient documentation

## 2018-05-03 DIAGNOSIS — Z794 Long term (current) use of insulin: Secondary | ICD-10-CM | POA: Diagnosis not present

## 2018-05-03 DIAGNOSIS — R0789 Other chest pain: Secondary | ICD-10-CM | POA: Diagnosis present

## 2018-05-03 DIAGNOSIS — I2510 Atherosclerotic heart disease of native coronary artery without angina pectoris: Secondary | ICD-10-CM | POA: Insufficient documentation

## 2018-05-03 DIAGNOSIS — E1122 Type 2 diabetes mellitus with diabetic chronic kidney disease: Secondary | ICD-10-CM | POA: Insufficient documentation

## 2018-05-03 DIAGNOSIS — M7918 Myalgia, other site: Secondary | ICD-10-CM | POA: Insufficient documentation

## 2018-05-03 DIAGNOSIS — J45909 Unspecified asthma, uncomplicated: Secondary | ICD-10-CM | POA: Insufficient documentation

## 2018-05-03 LAB — CBC
HCT: 29.3 % — ABNORMAL LOW (ref 40.0–52.0)
HEMOGLOBIN: 9.7 g/dL — AB (ref 13.0–18.0)
MCH: 31.2 pg (ref 26.0–34.0)
MCHC: 33 g/dL (ref 32.0–36.0)
MCV: 94.5 fL (ref 80.0–100.0)
PLATELETS: 223 10*3/uL (ref 150–440)
RBC: 3.1 MIL/uL — AB (ref 4.40–5.90)
RDW: 18.4 % — ABNORMAL HIGH (ref 11.5–14.5)
WBC: 7.7 10*3/uL (ref 3.8–10.6)

## 2018-05-03 LAB — BASIC METABOLIC PANEL
ANION GAP: 10 (ref 5–15)
BUN: 54 mg/dL — ABNORMAL HIGH (ref 6–20)
CO2: 19 mmol/L — AB (ref 22–32)
CREATININE: 7.59 mg/dL — AB (ref 0.61–1.24)
Calcium: 6.9 mg/dL — ABNORMAL LOW (ref 8.9–10.3)
Chloride: 113 mmol/L — ABNORMAL HIGH (ref 98–111)
GFR calc non Af Amer: 7 mL/min — ABNORMAL LOW (ref >60)
GFR, EST AFRICAN AMERICAN: 8 mL/min — AB (ref >60)
Glucose, Bld: 129 mg/dL — ABNORMAL HIGH (ref 70–99)
Potassium: 5.2 mmol/L — ABNORMAL HIGH (ref 3.5–5.1)
SODIUM: 142 mmol/L (ref 135–145)

## 2018-05-03 LAB — TROPONIN I

## 2018-05-03 NOTE — ED Notes (Signed)
ED Provider at bedside. 

## 2018-05-03 NOTE — ED Provider Notes (Signed)
[facility] Emergency Department Provider Note  ____________________________________________   First MD Initiated Contact with Patient 05/03/18 2345     (approximate)  I have reviewed the triage vital signs and the nursing notes.   HISTORY  Chief Complaint Chest Pain and Back Pain    HPI Scott [name] is a 57 y.o. male with medical history as listed below which notably includes prior catheterization with stent placement, CHF, chronic kidney disease on peritoneal dialysis, who presents for evaluation of about 4 days of left-sided chest wall pain.  The patient does not remember any particular trauma or strain, but he says that it feels like a pulled muscle.  The pain is severe with movement or palpation of the area.  It is mild to moderate at rest but it makes him uncomfortable and makes it difficult to rest.  It is not been getting better or worse over the last 4 days.  He denies fever/chills, shortness of breath, nausea, vomiting, and abdominal pain.  He has had some cough recently and that exacerbates the pain as well.  He had his normal at home dialysis today.  He has no other complaints at this time.  Past Medical History:  Diagnosis Date  . Arthritis    "left arm; right leg" (12/13/2014)  . Asthma   . CHF (congestive heart failure) ([place])   . Chronic disease anemia    [name] 12/13/2014  . Chronic kidney disease (CKD), stage IV (severe) ([place])    [name] 12/13/2014... on dialysis  . Complication of anesthesia    unable to urinate after CAPD urgery  . Continuous ambulatory peritoneal dialysis status ([place])   . Coronary artery disease    [name] 12/13/2014  . Depression   . Dysrhythmia    patient unaware of irregular heartbeat  . GERD (gastroesophageal reflux disease)   . High cholesterol    [name] 12/13/2014  . Hypertension   . Non-Q wave myocardial infarction ([place])    [name] 12/13/2014  . PVD (peripheral vascular disease) ([place])    [name] 12/13/2014  .  Type II diabetes mellitus [facility])     Patient Active Problem List   Diagnosis Date Noted  . History of colonic polyps   . Positive fecal occult blood test   . End stage renal disease ([place]) 11/25/2016  . Tobacco abuse 11/25/2016  . CHF (congestive heart failure) ([place]) 11/13/2016  . Diabetes ([place]) 09/17/2015  . Decreased renal function 05/23/2015  . GERD (gastroesophageal reflux disease) 04/04/2015  . Hypertension 02/26/2015    Past Surgical History:  Procedure Laterality Date  . AV FISTULA PLACEMENT Right 04/07/2017   Procedure: ARTERIOVENOUS (AV) FISTULA CREATION ( BRACHIALCEPHALIC );  Surgeon: Katha Cabal, MD;  Location: [facility] ORS;  Service: Vascular;  Laterality: Right;  . CAPD INSERTION N/A 04/07/2017   Procedure: LAPAROSCOPIC INSERTION CONTINUOUS AMBULATORY PERITONEAL DIALYSIS  (CAPD) CATHETER;  Surgeon: Katha Cabal, MD;  Location: [facility] ORS;  Service: Vascular;  Laterality: N/A;  . CARDIAC CATHETERIZATION  11/2014  . COLONOSCOPY WITH PROPOFOL N/A 12/01/2017   Procedure: COLONOSCOPY WITH PROPOFOL;  Surgeon: Lin Landsman, MD;  Location: [place];  Service: Endoscopy;  Laterality: N/A;  Diabetic - insulin  . COLONOSCOPY WITH PROPOFOL N/A 12/29/2017   Procedure: COLONOSCOPY WITH PROPOFOL;  Surgeon: Lin Landsman, MD;  Location: [place];  Service: Endoscopy;  Laterality: N/A;  . DIALYSIS/PERMA CATHETER INSERTION N/A 01/18/2017   Procedure: Dialysis/Perma Catheter Insertion;  Surgeon: Algernon Huxley, MD;  Location: [place] CV LAB;  Service: Cardiovascular;  Laterality: N/A;  . DIALYSIS/PERMA CATHETER REMOVAL N/A 07/26/2017   Procedure: DIALYSIS/PERMA CATHETER REMOVAL;  Surgeon: Algernon Huxley, MD;  Location: [place] CV LAB;  Service: Cardiovascular;  Laterality: N/A;  . INCISION AND DRAINAGE OF WOUND Right ~ 10/2014   "4th toe foot"  . PERCUTANEOUS CORONARY STENT INTERVENTION (PCI-S) N/A 12/14/2014   Procedure: PERCUTANEOUS CORONARY STENT  INTERVENTION (PCI-S);  Surgeon: Charolette Forward, MD;  Location: [facility] CATH LAB;  Service: Cardiovascular;  Laterality: N/A;  . PERCUTANEOUS CORONARY STENT INTERVENTION (PCI-S) N/A 12/18/2014   Procedure: PERCUTANEOUS CORONARY STENT INTERVENTION (PCI-S);  Surgeon: Charolette Forward, MD;  Location: [facility] CATH LAB;  Service: Cardiovascular;  Laterality: N/A;  . POLYPECTOMY  12/01/2017   Procedure: POLYPECTOMY;  Surgeon: Lin Landsman, MD;  Location: [place];  Service: Endoscopy;;  . POLYPECTOMY  12/29/2017   Procedure: POLYPECTOMY;  Surgeon: Lin Landsman, MD;  Location: [place];  Service: Endoscopy;;  . TOE AMPUTATION Right 2015   4th toe    Prior to Admission medications   Medication Sig Start Date End Date Taking? Authorizing Provider  albuterol (PROVENTIL HFA;VENTOLIN HFA) 108 (90 BASE) MCG/ACT inhaler Inhale 2 puffs into the lungs every 6 (six) hours as needed for wheezing or shortness of breath. 01/10/15   Gladstone Lighter, MD  amLODipine (NORVASC) 10 MG tablet Take 10 mg by mouth daily.    [provider]  aspirin EC 81 MG tablet Take 1 tablet (81 mg total) by mouth daily. 01/10/15   Gladstone Lighter, MD  B Complex-C-Folic Acid (DIALYVITE 809) 0.8 MG TABS Take 1 tablet by mouth daily. 10/25/17   [provider]  calcitRIOL (ROCALTROL) 0.25 MCG capsule Take 0.25 mcg by mouth daily.    [provider]  calcitRIOL (ROCALTROL) 0.5 MCG capsule Take 1 capsule by mouth daily. 02/28/18   [provider]  calcium acetate (PHOSLO) 667 MG capsule Take 2,001 mg by mouth 3 (three) times daily with meals. Takes 1-2 caps if he has snacks 06/08/17   [provider]  carvedilol (COREG) 25 MG tablet Take 1 tablet (25 mg total) by mouth 2 (two) times daily with a meal. 08/30/17   Alisa Graff, FNP  Cholecalciferol (VITAMIN D) 2000 units tablet Take 2,000 Units by mouth daily. Unsure how many units    [provider]  furosemide (LASIX)  40 MG tablet Take 40 mg by mouth daily.     [provider]  gentamicin cream (GARAMYCIN) 0.1 % Apply 1 application topically daily. 03/02/18   [provider]  hydrALAZINE (APRESOLINE) 25 MG tablet Take 1 tablet (25 mg total) by mouth 3 (three) times daily. Patient taking differently: Take 25 mg by mouth 3 (three) times daily. Pt states he only takes once a day 03/12/18 06/10/18  Fritzi Mandes, MD  HYDROcodone-acetaminophen (NORCO/VICODIN) 5-325 MG tablet Take 1-2 tablets by mouth every 4 (four) hours as needed for moderate pain. 05/03/18   Hinda Kehr, MD  insulin aspart protamine- aspart (NOVOLOG MIX 70/30) (70-30) 100 UNIT/ML injection Inject 0.25 mLs (25 Units total) into the skin 2 (two) times daily with a meal. Patient taking differently: Inject 20-25 Units into the skin 2 (two) times daily with a meal.  03/12/18   Fritzi Mandes, MD  lidocaine (LIDODERM) 5 % Place 1 patch onto the skin every 12 (twelve) hours. Remove & Discard patch within 12 hours or as directed by MD.  [place] the patch off  for 12 hours before applying a new one. 05/03/18 05/03/19  Hinda Kehr, MD  nitroGLYCERIN (NITROSTAT) 0.4 MG SL tablet Place 0.4 mg under the tongue every 5 (five) minutes as needed for chest pain. Pt needs new Rx. Bottle has expried    [provider]  omeprazole (PRILOSEC) 20 MG capsule Take 1 capsule (20 mg total) by mouth daily. 01/10/15   Gladstone Lighter, MD  ONE TOUCH ULTRA TEST test strip  11/25/17   [provider]  prasugrel (EFFIENT) 10 MG TABS tablet Take 10 mg by mouth daily.    [provider]    Allergies Patient has no known allergies.  Family History  Problem Relation Age of Onset  . Cancer Mother        Lung  . Cancer Father        Lung  . Diabetes Sister   . Diabetes Brother   . CAD Brother   . Hernia Son   . Cancer Brother     Social History Social History   Tobacco Use  . Smoking status: Former Smoker    Packs/day: 1.00    Years:  30.00    Pack years: 30.00    Types: Cigarettes    Start date: 08/31/1984    Last attempt to quit: 11/30/2014    Years since quitting: 3.4  . Smokeless tobacco: Never Used  Substance Use Topics  . Alcohol use: Yes    Comment: Rarely, twice a year.  . Drug use: No    Review of Systems Constitutional: No fever/chills Eyes: No visual changes. ENT: No sore throat. Cardiovascular: Left-sided chest wall pain Respiratory: Denies shortness of breath.  Mild cough for about a week Gastrointestinal: No abdominal pain.  No nausea, no vomiting.  No diarrhea.  No constipation. Genitourinary: Negative for dysuria. Musculoskeletal: Negative for neck pain.  Negative for back pain. Integumentary: Negative for rash. Neurological: Negative for headaches, focal weakness or numbness.   ____________________________________________   PHYSICAL EXAM:  VITAL SIGNS: ED Triage Vitals  Enc Vitals Group     BP 05/03/18 2021 (!) 151/69     Pulse Rate 05/03/18 2021 69     Resp 05/03/18 2021 (!) 24     Temp 05/03/18 2021 97.7 F (36.5 C)     Temp src --      SpO2 05/03/18 2021 97 %     Weight 05/03/18 2022 104.3 kg (230 lb)     Height 05/03/18 2022 1.753 m (5\' 9" )     Head Circumference --      Peak Flow --      Pain Score 05/03/18 2022 10     Pain Loc --      Pain Edu? --      Excl. in [place]? --     Constitutional: Alert and oriented. Well appearing and in no acute distress. Eyes: Conjunctivae are normal.  Head: Atraumatic. Nose: No congestion/rhinnorhea. Mouth/Throat: Mucous membranes are moist. Neck: No stridor.  No meningeal signs.   Cardiovascular: Normal rate, regular rhythm. Good peripheral circulation. Grossly normal heart sounds. Respiratory: Normal respiratory effort.  No retractions. Lungs CTAB.  Good air movement throughout. Gastrointestinal: Soft and nontender. No distention.  Peritoneal dialysis catheters on the abdomen Musculoskeletal: No lower extremity tenderness nor edema. No  gross deformities of extremities.  Highly reproducible tenderness to palpation of the left lateral ribs in the posterior region. Neurologic:  Normal speech and language. No gross focal neurologic deficits are appreciated.  Skin:  Skin is  warm, dry and intact. No rash noted including no erythema nor vesicular lesions Psychiatric: Mood and affect are normal. Speech and behavior are normal.  ____________________________________________   LABS (all labs ordered are listed, but only abnormal results are displayed)  Labs Reviewed  BASIC METABOLIC PANEL - Abnormal; Notable for the following components:      Result Value   Potassium 5.2 (*)    Chloride 113 (*)    CO2 19 (*)    Glucose, Bld 129 (*)    BUN 54 (*)    Creatinine, Ser 7.59 (*)    Calcium 6.9 (*)    GFR calc non Af Amer 7 (*)    GFR calc Af Amer 8 (*)    All other components within normal limits  CBC - Abnormal; Notable for the following components:   RBC 3.10 (*)    Hemoglobin 9.7 (*)    HCT 29.3 (*)    RDW 18.4 (*)    All other components within normal limits  TROPONIN I   ____________________________________________  EKG  ED ECG REPORT I, Hinda Kehr, the attending physician, personally viewed and interpreted this ECG.  Date: 05/03/2018 EKG Time: 20:25 Rate: 68 Rhythm: normal sinus rhythm QRS Axis: normal Intervals: RBBB ST/T Wave abnormalities: Non-specific ST segment / T-wave changes, but no evidence of acute ischemia. Narrative Interpretation: no evidence of acute ischemia   ____________________________________________  RADIOLOGY I, Hinda Kehr, personally viewed and evaluated these images (plain radiographs) as part of my medical decision making, as well as reviewing the written report by the radiologist.  ED MD interpretation: Chronic changes consistent with his tobacco history but no active consolidation, no pneumothorax, no rib fractures  Official radiology report(s): Dg Chest 2 View  Result  Date: 05/03/2018 CLINICAL DATA:  Left-sided chest pain radiating around into the back. EXAM: CHEST - 2 VIEW COMPARISON:  03/09/2018 FINDINGS: Stable cardiomegaly with aortic atherosclerosis. No pulmonary consolidation or overt pulmonary edema. No effusion or pneumothorax. Chronic interstitial prominence. No acute osseous abnormality. IMPRESSION: 1. Stable cardiomegaly with aortic atherosclerosis. 2. Chronic nonspecific mild interstitial prominence without alveolar consolidation nor overt edema. Electronically Signed   By: Ashley Royalty M.D.   On: 05/03/2018 20:49    ____________________________________________   PROCEDURES  Critical Care performed: No   Procedure(s) performed:   Procedures   ____________________________________________   INITIAL IMPRESSION / ASSESSMENT AND PLAN / ED COURSE  As part of my medical decision making, I reviewed the following data within the [place] notes reviewed and incorporated, Labs reviewed , EKG interpreted , Radiograph reviewed  and Notes from prior ED visits, and reviewed the [place] controlled substance database    Differential diagnosis includes, but is not limited to, musculoskeletal pain/strain, rib contusion, rib fracture, pneumothorax, pneumonia, unerupted shingles.  The patient is not having any pain except along the left lateral rib cage and the pain appears very musculoskeletal.  It is reproducible with palpation, stretching, torso rotation, deep breath and cough, etc.  The patient has had the pain for 4 days and there is no indication for second troponin.  His lab work is reassuring with an essentially normal CBC for him, a basic metabolic panel that is also essentially normal for him given that he is a dialysis patient, and a negative troponin.  Chest x-ray shows no acute abnormalities.  Vital signs are normal other than some mild hypertension.  I explained my diagnosis to the patient and muscular skeletal  pain/strain and recommended that  we try Lidoderm patches.I reviewed the patient's prescription history over the last 24 months in the multi-state controlled substances database(s) that includes [place], [place], [place], [place], [place], [place], [place], [place], [place], [place], [place], [place], [place], [employer] [place], [place], [place], [place], [place], [place], [place], [place], [place], [place], [place], [place], [place], [place], [place], [place], [place], [place], [place], [place] PMP, and [place].  Results were notable for only one prescription for Norco that was prescribed more than a year ago.  I am comfortable providing him a short course of Percocet and will recommend close outpatient follow-up.  I gave my usual and customary return precautions.  Tonight I am giving him a Lidoderm patch and to Percocet by mouth.  He agrees with the plan.      ____________________________________________  FINAL CLINICAL IMPRESSION(S) / ED DIAGNOSES  Final diagnoses:  Chest wall pain  Musculoskeletal pain     MEDICATIONS GIVEN DURING THIS VISIT:  Medications  lidocaine (LIDODERM) 5 % 1 patch (has no administration in time range)  oxyCODONE-acetaminophen (PERCOCET/ROXICET) 5-325 MG per tablet 2 tablet (has no administration in time range)     ED Discharge Orders         Ordered    HYDROcodone-acetaminophen (NORCO/VICODIN) 5-325 MG tablet  Every 4 hours PRN     05/04/18 0000    lidocaine (LIDODERM) 5 %  Every 12 hours     05/04/18 0000           Note:  This document was prepared using Dragon voice recognition software and may include unintentional dictation errors.    Hinda Kehr, MD 05/04/18 [phone]

## 2018-05-03 NOTE — ED Triage Notes (Signed)
Pt to triage via w/c with no distress noted; pt reports left sided CP radiating around into back with no accomp symptoms; denies hx of same; st feels like "pulled muscle"; dialysis perf today

## 2018-05-04 MED ORDER — HYDROCODONE-ACETAMINOPHEN 5-325 MG PO TABS: 1.0000 | ORAL_TABLET | ORAL | 0 refills | Status: DC | PRN

## 2018-05-04 MED ORDER — LIDOCAINE 5 % EX PTCH: 1.0000 | MEDICATED_PATCH | Freq: Two times a day (BID) | CUTANEOUS | 0 refills | Status: DC

## 2018-05-04 MED ORDER — OXYCODONE-ACETAMINOPHEN 5-325 MG PO TABS
2.0000 | ORAL_TABLET | Freq: Once | ORAL | Status: AC
  Administered 2018-05-04: 2 via ORAL
  Filled 2018-05-04: qty 2

## 2018-05-04 MED ORDER — LIDOCAINE 5 % EX PTCH
1.0000 | MEDICATED_PATCH | CUTANEOUS | Status: DC
  Administered 2018-05-04: 1 via TRANSDERMAL
  Filled 2018-05-04: qty 1

## 2018-05-04 NOTE — Discharge Instructions (Addendum)

## 2018-06-06 ENCOUNTER — Encounter: Admission: RE | Payer: Self-pay | Source: Ambulatory Visit

## 2018-06-06 ENCOUNTER — Ambulatory Visit: Admission: RE | Admit: 2018-06-06 | Payer: Medicare Other | Source: Ambulatory Visit | Admitting: Gastroenterology

## 2018-06-06 SURGERY — COLONOSCOPY WITH PROPOFOL
Anesthesia: General

## 2018-08-17 ENCOUNTER — Ambulatory Visit: Payer: Medicare Other | Admitting: Family

## 2018-08-17 ENCOUNTER — Telehealth: Payer: Self-pay | Admitting: Family

## 2018-08-17 NOTE — Telephone Encounter (Signed)
Patient did not show for his [facility] Failure [facility] appointment on 08/17/18. Will attempt to reschedule.

## 2018-08-17 NOTE — Progress Notes (Deleted)
Patient ID: Scott [name], male    DOB: 1961-01-16, 57 y.o.   MRN: [id number]  HPI  Scott [name] is a 57 yoM with PMH of HTN, ESRD on dialysis M/W/F, T2DM, HLD, CAD, NSTEMI s/p stents 4/17, congestive heart failure with preserved ejection fraction.  Echo report 03/09/18 reviewed and showed an EF of 50-55%. Echo on 11/13/16 showed EF of 55-60% similar to echo on 01/09/15 showing EF of 55-65%  Was in the ED 05/03/18 due to chest wall pain. Treated and released. Admitted 03/09/18 due to acute on chronic HF. Given IV lasix along with peritoneal dialysis resulting in loss of 1531ml. Weaned off oxygen to room air. Discharged after 3 days.   Presents today for a follow-up visit with a chief complaint of   Past Medical History:  Diagnosis Date  . Arthritis    "left arm; right leg" (12/13/2014)  . Asthma   . CHF (congestive heart failure) ([place])   . Chronic disease anemia    [name] 12/13/2014  . Chronic kidney disease (CKD), stage IV (severe) ([place])    [name] 12/13/2014... on dialysis  . Complication of anesthesia    unable to urinate after CAPD urgery  . Continuous ambulatory peritoneal dialysis status ([place])   . Coronary artery disease    [name] 12/13/2014  . Depression   . Dysrhythmia    patient unaware of irregular heartbeat  . GERD (gastroesophageal reflux disease)   . High cholesterol    [name] 12/13/2014  . Hypertension   . Non-Q wave myocardial infarction ([place])    [name] 12/13/2014  . PVD (peripheral vascular disease) ([place])    [name] 12/13/2014  . Type II diabetes mellitus ([place])    Past Surgical History:  Procedure Laterality Date  . AV FISTULA PLACEMENT Right 04/07/2017   Procedure: ARTERIOVENOUS (AV) FISTULA CREATION ( BRACHIALCEPHALIC );  Surgeon: Katha Cabal, MD;  Location: [facility] ORS;  Service: Vascular;  Laterality: Right;  . CAPD INSERTION N/A 04/07/2017   Procedure: LAPAROSCOPIC INSERTION CONTINUOUS AMBULATORY PERITONEAL DIALYSIS  (CAPD) CATHETER;  Surgeon: Katha Cabal,  MD;  Location: [facility] ORS;  Service: Vascular;  Laterality: N/A;  . CARDIAC CATHETERIZATION  11/2014  . COLONOSCOPY WITH PROPOFOL N/A 12/01/2017   Procedure: COLONOSCOPY WITH PROPOFOL;  Surgeon: Lin Landsman, MD;  Location: [place];  Service: Endoscopy;  Laterality: N/A;  Diabetic - insulin  . COLONOSCOPY WITH PROPOFOL N/A 12/29/2017   Procedure: COLONOSCOPY WITH PROPOFOL;  Surgeon: Lin Landsman, MD;  Location: [place];  Service: Endoscopy;  Laterality: N/A;  . DIALYSIS/PERMA CATHETER INSERTION N/A 01/18/2017   Procedure: Dialysis/Perma Catheter Insertion;  Surgeon: Algernon Huxley, MD;  Location: [place] CV LAB;  Service: Cardiovascular;  Laterality: N/A;  . DIALYSIS/PERMA CATHETER REMOVAL N/A 07/26/2017   Procedure: DIALYSIS/PERMA CATHETER REMOVAL;  Surgeon: Algernon Huxley, MD;  Location: [place] CV LAB;  Service: Cardiovascular;  Laterality: N/A;  . INCISION AND DRAINAGE OF WOUND Right ~ 10/2014   "4th toe foot"  . PERCUTANEOUS CORONARY STENT INTERVENTION (PCI-S) N/A 12/14/2014   Procedure: PERCUTANEOUS CORONARY STENT INTERVENTION (PCI-S);  Surgeon: Charolette Forward, MD;  Location: [facility] CATH LAB;  Service: Cardiovascular;  Laterality: N/A;  . PERCUTANEOUS CORONARY STENT INTERVENTION (PCI-S) N/A 12/18/2014   Procedure: PERCUTANEOUS CORONARY STENT INTERVENTION (PCI-S);  Surgeon: Charolette Forward, MD;  Location: [facility] CATH LAB;  Service: Cardiovascular;  Laterality: N/A;  . POLYPECTOMY  12/01/2017   Procedure: POLYPECTOMY;  Surgeon: Lin Landsman, MD;  Location: [place]  [facility];  Service: Endoscopy;;  . POLYPECTOMY  12/29/2017   Procedure: POLYPECTOMY;  Surgeon: Lin Landsman, MD;  Location: [place];  Service: Endoscopy;;  . TOE AMPUTATION Right 2015   4th toe   Family History  Problem Relation Age of Onset  . Cancer Mother        Lung  . Cancer Father        Lung  . Diabetes Sister   . Diabetes Brother   . CAD Brother   . Hernia Son   .  Cancer Brother    Social History   Tobacco Use  . Smoking status: Former Smoker    Packs/day: 1.00    Years: 30.00    Pack years: 30.00    Types: Cigarettes    Start date: 08/31/1984    Last attempt to quit: 11/30/2014    Years since quitting: 3.7  . Smokeless tobacco: Never Used  Substance Use Topics  . Alcohol use: Yes    Comment: Rarely, twice a year.   No Known Allergies    Review of Systems  Constitutional: Positive for fatigue. Negative for appetite change and fever.  HENT: Negative for congestion, postnasal drip and sore throat.   Respiratory: Positive for cough (causing pain in chest when coughing). Negative for chest tightness and shortness of breath.   Cardiovascular: Negative for chest pain, palpitations and leg swelling.  Gastrointestinal: Negative for abdominal distention and abdominal pain.  Endocrine: Negative.   Genitourinary: Negative.   Musculoskeletal: Negative for back pain and neck pain.  Skin: Negative.   Allergic/Immunologic: Negative.   Neurological: Negative for dizziness, light-headedness and headaches.  Hematological: Negative for adenopathy. Does not bruise/bleed easily.  Psychiatric/Behavioral: Negative for dysphoric mood and sleep disturbance. The patient is not nervous/anxious.      Physical Exam  Constitutional: He is oriented to person, place, and time. He appears well-developed and well-nourished.  HENT:  Head: Normocephalic and atraumatic.  Neck: Normal range of motion. Neck supple. No JVD present.  Cardiovascular: Normal rate and regular rhythm.  Pulmonary/Chest: Effort normal. He has no wheezes. He has no rales.  Abdominal: Soft. He exhibits no distension. There is no abdominal tenderness.  Musculoskeletal:        General: Edema (trace pitting edema bilateral lower legs) present. No tenderness.  Neurological: He is alert and oriented to person, place, and time.  Skin: Skin is warm and dry.  Psychiatric: He has a normal mood and  affect. His behavior is normal. Thought content normal.  Nursing note and vitals reviewed.   Assessment & Plan:  1. Chronic heart failure with preserved ejection fraction- - NYHA class II - euvolemic today - Not adding salt to foods. Reminded to follow < 2000 mg sodium diet.  - weighing daily and weight has declined. reminded him to call for overnight weight gain of >2 pounds or weekly weight gain of >5 pounds - weight  - saw cardiologist ([place]) 10/21/16 - has increased his exercise and he was encouraged to continue his exercise - drank ~ 30 ounces of fluid yesterday - BNP 01/17/17 was 205.0 - PharmD reconciled medications with the patient  2. Diabetes- - checking 3x daily; fasting this morning at home was 147 - 11/13/16 A1c 7.0%; says that his most recent A1c was 5.0% - saw endocrinologist ([place]) 12/21/17  3. HTN - BP  - BMP on 05/03/18 reviewed and shows sodium 142, potassium 5.2, creatinine 7.59 and GFR 7 - says that he saw his PCP at  [facility] a few weeks ago  4. ESRD- - does home dialysis three times daily and for ~ 15 minutes each time - started process of being worked up for renal transplant - sees Dr. Juleen China later this month  Medication bottles were reviewed.

## 2018-09-17 ENCOUNTER — Encounter: Payer: Self-pay | Admitting: Emergency Medicine

## 2018-09-17 ENCOUNTER — Inpatient Hospital Stay
Admission: EM | Admit: 2018-09-17 | Discharge: 2018-09-19 | DRG: 291 | Disposition: A | Discharge status: Home / Self Care | Payer: Medicare Other | Attending: Internal Medicine | Admitting: Internal Medicine

## 2018-09-17 ENCOUNTER — Other Ambulatory Visit: Payer: Self-pay

## 2018-09-17 ENCOUNTER — Emergency Department: Payer: Medicare Other

## 2018-09-17 DIAGNOSIS — Z992 Dependence on renal dialysis: Secondary | ICD-10-CM

## 2018-09-17 DIAGNOSIS — K219 Gastro-esophageal reflux disease without esophagitis: Secondary | ICD-10-CM | POA: Diagnosis present

## 2018-09-17 DIAGNOSIS — J9601 Acute respiratory failure with hypoxia: Secondary | ICD-10-CM | POA: Diagnosis not present

## 2018-09-17 DIAGNOSIS — Z809 Family history of malignant neoplasm, unspecified: Secondary | ICD-10-CM

## 2018-09-17 DIAGNOSIS — I132 Hypertensive heart and chronic kidney disease with heart failure and with stage 5 chronic kidney disease, or end stage renal disease: Secondary | ICD-10-CM | POA: Diagnosis present

## 2018-09-17 DIAGNOSIS — I2510 Atherosclerotic heart disease of native coronary artery without angina pectoris: Secondary | ICD-10-CM | POA: Diagnosis present

## 2018-09-17 DIAGNOSIS — N186 End stage renal disease: Secondary | ICD-10-CM | POA: Diagnosis present

## 2018-09-17 DIAGNOSIS — E1151 Type 2 diabetes mellitus with diabetic peripheral angiopathy without gangrene: Secondary | ICD-10-CM | POA: Diagnosis present

## 2018-09-17 DIAGNOSIS — Z833 Family history of diabetes mellitus: Secondary | ICD-10-CM | POA: Diagnosis not present

## 2018-09-17 DIAGNOSIS — E1122 Type 2 diabetes mellitus with diabetic chronic kidney disease: Secondary | ICD-10-CM | POA: Diagnosis present

## 2018-09-17 DIAGNOSIS — R0603 Acute respiratory distress: Secondary | ICD-10-CM | POA: Diagnosis present

## 2018-09-17 DIAGNOSIS — I5033 Acute on chronic diastolic (congestive) heart failure: Secondary | ICD-10-CM | POA: Diagnosis present

## 2018-09-17 DIAGNOSIS — Z7982 Long term (current) use of aspirin: Secondary | ICD-10-CM | POA: Diagnosis not present

## 2018-09-17 DIAGNOSIS — M1990 Unspecified osteoarthritis, unspecified site: Secondary | ICD-10-CM | POA: Diagnosis present

## 2018-09-17 DIAGNOSIS — D631 Anemia in chronic kidney disease: Secondary | ICD-10-CM | POA: Diagnosis present

## 2018-09-17 DIAGNOSIS — Z8249 Family history of ischemic heart disease and other diseases of the circulatory system: Secondary | ICD-10-CM | POA: Diagnosis not present

## 2018-09-17 DIAGNOSIS — E7800 Pure hypercholesterolemia, unspecified: Secondary | ICD-10-CM | POA: Diagnosis present

## 2018-09-17 DIAGNOSIS — I252 Old myocardial infarction: Secondary | ICD-10-CM | POA: Diagnosis not present

## 2018-09-17 DIAGNOSIS — E11319 Type 2 diabetes mellitus with unspecified diabetic retinopathy without macular edema: Secondary | ICD-10-CM | POA: Diagnosis present

## 2018-09-17 DIAGNOSIS — J449 Chronic obstructive pulmonary disease, unspecified: Secondary | ICD-10-CM | POA: Diagnosis present

## 2018-09-17 DIAGNOSIS — N2581 Secondary hyperparathyroidism of renal origin: Secondary | ICD-10-CM | POA: Diagnosis present

## 2018-09-17 DIAGNOSIS — Z87891 Personal history of nicotine dependence: Secondary | ICD-10-CM | POA: Diagnosis not present

## 2018-09-17 DIAGNOSIS — R0602 Shortness of breath: Secondary | ICD-10-CM

## 2018-09-17 DIAGNOSIS — Z79899 Other long term (current) drug therapy: Secondary | ICD-10-CM

## 2018-09-17 DIAGNOSIS — Z955 Presence of coronary angioplasty implant and graft: Secondary | ICD-10-CM

## 2018-09-17 DIAGNOSIS — E1143 Type 2 diabetes mellitus with diabetic autonomic (poly)neuropathy: Secondary | ICD-10-CM | POA: Diagnosis present

## 2018-09-17 DIAGNOSIS — I4510 Unspecified right bundle-branch block: Secondary | ICD-10-CM | POA: Diagnosis present

## 2018-09-17 DIAGNOSIS — Z801 Family history of malignant neoplasm of trachea, bronchus and lung: Secondary | ICD-10-CM

## 2018-09-17 DIAGNOSIS — Z794 Long term (current) use of insulin: Secondary | ICD-10-CM | POA: Diagnosis not present

## 2018-09-17 DIAGNOSIS — K3184 Gastroparesis: Secondary | ICD-10-CM | POA: Diagnosis present

## 2018-09-17 HISTORY — DX: Acute respiratory failure with hypoxia: J96.01

## 2018-09-17 LAB — COMPREHENSIVE METABOLIC PANEL
ALK PHOS: 101 U/L (ref 38–126)
ALT: 15 U/L (ref 0–44)
AST: 17 U/L (ref 15–41)
Albumin: 3.4 g/dL — ABNORMAL LOW (ref 3.5–5.0)
Anion gap: 11 (ref 5–15)
BILIRUBIN TOTAL: 0.8 mg/dL (ref 0.3–1.2)
BUN: 56 mg/dL — AB (ref 6–20)
CALCIUM: 6.8 mg/dL — AB (ref 8.9–10.3)
CHLORIDE: 111 mmol/L (ref 98–111)
CO2: 19 mmol/L — ABNORMAL LOW (ref 22–32)
CREATININE: 6.89 mg/dL — AB (ref 0.61–1.24)
GFR calc Af Amer: 9 mL/min — ABNORMAL LOW (ref >60)
GFR calc non Af Amer: 8 mL/min — ABNORMAL LOW (ref >60)
Glucose, Bld: 136 mg/dL — ABNORMAL HIGH (ref 70–99)
Potassium: 4.4 mmol/L (ref 3.5–5.1)
Sodium: 141 mmol/L (ref 135–145)
Total Protein: 6.9 g/dL (ref 6.5–8.1)

## 2018-09-17 LAB — TROPONIN I

## 2018-09-17 LAB — CBC WITH DIFFERENTIAL/PLATELET
Abs Immature Granulocytes: 0.12 10*3/uL — ABNORMAL HIGH (ref 0.00–0.07)
Basophils Absolute: 0.1 10*3/uL (ref 0.0–0.1)
Basophils Relative: 1 %
Eosinophils Absolute: 0.5 10*3/uL (ref 0.0–0.5)
Eosinophils Relative: 4 %
HCT: 33.6 % — ABNORMAL LOW (ref 39.0–52.0)
Hemoglobin: 10.8 g/dL — ABNORMAL LOW (ref 13.0–17.0)
Immature Granulocytes: 1 %
LYMPHS PCT: 7 %
Lymphs Abs: 0.8 10*3/uL (ref 0.7–4.0)
MCH: 30.6 pg (ref 26.0–34.0)
MCHC: 32.1 g/dL (ref 30.0–36.0)
MCV: 95.2 fL (ref 80.0–100.0)
Monocytes Absolute: 0.7 10*3/uL (ref 0.1–1.0)
Monocytes Relative: 6 %
NRBC: 0 % (ref 0.0–0.2)
Neutro Abs: 9.4 10*3/uL — ABNORMAL HIGH (ref 1.7–7.7)
Neutrophils Relative %: 81 %
Platelets: 240 10*3/uL (ref 150–400)
RBC: 3.53 MIL/uL — AB (ref 4.22–5.81)
RDW: 14.9 % (ref 11.5–15.5)
WBC: 11.6 10*3/uL — AB (ref 4.0–10.5)

## 2018-09-17 LAB — INFLUENZA PANEL BY PCR (TYPE A & B)
Influenza A By PCR: NEGATIVE
Influenza B By PCR: NEGATIVE

## 2018-09-17 LAB — BRAIN NATRIURETIC PEPTIDE: B Natriuretic Peptide: 521 pg/mL — ABNORMAL HIGH (ref 0.0–100.0)

## 2018-09-17 LAB — CG4 I-STAT (LACTIC ACID): LACTIC ACID, VENOUS: 0.7 mmol/L (ref 0.5–1.9)

## 2018-09-17 MED ORDER — METHYLPREDNISOLONE SODIUM SUCC 125 MG IJ SOLR
125.0000 mg | Freq: Once | INTRAMUSCULAR | Status: AC
  Administered 2018-09-17: 125 mg via INTRAVENOUS
  Filled 2018-09-17: qty 2

## 2018-09-17 MED ORDER — ENALAPRILAT 1.25 MG/ML IV SOLN
0.6250 mg | Freq: Once | INTRAVENOUS | Status: AC
  Administered 2018-09-17: 0.625 mg via INTRAVENOUS
  Filled 2018-09-17: qty 2

## 2018-09-17 MED ORDER — FUROSEMIDE 10 MG/ML IJ SOLN
80.0000 mg | Freq: Once | INTRAMUSCULAR | Status: AC
  Administered 2018-09-17: 80 mg via INTRAVENOUS
  Filled 2018-09-17: qty 8

## 2018-09-17 MED ORDER — ALBUTEROL SULFATE (2.5 MG/3ML) 0.083% IN NEBU
5.0000 mg | INHALATION_SOLUTION | Freq: Once | RESPIRATORY_TRACT | Status: AC
  Administered 2018-09-17: 5 mg via RESPIRATORY_TRACT
  Filled 2018-09-17: qty 6

## 2018-09-17 NOTE — ED Notes (Signed)
ED TO INPATIENT HANDOFF REPORT  Name/Age/Gender Scott [name] 58 y.o. male  Code Status Code Status History    Date Active Date Inactive Code Status Order ID Comments User Context   03/09/2018 1206 03/12/2018 1712 Full Code [id number]  Henreitta Leber, MD Inpatient   01/17/2017 0338 01/20/2017 1834 Full Code [id number]  Harrie Foreman, MD ED   11/13/2016 1013 11/20/2016 1256 Full Code [id number]  Hillary Bow, MD ED   01/09/2015 1019 01/10/2015 1518 Full Code [id number]  Max Sane, MD Inpatient   12/18/2014 1620 12/19/2014 1627 Full Code [id number]  Charolette Forward, MD Inpatient   12/14/2014 1600 12/18/2014 1620 Full Code [id number]  Charolette Forward, MD Inpatient   12/13/2014 1324 12/14/2014 1600 Full Code [id number]  Charolette Forward, MD Inpatient      Home/SNF/Other Home  Chief Complaint Difficulty breathing  Level of Care/Admitting Diagnosis ED Disposition    ED Disposition Condition Comment   Admit  The patient appears reasonably stabilized for admission considering the current resources, flow, and capabilities available in the ED at this time, and I doubt any other [facility] requiring further screening and/or treatment in the ED prior to admission is  present.       Medical History Past Medical History:  Diagnosis Date  . Arthritis    "left arm; right leg" (12/13/2014)  . Asthma   . CHF (congestive heart failure) ([place])   . Chronic disease anemia    [name] 12/13/2014  . Chronic kidney disease (CKD), stage IV (severe) ([place])    [name] 12/13/2014... on dialysis  . Complication of anesthesia    unable to urinate after CAPD urgery  . Continuous ambulatory peritoneal dialysis status ([place])   . Coronary artery disease    [name] 12/13/2014  . Depression   . Dysrhythmia    patient unaware of irregular heartbeat  . GERD (gastroesophageal reflux disease)   . High cholesterol    [name] 12/13/2014  . Hypertension   . Non-Q wave myocardial infarction ([place])    [name] 12/13/2014  . PVD  (peripheral vascular disease) ([place])    [name] 12/13/2014  . Type II diabetes mellitus ([place])     Allergies No Known Allergies  IV Location/Drains/Wounds Patient Lines/Drains/Airways Status   Active Line/Drains/Airways    Name:   Placement date:   Placement time:   Site:   Days:   Peripheral IV 09/17/18 Left Antecubital   09/17/18    2205    Antecubital   less than 1   Peripheral IV 09/17/18 Left Forearm   09/17/18    2205    Forearm   less than 1   Fistula / Graft Right Forearm Arteriovenous fistula   04/07/17    1421    Forearm   528   Hemodialysis Catheter Right Internal jugular Double-lumen;Permanent   01/18/17    -    Internal jugular   607          Labs/Imaging Results for orders placed or performed during the hospital encounter of 09/17/18 (from the past 48 hour(s))  Comprehensive metabolic panel     Status: Abnormal   Collection Time: 09/17/18  9:56 PM  Result Value Ref Range   Sodium 141 135 - 145 mmol/L   Potassium 4.4 3.5 - 5.1 mmol/L   Chloride 111 98 - 111 mmol/L   CO2 19 (L) 22 - 32 mmol/L   Glucose, Bld 136 (H) 70 - 99 mg/dL   BUN 56 (H) 6 - 20 mg/dL  Creatinine, Ser 6.89 (H) 0.61 - 1.24 mg/dL   Calcium 6.8 (L) 8.9 - 10.3 mg/dL   Total Protein 6.9 6.5 - 8.1 g/dL   Albumin 3.4 (L) 3.5 - 5.0 g/dL   AST 17 15 - 41 U/L   ALT 15 0 - 44 U/L   Alkaline Phosphatase 101 38 - 126 U/L   Total Bilirubin 0.8 0.3 - 1.2 mg/dL   GFR calc non Af Amer 8 (L) >60 mL/min   GFR calc Af Amer 9 (L) >60 mL/min   Anion gap 11 5 - 15    Comment: Performed at [facility], [address] [place], [place] [address]  CBC with Differential     Status: Abnormal   Collection Time: 09/17/18  9:56 PM  Result Value Ref Range   WBC 11.6 (H) 4.0 - 10.5 K/uL   RBC 3.53 (L) 4.22 - 5.81 MIL/uL   Hemoglobin 10.8 (L) 13.0 - 17.0 g/dL   HCT 33.6 (L) 39.0 - 52.0 %   MCV 95.2 80.0 - 100.0 fL   MCH 30.6 26.0 - 34.0 pg   MCHC 32.1 30.0 - 36.0 g/dL   RDW 14.9 11.5 - 15.5 %   Platelets  240 150 - 400 K/uL   nRBC 0.0 0.0 - 0.2 %   Neutrophils Relative % 81 %   Neutro Abs 9.4 (H) 1.7 - 7.7 K/uL   Lymphocytes Relative 7 %   Lymphs Abs 0.8 0.7 - 4.0 K/uL   Monocytes Relative 6 %   Monocytes Absolute 0.7 0.1 - 1.0 K/uL   Eosinophils Relative 4 %   Eosinophils Absolute 0.5 0.0 - 0.5 K/uL   Basophils Relative 1 %   Basophils Absolute 0.1 0.0 - 0.1 K/uL   Immature Granulocytes 1 %   Abs Immature Granulocytes 0.12 (H) 0.00 - 0.07 K/uL    Comment: Performed at [facility] [address] [place], [place] [address]  Troponin I - Once     Status: None   Collection Time: 09/17/18  9:56 PM  Result Value Ref Range   Troponin I <0.03 <0.03 ng/mL    Comment: Performed at [facility], [address] [place], [place] [address]  Brain natriuretic peptide     Status: Abnormal   Collection Time: 09/17/18  9:56 PM  Result Value Ref Range   B Natriuretic Peptide 521.0 (H) 0.0 - 100.0 pg/mL    Comment: Performed at [facility], [address] [place], [place] [address]  Influenza panel by PCR (type A & B)     Status: None   Collection Time: 09/17/18  9:57 PM  Result Value Ref Range   Influenza A By PCR NEGATIVE NEGATIVE   Influenza B By PCR NEGATIVE NEGATIVE    Comment: (NOTE) The Xpert Xpress Flu assay is intended as an aid in the diagnosis of  influenza and should not be used as a sole basis for treatment.  This  assay is FDA approved for nasopharyngeal swab specimens only. Nasal  washings and aspirates are unacceptable for Xpert Xpress Flu testing. Performed at [facility], [place], [place] [address]   CG4 I-STAT (Lactic acid)     Status: None   Collection Time: 09/17/18 10:12 PM  Result Value Ref Range   Lactic Acid, Venous 0.70 0.5 - 1.9 mmol/L   Dg Chest Port 1 View  Result Date: 09/17/2018 CLINICAL DATA:  Initial evaluation for acute shortness of breath. EXAM: PORTABLE CHEST 1  VIEW COMPARISON:  Prior  radiograph from 05/03/2018. FINDINGS: Moderate cardiomegaly, stable.  Mediastinal silhouette normal. Lungs normally inflated. Diffuse pulmonary vascular and interstitial congestion, compatible with moderate diffuse pulmonary interstitial edema. Suspected small bilateral pleural effusions. Findings most consistent with CHF. No superimposed consolidative airspace disease. No pneumothorax. Osseous structures demonstrate no acute abnormality. IMPRESSION: Cardiomegaly with moderate diffuse pulmonary interstitial edema and small bilateral pleural effusions, compatible with acute CHF exacerbation. Electronically Signed   By: Jeannine Boga M.D.   On: 09/17/2018 22:40    Pending Labs Unresulted Labs (From admission, onward)    Start     Ordered   09/17/18 2156  Blood gas, venous  Once,   STAT     09/17/18 2156   09/17/18 2155  Culture, blood (routine x 2)  BLOOD CULTURE X 2,   STAT     09/17/18 2156          Vitals/Pain Today's Vitals   09/17/18 2215 09/17/18 2230 09/17/18 2245 09/17/18 2312  BP: (!) 156/86 (!) 163/89 (!) 164/94   Pulse: 76 73 74   Resp: (!) 22 16 (!) 21   Temp:      TempSrc:      SpO2: 99% 98% 99%   Weight:      Height:      PainSc:    0-No pain    Isolation Precautions No active isolations  Medications Medications  methylPREDNISolone sodium succinate (SOLU-MEDROL) 125 mg/2 mL injection 125 mg (125 mg Intravenous Given 09/17/18 2211)  albuterol (PROVENTIL) (2.5 MG/3ML) 0.083% nebulizer solution 5 mg (5 mg Nebulization Given 09/17/18 2219)  furosemide (LASIX) injection 80 mg (80 mg Intravenous Given 09/17/18 2305)  enalaprilat (VASOTEC) injection 0.625 mg (0.625 mg Intravenous Given 09/17/18 2303)    Mobility walks

## 2018-09-17 NOTE — H&P (Signed)
[facility] [place] NAME: Scott [name]    MR#:  [id number]  DATE OF BIRTH:  1961-05-13  DATE OF ADMISSION:  09/17/2018  PRIMARY CARE PHYSICIAN: [facility] [place]   REQUESTING/REFERRING PHYSICIAN: [name] Crumb, MD  CHIEF COMPLAINT:   Chief Complaint  Patient presents with  . Shortness of Breath    HISTORY OF PRESENT ILLNESS:  Scott [name]  is a 58 y.o. male with a known history of hypertension, chronic diastolic heart failure, ESRD on peritoneal dialysis, asthma, CAD, hyperlipidemia, type 2 diabetes who presented to the ED with shortness of breath that started yesterday.  He states he has been eating more salt than normal.  He took off an extra liter for his peritoneal dialysis this morning, which helped a little bit.  He states that his shortness of breath is worse with movement.  He endorses orthopnea and worsening lower extremity edema.  He denies any fevers or cough.  He denies chest pain.  In the ED, he was hypoxic to 81% on room air.  He was placed on 5 L O2, but continued to have pretty significant work of breathing.  He was then placed on BiPAP.  Initial blood pressure 219/107.  Labs are significant for BNP 521.  Chest x-ray with moderate diffuse pulmonary interstitial edema and small bilateral pleural effusions, compatible with acute CHF exacerbation. He was given Lasix 80 mg IV x1, as he produces urine.  Hospitalists were called for admission.  PAST MEDICAL HISTORY:   Past Medical History:  Diagnosis Date  . Arthritis    "left arm; right leg" (12/13/2014)  . Asthma   . CHF (congestive heart failure) ([place])   . Chronic disease anemia    [name] 12/13/2014  . Chronic kidney disease (CKD), stage IV (severe) ([place])    [name] 12/13/2014... on dialysis  . Complication of anesthesia    unable to urinate after CAPD urgery  . Continuous ambulatory peritoneal dialysis status ([place])   . Coronary artery disease    [name]  12/13/2014  . Depression   . Dysrhythmia    patient unaware of irregular heartbeat  . GERD (gastroesophageal reflux disease)   . High cholesterol    [name] 12/13/2014  . Hypertension   . Non-Q wave myocardial infarction ([place])    [name] 12/13/2014  . PVD (peripheral vascular disease) ([place])    [name] 12/13/2014  . Type II diabetes mellitus ([place])     PAST SURGICAL HISTORY:   Past Surgical History:  Procedure Laterality Date  . AV FISTULA PLACEMENT Right 04/07/2017   Procedure: ARTERIOVENOUS (AV) FISTULA CREATION ( BRACHIALCEPHALIC );  Surgeon: Katha Cabal, MD;  Location: [facility] ORS;  Service: Vascular;  Laterality: Right;  . CAPD INSERTION N/A 04/07/2017   Procedure: LAPAROSCOPIC INSERTION CONTINUOUS AMBULATORY PERITONEAL DIALYSIS  (CAPD) CATHETER;  Surgeon: Katha Cabal, MD;  Location: [facility] ORS;  Service: Vascular;  Laterality: N/A;  . CARDIAC CATHETERIZATION  11/2014  . COLONOSCOPY WITH PROPOFOL N/A 12/01/2017   Procedure: COLONOSCOPY WITH PROPOFOL;  Surgeon: Lin Landsman, MD;  Location: [place];  Service: Endoscopy;  Laterality: N/A;  Diabetic - insulin  . COLONOSCOPY WITH PROPOFOL N/A 12/29/2017   Procedure: COLONOSCOPY WITH PROPOFOL;  Surgeon: Lin Landsman, MD;  Location: [facility];  Service: Endoscopy;  Laterality: N/A;  . DIALYSIS/PERMA CATHETER INSERTION N/A 01/18/2017   Procedure: Dialysis/Perma Catheter Insertion;  Surgeon: Algernon Huxley, MD;  Location: [facility] CV LAB;  Service: Cardiovascular;  Laterality: N/A;  . DIALYSIS/PERMA CATHETER REMOVAL N/A 07/26/2017   Procedure: DIALYSIS/PERMA CATHETER REMOVAL;  Surgeon: Algernon Huxley, MD;  Location: [place] CV LAB;  Service: Cardiovascular;  Laterality: N/A;  . INCISION AND DRAINAGE OF WOUND Right ~ 10/2014   "4th toe foot"  . PERCUTANEOUS CORONARY STENT INTERVENTION (PCI-S) N/A 12/14/2014   Procedure: PERCUTANEOUS CORONARY STENT INTERVENTION (PCI-S);  Surgeon: Charolette Forward, MD;   Location: [facility] CATH LAB;  Service: Cardiovascular;  Laterality: N/A;  . PERCUTANEOUS CORONARY STENT INTERVENTION (PCI-S) N/A 12/18/2014   Procedure: PERCUTANEOUS CORONARY STENT INTERVENTION (PCI-S);  Surgeon: Charolette Forward, MD;  Location: [facility] CATH LAB;  Service: Cardiovascular;  Laterality: N/A;  . POLYPECTOMY  12/01/2017   Procedure: POLYPECTOMY;  Surgeon: Lin Landsman, MD;  Location: [place];  Service: Endoscopy;;  . POLYPECTOMY  12/29/2017   Procedure: POLYPECTOMY;  Surgeon: Lin Landsman, MD;  Location: [facility];  Service: Endoscopy;;  . TOE AMPUTATION Right 2015   4th toe    SOCIAL HISTORY:   Social History   Tobacco Use  . Smoking status: Former Smoker    Packs/day: 1.00    Years: 30.00    Pack years: 30.00    Types: Cigarettes    Start date: 08/31/1984    Last attempt to quit: 11/30/2014    Years since quitting: 3.8  . Smokeless tobacco: Never Used  Substance Use Topics  . Alcohol use: Yes    Comment: Rarely, twice a year.    FAMILY HISTORY:   Family History  Problem Relation Age of Onset  . Cancer Mother        Lung  . Cancer Father        Lung  . Diabetes Sister   . Diabetes Brother   . CAD Brother   . Hernia Son   . Cancer Brother     DRUG ALLERGIES:  No Known Allergies  REVIEW OF SYSTEMS:   Review of Systems  Constitutional: Negative for chills and fever.  HENT: Negative for congestion and sore throat.   Eyes: Negative for blurred vision and double vision.  Respiratory: Positive for shortness of breath. Negative for cough.   Cardiovascular: Positive for orthopnea and leg swelling. Negative for chest pain.  Gastrointestinal: Negative for abdominal pain, nausea and vomiting.  Genitourinary: Negative for dysuria and urgency.  Musculoskeletal: Negative for back pain and neck pain.  Neurological: Negative for dizziness and headaches.  Psychiatric/Behavioral: Negative for depression. The patient is not nervous/anxious.      MEDICATIONS AT HOME:   Prior to Admission medications   Medication Sig Start Date End Date Taking? Authorizing Provider  amLODipine (NORVASC) 10 MG tablet Take 10 mg by mouth daily.   Yes [provider]  aspirin EC 81 MG tablet Take 1 tablet (81 mg total) by mouth daily. 01/10/15  Yes Gladstone Lighter, MD  prasugrel (EFFIENT) 10 MG TABS tablet Take 10 mg by mouth daily.   Yes [provider]  albuterol (PROVENTIL HFA;VENTOLIN HFA) 108 (90 BASE) MCG/ACT inhaler Inhale 2 puffs into the lungs every 6 (six) hours as needed for wheezing or shortness of breath. 01/10/15   Gladstone Lighter, MD  B Complex-C-Folic Acid (DIALYVITE 086) 0.8 MG TABS Take 1 tablet by mouth daily. 10/25/17   [provider]  calcitRIOL (ROCALTROL) 0.25 MCG capsule Take 0.25 mcg by mouth daily.    [provider]  calcitRIOL (ROCALTROL) 0.5 MCG capsule Take 1 capsule by mouth daily. 02/28/18  [provider]  calcium acetate (PHOSLO) 667 MG capsule Take 2,001 mg by mouth 3 (three) times daily with meals. Takes 1-2 caps if he has snacks 06/08/17   [provider]  carvedilol (COREG) 25 MG tablet Take 1 tablet (25 mg total) by mouth 2 (two) times daily with a meal. 08/30/17   Alisa Graff, FNP  Cholecalciferol (VITAMIN D) 2000 units tablet Take 2,000 Units by mouth daily. Unsure how many units    [provider]  furosemide (LASIX) 40 MG tablet Take 40 mg by mouth daily.     [provider]  gentamicin cream (GARAMYCIN) 0.1 % Apply 1 application topically daily. 03/02/18   [provider]  hydrALAZINE (APRESOLINE) 25 MG tablet Take 1 tablet (25 mg total) by mouth 3 (three) times daily. Patient taking differently: Take 25 mg by mouth 3 (three) times daily. Pt states he only takes once a day 03/12/18 06/10/18  Fritzi Mandes, MD  HYDROcodone-acetaminophen (NORCO/VICODIN) 5-325 MG tablet Take 1-2 tablets by mouth every 4 (four) hours as needed for  moderate pain. 05/03/18   Hinda Kehr, MD  insulin aspart protamine- aspart (NOVOLOG MIX 70/30) (70-30) 100 UNIT/ML injection Inject 0.25 mLs (25 Units total) into the skin 2 (two) times daily with a meal. Patient taking differently: Inject 20-25 Units into the skin 2 (two) times daily with a meal.  03/12/18   Fritzi Mandes, MD  nitroGLYCERIN (NITROSTAT) 0.4 MG SL tablet Place 0.4 mg under the tongue every 5 (five) minutes as needed for chest pain. Pt needs new Rx. Bottle has expried    [provider]  omeprazole (PRILOSEC) 20 MG capsule Take 1 capsule (20 mg total) by mouth daily. 01/10/15   Gladstone Lighter, MD  ONE TOUCH ULTRA TEST test strip  11/25/17   [provider]      VITAL SIGNS:  Blood pressure (!) 164/94, pulse 74, temperature 97.7 F (36.5 C), temperature source Oral, resp. rate (!) 21, height 5\' 9"  (1.753 m), weight 108.9 kg, SpO2 99 %.  PHYSICAL EXAMINATION:  Physical Exam  GENERAL:  58 y.o.-year-old patient lying in the bed with no acute distress.  EYES: Pupils equal, round, reactive to light and accommodation. No scleral icterus. Extraocular muscles intact.  HEENT: Head atraumatic, normocephalic. Oropharynx and nasopharynx clear.  NECK:  Supple, no jugular venous distention. No thyroid enlargement, no tenderness.  LUNGS: +bibasilar crackles present. BiPAP in place. CARDIOVASCULAR: RRR, S1, S2 normal. No murmurs, rubs, or gallops.  ABDOMEN: Soft, nontender, nondistended. Bowel sounds present. No organomegaly or mass.  EXTREMITIES: 2+ pitting edema. No cyanosis or clubbing.  NEUROLOGIC: Cranial nerves II through XII are intact. Muscle strength 5/5 in all extremities. Sensation intact. Gait not checked.  PSYCHIATRIC: The patient is alert and oriented x 3.  SKIN: No obvious rash, lesion, or ulcer.   LABORATORY PANEL:   CBC Recent Labs  Lab 09/17/18 2156  WBC 11.6*  HGB 10.8*  HCT 33.6*  PLT 240    ------------------------------------------------------------------------------------------------------------------  Chemistries  Recent Labs  Lab 09/17/18 2156  NA 141  K 4.4  CL 111  CO2 19*  GLUCOSE 136*  BUN 56*  CREATININE 6.89*  CALCIUM 6.8*  AST 17  ALT 15  ALKPHOS 101  BILITOT 0.8   ------------------------------------------------------------------------------------------------------------------  Cardiac Enzymes Recent Labs  Lab 09/17/18 2156  TROPONINI <0.03   ------------------------------------------------------------------------------------------------------------------  RADIOLOGY:  Dg Chest Port 1 View  Result Date: 09/17/2018 CLINICAL DATA:  Initial evaluation for acute shortness of breath. EXAM:  PORTABLE CHEST 1 VIEW COMPARISON:  Prior radiograph from 05/03/2018. FINDINGS: Moderate cardiomegaly, stable.  Mediastinal silhouette normal. Lungs normally inflated. Diffuse pulmonary vascular and interstitial congestion, compatible with moderate diffuse pulmonary interstitial edema. Suspected small bilateral pleural effusions. Findings most consistent with CHF. No superimposed consolidative airspace disease. No pneumothorax. Osseous structures demonstrate no acute abnormality. IMPRESSION: Cardiomegaly with moderate diffuse pulmonary interstitial edema and small bilateral pleural effusions, compatible with acute CHF exacerbation. Electronically Signed   By: Jeannine Boga M.D.   On: 09/17/2018 22:40      IMPRESSION AND PLAN:   Acute hypoxic respiratory failure secondary to acute on chronic diastolic congestive heart failure- patient admits to increased sodium intake over the last couple of weeks.  Requiring BiPAP due to increased work of breathing and desaturations. -Lasix 80 mg IV 3 times daily, as patient makes urine -Obtain ECHO -Nephrology consult  -Strict I/O -Daily weights -Wean from BiPAP to nasal cannula as able  ESRD on peritoneal dialysis-  appears volume overloaded on exam. -Nephrology consult  Hypertension- BPs elevated in the ED, likely due to volume overload state. -Continue home norvasc, hydralazine, coreg  CAD s/p stent x 2- stable, no active chest pain -Continue aspirin, effient, coreg  Type 2 diabetes- stable -Lantus 20 units nightly -Moderate SSI  Anemia of chronic kidney disease- hemoglobin at baseline -Monitor  Tobacco abuse -Nicotine patch as needed -Tobacco cessation counseling performed by me  All the records are reviewed and case discussed with ED provider. Management plans discussed with the patient, family and they are in agreement.  CODE STATUS: Full  TOTAL TIME TAKING CARE OF THIS PATIENT: 45 minutes.    Berna Spare Mayo M.D on 09/17/2018 at 11:39 PM  Between 7am to 6pm - Pager - [phone]  After 6pm go to [email or web address] - Technical brewer [facility]  Office  [phone]  CC: Primary care physician; [employer]   Note: This dictation was prepared with Diplomatic Services operational officer dictation along with smaller Company secretary. Any transcriptional errors that result from this process are unintentional.

## 2018-09-17 NOTE — Progress Notes (Signed)
VBG results given to Dr Burlene Arnt [id number] results not reporting over from sunquest ph 7.30 pCO2 40 pO2 63 BE-6.3 HCO3 19.7

## 2018-09-17 NOTE — ED Provider Notes (Addendum)
[facility] Emergency Department Provider Note  ____________________________________________   I have reviewed the triage vital signs and the nursing notes. Where available I have reviewed prior notes and, if possible and indicated, outside hospital notes.    HISTORY  Chief Complaint Shortness of Breath    HPI Scott [name] is a 58 y.o. male who presents today complaining of feeling short of breath.  Patient has multiple medical problems including CHF, he is a stage I diastolic dysfunction with a EF of 50 to 55% according to echo done here at 03/09/2018, also has a history of CAD, peritoneal dialysis, on home inhalers as well he states his for COPD, states that his been short of breath since yesterday.  He did extra dialysis today, thinks he took off an extra liter.  However, he has leg swelling and feels very short of breath.  Patient is working hard to breathe, somewhat limited history.  Level 5 chart caveat; no further history available due to patient status.  He is able to tell me that he is having no chest pain or abdominal pain, no fever, no chills no cough, just shortness of breath And does respond he states to Lasix does still make urine if needed.     Past Medical History:  Diagnosis Date  . Arthritis    "left arm; right leg" (12/13/2014)  . Asthma   . CHF (congestive heart failure) ([place])   . Chronic disease anemia    [name] 12/13/2014  . Chronic kidney disease (CKD), stage IV (severe) ([place])    [name] 12/13/2014... on dialysis  . Complication of anesthesia    unable to urinate after CAPD urgery  . Continuous ambulatory peritoneal dialysis status ([place])   . Coronary artery disease    [name] 12/13/2014  . Depression   . Dysrhythmia    patient unaware of irregular heartbeat  . GERD (gastroesophageal reflux disease)   . High cholesterol    [name] 12/13/2014  . Hypertension   . Non-Q wave myocardial infarction ([place])    [name] 12/13/2014  . PVD  (peripheral vascular disease) ([place])    [name] 12/13/2014  . Type II diabetes mellitus [facility])     Patient Active Problem List   Diagnosis Date Noted  . History of colonic polyps   . Positive fecal occult blood test   . End stage renal disease ([place]) 11/25/2016  . Tobacco abuse 11/25/2016  . CHF (congestive heart failure) ([place]) 11/13/2016  . Diabetes ([place]) 09/17/2015  . Decreased renal function 05/23/2015  . GERD (gastroesophageal reflux disease) 04/04/2015  . Hypertension 02/26/2015    Past Surgical History:  Procedure Laterality Date  . AV FISTULA PLACEMENT Right 04/07/2017   Procedure: ARTERIOVENOUS (AV) FISTULA CREATION ( BRACHIALCEPHALIC );  Surgeon: Katha Cabal, MD;  Location: [facility] ORS;  Service: Vascular;  Laterality: Right;  . CAPD INSERTION N/A 04/07/2017   Procedure: LAPAROSCOPIC INSERTION CONTINUOUS AMBULATORY PERITONEAL DIALYSIS  (CAPD) CATHETER;  Surgeon: Katha Cabal, MD;  Location: [facility] ORS;  Service: Vascular;  Laterality: N/A;  . CARDIAC CATHETERIZATION  11/2014  . COLONOSCOPY WITH PROPOFOL N/A 12/01/2017   Procedure: COLONOSCOPY WITH PROPOFOL;  Surgeon: Lin Landsman, MD;  Location: [place];  Service: Endoscopy;  Laterality: N/A;  Diabetic - insulin  . COLONOSCOPY WITH PROPOFOL N/A 12/29/2017   Procedure: COLONOSCOPY WITH PROPOFOL;  Surgeon: Lin Landsman, MD;  Location: [place];  Service: Endoscopy;  Laterality: N/A;  . DIALYSIS/PERMA CATHETER INSERTION N/A 01/18/2017  Procedure: Dialysis/Perma Catheter Insertion;  Surgeon: Algernon Huxley, MD;  Location: [place] CV LAB;  Service: Cardiovascular;  Laterality: N/A;  . DIALYSIS/PERMA CATHETER REMOVAL N/A 07/26/2017   Procedure: DIALYSIS/PERMA CATHETER REMOVAL;  Surgeon: Algernon Huxley, MD;  Location: [place] CV LAB;  Service: Cardiovascular;  Laterality: N/A;  . INCISION AND DRAINAGE OF WOUND Right ~ 10/2014   "4th toe foot"  . PERCUTANEOUS CORONARY STENT INTERVENTION  (PCI-S) N/A 12/14/2014   Procedure: PERCUTANEOUS CORONARY STENT INTERVENTION (PCI-S);  Surgeon: Charolette Forward, MD;  Location: [facility] CATH LAB;  Service: Cardiovascular;  Laterality: N/A;  . PERCUTANEOUS CORONARY STENT INTERVENTION (PCI-S) N/A 12/18/2014   Procedure: PERCUTANEOUS CORONARY STENT INTERVENTION (PCI-S);  Surgeon: Charolette Forward, MD;  Location: [facility] CATH LAB;  Service: Cardiovascular;  Laterality: N/A;  . POLYPECTOMY  12/01/2017   Procedure: POLYPECTOMY;  Surgeon: Lin Landsman, MD;  Location: [place];  Service: Endoscopy;;  . POLYPECTOMY  12/29/2017   Procedure: POLYPECTOMY;  Surgeon: Lin Landsman, MD;  Location: [place];  Service: Endoscopy;;  . TOE AMPUTATION Right 2015   4th toe    Prior to Admission medications   Medication Sig Start Date End Date Taking? Authorizing Provider  albuterol (PROVENTIL HFA;VENTOLIN HFA) 108 (90 BASE) MCG/ACT inhaler Inhale 2 puffs into the lungs every 6 (six) hours as needed for wheezing or shortness of breath. 01/10/15   Gladstone Lighter, MD  amLODipine (NORVASC) 10 MG tablet Take 10 mg by mouth daily.    [provider]  aspirin EC 81 MG tablet Take 1 tablet (81 mg total) by mouth daily. 01/10/15   Gladstone Lighter, MD  B Complex-C-Folic Acid (DIALYVITE 818) 0.8 MG TABS Take 1 tablet by mouth daily. 10/25/17   [provider]  calcitRIOL (ROCALTROL) 0.25 MCG capsule Take 0.25 mcg by mouth daily.    [provider]  calcitRIOL (ROCALTROL) 0.5 MCG capsule Take 1 capsule by mouth daily. 02/28/18   [provider]  calcium acetate (PHOSLO) 667 MG capsule Take 2,001 mg by mouth 3 (three) times daily with meals. Takes 1-2 caps if he has snacks 06/08/17   [provider]  carvedilol (COREG) 25 MG tablet Take 1 tablet (25 mg total) by mouth 2 (two) times daily with a meal. 08/30/17   Alisa Graff, FNP  Cholecalciferol (VITAMIN D) 2000 units tablet Take 2,000 Units by mouth daily. Unsure  how many units    [provider]  furosemide (LASIX) 40 MG tablet Take 40 mg by mouth daily.     [provider]  gentamicin cream (GARAMYCIN) 0.1 % Apply 1 application topically daily. 03/02/18   [provider]  hydrALAZINE (APRESOLINE) 25 MG tablet Take 1 tablet (25 mg total) by mouth 3 (three) times daily. Patient taking differently: Take 25 mg by mouth 3 (three) times daily. Pt states he only takes once a day 03/12/18 06/10/18  Fritzi Mandes, MD  HYDROcodone-acetaminophen (NORCO/VICODIN) 5-325 MG tablet Take 1-2 tablets by mouth every 4 (four) hours as needed for moderate pain. 05/03/18   Hinda Kehr, MD  insulin aspart protamine- aspart (NOVOLOG MIX 70/30) (70-30) 100 UNIT/ML injection Inject 0.25 mLs (25 Units total) into the skin 2 (two) times daily with a meal. Patient taking differently: Inject 20-25 Units into the skin 2 (two) times daily with a meal.  03/12/18   Fritzi Mandes, MD  lidocaine (LIDODERM) 5 % Place 1 patch onto the skin every 12 (twelve) hours. Remove & Discard patch within 12  hours or as directed by MD.  [place] Proud the patch off for 12 hours before applying a new one. 05/03/18 05/03/19  Hinda Kehr, MD  nitroGLYCERIN (NITROSTAT) 0.4 MG SL tablet Place 0.4 mg under the tongue every 5 (five) minutes as needed for chest pain. Pt needs new Rx. Bottle has expried    [provider]  omeprazole (PRILOSEC) 20 MG capsule Take 1 capsule (20 mg total) by mouth daily. 01/10/15   Gladstone Lighter, MD  ONE TOUCH ULTRA TEST test strip  11/25/17   [provider]  prasugrel (EFFIENT) 10 MG TABS tablet Take 10 mg by mouth daily.    [provider]    Allergies Patient has no known allergies.  Family History  Problem Relation Age of Onset  . Cancer Mother        Lung  . Cancer Father        Lung  . Diabetes Sister   . Diabetes Brother   . CAD Brother   . Hernia Son   . Cancer Brother     Social History Social History   Tobacco Use  .  Smoking status: Former Smoker    Packs/day: 1.00    Years: 30.00    Pack years: 30.00    Types: Cigarettes    Start date: 08/31/1984    Last attempt to quit: 11/30/2014    Years since quitting: 3.8  . Smokeless tobacco: Never Used  Substance Use Topics  . Alcohol use: Yes    Comment: Rarely, twice a year.  . Drug use: No    Review of Systems Constitutional: No fever/chills Eyes: No visual changes. ENT: No sore throat. No stiff neck no neck pain Cardiovascular: Denies chest pain. Respiratory: + shortness of breath. Gastrointestinal:   no vomiting.  No diarrhea.  No constipation. Genitourinary: Negative for dysuria. Musculoskeletal: + lower extremity swelling Skin: Negative for rash. Neurological: Negative for severe headaches, focal weakness or numbness.   ____________________________________________   PHYSICAL EXAM:  VITAL SIGNS: ED Triage Vitals  Enc Vitals Group     BP --      Pulse Rate 09/17/18 2152 81     Resp 09/17/18 2152 (!) 28     Temp 09/17/18 2152 97.7 F (36.5 C)     Temp Source 09/17/18 2152 Oral     SpO2 09/17/18 2152 (!) 81 %     Weight 09/17/18 2154 240 lb (108.9 kg)     Height 09/17/18 2154 5\' 9"  (1.753 m)     Head Circumference --      Peak Flow --      Pain Score 09/17/18 2154 0     Pain Loc --      Pain Edu? --      Excl. in [place]? --     Constitutional: Patient in acute respiratory distress, speaks in 3 word dyspnea. Eyes: Conjunctivae are normal Head: Atraumatic HEENT: No congestion/rhinnorhea. Mucous membranes are moist.  Oropharynx non-erythematous Neck:   Nontender with no meningismus, no masses, no stridor Cardiovascular: Normal rate, regular rhythm. Grossly normal heart sounds.  Good peripheral circulation. Respiratory: Fatigue, increased work of breathing, diminished in the bases no obvious rales or rhonchi or wheeze, breath sounds are present bilaterally Abdominal: Soft and nontender. No distention. No guarding no rebound, dialysis  port noted Back:  There is no focal tenderness or step off.  there is no midline tenderness there are no lesions noted. there is no CVA tenderness Musculoskeletal: No lower extremity tenderness, no  upper extremity tenderness. No joint effusions, no DVT signs strong distal pulses bilateral pitting symmetric edema Neurologic:  Normal speech and language. No gross focal neurologic deficits are appreciated.  Skin:  Skin is warm, dry and intact. No rash noted. Psychiatric: Mood and affect are anxious. Speech and behavior are normal.  ____________________________________________   LABS (all labs ordered are listed, but only abnormal results are displayed)  Labs Reviewed  CULTURE, BLOOD (ROUTINE X 2)  CULTURE, BLOOD (ROUTINE X 2)  COMPREHENSIVE METABOLIC PANEL  CBC WITH DIFFERENTIAL/PLATELET  TROPONIN I  BRAIN NATRIURETIC PEPTIDE  BLOOD GAS, VENOUS  INFLUENZA PANEL BY PCR (TYPE A & B)    Pertinent labs  results that were available during my care of the patient were reviewed by me and considered in my medical decision making (see chart for details). ____________________________________________  EKG  I personally interpreted any EKGs ordered by me or triage Sinus rhythm, rate 80 bpm, no acute ST elevation or depression, normal axis non-specific ST changes, right bundle branch block noted _________________________________________  RADIOLOGY  Pertinent labs & imaging results that were available during my care of the patient were reviewed by me and considered in my medical decision making (see chart for details). If possible, patient and/or family made aware of any abnormal findings.  No results found. ____________________________________________    PROCEDURES  Procedure(s) performed: None  Procedures  Critical Care performed: CRITICAL CARE Performed by: Schuyler Amor   Total critical care time: 38 minutes  Critical care time was exclusive of separately billable procedures  and treating other patients.  Critical care was necessary to treat or prevent imminent or life-threatening deterioration.  Critical care was time spent personally by me on the following activities: development of treatment plan with patient and/or surrogate as well as nursing, discussions with consultants, evaluation of patient's response to treatment, examination of patient, obtaining history from patient or surrogate, ordering and performing treatments and interventions, ordering and review of laboratory studies, ordering and review of radiographic studies, pulse oximetry and re-evaluation of patient's condition.   ____________________________________________   INITIAL IMPRESSION / ASSESSMENT AND PLAN / ED COURSE  Pertinent labs & imaging results that were available during my care of the patient were reviewed by me and considered in my medical decision making (see chart for details).  Shin in significant respiratory distress initially with low sats, I placed him immediately on oxygen sats are now in the mid 90s, we will place him on BiPAP for the fatigue, not because he does not respond to oxygen, and I think that will help him.  He has been on it before.  Patient has a multiple possible etiologies for his shortness of breath including infectious we will send the flu get chest x-ray get blood cultures, COPD, will give him albuterol, as well as Solu-Medrol, and fluid overload either from dialysis or from his heart failure, we will evaluate chest x-ray to see whether or not he needs Lasix.  There is likely some combination to all of these, at this time I most favor fluid overload.  Ever, we will do what we can to evaluate that.  I also send a troponin, and a BNP.  If he has a baseline BNP it may help guide [place].  ----------------------------------------- 10:25 PM on 09/17/2018 -----------------------------------------  Much more comfortable on BiPAP, heart rate is come down, blood pressures in the  150s at this time sats are good, getting his neb feels much better much less work of breathing.  ____________________________________________   FINAL CLINICAL IMPRESSION(S) / ED DIAGNOSES  Final diagnoses:  SOB (shortness of breath)      This chart was dictated using voice recognition software.  Despite best efforts to proofread,  errors can occur which can change meaning.      Schuyler Amor, MD 09/17/18 2203    Schuyler Amor, MD 09/17/18 2225    Schuyler Amor, MD 10/11/18 1114

## 2018-09-17 NOTE — ED Triage Notes (Addendum)
Pt to room 17 via wheelchair. Reports shortness of breath since yesterday and noted to be grunting with respirations. Pt with hx of CHF and is on peritoneal dialysis and completed a complete course of dialysis tonight prior to coming in.

## 2018-09-18 ENCOUNTER — Inpatient Hospital Stay
Admit: 2018-09-18 | Discharge: 2018-09-18 | Disposition: A | Discharge status: Home / Self Care | Payer: Medicare Other | Attending: Internal Medicine | Admitting: Internal Medicine

## 2018-09-18 ENCOUNTER — Other Ambulatory Visit: Payer: Self-pay

## 2018-09-18 DIAGNOSIS — J9601 Acute respiratory failure with hypoxia: Secondary | ICD-10-CM

## 2018-09-18 LAB — MRSA PCR SCREENING: MRSA by PCR: NEGATIVE

## 2018-09-18 LAB — MAGNESIUM: Magnesium: 2 mg/dL (ref 1.7–2.4)

## 2018-09-18 LAB — CBC
HCT: 32.9 % — ABNORMAL LOW (ref 39.0–52.0)
Hemoglobin: 10.2 g/dL — ABNORMAL LOW (ref 13.0–17.0)
MCH: 30.2 pg (ref 26.0–34.0)
MCHC: 31 g/dL (ref 30.0–36.0)
MCV: 97.3 fL (ref 80.0–100.0)
Platelets: 226 10*3/uL (ref 150–400)
RBC: 3.38 MIL/uL — ABNORMAL LOW (ref 4.22–5.81)
RDW: 14.8 % (ref 11.5–15.5)
WBC: 9.5 10*3/uL (ref 4.0–10.5)
nRBC: 0 % (ref 0.0–0.2)

## 2018-09-18 LAB — BASIC METABOLIC PANEL
Anion gap: 9 (ref 5–15)
BUN: 63 mg/dL — AB (ref 6–20)
CHLORIDE: 110 mmol/L (ref 98–111)
CO2: 19 mmol/L — ABNORMAL LOW (ref 22–32)
Calcium: 6.6 mg/dL — ABNORMAL LOW (ref 8.9–10.3)
Creatinine, Ser: 7.04 mg/dL — ABNORMAL HIGH (ref 0.61–1.24)
GFR calc Af Amer: 9 mL/min — ABNORMAL LOW (ref >60)
GFR, EST NON AFRICAN AMERICAN: 8 mL/min — AB (ref >60)
GLUCOSE: 186 mg/dL — AB (ref 70–99)
Potassium: 4.9 mmol/L (ref 3.5–5.1)
Sodium: 138 mmol/L (ref 135–145)

## 2018-09-18 LAB — GLUCOSE, CAPILLARY
GLUCOSE-CAPILLARY: 167 mg/dL — AB (ref 70–99)
Glucose-Capillary: 139 mg/dL — ABNORMAL HIGH (ref 70–99)
Glucose-Capillary: 139 mg/dL — ABNORMAL HIGH (ref 70–99)
Glucose-Capillary: 192 mg/dL — ABNORMAL HIGH (ref 70–99)
Glucose-Capillary: 251 mg/dL — ABNORMAL HIGH (ref 70–99)

## 2018-09-18 LAB — PHOSPHORUS
Phosphorus: 6.7 mg/dL — ABNORMAL HIGH (ref 2.5–4.6)
Phosphorus: 6.9 mg/dL — ABNORMAL HIGH (ref 2.5–4.6)

## 2018-09-18 MED ORDER — ALBUTEROL SULFATE (2.5 MG/3ML) 0.083% IN NEBU: 2.5000 mg | INHALATION_SOLUTION | Freq: Four times a day (QID) | RESPIRATORY_TRACT | Status: DC | PRN

## 2018-09-18 MED ORDER — HYDROCODONE-ACETAMINOPHEN 5-325 MG PO TABS
1.0000 | ORAL_TABLET | ORAL | Status: DC | PRN
  Administered 2018-09-18: 2 via ORAL
  Filled 2018-09-18: qty 2

## 2018-09-18 MED ORDER — ONDANSETRON HCL 4 MG/2ML IJ SOLN: 4.0000 mg | Freq: Four times a day (QID) | INTRAMUSCULAR | Status: DC | PRN

## 2018-09-18 MED ORDER — FUROSEMIDE 10 MG/ML IJ SOLN
80.0000 mg | Freq: Three times a day (TID) | INTRAMUSCULAR | Status: DC
  Administered 2018-09-18 – 2018-09-19 (×3): 80 mg via INTRAVENOUS
  Filled 2018-09-18 (×3): qty 8

## 2018-09-18 MED ORDER — VITAMIN D 25 MCG (1000 UNIT) PO TABS
2000.0000 [IU] | ORAL_TABLET | Freq: Every day | ORAL | Status: DC
  Administered 2018-09-18 – 2018-09-19 (×2): 2000 [IU] via ORAL
  Filled 2018-09-18 (×2): qty 2

## 2018-09-18 MED ORDER — ASPIRIN EC 81 MG PO TBEC
81.0000 mg | DELAYED_RELEASE_TABLET | Freq: Every day | ORAL | Status: DC
  Administered 2018-09-18 – 2018-09-19 (×2): 81 mg via ORAL
  Filled 2018-09-18 (×2): qty 1

## 2018-09-18 MED ORDER — CARVEDILOL 12.5 MG PO TABS
25.0000 mg | ORAL_TABLET | Freq: Two times a day (BID) | ORAL | Status: DC
  Administered 2018-09-18 – 2018-09-19 (×2): 25 mg via ORAL
  Filled 2018-09-18 (×2): qty 2

## 2018-09-18 MED ORDER — GENTAMICIN SULFATE 0.1 % EX CREA
1.0000 "application " | TOPICAL_CREAM | Freq: Every day | CUTANEOUS | Status: DC
  Administered 2018-09-19: 1 via TOPICAL
  Filled 2018-09-18: qty 15

## 2018-09-18 MED ORDER — SODIUM CHLORIDE 0.9 % IV SOLN: 100.0000 mL | INTRAVENOUS | Status: DC | PRN

## 2018-09-18 MED ORDER — PANTOPRAZOLE SODIUM 40 MG PO TBEC
40.0000 mg | DELAYED_RELEASE_TABLET | Freq: Every day | ORAL | Status: DC
  Administered 2018-09-18 – 2018-09-19 (×2): 40 mg via ORAL
  Filled 2018-09-18 (×2): qty 1

## 2018-09-18 MED ORDER — ORAL CARE MOUTH RINSE
15.0000 mL | Freq: Two times a day (BID) | OROMUCOSAL | Status: DC
  Administered 2018-09-18 (×2): 15 mL via OROMUCOSAL

## 2018-09-18 MED ORDER — ALTEPLASE 2 MG IJ SOLR: 2.0000 mg | Freq: Once | INTRAMUSCULAR | Status: DC | PRN

## 2018-09-18 MED ORDER — HYDRALAZINE HCL 20 MG/ML IJ SOLN: 5.0000 mg | INTRAMUSCULAR | Status: DC | PRN

## 2018-09-18 MED ORDER — ACETAMINOPHEN 650 MG RE SUPP: 650.0000 mg | Freq: Four times a day (QID) | RECTAL | Status: DC | PRN

## 2018-09-18 MED ORDER — ACETAMINOPHEN 325 MG PO TABS: 650.0000 mg | ORAL_TABLET | Freq: Four times a day (QID) | ORAL | Status: DC | PRN

## 2018-09-18 MED ORDER — PRASUGREL HCL 10 MG PO TABS
10.0000 mg | ORAL_TABLET | Freq: Every day | ORAL | Status: DC
  Administered 2018-09-18 – 2018-09-19 (×2): 10 mg via ORAL
  Filled 2018-09-18 (×2): qty 1

## 2018-09-18 MED ORDER — LIDOCAINE HCL (PF) 1 % IJ SOLN
5.0000 mL | INTRAMUSCULAR | Status: DC | PRN
  Filled 2018-09-18: qty 5

## 2018-09-18 MED ORDER — NICOTINE 14 MG/24HR TD PT24: 14.0000 mg | MEDICATED_PATCH | Freq: Every day | TRANSDERMAL | Status: DC | PRN

## 2018-09-18 MED ORDER — HEPARIN 1000 UNIT/ML FOR PERITONEAL DIALYSIS
INTRAPERITONEAL | Status: DC | PRN
  Filled 2018-09-18: qty 6000

## 2018-09-18 MED ORDER — AMLODIPINE BESYLATE 10 MG PO TABS
10.0000 mg | ORAL_TABLET | Freq: Every day | ORAL | Status: DC
  Administered 2018-09-18 – 2018-09-19 (×2): 10 mg via ORAL
  Filled 2018-09-18 (×2): qty 1

## 2018-09-18 MED ORDER — HEPARIN 1000 UNIT/ML FOR PERITONEAL DIALYSIS
500.0000 [IU] | INTRAMUSCULAR | Status: DC | PRN
  Filled 2018-09-18: qty 0.5

## 2018-09-18 MED ORDER — CHLORHEXIDINE GLUCONATE CLOTH 2 % EX PADS: 6.0000 | MEDICATED_PAD | Freq: Every day | CUTANEOUS | Status: DC

## 2018-09-18 MED ORDER — INSULIN GLARGINE 100 UNIT/ML ~~LOC~~ SOLN
20.0000 [IU] | Freq: Every day | SUBCUTANEOUS | Status: DC
  Filled 2018-09-18: qty 0.2

## 2018-09-18 MED ORDER — INSULIN ASPART 100 UNIT/ML ~~LOC~~ SOLN
0.0000 [IU] | Freq: Three times a day (TID) | SUBCUTANEOUS | Status: DC
  Administered 2018-09-18: 8 [IU] via SUBCUTANEOUS
  Administered 2018-09-18 (×2): 3 [IU] via SUBCUTANEOUS
  Administered 2018-09-19: 2 [IU] via SUBCUTANEOUS
  Filled 2018-09-18 (×4): qty 1

## 2018-09-18 MED ORDER — ONDANSETRON HCL 4 MG PO TABS: 4.0000 mg | ORAL_TABLET | Freq: Four times a day (QID) | ORAL | Status: DC | PRN

## 2018-09-18 MED ORDER — PENTAFLUOROPROP-TETRAFLUOROETH EX AERO
1.0000 "application " | INHALATION_SPRAY | CUTANEOUS | Status: DC | PRN
  Filled 2018-09-18: qty 30

## 2018-09-18 MED ORDER — HEPARIN SODIUM (PORCINE) 5000 UNIT/ML IJ SOLN
5000.0000 [IU] | Freq: Three times a day (TID) | INTRAMUSCULAR | Status: DC
  Administered 2018-09-18: 5000 [IU] via SUBCUTANEOUS
  Filled 2018-09-18: qty 1

## 2018-09-18 MED ORDER — LIDOCAINE-PRILOCAINE 2.5-2.5 % EX CREA
1.0000 "application " | TOPICAL_CREAM | CUTANEOUS | Status: DC | PRN
  Filled 2018-09-18: qty 5

## 2018-09-18 MED ORDER — CHLORHEXIDINE GLUCONATE 0.12 % MT SOLN
15.0000 mL | Freq: Two times a day (BID) | OROMUCOSAL | Status: DC
  Administered 2018-09-18 (×2): 15 mL via OROMUCOSAL
  Filled 2018-09-18 (×3): qty 15

## 2018-09-18 MED ORDER — CALCIUM ACETATE (PHOS BINDER) 667 MG PO CAPS
2001.0000 mg | ORAL_CAPSULE | Freq: Three times a day (TID) | ORAL | Status: DC
  Administered 2018-09-18 – 2018-09-19 (×3): 2001 mg via ORAL
  Filled 2018-09-18 (×7): qty 3

## 2018-09-18 MED ORDER — POLYETHYLENE GLYCOL 3350 17 G PO PACK: 17.0000 g | PACK | Freq: Every day | ORAL | Status: DC | PRN

## 2018-09-18 MED ORDER — CALCITRIOL 0.25 MCG PO CAPS
0.2500 ug | ORAL_CAPSULE | Freq: Every day | ORAL | Status: DC
  Administered 2018-09-18 – 2018-09-19 (×2): 0.25 ug via ORAL
  Filled 2018-09-18 (×2): qty 1

## 2018-09-18 MED ORDER — INSULIN ASPART 100 UNIT/ML ~~LOC~~ SOLN: 0.0000 [IU] | Freq: Every day | SUBCUTANEOUS | Status: DC

## 2018-09-18 MED ORDER — HYDRALAZINE HCL 50 MG PO TABS
25.0000 mg | ORAL_TABLET | Freq: Three times a day (TID) | ORAL | Status: DC
  Administered 2018-09-18 – 2018-09-19 (×3): 25 mg via ORAL
  Filled 2018-09-18 (×3): qty 1

## 2018-09-18 MED ORDER — HEPARIN SODIUM (PORCINE) 1000 UNIT/ML DIALYSIS: 1000.0000 [IU] | INTRAMUSCULAR | Status: DC | PRN

## 2018-09-18 MED ORDER — RENA-VITE PO TABS
1.0000 | ORAL_TABLET | Freq: Every day | ORAL | Status: DC
  Administered 2018-09-18 – 2018-09-19 (×2): 1 via ORAL
  Filled 2018-09-18 (×2): qty 1

## 2018-09-18 NOTE — Progress Notes (Signed)
Family Meeting Note  Advance Directive:yes  Today a meeting took place with the Patient.  Patient is able to participate.  The following clinical team members were present during this meeting:MD  The following were discussed:Patient's diagnosis: acute hypoxic respiratory failure, Patient's progosis: Unable to determine and Goals for treatment: Full Code   Patient states that he has a lot of medical conditions, but that he has been doing pretty well recently.  Peritoneal dialysis is going well.  He has thought about code status in the past, especially given his heart problems.  He would like to be full code at this time.  Additional follow-up to be provided: prn  Time spent during discussion:20 minutes  Evette Doffing, MD

## 2018-09-18 NOTE — Progress Notes (Signed)
[place] [facility] [place] NAME: Scott [name]    MR#:  [id number]  DATE OF BIRTH:  [birth year]  SUBJECTIVE:  CHIEF COMPLAINT:   Chief Complaint  Patient presents with  . Shortness of Breath  Patient seen and evaluated today Has been weaned off BiPAP On oxygen via nasal cannula Has orthopnea  REVIEW OF SYSTEMS:    ROS  CONSTITUTIONAL: No documented fever. Has fatigue, weakness. No weight gain, no weight loss.  EYES: No blurry or double vision.  ENT: No tinnitus. No postnasal drip. No redness of the oropharynx.  RESPIRATORY: occasional cough, no wheeze, no hemoptysis. Has dyspnea.  CARDIOVASCULAR: No chest pain. No orthopnea. No palpitations. No syncope.  GASTROINTESTINAL: No nausea, no vomiting or diarrhea. No abdominal pain. No melena or hematochezia.  GENITOURINARY: No dysuria or hematuria.  ENDOCRINE: No polyuria or nocturia. No heat or cold intolerance.  HEMATOLOGY: No anemia. No bruising. No bleeding.  INTEGUMENTARY: No rashes. No lesions.  MUSCULOSKELETAL: No arthritis. No swelling. No gout.  NEUROLOGIC: No numbness, tingling, or ataxia. No seizure-type activity.  PSYCHIATRIC: No anxiety. No insomnia. No ADD.   DRUG ALLERGIES:  No Known Allergies  VITALS:  Blood pressure 139/84, pulse 78, temperature 98.5 F (36.9 C), temperature source Axillary, resp. rate (!) 21, height 5\' 9"  (1.753 m), weight 108.8 kg, SpO2 99 %.  PHYSICAL EXAMINATION:   Physical Exam  GENERAL:  58 y.o.-year-old patient lying in the bed with no acute distress.  EYES: Pupils equal, round, reactive to light and accommodation. No scleral icterus. Extraocular muscles intact.  HEENT: Head atraumatic, normocephalic. Oropharynx and nasopharynx clear.  NECK:  Supple, no jugular venous distention. No thyroid enlargement, no tenderness.  LUNGS: Decreased breath sounds bilaterally, bilateral rales heard. No use of accessory muscles of respiration.  CARDIOVASCULAR: S1,  S2 normal. No murmurs, rubs, or gallops.  ABDOMEN: Soft, nontender, nondistended. Bowel sounds present. No organomegaly or mass.  EXTREMITIES: No cyanosis, clubbing  has edema b/l.    NEUROLOGIC: Cranial nerves II through XII are intact. No focal Motor or sensory deficits b/l.   PSYCHIATRIC: The patient is alert and oriented x 3.  SKIN: No obvious rash, lesion, or ulcer.   LABORATORY PANEL:   CBC Recent Labs  Lab 09/18/18 0450  WBC 9.5  HGB 10.2*  HCT 32.9*  PLT 226   ------------------------------------------------------------------------------------------------------------------ Chemistries  Recent Labs  Lab 09/17/18 2156 09/18/18 0450  NA 141 138  K 4.4 4.9  CL 111 110  CO2 19* 19*  GLUCOSE 136* 186*  BUN 56* 63*  CREATININE 6.89* 7.04*  CALCIUM 6.8* 6.6*  MG  --  2.0  AST 17  --   ALT 15  --   ALKPHOS 101  --   BILITOT 0.8  --    ------------------------------------------------------------------------------------------------------------------  Cardiac Enzymes Recent Labs  Lab 09/17/18 2156  TROPONINI <0.03   ------------------------------------------------------------------------------------------------------------------  RADIOLOGY:  Dg Chest Port 1 View  Result Date: 09/17/2018 CLINICAL DATA:  Initial evaluation for acute shortness of breath. EXAM: PORTABLE CHEST 1 VIEW COMPARISON:  Prior radiograph from 05/03/2018. FINDINGS: Moderate cardiomegaly, stable.  Mediastinal silhouette normal. Lungs normally inflated. Diffuse pulmonary vascular and interstitial congestion, compatible with moderate diffuse pulmonary interstitial edema. Suspected small bilateral pleural effusions. Findings most consistent with CHF. No superimposed consolidative airspace disease. No pneumothorax. Osseous structures demonstrate no acute abnormality. IMPRESSION: Cardiomegaly with moderate diffuse pulmonary interstitial edema and small bilateral pleural effusions, compatible with acute  CHF exacerbation. Electronically Signed  By: Jeannine Boga M.D.   On: 09/17/2018 22:40     ASSESSMENT AND PLAN:  58 year old male patient with history of end-stage renal disease on peritoneal dialysis, chronic diastolic heart failure, hypertension, bronchial asthma, coronary disease, hyperlipidemia, type 2 diabetes mellitus currently in the stepdown unit for shortness of breath  -Acute hypoxic respiratory distress Weaned of BiPAP On oxygen via nasal cannula Diuresis with IV Lasix Check echocardiogram Nephrology evaluation Intensivist evaluation  -End-stage renal disease on peritoneal dialysis Nephrology has evaluated the patient Dialysis today  -Hypertension Continue Norvasc, hydralazine and Coreg  -Coronary disease with history of stent Continue aspirin, beta-blocker and Effient  -Type 2 diabetes mellitus Diabetic diet with sliding scale coverage with insulin and Lantus insulin  -Tobacco abuse Tobacco cessation counseled to the patient for six minutes Nicotine patch offered  -Transfer to medical floor once stable   All the records are reviewed and case discussed with Care Management/Social Worker. Management plans discussed with the patient, family and they are in agreement.  CODE STATUS: Full code  DVT Prophylaxis: SCDs  TOTAL TIME TAKING CARE OF THIS PATIENT: 36 minutes.   POSSIBLE D/C IN 2 to 3 DAYS, DEPENDING ON CLINICAL CONDITION.  Saundra Shelling M.D on 09/18/2018 at 11:16 AM  Between 7am to 6pm - Pager - [phone]  After 6pm go to [email or web address] - password [email or web address] [place] [facility]  Office  [phone]  CC: Primary care physician; [employer]  Note: This dictation was prepared with Diplomatic Services operational officer dictation along with smaller Company secretary. Any transcriptional errors that result from this process are unintentional.

## 2018-09-18 NOTE — Consult Note (Addendum)
PULMONARY / CRITICAL CARE MEDICINE  Name: Scott [name] MRN: [id number] DOB: 11/10/1960    LOS: 1  Referring Provider:  Dr. Brett Albino Reason for Referral:  Acute respiratory failure Brief patient description:  58 y/o male with ESRD on peritoneal dialysis admitted with acute respiratory failure secondary to acute CHF exacerbation on BiPAP  HPI: This is a 58 year old male with a medical history as indicated below, end-stage renal disease on peritoneal dialysis who presented to the ED with worsening shortness of breath that started the day before.  He reported taking out extra 1 L of fluid during his peritoneal dialysis but that did not seem to help.  Shortness of breath is associated with lower extremity edema, increased work of breathing and severe exertional dyspnea.  At the ED, his chest x-ray showed pulmonary edema.  He was placed on BiPAP and admitted to the ICU. He also has an AV fistula.  Last 2D echo was March 09, 2018 and showed an EF of 50 to 55%  Past Medical History:  Diagnosis Date  . Arthritis    "left arm; right leg" (12/13/2014)  . Asthma   . CHF (congestive heart failure) ([place])   . Chronic disease anemia    [name] 12/13/2014  . Chronic kidney disease (CKD), stage IV (severe) ([place])    [name] 12/13/2014... on dialysis  . Complication of anesthesia    unable to urinate after CAPD urgery  . Continuous ambulatory peritoneal dialysis status ([place])   . Coronary artery disease    [name] 12/13/2014  . Depression   . Dysrhythmia    patient unaware of irregular heartbeat  . GERD (gastroesophageal reflux disease)   . High cholesterol    [name] 12/13/2014  . Hypertension   . Non-Q wave myocardial infarction ([place])    [name] 12/13/2014  . PVD (peripheral vascular disease) ([place])    [name] 12/13/2014  . Type II diabetes mellitus ([place])    Past Surgical History:  Procedure Laterality Date  . AV FISTULA PLACEMENT Right 04/07/2017   Procedure: ARTERIOVENOUS (AV) FISTULA CREATION (  BRACHIALCEPHALIC );  Surgeon: Katha Cabal, MD;  Location: [facility];  Service: Vascular;  Laterality: Right;  . CAPD INSERTION N/A 04/07/2017   Procedure: LAPAROSCOPIC INSERTION CONTINUOUS AMBULATORY PERITONEAL DIALYSIS  (CAPD) CATHETER;  Surgeon: Katha Cabal, MD;  Location: [facility];  Service: Vascular;  Laterality: N/A;  . CARDIAC CATHETERIZATION  11/2014  . COLONOSCOPY WITH PROPOFOL N/A 12/01/2017   Procedure: COLONOSCOPY WITH PROPOFOL;  Surgeon: Lin Landsman, MD;  Location: [place];  Service: Endoscopy;  Laterality: N/A;  Diabetic - insulin  . COLONOSCOPY WITH PROPOFOL N/A 12/29/2017   Procedure: COLONOSCOPY WITH PROPOFOL;  Surgeon: Lin Landsman, MD;  Location: [place];  Service: Endoscopy;  Laterality: N/A;  . DIALYSIS/PERMA CATHETER INSERTION N/A 01/18/2017   Procedure: Dialysis/Perma Catheter Insertion;  Surgeon: Algernon Huxley, MD;  Location: [place] [facility];  Service: Cardiovascular;  Laterality: N/A;  . DIALYSIS/PERMA CATHETER REMOVAL N/A 07/26/2017   Procedure: DIALYSIS/PERMA CATHETER REMOVAL;  Surgeon: Algernon Huxley, MD;  Location: [place] [facility];  Service: Cardiovascular;  Laterality: N/A;  . INCISION AND DRAINAGE OF WOUND Right ~ 10/2014   "4th toe foot"  . PERCUTANEOUS CORONARY STENT INTERVENTION (PCI-S) N/A 12/14/2014   Procedure: PERCUTANEOUS CORONARY STENT INTERVENTION (PCI-S);  Surgeon: Charolette Forward, MD;  Location: [facility];  Service: Cardiovascular;  Laterality: N/A;  . PERCUTANEOUS CORONARY STENT INTERVENTION (PCI-S) N/A 12/18/2014   Procedure: PERCUTANEOUS  CORONARY STENT INTERVENTION (PCI-S);  Surgeon: Charolette Forward, MD;  Location: [facility] C[facility];  Service: Cardiovascular;  Laterality: N/A;  . POLYPECTOMY  12/01/2017   Procedure: POLYPECTOMY;  Surgeon: Lin Landsman, MD;  Location: [place];  Service: Endoscopy;;  . POLYPECTOMY  12/29/2017   Procedure: POLYPECTOMY;  Surgeon: Lin Landsman, MD;  Location:  [place];  Service: Endoscopy;;  . TOE AMPUTATION Right 2015   4th toe   Prior to Admission medications   Medication Sig Start Date End Date Taking? Authorizing Provider  amLODipine (NORVASC) 5 MG tablet Take 5 mg by mouth daily.   Yes [provider]  clopidogrel (PLAVIX) 75 MG tablet Take 75 mg by mouth daily.   Yes [provider]  donepezil (ARICEPT) 5 MG tablet Take 1 tablet (5 mg total) by mouth at bedtime. 04/25/18 06/04/18 Yes Sowles, Drue Stager, MD  empagliflozin (JARDIANCE) 25 MG TABS tablet Take 25 mg by mouth daily.   Yes [provider]  glycopyrrolate (ROBINUL) 1 MG tablet Take 1 mg by mouth 2 (two) times daily.   Yes [provider]  insulin aspart (NOVOLOG FLEXPEN) 100 UNIT/ML FlexPen Inject 12 Units into the skin 2 (two) times daily.   Yes [provider]  insulin aspart (NOVOLOG) 100 UNIT/ML FlexPen Inject 18 Units into the skin daily. At 1700   Yes [provider]  Insulin Degludec-Liraglutide (XULTOPHY) 100-3.6 UNIT-MG/ML SOPN Inject 50 Units into the skin daily.   Yes [provider]  levETIRAcetam (KEPPRA) 500 MG tablet Take 500 mg by mouth 2 (two) times daily.   Yes [provider]  lipase/protease/amylase (CREON) 12000 units CPEP capsule Take 6,000 Units by mouth 3 (three) times daily before meals.   Yes [provider]  lipase/protease/amylase (CREON) 12000 units CPEP capsule Take 3,000 Units by mouth at bedtime. With snack   Yes [provider]  lisinopril (PRINIVIL,ZESTRIL) 5 MG tablet Take 5 mg by mouth daily.   Yes [provider]  metoprolol succinate (TOPROL-XL) 25 MG 24 hr tablet Take 1 tablet (25 mg total) by mouth daily. 04/25/18  Yes Sowles, Drue Stager, MD  rosuvastatin (CRESTOR) 40 MG tablet Take 1 tablet (40 mg total) by mouth daily. 04/25/18 06/04/18 Yes Steele Sizer, MD  aspirin EC 81 MG tablet Take 81 mg by mouth daily.    [provider]   famotidine (PEPCID) 20 MG tablet Take 1 tablet (20 mg total) by mouth 2 (two) times daily. 04/25/18 05/25/18  Steele Sizer, MD  gabapentin (NEURONTIN) 300 MG capsule Take 1 capsule (300 mg total) by mouth 2 (two) times daily. 04/25/18 05/25/18  Steele Sizer, MD  insulin glargine (LANTUS) 100 UNIT/ML injection Inject 0.1 mLs (10 Units total) into the skin daily. 04/25/18 05/25/18  Steele Sizer, MD  lacosamide 100 MG TABS Take 1 tablet (100 mg total) by mouth 2 (two) times daily. Patient not taking: Reported on 06/04/2018 09/10/17   Fritzi Mandes, MD  promethazine (PHENERGAN) 12.5 MG tablet Take 1 tablet (12.5 mg total) by mouth every 6 (six) hours as needed for nausea or vomiting. Patient not taking: Reported on 06/04/2018 06/29/17   Stark Klein, MD  sertraline (ZOLOFT) 25 MG tablet Take 1 tablet (25 mg total) by mouth daily. Patient not taking: Reported on 06/04/2018 04/25/18   Steele Sizer, MD   Allergies No Known Allergies  Family History Family History  Problem Relation Age of Onset  . Cancer Mother        Lung  .  Cancer Father        Lung  . Diabetes Sister   . Diabetes Brother   . CAD Brother   . Hernia Son   . Cancer Brother    Social History  reports that he quit smoking about 3 years ago. His smoking use included cigarettes. He started smoking about 34 years ago. He has a 30.00 pack-year smoking history. He has never used smokeless tobacco. He reports current alcohol use. He reports that he does not use drugs.  Review Of Systems: Unable to obtain as patient is on continuous BiPAP  VITAL SIGNS: BP (!) 146/88   Pulse 73   Temp 97.7 F (36.5 C) (Oral)   Resp 19   Ht 5\' 9"  (1.753 m)   Wt 108.9 kg   SpO2 97%   BMI 35.44 kg/m   HEMODYNAMICS:    VENTILATOR SETTINGS:    INTAKE / OUTPUT: No intake/output data recorded.  PHYSICAL EXAMINATION: General: Sleeping comfortably, in no acute distress HEENT: PERRLA, trachea midline, no JVD Neuro: Alert and oriented x3,  no focal deficit Cardiovascular: Apical pulse regular, S1-S2, no murmur regurg or gallop, +2 pulses bilaterally, +2 edema Lungs: Increased work of breathing, bilateral breath sounds, diminished in bases, bibasilar crackles Abdomen: Nondistended, normal bowel sounds in all 4 quadrants, palpation reveals no organomegaly Musculoskeletal: No joint deformities Skin: Warm and dry  LABS:  BMET Recent Labs  Lab 09/17/18 2156  NA 141  K 4.4  CL 111  CO2 19*  BUN 56*  CREATININE 6.89*  GLUCOSE 136*    Electrolytes Recent Labs  Lab 09/17/18 2156  CALCIUM 6.8*    CBC Recent Labs  Lab 09/17/18 2156  WBC 11.6*  HGB 10.8*  HCT 33.6*  PLT 240    Coag's No results for input(s): APTT, INR in the last 168 hours.  Sepsis Markers Recent Labs  Lab 09/17/18 2212  LATICACIDVEN 0.70    ABG No results for input(s): PHART, PCO2ART, PO2ART in the last 168 hours.  Liver Enzymes Recent Labs  Lab 09/17/18 2156  AST 17  ALT 15  ALKPHOS 101  BILITOT 0.8  ALBUMIN 3.4*    Cardiac Enzymes Recent Labs  Lab 09/17/18 2156  TROPONINI <0.03    Glucose No results for input(s): GLUCAP in the last 168 hours.  Imaging Dg Chest Port 1 View  Result Date: 09/17/2018 CLINICAL DATA:  Initial evaluation for acute shortness of breath. EXAM: PORTABLE CHEST 1 VIEW COMPARISON:  Prior radiograph from 05/03/2018. FINDINGS: Moderate cardiomegaly, stable.  Mediastinal silhouette normal. Lungs normally inflated. Diffuse pulmonary vascular and interstitial congestion, compatible with moderate diffuse pulmonary interstitial edema. Suspected small bilateral pleural effusions. Findings most consistent with CHF. No superimposed consolidative airspace disease. No pneumothorax. Osseous structures demonstrate no acute abnormality. IMPRESSION: Cardiomegaly with moderate diffuse pulmonary interstitial edema and small bilateral pleural effusions, compatible with acute CHF exacerbation. Electronically Signed    By: Jeannine Boga M.D.   On: 09/17/2018 22:40     STUDIES:  2D echo pending  CULTURES: Blood cultures x2  ANTIBIOTICS: None  SIGNIFICANT EVENTS: 09/17/2018: Admitted  LINES/TUBES: Peripheral IVs   ASSESSMENT Acute hypoxic respiratory failure Acute CHF exacerbation Acute pulmonary edema End-stage renal disease on peritoneal dialysis Bilateral pleural effusions Type 2 diabetes mellitus Tobacco use disorder   PLAN Continues BiPAP and titrate to nasal cannula as tolerated IV diuresis 2D echo Continue home blood pressure medications Blood glucose monitoring with sliding scale insulin coverage PRN albuterol  Best Practice: Code Status: Full  code Diet: N.p.o. except for meds with sips while on BiPAP GI prophylaxis: Protonix 40 mg daily VTE prophylaxis: Subcu heparin  FAMILY  - Updates: No family at bedside.  Will update when available  [facility] ANP-BC Pulmonary and Critical Care Medicine [facility] Pager [phone] or [phone]  NB: This document was prepared using Dragon voice recognition software and may include unintentional dictation errors.    09/18/2018, 12:56 AM   I have seen and examined the pt.above note reviewed. Labs cxr film/report reviewed. He is admitted with pulm edema/volume overload with h/o ESRD on PD. He is off the bipap this am and is doing well. D/w renal. Our plan is to perform HD as he has mature AV fistula and remove fluid. There is no evidence of infection. He is full code

## 2018-09-18 NOTE — Progress Notes (Signed)
This note also relates to the following rows which could not be included: Resp - Cannot attach notes to unvalidated device data  Attempted to cannulate right AVF without success.  Dr Holley Raring notified that AVF is still not mature  enough for hemodialysis.

## 2018-09-18 NOTE — Progress Notes (Signed)
Pd started 

## 2018-09-18 NOTE — Progress Notes (Signed)
[facility]  ROUNDING NOTE   Subjective:  Patient well-known to [place] as we follow him for outpatient peritoneal dialysis. He is on a CAPD regimen of 4 exchanges per day. Recently has had some dietary indiscretion. He went to the [employer] and states that he had a significant amount of sodium intake. Came in with pulmonary edema. He still has a functional right upper extremity AV fistula in place.   Objective:  Vital signs in last 24 hours:  Temp:  [97.7 F (36.5 C)-98.5 F (36.9 C)] 98.5 F (36.9 C) (01/19 0500) Pulse Rate:  [72-81] 78 (01/19 0725) Resp:  [15-28] 21 (01/19 0725) BP: (137-219)/(81-107) 139/84 (01/19 0600) SpO2:  [81 %-100 %] 99 % (01/19 0838) FiO2 (%):  [30 %] 30 % (01/19 0725) Weight:  [108.8 kg-108.9 kg] 108.8 kg (01/19 0106)  Weight change:  Filed Weights   09/17/18 2154 09/18/18 0106  Weight: 108.9 kg 108.8 kg    Intake/Output: No intake/output data recorded.   Intake/Output this shift:  Total I/O In: -  Out: 400 [Urine:400]  Physical Exam: General: No acute distress  Head: Normocephalic, atraumatic. Moist oral mucosal membranes  Eyes: Anicteric  Neck: Supple, trachea midline  Lungs:  Basilar rales, normal effort  Heart: S1S2 no rubs  Abdomen:  Soft, nontender, bowel sounds present  Extremities: 2+ peripheral edema.  Neurologic: Awake, alert, following commands  Skin: No lesions  Access: PD catheter, RUE AVF    Basic Metabolic Panel: Recent Labs  Lab 09/17/18 2156 09/18/18 0450  NA 141 138  K 4.4 4.9  CL 111 110  CO2 19* 19*  GLUCOSE 136* 186*  BUN 56* 63*  CREATININE 6.89* 7.04*  CALCIUM 6.8* 6.6*  MG  --  2.0  PHOS  --  6.7*    Liver Function Tests: Recent Labs  Lab 09/17/18 2156  AST 17  ALT 15  ALKPHOS 101  BILITOT 0.8  PROT 6.9  ALBUMIN 3.4*   No results for input(s): LIPASE, AMYLASE in the last 168 hours. No results for input(s): AMMONIA in the last 168 hours.  CBC: Recent Labs   Lab 09/17/18 2156 09/18/18 0450  WBC 11.6* 9.5  NEUTROABS 9.4*  --   HGB 10.8* 10.2*  HCT 33.6* 32.9*  MCV 95.2 97.3  PLT 240 226    Cardiac Enzymes: Recent Labs  Lab 09/17/18 2156  TROPONINI <0.03    BNP: Invalid input(s): POCBNP  CBG: Recent Labs  Lab 09/18/18 0054 09/18/18 0804  GLUCAP 139* 167*    Microbiology: Results for orders placed or performed during the hospital encounter of 09/17/18  Culture, blood (routine x 2)     Status: None (Preliminary result)   Collection Time: 09/17/18  9:55 PM  Result Value Ref Range Status   Specimen Description BLOOD LEFT FATTY CASTS  Final   Special Requests   Final    BOTTLES DRAWN AEROBIC AND ANAEROBIC Blood Culture results may not be optimal due to an excessive volume of blood received in culture bottles   Culture   Final    NO GROWTH < 12 HOURS Performed at [facility], [address] [place], [place] [address]    Report Status PENDING  Incomplete  Culture, blood (routine x 2)     Status: None (Preliminary result)   Collection Time: 09/17/18 10:02 PM  Result Value Ref Range Status   Specimen Description BLOOD LEFT ANTECUBITAL  Final   Special Requests NONE  Final   Culture  Final    NO GROWTH < 12 HOURS Performed at [facility], [address] [place], [place] [address]    Report Status PENDING  Incomplete  MRSA PCR Screening     Status: None   Collection Time: 09/18/18  5:10 AM  Result Value Ref Range Status   MRSA by PCR NEGATIVE NEGATIVE Final    Comment:        The GeneXpert MRSA Assay (FDA approved for NASAL specimens only), is one component of a comprehensive MRSA colonization surveillance program. It is not intended to diagnose MRSA infection nor to guide or monitor treatment for MRSA infections. Performed at [facility], [address] [place], [place] [address]     Coagulation Studies: No results for input(s): LABPROT, INR in the last 72  hours.  Urinalysis: No results for input(s): COLORURINE, LABSPEC, PHURINE, GLUCOSEU, HGBUR, BILIRUBINUR, KETONESUR, PROTEINUR, UROBILINOGEN, NITRITE, LEUKOCYTESUR in the last 72 hours.  Invalid input(s): APPERANCEUR    Imaging: Dg Chest Port 1 View  Result Date: 09/17/2018 CLINICAL DATA:  Initial evaluation for acute shortness of breath. EXAM: PORTABLE CHEST 1 VIEW COMPARISON:  Prior radiograph from 05/03/2018. FINDINGS: Moderate cardiomegaly, stable.  Mediastinal silhouette normal. Lungs normally inflated. Diffuse pulmonary vascular and interstitial congestion, compatible with moderate diffuse pulmonary interstitial edema. Suspected small bilateral pleural effusions. Findings most consistent with CHF. No superimposed consolidative airspace disease. No pneumothorax. Osseous structures demonstrate no acute abnormality. IMPRESSION: Cardiomegaly with moderate diffuse pulmonary interstitial edema and small bilateral pleural effusions, compatible with acute CHF exacerbation. Electronically Signed   By: Jeannine Boga M.D.   On: 09/17/2018 22:40     Medications:    . amLODipine  10 mg Oral Daily  . aspirin EC  81 mg Oral Daily  . calcitRIOL  0.25 mcg Oral Daily  . calcium acetate  2,001 mg Oral TID WC  . carvedilol  25 mg Oral BID WC  . chlorhexidine  15 mL Mouth Rinse BID  . cholecalciferol  2,000 Units Oral Daily  . furosemide  80 mg Intravenous TID  . heparin  5,000 Units Subcutaneous Q8H  . hydrALAZINE  25 mg Oral TID  . insulin aspart  0-15 Units Subcutaneous TID WC  . insulin aspart  0-5 Units Subcutaneous QHS  . [START ON 09/19/2018] insulin glargine  20 Units Subcutaneous QHS  . mouth rinse  15 mL Mouth Rinse q12n4p  . multivitamin  1 tablet Oral Daily  . pantoprazole  40 mg Oral Daily  . prasugrel  10 mg Oral Daily   acetaminophen **OR** acetaminophen, albuterol, hydrALAZINE, HYDROcodone-acetaminophen, nicotine, ondansetron **OR** ondansetron (ZOFRAN) IV, polyethylene  glycol  Assessment/ Plan:  58 y.o. male  with end stage renal disease on peritoneal dialysis, congestive heart failure, GERD, hypertension, peripheral vascular disease, diabetes mellitus type 2, diabetic retinopathy, diabetic gastroparesis   [facility]/CAPD/[address]   1.  ESRD on peritoneal dialysis. 2.  Acute pulmonary edema. 3.  Anemia of chronic kidney disease. 4.  Secondary hyperparathyroidism. 5.  Hypertension.  Plan: Patient came in with significant shortness of breath and volume overload.  He recently went to the [employer] football game.  He had significant dietary indiscretion at that time.   We will plan to perform hemodialysis to aid with ultrafiltration today.  Thereafter he can be restarted on peritoneal dialysis tomorrow.  Okay to continue Lasix 80 mg IV 3 times daily for now as well.  Continue to monitor respiratory status closely.  Further plan as patient progresses.  LOS: 1 Ferne [place] 1/19/202010:45 AM

## 2018-09-19 LAB — GLUCOSE, CAPILLARY
Glucose-Capillary: 144 mg/dL — ABNORMAL HIGH (ref 70–99)
Glucose-Capillary: 147 mg/dL — ABNORMAL HIGH (ref 70–99)

## 2018-09-19 LAB — ECHOCARDIOGRAM COMPLETE
Height: 69 in
Weight: 3837.77 oz

## 2018-09-19 MED ORDER — FUROSEMIDE 40 MG PO TABS: 80.0000 mg | ORAL_TABLET | Freq: Every day | ORAL | 0 refills | Status: DC

## 2018-09-19 MED ORDER — CALCIUM CARBONATE ANTACID 500 MG PO CHEW
2.0000 | CHEWABLE_TABLET | Freq: Two times a day (BID) | ORAL | Status: DC
  Administered 2018-09-19: 400 mg via ORAL
  Filled 2018-09-19: qty 2

## 2018-09-19 NOTE — Progress Notes (Signed)
[facility]  ROUNDING NOTE   Subjective:   CCPD overnight. 4% dextrose  UOP 1227 - furosemide 60mg  IV q8.   Calcium 6.6  Attempted to use AVF yesterday for emergent hemodialysis. However AVF immature.   Objective:  Vital signs in last 24 hours:  Temp:  [97.6 F (36.4 C)-97.7 F (36.5 C)] 97.6 F (36.4 C) (01/20 0810) Pulse Rate:  [72-83] 72 (01/20 0812) Resp:  [15-25] 20 (01/20 0812) BP: (121-161)/(73-89) 125/77 (01/20 0812) SpO2:  [94 %-97 %] 94 % (01/20 0812) Weight:  [101.8 kg] 101.8 kg (01/20 0500)  Weight change: -7.063 kg Filed Weights   09/17/18 2154 09/18/18 0106 09/19/18 0500  Weight: 108.9 kg 108.8 kg 101.8 kg    Intake/Output: I/O last 3 completed shifts: In: 1080 [P.O.:1080] Out: 1227 [Urine:1225; Stool:2]   Intake/Output this shift:  No intake/output data recorded.  Physical Exam: General: No acute distress  Head: Normocephalic, atraumatic. Moist oral mucosal membranes  Eyes: Anicteric  Neck: Supple, trachea midline  Lungs:  Left basilar crackles  Heart: S1S2 no rubs  Abdomen:  Soft, nontender, bowel sounds present  Extremities: 1+ peripheral edema.  Neurologic: Awake, alert, following commands  Skin: No lesions  Access: PD catheter, RUE AVF +thrill +bruit    Basic Metabolic Panel: Recent Labs  Lab 09/17/18 2156 09/18/18 0450 09/18/18 1439  NA 141 138  --   K 4.4 4.9  --   CL 111 110  --   CO2 19* 19*  --   GLUCOSE 136* 186*  --   BUN 56* 63*  --   CREATININE 6.89* 7.04*  --   CALCIUM 6.8* 6.6*  --   MG  --  2.0  --   PHOS  --  6.7* 6.9*    Liver Function Tests: Recent Labs  Lab 09/17/18 2156  AST 17  ALT 15  ALKPHOS 101  BILITOT 0.8  PROT 6.9  ALBUMIN 3.4*   No results for input(s): LIPASE, AMYLASE in the last 168 hours. No results for input(s): AMMONIA in the last 168 hours.  CBC: Recent Labs  Lab 09/17/18 2156 09/18/18 0450  WBC 11.6* 9.5  NEUTROABS 9.4*  --   HGB 10.8* 10.2*  HCT 33.6* 32.9*   MCV 95.2 97.3  PLT 240 226    Cardiac Enzymes: Recent Labs  Lab 09/17/18 2156  TROPONINI <0.03    BNP: Invalid input(s): POCBNP  CBG: Recent Labs  Lab 09/18/18 0804 09/18/18 1136 09/18/18 1613 09/18/18 2136 09/19/18 0725  GLUCAP 167* 251* 192* 139* 144*    Microbiology: Results for orders placed or performed during the hospital encounter of 09/17/18  Culture, blood (routine x 2)     Status: None (Preliminary result)   Collection Time: 09/17/18  9:55 PM  Result Value Ref Range Status   Specimen Description BLOOD LEFT FATTY CASTS  Final   Special Requests   Final    BOTTLES DRAWN AEROBIC AND ANAEROBIC Blood Culture results may not be optimal due to an excessive volume of blood received in culture bottles   Culture   Final    NO GROWTH 2 DAYS Performed at [facility], [address] [place], [place] [address]    Report Status PENDING  Incomplete  Culture, blood (routine x 2)     Status: None (Preliminary result)   Collection Time: 09/17/18 10:02 PM  Result Value Ref Range Status   Specimen Description BLOOD LEFT ANTECUBITAL  Final   Special Requests NONE  Final  Culture   Final    NO GROWTH 2 DAYS Performed at [facility], [address] [place], [place] [address]    Report Status PENDING  Incomplete  MRSA PCR Screening     Status: None   Collection Time: 09/18/18  5:10 AM  Result Value Ref Range Status   MRSA by PCR NEGATIVE NEGATIVE Final    Comment:        The GeneXpert MRSA Assay (FDA approved for NASAL specimens only), is one component of a comprehensive MRSA colonization surveillance program. It is not intended to diagnose MRSA infection nor to guide or monitor treatment for MRSA infections. Performed at [facility], [address] [place], [place] [address]     Coagulation Studies: No results for input(s): LABPROT, INR in the last 72 hours.  Urinalysis: No results for input(s): COLORURINE, LABSPEC,  PHURINE, GLUCOSEU, HGBUR, BILIRUBINUR, KETONESUR, PROTEINUR, UROBILINOGEN, NITRITE, LEUKOCYTESUR in the last 72 hours.  Invalid input(s): APPERANCEUR    Imaging: Dg Chest Port 1 View  Result Date: 09/17/2018 CLINICAL DATA:  Initial evaluation for acute shortness of breath. EXAM: PORTABLE CHEST 1 VIEW COMPARISON:  Prior radiograph from 05/03/2018. FINDINGS: Moderate cardiomegaly, stable.  Mediastinal silhouette normal. Lungs normally inflated. Diffuse pulmonary vascular and interstitial congestion, compatible with moderate diffuse pulmonary interstitial edema. Suspected small bilateral pleural effusions. Findings most consistent with CHF. No superimposed consolidative airspace disease. No pneumothorax. Osseous structures demonstrate no acute abnormality. IMPRESSION: Cardiomegaly with moderate diffuse pulmonary interstitial edema and small bilateral pleural effusions, compatible with acute CHF exacerbation. Electronically Signed   By: Jeannine Boga M.D.   On: 09/17/2018 22:40     Medications:   . sodium chloride    . sodium chloride     . amLODipine  10 mg Oral Daily  . aspirin EC  81 mg Oral Daily  . calcitRIOL  0.25 mcg Oral Daily  . calcium acetate  2,001 mg Oral TID WC  . calcium carbonate  2 tablet Oral BID  . carvedilol  25 mg Oral BID WC  . chlorhexidine  15 mL Mouth Rinse BID  . Chlorhexidine Gluconate Cloth  6 each Topical Q0600  . cholecalciferol  2,000 Units Oral Daily  . furosemide  80 mg Intravenous TID  . gentamicin cream  1 application Topical Daily  . hydrALAZINE  25 mg Oral TID  . insulin aspart  0-15 Units Subcutaneous TID WC  . insulin aspart  0-5 Units Subcutaneous QHS  . insulin glargine  20 Units Subcutaneous QHS  . mouth rinse  15 mL Mouth Rinse q12n4p  . multivitamin  1 tablet Oral Daily  . pantoprazole  40 mg Oral Daily  . prasugrel  10 mg Oral Daily   sodium chloride, sodium chloride, acetaminophen **OR** acetaminophen, albuterol, alteplase,  heparin, hydrALAZINE, HYDROcodone-acetaminophen, lidocaine (PF), lidocaine-prilocaine, nicotine, ondansetron **OR** ondansetron (ZOFRAN) IV, pentafluoroprop-tetrafluoroeth, polyethylene glycol  Assessment/ Plan:   Scott [name] is a 58 y.o. white male with end stage renal disease on peritoneal dialysis, congestive heart failure, GERD, hypertension, peripheral vascular disease, diabetes mellitus type 2, diabetic retinopathy, diabetic gastroparesis   Admitted with volume overload, shortness of breath and pulmonary edema.   [facility]/[address]  CAPD 1828mL fills 4 exchanges   1.  ESRD on peritoneal dialysis. 2.  Acute pulmonary edema. 3.  Anemia of chronic kidney disease. 4.  Secondary hyperparathyroidism with hypocalcemia 5.  Hypertension.  Plan:  Tolerated peritoneal dialysis well last night. 4% dextrose.  Continue high dose IV furosemide.  Add calcium carbonate for hypocalcemia.    LOS: 2 Almeta Geisel 1/20/20209:09 AM

## 2018-09-19 NOTE — Progress Notes (Signed)
Patient given discharged instructions- IV and telemetry removed.  Patient transported out with wheelchair by our nurse tech [place].

## 2018-09-19 NOTE — Progress Notes (Signed)
Post PD assessment. Pt tolerated tx well. Post PD weight 101.5kg, I-Drain volume 1347ml, Net UF 3849, avg dwell 2 hr 26 minutes, lost dwell 23 minutes.    09/19/18 0953  Cycler Setup  Pause Option Status Completed  Completion  Weight after Drain 223 lb 12.3 oz (101.5 kg)  Effluent Appearance Clear;Yellow  Treatment Status Complete  Fluid Balance - CCPD  Total Output for Exchanges (mL) 3849 ml  Procedure Comments  Tolerated treatment well? Yes  Education / Care Plan  Dialysis Education Provided Yes  Documented Education in Care Plan Yes  Hand-Off documentation  Report given to (Full Name) Kateri Mc  Report received from (Full Name) Stark Bray

## 2018-09-19 NOTE — Discharge Summary (Signed)
[facility] [place] NAME: Scott [name]    MR#:  [id number]  DATE OF BIRTH:  [birth year]  DATE OF ADMISSION:  09/17/2018 ADMITTING PHYSICIAN: Sela Hua, MD  DATE OF DISCHARGE: 09/19/2018  PRIMARY CARE PHYSICIAN: [facility] [place]   ADMISSION DIAGNOSIS:  Respiratory distress [R06.03] SOB (shortness of breath) [R06.02] Acute respiratory failure with hypoxia (HCC) [J96.01]  DISCHARGE DIAGNOSIS:  Active Problems:   Acute respiratory failure with hypoxia (HCC) Fluid overload End-stage renal disease on peritoneal dialysis Hypertension Coronary artery disease Type 2 diabetes mellitus  SECONDARY DIAGNOSIS:   Past Medical History:  Diagnosis Date  . Arthritis    "left arm; right leg" (12/13/2014)  . Asthma   . CHF (congestive heart failure) ([place])   . Chronic disease anemia    [name] 12/13/2014  . Chronic kidney disease (CKD), stage IV (severe) ([place])    [name] 12/13/2014... on dialysis  . Complication of anesthesia    unable to urinate after CAPD urgery  . Continuous ambulatory peritoneal dialysis status ([place])   . Coronary artery disease    [name] 12/13/2014  . Depression   . Dysrhythmia    patient unaware of irregular heartbeat  . GERD (gastroesophageal reflux disease)   . High cholesterol    [name] 12/13/2014  . Hypertension   . Non-Q wave myocardial infarction ([place])    [name] 12/13/2014  . PVD (peripheral vascular disease) ([place])    [name] 12/13/2014  . Type II diabetes mellitus ([place])      ADMITTING HISTORY Scott [name]  is a 58 y.o. male with a known history of hypertension, chronic diastolic heart failure, ESRD on peritoneal dialysis, asthma, CAD, hyperlipidemia, type 2 diabetes who presented to the ED with shortness of breath that started yesterday.  He states he has been eating more salt than normal.  He took off an extra liter for his peritoneal dialysis this morning, which helped a little bit.  He states that  his shortness of breath is worse with movement.  He endorses orthopnea and worsening lower extremity edema.  He denies any fevers or cough.  He denies chest pain.In the ED, he was hypoxic to 81% on room air.  He was placed on 5 L O2, but continued to have pretty significant work of breathing.  He was then placed on BiPAP.  Initial blood pressure 219/107.  Labs are significant for BNP 521.  Chest x-ray with moderate diffuse pulmonary interstitial edema and small bilateral pleural effusions, compatible with acute CHF exacerbation. He was given Lasix 80 mg IV x1, as he produces urine.  Hospitalists were called for admission.  HOSPITAL COURSE:  Patient was admitted to stepdown on BiPAP for respiratory distress and hypoxia.  He received IV Lasix 80 mg for diuresis.  Usually takes 40 mg of Lasix orally at home.  Patient was weaned of BiPAP to nasal cannula and then to room air.  Was seen by nephrology attending during hospitalization.  Patient had dialysis and excess fluid was removed.  Felt comfortable.  Patient will be discharged to home with increased dose of Lasix that is 80 mg orally daily.  Follow-up with nephrology in the clinic.  CONSULTS OBTAINED:  Treatment Team:  Anthonette Legato, MD  DRUG ALLERGIES:  No Known Allergies  DISCHARGE MEDICATIONS:   Allergies as of 09/19/2018   No Known Allergies     Medication List    TAKE these medications   albuterol 108 (90 Base) MCG/ACT inhaler Commonly  known as:  PROVENTIL HFA;VENTOLIN HFA Inhale 2 puffs into the lungs every 6 (six) hours as needed for wheezing or shortness of breath.   amLODipine 10 MG tablet Commonly known as:  NORVASC Take 10 mg by mouth daily.   aspirin EC 81 MG tablet Take 1 tablet (81 mg total) by mouth daily.   calcitRIOL 0.25 MCG capsule Commonly known as:  ROCALTROL Take 0.25 mcg by mouth daily. What changed:  Another medication with the same name was removed. Continue taking this medication, and follow the directions  you see here.   calcium acetate 667 MG capsule Commonly known as:  PHOSLO Take 2,001 mg by mouth 3 (three) times daily with meals. Takes 1-2 caps if he has snacks   carvedilol 25 MG tablet Commonly known as:  COREG Take 1 tablet (25 mg total) by mouth 2 (two) times daily with a meal.   DIALYVITE 800 0.8 MG Tabs Take 1 tablet by mouth daily.   furosemide 40 MG tablet Commonly known as:  LASIX Take 2 tablets (80 mg total) by mouth daily for 30 days. What changed:  how much to take   gentamicin cream 0.1 % Commonly known as:  GARAMYCIN Apply 1 application topically daily.   hydrALAZINE 25 MG tablet Commonly known as:  APRESOLINE Take 1 tablet (25 mg total) by mouth 3 (three) times daily. What changed:  additional instructions   HYDROcodone-acetaminophen 5-325 MG tablet Commonly known as:  NORCO/VICODIN Take 1-2 tablets by mouth every 4 (four) hours as needed for moderate pain.   insulin aspart protamine- aspart (70-30) 100 UNIT/ML injection Commonly known as:  NOVOLOG MIX 70/30 Inject 0.25 mLs (25 Units total) into the skin 2 (two) times daily with a meal. What changed:  how much to take   nitroGLYCERIN 0.4 MG SL tablet Commonly known as:  NITROSTAT Place 0.4 mg under the tongue every 5 (five) minutes as needed for chest pain. Pt needs new Rx. Bottle has expried   omeprazole 20 MG capsule Commonly known as:  PRILOSEC Take 1 capsule (20 mg total) by mouth daily.   ONE TOUCH ULTRA TEST test strip Generic drug:  glucose blood   prasugrel 10 MG Tabs tablet Commonly known as:  EFFIENT Take 10 mg by mouth daily.   Vitamin D 50 MCG (2000 UT) tablet Take 2,000 Units by mouth daily. Unsure how many units       Today  Patient seen on the day of discharge Comfortable breathing on room air No shortness of breath No fever Tolerating diet well Hemodynamically stable will be discharged home  VITAL SIGNS:  Blood pressure 136/79, pulse 72, temperature 97.6 F (36.4 C),  temperature source Oral, resp. rate 20, height 5\' 9"  (1.753 m), weight 101.8 kg, SpO2 94 %.  I/O:    Intake/Output Summary (Last 24 hours) at 09/19/2018 1153 Last data filed at 09/19/2018 0953 Gross per 24 hour  Intake 1200 ml  Output 4676 ml  Net -3476 ml    PHYSICAL EXAMINATION:  Physical Exam  GENERAL:  58 y.o.-year-old patient lying in the bed with no acute distress.  LUNGS: Normal breath sounds bilaterally, no wheezing, rales,rhonchi or crepitation. No use of accessory muscles of respiration.  CARDIOVASCULAR: S1, S2 normal. No murmurs, rubs, or gallops.  ABDOMEN: Soft, non-tender, non-distended. Bowel sounds present. No organomegaly or mass.  NEUROLOGIC: Moves all 4 extremities. PSYCHIATRIC: The patient is alert and oriented x 3.  SKIN: No obvious rash, lesion, or ulcer.   DATA REVIEW:  CBC Recent Labs  Lab 09/18/18 0450  WBC 9.5  HGB 10.2*  HCT 32.9*  PLT 226    Chemistries  Recent Labs  Lab 09/17/18 2156 09/18/18 0450  NA 141 138  K 4.4 4.9  CL 111 110  CO2 19* 19*  GLUCOSE 136* 186*  BUN 56* 63*  CREATININE 6.89* 7.04*  CALCIUM 6.8* 6.6*  MG  --  2.0  AST 17  --   ALT 15  --   ALKPHOS 101  --   BILITOT 0.8  --     Cardiac Enzymes Recent Labs  Lab 09/17/18 2156  TROPONINI <0.03    Microbiology Results  Results for orders placed or performed during the hospital encounter of 09/17/18  Culture, blood (routine x 2)     Status: None (Preliminary result)   Collection Time: 09/17/18  9:55 PM  Result Value Ref Range Status   Specimen Description BLOOD LEFT FATTY CASTS  Final   Special Requests   Final    BOTTLES DRAWN AEROBIC AND ANAEROBIC Blood Culture results may not be optimal due to an excessive volume of blood received in culture bottles   Culture   Final    NO GROWTH 2 DAYS Performed at [facility], [address], [place], [place] [address]    Report Status PENDING  Incomplete  Culture, blood (routine x 2)     Status: None  (Preliminary result)   Collection Time: 09/17/18 10:02 PM  Result Value Ref Range Status   Specimen Description BLOOD LEFT ANTECUBITAL  Final   Special Requests NONE  Final   Culture   Final    NO GROWTH 2 DAYS Performed at [facility], [address] [place], [place] [address]    Report Status PENDING  Incomplete  MRSA PCR Screening     Status: None   Collection Time: 09/18/18  5:10 AM  Result Value Ref Range Status   MRSA by PCR NEGATIVE NEGATIVE Final    Comment:        The GeneXpert MRSA Assay (FDA approved for NASAL specimens only), is one component of a comprehensive MRSA colonization surveillance program. It is not intended to diagnose MRSA infection nor to guide or monitor treatment for MRSA infections. Performed at [facility], [address], [place], [place] [address]     RADIOLOGY:  Dg Chest Port 1 View  Result Date: 09/17/2018 CLINICAL DATA:  Initial evaluation for acute shortness of breath. EXAM: PORTABLE CHEST 1 VIEW COMPARISON:  Prior radiograph from 05/03/2018. FINDINGS: Moderate cardiomegaly, stable.  Mediastinal silhouette normal. Lungs normally inflated. Diffuse pulmonary vascular and interstitial congestion, compatible with moderate diffuse pulmonary interstitial edema. Suspected small bilateral pleural effusions. Findings most consistent with CHF. No superimposed consolidative airspace disease. No pneumothorax. Osseous structures demonstrate no acute abnormality. IMPRESSION: Cardiomegaly with moderate diffuse pulmonary interstitial edema and small bilateral pleural effusions, compatible with acute CHF exacerbation. Electronically Signed   By: Jeannine Boga M.D.   On: 09/17/2018 22:40    Follow up with PCP in 1 week.  Management plans discussed with the patient, family and they are in agreement.  CODE STATUS: Full code    Code Status Orders  (From admission, onward)         Start     Ordered   09/18/18 0023  Full code   Continuous     09/18/18 0022        Code Status History    Date Active Date Inactive Code  Status Order ID Comments User Context   03/09/2018 1206 03/12/2018 1712 Full Code [id number]  Henreitta Leber, MD Inpatient   01/17/2017 0338 01/20/2017 1834 Full Code [id number]  Harrie Foreman, MD ED   11/13/2016 1013 11/20/2016 1256 Full Code [id number]  Hillary Bow, MD ED   01/09/2015 1019 01/10/2015 1518 Full Code [id number]  Max Sane, MD Inpatient   12/18/2014 1620 12/19/2014 1627 Full Code [id number]  Charolette Forward, MD Inpatient   12/14/2014 1600 12/18/2014 1620 Full Code [id number]  Charolette Forward, MD Inpatient   12/13/2014 1324 12/14/2014 1600 Full Code [id number]  Charolette Forward, MD Inpatient      TOTAL TIME TAKING CARE OF THIS PATIENT ON DAY OF DISCHARGE: more than 35 minutes.   Saundra Shelling M.D on 09/19/2018 at 11:53 AM  Between 7am to 6pm - Pager - [phone]  After 6pm go to [email or web address] - password [email or web address] [facility]  Office  [phone]  CC: Primary care physician; [employer]  Note: This dictation was prepared with Diplomatic Services operational officer dictation along with smaller Company secretary. Any transcriptional errors that result from this process are unintentional.

## 2018-09-19 NOTE — Care Management Note (Signed)
Case Management Note  Patient Details  Name: Scott [name] MRN: [id number] Date of Birth: 08-08-1961  Subjective/Objective:    Patient admitted with acute respiratory failure with hypoxia.  Patient is ready for discharge today and discharge orders are in.  Patient is from home where he lives with his girlfriend.  Patient reports that he is independent in ADL's.  Patient does peritoneal dialysis at home and reports that it is going well.  No discharge needs identified.  Girlfriend will provide transportation at discharge. Doran Clay RN BSN [phone]                 Action/Plan:   Expected Discharge Date:  09/19/18               Expected Discharge Plan:  Home/Self Care  In-House Referral:     Discharge planning Services     Post Acute Care Choice:    Choice offered to:     DME Arranged:    DME Agency:     HH Arranged:    HH Agency:     Status of Service:  Completed, signed off  If discussed at [employer] of Stay Meetings, dates discussed:    Additional Comments:  Shelbie Hutching, RN 09/19/2018, 11:24 AM

## 2018-09-20 LAB — PARATHYROID HORMONE, INTACT (NO CA): PTH: 164 pg/mL — ABNORMAL HIGH (ref 15–65)

## 2018-09-22 LAB — CULTURE, BLOOD (ROUTINE X 2)
CULTURE: NO GROWTH
Culture: NO GROWTH

## 2019-01-21 DIAGNOSIS — L0390 Cellulitis, unspecified: Secondary | ICD-10-CM | POA: Insufficient documentation

## 2019-01-27 MED ORDER — CARVEDILOL 25 MG PO TABS
25.00 | ORAL_TABLET | ORAL | Status: DC
Start: 2019-01-26 — End: 2019-01-27

## 2019-01-27 MED ORDER — Medication
1.00 | Status: DC
Start: ? — End: 2019-01-27

## 2019-01-27 MED ORDER — NEXTERONE IV
81.00 | INTRAVENOUS | Status: DC
Start: 2019-01-26 — End: 2019-01-27

## 2019-01-27 MED ORDER — ATORVASTATIN CALCIUM 40 MG PO TABS
40.00 | ORAL_TABLET | ORAL | Status: DC
Start: 2019-01-26 — End: 2019-01-27

## 2019-01-27 MED ORDER — Medication
Status: DC
Start: ? — End: 2019-01-27

## 2019-01-27 MED ORDER — FUROSEMIDE 80 MG PO TABS
80.00 | ORAL_TABLET | ORAL | Status: DC
Start: 2019-01-26 — End: 2019-01-27

## 2019-01-27 MED ORDER — AMLODIPINE BESYLATE 10 MG PO TABS
10.00 | ORAL_TABLET | ORAL | Status: DC
Start: 2019-01-26 — End: 2019-01-27

## 2019-01-27 MED ORDER — PANTOPRAZOLE SODIUM 20 MG PO TBEC
20.00 | DELAYED_RELEASE_TABLET | ORAL | Status: DC
Start: 2019-01-26 — End: 2019-01-27

## 2019-01-27 MED ORDER — CALCIUM ACETATE (PHOS BINDER) 667 MG PO CAPS
1334.00 | ORAL_CAPSULE | ORAL | Status: DC
Start: 2019-01-25 — End: 2019-01-27

## 2019-01-27 MED ORDER — METRONIDAZOLE 500 MG PO TABS
500.00 | ORAL_TABLET | ORAL | Status: DC
Start: 2019-01-25 — End: 2019-01-27

## 2019-01-27 MED ORDER — PRASUGREL HCL 10 MG PO TABS
10.00 | ORAL_TABLET | ORAL | Status: DC
Start: 2019-01-26 — End: 2019-01-27

## 2019-01-27 MED ORDER — ACETAMINOPHEN 325 MG PO TABS
650.00 | ORAL_TABLET | ORAL | Status: DC
Start: ? — End: 2019-01-27

## 2019-01-27 MED ORDER — HEPARIN SODIUM LOCK FLUSH 100 UNIT/ML IV SOLN
200.00 | INTRAVENOUS | Status: DC
Start: 2019-01-27 — End: 2019-01-27

## 2019-01-27 MED ORDER — GENERIC EXTERNAL MEDICATION
Status: DC
Start: ? — End: 2019-01-27

## 2019-01-27 MED ORDER — ALBUTEROL SULFATE (2.5 MG/3ML) 0.083% IN NEBU
2.50 | INHALATION_SOLUTION | RESPIRATORY_TRACT | Status: DC
Start: ? — End: 2019-01-27

## 2019-01-27 MED ORDER — HEPARIN SODIUM (PORCINE) 5000 UNIT/ML IJ SOLN
5000.00 | INTRAMUSCULAR | Status: DC
Start: 2019-01-25 — End: 2019-01-27

## 2019-04-15 DIAGNOSIS — D649 Anemia, unspecified: Secondary | ICD-10-CM | POA: Insufficient documentation

## 2019-04-21 MED ORDER — ASPIRIN 81 MG PO CHEW
81.00 | CHEWABLE_TABLET | ORAL | Status: DC
Start: 2019-04-20 — End: 2019-04-21

## 2019-04-21 MED ORDER — CALCITRIOL 0.25 MCG PO CAPS
0.50 | ORAL_CAPSULE | ORAL | Status: DC
Start: 2019-04-20 — End: 2019-04-21

## 2019-04-21 MED ORDER — FUROSEMIDE 80 MG PO TABS
80.00 | ORAL_TABLET | ORAL | Status: DC
Start: 2019-04-20 — End: 2019-04-21

## 2019-04-21 MED ORDER — ATORVASTATIN CALCIUM 40 MG PO TABS
40.00 | ORAL_TABLET | ORAL | Status: DC
Start: 2019-04-20 — End: 2019-04-21

## 2019-04-21 MED ORDER — ACCU-PRO PUMP SET/VENT MISC
2.50 | Status: DC
Start: ? — End: 2019-04-21

## 2019-04-21 MED ORDER — TOTALDAY MULTIPLE PO
15.00 | ORAL | Status: DC
Start: ? — End: 2019-04-21

## 2019-04-21 MED ORDER — PRASUGREL HCL 10 MG PO TABS
10.00 | ORAL_TABLET | ORAL | Status: DC
Start: 2019-04-20 — End: 2019-04-21

## 2019-04-21 MED ORDER — QUINERVA 260 MG PO TABS
650.00 | ORAL_TABLET | ORAL | Status: DC
Start: ? — End: 2019-04-21

## 2019-04-21 MED ORDER — ISOVUE-M 300 61 % IJ SOLN
4.00 | INTRAMUSCULAR | Status: DC
Start: ? — End: 2019-04-21

## 2019-04-21 MED ORDER — POLYETHYLENE GLYCOL 3350 17 G PO PACK
17.00 | PACK | ORAL | Status: DC
Start: 2019-04-20 — End: 2019-04-21

## 2019-04-21 MED ORDER — GENERIC EXTERNAL MEDICATION
1.00 | Status: DC
Start: 2019-04-20 — End: 2019-04-21

## 2019-04-21 MED ORDER — HEPARIN SODIUM (PORCINE) 5000 UNIT/ML IJ SOLN
5000.00 | INTRAMUSCULAR | Status: DC
Start: 2019-04-19 — End: 2019-04-21

## 2019-04-21 MED ORDER — DESMOPRESSIN ACE SPRAY REFRIG
20.00 | Status: DC
Start: 2019-04-20 — End: 2019-04-21

## 2019-04-21 MED ORDER — GENERIC EXTERNAL MEDICATION
Status: DC
Start: ? — End: 2019-04-21

## 2019-04-21 MED ORDER — METRONIDAZOLE 500 MG PO TABS
500.00 | ORAL_TABLET | ORAL | Status: DC
Start: 2019-04-19 — End: 2019-04-21

## 2019-04-21 MED ORDER — Medication
Status: DC
Start: ? — End: 2019-04-21

## 2019-04-21 MED ORDER — CALCIUM ACETATE (PHOS BINDER) 667 MG PO CAPS
1334.00 | ORAL_CAPSULE | ORAL | Status: DC
Start: 2019-04-19 — End: 2019-04-21

## 2019-04-21 MED ORDER — AMLODIPINE BESYLATE 10 MG PO TABS
10.00 | ORAL_TABLET | ORAL | Status: DC
Start: 2019-04-20 — End: 2019-04-21

## 2019-04-21 MED ORDER — CARVEDILOL 25 MG PO TABS
25.00 | ORAL_TABLET | ORAL | Status: DC
Start: 2019-04-19 — End: 2019-04-21

## 2019-04-21 MED ORDER — FLUCONAZOLE 200 MG PO TABS
100.00 | ORAL_TABLET | ORAL | Status: DC
Start: 2019-04-19 — End: 2019-04-21

## 2019-04-21 MED ORDER — OSCO ANTI-NAUSEA 1.87-1.87-21.5 OR SOLN
25.00 | ORAL | Status: DC
Start: 2019-04-19 — End: 2019-04-21

## 2019-05-22 ENCOUNTER — Inpatient Hospital Stay
Admission: EM | Admit: 2019-05-22 | Discharge: 2019-05-26 | DRG: 270 | Disposition: A | Discharge status: Home Health | Payer: Medicare Other | Attending: Internal Medicine | Admitting: Internal Medicine

## 2019-05-22 ENCOUNTER — Encounter: Payer: Self-pay | Admitting: Emergency Medicine

## 2019-05-22 ENCOUNTER — Emergency Department: Payer: Medicare Other

## 2019-05-22 ENCOUNTER — Other Ambulatory Visit: Payer: Self-pay

## 2019-05-22 DIAGNOSIS — N2581 Secondary hyperparathyroidism of renal origin: Secondary | ICD-10-CM | POA: Diagnosis present

## 2019-05-22 DIAGNOSIS — E1122 Type 2 diabetes mellitus with diabetic chronic kidney disease: Secondary | ICD-10-CM | POA: Diagnosis present

## 2019-05-22 DIAGNOSIS — K219 Gastro-esophageal reflux disease without esophagitis: Secondary | ICD-10-CM | POA: Diagnosis present

## 2019-05-22 DIAGNOSIS — D631 Anemia in chronic kidney disease: Secondary | ICD-10-CM | POA: Diagnosis present

## 2019-05-22 DIAGNOSIS — I509 Heart failure, unspecified: Secondary | ICD-10-CM | POA: Diagnosis present

## 2019-05-22 DIAGNOSIS — L97429 Non-pressure chronic ulcer of left heel and midfoot with unspecified severity: Secondary | ICD-10-CM | POA: Diagnosis present

## 2019-05-22 DIAGNOSIS — I132 Hypertensive heart and chronic kidney disease with heart failure and with stage 5 chronic kidney disease, or end stage renal disease: Secondary | ICD-10-CM | POA: Diagnosis present

## 2019-05-22 DIAGNOSIS — E1142 Type 2 diabetes mellitus with diabetic polyneuropathy: Secondary | ICD-10-CM | POA: Diagnosis present

## 2019-05-22 DIAGNOSIS — Z7902 Long term (current) use of antithrombotics/antiplatelets: Secondary | ICD-10-CM

## 2019-05-22 DIAGNOSIS — N186 End stage renal disease: Secondary | ICD-10-CM | POA: Diagnosis present

## 2019-05-22 DIAGNOSIS — E11621 Type 2 diabetes mellitus with foot ulcer: Secondary | ICD-10-CM | POA: Diagnosis present

## 2019-05-22 DIAGNOSIS — F329 Major depressive disorder, single episode, unspecified: Secondary | ICD-10-CM | POA: Diagnosis present

## 2019-05-22 DIAGNOSIS — E1169 Type 2 diabetes mellitus with other specified complication: Secondary | ICD-10-CM | POA: Diagnosis present

## 2019-05-22 DIAGNOSIS — E11319 Type 2 diabetes mellitus with unspecified diabetic retinopathy without macular edema: Secondary | ICD-10-CM | POA: Diagnosis present

## 2019-05-22 DIAGNOSIS — A419 Sepsis, unspecified organism: Secondary | ICD-10-CM

## 2019-05-22 DIAGNOSIS — I252 Old myocardial infarction: Secondary | ICD-10-CM

## 2019-05-22 DIAGNOSIS — L03116 Cellulitis of left lower limb: Secondary | ICD-10-CM | POA: Diagnosis present

## 2019-05-22 DIAGNOSIS — Z7982 Long term (current) use of aspirin: Secondary | ICD-10-CM

## 2019-05-22 DIAGNOSIS — E1152 Type 2 diabetes mellitus with diabetic peripheral angiopathy with gangrene: Principal | ICD-10-CM | POA: Diagnosis present

## 2019-05-22 DIAGNOSIS — Z79899 Other long term (current) drug therapy: Secondary | ICD-10-CM

## 2019-05-22 DIAGNOSIS — Z8249 Family history of ischemic heart disease and other diseases of the circulatory system: Secondary | ICD-10-CM

## 2019-05-22 DIAGNOSIS — Z20828 Contact with and (suspected) exposure to other viral communicable diseases: Secondary | ICD-10-CM | POA: Diagnosis present

## 2019-05-22 DIAGNOSIS — L97512 Non-pressure chronic ulcer of other part of right foot with fat layer exposed: Secondary | ICD-10-CM | POA: Diagnosis present

## 2019-05-22 DIAGNOSIS — I739 Peripheral vascular disease, unspecified: Secondary | ICD-10-CM

## 2019-05-22 DIAGNOSIS — E669 Obesity, unspecified: Secondary | ICD-10-CM | POA: Diagnosis present

## 2019-05-22 DIAGNOSIS — E7800 Pure hypercholesterolemia, unspecified: Secondary | ICD-10-CM | POA: Diagnosis present

## 2019-05-22 DIAGNOSIS — M79675 Pain in left toe(s): Secondary | ICD-10-CM | POA: Diagnosis present

## 2019-05-22 DIAGNOSIS — E1143 Type 2 diabetes mellitus with diabetic autonomic (poly)neuropathy: Secondary | ICD-10-CM | POA: Diagnosis present

## 2019-05-22 DIAGNOSIS — Z992 Dependence on renal dialysis: Secondary | ICD-10-CM

## 2019-05-22 DIAGNOSIS — J45909 Unspecified asthma, uncomplicated: Secondary | ICD-10-CM | POA: Diagnosis present

## 2019-05-22 DIAGNOSIS — I700 Atherosclerosis of aorta: Secondary | ICD-10-CM | POA: Diagnosis present

## 2019-05-22 DIAGNOSIS — Z79891 Long term (current) use of opiate analgesic: Secondary | ICD-10-CM

## 2019-05-22 DIAGNOSIS — M869 Osteomyelitis, unspecified: Secondary | ICD-10-CM | POA: Diagnosis present

## 2019-05-22 DIAGNOSIS — I2510 Atherosclerotic heart disease of native coronary artery without angina pectoris: Secondary | ICD-10-CM | POA: Diagnosis present

## 2019-05-22 DIAGNOSIS — Z87891 Personal history of nicotine dependence: Secondary | ICD-10-CM

## 2019-05-22 DIAGNOSIS — Z833 Family history of diabetes mellitus: Secondary | ICD-10-CM

## 2019-05-22 DIAGNOSIS — Z09 Encounter for follow-up examination after completed treatment for conditions other than malignant neoplasm: Secondary | ICD-10-CM

## 2019-05-22 DIAGNOSIS — I96 Gangrene, not elsewhere classified: Secondary | ICD-10-CM

## 2019-05-22 DIAGNOSIS — Z955 Presence of coronary angioplasty implant and graft: Secondary | ICD-10-CM

## 2019-05-22 DIAGNOSIS — S98131A Complete traumatic amputation of one right lesser toe, initial encounter: Secondary | ICD-10-CM | POA: Diagnosis present

## 2019-05-22 DIAGNOSIS — Z9119 Patient's noncompliance with other medical treatment and regimen: Secondary | ICD-10-CM

## 2019-05-22 DIAGNOSIS — Z801 Family history of malignant neoplasm of trachea, bronchus and lung: Secondary | ICD-10-CM

## 2019-05-22 DIAGNOSIS — I70262 Atherosclerosis of native arteries of extremities with gangrene, left leg: Secondary | ICD-10-CM | POA: Diagnosis not present

## 2019-05-22 LAB — LACTIC ACID, PLASMA
Lactic Acid, Venous: 1.1 mmol/L (ref 0.5–1.9)
Lactic Acid, Venous: 1.2 mmol/L (ref 0.5–1.9)

## 2019-05-22 LAB — SEDIMENTATION RATE: Sed Rate: 126 mm/hr — ABNORMAL HIGH (ref 0–20)

## 2019-05-22 LAB — GLUCOSE, CAPILLARY: Glucose-Capillary: 174 mg/dL — ABNORMAL HIGH (ref 70–99)

## 2019-05-22 LAB — HEMOGLOBIN A1C
Hgb A1c MFr Bld: 6.1 % — ABNORMAL HIGH (ref 4.8–5.6)
Mean Plasma Glucose: 128.37 mg/dL

## 2019-05-22 LAB — CBC WITH DIFFERENTIAL/PLATELET
Abs Immature Granulocytes: 0.47 10*3/uL — ABNORMAL HIGH (ref 0.00–0.07)
Basophils Absolute: 0.1 10*3/uL (ref 0.0–0.1)
Basophils Relative: 0 %
Eosinophils Absolute: 0.3 10*3/uL (ref 0.0–0.5)
Eosinophils Relative: 1 %
HCT: 24.7 % — ABNORMAL LOW (ref 39.0–52.0)
Hemoglobin: 8.1 g/dL — ABNORMAL LOW (ref 13.0–17.0)
Immature Granulocytes: 2 %
Lymphocytes Relative: 3 %
Lymphs Abs: 0.6 10*3/uL — ABNORMAL LOW (ref 0.7–4.0)
MCH: 31.3 pg (ref 26.0–34.0)
MCHC: 32.8 g/dL (ref 30.0–36.0)
MCV: 95.4 fL (ref 80.0–100.0)
Monocytes Absolute: 1.5 10*3/uL — ABNORMAL HIGH (ref 0.1–1.0)
Monocytes Relative: 6 %
Neutro Abs: 20.5 10*3/uL — ABNORMAL HIGH (ref 1.7–7.7)
Neutrophils Relative %: 88 %
Platelets: 398 10*3/uL (ref 150–400)
RBC: 2.59 MIL/uL — ABNORMAL LOW (ref 4.22–5.81)
RDW: 13 % (ref 11.5–15.5)
WBC: 23.4 10*3/uL — ABNORMAL HIGH (ref 4.0–10.5)
nRBC: 0 % (ref 0.0–0.2)

## 2019-05-22 LAB — SARS CORONAVIRUS 2 BY RT PCR (HOSPITAL ORDER, PERFORMED IN ~~LOC~~ HOSPITAL LAB): SARS Coronavirus 2: NEGATIVE

## 2019-05-22 LAB — COMPREHENSIVE METABOLIC PANEL
ALT: 34 U/L (ref 0–44)
AST: 30 U/L (ref 15–41)
Albumin: 2.5 g/dL — ABNORMAL LOW (ref 3.5–5.0)
Alkaline Phosphatase: 139 U/L — ABNORMAL HIGH (ref 38–126)
Anion gap: 17 — ABNORMAL HIGH (ref 5–15)
BUN: 61 mg/dL — ABNORMAL HIGH (ref 6–20)
CO2: 21 mmol/L — ABNORMAL LOW (ref 22–32)
Calcium: 7.4 mg/dL — ABNORMAL LOW (ref 8.9–10.3)
Chloride: 99 mmol/L (ref 98–111)
Creatinine, Ser: 8.72 mg/dL — ABNORMAL HIGH (ref 0.61–1.24)
GFR calc Af Amer: 7 mL/min — ABNORMAL LOW (ref >60)
GFR calc non Af Amer: 6 mL/min — ABNORMAL LOW (ref >60)
Glucose, Bld: 189 mg/dL — ABNORMAL HIGH (ref 70–99)
Potassium: 3.4 mmol/L — ABNORMAL LOW (ref 3.5–5.1)
Sodium: 137 mmol/L (ref 135–145)
Total Bilirubin: 0.5 mg/dL (ref 0.3–1.2)
Total Protein: 6.8 g/dL (ref 6.5–8.1)

## 2019-05-22 LAB — CREATININE, SERUM
Creatinine, Ser: 8.89 mg/dL — ABNORMAL HIGH (ref 0.61–1.24)
GFR calc Af Amer: 7 mL/min — ABNORMAL LOW (ref >60)
GFR calc non Af Amer: 6 mL/min — ABNORMAL LOW (ref >60)

## 2019-05-22 LAB — C-REACTIVE PROTEIN: CRP: 25.9 mg/dL — ABNORMAL HIGH (ref <1.0)

## 2019-05-22 MED ORDER — CARVEDILOL 25 MG PO TABS
25.0000 mg | ORAL_TABLET | Freq: Two times a day (BID) | ORAL | Status: DC
  Administered 2019-05-23 – 2019-05-26 (×4): 25 mg via ORAL
  Filled 2019-05-22 (×5): qty 1

## 2019-05-22 MED ORDER — ONDANSETRON HCL 4 MG PO TABS: 4.0000 mg | ORAL_TABLET | Freq: Four times a day (QID) | ORAL | Status: DC | PRN

## 2019-05-22 MED ORDER — CALCIUM ACETATE (PHOS BINDER) 667 MG PO CAPS
2001.0000 mg | ORAL_CAPSULE | Freq: Three times a day (TID) | ORAL | Status: DC
  Administered 2019-05-23 – 2019-05-26 (×7): 2001 mg via ORAL
  Filled 2019-05-22 (×8): qty 3

## 2019-05-22 MED ORDER — ALBUTEROL SULFATE (2.5 MG/3ML) 0.083% IN NEBU: 2.5000 mg | INHALATION_SOLUTION | RESPIRATORY_TRACT | Status: DC | PRN

## 2019-05-22 MED ORDER — HYDROMORPHONE HCL 1 MG/ML IJ SOLN
1.0000 mg | Freq: Once | INTRAMUSCULAR | Status: AC
  Administered 2019-05-22: 1 mg via INTRAVENOUS
  Filled 2019-05-22: qty 1

## 2019-05-22 MED ORDER — BISACODYL 5 MG PO TBEC: 5.0000 mg | DELAYED_RELEASE_TABLET | Freq: Every day | ORAL | Status: DC | PRN

## 2019-05-22 MED ORDER — PANTOPRAZOLE SODIUM 40 MG PO TBEC
40.0000 mg | DELAYED_RELEASE_TABLET | Freq: Every day | ORAL | Status: DC
  Administered 2019-05-24 – 2019-05-26 (×3): 40 mg via ORAL
  Filled 2019-05-22 (×3): qty 1

## 2019-05-22 MED ORDER — ONDANSETRON HCL 4 MG/2ML IJ SOLN: 4.0000 mg | Freq: Four times a day (QID) | INTRAMUSCULAR | Status: DC | PRN

## 2019-05-22 MED ORDER — VANCOMYCIN HCL 10 G IV SOLR
2000.0000 mg | Freq: Once | INTRAVENOUS | Status: AC
  Administered 2019-05-22: 2000 mg via INTRAVENOUS
  Filled 2019-05-22: qty 2000

## 2019-05-22 MED ORDER — AMLODIPINE BESYLATE 10 MG PO TABS
10.0000 mg | ORAL_TABLET | Freq: Every day | ORAL | Status: DC
  Administered 2019-05-24 – 2019-05-25 (×2): 10 mg via ORAL
  Filled 2019-05-22 (×2): qty 1

## 2019-05-22 MED ORDER — INSULIN ASPART 100 UNIT/ML ~~LOC~~ SOLN
0.0000 [IU] | Freq: Every day | SUBCUTANEOUS | Status: DC
  Administered 2019-05-25: 2 [IU] via SUBCUTANEOUS
  Filled 2019-05-22: qty 1

## 2019-05-22 MED ORDER — VITAMIN D 25 MCG (1000 UNIT) PO TABS
2000.0000 [IU] | ORAL_TABLET | Freq: Every day | ORAL | Status: DC
  Administered 2019-05-24 – 2019-05-26 (×3): 2000 [IU] via ORAL
  Filled 2019-05-22 (×3): qty 2

## 2019-05-22 MED ORDER — HYDRALAZINE HCL 25 MG PO TABS
25.0000 mg | ORAL_TABLET | Freq: Every day | ORAL | Status: DC
  Administered 2019-05-24 – 2019-05-26 (×3): 25 mg via ORAL
  Filled 2019-05-22 (×4): qty 1

## 2019-05-22 MED ORDER — SENNOSIDES-DOCUSATE SODIUM 8.6-50 MG PO TABS: 1.0000 | ORAL_TABLET | Freq: Every evening | ORAL | Status: DC | PRN

## 2019-05-22 MED ORDER — RENA-VITE PO TABS
1.0000 | ORAL_TABLET | Freq: Every day | ORAL | Status: DC
  Administered 2019-05-24 – 2019-05-26 (×3): 1 via ORAL
  Filled 2019-05-22 (×3): qty 1

## 2019-05-22 MED ORDER — CALCITRIOL 0.25 MCG PO CAPS
0.2500 ug | ORAL_CAPSULE | Freq: Every day | ORAL | Status: DC
  Administered 2019-05-24 – 2019-05-26 (×3): 0.25 ug via ORAL
  Filled 2019-05-22 (×5): qty 1

## 2019-05-22 MED ORDER — NITROGLYCERIN 0.4 MG SL SUBL: 0.4000 mg | SUBLINGUAL_TABLET | SUBLINGUAL | Status: DC | PRN

## 2019-05-22 MED ORDER — HEPARIN SODIUM (PORCINE) 5000 UNIT/ML IJ SOLN
5000.0000 [IU] | Freq: Three times a day (TID) | INTRAMUSCULAR | Status: DC
  Administered 2019-05-22 – 2019-05-26 (×9): 5000 [IU] via SUBCUTANEOUS
  Filled 2019-05-22 (×8): qty 1

## 2019-05-22 MED ORDER — ACETAMINOPHEN 650 MG RE SUPP: 650.0000 mg | Freq: Four times a day (QID) | RECTAL | Status: DC | PRN

## 2019-05-22 MED ORDER — PIPERACILLIN-TAZOBACTAM 3.375 G IVPB
3.3750 g | Freq: Once | INTRAVENOUS | Status: AC
  Administered 2019-05-22: 3.375 g via INTRAVENOUS
  Filled 2019-05-22: qty 50

## 2019-05-22 MED ORDER — HYDROCODONE-ACETAMINOPHEN 5-325 MG PO TABS
1.0000 | ORAL_TABLET | ORAL | Status: DC | PRN
  Administered 2019-05-22: 2 via ORAL
  Administered 2019-05-25 (×2): 1 via ORAL
  Administered 2019-05-26 (×2): 2 via ORAL
  Filled 2019-05-22: qty 2
  Filled 2019-05-22: qty 1
  Filled 2019-05-22: qty 2
  Filled 2019-05-22: qty 1
  Filled 2019-05-22: qty 2

## 2019-05-22 MED ORDER — CHLORHEXIDINE GLUCONATE CLOTH 2 % EX PADS
6.0000 | MEDICATED_PAD | Freq: Every day | CUTANEOUS | Status: DC
  Administered 2019-05-23 – 2019-05-26 (×5): 6 via TOPICAL

## 2019-05-22 MED ORDER — INSULIN ASPART 100 UNIT/ML ~~LOC~~ SOLN
0.0000 [IU] | Freq: Three times a day (TID) | SUBCUTANEOUS | Status: DC
  Administered 2019-05-23: 18:00:00 1 [IU] via SUBCUTANEOUS
  Administered 2019-05-24: 2 [IU] via SUBCUTANEOUS
  Filled 2019-05-22 (×2): qty 1

## 2019-05-22 MED ORDER — FUROSEMIDE 40 MG PO TABS
80.0000 mg | ORAL_TABLET | Freq: Every day | ORAL | Status: DC
  Administered 2019-05-24 – 2019-05-26 (×3): 80 mg via ORAL
  Filled 2019-05-22 (×3): qty 2

## 2019-05-22 MED ORDER — ACETAMINOPHEN 325 MG PO TABS: 650.0000 mg | ORAL_TABLET | Freq: Four times a day (QID) | ORAL | Status: DC | PRN

## 2019-05-22 MED ORDER — INSULIN ASPART PROT & ASPART (70-30 MIX) 100 UNIT/ML ~~LOC~~ SUSP
25.0000 [IU] | Freq: Two times a day (BID) | SUBCUTANEOUS | Status: DC
  Administered 2019-05-23 – 2019-05-25 (×3): 25 [IU] via SUBCUTANEOUS
  Filled 2019-05-22 (×3): qty 10

## 2019-05-22 NOTE — ED Provider Notes (Signed)
[facility] Emergency Department Provider Note  ____________________________________________   First MD Initiated Contact with Patient 05/22/19 1523     (approximate)  I have reviewed the triage vital signs and the nursing notes.   HISTORY  Chief Complaint Blood Infection and Toe Pain    HPI Scott [name] is a 58 y.o. male  With PMhx below here with CAD, HTN, HLD, ESRD, known severe PVD, here with left great toe pain. Pt states that his sx started 3-4 mo ago with a small black spot on his distal left great toe. He went to [facility] and had revascularization in an attempt to salvage this, apprx "2 months ago or so." He states that over the last 1 week, he's had increasing aching, throbbing, severe pain in his toe with redness and warmth. No drainage. He's had difficulty walking 2/2 this pain. He has noticed occasional odor from the wound. No fever or chills. No other complaints. Pain is worse w/ palpation, movement. No alleviating factors.    Past Medical History:  Diagnosis Date  . Arthritis    "left arm; right leg" (12/13/2014)  . Asthma   . CHF (congestive heart failure) ([place])   . Chronic disease anemia    [name] 12/13/2014  . Chronic kidney disease (CKD), stage IV (severe) ([place])    [name] 12/13/2014... on dialysis  . Complication of anesthesia    unable to urinate after CAPD urgery  . Continuous ambulatory peritoneal dialysis status ([place])   . Coronary artery disease    [name] 12/13/2014  . Depression   . Dysrhythmia    patient unaware of irregular heartbeat  . GERD (gastroesophageal reflux disease)   . High cholesterol    [name] 12/13/2014  . Hypertension   . Non-Q wave myocardial infarction ([place])    [name] 12/13/2014  . PVD (peripheral vascular disease) ([place])    [name] 12/13/2014  . Type II diabetes mellitus [facility])     Patient Active Problem List   Diagnosis Date Noted  . Toe infection 05/22/2019  . Acute respiratory failure with hypoxia ([place])  09/17/2018  . History of colonic polyps   . Positive fecal occult blood test   . End stage renal disease ([place]) 11/25/2016  . Tobacco abuse 11/25/2016  . CHF (congestive heart failure) ([place]) 11/13/2016  . Diabetes ([place]) 09/17/2015  . Decreased renal function 05/23/2015  . GERD (gastroesophageal reflux disease) 04/04/2015  . Hypertension 02/26/2015    Past Surgical History:  Procedure Laterality Date  . AV FISTULA PLACEMENT Right 04/07/2017   Procedure: ARTERIOVENOUS (AV) FISTULA CREATION ( BRACHIALCEPHALIC );  Surgeon: Katha Cabal, MD;  Location: [facility] ORS;  Service: Vascular;  Laterality: Right;  . CAPD INSERTION N/A 04/07/2017   Procedure: LAPAROSCOPIC INSERTION CONTINUOUS AMBULATORY PERITONEAL DIALYSIS  (CAPD) CATHETER;  Surgeon: Katha Cabal, MD;  Location: [facility] ORS;  Service: Vascular;  Laterality: N/A;  . CARDIAC CATHETERIZATION  11/2014  . COLONOSCOPY WITH PROPOFOL N/A 12/01/2017   Procedure: COLONOSCOPY WITH PROPOFOL;  Surgeon: Lin Landsman, MD;  Location: [place];  Service: Endoscopy;  Laterality: N/A;  Diabetic - insulin  . COLONOSCOPY WITH PROPOFOL N/A 12/29/2017   Procedure: COLONOSCOPY WITH PROPOFOL;  Surgeon: Lin Landsman, MD;  Location: [place];  Service: Endoscopy;  Laterality: N/A;  . DIALYSIS/PERMA CATHETER INSERTION N/A 01/18/2017   Procedure: Dialysis/Perma Catheter Insertion;  Surgeon: Algernon Huxley, MD;  Location: [place] CV LAB;  Service: Cardiovascular;  Laterality: N/A;  . DIALYSIS/PERMA CATHETER  REMOVAL N/A 07/26/2017   Procedure: DIALYSIS/PERMA CATHETER REMOVAL;  Surgeon: Algernon Huxley, MD;  Location: [place] CV LAB;  Service: Cardiovascular;  Laterality: N/A;  . INCISION AND DRAINAGE OF WOUND Right ~ 10/2014   "4th toe foot"  . PERCUTANEOUS CORONARY STENT INTERVENTION (PCI-S) N/A 12/14/2014   Procedure: PERCUTANEOUS CORONARY STENT INTERVENTION (PCI-S);  Surgeon: Charolette Forward, MD;  Location: [facility] CATH LAB;   Service: Cardiovascular;  Laterality: N/A;  . PERCUTANEOUS CORONARY STENT INTERVENTION (PCI-S) N/A 12/18/2014   Procedure: PERCUTANEOUS CORONARY STENT INTERVENTION (PCI-S);  Surgeon: Charolette Forward, MD;  Location: [facility] CATH LAB;  Service: Cardiovascular;  Laterality: N/A;  . POLYPECTOMY  12/01/2017   Procedure: POLYPECTOMY;  Surgeon: Lin Landsman, MD;  Location: [place];  Service: Endoscopy;;  . POLYPECTOMY  12/29/2017   Procedure: POLYPECTOMY;  Surgeon: Lin Landsman, MD;  Location: [place];  Service: Endoscopy;;  . TOE AMPUTATION Right 2015   4th toe    Prior to Admission medications   Medication Sig Start Date End Date Taking? Authorizing Provider  albuterol (PROVENTIL HFA;VENTOLIN HFA) 108 (90 BASE) MCG/ACT inhaler Inhale 2 puffs into the lungs every 6 (six) hours as needed for wheezing or shortness of breath. 01/10/15   Gladstone Lighter, MD  amLODipine (NORVASC) 10 MG tablet Take 10 mg by mouth daily.    [provider]  aspirin EC 81 MG tablet Take 1 tablet (81 mg total) by mouth daily. 01/10/15   Gladstone Lighter, MD  B Complex-C-Folic Acid (DIALYVITE 027) 0.8 MG TABS Take 1 tablet by mouth daily. 10/25/17   [provider]  calcitRIOL (ROCALTROL) 0.25 MCG capsule Take 0.25 mcg by mouth daily.    [provider]  calcium acetate (PHOSLO) 667 MG capsule Take 2,001 mg by mouth 3 (three) times daily with meals. Takes 1-2 caps if he has snacks 06/08/17   [provider]  carvedilol (COREG) 25 MG tablet Take 1 tablet (25 mg total) by mouth 2 (two) times daily with a meal. 08/30/17   Alisa Graff, FNP  Cholecalciferol (VITAMIN D) 2000 units tablet Take 2,000 Units by mouth daily. Unsure how many units    [provider]  furosemide (LASIX) 40 MG tablet Take 2 tablets (80 mg total) by mouth daily for 30 days. 09/19/18 10/19/18  Saundra Shelling, MD  gentamicin cream (GARAMYCIN) 0.1 % Apply 1 application topically daily.  03/02/18   [provider]  hydrALAZINE (APRESOLINE) 25 MG tablet Take 1 tablet (25 mg total) by mouth 3 (three) times daily. Patient taking differently: Take 25 mg by mouth 3 (three) times daily. Pt states he only takes once a day 03/12/18 06/10/18  Fritzi Mandes, MD  HYDROcodone-acetaminophen (NORCO/VICODIN) 5-325 MG tablet Take 1-2 tablets by mouth every 4 (four) hours as needed for moderate pain. 05/03/18   Hinda Kehr, MD  insulin aspart protamine- aspart (NOVOLOG MIX 70/30) (70-30) 100 UNIT/ML injection Inject 0.25 mLs (25 Units total) into the skin 2 (two) times daily with a meal. Patient taking differently: Inject 20-25 Units into the skin 2 (two) times daily with a meal.  03/12/18   Fritzi Mandes, MD  nitroGLYCERIN (NITROSTAT) 0.4 MG SL tablet Place 0.4 mg under the tongue every 5 (five) minutes as needed for chest pain. Pt needs new Rx. Bottle has expried    [provider]  omeprazole (PRILOSEC) 20 MG capsule Take 1 capsule (20 mg total) by mouth daily. 01/10/15   Gladstone Lighter, MD  ONE Margurite Auerbach  TEST test strip  11/25/17   [provider]  prasugrel (EFFIENT) 10 MG TABS tablet Take 10 mg by mouth daily.    [provider]    Allergies Patient has no known allergies.  Family History  Problem Relation Age of Onset  . Cancer Mother        Lung  . Cancer Father        Lung  . Diabetes Sister   . Diabetes Brother   . CAD Brother   . Hernia Son   . Cancer Brother     Social History Social History   Tobacco Use  . Smoking status: Former Smoker    Packs/day: 1.00    Years: 30.00    Pack years: 30.00    Types: Cigarettes    Start date: 08/31/1984    Quit date: 11/30/2014    Years since quitting: 4.4  . Smokeless tobacco: Never Used  Substance Use Topics  . Alcohol use: Yes    Comment: Rarely, twice a year.  . Drug use: No    Review of Systems  Review of Systems  Constitutional: Positive for fatigue. Negative for chills and fever.   HENT: Negative for sore throat.   Respiratory: Negative for shortness of breath.   Cardiovascular: Negative for chest pain.  Gastrointestinal: Negative for abdominal pain.  Genitourinary: Negative for flank pain.  Musculoskeletal: Positive for arthralgias and gait problem. Negative for neck pain.  Skin: Positive for rash and wound.  Allergic/Immunologic: Negative for immunocompromised state.  Neurological: Positive for weakness. Negative for numbness.  Hematological: Does not bruise/bleed easily.     ____________________________________________  PHYSICAL EXAM:      VITAL SIGNS: ED Triage Vitals [05/22/19 1509]  Enc Vitals Group     BP (!) 135/46     Pulse Rate 80     Resp 20     Temp 98.2 F (36.8 C)     Temp Source Oral     SpO2 100 %     Weight 200 lb (90.7 kg)     Height 5\' 10"  (1.778 m)     Head Circumference      Peak Flow      Pain Score 10     Pain Loc      Pain Edu?      Excl. in [place]?      Physical Exam Vitals signs and nursing note reviewed.  Constitutional:      General: He is not in acute distress.    Appearance: He is well-developed.  HENT:     Head: Normocephalic and atraumatic.  Eyes:     Conjunctiva/sclera: Conjunctivae normal.  Neck:     Musculoskeletal: Neck supple.  Cardiovascular:     Rate and Rhythm: Normal rate and regular rhythm.     Heart sounds: Normal heart sounds.  Pulmonary:     Effort: Pulmonary effort is normal. No respiratory distress.     Breath sounds: No wheezing.  Abdominal:     General: There is no distension.  Skin:    General: Skin is warm.     Capillary Refill: Capillary refill takes less than 2 seconds.     Findings: No rash.  Neurological:     Mental Status: He is alert and oriented to person, place, and time.     Motor: No abnormal muscle tone.      LOWER EXTREMITY EXAM: LEFT  INSPECTION & PALPATION: Dry gangrene noted to distal great toe, with ulceration along proximal line  of gangrene and erythema,  swelling extending to the proximal midfoot.  SENSORY: sensation is intact to light touch in:  Superficial peroneal nerve distribution (over dorsum of foot) Deep peroneal nerve distribution (over first dorsal web space) Sural nerve distribution (over lateral aspect 5th metatarsal) Saphenous nerve distribution (over medial instep)  MOTOR:  + Motor EHL (great toe dorsiflexion) + FHL (great toe plantar flexion)  + TA (ankle dorsiflexion)  + GSC (ankle plantar flexion)  VASCULAR: Dopplerable pulses b/l  COMPARTMENTS: Soft, warm, well-perfused No pain with passive extension No parethesias   ____________________________________________   LABS (all labs ordered are listed, but only abnormal results are displayed)  Labs Reviewed  COMPREHENSIVE METABOLIC PANEL - Abnormal; Notable for the following components:      Result Value   Potassium 3.4 (*)    CO2 21 (*)    Glucose, Bld 189 (*)    BUN 61 (*)    Creatinine, Ser 8.72 (*)    Calcium 7.4 (*)    Albumin 2.5 (*)    Alkaline Phosphatase 139 (*)    GFR calc non Af Amer 6 (*)    GFR calc Af Amer 7 (*)    Anion gap 17 (*)    All other components within normal limits  CBC WITH DIFFERENTIAL/PLATELET - Abnormal; Notable for the following components:   WBC 23.4 (*)    RBC 2.59 (*)    Hemoglobin 8.1 (*)    HCT 24.7 (*)    Neutro Abs 20.5 (*)    Lymphs Abs 0.6 (*)    Monocytes Absolute 1.5 (*)    Abs Immature Granulocytes 0.47 (*)    All other components within normal limits  SEDIMENTATION RATE - Abnormal; Notable for the following components:   Sed Rate 126 (*)    All other components within normal limits  C-REACTIVE PROTEIN - Abnormal; Notable for the following components:   CRP 25.9 (*)    All other components within normal limits  CREATININE, SERUM - Abnormal; Notable for the following components:   Creatinine, Ser 8.89 (*)    GFR calc non Af Amer 6 (*)    GFR calc Af Amer 7 (*)    All other components within normal  limits  SARS CORONAVIRUS 2 (HOSPITAL ORDER, [place] [facility])  CULTURE, BLOOD (ROUTINE X 2)  CULTURE, BLOOD (ROUTINE X 2)  LACTIC ACID, PLASMA  LACTIC ACID, PLASMA  URINALYSIS, COMPLETE (UACMP) WITH MICROSCOPIC  BASIC METABOLIC PANEL  CBC  HEMOGLOBIN A1C  HIV ANTIBODY (ROUTINE TESTING W REFLEX)    ____________________________________________  EKG: None ________________________________________  RADIOLOGY All imaging, including plain films, CT scans, and ultrasounds, independently reviewed by me, and interpretations confirmed via formal radiology reads.  ED MD interpretation:   XR Foot: Possible fracture distal phalanx of first toe, no obvious ostia  Official radiology report(s): Dg Foot Complete Left  Result Date: 05/22/2019 CLINICAL DATA:  Left great toe pain and redness EXAM: LEFT FOOT - COMPLETE 3+ VIEW COMPARISON:  None. FINDINGS: Obliquely oriented lucency involving the dorsal cortex of the great toe distal phalanx seen only on lateral view suggestive of a nondisplaced fracture. No definite corresponding abnormality on AP or oblique views. No definite cortical destruction or periostitis appreciated. Mild degenerative changes of the first MTP and IP joints of the great toe. Prominent plantar calcaneal spur. Severe vascular calcifications. Mild soft tissue irregularity along the medial soft tissues at the level of the great toe IP joint. IMPRESSION: 1. Findings suspicious  for nondisplaced fracture of the great toe distal phalanx. Consider further evaluation with dedicated radiographs of the digit as clinically indicated. 2. No specific radiographic evidence of acute osteomyelitis. Electronically Signed   By: Davina Poke M.D.   On: 05/22/2019 16:16    ____________________________________________  PROCEDURES   Procedure(s) performed (including Critical Care):  .Critical Care Performed by: Duffy Bruce, MD Authorized by: Duffy Bruce, MD    Critical care provider statement:    Critical care time (minutes):  35   Critical care time was exclusive of:  Separately billable procedures and treating other patients and teaching time   Critical care was necessary to treat or prevent imminent or life-threatening deterioration of the following conditions:  Cardiac failure, circulatory failure and sepsis   Critical care was time spent personally by me on the following activities:  Development of treatment plan with patient or surrogate, discussions with consultants, evaluation of patient's response to treatment, examination of patient, obtaining history from patient or surrogate, ordering and performing treatments and interventions, ordering and review of laboratory studies, ordering and review of radiographic studies, pulse oximetry, re-evaluation of patient's condition and review of old charts   I assumed direction of critical care for this patient from another provider in my specialty: no      ____________________________________________  INITIAL IMPRESSION / MDM / [place] / ED COURSE  As part of my medical decision making, I reviewed the following data within the electronic MEDICAL RECORD NUMBER Notes from prior ED visits and [place] Controlled Substance [place] was evaluated in Emergency Department on 05/22/2019 for the symptoms described in the history of present illness. He was evaluated in the context of the global COVID-19 pandemic, which necessitated consideration that the patient might be at risk for infection with the SARS-CoV-2 virus that causes COVID-19. Institutional protocols and algorithms that pertain to the evaluation of patients at risk for COVID-19 are in a state of rapid change based on information released by regulatory bodies including the CDC and federal and state organizations. These policies and algorithms were followed during the patient's care in the ED.  Some ED evaluations and  interventions may be delayed as a result of limited staffing during the pandemic.*   Clinical Course as of May 21 2029  Mon May 22, 2019  1628 58 yo M here with dry gangrene and now superimposed cellulitis of L great toe. Labs show significant leukocytosis, exam is c/f acute infection. Will send BCx, star IV broad-spectrum ABX. Unfortunately, pt has known severe vascular disease with poor prognosis for any salvage of the toe. Will d/w Podiatry, admit to medicine.   [CI]  2440 Prior surgery was with Dr. Elvina Mattes in 2016.   [CI]  1630 Left fem angioplasty in 03/2019 per records.   [CI]    Clinical Course User Index [CI] Duffy Bruce, MD    Medical Decision Making: As above.  Case discussed with [name] - will be seen in AM. Will make him NPO at [facility]. IV ABX sent, inflammatory markers reviewed and are elevated.  ____________________________________________  FINAL CLINICAL IMPRESSION(S) / ED DIAGNOSES  Final diagnoses:  Sepsis due to cellulitis ([place])  Gangrene of toe (H[facility])  PVD (peripheral vascular disease) ([place])  ESRD (end stage renal disease) ([place])     MEDICATIONS GIVEN DURING THIS VISIT:  Medications  piperacillin-tazobactam (ZOSYN) IVPB 3.375 g (3.375 g Intravenous Transfusing/Transfer 05/22/19 1810)  heparin injection 5,000 Units (5,000 Units Subcutaneous Given 05/22/19  2012)  acetaminophen (TYLENOL) tablet 650 mg (has no administration in time range)    Or  acetaminophen (TYLENOL) suppository 650 mg (has no administration in time range)  ondansetron (ZOFRAN) tablet 4 mg (has no administration in time range)    Or  ondansetron (ZOFRAN) injection 4 mg (has no administration in time range)  albuterol (PROVENTIL) (2.5 MG/3ML) 0.083% nebulizer solution 2.5 mg (has no administration in time range)  HYDROcodone-acetaminophen (NORCO/VICODIN) 5-325 MG per tablet 1-2 tablet (2 tablets Oral Given 05/22/19 2029)  senna-docusate (Senokot-S) tablet 1 tablet (has no administration in time  range)  bisacodyl (DULCOLAX) EC tablet 5 mg (has no administration in time range)  insulin aspart (novoLOG) injection 0-9 Units (has no administration in time range)  insulin aspart (novoLOG) injection 0-5 Units (has no administration in time range)  amLODipine (NORVASC) tablet 10 mg (has no administration in time range)  carvedilol (COREG) tablet 25 mg (has no administration in time range)  furosemide (LASIX) tablet 80 mg (has no administration in time range)  hydrALAZINE (APRESOLINE) tablet 25 mg (has no administration in time range)  nitroGLYCERIN (NITROSTAT) SL tablet 0.4 mg (has no administration in time range)  calcitRIOL (ROCALTROL) capsule 0.25 mcg (has no administration in time range)  insulin aspart protamine- aspart (NOVOLOG MIX 70/30) injection 25 Units (has no administration in time range)  calcium acetate (PHOSLO) capsule 2,001 mg (has no administration in time range)  pantoprazole (PROTONIX) EC tablet 40 mg (has no administration in time range)  cholecalciferol (VITAMIN D3) tablet 2,000 Units (has no administration in time range)  multivitamin (RENA-VIT) tablet 1 tablet (has no administration in time range)  Chlorhexidine Gluconate Cloth 2 % PADS 6 each (has no administration in time range)  HYDROmorphone (DILAUDID) injection 1 mg (1 mg Intravenous Given 05/22/19 1608)  vancomycin (VANCOCIN) 2,000 mg in sodium chloride 0.9 % 500 mL IVPB (2,000 mg Intravenous Transfusing/Transfer 05/22/19 1810)     ED Discharge Orders    None       Note:  This document was prepared using Dragon voice recognition software and may include unintentional dictation errors.   Duffy Bruce, MD 05/22/19 2030

## 2019-05-22 NOTE — ED Triage Notes (Signed)
Pt reports thinks he has gangrene in his left foot great toe. Pt with hx of the same in his right foot and says it is the same. Pt left great toe black and cold.

## 2019-05-22 NOTE — ED Notes (Signed)
ED TO INPATIENT HANDOFF REPORT  ED Nurse Name and Phone #: Khylan Sawyer [phone]  S Name/Age/Gender Scott [name] 58 y.o. male Room/Bed: ED05HA/ED05HA  Code Status   Code Status: Prior  Home/SNF/Other Home Patient oriented to: self, place, time and situation Is this baseline? Yes   Triage Complete: Triage complete  Chief Complaint Toe is black   Triage Note Pt reports thinks he has gangrene in his left foot great toe. Pt with hx of the same in his right foot and says it is the same. Pt left great toe black and cold.   Allergies No Known Allergies  Level of Care/Admitting Diagnosis ED Disposition    ED Disposition Condition [facility] Area: [place] [[id number]]  Level of Care: Med-Surg [16]  Covid Evaluation: Asymptomatic Screening Protocol (No Symptoms)  Diagnosis: Toe infection [956387]  Admitting Physician: Demetrios Loll [[id number]]  Attending Physician: Demetrios Loll [[id number]]  Estimated length of stay: past midnight tomorrow  Certification:: I certify this patient will need inpatient services for at least 2 midnights  PT Class (Do Not Modify): Inpatient [101]  PT Acc Code (Do Not Modify): Private [1]       B Medical/Surgery History Past Medical History:  Diagnosis Date  . Arthritis    "left arm; right leg" (12/13/2014)  . Asthma   . CHF (congestive heart failure) ([place])   . Chronic disease anemia    [name] 12/13/2014  . Chronic kidney disease (CKD), stage IV (severe) ([place])    [name] 12/13/2014... on dialysis  . Complication of anesthesia    unable to urinate after CAPD urgery  . Continuous ambulatory peritoneal dialysis status ([place])   . Coronary artery disease    [name] 12/13/2014  . Depression   . Dysrhythmia    patient unaware of irregular heartbeat  . GERD (gastroesophageal reflux disease)   . High cholesterol    [name] 12/13/2014  . Hypertension   . Non-Q wave myocardial infarction ([place])    [name] 12/13/2014  . PVD (peripheral  vascular disease) ([place])    [name] 12/13/2014  . Type II diabetes mellitus ([place])    Past Surgical History:  Procedure Laterality Date  . AV FISTULA PLACEMENT Right 04/07/2017   Procedure: ARTERIOVENOUS (AV) FISTULA CREATION ( BRACHIALCEPHALIC );  Surgeon: Katha Cabal, MD;  Location: [facility];  Service: Vascular;  Laterality: Right;  . CAPD INSERTION N/A 04/07/2017   Procedure: LAPAROSCOPIC INSERTION CONTINUOUS AMBULATORY PERITONEAL DIALYSIS  (CAPD) CATHETER;  Surgeon: Katha Cabal, MD;  Location: [facility];  Service: Vascular;  Laterality: N/A;  . CARDIAC CATHETERIZATION  11/2014  . COLONOSCOPY WITH PROPOFOL N/A 12/01/2017   Procedure: COLONOSCOPY WITH PROPOFOL;  Surgeon: Lin Landsman, MD;  Location: [facility];  Service: Endoscopy;  Laterality: N/A;  Diabetic - insulin  . COLONOSCOPY WITH PROPOFOL N/A 12/29/2017   Procedure: COLONOSCOPY WITH PROPOFOL;  Surgeon: Lin Landsman, MD;  Location: [facility];  Service: Endoscopy;  Laterality: N/A;  . DIALYSIS/PERMA CATHETER INSERTION N/A 01/18/2017   Procedure: Dialysis/Perma Catheter Insertion;  Surgeon: Algernon Huxley, MD;  Location: [facility];  Service: Cardiovascular;  Laterality: N/A;  . DIALYSIS/PERMA CATHETER REMOVAL N/A 07/26/2017   Procedure: DIALYSIS/PERMA CATHETER REMOVAL;  Surgeon: Algernon Huxley, MD;  Location: [place] [facility];  Service: Cardiovascular;  Laterality: N/A;  . INCISION AND DRAINAGE OF WOUND Right ~ 10/2014   "4th toe foot"  . PERCUTANEOUS CORONARY STENT INTERVENTION (PCI-S) N/A 12/14/2014  Procedure: PERCUTANEOUS CORONARY STENT INTERVENTION (PCI-S);  Surgeon: Charolette Forward, MD;  Location: [facility] CATH LAB;  Service: Cardiovascular;  Laterality: N/A;  . PERCUTANEOUS CORONARY STENT INTERVENTION (PCI-S) N/A 12/18/2014   Procedure: PERCUTANEOUS CORONARY STENT INTERVENTION (PCI-S);  Surgeon: Charolette Forward, MD;  Location: [facility] CATH LAB;  Service: Cardiovascular;  Laterality: N/A;  .  POLYPECTOMY  12/01/2017   Procedure: POLYPECTOMY;  Surgeon: Lin Landsman, MD;  Location: [place];  Service: Endoscopy;;  . POLYPECTOMY  12/29/2017   Procedure: POLYPECTOMY;  Surgeon: Lin Landsman, MD;  Location: [place];  Service: Endoscopy;;  . TOE AMPUTATION Right 2015   4th toe     A IV Location/Drains/Wounds Patient Lines/Drains/Airways Status   Active Line/Drains/Airways    Name:   Placement date:   Placement time:   Site:   Days:   Peripheral IV 05/22/19 Left Antecubital   05/22/19    1552    Antecubital   less than 1   Fistula / Graft Right Forearm Arteriovenous fistula   04/07/17    1421    Forearm   775   Hemodialysis Catheter Right Internal jugular Double-lumen;Permanent   01/18/17    -    Internal jugular   854          Intake/Output Last 24 hours No intake or output data in the 24 hours ending 05/22/19 1742  Labs/Imaging Results for orders placed or performed during the hospital encounter of 05/22/19 (from the past 48 hour(s))  Lactic acid, plasma     Status: None   Collection Time: 05/22/19  3:11 PM  Result Value Ref Range   Lactic Acid, Venous 1.1 0.5 - 1.9 mmol/L    Comment: Performed at [facility], [address] [place], [place] [address]  Comprehensive metabolic panel     Status: Abnormal   Collection Time: 05/22/19  3:11 PM  Result Value Ref Range   Sodium 137 135 - 145 mmol/L   Potassium 3.4 (L) 3.5 - 5.1 mmol/L   Chloride 99 98 - 111 mmol/L   CO2 21 (L) 22 - 32 mmol/L   Glucose, Bld 189 (H) 70 - 99 mg/dL   BUN 61 (H) 6 - 20 mg/dL   Creatinine, Ser 8.72 (H) 0.61 - 1.24 mg/dL   Calcium 7.4 (L) 8.9 - 10.3 mg/dL   Total Protein 6.8 6.5 - 8.1 g/dL   Albumin 2.5 (L) 3.5 - 5.0 g/dL   AST 30 15 - 41 U/L   ALT 34 0 - 44 U/L   Alkaline Phosphatase 139 (H) 38 - 126 U/L   Total Bilirubin 0.5 0.3 - 1.2 mg/dL   GFR calc non Af Amer 6 (L) >60 mL/min   GFR calc Af Amer 7 (L) >60 mL/min   Anion gap 17 (H) 5 - 15     Comment: Performed at [facility], [address] [place],  [address]  CBC with Differential     Status: Abnormal   Collection Time: 05/22/19  3:11 PM  Result Value Ref Range   WBC 23.4 (H) 4.0 - 10.5 K/uL   RBC 2.59 (L) 4.22 - 5.81 MIL/uL   Hemoglobin 8.1 (L) 13.0 - 17.0 g/dL   HCT 24.7 (L) 39.0 - 52.0 %   MCV 95.4 80.0 - 100.0 fL   MCH 31.3 26.0 - 34.0 pg   MCHC 32.8 30.0 - 36.0 g/dL   RDW 13.0 11.5 - 15.5 %   Platelets 398 150 - 400 K/uL  nRBC 0.0 0.0 - 0.2 %   Neutrophils Relative % 88 %   Neutro Abs 20.5 (H) 1.7 - 7.7 K/uL   Lymphocytes Relative 3 %   Lymphs Abs 0.6 (L) 0.7 - 4.0 K/uL   Monocytes Relative 6 %   Monocytes Absolute 1.5 (H) 0.1 - 1.0 K/uL   Eosinophils Relative 1 %   Eosinophils Absolute 0.3 0.0 - 0.5 K/uL   Basophils Relative 0 %   Basophils Absolute 0.1 0.0 - 0.1 K/uL   Immature Granulocytes 2 %   Abs Immature Granulocytes 0.47 (H) 0.00 - 0.07 K/uL    Comment: Performed at [facility], [address] [place], [place] [address]  Sedimentation rate     Status: Abnormal   Collection Time: 05/22/19  3:37 PM  Result Value Ref Range   Sed Rate 126 (H) 0 - 20 mm/hr    Comment: Performed at [facility], [address], [place], [place] [address]  SARS Coronavirus 2 [facility] order, Performed in [facility] hospital lab) Nasopharyngeal Nasopharyngeal Swab     Status: None   Collection Time: 05/22/19  3:53 PM   Specimen: Nasopharyngeal Swab  Result Value Ref Range   SARS Coronavirus 2 NEGATIVE NEGATIVE    Comment: (NOTE) If result is NEGATIVE SARS-CoV-2 target nucleic acids are NOT DETECTED. The SARS-CoV-2 RNA is generally detectable in upper and lower  respiratory specimens during the acute phase of infection. The lowest  concentration of SARS-CoV-2 viral copies this assay can detect is 250  copies / mL. A negative result does not preclude SARS-CoV-2 infection  and should not be used as the sole basis for treatment or  other  patient management decisions.  A negative result may occur with  improper specimen collection / handling, submission of specimen other  than nasopharyngeal swab, presence of viral mutation(s) within the  areas targeted by this assay, and inadequate number of viral copies  (<250 copies / mL). A negative result must be combined with clinical  observations, patient history, and epidemiological information. If result is POSITIVE SARS-CoV-2 target nucleic acids are DETECTED. The SARS-CoV-2 RNA is generally detectable in upper and lower  respiratory specimens dur ing the acute phase of infection.  Positive  results are indicative of active infection with SARS-CoV-2.  Clinical  correlation with patient history and other diagnostic information is  necessary to determine patient infection status.  Positive results do  not rule out bacterial infection or co-infection with other viruses. If result is PRESUMPTIVE POSTIVE SARS-CoV-2 nucleic acids MAY BE PRESENT.   A presumptive positive result was obtained on the submitted specimen  and confirmed on repeat testing.  While 2019 novel coronavirus  (SARS-CoV-2) nucleic acids may be present in the submitted sample  additional confirmatory testing may be necessary for epidemiological  and / or clinical management purposes  to differentiate between  SARS-CoV-2 and other Sarbecovirus currently known to infect humans.  If clinically indicated additional testing with an alternate test  methodology [phone]) is advised. The SARS-CoV-2 RNA is generally  detectable in upper and lower respiratory sp ecimens during the acute  phase of infection. The expected result is Negative. Fact Sheet for Patients:  StrictlyIdeas.no Fact Sheet for Healthcare Providers: BankingDealers.co.za This test is not yet approved or cleared by the [place] FDA and has been authorized for detection and/or diagnosis of  SARS-CoV-2 by FDA under an Emergency Use Authorization (EUA).  This EUA will remain in effect (meaning this test can be used) for  the duration of the COVID-19 declaration under Section 564(b)(1) of the Act, 21 U.S.C. section 360bbb-3(b)(1), unless the authorization is terminated or revoked sooner. Performed at [facility], [address] [place], [place] [address]    Dg Foot Complete Left  Result Date: 05/22/2019 CLINICAL DATA:  Left great toe pain and redness EXAM: LEFT FOOT - COMPLETE 3+ VIEW COMPARISON:  None. FINDINGS: Obliquely oriented lucency involving the dorsal cortex of the great toe distal phalanx seen only on lateral view suggestive of a nondisplaced fracture. No definite corresponding abnormality on AP or oblique views. No definite cortical destruction or periostitis appreciated. Mild degenerative changes of the first MTP and IP joints of the great toe. Prominent plantar calcaneal spur. Severe vascular calcifications. Mild soft tissue irregularity along the medial soft tissues at the level of the great toe IP joint. IMPRESSION: 1. Findings suspicious for nondisplaced fracture of the great toe distal phalanx. Consider further evaluation with dedicated radiographs of the digit as clinically indicated. 2. No specific radiographic evidence of acute osteomyelitis. Electronically Signed   By: Davina Poke M.D.   On: 05/22/2019 16:16    Pending Labs Unresulted Labs (From admission, onward)    Start     Ordered   05/22/19 1537  Blood culture (routine x 2)  BLOOD CULTURE X 2,   STAT     05/22/19 1536   05/22/19 1537  C-reactive protein  Once,   STAT     05/22/19 1536   05/22/19 1511  Lactic acid, plasma  Now then every 2 hours,   STAT     05/22/19 1510   05/22/19 1511  Urinalysis, Complete w Microscopic  ONCE - STAT,   STAT     05/22/19 1510   Signed and Held  Creatinine, serum  (heparin)  Once,   R    Comments: Baseline for heparin therapy IF NOT ALREADY DRAWN.     Signed and Held   Signed and Held  HIV antibody (Routine Testing)  Once,   R     Signed and Held   Signed and Occupational hygienist morning,   R     Signed and Held   Signed and Held  CBC  Tomorrow morning,   R     Signed and Held   Signed and Held  Hemoglobin A1c  Once,   R    Comments: To assess prior glycemic control    Signed and Held          Vitals/Pain Today's Vitals   05/22/19 1509  BP: (!) 135/46  Pulse: 80  Resp: 20  Temp: 98.2 F (36.8 C)  TempSrc: Oral  SpO2: 100%  Weight: 90.7 kg  Height: 5\' 10"  (1.778 m)  PainSc: 10-Worst pain ever    Isolation Precautions No active isolations  Medications Medications  vancomycin (VANCOCIN) 2,000 mg in sodium chloride 0.9 % 500 mL IVPB (has no administration in time range)  piperacillin-tazobactam (ZOSYN) IVPB 3.375 g (3.375 g Intravenous New Bag/Given 05/22/19 1728)  HYDROmorphone (DILAUDID) injection 1 mg (1 mg Intravenous Given 05/22/19 1608)    Mobility walks Moderate fall risk   Focused Assessments gangrenous L great toe with redness sperading    R Recommendations: See Admitting Provider Note  Report given to:   Additional Notes:

## 2019-05-22 NOTE — H&P (Addendum)
[facility] NAME: Scott [name]    MR#:  [id number]  DATE OF BIRTH:  09-Feb-1961  DATE OF ADMISSION:  05/22/2019  PRIMARY CARE PHYSICIAN: [facility] [place]   REQUESTING/REFERRING PHYSICIAN:   CHIEF COMPLAINT:   Chief Complaint  Patient presents with  . Blood Infection  . Toe Pain   Worsening left great toe pain, warmness and gangrene for 1 week. HISTORY OF PRESENT ILLNESS:  Scott [name]  is a 58 y.o. male with a known history of multiple problems as below.  The patient presents the ED with above chief complaint.  He started to have a small black spot on his distal left great toe about 3 to 4 months ago, which has been worsening for the past 1 week.  He denies any fever or chills.  ED physician discussed with on-call podiatrist who will see the patient tomorrow.  ED physician started vancomycin and Zosyn and request admission. PAST MEDICAL HISTORY:   Past Medical History:  Diagnosis Date  . Arthritis    "left arm; right leg" (12/13/2014)  . Asthma   . CHF (congestive heart failure) ([place])   . Chronic disease anemia    [name] 12/13/2014  . Chronic kidney disease (CKD), stage IV (severe) ([place])    [name] 12/13/2014... on dialysis  . Complication of anesthesia    unable to urinate after CAPD urgery  . Continuous ambulatory peritoneal dialysis status ([place])   . Coronary artery disease    [name] 12/13/2014  . Depression   . Dysrhythmia    patient unaware of irregular heartbeat  . GERD (gastroesophageal reflux disease)   . High cholesterol    [name] 12/13/2014  . Hypertension   . Non-Q wave myocardial infarction ([place])    [name] 12/13/2014  . PVD (peripheral vascular disease) ([place])    [name] 12/13/2014  . Type II diabetes mellitus ([place])     PAST SURGICAL HISTORY:   Past Surgical History:  Procedure Laterality Date  . AV FISTULA PLACEMENT Right 04/07/2017   Procedure: ARTERIOVENOUS (AV) FISTULA CREATION (  BRACHIALCEPHALIC );  Surgeon: Katha Cabal, MD;  Location: [facility];  Service: Vascular;  Laterality: Right;  . CAPD INSERTION N/A 04/07/2017   Procedure: LAPAROSCOPIC INSERTION CONTINUOUS AMBULATORY PERITONEAL DIALYSIS  (CAPD) CATHETER;  Surgeon: Katha Cabal, MD;  Location: [facility];  Service: Vascular;  Laterality: N/A;  . CARDIAC CATHETERIZATION  11/2014  . COLONOSCOPY WITH PROPOFOL N/A 12/01/2017   Procedure: COLONOSCOPY WITH PROPOFOL;  Surgeon: Lin Landsman, MD;  Location: [place];  Service: Endoscopy;  Laterality: N/A;  Diabetic - insulin  . COLONOSCOPY WITH PROPOFOL N/A 12/29/2017   Procedure: COLONOSCOPY WITH PROPOFOL;  Surgeon: Lin Landsman, MD;  Location: [place];  Service: Endoscopy;  Laterality: N/A;  . DIALYSIS/PERMA CATHETER INSERTION N/A 01/18/2017   Procedure: Dialysis/Perma Catheter Insertion;  Surgeon: Algernon Huxley, MD;  Location: [facility];  Service: Cardiovascular;  Laterality: N/A;  . DIALYSIS/PERMA CATHETER REMOVAL N/A 07/26/2017   Procedure: DIALYSIS/PERMA CATHETER REMOVAL;  Surgeon: Algernon Huxley, MD;  Location: [place] [facility];  Service: Cardiovascular;  Laterality: N/A;  . INCISION AND DRAINAGE OF WOUND Right ~ 10/2014   "4th toe foot"  . PERCUTANEOUS CORONARY STENT INTERVENTION (PCI-S) N/A 12/14/2014   Procedure: PERCUTANEOUS CORONARY STENT INTERVENTION (PCI-S);  Surgeon: Charolette Forward, MD;  Location: [facility];  Service: Cardiovascular;  Laterality: N/A;  . PERCUTANEOUS CORONARY STENT INTERVENTION (  PCI-S) N/A 12/18/2014   Procedure: PERCUTANEOUS CORONARY STENT INTERVENTION (PCI-S);  Surgeon: Charolette Forward, MD;  Location[facility] C[facility] LAB;  Service: Cardiovascular;  Laterality: N/A;  . POLYPECTOMY  12/01/2017   Procedure: POLYPECTOMY;  Surgeon: Lin Landsman, MD;  Location: [place];  Service: Endoscopy;;  . POLYPECTOMY  12/29/2017   Procedure: POLYPECTOMY;  Surgeon: Lin Landsman, MD;  Location:  [place];  Service: Endoscopy;;  . TOE AMPUTATION Right 2015   4th toe    SOCIAL HISTORY:   Social History   Tobacco Use  . Smoking status: Former Smoker    Packs/day: 1.00    Years: 30.00    Pack years: 30.00    Types: Cigarettes    Start date: 08/31/1984    Quit date: 11/30/2014    Years since quitting: 4.4  . Smokeless tobacco: Never Used  Substance Use Topics  . Alcohol use: Yes    Comment: Rarely, twice a year.    FAMILY HISTORY:   Family History  Problem Relation Age of Onset  . Cancer Mother        Lung  . Cancer Father        Lung  . Diabetes Sister   . Diabetes Brother   . CAD Brother   . Hernia Son   . Cancer Brother     DRUG ALLERGIES:  No Known Allergies  REVIEW OF SYSTEMS:   Review of Systems  Constitutional: Negative for chills, fever and malaise/fatigue.  HENT: Negative for sore throat.   Eyes: Negative for blurred vision and double vision.  Respiratory: Negative for cough, hemoptysis, shortness of breath, wheezing and stridor.   Cardiovascular: Negative for chest pain, palpitations, orthopnea and leg swelling.  Gastrointestinal: Negative for abdominal pain, blood in stool, diarrhea, melena, nausea and vomiting.  Genitourinary: Negative for dysuria, flank pain and hematuria.  Musculoskeletal: Negative for back pain and joint pain.       Left great toe pain, warm, and gangrene  Neurological: Negative for dizziness, sensory change, focal weakness, seizures, loss of consciousness, weakness and headaches.  Endo/Heme/Allergies: Negative for polydipsia.  Psychiatric/Behavioral: Negative for depression. The patient is not nervous/anxious.     MEDICATIONS AT HOME:   Prior to Admission medications   Medication Sig Start Date End Date Taking? Authorizing Provider  albuterol (PROVENTIL HFA;VENTOLIN HFA) 108 (90 BASE) MCG/ACT inhaler Inhale 2 puffs into the lungs every 6 (six) hours as needed for wheezing or shortness of breath. 01/10/15    Gladstone Lighter, MD  amLODipine (NORVASC) 10 MG tablet Take 10 mg by mouth daily.    [provider]  aspirin EC 81 MG tablet Take 1 tablet (81 mg total) by mouth daily. 01/10/15   Gladstone Lighter, MD  B Complex-C-Folic Acid (DIALYVITE 244) 0.8 MG TABS Take 1 tablet by mouth daily. 10/25/17   [provider]  calcitRIOL (ROCALTROL) 0.25 MCG capsule Take 0.25 mcg by mouth daily.    [provider]  calcium acetate (PHOSLO) 667 MG capsule Take 2,001 mg by mouth 3 (three) times daily with meals. Takes 1-2 caps if he has snacks 06/08/17   [provider]  carvedilol (COREG) 25 MG tablet Take 1 tablet (25 mg total) by mouth 2 (two) times daily with a meal. 08/30/17   Alisa Graff, FNP  Cholecalciferol (VITAMIN D) 2000 units tablet Take 2,000 Units by mouth daily. Unsure how many units    [provider]  furosemide (LASIX) 40 MG tablet Take 2 tablets (  80 mg total) by mouth daily for 30 days. 09/19/18 10/19/18  Saundra Shelling, MD  gentamicin cream (GARAMYCIN) 0.1 % Apply 1 application topically daily. 03/02/18   [provider]  hydrALAZINE (APRESOLINE) 25 MG tablet Take 1 tablet (25 mg total) by mouth 3 (three) times daily. Patient taking differently: Take 25 mg by mouth 3 (three) times daily. Pt states he only takes once a day 03/12/18 06/10/18  Fritzi Mandes, MD  HYDROcodone-acetaminophen (NORCO/VICODIN) 5-325 MG tablet Take 1-2 tablets by mouth every 4 (four) hours as needed for moderate pain. 05/03/18   Hinda Kehr, MD  insulin aspart protamine- aspart (NOVOLOG MIX 70/30) (70-30) 100 UNIT/ML injection Inject 0.25 mLs (25 Units total) into the skin 2 (two) times daily with a meal. Patient taking differently: Inject 20-25 Units into the skin 2 (two) times daily with a meal.  03/12/18   Fritzi Mandes, MD  nitroGLYCERIN (NITROSTAT) 0.4 MG SL tablet Place 0.4 mg under the tongue every 5 (five) minutes as needed for chest pain. Pt needs new Rx. Bottle has  expried    [provider]  omeprazole (PRILOSEC) 20 MG capsule Take 1 capsule (20 mg total) by mouth daily. 01/10/15   Gladstone Lighter, MD  ONE TOUCH ULTRA TEST test strip  11/25/17   [provider]  prasugrel (EFFIENT) 10 MG TABS tablet Take 10 mg by mouth daily.    [provider]      VITAL SIGNS:  Blood pressure (!) 135/46, pulse 80, temperature 98.2 F (36.8 C), temperature source Oral, resp. rate 20, height 5\' 10"  (1.778 m), weight 90.7 kg, SpO2 100 %.  PHYSICAL EXAMINATION:  Physical Exam  GENERAL:  58 y.o.-year-old patient lying in the bed with no acute distress.  EYES: Pupils equal, round, reactive to light and accommodation. No scleral icterus. Extraocular muscles intact.  HEENT: Head atraumatic, normocephalic.  NECK:  Supple, no jugular venous distention. No thyroid enlargement, no tenderness.  LUNGS: Normal breath sounds bilaterally, no wheezing, rales,rhonchi or crepitation. No use of accessory muscles of respiration.  CARDIOVASCULAR: S1, S2 normal. No murmurs, rubs, or gallops.  ABDOMEN: Soft, nontender, nondistended. Bowel sounds present. No organomegaly or mass.  EXTREMITIES: No cyanosis, or clubbing. Left great toe gangrene, left foot and ankle swelling. NEUROLOGIC: Cranial nerves II through XII are intact. Muscle strength 5/5 in all extremities. Sensation intact. Gait not checked.  PSYCHIATRIC: The patient is alert and oriented x 3.  SKIN: No obvious rash, lesion, or ulcer.   LABORATORY PANEL:   CBC Recent Labs  Lab 05/22/19 1511  WBC 23.4*  HGB 8.1*  HCT 24.7*  PLT 398   ------------------------------------------------------------------------------------------------------------------  Chemistries  Recent Labs  Lab 05/22/19 1511  NA 137  K 3.4*  CL 99  CO2 21*  GLUCOSE 189*  BUN 61*  CREATININE 8.72*  CALCIUM 7.4*  AST 30  ALT 34  ALKPHOS 139*  BILITOT 0.5    ------------------------------------------------------------------------------------------------------------------  Cardiac Enzymes No results for input(s): TROPONINI in the last 168 hours. ------------------------------------------------------------------------------------------------------------------  RADIOLOGY:  Dg Foot Complete Left  Result Date: 05/22/2019 CLINICAL DATA:  Left great toe pain and redness EXAM: LEFT FOOT - COMPLETE 3+ VIEW COMPARISON:  None. FINDINGS: Obliquely oriented lucency involving the dorsal cortex of the great toe distal phalanx seen only on lateral view suggestive of a nondisplaced fracture. No definite corresponding abnormality on AP or oblique views. No definite cortical destruction or periostitis appreciated. Mild degenerative changes of the first MTP and IP joints of the great toe.  Prominent plantar calcaneal spur. Severe vascular calcifications. Mild soft tissue irregularity along the medial soft tissues at the level of the great toe IP joint. IMPRESSION: 1. Findings suspicious for nondisplaced fracture of the great toe distal phalanx. Consider further evaluation with dedicated radiographs of the digit as clinically indicated. 2. No specific radiographic evidence of acute osteomyelitis. Electronically Signed   By: Davina Poke M.D.   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 The patient will be admitted to medical floor. Continue vancomycin and Zosyn.  Follow-up CBC and podiatrist. PVD.  Hold aspirin and Effient for possible procedure. Hypertension.  Continue home hypertension medication. Diabetes.  Start sliding scale, continue Lantus. ESRD on hemodialysis.  Nephrology consult for hemodialysis. All the records are reviewed and case discussed with ED provider. Management plans discussed with the patient, family and they are in agreement.  CODE STATUS: Full code.  TOTAL TIME TAKING CARE  OF THIS PATIENT: 52 minutes.    Demetrios Loll M.D on 05/22/2019 at 5:11 PM  Between 7am to 6pm - Pager - [phone]  After 6pm go to [email or web address] - [employer] brewer [facility]  Office  [phone]  CC: Primary care physician; [employer]   Note: This dictation was prepared with Diplomatic Services operational officer dictation along with smaller Company secretary. Any transcriptional errors that result from this process are unin

## 2019-05-22 NOTE — Progress Notes (Signed)
PHARMACY -  BRIEF ANTIBIOTIC NOTE   Pharmacy has received consult(s) for Vancomycin and Zosyn from an ED provider.  The patient's profile has been reviewed for ht/wt/allergies/indication/available labs.    One time order(s) placed for Vancomycin 2g IV and Zosyn 3.375g IV.  Further antibiotics/pharmacy consults should be ordered by admitting physician if indicated.                       Thank you, Pearla Dubonnet 05/22/2019  4:36 PM

## 2019-05-23 ENCOUNTER — Other Ambulatory Visit (INDEPENDENT_AMBULATORY_CARE_PROVIDER_SITE_OTHER): Payer: Self-pay | Admitting: Vascular Surgery

## 2019-05-23 ENCOUNTER — Encounter
Admission: EM | Disposition: A | Discharge status: Home Health | Payer: Self-pay | Source: Home / Self Care | Attending: Internal Medicine

## 2019-05-23 DIAGNOSIS — I70262 Atherosclerosis of native arteries of extremities with gangrene, left leg: Secondary | ICD-10-CM | POA: Diagnosis not present

## 2019-05-23 HISTORY — PX: LOWER EXTREMITY ANGIOGRAPHY: CATH118251

## 2019-05-23 LAB — GLUCOSE, CAPILLARY
Glucose-Capillary: 111 mg/dL — ABNORMAL HIGH (ref 70–99)
Glucose-Capillary: 129 mg/dL — ABNORMAL HIGH (ref 70–99)
Glucose-Capillary: 149 mg/dL — ABNORMAL HIGH (ref 70–99)
Glucose-Capillary: 144 mg/dL — ABNORMAL HIGH (ref 70–99)
Glucose-Capillary: 79 mg/dL (ref 70–99)

## 2019-05-23 LAB — CBC
HCT: 22 % — ABNORMAL LOW (ref 39.0–52.0)
Hemoglobin: 7.2 g/dL — ABNORMAL LOW (ref 13.0–17.0)
MCH: 31 pg (ref 26.0–34.0)
MCHC: 32.7 g/dL (ref 30.0–36.0)
MCV: 94.8 fL (ref 80.0–100.0)
Platelets: 336 10*3/uL (ref 150–400)
RBC: 2.32 MIL/uL — ABNORMAL LOW (ref 4.22–5.81)
RDW: 12.8 % (ref 11.5–15.5)
WBC: 15.9 10*3/uL — ABNORMAL HIGH (ref 4.0–10.5)
nRBC: 0 % (ref 0.0–0.2)

## 2019-05-23 LAB — BASIC METABOLIC PANEL
Anion gap: 14 (ref 5–15)
BUN: 65 mg/dL — ABNORMAL HIGH (ref 6–20)
CO2: 20 mmol/L — ABNORMAL LOW (ref 22–32)
Calcium: 7.2 mg/dL — ABNORMAL LOW (ref 8.9–10.3)
Chloride: 103 mmol/L (ref 98–111)
Creatinine, Ser: 9.05 mg/dL — ABNORMAL HIGH (ref 0.61–1.24)
GFR calc Af Amer: 7 mL/min — ABNORMAL LOW (ref >60)
GFR calc non Af Amer: 6 mL/min — ABNORMAL LOW (ref >60)
Glucose, Bld: 114 mg/dL — ABNORMAL HIGH (ref 70–99)
Potassium: 3.5 mmol/L (ref 3.5–5.1)
Sodium: 137 mmol/L (ref 135–145)

## 2019-05-23 LAB — SURGICAL PCR SCREEN
MRSA, PCR: NEGATIVE
Staphylococcus aureus: NEGATIVE

## 2019-05-23 SURGERY — LOWER EXTREMITY ANGIOGRAPHY
Anesthesia: Moderate Sedation | Laterality: Left

## 2019-05-23 MED ORDER — SODIUM CHLORIDE 0.9 % IV SOLN
INTRAVENOUS | Status: AC
  Administered 2019-05-23: 16:00:00 via INTRAVENOUS

## 2019-05-23 MED ORDER — HEPARIN SODIUM (PORCINE) 1000 UNIT/ML IJ SOLN
INTRAMUSCULAR | Status: DC | PRN
  Administered 2019-05-23: 2000 [IU] via INTRAVENOUS
  Administered 2019-05-23: 5000 [IU] via INTRAVENOUS

## 2019-05-23 MED ORDER — DELFLEX-LC/2.5% DEXTROSE 394 MOSM/L IP SOLN
INTRAPERITONEAL | Status: DC
  Administered 2019-05-23 – 2019-05-24 (×2): via INTRAPERITONEAL
  Filled 2019-05-23 (×4): qty 3000

## 2019-05-23 MED ORDER — PIPERACILLIN-TAZOBACTAM 3.375 G IVPB
3.3750 g | Freq: Two times a day (BID) | INTRAVENOUS | Status: DC
  Administered 2019-05-23 – 2019-05-25 (×5): 3.375 g via INTRAVENOUS
  Filled 2019-05-23 (×5): qty 50

## 2019-05-23 MED ORDER — POVIDONE-IODINE 10 % EX SWAB: 2.0000 "application " | Freq: Once | CUTANEOUS | Status: DC

## 2019-05-23 MED ORDER — ONDANSETRON HCL 4 MG/2ML IJ SOLN: 4.0000 mg | Freq: Four times a day (QID) | INTRAMUSCULAR | Status: DC | PRN

## 2019-05-23 MED ORDER — SODIUM CHLORIDE 0.9% FLUSH
3.0000 mL | Freq: Two times a day (BID) | INTRAVENOUS | Status: DC
  Administered 2019-05-23 – 2019-05-26 (×6): 3 mL via INTRAVENOUS

## 2019-05-23 MED ORDER — CHLORHEXIDINE GLUCONATE 4 % EX LIQD
60.0000 mL | Freq: Once | CUTANEOUS | Status: AC
  Administered 2019-05-24: 4 via TOPICAL

## 2019-05-23 MED ORDER — VANCOMYCIN VARIABLE DOSE PER UNSTABLE RENAL FUNCTION (PHARMACIST DOSING): Status: DC

## 2019-05-23 MED ORDER — OXYCODONE HCL 5 MG PO TABS
5.0000 mg | ORAL_TABLET | ORAL | Status: DC | PRN
  Administered 2019-05-25: 5 mg via ORAL
  Filled 2019-05-23: qty 1

## 2019-05-23 MED ORDER — MORPHINE SULFATE (PF) 4 MG/ML IV SOLN: 2.0000 mg | INTRAVENOUS | Status: DC | PRN

## 2019-05-23 MED ORDER — HEPARIN SODIUM (PORCINE) 1000 UNIT/ML IJ SOLN
INTRAMUSCULAR | Status: AC
  Filled 2019-05-23: qty 1

## 2019-05-23 MED ORDER — FAMOTIDINE 20 MG PO TABS: 40.0000 mg | ORAL_TABLET | Freq: Once | ORAL | Status: DC | PRN

## 2019-05-23 MED ORDER — GENTAMICIN SULFATE 0.1 % EX CREA
1.0000 "application " | TOPICAL_CREAM | Freq: Every day | CUTANEOUS | Status: DC
  Administered 2019-05-23 – 2019-05-26 (×3): 1 via TOPICAL
  Filled 2019-05-23: qty 15

## 2019-05-23 MED ORDER — ATORVASTATIN CALCIUM 10 MG PO TABS
10.0000 mg | ORAL_TABLET | Freq: Every day | ORAL | Status: DC
  Administered 2019-05-23 – 2019-05-25 (×3): 10 mg via ORAL
  Filled 2019-05-23 (×3): qty 1

## 2019-05-23 MED ORDER — CLOPIDOGREL BISULFATE 75 MG PO TABS
150.0000 mg | ORAL_TABLET | ORAL | Status: AC
  Administered 2019-05-23: 14:00:00 150 mg via ORAL

## 2019-05-23 MED ORDER — SODIUM CHLORIDE 0.9 % IV SOLN
INTRAVENOUS | Status: DC
  Administered 2019-05-23: 10:00:00 via INTRAVENOUS

## 2019-05-23 MED ORDER — ENSURE PRE-SURGERY PO LIQD
296.0000 mL | Freq: Once | ORAL | Status: DC
  Filled 2019-05-23: qty 296

## 2019-05-23 MED ORDER — ACETAMINOPHEN 325 MG PO TABS: 650.0000 mg | ORAL_TABLET | ORAL | Status: DC | PRN

## 2019-05-23 MED ORDER — DIPHENHYDRAMINE HCL 50 MG/ML IJ SOLN: 50.0000 mg | Freq: Once | INTRAMUSCULAR | Status: DC | PRN

## 2019-05-23 MED ORDER — MIDAZOLAM HCL 2 MG/2ML IJ SOLN
INTRAMUSCULAR | Status: DC | PRN
  Administered 2019-05-23: 2 mg via INTRAVENOUS
  Administered 2019-05-23 (×2): 0.5 mg via INTRAVENOUS

## 2019-05-23 MED ORDER — CLOPIDOGREL BISULFATE 75 MG PO TABS
ORAL_TABLET | ORAL | Status: AC
  Administered 2019-05-23: 14:00:00 150 mg via ORAL
  Filled 2019-05-23: qty 2

## 2019-05-23 MED ORDER — SODIUM CHLORIDE 0.9 % IV SOLN
250.0000 mL | INTRAVENOUS | Status: DC | PRN
  Administered 2019-05-24: 30 mL via INTRAVENOUS

## 2019-05-23 MED ORDER — CLOPIDOGREL BISULFATE 75 MG PO TABS
75.0000 mg | ORAL_TABLET | Freq: Every day | ORAL | Status: DC
  Administered 2019-05-24 – 2019-05-26 (×3): 75 mg via ORAL
  Filled 2019-05-23 (×4): qty 1

## 2019-05-23 MED ORDER — SODIUM CHLORIDE 0.9% FLUSH
3.0000 mL | INTRAVENOUS | Status: DC | PRN
  Administered 2019-05-25: 09:00:00 3 mL via INTRAVENOUS
  Filled 2019-05-23: qty 3

## 2019-05-23 MED ORDER — MIDAZOLAM HCL 5 MG/5ML IJ SOLN
INTRAMUSCULAR | Status: AC
  Filled 2019-05-23: qty 5

## 2019-05-23 MED ORDER — MIDAZOLAM HCL 2 MG/ML PO SYRP: 8.0000 mg | ORAL_SOLUTION | Freq: Once | ORAL | Status: DC | PRN

## 2019-05-23 MED ORDER — METHYLPREDNISOLONE SODIUM SUCC 125 MG IJ SOLR: 125.0000 mg | Freq: Once | INTRAMUSCULAR | Status: DC | PRN

## 2019-05-23 MED ORDER — CEFAZOLIN SODIUM-DEXTROSE 1-4 GM/50ML-% IV SOLN
1.0000 g | Freq: Once | INTRAVENOUS | Status: AC
  Administered 2019-05-23: 10:00:00 1 g via INTRAVENOUS

## 2019-05-23 MED ORDER — FENTANYL CITRATE (PF) 100 MCG/2ML IJ SOLN
INTRAMUSCULAR | Status: AC
  Filled 2019-05-23: qty 2

## 2019-05-23 MED ORDER — HYDROMORPHONE HCL 1 MG/ML IJ SOLN
1.0000 mg | Freq: Once | INTRAMUSCULAR | Status: AC | PRN
  Administered 2019-05-24: 1 mg via INTRAVENOUS
  Filled 2019-05-23: qty 1

## 2019-05-23 MED ORDER — FENTANYL CITRATE (PF) 100 MCG/2ML IJ SOLN
INTRAMUSCULAR | Status: DC | PRN
  Administered 2019-05-23: 25 ug via INTRAVENOUS
  Administered 2019-05-23: 50 ug via INTRAVENOUS
  Administered 2019-05-23: 25 ug via INTRAVENOUS

## 2019-05-23 SURGICAL SUPPLY — 34 items
BALLN STERLING OTW 2X40X150 (BALLOONS) ×3
BALLN ULTRVRSE 2.5X150X150 (BALLOONS) ×3
BALLN ULTRVRSE 2X40X150 (BALLOONS) ×3
BALLN UTLRAVERSE 2X60X150 (BALLOONS) ×3
BALLOON STERLING OTW 2X40X150 (BALLOONS) ×1 IMPLANT
BALLOON ULTRVRSE 2.5X150X150 (BALLOONS) ×1 IMPLANT
BALLOON ULTRVRSE 2X40X150 (BALLOONS) ×1 IMPLANT
BALLOON UTLRAVERSE 2X60X150 (BALLOONS) ×1 IMPLANT
CATH BEACON 5 .035 65 RIM TIP (CATHETERS) ×3 IMPLANT
CATH CROSSER S6 154CM (CATHETERS) ×3 IMPLANT
CATH CXI SUPP 2.6F 150 ST (CATHETERS) ×3 IMPLANT
CATH PIG 70CM (CATHETERS) ×3 IMPLANT
CATH SEEKER .018X150 (CATHETERS) ×3 IMPLANT
CATH USHER TPER 130CM (CATHETERS) ×3 IMPLANT
CATH VERT 5FR 125CM (CATHETERS) ×3 IMPLANT
COVER PROBE U/S 5X48 (MISCELLANEOUS) ×3 IMPLANT
DEVICE PRESTO INFLATION (MISCELLANEOUS) ×3 IMPLANT
DEVICE STARCLOSE SE CLOSURE (Vascular Products) ×3 IMPLANT
DEVICE TORQUE .025-.038 (MISCELLANEOUS) ×3 IMPLANT
GLIDEWIRE ADV .035X260CM (WIRE) ×3 IMPLANT
GUIDEWIRE PFTE-COATED .018X300 (WIRE) ×6 IMPLANT
GUIDEWIRE SUPER STIFF .035X180 (WIRE) ×3 IMPLANT
KIT FLOWMATE PROCEDURAL (KITS) ×3 IMPLANT
NEEDLE ENTRY 21GA 7CM ECHOTIP (NEEDLE) ×3 IMPLANT
PACK ANGIOGRAPHY (CUSTOM PROCEDURE TRAY) ×3 IMPLANT
SET INTRO CAPELLA COAXIAL (SET/KITS/TRAYS/PACK) ×3 IMPLANT
SHEATH BRITE TIP 5FRX11 (SHEATH) ×3 IMPLANT
SHEATH PINNACLE MP 7F 45CM (SHEATH) ×3 IMPLANT
SHEATH RAABE 6FR (SHEATH) ×3 IMPLANT
SYR MEDRAD MARK 7 150ML (SYRINGE) ×3 IMPLANT
TUBING CONTRAST HIGH PRESS 72 (TUBING) ×3 IMPLANT
WIRE G V18X300CM (WIRE) ×3 IMPLANT
WIRE J 3MM .035X145CM (WIRE) ×3 IMPLANT
WIRE RUNTHROUGH .014X300CM (WIRE) ×3 IMPLANT

## 2019-05-23 NOTE — Progress Notes (Signed)
[place] at [facility]                                                                                                                                                                                  Patient Demographics   Scott [name], is a 58 y.o. male, DOB - 16-Sep-1960, FXT:[id number]  Admit date - 05/22/2019   Admitting Physician Demetrios Loll, MD  Outpatient Primary MD for the patient is [facility] [place]   LOS - 1  Subjective: Pt had arteriogram ealier today denies any cp or sob    Review of Systems:   CONSTITUTIONAL: No documented fever. No fatigue, weakness. No weight gain, no weight loss.  EYES: No blurry or double vision.  ENT: No tinnitus. No postnasal drip. No redness of the oropharynx.  RESPIRATORY: No cough, no wheeze, no hemoptysis. No dyspnea.  CARDIOVASCULAR: No chest pain. No orthopnea. No palpitations. No syncope.  GASTROINTESTINAL: No nausea, no vomiting or diarrhea. No abdominal pain. No melena or hematochezia.  GENITOURINARY: No dysuria or hematuria.  ENDOCRINE: No polyuria or nocturia. No heat or cold intolerance.  HEMATOLOGY: No anemia. No bruising. No bleeding.  INTEGUMENTARY: left great toe with necrosis MUSCULOSKELETAL: No arthritis. No swelling. No gout.  NEUROLOGIC: No numbness, tingling, or ataxia. No seizure-type activity.  PSYCHIATRIC: No anxiety. No insomnia. No ADD.    Vitals:   Vitals:   05/23/19 1320 05/23/19 1330 05/23/19 1345 05/23/19 1416  BP: (!) 146/64 (!) 157/57 (!) 157/78 (!) 150/70  Pulse: 77 77 75 75  Resp: 16 17  19   Temp:    (!) 97.3 F (36.3 C)  TempSrc:    Oral  SpO2: 95% 99% 99% 94%  Weight:      Height:        Wt Readings from Last 3 Encounters:  05/23/19 90.7 kg  09/19/18 101.8 kg  05/03/18 104.3 kg     Intake/Output Summary (Last 24 hours) at 05/23/2019 1519 Last data filed at 05/23/2019 0549 Gross per 24 hour  Intake 771.79 ml  Output 300 ml  Net 471.79 ml    Physical  Exam:   GENERAL: Pleasant-appearing in no apparent distress.  HEAD, EYES, EARS, NOSE AND THROAT: Atraumatic, normocephalic. Extraocular muscles are intact. Pupils equal and reactive to light. Sclerae anicteric. No conjunctival injection. No oro-pharyngeal erythema.  NECK: Supple. There is no jugular venous distention. No bruits, no lymphadenopathy, no thyromegaly.  HEART: Regular rate and rhythm,. No murmurs, no rubs, no clicks.  LUNGS: Clear to auscultation bilaterally. No rales or rhonchi. No wheezes.  ABDOMEN: Soft, flat, nontender, nondistended. Has good bowel sounds. No hepatosplenomegaly appreciated.  EXTREMITIES: No evidence of  any cyanosis, clubbing, or peripheral edema.  +2 pedal and radial pulses bilaterally.  NEUROLOGIC: The patient is alert, awake, and oriented x3 with no focal motor or sensory deficits appreciated bilaterally.  SKIN: left great toe necrosis Psych: Not anxious, depressed LN: No inguinal LN enlargement    Antibiotics   Anti-infectives (From admission, onward)   Start     Dose/Rate Route Frequency Ordered Stop   05/24/19 0600  ceFAZolin (ANCEF) IVPB 1 g/50 mL premix    Note to Pharmacy: To be given in specials   1 g 100 mL/hr over 30 Minutes Intravenous  Once 05/23/19 0949 05/23/19 1056   05/23/19 1430  piperacillin-tazobactam (ZOSYN) IVPB 3.375 g     3.375 g 12.5 mL/hr over 240 Minutes Intravenous Every 12 hours 05/23/19 1417     05/23/19 1417  vancomycin variable dose per unstable renal function (pharmacist dosing)      Does not apply See admin instructions 05/23/19 1417     05/22/19 1700  vancomycin (VANCOCIN) 2,000 mg in sodium chloride 0.9 % 500 mL IVPB     2,000 mg 250 mL/hr over 120 Minutes Intravenous  Once 05/22/19 1635 05/22/19 2300   05/22/19 1700  piperacillin-tazobactam (ZOSYN) IVPB 3.375 g     3.375 g 12.5 mL/hr over 240 Minutes Intravenous  Once 05/22/19 1635 05/22/19 2300      Medications   Scheduled Meds: . amLODipine  10 mg Oral  Daily  . atorvastatin  10 mg Oral q1800  . calcitRIOL  0.25 mcg Oral Daily  . calcium acetate  2,001 mg Oral TID WC  . carvedilol  25 mg Oral BID WC  . chlorhexidine  60 mL Topical Once  . Chlorhexidine Gluconate Cloth  6 each Topical Daily  . cholecalciferol  2,000 Units Oral Daily  . [START ON 05/24/2019] clopidogrel  75 mg Oral Q breakfast  . feeding supplement  296 mL Oral Once  . fentaNYL      . furosemide  80 mg Oral Daily  . heparin      . heparin  5,000 Units Subcutaneous Q8H  . hydrALAZINE  25 mg Oral Daily  . insulin aspart  0-5 Units Subcutaneous QHS  . insulin aspart  0-9 Units Subcutaneous TID WC  . insulin aspart protamine- aspart  25 Units Subcutaneous BID WC  . midazolam      . multivitamin  1 tablet Oral Daily  . pantoprazole  40 mg Oral Daily  . povidone-iodine  2 application Topical Once  . sodium chloride flush  3 mL Intravenous Q12H  . vancomycin variable dose per unstable renal function (pharmacist dosing)   Does not apply See admin instructions   Continuous Infusions: . sodium chloride    . sodium chloride    . piperacillin-tazobactam (ZOSYN)  IV     PRN Meds:.sodium chloride, [DISCONTINUED] acetaminophen **OR** acetaminophen, acetaminophen, albuterol, bisacodyl, HYDROcodone-acetaminophen, HYDROmorphone (DILAUDID) injection, morphine injection, nitroGLYCERIN, ondansetron (ZOFRAN) IV, ondansetron **OR** [DISCONTINUED] ondansetron (ZOFRAN) IV, oxyCODONE, senna-docusate, sodium chloride flush   Data Review:   Micro Results Recent Results (from the past 240 hour(s))  Blood culture (routine x 2)     Status: None (Preliminary result)   Collection Time: 05/22/19  3:37 PM   Specimen: BLOOD  Result Value Ref Range Status   Specimen Description BLOOD LEFT ANTECUBITAL  Final   Special Requests Blood Culture adequate volume  Final   Culture   Final    NO GROWTH < 24 HOURS Performed at [facility], [address] [place], [place] [address]    Report  Status PENDING  Incomplete  Blood culture (routine x 2)     Status: None (Preliminary result)   Collection Time: 05/22/19  3:42 PM   Specimen: BLOOD  Result Value Ref Range Status   Specimen Description BLOOD BLOOD LEFT FOREARM  Final   Special Requests Blood Culture adequate volume  Final   Culture   Final    NO GROWTH < 24 HOURS Performed at [facility], [address], [place], [place] [address]    Report Status PENDING  Incomplete  SARS Coronavirus 2 [facility] order, Performed in [facility] hospital lab) Nasopharyngeal Nasopharyngeal Swab     Status: None   Collection Time: 05/22/19  3:53 PM   Specimen: Nasopharyngeal Swab  Result Value Ref Range Status   SARS Coronavirus 2 NEGATIVE NEGATIVE Final    Comment: (NOTE) If result is NEGATIVE SARS-CoV-2 target nucleic acids are NOT DETECTED. The SARS-CoV-2 RNA is generally detectable in upper and lower  respiratory specimens during the acute phase of infection. The lowest  concentration of SARS-CoV-2 viral copies this assay can detect is 250  copies / mL. A negative result does not preclude SARS-CoV-2 infection  and should not be used as the sole basis for treatment or other  patient management decisions.  A negative result may occur with  improper specimen collection / handling, submission of specimen other  than nasopharyngeal swab, presence of viral mutation(s) within the  areas targeted by this assay, and inadequate number of viral copies  (<250 copies / mL). A negative result must be combined with clinical  observations, patient history, and epidemiological information. If result is POSITIVE SARS-CoV-2 target nucleic acids are DETECTED. The SARS-CoV-2 RNA is generally detectable in upper and lower  respiratory specimens dur ing the acute phase of infection.  Positive  results are indicative of active infection with SARS-CoV-2.  Clinical  correlation with patient history and other diagnostic information is   necessary to determine patient infection status.  Positive results do  not rule out bacterial infection or co-infection with other viruses. If result is PRESUMPTIVE POSTIVE SARS-CoV-2 nucleic acids MAY BE PRESENT.   A presumptive positive result was obtained on the submitted specimen  and confirmed on repeat testing.  While 2019 novel coronavirus  (SARS-CoV-2) nucleic acids may be present in the submitted sample  additional confirmatory testing may be necessary for epidemiological  and / or clinical management purposes  to differentiate between  SARS-CoV-2 and other Sarbecovirus currently known to infect humans.  If clinically indicated additional testing with an alternate test  methodology [phone]) is advised. The SARS-CoV-2 RNA is generally  detectable in upper and lower respiratory sp ecimens during the acute  phase of infection. The expected result is Negative. Fact Sheet for Patients:  StrictlyIdeas.no Fact Sheet for Healthcare Providers: BankingDealers.co.za This test is not yet approved or cleared by the [place] FDA and has been authorized for detection and/or diagnosis of SARS-CoV-2 by FDA under an Emergency Use Authorization (EUA).  This EUA will remain in effect (meaning this test can be used) for the duration of the COVID-19 declaration under Section 564(b)(1) of the Act, 21 U.S.C. section 360bbb-3(b)(1), unless the authorization is terminated or revoked sooner. Performed at [facility], [address] [place], [place] [address]     Radiology Reports Dg Foot Complete Left  Result Date: 05/22/2019 CLINICAL DATA:  Left great toe pain and redness EXAM: LEFT FOOT - COMPLETE  3+ VIEW COMPARISON:  None. FINDINGS: Obliquely oriented lucency involving the dorsal cortex of the great toe distal phalanx seen only on lateral view suggestive of a nondisplaced fracture. No definite corresponding abnormality on AP or  oblique views. No definite cortical destruction or periostitis appreciated. Mild degenerative changes of the first MTP and IP joints of the great toe. Prominent plantar calcaneal spur. Severe vascular calcifications. Mild soft tissue irregularity along the medial soft tissues at the level of the great toe IP joint. IMPRESSION: 1. Findings suspicious for nondisplaced fracture of the great toe distal phalanx. Consider further evaluation with dedicated radiographs of the digit as clinically indicated. 2. No specific radiographic evidence of acute osteomyelitis. Electronically Signed   By: Davina Poke M.D.   On: 05/22/2019 16:16     CBC Recent Labs  Lab 05/22/19 1511 05/23/19 0524  WBC 23.4* 15.9*  HGB 8.1* 7.2*  HCT 24.7* 22.0*  PLT 398 336  MCV 95.4 94.8  MCH 31.3 31.0  MCHC 32.8 32.7  RDW 13.0 12.8  LYMPHSABS 0.6*  --   MONOABS 1.5*  --   EOSABS 0.3  --   BASOSABS 0.1  --     Chemistries  Recent Labs  Lab 05/22/19 1511 05/22/19 1831 05/23/19 0524  NA 137  --  137  K 3.4*  --  3.5  CL 99  --  103  CO2 21*  --  20*  GLUCOSE 189*  --  114*  BUN 61*  --  65*  CREATININE 8.72* 8.89* 9.05*  CALCIUM 7.4*  --  7.2*  AST 30  --   --   ALT 34  --   --   ALKPHOS 139*  --   --   BILITOT 0.5  --   --    ------------------------------------------------------------------------------------------------------------------ estimated creatinine clearance is 9.8 mL/min (A) (by C-G formula based on SCr of 9.05 mg/dL (H)). ------------------------------------------------------------------------------------------------------------------ Recent Labs    05/22/19 1831  HGBA1C 6.1*   ------------------------------------------------------------------------------------------------------------------ No results for input(s): CHOL, HDL, LDLCALC, TRIG, CHOLHDL, LDLDIRECT in the last 72  hours. ------------------------------------------------------------------------------------------------------------------ No results for input(s): TSH, T4TOTAL, T3FREE, THYROIDAB in the last 72 hours.  Invalid input(s): FREET3 ------------------------------------------------------------------------------------------------------------------ No results for input(s): VITAMINB12, FOLATE, FERRITIN, TIBC, IRON, RETICCTPCT in the last 72 hours.  Coagulation profile No results for input(s): INR, PROTIME in the last 168 hours.  No results for input(s): DDIMER in the last 72 hours.  Cardiac Enzymes No results for input(s): CKMB, TROPONINI, MYOGLOBIN in the last 168 hours.  Invalid input(s): CK ------------------------------------------------------------------------------------------------------------------ Invalid input(s): [place]   Patient is a 58 year old with peripheral vascular disease presents with left great toe gangrene  1. Left great toe gangrene infection with leukocytosis. Continue IV vancomycin and Zosyn. Per podiatry further treatment after vascular intervention which was already done   2. PVD.    Resume aspirin and Effient  3. Hypertension.  Continue home hypertension medication. bp stable  4. Diabetes.  continue sliding scale, continue Lantus.  5. ESRD on on pd will make sure PD arranged for today.     Code Status Orders  (From admission, onward)         Start     Ordered   05/22/19 1831  Full code  Continuous     05/22/19 1830        Code Status History    Date Active Date Inactive Code Status Order ID Comments User Context   09/18/2018 0022 09/19/2018 1608 Full Code  [id number]  Mayo, Pete Pelt, MD ED   03/09/2018 1206 03/12/2018 1712 Full Code [id number]  Henreitta Leber, MD Inpatient   01/17/2017 0338 01/20/2017 1834 Full Code [id number]  Harrie Foreman, MD ED   11/13/2016 1013 11/20/2016 1256 Full Code [id number]  Hillary Bow, MD ED    01/09/2015 1019 01/10/2015 1518 Full Code [id number]  Max Sane, MD Inpatient   12/18/2014 1620 12/19/2014 1627 Full Code [id number]  Charolette Forward, MD Inpatient   12/14/2014 1600 12/18/2014 1620 Full Code [id number]  Charolette Forward, MD Inpatient   12/13/2014 1324 12/14/2014 1600 Full Code [id number]  Charolette Forward, MD Inpatient   Advance Care Planning Activity           Consults vascular, podiatry   DVT Prophylaxis  Lovenox    Lab Results  Component Value Date   PLT 336 05/23/2019     Time Spent in minutes   30min  Greater than 50% of time spent in care coordination and counseling patient regarding the condition and plan of care.   Dustin Flock M.D on 05/23/2019 at 3:19 PM  Between 7am to 6pm - Pager - [phone]  After 6pm go to [email or web address] - Proofreader  [employer]   Office  [phone]

## 2019-05-23 NOTE — Consult Note (Signed)
PODIATRY / FOOT AND ANKLE SURGERY CONSULTATION NOTE  Requesting Physician: Demetrios Loll MD  Reason for consult: L hallux infection, gangrene  Chief Complaint: Black L big toe   HPI: Scott [name] is a 58 y.o. male who presents with gangrenous changes to the L big toe.  Pt states that he has noticed increased redness and swelling to his L big toe since last week but that his toe has been blackening for about 3-4 months.  Pt was seen in the ED and was subsequently admitted for further work-up.  Patient rates his pain today at 1/10.  Patient states that he recently had vascular intervention consisting of revascularization of the left lower extremity by [facility].  He states he is a type II diabetic and does have numbness and tingling to both feet but does not take insulin as his last hemoglobin A1c was around 5% per the patient.  Patient states that he does not smoke.  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  PMHx:  Past Medical History:  Diagnosis Date  . Arthritis    "left arm; right leg" (12/13/2014)  . Asthma   . CHF (congestive heart failure) ([place])   . Chronic disease anemia    [name] 12/13/2014  . Chronic kidney disease (CKD), stage IV (severe) ([place])    [name] 12/13/2014... on dialysis  . Complication of anesthesia    unable to urinate after CAPD urgery  . Continuous ambulatory peritoneal dialysis status ([place])   . Coronary artery disease    [name] 12/13/2014  . Depression   . Dysrhythmia    patient unaware of irregular heartbeat  . GERD (gastroesophageal reflux disease)   . High cholesterol    [name] 12/13/2014  . Hypertension   . Non-Q wave myocardial infarction ([place])    [name] 12/13/2014  . PVD (peripheral vascular disease) ([place])    [name] 12/13/2014  . Type II diabetes mellitus ([place])     Surgical Hx:  Past Surgical History:  Procedure Laterality Date  . AV FISTULA PLACEMENT Right 04/07/2017    Procedure: ARTERIOVENOUS (AV) FISTULA CREATION ( BRACHIALCEPHALIC );  Surgeon: Katha Cabal, MD;  Location: [facility] ORS;  Service: Vascular;  Laterality: Right;  . CAPD INSERTION N/A 04/07/2017   Procedure: LAPAROSCOPIC INSERTION CONTINUOUS AMBULATORY PERITONEAL DIALYSIS  (CAPD) CATHETER;  Surgeon: Katha Cabal, MD;  Location: [facility] OR[facility];  Service: Vascular;  Laterality: N/A;  . CARDIAC CATHETERIZATION  11/2014  . COLONOSCOPY WITH PROPOFOL N/A 12/01/2017   Procedure: COLONOSCOPY WITH PROPOFOL;  Surgeon: Lin Landsman, MD;  Location: [place];  Service: Endoscopy;  Laterality: N/A;  Diabetic - insulin  . COLONOSCOPY WITH PROPOFOL N/A 12/29/2017   Procedure: COLONOSCOPY WITH PROPOFOL;  Surgeon: Lin Landsman, MD;  Location: [place];  Service: Endoscopy;  Laterality: N/A;  . DIALYSIS/PERMA CATHETER INSERTION N/A 01/18/2017   Procedure: Dialysis/Perma Catheter Insertion;  Surgeon: Algernon Huxley, MD;  Location: [place] CV LAB;  Service: Cardiovascular;  Laterality: N/A;  . DIALYSIS/PERMA CATHETER REMOVAL N/A 07/26/2017   Procedure: DIALYSIS/PERMA CATHETER REMOVAL;  Surgeon: Algernon Huxley, MD;  Location: [place] CV LAB;  Service: Cardiovascular;  Laterality: N/A;  . INCISION AND DRAINAGE OF WOUND Right ~ 10/2014   "4th toe foot"  . PERCUTANEOUS CORONARY STENT INTERVENTION (PCI-S) N/A 12/14/2014   Procedure: PERCUTANEOUS CORONARY STENT INTERVENTION (PCI-S);  Surgeon: Charolette Forward, MD;  Location:  [place] CATH LAB;  Service: Cardiovascular;  Laterality: N/A;  . PERCUTANEOUS CORONARY STENT INTERVENTION (PCI-S) N/A 12/18/2014   Procedure: PERCUTANEOUS CORONARY STENT INTERVENTION (PCI-S);  Surgeon: Charolette Forward, MD;  Location: [facility] CATH LAB;  Service: Cardiovascular;  Laterality: N/A;  . POLYPECTOMY  12/01/2017   Procedure: POLYPECTOMY;  Surgeon: Lin Landsman, MD;  Location: [place];  Service: Endoscopy;;  . POLYPECTOMY  12/29/2017   Procedure: POLYPECTOMY;   Surgeon: Lin Landsman, MD;  Location: [place];  Service: Endoscopy;;  . TOE AMPUTATION Right 2015   4th toe    FHx:  Family History  Problem Relation Age of Onset  . Cancer Mother        Lung  . Cancer Father        Lung  . Diabetes Sister   . Diabetes Brother   . CAD Brother   . Hernia Son   . Cancer Brother     Social History:  reports that he quit smoking about 4 years ago. His smoking use included cigarettes. He started smoking about 34 years ago. He has a 30.00 pack-year smoking history. He has never used smokeless tobacco. He reports current alcohol use. He reports that he does not use drugs.  Allergies: No Known Allergies  Review of Systems: General ROS: negative Psychological ROS: negative Allergy and Immunology ROS: negative Respiratory ROS: no cough, shortness of breath, or wheezing Cardiovascular ROS: no chest pain or dyspnea on exertion Gastrointestinal ROS: no abdominal pain, change in bowel habits, or black or bloody stools Musculoskeletal ROS: positive for - joint pain and joint swelling Neurological ROS: positive for - numbness/tingling Dermatological ROS: Left hallux gangrene  Medications Prior to Admission  Medication Sig Dispense Refill  . albuterol (PROVENTIL HFA;VENTOLIN HFA) 108 (90 BASE) MCG/ACT inhaler Inhale 2 puffs into the lungs every 6 (six) hours as needed for wheezing or shortness of breath. 1 Inhaler 2  . amLODipine (NORVASC) 10 MG tablet Take 10 mg by mouth daily.    [name] aspirin EC 81 MG tablet Take 1 tablet (81 mg total) by mouth daily. 30 tablet 0  . B Complex-C-Folic Acid (DIALYVITE 101) 0.8 MG TABS Take 1 tablet by mouth daily.  6  . calcitRIOL (ROCALTROL) 0.25 MCG capsule Take 0.25 mcg by mouth daily.    . calcium acetate (PHOSLO) 667 MG capsule Take 2,001 mg by mouth 3 (three) times daily with meals. Takes 1-2 caps if he has snacks    . carvedilol (COREG) 25 MG tablet Take 1 tablet (25 mg total) by mouth 2 (two) times  daily with a meal. 180 tablet 3  . Cholecalciferol (VITAMIN D) 2000 units tablet Take 2,000 Units by mouth daily. Unsure how many units    . furosemide (LASIX) 40 MG tablet Take 2 tablets (80 mg total) by mouth daily for 30 days. 60 tablet 0  . gentamicin cream (GARAMYCIN) 0.1 % Apply 1 application topically daily.    . hydrALAZINE (APRESOLINE) 25 MG tablet Take 1 tablet (25 mg total) by mouth 3 (three) times daily. (Patient taking differently: Take 25 mg by mouth 3 (three) times daily. Pt states he only takes once a day) 90 tablet 1  . HYDROcodone-acetaminophen (NORCO/VICODIN) 5-325 MG tablet Take 1-2 tablets by mouth every 4 (four) hours as needed for moderate pain. 15 tablet 0  . insulin aspart protamine- aspart (NOVOLOG MIX 70/30) (70-30) 100 UNIT/ML injection Inject 0.25 mLs (25 Units total) into the skin 2 (two) times daily with a  meal. (Patient taking differently: Inject 20-25 Units into the skin 2 (two) times daily with a meal. ) 20 mL 1  . nitroGLYCERIN (NITROSTAT) 0.4 MG SL tablet Place 0.4 mg under the tongue every 5 (five) minutes as needed for chest pain. Pt needs new Rx. Bottle has expried    . omeprazole (PRILOSEC) 20 MG capsule Take 1 capsule (20 mg total) by mouth daily. 30 capsule 0  . ONE TOUCH ULTRA TEST test strip     . prasugrel (EFFIENT) 10 MG TABS tablet Take 10 mg by mouth daily.      Physical Exam: General: Alert and oriented.  No apparent distress. Vascular: DP/PT pulses faintly palpable bilateral, capillary fill time intact to digits that remained bilateral with the exception of the left hallux which is absent.  Erythema and edema noted to the left hallux proximal to the area of gangrene with mild amount of proximal erythema streaking.  Neuro: Light touch sensation reduced to digits bilateral.  Derm: Left hallux appears to have dry stable gangrene present with proximal erythema and streaking with edema present.  Necrosis appears to be to the level of bone at the left  hallux to the base of the proximal phalanx of the hallux.       MSK: Pain on palpation left hallux/forefoot.  5 out of 5 strength of bilateral lower extremity musculoskeletal groups.  Right third toe amputation.  Results for orders placed or performed during the hospital encounter of 05/22/19 (from the past 48 hour(s))  Lactic acid, plasma     Status: None   Collection Time: 05/22/19  3:11 PM  Result Value Ref Range   Lactic Acid, Venous 1.1 0.5 - 1.9 mmol/L    Comment: Performed at [facility], [address] [place], [place] [address]  Comprehensive metabolic panel     Status: Abnormal   Collection Time: 05/22/19  3:11 PM  Result Value Ref Range   Sodium 137 135 - 145 mmol/L   Potassium 3.4 (L) 3.5 - 5.1 mmol/L   Chloride 99 98 - 111 mmol/L   CO2 21 (L) 22 - 32 mmol/L   Glucose, Bld 189 (H) 70 - 99 mg/dL   BUN 61 (H) 6 - 20 mg/dL   Creatinine, Ser 8.72 (H) 0.61 - 1.24 mg/dL   Calcium 7.4 (L) 8.9 - 10.3 mg/dL   Total Protein 6.8 6.5 - 8.1 g/dL   Albumin 2.5 (L) 3.5 - 5.0 g/dL   AST 30 15 - 41 U/L   ALT 34 0 - 44 U/L   Alkaline Phosphatase 139 (H) 38 - 126 U/L   Total Bilirubin 0.5 0.3 - 1.2 mg/dL   GFR calc non Af Amer 6 (L) >60 mL/min   GFR calc Af Amer 7 (L) >60 mL/min   Anion gap 17 (H) 5 - 15    Comment: Performed at [facility], [address] [place], [place] [address]  CBC with Differential     Status: Abnormal   Collection Time: 05/22/19  3:11 PM  Result Value Ref Range   WBC 23.4 (H) 4.0 - 10.5 K/uL   RBC 2.59 (L) 4.22 - 5.81 MIL/uL   Hemoglobin 8.1 (L) 13.0 - 17.0 g/dL   HCT 24.7 (L) 39.0 - 52.0 %   MCV 95.4 80.0 - 100.0 fL   MCH 31.3 26.0 - 34.0 pg   MCHC 32.8 30.0 - 36.0 g/dL   RDW 13.0 11.5 - 15.5 %   Platelets 398 150 - 400  K/uL   nRBC 0.0 0.0 - 0.2 %   Neutrophils Relative % 88 %   Neutro Abs 20.5 (H) 1.7 - 7.7 K/uL   Lymphocytes Relative 3 %   Lymphs Abs 0.6 (L) 0.7 - 4.0 K/uL   Monocytes Relative 6 %   Monocytes Absolute  1.5 (H) 0.1 - 1.0 K/uL   Eosinophils Relative 1 %   Eosinophils Absolute 0.3 0.0 - 0.5 K/uL   Basophils Relative 0 %   Basophils Absolute 0.1 0.0 - 0.1 K/uL   Immature Granulocytes 2 %   Abs Immature Granulocytes 0.47 (H) 0.00 - 0.07 K/uL    Comment: Performed at [facility], [address] [place], [place] [address]  Blood culture (routine x 2)     Status: None (Preliminary result)   Collection Time: 05/22/19  3:37 PM   Specimen: BLOOD  Result Value Ref Range   Specimen Description BLOOD LEFT ANTECUBITAL    Special Requests Blood Culture adequate volume    Culture      NO GROWTH < 24 HOURS Performed at [facility], [name] [place], [place] [address]    Report Status PENDING   Sedimentation rate     Status: Abnormal   Collection Time: 05/22/19  3:37 PM  Result Value Ref Range   Sed Rate 126 (H) 0 - 20 mm/hr    Comment: Performed at [facility], [address] [place], [place] [address]  C-reactive protein     Status: Abnormal   Collection Time: 05/22/19  3:37 PM  Result Value Ref Range   CRP 25.9 (H) <1.0 mg/dL    Comment: Performed at [facility], [place] [address], [place], [place] [address]  Blood culture (routine x 2)     Status: None (Preliminary result)   Collection Time: 05/22/19  3:42 PM   Specimen: BLOOD  Result Value Ref Range   Specimen Description BLOOD BLOOD LEFT FOREARM    Special Requests Blood Culture adequate volume    Culture      NO GROWTH < 24 HOURS Performed at [facility], [address] [place], [place] [address]    Report Status PENDING   SARS Coronavirus 2 [facility] order, Performed in [facility] hospital lab) Nasopharyngeal Nasopharyngeal Swab     Status: None   Collection Time: 05/22/19  3:53 PM   Specimen: Nasopharyngeal Swab  Result Value Ref Range   SARS Coronavirus 2 NEGATIVE NEGATIVE    Comment: (NOTE) If result is NEGATIVE SARS-CoV-2 target nucleic acids are NOT DETECTED. The  SARS-CoV-2 RNA is generally detectable in upper and lower  respiratory specimens during the acute phase of infection. The lowest  concentration of SARS-CoV-2 viral copies this assay can detect is 250  copies / mL. A negative result does not preclude SARS-CoV-2 infection  and should not be used as the sole basis for treatment or other  patient management decisions.  A negative result may occur with  improper specimen collection / handling, submission of specimen other  than nasopharyngeal swab, presence of viral mutation(s) within the  areas targeted by this assay, and inadequate number of viral copies  (<250 copies / mL). A negative result must be combined with clinical  observations, patient history, and epidemiological information. If result is POSITIVE SARS-CoV-2 target nucleic acids are DETECTED. The SARS-CoV-2 RNA is generally detectable in upper and lower  respiratory specimens dur ing the acute phase of infection.  Positive  results are indicative of active  infection with SARS-CoV-2.  Clinical  correlation with patient history and other diagnostic information is  necessary to determine patient infection status.  Positive results do  not rule out bacterial infection or co-infection with other viruses. If result is PRESUMPTIVE POSTIVE SARS-CoV-2 nucleic acids MAY BE PRESENT.   A presumptive positive result was obtained on the submitted specimen  and confirmed on repeat testing.  While 2019 novel coronavirus  (SARS-CoV-2) nucleic acids may be present in the submitted sample  additional confirmatory testing may be necessary for epidemiological  and / or clinical management purposes  to differentiate between  SARS-CoV-2 and other Sarbecovirus currently known to infect humans.  If clinically indicated additional testing with an alternate test  methodology [phone]) is advised. The SARS-CoV-2 RNA is generally  detectable in upper and lower respiratory sp ecimens during the acute   phase of infection. The expected result is Negative. Fact Sheet for Patients:  StrictlyIdeas.no Fact Sheet for Healthcare Providers: BankingDealers.co.za This test is not yet approved or cleared by the [place] FDA and has been authorized for detection and/or diagnosis of SARS-CoV-2 by FDA under an Emergency Use Authorization (EUA).  This EUA will remain in effect (meaning this test can be used) for the duration of the COVID-19 declaration under Section 564(b)(1) of the Act, 21 U.S.C. section 360bbb-3(b)(1), unless the authorization is terminated or revoked sooner. Performed at [facility], [address] [place], [place] [address]   Creatinine, serum     Status: Abnormal   Collection Time: 05/22/19  6:31 PM  Result Value Ref Range   Creatinine, Ser 8.89 (H) 0.61 - 1.24 mg/dL   GFR calc non Af Amer 6 (L) >60 mL/min   GFR calc Af Amer 7 (L) >60 mL/min    Comment: Performed at [facility], [address] [place], [place] [address]  Hemoglobin A1c     Status: Abnormal   Collection Time: 05/22/19  6:31 PM  Result Value Ref Range   Hgb A1c MFr Bld 6.1 (H) 4.8 - 5.6 %    Comment: (NOTE) Pre diabetes:          5.7%-6.4% Diabetes:              >6.4% Glycemic control for   <7.0% adults with diabetes    Mean Plasma Glucose 128.37 mg/dL    Comment: Performed at [facility] [address] [place], [place] [address]  Lactic acid, plasma     Status: None   Collection Time: 05/22/19  6:35 PM  Result Value Ref Range   Lactic Acid, Venous 1.2 0.5 - 1.9 mmol/L    Comment: Performed at [facility], [address] [place], [place] [address]  Glucose, capillary     Status: Abnormal   Collection Time: 05/22/19  9:29 PM  Result Value Ref Range   Glucose-Capillary 174 (H) 70 - 99 mg/dL   Comment 1 Notify RN   Basic metabolic panel     Status: Abnormal   Collection Time: 05/23/19  5:24 AM  Result  Value Ref Range   Sodium 137 135 - 145 mmol/L   Potassium 3.5 3.5 - 5.1 mmol/L   Chloride 103 98 - 111 mmol/L   CO2 20 (L) 22 - 32 mmol/L   Glucose, Bld 114 (H) 70 - 99 mg/dL   BUN 65 (H) 6 - 20 mg/dL   Creatinine, Ser 9.05 (H) 0.61 - 1.24 mg/dL   Calcium 7.2 (L) 8.9 - 10.3 mg/dL  GFR calc non Af Amer 6 (L) >60 mL/min   GFR calc Af Amer 7 (L) >60 mL/min   Anion gap 14 5 - 15    Comment: Performed at [facility], [address] [place], [place] [address]  CBC     Status: Abnormal   Collection Time: 05/23/19  5:24 AM  Result Value Ref Range   WBC 15.9 (H) 4.0 - 10.5 K/uL   RBC 2.32 (L) 4.22 - 5.81 MIL/uL   Hemoglobin 7.2 (L) 13.0 - 17.0 g/dL   HCT 22.0 (L) 39.0 - 52.0 %   MCV 94.8 80.0 - 100.0 fL   MCH 31.0 26.0 - 34.0 pg   MCHC 32.7 30.0 - 36.0 g/dL   RDW 12.8 11.5 - 15.5 %   Platelets 336 150 - 400 K/uL   nRBC 0.0 0.0 - 0.2 %    Comment: Performed at [facility], [address] [place], [place] [address]  Glucose, capillary     Status: Abnormal   Collection Time: 05/23/19  7:36 AM  Result Value Ref Range   Glucose-Capillary 111 (H) 70 - 99 mg/dL   Dg Foot Complete Left  Result Date: 05/22/2019 CLINICAL DATA:  Left great toe pain and redness EXAM: LEFT FOOT - COMPLETE 3+ VIEW COMPARISON:  None. FINDINGS: Obliquely oriented lucency involving the dorsal cortex of the great toe distal phalanx seen only on lateral view suggestive of a nondisplaced fracture. No definite corresponding abnormality on AP or oblique views. No definite cortical destruction or periostitis appreciated. Mild degenerative changes of the first MTP and IP joints of the great toe. Prominent plantar calcaneal spur. Severe vascular calcifications. Mild soft tissue irregularity along the medial soft tissues at the level of the great toe IP joint. IMPRESSION: 1. Findings suspicious for nondisplaced fracture of the great toe distal phalanx. Consider further evaluation with dedicated radiographs of  the digit as clinically indicated. 2. No specific radiographic evidence of acute osteomyelitis. Electronically Signed   By: Davina Poke M.D.   On: 05/22/2019 16:16    Blood pressure (!) 148/57, pulse 80, temperature (!) 97.4 F (36.3 C), temperature source Oral, resp. rate 20, height 5\' 10"  (6.578 m), weight 90.7 kg, SpO2 100 %.   Assessment 1. Left hallux dry gangrene 2. Cellulitis left hallux/forefoot 3. Diabetes type 2 with polyneuropathy 4. PVD status post revascularization  Plan -Examined both feet. -Reviewed x-rays and discussed in detail with patient. -Discussed all treatment options with the patient both conservative and surgical attempts at correction including potential risks and complications of surgical intervention and at this time the patient has elected for surgical intervention consisting of left partial first ray amputation.  Postoperative course discussed with patient in detail. -Prior to surgical intervention would like to have a vascular consultation.  Discussed this case with Dr. [name] who will see later today. -Plan for case tentatively Wednesday morning-05/24/2019. -Patient to be n.p.o. at midnight.  Orders placed. -Patient to be partial weightbearing with heel contact and surgical shoe to the left foot. -Dressing changes daily until surgery consisting of Betadine paint to the left hallux followed by dry sterile dressing. -Appreciate recommendations for antibiotics per medicine.  WBC trending downward at 15 today.  Caroline More 05/23/2019, 8:52 AM

## 2019-05-23 NOTE — Op Note (Signed)
[facility] Percutaneous Study/Intervention Procedural Note   Date of Surgery: 05/23/2019  Surgeon:  Katha Cabal, MD.  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 Introduction catheter into left lower extremity 3rd order catheter placement with additional third order 2. Contrast injection left lower extremity for distal runoff   3. Crosser atherectomy of the left anterior tibial artery, successful 4.  Percutaneous transluminal angioplasty left anterior tibial and dorsalis pedis artery to 2.5 mm             5.  Crosser atherectomy of the left tibioperoneal trunk and peroneal artery not successful                       6.     Percutaneous transluminal angioplasty of the left tibioperoneal trunk and peroneal artery                        7.   Star close closure right common femoral arteriotomy  Anesthesia: Conscious sedation was administered under my direct supervision by the interventional radiology RN. IV Versed plus fentanyl were utilized. Continuous ECG, pulse oximetry and blood pressure was monitored throughout the entire procedure. Conscious sedation was for a total of 175 minutes.  Sheath: 7 French destination sheath right common femoral retrograde  Contrast: 130 cc  Fluoroscopy Time: 37.9 minutes  Indications: [name] presents with gangrene of the left great toe.  He has known atherosclerotic occlusive disease and is status post superficial femoral artery and popliteal artery stenting several weeks ago at [facility].  He presented to the emergency room here to [facility] with worsening condition of his foot.  By clinical exam he is at risk for limb loss.  The risks and benefits are reviewed all questions answered patient agrees to  proceed.  Procedure: Scott [name] is a 58 y.o. y.o. male who was identified and appropriate procedural time out was performed. The patient was then placed supine on the table and prepped and draped in the usual sterile fashion.   Ultrasound was placed in the sterile sleeve and the right groin was evaluated the right common femoral artery was echolucent and pulsatile indicating patency.  Image was recorded for the permanent record and under real-time visualization a microneedle was inserted into the common femoral artery microwire followed by a micro-sheath.  A J-wire was then advanced through the micro-sheath and a  5 [place] sheath was then inserted over a J-wire. J-wire was then advanced and a 5 French pigtail catheter was positioned at the level of T12. AP projection of the aorta was then obtained. Pigtail catheter was repositioned to above the bifurcation and a RAO view of the pelvis was obtained.  Subsequently a pigtail catheter with the stiff angle Glidewire was used to cross the aortic bifurcation the catheter wire were advanced down into the left distal external iliac artery. Oblique view of the femoral bifurcation was then obtained and subsequently the wire was reintroduced and the pigtail catheter negotiated into the SFA representing third order catheter placement. Distal runoff was then performed.  Diagnostic interpretation: The abdominal aorta is opacified with a bolus injection of contrast.  There is nonvisualization of the renal arteries consistent with his end-stage renal disease.  There is diffuse atherosclerotic changes of the aorta and iliac system but there are no hemodynamically significant lesions noted.  The left common femoral is diffusely diseased but without hemodynamically significant stenosis.  There is a borderline 50% stenosis at the origin of the SFA.  There is diffuse disease throughout the remainder the SFA previously placed stent is noted at Hunter's canal extending  into the mid popliteal and is widely patent.  The profunda femoris is noted to have a 90% stenosis at its origin is diffusely diseased more distally.  It collateralizes poorly to the distal left lower extremity arterial system.  There is an aberrant takeoff of the anterior tibial artery from the mid popliteal.  Throughout the anterior tibial artery there is diffuse disease with greater than 70% stenoses noted at several locations there appears to be a short segment occlusion proximally.  Distally there is an occlusion at the level of the malleolar line.  The dorsalis pedis artery is reconstituted via collaterals at the level of the mortise and fills the pedal arch.  The tibioperoneal trunk is diffusely diseased with a densely calcified short segment occlusion.  There is diffuse disease in the proximal one third of the peroneal.  The distal two thirds of the peroneal are reconstituted and appear to be fairly robust with multiple collaterals filling the medial and lateral plantar vessels.  The posterior tibial is occluded at its origin remains occluded throughout its entire length.  Based on these images I elected to move forward with intervention for limb salvage  5000 units of heparin was then given and allowed to circulate and a 6 [place] Raby sheath was advanced up and over the bifurcation and positioned in the femoral artery.  Later in the case I discovered the Raby sheath was buckling into the aorta and proceeded to exchange the Raby for a 7 [place] destination sheath.  The S6 Crosser catheter was then prepped on the field and a straight Usher catheter was advanced into the cul-de-sac of the anterior tibial. Using the Crosser catheter the S6 was negotiated down to the distal anterior tibial.  Then using a combination of wires and catheters I was able to negotiate the wire all the way to the pedal arch.  The catheter wire combination that were successful was the CXI 014 with a run-through wire again 014.  I  was then able to use a 2 mm balloon and advance this out across the ankle joint.  Inflation was to 10 atm for a minute.  Next a 2.5 mm x 30 Ultraverse balloon was advanced from the dorsum of the foot proximally.  Inflation was to 10 atm for 1 minute.  Following this a 3 mm x 15 cm ultra versed balloon was used to treat the proximal portion of the anterior tibial.  Distal runoff was then completed by hand injection showing patency of the anterior tibial with less than 10% residual stenosis and filling of the pedal arch.  I then pulled the catheter wire back into the popliteal and advanced the wire into the tibioperoneal trunk.  Hand-injection contrast and magnified imaging demonstrated a disease within the tibioperoneal trunk and peroneal.  I again introduced the S6 however the S6 would not cross completely into the mid peroneal.  I was also unable to advance a balloon across the entire length.  I was able to get a CXI catheter down to the origin of the peroneal.  Given the amount of contrast that had been used in association with the fluoroscopy time and the fact that we had successfully recanalized the anterior tibial completely I elected to terminate the  procedure at this point.  After review of these images the sheath is pulled into the right external iliac oblique of the common femoral is obtained and a Star close device deployed. There no immediate Complications.  Findings:  The abdominal aorta is opacified with a bolus injection of contrast.  There is nonvisualization of the renal arteries consistent with his end-stage renal disease.  There is diffuse atherosclerotic changes of the aorta and iliac system but there are no hemodynamically significant lesions noted.  The left common femoral is diffusely diseased but without hemodynamically significant stenosis.  There is a borderline 50% stenosis at the origin of the SFA.  There is diffuse disease throughout the remainder the SFA previously placed stent  is noted at Hunter's canal extending into the mid popliteal and is widely patent.  The profunda femoris is noted to have a 90% stenosis at its origin is diffusely diseased more distally.  It collateralizes poorly to the distal left lower extremity arterial system.  There is an aberrant takeoff of the anterior tibial artery from the mid popliteal.  Throughout the anterior tibial artery there is diffuse disease with greater than 70% stenoses noted at several locations there appears to be a short segment occlusion proximally.  Distally there is an occlusion at the level of the malleolar line.  The dorsalis pedis artery is reconstituted via collaterals at the level of the mortise and fills the pedal arch.  The tibioperoneal trunk is diffusely diseased with a densely calcified short segment occlusion.  There is diffuse disease in the proximal one third of the peroneal.  The distal two thirds of the peroneal are reconstituted and appear to be fairly robust with multiple collaterals filling the medial and lateral plantar vessels.  The posterior tibial is occluded at its origin remains occluded throughout its entire length.  Following crosser atherectomy and recanalization I was able to now advance the wire the length of the anterior tibial down to the level of the pedal arch and serial angioplasty beginning with 2 mm in diameter and increasing through 2.5-3 in the more proximal segment successfully reopened the anterior tibial with less than 10% residual stenosis.  Following negotiating the catheter into the tibioperoneal trunk and peroneal I was able to image the peroneal however the Crosser catheter was not able to successfully cross the entire length and I was not able to get the balloon across to achieve effective recanalization of the peroneal.  Should increase perfusion be deemed necessary for wound healing in the future a second attempt at recanalizing the peroneal would certainly be  worthwhile.   Disposition: Patient was taken to the recovery room in stable condition having tolerated the procedure well.  Belenda Cruise Orabelle Rylee 05/23/2019,1:31 PM

## 2019-05-23 NOTE — Consult Note (Signed)
[facility] Vascular Consult Note  MRN : [id number]  Scott [name] is a 58 y.o. (13-May-1961) male who presents with chief complaint of  Chief Complaint  Patient presents with  . Blood Infection  . Toe Pain   History of Present Illness:  The patient is a 58 year old male with multiple medical issues (see below) with a known history of peripheral artery disease.  The patient has a past surgical history of toe amputation.  Patient with a recent attempt at endovascular revascularization of the left lower extremity at [facility].  The patient presents to the [facility] emergency department on May 22, 2019 with a chief complaint of "possible gangrene to his left foot great toe".  Patient endorses a history of progressively worsening discoloration to the big toe on his left foot.  Patient also states that that is "cold".  He denies any pain.  He notes that this discoloration started with a "small black spot" about 3 to 4 months ago and is possibly worsened.  He denies any fever, nausea vomiting.  Denies any shortness of breath or chest pain.  The patient was seen by podiatry ([name]) this morning who would like to amputate his first toe on the left foot tomorrow and has asked [place] to consult for possible revascularization.  Current Facility-Administered Medications  Medication Dose Route Frequency Provider Last Rate Last Dose  . fentaNYL (SUBLIMAZE) 100 MCG/2ML injection           . heparin 1000 UNIT/ML injection           . midazolam (VERSED) 5 MG/5ML injection           . 0.9 %  sodium chloride infusion   Intravenous Continuous Georg Ang A, PA-C 10 mL/hr at 05/23/19 1008    . [MAR Hold] acetaminophen (TYLENOL) tablet 650 mg  650 mg Oral Q6H PRN Demetrios Loll, MD       Or  . Doug Sou Hold] acetaminophen (TYLENOL) suppository 650 mg  650 mg Rectal Q6H PRN Demetrios Loll, MD      . Doug Sou Hold] albuterol (PROVENTIL) (2.5 MG/3ML) 0.083% nebulizer  solution 2.5 mg  2.5 mg Nebulization Q2H PRN Demetrios Loll, MD      . Doug Sou Hold] amLODipine (NORVASC) tablet 10 mg  10 mg Oral Daily Demetrios Loll, MD      . Doug Sou Hold] bisacodyl (DULCOLAX) EC tablet 5 mg  5 mg Oral Daily PRN Demetrios Loll, MD      . Doug Sou Hold] calcitRIOL (ROCALTROL) capsule 0.25 mcg  0.25 mcg Oral Daily Demetrios Loll, MD      . Doug Sou Hold] calcium acetate (PHOSLO) capsule 2,001 mg  2,001 mg Oral TID WC Demetrios Loll, MD      . Doug Sou Hold] carvedilol (COREG) tablet 25 mg  25 mg Oral BID WC Demetrios Loll, MD      . Derrill Memo ON 05/24/2019] ceFAZolin (ANCEF) IVPB 1 g/50 mL premix  1 g Intravenous Once Haili Donofrio A, PA-C 100 mL/hr at 05/23/19 1024 1 g at 05/23/19 1024  . [MAR Hold] Chlorhexidine Gluconate Cloth 2 % PADS 6 each  6 each Topical Daily Demetrios Loll, MD   6 each at 05/23/19 0545  . [MAR Hold] cholecalciferol (VITAMIN D3) tablet 2,000 Units  2,000 Units Oral Daily Demetrios Loll, MD      . diphenhydrAMINE (BENADRYL) injection 50 mg  50 mg Intravenous Once PRN Kashara Blocher, Joelene Millin A, PA-C      . famotidine (PEPCID)  tablet 40 mg  40 mg Oral Once PRN Amarrah Meinhart, Janalyn Harder, PA-C      . [MAR Hold] furosemide (LASIX) tablet 80 mg  80 mg Oral Daily Demetrios Loll, MD      . Doug Sou Hold] heparin injection 5,000 Units  5,000 Units Subcutaneous Q8H Demetrios Loll, MD   5,000 Units at 05/23/19 [phone]  . [MAR Hold] hydrALAZINE (APRESOLINE) tablet 25 mg  25 mg Oral Daily Demetrios Loll, MD      . Doug Sou Hold] HYDROcodone-acetaminophen (NORCO/VICODIN) 5-325 MG per tablet 1-2 tablet  1-2 tablet Oral Q4H PRN Demetrios Loll, MD   2 tablet at 05/22/19 2029  . [MAR Hold] HYDROmorphone (DILAUDID) injection 1 mg  1 mg Intravenous Once PRN Jourdyn Ferrin, Janalyn Harder, PA-C      . [MAR Hold] insulin aspart (novoLOG) injection 0-5 Units  0-5 Units Subcutaneous QHS Demetrios Loll, MD      . Doug Sou Hold] insulin aspart (novoLOG) injection 0-9 Units  0-9 Units Subcutaneous TID WC Demetrios Loll, MD      . Doug Sou Hold] insulin aspart protamine- aspart (NOVOLOG  MIX 70/30) injection 25 Units  25 Units Subcutaneous BID WC Demetrios Loll, MD      . methylPREDNISolone sodium succinate (SOLU-MEDROL) 125 mg/2 mL injection 125 mg  125 mg Intravenous Once PRN Carlas Vandyne A, PA-C      . midazolam (VERSED) 2 MG/ML syrup 8 mg  8 mg Oral Once PRN Nachman Sundt, Janalyn Harder, PA-C      . [MAR Hold] multivitamin (RENA-VIT) tablet 1 tablet  1 tablet Oral Daily Demetrios Loll, MD      . Doug Sou Hold] nitroGLYCERIN (NITROSTAT) SL tablet 0.4 mg  0.4 mg Sublingual Q5 min PRN Demetrios Loll, MD      . Doug Sou Hold] ondansetron [facility]) tablet 4 mg  4 mg Oral Q6H PRN Demetrios Loll, MD       Or  . Doug Sou Hold] ondansetron [facility]) injection 4 mg  4 mg Intravenous Q6H PRN Demetrios Loll, MD      . Doug Sou Hold] ondansetron [facility]) injection 4 mg  4 mg Intravenous Q6H PRN Alphonsine Minium, Janalyn Harder, PA-C      . [MAR Hold] pantoprazole (PROTONIX) EC tablet 40 mg  40 mg Oral Daily Demetrios Loll, MD      . Doug Sou Hold] senna-docusate (Senokot-S) tablet 1 tablet  1 tablet Oral QHS PRN Demetrios Loll, MD       Past Medical History:  Diagnosis Date  . Arthritis    "left arm; right leg" (12/13/2014)  . Asthma   . CHF (congestive heart failure) ([place])   . Chronic disease anemia    [name] 12/13/2014  . Chronic kidney disease (CKD), stage IV (severe) ([place])    [name] 12/13/2014... on dialysis  . Complication of anesthesia    unable to urinate after CAPD urgery  . Continuous ambulatory peritoneal dialysis status ([place])   . Coronary artery disease    [name] 12/13/2014  . Depression   . Dysrhythmia    patient unaware of irregular heartbeat  . GERD (gastroesophageal reflux disease)   . High cholesterol    [name] 12/13/2014  . Hypertension   . Non-Q wave myocardial infarction ([place])    [name] 12/13/2014  . PVD (peripheral vascular disease) ([place])    [name] 12/13/2014  . Type II diabetes mellitus ([place])    Past Surgical History:  Procedure Laterality Date  . AV FISTULA PLACEMENT Right 04/07/2017   Procedure: ARTERIOVENOUS  (AV) FISTULA CREATION ( BRACHIALCEPHALIC );  Surgeon: Katha Cabal, MD;  Location: [facility] ORS;  Service: Vascular;  Laterality: Right;  . CAPD INSERTION N/A 04/07/2017   Procedure: LAPAROSCOPIC INSERTION CONTINUOUS AMBULATORY PERITONEAL DIALYSIS  (CAPD) CATHETER;  Surgeon: Katha Cabal, MD;  Location: [facility] ORS;  Service: Vascular;  Laterality: N/A;  . CARDIAC CATHETERIZATION  11/2014  . COLONOSCOPY WITH PROPOFOL N/A 12/01/2017   Procedure: COLONOSCOPY WITH PROPOFOL;  Surgeon: Lin Landsman, MD;  Location: [place];  Service: Endoscopy;  Laterality: N/A;  Diabetic - insulin  . COLONOSCOPY WITH PROPOFOL N/A 12/29/2017   Procedure: COLONOSCOPY WITH PROPOFOL;  Surgeon: Lin Landsman, MD;  Location: [place];  Service: Endoscopy;  Laterality: N/A;  . DIALYSIS/PERMA CATHETER INSERTION N/A 01/18/2017   Procedure: Dialysis/Perma Catheter Insertion;  Surgeon: Algernon Huxley, MD;  Location: [place] CV [facility];  Service: Cardiovascular;  Laterality: N/A;  . DIALYSIS/PERMA CATHETER REMOVAL N/A 07/26/2017   Procedure: DIALYSIS/PERMA CATHETER REMOVAL;  Surgeon: Algernon Huxley, MD;  Location: [place] [facility];  Service: Cardiovascular;  Laterality: N/A;  . INCISION AND DRAINAGE OF WOUND Right ~ 10/2014   "4th toe foot"  . PERCUTANEOUS CORONARY STENT INTERVENTION (PCI-S) N/A 12/14/2014   Procedure: PERCUTANEOUS CORONARY STENT INTERVENTION (PCI-S);  Surgeon: Charolette Forward, MD;  Location: [facility] CATH LAB;  Service: Cardiovascular;  Laterality: N/A;  . PERCUTANEOUS CORONARY STENT INTERVENTION (PCI-S) N/A 12/18/2014   Procedure: PERCUTANEOUS CORONARY STENT INTERVENTION (PCI-S);  Surgeon: Charolette Forward, MD;  Location: [facility] CATH LAB;  Service: Cardiovascular;  Laterality: N/A;  . POLYPECTOMY  12/01/2017   Procedure: POLYPECTOMY;  Surgeon: Lin Landsman, MD;  Location: [place];  Service: Endoscopy;;  . POLYPECTOMY  12/29/2017   Procedure: POLYPECTOMY;  Surgeon: Lin Landsman, MD;  Location: [place];  Service: Endoscopy;;  . TOE AMPUTATION Right 2015   4th toe   Social History Social History   Tobacco Use  . Smoking status: Former Smoker    Packs/day: 1.00    Years: 30.00    Pack years: 30.00    Types: Cigarettes    Start date: 08/31/1984    Quit date: 11/30/2014    Years since quitting: 4.4  . Smokeless tobacco: Never Used  Substance Use Topics  . Alcohol use: Yes    Comment: Rarely, twice a year.  . Drug use: No   Family History Family History  Problem Relation Age of Onset  . Cancer Mother        Lung  . Cancer Father        Lung  . Diabetes Sister   . Diabetes Brother   . CAD Brother   . Hernia Son   . Cancer Brother   He denies any family history of peripheral artery disease, venous disease or renal disease  No Known Allergies  REVIEW OF SYSTEMS (Negative unless checked)  Constitutional: [] Weight loss  [] Fever  [] Chills Cardiac: [] Chest pain   [] Chest pressure   [] Palpitations   [] Shortness of breath when laying flat   [] Shortness of breath at rest   [] Shortness of breath with exertion. Vascular:  [] Pain in legs with walking   [] Pain in legs at rest   [] Pain in legs when laying flat   [] Claudication   [] Pain in feet when walking  [] Pain in feet at rest  [] Pain in feet when laying flat   [] History of DVT   [] Phlebitis   [] Swelling in legs   [] Varicose veins   [x] Non-healing ulcers Pulmonary:   [] Uses  home oxygen   [] Productive cough   [] Hemoptysis   [] Wheeze  [] COPD   [] Asthma Neurologic:  [] Dizziness  [] Blackouts   [] Seizures   [] History of stroke   [] History of TIA  [] Aphasia   [] Temporary blindness   [] Dysphagia   [] Weakness or numbness in arms   [] Weakness or numbness in legs Musculoskeletal:  [] Arthritis   [] Joint swelling   [] Joint pain   [] Low back pain Hematologic:  [] Easy bruising  [] Easy bleeding   [] Hypercoagulable state   [] Anemic  [] Hepatitis Gastrointestinal:  [] Blood in stool   [] Vomiting blood   [] Gastroesophageal reflux/heartburn   [] Difficulty swallowing. Genitourinary:  [x] Chronic kidney disease   [] Difficult urination  [] Frequent urination  [] Burning with urination   [] Blood in urine Skin:  [] Rashes   [x] Ulcers   [x] Wounds Psychological:  [] History of anxiety   []  History of major depression.  Physical Examination  Vitals:   05/23/19 0552 05/23/19 1000 05/23/19 1025 05/23/19 1025  BP: (!) 148/57 (!) 144/71 (!) 150/70   Pulse: 80 84 83   Resp: 20 16 16    Temp: (!) 97.4 F (36.3 C)     TempSrc: Oral     SpO2: 100% 94% 94% 98%  Weight:  90.7 kg    Height:  5\' 10"  (1.778 m)     Body mass index is 28.69 kg/m. Gen:  WD/WN, NAD Head: [place]/AT, No temporalis wasting. Prominent temp pulse not noted. Ear/Nose/Throat: Hearing grossly intact, nares w/o erythema or drainage, oropharynx w/o Erythema/Exudate Eyes: Sclera non-icteric, conjunctiva clear Neck: Trachea midline.  No JVD.  Pulmonary:  Good air movement, respirations not labored, equal bilaterally.  Cardiac: RRR, normal S1, S2. Vascular:  Vessel Right Left  Radial Palpable Palpable  Ulnar Palpable Palpable  Brachial Palpable Palpable  Carotid Palpable, without bruit Palpable, without bruit  Aorta Not palpable N/A  Femoral Palpable Palpable  Popliteal Palpable Palpable  PT Non-Palpable Non-Palpable  DP Non-Palpable Non-Palpable   Right Lower Extremity: Fourth toe has been amputated. No active wounds.  Nonpalpable pedal pulses  Left lower extremity: Tip of first toe left foot gangrenous.  Nonpalpable pedal pulses.    Document Information Photos    05/23/2019 08:35  Attached To:  Hospital Encounter on 05/22/19  Source Information Caroline More, DPM  [facility]   Gastrointestinal: soft, non-tender/non-distended. No guarding/reflex.  Musculoskeletal: M/S 5/5 throughout.   Neurologic: Sensation grossly intact in extremities.  Symmetrical.  Speech is fluent. Motor exam as listed above. Psychiatric:  Judgment intact, Mood & affect appropriate for pt's clinical situation. Dermatologic: As above Lymph : No Cervical, Axillary, or Inguinal lymphadenopathy.  CBC Lab Results  Component Value Date   WBC 15.9 (H) 05/23/2019   HGB 7.2 (L) 05/23/2019   HCT 22.0 (L) 05/23/2019   MCV 94.8 05/23/2019   PLT 336 05/23/2019   BMET    Component Value Date/Time   NA 137 05/23/2019 0524   NA 140 04/16/2015   NA 140 12/13/2014 0102   K 3.5 05/23/2019 0524   K 4.6 12/13/2014 0102   CL 103 05/23/2019 0524   CL 111 12/13/2014 0102   CO2 20 (L) 05/23/2019 0524   CO2 23 12/13/2014 0102   GLUCOSE 114 (H) 05/23/2019 0524   GLUCOSE 213 (H) 12/13/2014 0102   BUN 65 (H) 05/23/2019 0524   BUN 44 (A) 04/16/2015   BUN 25 (H) 12/13/2014 0102   CREATININE 9.05 (H) 05/23/2019 0524   CREATININE 1.95 (H) 12/13/2014 0102   CALCIUM 7.2 (L) 05/23/2019 1610  CALCIUM 8.0 (L) 12/13/2014 0102   GFRNONAA 6 (L) 05/23/2019 0524   GFRNONAA 37 (L) 12/13/2014 0102   GFRAA 7 (L) 05/23/2019 0524   GFRAA 43 (L) 12/13/2014 0102   Estimated Creatinine Clearance: 9.8 mL/min (A) (by C-G formula based on SCr of 9.05 mg/dL (H)).  COAG Lab Results  Component Value Date   INR 0.99 03/25/2017   INR 1.08 01/10/2015   INR 1.10 12/14/2014   Radiology Dg Foot Complete Left  Result Date: 05/22/2019 CLINICAL DATA:  Left great toe pain and redness EXAM: LEFT FOOT - COMPLETE 3+ VIEW COMPARISON:  None. FINDINGS: Obliquely oriented lucency involving the dorsal cortex of the great toe distal phalanx seen only on lateral view suggestive of a nondisplaced fracture. No definite corresponding abnormality on AP or oblique views. No definite cortical destruction or periostitis appreciated. Mild degenerative changes of the first MTP and IP joints of the great toe. Prominent plantar calcaneal spur. Severe vascular calcifications. Mild soft tissue irregularity along the medial soft tissues at the level of the great toe IP joint. IMPRESSION:  1. Findings suspicious for nondisplaced fracture of the great toe distal phalanx. Consider further evaluation with dedicated radiographs of the digit as clinically indicated. 2. No specific radiographic evidence of acute osteomyelitis. Electronically Signed   By: Davina Poke M.D.   On: 05/22/2019 16:16   Assessment/Plan The patient is a 58 year old male with multiple medical issues with a known history of peripheral artery disease.  The patient has a past surgical history of toe amputation.  Patient with a recent attempt at endovascular revascularization of the left lower extremity at [facility].  The patient presents to the [facility] emergency department on May 22, 2019 with a chief complaint of "possible gangrene to his left foot great toe".  1.  Peripheral artery disease: Patient with known history of peripheral artery disease status post toe amputation to the right lower extremity in the past.  Patient presents now with a gangrenous first toe on the left foot.  Patient for amputation tomorrow with podiatry.  Recommend a left lower extremity angiogram possible intervention to assess the patient's anatomy and degree of contributing peripheral artery disease.  If appropriate an attempt to revascularize leg can be made at that time.  Procedure risks and benefits explained to the patient all questions answered the patient wishes to proceed. 2.  Diabetes: On appropriate medications however the patient seems to be noncompliant. Encouraged good control as its slows the progression of atherosclerotic disease 3.  Hypertension: On appropriate medications. Encouraged good control as its slows the progression of atherosclerotic disease  Discussed with Dr. Francene Castle, PA-C  05/23/2019 10:28 AM   This note was created with Dragon medical transcription system.  Any error is purely unintentional

## 2019-05-23 NOTE — Progress Notes (Signed)
[facility]  ROUNDING NOTE   Subjective:  Patient seen at bedside. Here for amputation of left first toe. Has severe peripheral arterial disease. We are asked to reinitiate peritoneal dialysis.   Objective:  Vital signs in last 24 hours:  Temp:  [97.3 F (36.3 C)-98.1 F (36.7 C)] 97.3 F (36.3 C) (09/22 1416) Pulse Rate:  [75-84] 75 (09/22 1416) Resp:  [16-23] 19 (09/22 1416) BP: (124-164)/(44-78) 150/70 (09/22 1416) SpO2:  [94 %-100 %] 94 % (09/22 1416) Weight:  [90.7 kg] 90.7 kg (09/22 1000)  Weight change:  Filed Weights   05/22/19 1509 05/23/19 1000  Weight: 90.7 kg 90.7 kg    Intake/Output: I/O last 3 completed shifts: In: 771.8 [P.O.:240; IV Piggyback:531.8] Out: 300 [Urine:300]   Intake/Output this shift:  No intake/output data recorded.  Physical Exam: General: No acute distress  Head: Normocephalic, atraumatic. Moist oral mucosal membranes  Eyes: Anicteric  Neck: Supple, trachea midline  Lungs:  Clear to auscultation, normal effort  Heart: S1S2 no rubs  Abdomen:  Soft, nontender, bowel sounds present  Extremities: 1+ peripheral edema.  Neurologic: Awake, alert, following commands  Skin: No lesions  Access: PD catheter in place    Basic Metabolic Panel: Recent Labs  Lab 05/22/19 1511 05/22/19 1831 05/23/19 0524  NA 137  --  137  K 3.4*  --  3.5  CL 99  --  103  CO2 21*  --  20*  GLUCOSE 189*  --  114*  BUN 61*  --  65*  CREATININE 8.72* 8.89* 9.05*  CALCIUM 7.4*  --  7.2*    Liver Function Tests: Recent Labs  Lab 05/22/19 1511  AST 30  ALT 34  ALKPHOS 139*  BILITOT 0.5  PROT 6.8  ALBUMIN 2.5*   No results for input(s): LIPASE, AMYLASE in the last 168 hours. No results for input(s): AMMONIA in the last 168 hours.  CBC: Recent Labs  Lab 05/22/19 1511 05/23/19 0524  WBC 23.4* 15.9*  NEUTROABS 20.5*  --   HGB 8.1* 7.2*  HCT 24.7* 22.0*  MCV 95.4 94.8  PLT 398 336    Cardiac Enzymes: No results for  input(s): CKTOTAL, CKMB, CKMBINDEX, TROPONINI in the last 168 hours.  BNP: Invalid input(s): POCBNP  CBG: Recent Labs  Lab 05/22/19 2129 05/23/19 0736 05/23/19 1009 05/23/19 1318 05/23/19 1654  GLUCAP 174* 111* 129* 149* 144*    Microbiology: Results for orders placed or performed during the hospital encounter of 05/22/19  Blood culture (routine x 2)     Status: None (Preliminary result)   Collection Time: 05/22/19  3:37 PM   Specimen: BLOOD  Result Value Ref Range Status   Specimen Description BLOOD LEFT ANTECUBITAL  Final   Special Requests Blood Culture adequate volume  Final   Culture   Final    NO GROWTH < 24 HOURS Performed at [facility], [address] [place], [place] [address]    Report Status PENDING  Incomplete  Blood culture (routine x 2)     Status: None (Preliminary result)   Collection Time: 05/22/19  3:42 PM   Specimen: BLOOD  Result Value Ref Range Status   Specimen Description BLOOD BLOOD LEFT FOREARM  Final   Special Requests Blood Culture adequate volume  Final   Culture   Final    NO GROWTH < 24 HOURS Performed at [facility], [address] [place], [place] [address]    Report Status PENDING  Incomplete  SARS Coronavirus  2 [facility] order, Performed in [facility] hospital lab) Nasopharyngeal Nasopharyngeal Swab     Status: None   Collection Time: 05/22/19  3:53 PM   Specimen: Nasopharyngeal Swab  Result Value Ref Range Status   SARS Coronavirus 2 NEGATIVE NEGATIVE Final    Comment: (NOTE) If result is NEGATIVE SARS-CoV-2 target nucleic acids are NOT DETECTED. The SARS-CoV-2 RNA is generally detectable in upper and lower  respiratory specimens during the acute phase of infection. The lowest  concentration of SARS-CoV-2 viral copies this assay can detect is 250  copies / mL. A negative result does not preclude SARS-CoV-2 infection  and should not be used as the sole basis for treatment or other  patient management  decisions.  A negative result may occur with  improper specimen collection / handling, submission of specimen other  than nasopharyngeal swab, presence of viral mutation(s) within the  areas targeted by this assay, and inadequate number of viral copies  (<250 copies / mL). A negative result must be combined with clinical  observations, patient history, and epidemiological information. If result is POSITIVE SARS-CoV-2 target nucleic acids are DETECTED. The SARS-CoV-2 RNA is generally detectable in upper and lower  respiratory specimens dur ing the acute phase of infection.  Positive  results are indicative of active infection with SARS-CoV-2.  Clinical  correlation with patient history and other diagnostic information is  necessary to determine patient infection status.  Positive results do  not rule out bacterial infection or co-infection with other viruses. If result is PRESUMPTIVE POSTIVE SARS-CoV-2 nucleic acids MAY BE PRESENT.   A presumptive positive result was obtained on the submitted specimen  and confirmed on repeat testing.  While 2019 novel coronavirus  (SARS-CoV-2) nucleic acids may be present in the submitted sample  additional confirmatory testing may be necessary for epidemiological  and / or clinical management purposes  to differentiate between  SARS-CoV-2 and other Sarbecovirus currently known to infect humans.  If clinically indicated additional testing with an alternate test  methodology [phone]) is advised. The SARS-CoV-2 RNA is generally  detectable in upper and lower respiratory sp ecimens during the acute  phase of infection. The expected result is Negative. Fact Sheet for Patients:  StrictlyIdeas.no Fact Sheet for Healthcare Providers: BankingDealers.co.za This test is not yet approved or cleared by the [place] FDA and has been authorized for detection and/or diagnosis of SARS-CoV-2 by FDA under an  Emergency Use Authorization (EUA).  This EUA will remain in effect (meaning this test can be used) for the duration of the COVID-19 declaration under Section 564(b)(1) of the Act, 21 U.S.C. section 360bbb-3(b)(1), unless the authorization is terminated or revoked sooner. Performed at [facility], [address] [place], [place] [address]     Coagulation Studies: No results for input(s): LABPROT, INR in the last 72 hours.  Urinalysis: No results for input(s): COLORURINE, LABSPEC, PHURINE, GLUCOSEU, HGBUR, BILIRUBINUR, KETONESUR, PROTEINUR, UROBILINOGEN, NITRITE, LEUKOCYTESUR in the last 72 hours.  Invalid input(s): APPERANCEUR    Imaging: Dg Foot Complete Left  Result Date: 05/22/2019 CLINICAL DATA:  Left great toe pain and redness EXAM: LEFT FOOT - COMPLETE 3+ VIEW COMPARISON:  None. FINDINGS: Obliquely oriented lucency involving the dorsal cortex of the great toe distal phalanx seen only on lateral view suggestive of a nondisplaced fracture. No definite corresponding abnormality on AP or oblique views. No definite cortical destruction or periostitis appreciated. Mild degenerative changes of the first MTP and IP joints of the great toe. Prominent plantar calcaneal  spur. Severe vascular calcifications. Mild soft tissue irregularity along the medial soft tissues at the level of the great toe IP joint. IMPRESSION: 1. Findings suspicious for nondisplaced fracture of the great toe distal phalanx. Consider further evaluation with dedicated radiographs of the digit as clinically indicated. 2. No specific radiographic evidence of acute osteomyelitis. Electronically Signed   By: Davina Poke M.D.   On: 05/22/2019 16:16     Medications:   . sodium chloride 10 mL/hr at 05/23/19 1604  . sodium chloride    . piperacillin-tazobactam (ZOSYN)  IV 3.375 g (05/23/19 1605)   . amLODipine  10 mg Oral Daily  . atorvastatin  10 mg Oral q1800  . calcitRIOL  0.25 mcg Oral Daily  . calcium  acetate  2,001 mg Oral TID WC  . carvedilol  25 mg Oral BID WC  . chlorhexidine  60 mL Topical Once  . Chlorhexidine Gluconate Cloth  6 each Topical Daily  . cholecalciferol  2,000 Units Oral Daily  . [START ON 05/24/2019] clopidogrel  75 mg Oral Q breakfast  . feeding supplement  296 mL Oral Once  . fentaNYL      . furosemide  80 mg Oral Daily  . heparin      . heparin  5,000 Units Subcutaneous Q8H  . hydrALAZINE  25 mg Oral Daily  . insulin aspart  0-5 Units Subcutaneous QHS  . insulin aspart  0-9 Units Subcutaneous TID WC  . insulin aspart protamine- aspart  25 Units Subcutaneous BID WC  . midazolam      . multivitamin  1 tablet Oral Daily  . pantoprazole  40 mg Oral Daily  . povidone-iodine  2 application Topical Once  . sodium chloride flush  3 mL Intravenous Q12H  . vancomycin variable dose per unstable renal function (pharmacist dosing)   Does not apply See admin instructions   sodium chloride, [DISCONTINUED] acetaminophen **OR** acetaminophen, acetaminophen, albuterol, bisacodyl, HYDROcodone-acetaminophen, HYDROmorphone (DILAUDID) injection, morphine injection, nitroGLYCERIN, ondansetron (ZOFRAN) IV, ondansetron **OR** [DISCONTINUED] ondansetron (ZOFRAN) IV, oxyCODONE, senna-docusate, sodium chloride flush  Assessment/ Plan:  58 y.o. male with end stage renal disease on peritoneal dialysis, congestive heart failure, GERD, hypertension, peripheral vascular disease, diabetes mellitus type 2, diabetic retinopathy, diabetic gastroparesis, admitted with gangrene of left first toe.  [facility]/[address]  CAPD 1865mL fills 4 exchanges   1.  ESRD on PD. 2.  Gangrene of left first toe. 3.  Anemia of chronic kidney disease. 4.  Secondary hyperparathyroidism. 5.  Hypertension.  Plan: We will resume peritoneal dialysis tonight.  We will place the patient on CCPD with low volume of 1.8 L, 4 exchanges, 2.5% dextrose solution.  Serum electrolytes appear acceptable with the exception of  calcium that is a bit low.  Hemoglobin low at 7.2.  Consider blood transfusion but defer to primary team.   LOS: 1 Jeniece Hannis 9/22/20205:12 PM

## 2019-05-23 NOTE — Consult Note (Signed)
Pharmacy Antibiotic Note  Scott [name] is a 58 y.o. male admitted on 05/22/2019 with cellulitis/Left great toe gangrene infection with leukocytosis.  Patient is on continuous ambulatory peritoneal dialyses.    Pharmacy has been consulted for Vancomycin/Zosyn dosing.  Plan: Zosyn 3.375g IV q12h (4 hour infusion).   Patient received Vancomycin IV loading dose of 2000mg  9/21 @ 1753  Will obtain serum level in 3-4 days and re-dose once level <20 mg/L.   This interval will determine the dosing frequency  Height: 5\' 10"  (177.8 cm) Weight: 199 lb 15.3 oz (90.7 kg) IBW/kg (Calculated) : 73  Temp (24hrs), Avg:97.9 F (36.6 C), Min:97.4 F (36.3 C), Max:98.2 F (36.8 C)  Recent Labs  Lab 05/22/19 1511 05/22/19 1831 05/22/19 1835 05/23/19 0524  WBC 23.4*  --   --  15.9*  CREATININE 8.72* 8.89*  --  9.05*  LATICACIDVEN 1.1  --  1.2  --     Estimated Creatinine Clearance: 9.8 mL/min (A) (by C-G formula based on SCr of 9.05 mg/dL (H)).    No Known Allergies  Antimicrobials this admission: Vancomycin 9/21 >> Zosyn 9/21 >>  Dose adjustments this admission: None  Microbiology results: 9/21 BCx: pending  Thank you for allowing pharmacy to be a part of this patient's care.  Lu Duffel, PharmD, BCPS Clinical Pharmacist 05/23/2019 2:16 PM

## 2019-05-24 ENCOUNTER — Encounter: Payer: Self-pay | Admitting: Vascular Surgery

## 2019-05-24 ENCOUNTER — Encounter
Admission: EM | Disposition: A | Discharge status: Home Health | Payer: Self-pay | Source: Home / Self Care | Attending: Internal Medicine

## 2019-05-24 ENCOUNTER — Inpatient Hospital Stay: Payer: Medicare Other

## 2019-05-24 ENCOUNTER — Other Ambulatory Visit (INDEPENDENT_AMBULATORY_CARE_PROVIDER_SITE_OTHER): Payer: Self-pay | Admitting: Vascular Surgery

## 2019-05-24 ENCOUNTER — Inpatient Hospital Stay: Payer: Medicare Other | Admitting: Anesthesiology

## 2019-05-24 HISTORY — PX: AMPUTATION TOE: SHX6595

## 2019-05-24 LAB — BASIC METABOLIC PANEL
Anion gap: 16 — ABNORMAL HIGH (ref 5–15)
BUN: 66 mg/dL — ABNORMAL HIGH (ref 6–20)
CO2: 21 mmol/L — ABNORMAL LOW (ref 22–32)
Calcium: 7.8 mg/dL — ABNORMAL LOW (ref 8.9–10.3)
Chloride: 100 mmol/L (ref 98–111)
Creatinine, Ser: 8.97 mg/dL — ABNORMAL HIGH (ref 0.61–1.24)
GFR calc Af Amer: 7 mL/min — ABNORMAL LOW (ref >60)
GFR calc non Af Amer: 6 mL/min — ABNORMAL LOW (ref >60)
Glucose, Bld: 171 mg/dL — ABNORMAL HIGH (ref 70–99)
Potassium: 3.6 mmol/L (ref 3.5–5.1)
Sodium: 137 mmol/L (ref 135–145)

## 2019-05-24 LAB — RETICULOCYTES
Immature Retic Fract: 27.4 % — ABNORMAL HIGH (ref 2.3–15.9)
RBC.: 2.15 MIL/uL — ABNORMAL LOW (ref 4.22–5.81)
Retic Count, Absolute: 28.4 10*3/uL (ref 19.0–186.0)
Retic Ct Pct: 1.3 % (ref 0.4–3.1)

## 2019-05-24 LAB — CBC
HCT: 21.3 % — ABNORMAL LOW (ref 39.0–52.0)
Hemoglobin: 6.9 g/dL — ABNORMAL LOW (ref 13.0–17.0)
MCH: 30.9 pg (ref 26.0–34.0)
MCHC: 32.4 g/dL (ref 30.0–36.0)
MCV: 95.5 fL (ref 80.0–100.0)
Platelets: 388 10*3/uL (ref 150–400)
RBC: 2.23 MIL/uL — ABNORMAL LOW (ref 4.22–5.81)
RDW: 13.1 % (ref 11.5–15.5)
WBC: 14 10*3/uL — ABNORMAL HIGH (ref 4.0–10.5)
nRBC: 0 % (ref 0.0–0.2)

## 2019-05-24 LAB — PREPARE RBC (CROSSMATCH)

## 2019-05-24 LAB — FERRITIN: Ferritin: 2643 ng/mL — ABNORMAL HIGH (ref 24–336)

## 2019-05-24 LAB — GLUCOSE, CAPILLARY
Glucose-Capillary: 155 mg/dL — ABNORMAL HIGH (ref 70–99)
Glucose-Capillary: 88 mg/dL (ref 70–99)
Glucose-Capillary: 98 mg/dL (ref 70–99)
Glucose-Capillary: 107 mg/dL — ABNORMAL HIGH (ref 70–99)
Glucose-Capillary: 147 mg/dL — ABNORMAL HIGH (ref 70–99)

## 2019-05-24 LAB — HIV ANTIBODY (ROUTINE TESTING W REFLEX): HIV Screen 4th Generation wRfx: NONREACTIVE

## 2019-05-24 LAB — IRON AND TIBC
Iron: 47 ug/dL (ref 45–182)
Saturation Ratios: 32 % (ref 17.9–39.5)
TIBC: 145 ug/dL — ABNORMAL LOW (ref 250–450)
UIBC: 98 ug/dL

## 2019-05-24 LAB — FOLATE: Folate: 17.6 ng/mL (ref >5.9)

## 2019-05-24 LAB — VITAMIN B12: Vitamin B-12: 498 pg/mL (ref 180–914)

## 2019-05-24 SURGERY — AMPUTATION, TOE
Anesthesia: General | Site: Toe | Laterality: Left

## 2019-05-24 MED ORDER — VANCOMYCIN HCL 1000 MG IV SOLR
INTRAVENOUS | Status: AC
  Filled 2019-05-24: qty 1000

## 2019-05-24 MED ORDER — LIDOCAINE HCL (PF) 1 % IJ SOLN
INTRAMUSCULAR | Status: AC
  Filled 2019-05-24: qty 30

## 2019-05-24 MED ORDER — MIDAZOLAM HCL 2 MG/2ML IJ SOLN
INTRAMUSCULAR | Status: AC
  Filled 2019-05-24: qty 2

## 2019-05-24 MED ORDER — MIDAZOLAM HCL 2 MG/2ML IJ SOLN
INTRAMUSCULAR | Status: DC | PRN
  Administered 2019-05-24 (×2): 1 mg via INTRAVENOUS

## 2019-05-24 MED ORDER — FENTANYL CITRATE (PF) 100 MCG/2ML IJ SOLN: 25.0000 ug | INTRAMUSCULAR | Status: DC | PRN

## 2019-05-24 MED ORDER — PROPOFOL 10 MG/ML IV BOLUS
INTRAVENOUS | Status: AC
  Filled 2019-05-24: qty 40

## 2019-05-24 MED ORDER — NEOMYCIN-POLYMYXIN B GU 40-200000 IR SOLN
Status: AC
  Filled 2019-05-24: qty 1

## 2019-05-24 MED ORDER — BUPIVACAINE HCL (PF) 0.5 % IJ SOLN
INTRAMUSCULAR | Status: AC
  Filled 2019-05-24: qty 30

## 2019-05-24 MED ORDER — LIDOCAINE HCL 1 % IJ SOLN
INTRAMUSCULAR | Status: DC | PRN
  Administered 2019-05-24: 20 mL

## 2019-05-24 MED ORDER — ROCURONIUM BROMIDE 100 MG/10ML IV SOLN
INTRAVENOUS | Status: DC | PRN
  Administered 2019-05-24: 40 mg via INTRAVENOUS

## 2019-05-24 MED ORDER — SUGAMMADEX SODIUM 500 MG/5ML IV SOLN
INTRAVENOUS | Status: DC | PRN
  Administered 2019-05-24: 100 mg via INTRAVENOUS

## 2019-05-24 MED ORDER — FENTANYL CITRATE (PF) 100 MCG/2ML IJ SOLN
INTRAMUSCULAR | Status: AC
  Filled 2019-05-24: qty 2

## 2019-05-24 MED ORDER — FENTANYL CITRATE (PF) 100 MCG/2ML IJ SOLN
INTRAMUSCULAR | Status: DC | PRN
  Administered 2019-05-24: 50 ug via INTRAVENOUS

## 2019-05-24 MED ORDER — SODIUM CHLORIDE 0.9% IV SOLUTION: Freq: Once | INTRAVENOUS | Status: DC

## 2019-05-24 MED ORDER — ONDANSETRON HCL 4 MG/2ML IJ SOLN: 4.0000 mg | Freq: Once | INTRAMUSCULAR | Status: DC | PRN

## 2019-05-24 MED ORDER — PHENYLEPHRINE HCL (PRESSORS) 10 MG/ML IV SOLN
INTRAVENOUS | Status: DC | PRN
  Administered 2019-05-24 (×3): 50 ug via INTRAVENOUS

## 2019-05-24 MED ORDER — PROPOFOL 10 MG/ML IV BOLUS
INTRAVENOUS | Status: DC | PRN
  Administered 2019-05-24: 150 mg via INTRAVENOUS

## 2019-05-24 SURGICAL SUPPLY — 46 items
BLADE MED AGGRESSIVE (BLADE) ×3 IMPLANT
BLADE OSC/SAGITTAL MD 5.5X18 (BLADE) IMPLANT
BLADE SURG 15 STRL LF DISP TIS (BLADE) IMPLANT
BLADE SURG 15 STRL SS (BLADE)
BLADE SURG MINI STRL (BLADE) IMPLANT
BNDG CONFORM 2 STRL LF (GAUZE/BANDAGES/DRESSINGS) IMPLANT
BNDG ELASTIC 4X5.8 VLCR STR LF (GAUZE/BANDAGES/DRESSINGS) ×3 IMPLANT
BNDG ESMARK 4X12 TAN STRL LF (GAUZE/BANDAGES/DRESSINGS) ×3 IMPLANT
BNDG GAUZE 4.5X4.1 6PLY STRL (MISCELLANEOUS) ×3 IMPLANT
CANISTER SUCT 1200ML W/VALVE (MISCELLANEOUS) ×3 IMPLANT
CNTNR SPEC 2.5X3XGRAD LEK (MISCELLANEOUS) ×1
CONT SPEC 4OZ STER OR WHT (MISCELLANEOUS) ×2
CONTAINER SPEC 2.5X3XGRAD LEK (MISCELLANEOUS) ×1 IMPLANT
COVER WAND RF STERILE (DRAPES) ×3 IMPLANT
CUFF TOURN DUAL PL 12 NO SLV (MISCELLANEOUS) ×2 IMPLANT
DRAPE FLUOR MINI C-ARM 54X84 (DRAPES) IMPLANT
DURAPREP 26ML APPLICATOR (WOUND CARE) ×3 IMPLANT
ELECT REM PT RETURN 9FT ADLT (ELECTROSURGICAL) ×3
ELECTRODE REM PT RTRN 9FT ADLT (ELECTROSURGICAL) ×1 IMPLANT
GAUZE SPONGE 4X4 12PLY STRL (GAUZE/BANDAGES/DRESSINGS) ×4 IMPLANT
GAUZE XEROFORM 1X8 LF (GAUZE/BANDAGES/DRESSINGS) ×3 IMPLANT
GLOVE BIO SURGEON STRL SZ7 (GLOVE) ×3 IMPLANT
GLOVE INDICATOR 7.0 STRL GRN (GLOVE) ×3 IMPLANT
GOWN STRL REUS W/ TWL LRG LVL3 (GOWN DISPOSABLE) ×2 IMPLANT
GOWN STRL REUS W/TWL LRG LVL3 (GOWN DISPOSABLE) ×4
HANDPIECE VERSAJET DEBRIDEMENT (MISCELLANEOUS) IMPLANT
KIT TURNOVER KIT A (KITS) ×3 IMPLANT
LABEL OR SOLS (LABEL) ×3 IMPLANT
NDL FILTER BLUNT 18X1 1/2 (NEEDLE) ×1 IMPLANT
NDL HYPO 25X1 1.5 SAFETY (NEEDLE) ×2 IMPLANT
NEEDLE FILTER BLUNT 18X 1/2SAF (NEEDLE) ×2
NEEDLE FILTER BLUNT 18X1 1/2 (NEEDLE) ×1 IMPLANT
NEEDLE HYPO 25X1 1.5 SAFETY (NEEDLE) ×6 IMPLANT
NS IRRIG 500ML POUR BTL (IV SOLUTION) ×3 IMPLANT
PACK EXTREMITY ARMC (MISCELLANEOUS) ×3 IMPLANT
SOL .9 NS 3000ML IRR  AL (IV SOLUTION)
SOL .9 NS 3000ML IRR UROMATIC (IV SOLUTION) IMPLANT
SOL PREP PVP 2OZ (MISCELLANEOUS) ×3
SOLUTION PREP PVP 2OZ (MISCELLANEOUS) ×1 IMPLANT
STOCKINETTE STRL 6IN 960660 (GAUZE/BANDAGES/DRESSINGS) ×3 IMPLANT
SUT ETHILON 3-0 FS-10 30 BLK (SUTURE) ×6
SUT VIC AB 3-0 SH 27 (SUTURE) ×2
SUT VIC AB 3-0 SH 27X BRD (SUTURE) ×1 IMPLANT
SUTURE EHLN 3-0 FS-10 30 BLK (SUTURE) ×2 IMPLANT
SWAB CULTURE AMIES ANAERIB BLU (MISCELLANEOUS) ×2 IMPLANT
SYR 10ML LL (SYRINGE) ×3 IMPLANT

## 2019-05-24 NOTE — Progress Notes (Signed)
[facility] Daily Progress Note   Subjective: 1 Day Post-Op: 1. Introduction catheter into left lower extremity 3rd order catheter placement with additional third order 2. Contrast injection left lower extremity for distal runoff   3. Crosser atherectomy of the left anterior tibial artery, successful 4.  Percutaneous transluminal angioplasty left anterior tibial and dorsalis pedis artery to 2.5 mm             5.  Crosser atherectomy of the left tibioperoneal trunk and peroneal artery not successful                       6.     Percutaneous transluminal angioplasty of the left tibioperoneal trunk and peroneal artery                        7.   Star close closure right common femoral arteriotomy  Patient without complaint. No issues overnight. For surgery today with podiatry.   Objective: Vitals:   05/23/19 1345 05/23/19 1416 05/23/19 2118 05/24/19 0610  BP: (!) 157/78 (!) 150/70 (!) 155/57 (!) 137/55  Pulse: 75 75 76 74  Resp:  19 18 18   Temp:  (!) 97.3 F (36.3 C) 98 F (36.7 C) 98.6 F (37 C)  TempSrc:  Oral Oral Oral  SpO2: 99% 94% 95% 97%  Weight:      Height:        Intake/Output Summary (Last 24 hours) at 05/24/2019 1147 Last data filed at 05/24/2019 0850 Gross per 24 hour  Intake 333.5 ml  Output 1996 ml  Net -1662.5 ml   Physical Exam: A&Ox3, NAD CV: RRR Pulmonary: CTA Bilaterally Abdomen: Soft, Nontender, Nondistended Right Groin: Access site dressing with some drainage but no swelling Vascular:  Left Lower Extremity: Thigh soft, calf soft. Extremity warm distally to toes. Hard to palpate pedal pulses but foot is warm    Laboratory: CBC    Component Value Date/Time   WBC 14.0 (H) 05/24/2019 0541   HGB 6.9 (L) 05/24/2019 0541   HGB 10.7 (L) 12/13/2014 0102   HCT 21.3 (L) 05/24/2019 0541   HCT 33.8 (L) 12/13/2014 0102   PLT 388 05/24/2019 0541   PLT 510 (H) 12/13/2014 0102   BMET     Component Value Date/Time   NA 137 05/24/2019 0541   NA 140 04/16/2015   NA 140 12/13/2014 0102   K 3.6 05/24/2019 0541   K 4.6 12/13/2014 0102   CL 100 05/24/2019 0541   CL 111 12/13/2014 0102   CO2 21 (L) 05/24/2019 0541   CO2 23 12/13/2014 0102   GLUCOSE 171 (H) 05/24/2019 0541   GLUCOSE 213 (H) 12/13/2014 0102   BUN 66 (H) 05/24/2019 0541   BUN 44 (A) 04/16/2015   BUN 25 (H) 12/13/2014 0102   CREATININE 8.97 (H) 05/24/2019 0541   CREATININE 1.95 (H) 12/13/2014 0102   CALCIUM 7.8 (L) 05/24/2019 0541   CALCIUM 8.0 (L) 12/13/2014 0102   GFRNONAA 6 (L) 05/24/2019 0541   GFRNONAA 37 (L) 12/13/2014 0102   GFRAA 7 (L) 05/24/2019 0541   GFRAA 43 (L) 12/13/2014 0102   Assessment/Planning: The patient is a 58 year old male with multiple medical issues including peripheral artery disease with gangrenous changes to the toes and end-stage renal disease on peritoneal dialysis. 1) peripheral artery disease -postop day #1 from successful revascularization of the left lower extremity.  For toe amputation with podiatry today. 2) peritoneal dialysis:  PD catheter seems to be functioning well this time.  Discussed with Dr. Eber Hong Ichael Pullara PA-C 05/24/2019 11:47 AM

## 2019-05-24 NOTE — Progress Notes (Signed)
[place] [facility]                                                                                                                                                                                  Patient Demographics   Scott [name], is a 58 y.o. male, DOB - [birth year], URK:[id number]  Admit date - 05/22/2019   Admitting Physician Demetrios Loll, MD  Outpatient Primary MD for the patient is [facility], [place]   LOS - 2  Subjective: Patient waiting for amputation of his left great toe   Review of Systems:   CONSTITUTIONAL: No documented fever. No fatigue, weakness. No weight gain, no weight loss.  EYES: No blurry or double vision.  ENT: No tinnitus. No postnasal drip. No redness of the oropharynx.  RESPIRATORY: No cough, no wheeze, no hemoptysis. No dyspnea.  CARDIOVASCULAR: No chest pain. No orthopnea. No palpitations. No syncope.  GASTROINTESTINAL: No nausea, no vomiting or diarrhea. No abdominal pain. No melena or hematochezia.  GENITOURINARY: No dysuria or hematuria.  ENDOCRINE: No polyuria or nocturia. No heat or cold intolerance.  HEMATOLOGY: No anemia. No bruising. No bleeding.  INTEGUMENTARY: left great toe with necrosis MUSCULOSKELETAL: No arthritis. No swelling. No gout.  NEUROLOGIC: No numbness, tingling, or ataxia. No seizure-type activity.  Left great toe necrosis PSYCHIATRIC: No anxiety. No insomnia. No ADD.    Vitals:   Vitals:   05/24/19 1415 05/24/19 1430 05/24/19 1445 05/24/19 1500  BP: (!) 126/57 (!) 117/59 (!) 103/47 (!) 121/53  Pulse: 66 65 64 66  Resp: (!) 22 20 (!) 24 17  Temp: 98.4 F (36.9 C)  98.3 F (36.8 C) 98.1 F (36.7 C)  TempSrc: Temporal  Temporal   SpO2: 93% 93% 93% 99%  Weight:      Height:        Wt Readings from Last 3 Encounters:  05/23/19 90.7 kg  09/19/18 101.8 kg  05/03/18 104.3 kg     Intake/Output Summary (Last 24 hours) at 05/24/2019 1538 Last data filed at 05/24/2019 1352 Gross  per 24 hour  Intake 483.5 ml  Output 1998 ml  Net -1514.5 ml    Physical Exam:   GENERAL: Pleasant-appearing in no apparent distress.  HEAD, EYES, EARS, NOSE AND THROAT: Atraumatic, normocephalic. Extraocular muscles are intact. Pupils equal and reactive to light. Sclerae anicteric. No conjunctival injection. No oro-pharyngeal erythema.  NECK: Supple. There is no jugular venous distention. No bruits, no lymphadenopathy, no thyromegaly.  HEART: Regular rate and rhythm,. No murmurs, no rubs, no clicks.  LUNGS: Clear to auscultation bilaterally. No rales or rhonchi. No wheezes.  ABDOMEN: Soft, flat, nontender, nondistended. Has good bowel  sounds. No hepatosplenomegaly appreciated.  EXTREMITIES: No evidence of any cyanosis, clubbing, or peripheral edema.  +2 pedal and radial pulses bilaterally.  NEUROLOGIC: The patient is alert, awake, and oriented x3 with no focal motor or sensory deficits appreciated bilaterally.  SKIN: left great toe necrosis Psych: Not anxious, depressed LN: No inguinal LN enlargement    Antibiotics   Anti-infectives (From admission, onward)   Start     Dose/Rate Route Frequency Ordered Stop   05/24/19 0600  ceFAZolin (ANCEF) IVPB 1 g/50 mL premix    Note to Pharmacy: To be given in specials   1 g 100 mL/hr over 30 Minutes Intravenous  Once 05/23/19 0949 05/23/19 1056   05/23/19 1430  piperacillin-tazobactam (ZOSYN) IVPB 3.375 g     3.375 g 12.5 mL/hr over 240 Minutes Intravenous Every 12 hours 05/23/19 1417     05/23/19 1417  vancomycin variable dose per unstable renal function (pharmacist dosing)      Does not apply See admin instructions 05/23/19 1417     05/22/19 1700  vancomycin (VANCOCIN) 2,000 mg in sodium chloride 0.9 % 500 mL IVPB     2,000 mg 250 mL/hr over 120 Minutes Intravenous  Once 05/22/19 1635 05/22/19 2300   05/22/19 1700  piperacillin-tazobactam (ZOSYN) IVPB 3.375 g     3.375 g 12.5 mL/hr over 240 Minutes Intravenous  Once 05/22/19 1635  05/22/19 2300      Medications   Scheduled Meds: . sodium chloride   Intravenous Once  . amLODipine  10 mg Oral Daily  . atorvastatin  10 mg Oral q1800  . calcitRIOL  0.25 mcg Oral Daily  . calcium acetate  2,001 mg Oral TID WC  . carvedilol  25 mg Oral BID WC  . Chlorhexidine Gluconate Cloth  6 each Topical Daily  . cholecalciferol  2,000 Units Oral Daily  . clopidogrel  75 mg Oral Q breakfast  . furosemide  80 mg Oral Daily  . gentamicin cream  1 application Topical Daily  . heparin  5,000 Units Subcutaneous Q8H  . hydrALAZINE  25 mg Oral Daily  . insulin aspart  0-5 Units Subcutaneous QHS  . insulin aspart  0-9 Units Subcutaneous TID WC  . insulin aspart protamine- aspart  25 Units Subcutaneous BID WC  . multivitamin  1 tablet Oral Daily  . pantoprazole  40 mg Oral Daily  . sodium chloride flush  3 mL Intravenous Q12H  . vancomycin variable dose per unstable renal function (pharmacist dosing)   Does not apply See admin instructions   Continuous Infusions: . sodium chloride 30 mL (05/24/19 0835)  . dialysis solution 2.5% low-MG/low-CA    . piperacillin-tazobactam (ZOSYN)  IV 3.375 g (05/24/19 0836)   PRN Meds:.sodium chloride, [DISCONTINUED] acetaminophen **OR** acetaminophen, acetaminophen, albuterol, bisacodyl, HYDROcodone-acetaminophen, HYDROmorphone (DILAUDID) injection, morphine injection, nitroGLYCERIN, ondansetron (ZOFRAN) IV, ondansetron **OR** [DISCONTINUED] ondansetron (ZOFRAN) IV, oxyCODONE, senna-docusate, sodium chloride flush   Data Review:   Micro Results Recent Results (from the past 240 hour(s))  Blood culture (routine x 2)     Status: None (Preliminary result)   Collection Time: 05/22/19  3:37 PM   Specimen: BLOOD  Result Value Ref Range Status   Specimen Description BLOOD LEFT ANTECUBITAL  Final   Special Requests Blood Culture adequate volume  Final   Culture   Final    NO GROWTH 2 DAYS Performed at [facility], [address],  [place], [place] [address]    Report Status PENDING  Incomplete  Blood culture (  routine x 2)     Status: None (Preliminary result)   Collection Time: 05/22/19  3:42 PM   Specimen: BLOOD  Result Value Ref Range Status   Specimen Description BLOOD BLOOD LEFT FOREARM  Final   Special Requests Blood Culture adequate volume  Final   Culture   Final    NO GROWTH 2 DAYS Performed at [facility], [address], [place], [place] [address]    Report Status PENDING  Incomplete  SARS Coronavirus 2 [facility] order, Performed in [facility] hospital lab) Nasopharyngeal Nasopharyngeal Swab     Status: None   Collection Time: 05/22/19  3:53 PM   Specimen: Nasopharyngeal Swab  Result Value Ref Range Status   SARS Coronavirus 2 NEGATIVE NEGATIVE Final    Comment: (NOTE) If result is NEGATIVE SARS-CoV-2 target nucleic acids are NOT DETECTED. The SARS-CoV-2 RNA is generally detectable in upper and lower  respiratory specimens during the acute phase of infection. The lowest  concentration of SARS-CoV-2 viral copies this assay can detect is 250  copies / mL. A negative result does not preclude SARS-CoV-2 infection  and should not be used as the sole basis for treatment or other  patient management decisions.  A negative result may occur with  improper specimen collection / handling, submission of specimen other  than nasopharyngeal swab, presence of viral mutation(s) within the  areas targeted by this assay, and inadequate number of viral copies  (<250 copies / mL). A negative result must be combined with clinical  observations, patient history, and epidemiological information. If result is POSITIVE SARS-CoV-2 target nucleic acids are DETECTED. The SARS-CoV-2 RNA is generally detectable in upper and lower  respiratory specimens dur ing the acute phase of infection.  Positive  results are indicative of active infection with SARS-CoV-2.  Clinical  correlation with patient history and other  diagnostic information is  necessary to determine patient infection status.  Positive results do  not rule out bacterial infection or co-infection with other viruses. If result is PRESUMPTIVE POSTIVE SARS-CoV-2 nucleic acids MAY BE PRESENT.   A presumptive positive result was obtained on the submitted specimen  and confirmed on repeat testing.  While 2019 novel coronavirus  (SARS-CoV-2) nucleic acids may be present in the submitted sample  additional confirmatory testing may be necessary for epidemiological  and / or clinical management purposes  to differentiate between  SARS-CoV-2 and other Sarbecovirus currently known to infect humans.  If clinically indicated additional testing with an alternate test  methodology [phone]) is advised. The SARS-CoV-2 RNA is generally  detectable in upper and lower respiratory sp ecimens during the acute  phase of infection. The expected result is Negative. Fact Sheet for Patients:  StrictlyIdeas.no Fact Sheet for Healthcare Providers: BankingDealers.co.za This test is not yet approved or cleared by the [place] FDA and has been authorized for detection and/or diagnosis of SARS-CoV-2 by FDA under an Emergency Use Authorization (EUA).  This EUA will remain in effect (meaning this test can be used) for the duration of the COVID-19 declaration under Section 564(b)(1) of the Act, 21 U.S.C. section 360bbb-3(b)(1), unless the authorization is terminated or revoked sooner. Performed at [facility], [address] [place],  [address]   Surgical pcr screen     Status: None   Collection Time: 05/23/19  5:04 PM   Specimen: Nasal Mucosa; Nasal Swab  Result Value Ref Range Status   MRSA, PCR NEGATIVE NEGATIVE Final   Staphylococcus aureus NEGATIVE NEGATIVE Final  Comment: (NOTE) The Xpert SA Assay (FDA approved for NASAL specimens in patients 66 years of age and older), is one  component of a comprehensive surveillance program. It is not intended to diagnose infection nor to guide or monitor treatment. Performed at [facility], [address] [place], [place] [address]     Radiology Reports Dg Foot Complete Left  Result Date: 05/22/2019 CLINICAL DATA:  Left great toe pain and redness EXAM: LEFT FOOT - COMPLETE 3+ VIEW COMPARISON:  None. FINDINGS: Obliquely oriented lucency involving the dorsal cortex of the great toe distal phalanx seen only on lateral view suggestive of a nondisplaced fracture. No definite corresponding abnormality on AP or oblique views. No definite cortical destruction or periostitis appreciated. Mild degenerative changes of the first MTP and IP joints of the great toe. Prominent plantar calcaneal spur. Severe vascular calcifications. Mild soft tissue irregularity along the medial soft tissues at the level of the great toe IP joint. IMPRESSION: 1. Findings suspicious for nondisplaced fracture of the great toe distal phalanx. Consider further evaluation with dedicated radiographs of the digit as clinically indicated. 2. No specific radiographic evidence of acute osteomyelitis. Electronically Signed   By: Davina Poke M.D.   On: 05/22/2019 16:16     CBC Recent Labs  Lab 05/22/19 1511 05/23/19 0524 05/24/19 0541  WBC 23.4* 15.9* 14.0*  HGB 8.1* 7.2* 6.9*  HCT 24.7* 22.0* 21.3*  PLT 398 336 388  MCV 95.4 94.8 95.5  MCH 31.3 31.0 30.9  MCHC 32.8 32.7 32.4  RDW 13.0 12.8 13.1  LYMPHSABS 0.6*  --   --   MONOABS 1.5*  --   --   EOSABS 0.3  --   --   BASOSABS 0.1  --   --     Chemistries  Recent Labs  Lab 05/22/19 1511 05/22/19 1831 05/23/19 0524 05/24/19 0541  NA 137  --  137 137  K 3.4*  --  3.5 3.6  CL 99  --  103 100  CO2 21*  --  20* 21*  GLUCOSE 189*  --  114* 171*  BUN 61*  --  65* 66*  CREATININE 8.72* 8.89* 9.05* 8.97*  CALCIUM 7.4*  --  7.2* 7.8*  AST 30  --   --   --   ALT 34  --   --   --   ALKPHOS  139*  --   --   --   BILITOT 0.5  --   --   --    ------------------------------------------------------------------------------------------------------------------ estimated creatinine clearance is 9.9 mL/min (A) (by C-G formula based on SCr of 8.97 mg/dL (H)). ------------------------------------------------------------------------------------------------------------------ Recent Labs    05/22/19 1831  HGBA1C 6.1*   ------------------------------------------------------------------------------------------------------------------ No results for input(s): CHOL, HDL, LDLCALC, TRIG, CHOLHDL, LDLDIRECT in the last 72 hours. ------------------------------------------------------------------------------------------------------------------ No results for input(s): TSH, T4TOTAL, T3FREE, THYROIDAB in the last 72 hours.  Invalid input(s): FREET3 ------------------------------------------------------------------------------------------------------------------ Recent Labs    05/24/19 1129  FOLATE 17.6  FERRITIN 2,643*  TIBC 145*  IRON 47  RETICCTPCT 1.3    Coagulation profile No results for input(s): INR, PROTIME in the last 168 hours.  No results for input(s): DDIMER in the last 72 hours.  Cardiac Enzymes No results for input(s): CKMB, TROPONINI, MYOGLOBIN in the last 168 hours.  Invalid input(s): CK ------------------------------------------------------------------------------------------------------------------ Invalid input(s): [place]   Patient is a 58 year old with peripheral vascular disease presents with left great toe gangrene  1. Left great toe gangrene infection  with leukocytosis. Continue IV vancomycin and Zosyn. For amputation later today   2. PVD.    Continue Plavix as per vascular surgery t  3. Hypertension.  Continue home hypertension medication. bp stable  4. Diabetes.  continue sliding scale, continue Lantus.  5. ESRD on on pd  continue as per nephrology  6.  Anemia with a drop in hemoglobin of 6.9 I have consented patient for transfusion we will give him 1 unit of packed RBCs    Code Status Orders  (From admission, onward)         Start     Ordered   05/22/19 1831  Full code  Continuous     05/22/19 1830        Code Status History    Date Active Date Inactive Code Status Order ID Comments User Context   09/18/2018 0022 09/19/2018 1608 Full Code [id number]  MayoPete Pelt, MD ED   03/09/2018 1206 03/12/2018 1712 Full Code [id number]  Henreitta Leber, MD Inpatient   01/17/2017 0338 01/20/2017 1834 Full Code [id number]  Harrie Foreman, MD ED   11/13/2016 1013 11/20/2016 1256 Full Code [id number]  Hillary Bow, MD ED   01/09/2015 1019 01/10/2015 1518 Full Code [id number]  Max Sane, MD Inpatient   12/18/2014 1620 12/19/2014 1627 Full Code [id number]  Charolette Forward, MD Inpatient   12/14/2014 1600 12/18/2014 1620 Full Code [id number]  Charolette Forward, MD Inpatient   12/13/2014 1324 12/14/2014 1600 Full Code [id number]  Charolette Forward, MD Inpatient   Advance Care Planning Activity           Consults vascular, podiatry   DVT Prophylaxis  Lovenox    Lab Results  Component Value Date   PLT 388 05/24/2019     Time Spent in minutes   56min  Greater than 50% of time spent in care coordination and counseling patient regarding the condition and plan of care.   Dustin Flock M.D on 05/24/2019 at 3:38 PM  Between 7am to 6pm - Pager - [phone]  After 6pm go to [email or web address] - Proofreader  [employer]   Office  [phone]

## 2019-05-24 NOTE — Anesthesia Preprocedure Evaluation (Signed)
Anesthesia Evaluation  Patient identified by MRN, date of birth, ID band Patient awake    Reviewed: Allergy & Precautions, NPO status , Patient's Chart, lab work & pertinent test results  History of Anesthesia Complications Negative for: history of anesthetic complications  Airway Mallampati: II  TM Distance: >3 FB Neck ROM: Full    Dental  (+) Edentulous Upper, Edentulous Lower   Pulmonary asthma , Patient abstained from smoking., former smoker,    breath sounds clear to auscultation- rhonchi (-) wheezing      Cardiovascular hypertension, + CAD, + Past MI, + Cardiac Stents, + Peripheral Vascular Disease and +CHF   Rhythm:Regular Rate:Normal - Systolic murmurs and - Diastolic murmurs    Neuro/Psych neg Seizures PSYCHIATRIC DISORDERS Depression negative neurological ROS     GI/Hepatic GERD  ,  Endo/Other  diabetes, Insulin Dependent  Renal/GU ESRF and DialysisRenal disease (PD)     Musculoskeletal  (+) Arthritis ,   Abdominal (+) + obese,   Peds  Hematology  (+) anemia ,   Anesthesia Other Findings Past Medical History: No date: Arthritis     Comment:  "left arm; right leg" (12/13/2014) No date: Asthma No date: CHF (congestive heart failure) (HCC) No date: Chronic disease anemia     Comment:  [name] 12/13/2014 No date: Chronic kidney disease (CKD), stage IV (severe) ([place])     Comment:  [name] 12/13/2014... on dialysis No date: Complication of anesthesia     Comment:  unable to urinate after CAPD urgery No date: Continuous ambulatory peritoneal dialysis status (HCC) No date: Coronary artery disease     Comment:  /notes 12/13/2014 No date: Depression No date: Dysrhythmia     Comment:  patient unaware of irregular heartbeat No date: GERD (gastroesophageal reflux disease) No date: High cholesterol     Comment:  [name] 12/13/2014 No date: Hypertension No date: Non-Q wave myocardial infarction [facility])     Comment:   [name] 12/13/2014 No date: PVD (peripheral vascular disease) ([place])     Comment:  [name] 12/13/2014 No date: Type II diabetes mellitus (HCC)   Reproductive/Obstetrics                             Anesthesia Physical Anesthesia Plan  ASA: IV  Anesthesia Plan: General   Post-op Pain Management:    Induction: Intravenous  PONV Risk Score and Plan: 1 and Ondansetron  Airway Management Planned: Oral ETT  Additional Equipment:   Intra-op Plan:   Post-operative Plan: Extubation in OR  Informed Consent: I have reviewed the patients History and Physical, chart, labs and discussed the procedure including the risks, benefits and alternatives for the proposed anesthesia with the patient or authorized representative who has indicated his/her understanding and acceptance.     Dental advisory given  Plan Discussed with: CRNA and Anesthesiologist  Anesthesia Plan Comments:         Anesthesia Quick Evaluation

## 2019-05-24 NOTE — Progress Notes (Signed)
   05/24/19 2030  Neurological  Level of Consciousness Alert  Orientation Level Oriented X4  Respiratory  Respiratory Pattern Regular;Unlabored  Cardiac  Pulse Regular  Peritoneal Catheter Left lower abdomen Continuous ambulatory  Placement Date/Time: 04/07/17 1328   Procedural Verification: Medical records & consent reviewed  Time out: Correct Patient;Correct Site;Correct Procedure;Special equipment/requirements available  Person Inserting Catheter: Dr. [name]  Catheter Locat...  Site Assessment Clean;Dry;Intact  Drainage Description None  Catheter status Clamped  Dressing Gauze/Drain sponge  Dressing Status Clean;Dry  Dressing Intervention Dressing changed  Psychosocial  Psychosocial (WDL) WDL  pt awakened easily with no c/os states he is just tired from surgery today denies pain. PD cath [facility] drsg changed per protocol set up for exchange. Instructed to inform care team of any issues or changes.

## 2019-05-24 NOTE — Anesthesia Post-op Follow-up Note (Signed)
Anesthesia QCDR form completed.        

## 2019-05-24 NOTE — Anesthesia Procedure Notes (Signed)
Procedure Name: Intubation Date/Time: 05/24/2019 1:10 PM Performed by: Gentry Fitz, CRNA Pre-anesthesia Checklist: Patient identified, Emergency Drugs available, Suction available and Patient being monitored Patient Re-evaluated:Patient Re-evaluated prior to induction Oxygen Delivery Method: Circle system utilized Preoxygenation: Pre-oxygenation with 100% oxygen Induction Type: IV induction Ventilation: Mask ventilation without difficulty Laryngoscope Size: Mac and 4 Grade View: Grade III Tube type: Oral Tube size: 7.5 mm Number of attempts: 1 Airway Equipment and Method: Stylet Placement Confirmation: positive ETCO2 and breath sounds checked- equal and bilateral Secured at: 23 cm Tube secured with: Tape Dental Injury: Teeth and Oropharynx as per pre-operative assessment

## 2019-05-24 NOTE — Progress Notes (Signed)
Pd completed 

## 2019-05-24 NOTE — H&P (Signed)
HISTORY AND PHYSICAL INTERVAL NOTE:  05/24/2019  12:35 PM  Scott [name]  has presented today for surgery, with the diagnosis of gangrene left hallux with cellulitis.  The various methods of treatment have been discussed with the patient.  No guarantees were given.  After consideration of risks, benefits and other options for treatment, the patient has consented to surgery.  I have reviewed the patients' chart and labs.    PROCEDURE: Left partial 1st ray amputation   A history and physical examination was performed in my office.  The patient was reexamined.  There have been no changes to this history and physical examination.  Caroline More

## 2019-05-24 NOTE — Transfer of Care (Signed)
Immediate Anesthesia Transfer of Care Note  Patient: Scott [name]  Procedure(s) Performed: Left great toe amputation (Left Toe)  Patient Location: PACU  Anesthesia Type:General  Level of Consciousness: drowsy  Airway & Oxygen Therapy: Patient Spontanous Breathing and Patient connected to face mask  Post-op Assessment: Report given to RN and Post -op Vital signs reviewed and stable  Post vital signs: Reviewed and stable  Last Vitals:  Vitals Value Taken Time  BP 124/52 05/24/19 1400  Temp 36.5 C 05/24/19 1400  Pulse 67 05/24/19 1405  Resp 20 05/24/19 1405  SpO2 89 % 05/24/19 1405  Vitals shown include unvalidated device data.  Last Pain:  Vitals:   05/24/19 1400  TempSrc:   PainSc: Asleep         Complications: No apparent anesthesia complications

## 2019-05-24 NOTE — Op Note (Signed)
PODIATRY / FOOT AND ANKLE SURGERY OPERATIVE REPORT    SURGEON: Caroline More, DPM  PRE-OPERATIVE DIAGNOSIS:  1.  Gangrene left hallux/first metatarsal, dry 2.  Osteomyelitis left hallux, chronic 3.  Abscess left hallux/first metatarsal phalangeal joint 4.  Cellulitis left foot 5.  PVD bilateral lower extremity 6.  Diabetes type 2 polyneuropathy 7.  Right plantar third metatarsal phalangeal joint ulceration with fat layer exposed  POST-OPERATIVE DIAGNOSIS: Same  PROCEDURE(S): 1. Left partial first ray amputation  HEMOSTASIS: No tourniquet  ANESTHESIA: MAC with local anesthesia  ESTIMATED BLOOD LOSS: 10 cc  FINDING(S): 1.  Dry gangrene left hallux 2.  Abscess apparent in the first metatarsal phalangeal joint left 3.  Osteomyelitis left hallux  PATHOLOGY/SPECIMEN(S): Abscess/wound culture left hallux, pathologic specimen left hallux/first metatarsal with proximal margin marked.  Bone appeared to be healthy and viable at the level of the proximal margin of the first metatarsal.  INDICATIONS:   Scott [name] is a 58 y.o. male who presents with chronic gangrene to the left first metatarsal phalangeal joint and hallux.  Patient was admitted to the hospital for further work-up due to gangrene and cellulitic changes that started at the level of the forefoot.  Patient was seen by vascular surgery and had revascularization attempt performed with mild success.  Patient is now optimized for surgery for this amputation today consisting of partial left first ray amputation.  DESCRIPTION: After obtaining full informed written consent, the patient was brought back to the operating room and placed supine upon the operating table.  The patient received IV antibiotics prior to induction.  After obtaining adequate anesthesia a first ray block was performed with 20 cc of 1% lidocaine plain.  The patient was prepped and draped in the standard fashion.  No tourniquet was applied or inflated during the  procedure  Attention was directed to the left first metatarsal phalangeal joint where a racquet type of incision was made straight to bone.  The incision was started over the dorsal medial aspect of the first metatarsal at the midshaft and extended to the base of the proximal phalanx of the hallux where the racquet was completed around the area of the base of the hallux.  After the incision was made a large amount of purulence was able to be expressed from the medial aspect of the first metatarsal phalangeal joint and base of the proximal phalanx of the hallux.  A wound culture was taken from this area and passed off from the surgical site.  An extensor tenotomy and capsulotomy was then performed at the level of the first metatarsal phalangeal joint followed by release the collateral and suspensory ligaments and any attachments to the plantar plate and flexor tendon.  The hallux was disarticulated and passed off in the operative site.  Circumferential dissection was then performed around the first metatarsal to the level of the midshaft.  The bone appeared to be healthy and viable at the level of the midshaft so at this time a sagittal bone saw was used to resect the distal shaft and head of the first metatarsal with the appropriate beveling.  The first metatarsal head was passed off in the operative site along with the distal shaft/midshaft and the proximal margin was marked with black/purple ink.  The hallux and the first metatarsal head and distal shaft are passed off in the operative site and sent off for pathology.  The bone that remains in the foot at the first metatarsal appears to be healthy and viable with  no evidence of osteomyelitis present.  The surgical site was then flushed with copious amounts normal sterile saline.  Subcutaneous tissues were then reapproximated well coapted with 3-0 Vicryl and the skin was then reapproximated well coapted with 3-0 nylon in a combination of simple and horizontal  mattress type stitching.  Due to the amount of tissue loss from the amount of gangrene present it was difficult to obtain closure without tension at the level of the second MPJ medially to the tissues of the plantar medial flap.  Full closure was able to be obtained but with a moderate amount of tension present.  Throughout the entirety of the case there appeared to be minimal blood loss indicating blood flow may not be substantial enough to heal this type of amputation or any amputation of the foot.  Xeroform was then used to cover the incision site followed by 4 x 4 gauze, Kerlix, Ace wrap.  The patient tolerated the procedure and anesthesia well transferred to recovery room with vital signs stable and vascular status remaining questionable to the left lower extremity but unchanged from prior to the procedure.  Following a period of postoperative monitoring the patient will be discharged to the inpatient room with the following written and oral postop instructions: Keep surgical dressings clean, dry, and intact, try to stay off of the left foot is much as possible and whenever putting weight on the foot apply pressure to the heel only in surgical shoe, take antibiotics and pain medication as prescribed if prescribed.  We will continue to follow-up with patient till discharge.  COMPLICATIONS: None  CONDITION: Stable, good  Caroline More

## 2019-05-24 NOTE — Progress Notes (Signed)
[facility]  ROUNDING NOTE   Subjective:  Patient tolerated peritoneal dialysis well overnight. Due for amputation of left first toe today. Slept well overnight.   Objective:  Vital signs in last 24 hours:  Temp:  [97.3 F (36.3 C)-98.6 F (37 C)] 98.6 F (37 C) (09/23 0610) Pulse Rate:  [74-83] 74 (09/23 0610) Resp:  [16-22] 18 (09/23 0610) BP: (136-158)/(55-78) 137/55 (09/23 0610) SpO2:  [94 %-100 %] 97 % (09/23 0610)  Weight change: -0.019 kg Filed Weights   05/22/19 1509 05/23/19 1000  Weight: 90.7 kg 90.7 kg    Intake/Output: I/O last 3 completed shifts: In: 1105.3 [P.O.:480; I.V.:10.3; IV Piggyback:615] Out: 300 [Urine:300]   Intake/Output this shift:  Total I/O In: -  Out: 1996 [Other:1996]  Physical Exam: General: No acute distress  Head: Normocephalic, atraumatic. Moist oral mucosal membranes  Eyes: Anicteric  Neck: Supple, trachea midline  Lungs:  Clear to auscultation, normal effort  Heart: S1S2 no rubs  Abdomen:  Soft, nontender, bowel sounds present  Extremities: 1+ peripheral edema.  Neurologic: Awake, alert, following commands  Skin: No lesions  Access: PD catheter in place    Basic Metabolic Panel: Recent Labs  Lab 05/22/19 1511 05/22/19 1831 05/23/19 0524 05/24/19 0541  NA 137  --  137 137  K 3.4*  --  3.5 3.6  CL 99  --  103 100  CO2 21*  --  20* 21*  GLUCOSE 189*  --  114* 171*  BUN 61*  --  65* 66*  CREATININE 8.72* 8.89* 9.05* 8.97*  CALCIUM 7.4*  --  7.2* 7.8*    Liver Function Tests: Recent Labs  Lab 05/22/19 1511  AST 30  ALT 34  ALKPHOS 139*  BILITOT 0.5  PROT 6.8  ALBUMIN 2.5*   No results for input(s): LIPASE, AMYLASE in the last 168 hours. No results for input(s): AMMONIA in the last 168 hours.  CBC: Recent Labs  Lab 05/22/19 1511 05/23/19 0524 05/24/19 0541  WBC 23.4* 15.9* 14.0*  NEUTROABS 20.5*  --   --   HGB 8.1* 7.2* 6.9*  HCT 24.7* 22.0* 21.3*  MCV 95.4 94.8 95.5  PLT 398 336  388    Cardiac Enzymes: No results for input(s): CKTOTAL, CKMB, CKMBINDEX, TROPONINI in the last 168 hours.  BNP: Invalid input(s): POCBNP  CBG: Recent Labs  Lab 05/23/19 1009 05/23/19 1318 05/23/19 1654 05/23/19 2100 05/24/19 0749  GLUCAP 129* 149* 144* 79 155*    Microbiology: Results for orders placed or performed during the hospital encounter of 05/22/19  Blood culture (routine x 2)     Status: None (Preliminary result)   Collection Time: 05/22/19  3:37 PM   Specimen: BLOOD  Result Value Ref Range Status   Specimen Description BLOOD LEFT ANTECUBITAL  Final   Special Requests Blood Culture adequate volume  Final   Culture   Final    NO GROWTH 2 DAYS Performed at [facility], [address] [place], [place] [address]    Report Status PENDING  Incomplete  Blood culture (routine x 2)     Status: None (Preliminary result)   Collection Time: 05/22/19  3:42 PM   Specimen: BLOOD  Result Value Ref Range Status   Specimen Description BLOOD BLOOD LEFT FOREARM  Final   Special Requests Blood Culture adequate volume  Final   Culture   Final    NO GROWTH 2 DAYS Performed at [facility], [address], [place], [place] [address]  Report Status PENDING  Incomplete  SARS Coronavirus 2 [facility] order, Performed in [facility] hospital lab) Nasopharyngeal Nasopharyngeal Swab     Status: None   Collection Time: 05/22/19  3:53 PM   Specimen: Nasopharyngeal Swab  Result Value Ref Range Status   SARS Coronavirus 2 NEGATIVE NEGATIVE Final    Comment: (NOTE) If result is NEGATIVE SARS-CoV-2 target nucleic acids are NOT DETECTED. The SARS-CoV-2 RNA is generally detectable in upper and lower  respiratory specimens during the acute phase of infection. The lowest  concentration of SARS-CoV-2 viral copies this assay can detect is 250  copies / mL. A negative result does not preclude SARS-CoV-2 infection  and should not be used as the sole basis for treatment  or other  patient management decisions.  A negative result may occur with  improper specimen collection / handling, submission of specimen other  than nasopharyngeal swab, presence of viral mutation(s) within the  areas targeted by this assay, and inadequate number of viral copies  (<250 copies / mL). A negative result must be combined with clinical  observations, patient history, and epidemiological information. If result is POSITIVE SARS-CoV-2 target nucleic acids are DETECTED. The SARS-CoV-2 RNA is generally detectable in upper and lower  respiratory specimens dur ing the acute phase of infection.  Positive  results are indicative of active infection with SARS-CoV-2.  Clinical  correlation with patient history and other diagnostic information is  necessary to determine patient infection status.  Positive results do  not rule out bacterial infection or co-infection with other viruses. If result is PRESUMPTIVE POSTIVE SARS-CoV-2 nucleic acids MAY BE PRESENT.   A presumptive positive result was obtained on the submitted specimen  and confirmed on repeat testing.  While 2019 novel coronavirus  (SARS-CoV-2) nucleic acids may be present in the submitted sample  additional confirmatory testing may be necessary for epidemiological  and / or clinical management purposes  to differentiate between  SARS-CoV-2 and other Sarbecovirus currently known to infect humans.  If clinically indicated additional testing with an alternate test  methodology [phone]) is advised. The SARS-CoV-2 RNA is generally  detectable in upper and lower respiratory sp ecimens during the acute  phase of infection. The expected result is Negative. Fact Sheet for Patients:  StrictlyIdeas.no Fact Sheet for Healthcare Providers: BankingDealers.co.za This test is not yet approved or cleared by the [place] FDA and has been authorized for detection and/or diagnosis of  SARS-CoV-2 by FDA under an Emergency Use Authorization (EUA).  This EUA will remain in effect (meaning this test can be used) for the duration of the COVID-19 declaration under Section 564(b)(1) of the Act, 21 U.S.C. section 360bbb-3(b)(1), unless the authorization is terminated or revoked sooner. Performed at [facility], [address], [place], [place] [address]   Surgical pcr screen     Status: None   Collection Time: 05/23/19  5:04 PM   Specimen: Nasal Mucosa; Nasal Swab  Result Value Ref Range Status   MRSA, PCR NEGATIVE NEGATIVE Final   Staphylococcus aureus NEGATIVE NEGATIVE Final    Comment: (NOTE) The Xpert SA Assay (FDA approved for NASAL specimens in patients 32 years of age and older), is one component of a comprehensive surveillance program. It is not intended to diagnose infection nor to guide or monitor treatment. Performed at [facility], [address] [place], [place] [address]     Coagulation Studies: No results for input(s): LABPROT, INR in the last 72 hours.  Urinalysis: No results for  input(s): COLORURINE, LABSPEC, PHURINE, GLUCOSEU, HGBUR, BILIRUBINUR, KETONESUR, PROTEINUR, UROBILINOGEN, NITRITE, LEUKOCYTESUR in the last 72 hours.  Invalid input(s): APPERANCEUR    Imaging: Dg Foot Complete Left  Result Date: 05/22/2019 CLINICAL DATA:  Left great toe pain and redness EXAM: LEFT FOOT - COMPLETE 3+ VIEW COMPARISON:  None. FINDINGS: Obliquely oriented lucency involving the dorsal cortex of the great toe distal phalanx seen only on lateral view suggestive of a nondisplaced fracture. No definite corresponding abnormality on AP or oblique views. No definite cortical destruction or periostitis appreciated. Mild degenerative changes of the first MTP and IP joints of the great toe. Prominent plantar calcaneal spur. Severe vascular calcifications. Mild soft tissue irregularity along the medial soft tissues at the level of the great toe IP  joint. IMPRESSION: 1. Findings suspicious for nondisplaced fracture of the great toe distal phalanx. Consider further evaluation with dedicated radiographs of the digit as clinically indicated. 2. No specific radiographic evidence of acute osteomyelitis. Electronically Signed   By: Davina Poke M.D.   On: 05/22/2019 16:16     Medications:   . sodium chloride 30 mL (05/24/19 0835)  . dialysis solution 2.5% low-MG/low-CA    . piperacillin-tazobactam (ZOSYN)  IV 3.375 g (05/24/19 0836)   . sodium chloride   Intravenous Once  . amLODipine  10 mg Oral Daily  . atorvastatin  10 mg Oral q1800  . calcitRIOL  0.25 mcg Oral Daily  . calcium acetate  2,001 mg Oral TID WC  . carvedilol  25 mg Oral BID WC  . Chlorhexidine Gluconate Cloth  6 each Topical Daily  . cholecalciferol  2,000 Units Oral Daily  . clopidogrel  75 mg Oral Q breakfast  . feeding supplement  296 mL Oral Once  . furosemide  80 mg Oral Daily  . gentamicin cream  1 application Topical Daily  . heparin  5,000 Units Subcutaneous Q8H  . hydrALAZINE  25 mg Oral Daily  . insulin aspart  0-5 Units Subcutaneous QHS  . insulin aspart  0-9 Units Subcutaneous TID WC  . insulin aspart protamine- aspart  25 Units Subcutaneous BID WC  . multivitamin  1 tablet Oral Daily  . pantoprazole  40 mg Oral Daily  . sodium chloride flush  3 mL Intravenous Q12H  . vancomycin variable dose per unstable renal function (pharmacist dosing)   Does not apply See admin instructions   sodium chloride, [DISCONTINUED] acetaminophen **OR** acetaminophen, acetaminophen, albuterol, bisacodyl, HYDROcodone-acetaminophen, HYDROmorphone (DILAUDID) injection, morphine injection, nitroGLYCERIN, ondansetron (ZOFRAN) IV, ondansetron **OR** [DISCONTINUED] ondansetron (ZOFRAN) IV, oxyCODONE, senna-docusate, sodium chloride flush  Assessment/ Plan:  58 y.o. male with end stage renal disease on peritoneal dialysis, congestive heart failure, GERD, hypertension, peripheral  vascular disease, diabetes mellitus type 2, diabetic retinopathy, diabetic gastroparesis, admitted with gangrene of left first toe.  [facility]  CAPD 1840mL fills 4 exchanges   1.  ESRD on PD. 2.  Gangrene of left first toe. 3.  Anemia of chronic kidney disease. 4.  Secondary hyperparathyroidism. 5.  Hypertension.  Plan: Patient tolerating CCPD well.  We will continue with CCPD tonight as well.  No change in PD prescription at this time.  Continue to use 2.5% dextrose solution.  Patient due for amputation of left first toe today by vascular surgery.  Patient to be maintained on calcium acetate for treatment of secondary hyperparathyroidism.   LOS: 2 Ednah Hammock 9/23/202011:27 AM

## 2019-05-25 ENCOUNTER — Encounter: Payer: Self-pay | Admitting: Podiatry

## 2019-05-25 LAB — BPAM RBC
Blood Product Expiration Date: 202009242359
ISSUE DATE / TIME: 202009231415
Unit Type and Rh: 9500

## 2019-05-25 LAB — RENAL FUNCTION PANEL
Albumin: 2.2 g/dL — ABNORMAL LOW (ref 3.5–5.0)
Anion gap: 19 — ABNORMAL HIGH (ref 5–15)
BUN: 63 mg/dL — ABNORMAL HIGH (ref 6–20)
CO2: 21 mmol/L — ABNORMAL LOW (ref 22–32)
Calcium: 7.7 mg/dL — ABNORMAL LOW (ref 8.9–10.3)
Chloride: 97 mmol/L — ABNORMAL LOW (ref 98–111)
Creatinine, Ser: 8.99 mg/dL — ABNORMAL HIGH (ref 0.61–1.24)
GFR calc Af Amer: 7 mL/min — ABNORMAL LOW (ref >60)
GFR calc non Af Amer: 6 mL/min — ABNORMAL LOW (ref >60)
Glucose, Bld: 190 mg/dL — ABNORMAL HIGH (ref 70–99)
Phosphorus: 8.7 mg/dL — ABNORMAL HIGH (ref 2.5–4.6)
Potassium: 3.5 mmol/L (ref 3.5–5.1)
Sodium: 137 mmol/L (ref 135–145)

## 2019-05-25 LAB — GLUCOSE, CAPILLARY
Glucose-Capillary: 117 mg/dL — ABNORMAL HIGH (ref 70–99)
Glucose-Capillary: 55 mg/dL — ABNORMAL LOW (ref 70–99)
Glucose-Capillary: 182 mg/dL — ABNORMAL HIGH (ref 70–99)
Glucose-Capillary: 53 mg/dL — ABNORMAL LOW (ref 70–99)
Glucose-Capillary: 104 mg/dL — ABNORMAL HIGH (ref 70–99)
Glucose-Capillary: 221 mg/dL — ABNORMAL HIGH (ref 70–99)

## 2019-05-25 LAB — TYPE AND SCREEN
ABO/RH(D): O POS
Antibody Screen: NEGATIVE
Unit division: 0

## 2019-05-25 LAB — CBC
HCT: 23.4 % — ABNORMAL LOW (ref 39.0–52.0)
Hemoglobin: 7.5 g/dL — ABNORMAL LOW (ref 13.0–17.0)
MCH: 30.7 pg (ref 26.0–34.0)
MCHC: 32.1 g/dL (ref 30.0–36.0)
MCV: 95.9 fL (ref 80.0–100.0)
Platelets: 401 10*3/uL — ABNORMAL HIGH (ref 150–400)
RBC: 2.44 MIL/uL — ABNORMAL LOW (ref 4.22–5.81)
RDW: 13.7 % (ref 11.5–15.5)
WBC: 13.9 10*3/uL — ABNORMAL HIGH (ref 4.0–10.5)
nRBC: 0 % (ref 0.0–0.2)

## 2019-05-25 LAB — VANCOMYCIN, RANDOM: Vancomycin Rm: 17

## 2019-05-25 MED ORDER — INSULIN ASPART PROT & ASPART (70-30 MIX) 100 UNIT/ML ~~LOC~~ SUSP
12.0000 [IU] | Freq: Two times a day (BID) | SUBCUTANEOUS | Status: DC
  Administered 2019-05-26: 12 [IU] via SUBCUTANEOUS
  Filled 2019-05-25: qty 10

## 2019-05-25 MED ORDER — AMOXICILLIN-POT CLAVULANATE 500-125 MG PO TABS
1.0000 | ORAL_TABLET | Freq: Two times a day (BID) | ORAL | Status: DC
  Administered 2019-05-25 – 2019-05-26 (×3): 500 mg via ORAL
  Filled 2019-05-25 (×4): qty 1

## 2019-05-25 MED ORDER — VANCOMYCIN HCL IN DEXTROSE 1-5 GM/200ML-% IV SOLN
1000.0000 mg | Freq: Once | INTRAVENOUS | Status: AC
  Administered 2019-05-25: 09:00:00 1000 mg via INTRAVENOUS
  Filled 2019-05-25: qty 200

## 2019-05-25 MED ORDER — DEXTROSE 50 % IV SOLN
25.0000 g | INTRAVENOUS | Status: AC
  Administered 2019-05-25: 25 g via INTRAVENOUS

## 2019-05-25 MED ORDER — DEXTROSE 50 % IV SOLN
INTRAVENOUS | Status: AC
  Filled 2019-05-25: qty 50

## 2019-05-25 NOTE — Progress Notes (Signed)
[facility]  ROUNDING NOTE   Subjective:  Tolerating peritoneal dialysis well. Ultrafiltration achieved was 885 cc over the night. Amputation of left first toe was successful.    Objective:  Vital signs in last 24 hours:  Temp:  [97.6 F (36.4 C)-98.6 F (37 C)] 98.6 F (37 C) (09/24 1324) Pulse Rate:  [61-75] 68 (09/24 1324) Resp:  [16-20] 20 (09/24 0400) BP: (91-130)/(43-71) 91/50 (09/24 1324) SpO2:  [90 %-99 %] 98 % (09/24 1324)  Weight change:  Filed Weights   05/22/19 1509 05/23/19 1000  Weight: 90.7 kg 90.7 kg    Intake/Output: I/O last 3 completed shifts: In: 778.1 [P.O.:120; I.V.:160.2; Blood:320; IV Piggyback:177.9] Out: 2856 [JOINO:6767; Blood:2]   Intake/Output this shift:  Total I/O In: 693.8 [P.O.:480; IV Piggyback:213.8] Out: 100 [Urine:100]  Physical Exam: General: No acute distress  Head: Normocephalic, atraumatic. Moist oral mucosal membranes  Eyes: Anicteric  Neck: Supple, trachea midline  Lungs:  Clear to auscultation, normal effort  Heart: S1S2 no rubs  Abdomen:  Soft, nontender, bowel sounds present  Extremities: 1+ peripheral edema.  Neurologic: Awake, alert, following commands  Skin: No lesions  Access: PD catheter in place    Basic Metabolic Panel: Recent Labs  Lab 05/22/19 1511 05/22/19 1831 05/23/19 0524 05/24/19 0541 05/25/19 0459  NA 137  --  137 137 137  K 3.4*  --  3.5 3.6 3.5  CL 99  --  103 100 97*  CO2 21*  --  20* 21* 21*  GLUCOSE 189*  --  114* 171* 190*  BUN 61*  --  65* 66* 63*  CREATININE 8.72* 8.89* 9.05* 8.97* 8.99*  CALCIUM 7.4*  --  7.2* 7.8* 7.7*  PHOS  --   --   --   --  8.7*    Liver Function Tests: Recent Labs  Lab 05/22/19 1511 05/25/19 0459  AST 30  --   ALT 34  --   ALKPHOS 139*  --   BILITOT 0.5  --   PROT 6.8  --   ALBUMIN 2.5* 2.2*   No results for input(s): LIPASE, AMYLASE in the last 168 hours. No results for input(s): AMMONIA in the last 168 hours.  CBC: Recent  Labs  Lab 05/22/19 1511 05/23/19 0524 05/24/19 0541 05/25/19 0459  WBC 23.4* 15.9* 14.0* 13.9*  NEUTROABS 20.5*  --   --   --   HGB 8.1* 7.2* 6.9* 7.5*  HCT 24.7* 22.0* 21.3* 23.4*  MCV 95.4 94.8 95.5 95.9  PLT 398 336 388 401*    Cardiac Enzymes: No results for input(s): CKTOTAL, CKMB, CKMBINDEX, TROPONINI in the last 168 hours.  BNP: Invalid input(s): POCBNP  CBG: Recent Labs  Lab 05/24/19 2144 05/25/19 0756 05/25/19 1206 05/25/19 1233 05/25/19 1304  GLUCAP 147* 117* 55* 53* 182*    Microbiology: Results for orders placed or performed during the hospital encounter of 05/22/19  Blood culture (routine x 2)     Status: None (Preliminary result)   Collection Time: 05/22/19  3:37 PM   Specimen: BLOOD  Result Value Ref Range Status   Specimen Description BLOOD LEFT ANTECUBITAL  Final   Special Requests Blood Culture adequate volume  Final   Culture   Final    NO GROWTH 3 DAYS Performed at [facility], [address], [place], [place] [address]    Report Status PENDING  Incomplete  Blood culture (routine x 2)     Status: None (Preliminary result)   Collection Time: 05/22/19  3:42 PM   Specimen: BLOOD  Result Value Ref Range Status   Specimen Description BLOOD BLOOD LEFT FOREARM  Final   Special Requests Blood Culture adequate volume  Final   Culture   Final    NO GROWTH 3 DAYS Performed at [facility], [address] [place], [place] [address]    Report Status PENDING  Incomplete  SARS Coronavirus 2 [facility] order, Performed in [facility] hospital lab) Nasopharyngeal Nasopharyngeal Swab     Status: None   Collection Time: 05/22/19  3:53 PM   Specimen: Nasopharyngeal Swab  Result Value Ref Range Status   SARS Coronavirus 2 NEGATIVE NEGATIVE Final    Comment: (NOTE) If result is NEGATIVE SARS-CoV-2 target nucleic acids are NOT DETECTED. The SARS-CoV-2 RNA is generally detectable in upper and lower  respiratory specimens during the  acute phase of infection. The lowest  concentration of SARS-CoV-2 viral copies this assay can detect is 250  copies / mL. A negative result does not preclude SARS-CoV-2 infection  and should not be used as the sole basis for treatment or other  patient management decisions.  A negative result may occur with  improper specimen collection / handling, submission of specimen other  than nasopharyngeal swab, presence of viral mutation(s) within the  areas targeted by this assay, and inadequate number of viral copies  (<250 copies / mL). A negative result must be combined with clinical  observations, patient history, and epidemiological information. If result is POSITIVE SARS-CoV-2 target nucleic acids are DETECTED. The SARS-CoV-2 RNA is generally detectable in upper and lower  respiratory specimens dur ing the acute phase of infection.  Positive  results are indicative of active infection with SARS-CoV-2.  Clinical  correlation with patient history and other diagnostic information is  necessary to determine patient infection status.  Positive results do  not rule out bacterial infection or co-infection with other viruses. If result is PRESUMPTIVE POSTIVE SARS-CoV-2 nucleic acids MAY BE PRESENT.   A presumptive positive result was obtained on the submitted specimen  and confirmed on repeat testing.  While 2019 novel coronavirus  (SARS-CoV-2) nucleic acids may be present in the submitted sample  additional confirmatory testing may be necessary for epidemiological  and / or clinical management purposes  to differentiate between  SARS-CoV-2 and other Sarbecovirus currently known to infect humans.  If clinically indicated additional testing with an alternate test  methodology [id number]) is advised. The SARS-CoV-2 RNA is generally  detectable in upper and lower respiratory sp ecimens during the acute  phase of infection. The expected result is Negative. Fact Sheet for Patients:   StrictlyIdeas.no Fact Sheet for Healthcare Providers: BankingDealers.co.za This test is not yet approved or cleared by the [place] FDA and has been authorized for detection and/or diagnosis of SARS-CoV-2 by FDA under an Emergency Use Authorization (EUA).  This EUA will remain in effect (meaning this test can be used) for the duration of the COVID-19 declaration under Section 564(b)(1) of the Act, 21 U.S.C. section 360bbb-3(b)(1), unless the authorization is terminated or revoked sooner. Performed at [facility], [address] [place], [place] [address]   Surgical pcr screen     Status: None   Collection Time: 05/23/19  5:04 PM   Specimen: Nasal Mucosa; Nasal Swab  Result Value Ref Range Status   MRSA, PCR NEGATIVE NEGATIVE Final   Staphylococcus aureus NEGATIVE NEGATIVE Final    Comment: (NOTE) The Xpert SA Assay (FDA approved for NASAL specimens in patients 22  years of age and older), is one component of a comprehensive surveillance program. It is not intended to diagnose infection nor to guide or monitor treatment. Performed at [facility], [address], [place], [place] [address]   Aerobic/Anaerobic Culture (surgical/deep wound)     Status: None (Preliminary result)   Collection Time: 05/24/19  1:22 PM   Specimen: PATH Other; Tissue  Result Value Ref Range Status   Specimen Description   Final    TOE LEFT GREAT TOE WOUND CULTURE Performed at [facility], [address], [place], [place] [address]    Special Requests   Final    NONE Performed at [facility], [address] [place], [place] [address]    Gram Stain   Final    RARE WBC PRESENT, PREDOMINANTLY PMN NO ORGANISMS SEEN    Culture   Final    FEW GRAM NEGATIVE RODS CULTURE REINCUBATED FOR BETTER GROWTH Performed at [facility], [address] [place], [place] [address]    Report Status PENDING   Incomplete    Coagulation Studies: No results for input(s): LABPROT, INR in the last 72 hours.  Urinalysis: No results for input(s): COLORURINE, LABSPEC, PHURINE, GLUCOSEU, HGBUR, BILIRUBINUR, KETONESUR, PROTEINUR, UROBILINOGEN, NITRITE, LEUKOCYTESUR in the last 72 hours.  Invalid input(s): APPERANCEUR    Imaging: Dg Foot 2 Views Left  Result Date: 05/24/2019 CLINICAL DATA:  Status post left partial first toe amputation, right third and fourth metatarsophalangeal ulceration probes close to bone EXAM: LEFT FOOT - 2 VIEW; RIGHT FOOT - 2 VIEW COMPARISON:  None. FINDINGS: Status post transmetatarsal amputation of the left first digit with overlying postoperative soft tissue changes and bandage material. No fracture or dislocation. Joint spaces are well preserved. Large plantar calcaneal spur. Extensive vascular calcinosis about the foot and ankle. Diffuse soft tissue edema of the lower leg and foot. Status post distal transmetatarsal amputation of the right fourth digit. There has been resection of the distal right third metatarsal with the digit remaining. There is no obvious bony erosion or sclerosis appreciated. The joint spaces are well preserved. Large plantar calcaneal spur. Extensive vascular calcinosis about the foot and ankle. Bandage material about the ball of the foot. IMPRESSION: 1. Status post transmetatarsal amputation of the left first digit with overlying postoperative soft tissue changes and bandage material. No fracture or dislocation. 2. Status post distal transmetatarsal amputation of the right fourth digit. There has been resection of the distal right third metatarsal with the digit remaining. There is no obvious bony erosion or sclerosis appreciated. MRI is the test of choice to assess for bone marrow edema and osteomyelitis if suspected. 3. Extensive vascular calcinosis about the bilateral feet and ankles. Electronically Signed   By: Eddie Candle M.D.   On: 05/24/2019 15:46   Dg  Foot 2 Views Right  Result Date: 05/24/2019 CLINICAL DATA:  Status post left partial first toe amputation, right third and fourth metatarsophalangeal ulceration probes close to bone EXAM: LEFT FOOT - 2 VIEW; RIGHT FOOT - 2 VIEW COMPARISON:  None. FINDINGS: Status post transmetatarsal amputation of the left first digit with overlying postoperative soft tissue changes and bandage material. No fracture or dislocation. Joint spaces are well preserved. Large plantar calcaneal spur. Extensive vascular calcinosis about the foot and ankle. Diffuse soft tissue edema of the lower leg and foot. Status post distal transmetatarsal amputation of the right fourth digit. There has been resection of the distal right third metatarsal with the digit remaining.  There is no obvious bony erosion or sclerosis appreciated. The joint spaces are well preserved. Large plantar calcaneal spur. Extensive vascular calcinosis about the foot and ankle. Bandage material about the ball of the foot. IMPRESSION: 1. Status post transmetatarsal amputation of the left first digit with overlying postoperative soft tissue changes and bandage material. No fracture or dislocation. 2. Status post distal transmetatarsal amputation of the right fourth digit. There has been resection of the distal right third metatarsal with the digit remaining. There is no obvious bony erosion or sclerosis appreciated. MRI is the test of choice to assess for bone marrow edema and osteomyelitis if suspected. 3. Extensive vascular calcinosis about the bilateral feet and ankles. Electronically Signed   By: Eddie Candle M.D.   On: 05/24/2019 15:46     Medications:   . sodium chloride 30 mL (05/24/19 0835)  . dialysis solution 2.5% low-MG/low-CA     . sodium chloride   Intravenous Once  . amoxicillin-clavulanate  1 tablet Oral Q12H  . atorvastatin  10 mg Oral q1800  . calcitRIOL  0.25 mcg Oral Daily  . calcium acetate  2,001 mg Oral TID WC  . carvedilol  25 mg Oral  BID WC  . Chlorhexidine Gluconate Cloth  6 each Topical Daily  . cholecalciferol  2,000 Units Oral Daily  . clopidogrel  75 mg Oral Q breakfast  . dextrose      . furosemide  80 mg Oral Daily  . gentamicin cream  1 application Topical Daily  . heparin  5,000 Units Subcutaneous Q8H  . hydrALAZINE  25 mg Oral Daily  . insulin aspart  0-5 Units Subcutaneous QHS  . insulin aspart  0-9 Units Subcutaneous TID WC  . insulin aspart protamine- aspart  12 Units Subcutaneous BID WC  . multivitamin  1 tablet Oral Daily  . pantoprazole  40 mg Oral Daily  . sodium chloride flush  3 mL Intravenous Q12H   sodium chloride, [DISCONTINUED] acetaminophen **OR** acetaminophen, acetaminophen, albuterol, bisacodyl, HYDROcodone-acetaminophen, morphine injection, nitroGLYCERIN, ondansetron (ZOFRAN) IV, ondansetron **OR** [DISCONTINUED] ondansetron (ZOFRAN) IV, oxyCODONE, senna-docusate, sodium chloride flush  Assessment/ Plan:  58 y.o. male with end stage renal disease on peritoneal dialysis, congestive heart failure, GERD, hypertension, peripheral vascular disease, diabetes mellitus type 2, diabetic retinopathy, diabetic gastroparesis, admitted with gangrene of left first toe.  [facility]  CAPD 1867mL fills 4 exchanges   1.  ESRD on PD. 2.  Gangrene of left first toe. 3.  Anemia of chronic kidney disease. 4.  Secondary hyperparathyroidism. 5.  Hypertension.  Plan: Patient continues to do well with [facility]CP[facility].  Continue current [facility]CPD prescription.  Ultrafiltration achieved last night was 885 cc.  Continue to use 2.5% dextrose solution.  It appears that left first toe amputation was successful.  He will need to continue to follow with podiatry.  Patient also potentially interested in switching to [facility] when he goes home.  We will discuss this further with the peritoneal dialysis team.   LOS: 3 Scott [name] 9/24/20204:20 PM

## 2019-05-25 NOTE — Evaluation (Signed)
Physical Therapy Evaluation Patient Details Name: BILBO CARCAMO MRN: [id number] DOB: 18-Mar-1961 Today's Date: 05/25/2019   History of Present Illness  pt is a 58 yo male admitted s/p left partial 1st ray amputation after worsening left great toe pain, warmness and gangrene for 1 week. PMH include CHF, CKD, CAD, PVD, HTN, DM, continuous ambulatory peritoneal dialysis status  Clinical Impression  Pt is a 58 yo male admitted for above. Pt up ambulating in room without AD upon arrival and agreed to participate with PT. Pt educated on [place] precautions and wearing heel wedge and flat bottom shoe. Pt required min guard assist for functional mobility during session. Pt ambulated with step to pattern with RW requiring min cuing for maintaining WBing precautions throughout. Pt required one standing rest break after reporting some SHOB. SpO2 at 88% and increased to above 90% quickly after cuing for taking deep breaths. Pt educated on activity pacing with pt verbalizing understanding. Pt presents with decreased strength, sensation and endurance limiting functional mobility. Pt would benefit from skilled acute PT to improve deficits noted below and ensure compliance with WBing precautions and orthotic shoe use. Pt would benefit from home health PT following hospital discharge to further improve deficits, increase independence, return to PLOF and for fall prevention since pt has reported multiple falls in the last year.     Follow Up Recommendations Home health PT    Equipment Recommendations  Rolling walker with 5" wheels    Recommendations for Other Services       Precautions / Restrictions Precautions Precautions: Fall Required Braces or Orthoses: Other Brace Other Brace: heel wedge shoe on L LE and flat bottom shoe on R LE Restrictions Weight Bearing Restrictions: Yes LLE Weight Bearing: Partial weight bearing LLE Partial Weight Bearing Percentage or Pounds: 50%      Mobility  Bed Mobility                General bed mobility comments: pt up ambulating in room upon arrival  Transfers Overall transfer level: Needs assistance Equipment used: Rolling walker (2 wheeled) Transfers: Sit to/from Stand Sit to Stand: Min guard         General transfer comment: pt performed STS from recliner requiring min cuing for hand/foot placement and maintaining WBing precautions, min guard for safety, no physical assist required  Ambulation/Gait Ambulation/Gait assistance: Min guard Gait Distance (Feet): 50 Feet Assistive device: Rolling walker (2 wheeled) Gait Pattern/deviations: Step-to pattern;Decreased stride length;Decreased stance time - left;Antalgic Gait velocity: decreased   General Gait Details: Pt ambulated with mildly antalgic gait pattern and step to pattern with RW, pt required one standing rest break halfway due to fatigue and mild SHOB, SpO2 88% with increasing to >90% with cuing for deep breaths, pt educated on activity pacing, RW used for ambulation to assist and ensure compliance with WBing precautions with pt stating that the RW helped keep weight off L LE  Stairs            Wheelchair Mobility    Modified Rankin (Stroke Patients Only)       Balance Overall balance assessment: History of Falls;Mild deficits observed, not formally tested                                           Pertinent Vitals/Pain Pain Assessment: No/denies pain    Home Living Family/patient expects to be discharged to::  Private residence Living Arrangements: Spouse/significant other Available Help at Discharge: Family;Available 24 hours/day Type of Home: Mobile home Home Access: Stairs to enter Entrance Stairs-Rails: Can reach both Entrance Stairs-Number of Steps: 3-4 Home Layout: One level Home Equipment: Grab bars - tub/shower;Cane - single point;Wheelchair - [employer] - 2 wheels Additional Comments: pt reports getting one of his bathrooms redone to be  completely accessible including tub bench unsure of timeline    Prior Function Level of Independence: Independent         Comments: pt states being completely independent with all ADLs/IADLs, and still driving, pt reports multiple falls in the last year with 1-2 the last 6 months, pt thinks cause of falls was tripping over something     Hand Dominance        Extremity/Trunk Assessment   Upper Extremity Assessment Upper Extremity Assessment: Generalized weakness    Lower Extremity Assessment Lower Extremity Assessment: Generalized weakness       Communication   Communication: No difficulties  Cognition Arousal/Alertness: Awake/alert Behavior During Therapy: WFL for tasks assessed/performed Overall Cognitive Status: Within Functional Limits for tasks assessed                                        General Comments      Exercises Total Joint Exercises Long Arc Quad: AROM;Both;10 reps Marching in Standing: AROM;Both;10 reps;Seated   Assessment/Plan    PT Assessment Patient needs continued PT services  PT Problem List Decreased strength;Decreased mobility;Decreased safety awareness;Decreased knowledge of precautions;Decreased activity tolerance;Decreased balance;Decreased knowledge of use of DME;Impaired sensation;Cardiopulmonary status limiting activity       PT Treatment Interventions DME instruction;Therapeutic exercise;Gait training;Balance training;Stair training;Neuromuscular re-education;Functional mobility training;Therapeutic activities;Patient/family education    PT Goals (Current goals can be found in the Care Plan section)  Acute Rehab PT Goals Patient Stated Goal: return home and to normal activities PT Goal Formulation: With patient Time For Goal Achievement: 06/08/19 Potential to Achieve Goals: Good    Frequency 7X/week   Barriers to discharge        Co-evaluation               AM-PAC PT "6 Clicks" Mobility  Outcome  Measure Help needed turning from your back to your side while in a flat bed without using bedrails?: A Little Help needed moving from lying on your back to sitting on the side of a flat bed without using bedrails?: A Little Help needed moving to and from a bed to a chair (including a wheelchair)?: A Little Help needed standing up from a chair using your arms (e.g., wheelchair or bedside chair)?: A Little Help needed to walk in hospital room?: A Little Help needed climbing 3-5 steps with a railing? : A Lot 6 Click Score: 17    End of Session Equipment Utilized During Treatment: Gait belt Activity Tolerance: Patient tolerated treatment well Patient left: in chair;with call bell/phone within reach Nurse Communication: Mobility status PT Visit Diagnosis: Difficulty in walking, not elsewhere classified (R26.2);Other abnormalities of gait and mobility (R26.89);Muscle weakness (generalized) (M62.81);History of falling (Z91.81)    Time: 9798-9211 PT Time Calculation (min) (ACUTE ONLY): 26 min   Charges:   PT Evaluation $PT Eval Low Complexity: 1 Low PT Treatments $Therapeutic Exercise: 8-22 mins        Elleni Mozingo PT, DPT 4:49 PM,05/25/19 [phone]

## 2019-05-25 NOTE — Progress Notes (Signed)
Order received from Dr Posey Pronto for PT

## 2019-05-25 NOTE — Progress Notes (Signed)
Welcome at [facility]                                                                                                                                                                                  Patient Demographics   Scott [name], is a 58 y.o. male, DOB - 1961-02-27, QTM:[id number]  Admit date - 05/22/2019   Admitting Physician Demetrios Loll, MD  Outpatient Primary MD for the patient is [facility], [place]   LOS - 3  Subjective: Patient had surgery yesterday denies any complaints   Review of Systems:   CONSTITUTIONAL: No documented fever. No fatigue, weakness. No weight gain, no weight loss.  EYES: No blurry or double vision.  ENT: No tinnitus. No postnasal drip. No redness of the oropharynx.  RESPIRATORY: No cough, no wheeze, no hemoptysis. No dyspnea.  CARDIOVASCULAR: No chest pain. No orthopnea. No palpitations. No syncope.  GASTROINTESTINAL: No nausea, no vomiting or diarrhea. No abdominal pain. No melena or hematochezia.  GENITOURINARY: No dysuria or hematuria.  ENDOCRINE: No polyuria or nocturia. No heat or cold intolerance.  HEMATOLOGY: No anemia. No bruising. No bleeding.  INTEGUMENTARY: left great toe with necrosis MUSCULOSKELETAL: No arthritis. No swelling. No gout.  NEUROLOGIC: No numbness, tingling, or ataxia. No seizure-type activity.  Left great toe necrosis PSYCHIATRIC: No anxiety. No insomnia. No ADD.    Vitals:   Vitals:   05/25/19 0400 05/25/19 0403 05/25/19 0854 05/25/19 1324  BP: (!) 97/53 (!) 104/52 130/71 (!) 91/50  Pulse: 70 72 75 68  Resp: 20     Temp: 98.3 F (36.8 C)   98.6 F (37 C)  TempSrc: Oral   Oral  SpO2: 90% 94% 99% 98%  Weight:      Height:        Wt Readings from Last 3 Encounters:  05/23/19 90.7 kg  09/19/18 101.8 kg  05/03/18 104.3 kg     Intake/Output Summary (Last 24 hours) at 05/25/2019 1410 Last data filed at 05/25/2019 1300 Gross per 24 hour  Intake 1261.7 ml  Output 958 ml   Net 303.7 ml    Physical Exam:   GENERAL: Pleasant-appearing in no apparent distress.  HEAD, EYES, EARS, NOSE AND THROAT: Atraumatic, normocephalic. Extraocular muscles are intact. Pupils equal and reactive to light. Sclerae anicteric. No conjunctival injection. No oro-pharyngeal erythema.  NECK: Supple. There is no jugular venous distention. No bruits, no lymphadenopathy, no thyromegaly.  HEART: Regular rate and rhythm,. No murmurs, no rubs, no clicks.  LUNGS: Clear to auscultation bilaterally. No rales or rhonchi. No wheezes.  ABDOMEN: Soft, flat, nontender, nondistended. Has good bowel sounds. No hepatosplenomegaly appreciated.  EXTREMITIES: No evidence  of any cyanosis, clubbing, or peripheral edema.  +2 pedal and radial pulses bilaterally.  Dressing in place in the foot NEUROLOGIC: The patient is alert, awake, and oriented x3 with no focal motor or sensory deficits appreciated bilaterally.  SKIN: left great toe necrosis Psych: Not anxious, depressed LN: No inguinal LN enlargement    Antibiotics   Anti-infectives (From admission, onward)   Start     Dose/Rate Route Frequency Ordered Stop   05/25/19 0715  vancomycin (VANCOCIN) IVPB 1000 mg/200 mL premix     1,000 mg 200 mL/hr over 60 Minutes Intravenous  Once 05/25/19 0707 05/25/19 1021   05/24/19 0600  ceFAZolin (ANCEF) IVPB 1 g/50 mL premix    Note to Pharmacy: To be given in specials   1 g 100 mL/hr over 30 Minutes Intravenous  Once 05/23/19 0949 05/23/19 1056   05/23/19 1430  piperacillin-tazobactam (ZOSYN) IVPB 3.375 g     3.375 g 12.5 mL/hr over 240 Minutes Intravenous Every 12 hours 05/23/19 1417     05/23/19 1417  vancomycin variable dose per unstable renal function (pharmacist dosing)      Does not apply See admin instructions 05/23/19 1417     05/22/19 1700  vancomycin (VANCOCIN) 2,000 mg in sodium chloride 0.9 % 500 mL IVPB     2,000 mg 250 mL/hr over 120 Minutes Intravenous  Once 05/22/19 1635 05/22/19 2300    05/22/19 1700  piperacillin-tazobactam (ZOSYN) IVPB 3.375 g     3.375 g 12.5 mL/hr over 240 Minutes Intravenous  Once 05/22/19 1635 05/22/19 2300      Medications   Scheduled Meds: . sodium chloride   Intravenous Once  . amLODipine  10 mg Oral Daily  . atorvastatin  10 mg Oral q1800  . calcitRIOL  0.25 mcg Oral Daily  . calcium acetate  2,001 mg Oral TID WC  . carvedilol  25 mg Oral BID WC  . Chlorhexidine Gluconate Cloth  6 each Topical Daily  . cholecalciferol  2,000 Units Oral Daily  . clopidogrel  75 mg Oral Q breakfast  . dextrose      . furosemide  80 mg Oral Daily  . gentamicin cream  1 application Topical Daily  . heparin  5,000 Units Subcutaneous Q8H  . hydrALAZINE  25 mg Oral Daily  . insulin aspart  0-5 Units Subcutaneous QHS  . insulin aspart  0-9 Units Subcutaneous TID WC  . insulin aspart protamine- aspart  12 Units Subcutaneous BID WC  . multivitamin  1 tablet Oral Daily  . pantoprazole  40 mg Oral Daily  . sodium chloride flush  3 mL Intravenous Q12H  . vancomycin variable dose per unstable renal function (pharmacist dosing)   Does not apply See admin instructions   Continuous Infusions: . sodium chloride 30 mL (05/24/19 0835)  . dialysis solution 2.5% low-MG/low-CA    . piperacillin-tazobactam (ZOSYN)  IV Stopped (05/25/19 1259)   PRN Meds:.sodium chloride, [DISCONTINUED] acetaminophen **OR** acetaminophen, acetaminophen, albuterol, bisacodyl, HYDROcodone-acetaminophen, morphine injection, nitroGLYCERIN, ondansetron (ZOFRAN) IV, ondansetron **OR** [DISCONTINUED] ondansetron (ZOFRAN) IV, oxyCODONE, senna-docusate, sodium chloride flush   Data Review:   Micro Results Recent Results (from the past 240 hour(s))  Blood culture (routine x 2)     Status: None (Preliminary result)   Collection Time: 05/22/19  3:37 PM   Specimen: BLOOD  Result Value Ref Range Status   Specimen Description BLOOD LEFT ANTECUBITAL  Final   Special Requests Blood Culture adequate  volume  Final   Culture  Final    NO GROWTH 3 DAYS Performed at [facility], [address] [place], [place] [address]    Report Status PENDING  Incomplete  Blood culture (routine x 2)     Status: None (Preliminary result)   Collection Time: 05/22/19  3:42 PM   Specimen: BLOOD  Result Value Ref Range Status   Specimen Description BLOOD BLOOD LEFT FOREARM  Final   Special Requests Blood Culture adequate volume  Final   Culture   Final    NO GROWTH 3 DAYS Performed at [facility], [address] [place], [place] [address]    Report Status PENDING  Incomplete  SARS Coronavirus 2 [facility] order, Performed in [facility] hospital lab) Nasopharyngeal Nasopharyngeal Swab     Status: None   Collection Time: 05/22/19  3:53 PM   Specimen: Nasopharyngeal Swab  Result Value Ref Range Status   SARS Coronavirus 2 NEGATIVE NEGATIVE Final    Comment: (NOTE) If result is NEGATIVE SARS-CoV-2 target nucleic acids are NOT DETECTED. The SARS-CoV-2 RNA is generally detectable in upper and lower  respiratory specimens during the acute phase of infection. The lowest  concentration of SARS-CoV-2 viral copies this assay can detect is 250  copies / mL. A negative result does not preclude SARS-CoV-2 infection  and should not be used as the sole basis for treatment or other  patient management decisions.  A negative result may occur with  improper specimen collection / handling, submission of specimen other  than nasopharyngeal swab, presence of viral mutation(s) within the  areas targeted by this assay, and inadequate number of viral copies  (<250 copies / mL). A negative result must be combined with clinical  observations, patient history, and epidemiological information. If result is POSITIVE SARS-CoV-2 target nucleic acids are DETECTED. The SARS-CoV-2 RNA is generally detectable in upper and lower  respiratory specimens dur ing the acute phase of infection.  Positive   results are indicative of active infection with SARS-CoV-2.  Clinical  correlation with patient history and other diagnostic information is  necessary to determine patient infection status.  Positive results do  not rule out bacterial infection or co-infection with other viruses. If result is PRESUMPTIVE POSTIVE SARS-CoV-2 nucleic acids MAY BE PRESENT.   A presumptive positive result was obtained on the submitted specimen  and confirmed on repeat testing.  While 2019 novel coronavirus  (SARS-CoV-2) nucleic acids may be present in the submitted sample  additional confirmatory testing may be necessary for epidemiological  and / or clinical management purposes  to differentiate between  SARS-CoV-2 and other Sarbecovirus currently known to infect humans.  If clinically indicated additional testing with an alternate test  methodology [phone]) is advised. The SARS-CoV-2 RNA is generally  detectable in upper and lower respiratory sp ecimens during the acute  phase of infection. The expected result is Negative. Fact Sheet for Patients:  StrictlyIdeas.no Fact Sheet for Healthcare Providers: BankingDealers.co.za This test is not yet approved or cleared by the [place] FDA and has been authorized for detection and/or diagnosis of SARS-CoV-2 by FDA under an Emergency Use Authorization (EUA).  This EUA will remain in effect (meaning this test can be used) for the duration of the COVID-19 declaration under Section 564(b)(1) of the Act, 21 U.S.C. section 360bbb-3(b)(1), unless the authorization is terminated or revoked sooner. Performed at [facility], [address] [place], [place] [address]   Surgical pcr screen     Status: None   Collection Time: 05/23/19  5:04 PM   Specimen: Nasal Mucosa; Nasal Swab  Result Value Ref Range Status   MRSA, PCR NEGATIVE NEGATIVE Final   Staphylococcus aureus NEGATIVE NEGATIVE Final    Comment:  (NOTE) The Xpert SA Assay (FDA approved for NASAL specimens in patients 38 years of age and older), is one component of a comprehensive surveillance program. It is not intended to diagnose infection nor to guide or monitor treatment. Performed at [facility], [address], [place], [place] [address]   Aerobic/Anaerobic Culture (surgical/deep wound)     Status: None (Preliminary result)   Collection Time: 05/24/19  1:22 PM   Specimen: PATH Other; Tissue  Result Value Ref Range Status   Specimen Description   Final    TOE LEFT GREAT TOE WOUND CULTURE Performed at [facility], [address] [place], [place] [address]    Special Requests   Final    NONE Performed at [facility], [address] [place], [place] [address]    Gram Stain   Final    RARE WBC PRESENT, PREDOMINANTLY PMN NO ORGANISMS SEEN    Culture   Final    FEW GRAM NEGATIVE RODS CULTURE REINCUBATED FOR BETTER GROWTH Performed at [facility], [address], [place], [place] [address]    Report Status PENDING  Incomplete    Radiology Reports Dg Foot 2 Views Left  Result Date: 05/24/2019 CLINICAL DATA:  Status post left partial first toe amputation, right third and fourth metatarsophalangeal ulceration probes close to bone EXAM: LEFT FOOT - 2 VIEW; RIGHT FOOT - 2 VIEW COMPARISON:  None. FINDINGS: Status post transmetatarsal amputation of the left first digit with overlying postoperative soft tissue changes and bandage material. No fracture or dislocation. Joint spaces are well preserved. Large plantar calcaneal spur. Extensive vascular calcinosis about the foot and ankle. Diffuse soft tissue edema of the lower leg and foot. Status post distal transmetatarsal amputation of the right fourth digit. There has been resection of the distal right third metatarsal with the digit remaining. There is no obvious bony erosion or sclerosis appreciated. The joint spaces are well preserved.  Large plantar calcaneal spur. Extensive vascular calcinosis about the foot and ankle. Bandage material about the ball of the foot. IMPRESSION: 1. Status post transmetatarsal amputation of the left first digit with overlying postoperative soft tissue changes and bandage material. No fracture or dislocation. 2. Status post distal transmetatarsal amputation of the right fourth digit. There has been resection of the distal right third metatarsal with the digit remaining. There is no obvious bony erosion or sclerosis appreciated. MRI is the test of choice to assess for bone marrow edema and osteomyelitis if suspected. 3. Extensive vascular calcinosis about the bilateral feet and ankles. Electronically Signed   By: Eddie Candle M.D.   On: 05/24/2019 15:46   Dg Foot 2 Views Right  Result Date: 05/24/2019 CLINICAL DATA:  Status post left partial first toe amputation, right third and fourth metatarsophalangeal ulceration probes close to bone EXAM: LEFT FOOT - 2 VIEW; RIGHT FOOT - 2 VIEW COMPARISON:  None. FINDINGS: Status post transmetatarsal amputation of the left first digit with overlying postoperative soft tissue changes and bandage material. No fracture or dislocation. Joint spaces are well preserved. Large plantar calcaneal spur. Extensive vascular calcinosis about the foot and ankle. Diffuse soft tissue edema of the lower leg and foot. Status post distal transmetatarsal amputation of the right fourth digit. There has been resection of the distal right third  metatarsal with the digit remaining. There is no obvious bony erosion or sclerosis appreciated. The joint spaces are well preserved. Large plantar calcaneal spur. Extensive vascular calcinosis about the foot and ankle. Bandage material about the ball of the foot. IMPRESSION: 1. Status post transmetatarsal amputation of the left first digit with overlying postoperative soft tissue changes and bandage material. No fracture or dislocation. 2. Status post distal  transmetatarsal amputation of the right fourth digit. There has been resection of the distal right third metatarsal with the digit remaining. There is no obvious bony erosion or sclerosis appreciated. MRI is the test of choice to assess for bone marrow edema and osteomyelitis if suspected. 3. Extensive vascular calcinosis about the bilateral feet and ankles. Electronically Signed   By: Eddie Candle M.D.   On: 05/24/2019 15:46   Dg Foot Complete Left  Result Date: 05/22/2019 CLINICAL DATA:  Left great toe pain and redness EXAM: LEFT FOOT - COMPLETE 3+ VIEW COMPARISON:  None. FINDINGS: Obliquely oriented lucency involving the dorsal cortex of the great toe distal phalanx seen only on lateral view suggestive of a nondisplaced fracture. No definite corresponding abnormality on AP or oblique views. No definite cortical destruction or periostitis appreciated. Mild degenerative changes of the first MTP and IP joints of the great toe. Prominent plantar calcaneal spur. Severe vascular calcifications. Mild soft tissue irregularity along the medial soft tissues at the level of the great toe IP joint. IMPRESSION: 1. Findings suspicious for nondisplaced fracture of the great toe distal phalanx. Consider further evaluation with dedicated radiographs of the digit as clinically indicated. 2. No specific radiographic evidence of acute osteomyelitis. Electronically Signed   By: Davina Poke M.D.   On: 05/22/2019 16:16     CBC Recent Labs  Lab 05/22/19 1511 05/23/19 0524 05/24/19 0541 05/25/19 0459  WBC 23.4* 15.9* 14.0* 13.9*  HGB 8.1* 7.2* 6.9* 7.5*  HCT 24.7* 22.0* 21.3* 23.4*  PLT 398 336 388 401*  MCV 95.4 94.8 95.5 95.9  MCH 31.3 31.0 30.9 30.7  MCHC 32.8 32.7 32.4 32.1  RDW 13.0 12.8 13.1 13.7  LYMPHSABS 0.6*  --   --   --   MONOABS 1.5*  --   --   --   EOSABS 0.3  --   --   --   BASOSABS 0.1  --   --   --     Chemistries  Recent Labs  Lab 05/22/19 1511 05/22/19 1831 05/23/19 0524  05/24/19 0541 05/25/19 0459  NA 137  --  137 137 137  K 3.4*  --  3.5 3.6 3.5  CL 99  --  103 100 97*  CO2 21*  --  20* 21* 21*  GLUCOSE 189*  --  114* 171* 190*  BUN 61*  --  65* 66* 63*  CREATININE 8.72* 8.89* 9.05* 8.97* 8.99*  CALCIUM 7.4*  --  7.2* 7.8* 7.7*  AST 30  --   --   --   --   ALT 34  --   --   --   --   ALKPHOS 139*  --   --   --   --   BILITOT 0.5  --   --   --   --    ------------------------------------------------------------------------------------------------------------------ estimated creatinine clearance is 9.9 mL/min (A) (by C-G formula based on SCr of 8.99 mg/dL (H)). ------------------------------------------------------------------------------------------------------------------ Recent Labs    05/22/19 1831  HGBA1C 6.1*   ------------------------------------------------------------------------------------------------------------------ No results for input(s): CHOL, HDL, LDLCALC, TRIG, CHOLHDL,  LDLDIRECT in the last 72 hours. ------------------------------------------------------------------------------------------------------------------ No results for input(s): TSH, T4TOTAL, T3FREE, THYROIDAB in the last 72 hours.  Invalid input(s): FREET3 ------------------------------------------------------------------------------------------------------------------ Recent Labs    05/24/19 1129  VITAMINB12 498  FOLATE 17.6  FERRITIN 2,643*  TIBC 145*  IRON 47  RETICCTPCT 1.3    Coagulation profile No results for input(s): INR, PROTIME in the last 168 hours.  No results for input(s): DDIMER in the last 72 hours.  Cardiac Enzymes No results for input(s): CKMB, TROPONINI, MYOGLOBIN in the last 168 hours.  Invalid input(s): CK ------------------------------------------------------------------------------------------------------------------ Invalid input(s): [place]   Patient is a 58 year old with peripheral vascular disease  presents with left great toe gangrene  1. Left great toe gangrene infection with leukocytosis. Status post amputation change to oral antibiotics PT evaluation likely discharge tomorrow  2. PVD.    Continue Plavix as per vascular surgery t  3. Hypertension.  Continue home hypertension medication. bp stable  4. Diabetes.  continue sliding scale, continue Lantus.  5. ESRD on on pd continue as per nephrology  6.  Anemia acute on chronic anemia hemoglobin stable today continue monitor    Code Status Orders  (From admission, onward)         Start     Ordered   05/22/19 1831  Full code  Continuous     05/22/19 1830        Code Status History    Date Active Date Inactive Code Status Order ID Comments User Context   09/18/2018 0022 09/19/2018 1608 Full Code [id number]  MayoPete Pelt, MD ED   03/09/2018 1206 03/12/2018 1712 Full Code [id number]  Henreitta Leber, MD Inpatient   01/17/2017 0338 01/20/2017 1834 Full Code [id number]  Harrie Foreman, MD ED   11/13/2016 1013 11/20/2016 1256 Full Code [id number]  Hillary Bow, MD ED   01/09/2015 1019 01/10/2015 1518 Full Code [id number]  Max Sane, MD Inpatient   12/18/2014 1620 12/19/2014 1627 Full Code [id number]  Charolette Forward, MD Inpatient   12/14/2014 1600 12/18/2014 1620 Full Code [id number]  Charolette Forward, MD Inpatient   12/13/2014 1324 12/14/2014 1600 Full Code [id number]  Charolette Forward, MD Inpatient   Advance Care Planning Activity           Consults vascular, podiatry   DVT Prophylaxis  Lovenox    Lab Results  Component Value Date   PLT 401 (H) 05/25/2019     Time Spent in minutes   73min  Greater than 50% of time spent in care coordination and counseling patient regarding the condition and plan of care.   Dustin Flock M.D on 05/25/2019 at 2:10 PM  Between 7am to 6pm - Pager - [phone]  After 6pm go to [email or web address] - Proofreader  [employer]   Office  [phone]

## 2019-05-25 NOTE — Progress Notes (Signed)
PT REMOVED FROM PD CYCLER 858UF REMOVED, NO S/S OF DISTRESS TO NOTE   05/25/19 0700  Peritoneal Catheter Left lower abdomen Continuous ambulatory  Placement Date/Time: 04/07/17 1328   Procedural Verification: Medical records & consent reviewed  Time out: Correct Patient;Correct Site;Correct Procedure;Special equipment/requirements available  Person Inserting Catheter: Dr. [name]  Catheter Locat...  Site Assessment Clean;Dry;Intact  Catheter status Clamped;Deaccessed  Dressing Gauze/Drain sponge  Dressing Status Clean;Dry  Completion  Effluent Appearance Clear;Yellow  Treatment Status Complete  Fluid Balance - CCPD  Total Output for Exchanges (mL) 858 ml  Procedure Comments  Tolerated treatment well? Yes  Peritoneal Dialysis Comments pt up in bed eating no s/s of distress  Education / Care Plan  Dialysis Education Provided Yes  Hand-Off documentation  Report given to (Full Name) erica johnson  Report received from (Full Name) Valentina Shaggy

## 2019-05-25 NOTE — Progress Notes (Signed)
Established peritoneal dialysis patient known at [facility]. Please contact me directly with any dialysis placement concerns .   Elvera Bicker Dialysis Coordinator [phone]

## 2019-05-25 NOTE — Progress Notes (Signed)
Hypoglycemic Event  CBG: 55  Treatment: Cola and crackers given first. Repeat CBG was 53. Gave Dextrose   Symptoms: Patient felt blood sugar was low  Follow-up CBG: Time:1304 CBG Result:182  Possible Reasons for Event: Patient thinks insulin made CBG drop  Comments/MD notified:None    Scott [name]

## 2019-05-25 NOTE — Progress Notes (Signed)
   05/25/19 1815  Neurological  Level of Consciousness Alert  Orientation Level Oriented X4  Respiratory  Respiratory Pattern Regular;Unlabored  Cardiac  Pulse Regular  Peritoneal Catheter Left lower abdomen Continuous ambulatory  Placement Date/Time: 04/07/17 1328   Procedural Verification: Medical records & consent reviewed  Time out: Correct Patient;Correct Site;Correct Procedure;Special equipment/requirements available  Person Inserting Catheter: Dr. [name]  Catheter Locat...  Site Assessment Clean;Dry;Intact  Drainage Description None  Catheter status Accessed;Clamped  Dressing Gauze/Drain sponge  Dressing Status Clean;Dry;Intact  Dressing Intervention Dressing changed  pt stable in room no c/os no distress noted. PD catheter WDL drsg changed pt tolerated well no s/s of infections

## 2019-05-25 NOTE — Consult Note (Signed)
Pharmacy Antibiotic Note  Scott [name] is a 58 y.o. male admitted on 05/22/2019 with cellulitis/Left great toe gangrene infection with leukocytosis.  Patient is on continuous ambulatory peritoneal dialyses.    Pharmacy has been consulted for Vancomycin/Zosyn dosing.  Plan: Continue Zosyn 3.375g IV q12h (4 hour infusion).  Patient received Vancomycin IV loading dose of 2000mg  9/21 @ 1753  F/u Vancomycin level 9/24 @ 0459 17 ng/ml (level <20 mg/L)   Will give additional dose of 1000mg  IV x 1 and recheck level in 3 days and re-dose if level < 20  Height: 5\' 10"  (177.8 cm) Weight: 199 lb 15.3 oz (90.7 kg) IBW/kg (Calculated) : 73  Temp (24hrs), Avg:98.1 F (36.7 C), Min:97.5 F (36.4 C), Max:99.1 F (37.3 C)  Recent Labs  Lab 05/22/19 1511 05/22/19 1831 05/22/19 1835 05/23/19 0524 05/24/19 0541 05/25/19 0459  WBC 23.4*  --   --  15.9* 14.0* 13.9*  CREATININE 8.72* 8.89*  --  9.05* 8.97* 8.99*  LATICACIDVEN 1.1  --  1.2  --   --   --   VANCORANDOM  --   --   --   --   --  17    Estimated Creatinine Clearance: 9.9 mL/min (A) (by C-G formula based on SCr of 8.99 mg/dL (H)).    No Known Allergies  Antimicrobials this admission: Vancomycin 9/21 >> Zosyn 9/21 >>  Dose adjustments this admission: None  Microbiology results: 9/21 BCx: NG x 3 days 9/23 Wd Cx GNR's re-incubated for further growth  Thank you for allowing pharmacy to be a part of this patient's care.  Lu Duffel, PharmD, BCPS Clinical Pharmacist 05/25/2019 12:32 PM

## 2019-05-25 NOTE — Progress Notes (Signed)
   05/25/19 1831  Cycler Setup  Total Number of Exchanges 4  Fill Volume 1800  Dianeal Solution Dextrose 2.5% in 6000 mL  Fill Time - Minute(s) 10  Drain Time - Minute(s) 20 mins  Pause Option Status Initiated  Exit Site Care Performed Yes  Hand-Off documentation  Report received from (Full Name) Jody RN  PD tx initiated without issues pt stable tx running.

## 2019-05-25 NOTE — Progress Notes (Addendum)
PHARMACY NOTE -  ANTIBIOTIC RENAL DOSE ADJUSTMENT    Pharmacy to assist with antibiotic renal dose adjustment.   Patient has been initiated on Augmentin 875mg  every 12 hours  SCr 8.9, estimated CrCl 9.26ml/min  Has has PMH of ESRD on PD.   Current dosage is  not appropriate for ESRD on PD.   Dose has been changed to Augmentin 500 mg every 12 hours- Peritoneal dialysis dosing  Pernell Dupre, PharmD, BCPS Clinical Pharmacist 05/25/2019 2:18 PM

## 2019-05-25 NOTE — Progress Notes (Signed)
PODIATRY / FOOT AND ANKLE SURGERY PROGRESS NOTE  Requesting Physician: Demetrios Loll MD  Reason for consult: L hallux infection, gangrene  Chief Complaint: Black L big toe   HPI: Scott [name] is a 58 y.o. male who presents status post 1 day left partial first ray amputation.  Patient states that he only is in mild pain today.  He states that he does not have much feeling to both feet.  Patient denies nausea, vomiting, fever, and chills.  Patient has no dressing on the right foot today which was applied after the surgical procedure due to an ulceration present.  Patient has been ambulating with heel contact to the left foot and try to stay off of the left foot is much possible.  PMHx:  Past Medical History:  Diagnosis Date  . Arthritis    "left arm; right leg" (12/13/2014)  . Asthma   . CHF (congestive heart failure) ([place])   . Chronic disease anemia    [name] 12/13/2014  . Chronic kidney disease (CKD), stage IV (severe) ([place])    [name] 12/13/2014... on dialysis  . Complication of anesthesia    unable to urinate after CAPD urgery  . Continuous ambulatory peritoneal dialysis status ([place])   . Coronary artery disease    [name] 12/13/2014  . Depression   . Dysrhythmia    patient unaware of irregular heartbeat  . GERD (gastroesophageal reflux disease)   . High cholesterol    [name] 12/13/2014  . Hypertension   . Non-Q wave myocardial infarction ([place])    [name] 12/13/2014  . PVD (peripheral vascular disease) ([place])    [name] 12/13/2014  . Type II diabetes mellitus ([place])     Surgical Hx:  Past Surgical History:  Procedure Laterality Date  . AMPUTATION TOE Left 05/24/2019   Procedure: Left great toe amputation;  Surgeon: Caroline More, DPM;  Location: [facility];  Service: Podiatry;  Laterality: Left;  . AV FISTULA PLACEMENT Right 04/07/2017   Procedure: ARTERIOVENOUS (AV) FISTULA CREATION ( BRACHIALCEPHALIC );  Surgeon: Katha Cabal, MD;  Location: [facility];  Service: Vascular;   Laterality: Right;  . CAPD INSERTION N/A 04/07/2017   Procedure: LAPAROSCOPIC INSERTION CONTINUOUS AMBULATORY PERITONEAL DIALYSIS  (CAPD) CATHETER;  Surgeon: Katha Cabal, MD;  Location: [facility];  Service: Vascular;  Laterality: N/A;  . CARDIAC CATHETERIZATION  11/2014  . COLONOSCOPY WITH PROPOFOL N/A 12/01/2017   Procedure: COLONOSCOPY WITH PROPOFOL;  Surgeon: Lin Landsman, MD;  Location: [place];  Service: Endoscopy;  Laterality: N/A;  Diabetic - insulin  . COLONOSCOPY WITH PROPOFOL N/A 12/29/2017   Procedure: COLONOSCOPY WITH PROPOFOL;  Surgeon: Lin Landsman, MD;  Location: [place];  Service: Endoscopy;  Laterality: N/A;  . DIALYSIS/PERMA CATHETER INSERTION N/A 01/18/2017   Procedure: Dialysis/Perma Catheter Insertion;  Surgeon: Algernon Huxley, MD;  Location: [place] [facility];  Service: Cardiovascular;  Laterality: N/A;  . DIALYSIS/PERMA CATHETER REMOVAL N/A 07/26/2017   Procedure: DIALYSIS/PERMA CATHETER REMOVAL;  Surgeon: Algernon Huxley, MD;  Location: [place] [facility];  Service: Cardiovascular;  Laterality: N/A;  . INCISION AND DRAINAGE OF WOUND Right ~ 10/2014   "4th toe foot"  . LOWER EXTREMITY ANGIOGRAPHY Left 05/23/2019   Procedure: Lower Extremity Angiography;  Surgeon: Katha Cabal, MD;  Location: [place] [facility];  Service: Cardiovascular;  Laterality: Left;  . PERCUTANEOUS CORONARY STENT INTERVENTION (PCI-S) N/A 12/14/2014   Procedure: PERCUTANEOUS CORONARY STENT INTERVENTION (PCI-S);  Surgeon: Charolette Forward, MD;  Location: [facility]  CATH LAB;  Service: Cardiovascular;  Laterality: N/A;  . PERCUTANEOUS CORONARY STENT INTERVENTION (PCI-S) N/A 12/18/2014   Procedure: PERCUTANEOUS CORONARY STENT INTERVENTION (PCI-S);  Surgeon: Charolette Forward, MD;  Location: [facility] CATH LAB;  Service: Cardiovascular;  Laterality: N/A;  . POLYPECTOMY  12/01/2017   Procedure: POLYPECTOMY;  Surgeon: Lin Landsman, MD;  Location: [place];  Service:  Endoscopy;;  . POLYPECTOMY  12/29/2017   Procedure: POLYPECTOMY;  Surgeon: Lin Landsman, MD;  Location: [place];  Service: Endoscopy;;  . TOE AMPUTATION Right 2015   4th toe    FHx:  Family History  Problem Relation Age of Onset  . Cancer Mother        Lung  . Cancer Father        Lung  . Diabetes Sister   . Diabetes Brother   . CAD Brother   . Hernia Son   . Cancer Brother     Social History:  reports that he quit smoking about 4 years ago. His smoking use included cigarettes. He started smoking about 34 years ago. He has a 30.00 pack-year smoking history. He has never used smokeless tobacco. He reports current alcohol use. He reports that he does not use drugs.  Allergies: No Known Allergies  Review of Systems: General ROS: negative Psychological ROS: negative Allergy and Immunology ROS: negative Respiratory ROS: no cough, shortness of breath, or wheezing Cardiovascular ROS: no chest pain or dyspnea on exertion Gastrointestinal ROS: no abdominal pain, change in bowel habits, or black or bloody stools Musculoskeletal ROS: positive for - joint pain and joint swelling Neurological ROS: positive for - numbness/tingling Dermatological ROS: Left partial first ray amputation incision site  Medications Prior to Admission  Medication Sig Dispense Refill  . amLODipine (NORVASC) 10 MG tablet Take 10 mg by mouth daily.    [name] aspirin EC 81 MG tablet Take 1 tablet (81 mg total) by mouth daily. 30 tablet 0  . atorvastatin (LIPITOR) 40 MG tablet Take 40 mg by mouth daily.    . B Complex-C-Folic Acid (DIALYVITE 053) 0.8 MG TABS Take 1 tablet by mouth daily.  6  . calcitRIOL (ROCALTROL) 0.25 MCG capsule Take 0.25 mcg by mouth daily.    . calcium acetate (PHOSLO) 667 MG capsule Take 2,001 mg by mouth 3 (three) times daily with meals. Takes 1-2 caps if he has snacks    . carvedilol (COREG) 25 MG tablet Take 1 tablet (25 mg total) by mouth 2 (two) times daily with a meal. 180  tablet 3  . furosemide (LASIX) 80 MG tablet Take 80 mg by mouth daily.    . insulin aspart protamine- aspart (NOVOLOG MIX 70/30) (70-30) 100 UNIT/ML injection Inject 0.25 mLs (25 Units total) into the skin 2 (two) times daily with a meal. (Patient taking differently: Inject 20-25 Units into the skin 2 (two) times daily with a meal. ) 20 mL 1  . omeprazole (PRILOSEC) 20 MG capsule Take 1 capsule (20 mg total) by mouth daily. 30 capsule 0  . albuterol (PROVENTIL HFA;VENTOLIN HFA) 108 (90 BASE) MCG/ACT inhaler Inhale 2 puffs into the lungs every 6 (six) hours as needed for wheezing or shortness of breath. 1 Inhaler 2  . Cholecalciferol (VITAMIN D) 2000 units tablet Take 2,000 Units by mouth daily. Unsure how many units    . gentamicin cream (GARAMYCIN) 0.1 % Apply 1 application topically daily.    . hydrALAZINE (APRESOLINE) 25 MG tablet Take 1 tablet (25 mg total) by mouth  3 (three) times daily. (Patient taking differently: Take 25 mg by mouth 3 (three) times daily. Pt states he only takes once a day) 90 tablet 1  . HYDROcodone-acetaminophen (NORCO/VICODIN) 5-325 MG tablet Take 1-2 tablets by mouth every 4 (four) hours as needed for moderate pain. (Patient not taking: Reported on 05/24/2019) 15 tablet 0  . nitroGLYCERIN (NITROSTAT) 0.4 MG SL tablet Place 0.4 mg under the tongue every 5 (five) minutes as needed for chest pain. Pt needs new Rx. Bottle has expried    . ONE TOUCH ULTRA TEST test strip     . prasugrel (EFFIENT) 10 MG TABS tablet Take 10 mg by mouth daily.      Physical Exam: General: Alert and oriented.  No apparent distress.  Vascular: DP/PT pulses nonpalpable bilateral, capillary fill time intact to digits that remained bilateral. Erythema and edema noted to the left medial forefoot.  Neuro: Light touch sensation reduced to digits bilateral.  Derm: Incision site to the left partial first ray amputation site appears to be well coapted with sutures intact.  There is apparent dusky  changes to the distal aspect of the flaps.  Capillary fill time is intact to the dorsal aspect of the flap but the plantar flap appears to have delayed CFT.    Wound to the plantar aspect of the third MPJ, measures approximately 1 cm x 1.2 cm x 0.4 cm to the depth of subcutaneous tissue, hyperkeratotic borders with fibro-granular base.  After debridement wound appears fibro-granular.  MSK: Left partial first ray amputation, right partial fourth ray amputation/third MPJ resection.  Results for orders placed or performed during the hospital encounter of 05/22/19 (from the past 48 hour(s))  Surgical pcr screen     Status: None   Collection Time: 05/23/19  5:04 PM   Specimen: Nasal Mucosa; Nasal Swab  Result Value Ref Range   MRSA, PCR NEGATIVE NEGATIVE   Staphylococcus aureus NEGATIVE NEGATIVE    Comment: (NOTE) The Xpert SA Assay (FDA approved for NASAL specimens in patients 83 years of age and older), is one component of a comprehensive surveillance program. It is not intended to diagnose infection nor to guide or monitor treatment. Performed at [facility], [address] [place], [place] [address]   Glucose, capillary     Status: None   Collection Time: 05/23/19  9:00 PM  Result Value Ref Range   Glucose-Capillary 79 70 - 99 mg/dL  CBC     Status: Abnormal   Collection Time: 05/24/19  5:41 AM  Result Value Ref Range   WBC 14.0 (H) 4.0 - 10.5 K/uL   RBC 2.23 (L) 4.22 - 5.81 MIL/uL   Hemoglobin 6.9 (L) 13.0 - 17.0 g/dL   HCT 21.3 (L) 39.0 - 52.0 %   MCV 95.5 80.0 - 100.0 fL   MCH 30.9 26.0 - 34.0 pg   MCHC 32.4 30.0 - 36.0 g/dL   RDW 13.1 11.5 - 15.5 %   Platelets 388 150 - 400 K/uL   nRBC 0.0 0.0 - 0.2 %    Comment: Performed at [facility], [address], [place], [place] [address]  Basic metabolic panel     Status: Abnormal   Collection Time: 05/24/19  5:41 AM  Result Value Ref Range   Sodium 137 135 - 145 mmol/L   Potassium 3.6 3.5 - 5.1 mmol/L    Chloride 100 98 - 111 mmol/L   CO2 21 (L) 22 - 32 mmol/L   Glucose, Bld 171 (  H) 70 - 99 mg/dL   BUN 66 (H) 6 - 20 mg/dL   Creatinine, Ser 8.97 (H) 0.61 - 1.24 mg/dL   Calcium 7.8 (L) 8.9 - 10.3 mg/dL   GFR calc non Af Amer 6 (L) >60 mL/min   GFR calc Af Amer 7 (L) >60 mL/min   Anion gap 16 (H) 5 - 15    Comment: Performed at [facility], [address] [place], [place] [address]  Glucose, capillary     Status: Abnormal   Collection Time: 05/24/19  7:49 AM  Result Value Ref Range   Glucose-Capillary 155 (H) 70 - 99 mg/dL  Prepare RBC     Status: None   Collection Time: 05/24/19 10:59 AM  Result Value Ref Range   Order Confirmation      ORDER PROCESSED BY BLOOD BANK Performed at [facility], [address] [place], [place] [address]   Vitamin B12     Status: None   Collection Time: 05/24/19 11:29 AM  Result Value Ref Range   Vitamin B-12 498 180 - 914 pg/mL    Comment: (NOTE) This assay is not validated for testing neonatal or myeloproliferative syndrome specimens for Vitamin B12 levels. Performed at [facility], [place] [address] [place], [place] [address]   Folate     Status: None   Collection Time: 05/24/19 11:29 AM  Result Value Ref Range   Folate 17.6 >5.9 ng/mL    Comment: Performed at [facility], [address] [place], [place] [address]  Iron and TIBC     Status: Abnormal   Collection Time: 05/24/19 11:29 AM  Result Value Ref Range   Iron 47 45 - 182 ug/dL   TIBC 145 (L) 250 - 450 ug/dL   Saturation Ratios 32 17.9 - 39.5 %   UIBC 98 ug/dL    Comment: Performed at [facility], [address] [place], [place] [address]  Ferritin     Status: Abnormal   Collection Time: 05/24/19 11:29 AM  Result Value Ref Range   Ferritin 2,643 (H) 24 - 336 ng/mL    Comment: Performed at [facility], [address] [place], [place] [address]  Reticulocytes     Status: Abnormal   Collection Time: 05/24/19 11:29 AM   Result Value Ref Range   Retic Ct Pct 1.3 0.4 - 3.1 %   RBC. 2.15 (L) 4.22 - 5.81 MIL/uL   Retic Count, Absolute 28.4 19.0 - 186.0 K/uL   Immature Retic Fract 27.4 (H) 2.3 - 15.9 %    Comment: Performed at [facility], [address] [place], [place] [address]  Type and screen     Status: None   Collection Time: 05/24/19 11:29 AM  Result Value Ref Range   ABO/RH(D) O POS    Antibody Screen NEG    Sample Expiration 05/27/2019,2359    Unit Number [id number]    Blood Component Type RBC LR PHER2    Unit division 00    Status of Unit ISSUED,FINAL    Transfusion Status OK TO TRANSFUSE    Crossmatch Result      Compatible Performed at [facility], [address] [place], [place] [address]   Glucose, capillary     Status: None   Collection Time: 05/24/19 12:13 PM  Result Value Ref Range   Glucose-Capillary 88 70 - 99 mg/dL  Aerobic/Anaerobic Culture (surgical/deep wound)     Status: None (Preliminary result)   Collection  Time: 05/24/19  1:22 PM   Specimen: PATH Other; Tissue  Result Value Ref Range   Specimen Description      TOE LEFT GREAT TOE WOUND CULTURE Performed at [facility], [address] [place], [place] [address]    Special Requests      NONE Performed at [facility], [address] [place], [place] [address]    Gram Stain      RARE WBC PRESENT, PREDOMINANTLY PMN NO ORGANISMS SEEN    Culture      FEW GRAM NEGATIVE RODS CULTURE REINCUBATED FOR BETTER GROWTH Performed at [facility], [place] [address] [place], [place] [address]    Report Status PENDING   Glucose, capillary     Status: None   Collection Time: 05/24/19  2:02 PM  Result Value Ref Range   Glucose-Capillary 98 70 - 99 mg/dL  Glucose, capillary     Status: Abnormal   Collection Time: 05/24/19  4:14 PM  Result Value Ref Range   Glucose-Capillary 107 (H) 70 - 99 mg/dL  Glucose, capillary     Status: Abnormal   Collection Time: 05/24/19  9:44  PM  Result Value Ref Range   Glucose-Capillary 147 (H) 70 - 99 mg/dL  Vancomycin, random     Status: None   Collection Time: 05/25/19  4:59 AM  Result Value Ref Range   Vancomycin Rm 17     Comment:        Random Vancomycin therapeutic range is dependent on dosage and time of specimen collection. A peak range is 20.0-40.0 ug/mL A trough range is 5.0-15.0 ug/mL        Performed at [facility], [address] [place], [place] [address]   CBC     Status: Abnormal   Collection Time: 05/25/19  4:59 AM  Result Value Ref Range   WBC 13.9 (H) 4.0 - 10.5 K/uL   RBC 2.44 (L) 4.22 - 5.81 MIL/uL   Hemoglobin 7.5 (L) 13.0 - 17.0 g/dL   HCT 23.4 (L) 39.0 - 52.0 %   MCV 95.9 80.0 - 100.0 fL   MCH 30.7 26.0 - 34.0 pg   MCHC 32.1 30.0 - 36.0 g/dL   RDW 13.7 11.5 - 15.5 %   Platelets 401 (H) 150 - 400 K/uL   nRBC 0.0 0.0 - 0.2 %    Comment: Performed at [facility], [address] [place], [place] [address]  Renal function panel     Status: Abnormal   Collection Time: 05/25/19  4:59 AM  Result Value Ref Range   Sodium 137 135 - 145 mmol/L   Potassium 3.5 3.5 - 5.1 mmol/L   Chloride 97 (L) 98 - 111 mmol/L   CO2 21 (L) 22 - 32 mmol/L   Glucose, Bld 190 (H) 70 - 99 mg/dL   BUN 63 (H) 6 - 20 mg/dL   Creatinine, Ser 8.99 (H) 0.61 - 1.24 mg/dL   Calcium 7.7 (L) 8.9 - 10.3 mg/dL   Phosphorus 8.7 (H) 2.5 - 4.6 mg/dL   Albumin 2.2 (L) 3.5 - 5.0 g/dL   GFR calc non Af Amer 6 (L) >60 mL/min   GFR calc Af Amer 7 (L) >60 mL/min   Anion gap 19 (H) 5 - 15    Comment: Performed at [facility], [address] [place], [place] [address]  Glucose, capillary     Status: Abnormal   Collection Time: 05/25/19  7:56 AM  Result Value  Ref Range   Glucose-Capillary 117 (H) 70 - 99 mg/dL   Comment 1 Notify RN   Glucose, capillary     Status: Abnormal   Collection Time: 05/25/19 12:06 PM  Result Value Ref Range   Glucose-Capillary 55 (L) 70 - 99 mg/dL   Comment 1 Notify  RN   Glucose, capillary     Status: Abnormal   Collection Time: 05/25/19 12:33 PM  Result Value Ref Range   Glucose-Capillary 53 (L) 70 - 99 mg/dL  Glucose, capillary     Status: Abnormal   Collection Time: 05/25/19  1:04 PM  Result Value Ref Range   Glucose-Capillary 182 (H) 70 - 99 mg/dL   Dg Foot 2 Views Left  Result Date: 05/24/2019 CLINICAL DATA:  Status post left partial first toe amputation, right third and fourth metatarsophalangeal ulceration probes close to bone EXAM: LEFT FOOT - 2 VIEW; RIGHT FOOT - 2 VIEW COMPARISON:  None. FINDINGS: Status post transmetatarsal amputation of the left first digit with overlying postoperative soft tissue changes and bandage material. No fracture or dislocation. Joint spaces are well preserved. Large plantar calcaneal spur. Extensive vascular calcinosis about the foot and ankle. Diffuse soft tissue edema of the lower leg and foot. Status post distal transmetatarsal amputation of the right fourth digit. There has been resection of the distal right third metatarsal with the digit remaining. There is no obvious bony erosion or sclerosis appreciated. The joint spaces are well preserved. Large plantar calcaneal spur. Extensive vascular calcinosis about the foot and ankle. Bandage material about the ball of the foot. IMPRESSION: 1. Status post transmetatarsal amputation of the left first digit with overlying postoperative soft tissue changes and bandage material. No fracture or dislocation. 2. Status post distal transmetatarsal amputation of the right fourth digit. There has been resection of the distal right third metatarsal with the digit remaining. There is no obvious bony erosion or sclerosis appreciated. MRI is the test of choice to assess for bone marrow edema and osteomyelitis if suspected. 3. Extensive vascular calcinosis about the bilateral feet and ankles. Electronically Signed   By: Eddie Candle M.D.   On: 05/24/2019 15:46   Dg Foot 2 Views  Right  Result Date: 05/24/2019 CLINICAL DATA:  Status post left partial first toe amputation, right third and fourth metatarsophalangeal ulceration probes close to bone EXAM: LEFT FOOT - 2 VIEW; RIGHT FOOT - 2 VIEW COMPARISON:  None. FINDINGS: Status post transmetatarsal amputation of the left first digit with overlying postoperative soft tissue changes and bandage material. No fracture or dislocation. Joint spaces are well preserved. Large plantar calcaneal spur. Extensive vascular calcinosis about the foot and ankle. Diffuse soft tissue edema of the lower leg and foot. Status post distal transmetatarsal amputation of the right fourth digit. There has been resection of the distal right third metatarsal with the digit remaining. There is no obvious bony erosion or sclerosis appreciated. The joint spaces are well preserved. Large plantar calcaneal spur. Extensive vascular calcinosis about the foot and ankle. Bandage material about the ball of the foot. IMPRESSION: 1. Status post transmetatarsal amputation of the left first digit with overlying postoperative soft tissue changes and bandage material. No fracture or dislocation. 2. Status post distal transmetatarsal amputation of the right fourth digit. There has been resection of the distal right third metatarsal with the digit remaining. There is no obvious bony erosion or sclerosis appreciated. MRI is the test of choice to assess for bone marrow edema and osteomyelitis if suspected. 3. Extensive  vascular calcinosis about the bilateral feet and ankles. Electronically Signed   By: Eddie Candle M.D.   On: 05/24/2019 15:46    Blood pressure (!) 91/50, pulse 68, temperature 98.6 F (37 C), temperature source Oral, resp. rate 20, height 5\' 10"  (1.778 m), weight 90.7 kg, SpO2 98 %.   Assessment 1. 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 Cellulitis left hallux/forefoot 3. Diabetes type 2 with polyneuropathy 4. PVD status post  revascularization 5. Right plantar third MPJ subcutaneous diabetic foot ulceration  Plan -Examined both feet. -Reviewed x-rays and discussed in detail with patient postoperatively as well as right foot.  No signs of osteomyelitis present.. -Remove dressing to both feet.  Right foot wound appears to be stable at this time with no signs of infection present.  Dressed with Betadine dressing.  Change every other day.  Left foot incision site appears to be well coapted with sutures intact but dusky changes to the plantar aspect of the flap distally.  Re-changed with Xeroform, 4 x 4 gauze, Kerlix, Ace wrap with minimal compression.  Appears stable with minimal to no signs of infection at this time. -Patient to continue partial weightbearing with heel contact only to the left foot and surgical shoe to the right foot. -Leave left foot dressing clean, dry, and intact until postoperative visit as outpatient.  If dressing becomes saturated or loosened recommend changing dressing by placing Betadine soaked gauze to the incision site followed by 4 x 4 gauze, Kerlix, Ace wrap with no compression. -Believe that all infection was removed with amputation.  Patient can be discharged on antibiotics based on culture results intraoperatively.  Growth so far of gram-negative rods.  Appreciate medicine recs for antibiotics at discharge for 7 to 10-day course.  Podiatry team to follow peripherally at this time.  Recommend patient has continued follow-up with vascular service as outpatient as he could potentially need further amputation due to persistent necrosis of tissues.  Follow-up instruction patient chart.  Please contact if any further questions.  Caroline More 05/25/2019, 4:57 PM

## 2019-05-25 NOTE — Progress Notes (Signed)
BP 121/41. It was 91/50 earlier this afternoon. Dr Posey Pronto gave the order to hold afternoon dose of coreg

## 2019-05-25 NOTE — Care Management Important Message (Signed)
Important Message  Patient Details  Name: Scott [name] MRN: [id number] Date of Birth: 05/19/1961   Medicare Important Message Given:  Yes     Dannette Barbara 05/25/2019, 12:23 PM

## 2019-05-26 LAB — CBC
HCT: 24.4 % — ABNORMAL LOW (ref 39.0–52.0)
Hemoglobin: 7.7 g/dL — ABNORMAL LOW (ref 13.0–17.0)
MCH: 30.3 pg (ref 26.0–34.0)
MCHC: 31.6 g/dL (ref 30.0–36.0)
MCV: 96.1 fL (ref 80.0–100.0)
Platelets: 398 10*3/uL (ref 150–400)
RBC: 2.54 MIL/uL — ABNORMAL LOW (ref 4.22–5.81)
RDW: 13.6 % (ref 11.5–15.5)
WBC: 15 10*3/uL — ABNORMAL HIGH (ref 4.0–10.5)
nRBC: 0 % (ref 0.0–0.2)

## 2019-05-26 LAB — GLUCOSE, CAPILLARY
Glucose-Capillary: 120 mg/dL — ABNORMAL HIGH (ref 70–99)
Glucose-Capillary: 44 mg/dL — CL (ref 70–99)
Glucose-Capillary: 80 mg/dL (ref 70–99)

## 2019-05-26 LAB — BASIC METABOLIC PANEL
Anion gap: 16 — ABNORMAL HIGH (ref 5–15)
BUN: 59 mg/dL — ABNORMAL HIGH (ref 6–20)
CO2: 21 mmol/L — ABNORMAL LOW (ref 22–32)
Calcium: 7.7 mg/dL — ABNORMAL LOW (ref 8.9–10.3)
Chloride: 99 mmol/L (ref 98–111)
Creatinine, Ser: 9.38 mg/dL — ABNORMAL HIGH (ref 0.61–1.24)
GFR calc Af Amer: 6 mL/min — ABNORMAL LOW (ref >60)
GFR calc non Af Amer: 5 mL/min — ABNORMAL LOW (ref >60)
Glucose, Bld: 108 mg/dL — ABNORMAL HIGH (ref 70–99)
Potassium: 3.3 mmol/L — ABNORMAL LOW (ref 3.5–5.1)
Sodium: 136 mmol/L (ref 135–145)

## 2019-05-26 LAB — SURGICAL PATHOLOGY

## 2019-05-26 MED ORDER — HYDROCODONE-ACETAMINOPHEN 5-325 MG PO TABS: 1.0000 | ORAL_TABLET | Freq: Four times a day (QID) | ORAL | 0 refills | Status: DC | PRN

## 2019-05-26 MED ORDER — CLOPIDOGREL BISULFATE 75 MG PO TABS: 75.0000 mg | ORAL_TABLET | Freq: Every day | ORAL | 2 refills | Status: DC

## 2019-05-26 MED ORDER — AMOXICILLIN-POT CLAVULANATE 500-125 MG PO TABS: 1.0000 | ORAL_TABLET | Freq: Two times a day (BID) | ORAL | 0 refills | Status: AC

## 2019-05-26 NOTE — Progress Notes (Signed)
   05/26/19 0845  Neurological  Level of Consciousness Alert  Orientation Level Oriented X4  Respiratory  Respiratory Pattern Regular;Unlabored  Cardiac  Pulse Regular  Peritoneal Catheter Left lower abdomen Continuous ambulatory  Placement Date/Time: 04/07/17 1328   Procedural Verification: Medical records & consent reviewed  Time out: Correct Patient;Correct Site;Correct Procedure;Special equipment/requirements available  Person Inserting Catheter: Dr. [name]  Catheter Locat...  Site Assessment Clean;Dry;Intact  Drainage Description Yellow;Clear  Catheter status Clamped;Deaccessed  Dressing Gauze/Drain sponge  Dressing Status Clean;Dry;Intact  pt awake watching tv no c/os PD exchange completed without issues..Maintain hydration by drinking small amounts of clear fluids frequently, then soft diet, and then advance diet as tolerated. May use OTC Imodium if desired for any diarrhea.  Call if symptoms worsen, high fever, severe weakness or fainting, increased abdominal pain, blood in stool or vomit, or failure to improve in 2-3 days. Removal 830ml 225 initial drain volume. Tolerated well PD cath [facility] secured with guaze and tape.

## 2019-05-26 NOTE — Discharge Instructions (Signed)
Podiatry discharge instructions: 1.  Keep surgical dressings to left foot clean, dry, and intact.  If dressings become saturated or loosened, please call clinic for instructions or ok to change with betadine to the incision site followed by 4x4 gauze, kerlix, tape, ace wrap with no compression. 2.  Change dressings every other day to the right foot consisting of Betadine soaked gauze to the wound followed by 4 x 4 gauze, Kerlix, tape, and Ace wrap. 3.  Ambulate in surgical shoe at all times to the right foot and surgical shoe to the left foot at all times also but weight-bear with heel contact only to the left side which she had surgery on. 4.  Take antibiotics as instructed until gone. 5.  Please make follow-up appointment for within 1 week of discharge date for further care at the [facility] clinic with [name].

## 2019-05-26 NOTE — Progress Notes (Signed)
Inpatient Diabetes Program Recommendations  AACE/ADA: New Consensus Statement on Inpatient Glycemic Control (2015)  Target Ranges:  Prepandial:   less than 140 mg/dL      Peak postprandial:   less than 180 mg/dL (1-2 hours)      Critically ill patients:  140 - 180 mg/dL   Results for [name], Scott [name] (MRN [id number]) as of 05/26/2019 11:47  Ref. Range 05/25/2019 07:56 05/25/2019 12:06 05/25/2019 12:33 05/25/2019 13:04 05/25/2019 17:13 05/25/2019 20:39  Glucose-Capillary Latest Ref Range: 70 - 99 mg/dL 117 (H)  25 units 70/30 Insulin given at 9am 55 (L) 53 (L) 182 (H) 104 (H)  REFUSED 70/30 Insulin 221 (H)  2 units NOVOLOG    Results for Scott [name], Scott [name] (MRN [id number]) as of 05/26/2019 11:47  Ref. Range 05/26/2019 07:33 05/26/2019 11:34  Glucose-Capillary Latest Ref Range: 70 - 99 mg/dL 120 (H)  12 units 70/30 insulin 44 (LL)    Home DM Meds: 70/30 Insulin 20-25 units BID  Current Orders: 70/30 Insulin 12 units BID      Novolog Sensitive Correction Scale/ SSI (0-9 units) TID AC + HS     MD- Note patient had Hypoglycemia yesterday at 12pm after getting 25 units 70/30 Insulin with Breakfast.  70/30 Insulin dose lowered to 12 units BID last PM.  Patient refused 70/30 Insulin last PM with Dinner.  Received 12 units 70/30 Insulin this AM with Breakfast and had Hypoglycemia again at 12pm today.  Please consider reducing 70/30 Insulin further to 5 units BID     --Will follow patient during hospitalization--  Wyn Quaker RN, MSN, CDE Diabetes Coordinator Inpatient Glycemic Control Team Team Pager: [phone] (8a-5p)

## 2019-05-26 NOTE — TOC Initial Note (Signed)
Transition of Care [facility]) - Initial/Assessment Note    Patient Details  Name: Scott [name] MRN: [id number] Date of Birth: 1961/01/27  [facility] of [facility] CM/SW Contact:    Beverly Sessions, RN Phone Number: 05/26/2019, 4:40 PM  Clinical Narrative:                 Patient admitted for toe infection Patient states that he lives at home with girlfriend.  States that g/f provides transportation to appointments and HD.   Follow up appointment made with PCP at [facility].  Patient states he uses their pharmacy and denies any issues obtaining his medications.   Patient states that he has a RW at home  PT has assessed patient and recommends home health PT  Patient agreeable to home health services.  Patient states he does not have a preference of home health agnecy.  Patient will need RN as well for dressing changes.  Referral made to [facility] with [place].    Expected Discharge Plan: [place] Barriers to Discharge: No Barriers Identified   Patient Goals and CMS Choice        Expected Discharge Plan and Services Expected Discharge Plan: [place]         Expected Discharge Date: 05/26/19                         HH Arranged: RN, PT [place] Agency: [place] ([place]) Date HH Agency Contacted: 05/26/19   Representative spoke with at [place]: Corene Cornea  Prior Living Arrangements/Services   Lives with:: Significant Other Patient language and need for interpreter reviewed:: Yes Do you feel safe going back to the place where you live?: Yes      Need for Family Participation in Patient Care: Yes (Comment) Care giver support system in place?: Yes (comment) Current home services: DME Criminal Activity/Legal Involvement Pertinent to Current Situation/Hospitalization: No - Comment as needed  Activities of Daily Living Home Assistive Devices/Equipment: Other (Comment)(Peritoneal Dialysis) ADL Screening  (condition at time of admission) Patient's cognitive ability adequate to safely complete daily activities?: Yes Is the patient deaf or have difficulty hearing?: No Does the patient have difficulty seeing, even when wearing glasses/contacts?: No Does the patient have difficulty concentrating, remembering, or making decisions?: No Patient able to express need for assistance with ADLs?: Yes Does the patient have difficulty dressing or bathing?: No Independently performs ADLs?: Yes (appropriate for developmental age) Does the patient have difficulty walking or climbing stairs?: No Weakness of Legs: None Weakness of Arms/Hands: None  Permission Sought/Granted                  Emotional Assessment Appearance:: Appears stated age     Orientation: : Oriented to Self, Oriented to Place, Oriented to  Time, Oriented to Situation   Psych Involvement: No (comment)  Admission diagnosis:  PVD (peripheral vascular disease) ([place]) [I73.9] ESRD (end stage renal disease) ([place]) [N18.6] Gangrene of toe ([place]) [I96] Sepsis due to cellulitis ([place]) [L03.90, A41.9] Patient Active Problem List   Diagnosis Date Noted  . Toe infection 05/22/2019  . Acute respiratory failure with hypoxia ([place]) 09/17/2018  . History of colonic polyps   . Positive fecal occult blood test   . End stage renal disease ([place]) 11/25/2016  . Tobacco abuse 11/25/2016  . CHF (congestive heart failure) ([place]) 11/13/2016  . Diabetes ([place]) 09/17/2015  . Decreased renal function 05/23/2015  .  GERD (gastroesophageal reflux disease) 04/04/2015  . Hypertension 02/26/2015   PCP:  [employer] [employer], [place]:   [place], [address] [place] [address] Phone: [phone] Fax: [phone]     Social Determinants of Health (SDOH) Interventions    Readmission Risk Interventions No flowsheet data found.

## 2019-05-26 NOTE — Progress Notes (Signed)
05/26/2019 1:45 PM  [name] to be D/C'd Home per MD order.  Discussed prescriptions and follow up appointments with the patient. Prescriptions given to patient, medication list explained in detail. Pt verbalized understanding.  Allergies as of 05/26/2019   No Known Allergies     Medication List    STOP taking these medications   prasugrel 10 MG Tabs tablet Commonly known as: EFFIENT     TAKE these medications   albuterol 108 (90 Base) MCG/ACT inhaler Commonly known as: VENTOLIN HFA Inhale 2 puffs into the lungs every 6 (six) hours as needed for wheezing or shortness of breath.   amLODipine 10 MG tablet Commonly known as: NORVASC Take 10 mg by mouth daily.   amoxicillin-clavulanate 500-125 MG tablet Commonly known as: AUGMENTIN Take 1 tablet (500 mg total) by mouth every 12 (twelve) hours for 7 days.   aspirin EC 81 MG tablet Take 1 tablet (81 mg total) by mouth daily.   atorvastatin 40 MG tablet Commonly known as: LIPITOR Take 40 mg by mouth daily.   calcitRIOL 0.25 MCG capsule Commonly known as: ROCALTROL Take 0.25 mcg by mouth daily.   calcium acetate 667 MG capsule Commonly known as: PHOSLO Take 2,001 mg by mouth 3 (three) times daily with meals. Takes 1-2 caps if he has snacks   carvedilol 25 MG tablet Commonly known as: COREG Take 1 tablet (25 mg total) by mouth 2 (two) times daily with a meal.   clopidogrel 75 MG tablet Commonly known as: PLAVIX Take 1 tablet (75 mg total) by mouth daily with breakfast. Start taking on: May 27, 2019   Dialyvite 800 0.8 MG Tabs Take 1 tablet by mouth daily.   furosemide 80 MG tablet Commonly known as: LASIX Take 80 mg by mouth daily.   gentamicin cream 0.1 % Commonly known as: GARAMYCIN Apply 1 application topically daily.   hydrALAZINE 25 MG tablet Commonly known as: APRESOLINE Take 1 tablet (25 mg total) by mouth 3 (three) times daily. What changed: additional instructions    HYDROcodone-acetaminophen 5-325 MG tablet Commonly known as: NORCO/VICODIN Take 1-2 tablets by mouth every 6 (six) hours as needed for moderate pain. What changed: when to take this   insulin aspart protamine- aspart (70-30) 100 UNIT/ML injection Commonly known as: NOVOLOG MIX 70/30 Inject 0.25 mLs (25 Units total) into the skin 2 (two) times daily with a meal. What changed: how much to take   nitroGLYCERIN 0.4 MG SL tablet Commonly known as: NITROSTAT Place 0.4 mg under the tongue every 5 (five) minutes as needed for chest pain. Pt needs new Rx. Bottle has expried   omeprazole 20 MG capsule Commonly known as: PRILOSEC Take 1 capsule (20 mg total) by mouth daily.   ONE TOUCH ULTRA TEST test strip Generic drug: glucose blood   Vitamin D 50 MCG (2000 UT) tablet Take 2,000 Units by mouth daily. Unsure how many units       Vitals:   05/26/19 0755 05/26/19 1133  BP: 127/73 (!) 160/75  Pulse: 67 73  Resp: 20 18  Temp: 98.7 F (37.1 C) 98 F (36.7 C)  SpO2: 94% 95%    Skin clean, dry and intact without evidence of skin break down, no evidence of skin tears noted. IV catheter discontinued intact. Site without signs and symptoms of complications. Dressing and pressure applied. Pt denies pain at this time. No complaints noted.  An After Visit Summary was printed and given to the patient. Patient escorted via [place],  and D/C home via private auto.  Scott [name]

## 2019-05-26 NOTE — Progress Notes (Signed)
[facility]  ROUNDING NOTE   Subjective:  Patient with good ultrafiltration. Ultrafiltration achieved was 855 cc using 2.5% dextrose solution. Minimal postoperative pain.  Objective:  Vital signs in last 24 hours:  Temp:  [98 F (36.7 C)-98.7 F (37.1 C)] 98.7 F (37.1 C) (09/25 0755) Pulse Rate:  [64-69] 67 (09/25 0755) Resp:  [18-20] 20 (09/25 0755) BP: (91-129)/(41-73) 127/73 (09/25 0755) SpO2:  [91 %-98 %] 94 % (09/25 0755) Weight:  [96.7 kg] 96.7 kg (09/25 0420)  Weight change:  Filed Weights   05/22/19 1509 05/23/19 1000 05/26/19 0420  Weight: 90.7 kg 90.7 kg 96.7 kg    Intake/Output: I/O last 3 completed shifts: In: 1103.8 [P.O.:840; IV Piggyback:263.8] Out: 958 [Urine:100; Other:858]   Intake/Output this shift:  Total I/O In: -  Out: 855 [Other:855]  Physical Exam: General: No acute distress  Head: Normocephalic, atraumatic. Moist oral mucosal membranes  Eyes: Anicteric  Neck: Supple, trachea midline  Lungs:  Clear to auscultation, normal effort  Heart: S1S2 no rubs  Abdomen:  Soft, nontender, bowel sounds present  Extremities: trace peripheral edema.  Neurologic: Awake, alert, following commands  Skin: No lesions  Access: PD catheter in place    Basic Metabolic Panel: Recent Labs  Lab 05/22/19 1511 05/22/19 1831 05/23/19 0524 05/24/19 0541 05/25/19 0459 05/26/19 0417  NA 137  --  137 137 137 136  K 3.4*  --  3.5 3.6 3.5 3.3*  CL 99  --  103 100 97* 99  CO2 21*  --  20* 21* 21* 21*  GLUCOSE 189*  --  114* 171* 190* 108*  BUN 61*  --  65* 66* 63* 59*  CREATININE 8.72* 8.89* 9.05* 8.97* 8.99* 9.38*  CALCIUM 7.4*  --  7.2* 7.8* 7.7* 7.7*  PHOS  --   --   --   --  8.7*  --     Liver Function Tests: Recent Labs  Lab 05/22/19 1511 05/25/19 0459  AST 30  --   ALT 34  --   ALKPHOS 139*  --   BILITOT 0.5  --   PROT 6.8  --   ALBUMIN 2.5* 2.2*   No results for input(s): LIPASE, AMYLASE in the last 168 hours. No results for  input(s): AMMONIA in the last 168 hours.  CBC: Recent Labs  Lab 05/22/19 1511 05/23/19 0524 05/24/19 0541 05/25/19 0459 05/26/19 0417  WBC 23.4* 15.9* 14.0* 13.9* 15.0*  NEUTROABS 20.5*  --   --   --   --   HGB 8.1* 7.2* 6.9* 7.5* 7.7*  HCT 24.7* 22.0* 21.3* 23.4* 24.4*  MCV 95.4 94.8 95.5 95.9 96.1  PLT 398 336 388 401* 398    Cardiac Enzymes: No results for input(s): CKTOTAL, CKMB, CKMBINDEX, TROPONINI in the last 168 hours.  BNP: Invalid input(s): POCBNP  CBG: Recent Labs  Lab 05/25/19 1233 05/25/19 1304 05/25/19 1713 05/25/19 2039 05/26/19 0733  GLUCAP 53* 182* 104* 221* 120*    Microbiology: Results for orders placed or performed during the hospital encounter of 05/22/19  Blood culture (routine x 2)     Status: None (Preliminary result)   Collection Time: 05/22/19  3:37 PM   Specimen: BLOOD  Result Value Ref Range Status   Specimen Description BLOOD LEFT ANTECUBITAL  Final   Special Requests Blood Culture adequate volume  Final   Culture   Final    NO GROWTH 4 DAYS Performed at [facility], [address] [place], [place] [address]  Report Status PENDING  Incomplete  Blood culture (routine x 2)     Status: None (Preliminary result)   Collection Time: 05/22/19  3:42 PM   Specimen: BLOOD  Result Value Ref Range Status   Specimen Description BLOOD BLOOD LEFT FOREARM  Final   Special Requests Blood Culture adequate volume  Final   Culture   Final    NO GROWTH 4 DAYS Performed at [facility], [address], [place], [place] [address]    Report Status PENDING  Incomplete  SARS Coronavirus 2 [facility] order, Performed in [facility] hospital lab) Nasopharyngeal Nasopharyngeal Swab     Status: None   Collection Time: 05/22/19  3:53 PM   Specimen: Nasopharyngeal Swab  Result Value Ref Range Status   SARS Coronavirus 2 NEGATIVE NEGATIVE Final    Comment: (NOTE) If result is NEGATIVE SARS-CoV-2 target nucleic acids are NOT  DETECTED. The SARS-CoV-2 RNA is generally detectable in upper and lower  respiratory specimens during the acute phase of infection. The lowest  concentration of SARS-CoV-2 viral copies this assay can detect is 250  copies / mL. A negative result does not preclude SARS-CoV-2 infection  and should not be used as the sole basis for treatment or other  patient management decisions.  A negative result may occur with  improper specimen collection / handling, submission of specimen other  than nasopharyngeal swab, presence of viral mutation(s) within the  areas targeted by this assay, and inadequate number of viral copies  (<250 copies / mL). A negative result must be combined with clinical  observations, patient history, and epidemiological information. If result is POSITIVE SARS-CoV-2 target nucleic acids are DETECTED. The SARS-CoV-2 RNA is generally detectable in upper and lower  respiratory specimens dur ing the acute phase of infection.  Positive  results are indicative of active infection with SARS-CoV-2.  Clinical  correlation with patient history and other diagnostic information is  necessary to determine patient infection status.  Positive results do  not rule out bacterial infection or co-infection with other viruses. If result is PRESUMPTIVE POSTIVE SARS-CoV-2 nucleic acids MAY BE PRESENT.   A presumptive positive result was obtained on the submitted specimen  and confirmed on repeat testing.  While 2019 novel coronavirus  (SARS-CoV-2) nucleic acids may be present in the submitted sample  additional confirmatory testing may be necessary for epidemiological  and / or clinical management purposes  to differentiate between  SARS-CoV-2 and other Sarbecovirus currently known to infect humans.  If clinically indicated additional testing with an alternate test  methodology [phone]) is advised. The SARS-CoV-2 RNA is generally  detectable in upper and lower respiratory sp ecimens during  the acute  phase of infection. The expected result is Negative. Fact Sheet for Patients:  StrictlyIdeas.no Fact Sheet for Healthcare Providers: BankingDealers.co.za This test is not yet approved or cleared by the [place] FDA and has been authorized for detection and/or diagnosis of SARS-CoV-2 by FDA under an Emergency Use Authorization (EUA).  This EUA will remain in effect (meaning this test can be used) for the duration of the COVID-19 declaration under Section 564(b)(1) of the Act, 21 U.S.C. section 360bbb-3(b)(1), unless the authorization is terminated or revoked sooner. Performed at [facility], [address], [place], [place] [address]   Surgical pcr screen     Status: None   Collection Time: 05/23/19  5:04 PM   Specimen: Nasal Mucosa; Nasal Swab  Result Value Ref Range Status   MRSA, PCR NEGATIVE NEGATIVE  Final   Staphylococcus aureus NEGATIVE NEGATIVE Final    Comment: (NOTE) The Xpert SA Assay (FDA approved for NASAL specimens in patients 31 years of age and older), is one component of a comprehensive surveillance program. It is not intended to diagnose infection nor to guide or monitor treatment. Performed at [facility], [address], [place], [place] [address]   Aerobic/Anaerobic Culture (surgical/deep wound)     Status: None (Preliminary result)   Collection Time: 05/24/19  1:22 PM   Specimen: PATH Other; Tissue  Result Value Ref Range Status   Specimen Description   Final    TOE LEFT GREAT TOE WOUND CULTURE Performed at [facility], [address], [place], [place] [address]    Special Requests   Final    NONE Performed at [facility], [address] [place], [place] [address]    Gram Stain   Final    RARE WBC PRESENT, PREDOMINANTLY PMN NO ORGANISMS SEEN    Culture   Final    FEW GRAM NEGATIVE RODS CULTURE REINCUBATED FOR BETTER GROWTH Performed at [facility], [address] [place], [place] [address]    Report Status PENDING  Incomplete    Coagulation Studies: No results for input(s): LABPROT, INR in the last 72 hours.  Urinalysis: No results for input(s): COLORURINE, LABSPEC, PHURINE, GLUCOSEU, HGBUR, BILIRUBINUR, KETONESUR, PROTEINUR, UROBILINOGEN, NITRITE, LEUKOCYTESUR in the last 72 hours.  Invalid input(s): APPERANCEUR    Imaging: Dg Foot 2 Views Left  Result Date: 05/24/2019 CLINICAL DATA:  Status post left partial first toe amputation, right third and fourth metatarsophalangeal ulceration probes close to bone EXAM: LEFT FOOT - 2 VIEW; RIGHT FOOT - 2 VIEW COMPARISON:  None. FINDINGS: Status post transmetatarsal amputation of the left first digit with overlying postoperative soft tissue changes and bandage material. No fracture or dislocation. Joint spaces are well preserved. Large plantar calcaneal spur. Extensive vascular calcinosis about the foot and ankle. Diffuse soft tissue edema of the lower leg and foot. Status post distal transmetatarsal amputation of the right fourth digit. There has been resection of the distal right third metatarsal with the digit remaining. There is no obvious bony erosion or sclerosis appreciated. The joint spaces are well preserved. Large plantar calcaneal spur. Extensive vascular calcinosis about the foot and ankle. Bandage material about the ball of the foot. IMPRESSION: 1. Status post transmetatarsal amputation of the left first digit with overlying postoperative soft tissue changes and bandage material. No fracture or dislocation. 2. Status post distal transmetatarsal amputation of the right fourth digit. There has been resection of the distal right third metatarsal with the digit remaining. There is no obvious bony erosion or sclerosis appreciated. MRI is the test of choice to assess for bone marrow edema and osteomyelitis if suspected. 3. Extensive vascular calcinosis about the bilateral feet and  ankles. Electronically Signed   By: Eddie Candle M.D.   On: 05/24/2019 15:46   Dg Foot 2 Views Right  Result Date: 05/24/2019 CLINICAL DATA:  Status post left partial first toe amputation, right third and fourth metatarsophalangeal ulceration probes close to bone EXAM: LEFT FOOT - 2 VIEW; RIGHT FOOT - 2 VIEW COMPARISON:  None. FINDINGS: Status post transmetatarsal amputation of the left first digit with overlying postoperative soft tissue changes and bandage material. No fracture or dislocation. Joint spaces are well preserved. Large plantar calcaneal spur. Extensive vascular calcinosis about the foot and ankle. Diffuse soft tissue edema of the lower leg and  foot. Status post distal transmetatarsal amputation of the right fourth digit. There has been resection of the distal right third metatarsal with the digit remaining. There is no obvious bony erosion or sclerosis appreciated. The joint spaces are well preserved. Large plantar calcaneal spur. Extensive vascular calcinosis about the foot and ankle. Bandage material about the ball of the foot. IMPRESSION: 1. Status post transmetatarsal amputation of the left first digit with overlying postoperative soft tissue changes and bandage material. No fracture or dislocation. 2. Status post distal transmetatarsal amputation of the right fourth digit. There has been resection of the distal right third metatarsal with the digit remaining. There is no obvious bony erosion or sclerosis appreciated. MRI is the test of choice to assess for bone marrow edema and osteomyelitis if suspected. 3. Extensive vascular calcinosis about the bilateral feet and ankles. Electronically Signed   By: Eddie Candle M.D.   On: 05/24/2019 15:46     Medications:   . sodium chloride 30 mL (05/24/19 0835)  . dialysis solution 2.5% low-MG/low-CA     . sodium chloride   Intravenous Once  . amoxicillin-clavulanate  1 tablet Oral Q12H  . atorvastatin  10 mg Oral q1800  . calcitRIOL  0.25  mcg Oral Daily  . calcium acetate  2,001 mg Oral TID WC  . carvedilol  25 mg Oral BID WC  . Chlorhexidine Gluconate Cloth  6 each Topical Daily  . cholecalciferol  2,000 Units Oral Daily  . clopidogrel  75 mg Oral Q breakfast  . furosemide  80 mg Oral Daily  . gentamicin cream  1 application Topical Daily  . heparin  5,000 Units Subcutaneous Q8H  . hydrALAZINE  25 mg Oral Daily  . insulin aspart  0-5 Units Subcutaneous QHS  . insulin aspart  0-9 Units Subcutaneous TID WC  . insulin aspart protamine- aspart  12 Units Subcutaneous BID WC  . multivitamin  1 tablet Oral Daily  . pantoprazole  40 mg Oral Daily  . sodium chloride flush  3 mL Intravenous Q12H   sodium chloride, [DISCONTINUED] acetaminophen **OR** acetaminophen, acetaminophen, albuterol, bisacodyl, HYDROcodone-acetaminophen, morphine injection, nitroGLYCERIN, ondansetron (ZOFRAN) IV, ondansetron **OR** [DISCONTINUED] ondansetron (ZOFRAN) IV, oxyCODONE, senna-docusate, sodium chloride flush  Assessment/ Plan:  58 y.o. male with end stage renal disease on peritoneal dialysis, congestive heart failure, GERD, hypertension, peripheral vascular disease, diabetes mellitus type 2, diabetic retinopathy, diabetic gastroparesis, admitted with gangrene of left first toe.  [facility]  CAPD 1841mL fills 4 exchanges   1.  ESRD on PD. 2.  Gangrene of left first toe. 3.  Anemia of chronic kidney disease. 4.  Secondary hyperparathyroidism. 5.  Hypertension.  Plan: Patient continues to do well with CCPD.  Continue CCPD if the patient remains here tonight.  Ultrafiltration achieved was 855 cc per nursing report.  Patient continues to be interested in peritoneal dialysis using a cycler as an outpatient.  We advised him to discuss this further with our outpatient PD team.  Patient verbalized understanding of this.  LOS: 4 Scott [name] 9/25/202010:50 AM

## 2019-05-26 NOTE — Discharge Summary (Signed)
[facility] - [place] at [facility], [place] y.o., DOB 1961/01/12, MRN [id number]. 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  He was admitted for dry gangrene of the left hallux.  Patient was seen in consultation by podiatry and underwent surgery with partial ray amputation.  Patient also had right plantar third MPJ subcutaneous diabetic foot ulceration that was incised and cleaned.  Patient also during hospitalization was noted to have a drop in his hemoglobin.  He is chronically anemic.  He required 1 unit of packed RBCs.  Patient is currently doing well and stable for discharge from podiatry standpoint          Consults  podiatry  Significant Tests:  See full reports for all details     Dg Foot 2 Views Left  Result Date: 05/24/2019 CLINICAL DATA:  Status post left partial first toe amputation, right third and fourth metatarsophalangeal ulceration probes close to bone EXAM: LEFT FOOT - 2 VIEW; RIGHT FOOT - 2 VIEW COMPARISON:  None. FINDINGS: Status post transmetatarsal amputation of the left first digit with  overlying postoperative soft tissue changes and bandage material. No fracture or dislocation. Joint spaces are well preserved. Large plantar calcaneal spur. Extensive vascular calcinosis about the foot and ankle. Diffuse soft tissue edema of the lower leg and foot. Status post distal transmetatarsal amputation of the right fourth digit. There has been resection of the distal right third metatarsal with the digit remaining. There is no obvious bony erosion or sclerosis appreciated. The joint spaces are well preserved. Large plantar calcaneal spur. Extensive vascular calcinosis about the foot and ankle. Bandage material about the ball of the foot. IMPRESSION: 1. Status post transmetatarsal amputation of the left first digit with overlying postoperative soft tissue changes and bandage material. No fracture or dislocation. 2. Status post distal transmetatarsal amputation of the right fourth digit. There has been resection of the distal right third metatarsal with the digit remaining. There is no obvious bony erosion or sclerosis appreciated. MRI is the test of choice to assess for bone marrow edema and osteomyelitis if suspected. 3. Extensive vascular calcinosis about the bilateral feet and ankles. Electronically Signed   By: Eddie Candle M.D.   On: 05/24/2019 15:46   Dg Foot 2 Views Right  Result Date: 05/24/2019 CLINICAL DATA:  Status post left partial first toe amputation, right third and fourth metatarsophalangeal ulceration probes close to bone EXAM: LEFT FOOT - 2 VIEW; RIGHT FOOT - 2 VIEW COMPARISON:  None. FINDINGS: Status post transmetatarsal amputation of the left first digit with overlying postoperative soft tissue changes and bandage material. No fracture or dislocation. Joint spaces are well preserved. Large plantar calcaneal spur. Extensive vascular calcinosis about the foot and ankle. Diffuse soft tissue edema of the lower leg  and foot. Status post distal transmetatarsal amputation of the right fourth  digit. There has been resection of the distal right third metatarsal with the digit remaining. There is no obvious bony erosion or sclerosis appreciated. The joint spaces are well preserved. Large plantar calcaneal spur. Extensive vascular calcinosis about the foot and ankle. Bandage material about the ball of the foot. IMPRESSION: 1. Status post transmetatarsal amputation of the left first digit with overlying postoperative soft tissue changes and bandage material. No fracture or dislocation. 2. Status post distal transmetatarsal amputation of the right fourth digit. There has been resection of the distal right third metatarsal with the digit remaining. There is no obvious bony erosion or sclerosis appreciated. MRI is the test of choice to assess for bone marrow edema and osteomyelitis if suspected. 3. Extensive vascular calcinosis about the bilateral feet and ankles. Electronically Signed   By: Eddie Candle M.D.   On: 05/24/2019 15:46   Dg Foot Complete Left  Result Date: 05/22/2019 CLINICAL DATA:  Left great toe pain and redness EXAM: LEFT FOOT - COMPLETE 3+ VIEW COMPARISON:  None. FINDINGS: Obliquely oriented lucency involving the dorsal cortex of the great toe distal phalanx seen only on lateral view suggestive of a nondisplaced fracture. No definite corresponding abnormality on AP or oblique views. No definite cortical destruction or periostitis appreciated. Mild degenerative changes of the first MTP and IP joints of the great toe. Prominent plantar calcaneal spur. Severe vascular calcifications. Mild soft tissue irregularity along the medial soft tissues at the level of the great toe IP joint. IMPRESSION: 1. Findings suspicious for nondisplaced fracture of the great toe distal phalanx. Consider further evaluation with dedicated radiographs of the digit as clinically indicated. 2. No specific radiographic evidence of acute osteomyelitis. Electronically Signed   By: Davina Poke M.D.   On: 05/22/2019  16:16       Today   Subjective:   Scott [name]   patient doing well pain is improved.  Objective:   Blood pressure (!) 160/75, pulse 73, temperature 98 F (36.7 C), temperature source Oral, resp. rate 18, height 5\' 10"  (1.778 m), weight 96.7 kg, SpO2 95 %.  .  Intake/Output Summary (Last 24 hours) at 05/26/2019 1308 Last data filed at 05/26/2019 0900 Gross per 24 hour  Intake 240 ml  Output 855 ml  Net -615 ml    Exam VITAL SIGNS: Blood pressure (!) 160/75, pulse 73, temperature 98 F (36.7 C), temperature source Oral, resp. rate 18, height 5\' 10"  (1.778 m), weight 96.7 kg, SpO2 95 %.  GENERAL:  58 y.o.-year-old patient lying in the bed with no acute distress.  EYES: Pupils equal, round, reactive to light and accommodation. No scleral icterus. Extraocular muscles intact.  HEENT: Head atraumatic, normocephalic. Oropharynx and nasopharynx clear.  NECK:  Supple, no jugular venous distention. No thyroid enlargement, no tenderness.  LUNGS: Normal breath sounds bilaterally, no wheezing, rales,rhonchi or crepitation. No use of accessory muscles of respiration.  CARDIOVASCULAR: S1, S2 normal. No murmurs, rubs, or gallops.  ABDOMEN: Soft, nontender, nondistended. Bowel sounds present. No organomegaly or mass.  EXTREMITIES: Left foot dressing in place NEUROLOGIC: Cranial nerves II through XII are intact. Muscle strength 5/5 in all extremities. Sensation intact. Gait not checked.  PSYCHIATRIC: The patient is alert and oriented x 3.  SKIN: No obvious rash, lesion, or ulcer.   Data Review     CBC w Diff:  Lab Results  Component Value Date   WBC 15.0 (H) 05/26/2019   HGB  7.7 (L) 05/26/2019   HGB 10.7 (L) 12/13/2014   HCT 24.4 (L) 05/26/2019   HCT 33.8 (L) 12/13/2014   PLT 398 05/26/2019   PLT 510 (H) 12/13/2014   LYMPHOPCT 3 05/22/2019   LYMPHOPCT 25.4 12/04/2014   MONOPCT 6 05/22/2019   MONOPCT 8.3 12/04/2014   EOSPCT 1 05/22/2019   EOSPCT 5.0 12/04/2014   BASOPCT 0  05/22/2019   BASOPCT 0.8 12/04/2014   CMP:  Lab Results  Component Value Date   NA 136 05/26/2019   NA 140 04/16/2015   NA 140 12/13/2014   K 3.3 (L) 05/26/2019   K 4.6 12/13/2014   CL 99 05/26/2019   CL 111 12/13/2014   CO2 21 (L) 05/26/2019   CO2 23 12/13/2014   BUN 59 (H) 05/26/2019   BUN 44 (A) 04/16/2015   BUN 25 (H) 12/13/2014   CREATININE 9.38 (H) 05/26/2019   CREATININE 1.95 (H) 12/13/2014   GLU 218 04/16/2015   PROT 6.8 05/22/2019   PROT 7.2 12/13/2014   ALBUMIN 2.2 (L) 05/25/2019   ALBUMIN 2.8 (L) 12/13/2014   BILITOT 0.5 05/22/2019   BILITOT 0.3 12/13/2014   ALKPHOS 139 (H) 05/22/2019   ALKPHOS 149 (H) 12/13/2014   AST 30 05/22/2019   AST 19 12/13/2014   ALT 34 05/22/2019   ALT 13 (L) 12/13/2014  .  Micro Results Recent Results (from the past 240 hour(s))  Blood culture (routine x 2)     Status: None (Preliminary result)   Collection Time: 05/22/19  3:37 PM   Specimen: BLOOD  Result Value Ref Range Status   Specimen Description BLOOD LEFT ANTECUBITAL  Final   Special Requests Blood Culture adequate volume  Final   Culture   Final    NO GROWTH 4 DAYS Performed at [facility], [address] [place], [place] [address]    Report Status PENDING  Incomplete  Blood culture (routine x 2)     Status: None (Preliminary result)   Collection Time: 05/22/19  3:42 PM   Specimen: BLOOD  Result Value Ref Range Status   Specimen Description BLOOD BLOOD LEFT FOREARM  Final   Special Requests Blood Culture adequate volume  Final   Culture   Final    NO GROWTH 4 DAYS Performed at [facility], [address], [place], [place] [address]    Report Status PENDING  Incomplete  SARS Coronavirus 2 [facility] order, Performed in [place] hospital lab) Nasopharyngeal Nasopharyngeal Swab     Status: None   Collection Time: 05/22/19  3:53 PM   Specimen: Nasopharyngeal Swab  Result Value Ref Range Status   SARS Coronavirus 2 NEGATIVE NEGATIVE Final     Comment: (NOTE) If result is NEGATIVE SARS-CoV-2 target nucleic acids are NOT DETECTED. The SARS-CoV-2 RNA is generally detectable in upper and lower  respiratory specimens during the acute phase of infection. The lowest  concentration of SARS-CoV-2 viral copies this assay can detect is 250  copies / mL. A negative result does not preclude SARS-CoV-2 infection  and should not be used as the sole basis for treatment or other  patient management decisions.  A negative result may occur with  improper specimen collection / handling, submission of specimen other  than nasopharyngeal swab, presence of viral mutation(s) within the  areas targeted by this assay, and inadequate number of viral copies  (<250 copies / mL). A negative result must be combined with clinical  observations, patient history, and epidemiological information. If result  is POSITIVE SARS-CoV-2 target nucleic acids are DETECTED. The SARS-CoV-2 RNA is generally detectable in upper and lower  respiratory specimens dur ing the acute phase of infection.  Positive  results are indicative of active infection with SARS-CoV-2.  Clinical  correlation with patient history and other diagnostic information is  necessary to determine patient infection status.  Positive results do  not rule out bacterial infection or co-infection with other viruses. If result is PRESUMPTIVE POSTIVE SARS-CoV-2 nucleic acids MAY BE PRESENT.   A presumptive positive result was obtained on the submitted specimen  and confirmed on repeat testing.  While 2019 novel coronavirus  (SARS-CoV-2) nucleic acids may be present in the submitted sample  additional confirmatory testing may be necessary for epidemiological  and / or clinical management purposes  to differentiate between  SARS-CoV-2 and other Sarbecovirus currently known to infect humans.  If clinically indicated additional testing with an alternate test  methodology [phone]) is advised. The  SARS-CoV-2 RNA is generally  detectable in upper and lower respiratory sp ecimens during the acute  phase of infection. The expected result is Negative. Fact Sheet for Patients:  StrictlyIdeas.no Fact Sheet for Healthcare Providers: BankingDealers.co.za This test is not yet approved or cleared by the [place] FDA and has been authorized for detection and/or diagnosis of SARS-CoV-2 by FDA under an Emergency Use Authorization (EUA).  This EUA will remain in effect (meaning this test can be used) for the duration of the COVID-19 declaration under Section 564(b)(1) of the Act, 21 U.S.C. section 360bbb-3(b)(1), unless the authorization is terminated or revoked sooner. Performed at [facility], [address], [place], [place] [address]   Surgical pcr screen     Status: None   Collection Time: 05/23/19  5:04 PM   Specimen: Nasal Mucosa; Nasal Swab  Result Value Ref Range Status   MRSA, PCR NEGATIVE NEGATIVE Final   Staphylococcus aureus NEGATIVE NEGATIVE Final    Comment: (NOTE) The Xpert SA Assay (FDA approved for NASAL specimens in patients 26 years of age and older), is one component of a comprehensive surveillance program. It is not intended to diagnose infection nor to guide or monitor treatment. Performed at [facility], [address], [place], [place] [address]   Aerobic/Anaerobic Culture (surgical/deep wound)     Status: None (Preliminary result)   Collection Time: 05/24/19  1:22 PM   Specimen: PATH Other; Tissue  Result Value Ref Range Status   Specimen Description   Final    TOE LEFT GREAT TOE WOUND CULTURE Performed at [facility], [address], [place], [place] [address]    Special Requests   Final    NONE Performed at [facility], [address] [place], [place] [address]    Gram Stain   Final    RARE WBC PRESENT, PREDOMINANTLY PMN NO ORGANISMS SEEN    Culture    Final    FEW GRAM NEGATIVE RODS IDENTIFICATION AND SUSCEPTIBILITIES TO FOLLOW Performed at [facility], [place] [address], [place], [place] [address]    Report Status PENDING  Incomplete        Code Status Orders  (From admission, onward)         Start     Ordered   05/22/19 1831  Full code  Continuous     05/22/19 1830        Code Status History    Date Active Date Inactive Code Status Order ID Comments User Context   09/18/2018  0022 09/19/2018 1608 Full Code [id number]  MayoPete Pelt, MD ED   03/09/2018 1206 03/12/2018 1712 Full Code [id number]  Henreitta Leber, MD Inpatient   01/17/2017 0338 01/20/2017 1834 Full Code [id number]  Harrie Foreman, MD ED   11/13/2016 1013 11/20/2016 1256 Full Code [id number]  Hillary Bow, MD ED   01/09/2015 1019 01/10/2015 1518 Full Code [id number]  Max Sane, MD Inpatient   12/18/2014 1620 12/19/2014 1627 Full Code [id number]  Charolette Forward, MD Inpatient   12/14/2014 1600 12/18/2014 1620 Full Code [id number]  Charolette Forward, MD Inpatient   12/13/2014 1324 12/14/2014 1600 Full Code [id number]  Charolette Forward, MD Inpatient   Advance Care Planning Activity          Follow-up Information    Schnier, [name], MD. Go on 06/26/2019.   Specialties: Vascular Surgery, Cardiology, Radiology, Vascular Surgery Why: Can see Schnier or Arna Medici. Will need ABI's. appointment time is 1pm Contact information: [place] [address] [phone]        Caroline More, DPM. Go on 05/31/2019.   Specialty: Podiatry Why: 11 am appointment Contact information: [place] [address] [place], [employer]. Call on 06/01/2019.   Why: hosp f/u appointment time 1:40 if not able come use the tablet in the parking lot of the doctors office Contact information: [place] [address] [phone]           Discharge Medications   Allergies as of 05/26/2019   No Known  Allergies     Medication List    STOP taking these medications   prasugrel 10 MG Tabs tablet Commonly known as: EFFIENT     TAKE these medications   albuterol 108 (90 Base) MCG/ACT inhaler Commonly known as: VENTOLIN HFA Inhale 2 puffs into the lungs every 6 (six) hours as needed for wheezing or shortness of breath.   amLODipine 10 MG tablet Commonly known as: NORVASC Take 10 mg by mouth daily.   amoxicillin-clavulanate 500-125 MG tablet Commonly known as: AUGMENTIN Take 1 tablet (500 mg total) by mouth every 12 (twelve) hours for 7 days.   aspirin EC 81 MG tablet Take 1 tablet (81 mg total) by mouth daily.   atorvastatin 40 MG tablet Commonly known as: LIPITOR Take 40 mg by mouth daily.   calcitRIOL 0.25 MCG capsule Commonly known as: ROCALTROL Take 0.25 mcg by mouth daily.   calcium acetate 667 MG capsule Commonly known as: PHOSLO Take 2,001 mg by mouth 3 (three) times daily with meals. Takes 1-2 caps if he has snacks   carvedilol 25 MG tablet Commonly known as: COREG Take 1 tablet (25 mg total) by mouth 2 (two) times daily with a meal.   clopidogrel 75 MG tablet Commonly known as: PLAVIX Take 1 tablet (75 mg total) by mouth daily with breakfast. Start taking on: May 27, 2019   Dialyvite 800 0.8 MG Tabs Take 1 tablet by mouth daily.   furosemide 80 MG tablet Commonly known as: LASIX Take 80 mg by mouth daily.   gentamicin cream 0.1 % Commonly known as: GARAMYCIN Apply 1 application topically daily.   hydrALAZINE 25 MG tablet Commonly known as: APRESOLINE Take 1 tablet (25 mg total) by mouth 3 (three) times daily. What changed: additional instructions   HYDROcodone-acetaminophen 5-325 MG tablet Commonly known as: NORCO/VICODIN Take 1-2 tablets by mouth every 6 (six) hours as needed for moderate pain. What changed:  when to take this   insulin aspart protamine- aspart (70-30) 100 UNIT/ML injection Commonly known as: NOVOLOG MIX 70/30 Inject  0.25 mLs (25 Units total) into the skin 2 (two) times daily with a meal. What changed: how much to take   nitroGLYCERIN 0.4 MG SL tablet Commonly known as: NITROSTAT Place 0.4 mg under the tongue every 5 (five) minutes as needed for chest pain. Pt needs new Rx. Bottle has expried   omeprazole 20 MG capsule Commonly known as: PRILOSEC Take 1 capsule (20 mg total) by mouth daily.   ONE TOUCH ULTRA TEST test strip Generic drug: glucose blood   Vitamin D 50 MCG (2000 UT) tablet Take 2,000 Units by mouth daily. Unsure how many units          Total Time in preparing paper work, data evaluation and todays exam - 14 minutes  Dustin Flock M.D on 05/26/2019 at [place]  [phone]

## 2019-05-26 NOTE — Anesthesia Postprocedure Evaluation (Signed)
Anesthesia Post Note  Patient: [name]  Procedure(s) Performed: Left great toe amputation (Left Toe)  Patient location during evaluation: PACU Anesthesia Type: General Level of consciousness: awake and alert and oriented Pain management: pain level controlled Vital Signs Assessment: post-procedure vital signs reviewed and stable Respiratory status: spontaneous breathing, nonlabored ventilation and respiratory function stable Cardiovascular status: blood pressure returned to baseline and stable Postop Assessment: no signs of nausea or vomiting Anesthetic complications: no     Last Vitals:  Vitals:   05/25/19 2015 05/26/19 0507  BP: (!) 129/48 (!) 113/47  Pulse: 64 69  Resp: 18 18  Temp: 36.7 C 36.7 C  SpO2: 97% 91%    Last Pain:  Vitals:   05/26/19 0507  TempSrc: Oral  PainSc:                  [name]

## 2019-05-27 LAB — CULTURE, BLOOD (ROUTINE X 2)
Culture: NO GROWTH
Culture: NO GROWTH
Special Requests: ADEQUATE
Special Requests: ADEQUATE

## 2019-05-31 LAB — AEROBIC/ANAEROBIC CULTURE W GRAM STAIN (SURGICAL/DEEP WOUND)

## 2019-06-07 ENCOUNTER — Other Ambulatory Visit (INDEPENDENT_AMBULATORY_CARE_PROVIDER_SITE_OTHER): Payer: Self-pay | Admitting: Nurse Practitioner

## 2019-06-07 ENCOUNTER — Other Ambulatory Visit (INDEPENDENT_AMBULATORY_CARE_PROVIDER_SITE_OTHER): Payer: Self-pay | Admitting: Vascular Surgery

## 2019-06-07 DIAGNOSIS — I70262 Atherosclerosis of native arteries of extremities with gangrene, left leg: Secondary | ICD-10-CM

## 2019-06-07 DIAGNOSIS — Z9862 Peripheral vascular angioplasty status: Secondary | ICD-10-CM

## 2019-06-08 ENCOUNTER — Encounter (INDEPENDENT_AMBULATORY_CARE_PROVIDER_SITE_OTHER): Payer: Self-pay | Admitting: Nurse Practitioner

## 2019-06-08 ENCOUNTER — Ambulatory Visit (INDEPENDENT_AMBULATORY_CARE_PROVIDER_SITE_OTHER): Payer: Medicare Other | Admitting: Nurse Practitioner

## 2019-06-08 ENCOUNTER — Other Ambulatory Visit: Payer: Self-pay

## 2019-06-08 ENCOUNTER — Ambulatory Visit (INDEPENDENT_AMBULATORY_CARE_PROVIDER_SITE_OTHER): Payer: Medicare Other

## 2019-06-08 VITALS — BP 156/78 | HR 84 | Resp 12 | Ht 69.0 in | Wt 215.0 lb

## 2019-06-08 DIAGNOSIS — K219 Gastro-esophageal reflux disease without esophagitis: Secondary | ICD-10-CM

## 2019-06-08 DIAGNOSIS — I739 Peripheral vascular disease, unspecified: Secondary | ICD-10-CM | POA: Diagnosis not present

## 2019-06-08 DIAGNOSIS — I70262 Atherosclerosis of native arteries of extremities with gangrene, left leg: Secondary | ICD-10-CM | POA: Diagnosis not present

## 2019-06-08 DIAGNOSIS — N186 End stage renal disease: Secondary | ICD-10-CM | POA: Diagnosis not present

## 2019-06-08 DIAGNOSIS — I5022 Chronic systolic (congestive) heart failure: Secondary | ICD-10-CM | POA: Diagnosis not present

## 2019-06-08 DIAGNOSIS — Z9862 Peripheral vascular angioplasty status: Secondary | ICD-10-CM

## 2019-06-12 ENCOUNTER — Encounter: Payer: Medicare Other | Attending: Physician Assistant | Admitting: Physician Assistant

## 2019-06-12 ENCOUNTER — Encounter: Payer: Self-pay | Admitting: Emergency Medicine

## 2019-06-12 ENCOUNTER — Emergency Department: Payer: Medicare Other

## 2019-06-12 ENCOUNTER — Other Ambulatory Visit: Payer: Self-pay

## 2019-06-12 ENCOUNTER — Emergency Department
Admission: EM | Admit: 2019-06-12 | Discharge: 2019-06-12 | Disposition: A | Discharge status: Home / Self Care | Payer: Medicare Other | Attending: Emergency Medicine | Admitting: Emergency Medicine

## 2019-06-12 DIAGNOSIS — Z7901 Long term (current) use of anticoagulants: Secondary | ICD-10-CM | POA: Insufficient documentation

## 2019-06-12 DIAGNOSIS — Z992 Dependence on renal dialysis: Secondary | ICD-10-CM | POA: Insufficient documentation

## 2019-06-12 DIAGNOSIS — N1330 Unspecified hydronephrosis: Secondary | ICD-10-CM | POA: Insufficient documentation

## 2019-06-12 DIAGNOSIS — M1990 Unspecified osteoarthritis, unspecified site: Secondary | ICD-10-CM | POA: Insufficient documentation

## 2019-06-12 DIAGNOSIS — Z7902 Long term (current) use of antithrombotics/antiplatelets: Secondary | ICD-10-CM | POA: Diagnosis not present

## 2019-06-12 DIAGNOSIS — R1031 Right lower quadrant pain: Secondary | ICD-10-CM | POA: Diagnosis present

## 2019-06-12 DIAGNOSIS — E1151 Type 2 diabetes mellitus with diabetic peripheral angiopathy without gangrene: Secondary | ICD-10-CM | POA: Insufficient documentation

## 2019-06-12 DIAGNOSIS — F419 Anxiety disorder, unspecified: Secondary | ICD-10-CM | POA: Insufficient documentation

## 2019-06-12 DIAGNOSIS — Z87891 Personal history of nicotine dependence: Secondary | ICD-10-CM | POA: Diagnosis not present

## 2019-06-12 DIAGNOSIS — E1122 Type 2 diabetes mellitus with diabetic chronic kidney disease: Secondary | ICD-10-CM | POA: Insufficient documentation

## 2019-06-12 DIAGNOSIS — E785 Hyperlipidemia, unspecified: Secondary | ICD-10-CM | POA: Insufficient documentation

## 2019-06-12 DIAGNOSIS — N186 End stage renal disease: Secondary | ICD-10-CM | POA: Diagnosis not present

## 2019-06-12 DIAGNOSIS — I509 Heart failure, unspecified: Secondary | ICD-10-CM | POA: Diagnosis not present

## 2019-06-12 DIAGNOSIS — Z794 Long term (current) use of insulin: Secondary | ICD-10-CM | POA: Diagnosis not present

## 2019-06-12 DIAGNOSIS — Z7982 Long term (current) use of aspirin: Secondary | ICD-10-CM | POA: Insufficient documentation

## 2019-06-12 DIAGNOSIS — F17200 Nicotine dependence, unspecified, uncomplicated: Secondary | ICD-10-CM | POA: Insufficient documentation

## 2019-06-12 DIAGNOSIS — I132 Hypertensive heart and chronic kidney disease with heart failure and with stage 5 chronic kidney disease, or end stage renal disease: Secondary | ICD-10-CM | POA: Diagnosis not present

## 2019-06-12 DIAGNOSIS — E1142 Type 2 diabetes mellitus with diabetic polyneuropathy: Secondary | ICD-10-CM | POA: Insufficient documentation

## 2019-06-12 DIAGNOSIS — Z79899 Other long term (current) drug therapy: Secondary | ICD-10-CM | POA: Insufficient documentation

## 2019-06-12 DIAGNOSIS — L97522 Non-pressure chronic ulcer of other part of left foot with fat layer exposed: Secondary | ICD-10-CM | POA: Insufficient documentation

## 2019-06-12 DIAGNOSIS — E11621 Type 2 diabetes mellitus with foot ulcer: Secondary | ICD-10-CM | POA: Insufficient documentation

## 2019-06-12 DIAGNOSIS — R109 Unspecified abdominal pain: Secondary | ICD-10-CM

## 2019-06-12 LAB — HEPATIC FUNCTION PANEL
ALT: 10 U/L (ref 0–44)
AST: 12 U/L — ABNORMAL LOW (ref 15–41)
Albumin: 2.8 g/dL — ABNORMAL LOW (ref 3.5–5.0)
Alkaline Phosphatase: 101 U/L (ref 38–126)
Bilirubin, Direct: <0.1 mg/dL (ref 0.0–0.2)
Total Bilirubin: 0.6 mg/dL (ref 0.3–1.2)
Total Protein: 7 g/dL (ref 6.5–8.1)

## 2019-06-12 LAB — CBC
HCT: 28.4 % — ABNORMAL LOW (ref 39.0–52.0)
Hemoglobin: 9.1 g/dL — ABNORMAL LOW (ref 13.0–17.0)
MCH: 31.2 pg (ref 26.0–34.0)
MCHC: 32 g/dL (ref 30.0–36.0)
MCV: 97.3 fL (ref 80.0–100.0)
Platelets: 283 10*3/uL (ref 150–400)
RBC: 2.92 MIL/uL — ABNORMAL LOW (ref 4.22–5.81)
RDW: 14.4 % (ref 11.5–15.5)
WBC: 9.9 10*3/uL (ref 4.0–10.5)
nRBC: 0 % (ref 0.0–0.2)

## 2019-06-12 LAB — BASIC METABOLIC PANEL
Anion gap: 13 (ref 5–15)
BUN: 64 mg/dL — ABNORMAL HIGH (ref 6–20)
CO2: 22 mmol/L (ref 22–32)
Calcium: 7.5 mg/dL — ABNORMAL LOW (ref 8.9–10.3)
Chloride: 101 mmol/L (ref 98–111)
Creatinine, Ser: 10.18 mg/dL — ABNORMAL HIGH (ref 0.61–1.24)
GFR calc Af Amer: 6 mL/min — ABNORMAL LOW (ref >60)
GFR calc non Af Amer: 5 mL/min — ABNORMAL LOW (ref >60)
Glucose, Bld: 128 mg/dL — ABNORMAL HIGH (ref 70–99)
Potassium: 4.2 mmol/L (ref 3.5–5.1)
Sodium: 136 mmol/L (ref 135–145)

## 2019-06-12 LAB — URINALYSIS, COMPLETE (UACMP) WITH MICROSCOPIC
Bacteria, UA: NONE SEEN
Bilirubin Urine: NEGATIVE
Glucose, UA: 50 mg/dL — AB
Ketones, ur: NEGATIVE mg/dL
Nitrite: NEGATIVE
Protein, ur: 100 mg/dL — AB
Specific Gravity, Urine: 1.015 (ref 1.005–1.030)
pH: 5 (ref 5.0–8.0)

## 2019-06-12 LAB — LIPASE, BLOOD: Lipase: 19 U/L (ref 11–51)

## 2019-06-12 MED ORDER — HYDROCODONE-ACETAMINOPHEN 5-325 MG PO TABS: 1.0000 | ORAL_TABLET | ORAL | 0 refills | Status: DC | PRN

## 2019-06-12 MED ORDER — SODIUM CHLORIDE 0.9 % IV BOLUS: 1000.0000 mL | Freq: Once | INTRAVENOUS | Status: DC

## 2019-06-12 MED ORDER — MORPHINE SULFATE (PF) 4 MG/ML IV SOLN
4.0000 mg | Freq: Once | INTRAVENOUS | Status: AC
  Administered 2019-06-12: 16:00:00 4 mg via INTRAVENOUS
  Filled 2019-06-12: qty 1

## 2019-06-12 MED ORDER — ONDANSETRON HCL 4 MG/2ML IJ SOLN
4.0000 mg | Freq: Once | INTRAMUSCULAR | Status: AC
  Administered 2019-06-12: 4 mg via INTRAVENOUS
  Filled 2019-06-12: qty 2

## 2019-06-12 NOTE — Progress Notes (Addendum)
KODI, STEIL ([id number]) Visit Report for 06/12/2019 Chief Complaint Document Details Patient Name: Scott [name], [name] Date of Service: 06/12/2019 1:15 PM Medical Record Number: [id number] Patient Account Number: [id number] Date of Birth/Sex: 10-16-1960 (58 y.o. M) Treating RN: Harold Barban Primary Care Provider: Norville Haggard Other Clinician: Referring Provider: Caroline More Treating Provider/Extender: STONE III, Adanely Reynoso Weeks in Treatment: 0 Information Obtained from: Patient Chief Complaint Chronic right forefoot ulcer. Arterial insufficiency. New onset cellulitis and abscess. Electronic Signature(s) Signed: 06/12/2019 1:19:39 PM By: Worthy Keeler PA-C Entered By: Worthy Keeler on 06/12/2019 13:19:38 Scott [name], [name] ([id number]) -------------------------------------------------------------------------------- HPI Details Patient Name: Scott Mount A. Date of Service: 06/12/2019 1:15 PM Medical Record Number: [id number] Patient Account Number: [id number] Date of Birth/Sex: [birth year] (58 y.o. M) Treating RN: Harold Barban Primary Care Provider: Norville Haggard Other Clinician: Referring Provider: Caroline More Treating Provider/Extender: Melburn Hake, Yue Glasheen Weeks in Treatment: 0 History of Present Illness HPI Description: The patient is a pleasant 58 year old with a past medical history significant for type 2 diabetes (hemoglobin A1c 13.2 in June 2015), peripheral neuropathy, PVD, and smoking. 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 in Nov 2014 as well as RLE angioplasty by [name]. He says that the surgical site never healed. He reports moderate pain at the site of his ulceration with pressure. He also describes symptoms consistent with claudication. No ischemic rest pain. ABI on the right is 0.99. Monophasic DP. Xray 08/01/2014 showed no definite osteomyelitis, MRI 08/17/2014 negative for  osteomyelitis. Biopsy cultures from 08/01/2014 grew Myroides species, sensitive only to imipenen. Completed 2 week course of doxycycline. Sensitivity to doxy requested but not performed. TCOM 33mmHg. Scheduled to see [name] and [name] in f/u. We have discussed hyperbaric oxygen therapy for his nonhealing Wagner grade 4 ulceration as well as arterial insufficiency but wishes to hold off until he obtains Medicaid coverage, which he is pursuing. He is offloading with a Darco shoe. Has declined total contact casting. Last week he presented with a small area of cellulitis on the dorsum of his right forefoot. Cultures grew methicillin sensitive staph aureus, sensitive to tigecycline. He was started on doxycycline. He returns to clinic for follow-up today and reports worsening redness, swelling, pain, and drainage. He denies any fevers. Readmission: Patient presents today for initial evaluation here in our clinic as a referral from [name] secondary to issues that he has been having with his wound. 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 His left great toe and it appears part of the first metatarsal which was amputated. With that being said the area of amputation actually does show some region of necrotic tissue where the flap does not seem to be taking. There is definite necrotic tissue underneath as well the sutures are still in place. Once the sutures are removed I do believe that a lot of this is getting need to be cleaned away. He sees [name] on Wednesday and at that time will be able to see how things are actually doing once he is able to remove some of the necrotic tissue here. I believe this patient may be a good candidate for a wound VAC based on what I am seeing. With that being said we have discussed the possibility of hyperbaric oxygen therapy. With that being said my concern in that regard is that of his ejection fraction which is somewhat low to consider hyperbarics  based on his last test which was on January 20 of 2020 where he registered at 35 to 40%. With that being said I definitely believe that being that this is greater than 6 months he would definitely need to see cardiology and have his ejection fraction rechecked prior to even consideration of the hyperbarics. The other thing is I am not sure that there is actually anything remaining which is showing signs of infection at this time hence the reason that he may also not be a hyperbaric candidate based on that. Obviously the patient can go into the hyperbaric oxygen chamber for a Waggoner grade 3 ulcer but again at this point I am not sure that it qualifies as such based on what I am seeing. There is no signs of active infection currently. This is also out of the timeframe for a failed graft/flap. Patient does have end-stage renal disease and is currently on peritoneal dialysis. He is also having a lot of pain in his abdomen especially on the right side today I am unsure if this could potentially be a kidney stone or something else. His blood pressure was also very faint upon evaluation he is actually going to the ER upon leaving here today he just wanted to get this appointment and first. His most recent hemoglobin A1c on 05/22/2019 was 6.1. He does see [name] on Wednesday and we have gotten permission through the referral to [place] to see this patient even though they are in the postop global 90-day period. Electronic Signature(s) Signed: 06/12/2019 2:18:03 PM By: Worthy Keeler PA-C Entered By: Worthy Keeler on 06/12/2019 14:18:03 [name], [name] ([id number]) Scott [name], [name] ([id number]) -------------------------------------------------------------------------------- Physical Exam Details Patient Name: Scott [name], [name] Date of Service: 06/12/2019 1:15 PM Medical Record Number: [id number] Patient Account Number: [id number] Date of Birth/Sex: August 22, 1961 (59 y.o. M) Treating RN: Harold Barban Primary Care Provider: Norville Haggard Other Clinician: Referring Provider: Caroline More Treating Provider/Extender: STONE III, Dwayne Begay Weeks in Treatment: 0 Constitutional patient is hypertensive.. pulse regular and within target range for patient.[name] respirations regular, non-labored and within target range for patient.[name] temperature within target range for patient.. Well-nourished and well-hydrated in no acute distress. Eyes conjunctiva clear no eyelid edema noted. pupils equal round and reactive to light and accommodation. Ears, Nose, Mouth, and Throat no gross abnormality of ear auricles or external auditory canals. normal hearing noted during conversation. mucus membranes moist. Respiratory normal breathing without difficulty. clear to auscultation bilaterally. Cardiovascular regular rate and rhythm with normal S1, S2. no clubbing, cyanosis, significant edema, <3 sec cap refill. Gastrointestinal (GI) soft, non-tender, non-distended, +BS. no ventral hernia noted. Musculoskeletal normal gait and posture. Upon inspection patient does have a left great toe ray amputation.[name] Psychiatric this patient is able to make decisions and demonstrates good insight into disease process. Alert and Oriented x 3. pleasant and cooperative. Notes Upon evaluation today patient's wound actually does still have sutures in place currently. He also has necrotic tissue and half of the flap noted today which again once he undergoes evaluation by [name] on Wednesday there to likely remove the sutures the patient tells me and he even mention cleaning away some of the necrotic tissue in regard to the flap. That would expose what is open underneath and then will be able to proceed from there the patient may need a wound VAC based on what I am seeing. Electronic Signature(s) Signed: 06/12/2019 2:19:17 PM By: Worthy Keeler PA-C Entered By: Worthy Keeler on  06/12/2019 14:19:17 Scott [name], [name]  ([id number]) -------------------------------------------------------------------------------- Physician Orders Details Patient Name: Scott [name], [name] Date of Service: 06/12/2019 1:15 PM Medical Record Number: [id number] Patient Account Number: [id number] Date of Birth/Sex: Aug 02, 1961 (58 y.o. M) Treating RN: Harold Barban Primary Care Provider: Norville Haggard Other Clinician: Referring Provider: Caroline More Treating Provider/Extender: Melburn Hake, Kuba Shepherd Weeks in Treatment: 0 Verbal / Phone Orders: No Diagnosis Coding ICD-10 Coding Code Description E11.621 Type 2 diabetes mellitus with foot ulcer T81.31XA Disruption of external operation (surgical) wound, not elsewhere classified, initial encounter L97.522 Non-pressure chronic ulcer of other part of left foot with fat layer exposed N18.6 End stage renal disease Z99.2 Dependence on renal dialysis I10 Essential (primary) hypertension Wound Cleansing Wound #4 Left Amputation Site - Toe o Dial antibacterial soap, wash wounds, rinse and pat dry prior to dressing wounds o May Shower, gently pat wound dry prior to applying new dressing. Primary Wound Dressing Wound #4 Left Amputation Site - Toe o Silver Alginate o Other: - Apply Betadine to and around wound Secondary Dressing Wound #4 Left Amputation Site - Toe o ABD pad - rolled gauze to secure Dressing Change Frequency Wound #4 Left Amputation Site - Toe o Change dressing every other day. Follow-up Appointments Wound #4 Left Amputation Site - Toe o Return Appointment in 1 week. Electronic Signature(s) Signed: 06/12/2019 5:28:28 PM By: Worthy Keeler PA-C Signed: 06/14/2019 4:37:37 PM By: Harold Barban Entered By: Harold Barban on 06/12/2019 14:14:32 Scott [name], [name] ([id number]) -------------------------------------------------------------------------------- Problem List Details Patient Name: Scott [name], [name] Date of Service: 06/12/2019 1:15 PM Medical  Record Number: [id number] Patient Account Number: [id number] Date of Birth/Sex: Dec 29, 1960 (57 y.o. M) Treating RN: Harold Barban Primary Care Provider: Norville Haggard Other Clinician: Referring Provider: Caroline More Treating Provider/Extender: Melburn Hake, Carlea Badour Weeks in Treatment: 0 Active Problems ICD-10 Evaluated Encounter Code Description Active Date Today Diagnosis E11.621 Type 2 diabetes mellitus with foot ulcer 06/12/2019 No Yes T81.31XA Disruption of external operation (surgical) wound, not 06/12/2019 No Yes elsewhere classified, initial encounter L97.522 Non-pressure chronic ulcer of other part of left foot with fat 06/12/2019 No Yes layer exposed N18.6 End stage renal disease 06/12/2019 No Yes Z99.2 Dependence on renal dialysis 06/12/2019 No Yes I10 Essential (primary) hypertension 06/12/2019 No Yes Inactive Problems Resolved Problems Electronic Signature(s) Signed: 06/12/2019 1:47:48 PM By: Worthy Keeler PA-C Previous Signature: 06/12/2019 1:19:33 PM Version By: Worthy Keeler PA-C Entered By: Worthy Keeler on 06/12/2019 13:47:47 Repsher, Gershon A. ([id number]) -------------------------------------------------------------------------------- Progress Note/History and Physical Details Patient Name: Scott [name], [name] Date of Service: 06/12/2019 1:15 PM Medical Record Number: [id number] Patient Account Number: [id number] Date of Birth/Sex: 1961/02/03 (58 y.o. M) Treating RN: Harold Barban Primary Care Provider: Norville Haggard Other Clinician: Referring Provider: Caroline More Treating Provider/Extender: STONE III, Berdena Cisek Weeks in Treatment: 0 Subjective Chief Complaint Information obtained from Patient Chronic right forefoot ulcer. Arterial insufficiency. New onset cellulitis and abscess. History of Present Illness (HPI) The patient is a pleasant 58 year old with a past medical history significant for type 2 diabetes (hemoglobin A1c 13.2 in June 2015), peripheral  neuropathy, PVD, and smoking. 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 in Nov 2014 as well as RLE angioplasty by [name]. He says that the surgical site never healed. He reports moderate pain at the site of his ulceration with pressure. He also describes symptoms consistent with claudication. No ischemic rest pain. ABI on the right is 0.99.  Monophasic DP. Xray 08/01/2014 showed no definite osteomyelitis, MRI 08/17/2014 negative for osteomyelitis. Biopsy cultures from 08/01/2014 grew Myroides species, sensitive only to imipenen. Completed 2 week course of doxycycline. Sensitivity to doxy requested but not performed. TCOM 44mmHg. Scheduled to see [name] and [name] in f/u. We have discussed hyperbaric oxygen therapy for his nonhealing Wagner grade 4 ulceration as well as arterial insufficiency but wishes to hold off until he obtains Medicaid coverage, which he is pursuing. He is offloading with a Darco shoe. Has declined total contact casting. Last week he presented with a small area of cellulitis on the dorsum of his right forefoot. Cultures grew methicillin sensitive staph aureus, sensitive to tigecycline. He was started on doxycycline. He returns to clinic for follow-up today and reports worsening redness, swelling, pain, and drainage. He denies any fevers. Readmission: Patient presents today for initial evaluation here in our clinic as a referral from [name] secondary to issues that he has been having with his wound. 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 His left great toe and it appears part of the first metatarsal which was amputated. With that being said the area of amputation actually does show some region of necrotic tissue where the flap does not seem to be taking. There is definite necrotic tissue underneath as well the sutures are still in place. Once the sutures are removed I do believe  that a lot of this is getting need to be cleaned away. He sees [name] on Wednesday and at that time will be able to see how things are actually doing once he is able to remove some of the necrotic tissue here. I believe this patient may be a good candidate for a wound VAC based on what I am seeing. With that being said we have discussed the possibility of hyperbaric oxygen therapy. With that being said my concern in that regard is that of his ejection fraction which is somewhat low to consider hyperbarics based on his last test which was on January 20 of 2020 where he registered at 35 to 40%. With that being said I definitely believe that being that this is greater than 6 months he would definitely need to see cardiology and have his ejection fraction rechecked prior to even consideration of the hyperbarics. The other thing is I am not sure that there is actually anything remaining which is showing signs of infection at this time hence the reason that he may also not be a hyperbaric candidate based on that. Obviously the patient can go into the hyperbaric oxygen chamber for a Waggoner grade 3 ulcer but again at this point I am not sure that it qualifies as such based on what I am seeing. There is no signs of active infection currently. This is also out of the timeframe for a failed graft/flap. Patient does have end-stage renal disease and is currently on peritoneal dialysis. He is also having a lot of pain in his abdomen especially on the right side today I am unsure if this could potentially be a kidney stone or something else. His blood pressure was also very faint upon evaluation he is actually going to the ER upon leaving here today he just wanted to get this appointment and first. His most recent hemoglobin A1c on 05/22/2019 was 6.1. He does see [name] on Wednesday and we have gotten permission through the referral to [place] to see this patient even though they are in the postop global  90-day period. Scott [name], [name] ([id number]) Patient History Information obtained from Patient. Allergies No Known Allergies Family History Cancer - Father, Diabetes - Siblings, No family history of Heart Disease, Hereditary Spherocytosis, Hypertension, Kidney Disease, Lung Disease, Seizures, Stroke, Thyroid Problems, Tuberculosis. Social History Current every day smoker, Marital Status - Single, Alcohol Use - Never, Drug Use - No History, Caffeine Use - Daily - coffee, tea. Medical History Eyes Denies history of Cataracts, Glaucoma, Optic Neuritis Ear/Nose/Mouth/Throat Denies history of Chronic sinus problems/congestion, Middle ear problems Hematologic/Lymphatic Denies history of Anemia, Hemophilia, Human Immunodeficiency Virus, Lymphedema, Sickle Cell Disease Respiratory Denies history of Aspiration, Asthma, Chronic Obstructive Pulmonary Disease (COPD), Pneumothorax, Sleep Apnea, Tuberculosis Cardiovascular Patient has history of Congestive Heart Failure, Coronary Artery Disease, Hypertension, Peripheral Venous Disease Denies history of Angina, Arrhythmia, Deep Vein Thrombosis, Hypotension, Myocardial Infarction, Peripheral Arterial Disease, Phlebitis, Vasculitis Gastrointestinal Denies history of Cirrhosis , Colitis, Crohn s, Hepatitis A, Hepatitis B, Hepatitis C Endocrine Patient has history of Type II Diabetes Denies history of Type I Diabetes Genitourinary Patient has history of End Stage Renal Disease - ESRD on PD Immunological Denies history of Lupus Erythematosus, Raynaud s, Scleroderma Integumentary (Skin) Denies history of History of Burn, History of pressure wounds Musculoskeletal Patient has history of Osteoarthritis Denies history of Gout, Rheumatoid Arthritis, Osteomyelitis Neurologic Patient has history of Neuropathy Denies history of Dementia, Quadriplegia, Paraplegia, Seizure Disorder Oncologic Denies history of Received Chemotherapy, Received  Radiation Psychiatric Patient has history of Confinement Anxiety Denies history of Anorexia/bulimia Patient is treated with Insulin, Oral Agents. Blood sugar is tested. Blood sugar results noted at the following times: Breakfast - 200's. Hospitalization/Surgery History - Infection of toe/Amputation. Medical And Surgical History Notes Scott [name], [name] ([id number]) Cardiovascular HLD Review of Systems (ROS) Cardiovascular Complains or has symptoms of LE edema. Denies complaints or symptoms of Chest pain. Gastrointestinal Denies complaints or symptoms of Frequent diarrhea, Nausea, Vomiting. Endocrine Denies complaints or symptoms of Hepatitis, Thyroid disease, Polydypsia (Excessive Thirst). Genitourinary Complains or has symptoms of Kidney failure/ Dialysis - ESRD on PD. Denies complaints or symptoms of Incontinence/dribbling. Immunological Denies complaints or symptoms of Hives, Itching. Integumentary (Skin) Complains or has symptoms of Wounds. Denies complaints or symptoms of Bleeding or bruising tendency, Breakdown, Swelling. Musculoskeletal Denies complaints or symptoms of Muscle Pain, Muscle Weakness. Neurologic Denies complaints or symptoms of Numbness/parasthesias, Focal/Weakness. General Notes: right upper arm fistula, PD catheter Objective Constitutional patient is hypertensive.. pulse regular and within target range for patient.[name] respirations regular, non-labored and within target range for patient.[name] temperature within target range for patient.. Well-nourished and well-hydrated in no acute distress. Vitals Time Taken: 1:24 PM, Height: 69 in, Source: Measured, Weight: 215 lbs, Source: Measured, BMI: 31.7, Temperature: 98.1 F, Pulse: 79 bpm, Respiratory Rate: 18 breaths/min, Blood Pressure: 152/34 mmHg. Eyes conjunctiva clear no eyelid edema noted. pupils equal round and reactive to light and accommodation. Ears, Nose, Mouth, and Throat no gross abnormality of ear  auricles or external auditory canals. normal hearing noted during conversation. mucus membranes moist. Respiratory normal breathing without difficulty. clear to auscultation bilaterally. Cardiovascular regular rate and rhythm with normal S1, S2. no clubbing, cyanosis, significant edema, Gastrointestinal (GI) soft, non-tender, non-distended, +BS. no ventral hernia noted. Musculoskeletal Scott [name], [name] ([id number]) normal gait and posture. Upon inspection patient does have a left great toe ray amputation.[name] Psychiatric this patient is able to make decisions and demonstrates good insight into disease process. Alert and Oriented x 3. pleasant and cooperative. General Notes: Upon evaluation today patient's wound actually does  still have sutures in place currently. He also has necrotic tissue and half of the flap noted today which again once he undergoes evaluation by [name] on Wednesday there to likely remove the sutures the patient tells me and he even mention cleaning away some of the necrotic tissue in regard to the flap. That would expose what is open underneath and then will be able to proceed from there the patient may need a wound VAC based on what I am seeing. Integumentary (Hair, Skin) Wound #4 status is Open. Original cause of wound was Surgical Injury. The wound is located on the Left Amputation Site - Toe. The wound measures 7cm length x 3cm width x 0.1cm depth; 16.493cm^2 area and 1.649cm^3 volume. There is Fat Layer (Subcutaneous Tissue) Exposed exposed. There is no tunneling or undermining noted. There is a medium amount of serous drainage noted. The wound margin is flat and intact. There is small (1-33%) granulation within the wound bed. There is a large (67-100%) amount of necrotic tissue within the wound bed including Eschar and Adherent Slough. General Notes: 9 sutures intact Assessment Active Problems ICD-10 Type 2 diabetes mellitus with foot ulcer Disruption of  external operation (surgical) wound, not elsewhere classified, initial encounter Non-pressure chronic ulcer of other part of left foot with fat layer exposed End stage renal disease Dependence on renal dialysis Essential (primary) hypertension Plan Wound Cleansing: Wound #4 Left Amputation Site - Toe: Dial antibacterial soap, wash wounds, rinse and pat dry prior to dressing wounds May Shower, gently pat wound dry prior to applying new dressing. Primary Wound Dressing: Wound #4 Left Amputation Site - Toe: Silver Alginate Other: - Apply Betadine to and around wound Secondary Dressing: Wound #4 Left Amputation Site - Toe: ABD pad - rolled gauze to secure Dressing Change Frequency: Wound #4 Left Amputation Site - Toe: Change dressing every other day. Follow-up Appointments: PEARLIE, LAFOSSE A. ([id number]) Wound #4 Left Amputation Site - Toe: Return Appointment in 1 week. 1. At this point again with regard to hyperbarics first and foremost I think that this could be problematic based on the patient's ejection fraction of 35 to 40% which is somewhat low and that is greater than 6 months old. He would need to see cardiology again prior to proceeding with hyperbarics in my opinion that appointment is being worked on already but nonetheless that would absolutely need to be the case. 2. With regard to the reason to dive the patient again he is out of the timeframe for a failed graft/flap. He also would not based on what I am seeing currently qualify for Wagoner grade 3 ulcer as again there is no signs of infection at this point. Again he did have surgery to remove the infected bone and gangrenous area. Therefore that skin to come into play as well. 3. With regard to the drainage I am to recommend Betadine continued around the edge of the wound since that seems to have done fairly well for the patient as far as keeping the edges clean and dry. Subsequently we will use a silver alginate dressing  over top of the wound itself to catch the drainage this will also help to soften up any of the necrotic tissue without keeping it too moist. 3. I do recommend the patient continue to offload as much as possible in regard to this foot to help with appropriate healing. We will see patient back for reevaluation in 1 week here in the clinic. If anything worsens or changes patient  will contact our office for additional recommendations. The patient is good to be going to the emergency department upon leaving here today to have the abdominal pain checked out this began last night he could potentially have a kidney stone or something totally different such as infection with his complicated history I do think this is something that he needs to address as soon as possible and he is going to go do that upon leaving our office at this point. Electronic Signature(s) Signed: 06/12/2019 2:21:25 PM By: Worthy Keeler PA-C Entered By: Worthy Keeler on 06/12/2019 14:21:24 Stowers, Kinsey A. ([id number]) -------------------------------------------------------------------------------- ROS/PFSH Details Patient Name: Scott Mount A. Date of Service: 06/12/2019 1:15 PM Medical Record Number: [id number] Patient Account Number: [id number] Date of Birth/Sex: 1960-09-17 (58 y.o. M) Treating RN: Montey Hora Primary Care Provider: Norville Haggard Other Clinician: Referring Provider: Caroline More Treating Provider/Extender: STONE III, Leather Estis Weeks in Treatment: 0 Label Progress Note Print Version as History and Physical for this encounter Information Obtained From Patient Cardiovascular Complaints and Symptoms: Positive for: LE edema Negative for: Chest pain Medical History: Positive for: Congestive Heart Failure; Coronary Artery Disease; Hypertension; Peripheral Venous Disease Negative for: Angina; Arrhythmia; Deep Vein Thrombosis; Hypotension; Myocardial Infarction; Peripheral Arterial Disease; Phlebitis;  Vasculitis Past Medical History Notes: HLD Gastrointestinal Complaints and Symptoms: Negative for: Frequent diarrhea; Nausea; Vomiting Medical History: Negative for: Cirrhosis ; Colitis; Crohnos; Hepatitis A; Hepatitis B; Hepatitis C Endocrine Complaints and Symptoms: Negative for: Hepatitis; Thyroid disease; Polydypsia (Excessive Thirst) Medical History: Positive for: Type II Diabetes Negative for: Type I Diabetes Time with diabetes: 8yrs Treated with: Insulin, Oral agents Blood sugar tested every day: Yes Tested : twice daily Blood sugar testing results: Breakfast: 200's Genitourinary Complaints and Symptoms: Positive for: Kidney failure/ Dialysis - ESRD on PD Negative for: Incontinence/dribbling Medical History: Positive for: End Stage Renal Disease - ESRD on PD Immunological Montag, Undray A. ([id number]) Complaints and Symptoms: Negative for: Hives; Itching Medical History: Negative for: Lupus Erythematosus; Raynaudos; Scleroderma Integumentary (Skin) Complaints and Symptoms: Positive for: Wounds Negative for: Bleeding or bruising tendency; Breakdown; Swelling Medical History: Negative for: History of Burn; History of pressure wounds Musculoskeletal Complaints and Symptoms: Negative for: Muscle Pain; Muscle Weakness Medical History: Positive for: Osteoarthritis Negative for: Gout; Rheumatoid Arthritis; Osteomyelitis Neurologic Complaints and Symptoms: Negative for: Numbness/parasthesias; Focal/Weakness Medical History: Positive for: Neuropathy Negative for: Dementia; Quadriplegia; Paraplegia; Seizure Disorder Eyes Medical History: Negative for: Cataracts; Glaucoma; Optic Neuritis Ear/Nose/Mouth/Throat Medical History: Negative for: Chronic sinus problems/congestion; Middle ear problems Hematologic/Lymphatic Medical History: Negative for: Anemia; Hemophilia; Human Immunodeficiency Virus; Lymphedema; Sickle Cell Disease Respiratory Medical  History: Negative for: Aspiration; Asthma; Chronic Obstructive Pulmonary Disease (COPD); Pneumothorax; Sleep Apnea; Tuberculosis Oncologic Medical History: Negative for: Received Chemotherapy; Received Radiation LARZ, MARK A. ([id number]) Psychiatric Medical History: Positive for: Confinement Anxiety Negative for: Anorexia/bulimia Immunizations Pneumococcal Vaccine: Received Pneumococcal Vaccination: Yes Tetanus Vaccine: Last tetanus shot: 07/03/2013 Implantable Devices None Hospitalization / Surgery History Type of Hospitalization/Surgery Infection of toe/Amputation Family and Social History Cancer: Yes - Father; Diabetes: Yes - Siblings; Heart Disease: No; Hereditary Spherocytosis: No; Hypertension: No; Kidney Disease: No; Lung Disease: No; Seizures: No; Stroke: No; Thyroid Problems: No; Tuberculosis: No; Current every day smoker; Marital Status - Single; Alcohol Use: Never; Drug Use: No History; Caffeine Use: Daily - coffee, tea; Financial Concerns: No; Food, Clothing or Shelter Needs: No; Support System Lacking: No; Transportation Concerns: No Notes right upper arm fistula, PD catheter Electronic Signature(s) Signed: 06/12/2019 5:14:47 PM By: Montey Hora Signed: 06/12/2019 5:28:28  PM By: Worthy Keeler PA-C Entered By: Montey Hora on 06/12/2019 13:29:28 Skluzacek, Seraj A. ([id number]) -------------------------------------------------------------------------------- SuperBill Details Patient Name: Scott Mount A. Date of Service: 06/12/2019 Medical Record Number: [id number] Patient Account Number: [id number] Date of Birth/Sex: 04-Feb-1961 (58 y.o. M) Treating RN: Harold Barban Primary Care Provider: Norville Haggard Other Clinician: Referring Provider: Caroline More Treating Provider/Extender: Melburn Hake, Nihal Marzella Weeks in Treatment: 0 Diagnosis Coding ICD-10 Codes Code Description E11.621 Type 2 diabetes mellitus with foot ulcer T81.31XA Disruption of external  operation (surgical) wound, not elsewhere classified, initial encounter L97.522 Non-pressure chronic ulcer of other part of left foot with fat layer exposed N18.6 End stage renal disease Z99.2 Dependence on renal dialysis I10 Essential (primary) hypertension Facility Procedures CPT4 Code: 03128118 Description: [id number] - WOUND CARE VISIT-LEV 2 EST PT Modifier: Quantity: 1 Physician Procedures CPT4: Description Modifier Quantity Code [id number] - WC PHYS LEVEL 4 - NEW PT 1 ICD-10 Diagnosis Description E11.621 Type 2 diabetes mellitus with foot ulcer T81.31XA Disruption of external operation (surgical) wound, not elsewhere classified,  initial encounter L97.522 Non-pressure chronic ulcer of other part of left foot with fat layer exposed N18.6 End stage renal disease Electronic Signature(s) Signed: 06/12/2019 2:21:40 PM By: Worthy Keeler PA-C Entered By: Worthy Keeler on 06/12/2019 14:21:40

## 2019-06-12 NOTE — ED Provider Notes (Signed)
[facility] Emergency Department Provider Note  Time seen: 3:29 PM  I have reviewed the triage vital signs and the nursing notes.   HISTORY  Chief Complaint Flank Pain   HPI Scott [name] is a 58 y.o. male with a past medical history of hypertension, hyperlipidemia, ESRD on hemodialysis Monday/Wednesday/Friday (got full treatments morning), presents to the emergency department for right flank pain.  According to the patient since yesterday he has been experiencing 10/10 sharp pain to his right flank/right "kidney area."  Patient denies any vomiting, diarrhea.  Still makes urine each day and denies any hematuria or dysuria.  Denies any fever cough or shortness of breath.  Describes his pain as 10/10 sharp flank pain currently.   Past Medical History:  Diagnosis Date  . Arthritis    "left arm; right leg" (12/13/2014)  . Asthma   . CHF (congestive heart failure) ([place])   . Chronic disease anemia    [name] 12/13/2014  . Chronic kidney disease (CKD), stage IV (severe) ([place])    [name] 12/13/2014... on dialysis  . Complication of anesthesia    unable to urinate after CAPD urgery  . Continuous ambulatory peritoneal dialysis status ([place])   . Coronary artery disease    [name] 12/13/2014  . Depression   . Dysrhythmia    patient unaware of irregular heartbeat  . GERD (gastroesophageal reflux disease)   . High cholesterol    [name] 12/13/2014  . Hypertension   . Non-Q wave myocardial infarction ([place])    [name] 12/13/2014  . PVD (peripheral vascular disease) ([place])    [name] 12/13/2014  . Type II diabetes mellitus [facility])     Patient Active Problem List   Diagnosis Date Noted  . Anemia 04/15/2019  . Cellulitis 01/21/2019  . Acute respiratory failure with hypoxia ([place]) 09/17/2018  . History of colonic polyps   . Positive fecal occult blood test   . 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 Atherosclerotic heart  disease of native coronary artery without angina pectoris 06/02/2017  . Encounter for screening for depression 06/02/2017  . Gastro-esophageal reflux disease with esophagitis 06/02/2017  . Other asthma 06/02/2017  . Other specified arthritis, unspecified site 06/02/2017  . Acidosis 05/31/2017  . Anemia in chronic kidney disease 05/31/2017  . Hyperkalemia 05/31/2017  . Iron deficiency anemia, unspecified 05/31/2017  . Liver disease, unspecified 05/31/2017  . Moderate protein-calorie malnutrition ([place]) 05/31/2017  . Other dietary vitamin B12 deficiency anemia 05/31/2017  . Other disorders of electrolyte and fluid balance, not elsewhere classified 05/31/2017  . Other disorders resulting from impaired renal tubular function 05/31/2017  . Other hyperlipidemia 05/31/2017  . Other long term (current) drug therapy 05/31/2017  . Secondary hyperparathyroidism of renal origin ([place]) 05/31/2017  . Unspecified jaundice 05/31/2017  . ESRD (end stage renal disease) ([place]) 11/25/2016  . Tobacco abuse 11/25/2016  . Non-ST elevation (NSTEMI) myocardial infarction ([place]) 10/21/2016  . Pure hypercholesterolemia 10/21/2016  . Type 2 diabetes mellitus with other diabetic kidney complication ([place]) 41/66/0630  . Decreased renal function 05/23/2015  . GERD (gastroesophageal reflux disease) 04/04/2015  . Peripheral vascular disease ([place]) 02/26/2015  . Unspecified systolic (congestive) heart failure ([place]) 12/13/2014    Past Surgical History:  Procedure Laterality Date  . AMPUTATION TOE Left 05/24/2019   Procedure: Left great toe amputation;  Surgeon: Caroline More, DPM;  Location: [facility] ORS;  Service: Podiatry;  Laterality: Left;  . AV FISTULA PLACEMENT Right 04/07/2017   Procedure:  ARTERIOVENOUS (AV) FISTULA CREATION ( BRACHIALCEPHALIC );  Surgeon: Katha Cabal, MD;  Location: [facility] ORS;  Service: Vascular;  Laterality: Right;  . CAPD INSERTION N/A 04/07/2017   Procedure: LAPAROSCOPIC INSERTION CONTINUOUS  AMBULATORY PERITONEAL DIALYSIS  (CAPD) CATHETER;  Surgeon: Katha Cabal, MD;  Location: [facility] ORS;  Service: Vascular;  Laterality: N/A;  . CARDIAC CATHETERIZATION  11/2014  . COLONOSCOPY WITH PROPOFOL N/A 12/01/2017   Procedure: COLONOSCOPY WITH PROPOFOL;  Surgeon: Lin Landsman, MD;  Location: [place];  Service: Endoscopy;  Laterality: N/A;  Diabetic - insulin  . COLONOSCOPY WITH PROPOFOL N/A 12/29/2017   Procedure: COLONOSCOPY WITH PROPOFOL;  Surgeon: Lin Landsman, MD;  Location: [place];  Service: Endoscopy;  Laterality: N/A;  . DIALYSIS/PERMA CATHETER INSERTION N/A 01/18/2017   Procedure: Dialysis/Perma Catheter Insertion;  Surgeon: Algernon Huxley, MD;  Location: [place] [facility];  Service: Cardiovascular;  Laterality: N/A;  . DIALYSIS/PERMA CATHETER REMOVAL N/A 07/26/2017   Procedure: DIALYSIS/PERMA CATHETER REMOVAL;  Surgeon: Algernon Huxley, MD;  Location: [place] [facility];  Service: Cardiovascular;  Laterality: N/A;  . INCISION AND DRAINAGE OF WOUND Right ~ 10/2014   "4th toe foot"  . LOWER EXTREMITY ANGIOGRAPHY Left 05/23/2019   Procedure: Lower Extremity Angiography;  Surgeon: Katha Cabal, MD;  Location: [place] [facility];  Service: Cardiovascular;  Laterality: Left;  . PERCUTANEOUS CORONARY STENT INTERVENTION (PCI-S) N/A 12/14/2014   Procedure: PERCUTANEOUS CORONARY STENT INTERVENTION (PCI-S);  Surgeon: Charolette Forward, MD;  Location: [facility] CATH LAB;  Service: Cardiovascular;  Laterality: N/A;  . PERCUTANEOUS CORONARY STENT INTERVENTION (PCI-S) N/A 12/18/2014   Procedure: PERCUTANEOUS CORONARY STENT INTERVENTION (PCI-S);  Surgeon: Charolette Forward, MD;  Location: [facility] CATH LAB;  Service: Cardiovascular;  Laterality: N/A;  . POLYPECTOMY  12/01/2017   Procedure: POLYPECTOMY;  Surgeon: Lin Landsman, MD;  Location: [place];  Service: Endoscopy;;  . POLYPECTOMY  12/29/2017   Procedure: POLYPECTOMY;  Surgeon: Lin Landsman, MD;   Location: [place];  Service: Endoscopy;;  . TOE AMPUTATION Right 2015   4th toe    Prior to Admission medications   Medication Sig Start Date End Date Taking? Authorizing Provider  albuterol (PROVENTIL HFA;VENTOLIN HFA) 108 (90 BASE) MCG/ACT inhaler Inhale 2 puffs into the lungs every 6 (six) hours as needed for wheezing or shortness of breath. 01/10/15   Gladstone Lighter, MD  amLODipine (NORVASC) 10 MG tablet Take 10 mg by mouth daily.    [provider]  amoxicillin-clavulanate (AUGMENTIN) 500-125 MG tablet TAKE 1 TABLET BY MOUTH TWICE A DAY FOR 7 DAYS 05/26/19   [provider]  aspirin EC 81 MG tablet Take 1 tablet (81 mg total) by mouth daily. 01/10/15   Gladstone Lighter, MD  atorvastatin (LIPITOR) 40 MG tablet Take 40 mg by mouth daily. 05/15/19   [provider]  B Complex-C-Folic Acid (DIALYVITE 696) 0.8 MG TABS Take 1 tablet by mouth daily. 10/25/17   [provider]  calcitRIOL (ROCALTROL) 0.25 MCG capsule Take 0.25 mcg by mouth daily.    [provider]  calcium acetate (PHOSLO) 667 MG capsule Take 2,001 mg by mouth 3 (three) times daily with meals. Takes 1-2 caps if he has snacks 06/08/17   [provider]  carvedilol (COREG) 25 MG tablet Take 1 tablet (25 mg total) by mouth 2 (two) times daily with a meal. 08/30/17   Alisa Graff, FNP  Cholecalciferol (VITAMIN D) 2000 units tablet Take 2,000 Units by  mouth daily. Unsure how many units    [provider]  clopidogrel (PLAVIX) 75 MG tablet Take 1 tablet (75 mg total) by mouth daily with breakfast. 05/27/19   Dustin Flock, MD  furosemide (LASIX) 80 MG tablet Take 80 mg by mouth daily. 12/01/18   [provider]  gentamicin cream (GARAMYCIN) 0.1 % Apply 1 application topically daily. 03/02/18   [provider]  hydrALAZINE (APRESOLINE) 25 MG tablet Take 1 tablet (25 mg total) by mouth 3 (three) times daily. Patient taking differently: Take 25 mg  by mouth 3 (three) times daily. Pt states he only takes once a day 03/12/18 06/10/18  Fritzi Mandes, MD  HYDROcodone-acetaminophen (NORCO/VICODIN) 5-325 MG tablet Take 1-2 tablets by mouth every 6 (six) hours as needed for moderate pain. 05/26/19   Dustin Flock, MD  insulin aspart protamine- aspart (NOVOLOG MIX 70/30) (70-30) 100 UNIT/ML injection Inject 0.25 mLs (25 Units total) into the skin 2 (two) times daily with a meal. Patient taking differently: Inject 20-25 Units into the skin 2 (two) times daily with a meal.  03/12/18   Fritzi Mandes, MD  nitroGLYCERIN (NITROSTAT) 0.4 MG SL tablet Place 0.4 mg under the tongue every 5 (five) minutes as needed for chest pain. Pt needs new Rx. Bottle has expried    [provider]  omeprazole (PRILOSEC) 20 MG capsule Take 1 capsule (20 mg total) by mouth daily. 01/10/15   Gladstone Lighter, MD  ONE TOUCH ULTRA TEST test strip  11/25/17   [provider]    No Known Allergies  Family History  Problem Relation Age of Onset  . Cancer Mother        Lung  . Cancer Father        Lung  . Diabetes Sister   . Diabetes Brother   . CAD Brother   . Hernia Son   . Cancer Brother     Social History Social History   Tobacco Use  . Smoking status: Former Smoker    Packs/day: 1.00    Years: 30.00    Pack years: 30.00    Types: Cigarettes    Start date: 08/31/1984    Quit date: 11/30/2014    Years since quitting: 4.5  . Smokeless tobacco: Never Used  Substance Use Topics  . Alcohol use: Yes    Comment: Rarely, twice a year.  . Drug use: No    Review of Systems Constitutional: Negative for fever Cardiovascular: Negative for chest pain. Respiratory: Negative for shortness of breath. Gastrointestinal: Sharp right flank pain. Genitourinary: Negative for urinary compaints Musculoskeletal: Negative for musculoskeletal complaints Skin: Negative for skin complaints  Neurological: Negative for headache All other ROS  negative  ____________________________________________   PHYSICAL EXAM:  VITAL SIGNS: ED Triage Vitals  Enc Vitals Group     BP 06/12/19 1456 (!) 147/48     Pulse Rate 06/12/19 1456 80     Resp 06/12/19 1456 18     Temp 06/12/19 1456 97.9 F (36.6 C)     Temp Source 06/12/19 1456 Oral     SpO2 06/12/19 1456 99 %     Weight 06/12/19 1457 213 lb 13.5 oz (97 kg)     Height 06/12/19 1457 5\' 9"  (1.753 m)     Head Circumference --      Peak Flow --      Pain Score 06/12/19 1456 10     Pain Loc --      Pain Edu? --  Excl. in [place]? --    Constitutional: Alert and oriented. Well appearing and in no distress. Eyes: Normal exam ENT      Head: Normocephalic and atraumatic.      Mouth/Throat: Mucous membranes are moist. Cardiovascular: Normal rate, regular rhythm.  Respiratory: Normal respiratory effort without tachypnea nor retractions. Breath sounds are clear  Gastrointestinal: Soft and nontender. No distention.  Musculoskeletal: Nontender with normal range of motion in all extremities. Neurologic:  Normal speech and language. No gross focal neurologic deficits Skin:  Skin is warm, dry and intact.  Psychiatric: Mood and affect are normal.   ____________________________________________  RADIOLOGY  CT shows mild right-sided hydronephrosis without ureteral stone.    ____________________________________________   INITIAL IMPRESSION / ASSESSMENT AND PLAN / ED COURSE  Pertinent labs & imaging results that were available during my care of the patient were reviewed by me and considered in my medical decision making (see chart for details).   Patient presents emergency department for sharp right flank pain since yesterday.  Differential would include ureterolithiasis, renal colic, UTI or pyelonephritis, colitis or diverticulitis, appendicitis.  We will check labs, treat pain and nausea and continue to closely monitor.  We will proceed with CT imaging to further evaluate.  Patient  agreeable to plan of care.  CT shows mild bladder wall thickening urinalysis is negative.  Patient states his pain is gone.  Mild right-sided hydronephrosis of unknown origin we will have the patient follow-up with urology.  ARVLE GRABE was evaluated in Emergency Department on 06/12/2019 for the symptoms described in the history of present illness. He was evaluated in the context of the global COVID-19 pandemic, which necessitated consideration that the patient might be at risk for infection with the SARS-CoV-2 virus that causes COVID-19. Institutional protocols and algorithms that pertain to the evaluation of patients at risk for COVID-19 are in a state of rapid change based on information released by regulatory bodies including the CDC and federal and state organizations. These policies and algorithms were followed during the patient's care in the ED.  ____________________________________________   FINAL CLINICAL IMPRESSION(S) / ED DIAGNOSES  Right flank pain   Harvest Dark, MD 06/12/19 1713

## 2019-06-12 NOTE — ED Notes (Signed)
Patient reports right flank pain that started yesterday. Patient has MWF dialysis. States he had his treatment this morning, however pain is progressing. Patient states he does make urine and has not noticed any change in frequency or color.

## 2019-06-12 NOTE — ED Triage Notes (Signed)
Patient reports right flank pain that started yesterday. Patient has MWF dialysis. States he had his treatment this morning, however pain is progressing. Patient states he does make urine and has not noticed any change in frequency or color.

## 2019-06-12 NOTE — Progress Notes (Addendum)
GOTTFRIED, Scott [name]. ([id number]) Visit Report for 06/12/2019 Allergy List Details Patient Name: Scott [name], Scott [name]. Date of Service: 06/12/2019 1:15 PM Medical Record Number: [id number] Patient Account Number: [id number] Date of Birth/Sex: 1961/02/07 (58 y.o. M) Treating RN: Montey Hora Primary Care Leani Myron: Norville Haggard Other Clinician: Referring Gaytha Raybourn: Caroline More Treating Dinara Lupu/Extender: STONE III, HOYT Weeks in Treatment: 0 Allergies Active Allergies No Known Allergies Allergy Notes Electronic Signature(s) Signed: 06/12/2019 5:14:47 PM By: Montey Hora Entered By: Montey Hora on 06/12/2019 13:25:36 Scott [name], Scott [name]. ([id number]) -------------------------------------------------------------------------------- Arrival Information Details Patient Name: Scott [name]. Date of Service: 06/12/2019 1:15 PM Medical Record Number: [id number] Patient Account Number: [id number] Date of Birth/Sex: 27-Nov-1960 (58 y.o. M) Treating RN: Montey Hora Primary Care Adelbert Gaspard: Norville Haggard Other Clinician: Referring Kamiya Acord: Caroline More Treating Keldon Lassen/Extender: Melburn Hake, HOYT Weeks in Treatment: 0 Visit Information Patient Arrived: Ambulatory Arrival Time: 13:21 Accompanied By: friend Transfer Assistance: None Patient Identification Verified: Yes Secondary Verification Process Yes Completed: Patient Has Alerts: Yes Patient Alerts: Patient on Blood Thinner Effient aspirin 81 DMII History Since Last Visit Added or deleted any medications: No Any new allergies or adverse reactions: No Had [name] fall or experienced change in activities of daily living that may affect risk of falls: No Signs or symptoms of abuse/neglect since last visito No Hospitalized since last visit: No Implantable device outside of the clinic excluding cellular tissue based products placed in the center since last visit: No Has Dressing in Place as Prescribed: Yes Electronic  Signature(s) Signed: 06/12/2019 5:14:47 PM By: Montey Hora Entered By: Montey Hora on 06/12/2019 13:23:01 Scott [name], [place]. ([id number]) -------------------------------------------------------------------------------- Clinic Level of Care Assessment Details Patient Name: [name], Scott [name]. Date of Service: 06/12/2019 1:15 PM Medical Record Number: [id number] Patient Account Number: [id number] Date of Birth/Sex: [birth year] (58 y.o. M) Treating RN: Harold Barban Primary Care Zeus Marquis: Norville Haggard Other Clinician: Referring Dellene Mcgroarty: Caroline More Treating Kaybree Williams/Extender: STONE III, HOYT Weeks in Treatment: 0 Clinic Level of Care Assessment Items TOOL 2 Quantity Score X - Use when only an EandM is performed on the INITIAL visit 1 0 ASSESSMENTS - Nursing Assessment / Reassessment []  - General Physical Exam (combine w/ comprehensive assessment (listed just below) when 0 performed on new pt. evals) []  - 0 Comprehensive Assessment (HX, ROS, Risk Assessments, Wounds Hx, etc.) ASSESSMENTS - Wound and Skin Assessment / Reassessment X - Simple Wound Assessment / Reassessment - one wound 1 5 []  - 0 Complex Wound Assessment / Reassessment - multiple wounds []  - 0 Dermatologic / Skin Assessment (not related to wound area) ASSESSMENTS - Ostomy and/or Continence Assessment and Care []  - Incontinence Assessment and Management 0 []  - 0 Ostomy Care Assessment and Management (repouching, etc.) PROCESS - Coordination of Care X - Simple Patient / Family Education for ongoing care 1 15 []  - 0 Complex (extensive) Patient / Family Education for ongoing care []  - 0 Staff obtains Programmer, systems, Records, Test Results / Process Orders []  - 0 Staff telephones HHA, Nursing Homes / Clarify orders / etc []  - 0 Routine Transfer to another Facility (non-emergent condition) []  - 0 Routine Hospital Admission (non-emergent condition) []  - 0 New Admissions / Biomedical engineer / Ordering NPWT,  Apligraf, etc. []  - 0 Emergency Hospital Admission (emergent condition) X- 1 10 Simple Discharge Coordination []  - 0 Complex (extensive) Discharge Coordination PROCESS - Special Needs []  - Pediatric / Minor Patient Management 0 []  - 0 Isolation Patient Management [name], Scott [name]. ([id number]) []  - 0 Hearing / Language / Visual  special needs []  - 0 Assessment of Community assistance (transportation, D/C planning, etc.) []  - 0 Additional assistance / Altered mentation []  - 0 Support Surface(s) Assessment (bed, cushion, seat, etc.) INTERVENTIONS - Wound Cleansing / Measurement X - Wound Imaging (photographs - any number of wounds) 1 5 []  - 0 Wound Tracing (instead of photographs) X- 1 5 Simple Wound Measurement - one wound []  - 0 Complex Wound Measurement - multiple wounds X- 1 5 Simple Wound Cleansing - one wound []  - 0 Complex Wound Cleansing - multiple wounds INTERVENTIONS - Wound Dressings X - Small Wound Dressing one or multiple wounds 1 10 []  - 0 Medium Wound Dressing one or multiple wounds []  - 0 Large Wound Dressing one or multiple wounds []  - 0 Application of Medications - injection INTERVENTIONS - Miscellaneous []  - External ear exam 0 []  - 0 Specimen Collection (cultures, biopsies, blood, body fluids, etc.) []  - 0 Specimen(s) / Culture(s) sent or taken to Lab for analysis []  - 0 Patient Transfer (multiple staff / Civil Service fast streamer / Similar devices) []  - 0 Simple Staple / Suture removal (25 or less) []  - 0 Complex Staple / Suture removal (26 or more) []  - 0 Hypo / Hyperglycemic Management (close monitor of Blood Glucose) X- 1 15 Ankle / Brachial Index (ABI) - do not check if billed separately Has the patient been seen at the hospital within the last three years: Yes Total Score: 70 Level Of Care: New/Established - Level 2 Electronic Signature(s) Signed: 06/14/2019 4:37:37 PM By: Harold Barban Entered By: Harold Barban on 06/12/2019 14:12:56 Scott [name],  Scott [name]. ([id number]) -------------------------------------------------------------------------------- Encounter Discharge Information Details Patient Name: Scott [name], Scott [name]. Date of Service: 06/12/2019 1:15 PM Medical Record Number: [id number] Patient Account Number: [id number] Date of Birth/Sex: 17-Sep-1960 (58 y.o. M) Treating RN: Harold Barban Primary Care Alvine Mostafa: Norville Haggard Other Clinician: Referring Konnor Vondrasek: Caroline More Treating Tamyia Minich/Extender: Melburn Hake, HOYT Weeks in Treatment: 0 Encounter Discharge Information Items Discharge Condition: Stable Ambulatory Status: Ambulatory Discharge Destination: Home Transportation: Private Auto Accompanied By: family Schedule Follow-up Appointment: Yes Clinical Summary of Care: Electronic Signature(s) Signed: 06/14/2019 4:37:37 PM By: Harold Barban Entered By: Harold Barban on 06/12/2019 14:15:51 Scott [name], Scott [name]. ([id number]) -------------------------------------------------------------------------------- Lower Extremity Assessment Details Patient Name: Scott [name], Scott [name]. Date of Service: 06/12/2019 1:15 PM Medical Record Number: [id number] Patient Account Number: [id number] Date of Birth/Sex: 1961/02/20 (58 y.o. M) Treating RN: Montey Hora Primary Care Quincie Haroon: Norville Haggard Other Clinician: Referring Emma-Lee Oddo: Caroline More Treating Kaidynce Pfister/Extender: STONE III, HOYT Weeks in Treatment: 0 Edema Assessment Assessed: [Left: No] [Right: No] Edema: [Left: Ye] [Right: s] Vascular Assessment Pulses: Dorsalis Pedis Palpable: [Left:No] Doppler Audible: [Left:Yes] Posterior Tibial Palpable: [Left:No Yes] Notes See ABI report from [facility] on 05/23/2019 during patient's most recent hospital stay Electronic Signature(s) Signed: 06/12/2019 5:14:47 PM By: Montey Hora Entered By: Montey Hora on 06/12/2019 13:37:57 Scott [name], Scott [name].  ([id number]) -------------------------------------------------------------------------------- Multi Wound Chart Details Patient Name: Scott [name], Sohaib [name]. Date of Service: 06/12/2019 1:15 PM Medical Record Number: [id number] Patient Account Number: [id number] Date of Birth/Sex: 1961-02-04 (58 y.o. 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 Comorbid History: Congestive Heart Failure, N/[name] N/[name] Coronary Artery Disease, Hypertension, Peripheral Venous Disease, Type II Diabetes, End Stage Renal Disease, Osteoarthritis, Neuropathy, Confinement Anxiety Date Acquired: 05/24/2019 N/[name] N/[name] Weeks of Treatment: 0 N/[name] N/[name] Wound Status: Open N/[name] N/[name] Pending Amputation on Yes N/[name] N/[name] Presentation: Measurements L x W x D 7x3x0.1 N/[name] N/[name] (cm) Area (cm) : 16.493 N/[name] N/[name] Volume (cm) : 1.649 N/[name] N/[name] % Reduction in Area: 0.00% N/[name] N/[name] % Reduction in Volume: 0.00% N/[name] N/[name] Classification: Partial Thickness N/[name] N/[name] Exudate Amount: Medium N/[name] N/[name] Exudate Type: Serous N/[name] N/[name] Exudate Color: amber N/[name] N/[name] Wound Margin: Flat and Intact N/[name] N/[name] Scott [name], Scott [name]. ([id number]) Granulation Amount: Small (1-33%) N/[name] N/[name] Necrotic Amount: Large (67-100%) N/[name] N/[name] Necrotic Tissue: Eschar, Adherent Slough N/[name] N/[name] Exposed Structures: Fat Layer (Subcutaneous N/[name] N/[name] Tissue) Exposed: Yes Fascia: No Tendon: No Muscle: No Joint: No Bone: No Epithelialization: None N/[name]  N/[name] Assessment Notes: 9 sutures intact N/[name] N/[name] Treatment Notes Electronic Signature(s) Signed: 06/14/2019 4:37:37 PM By: Harold Barban Entered By: Harold Barban on 06/12/2019 13:56:58 Scott [name], Scott [name]. ([id number]) -------------------------------------------------------------------------------- [place] Details Patient Name: Scott [name]. Date of Service: 06/12/2019 1:15 PM Medical Record Number: [id number] Patient Account Number: [id number] Date of Birth/Sex: April 07, 1961 (58 y.o. M) Treating RN: Harold Barban Primary Care Angelin Cutrone: Norville Haggard Other Clinician: Referring Gazella Anglin: Caroline More Treating Domonic Kimball/Extender: Melburn Hake, HOYT Weeks in Treatment: 0 Active Inactive Necrotic Tissue Nursing Diagnoses: Impaired tissue integrity related to necrotic/devitalized tissue Goals: Necrotic/devitalized tissue will be minimized in the wound bed Date Initiated: 06/12/2019 Target Resolution Date: 07/13/2019 Goal Status: Active Patient/caregiver will verbalize understanding of reason and process for debridement of necrotic tissue Date Initiated: 06/12/2019 Target Resolution Date: 06/12/2019 Goal Status: Active Interventions: Assess patient pain level pre-, during and post procedure and prior to discharge Provide education on necrotic tissue and debridement process Treatment Activities: Apply topical anesthetic as ordered : 06/12/2019 Notes: Wound/Skin Impairment Nursing Diagnoses: Impaired tissue integrity Knowledge deficit related to ulceration/compromised skin integrity Goals: Ulcer/skin breakdown will have [name] volume reduction of 30% by week 4 Date Initiated: 06/12/2019 Target Resolution Date: 07/13/2019 Goal Status: Active Interventions: Assess patient/caregiver ability to obtain necessary supplies Assess patient/caregiver ability to perform ulcer/skin care regimen upon admission and as needed Assess ulceration(s) every  visit Notes: Electronic Signature(s) Scott [name], Scott [name]. ([id number]) Signed: 06/14/2019 4:37:37 PM By: Harold Barban Entered By: Harold Barban on 06/12/2019 13:53:27 Strawser, Dresean [name]. ([id number]) -------------------------------------------------------------------------------- Pain Assessment Details Patient Name: Mignogna, Elihu [name]. Date of Service: 06/12/2019 1:15 PM Medical Record Number: [id number] Patient Account Number: [id number] Date of Birth/Sex: 19-Jan-1961 (58 y.o. M) Treating RN: Montey Hora Primary Care Asmaa Tirpak: Norville Haggard Other Clinician: Referring Dequavius Kuhner: Caroline More Treating Martina Brodbeck/Extender: STONE III, HOYT Weeks in Treatment: 0 Active Problems Location of Pain Severity and Description of Pain Patient Has Paino No Site Locations Pain Management and Medication Current Pain Management: Electronic Signature(s) Signed: 06/12/2019 5:14:47 PM By: Montey Hora Entered By: Montey Hora on 06/12/2019 13:23:27 Scott [name], Scott [name]. ([id number]) -------------------------------------------------------------------------------- Patient/Caregiver Education Details Patient Name: Scott [name]. Date of Service: 06/12/2019 1:15 PM Medical Record Number: [id number] Patient Account Number: [id number] Date of Birth/Gender: 10-21-1960 (58 y.o. M) Treating RN: Harold Barban Primary Care Physician: Norville Haggard Other Clinician: Referring Physician: Caroline More Treating Physician/Extender: Melburn Hake, HOYT Weeks in Treatment: 0 Education Assessment Education Provided To: Patient Education Topics Provided Tissue Oxygenation: Handouts: Peripheral Arterial Disease and Related Ulcers Methods: Demonstration, Explain/Verbal Responses: State content correctly  Wound/Skin Impairment: Handouts: Caring for Your Ulcer Methods: Demonstration, Explain/Verbal Responses: State content correctly Electronic Signature(s) Signed: 06/14/2019 4:37:37 PM By: Harold Barban Entered By: Harold Barban on 06/12/2019 13:57:54 Val, Findlay [name]. ([id number]) -------------------------------------------------------------------------------- Wound Assessment Details Patient Name: Hunnicutt, Lenton [name]. Date of Service: 06/12/2019 1:15 PM Medical Record Number: [id number] Patient Account Number: [id number] Date of Birth/Sex: 1960-10-10 (58 y.o. M) Treating RN: Montey Hora Primary Care Cordarrell Sane: Norville Haggard Other Clinician: Referring Athalia Setterlund: Caroline More Treating Renly Roots/Extender: STONE III, HOYT Weeks in Treatment: 0 Wound Status Wound Number: 4 Primary Open Surgical Wound Etiology: Wound Location: Left Amputation Site - Toe Secondary Diabetic Wound/Ulcer of the Lower Extremity Wounding Event: Surgical Injury Etiology: Date Acquired: 05/24/2019 Wound Open Weeks Of Treatment: 0 Status: Clustered Wound: No Comorbid Congestive Heart Failure, Coronary Artery Pending Amputation On Presentation History: Disease, Hypertension, Peripheral Venous Disease, Type II Diabetes, End Stage Renal Disease, Osteoarthritis, Neuropathy, Confinement Anxiety Photos Wound Measurements Length: (cm) 7 % Reduction Width: (cm) 3 % Reduction Depth: (cm) 0.1 Epitheliali Area: (cm) 16.493 Tunneling: Volume: (cm) 1.649 Underminin in Area: 0% in Volume: 0% zation: None No g: No Wound Description Classification: Partial Thickness Foul Odor [name] Wound Margin: Flat and Intact Slough/Fibr Exudate Amount: Medium Exudate Type: Serous Exudate Color: amber fter Cleansing: No ino Yes Wound Bed Granulation Amount: Small (1-33%) Exposed Structure Necrotic Amount: Large (67-100%) Fascia Exposed: No Necrotic Quality: Eschar, Adherent Slough Fat Layer (Subcutaneous Tissue) Exposed: Yes Tendon Exposed: No Muscle Exposed: No Joint Exposed: No Kachmar, Seyon [name]. ([id number]) Bone Exposed: No Assessment Notes 9 sutures intact Treatment Notes Wound #4 (Left Amputation Site  - Toe) Notes Betadine, silver alginate, ABD, Conform Electronic Signature(s) Signed: 06/12/2019 5:11:24 PM By: Harold Barban Signed: 06/12/2019 5:14:47 PM By: Montey Hora Previous Signature: 06/12/2019 1:49:10 PM Version By: Worthy Keeler PA-C Entered By: Harold Barban on 06/12/2019 17:11:24 Cabler, Dru [name]. ([id number]) -------------------------------------------------------------------------------- Vitals Details Patient Name: Spicher, Jaciel [name]. Date of Service: 06/12/2019 1:15 PM Medical Record Number: [id number] Patient Account Number: [id number] Date of Birth/Sex: [birth year] (58 y.o. M) Treating RN: Montey Hora Primary Care Jyden Kromer: Alison [facility], [facility] Other Clinician: Referring Gustava Berland: Caroline More Treating Norberta Stobaugh/Extender: STONE III, HOYT Weeks in Treatment: 0 Vital Signs Time Taken: 13:24 Temperature (F): 98.1 Height (in): 69 Pulse (bpm): 79 Source: Measured Respiratory Rate (breaths/min): 18 Weight (lbs): 215 Blood Pressure (mmHg): 152/34 Source: Measured Reference Range: 80 - 120 mg / dl Body Mass Index (BMI): 31.7 Electronic Signature(s) Signed: 06/12/2019 5:14:47 PM By: Montey Hora Entered By: Montey Hora on 06/12/2019 13:38:56

## 2019-06-13 ENCOUNTER — Encounter (INDEPENDENT_AMBULATORY_CARE_PROVIDER_SITE_OTHER): Payer: Self-pay | Admitting: Nurse Practitioner

## 2019-06-13 NOTE — Progress Notes (Signed)
SUBJECTIVE:  Patient ID: Scott [name], male    DOB: 11/26/1960, 58 y.o.   MRN: [id number] Chief Complaint  Patient presents with   Follow-up    HPI  Scott [name] is a 58 y.o. male The patient returns to the office for followup and review status post angiogram with intervention. The patient notes improvement in the lower extremity symptoms. No interval shortening of the patient's claudication distance or rest pain symptoms.  No new ulcers or wounds have occurred since the last visit, however the wound has not progressively healed.  The patient and his wife state that the wound is being slow to heal.  There have been no significant changes to the patient's overall health care.  The patient denies amaurosis fugax or recent TIA symptoms. There are no recent neurological changes noted. The patient denies history of DVT, PE or superficial thrombophlebitis. The patient denies recent episodes of angina or shortness of breath.   ABI's Rt=[place] and Lt=[place]  (no previous ABIs) Duplex US of the tibial arteries show monophasic waveforms bilaterally.  The right digit is dampened and the left is nearly flat.  Past Medical History:  Diagnosis Date   Arthritis    "left arm; right leg" (12/13/2014)   Asthma    CHF (congestive heart failure) (HCC)    Chronic disease anemia    /notes 12/13/2014   Chronic kidney disease (CKD), stage IV (severe) ([place])    [name] 12/13/2014... on dialysis   Complication of anesthesia    unable to urinate after CAPD urgery   Continuous ambulatory peritoneal dialysis status [facility])    Coronary artery disease    /notes 12/13/2014   Depression    Dysrhythmia    patient unaware of irregular heartbeat   GERD (gastroesophageal reflux disease)    High cholesterol    [name] 12/13/2014   Hypertension    Non-Q wave myocardial infarction [facility])    [name] 12/13/2014   PVD (peripheral vascular disease) ([place])    [name] 12/13/2014   Type II diabetes mellitus ([place])      Past Surgical History:  Procedure Laterality Date   AMPUTATION TOE Left 05/24/2019   Procedure: Left great toe amputation;  Surgeon: Caroline More, DPM;  Location: [facility] ORS;  Service: Podiatry;  Laterality: Left;   AV FISTULA PLACEMENT Right 04/07/2017   Procedure: ARTERIOVENOUS (AV) FISTULA CREATION ( BRACHIALCEPHALIC );  Surgeon: Katha Cabal, MD;  Location: [facility] ORS;  Service: Vascular;  Laterality: Right;   CAPD INSERTION N/A 04/07/2017   Procedure: LAPAROSCOPIC INSERTION CONTINUOUS AMBULATORY PERITONEAL DIALYSIS  (CAPD) CATHETER;  Surgeon: Katha Cabal, MD;  Location: [facility] ORS;  Service: Vascular;  Laterality: N/A;   CARDIAC CATHETERIZATION  11/2014   COLONOSCOPY WITH PROPOFOL N/A 12/01/2017   Procedure: COLONOSCOPY WITH PROPOFOL;  Surgeon: Lin Landsman, MD;  Location: [place];  Service: Endoscopy;  Laterality: N/A;  Diabetic - insulin   COLONOSCOPY WITH PROPOFOL N/A 12/29/2017   Procedure: COLONOSCOPY WITH PROPOFOL;  Surgeon: Lin Landsman, MD;  Location: [place];  Service: Endoscopy;  Laterality: N/A;   DIALYSIS/PERMA CATHETER INSERTION N/A 01/18/2017   Procedure: Dialysis/Perma Catheter Insertion;  Surgeon: Algernon Huxley, MD;  Location: [place] CV LAB;  Service: Cardiovascular;  Laterality: N/A;   DIALYSIS/PERMA CATHETER REMOVAL N/A 07/26/2017   Procedure: DIALYSIS/PERMA CATHETER REMOVAL;  Surgeon: Algernon Huxley, MD;  Location: [place] CV LAB;  Service: Cardiovascular;  Laterality: N/A;   INCISION AND DRAINAGE OF WOUND Right ~ 10/2014   "  4th toe foot"   LOWER EXTREMITY ANGIOGRAPHY Left 05/23/2019   Procedure: Lower Extremity Angiography;  Surgeon: Katha Cabal, MD;  Location: [place] CV LAB;  Service: Cardiovascular;  Laterality: Left;   PERCUTANEOUS CORONARY STENT INTERVENTION (PCI-S) N/A 12/14/2014   Procedure: PERCUTANEOUS CORONARY STENT INTERVENTION (PCI-S);  Surgeon: Charolette Forward, MD;  Location: [facility] CATH LAB;   Service: Cardiovascular;  Laterality: N/A;   PERCUTANEOUS CORONARY STENT INTERVENTION (PCI-S) N/A 12/18/2014   Procedure: PERCUTANEOUS CORONARY STENT INTERVENTION (PCI-S);  Surgeon: Charolette Forward, MD;  Location: [facility] CATH LAB;  Service: Cardiovascular;  Laterality: N/A;   POLYPECTOMY  12/01/2017   Procedure: POLYPECTOMY;  Surgeon: Lin Landsman, MD;  Location: [place];  Service: Endoscopy;;   POLYPECTOMY  12/29/2017   Procedure: POLYPECTOMY;  Surgeon: Lin Landsman, MD;  Location: [place];  Service: Endoscopy;;   TOE AMPUTATION Right 2015   4th toe    Social History   Socioeconomic History   Marital status: Significant Other    Spouse name: Not on file   Number of children: Not on file   Years of education: Not on file   Highest education level: Not on file  Occupational History   Not on file  Social Needs   Financial resource strain: Not on file   Food insecurity    Worry: Not on file    Inability: Not on file   Transportation needs    Medical: Not on file    Non-medical: Not on file  Tobacco Use   Smoking status: Former Smoker    Packs/day: 1.00    Years: 30.00    Pack years: 30.00    Types: Cigarettes    Start date: 08/31/1984    Quit date: 11/30/2014    Years since quitting: 4.5   Smokeless tobacco: Never Used  Substance and Sexual Activity   Alcohol use: Yes    Comment: Rarely, twice a year.   Drug use: No   Sexual activity: Not Currently  Lifestyle   Physical activity    Days per week: Not on file    Minutes per session: Not on file   Stress: Not on file  Relationships   Social connections    Talks on phone: Not on file    Gets together: Not on file    Attends religious service: Not on file    Active member of club or organization: Not on file    Attends meetings of clubs or organizations: Not on file    Relationship status: Not on file   Intimate partner violence    Fear of current or ex partner: Not on  file    Emotionally abused: Not on file    Physically abused: Not on file    Forced sexual activity: Not on file  Other Topics Concern   Not on file  Social History Narrative   Lives at home with girlfriend and son    Family History  Problem Relation Age of Onset   Cancer Mother        Lung   Cancer Father        Lung   Diabetes Sister    Diabetes Brother    CAD Brother    Hernia Son    Cancer Brother     No Known Allergies   Review of Systems   Review of Systems: Negative Unless Checked Constitutional: [] Weight loss  [] Fever  [] Chills Cardiac: [] Chest pain   []  Atrial Fibrillation  [] Palpitations   []   Shortness of breath when laying flat   [] Shortness of breath with exertion. [] Shortness of breath at rest Vascular:  [] Pain in legs with walking   [] Pain in legs with standing [] Pain in legs when laying flat   [] Claudication    [] Pain in feet when laying flat    [] History of DVT   [] Phlebitis   [] Swelling in legs   [] Varicose veins   [x] Non-healing ulcers Pulmonary:   [] Uses home oxygen   [] Productive cough   [] Hemoptysis   [] Wheeze  [] COPD   [x] Asthma Neurologic:  [] Dizziness   [] Seizures  [] Blackouts [] History of stroke   [] History of TIA  [] Aphasia   [] Temporary Blindness   [] Weakness or numbness in arm   [] Weakness or numbness in leg Musculoskeletal:   [] Joint swelling   [] Joint pain   [] Low back pain  []  History of Knee Replacement [] Arthritis [] back Surgeries  []  Spinal Stenosis    Hematologic:  [] Easy bruising  [] Easy bleeding   [] Hypercoagulable state   [x] Anemic Gastrointestinal:  [] Diarrhea   [] Vomiting  [x] Gastroesophageal reflux/heartburn   [] Difficulty swallowing. [] Abdominal pain Genitourinary:  [x] Chronic kidney disease   [] Difficult urination  [] Anuric   [] Blood in urine [] Frequent urination  [] Burning with urination   [] Hematuria Skin:  [] Rashes   [x] Ulcers [x] Wounds Psychological:  [] History of anxiety   []  History of major depression  []  Memory  Difficulties      OBJECTIVE:   Physical Exam  BP (!) 156/78 (BP Location: Left Arm, Patient Position: Sitting, Cuff Size: Normal)    Pulse 84    Resp 12    Ht 5\' 9"  (1.753 m)    Wt 215 lb (97.5 kg)    BMI 31.75 kg/m   Gen: WD/WN, NAD Head: [place]/AT, No temporalis wasting.  Ear/Nose/Throat: Hearing grossly intact, nares w/o erythema or drainage Eyes: PER, EOMI, sclera nonicteric.  Neck: Supple, no masses.  No JVD.  Pulmonary:  Good air movement, no use of accessory muscles.  Cardiac: RRR Vascular:  Vessel Right Left  Radial Palpable Palpable  Dorsalis Pedis Not Palpable Not Palpable  Posterior Tibial Not Palpable Not Palpable   Gastrointestinal: soft, non-distended. No guarding/no peritoneal signs.  Musculoskeletal: M/S 5/5 throughout.  No deformity or atrophy.  Neurologic: Pain and light touch intact in extremities.  Symmetrical.  Speech is fluent. Motor exam as listed above. Psychiatric: Judgment intact, Mood & affect appropriate for pt's clinical situation. Dermatologic: No Venous rashes. No Ulcers Noted.  No changes consistent with cellulitis. Lymph : No Cervical lymphadenopathy, no lichenification or skin changes of chronic lymphedema.       ASSESSMENT AND PLAN:  1. Peripheral vascular disease ([place]) I discussed the possibility of repeat angiogram with the patient and his wife.  However, the patient and wife are apprehensive to proceed with a second angiogram so soon after the first.  The patient's ABIs were noncompressible today so we will have the patient return in 1 month in order to reevaluate studies as well as to see if he is able to make some progression with hyperbaric wound therapy as well as time for him to see the cardiologist as well.  The patient instructed to contact us sooner if the wound deteriorates or there other signs and symptoms of ischemia. - VAS [place] LOWER EXTREMITY ARTERIAL DUPLEX; Future  2. Chronic systolic congestive heart failure ([place]) The patient  notes that he does have heart failure and states he has not had frequent follow-up for this.  The patient currently has a cardiologist  however when we attempted to facilitate an appointment with his current cardiologist the patient refused.  The patient requested to be referred to a different office. - Ambulatory referral to Cardiology  3. Gastroesophageal reflux disease, unspecified whether esophagitis present Continue PPI as already ordered, this medication has been reviewed and there are no changes at this time.  Avoidence of caffeine and alcohol  Moderate elevation of the head of the bed   4. ESRD (end stage renal disease) ([place]) Patient is currently on peritoneal dialysis.  The patient is currently still able to produce urine which factored into this decision to hold off on his angiogram.  This is due to the fact that the patient's ability to produce urine helps with his fluid balance.  Therefore we will like to hold off on an angiogram as much as possible.    Current Outpatient Medications on File Prior to Visit  Medication Sig Dispense Refill   albuterol (PROVENTIL HFA;VENTOLIN HFA) 108 (90 BASE) MCG/ACT inhaler Inhale 2 puffs into the lungs every 6 (six) hours as needed for wheezing or shortness of breath. 1 Inhaler 2   amLODipine (NORVASC) 10 MG tablet Take 10 mg by mouth daily.     amoxicillin-clavulanate (AUGMENTIN) 500-125 MG tablet TAKE 1 TABLET BY MOUTH TWICE A DAY FOR 7 DAYS     aspirin EC 81 MG tablet Take 1 tablet (81 mg total) by mouth daily. 30 tablet 0   atorvastatin (LIPITOR) 40 MG tablet Take 40 mg by mouth daily.     B Complex-C-Folic Acid (DIALYVITE 967) 0.8 MG TABS Take 1 tablet by mouth daily.  6   calcitRIOL (ROCALTROL) 0.25 MCG capsule Take 0.25 mcg by mouth daily.     calcium acetate (PHOSLO) 667 MG capsule Take 2,001 mg by mouth 3 (three) times daily with meals. Takes 1-2 caps if he has snacks     carvedilol (COREG) 25 MG tablet Take 1 tablet (25 mg  total) by mouth 2 (two) times daily with a meal. 180 tablet 3   Cholecalciferol (VITAMIN D) 2000 units tablet Take 2,000 Units by mouth daily. Unsure how many units     clopidogrel (PLAVIX) 75 MG tablet Take 1 tablet (75 mg total) by mouth daily with breakfast. 30 tablet 2   furosemide (LASIX) 80 MG tablet Take 80 mg by mouth daily.     gentamicin cream (GARAMYCIN) 0.1 % Apply 1 application topically daily.     insulin aspart protamine- aspart (NOVOLOG MIX 70/30) (70-30) 100 UNIT/ML injection Inject 0.25 mLs (25 Units total) into the skin 2 (two) times daily with a meal. (Patient taking differently: Inject 20-25 Units into the skin 2 (two) times daily with a meal. ) 20 mL 1   nitroGLYCERIN (NITROSTAT) 0.4 MG SL tablet Place 0.4 mg under the tongue every 5 (five) minutes as needed for chest pain. Pt needs new Rx. Bottle has expried     omeprazole (PRILOSEC) 20 MG capsule Take 1 capsule (20 mg total) by mouth daily. 30 capsule 0   ONE TOUCH ULTRA TEST test strip      hydrALAZINE (APRESOLINE) 25 MG tablet Take 1 tablet (25 mg total) by mouth 3 (three) times daily. (Patient taking differently: Take 25 mg by mouth 3 (three) times daily. Pt states he only takes once a day) 90 tablet 1   No current facility-administered medications on file prior to visit.     There are no Patient Instructions on file for this visit. No follow-ups on file.  Kris Hartmann, NP  This note was completed with Sales executive.  Any errors are purely unintentional.

## 2019-06-13 NOTE — Progress Notes (Signed)
BERL, BONFANTI A. ([id number]) Visit Report for 06/12/2019 Abuse/Suicide Risk Screen Details Patient Name: Konitzer, Zhi A. Date of Service: 06/12/2019 1:15 PM Medical Record Number: [id number] Patient Account Number: [id number] Date of Birth/Sex: 1961-01-13 (58 y.o. M) Treating RN: Montey Hora Primary Care Kery Haltiwanger: Norville Haggard Other Clinician: Referring Ajdin Macke: Caroline More Treating Natascha Edmonds/Extender: STONE III, HOYT Weeks in Treatment: 0 Abuse/Suicide Risk Screen Items Answer ABUSE RISK SCREEN: Has anyone close to you tried to hurt or harm you recentlyo No Do you feel uncomfortable with anyone in your familyo No Has anyone forced you do things that you didnot want to doo No Electronic Signature(s) Signed: 06/12/2019 5:14:47 PM By: Montey Hora Entered By: Montey Hora on 06/12/2019 13:29:40 Stander, Sharrod A. ([id number]) -------------------------------------------------------------------------------- Activities of Daily Living Details Patient Name: Cozby, Shin A. Date of Service: 06/12/2019 1:15 PM Medical Record Number: [id number] Patient Account Number: [id number] Date of Birth/Sex: March 21, 1961 (58 y.o. M) Treating RN: Montey Hora Primary Care Estoria Geary: Norville Haggard Other Clinician: Referring Sharlisa Hollifield: Caroline More Treating Paulena Servais/Extender: STONE III, HOYT Weeks in Treatment: 0 Activities of Daily Living Items Answer Activities of Daily Living (Please select one for each item) Drive Automobile Not Able Take Medications Completely Able Use Telephone Completely Able Care for Appearance Completely Able Use Toilet Completely Able Bath / Shower Completely Able Dress Self Completely Able Feed Self Completely Able Walk Completely Able Get In / Out Bed Completely Able Housework Completely Able Prepare Meals Completely Central for Self Need Assistance Electronic Signature(s) Signed: 06/12/2019 5:14:47 PM By: Montey Hora Entered By: Montey Hora on 06/12/2019 13:30:53 Grillo, Edwardo A. ([id number]) -------------------------------------------------------------------------------- Education Screening Details Patient Name: [name] Date of Service: 06/12/2019 1:15 PM Medical Record Number: [id number] Patient Account Number: [id number] Date of Birth/Sex: 1961-01-15 (58 y.o. M) Treating RN: Montey Hora Primary Care Worley Radermacher: Norville Haggard Other Clinician: Referring Miriana Gaertner: Caroline More Treating Naylah Cork/Extender: Melburn Hake, HOYT Weeks in Treatment: 0 Primary Learner Assessed: Patient Learning Preferences/Education Level/Primary Language Learning Preference: Explanation, Demonstration Highest Education Level: High School Preferred Language: English Cognitive Barrier Language Barrier: No Translator Needed: No Memory Deficit: No Emotional Barrier: No Cultural/Religious Beliefs Affecting Medical Care: No Physical Barrier Impaired Vision: No Impaired Hearing: No Decreased Hand dexterity: No Knowledge/Comprehension Knowledge Level: Medium Comprehension Level: Medium Ability to understand written Medium instructions: Ability to understand verbal Medium instructions: Motivation Anxiety Level: Calm Cooperation: Cooperative Education Importance: Acknowledges Need Interest in Health Problems: Asks Questions Perception: Coherent Willingness to Engage in Self- Medium Management Activities: Readiness to Engage in Self- Medium Management Activities: Electronic Signature(s) Signed: 06/12/2019 5:14:47 PM By: Montey Hora Entered By: Montey Hora on 06/12/2019 13:33:09 Stamos, Nazir A. ([id number]) -------------------------------------------------------------------------------- Fall Risk Assessment Details Patient Name: Dimmick, Richad A. Date of Service: 06/12/2019 1:15 PM Medical Record Number: [id number] Patient Account Number: [id number] Date of Birth/Sex: [birth year] (58  y.o. M) Treating RN: Montey Hora Primary Care Donie Lemelin: Alison Stalling, [facility] Other Clinician: Referring Korrina Zern: Caroline More Treating Nary Sneed/Extender: Melburn Hake, HOYT Weeks in Treatment: 0 Fall Risk Assessment Items Have you had 2 or more falls in the last 12 monthso 0 No Have you had any fall that resulted in injury in the last 12 monthso 0 No FALLS RISK SCREEN History of falling - immediate or within 3 months 0 No Secondary diagnosis (Do you have 2 or more medical diagnoseso) 0 No Ambulatory aid None/bed rest/wheelchair/nurse 0 Yes Crutches/cane/walker 15 Yes Furniture 0 No Intravenous therapy Access/Saline/Heparin Lock 0 No Gait/Transferring Normal/ bed rest/ wheelchair 0 Yes Weak (  short steps with or without shuffle, stooped but able to lift head while 0 No walking, may seek support from furniture) Impaired (short steps with shuffle, may have difficulty arising from chair, head 0 No down, impaired balance) Mental Status Oriented to own ability 0 Yes Electronic Signature(s) Signed: 06/12/2019 5:14:47 PM By: Montey Hora Entered By: Montey Hora on 06/12/2019 13:33:39 Deas, Maynard A. ([id number]) -------------------------------------------------------------------------------- Foot Assessment Details Patient Name: Kincade, Aliou A. Date of Service: 06/12/2019 1:15 PM Medical Record Number: [id number] Patient Account Number: [id number] Date of Birth/Sex: April 16, 1961 (58 y.o. M) Treating RN: Montey Hora Primary Care Sina Lucchesi: Norville Haggard Other Clinician: Referring Rodolphe Edmonston: Caroline More Treating Corbin Hott/Extender: STONE III, HOYT Weeks in Treatment: 0 Foot Assessment Items Site Locations + = Sensation present, - = Sensation absent, C = Callus, U = Ulcer R = Redness, W = Warmth, M = Maceration, PU = Pre-ulcerative lesion F = Fissure, S = Swelling, D = Dryness Assessment Right: Left: Other Deformity: No No Prior Foot Ulcer: No No Prior Amputation: No  No Charcot Joint: No No Ambulatory Status: Ambulatory Without Help Gait: Steady Electronic Signature(s) Signed: 06/12/2019 5:14:47 PM By: Montey Hora Entered By: Montey Hora on 06/12/2019 13:38:35 Geissinger, Buster A. ([id number]) -------------------------------------------------------------------------------- Nutrition Risk Screening Details Patient Name: Pina, Maximus A. Date of Service: 06/12/2019 1:15 PM Medical Record Number: [id number] Patient Account Number: [id number] Date of Birth/Sex: Jan 26, 1961 (58 y.o. M) Treating RN: Montey Hora Primary Care Brodee Mauritz: Norville Haggard Other Clinician: Referring Ashika Apuzzo: Caroline More Treating Arthelia Callicott/Extender: STONE III, HOYT Weeks in Treatment: 0 Height (in): 69 Weight (lbs): 215 Body Mass Index (BMI): 31.7 Nutrition Risk Screening Items Score Screening NUTRITION RISK SCREEN: I have an illness or condition that made me change the kind and/or amount of 0 No food I eat I eat fewer than two meals per day 3 Yes I eat few fruits and vegetables, or milk products 0 No I have three or more drinks of beer, liquor or wine almost every day 0 No I have tooth or mouth problems that make it hard for me to eat 0 No I don't always have enough money to buy the food I need 0 No I eat alone most of the time 0 No I take three or more different prescribed or over-the-counter drugs a day 1 Yes Without wanting to, I have lost or gained 10 pounds in the last six months 0 No I am not always physically able to shop, cook and/or feed myself 0 No Nutrition Protocols Good Risk Protocol Provide education on Moderate Risk Protocol 0 nutrition High Risk Proctocol Risk Level: Moderate Risk Score: 4 Electronic Signature(s) Signed: 06/12/2019 5:14:47 PM By: Montey Hora Entered By: Montey Hora on 06/12/2019 13:34:03

## 2019-06-15 ENCOUNTER — Telehealth: Payer: Self-pay | Admitting: Urology

## 2019-06-15 NOTE — Telephone Encounter (Signed)
Pt has been scheduled for an ER follow up for Hydronephrosis I called him to give him his appt. 07/11/2019 (1st available for new pt) He states that he can not wait that long.He states that the pain is unbearable. I told him that we could not give him medical advise.

## 2019-06-16 NOTE — Telephone Encounter (Signed)
Patient called back today and there was an opening in [place] so I moved him.

## 2019-06-18 ENCOUNTER — Encounter: Payer: Self-pay | Admitting: Podiatry

## 2019-06-19 ENCOUNTER — Other Ambulatory Visit: Payer: Self-pay

## 2019-06-19 ENCOUNTER — Encounter: Payer: Medicare Other | Admitting: Physician Assistant

## 2019-06-19 ENCOUNTER — Encounter: Payer: Self-pay | Admitting: Urology

## 2019-06-19 ENCOUNTER — Ambulatory Visit: Payer: Medicare Other | Admitting: Urology

## 2019-06-19 ENCOUNTER — Other Ambulatory Visit
Admission: RE | Admit: 2019-06-19 | Discharge: 2019-06-19 | Disposition: A | Discharge status: Home / Self Care | Payer: Medicare Other | Attending: Urology | Admitting: Urology

## 2019-06-19 VITALS — BP 128/65 | HR 82 | Ht 69.0 in | Wt 187.0 lb

## 2019-06-19 DIAGNOSIS — R3129 Other microscopic hematuria: Secondary | ICD-10-CM

## 2019-06-19 DIAGNOSIS — E785 Hyperlipidemia, unspecified: Secondary | ICD-10-CM | POA: Diagnosis not present

## 2019-06-19 DIAGNOSIS — N186 End stage renal disease: Secondary | ICD-10-CM | POA: Diagnosis not present

## 2019-06-19 DIAGNOSIS — Z87891 Personal history of nicotine dependence: Secondary | ICD-10-CM

## 2019-06-19 DIAGNOSIS — I509 Heart failure, unspecified: Secondary | ICD-10-CM | POA: Diagnosis not present

## 2019-06-19 DIAGNOSIS — E1142 Type 2 diabetes mellitus with diabetic polyneuropathy: Secondary | ICD-10-CM | POA: Diagnosis not present

## 2019-06-19 DIAGNOSIS — N1330 Unspecified hydronephrosis: Secondary | ICD-10-CM

## 2019-06-19 DIAGNOSIS — Z992 Dependence on renal dialysis: Secondary | ICD-10-CM | POA: Diagnosis not present

## 2019-06-19 DIAGNOSIS — Z7901 Long term (current) use of anticoagulants: Secondary | ICD-10-CM | POA: Diagnosis not present

## 2019-06-19 DIAGNOSIS — R109 Unspecified abdominal pain: Secondary | ICD-10-CM

## 2019-06-19 DIAGNOSIS — F419 Anxiety disorder, unspecified: Secondary | ICD-10-CM | POA: Diagnosis not present

## 2019-06-19 DIAGNOSIS — L97522 Non-pressure chronic ulcer of other part of left foot with fat layer exposed: Secondary | ICD-10-CM | POA: Diagnosis not present

## 2019-06-19 DIAGNOSIS — E1151 Type 2 diabetes mellitus with diabetic peripheral angiopathy without gangrene: Secondary | ICD-10-CM | POA: Diagnosis not present

## 2019-06-19 DIAGNOSIS — M1990 Unspecified osteoarthritis, unspecified site: Secondary | ICD-10-CM | POA: Diagnosis not present

## 2019-06-19 DIAGNOSIS — I132 Hypertensive heart and chronic kidney disease with heart failure and with stage 5 chronic kidney disease, or end stage renal disease: Secondary | ICD-10-CM | POA: Diagnosis not present

## 2019-06-19 DIAGNOSIS — E11621 Type 2 diabetes mellitus with foot ulcer: Secondary | ICD-10-CM | POA: Diagnosis not present

## 2019-06-19 DIAGNOSIS — F17200 Nicotine dependence, unspecified, uncomplicated: Secondary | ICD-10-CM | POA: Diagnosis not present

## 2019-06-19 DIAGNOSIS — E1122 Type 2 diabetes mellitus with diabetic chronic kidney disease: Secondary | ICD-10-CM | POA: Diagnosis not present

## 2019-06-19 LAB — URINALYSIS, COMPLETE (UACMP) WITH MICROSCOPIC
Bilirubin Urine: NEGATIVE
Glucose, UA: 100 mg/dL — AB
Ketones, ur: NEGATIVE mg/dL
Leukocytes,Ua: NEGATIVE
Nitrite: NEGATIVE
Protein, ur: 100 mg/dL — AB
Specific Gravity, Urine: 1.02 (ref 1.005–1.030)
pH: 6 (ref 5.0–8.0)

## 2019-06-19 MED ORDER — TAMSULOSIN HCL 0.4 MG PO CAPS: 0.4000 mg | ORAL_CAPSULE | Freq: Every day | ORAL | 11 refills | Status: DC

## 2019-06-19 NOTE — Progress Notes (Addendum)
XYLON, CROOM ([id number]) Visit Report for 06/19/2019 Chief Complaint Document Details Patient Name: Scott [name], [name] Date of Service: 06/19/2019 1:30 PM Medical Record Number: [id number] Patient Account Number: [id number] Date of Birth/Sex: [birth year] (58 y.o. M) Treating RN: Harold Barban Primary Care Provider: Norville Haggard Other Clinician: Referring Provider: Norville Haggard Treating Provider/Extender: STONE III, Jguadalupe Opiela Weeks in Treatment: 1 Information Obtained from: Patient Chief Complaint Chronic right forefoot ulcer. Arterial insufficiency. New onset cellulitis and abscess. Electronic Signature(s) Signed: 06/19/2019 1:30:30 PM By: Worthy Keeler PA-C Entered By: Worthy Keeler on 06/19/2019 13:30:30 Scott [name], [name] ([id number]) -------------------------------------------------------------------------------- Debridement Details Patient Name: Scott Mount A. Date of Service: 06/19/2019 1:30 PM Medical Record Number: [id number] Patient Account Number: [id number] Date of Birth/Sex: March 30, 1961 (58 y.o. M) Treating RN: Harold Barban Primary Care Provider: Norville Haggard Other Clinician: Referring Provider: [facility] Treating Provider/Extender: STONE III, Lavan Imes Weeks in Treatment: 1 Debridement Performed for Wound #4 Left Amputation Site - Toe Assessment: Performed By: Physician STONE III, Ander Wamser E., PA-C Debridement Type: Debridement Severity of Tissue Pre Fat layer exposed Debridement: Level of Consciousness (Pre- Awake and Alert procedure): Pre-procedure Verification/Time Yes - 14:22 Out Taken: Start Time: 14:22 Pain Control: Lidocaine Total Area Debrided (L x W): 7.2 (cm) x 3.1 (cm) = 22.32 (cm) Tissue and other material Viable, Non-Viable, Eschar, Slough, Subcutaneous, Slough debrided: Level: Skin/Subcutaneous Tissue Debridement Description: Excisional Instrument: Forceps, Scissors Specimen: Swab, Number of Specimens Taken: 1 Bleeding:  Minimum Hemostasis Achieved: Pressure End Time: 14:29 Procedural Pain: 7 Post Procedural Pain: 7 Response to Treatment: Procedure was tolerated well Level of Consciousness Awake and Alert (Post-procedure): Post Debridement Measurements of Total Wound Length: (cm) 7.2 Width: (cm) 3.1 Depth: (cm) 0.4 Volume: (cm) 7.012 Character of Wound/Ulcer Post Debridement: Improved Severity of Tissue Post Debridement: Fat layer exposed Post Procedure Diagnosis Same as Pre-procedure Electronic Signature(s) Signed: 06/19/2019 4:50:44 PM By: Harold Barban Signed: 06/19/2019 6:08:25 PM By: Worthy Keeler PA-C Entered By: Harold Barban on 06/19/2019 14:30:44 Scott [name], [name] ([id number]) Scott [name], [name] ([id number]) -------------------------------------------------------------------------------- HPI Details Patient Name: Scott [name], [name] Date of Service: 06/19/2019 1:30 PM Medical Record Number: [id number] Patient Account Number: [id number] Date of Birth/Sex: March 22, 1961 (58 y.o. M) Treating RN: Harold Barban Primary Care Provider: Norville Haggard Other Clinician: Referring Provider: [facility] Treating Provider/Extender: STONE III, Dominico Rod Weeks in Treatment: 1 History of Present Illness HPI Description: The patient is a pleasant 58 year old with a past medical history significant for type 2 diabetes (hemoglobin A1c 13.2 in June 2015), peripheral neuropathy, PVD, and smoking. 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 in Nov 2014 as well as RLE angioplasty by [name]. He says that the surgical site never healed. He reports moderate pain at the site of his ulceration with pressure. He also describes symptoms consistent with claudication. No ischemic rest pain. ABI on the right is 0.99. Monophasic DP. Xray 08/01/2014 showed no definite osteomyelitis, MRI 08/17/2014 negative for osteomyelitis. Biopsy cultures  from 08/01/2014 grew Myroides species, sensitive only to imipenen. Completed 2 week course of doxycycline. Sensitivity to doxy requested but not performed. TCOM 71mmHg. Scheduled to see [name] and [name] in f/u. We have discussed hyperbaric oxygen therapy for his nonhealing Wagner grade 4 ulceration as well as arterial insufficiency but wishes to hold off until he obtains Medicaid coverage, which he is pursuing. He is offloading with a Darco shoe. Has declined total contact casting. Last week he  presented with a small area of cellulitis on the dorsum of his right forefoot. Cultures grew methicillin sensitive staph aureus, sensitive to tigecycline. He was started on doxycycline. He returns to clinic for follow-up today and reports worsening redness, swelling, pain, and drainage. He denies any fevers. Readmission: Patient presents today for initial evaluation here in our clinic as a referral from [name] secondary to issues that he has been having with his wound. 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 His left great toe and it appears part of the first metatarsal which was amputated. With that being said the area of amputation actually does show some region of necrotic tissue where the flap does not seem to be taking. There is definite necrotic tissue underneath as well the sutures are still in place. Once the sutures are removed I do believe that a lot of this is getting need to be cleaned away. He sees [name] on Wednesday and at that time will be able to see how things are actually doing once he is able to remove some of the necrotic tissue here. I believe this patient may be a good candidate for a wound VAC based on what I am seeing. With that being said we have discussed the possibility of hyperbaric oxygen therapy. With that being said my concern in that regard is that of his ejection fraction which is somewhat low to consider hyperbarics based on his last test which was  on January 20 of 2020 where he registered at 35 to 40%. With that being said I definitely believe that being that this is greater than 6 months he would definitely need to see cardiology and have his ejection fraction rechecked prior to even consideration of the hyperbarics. The other thing is I am not sure that there is actually anything remaining which is showing signs of infection at this time hence the reason that he may also not be a hyperbaric candidate based on that. Obviously the patient can go into the hyperbaric oxygen chamber for a Waggoner grade 3 ulcer but again at this point I am not sure that it qualifies as such based on what I am seeing. There is no signs of active infection currently. This is also out of the timeframe for a failed graft/flap. Patient does have end-stage renal disease and is currently on peritoneal dialysis. He is also having a lot of pain in his abdomen especially on the right side today I am unsure if this could potentially be a kidney stone or something else. His blood pressure was also very faint upon evaluation he is actually going to the ER upon leaving here today he just wanted to get this appointment and first. His most recent hemoglobin A1c on 05/22/2019 was 6.1. He does see [name] on Wednesday and we have gotten permission through the referral to [place] to see this patient even though they are in the postop global 90-day period. 06/19/2019 on evaluation today patient presents for follow-up concerning his issues with his left amputation site. He did see [name] last week and they remove the sutures from the wound. Unfortunately this has dehisced and he does have necrotic tissue noted in the base of the wound. Fortunately there is no signs of obvious or significant active infection is not on any antibiotics currently though he has been having issues with his kidneys he actually saw the kidney doctor earlier today he may be placed on antibiotics for that  shortly. Nonetheless he  again was sent for consideration of hyperbaric oxygen therapy but again the patient also has congestive heart failure, chronic kidney disease for which he undergoes peritoneal dialysis, and Scott [name], [name] ([id number]) overall he seems to be in somewhat poor health I am a little concerned about the hyperbaric chamber with regard to his safety. We definitely would need to have a recent echocardiogram to evaluate ejection fraction prior to diving at minimum. Nonetheless I do not see obvious signs of infection at this point and again the majority of the necrotic bone was stated to have been removed based on review of the patient's notes. Electronic Signature(s) Signed: 06/19/2019 2:40:56 PM By: Worthy Keeler PA-C Entered By: Worthy Keeler on 06/19/2019 14:40:56 Scott [name], [name] ([id number]) -------------------------------------------------------------------------------- Physical Exam Details Patient Name: Mulvehill, Parnell A. Date of Service: 06/19/2019 1:30 PM Medical Record Number: [id number] Patient Account Number: [id number] Date of Birth/Sex: 02-21-1961 (58 y.o. M) Treating RN: Harold Barban Primary Care Provider: Norville Haggard Other Clinician: Referring Provider: [facility] Treating Provider/Extender: STONE III, Dante Roudebush Weeks in Treatment: 1 Constitutional Well-nourished and well-hydrated in no acute distress. Respiratory normal breathing without difficulty. Psychiatric this patient is able to make decisions and demonstrates good insight into disease process. Alert and Oriented x 3. pleasant and cooperative. Notes Patient's wound VAC currently showed signs of dehiscence with exposure of the bone in the base of the wound. There was necrotic tissue on either side of the incision line which does not appear to be doing well nor is healthy at all. Fortunately there is no signs of active infection systemically other which is good news he has been having some  issues with his kidneys he may have an infection there. No fevers, chills, nausea, vomiting, or diarrhea. I do think the patient's can require some sharp debridement today this was performed without any complication including no significant bleeding I was very careful in that regard considering the patient is on blood thinners. With that being said I was able to remove a significant portion of necrotic tissue although not all and post debridement wound bed appears to be doing a little bit better. There is bone exposed in the base of the wound but it appears to be healthy and that again is good news. That is in the first metatarsal location. Electronic Signature(s) Signed: 06/19/2019 2:41:47 PM By: Worthy Keeler PA-C Entered By: Worthy Keeler on 06/19/2019 14:41:46 Hayashida, Reshawn AMarland Kitchen ([id number]) -------------------------------------------------------------------------------- Physician Orders Details Patient Name: Scott Mount A. Date of Service: 06/19/2019 1:30 PM Medical Record Number: [id number] Patient Account Number: [id number] Date of Birth/Sex: 07-16-1961 (58 y.o. M) Treating RN: Harold Barban Primary Care Provider: Norville Haggard Other Clinician: Referring Provider: [facility] Treating Provider/Extender: STONE III, Ramin Zoll Weeks in Treatment: 1 Verbal / Phone Orders: No Diagnosis Coding ICD-10 Coding Code Description E11.621 Type 2 diabetes mellitus with foot ulcer T81.31XA Disruption of external operation (surgical) wound, not elsewhere classified, initial encounter L97.522 Non-pressure chronic ulcer of other part of left foot with fat layer exposed N18.6 End stage renal disease Z99.2 Dependence on renal dialysis I10 Essential (primary) hypertension Wound Cleansing Wound #4 Left Amputation Site - Toe o Clean wound with Normal Saline. Primary Wound Dressing Wound #4 Left Amputation Site - Toe o Other: - Vashe, apply to gauze, squeeze out excess apply to  wound Secondary Dressing Wound #4 Left Amputation Site - Toe o ABD pad - rolled gauze to secure Dressing Change Frequency Wound #4 Left Amputation Site - Toe o Change dressing  every day. Follow-up Appointments Wound #4 Left Amputation Site - Toe o Return Appointment in 1 week. - Wednesday October 28 with Dr. Dellia Nims Off-Loading o Open toe surgical shoe with peg assist. Electronic Signature(s) Signed: 06/19/2019 4:50:44 PM By: Harold Barban Signed: 06/19/2019 6:08:25 PM By: Worthy Keeler PA-C Entered By: Harold Barban on 06/19/2019 14:36:40 Tolan, Josafat A. ([id number]) -------------------------------------------------------------------------------- Problem List Details Patient Name: Daise, Odus A. Date of Service: 06/19/2019 1:30 PM Medical Record Number: [id number] Patient Account Number: [id number] Date of Birth/Sex: [birth year] (58 y.o. M) Treating RN: Harold Barban Primary Care Provider: Norville Haggard Other Clinician: Referring Provider: Norville Haggard Treating Provider/Extender: Melburn Hake, Otha Rickles Weeks in Treatment: 1 Active Problems ICD-10 Evaluated Encounter Code Description Active Date Today Diagnosis E11.621 Type 2 diabetes mellitus with foot ulcer 06/12/2019 No Yes T81.31XA Disruption of external operation (surgical) wound, not 06/12/2019 No Yes elsewhere classified, initial encounter L97.522 Non-pressure chronic ulcer of other part of left foot with fat 06/12/2019 No Yes layer exposed N18.6 End stage renal disease 06/12/2019 No Yes Z99.2 Dependence on renal dialysis 06/12/2019 No Yes I10 Essential (primary) hypertension 06/12/2019 No Yes Inactive Problems Resolved Problems Electronic Signature(s) Signed: 06/19/2019 1:30:24 PM By: Worthy Keeler PA-C Entered By: Worthy Keeler on 06/19/2019 13:30:24 Newsome, Jamille A. ([id number]) -------------------------------------------------------------------------------- Progress Note/History and Physical  Details Patient Name: Gras, Les A. Date of Service: 06/19/2019 1:30 PM Medical Record Number: [id number] Patient Account Number: [id number] Date of Birth/Sex: 03/21/1961 (58 y.o. M) Treating RN: Harold Barban Primary Care Provider: Norville Haggard Other Clinician: Referring Provider: Norville Haggard Treating Provider/Extender: STONE III, Sonna Lipsky Weeks in Treatment: 1 Subjective Chief Complaint Information obtained from Patient Chronic right forefoot ulcer. Arterial insufficiency. New onset cellulitis and abscess. History of Present Illness (HPI) The patient is a pleasant 58 year old with a past medical history significant for type 2 diabetes (hemoglobin A1c 13.2 in June 2015), peripheral neuropathy, PVD, and smoking. 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 in Nov 2014 as well as RLE angioplasty by [name]. He says that the surgical site never healed. He reports moderate pain at the site of his ulceration with pressure. He also describes symptoms consistent with claudication. No ischemic rest pain. ABI on the right is 0.99. Monophasic DP. Xray 08/01/2014 showed no definite osteomyelitis, MRI 08/17/2014 negative for osteomyelitis. Biopsy cultures from 08/01/2014 grew Myroides species, sensitive only to imipenen. Completed 2 week course of doxycycline. Sensitivity to doxy requested but not performed. TCOM 92mmHg. Scheduled to see [name] and [name] in f/u. We have discussed hyperbaric oxygen therapy for his nonhealing Wagner grade 4 ulceration as well as arterial insufficiency but wishes to hold off until he obtains Medicaid coverage, which he is pursuing. He is offloading with a Darco shoe. Has declined total contact casting. Last week he presented with a small area of cellulitis on the dorsum of his right forefoot. Cultures grew methicillin sensitive staph aureus, sensitive to tigecycline. He was started on  doxycycline. He returns to clinic for follow-up today and reports worsening redness, swelling, pain, and drainage. He denies any fevers. Readmission: Patient presents today for initial evaluation here in our clinic as a referral from [name] secondary to issues that he has been having with his wound. 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 His left great toe and it appears part of the first metatarsal which was amputated. With that being said the area of  amputation actually does show some region of necrotic tissue where the flap does not seem to be taking. There is definite necrotic tissue underneath as well the sutures are still in place. Once the sutures are removed I do believe that a lot of this is getting need to be cleaned away. He sees [name] on Wednesday and at that time will be able to see how things are actually doing once he is able to remove some of the necrotic tissue here. I believe this patient may be a good candidate for a wound VAC based on what I am seeing. With that being said we have discussed the possibility of hyperbaric oxygen therapy. With that being said my concern in that regard is that of his ejection fraction which is somewhat low to consider hyperbarics based on his last test which was on January 20 of 2020 where he registered at 35 to 40%. With that being said I definitely believe that being that this is greater than 6 months he would definitely need to see cardiology and have his ejection fraction rechecked prior to even consideration of the hyperbarics. The other thing is I am not sure that there is actually anything remaining which is showing signs of infection at this time hence the reason that he may also not be a hyperbaric candidate based on that. Obviously the patient can go into the hyperbaric oxygen chamber for a Waggoner grade 3 ulcer but again at this point I am not sure that it qualifies as such based on what I am seeing. There is no signs  of active infection currently. This is also out of the timeframe for a failed graft/flap. Patient does have end-stage renal disease and is currently on peritoneal dialysis. He is also having a lot of pain in his abdomen especially on the right side today I am unsure if this could potentially be a kidney stone or something else. His blood pressure was also very faint upon evaluation he is actually going to the ER upon leaving here today he just wanted to get this appointment and first. His most recent hemoglobin A1c on 05/22/2019 was 6.1. He does see [name] on Wednesday and we have gotten permission through the referral to [place] to see this patient even though they are in the postop global 90-day period. Scott [name], [name] ([id number]) 06/19/2019 on evaluation today patient presents for follow-up concerning his issues with his left amputation site. He did see [name] last week and they remove the sutures from the wound. Unfortunately this has dehisced and he does have necrotic tissue noted in the base of the wound. Fortunately there is no signs of obvious or significant active infection is not on any antibiotics currently though he has been having issues with his kidneys he actually saw the kidney doctor earlier today he may be placed on antibiotics for that shortly. Nonetheless he again was sent for consideration of hyperbaric oxygen therapy but again the patient also has congestive heart failure, chronic kidney disease for which he undergoes peritoneal dialysis, and overall he seems to be in somewhat poor health I am a little concerned about the hyperbaric chamber with regard to his safety. We definitely would need to have a recent echocardiogram to evaluate ejection fraction prior to diving at minimum. Nonetheless I do not see obvious signs of infection at this point and again the majority of the necrotic bone was stated to have been removed based on review of the patient's notes. Patient  History Information obtained from Patient. Family History Cancer - Father, Diabetes - Siblings, No family history of Heart Disease, Hereditary Spherocytosis, Hypertension, Kidney Disease, Lung Disease, Seizures, Stroke, Thyroid Problems, Tuberculosis. Social History Current every day smoker, Marital Status - Single, Alcohol Use - Never, Drug Use - No History, Caffeine Use - Daily - coffee, tea. Medical History Eyes Denies history of Cataracts, Glaucoma, Optic Neuritis Ear/Nose/Mouth/Throat Denies history of Chronic sinus problems/congestion, Middle ear problems Hematologic/Lymphatic Denies history of Anemia, Hemophilia, Human Immunodeficiency Virus, Lymphedema, Sickle Cell Disease Respiratory Denies history of Aspiration, Asthma, Chronic Obstructive Pulmonary Disease (COPD), Pneumothorax, Sleep Apnea, Tuberculosis Cardiovascular Patient has history of Congestive Heart Failure, Coronary Artery Disease, Hypertension, Peripheral Venous Disease Denies history of Angina, Arrhythmia, Deep Vein Thrombosis, Hypotension, Myocardial Infarction, Peripheral Arterial Disease, Phlebitis, Vasculitis Gastrointestinal Denies history of Cirrhosis , Colitis, Crohn s, Hepatitis A, Hepatitis B, Hepatitis C Endocrine Patient has history of Type II Diabetes Denies history of Type I Diabetes Genitourinary Patient has history of End Stage Renal Disease - ESRD on PD Immunological Denies history of Lupus Erythematosus, Raynaud s, Scleroderma Integumentary (Skin) Denies history of History of Burn, History of pressure wounds Musculoskeletal Patient has history of Osteoarthritis Denies history of Gout, Rheumatoid Arthritis, Osteomyelitis Neurologic Patient has history of Neuropathy Denies history of Dementia, Quadriplegia, Paraplegia, Seizure Disorder Oncologic Denies history of Received Chemotherapy, Received Radiation Psychiatric Patient has history of Confinement Anxiety Denies history of  Anorexia/bulimia Scott [name], [name] ([id number]) Patient is treated with Insulin, Oral Agents. Blood sugar is tested. Blood sugar results noted at the following times: Breakfast - 200's. Hospitalization/Surgery History - Infection of toe/Amputation. Medical And Surgical History Notes Cardiovascular HLD Review of Systems (ROS) Constitutional Symptoms (General Health) Denies complaints or symptoms of Fatigue, Fever, Chills, Marked Weight Change. Respiratory Denies complaints or symptoms of Chronic or frequent coughs, Shortness of Breath. Cardiovascular Denies complaints or symptoms of Chest pain, LE edema. Psychiatric Denies complaints or symptoms of Anxiety, Claustrophobia. Objective Constitutional Well-nourished and well-hydrated in no acute distress. Vitals Time Taken: 1:28 PM, Height: 69 in, Weight: 215 lbs, BMI: 31.7, Temperature: 97.9 F, Pulse: 82 bpm, Respiratory Rate: 18 breaths/min, Blood Pressure: 148/40 mmHg. Respiratory normal breathing without difficulty. Psychiatric this patient is able to make decisions and demonstrates good insight into disease process. Alert and Oriented x 3. pleasant and cooperative. General Notes: Patient's wound VAC currently showed signs of dehiscence with exposure of the bone in the base of the wound. There was necrotic tissue on either side of the incision line which does not appear to be doing well nor is healthy at all. Fortunately there is no signs of active infection systemically other which is good news he has been having some issues with his kidneys he may have an infection there. No fevers, chills, nausea, vomiting, or diarrhea. I do think the patient's can require some sharp debridement today this was performed without any complication including no significant bleeding I was very careful in that regard considering the patient is on blood thinners. With that being said I was able to remove a significant portion of necrotic tissue although  not all and post debridement wound bed appears to be doing a little bit better. There is bone exposed in the base of the wound but it appears to be healthy and that again is good news. That is in the first metatarsal location. Integumentary (Hair, Skin) Wound #4 status is Open. Original cause of wound was Surgical Injury. The wound is located on the Left Amputation Site -  Toe. The wound measures 7.2cm length x 3.1cm width x 0.4cm depth; 17.53cm^2 area and 7.012cm^3 volume. There is Fat Layer (Subcutaneous Tissue) Exposed exposed. There is no undermining noted, however, there is tunneling at 12:00 with a Scott [name], [name] ([id number]) maximum distance of 2cm. There is a medium amount of serous drainage noted. The wound margin is flat and intact. There is small (1-33%) granulation within the wound bed. There is a large (67-100%) amount of necrotic tissue within the wound bed including Eschar and Adherent Slough. Assessment Active Problems ICD-10 Type 2 diabetes mellitus with foot ulcer Disruption of external operation (surgical) wound, not elsewhere classified, initial encounter Non-pressure chronic ulcer of other part of left foot with fat layer exposed End stage renal disease Dependence on renal dialysis Essential (primary) hypertension Procedures Wound #4 Pre-procedure diagnosis of Wound #4 is an Open Surgical Wound located on the Left Amputation Site - Toe .Severity of Tissue Pre Debridement is: Fat layer exposed. There was a Excisional Skin/Subcutaneous Tissue Debridement with a total area of 22.32 sq cm performed by STONE III, Shakala Marlatt E., PA-C. With the following instrument(s): Forceps, and Scissors to remove Viable and Non-Viable tissue/material. Material removed includes Eschar, Subcutaneous Tissue, and Slough after achieving pain control using Lidocaine. 1 specimen was taken by a Swab and sent to the lab per facility protocol. A time out was conducted at 14:22, prior to the start of  the procedure. A Minimum amount of bleeding was controlled with Pressure. The procedure was tolerated well with a pain level of 7 throughout and a pain level of 7 following the procedure. Post Debridement Measurements: 7.2cm length x 3.1cm width x 0.4cm depth; 7.012cm^3 volume. Character of Wound/Ulcer Post Debridement is improved. Severity of Tissue Post Debridement is: Fat layer exposed. Post procedure Diagnosis Wound #4: Same as Pre-Procedure Plan Wound Cleansing: Wound #4 Left Amputation Site - Toe: Clean wound with Normal Saline. Primary Wound Dressing: Wound #4 Left Amputation Site - Toe: Other: - Vashe, apply to gauze, squeeze out excess apply to wound Secondary Dressing: Wound #4 Left Amputation Site - Toe: ABD pad - rolled gauze to secure Dressing Change Frequency: Wound #4 Left Amputation Site - Toe: Change dressing every day. Follow-up Appointments: KAMEREN, PARGAS A. ([id number]) Wound #4 Left Amputation Site - Toe: Return Appointment in 1 week. - Wednesday October 28 with Dr. Dellia Nims Off-Loading: Open toe surgical shoe with peg assist. 1. My suggestion currently is going to be that we actually discontinue the alginate for the time being and switch to Vashe soaked gauze dressings. We will use Dakin solution here in the clinic but again we need to loosen up a lot of this necrotic tissue and get that cleared away in order to have any chance at getting this to heal. The patient is in agreement with this plan. Subsequently we are going to also continue debridement on an ongoing basis when we see him here in the clinic obviously the more of the necrotic tissue we get out by way of debridement the last has to be done by way of the utilization of Vioxx. 2. I am also going to suggest that the patient is getting need a wound VAC I believe as soon as we get this clean enough to initiate such. He may also benefit from the possibility of a skin substitute but again first and foremost I  think we need to get the wound clean. 3. I recommend he continue to wear the postop shoe at this point. We will see  patient back for reevaluation in 1 week here in the clinic. If anything worsens or changes patient will contact our office for additional recommendations. I will have him see Dr. Dellia Nims next week since I will be out of town subsequently I will see him the following week and see where things stand I do believe that we can get this improving but again I explained to the patient is going to take some time and he just needs to be patient and bear with the process he seems to be in agreement with doing such. He would like to avoid having to go back for further surgery. Electronic Signature(s) Signed: 06/19/2019 2:43:25 PM By: Worthy Keeler PA-C Entered By: Worthy Keeler on 06/19/2019 14:43:25 Scott [name], [name] ([id number]) -------------------------------------------------------------------------------- ROS/PFSH Details Patient Name: Scott Mount A. Date of Service: 06/19/2019 1:30 PM Medical Record Number: [id number] Patient Account Number: [id number] Date of Birth/Sex: 1961-07-05 (58 y.o. M) Treating RN: Harold Barban Primary Care Provider: Norville Haggard Other Clinician: Referring Provider: Norville Haggard Treating Provider/Extender: STONE III, Nadege Carriger Weeks in Treatment: 1 Label Progress Note Print Version as History and Physical for this encounter Information Obtained From Patient Constitutional Symptoms (General Health) Complaints and Symptoms: Negative for: Fatigue; Fever; Chills; Marked Weight Change Respiratory Complaints and Symptoms: Negative for: Chronic or frequent coughs; Shortness of Breath Medical History: Negative for: Aspiration; Asthma; Chronic Obstructive Pulmonary Disease (COPD); Pneumothorax; Sleep Apnea; Tuberculosis Cardiovascular Complaints and Symptoms: Negative for: Chest pain; LE edema Medical History: Positive for: Congestive Heart Failure;  Coronary Artery Disease; Hypertension; Peripheral Venous Disease Negative for: Angina; Arrhythmia; Deep Vein Thrombosis; Hypotension; Myocardial Infarction; Peripheral Arterial Disease; Phlebitis; Vasculitis Past Medical History Notes: HLD Psychiatric Complaints and Symptoms: Negative for: Anxiety; Claustrophobia Medical History: Positive for: Confinement Anxiety Negative for: Anorexia/bulimia Eyes Medical History: Negative for: Cataracts; Glaucoma; Optic Neuritis Ear/Nose/Mouth/Throat Medical History: Negative for: Chronic sinus problems/congestion; Middle ear problems Hematologic/Lymphatic Scott [name], [name] ([id number]) Medical History: Negative for: Anemia; Hemophilia; Human Immunodeficiency Virus; Lymphedema; Sickle Cell Disease Gastrointestinal Medical History: Negative for: Cirrhosis ; Colitis; Crohnos; Hepatitis A; Hepatitis B; Hepatitis C Endocrine Medical History: Positive for: Type II Diabetes Negative for: Type I Diabetes Time with diabetes: 24yrs Treated with: Insulin, Oral agents Blood sugar tested every day: Yes Tested : twice daily Blood sugar testing results: Breakfast: 200's Genitourinary Medical History: Positive for: End Stage Renal Disease - ESRD on PD Immunological Medical History: Negative for: Lupus Erythematosus; Raynaudos; Scleroderma Integumentary (Skin) Medical History: Negative for: History of Burn; History of pressure wounds Musculoskeletal Medical History: Positive for: Osteoarthritis Negative for: Gout; Rheumatoid Arthritis; Osteomyelitis Neurologic Medical History: Positive for: Neuropathy Negative for: Dementia; Quadriplegia; Paraplegia; Seizure Disorder Oncologic Medical History: Negative for: Received Chemotherapy; Received Radiation Immunizations Pneumococcal Vaccine: Received Pneumococcal Vaccination: Yes Tetanus Vaccine: Last tetanus shot: 07/03/2013 Implantable Devices Scott [name], Scott [name] A.  ([id number]) None Hospitalization / Surgery History Type of Hospitalization/Surgery Infection of toe/Amputation Family and Social History Cancer: Yes - Father; Diabetes: Yes - Siblings; Heart Disease: No; Hereditary Spherocytosis: No; Hypertension: No; Kidney Disease: No; Lung Disease: No; Seizures: No; Stroke: No; Thyroid Problems: No; Tuberculosis: No; Current every day smoker; Marital Status - Single; Alcohol Use: Never; Drug Use: No History; Caffeine Use: Daily - coffee, tea; Financial Concerns: No; Food, Clothing or Shelter Needs: No; Support System Lacking: No; Transportation Concerns: No Physician Affirmation I have reviewed and agree with the above information. Electronic Signature(s) Signed: 06/19/2019 4:50:44 PM By: Harold Barban Signed: 06/19/2019 6:08:25 PM By: Worthy Keeler PA-C Entered By: Joaquim Lai  III, Dorsel Flinn on 06/19/2019 14:41:13 Soltys, Dael A. ([id number]) -------------------------------------------------------------------------------- SuperBill Details Patient Name: Frysinger, Daeveon A. Date of Service: 06/19/2019 Medical Record Number: [id number] Patient Account Number: [id number] Date of Birth/Sex: [birth year] (58 y.o. M) Treating RN: Harold Barban Primary Care Provider: Norville Haggard Other Clinician: Referring Provider: [facility] Treating Provider/Extender: STONE III, Amy Gothard Weeks in Treatment: 1 Diagnosis Coding ICD-10 Codes Code Description E11.621 Type 2 diabetes mellitus with foot ulcer T81.31XA Disruption of external operation (surgical) wound, not elsewhere classified, initial encounter L97.522 Non-pressure chronic ulcer of other part of left foot with fat layer exposed N18.6 End stage renal disease Z99.2 Dependence on renal dialysis I10 Essential (primary) hypertension Facility Procedures CPT4: Description Modifier Quantity Code [id number] 99213 - WOUND CARE VISIT-LEV 3 EST PT 1 CPT4: 38177116 11042 - DEB SUBQ TISSUE 20 SQ CM/< 1 ICD-10 Diagnosis  Description L97.522 Non-pressure chronic ulcer of other part of left foot with fat layer exposed T81.31XA Disruption of external operation (surgical) wound, not elsewhere classified,  initial encounter CPT4: 57903833 11045 - DEB SUBQ TISS EA ADDL 20CM 1 ICD-10 Diagnosis Description L97.522 Non-pressure chronic ulcer of other part of left foot with fat layer exposed T81.31XA Disruption of external operation (surgical) wound, not elsewhere classified,  initial encounter Physician Procedures CPT4: Description Modifier Quantity Code [id number] 16606 - WC PHYS SUBQ TISS 20 SQ CM 1 ICD-10 Diagnosis Description L97.522 Non-pressure chronic ulcer of other part of left foot with fat layer exposed T81.31XA Disruption of external operation (surgical)  wound, not elsewhere classified, initial encounter CPT4: 0045997 11045 - WC PHYS SUBQ TISS EA ADDL 20 CM 1 ICD-10 Diagnosis Description L97.522 Non-pressure chronic ulcer of other part of left foot with fat layer exposed T81.31XA Messamore, Darril A. ([id number]) Electronic Signature(s) Signed: 06/19/2019 2:43:52 PM By: Worthy Keeler PA-C Entered By: Worthy Keeler on 06/19/2019 14:43:51

## 2019-06-19 NOTE — Progress Notes (Signed)
NUSSEN, PULLIN ([id number]) Visit Report for 06/19/2019 Arrival Information Details Patient Name: Scott [name], [name] Date of Service: 06/19/2019 1:30 PM Medical Record Number: [id number] Patient Account Number: [id number] Date of Birth/Sex: 03-Sep-1960 (58 y.o. M) Treating RN: Harold Barban Primary Care Khylee Algeo: Norville Haggard Other Clinician: Referring Manaia Samad: Norville Haggard Treating Daray Polgar/Extender: STONE III, HOYT Weeks in Treatment: 1 Visit Information History Since Last Visit Added or deleted any medications: No Patient Arrived: Ambulatory Has Dressing in Place as Prescribed: Yes Arrival Time: 13:28 Pain Present Now: No Accompanied By: roommate Transfer Assistance: None Patient Identification Verified: Yes Secondary Verification Process Yes Completed: Patient Has Alerts: Yes Patient Alerts: Patient on Blood Thinner Effient aspirin 81 DMII Electronic Signature(s) Signed: 06/19/2019 2:02:43 PM By: Lorine Bears RCP, RRT, CHT Entered By: Lorine Bears on 06/19/2019 13:29:34 [name], [name] ([id number]) -------------------------------------------------------------------------------- Clinic Level of Care Assessment Details Patient Name: [name], [name] Date of Service: 06/19/2019 1:30 PM Medical Record Number: [id number] Patient Account Number: [id number] Date of Birth/Sex: 1960-11-22 (58 y.o. M) Treating RN: Harold Barban Primary Care Nixon Kolton: Norville Haggard Other Clinician: Referring Hatley Henegar: [facility] Treating Abriella Filkins/Extender: STONE III, HOYT Weeks in Treatment: 1 Clinic Level of Care Assessment Items TOOL 4 Quantity Score []  - Use when only an EandM is performed on FOLLOW-UP visit 0 ASSESSMENTS - Nursing Assessment / Reassessment X - Reassessment of Co-morbidities (includes updates in patient status) 1 10 X- 1 5 Reassessment of Adherence to Treatment Plan ASSESSMENTS - Wound and Skin Assessment / Reassessment X -  Simple Wound Assessment / Reassessment - one wound 1 5 []  - 0 Complex Wound Assessment / Reassessment - multiple wounds []  - 0 Dermatologic / Skin Assessment (not related to wound area) ASSESSMENTS - Focused Assessment []  - Circumferential Edema Measurements - multi extremities 0 []  - 0 Nutritional Assessment / Counseling / Intervention []  - 0 Lower Extremity Assessment (monofilament, tuning fork, pulses) []  - 0 Peripheral Arterial Disease Assessment (using hand held doppler) ASSESSMENTS - Ostomy and/or Continence Assessment and Care []  - Incontinence Assessment and Management 0 []  - 0 Ostomy Care Assessment and Management (repouching, etc.) PROCESS - Coordination of Care X - Simple Patient / Family Education for ongoing care 1 15 []  - 0 Complex (extensive) Patient / Family Education for ongoing care []  - 0 Staff obtains Programmer, systems, Records, Test Results / Process Orders X- 1 10 Staff telephones HHA, Nursing Homes / Clarify orders / etc []  - 0 Routine Transfer to another Facility (non-emergent condition) []  - 0 Routine Hospital Admission (non-emergent condition) []  - 0 New Admissions / Biomedical engineer / Ordering NPWT, Apligraf, etc. []  - 0 Emergency Hospital Admission (emergent condition) X- 1 10 Simple Discharge Coordination [name], [name] ([id number]) []  - 0 Complex (extensive) Discharge Coordination PROCESS - Special Needs []  - Pediatric / Minor Patient Management 0 []  - 0 Isolation Patient Management []  - 0 Hearing / Language / Visual special needs []  - 0 Assessment of Community assistance (transportation, D/C planning, etc.) []  - 0 Additional assistance / Altered mentation []  - 0 Support Surface(s) Assessment (bed, cushion, seat, etc.) INTERVENTIONS - Wound Cleansing / Measurement X - Simple Wound Cleansing - one wound 1 5 []  - 0 Complex Wound Cleansing - multiple wounds X- 1 5 Wound Imaging (photographs - any number of wounds) []  - 0 Wound  Tracing (instead of photographs) X- 1 5 Simple Wound Measurement - one wound []  - 0 Complex Wound Measurement - multiple wounds INTERVENTIONS - Wound Dressings X - Small  Wound Dressing one or multiple wounds 1 10 []  - 0 Medium Wound Dressing one or multiple wounds []  - 0 Large Wound Dressing one or multiple wounds []  - 0 Application of Medications - topical []  - 0 Application of Medications - injection INTERVENTIONS - Miscellaneous []  - External ear exam 0 []  - 0 Specimen Collection (cultures, biopsies, blood, body fluids, etc.) []  - 0 Specimen(s) / Culture(s) sent or taken to Lab for analysis []  - 0 Patient Transfer (multiple staff / Civil Service fast streamer / Similar devices) []  - 0 Simple Staple / Suture removal (25 or less) []  - 0 Complex Staple / Suture removal (26 or more) []  - 0 Hypo / Hyperglycemic Management (close monitor of Blood Glucose) []  - 0 Ankle / Brachial Index (ABI) - do not check if billed separately X- 1 5 Vital Signs [name], [name] ([id number]) Has the patient been seen at the hospital within the last three years: Yes Total Score: 85 Level Of Care: New/Established - Level 3 Electronic Signature(s) Signed: 06/19/2019 4:50:44 PM By: Harold Barban Entered By: Harold Barban on 06/19/2019 14:31:47 Soltys, [name] ([id number]) -------------------------------------------------------------------------------- Encounter Discharge Information Details Patient Name: Scott [name], [name] Date of Service: 06/19/2019 1:30 PM Medical Record Number: [id number] Patient Account Number: [id number] Date of Birth/Sex: 26-Mar-1961 (58 y.o. M) Treating RN: Harold Barban Primary Care Herlinda Heady: Norville Haggard Other Clinician: Referring Kallin Henk: [facility] Treating Odessa Morren/Extender: STONE III, HOYT Weeks in Treatment: 1 Encounter Discharge Information Items Post Procedure Vitals Discharge Condition: Stable Temperature (F): 97.9 Ambulatory Status: Ambulatory Pulse (bpm):  82 Discharge Destination: Home Respiratory Rate (breaths/min): 16 Transportation: Private Auto Blood Pressure (mmHg): 148/40 Accompanied By: wife Schedule Follow-up Appointment: Yes Clinical Summary of Care: Electronic Signature(s) Signed: 06/19/2019 4:50:44 PM By: Harold Barban Entered By: Harold Barban on 06/19/2019 14:37:57 Bashore, Lamberto A. ([id number]) -------------------------------------------------------------------------------- Lower Extremity Assessment Details Patient Name: [name], [name] Date of Service: 06/19/2019 1:30 PM Medical Record Number: [id number] Patient Account Number: [id number] Date of Birth/Sex: 12-03-1960 (58 y.o. M) Treating RN: Army Melia Primary Care Nikitia Asbill: Norville Haggard Other Clinician: Referring Donnamarie Shankles: [facility] Treating Lupita Rosales/Extender: STONE III, HOYT Weeks in Treatment: 1 Edema Assessment Assessed: [Left: No] [Right: No] Edema: [Left: N] [Right: o] Vascular Assessment Pulses: Dorsalis Pedis Palpable: [Left:Yes] Electronic Signature(s) Signed: 06/19/2019 4:42:10 PM By: Army Melia Entered By: Army Melia on 06/19/2019 13:37:05 [name], [name] ([id number]) -------------------------------------------------------------------------------- Multi Wound Chart Details Patient Name: [name], [name] Date of Service: 06/19/2019 1:30 PM Medical Record Number: [id number] Patient Account Number: [id number] Date of Birth/Sex: Jul 25, 1961 (58 y.o. M) Treating RN: Harold Barban Primary Care Angelie Kram: Norville Haggard Other Clinician: Referring Doni Widmer: [facility] Treating Elena Cothern/Extender: STONE III, HOYT Weeks in Treatment: 1 Vital Signs Height(in): 69 Pulse(bpm): 82 Weight(lbs): 215 Blood Pressure(mmHg): 148/40 Body Mass Index(BMI): 32 Temperature(F): 97.9 Respiratory Rate 18 (breaths/min): Photos: [N/A:N/A] Wound Location: Left Amputation Site - Toe N/A N/A Wounding Event: Surgical Injury N/A N/A Primary  Etiology: Open Surgical Wound N/A N/A Secondary Etiology: Diabetic Wound/Ulcer of the N/A N/A Lower Extremity Comorbid History: Congestive Heart Failure, N/A N/A Coronary Artery Disease, Hypertension, Peripheral Venous Disease, Type II Diabetes, End Stage Renal Disease, Osteoarthritis, Neuropathy, Confinement Anxiety Date Acquired: 05/24/2019 N/A N/A Weeks of Treatment: 1 N/A N/A Wound Status: Open N/A N/A Pending Amputation on Yes N/A N/A Presentation: Measurements L x W x D 7.2x3.1x0.4 N/A N/A (cm) Area (cm) : 17.53 N/A N/A Volume (cm) : 7.012 N/A N/A % Reduction in Area: -6.30% N/A N/A % Reduction in Volume: -325.20% N/A  N/A Position 1 (o'clock): 12 Maximum Distance 1 (cm): 2 Tunneling: Yes N/A N/A Classification: Partial Thickness N/A N/A Exudate Amount: Medium N/A N/A [name], [name] ([id number]) Exudate Type: Serous N/A N/A Exudate Color: amber N/A N/A Wound Margin: Flat and Intact N/A N/A Granulation Amount: Small (1-33%) N/A N/A Necrotic Amount: Large (67-100%) N/A N/A Necrotic Tissue: Eschar, Adherent Slough N/A N/A Exposed Structures: Fat Layer (Subcutaneous N/A N/A Tissue) Exposed: Yes Fascia: No Tendon: No Muscle: No Joint: No Bone: No Epithelialization: None N/A N/A Treatment Notes Electronic Signature(s) Signed: 06/19/2019 4:50:44 PM By: Harold Barban Entered By: Harold Barban on 06/19/2019 14:07:33 [name], [name] ([id number]) -------------------------------------------------------------------------------- [place] Details Patient Name: Scott Mount A. Date of Service: 06/19/2019 1:30 PM Medical Record Number: [id number] Patient Account Number: [id number] Date of Birth/Sex: [birth year] (58 y.o. M) Treating RN: Harold Barban Primary Care Juwan Vences: Norville Haggard Other Clinician: Referring Kermit Arnette: [facility] Treating Koua Deeg/Extender: STONE III, HOYT Weeks in Treatment: 1 Active Inactive Necrotic  Tissue Nursing Diagnoses: Impaired tissue integrity related to necrotic/devitalized tissue Goals: Necrotic/devitalized tissue will be minimized in the wound bed Date Initiated: 06/12/2019 Target Resolution Date: 07/13/2019 Goal Status: Active Patient/caregiver will verbalize understanding of reason and process for debridement of necrotic tissue Date Initiated: 06/12/2019 Target Resolution Date: 06/12/2019 Goal Status: Active Interventions: Assess patient pain level pre-, during and post procedure and prior to discharge Provide education on necrotic tissue and debridement process Treatment Activities: Apply topical anesthetic as ordered : 06/12/2019 Notes: Wound/Skin Impairment Nursing Diagnoses: Impaired tissue integrity Knowledge deficit related to ulceration/compromised skin integrity Goals: Ulcer/skin breakdown will have a volume reduction of 30% by week 4 Date Initiated: 06/12/2019 Target Resolution Date: 07/13/2019 Goal Status: Active Interventions: Assess patient/caregiver ability to obtain necessary supplies Assess patient/caregiver ability to perform ulcer/skin care regimen upon admission and as needed Assess ulceration(s) every visit Notes: Electronic Signature(s) Khalsa, Ella A. ([id number]) Signed: 06/19/2019 4:50:44 PM By: Harold Barban Entered By: Harold Barban on 06/19/2019 14:07:19 Barse, [name] ([id number]) -------------------------------------------------------------------------------- Pain Assessment Details Patient Name: Bennion, Theus A. Date of Service: 06/19/2019 1:30 PM Medical Record Number: [id number] Patient Account Number: [id number] Date of Birth/Sex: 27-Oct-1960 (58 y.o. M) Treating RN: Harold Barban Primary Care Yeng Frankie: Norville Haggard Other Clinician: Referring Booker Bhatnagar: Norville Haggard Treating Giana Castner/Extender: STONE III, HOYT Weeks in Treatment: 1 Active Problems Location of Pain Severity and Description of Pain Patient Has  Paino No Site Locations Pain Management and Medication Current Pain Management: Electronic Signature(s) Signed: 06/19/2019 2:02:43 PM By: Lorine Bears RCP, RRT, CHT Signed: 06/19/2019 4:50:44 PM By: Harold Barban Entered By: Lorine Bears on 06/19/2019 13:29:43 Hainline, Rashard A. ([id number]) -------------------------------------------------------------------------------- Patient/Caregiver Education Details Patient Name: Scott Mount A. Date of Service: 06/19/2019 1:30 PM Medical Record Number: [id number] Patient Account Number: [id number] Date of Birth/Gender: 1961-03-19 (58 y.o. M) Treating RN: Harold Barban Primary Care Physician: Norville Haggard Other Clinician: Referring Physician: Norville Haggard Treating Physician/Extender: Melburn Hake, HOYT Weeks in Treatment: 1 Education Assessment Education Provided To: Patient Education Topics Provided Wound/Skin Impairment: Handouts: Caring for Your Ulcer Methods: Demonstration, Explain/Verbal Responses: State content correctly Electronic Signature(s) Signed: 06/19/2019 4:50:44 PM By: Harold Barban Entered By: Harold Barban on 06/19/2019 14:08:16 Harpster, Sherwood. ([id number]) -------------------------------------------------------------------------------- Wound Assessment Details Patient Name: Twombly, Adryen A. Date of Service: 06/19/2019 1:30 PM Medical Record Number: [id number] Patient Account Number: [id number] Date of Birth/Sex: 12-28-1960 (58 y.o. 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 Wounding Event: Surgical Injury Etiology: Date Acquired: 05/24/2019 Wound Open Weeks Of Treatment:  1 Status: Clustered Wound: No Comorbid Congestive Heart Failure, Coronary Artery Pending Amputation On Presentation History: Disease, Hypertension, Peripheral Venous Disease, Type II Diabetes, End Stage Renal Disease, Osteoarthritis, Neuropathy, Confinement Anxiety Photos Wound Measurements Length: (cm) 7.2 % Reduction Width: (cm) 3.1 % Reduction Depth: (cm) 0.4 Epitheliali Area: (cm) 17.53 Tunneling: Volume: (cm) 7.012 Positio Maximum in Area: -6.3% in Volume: -325.2% zation: None Yes n (o'clock): 12 Distance: (cm) 2 Undermining: No Wound Description Classification: Partial Thickness Foul Odor A Wound Margin: Flat and Intact Slough/Fibr Exudate Amount: Medium Exudate Type: Serous Exudate Color: amber fter Cleansing: No ino Yes Wound Bed Granulation Amount: Small (1-33%) Exposed Structure Necrotic Amount: Large (67-100%) Fascia Exposed: No Necrotic Quality: Eschar, Adherent Slough Fat Layer (Subcutaneous Tissue) Exposed: Yes Langenderfer, Nghia A. ([id number]) Tendon Exposed: No Muscle Exposed: No Joint Exposed: No Bone Exposed: No Treatment Notes Wound #4 (Left Amputation Site - Toe) Notes Vashe, dry gauze, ABD, Conform Electronic Signature(s) Signed: 06/19/2019 4:42:10 PM By: Army Melia Entered By: Army Melia on 06/19/2019 13:36:45 Freund, Gleen A. ([id number]) -------------------------------------------------------------------------------- Vitals Details Patient Name: Scott [name], Shandell A. Date of Service: 06/19/2019 1:30 PM Medical Record Number: [id number] Patient Account Number: [id number] Date of Birth/Sex: [birth year] (58 y.o. M) Treating RN: Harold Barban Primary Care Fisher Hargadon: Norville Haggard Other Clinician: Referring Ilia Engelbert: [facility] Treating Jhene Westmoreland/Extender: STONE III, HOYT Weeks in Treatment: 1 Vital Signs Time Taken: 13:28 Temperature (F): 97.9 Height (in): 69 Pulse (bpm): 82 Weight (lbs): 215 Respiratory Rate (breaths/min):  18 Body Mass Index (BMI): 31.7 Blood Pressure (mmHg): 148/40 Reference Range: 80 - 120 mg / dl Electronic Signature(s) Signed: 06/19/2019 2:02:43 PM By: Lorine Bears RCP, RRT, CHT Entered By: Lorine Bears on 06/19/2019 13:32:26

## 2019-06-19 NOTE — Progress Notes (Signed)
06/19/2019 4:01 PM   Scott [name] 17-Aug-1961 [id number]  Referring provider: Inc, [address] [place],  [place] [address]  Chief Complaint  Patient presents with   Hydronephrosis    New Patient    HPI: 58 year old male with end-stage renal disease on peritoneal dialysis who presents here for further evaluation of right flank pain and hydronephrosis.  He reports that he had an episode of sudden acute onset right flank pain on the day preceding his emergency room visit.  He had no associated fevers or chills.  He did have some urinary hesitancy and sensation like he was not emptying his bladder completely.  He continues to have this today as well as some dull right flank pain which is improving.  CT scan in the emergency room, noncontrast showed dilation of the right collecting system without any clear underlying etiology for his hydroureteronephrosis.  He has an extensive smoking history, smoked a pack a day until about 3 or 4 years ago.  He denies any gross hematuria.  He does have microscopic blood only in his urine today.  He does void 4-5 times daily despite being on peritoneal dialysis.  This is generally without difficulty but since his initial presentation, has been having increasing difficulty with urinary hesitancy and somewhat weaker stream.  No dysuria.    Urinalysis from the emergency room did indicate some white blood cells but he reports that he did not retract his foreskin fully for this sample, no urine culture was sent he was not treated for an infection.  He is also seeing vascular surgery for a recent toe amputation, vascular surgery for peripheral artery disease, and his nephrologist for possible peritoneal infection.  No history of kidney stones.  Has not seen a stone pass.  PMH: Past Medical History:  Diagnosis Date   Arthritis    "left arm; right leg" (12/13/2014)   Asthma    CHF (congestive heart failure) (HCC)     Chronic disease anemia    /notes 12/13/2014   Chronic kidney disease (CKD), stage IV (severe) ([place])    [name] 12/13/2014... on dialysis   Complication of anesthesia    unable to urinate after CAPD urgery   Continuous ambulatory peritoneal dialysis status [facility])    Coronary artery disease    /notes 12/13/2014   Depression    Dysrhythmia    patient unaware of irregular heartbeat   GERD (gastroesophageal reflux disease)    High cholesterol    [name] 12/13/2014   Hypertension    Non-Q wave myocardial infarction [facility])    [name] 12/13/2014   PVD (peripheral vascular disease) ([place])    [name] 12/13/2014   Type II diabetes mellitus ([place])     Surgical History: Past Surgical History:  Procedure Laterality Date   AMPUTATION TOE Left 05/24/2019   Procedure: Left great toe amputation;  Surgeon: Caroline More, DPM;  Location: [facility] ORS;  Service: Podiatry;  Laterality: Left;   AV FISTULA PLACEMENT Right 04/07/2017   Procedure: ARTERIOVENOUS (AV) FISTULA CREATION ( BRACHIALCEPHALIC );  Surgeon: Katha Cabal, MD;  Location: [facility] ORS;  Service: Vascular;  Laterality: Right;   CAPD INSERTION N/A 04/07/2017   Procedure: LAPAROSCOPIC INSERTION CONTINUOUS AMBULATORY PERITONEAL DIALYSIS  (CAPD) CATHETER;  Surgeon: Katha Cabal, MD;  Location: [facility] ORS;  Service: Vascular;  Laterality: N/A;   CARDIAC CATHETERIZATION  11/2014   COLONOSCOPY WITH PROPOFOL N/A 12/01/2017   Procedure: COLONOSCOPY WITH PROPOFOL;  Surgeon: Lin Landsman, MD;  Location:  [place];  Service: Endoscopy;  Laterality: N/A;  Diabetic - insulin   COLONOSCOPY WITH PROPOFOL N/A 12/29/2017   Procedure: COLONOSCOPY WITH PROPOFOL;  Surgeon: Lin Landsman, MD;  Location: [place];  Service: Endoscopy;  Laterality: N/A;   DIALYSIS/PERMA CATHETER INSERTION N/A 01/18/2017   Procedure: Dialysis/Perma Catheter Insertion;  Surgeon: Algernon Huxley, MD;  Location: [place] CV LAB;  Service:  Cardiovascular;  Laterality: N/A;   DIALYSIS/PERMA CATHETER REMOVAL N/A 07/26/2017   Procedure: DIALYSIS/PERMA CATHETER REMOVAL;  Surgeon: Algernon Huxley, MD;  Location: [place] CV LAB;  Service: Cardiovascular;  Laterality: N/A;   INCISION AND DRAINAGE OF WOUND Right ~ 10/2014   "4th toe foot"   LOWER EXTREMITY ANGIOGRAPHY Left 05/23/2019   Procedure: Lower Extremity Angiography;  Surgeon: Katha Cabal, MD;  Location: [place] CV LAB;  Service: Cardiovascular;  Laterality: Left;   PERCUTANEOUS CORONARY STENT INTERVENTION (PCI-S) N/A 12/14/2014   Procedure: PERCUTANEOUS CORONARY STENT INTERVENTION (PCI-S);  Surgeon: Charolette Forward, MD;  Location: [facility] CATH LAB;  Service: Cardiovascular;  Laterality: N/A;   PERCUTANEOUS CORONARY STENT INTERVENTION (PCI-S) N/A 12/18/2014   Procedure: PERCUTANEOUS CORONARY STENT INTERVENTION (PCI-S);  Surgeon: Charolette Forward, MD;  Location: [facility] CATH LAB;  Service: Cardiovascular;  Laterality: N/A;   POLYPECTOMY  12/01/2017   Procedure: POLYPECTOMY;  Surgeon: Lin Landsman, MD;  Location: [place];  Service: Endoscopy;;   POLYPECTOMY  12/29/2017   Procedure: POLYPECTOMY;  Surgeon: Lin Landsman, MD;  Location: [place];  Service: Endoscopy;;   TOE AMPUTATION Right 2015   4th toe    Home Medications:  Allergies as of 06/19/2019   No Known Allergies     Medication List       Accurate as of June 19, 2019  4:01 PM. If you have any questions, ask your nurse or doctor.        albuterol 108 (90 Base) MCG/ACT inhaler Commonly known as: VENTOLIN HFA Inhale 2 puffs into the lungs every 6 (six) hours as needed for wheezing or shortness of breath.   amLODipine 10 MG tablet Commonly known as: NORVASC Take 10 mg by mouth daily.   amoxicillin-clavulanate 500-125 MG tablet Commonly known as: AUGMENTIN TAKE 1 TABLET BY MOUTH TWICE A DAY FOR 7 DAYS   aspirin EC 81 MG tablet Take 1 tablet (81 mg total) by mouth  daily.   atorvastatin 40 MG tablet Commonly known as: LIPITOR Take 40 mg by mouth daily.   calcitRIOL 0.25 MCG capsule Commonly known as: ROCALTROL Take 0.25 mcg by mouth daily.   calcium acetate 667 MG capsule Commonly known as: PHOSLO Take 2,001 mg by mouth 3 (three) times daily with meals. Takes 1-2 caps if he has snacks   carvedilol 25 MG tablet Commonly known as: COREG Take 1 tablet (25 mg total) by mouth 2 (two) times daily with a meal.   clopidogrel 75 MG tablet Commonly known as: PLAVIX Take 1 tablet (75 mg total) by mouth daily with breakfast.   Dialyvite 800 0.8 MG Tabs Take 1 tablet by mouth daily.   furosemide 80 MG tablet Commonly known as: LASIX Take 80 mg by mouth daily.   gentamicin cream 0.1 % Commonly known as: GARAMYCIN Apply 1 application topically daily.   hydrALAZINE 25 MG tablet Commonly known as: APRESOLINE Take 1 tablet (25 mg total) by mouth 3 (three) times daily. What changed: additional instructions   HYDROcodone-acetaminophen 5-325 MG tablet Commonly known as: NORCO/VICODIN Take 1 tablet  by mouth every 4 (four) hours as needed.   insulin aspart protamine- aspart (70-30) 100 UNIT/ML injection Commonly known as: NOVOLOG MIX 70/30 Inject 0.25 mLs (25 Units total) into the skin 2 (two) times daily with a meal. What changed: how much to take   nitroGLYCERIN 0.4 MG SL tablet Commonly known as: NITROSTAT Place 0.4 mg under the tongue every 5 (five) minutes as needed for chest pain. Pt needs new Rx. Bottle has expried   omeprazole 20 MG capsule Commonly known as: PRILOSEC Take 1 capsule (20 mg total) by mouth daily.   ONE TOUCH ULTRA TEST test strip Generic drug: glucose blood   tamsulosin 0.4 MG Caps capsule Commonly known as: Flomax Take 1 capsule (0.4 mg total) by mouth daily. Started by: Hollice Espy, MD   Vitamin D 50 MCG (2000 UT) tablet Take 2,000 Units by mouth daily. Unsure how many units       Allergies: No Known  Allergies  Family History: Family History  Problem Relation Age of Onset   Cancer Mother        Lung   Cancer Father        Lung   Diabetes Sister    Diabetes Brother    CAD Brother    Hernia Son    Cancer Brother     Social History:  reports that he quit smoking about 4 years ago. His smoking use included cigarettes. He started smoking about 34 years ago. He has a 30.00 pack-year smoking history. He has never used smokeless tobacco. He reports current alcohol use. He reports that he does not use drugs.  ROS: UROLOGY Frequent Urination?: No Hard to postpone urination?: No Burning/pain with urination?: No Get up at night to urinate?: No Leakage of urine?: No Urine stream starts and stops?: No Trouble starting stream?: No Do you have to strain to urinate?: No Blood in urine?: No Urinary tract infection?: No Sexually transmitted disease?: No Injury to kidneys or bladder?: No Painful intercourse?: No Weak stream?: No Erection problems?: No Penile pain?: No  Gastrointestinal Nausea?: No Vomiting?: No Indigestion/heartburn?: No Diarrhea?: No Constipation?: No  Constitutional Fever: No Night sweats?: No Weight loss?: No Fatigue?: No  Skin Skin rash/lesions?: No Itching?: No  Eyes Blurred vision?: No Double vision?: No  Ears/Nose/Throat Sore throat?: No Sinus problems?: No  Hematologic/Lymphatic Swollen glands?: No Easy bruising?: No  Cardiovascular Leg swelling?: No Chest pain?: No  Respiratory Cough?: No Shortness of breath?: No  Endocrine Excessive thirst?: No  Musculoskeletal Back pain?: No Joint pain?: No  Neurological Headaches?: No Dizziness?: No  Psychologic Depression?: No Anxiety?: No  Physical Exam: BP 128/65    Pulse 82    Ht 5\' 9"  (1.753 m)    Wt 187 lb (84.8 kg)    BMI 27.62 kg/m   Constitutional:  Alert and oriented, No acute distress.  Accompanied by wife today. HEENT: [place] AT, moist mucus membranes.  Trachea  midline, no masses. Cardiovascular: No clubbing, cyanosis, or edema. Respiratory: Normal respiratory effort, no increased work of breathing. GI: Abdomen is soft, nontender, nondistended, no abdominal masses, PD catheter in place GU:  LEFT flank pain on exam today, not right Ext: Wearing the [place] boot. Skin: No rashes, bruises or suspicious lesions.  Anthracotic pigment deposition on face. Neurologic: Grossly intact, no focal deficits, moving all 4 extremities. Psychiatric: Normal mood and affect.  Laboratory Data: Lab Results  Component Value Date   WBC 9.9 06/12/2019   HGB 9.1 (L) 06/12/2019  HCT 28.4 (L) 06/12/2019   MCV 97.3 06/12/2019   PLT 283 06/12/2019    Lab Results  Component Value Date   CREATININE 10.18 (H) 06/12/2019    Lab Results  Component Value Date   HGBA1C 6.1 (H) 05/22/2019    Urinalysis Component     Latest Ref Rng & Units 06/19/2019  Color, Urine     YELLOW YELLOW  Appearance     CLEAR CLEAR  Specific Gravity, Urine     1.005 - 1.030 1.020  pH     5.0 - 8.0 6.0  Glucose, UA     NEGATIVE mg/dL 100 (A)  Hgb urine dipstick     NEGATIVE MODERATE (A)  Bilirubin Urine     NEGATIVE NEGATIVE  Ketones, ur     NEGATIVE mg/dL NEGATIVE  Protein     NEGATIVE mg/dL 100 (A)  Nitrite     NEGATIVE NEGATIVE  Leukocytes,Ua     NEGATIVE NEGATIVE  RBC / HPF     0 - 5 RBC/hpf 21-50  WBC, UA     0 - 5 WBC/hpf 0-5  Bacteria, UA     NONE SEEN RARE (A)  Squamous Epithelial / LPF     0 - 5 0-5    Pertinent Imaging: Results for orders placed during the hospital encounter of 06/12/19  CT Renal Stone Study   Narrative CLINICAL DATA:  Right flank pain.  On dialysis.  EXAM: CT ABDOMEN AND PELVIS WITHOUT CONTRAST  TECHNIQUE: Multidetector CT imaging of the abdomen and pelvis was performed following the standard protocol without IV contrast.  COMPARISON:  07/11/2017  FINDINGS: Lower chest: Cardiomegaly. Coronary artery calcifications. Visualized  lung bases clear.  Hepatobiliary: Gallbladder is mildly dilated with a small amount biliary sludge and small stones. Liver is unremarkable without discrete lesion.  Pancreas: Unremarkable. No pancreatic ductal dilatation or surrounding inflammatory changes.  Spleen: Normal in size without focal abnormality.  Adrenals/Urinary Tract: Adrenal glands unremarkable. Mild-to-moderate right-sided hydronephrosis. There is mild right perinephric and periureteral stranding. Extensive renal vascular calcifications. No renal or ureteral calculus is identified. No left-sided hydronephrosis. Left ureter unremarkable. Thin amount of fat deposition within the wall of the bladder, slightly more prominent compared to the prior exam. Mild diffuse urinary bladder wall thickening.  Stomach/Bowel: Stomach is within normal limits. Appendix appears normal. Scattered colonic diverticulosis. No evidence of bowel wall thickening, distention, or inflammatory changes.  Vascular/Lymphatic: Extensive atherosclerotic calcifications of the aortoiliac axis and throughout all visualized abdominopelvic vasculature. No abdominopelvic lymphadenopathy.  Reproductive: Mildly prominent prostate gland.  Other: Peritoneal dialysis catheter with tip coiled within the pelvis. Moderate amount of ascites. No abdominal wall hernia.  Musculoskeletal: No acute or significant osseous findings.  IMPRESSION: 1. Mild-to-moderate right-sided hydronephrosis without discrete renal or ureteral stone. 2. Mild circumferential urinary bladder wall thickening, which could represent cystitis. Correlate with urinalysis. 3. Cholelithiasis and biliary sludge noted within the gallbladder. 4. Peritoneal dialysis catheter with moderate ascites. 5. Mildly enlarged prostate gland. 6. Extensive atherosclerosis.   Electronically Signed   By: Davina Poke M.D.   On: 06/12/2019 16:10    CT scan was personally reviewed today.  Agree with  radiologic interpretation.  Assessment & Plan:    1. Hydronephrosis, unspecified hydronephrosis type Differential diagnosis discussed including infection, passage of debris/stone or possible intraluminal obstruction, reflux amongst others.  Given his extensive smoking history as well as presence of red blood cells on urinalysis today, strongly recommended hematuria evaluation with CT urogram.  His wife is concerned  today that he had a "allergic" response that causes renal failure, explained that since he is already on dialysis, this should not have any impact on his very poor renal function at baseline.  Do not suspect reflux as the bladder appeared reasonably decompressed at the time of study as underlying cause.  Recommend CT urogram and cystoscopy for further evaluation of the above.  - Urinalysis, Complete w Microscopic; Future - Urine Culture; Future  2. Right flank pain Acute episode likely related to pathology seen on CT scan  Interestingly today, he points to his left side as the side with discomfort at CVA  3. Microscopic hematuria CT urogram and cystoscopy as above  4. Former smoker Risk factor for UTUC and bladder cancer  5. Slow urinary stream/ hesitancy Unable to bladder scan today as patient is on peritoneal dialysis thus this would be an accurate  Plan to treat with flomax to see if this improves urinary symptoms  We will hold off on PSA screening at this point in time when his severe medical comorbidities, reassess after completion of above work-up  Return in about 4 weeks (around 07/17/2019) for imaging./ cysto  Hollice Espy, MD  [facility] [address] [place], [place] [address] [phone]

## 2019-06-20 ENCOUNTER — Other Ambulatory Visit
Admission: RE | Admit: 2019-06-20 | Discharge: 2019-06-20 | Disposition: A | Discharge status: Home / Self Care | Payer: Medicare Other | Source: Ambulatory Visit | Attending: Physician Assistant | Admitting: Physician Assistant

## 2019-06-20 DIAGNOSIS — B999 Unspecified infectious disease: Secondary | ICD-10-CM | POA: Diagnosis present

## 2019-06-20 LAB — URINE CULTURE: Culture: NO GROWTH

## 2019-06-23 ENCOUNTER — Other Ambulatory Visit: Payer: Self-pay

## 2019-06-23 ENCOUNTER — Ambulatory Visit (INDEPENDENT_AMBULATORY_CARE_PROVIDER_SITE_OTHER): Payer: Medicare Other | Admitting: Cardiology

## 2019-06-23 ENCOUNTER — Encounter: Payer: Self-pay | Admitting: Cardiology

## 2019-06-23 VITALS — BP 158/72 | HR 83 | Ht 69.0 in | Wt 211.0 lb

## 2019-06-23 DIAGNOSIS — I10 Essential (primary) hypertension: Secondary | ICD-10-CM | POA: Diagnosis not present

## 2019-06-23 DIAGNOSIS — I2510 Atherosclerotic heart disease of native coronary artery without angina pectoris: Secondary | ICD-10-CM | POA: Diagnosis not present

## 2019-06-23 DIAGNOSIS — R0609 Other forms of dyspnea: Secondary | ICD-10-CM

## 2019-06-23 DIAGNOSIS — I739 Peripheral vascular disease, unspecified: Secondary | ICD-10-CM

## 2019-06-23 DIAGNOSIS — R0600 Dyspnea, unspecified: Secondary | ICD-10-CM

## 2019-06-23 DIAGNOSIS — I5020 Unspecified systolic (congestive) heart failure: Secondary | ICD-10-CM

## 2019-06-23 LAB — AEROBIC CULTURE W GRAM STAIN (SUPERFICIAL SPECIMEN)

## 2019-06-23 MED ORDER — ISOSORBIDE MONONITRATE ER 30 MG PO TB24: 30.0000 mg | ORAL_TABLET | Freq: Every day | ORAL | 3 refills | Status: DC

## 2019-06-23 NOTE — Patient Instructions (Signed)
Medication Instructions:  Your physician has recommended you make the following change in your medication:   1) STOP Amlodipine 2) START Imdur 30mg  daily. An Rx has been sent to your pharmacy   *If you need a refill on your cardiac medications before your next appointment, please call your pharmacy*  Lab Work: None ordered If you have labs (blood work) drawn today and your tests are completely normal, you will receive your results only by: [name] MyChart Message (if you have MyChart) OR . A paper copy in the mail If you have any lab test that is abnormal or we need to change your treatment, we will call you to review the results.  Testing/Procedures: Your physician has requested that you have an echocardiogram. Echocardiography is a painless test that uses sound waves to create images of your heart. It provides your doctor with information about the size and shape of your heart and how well your heart's chambers and valves are working. This procedure takes approximately one hour. There are no restrictions for this procedure.  Your physician has requested that you have a lexiscan myoview. For further information please visit [email or web address]. Please follow instruction sheet, as given.    Follow-Up: At [facility], you and your health needs are our priority.  As part of our continuing mission to provide you with exceptional heart care, we have created designated Provider Care Teams.  These Care Teams include your primary Cardiologist (physician) and Advanced Practice Providers (APPs -  Physician Assistants and Nurse Practitioners) who all work together to provide you with the care you need, when you need it.  Your next appointment:   3 months  The format for your next appointment:   In Person  Provider:    You may see Kate Sable, MD or one of the following Advanced Practice Providers on your designated Care Team:    Murray Hodgkins, NP  Christell Faith, PA-C  Marrianne Mood, PA-C   Other Instructions  [place]  Your caregiver has ordered a Stress Test with nuclear imaging. The purpose of this test is to evaluate the blood supply to your heart muscle. This procedure is referred to as a "Non-Invasive Stress Test." This is because other than having an IV started in your vein, nothing is inserted or "invades" your body. Cardiac stress tests are done to find areas of poor blood flow to the heart by determining the extent of coronary artery disease (CAD). Some patients exercise on a treadmill, which naturally increases the blood flow to your heart, while others who are  unable to walk on a treadmill due to physical limitations have a pharmacologic/chemical stress agent called Lexiscan . This medicine will mimic walking on a treadmill by temporarily increasing your coronary blood flow.   Please note: these test may take anywhere between 2-4 hours to complete  PLEASE REPORT TO [facility] AT THE FIRST DESK WILL DIRECT YOU WHERE TO GO  Date of Procedure:_____________________________________  Arrival Time for Procedure:______________________________  Instructions regarding medication:   _X___ : Hold diabetes medication morning of procedure  _X___:  Hold betablocker(Carvediolol) night before procedure and morning of procedure  _X___:  Hold other medications as follows:____HOLD your Furisemide the morning of the test. You can take it later in the day._____________________________________________________________________________________________________________________________________________________________________________________________________________________________________________________________________________________  PLEASE NOTIFY THE OFFICE AT LEAST 24 HOURS IN ADVANCE IF YOU ARE UNABLE TO [facility].  [phone] AND  PLEASE NOTIFY NUCLEAR MEDICINE AT [facility] AT LEAST 24 HOURS IN  ADVANCE IF YOU ARE UNABLE TO  KEEP YOUR APPOINTMENT. [phone]  How to prepare for your Myoview test:  1. Do not eat or drink after midnight 2. No caffeine for 24 hours prior to test 3. No smoking 24 hours prior to test. 4. Your medication may be taken with water.  If your doctor stopped a medication because of this test, do not take that medication. 5. Ladies, please do not wear dresses.  Skirts or pants are appropriate. Please wear a short sleeve shirt. 6. No perfume, cologne or lotion. 7. Wear comfortable walking shoes. No heels!

## 2019-06-23 NOTE — Progress Notes (Signed)
Cardiology Office Note:    Date:  06/23/2019   ID:  Scott [name], DOB Oct 12, 1960, MRN [id number]  PCP:  [facility], [place]  Cardiologist:  Kate Sable, MD  Electrophysiologist:  None   Referring MD: Kris Hartmann, NP   Chief Complaint  Patient presents with  . New Patient (Initial Visit)    Referred by PCP. Patient c/o SOB. Meds reviewed verbally with patient.     History of Present Illness:    Scott [name] is a 58 y.o. male with a hx of CAD, NSTEMI status post PCI (3 stent placement to the LAD, OM2 and PDA in 2017), PAD, ESRD on peritoneal dialysis, hypertension, diabetes who presents as a referral due to shortness of breath.  As per chart review, patient lastly saw Dr. Nehemiah Massed in 2018.  Last echocardiogram January 2020 showed moderately reduced ejection fraction with EF of 35 to 40%.  She states feeling more shortness of breath with exertion of late.  Going to the grocery store or walking causes him to get out of breath easily and this has worsened over the past couple of months.  He has not seen a cardiologist in about 2 years.  He recently had his left toe amputated due to peripheral artery disease.  His last stress test was in 2017 which was abnormal.  At that time a left heart cath was performed at [facility] in [place] which showed CAD.  3 stents were placed in his heart.  Some reason he just did not follow-up.  He performs peritoneal dialysis by himself daily.  He also has an AV fistula for hemodialysis in case he needs it.  Past Medical History:  Diagnosis Date  . Arthritis    "left arm; right leg" (12/13/2014)  . Asthma   . CHF (congestive heart failure) ([place])   . Chronic disease anemia    [name] 12/13/2014  . Chronic kidney disease (CKD), stage IV (severe) ([place])    [name] 12/13/2014... on dialysis  . Complication of anesthesia    unable to urinate after CAPD urgery  . Continuous ambulatory peritoneal dialysis status ([place])   . Coronary artery  disease    [name] 12/13/2014  . Depression   . Dysrhythmia    patient unaware of irregular heartbeat  . GERD (gastroesophageal reflux disease)   . High cholesterol    [name] 12/13/2014  . Hypertension   . Non-Q wave myocardial infarction ([place])    [name] 12/13/2014  . PVD (peripheral vascular disease) ([place])    [name] 12/13/2014  . Type II diabetes mellitus ([place])     Past Surgical History:  Procedure Laterality Date  . AMPUTATION TOE Left 05/24/2019   Procedure: Left great toe amputation;  Surgeon: Caroline More, DPM;  Location: [facility]S;  Service: Podiatry;  Laterality: Left;  . AV FISTULA PLACEMENT Right 04/07/2017   Procedure: ARTERIOVENOUS (AV) FISTULA CREATION ( BRACHIALCEPHALIC );  Surgeon: Katha Cabal, MD;  Location: [facility];  Service: Vascular;  Laterality: Right;  . CAPD INSERTION N/A 04/07/2017   Procedure: LAPAROSCOPIC INSERTION CONTINUOUS AMBULATORY PERITONEAL DIALYSIS  (CAPD) CATHETER;  Surgeon: Katha Cabal, MD;  Location: [facility];  Service: Vascular;  Laterality: N/A;  . CARDIAC CATHETERIZATION  11/2014  . COLONOSCOPY WITH PROPOFOL N/A 12/01/2017   Procedure: COLONOSCOPY WITH PROPOFOL;  Surgeon: Lin Landsman, MD;  Location: [place];  Service: Endoscopy;  Laterality: N/A;  Diabetic - insulin  . COLONOSCOPY WITH PROPOFOL N/A 12/29/2017   Procedure: COLONOSCOPY  WITH PROPOFOL;  Surgeon: Lin Landsman, MD;  Location: [place];  Service: Endoscopy;  Laterality: N/A;  . DIALYSIS/PERMA CATHETER INSERTION N/A 01/18/2017   Procedure: Dialysis/Perma Catheter Insertion;  Surgeon: Algernon Huxley, MD;  Location: [place] [facility];  Service: Cardiovascular;  Laterality: N/A;  . DIALYSIS/PERMA CATHETER REMOVAL N/A 07/26/2017   Procedure: DIALYSIS/PERMA CATHETER REMOVAL;  Surgeon: Algernon Huxley, MD;  Location: [place] [facility];  Service: Cardiovascular;  Laterality: N/A;  . INCISION AND DRAINAGE OF WOUND Right ~ 10/2014   "4th toe foot"  . LOWER  EXTREMITY ANGIOGRAPHY Left 05/23/2019   Procedure: Lower Extremity Angiography;  Surgeon: Katha Cabal, MD;  Location: [place] [facility];  Service: Cardiovascular;  Laterality: Left;  . PERCUTANEOUS CORONARY STENT INTERVENTION (PCI-S) N/A 12/14/2014   Procedure: PERCUTANEOUS CORONARY STENT INTERVENTION (PCI-S);  Surgeon: Charolette Forward, MD;  Location: [facility] C[facility] LAB;  Service: Cardiovascular;  Laterality: N/A;  . PERCUTANEOUS CORONARY STENT INTERVENTION (PCI-S) N/A 12/18/2014   Procedure: PERCUTANEOUS CORONARY STENT INTERVENTION (PCI-S);  Surgeon: Charolette Forward, MD;  Location: [facility] CATH LAB;  Service: Cardiovascular;  Laterality: N/A;  . POLYPECTOMY  12/01/2017   Procedure: POLYPECTOMY;  Surgeon: Lin Landsman, MD;  Location: [place];  Service: Endoscopy;;  . POLYPECTOMY  12/29/2017   Procedure: POLYPECTOMY;  Surgeon: Lin Landsman, MD;  Location: [place];  Service: Endoscopy;;  . TOE AMPUTATION Right 2015   4th toe    Current Medications: Current Meds  Medication Sig  . albuterol (PROVENTIL HFA;VENTOLIN HFA) 108 (90 BASE) MCG/ACT inhaler Inhale 2 puffs into the lungs every 6 (six) hours as needed for wheezing or shortness of breath.  [name] aspirin EC 81 MG tablet Take 1 tablet (81 mg total) by mouth daily.  [name] atorvastatin (LIPITOR) 40 MG tablet Take 40 mg by mouth daily.  . B Complex-C-Folic Acid (DIALYVITE 623) 0.8 MG TABS Take 1 tablet by mouth daily.  . calcitRIOL (ROCALTROL) 0.25 MCG capsule Take 0.25 mcg by mouth daily.  . calcium acetate (PHOSLO) 667 MG capsule Take 2,001 mg by mouth 3 (three) times daily with meals. Takes 1-2 caps if he has snacks  . carvedilol (COREG) 25 MG tablet Take 1 tablet (25 mg total) by mouth 2 (two) times daily with a meal.  . Cholecalciferol (VITAMIN D) 2000 units tablet Take 2,000 Units by mouth daily. Unsure how many units  . clopidogrel (PLAVIX) 75 MG tablet Take 1 tablet (75 mg total) by mouth daily with breakfast.  .  furosemide (LASIX) 80 MG tablet Take 80 mg by mouth daily.  [name] gentamicin cream (GARAMYCIN) 0.1 % Apply 1 application topically daily.  [name] HYDROcodone-acetaminophen (NORCO/VICODIN) 5-325 MG tablet Take 1 tablet by mouth every 4 (four) hours as needed.  . insulin aspart protamine- aspart (NOVOLOG MIX 70/30) (70-30) 100 UNIT/ML injection Inject 0.25 mLs (25 Units total) into the skin 2 (two) times daily with a meal. (Patient taking differently: Inject 20-25 Units into the skin 2 (two) times daily with a meal. )  . nitroGLYCERIN (NITROSTAT) 0.4 MG SL tablet Place 0.4 mg under the tongue every 5 (five) minutes as needed for chest pain. Pt needs new Rx. Bottle has expried  . omeprazole (PRILOSEC) 20 MG capsule Take 1 capsule (20 mg total) by mouth daily.  . ONE TOUCH ULTRA TEST test strip   . tamsulosin (FLOMAX) 0.4 MG CAPS capsule Take 1 capsule (0.4 mg total) by mouth daily.  . [DISCONTINUED] amLODipine (NORVASC) 10 MG  tablet Take 10 mg by mouth daily.     Allergies:   Patient has no known allergies.   Social History   Socioeconomic History  . Marital status: Significant Other    Spouse name: Not on file  . Number of children: Not on file  . Years of education: Not on file  . Highest education level: Not on file  Occupational History  . Not on file  Social Needs  . Financial resource strain: Not on file  . Food insecurity    Worry: Not on file    Inability: Not on file  . Transportation needs    Medical: Not on file    Non-medical: Not on file  Tobacco Use  . Smoking status: Former Smoker    Packs/day: 1.00    Years: 30.00    Pack years: 30.00    Types: Cigarettes    Start date: 08/31/1984    Quit date: 11/30/2014    Years since quitting: 4.5  . Smokeless tobacco: Never Used  Substance and Sexual Activity  . Alcohol use: Yes    Comment: Rarely, twice a year.  . Drug use: No  . Sexual activity: Not Currently  Lifestyle  . Physical activity    Days per week: Not on file     Minutes per session: Not on file  . Stress: Not on file  Relationships  . Social Herbalist on phone: Not on file    Gets together: Not on file    Attends religious service: Not on file    Active member of club or organization: Not on file    Attends meetings of clubs or organizations: Not on file    Relationship status: Not on file  Other Topics Concern  . Not on file  Social History Narrative   Lives at home with girlfriend and son     Family History: The patient's family history includes CAD in his brother; Cancer in his brother, father, and mother; Diabetes in his brother and sister; Hernia in his son.  ROS:   Please see the history of present illness.     All other systems reviewed and are negative.  EKGs/Labs/Other Studies Reviewed:    The following studies were reviewed today:  Echo 09/18/2018 Study Conclusions  - Left ventricle: The cavity size was mildly dilated. Systolic   function was moderately reduced. The estimated ejection fraction   was in the range of 35% to 40%. Diffuse hypokinesis. - Aortic valve: There was trivial regurgitation. - Mitral valve: There was mild regurgitation. - Left atrium: The atrium was mildly dilated.  EKG:  EKG is  ordered today.  The ekg ordered today demonstrates normal sinus rhythm, possible left atrial enlargement, right bundle branch block nonspecific T wave changes.  Recent Labs: 09/17/2018: B Natriuretic Peptide 521.0 09/18/2018: Magnesium 2.0 06/12/2019: ALT 10; BUN 64; Creatinine, Ser 10.18; Hemoglobin 9.1; Platelets 283; Potassium 4.2; Sodium 136  Recent Lipid Panel    Component Value Date/Time   CHOL 100 12/14/2014 0444   CHOL 146 12/02/2014 0714   TRIG 98 12/14/2014 0444   TRIG 134 12/02/2014 0714   HDL 37 (L) 12/14/2014 0444   HDL 45 12/02/2014 0714   CHOLHDL 2.7 12/14/2014 0444   VLDL 20 12/14/2014 0444   VLDL 27 12/02/2014 0714   LDLCALC 43 12/14/2014 0444   LDLCALC 74 12/02/2014 0714    Physical  Exam:    VS:  BP (!) 158/72 (BP Location: Left Arm, Patient  Position: Sitting, Cuff Size: Normal)   Pulse 83   Ht 5\' 9"  (1.753 m)   Wt 211 lb (95.7 kg)   BMI 31.16 kg/m     Wt Readings from Last 3 Encounters:  06/23/19 211 lb (95.7 kg)  06/19/19 187 lb (84.8 kg)  06/12/19 213 lb 13.5 oz (97 kg)     GEN:  Well nourished, well developed in no acute distress HEENT: Normal NECK: No JVD; No carotid bruits LYMPHATICS: No lymphadenopathy CARDIAC: RRR, no murmurs, rubs, gallops RESPIRATORY:  Clear to auscultation anteriorly, decreased inspiratory effort, decreased breath sounds especially at the left lung base. ABDOMEN: Soft, non-tender, non-distended, peritoneal dialysis catheter noted. MUSCULOSKELETAL:  No edema; left foot in cast, AV fistula in right arm noted. SKIN: Warm and dry NEUROLOGIC:  Alert and oriented x 3 PSYCHIATRIC:  Normal affect   ASSESSMENT:   Patient with history of CAD and dyspnea on exertion.  Dyspnea on exertion could be secondary to worsening heart failure, or an anginal equivalent due to his CAD and stent history.  1. DOE (dyspnea on exertion)   2. Essential hypertension   3. Coronary artery disease involving native coronary artery of native heart without angina pectoris   4. HFrEF (heart failure with reduced ejection fraction) ([place])    PLAN:     1. Get echocardiogram, get Lexiscan myocardial perfusion imaging. 2. Stop amlodipine.  Start Imdur 30 mg daily.  Continue Coreg 25 mg twice daily, hydralazine 25 mg 3 times daily, Lasix for heart failure. 3. Continue aspirin, Plavix, Lipitor due to history of CAD and PAD. 4. Patient encouraged to check blood pressure frequently and will plan to increase hydralazine and or Imdur if blood pressure is elevated after stopping amlodipine.  We will up after above testing.  Medication Adjustments/Labs and Tests Ordered: Current medicines are reviewed at length with the patient today.  Concerns regarding medicines are  outlined above.  Orders Placed This Encounter  Procedures  . NM Myocar Multi W/Spect W/Wall Motion / EF  . EKG 12-Lead  . ECHOCARDIOGRAM COMPLETE   Meds ordered this encounter  Medications  . isosorbide mononitrate (IMDUR) 30 MG 24 hr tablet    Sig: Take 1 tablet (30 mg total) by mouth daily.    Dispense:  90 tablet    Refill:  3    Patient Instructions  Medication Instructions:  Your physician has recommended you make the following change in your medication:   1) STOP Amlodipine 2) START Imdur 30mg  daily. An Rx has been sent to your pharmacy   *If you need a refill on your cardiac medications before your next appointment, please call your pharmacy*  Lab Work: None ordered If you have labs (blood work) drawn today and your tests are completely normal, you will receive your results only by: [name] MyChart Message (if you have MyChart) OR . A paper copy in the mail If you have any lab test that is abnormal or we need to change your treatment, we will call you to review the results.  Testing/Procedures: Your physician has requested that you have an echocardiogram. Echocardiography is a painless test that uses sound waves to create images of your heart. It provides your doctor with information about the size and shape of your heart and how well your heart's chambers and valves are working. This procedure takes approximately one hour. There are no restrictions for this procedure.  Your physician has requested that you have a lexiscan myoview. For further information please visit [email or web address].  Please follow instruction sheet, as given.    Follow-Up: At [facility], you and your health needs are our priority.  As part of our continuing mission to provide you with exceptional heart care, we have created designated Provider Care Teams.  These Care Teams include your primary Cardiologist (physician) and Advanced Practice Providers (APPs -  Physician Assistants and Nurse  Practitioners) who all work together to provide you with the care you need, when you need it.  Your next appointment:   3 months  The format for your next appointment:   In Person  Provider:    You may see Kate Sable, MD or one of the following Advanced Practice Providers on your designated Care Team:    Murray Hodgkins, NP  Christell Faith, PA-C  Marrianne Mood, PA-C   Other Instructions  [place]  Your caregiver has ordered a Stress Test with nuclear imaging. The purpose of this test is to evaluate the blood supply to your heart muscle. This procedure is referred to as a "Non-Invasive Stress Test." This is because other than having an IV started in your vein, nothing is inserted or "invades" your body. Cardiac stress tests are done to find areas of poor blood flow to the heart by determining the extent of coronary artery disease (CAD). Some patients exercise on a treadmill, which naturally increases the blood flow to your heart, while others who are  unable to walk on a treadmill due to physical limitations have a pharmacologic/chemical stress agent called Lexiscan . This medicine will mimic walking on a treadmill by temporarily increasing your coronary blood flow.   Please note: these test may take anywhere between 2-4 hours to complete  PLEASE REPORT TO [place] AT THE FIRST DESK WILL DIRECT YOU WHERE TO GO  Date of Procedure:_____________________________________  Arrival Time for Procedure:______________________________  Instructions regarding medication:   _X___ : Hold diabetes medication morning of procedure  _X___:  Hold betablocker(Carvediolol) night before procedure and morning of procedure  _X___:  Hold other medications as follows:____HOLD your Furisemide the morning of the test. You can take it later in the  day._____________________________________________________________________________________________________________________________________________________________________________________________________________________________________________________________________________________  PLEASE NOTIFY THE OFFICE AT LEAST 24 HOURS IN ADVANCE IF YOU ARE UNABLE TO [place].  [phone] AND  PLEASE NOTIFY NUCLEAR MEDICINE AT [facility] AT LEAST 24 HOURS IN ADVANCE IF YOU ARE UNABLE TO KEEP YOUR APPOINTMENT. [phone]  How to prepare for your Myoview test:  1. Do not eat or drink after midnight 2. No caffeine for 24 hours prior to test 3. No smoking 24 hours prior to test. 4. Your medication may be taken with water.  If your doctor stopped a medication because of this test, do not take that medication. 5. Ladies, please do not wear dresses.  Skirts or pants are appropriate. Please wear a short sleeve shirt. 6. No perfume, cologne or lotion. 7. Wear comfortable walking shoes. No heels!               Signed, Kate Sable, MD  06/23/2019 2:33 PM    [place]

## 2019-06-26 ENCOUNTER — Ambulatory Visit (INDEPENDENT_AMBULATORY_CARE_PROVIDER_SITE_OTHER): Payer: Medicare Other | Admitting: Vascular Surgery

## 2019-06-26 ENCOUNTER — Encounter (INDEPENDENT_AMBULATORY_CARE_PROVIDER_SITE_OTHER): Payer: Medicare Other

## 2019-06-28 ENCOUNTER — Other Ambulatory Visit: Payer: Self-pay

## 2019-06-28 ENCOUNTER — Encounter: Payer: Medicare Other | Admitting: Internal Medicine

## 2019-06-28 DIAGNOSIS — E11621 Type 2 diabetes mellitus with foot ulcer: Secondary | ICD-10-CM | POA: Diagnosis not present

## 2019-06-29 NOTE — Progress Notes (Signed)
[name], [name] ([id number]) Visit Report for 06/28/2019 HPI Details Patient Name: [name], [name] Date of Service: 06/28/2019 8:30 AM Medical Record Number: [id number] Patient Account Number: [id number] Date of Birth/Sex: [birth year] (58 y.o. M) Treating RN: Scott [name] Primary Care Provider: Norville [name] Other Clinician: Referring Provider: [facility] Treating Provider/Extender: Tito Dine in Treatment: 2 History of Present Illness HPI Description: The patient is a pleasant 58 year old with a past medical history significant for type 2 diabetes (hemoglobin A1c 13.2 in June 2015), peripheral neuropathy, PVD, and smoking. 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 Sharlotte [name] in Nov 2014 as well as RLE angioplasty by Dr. Leotis [name]. He says that the surgical site never healed. He reports moderate [name] at the site of his ulceration with pressure. He also describes symptoms consistent with claudication. No ischemic rest [name]. ABI on the right is 0.99. Monophasic DP. Xray 08/01/2014 showed no definite osteomyelitis, MRI 08/17/2014 negative for osteomyelitis. Biopsy cultures from 08/01/2014 grew Myroides species, sensitive only to imipenen. Completed 2 week course of doxycycline. Sensitivity to doxy requested but not performed. TCOM 68mmHg. Scheduled to see Dr. Cleda [name] and Dr. Lucky [name] in f/u. We have discussed hyperbaric oxygen therapy for his nonhealing Wagner grade 4 ulceration as well as arterial insufficiency but wishes to hold off until he obtains Medicaid coverage, which he is pursuing. He is offloading with a Darco shoe. Has declined total contact casting. Last week he presented with a small area of cellulitis on the dorsum of his right forefoot. Cultures grew methicillin sensitive staph aureus, sensitive to tigecycline. He was started on doxycycline. He returns to clinic for follow-up today and reports worsening  redness, swelling, [name], and drainage. He denies any fevers. Readmission: Patient presents today for initial evaluation here in our clinic as a referral from Dr. Luana [name] secondary to issues that he has been having with his wound. 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 His left great toe and it appears part of the first metatarsal which was amputated. With that being said the area of amputation actually does show some region of necrotic tissue where the flap does not seem to be taking. There is definite necrotic tissue underneath as well the sutures are still in place. Once the sutures are removed I do believe that a lot of this is getting need to be cleaned away. He sees Dr. Luana [name] on Wednesday and at that time will be able to see how things are actually doing once he is able to remove some of the necrotic tissue here. I believe this patient may be a good candidate for a wound VAC based on what I am seeing. With that being said we have discussed the possibility of hyperbaric oxygen therapy. With that being said my concern in that regard is that of his ejection fraction which is somewhat low to consider hyperbarics based on his last test which was on January 20 of 2020 where he registered at 35 to 40%. With that being said I definitely believe that being that this is greater than 6 months he would definitely need to see cardiology and have his ejection fraction rechecked prior to even consideration of the hyperbarics. The other thing is I am not sure that there is actually anything remaining which is showing signs of infection at this time hence the reason that he may also not be a hyperbaric candidate based on that. Obviously the patient  can go into the hyperbaric oxygen chamber for a Waggoner grade 3 ulcer but again at this point I am not sure that it qualifies as such based on what I am seeing. There is no signs of active infection currently. This is also out of the timeframe for a  failed graft/flap. Patient does have end-stage renal disease and is currently on peritoneal dialysis. He is also having a lot of [name] in his abdomen especially on the right side today I am unsure if this could potentially be a kidney stone or something else. His blood pressure was also very faint upon evaluation he is actually going to the ER upon leaving here today he just wanted to get this appointment and first. His most recent hemoglobin A1c on 05/22/2019 was 6.1. He does see Dr. Luana [name] on Wednesday and we have gotten permission through the referral to [place] to see this patient even though they are in the postop global 90-day period. 06/19/2019 on evaluation today patient presents for follow-up concerning his issues with his left amputation site. He did see Dr. Luana [name] last week and they remove the sutures from the wound. Unfortunately this has dehisced and he does have necrotic Scheffler, Madex A. ([id number]) tissue noted in the base of the wound. Fortunately there is no signs of obvious or significant active infection is not on any antibiotics currently though he has been having issues with his kidneys he actually saw the kidney doctor earlier today he may be placed on antibiotics for that shortly. Nonetheless he again was sent for consideration of hyperbaric oxygen therapy but again the patient also has congestive heart failure, chronic kidney disease for which he undergoes peritoneal dialysis, and overall he seems to be in somewhat poor health I am a little concerned about the hyperbaric chamber with regard to his safety. We definitely would need to have a recent echocardiogram to evaluate ejection fraction prior to diving at minimum. Nonetheless I do not see obvious signs of infection at this point and again the majority of the necrotic bone was stated to have been removed based on review of the patient's notes. 10/28; patient I am seeing for the first time. He has a left first ray amputation  site. Dehisced surgical wound. The patient was revascularized on 05/23/2019. At that time his common femoral was diffusely diseased but without hemodynamically significant stenosis there was a 50% stenosis at the origin of the SFA. There is a stent in the SFA as well. Profunda femoris 90% stenosed at its origin which collateralizes poorly to the distal left lower extremity arterial system. 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 He had angioplasty was done of the anterior tibial with less than 10% residual stenosis. The posterior tibial was occluded there was also diffuse disease in the proximal one third of the peroneal The patient is using Vach solution wet-to-dry to the deeply necrotic wound which was his left first ray amputation site. He was initially referred here by his podiatrist Dr. Luana [name] at [facility] for review for consideration of hyperbaric oxygen. In order to go forward with hyperbarics we would need to prove that he has deep tissue infection either myositis tendinitis or osteomyelitis which is seems quite likely to me he has although I cannot see the absolute proof of this at the moment. Necrotic wound with PAD even in the setting of 2 type 2 diabetes would not qualify him necessarily The patient has completed his Cipro for gram-negative's that were cultured with  a swab culture. Electronic Signature(s) Signed: 06/28/2019 5:54:02 PM By: Linton Ham MD Entered By: Linton Ham on 06/28/2019 10:31:45 Sawaya, Scott [name] Kitchen ([id number]) -------------------------------------------------------------------------------- Physical Exam Details Patient Name: Delafuente, Chord A. Date of Service: 06/28/2019 8:30 AM Medical Record Number: [id number] Patient Account Number: [id number] Date of Birth/Sex: 1960-12-20 (58 y.o. M) Treating RN: Scott [name] Primary Care Provider: Norville [name] Other Clinician: Referring Provider: [facility] Treating Provider/Extender:  Ricard Dillon Weeks in Treatment: 2 Constitutional Sitting or standing Blood Pressure is within target range for patient.. Pulse regular and within target range for patient.[name] Respirations regular, non-labored and within target range.. Temperature is normal and within the target range for the patient.[name] appears in no distress. Eyes Conjunctivae clear. No discharge. Respiratory Respiratory effort is easy and symmetric bilaterally. Rate is normal at rest and on room air.. Bilateral breath sounds are clear and equal in all lobes with no wheezes, rales or rhonchi.. Cardiovascular Heart rhythm and rate regular, without murmur or gallop.. Pedal pulses are not palpable on the left. I do not feel the left popliteal. Lymphatic None palpable. Integumentary (Hair, Skin) There is no surrounding erythema although there is tenderness proximal from the wound. Psychiatric No evidence of depression, anxiety, or agitation. Calm, cooperative, and communicative. Appropriate interactions and affect.. Notes Wound exam; deeply necrotic wound on the medial aspect of the left foot. This was initially a surgical incision. This is almost 100% covered in necrotic debris. There is exposed bone both proximally and the wound and also distally I think over the remanent of the MTP joint. It would be possible to get a deep bone culture and pathology. He will likely also need an MRI Electronic Signature(s) Signed: 06/28/2019 5:54:02 PM By: Linton Ham MD Entered By: Linton Ham on 06/28/2019 10:30:57 [name], [name] ([id number]) -------------------------------------------------------------------------------- Physician Orders Details Patient Name: [name] Date of Service: 06/28/2019 8:30 AM Medical Record Number: [id number] Patient Account Number: [id number] Date of Birth/Sex: 05/26/1961 (58 y.o. M) Treating RN: Scott [name] Primary Care Provider: Norville [name] Other Clinician: Referring Provider:  [facility] Treating Provider/Extender: Tito Dine in Treatment: 2 Verbal / Phone Orders: No Diagnosis Coding Wound Cleansing Wound #4 Left Amputation Site - Toe o Clean wound with Normal Saline. Anesthetic (add to Medication List) Wound #4 Left Amputation Site - Toe o Topical Lidocaine 4% cream applied to wound bed prior to debridement (In Clinic Only). Primary Wound Dressing Wound #4 Left Amputation Site - Toe o Other: - Vashe, apply to gauze, squeeze out excess apply to wound Secondary Dressing Wound #4 Left Amputation Site - Toe o ABD pad - rolled gauze to secure Dressing Change Frequency Wound #4 Left Amputation Site - Toe o Change dressing every day. Follow-up Appointments Wound #4 Left Amputation Site - Toe o Return Appointment in 1 week. Off-Loading o Open toe surgical shoe with peg assist. Notes Appointment with Dr. Luana [name] today Electronic Signature(s) Signed: 06/28/2019 5:54:02 PM By: Linton Ham MD Signed: 06/29/2019 10:04:30 AM By: Gretta Cool, BSN, RN, CWS, Kim RN, BSN Entered By: Gretta Cool, BSN, RN, CWS, Kim on 06/28/2019 09:29:51 Sharron, Scott [name] Kitchen ([id number]) -------------------------------------------------------------------------------- Problem List Details Patient Name: [name], [name] Date of Service: 06/28/2019 8:30 AM Medical Record Number: [id number] Patient Account Number: [id number] Date of Birth/Sex: 1960-10-30 (58 y.o. M) Treating RN: Scott [name] Primary Care Provider: Norville [name] Other Clinician: Referring Provider: Norville [name] Treating Provider/Extender: Tito Dine in Treatment: 2 Active Problems ICD-10 Evaluated Encounter Code Description Active Date Today Diagnosis E11.621 Type  2 diabetes mellitus with foot ulcer 06/12/2019 No Yes T81.31XA Disruption of external operation (surgical) wound, not 06/12/2019 No Yes elsewhere classified, initial encounter L97.522 Non-pressure chronic ulcer of other part  of left foot with fat 06/12/2019 No Yes layer exposed N18.6 End stage renal disease 06/12/2019 No Yes Z99.2 Dependence on renal dialysis 06/12/2019 No Yes I10 Essential (primary) hypertension 06/12/2019 No Yes Inactive Problems Resolved Problems Electronic Signature(s) Signed: 06/28/2019 5:54:02 PM By: Linton Ham MD Entered By: Linton Ham on 06/28/2019 10:18:53 Gettinger, [name] ([id number]) -------------------------------------------------------------------------------- Progress Note Details Patient Name: Pe, Joon A. Date of Service: 06/28/2019 8:30 AM Medical Record Number: [id number] Patient Account Number: [id number] Date of Birth/Sex: Feb 22, 1961 (58 y.o. M) Treating RN: Scott [name] Primary Care Provider: Norville [name] Other Clinician: Referring Provider: [facility] Treating Provider/Extender: Tito Dine in Treatment: 2 Subjective History of Present Illness (HPI) The patient is a pleasant 58 year old with a past medical history significant for type 2 diabetes (hemoglobin A1c 13.2 in June 2015), peripheral neuropathy, PVD, and smoking. 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 Sharlotte [name] in Nov 2014 as well as RLE angioplasty by Dr. Leotis [name]. He says that the surgical site never healed. He reports moderate [name] at the site of his ulceration with pressure. He also describes symptoms consistent with claudication. No ischemic rest [name]. ABI on the right is 0.99. Monophasic DP. Xray 08/01/2014 showed no definite osteomyelitis, MRI 08/17/2014 negative for osteomyelitis. Biopsy cultures from 08/01/2014 grew Myroides species, sensitive only to imipenen. Completed 2 week course of doxycycline. Sensitivity to doxy requested but not performed. TCOM 48mmHg. Scheduled to see Dr. Cleda [name] and Dr. Lucky [name] in f/u. We have discussed hyperbaric oxygen therapy for his nonhealing Wagner grade 4 ulceration as well  as arterial insufficiency but wishes to hold off until he obtains Medicaid coverage, which he is pursuing. He is offloading with a Darco shoe. Has declined total contact casting. Last week he presented with a small area of cellulitis on the dorsum of his right forefoot. Cultures grew methicillin sensitive staph aureus, sensitive to tigecycline. He was started on doxycycline. He returns to clinic for follow-up today and reports worsening redness, swelling, [name], and drainage. He denies any fevers. Readmission: Patient presents today for initial evaluation here in our clinic as a referral from Dr. Luana [name] secondary to issues that he has been having with his wound. 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 His left great toe and it appears part of the first metatarsal which was amputated. With that being said the area of amputation actually does show some region of necrotic tissue where the flap does not seem to be taking. There is definite necrotic tissue underneath as well the sutures are still in place. Once the sutures are removed I do believe that a lot of this is getting need to be cleaned away. He sees Dr. Luana [name] on Wednesday and at that time will be able to see how things are actually doing once he is able to remove some of the necrotic tissue here. I believe this patient may be a good candidate for a wound VAC based on what I am seeing. With that being said we have discussed the possibility of hyperbaric oxygen therapy. With that being said my concern in that regard is that of his ejection fraction which is somewhat low to consider hyperbarics based on his last test which was on January 20 of 2020  where he registered at 35 to 40%. With that being said I definitely believe that being that this is greater than 6 months he would definitely need to see cardiology and have his ejection fraction rechecked prior to even consideration of the hyperbarics. The other thing is I am not sure that  there is actually anything remaining which is showing signs of infection at this time hence the reason that he may also not be a hyperbaric candidate based on that. Obviously the patient can go into the hyperbaric oxygen chamber for a Waggoner grade 3 ulcer but again at this point I am not sure that it qualifies as such based on what I am seeing. There is no signs of active infection currently. This is also out of the timeframe for a failed graft/flap. Patient does have end-stage renal disease and is currently on peritoneal dialysis. He is also having a lot of [name] in his abdomen especially on the right side today I am unsure if this could potentially be a kidney stone or something else. His blood pressure was also very faint upon evaluation he is actually going to the ER upon leaving here today he just wanted to get this appointment and first. His most recent hemoglobin A1c on 05/22/2019 was 6.1. He does see Dr. Luana [name] on Wednesday and we have gotten permission through the referral to [place] to see this patient even though they are in the postop global 90-day period. 06/19/2019 on evaluation today patient presents for follow-up concerning his issues with his left amputation site. He did see Dr. Luana [name] last week and they remove the sutures from the wound. Unfortunately this has dehisced and he does have necrotic tissue noted in the base of the wound. Fortunately there is no signs of obvious or significant active infection is not on any antibiotics currently though he has been having issues with his kidneys he actually saw the kidney doctor earlier today he may be placed on antibiotics for that shortly. Nonetheless he again was sent for consideration of hyperbaric oxygen therapy but [name], [name] ([id number]) again the patient also has congestive heart failure, chronic kidney disease for which he undergoes peritoneal dialysis, and overall he seems to be in somewhat poor health I am a little concerned  about the hyperbaric chamber with regard to his safety. We definitely would need to have a recent echocardiogram to evaluate ejection fraction prior to diving at minimum. Nonetheless I do not see obvious signs of infection at this point and again the majority of the necrotic bone was stated to have been removed based on review of the patient's notes. 10/28; patient I am seeing for the first time. He has a left first ray amputation site. Dehisced surgical wound. The patient was revascularized on 05/23/2019. At that time his common femoral was diffusely diseased but without hemodynamically significant stenosis there was a 50% stenosis at the origin of the SFA. There is a stent in the SFA as well. Profunda femoris 90% stenosed at its origin which collateralizes poorly to the distal left lower extremity arterial system. 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 He had angioplasty was done of the anterior tibial with less than 10% residual stenosis. The posterior tibial was occluded there was also diffuse disease in the proximal one third of the peroneal The patient is using Vach solution wet-to-dry to the deeply necrotic wound which was his left first ray amputation site. He was initially referred here by his podiatrist Dr. Luana [name]  at [facility] clinic for review for consideration of hyperbaric oxygen. In order to go forward with hyperbarics we would need to prove that he has deep tissue infection either myositis tendinitis or osteomyelitis which is seems quite likely to me he has although I cannot see the absolute proof of this at the moment. Necrotic wound with PAD even in the setting of 2 type 2 diabetes would not qualify him necessarily The patient has completed his Cipro for gram-negative's that were cultured with a swab culture. Objective Constitutional Sitting or standing Blood Pressure is within target range for patient.. Pulse regular and within target range for  patient.[name] Respirations regular, non-labored and within target range.. Temperature is normal and within the target range for the patient.[name] appears in no distress. Vitals Time Taken: 8:55 AM, Height: 69 in, Weight: 215 lbs, BMI: 31.7, Temperature: 97.6 F, Pulse: 80 bpm, Respiratory Rate: 16 breaths/min, Blood Pressure: 132/42 mmHg. Eyes Conjunctivae clear. No discharge. Respiratory Respiratory effort is easy and symmetric bilaterally. Rate is normal at rest and on room air.. Bilateral breath sounds are clear and equal in all lobes with no wheezes, rales or rhonchi.. Cardiovascular Heart rhythm and rate regular, without murmur or gallop.. Pedal pulses are not palpable on the left. I do not feel the left popliteal. Lymphatic None palpable. Psychiatric No evidence of depression, anxiety, or agitation. Calm, cooperative, and communicative. Appropriate interactions and affect.. General Notes: Wound exam; deeply necrotic wound on the medial aspect of the left foot. This was initially a surgical incision. This is almost 100% covered in necrotic debris. There is exposed bone both proximally and the wound and also distally I think [name], [name] ([id number]) over the remanent of the MTP joint. It would be possible to get a deep bone culture and pathology. He will likely also need an MRI Integumentary (Hair, Skin) There is no surrounding erythema although there is tenderness proximal from the wound. Wound #4 status is Open. Original cause of wound was Surgical Injury. The wound is located on the Left Amputation Site - Toe. The wound measures 6.2cm length x 3.2cm width x 1.1cm depth; 15.582cm^2 area and 17.141cm^3 volume. There is Fat Layer (Subcutaneous Tissue) Exposed exposed. There is no tunneling or undermining noted. There is a medium amount of serous drainage noted. The wound margin is flat and intact. There is small (1-33%) granulation within the wound bed. There is a large (67-100%)  amount of necrotic tissue within the wound bed including Eschar and Adherent Slough. Assessment Active Problems ICD-10 Type 2 diabetes mellitus with foot ulcer Disruption of external operation (surgical) wound, not elsewhere classified, initial encounter Non-pressure chronic ulcer of other part of left foot with fat layer exposed End stage renal disease Dependence on renal dialysis Essential (primary) hypertension Plan Wound Cleansing: Wound #4 Left Amputation Site - Toe: Clean wound with Normal Saline. Anesthetic (add to Medication List): Wound #4 Left Amputation Site - Toe: Topical Lidocaine 4% cream applied to wound bed prior to debridement (In Clinic Only). Primary Wound Dressing: Wound #4 Left Amputation Site - Toe: Other: - Vashe, apply to gauze, squeeze out excess apply to wound Secondary Dressing: Wound #4 Left Amputation Site - Toe: ABD pad - rolled gauze to secure Dressing Change Frequency: Wound #4 Left Amputation Site - Toe: Change dressing every day. Follow-up Appointments: Wound #4 Left Amputation Site - Toe: Return Appointment in 1 week. Off-Loading: Open toe surgical shoe with peg assist. General Notes: Appointment with Dr. Luana [name] today [name], [name] ([id number]) 1.  Surgical wound left first ray amputation. I did not alter the current dressing. He is going over to see Dr. Luana [name] for wound debridement later this morning 2. I think this is a limb threatening situation. He definitely needs to go back to see vascular Dr. [name] for consideration of an additional angiogram. He had an angioplasty of the anterior tibial vessel although these things can reocclude. 3. I will contact Dr. Luana [name] to see about a bone specimen for pathology and culture 4. He was sent for an infectious disease consult but this has not been arranged. He is completed his ciprofloxacin which was appropriate for what was cultured although I do not have this in front of me at the moment of this  dictation. Rather than proceed with additional antibiotics I think he probably needs a bone specimen obtained off antibiotics to see what the underlying culture and pathology is 5. I think we need to move urgently on the situation for limb salvage. First step will be a repeat review by Dr. [name]. I do not think it is wrong to move ahead with bone for pathology and culture. If this is positive he would be a candidate for hyperbaric oxygen therapy as an urgent ancillary treatment at limb salvage. Electronic Signature(s) Signed: 06/28/2019 5:54:02 PM By: Linton Ham MD Entered By: Linton Ham on 06/28/2019 10:35:16 [name], [name] ([id number]) -------------------------------------------------------------------------------- SuperBill Details Patient Name: [name] Date of Service: 06/28/2019 Medical Record Number: [id number] Patient Account Number: [id number] Date of Birth/Sex: 1961/06/24 (58 y.o. M) Treating RN: Scott [name] Primary Care Provider: Norville [name] Other Clinician: Referring Provider: [facility] Treating Provider/Extender: Tito Dine in Treatment: 2 Diagnosis Coding ICD-10 Codes Code Description E11.621 Type 2 diabetes mellitus with foot ulcer T81.31XA Disruption of external operation (surgical) wound, not elsewhere classified, initial encounter L97.522 Non-pressure chronic ulcer of other part of left foot with fat layer exposed N18.6 End stage renal disease Z99.2 Dependence on renal dialysis I10 Essential (primary) hypertension Facility Procedures CPT4 Code: 24268341 Description: 99213 - WOUND CARE VISIT-LEV 3 EST PT Modifier: Quantity: 1 Physician Procedures CPT4: Description Modifier Quantity Code 9622297 98921 - WC PHYS LEVEL 4 - EST PT 1 ICD-10 Diagnosis Description T81.31XA Disruption of external operation (surgical) wound, not elsewhere classified, initial encounter L97.522 Non-pressure chronic ulcer of  other part of left foot  with fat layer exposed Electronic Signature(s) Signed: 06/28/2019 5:54:02 PM By: Linton Ham MD Entered By: Linton Ham on 06/28/2019 10:36:15

## 2019-06-29 NOTE — Progress Notes (Signed)
[name], HOMAN ([id number]) Visit Report for 06/28/2019 Arrival Information Details Patient Name: Scott Scott [name] A. Date of Service: 06/28/2019 8:30 AM Medical Record Number: [id number] Patient Account Number: [id number] Date of Birth/Sex: 1960/12/01 (58 y.o. M) Treating RN: Army Melia Primary Care Everlina Gotts: [facility] Other Clinician: Referring Hikaru Delorenzo: [facility] Treating Norvil Martensen/Extender: Tito Dine in Treatment: 2 Visit Information History Since Last Visit Added or deleted any medications: No Patient Arrived: Ambulatory Any new allergies or adverse reactions: No Arrival Time: 08:55 Had a fall or experienced change in No Accompanied By: self activities of daily living that may affect Transfer Assistance: None risk of falls: Patient Identification Verified: Yes Signs or symptoms of abuse/neglect since last visito No Patient Has Alerts: Yes Hospitalized since last visit: No Patient Alerts: Patient on Blood Thinner Has Dressing in Place as Prescribed: Yes Effient Pain Present Now: No aspirin 81 DMII Electronic Signature(s) Signed: 06/28/2019 11:52:09 AM By: Army Melia Entered By: Army Melia on 06/28/2019 08:55:30 Scott [name] A. ([id number]) -------------------------------------------------------------------------------- Clinic Level of Care Assessment Details Patient Name: Scott [name] A. Date of Service: 06/28/2019 8:30 AM Medical Record Number: [id number] Patient Account Number: [id number] Date of Birth/Sex: 07-06-1961 (58 y.o. M) Treating RN: Cornell Barman Primary Care Moana Munford: Norville Haggard Other Clinician: Referring Vallarie Fei: [facility] Treating Dorrell Mitcheltree/Extender: Tito Dine in Treatment: 2 Clinic Level of Care Assessment Items TOOL 4 Quantity Score []  - Use when only an EandM is performed on FOLLOW-UP visit 0 ASSESSMENTS - Nursing Assessment / Reassessment []  - Reassessment of Co-morbidities (includes updates in  patient status) 0 X- 1 5 Reassessment of Adherence to Treatment Plan ASSESSMENTS - Wound and Skin Assessment / Reassessment X - Simple Wound Assessment / Reassessment - one wound 1 5 []  - 0 Complex Wound Assessment / Reassessment - multiple wounds []  - 0 Dermatologic / Skin Assessment (not related to wound area) ASSESSMENTS - Focused Assessment []  - Circumferential Edema Measurements - multi extremities 0 []  - 0 Nutritional Assessment / Counseling / Intervention []  - 0 Lower Extremity Assessment (monofilament, tuning fork, pulses) []  - 0 Peripheral Arterial Disease Assessment (using hand held doppler) ASSESSMENTS - Ostomy and/or Continence Assessment and Care []  - Incontinence Assessment and Management 0 []  - 0 Ostomy Care Assessment and Management (repouching, etc.) PROCESS - Coordination of Care X - Simple Patient / Family Education for ongoing care 1 15 []  - 0 Complex (extensive) Patient / Family Education for ongoing care X- 1 10 Staff obtains Programmer, systems, Records, Test Results / Process Orders []  - 0 Staff telephones HHA, Nursing Homes / Clarify orders / etc []  - 0 Routine Transfer to another Facility (non-emergent condition) []  - 0 Routine Hospital Admission (non-emergent condition) []  - 0 New Admissions / Biomedical engineer / Ordering NPWT, Apligraf, etc. []  - 0 Emergency Hospital Admission (emergent condition) X- 1 10 Simple Discharge Coordination Melroy, Scott [name] A. ([id number]) []  - 0 Complex (extensive) Discharge Coordination PROCESS - Special Needs []  - Pediatric / Minor Patient Management 0 []  - 0 Isolation Patient Management []  - 0 Hearing / Language / Visual special needs []  - 0 Assessment of Community assistance (transportation, D/C planning, etc.) []  - 0 Additional assistance / Altered mentation []  - 0 Support Surface(s) Assessment (bed, cushion, seat, etc.) INTERVENTIONS - Wound Cleansing / Measurement X - Simple Wound Cleansing - one wound 1  5 []  - 0 Complex Wound Cleansing - multiple wounds X- 1 5 Wound Imaging (photographs - any number of wounds) []  - 0 Wound Tracing (  instead of photographs) X- 1 5 Simple Wound Measurement - one wound []  - 0 Complex Wound Measurement - multiple wounds INTERVENTIONS - Wound Dressings []  - Small Wound Dressing one or multiple wounds 0 []  - 0 Medium Wound Dressing one or multiple wounds X- 1 20 Large Wound Dressing one or multiple wounds []  - 0 Application of Medications - topical []  - 0 Application of Medications - injection INTERVENTIONS - Miscellaneous []  - External ear exam 0 []  - 0 Specimen Collection (cultures, biopsies, blood, body fluids, etc.) []  - 0 Specimen(s) / Culture(s) sent or taken to Lab for analysis []  - 0 Patient Transfer (multiple staff / Civil Service fast streamer / Similar devices) []  - 0 Simple Staple / Suture removal (25 or less) []  - 0 Complex Staple / Suture removal (26 or more) []  - 0 Hypo / Hyperglycemic Management (close monitor of Blood Glucose) []  - 0 Ankle / Brachial Index (ABI) - do not check if billed separately X- 1 5 Vital Signs Scott [name] A. ([id number]) Has the patient been seen at the hospital within the last three years: Yes Total Score: 85 Level Of Care: New/Established - Level 3 Electronic Signature(s) Signed: 06/29/2019 10:04:30 AM By: Gretta Cool, BSN, RN, CWS, Kim RN, BSN Entered By: Gretta Cool, BSN, RN, CWS, Kim on 06/28/2019 09:30:33 Estabrooks, Asir A. ([id number]) -------------------------------------------------------------------------------- Encounter Discharge Information Details Patient Name: Scott [name] A. Date of Service: 06/28/2019 8:30 AM Medical Record Number: [id number] Patient Account Number: [id number] Date of Birth/Sex: Apr 14, 1961 (58 y.o. M) Treating RN: Cornell Barman Primary Care Makisha Marrin: Norville Haggard Other Clinician: Referring Jossilyn Benda: [facility] Treating Nicklos Gaxiola/Extender: Tito Dine in Treatment:  2 Encounter Discharge Information Items Discharge Condition: Stable Ambulatory Status: Ambulatory Discharge Destination: Home Transportation: Private Auto Accompanied By: wife Schedule Follow-up Appointment: Yes Clinical Summary of Care: Electronic Signature(s) Signed: 06/29/2019 10:04:30 AM By: Gretta Cool, BSN, RN, CWS, Kim RN, BSN Entered By: Gretta Cool, BSN, RN, CWS, Kim on 06/28/2019 09:36:38 Barros, Andri AMarland Kitchen ([id number]) -------------------------------------------------------------------------------- Lower Extremity Assessment Details Patient Name: Bomberger, Caidyn A. Date of Service: 06/28/2019 8:30 AM Medical Record Number: [id number] Patient Account Number: [id number] Date of Birth/Sex: 16-Aug-1961 (58 y.o. M) Treating RN: Army Melia Primary Care Mekala Winger: Norville Haggard Other Clinician: Referring Susana Gripp: [facility] Treating Iokepa Geffre/Extender: Ricard Dillon Weeks in Treatment: 2 Edema Assessment Assessed: [Left: No] [Right: No] Edema: [Left: N] [Right: o] Vascular Assessment Pulses: Dorsalis Pedis Palpable: [Left:Yes] Electronic Signature(s) Signed: 06/28/2019 11:52:09 AM By: Army Melia Entered By: Army Melia on 06/28/2019 09:03:15 Scott [name] A. ([id number]) -------------------------------------------------------------------------------- Multi Wound Chart Details Patient Name: Tressia Scott [name] A. Date of Service: 06/28/2019 8:30 AM Medical Record Number: [id number] Patient Account Number: [id number] Date of Birth/Sex: [birth year] (58 y.o. M) Treating RN: Cornell Barman Primary Care Braydan Marriott: Norville Haggard Other Clinician: Referring Oziah Vitanza: [facility] Treating Anora Schwenke/Extender: Tito Dine in Treatment: 2 Vital Signs Height(in): 69 Pulse(bpm): 80 Weight(lbs): 215 Blood Pressure(mmHg): 132/42 Body Mass Index(BMI): 32 Temperature(F): 97.6 Respiratory Rate 16 (breaths/min): Photos: [N/A:N/A] Wound Location: Left Amputation Site - Toe N/A  N/A Wounding Event: Surgical Injury N/A N/A Primary Etiology: Open Surgical Wound N/A N/A Secondary Etiology: Diabetic Wound/Ulcer of the N/A N/A Lower Extremity Comorbid History: Congestive Heart Failure, N/A N/A Coronary Artery Disease, Hypertension, Peripheral Venous Disease, Type II Diabetes, End Stage Renal Disease, Osteoarthritis, Neuropathy, Confinement Anxiety Date Acquired: 05/24/2019 N/A N/A Weeks of Treatment: 2 N/A N/A Wound Status: Open N/A N/A Pending Amputation on Yes N/A N/A Presentation: Measurements L x W x D 6.2x3.2x1.1 N/A N/A (  cm) Area (cm) : 15.582 N/A N/A Volume (cm) : 17.141 N/A N/A % Reduction in Area: 5.50% N/A N/A % Reduction in Volume: -939.50% N/A N/A Classification: Partial Thickness N/A N/A Exudate Amount: Medium N/A N/A Exudate Type: Serous N/A N/A Exudate Color: amber N/A N/A Wound Margin: Flat and Intact N/A N/A Gruber, Quadir A. ([id number]) Granulation Amount: Small (1-33%) N/A N/A Necrotic Amount: Large (67-100%) N/A N/A Necrotic Tissue: Eschar, Adherent Slough N/A N/A Exposed Structures: Fat Layer (Subcutaneous N/A N/A Tissue) Exposed: Yes Fascia: No Tendon: No Muscle: No Joint: No Bone: No Epithelialization: None N/A N/A Treatment Notes Wound #4 (Left Amputation Site - Toe) Notes Dakins, dry gauze, ABD, Conform Electronic Signature(s) Signed: 06/28/2019 5:54:02 PM By: Linton Ham MD Entered By: Linton Ham on 06/28/2019 10:19:54 Scott [name] A. ([id number]) -------------------------------------------------------------------------------- [place] Details Patient Name: Scott Mount A. Date of Service: 06/28/2019 8:30 AM Medical Record Number: [id number] Patient Account Number: [id number] Date of Birth/Sex: 1960/11/13 (58 y.o. M) Treating RN: Cornell Barman Primary Care Derren Suydam: Norville Haggard Other Clinician: Referring Javian Nudd: [facility] Treating Kadajah Kjos/Extender: Tito Dine  in Treatment: 2 Active Inactive Necrotic Tissue Nursing Diagnoses: Impaired tissue integrity related to necrotic/devitalized tissue Goals: Necrotic/devitalized tissue will be minimized in the wound bed Date Initiated: 06/12/2019 Target Resolution Date: 07/13/2019 Goal Status: Active Patient/caregiver will verbalize understanding of reason and process for debridement of necrotic tissue Date Initiated: 06/12/2019 Target Resolution Date: 06/12/2019 Goal Status: Active Interventions: Assess patient pain level pre-, during and post procedure and prior to discharge Provide education on necrotic tissue and debridement process Treatment Activities: Apply topical anesthetic as ordered : 06/12/2019 Notes: Wound/Skin Impairment Nursing Diagnoses: Impaired tissue integrity Knowledge deficit related to ulceration/compromised skin integrity Goals: Ulcer/skin breakdown will have a volume reduction of 30% by week 4 Date Initiated: 06/12/2019 Target Resolution Date: 07/13/2019 Goal Status: Active Interventions: Assess patient/caregiver ability to obtain necessary supplies Assess patient/caregiver ability to perform ulcer/skin care regimen upon admission and as needed Assess ulceration(s) every visit Notes: Electronic Signature(s) JUSTINO, BOZE ([id number]) Signed: 06/29/2019 10:04:30 AM By: Gretta Cool, BSN, RN, CWS, Kim RN, BSN Entered By: Gretta Cool, BSN, RN, CWS, Kim on 06/28/2019 09:28:45 Paquette, Renaldo A. ([id number]) -------------------------------------------------------------------------------- Pain Assessment Details Patient Name: Scott [name] A. Date of Service: 06/28/2019 8:30 AM Medical Record Number: [id number] Patient Account Number: [id number] Date of Birth/Sex: 1961/05/24 (58 y.o. M) Treating RN: Army Melia Primary Care Gracie Gupta: Norville Haggard Other Clinician: Referring Flecia Shutter: [facility] Treating Tearra Ouk/Extender: Ricard Dillon Weeks in Treatment: 2 Active  Problems Location of Pain Severity and Description of Pain Patient Has Paino No Site Locations Pain Management and Medication Current Pain Management: Electronic Signature(s) Signed: 06/28/2019 11:52:09 AM By: Army Melia Entered By: Army Melia on 06/28/2019 08:55:38 Heroux, Arhum A. ([id number]) -------------------------------------------------------------------------------- Patient/Caregiver Education Details Patient Name: Scott Mount A. Date of Service: 06/28/2019 8:30 AM Medical Record Number: [id number] Patient Account Number: [id number] Date of Birth/Gender: March 22, 1961 (58 y.o. M) Treating RN: Cornell Barman Primary Care Physician: Norville Haggard Other Clinician: Referring Physician: Norville Haggard Treating Physician/Extender: Tito Dine in Treatment: 2 Education Assessment Education Provided To: Patient Education Topics Provided Welcome To [facility]: Handouts: Welcome To [facility] Methods: Demonstration Responses: State content correctly Wound/Skin Impairment: Handouts: Caring for Your Ulcer Methods: Demonstration, Explain/Verbal Responses: Return demonstration correctly Electronic Signature(s) Signed: 06/29/2019 10:04:30 AM By: Gretta Cool, BSN, RN, CWS, Kim RN, BSN Entered By: Gretta Cool, BSN, RN, CWS, Kim on 06/28/2019 09:31:07 Scott [name] A. ([id number]) -------------------------------------------------------------------------------- Wound  Assessment Details Patient Name: Scott [name] A. Date of Service: 06/28/2019 8:30 AM Medical Record Number: [id number] Patient Account Number: [id number] Date of Birth/Sex: [birth year] (58 y.o. M) Treating RN: Army Melia Primary Care Simmie Garin: Norville Haggard Other Clinician: Referring Tylan Kinn: [facility] Treating Doniel Maiello/Extender: Ricard Dillon Weeks in Treatment: 2 Wound Status Wound Number: 4 Primary Open Surgical Wound Etiology: Wound Location: Left Amputation Site -  Toe Secondary Diabetic Wound/Ulcer of the Lower Extremity Wounding Event: Surgical Injury Etiology: Date Acquired: 05/24/2019 Wound Open Weeks Of Treatment: 2 Status: Clustered Wound: No Comorbid Congestive Heart Failure, Coronary Artery Pending Amputation On Presentation History: Disease, Hypertension, Peripheral Venous Disease, Type II Diabetes, End Stage Renal Disease, Osteoarthritis, Neuropathy, Confinement Anxiety Photos Wound Measurements Length: (cm) 6.2 Width: (cm) 3.2 Depth: (cm) 1.1 Area: (cm) 15.582 Volume: (cm) 17.141 % Reduction in Area: 5.5% % Reduction in Volume: -939.5% Epithelialization: None Tunneling: No Undermining: No Wound Description Classification: Partial Thickness Foul Odor Wound Margin: Flat and Intact Slough/Fib Exudate Amount: Medium Exudate Type: Serous Exudate Color: amber After Cleansing: No rino Yes Wound Bed Granulation Amount: Small (1-33%) Exposed Structure Necrotic Amount: Large (67-100%) Fascia Exposed: No Necrotic Quality: Eschar, Adherent Slough Fat Layer (Subcutaneous Tissue) Exposed: Yes Tendon Exposed: No Muscle Exposed: No Joint Exposed: No Bhalla, Jaqualin A. ([id number]) Bone Exposed: No Treatment Notes Wound #4 (Left Amputation Site - Toe) Notes Dakins, dry gauze, ABD, Conform Electronic Signature(s) Signed: 06/28/2019 11:52:09 AM By: Army Melia Entered By: Army Melia on 06/28/2019 09:02:50 Dlouhy, Taz A. ([id number]) -------------------------------------------------------------------------------- Vitals Details Patient Name: Tressia Scott [name] A. Date of Service: 06/28/2019 8:30 AM Medical Record Number: [id number] Patient Account Number: [id number] Date of Birth/Sex: [birth year] (58 y.o. M) Treating RN: Army Melia Primary Care Aftyn Nott: Norville Haggard Other Clinician: Referring Cordarrell Sane: [facility] Treating Sidnie Swalley/Extender: Tito Dine in Treatment: 2 Vital Signs Time Taken:  08:55 Temperature (F): 97.6 Height (in): 69 Pulse (bpm): 80 Weight (lbs): 215 Respiratory Rate (breaths/min): 16 Body Mass Index (BMI): 31.7 Blood Pressure (mmHg): 132/42 Reference Range: 80 - 120 mg / dl Electronic Signature(s) Signed: 06/28/2019 11:52:09 AM By: Army Melia Entered By: Army Melia on 06/28/2019 08:56:57

## 2019-07-03 ENCOUNTER — Encounter (INDEPENDENT_AMBULATORY_CARE_PROVIDER_SITE_OTHER): Payer: Self-pay | Admitting: Vascular Surgery

## 2019-07-03 ENCOUNTER — Other Ambulatory Visit (INDEPENDENT_AMBULATORY_CARE_PROVIDER_SITE_OTHER): Payer: Self-pay | Admitting: Vascular Surgery

## 2019-07-03 ENCOUNTER — Encounter (INDEPENDENT_AMBULATORY_CARE_PROVIDER_SITE_OTHER): Payer: Self-pay

## 2019-07-03 ENCOUNTER — Ambulatory Visit (INDEPENDENT_AMBULATORY_CARE_PROVIDER_SITE_OTHER): Payer: Medicare Other

## 2019-07-03 ENCOUNTER — Ambulatory Visit (INDEPENDENT_AMBULATORY_CARE_PROVIDER_SITE_OTHER): Payer: Medicare Other | Admitting: Vascular Surgery

## 2019-07-03 ENCOUNTER — Other Ambulatory Visit: Payer: Self-pay

## 2019-07-03 ENCOUNTER — Encounter: Payer: Medicare Other | Attending: Physician Assistant | Admitting: Physician Assistant

## 2019-07-03 VITALS — BP 137/74 | HR 74 | Resp 16 | Wt 209.4 lb

## 2019-07-03 DIAGNOSIS — F17200 Nicotine dependence, unspecified, uncomplicated: Secondary | ICD-10-CM | POA: Insufficient documentation

## 2019-07-03 DIAGNOSIS — I739 Peripheral vascular disease, unspecified: Secondary | ICD-10-CM

## 2019-07-03 DIAGNOSIS — E1122 Type 2 diabetes mellitus with diabetic chronic kidney disease: Secondary | ICD-10-CM | POA: Diagnosis not present

## 2019-07-03 DIAGNOSIS — L97909 Non-pressure chronic ulcer of unspecified part of unspecified lower leg with unspecified severity: Secondary | ICD-10-CM | POA: Insufficient documentation

## 2019-07-03 DIAGNOSIS — J45998 Other asthma: Secondary | ICD-10-CM

## 2019-07-03 DIAGNOSIS — Z89422 Acquired absence of other left toe(s): Secondary | ICD-10-CM | POA: Diagnosis not present

## 2019-07-03 DIAGNOSIS — L97526 Non-pressure chronic ulcer of other part of left foot with bone involvement without evidence of necrosis: Secondary | ICD-10-CM | POA: Insufficient documentation

## 2019-07-03 DIAGNOSIS — I70299 Other atherosclerosis of native arteries of extremities, unspecified extremity: Secondary | ICD-10-CM | POA: Insufficient documentation

## 2019-07-03 DIAGNOSIS — I2510 Atherosclerotic heart disease of native coronary artery without angina pectoris: Secondary | ICD-10-CM

## 2019-07-03 DIAGNOSIS — I132 Hypertensive heart and chronic kidney disease with heart failure and with stage 5 chronic kidney disease, or end stage renal disease: Secondary | ICD-10-CM | POA: Insufficient documentation

## 2019-07-03 DIAGNOSIS — N186 End stage renal disease: Secondary | ICD-10-CM | POA: Insufficient documentation

## 2019-07-03 DIAGNOSIS — E11621 Type 2 diabetes mellitus with foot ulcer: Secondary | ICD-10-CM | POA: Diagnosis present

## 2019-07-03 DIAGNOSIS — Z992 Dependence on renal dialysis: Secondary | ICD-10-CM | POA: Insufficient documentation

## 2019-07-03 DIAGNOSIS — I7025 Atherosclerosis of native arteries of other extremities with ulceration: Secondary | ICD-10-CM

## 2019-07-03 DIAGNOSIS — I509 Heart failure, unspecified: Secondary | ICD-10-CM | POA: Insufficient documentation

## 2019-07-03 DIAGNOSIS — K219 Gastro-esophageal reflux disease without esophagitis: Secondary | ICD-10-CM | POA: Diagnosis not present

## 2019-07-03 DIAGNOSIS — E1142 Type 2 diabetes mellitus with diabetic polyneuropathy: Secondary | ICD-10-CM | POA: Diagnosis not present

## 2019-07-03 DIAGNOSIS — E1129 Type 2 diabetes mellitus with other diabetic kidney complication: Secondary | ICD-10-CM

## 2019-07-03 NOTE — Progress Notes (Signed)
MRN : [id number]  Scott [name] is a 58 y.o. (May 06, 1961) male who presents with chief complaint of  Chief Complaint  Patient presents with   Follow-up    ultrasound follow up  .  History of Present Illness:   The patient returns to the office for followup and review of the noninvasive studies. There has been a significant deterioration in the lower extremity symptoms.  The patient notes interval shortening of their claudication distance and development of mild rest pain symptoms. No new ulcers or wounds have occurred since the last visit but his current ulcer is not healing at all.  There have been no significant changes to the patient's overall health care.  The patient denies amaurosis fugax or recent TIA symptoms. There are no recent neurological changes noted. The patient denies history of DVT, PE or superficial thrombophlebitis. The patient denies recent episodes of angina or shortness of breath.   ABI's Rt=[place] and Lt=not obtainable Duplex US of the lower extremity arterial system shows monophasic signals in the SFA and tibials  Current Meds  Medication Sig   albuterol (PROVENTIL HFA;VENTOLIN HFA) 108 (90 BASE) MCG/ACT inhaler Inhale 2 puffs into the lungs every 6 (six) hours as needed for wheezing or shortness of breath.   aspirin EC 81 MG tablet Take 1 tablet (81 mg total) by mouth daily.   atorvastatin (LIPITOR) 40 MG tablet Take 40 mg by mouth daily.   B Complex-C-Folic Acid (DIALYVITE 983) 0.8 MG TABS Take 1 tablet by mouth daily.   calcitRIOL (ROCALTROL) 0.25 MCG capsule Take 0.25 mcg by mouth daily.   calcium acetate (PHOSLO) 667 MG capsule Take 2,001 mg by mouth 3 (three) times daily with meals. Takes 1-2 caps if he has snacks   carvedilol (COREG) 25 MG tablet Take 1 tablet (25 mg total) by mouth 2 (two) times daily with a meal.   Cholecalciferol (VITAMIN D) 2000 units tablet Take 2,000 Units by mouth daily. Unsure how many units   clopidogrel (PLAVIX) 75  MG tablet Take 1 tablet (75 mg total) by mouth daily with breakfast.   furosemide (LASIX) 80 MG tablet Take 80 mg by mouth daily.   gentamicin cream (GARAMYCIN) 0.1 % Apply 1 application topically daily.   HYDROcodone-acetaminophen (NORCO/VICODIN) 5-325 MG tablet Take 1 tablet by mouth every 4 (four) hours as needed.   insulin aspart protamine- aspart (NOVOLOG MIX 70/30) (70-30) 100 UNIT/ML injection Inject 0.25 mLs (25 Units total) into the skin 2 (two) times daily with a meal. (Patient taking differently: Inject 20-25 Units into the skin 2 (two) times daily with a meal. )   isosorbide mononitrate (IMDUR) 30 MG 24 hr tablet Take 1 tablet (30 mg total) by mouth daily.   nitroGLYCERIN (NITROSTAT) 0.4 MG SL tablet Place 0.4 mg under the tongue every 5 (five) minutes as needed for chest pain. Pt needs new Rx. Bottle has expried   omeprazole (PRILOSEC) 20 MG capsule Take 1 capsule (20 mg total) by mouth daily.   ONE TOUCH ULTRA TEST test strip    tamsulosin (FLOMAX) 0.4 MG CAPS capsule Take 1 capsule (0.4 mg total) by mouth daily.    Past Medical History:  Diagnosis Date   Arthritis    "left arm; right leg" (12/13/2014)   Asthma    CHF (congestive heart failure) (HCC)    Chronic disease anemia    /notes 12/13/2014   Chronic kidney disease (CKD), stage IV (severe) ([place])    [name] 12/13/2014... on dialysis  Complication of anesthesia    unable to urinate after CAPD urgery   Continuous ambulatory peritoneal dialysis status [facility])    Coronary artery disease    /notes 12/13/2014   Depression    Dysrhythmia    patient unaware of irregular heartbeat   GERD (gastroesophageal reflux disease)    High cholesterol    [name] 12/13/2014   Hypertension    Non-Q wave myocardial infarction [facility])    [name] 12/13/2014   PVD (peripheral vascular disease) ([place])    [name] 12/13/2014   Type II diabetes mellitus ([place])     Past Surgical History:  Procedure Laterality Date    AMPUTATION TOE Left 05/24/2019   Procedure: Left great toe amputation;  Surgeon: Caroline More, DPM;  Location: [facility] ORS;  Service: Podiatry;  Laterality: Left;   AV FISTULA PLACEMENT Right 04/07/2017   Procedure: ARTERIOVENOUS (AV) FISTULA CREATION ( BRACHIALCEPHALIC );  Surgeon: Katha Cabal, MD;  Location: [facility] ORS;  Service: Vascular;  Laterality: Right;   CAPD INSERTION N/A 04/07/2017   Procedure: LAPAROSCOPIC INSERTION CONTINUOUS AMBULATORY PERITONEAL DIALYSIS  (CAPD) CATHETER;  Surgeon: Katha Cabal, MD;  Location: [facility] ORS;  Service: Vascular;  Laterality: N/A;   CARDIAC CATHETERIZATION  11/2014   COLONOSCOPY WITH PROPOFOL N/A 12/01/2017   Procedure: COLONOSCOPY WITH PROPOFOL;  Surgeon: Lin Landsman, MD;  Location: [place];  Service: Endoscopy;  Laterality: N/A;  Diabetic - insulin   COLONOSCOPY WITH PROPOFOL N/A 12/29/2017   Procedure: COLONOSCOPY WITH PROPOFOL;  Surgeon: Lin Landsman, MD;  Location: [place];  Service: Endoscopy;  Laterality: N/A;   DIALYSIS/PERMA CATHETER INSERTION N/A 01/18/2017   Procedure: Dialysis/Perma Catheter Insertion;  Surgeon: Algernon Huxley, MD;  Location: [place] [facility];  Service: Cardiovascular;  Laterality: N/A;   DIALYSIS/PERMA CATHETER REMOVAL N/A 07/26/2017   Procedure: DIALYSIS/PERMA CATHETER REMOVAL;  Surgeon: Algernon Huxley, MD;  Location: [place] [facility];  Service: Cardiovascular;  Laterality: N/A;   INCISION AND DRAINAGE OF WOUND Right ~ 10/2014   "4th toe foot"   LOWER EXTREMITY ANGIOGRAPHY Left 05/23/2019   Procedure: Lower Extremity Angiography;  Surgeon: Katha Cabal, MD;  Location: [place] [facility];  Service: Cardiovascular;  Laterality: Left;   PERCUTANEOUS CORONARY STENT INTERVENTION (PCI-S) N/A 12/14/2014   Procedure: PERCUTANEOUS CORONARY STENT INTERVENTION (PCI-S);  Surgeon: Charolette Forward, MD;  Location: [facility] CATH LAB;  Service: Cardiovascular;  Laterality: N/A;   PERCUTANEOUS  CORONARY STENT INTERVENTION (PCI-S) N/A 12/18/2014   Procedure: PERCUTANEOUS CORONARY STENT INTERVENTION (PCI-S);  Surgeon: Charolette Forward, MD;  Location: [facility] CATH LAB;  Service: Cardiovascular;  Laterality: N/A;   POLYPECTOMY  12/01/2017   Procedure: POLYPECTOMY;  Surgeon: Lin Landsman, MD;  Location: [place];  Service: Endoscopy;;   POLYPECTOMY  12/29/2017   Procedure: POLYPECTOMY;  Surgeon: Lin Landsman, MD;  Location: [place];  Service: Endoscopy;;   TOE AMPUTATION Right 2015   4th toe    Social History Social History   Tobacco Use   Smoking status: Former Smoker    Packs/day: 1.00    Years: 30.00    Pack years: 30.00    Types: Cigarettes    Start date: 08/31/1984    Quit date: 11/30/2014    Years since quitting: 4.5   Smokeless tobacco: Never Used  Substance Use Topics   Alcohol use: Yes    Comment: Rarely, twice a year.   Drug use: No    Family History Family History  Problem Relation  Age of Onset   Cancer Mother        Lung   Cancer Father        Lung   Diabetes Sister    Diabetes Brother    CAD Brother    Hernia Son    Cancer Brother     No Known Allergies   REVIEW OF SYSTEMS (Negative unless checked)  Constitutional: [] Weight loss  [] Fever  [] Chills Cardiac: [] Chest pain   [] Chest pressure   [] Palpitations   [] Shortness of breath when laying flat   [] Shortness of breath with exertion. Vascular:  [x] Pain in legs with walking   [x] Pain in legs at rest  [] History of DVT   [] Phlebitis   [] Swelling in legs   [] Varicose veins   [x] Non-healing ulcers Pulmonary:   [] Uses home oxygen   [] Productive cough   [] Hemoptysis   [] Wheeze  [] COPD   [] Asthma Neurologic:  [] Dizziness   [] Seizures   [] History of stroke   [] History of TIA  [] Aphasia   [] Vissual changes   [] Weakness or numbness in arm   [] Weakness or numbness in leg Musculoskeletal:   [] Joint swelling   [] Joint pain   [] Low back pain Hematologic:  [] Easy bruising   [] Easy bleeding   [] Hypercoagulable state   [] Anemic Gastrointestinal:  [] Diarrhea   [] Vomiting  [] Gastroesophageal reflux/heartburn   [] Difficulty swallowing. Genitourinary:  [] Chronic kidney disease   [] Difficult urination  [] Frequent urination   [] Blood in urine Skin:  [] Rashes   [] Ulcers  Psychological:  [] History of anxiety   []  History of major depression.  Physical Examination  Vitals:   07/03/19 1432  BP: 137/74  Pulse: 74  Resp: 16  Weight: 209 lb 6.4 oz (95 kg)   Body mass index is 30.92 kg/m. Gen: WD/WN, NAD Head: [place]/AT, No temporalis wasting.  Ear/Nose/Throat: Hearing grossly intact, nares w/o erythema or drainage Eyes: PER, EOMI, sclera nonicteric.  Neck: Supple, no large masses.   Pulmonary:  Good air movement, no audible wheezing bilaterally, no use of accessory muscles.  Cardiac: RRR, no JVD Vascular:  Ulcer left foot uninfected Vessel Right Left  PT Not Palpable Not Palpable  DP Not Palpable Not Palpable  Gastrointestinal: Non-distended. No guarding/no peritoneal signs.  Musculoskeletal: M/S 5/5 throughout.  No deformity or atrophy.  Neurologic: CN 2-12 intact. Symmetrical.  Speech is fluent. Motor exam as listed above. Psychiatric: Judgment intact, Mood & affect appropriate for pt's clinical situation. Dermatologic: No rashes or ulcers noted.  No changes consistent with cellulitis. Lymph : No lichenification or skin changes of chronic lymphedema.  CBC Lab Results  Component Value Date   WBC 9.9 06/12/2019   HGB 9.1 (L) 06/12/2019   HCT 28.4 (L) 06/12/2019   MCV 97.3 06/12/2019   PLT 283 06/12/2019    BMET    Component Value Date/Time   NA 136 06/12/2019 1508   NA 140 04/16/2015   NA 140 12/13/2014 0102   K 4.2 06/12/2019 1508   K 4.6 12/13/2014 0102   CL 101 06/12/2019 1508   CL 111 12/13/2014 0102   CO2 22 06/12/2019 1508   CO2 23 12/13/2014 0102   GLUCOSE 128 (H) 06/12/2019 1508   GLUCOSE 213 (H) 12/13/2014 0102   BUN 64 (H) 06/12/2019  1508   BUN 44 (A) 04/16/2015   BUN 25 (H) 12/13/2014 0102   CREATININE 10.18 (H) 06/12/2019 1508   CREATININE 1.95 (H) 12/13/2014 0102   CALCIUM 7.5 (L) 06/12/2019 1508   CALCIUM 8.0 (L) 12/13/2014 0102  GFRNONAA 5 (L) 06/12/2019 1508   GFRNONAA 37 (L) 12/13/2014 0102   GFRAA 6 (L) 06/12/2019 1508   GFRAA 43 (L) 12/13/2014 0102   CrCl cannot be calculated (Patient's most recent lab result is older than the maximum 21 days allowed.).  COAG Lab Results  Component Value Date   INR 0.99 03/25/2017   INR 1.08 01/10/2015   INR 1.10 12/14/2014    Radiology Vas [place] Burn[facility] Bunting With/wo Tbi  Result Date: 06/08/2019 LOWER EXTREMITY DOPPLER STUDY High Risk Factors: Hypertension, Diabetes.  Vascular Interventions: 05/23/2019 PTA of Lt ATA and dorsalis pedis artery. PTA. Performing Technologist: Charlane Ferretti RT (R)(VS)  Examination Guidelines: A complete evaluation includes at minimum, Doppler waveform signals and systolic blood pressure reading at the level of bilateral brachial, anterior tibial, and posterior tibial arteries, when vessel segments are accessible. Bilateral testing is considered an integral part of a complete examination. Photoelectric Plethysmograph (PPG) waveforms and toe systolic pressure readings are included as required and additional duplex testing as needed. Limited examinations for reoccurring indications may be performed as noted.  ABI Findings: +---------+------------------+-----+--------+----------------+  Right     Rt Pressure (mmHg) Index Waveform Comment           +---------+------------------+-----+--------+----------------+  Brachial                                    Port in arm       +---------+------------------+-----+--------+----------------+  ATA       75                                                  +---------+------------------+-----+--------+----------------+  PTA                                         Non compressable   +---------+------------------+-----+--------+----------------+  Great Toe 109                      Abnormal                   +---------+------------------+-----+--------+----------------+ +---------+------------------+-----+--------+----------------+  Left      Lt Pressure (mmHg) Index Waveform Comment           +---------+------------------+-----+--------+----------------+  ATA                                         176               +---------+------------------+-----+--------+----------------+  PTA                                         Non compressable  +---------+------------------+-----+--------+----------------+  Great Toe                          Abnormal 2nd toe           +---------+------------------+-----+--------+----------------+  Summary: Right: Resting right ankle-brachial index indicates noncompressible right lower extremity arteries. The right toe-brachial index is normal. ABIs are  unreliable. Although ankle brachial indices are within normal limits (0.95-1.29), arterial Doppler waveforms at the ankle suggest some component of arterial occlusive disease. Left: Resting left ankle-brachial index indicates noncompressible left lower extremity arteries. The left toe-brachial index is abnormal. ABIs are unreliable.  *See table(s) above for measurements and observations.  Electronically signed by Hortencia Pilar MD on 06/08/2019 at 4:42:42 PM.   Final    Ct Renal Stone Study  Result Date: 06/12/2019 CLINICAL DATA:  Right flank pain.  On dialysis. EXAM: CT ABDOMEN AND PELVIS WITHOUT CONTRAST TECHNIQUE: Multidetector CT imaging of the abdomen and pelvis was performed following the standard protocol without IV contrast. COMPARISON:  07/11/2017 FINDINGS: Lower chest: Cardiomegaly. Coronary artery calcifications. Visualized lung bases clear. Hepatobiliary: Gallbladder is mildly dilated with a small amount biliary sludge and small stones. Liver is unremarkable without discrete lesion. Pancreas:  Unremarkable. No pancreatic ductal dilatation or surrounding inflammatory changes. Spleen: Normal in size without focal abnormality. Adrenals/Urinary Tract: Adrenal glands unremarkable. Mild-to-moderate right-sided hydronephrosis. There is mild right perinephric and periureteral stranding. Extensive renal vascular calcifications. No renal or ureteral calculus is identified. No left-sided hydronephrosis. Left ureter unremarkable. Thin amount of fat deposition within the wall of the bladder, slightly more prominent compared to the prior exam. Mild diffuse urinary bladder wall thickening. Stomach/Bowel: Stomach is within normal limits. Appendix appears normal. Scattered colonic diverticulosis. No evidence of bowel wall thickening, distention, or inflammatory changes. Vascular/Lymphatic: Extensive atherosclerotic calcifications of the aortoiliac axis and throughout all visualized abdominopelvic vasculature. No abdominopelvic lymphadenopathy. Reproductive: Mildly prominent prostate gland. Other: Peritoneal dialysis catheter with tip coiled within the pelvis. Moderate amount of ascites. No abdominal wall hernia. Musculoskeletal: No acute or significant osseous findings. IMPRESSION: 1. Mild-to-moderate right-sided hydronephrosis without discrete renal or ureteral stone. 2. Mild circumferential urinary bladder wall thickening, which could represent cystitis. Correlate with urinalysis. 3. Cholelithiasis and biliary sludge noted within the gallbladder. 4. Peritoneal dialysis catheter with moderate ascites. 5. Mildly enlarged prostate gland. 6. Extensive atherosclerosis. Electronically Signed   By: Davina Poke M.D.   On: 06/12/2019 16:10      Assessment/Plan 1. Atherosclerosis of native arteries of the extremities with ulceration ([place])  Recommend:  The patient has evidence of severe atherosclerotic changes of both lower extremities associated with ulceration and tissue loss of the left foot.  This represents a  limb threatening ischemia and places the patient at the risk for left limb loss.  Patient should undergo angiography of the left lower extremity with the hope for intervention for limb salvage.  The risks and benefits as well as the alternative therapies was discussed in detail with the patient.  All questions were answered.  Patient agrees to proceed with left leg angiography.  The patient will follow up with me in the office after the procedure.    2. Atherosclerosis of native coronary artery of native heart without angina pectoris Continue cardiac and antihypertensive medications as already ordered and reviewed, no changes at this time.  Continue statin as ordered and reviewed, no changes at this time  Nitrates PRN for chest pain   3. Other asthma Continue pulmonary medications and aerosols as already ordered, these medications have been reviewed and there are no changes at this time.    4. Gastroesophageal reflux disease, unspecified whether esophagitis present Continue PPI as already ordered, this medication has been reviewed and there are no changes at this time.  Avoidence of caffeine and alcohol  Moderate elevation of the head of the bed   5. Type  2 diabetes mellitus with other diabetic kidney complication (HCC) Continue hypoglycemic medications as already ordered, these medications have been reviewed and there are no changes at this time.  Hgb A1C to be monitored as already arranged by primary service   6. ESRD (end stage renal disease) ([place]) At the present time the patient has adequate dialysis access.  Continue hemodialysis as ordered without interruption.  Avoid nephrotoxic medications and dehydration.  Further plans per nephrology    Hortencia Pilar, MD  07/03/2019 4:20 PM

## 2019-07-03 NOTE — Progress Notes (Addendum)
Scott [name], Scott [name] ([id number]) Visit Report for 07/03/2019 Chief Complaint Document Details Patient Name: Reppert, Denario A. Date of Service: 07/03/2019 10:45 AM Medical Record Number: [id number] Patient Account Number: [id number] Date of Birth/Sex: May 05, 1961 (58 y.o. M) Treating RN: Harold Barban Primary Care Provider: Norville Haggard Other Clinician: Referring Provider: Norville Haggard Treating Provider/Extender: STONE III, HOYT Weeks in Treatment: 3 Information Obtained from: Patient Chief Complaint Chronic right forefoot ulcer. Arterial insufficiency. New onset cellulitis and abscess. Electronic Signature(s) Signed: 07/03/2019 11:07:58 AM By: Worthy Keeler PA-C Entered By: Worthy Keeler on 07/03/2019 11:07:56 Scott [name], [name] ([id number]) -------------------------------------------------------------------------------- HPI Details Patient Name: Scott Mount A. Date of Service: 07/03/2019 10:45 AM Medical Record Number: [id number] Patient Account Number: [id number] Date of Birth/Sex: March 26, 1961 (58 y.o. M) Treating RN: Harold Barban Primary Care Provider: Norville Haggard Other Clinician: Referring Provider: [facility] Treating Provider/Extender: STONE III, HOYT Weeks in Treatment: 3 History of Present Illness HPI Description: The patient is a pleasant 58 year old with a past medical history significant for type 2 diabetes (hemoglobin A1c 13.2 in June 2015), peripheral neuropathy, PVD, and smoking. 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 in Nov 2014 as well as RLE angioplasty by [name]. He says that the surgical site never healed. He reports moderate pain at the site of his ulceration with pressure. He also describes symptoms consistent with claudication. No ischemic rest pain. ABI on the right is 0.99. Monophasic DP. Xray 08/01/2014 showed no definite osteomyelitis, MRI 08/17/2014 negative for  osteomyelitis. Biopsy cultures from 08/01/2014 grew Myroides species, sensitive only to imipenen. Completed 2 week course of doxycycline. Sensitivity to doxy requested but not performed. TCOM 32mmHg. Scheduled to see [name] and [name] in f/u. We have discussed hyperbaric oxygen therapy for his nonhealing Wagner grade 4 ulceration as well as arterial insufficiency but wishes to hold off until he obtains Medicaid coverage, which he is pursuing. He is offloading with a Darco shoe. Has declined total contact casting. Last week he presented with a small area of cellulitis on the dorsum of his right forefoot. Cultures grew methicillin sensitive staph aureus, sensitive to tigecycline. He was started on doxycycline. He returns to clinic for follow-up today and reports worsening redness, swelling, pain, and drainage. He denies any fevers. Readmission: Patient presents today for initial evaluation here in our clinic as a referral from [name] secondary to issues that he has been having with his wound. 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 His left great toe and it appears part of the first metatarsal which was amputated. With that being said the area of amputation actually does show some region of necrotic tissue where the flap does not seem to be taking. There is definite necrotic tissue underneath as well the sutures are still in place. Once the sutures are removed I do believe that a lot of this is getting need to be cleaned away. He sees [name] on Wednesday and at that time will be able to see how things are actually doing once he is able to remove some of the necrotic tissue here. I believe this patient may be a good candidate for a wound VAC based on what I am seeing. With that being said we have discussed the possibility of hyperbaric oxygen therapy. With that being said my concern in that regard is that of his ejection fraction which is somewhat low to consider hyperbarics  based on his last test which was on January 20 of 2020 where he registered at 35 to 40%. With that being said I definitely believe that being that this is greater than 6 months he would definitely need to see cardiology and have his ejection fraction rechecked prior to even consideration of the hyperbarics. The other thing is I am not sure that there is actually anything remaining which is showing signs of infection at this time hence the reason that he may also not be a hyperbaric candidate based on that. Obviously the patient can go into the hyperbaric oxygen chamber for a Waggoner grade 3 ulcer but again at this point I am not sure that it qualifies as such based on what I am seeing. There is no signs of active infection currently. This is also out of the timeframe for a failed graft/flap. Patient does have end-stage renal disease and is currently on peritoneal dialysis. He is also having a lot of pain in his abdomen especially on the right side today I am unsure if this could potentially be a kidney stone or something else. His blood pressure was also very faint upon evaluation he is actually going to the ER upon leaving here today he just wanted to get this appointment and first. His most recent hemoglobin A1c on 05/22/2019 was 6.1. He does see [name] on Wednesday and we have gotten permission through the referral to [place] to see this patient even though they are in the postop global 90-day period. 06/19/2019 on evaluation today patient presents for follow-up concerning his issues with his left amputation site. He did see [name] last week and they remove the sutures from the wound. Unfortunately this has dehisced and he does have necrotic tissue noted in the base of the wound. Fortunately there is no signs of obvious or significant active infection is not on any antibiotics currently though he has been having issues with his kidneys he actually saw the kidney doctor earlier today he may be  placed on antibiotics for that shortly. Nonetheless he again was sent for consideration of hyperbaric oxygen therapy but again the patient also has congestive heart failure, chronic kidney disease for which he undergoes peritoneal dialysis, and Willits, [place] A. ([id number]) overall he seems to be in somewhat poor health I am a little concerned about the hyperbaric chamber with regard to his safety. We definitely would need to have a recent echocardiogram to evaluate ejection fraction prior to diving at minimum. Nonetheless I do not see obvious signs of infection at this point and again the majority of the necrotic bone was stated to have been removed based on review of the patient's notes. 10/28; patient I am seeing for the first time. He has a left first ray amputation site. Dehisced surgical wound. The patient was revascularized on 05/23/2019. At that time his common femoral was diffusely diseased but without hemodynamically significant stenosis there was a 50% stenosis at the origin of the SFA. There is a stent in the SFA as well. Profunda femoris 90% stenosed at its origin which collateralizes poorly to the distal left lower extremity arterial system. 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 He had angioplasty was done of the anterior tibial with less than 10% residual stenosis. The posterior tibial was occluded there was also diffuse disease in the proximal one third of the peroneal The patient is using Vach solution wet-to-dry to the deeply necrotic wound which was his left first ray amputation  site. He was initially referred here by his podiatrist [name] at [facility] for review for consideration of hyperbaric oxygen. In order to go forward with hyperbarics we would need to prove that he has deep tissue infection either myositis tendinitis or osteomyelitis which is seems quite likely to me he has although I cannot see the absolute proof of this at the  moment. Necrotic wound with PAD even in the setting of 2 type 2 diabetes would not qualify him necessarily The patient has completed his Cipro for gram-negative's that were cultured with a swab culture. 07/03/2019 upon evaluation today patient appears to be doing somewhat poorly in general with regard to his foot ulcer. I am still concerned about his circulation and how things stand at this point. He actually has an appointment scheduled with Dr. [name] coming up on the ninth which is 1 week from today. With that being said I am concerned this may be too long and I really would like for him to be seen more quickly if at all possible we will get a check into that today. With regard to his wounds he unfortunately has necrotic tissue in the area where the wound has dehisced on his lateral foot and this seems to be quite significant at this point exposing bone as well. There is also significant drainage which is actually a bright green as far as color is concerned and again this has me concerned about Pseudomonas he did culture positive for Pseudomonas on the culture I sent to the lab for evaluation he is no longer on the antibiotics as he has completed the Cipro I think we may need to reinitiate this for short time until we can get the results of the bone culture back I think he may also be a good candidate for hyperbarics if we can get everything cleared at such and prove that he does indeed have a deep infection. Electronic Signature(s) Signed: 07/03/2019 1:57:13 PM By: Worthy Keeler PA-C Entered By: Worthy Keeler on 07/03/2019 13:57:12 Scott [name], [name] ([id number]) -------------------------------------------------------------------------------- Physical Exam Details Patient Name: Scott [name], [name] Date of Service: 07/03/2019 10:45 AM Medical Record Number: [id number] Patient Account Number: [id number] Date of Birth/Sex: 10-Jul-1961 (58 y.o. M) Treating RN: Harold Barban Primary Care  Provider: Norville Haggard Other Clinician: Referring Provider: [facility] Treating Provider/Extender: STONE III, HOYT Weeks in Treatment: 3 Constitutional Well-nourished and well-hydrated in no acute distress. Respiratory normal breathing without difficulty. clear to auscultation bilaterally. Cardiovascular regular rate and rhythm with normal S1, S2. Psychiatric this patient is able to make decisions and demonstrates good insight into disease process. Alert and Oriented x 3. pleasant and cooperative. Notes Patient's wound currently did require sharp debridement which [name] performed last week. He still under the postop global as well as the 10-day global from that procedure with [name]. No debridement was performed today. With that being said the patient in my opinion based on what I am seeing does need to see vascular ASAP and we did contact their office today while the patient was in our office and they are actually going to see him today at 1:00 which would be ideal. Fortunately there is no signs of active infection systemically at this point although obviously there is still evidence of local infection based on what I am seeing at this time. Electronic Signature(s) Signed: 07/03/2019 1:58:07 PM By: Worthy Keeler PA-C Entered By: Worthy Keeler on 07/03/2019 13:58:06 Scott [name], [name] ([id number]) -------------------------------------------------------------------------------- Physician Orders Details  Patient Name: Scott [name], [name] Date of Service: 07/03/2019 10:45 AM Medical Record Number: [id number] Patient Account Number: [id number] Date of Birth/Sex: Sep 09, 1960 (58 y.o. M) Treating RN: Harold Barban Primary Care Provider: Norville Haggard Other Clinician: Referring Provider: [facility] Treating Provider/Extender: STONE III, HOYT Weeks in Treatment: 3 Verbal / Phone Orders: No Diagnosis Coding ICD-10 Coding Code Description E11.621 Type 2 diabetes mellitus with  foot ulcer T81.31XA Disruption of external operation (surgical) wound, not elsewhere classified, initial encounter L97.522 Non-pressure chronic ulcer of other part of left foot with fat layer exposed N18.6 End stage renal disease Z99.2 Dependence on renal dialysis I10 Essential (primary) hypertension Wound Cleansing Wound #4 Left Amputation Site - Toe o Clean wound with Normal Saline. Anesthetic (add to Medication List) Wound #4 Left Amputation Site - Toe o Topical Lidocaine 4% cream applied to wound bed prior to debridement (In Clinic Only). Primary Wound Dressing Wound #4 Left Amputation Site - Toe o Other: - Vashe, apply to gauze, squeeze out excess apply to wound Secondary Dressing Wound #4 Left Amputation Site - Toe o ABD pad - rolled gauze to secure Dressing Change Frequency Wound #4 Left Amputation Site - Toe o Change dressing every day. Follow-up Appointments Wound #4 Left Amputation Site - Toe o Return Appointment in 1 week. Off-Loading o Open toe surgical shoe with peg assist. Patient Medications Allergies: No Known Allergies Notifications Medication Indication Start End Scott [name], [name] ([id number]) Notifications Medication Indication Start End Cipro 07/03/2019 DOSE 1 - oral 500 mg tablet - 1 tablet oral taken 2 times a day for 7 days. Do not take your calcium supplement with this medication Electronic Signature(s) Signed: 07/03/2019 11:34:41 AM By: Worthy Keeler PA-C Entered By: Worthy Keeler on 07/03/2019 11:34:40 Scott [name], [name] ([id number]) -------------------------------------------------------------------------------- Problem List Details Patient Name: Scott [name], [name] Date of Service: 07/03/2019 10:45 AM Medical Record Number: [id number] Patient Account Number: [id number] Date of Birth/Sex: 05-Aug-1961 (58 y.o. M) Treating RN: Harold Barban Primary Care Provider: Norville Haggard Other Clinician: Referring Provider: Norville Haggard Treating Provider/Extender: Melburn Hake, HOYT Weeks in Treatment: 3 Active Problems ICD-10 Evaluated Encounter Code Description Active Date Today Diagnosis E11.621 Type 2 diabetes mellitus with foot ulcer 06/12/2019 No Yes T81.31XA Disruption of external operation (surgical) wound, not 06/12/2019 No Yes elsewhere classified, initial encounter L97.522 Non-pressure chronic ulcer of other part of left foot with fat 06/12/2019 No Yes layer exposed N18.6 End stage renal disease 06/12/2019 No Yes Z99.2 Dependence on renal dialysis 06/12/2019 No Yes I10 Essential (primary) hypertension 06/12/2019 No Yes Inactive Problems Resolved Problems Electronic Signature(s) Signed: 07/03/2019 11:07:45 AM By: Worthy Keeler PA-C Entered By: Worthy Keeler on 07/03/2019 11:07:44 Scott [name], [name] ([id number]) -------------------------------------------------------------------------------- Progress Note/History and Physical Details Patient Name: Kawa, Clance A. Date of Service: 07/03/2019 10:45 AM Medical Record Number: [id number] Patient Account Number: [id number] Date of Birth/Sex: Jun 11, 1961 (58 y.o. M) Treating RN: Harold Barban Primary Care Provider: Norville Haggard Other Clinician: Referring Provider: Norville Haggard Treating Provider/Extender: STONE III, HOYT Weeks in Treatment: 3 Subjective Chief Complaint Information obtained from Patient Chronic right forefoot ulcer. Arterial insufficiency. New onset cellulitis and abscess. History of Present Illness (HPI) The patient is a pleasant 58 year old with a past medical history significant for type 2 diabetes (hemoglobin A1c 13.2 in June 2015), peripheral neuropathy, PVD, and smoking. 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 in Nov 2014 as well as RLE angioplasty by Dr. Corene Cornea  Dew. He says that the surgical site never healed. He reports moderate pain at the site of  his ulceration with pressure. He also describes symptoms consistent with claudication. No ischemic rest pain. ABI on the right is 0.99. Monophasic DP. Xray 08/01/2014 showed no definite osteomyelitis, MRI 08/17/2014 negative for osteomyelitis. Biopsy cultures from 08/01/2014 grew Myroides species, sensitive only to imipenen. Completed 2 week course of doxycycline. Sensitivity to doxy requested but not performed. TCOM 77mmHg. Scheduled to see [name] and [name] in f/u. We have discussed hyperbaric oxygen therapy for his nonhealing Wagner grade 4 ulceration as well as arterial insufficiency but wishes to hold off until he obtains Medicaid coverage, which he is pursuing. He is offloading with a Darco shoe. Has declined total contact casting. Last week he presented with a small area of cellulitis on the dorsum of his right forefoot. Cultures grew methicillin sensitive staph aureus, sensitive to tigecycline. He was started on doxycycline. He returns to clinic for follow-up today and reports worsening redness, swelling, pain, and drainage. He denies any fevers. Readmission: Patient presents today for initial evaluation here in our clinic as a referral from [name] secondary to issues that he has been having with his wound. 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 His left great toe and it appears part of the first metatarsal which was amputated. With that being said the area of amputation actually does show some region of necrotic tissue where the flap does not seem to be taking. There is definite necrotic tissue underneath as well the sutures are still in place. Once the sutures are removed I do believe that a lot of this is getting need to be cleaned away. He sees [name] on Wednesday and at that time will be able to see how things are actually doing once he is able to remove some of the necrotic tissue here. I believe this patient may be a good candidate for a wound VAC based on  what I am seeing. With that being said we have discussed the possibility of hyperbaric oxygen therapy. With that being said my concern in that regard is that of his ejection fraction which is somewhat low to consider hyperbarics based on his last test which was on January 20 of 2020 where he registered at 35 to 40%. With that being said I definitely believe that being that this is greater than 6 months he would definitely need to see cardiology and have his ejection fraction rechecked prior to even consideration of the hyperbarics. The other thing is I am not sure that there is actually anything remaining which is showing signs of infection at this time hence the reason that he may also not be a hyperbaric candidate based on that. Obviously the patient can go into the hyperbaric oxygen chamber for a Waggoner grade 3 ulcer but again at this point I am not sure that it qualifies as such based on what I am seeing. There is no signs of active infection currently. This is also out of the timeframe for a failed graft/flap. Patient does have end-stage renal disease and is currently on peritoneal dialysis. He is also having a lot of pain in his abdomen especially on the right side today I am unsure if this could potentially be a kidney stone or something else. His blood pressure was also very faint upon evaluation he is actually going to the ER upon leaving here today he just wanted to get this appointment and  first. His most recent hemoglobin A1c on 05/22/2019 was 6.1. He does see [name] on Wednesday and we have gotten permission through the referral to [place] to see this patient even though they are in the postop global 90-day period. Scott [name], [name] ([id number]) 06/19/2019 on evaluation today patient presents for follow-up concerning his issues with his left amputation site. He did see [name] last week and they remove the sutures from the wound. Unfortunately this has dehisced and he does have  necrotic tissue noted in the base of the wound. Fortunately there is no signs of obvious or significant active infection is not on any antibiotics currently though he has been having issues with his kidneys he actually saw the kidney doctor earlier today he may be placed on antibiotics for that shortly. Nonetheless he again was sent for consideration of hyperbaric oxygen therapy but again the patient also has congestive heart failure, chronic kidney disease for which he undergoes peritoneal dialysis, and overall he seems to be in somewhat poor health I am a little concerned about the hyperbaric chamber with regard to his safety. We definitely would need to have a recent echocardiogram to evaluate ejection fraction prior to diving at minimum. Nonetheless I do not see obvious signs of infection at this point and again the majority of the necrotic bone was stated to have been removed based on review of the patient's notes. 10/28; patient I am seeing for the first time. He has a left first ray amputation site. Dehisced surgical wound. The patient was revascularized on 05/23/2019. At that time his common femoral was diffusely diseased but without hemodynamically significant stenosis there was a 50% stenosis at the origin of the SFA. There is a stent in the SFA as well. Profunda femoris 90% stenosed at its origin which collateralizes poorly to the distal left lower extremity arterial system. 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 He had angioplasty was done of the anterior tibial with less than 10% residual stenosis. The posterior tibial was occluded there was also diffuse disease in the proximal one third of the peroneal The patient is using Vach solution wet-to-dry to the deeply necrotic wound which was his left first ray amputation site. He was initially referred here by his podiatrist [name] at [facility] for review for consideration of hyperbaric oxygen. In order to go  forward with hyperbarics we would need to prove that he has deep tissue infection either myositis tendinitis or osteomyelitis which is seems quite likely to me he has although I cannot see the absolute proof of this at the moment. Necrotic wound with PAD even in the setting of 2 type 2 diabetes would not qualify him necessarily The patient has completed his Cipro for gram-negative's that were cultured with a swab culture. 07/03/2019 upon evaluation today patient appears to be doing somewhat poorly in general with regard to his foot ulcer. I am still concerned about his circulation and how things stand at this point. He actually has an appointment scheduled with Dr. [name] coming up on the ninth which is 1 week from today. With that being said I am concerned this may be too long and I really would like for him to be seen more quickly if at all possible we will get a check into that today. With regard to his wounds he unfortunately has necrotic tissue in the area where the wound has dehisced on his lateral foot and this seems to be quite significant at this  point exposing bone as well. There is also significant drainage which is actually a bright green as far as color is concerned and again this has me concerned about Pseudomonas he did culture positive for Pseudomonas on the culture I sent to the lab for evaluation he is no longer on the antibiotics as he has completed the Cipro I think we may need to reinitiate this for short time until we can get the results of the bone culture back I think he may also be a good candidate for hyperbarics if we can get everything cleared at such and prove that he does indeed have a deep infection. Patient History Information obtained from Patient. Family History Cancer - Father, Diabetes - Siblings, No family history of Heart Disease, Hereditary Spherocytosis, Hypertension, Kidney Disease, Lung Disease, Seizures, Stroke, Thyroid Problems, Tuberculosis. Social  History Current every day smoker, Marital Status - Single, Alcohol Use - Never, Drug Use - No History, Caffeine Use - Daily - coffee, tea. Medical History Eyes Denies history of Cataracts, Glaucoma, Optic Neuritis Ear/Nose/Mouth/Throat Denies history of Chronic sinus problems/congestion, Middle ear problems Hematologic/Lymphatic Denies history of Anemia, Hemophilia, Human Immunodeficiency Virus, Lymphedema, Sickle Cell Disease Respiratory Denies history of Aspiration, Asthma, Chronic Obstructive Pulmonary Disease (COPD), Pneumothorax, Sleep Apnea, Tuberculosis Cardiovascular Stell, Duane A. ([id number]) Patient has history of Congestive Heart Failure, Coronary Artery Disease, Hypertension, Peripheral Venous Disease Denies history of Angina, Arrhythmia, Deep Vein Thrombosis, Hypotension, Myocardial Infarction, Peripheral Arterial Disease, Phlebitis, Vasculitis Gastrointestinal Denies history of Cirrhosis , Colitis, Crohn s, Hepatitis A, Hepatitis B, Hepatitis C Endocrine Patient has history of Type II Diabetes Denies history of Type I Diabetes Genitourinary Patient has history of End Stage Renal Disease - ESRD on PD Immunological Denies history of Lupus Erythematosus, Raynaud s, Scleroderma Integumentary (Skin) Denies history of History of Burn, History of pressure wounds Musculoskeletal Patient has history of Osteoarthritis Denies history of Gout, Rheumatoid Arthritis, Osteomyelitis Neurologic Patient has history of Neuropathy Denies history of Dementia, Quadriplegia, Paraplegia, Seizure Disorder Oncologic Denies history of Received Chemotherapy, Received Radiation Psychiatric Patient has history of Confinement Anxiety Denies history of Anorexia/bulimia Patient is treated with Insulin, Oral Agents. Blood sugar is tested. Blood sugar results noted at the following times: Breakfast - 200's. Hospitalization/Surgery History - Infection of toe/Amputation. Medical And Surgical  History Notes Cardiovascular HLD Review of Systems (ROS) Constitutional Symptoms (General Health) Denies complaints or symptoms of Fatigue, Fever, Chills, Marked Weight Change. Respiratory Denies complaints or symptoms of Chronic or frequent coughs, Shortness of Breath. Cardiovascular Denies complaints or symptoms of Chest pain, LE edema. Psychiatric Denies complaints or symptoms of Anxiety, Claustrophobia. Objective Constitutional Well-nourished and well-hydrated in no acute distress. Vitals Time Taken: 10:57 AM, Height: 69 in, Weight: 215 lbs, BMI: 31.7, Temperature: 97.9 F, Pulse: 72 bpm, Respiratory Scott [name], [name] ([id number]) Rate: 18 breaths/min, Blood Pressure: 112/42 mmHg. Respiratory normal breathing without difficulty. clear to auscultation bilaterally. Cardiovascular regular rate and rhythm with normal S1, S2. Psychiatric this patient is able to make decisions and demonstrates good insight into disease process. Alert and Oriented x 3. pleasant and cooperative. General Notes: Patient's wound currently did require sharp debridement which [name] performed last week. He still under the postop global as well as the 10-day global from that procedure with [name]. No debridement was performed today. With that being said the patient in my opinion based on what I am seeing does need to see vascular ASAP and we did contact their office today while the patient was in our  office and they are actually going to see him today at 1:00 which would be ideal. Fortunately there is no signs of active infection systemically at this point although obviously there is still evidence of local infection based on what I am seeing at this time. Integumentary (Hair, Skin) Wound #4 status is Open. Original cause of wound was Surgical Injury. The wound is located on the Left Amputation Site - Toe. The wound measures 8.9cm length x 3.2cm width x 2cm depth; 22.368cm^2 area and 44.736cm^3 volume.  There is bone, muscle, tendon, and Fat Layer (Subcutaneous Tissue) Exposed exposed. There is no tunneling or undermining noted. There is a medium amount of purulent drainage noted. Foul odor after cleansing was noted. The wound margin is distinct with the outline attached to the wound base. There is small (1-33%) granulation within the wound bed. There is a large (67-100%) amount of necrotic tissue within the wound bed including Eschar, Adherent Slough and Necrosis of Muscle. Assessment Active Problems ICD-10 Type 2 diabetes mellitus with foot ulcer Disruption of external operation (surgical) wound, not elsewhere classified, initial encounter Non-pressure chronic ulcer of other part of left foot with fat layer exposed End stage renal disease Dependence on renal dialysis Essential (primary) hypertension Plan Wound Cleansing: Wound #4 Left Amputation Site - Toe: Clean wound with Normal Saline. Anesthetic (add to Medication List): Wound #4 Left Amputation Site - Toe: Topical Lidocaine 4% cream applied to wound bed prior to debridement (In Clinic Only). Primary Wound Dressing: Hank, Jodie A. ([id number]) Wound #4 Left Amputation Site - Toe: Other: - Vashe, apply to gauze, squeeze out excess apply to wound Secondary Dressing: Wound #4 Left Amputation Site - Toe: ABD pad - rolled gauze to secure Dressing Change Frequency: Wound #4 Left Amputation Site - Toe: Change dressing every day. Follow-up Appointments: Wound #4 Left Amputation Site - Toe: Return Appointment in 1 week. Off-Loading: Open toe surgical shoe with peg assist. The following medication(s) was prescribed: Cipro oral 500 mg tablet 1 1 tablet oral taken 2 times a day for 7 days. Do not take your calcium supplement with this medication starting 07/03/2019 1. My suggestion at this point is good to be that we go ahead and continue with the Cipro I did send in a refill for the medication for him today for 7 days I will  recheck with him next week and we will see how things are doing. 2. With regard to the wound dressing I still believe that the Dakin soaked gauze dressing is a good option for him we will continue with that. 3. I did contact the vascular office today there can be seeing the patient at 1:00 for evaluation which is excellent news I am very glad that we are able to get him in sooner I really did not feel like this should wait another week. We will see patient back for reevaluation in 1 week here in the clinic. If anything worsens or changes patient will contact our office for additional recommendations. Electronic Signature(s) Signed: 07/03/2019 1:58:48 PM By: Worthy Keeler PA-C Entered By: Worthy Keeler on 07/03/2019 13:58:47 Thelander, Shlome A. ([id number]) -------------------------------------------------------------------------------- ROS/PFSH Details Patient Name: Scott Mount A. Date of Service: 07/03/2019 10:45 AM Medical Record Number: [id number] Patient Account Number: [id number] Date of Birth/Sex: 06/28/1961 (58 y.o. M) Treating RN: Harold Barban Primary Care Provider: Norville Haggard Other Clinician: Referring Provider: Norville Haggard Treating Provider/Extender: STONE III, HOYT Weeks in Treatment: 3 Label Progress Note Print Version as History and Physical for  this encounter Information Obtained From Patient Constitutional Symptoms (General Health) Complaints and Symptoms: Negative for: Fatigue; Fever; Chills; Marked Weight Change Respiratory Complaints and Symptoms: Negative for: Chronic or frequent coughs; Shortness of Breath Medical History: Negative for: Aspiration; Asthma; Chronic Obstructive Pulmonary Disease (COPD); Pneumothorax; Sleep Apnea; Tuberculosis Cardiovascular Complaints and Symptoms: Negative for: Chest pain; LE edema Medical History: Positive for: Congestive Heart Failure; Coronary Artery Disease; Hypertension; Peripheral Venous Disease Negative for:  Angina; Arrhythmia; Deep Vein Thrombosis; Hypotension; Myocardial Infarction; Peripheral Arterial Disease; Phlebitis; Vasculitis Past Medical History Notes: HLD Psychiatric Complaints and Symptoms: Negative for: Anxiety; Claustrophobia Medical History: Positive for: Confinement Anxiety Negative for: Anorexia/bulimia Eyes Medical History: Negative for: Cataracts; Glaucoma; Optic Neuritis Ear/Nose/Mouth/Throat Medical History: Negative for: Chronic sinus problems/congestion; Middle ear problems Hematologic/Lymphatic Dolinar, Gurshan A. ([id number]) Medical History: Negative for: Anemia; Hemophilia; Human Immunodeficiency Virus; Lymphedema; Sickle Cell Disease Gastrointestinal Medical History: Negative for: Cirrhosis ; Colitis; Crohnos; Hepatitis A; Hepatitis B; Hepatitis C Endocrine Medical History: Positive for: Type II Diabetes Negative for: Type I Diabetes Time with diabetes: 52yrs Treated with: Insulin, Oral agents Blood sugar tested every day: Yes Tested : twice daily Blood sugar testing results: Breakfast: 200's Genitourinary Medical History: Positive for: End Stage Renal Disease - ESRD on PD Immunological Medical History: Negative for: Lupus Erythematosus; Raynaudos; Scleroderma Integumentary (Skin) Medical History: Negative for: History of Burn; History of pressure wounds Musculoskeletal Medical History: Positive for: Osteoarthritis Negative for: Gout; Rheumatoid Arthritis; Osteomyelitis Neurologic Medical History: Positive for: Neuropathy Negative for: Dementia; Quadriplegia; Paraplegia; Seizure Disorder Oncologic Medical History: Negative for: Received Chemotherapy; Received Radiation Immunizations Pneumococcal Vaccine: Received Pneumococcal Vaccination: Yes Tetanus Vaccine: Last tetanus shot: 07/03/2013 Implantable Devices Stopa, Mortimer A. ([id number]) None Hospitalization / Surgery History Type of Hospitalization/Surgery Infection of  toe/Amputation Family and Social History Cancer: Yes - Father; Diabetes: Yes - Siblings; Heart Disease: No; Hereditary Spherocytosis: No; Hypertension: No; Kidney Disease: No; Lung Disease: No; Seizures: No; Stroke: No; Thyroid Problems: No; Tuberculosis: No; Current every day smoker; Marital Status - Single; Alcohol Use: Never; Drug Use: No History; Caffeine Use: Daily - coffee, tea; Financial Concerns: No; Food, Clothing or Shelter Needs: No; Support System Lacking: No; Transportation Concerns: No Physician Affirmation I have reviewed and agree with the above information. Electronic Signature(s) Signed: 07/03/2019 4:25:43 PM By: Harold Barban Signed: 07/05/2019 2:09:51 AM By: Worthy Keeler PA-C Entered By: Worthy Keeler on 07/03/2019 13:57:38 Clagett, Spiros A. ([id number]) -------------------------------------------------------------------------------- SuperBill Details Patient Name: Scott [name], Chrsitopher A. Date of Service: 07/03/2019 Medical Record Number: [id number] Patient Account Number: [id number] Date of Birth/Sex: [birth year] (58 y.o. M) Treating RN: Harold Barban Primary Care Provider: Norville Haggard Other Clinician: Referring Provider: [facility] Treating Provider/Extender: STONE III, HOYT Weeks in Treatment: 3 Diagnosis Coding ICD-10 Codes Code Description E11.621 Type 2 diabetes mellitus with foot ulcer T81.31XA Disruption of external operation (surgical) wound, not elsewhere classified, initial encounter L97.522 Non-pressure chronic ulcer of other part of left foot with fat layer exposed N18.6 End stage renal disease Z99.2 Dependence on renal dialysis I10 Essential (primary) hypertension Facility Procedures CPT4 Code: [id number] Description: 99213 - WOUND CARE VISIT-LEV 3 EST PT Modifier: Quantity: 1 Physician Procedures CPT4: Description Modifier Quantity Code [id number] - WC PHYS LEVEL 4 - EST PT 1 ICD-10 Diagnosis Description E11.621 Type 2 diabetes mellitus  with foot ulcer T81.31XA Disruption of external operation (surgical) wound, not elsewhere classified,  initial encounter L97.522 Non-pressure chronic ulcer of other part of left foot with fat layer exposed N18.6 End stage renal disease Electronic  Signature(s) Signed: 07/03/2019 1:59:09 PM By: Worthy Keeler PA-C Entered By: Worthy Keeler on 07/03/2019 13:59:08

## 2019-07-03 NOTE — Progress Notes (Signed)
NASSER, KU ([id number]) Visit Report for 07/03/2019 Arrival Information Details Patient Name: Scott [name], [name] Date of Service: 07/03/2019 10:45 AM Medical Record Number: [id number] Patient Account Number: [id number] Date of Birth/Sex: 1961/06/01 (58 y.o. M) Treating RN: Montey Hora Primary Care Reyaansh Merlo: [facility] Other Clinician: Referring Kinga Cassar: [facility] Treating Hollie Wojahn/Extender: STONE III, HOYT Weeks in Treatment: 3 Visit Information History Since Last Visit Added or deleted any medications: No Patient Arrived: Ambulatory Any new allergies or adverse reactions: No Arrival Time: 10:57 Had a fall or experienced change in No Accompanied By: self activities of daily living that may affect Transfer Assistance: None risk of falls: Patient Identification Verified: Yes Signs or symptoms of abuse/neglect since last visito No Secondary Verification Process Yes Hospitalized since last visit: No Completed: Implantable device outside of the clinic excluding No Patient Has Alerts: Yes cellular tissue based products placed in the center Patient Alerts: Patient on Blood since last visit: Thinner Has Dressing in Place as Prescribed: Yes Effient Pain Present Now: No aspirin 81 DMII Electronic Signature(s) Signed: 07/03/2019 3:46:08 PM By: Montey Hora Entered By: Montey Hora on 07/03/2019 10:57:28 Batts, Jaedyn A. ([id number]) -------------------------------------------------------------------------------- Clinic Level of Care Assessment Details Patient Name: Scott [name], [name] Date of Service: 07/03/2019 10:45 AM Medical Record Number: [id number] Patient Account Number: [id number] Date of Birth/Sex: 07/26/1961 (58 y.o. M) Treating RN: Harold Barban Primary Care Lundy Cozart: Norville Haggard Other Clinician: Referring Cassidy Tabet: [facility] Treating Najwa Spillane/Extender: STONE III, HOYT Weeks in Treatment: 3 Clinic Level of Care Assessment Items TOOL 4 Quantity  Score []  - Use when only an EandM is performed on FOLLOW-UP visit 0 ASSESSMENTS - Nursing Assessment / Reassessment X - Reassessment of Co-morbidities (includes updates in patient status) 1 10 X- 1 5 Reassessment of Adherence to Treatment Plan ASSESSMENTS - Wound and Skin Assessment / Reassessment X - Simple Wound Assessment / Reassessment - one wound 1 5 []  - 0 Complex Wound Assessment / Reassessment - multiple wounds []  - 0 Dermatologic / Skin Assessment (not related to wound area) ASSESSMENTS - Focused Assessment []  - Circumferential Edema Measurements - multi extremities 0 []  - 0 Nutritional Assessment / Counseling / Intervention []  - 0 Lower Extremity Assessment (monofilament, tuning fork, pulses) []  - 0 Peripheral Arterial Disease Assessment (using hand held doppler) ASSESSMENTS - Ostomy and/or Continence Assessment and Care []  - Incontinence Assessment and Management 0 []  - 0 Ostomy Care Assessment and Management (repouching, etc.) PROCESS - Coordination of Care X - Simple Patient / Family Education for ongoing care 1 15 []  - 0 Complex (extensive) Patient / Family Education for ongoing care X- 1 10 Staff obtains Programmer, systems, Records, Test Results / Process Orders []  - 0 Staff telephones HHA, Nursing Homes / Clarify orders / etc []  - 0 Routine Transfer to another Facility (non-emergent condition) []  - 0 Routine Hospital Admission (non-emergent condition) []  - 0 New Admissions / Biomedical engineer / Ordering NPWT, Apligraf, etc. []  - 0 Emergency Hospital Admission (emergent condition) X- 1 10 Simple Discharge Coordination Nanninga, Sriman A. ([id number]) []  - 0 Complex (extensive) Discharge Coordination PROCESS - Special Needs []  - Pediatric / Minor Patient Management 0 []  - 0 Isolation Patient Management []  - 0 Hearing / Language / Visual special needs []  - 0 Assessment of Community assistance (transportation, D/C planning, etc.) []  - 0 Additional  assistance / Altered mentation []  - 0 Support Surface(s) Assessment (bed, cushion, seat, etc.) INTERVENTIONS - Wound Cleansing / Measurement X - Simple Wound Cleansing - one wound 1 5 []  -  0 Complex Wound Cleansing - multiple wounds X- 1 5 Wound Imaging (photographs - any number of wounds) []  - 0 Wound Tracing (instead of photographs) X- 1 5 Simple Wound Measurement - one wound []  - 0 Complex Wound Measurement - multiple wounds INTERVENTIONS - Wound Dressings []  - Small Wound Dressing one or multiple wounds 0 X- 1 15 Medium Wound Dressing one or multiple wounds []  - 0 Large Wound Dressing one or multiple wounds X- 1 5 Application of Medications - topical []  - 0 Application of Medications - injection INTERVENTIONS - Miscellaneous []  - External ear exam 0 []  - 0 Specimen Collection (cultures, biopsies, blood, body fluids, etc.) []  - 0 Specimen(s) / Culture(s) sent or taken to Lab for analysis []  - 0 Patient Transfer (multiple staff / Civil Service fast streamer / Similar devices) []  - 0 Simple Staple / Suture removal (25 or less) []  - 0 Complex Staple / Suture removal (26 or more) []  - 0 Hypo / Hyperglycemic Management (close monitor of Blood Glucose) []  - 0 Ankle / Brachial Index (ABI) - do not check if billed separately X- 1 5 Vital Signs Moster, Hallis A. ([id number]) Has the patient been seen at the hospital within the last three years: Yes Total Score: 95 Level Of Care: New/Established - Level 3 Electronic Signature(s) Signed: 07/03/2019 4:25:43 PM By: Harold Barban Entered By: Harold Barban on 07/03/2019 11:25:30 Ruby, Joaovictor A. ([id number]) -------------------------------------------------------------------------------- Encounter Discharge Information Details Patient Name: Scott [name], [name] Date of Service: 07/03/2019 10:45 AM Medical Record Number: [id number] Patient Account Number: [id number] Date of Birth/Sex: 01/29/1961 (58 y.o. M) Treating RN: Harold Barban Primary Care Margaret Cockerill: Norville Haggard Other Clinician: Referring Sharone Picchi: Norville Haggard Treating Militza Devery/Extender: Melburn Hake, HOYT Weeks in Treatment: 3 Encounter Discharge Information Items Discharge Condition: Stable Ambulatory Status: Ambulatory Discharge Destination: Home Transportation: Private Auto Accompanied By: self Schedule Follow-up Appointment: Yes Clinical Summary of Care: Electronic Signature(s) Signed: 07/03/2019 4:25:43 PM By: Harold Barban Entered By: Harold Barban on 07/03/2019 11:27:09 Vaca, Patricia A. ([id number]) -------------------------------------------------------------------------------- Lower Extremity Assessment Details Patient Name: Scott [name], [name] Date of Service: 07/03/2019 10:45 AM Medical Record Number: [id number] Patient Account Number: [id number] Date of Birth/Sex: September 28, 1960 (58 y.o. M) Treating RN: Montey Hora Primary Care Lener Ventresca: Norville Haggard Other Clinician: Referring Lalania Haseman: [facility] Treating Chyanne Kohut/Extender: STONE III, HOYT Weeks in Treatment: 3 Edema Assessment Assessed: [Left: No] [Right: No] Edema: [Left: N] [Right: o] Vascular Assessment Pulses: Dorsalis Pedis Palpable: [Left:Yes] Electronic Signature(s) Signed: 07/03/2019 3:46:08 PM By: Montey Hora Entered By: Montey Hora on 07/03/2019 11:05:20 Letendre, Verdun A. ([id number]) -------------------------------------------------------------------------------- Multi Wound Chart Details Patient Name: Scott [name], [name] Date of Service: 07/03/2019 10:45 AM Medical Record Number: [id number] Patient Account Number: [id number] Date of Birth/Sex: [birth year] (58 y.o. M) Treating RN: Harold Barban Primary Care Naesha Buckalew: Norville Haggard Other Clinician: Referring Ghali Romero: [facility] Treating Dietra Stokely/Extender: STONE III, HOYT Weeks in Treatment: 3 Vital Signs Height(in): 69 Pulse(bpm): 72 Weight(lbs): 215 Blood Pressure(mmHg): 112/42 Body Mass  Index(BMI): 32 Temperature(F): 97.9 Respiratory Rate 18 (breaths/min): Photos: [N/A:N/A] Wound Location: Left Amputation Site - Toe N/A N/A Wounding Event: Surgical Injury N/A N/A Primary Etiology: Open Surgical Wound N/A N/A Secondary Etiology: Diabetic Wound/Ulcer of the N/A N/A Lower Extremity Comorbid History: Congestive Heart Failure, N/A N/A Coronary Artery Disease, Hypertension, Peripheral Venous Disease, Type II Diabetes, End Stage Renal Disease, Osteoarthritis, Neuropathy, Confinement Anxiety Date Acquired: 05/24/2019 N/A N/A Weeks of Treatment: 3 N/A N/A Wound Status: Open N/A N/A Pending Amputation on Yes N/A N/A Presentation: Measurements L  x W x D 8.9x3.2x2 N/A N/A (cm) Area (cm) : 22.368 N/A N/A Volume (cm) : 44.736 N/A N/A % Reduction in Area: -35.60% N/A N/A % Reduction in Volume: -2612.90% N/A N/A Classification: Partial Thickness N/A N/A Exudate Amount: Medium N/A N/A Exudate Type: Purulent N/A N/A Exudate Color: yellow, brown, green N/A N/A Foul Odor After Cleansing: Yes N/A N/A Kritikos, Yordin A. ([id number]) Odor Anticipated Due to No N/A N/A Product Use: Wound Margin: Flat and Intact N/A N/A Granulation Amount: Small (1-33%) N/A N/A Necrotic Amount: Large (67-100%) N/A N/A Necrotic Tissue: Eschar, Adherent Slough N/A N/A Exposed Structures: Fat Layer (Subcutaneous N/A N/A Tissue) Exposed: Yes Fascia: No Tendon: No Muscle: No Joint: No Bone: No Epithelialization: None N/A N/A Treatment Notes Electronic Signature(s) Signed: 07/03/2019 4:25:43 PM By: Harold Barban Entered By: Harold Barban on 07/03/2019 11:20:22 Norberto, Jordie A. ([id number]) -------------------------------------------------------------------------------- [place] Details Patient Name: [name] Date of Service: 07/03/2019 10:45 AM Medical Record Number: [id number] Patient Account Number: [id number] Date of Birth/Sex: Nov 11, 1960 (58 y.o.  M) Treating RN: Harold Barban Primary Care Lavante Toso: Norville Haggard Other Clinician: Referring Atilla Zollner: [facility] Treating Cecilie Heidel/Extender: STONE III, HOYT Weeks in Treatment: 3 Active Inactive Necrotic Tissue Nursing Diagnoses: Impaired tissue integrity related to necrotic/devitalized tissue Goals: Necrotic/devitalized tissue will be minimized in the wound bed Date Initiated: 06/12/2019 Target Resolution Date: 07/13/2019 Goal Status: Active Patient/caregiver will verbalize understanding of reason and process for debridement of necrotic tissue Date Initiated: 06/12/2019 Target Resolution Date: 06/12/2019 Goal Status: Active Interventions: Assess patient pain level pre-, during and post procedure and prior to discharge Provide education on necrotic tissue and debridement process Treatment Activities: Apply topical anesthetic as ordered : 06/12/2019 Notes: Wound/Skin Impairment Nursing Diagnoses: Impaired tissue integrity Knowledge deficit related to ulceration/compromised skin integrity Goals: Ulcer/skin breakdown will have a volume reduction of 30% by week 4 Date Initiated: 06/12/2019 Target Resolution Date: 07/13/2019 Goal Status: Active Interventions: Assess patient/caregiver ability to obtain necessary supplies Assess patient/caregiver ability to perform ulcer/skin care regimen upon admission and as needed Assess ulceration(s) every visit Notes: Electronic Signature(s) SUHAS, ESTIS A. ([id number]) Signed: 07/03/2019 4:25:43 PM By: Harold Barban Entered By: Harold Barban on 07/03/2019 11:20:00 Horan, Geoffry A. ([id number]) -------------------------------------------------------------------------------- Pain Assessment Details Patient Name: Sanville, Jumar A. Date of Service: 07/03/2019 10:45 AM Medical Record Number: [id number] Patient Account Number: [id number] Date of Birth/Sex: [birth year] (58 y.o. M) Treating RN: Montey Hora Primary Care Stephaie Dardis:  Norville Haggard Other Clinician: Referring Qadir Folks: [facility] Treating Roe Koffman/Extender: STONE III, HOYT Weeks in Treatment: 3 Active Problems Location of Pain Severity and Description of Pain Patient Has Paino Yes Site Locations Pain Location: Pain in Ulcers With Dressing Change: Yes Duration of the Pain. Constant / Intermittento Intermittent Pain Management and Medication Current Pain Management: Electronic Signature(s) Signed: 07/03/2019 3:46:08 PM By: Montey Hora Entered By: Montey Hora on 07/03/2019 10:57:41 Siverling, Aravind A. ([id number]) -------------------------------------------------------------------------------- Patient/Caregiver Education Details Patient Name: [name] Date of Service: 07/03/2019 10:45 AM Medical Record Number: [id number] Patient Account Number: [id number] Date of Birth/Gender: Jan 22, 1961 (58 y.o. M) Treating RN: Harold Barban Primary Care Physician: Norville Haggard Other Clinician: Referring Physician: Norville Haggard Treating Physician/Extender: Melburn Hake, HOYT Weeks in Treatment: 3 Education Assessment Education Provided To: Patient Education Topics Provided Wound/Skin Impairment: Handouts: Caring for Your Ulcer Methods: Demonstration, Explain/Verbal Responses: State content correctly Electronic Signature(s) Signed: 07/03/2019 4:25:43 PM By: Harold Barban Entered By: Harold Barban on 07/03/2019 11:20:53 Hazen, Mussa A. ([id number]) -------------------------------------------------------------------------------- Wound Assessment Details Patient Name: Scott [name], [name]  Date of Service: 07/03/2019 10:45 AM Medical Record Number: [id number] Patient Account Number: [id number] Date of Birth/Sex: [birth year] (58 y.o. M) Treating RN: Montey Hora Primary Care Ozelle Brubacher: Norville Haggard Other Clinician: Referring Wayde Gopaul: [facility] Treating Gwendlyon Zumbro/Extender: STONE III, HOYT Weeks in Treatment: 3 Wound Status Wound Number:  4 Primary Open Surgical Wound Etiology: Wound Location: Left Amputation Site - Toe Secondary Diabetic Wound/Ulcer of the Lower Extremity Wounding Event: Surgical Injury Etiology: Date Acquired: 05/24/2019 Wound Open Weeks Of Treatment: 3 Status: Clustered Wound: No Comorbid Congestive Heart Failure, Coronary Artery Pending Amputation On Presentation History: Disease, Hypertension, Peripheral Venous Disease, Type II Diabetes, End Stage Renal Disease, Osteoarthritis, Neuropathy, Confinement Anxiety Photos Wound Measurements Length: (cm) 8.9 Width: (cm) 3.2 Depth: (cm) 2 Area: (cm) 22.368 Volume: (cm) 44.736 % Reduction in Area: -35.6% % Reduction in Volume: -2612.9% Epithelialization: None Tunneling: No Undermining: No Wound Description Full Thickness With Exposed Support Foul Odor A Classification: Structures Due to Prod Wound Margin: Distinct, outline attached Slough/Fibr Exudate Medium Amount: Exudate Type: Purulent Exudate Color: yellow, brown, green fter Cleansing: Yes uct Use: No ino Yes Wound Bed Granulation Amount: Small (1-33%) Exposed Structure Necrotic Amount: Large (67-100%) Fascia Exposed: No Necrotic Quality: Eschar, Adherent Slough Fat Layer (Subcutaneous Tissue) Exposed: Yes Tendon Exposed: Yes Aslinger, Rosevelt A. ([id number]) Muscle Exposed: Yes Necrosis of Muscle: Yes Joint Exposed: No Bone Exposed: Yes Treatment Notes Wound #4 (Left Amputation Site - Toe) Notes Dakins, dry gauze, ABD, Conform Electronic Signature(s) Signed: 07/03/2019 1:56:46 PM By: Worthy Keeler PA-C Signed: 07/03/2019 3:46:08 PM By: Montey Hora Entered By: Worthy Keeler on 07/03/2019 13:56:46 Meiklejohn, Jacaden A. ([id number]) -------------------------------------------------------------------------------- Vitals Details Patient Name: Scott [name], [name] Date of Service: 07/03/2019 10:45 AM Medical Record Number: [id number] Patient Account Number: [id number] Date of  Birth/Sex: 1960/11/01 (58 y.o. M) Treating RN: Montey Hora Primary Care Tyrone Balash: [facility] Other Clinician: Referring Gwendloyn Forsee: [facility] Treating Mamie Diiorio/Extender: STONE III, HOYT Weeks in Treatment: 3 Vital Signs Time Taken: 10:57 Temperature (F): 97.9 Height (in): 69 Pulse (bpm): 72 Weight (lbs): 215 Respiratory Rate (breaths/min): 18 Body Mass Index (BMI): 31.7 Blood Pressure (mmHg): 112/42 Reference Range: 80 - 120 mg / dl Electronic Signature(s) Signed: 07/03/2019 3:46:08 PM By: Montey Hora Entered By: Montey Hora on 07/03/2019 10:58:27

## 2019-07-04 ENCOUNTER — Ambulatory Visit: Admission: RE | Admit: 2019-07-04 | Payer: Medicare Other | Source: Ambulatory Visit

## 2019-07-04 ENCOUNTER — Telehealth: Payer: Self-pay | Admitting: Urology

## 2019-07-04 ENCOUNTER — Telehealth (INDEPENDENT_AMBULATORY_CARE_PROVIDER_SITE_OTHER): Payer: Self-pay

## 2019-07-04 NOTE — Telephone Encounter (Signed)
I attempted to contact the patient, a message was left for a return call. Patient is scheduled with Dr. [name] for a left leg angio on 07/18/2019 with a 9:00 am arrival time to the [facility]. Patient will do his Covid testing on 07/14/2019 between 12:30-2:30 pm at the [facility]. Pre-procedure instructions along with dates will be mailed to the patient.

## 2019-07-04 NOTE — Telephone Encounter (Addendum)
Received a staff message from Dr. Juleen China indicating that he would prefer for this patient to avoid IV contrast.  He explained that with peritoneal dialysis, there is some required level of renal function and IV contrast can further diminish that making peritoneal dialysis no longer an option.  As such, we will cancel his CT scan today.  I would like to arrange for virtual visit with the patient to discuss alternative options for further diagnostic evaluation which may include cystoscopy with bilateral retrograde in the operating room.  Please schedule virtual visit to discuss.  Hollice Espy, MD

## 2019-07-04 NOTE — Telephone Encounter (Signed)
Unable to reach patient-left VM asked to return call.

## 2019-07-06 NOTE — Telephone Encounter (Signed)
2nd attempt to reach patient-left VM asked to return call

## 2019-07-10 ENCOUNTER — Other Ambulatory Visit: Payer: Self-pay

## 2019-07-10 ENCOUNTER — Ambulatory Visit (INDEPENDENT_AMBULATORY_CARE_PROVIDER_SITE_OTHER): Payer: Medicare Other | Admitting: Vascular Surgery

## 2019-07-10 ENCOUNTER — Encounter: Payer: Medicare Other | Admitting: Physician Assistant

## 2019-07-10 ENCOUNTER — Encounter (INDEPENDENT_AMBULATORY_CARE_PROVIDER_SITE_OTHER): Payer: Medicare Other

## 2019-07-10 DIAGNOSIS — E11621 Type 2 diabetes mellitus with foot ulcer: Secondary | ICD-10-CM | POA: Diagnosis not present

## 2019-07-10 NOTE — Progress Notes (Addendum)
DEVANTAE, BABE ([id number]) Visit Report for 07/10/2019 Chief Complaint Document Details Patient Name: Scott [name], [name] Date of Service: 07/10/2019 2:45 PM Medical Record Number: [id number] Patient Account Number: [id number] Date of Birth/Sex: [birth year] (58 y.o. M) Treating RN: Harold Barban Primary Care Provider: Norville Haggard Other Clinician: Referring Provider: Norville Haggard Treating Provider/Extender: STONE III, Suriah Peragine Weeks in Treatment: 4 Information Obtained from: Patient Chief Complaint Chronic right forefoot ulcer. Arterial insufficiency. New onset cellulitis and abscess. Electronic Signature(s) Signed: 07/10/2019 2:43:46 PM By: Worthy Keeler PA-C Entered By: Worthy Keeler on 07/10/2019 14:43:46 Hartshorne, Lorene A. ([id number]) -------------------------------------------------------------------------------- HPI Details Patient Name: Scott Mount A. Date of Service: 07/10/2019 2:45 PM Medical Record Number: [id number] Patient Account Number: [id number] Date of Birth/Sex: Dec 01, 1960 (58 y.o. M) Treating RN: Harold Barban Primary Care Provider: Norville Haggard Other Clinician: Referring Provider: [facility] Treating Provider/Extender: STONE III, Allayna Erlich Weeks in Treatment: 4 History of Present Illness HPI Description: The patient is a pleasant 58 year old with a past medical history significant for type 2 diabetes (hemoglobin A1c 13.2 in June 2015), peripheral neuropathy, PVD, and smoking. 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 in Nov 2014 as well as RLE angioplasty by [name]. He says that the surgical site never healed. He reports moderate pain at the site of his ulceration with pressure. He also describes symptoms consistent with claudication. No ischemic rest pain. ABI on the right is 0.99. Monophasic DP. Xray 08/01/2014 showed no definite osteomyelitis, MRI 08/17/2014 negative for  osteomyelitis. Biopsy cultures from 08/01/2014 grew Myroides species, sensitive only to imipenen. Completed 2 week course of doxycycline. Sensitivity to doxy requested but not performed. TCOM 90mmHg. Scheduled to see [name] and [name] in f/u. We have discussed hyperbaric oxygen therapy for his nonhealing Wagner grade 4 ulceration as well as arterial insufficiency but wishes to hold off until he obtains Medicaid coverage, which he is pursuing. He is offloading with a Darco shoe. Has declined total contact casting. Last week he presented with a small area of cellulitis on the dorsum of his right forefoot. Cultures grew methicillin sensitive staph aureus, sensitive to tigecycline. He was started on doxycycline. He returns to clinic for follow-up today and reports worsening redness, swelling, pain, and drainage. He denies any fevers. Readmission: Patient presents today for initial evaluation here in our clinic as a referral from [name] secondary to issues that he has been having with his wound. 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 His left great toe and it appears part of the first metatarsal which was amputated. With that being said the area of amputation actually does show some region of necrotic tissue where the flap does not seem to be taking. There is definite necrotic tissue underneath as well the sutures are still in place. Once the sutures are removed I do believe that a lot of this is getting need to be cleaned away. He sees [name] on Wednesday and at that time will be able to see how things are actually doing once he is able to remove some of the necrotic tissue here. I believe this patient may be a good candidate for a wound VAC based on what I am seeing. With that being said we have discussed the possibility of hyperbaric oxygen therapy. With that being said my concern in that regard is that of his ejection fraction which is somewhat low to consider hyperbarics  based on his last test which was on January 20 of 2020 where he registered at 35 to 40%. With that being said I definitely believe that being that this is greater than 6 months he would definitely need to see cardiology and have his ejection fraction rechecked prior to even consideration of the hyperbarics. The other thing is I am not sure that there is actually anything remaining which is showing signs of infection at this time hence the reason that he may also not be a hyperbaric candidate based on that. Obviously the patient can go into the hyperbaric oxygen chamber for a Waggoner grade 3 ulcer but again at this point I am not sure that it qualifies as such based on what I am seeing. There is no signs of active infection currently. This is also out of the timeframe for a failed graft/flap. Patient does have end-stage renal disease and is currently on peritoneal dialysis. He is also having a lot of pain in his abdomen especially on the right side today I am unsure if this could potentially be a kidney stone or something else. His blood pressure was also very faint upon evaluation he is actually going to the ER upon leaving here today he just wanted to get this appointment and first. His most recent hemoglobin A1c on 05/22/2019 was 6.1. He does see [name] on Wednesday and we have gotten permission through the referral to [place] to see this patient even though they are in the postop global 90-day period. 06/19/2019 on evaluation today patient presents for follow-up concerning his issues with his left amputation site. He did see [name] last week and they remove the sutures from the wound. Unfortunately this has dehisced and he does have necrotic tissue noted in the base of the wound. Fortunately there is no signs of obvious or significant active infection is not on any antibiotics currently though he has been having issues with his kidneys he actually saw the kidney doctor earlier today he may be  placed on antibiotics for that shortly. Nonetheless he again was sent for consideration of hyperbaric oxygen therapy but again the patient also has congestive heart failure, chronic kidney disease for which he undergoes peritoneal dialysis, and Hoshino, Desjuan A. ([id number]) overall he seems to be in somewhat poor health I am a little concerned about the hyperbaric chamber with regard to his safety. We definitely would need to have a recent echocardiogram to evaluate ejection fraction prior to diving at minimum. Nonetheless I do not see obvious signs of infection at this point and again the majority of the necrotic bone was stated to have been removed based on review of the patient's notes. 10/28; patient I am seeing for the first time. He has a left first ray amputation site. Dehisced surgical wound. The patient was revascularized on 05/23/2019. At that time his common femoral was diffusely diseased but without hemodynamically significant stenosis there was a 50% stenosis at the origin of the SFA. There is a stent in the SFA as well. Profunda femoris 90% stenosed at its origin which collateralizes poorly to the distal left lower extremity arterial system. 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 He had angioplasty was done of the anterior tibial with less than 10% residual stenosis. The posterior tibial was occluded there was also diffuse disease in the proximal one third of the peroneal The patient is using Vach solution wet-to-dry to the deeply necrotic wound which was his left first ray amputation  site. He was initially referred here by his podiatrist [name] at [facility] for review for consideration of hyperbaric oxygen. In order to go forward with hyperbarics we would need to prove that he has deep tissue infection either myositis tendinitis or osteomyelitis which is seems quite likely to me he has although I cannot see the absolute proof of this at the  moment. Necrotic wound with PAD even in the setting of 2 type 2 diabetes would not qualify him necessarily The patient has completed his Cipro for gram-negative's that were cultured with a swab culture. 07/03/2019 upon evaluation today patient appears to be doing somewhat poorly in general with regard to his foot ulcer. I am still concerned about his circulation and how things stand at this point. He actually has an appointment scheduled with Dr. [name] coming up on the ninth which is 1 week from today. With that being said I am concerned this may be too long and I really would like for him to be seen more quickly if at all possible we will get a check into that today. With regard to his wounds he unfortunately has necrotic tissue in the area where the wound has dehisced on his lateral foot and this seems to be quite significant at this point exposing bone as well. There is also significant drainage which is actually a bright green as far as color is concerned and again this has me concerned about Pseudomonas he did culture positive for Pseudomonas on the culture I sent to the lab for evaluation he is no longer on the antibiotics as he has completed the Cipro I think we may need to reinitiate this for short time until we can get the results of the bone culture back I think he may also be a good candidate for hyperbarics if we can get everything cleared at such and prove that he does indeed have a deep infection. 07/10/2019 upon evaluation today patient actually appears to be doing about the same with regard to his foot ulcer at this time. He has been tolerating the dressing changes without complication. He still has compromised blood flow he did see vascular they are going to actually be going to take him to the OR in the next 1-2 weeks for surgical intervention. I believe he was telling me that there can be looking at putting a stent in as well. With that being said I think that that obviously will  help improve his healing I think that is good to be in fact one of the most important factors. With that being said he still has not gotten appointment with infectious disease specialist we did have a conversation about that as well that would be detailed in the plan. Electronic Signature(s) Signed: 07/10/2019 3:36:51 PM By: Worthy Keeler PA-C Entered By: Worthy Keeler on 07/10/2019 15:36:50 Farrugia, Shashank A. ([id number]) -------------------------------------------------------------------------------- Physical Exam Details Patient Name: [name], [name] Date of Service: 07/10/2019 2:45 PM Medical Record Number: [id number] Patient Account Number: [id number] Date of Birth/Sex: 1960-09-15 (58 y.o. M) Treating RN: Harold Barban Primary Care Provider: Norville Haggard Other Clinician: Referring Provider: [facility] Treating Provider/Extender: STONE III, Agata Lucente Weeks in Treatment: 4 Constitutional Well-nourished and well-hydrated in no acute distress. Respiratory normal breathing without difficulty. clear to auscultation bilaterally. Cardiovascular regular rate and rhythm with normal S1, S2. Psychiatric this patient is able to make decisions and demonstrates good insight into disease process. Alert and Oriented x 3. pleasant and cooperative. Notes With regard to the  patient's wound bed there was some necrotic tissue but I really did not want to perform aggressive debridement today based on the fact that the patient unfortunately has poor blood flow I think we need to obtain more appropriate blood flow before we progressed to more aggressive debridement. I think that will become necessary however. I also think he may be a candidate for a wound VAC but again were waiting appropriate blood flow to see how things do initially before we can get things clean enough to initiate a wound VAC. Electronic Signature(s) Signed: 07/10/2019 3:37:44 PM By: Worthy Keeler PA-C Entered By: Worthy Keeler on 07/10/2019 15:37:44 [place], Tayt AMarland Kitchen ([id number]) -------------------------------------------------------------------------------- Physician Orders Details Patient Name: Scott Mount A. Date of Service: 07/10/2019 2:45 PM Medical Record Number: [id number] Patient Account Number: [id number] Date of Birth/Sex: 1960-10-18 (58 y.o. M) Treating RN: Harold Barban Primary Care Provider: Norville Haggard Other Clinician: Referring Provider: [facility] Treating Provider/Extender: STONE III, Earle Troiano Weeks in Treatment: 4 Verbal / Phone Orders: No Diagnosis Coding ICD-10 Coding Code Description E11.621 Type 2 diabetes mellitus with foot ulcer T81.31XA Disruption of external operation (surgical) wound, not elsewhere classified, initial encounter L97.522 Non-pressure chronic ulcer of other part of left foot with fat layer exposed N18.6 End stage renal disease Z99.2 Dependence on renal dialysis I10 Essential (primary) hypertension Wound Cleansing Wound #4 Left Amputation Site - Toe o Clean wound with Normal Saline. Anesthetic (add to Medication List) Wound #4 Left Amputation Site - Toe o Topical Lidocaine 4% cream applied to wound bed prior to debridement (In Clinic Only). Primary Wound Dressing Wound #4 Left Amputation Site - Toe o Other: - Vashe, apply to gauze, squeeze out excess apply to wound Secondary Dressing Wound #4 Left Amputation Site - Toe o ABD pad - rolled gauze to secure Dressing Change Frequency Wound #4 Left Amputation Site - Toe o Change dressing every day. Follow-up Appointments Wound #4 Left Amputation Site - Toe o Return Appointment in 1 week. Off-Loading o Open toe surgical shoe with peg assist. Electronic Signature(s) Signed: 07/10/2019 4:23:10 PM By: Harold Barban Signed: 07/10/2019 4:43:56 PM By: Marlyne Beards, Deondrick A. ([id number]) Entered By: Harold Barban on 07/10/2019 15:13:56 Blanchet, Kyre A.  ([id number]) -------------------------------------------------------------------------------- Problem List Details Patient Name: [name], [name] Date of Service: 07/10/2019 2:45 PM Medical Record Number: [id number] Patient Account Number: [id number] Date of Birth/Sex: August 16, 1961 (58 y.o. M) Treating RN: Harold Barban Primary Care Provider: Norville Haggard Other Clinician: Referring Provider: Norville Haggard Treating Provider/Extender: Melburn Hake, Ricard Faulkner Weeks in Treatment: 4 Active Problems ICD-10 Evaluated Encounter Code Description Active Date Today Diagnosis E11.621 Type 2 diabetes mellitus with foot ulcer 06/12/2019 No Yes T81.31XA Disruption of external operation (surgical) wound, not 06/12/2019 No Yes elsewhere classified, initial encounter L97.522 Non-pressure chronic ulcer of other part of left foot with fat 06/12/2019 No Yes layer exposed N18.6 End stage renal disease 06/12/2019 No Yes Z99.2 Dependence on renal dialysis 06/12/2019 No Yes I10 Essential (primary) hypertension 06/12/2019 No Yes Inactive Problems Resolved Problems Electronic Signature(s) Signed: 07/10/2019 2:43:36 PM By: Worthy Keeler PA-C Entered By: Worthy Keeler on 07/10/2019 14:43:36 Contee, Glade A. ([id number]) -------------------------------------------------------------------------------- Progress Note/History and Physical Details Patient Name: Tebbetts, Tayshawn A. Date of Service: 07/10/2019 2:45 PM Medical Record Number: [id number] Patient Account Number: [id number] Date of Birth/Sex: 25-Jan-1961 (58 y.o. M) Treating RN: Harold Barban Primary Care Provider: Norville Haggard Other Clinician: Referring Provider: Norville Haggard Treating Provider/Extender: STONE III, Josph [place]fleet Weeks in Treatment: 4 Subjective Chief  Complaint Information obtained from Patient Chronic right forefoot ulcer. Arterial insufficiency. New onset cellulitis and abscess. History of Present Illness (HPI) The patient is a pleasant  58 year old with a past medical history significant for type 2 diabetes (hemoglobin A1c 13.2 in June 2015), peripheral neuropathy, PVD, and smoking. 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 in Nov 2014 as well as RLE angioplasty by [name]. He says that the surgical site never healed. He reports moderate pain at the site of his ulceration with pressure. He also describes symptoms consistent with claudication. No ischemic rest pain. ABI on the right is 0.99. Monophasic DP. Xray 08/01/2014 showed no definite osteomyelitis, MRI 08/17/2014 negative for osteomyelitis. Biopsy cultures from 08/01/2014 grew Myroides species, sensitive only to imipenen. Completed 2 week course of doxycycline. Sensitivity to doxy requested but not performed. TCOM 68mmHg. Scheduled to see [name] and [name] in f/u. We have discussed hyperbaric oxygen therapy for his nonhealing Wagner grade 4 ulceration as well as arterial insufficiency but wishes to hold off until he obtains Medicaid coverage, which he is pursuing. He is offloading with a Darco shoe. Has declined total contact casting. Last week he presented with a small area of cellulitis on the dorsum of his right forefoot. Cultures grew methicillin sensitive staph aureus, sensitive to tigecycline. He was started on doxycycline. He returns to clinic for follow-up today and reports worsening redness, swelling, pain, and drainage. He denies any fevers. Readmission: Patient presents today for initial evaluation here in our clinic as a referral from [name] secondary to issues that he has been having with his wound. 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 His left great toe and it appears part of the first metatarsal which was amputated. With that being said the area of amputation actually does show some region of necrotic tissue where the flap does not seem to be taking. There is  definite necrotic tissue underneath as well the sutures are still in place. Once the sutures are removed I do believe that a lot of this is getting need to be cleaned away. He sees [name] on Wednesday and at that time will be able to see how things are actually doing once he is able to remove some of the necrotic tissue here. I believe this patient may be a good candidate for a wound VAC based on what I am seeing. With that being said we have discussed the possibility of hyperbaric oxygen therapy. With that being said my concern in that regard is that of his ejection fraction which is somewhat low to consider hyperbarics based on his last test which was on January 20 of 2020 where he registered at 35 to 40%. With that being said I definitely believe that being that this is greater than 6 months he would definitely need to see cardiology and have his ejection fraction rechecked prior to even consideration of the hyperbarics. The other thing is I am not sure that there is actually anything remaining which is showing signs of infection at this time hence the reason that he may also not be a hyperbaric candidate based on that. Obviously the patient can go into the hyperbaric oxygen chamber for a Waggoner grade 3 ulcer but again at this point I am not sure that it qualifies as such based on what I am seeing. There is no signs of active infection currently. This is also  out of the timeframe for a failed graft/flap. Patient does have end-stage renal disease and is currently on peritoneal dialysis. He is also having a lot of pain in his abdomen especially on the right side today I am unsure if this could potentially be a kidney stone or something else. His blood pressure was also very faint upon evaluation he is actually going to the ER upon leaving here today he just wanted to get this appointment and first. His most recent hemoglobin A1c on 05/22/2019 was 6.1. He does see [name] on Wednesday and we  have gotten permission through the referral to [place] to see this patient even though they are in the postop global 90-day period. Azzara, Amro A. ([id number]) 06/19/2019 on evaluation today patient presents for follow-up concerning his issues with his left amputation site. He did see [name] last week and they remove the sutures from the wound. Unfortunately this has dehisced and he does have necrotic tissue noted in the base of the wound. Fortunately there is no signs of obvious or significant active infection is not on any antibiotics currently though he has been having issues with his kidneys he actually saw the kidney doctor earlier today he may be placed on antibiotics for that shortly. Nonetheless he again was sent for consideration of hyperbaric oxygen therapy but again the patient also has congestive heart failure, chronic kidney disease for which he undergoes peritoneal dialysis, and overall he seems to be in somewhat poor health I am a little concerned about the hyperbaric chamber with regard to his safety. We definitely would need to have a recent echocardiogram to evaluate ejection fraction prior to diving at minimum. Nonetheless I do not see obvious signs of infection at this point and again the majority of the necrotic bone was stated to have been removed based on review of the patient's notes. 10/28; patient I am seeing for the first time. He has a left first ray amputation site. Dehisced surgical wound. The patient was revascularized on 05/23/2019. At that time his common femoral was diffusely diseased but without hemodynamically significant stenosis there was a 50% stenosis at the origin of the SFA. There is a stent in the SFA as well. Profunda femoris 90% stenosed at its origin which collateralizes poorly to the distal left lower extremity arterial system. 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 He had angioplasty was done of the anterior tibial with less  than 10% residual stenosis. The posterior tibial was occluded there was also diffuse disease in the proximal one third of the peroneal The patient is using Vach solution wet-to-dry to the deeply necrotic wound which was his left first ray amputation site. He was initially referred here by his podiatrist [name] at [facility] for review for consideration of hyperbaric oxygen. In order to go forward with hyperbarics we would need to prove that he has deep tissue infection either myositis tendinitis or osteomyelitis which is seems quite likely to me he has although I cannot see the absolute proof of this at the moment. Necrotic wound with PAD even in the setting of 2 type 2 diabetes would not qualify him necessarily The patient has completed his Cipro for gram-negative's that were cultured with a swab culture. 07/03/2019 upon evaluation today patient appears to be doing somewhat poorly in general with regard to his foot ulcer. I am still concerned about his circulation and how things stand at this point. He actually has an appointment scheduled with Dr.  Schnier coming up on the ninth which is 1 week from today. With that being said I am concerned this may be too long and I really would like for him to be seen more quickly if at all possible we will get a check into that today. With regard to his wounds he unfortunately has necrotic tissue in the area where the wound has dehisced on his lateral foot and this seems to be quite significant at this point exposing bone as well. There is also significant drainage which is actually a bright green as far as color is concerned and again this has me concerned about Pseudomonas he did culture positive for Pseudomonas on the culture I sent to the lab for evaluation he is no longer on the antibiotics as he has completed the Cipro I think we may need to reinitiate this for short time until we can get the results of the bone culture back I think he may also be a  good candidate for hyperbarics if we can get everything cleared at such and prove that he does indeed have a deep infection. 07/10/2019 upon evaluation today patient actually appears to be doing about the same with regard to his foot ulcer at this time. He has been tolerating the dressing changes without complication. He still has compromised blood flow he did see vascular they are going to actually be going to take him to the OR in the next 1-2 weeks for surgical intervention. I believe he was telling me that there can be looking at putting a stent in as well. With that being said I think that that obviously will help improve his healing I think that is good to be in fact one of the most important factors. With that being said he still has not gotten appointment with infectious disease specialist we did have a conversation about that as well that would be detailed in the plan. Patient History Information obtained from Patient. Family History Cancer - Father, Diabetes - Siblings, No family history of Heart Disease, Hereditary Spherocytosis, Hypertension, Kidney Disease, Lung Disease, Seizures, Stroke, Thyroid Problems, Tuberculosis. Social History Current every day smoker, Marital Status - Single, Alcohol Use - Never, Drug Use - No History, Caffeine Use - Daily - coffee, tea. Medical History Eyes Denies history of Cataracts, Glaucoma, Optic Neuritis Koepp, Mancel A. ([id number]) Ear/Nose/Mouth/Throat Denies history of Chronic sinus problems/congestion, Middle ear problems Hematologic/Lymphatic Denies history of Anemia, Hemophilia, Human Immunodeficiency Virus, Lymphedema, Sickle Cell Disease Respiratory Denies history of Aspiration, Asthma, Chronic Obstructive Pulmonary Disease (COPD), Pneumothorax, Sleep Apnea, Tuberculosis Cardiovascular Patient has history of Congestive Heart Failure, Coronary Artery Disease, Hypertension, Peripheral Venous Disease Denies history of Angina,  Arrhythmia, Deep Vein Thrombosis, Hypotension, Myocardial Infarction, Peripheral Arterial Disease, Phlebitis, Vasculitis Gastrointestinal Denies history of Cirrhosis , Colitis, Crohn s, Hepatitis A, Hepatitis B, Hepatitis C Endocrine Patient has history of Type II Diabetes Denies history of Type I Diabetes Genitourinary Patient has history of End Stage Renal Disease - ESRD on PD Immunological Denies history of Lupus Erythematosus, Raynaud s, Scleroderma Integumentary (Skin) Denies history of History of Burn, History of pressure wounds Musculoskeletal Patient has history of Osteoarthritis Denies history of Gout, Rheumatoid Arthritis, Osteomyelitis Neurologic Patient has history of Neuropathy Denies history of Dementia, Quadriplegia, Paraplegia, Seizure Disorder Oncologic Denies history of Received Chemotherapy, Received Radiation Psychiatric Patient has history of Confinement Anxiety Denies history of Anorexia/bulimia Patient is treated with Insulin, Oral Agents. Blood sugar is tested. Blood sugar results noted at the following times: Breakfast -  200's. Hospitalization/Surgery History - Infection of toe/Amputation. Medical And Surgical History Notes Cardiovascular HLD Review of Systems (ROS) Constitutional Symptoms (General Health) Denies complaints or symptoms of Fatigue, Fever, Chills, Marked Weight Change. Respiratory Denies complaints or symptoms of Chronic or frequent coughs, Shortness of Breath. Cardiovascular Denies complaints or symptoms of Chest pain, LE edema. Psychiatric Denies complaints or symptoms of Anxiety, Claustrophobia. Colborn, Curt A. ([id number]) Objective Constitutional Well-nourished and well-hydrated in no acute distress. Vitals Time Taken: 2:48 PM, Height: 69 in, Weight: 215 lbs, BMI: 31.7, Temperature: 98.0 F, Pulse: 85 bpm, Respiratory Rate: 18 breaths/min, Blood Pressure: 132/44 mmHg. Respiratory normal breathing without difficulty. clear  to auscultation bilaterally. Cardiovascular regular rate and rhythm with normal S1, S2. Psychiatric this patient is able to make decisions and demonstrates good insight into disease process. Alert and Oriented x 3. pleasant and cooperative. General Notes: With regard to the patient's wound bed there was some necrotic tissue but I really did not want to perform aggressive debridement today based on the fact that the patient unfortunately has poor blood flow I think we need to obtain more appropriate blood flow before we progressed to more aggressive debridement. I think that will become necessary however. I also think he may be a candidate for a wound VAC but again were waiting appropriate blood flow to see how things do initially before we can get things clean enough to initiate a wound VAC. Integumentary (Hair, Skin) Wound #4 status is Open. Original cause of wound was Surgical Injury. The wound is located on the Left Amputation Site - Toe. The wound measures 9cm length x 3.3cm width x 1.8cm depth; 23.326cm^2 area and 41.987cm^3 volume. There is bone, muscle, tendon, and Fat Layer (Subcutaneous Tissue) Exposed exposed. There is no tunneling or undermining noted. There is a medium amount of purulent drainage noted. Foul odor after cleansing was noted. The wound margin is distinct with the outline attached to the wound base. There is small (1-33%) granulation within the wound bed. There is a large (67-100%) amount of necrotic tissue within the wound bed including Eschar, Adherent Slough and Necrosis of Muscle. Assessment Active Problems ICD-10 Type 2 diabetes mellitus with foot ulcer Disruption of external operation (surgical) wound, not elsewhere classified, initial encounter Non-pressure chronic ulcer of other part of left foot with fat layer exposed End stage renal disease Dependence on renal dialysis Essential (primary) hypertension Plan Wound Cleansing: Tates, Nolen A.  ([id number]) Wound #4 Left Amputation Site - Toe: Clean wound with Normal Saline. Anesthetic (add to Medication List): Wound #4 Left Amputation Site - Toe: Topical Lidocaine 4% cream applied to wound bed prior to debridement (In Clinic Only). Primary Wound Dressing: Wound #4 Left Amputation Site - Toe: Other: - Vashe, apply to gauze, squeeze out excess apply to wound Secondary Dressing: Wound #4 Left Amputation Site - Toe: ABD pad - rolled gauze to secure Dressing Change Frequency: Wound #4 Left Amputation Site - Toe: Change dressing every day. Follow-up Appointments: Wound #4 Left Amputation Site - Toe: Return Appointment in 1 week. Off-Loading: Open toe surgical shoe with peg assist. 1. With regard to the issue that I am seeing at this point with likely infection I do believe the patient needs to have a referral to infectious disease. It does appear that he may require some IV antibiotics but at least an ongoing oral antibiotic. He is going to call back to the [place] office to go ahead and make that appointment. 2. With regard to the actual dressings at  this point I went to suggest that the patient go ahead and continue with the Vashe soaked gauze packed into the wound which does seem to be helping to clear away some of the necrotic tissue based on what I am seeing at this point. 3. The patient really wants to avoid any additional surgery if at all possible which I completely understand. Other than some more aggressive debridement we may need to do here in the office I am hoping to avoid any surgical interventions at this time. The patient is likewise in agreement with that. 4. He will continue with the open toe surgical shoe for offloading. We will see patient back for reevaluation in 1 week here in the clinic. If anything worsens or changes patient will contact our office for additional recommendations. Electronic Signature(s) Signed: 07/10/2019 3:39:31 PM By: Worthy Keeler  PA-C Entered By: Worthy Keeler on 07/10/2019 15:39:30 Speedy, Somtochukwu A. ([id number]) -------------------------------------------------------------------------------- ROS/PFSH Details Patient Name: Scott Mount A. Date of Service: 07/10/2019 2:45 PM Medical Record Number: [id number] Patient Account Number: [id number] Date of Birth/Sex: 1961/02/24 (58 y.o. M) Treating RN: Harold Barban Primary Care Provider: Norville Haggard Other Clinician: Referring Provider: Norville Haggard Treating Provider/Extender: STONE III, Eiza Canniff Weeks in Treatment: 4 Label Progress Note Print Version as History and Physical for this encounter Information Obtained From Patient Constitutional Symptoms (General Health) Complaints and Symptoms: Negative for: Fatigue; Fever; Chills; Marked Weight Change Respiratory Complaints and Symptoms: Negative for: Chronic or frequent coughs; Shortness of Breath Medical History: Negative for: Aspiration; Asthma; Chronic Obstructive Pulmonary Disease (COPD); Pneumothorax; Sleep Apnea; Tuberculosis Cardiovascular Complaints and Symptoms: Negative for: Chest pain; LE edema Medical History: Positive for: Congestive Heart Failure; Coronary Artery Disease; Hypertension; Peripheral Venous Disease Negative for: Angina; Arrhythmia; Deep Vein Thrombosis; Hypotension; Myocardial Infarction; Peripheral Arterial Disease; Phlebitis; Vasculitis Past Medical History Notes: HLD Psychiatric Complaints and Symptoms: Negative for: Anxiety; Claustrophobia Medical History: Positive for: Confinement Anxiety Negative for: Anorexia/bulimia Eyes Medical History: Negative for: Cataracts; Glaucoma; Optic Neuritis Ear/Nose/Mouth/Throat Medical History: Negative for: Chronic sinus problems/congestion; Middle ear problems Hematologic/Lymphatic [name], [name] ([id number]) Medical History: Negative for: Anemia; Hemophilia; Human Immunodeficiency Virus; Lymphedema; Sickle Cell  Disease Gastrointestinal Medical History: Negative for: Cirrhosis ; Colitis; Crohnos; Hepatitis A; Hepatitis B; Hepatitis C Endocrine Medical History: Positive for: Type II Diabetes Negative for: Type I Diabetes Time with diabetes: 61yrs Treated with: Insulin, Oral agents Blood sugar tested every day: Yes Tested : twice daily Blood sugar testing results: Breakfast: 200's Genitourinary Medical History: Positive for: End Stage Renal Disease - ESRD on PD Immunological Medical History: Negative for: Lupus Erythematosus; Raynaudos; Scleroderma Integumentary (Skin) Medical History: Negative for: History of Burn; History of pressure wounds Musculoskeletal Medical History: Positive for: Osteoarthritis Negative for: Gout; Rheumatoid Arthritis; Osteomyelitis Neurologic Medical History: Positive for: Neuropathy Negative for: Dementia; Quadriplegia; Paraplegia; Seizure Disorder Oncologic Medical History: Negative for: Received Chemotherapy; Received Radiation Immunizations Pneumococcal Vaccine: Received Pneumococcal Vaccination: Yes Tetanus Vaccine: Last tetanus shot: 07/03/2013 Implantable Devices [name], [name] ([id number]) None Hospitalization / Surgery History Type of Hospitalization/Surgery Infection of toe/Amputation Family and Social History Cancer: Yes - Father; Diabetes: Yes - Siblings; Heart Disease: No; Hereditary Spherocytosis: No; Hypertension: No; Kidney Disease: No; Lung Disease: No; Seizures: No; Stroke: No; Thyroid Problems: No; Tuberculosis: No; Current every day smoker; Marital Status - Single; Alcohol Use: Never; Drug Use: No History; Caffeine Use: Daily - coffee, tea; Financial Concerns: No; Food, Clothing or Shelter Needs: No; Support System Lacking: No; Transportation Concerns: No Physician Affirmation I have  reviewed and agree with the above information. Electronic Signature(s) Signed: 07/10/2019 4:23:10 PM By: Harold Barban Signed: 07/10/2019  4:43:56 PM By: Worthy Keeler PA-C Entered By: Worthy Keeler on 07/10/2019 15:37:09 [name], [name] ([id number]) -------------------------------------------------------------------------------- SuperBill Details Patient Name: Scott Mount A. Date of Service: 07/10/2019 Medical Record Number: [id number] Patient Account Number: [id number] Date of Birth/Sex: July 09, 1961 (58 y.o. M) Treating RN: Harold Barban Primary Care Provider: Norville Haggard Other Clinician: Referring Provider: [facility] Treating Provider/Extender: STONE III, Casper Pagliuca Weeks in Treatment: 4 Diagnosis Coding ICD-10 Codes Code Description E11.621 Type 2 diabetes mellitus with foot ulcer T81.31XA Disruption of external operation (surgical) wound, not elsewhere classified, initial encounter L97.522 Non-pressure chronic ulcer of other part of left foot with fat layer exposed N18.6 End stage renal disease Z99.2 Dependence on renal dialysis I10 Essential (primary) hypertension Facility Procedures CPT4 Code: [id number] Description: 99213 - WOUND CARE VISIT-LEV 3 EST PT Modifier: Quantity: 1 Physician Procedures CPT4: Description Modifier Quantity Code [id number] - WC PHYS LEVEL 4 - EST PT 1 ICD-10 Diagnosis Description E11.621 Type 2 diabetes mellitus with foot ulcer T81.31XA Disruption of external operation (surgical) wound, not elsewhere classified,  initial encounter L97.522 Non-pressure chronic ulcer of other part of left foot with fat layer exposed N18.6 End stage renal disease Electronic Signature(s) Signed: 07/10/2019 3:39:47 PM By: Worthy Keeler PA-C Entered By: Worthy Keeler on 07/10/2019 15:39:47

## 2019-07-10 NOTE — Progress Notes (Addendum)
Scott [name], Scott [name] ([id number]) Visit Report for 07/10/2019 Arrival Information Details Patient Name: Scott [name], [name] Date of Service: 07/10/2019 2:45 PM Medical Record Number: [id number] Patient Account Number: [id number] Date of Birth/Sex: 1961/03/21 (58 y.o. M) Treating RN: Harold Barban Primary Care Crystallee Werden: Norville Haggard Other Clinician: Referring Ivelis Norgard: [facility] Treating Adelaine Roppolo/Extender: STONE III, HOYT Weeks in Treatment: 4 Visit Information History Since Last Visit Added or deleted any medications: No Patient Arrived: Ambulatory Any new allergies or adverse reactions: No Arrival Time: 14:46 Had a fall or experienced change in No Accompanied By: self activities of daily living that may affect Transfer Assistance: None risk of falls: Patient Identification Verified: Yes Signs or symptoms of abuse/neglect since last visito No Secondary Verification Process Yes Hospitalized since last visit: No Completed: Implantable device outside of the clinic excluding No Patient Has Alerts: Yes cellular tissue based products placed in the center Patient Alerts: Patient on Blood since last visit: Thinner Has Dressing in Place as Prescribed: Yes Effient Pain Present Now: Yes aspirin 81 DMII Electronic Signature(s) Signed: 07/10/2019 3:48:00 PM By: Lorine Bears RCP, RRT, CHT Entered By: Lorine Bears on 07/10/2019 14:46:47 Scott [name], [name] ([id number]) -------------------------------------------------------------------------------- Clinic Level of Care Assessment Details Patient Name: Scott [name], [name] Date of Service: 07/10/2019 2:45 PM Medical Record Number: [id number] Patient Account Number: [id number] Date of Birth/Sex: [birth year] (58 y.o. M) Treating RN: Harold Barban Primary Care Mitchelle Sultan: Norville Haggard Other Clinician: Referring Tysin Salada: [facility] Treating Cadden Elizondo/Extender: STONE III, HOYT Weeks in Treatment: 4 Clinic Level  of Care Assessment Items TOOL 4 Quantity Score []  - Use when only an EandM is performed on FOLLOW-UP visit 0 ASSESSMENTS - Nursing Assessment / Reassessment X - Reassessment of Co-morbidities (includes updates in patient status) 1 10 X- 1 5 Reassessment of Adherence to Treatment Plan ASSESSMENTS - Wound and Skin Assessment / Reassessment X - Simple Wound Assessment / Reassessment - one wound 1 5 []  - 0 Complex Wound Assessment / Reassessment - multiple wounds []  - 0 Dermatologic / Skin Assessment (not related to wound area) ASSESSMENTS - Focused Assessment []  - Circumferential Edema Measurements - multi extremities 0 []  - 0 Nutritional Assessment / Counseling / Intervention []  - 0 Lower Extremity Assessment (monofilament, tuning fork, pulses) []  - 0 Peripheral Arterial Disease Assessment (using hand held doppler) ASSESSMENTS - Ostomy and/or Continence Assessment and Care []  - Incontinence Assessment and Management 0 []  - 0 Ostomy Care Assessment and Management (repouching, etc.) PROCESS - Coordination of Care X - Simple Patient / Family Education for ongoing care 1 15 []  - 0 Complex (extensive) Patient / Family Education for ongoing care []  - 0 Staff obtains Programmer, systems, Records, Test Results / Process Orders []  - 0 Staff telephones HHA, Nursing Homes / Clarify orders / etc []  - 0 Routine Transfer to another Facility (non-emergent condition) []  - 0 Routine Hospital Admission (non-emergent condition) []  - 0 New Admissions / Biomedical engineer / Ordering NPWT, Apligraf, etc. []  - 0 Emergency Hospital Admission (emergent condition) X- 1 10 Simple Discharge Coordination Scott [name], [name] ([id number]) []  - 0 Complex (extensive) Discharge Coordination PROCESS - Special Needs []  - Pediatric / Minor Patient Management 0 []  - 0 Isolation Patient Management []  - 0 Hearing / Language / Visual special needs []  - 0 Assessment of Community assistance (transportation, D/C  planning, etc.) []  - 0 Additional assistance / Altered mentation []  - 0 Support Surface(s) Assessment (bed, cushion, seat, etc.) INTERVENTIONS - Wound Cleansing / Measurement X - Simple Wound Cleansing -  one wound 1 5 []  - 0 Complex Wound Cleansing - multiple wounds X- 1 5 Wound Imaging (photographs - any number of wounds) []  - 0 Wound Tracing (instead of photographs) X- 1 5 Simple Wound Measurement - one wound []  - 0 Complex Wound Measurement - multiple wounds INTERVENTIONS - Wound Dressings []  - Small Wound Dressing one or multiple wounds 0 X- 1 15 Medium Wound Dressing one or multiple wounds []  - 0 Large Wound Dressing one or multiple wounds []  - 0 Application of Medications - topical []  - 0 Application of Medications - injection INTERVENTIONS - Miscellaneous []  - External ear exam 0 []  - 0 Specimen Collection (cultures, biopsies, blood, body fluids, etc.) []  - 0 Specimen(s) / Culture(s) sent or taken to Lab for analysis []  - 0 Patient Transfer (multiple staff / Civil Service fast streamer / Similar devices) []  - 0 Simple Staple / Suture removal (25 or less) []  - 0 Complex Staple / Suture removal (26 or more) []  - 0 Hypo / Hyperglycemic Management (close monitor of Blood Glucose) []  - 0 Ankle / Brachial Index (ABI) - do not check if billed separately X- 1 5 Vital Signs Scott [name], [name] ([id number]) Has the patient been seen at the hospital within the last three years: Yes Total Score: 80 Level Of Care: New/Established - Level 3 Electronic Signature(s) Signed: 07/10/2019 4:23:10 PM By: Harold Barban Entered By: Harold Barban on 07/10/2019 15:12:05 Scott [name], [name] ([id number]) -------------------------------------------------------------------------------- Encounter Discharge Information Details Patient Name: Scott [name], [name] Date of Service: 07/10/2019 2:45 PM Medical Record Number: [id number] Patient Account Number: [id number] Date of Birth/Sex: [birth year] (58 y.o.  M) Treating RN: Harold Barban Primary Care Ernestene Coover: Norville Haggard Other Clinician: Referring Yamira Papa: Norville Haggard Treating Makenzey Nanni/Extender: Melburn Hake, HOYT Weeks in Treatment: 4 Encounter Discharge Information Items Discharge Condition: Stable Ambulatory Status: Ambulatory Discharge Destination: Home Transportation: Private Auto Accompanied By: family Schedule Follow-up Appointment: Yes Clinical Summary of Care: Electronic Signature(s) Signed: 07/10/2019 4:23:10 PM By: Harold Barban Entered By: Harold Barban on 07/10/2019 15:17:29 Amores, Joanthony A. ([id number]) -------------------------------------------------------------------------------- Lower Extremity Assessment Details Patient Name: Strawder, Salik A. Date of Service: 07/10/2019 2:45 PM Medical Record Number: [id number] Patient Account Number: [id number] Date of Birth/Sex: [birth year] (58 y.o. M) Treating RN: Cornell Barman Primary Care Jestine Bicknell: Norville Haggard Other Clinician: Referring Adylee Leonardo: Norville Haggard Treating Alyzza Andringa/Extender: STONE III, HOYT Weeks in Treatment: 4 Vascular Assessment Pulses: Dorsalis Pedis Palpable: [Left:No] Doppler Audible: [Left:Yes] Posterior Tibial Palpable: [Left:No Yes] Electronic Signature(s) Signed: 07/11/2019 9:44:27 AM By: Gretta Cool, BSN, RN, CWS, Kim RN, BSN Entered By: Gretta Cool, BSN, RN, CWS, Kim on 07/10/2019 15:01:14 Binns, Issiac A. ([id number]) -------------------------------------------------------------------------------- Multi Wound Chart Details Patient Name: Harp, Evangelos A. Date of Service: 07/10/2019 2:45 PM Medical Record Number: [id number] Patient Account Number: [id number] Date of Birth/Sex: 06-21-1961 (58 y.o. M) Treating RN: Harold Barban Primary Care Emalynn Clewis: Norville Haggard Other Clinician: Referring Chares Slaymaker: [facility] Treating Jianni Batten/Extender: STONE III, HOYT Weeks in Treatment: 4 Vital Signs Height(in): 69 Pulse(bpm): 85 Weight(lbs): 215 Blood  Pressure(mmHg): 132/44 Body Mass Index(BMI): 32 Temperature(F): 98.0 Respiratory Rate 18 (breaths/min): Photos: [N/A:N/A] Wound Location: Left Amputation Site - Toe N/A N/A Wounding Event: Surgical Injury N/A N/A Primary Etiology: Open Surgical Wound N/A N/A Secondary Etiology: Diabetic Wound/Ulcer of the N/A N/A Lower Extremity Comorbid History: Congestive Heart Failure, N/A N/A Coronary Artery Disease, Hypertension, Peripheral Venous Disease, Type II Diabetes, End Stage Renal Disease, Osteoarthritis, Neuropathy, Confinement Anxiety Date Acquired: 05/24/2019 N/A N/A Weeks of Treatment: 4 N/A N/A Wound Status: Open N/A  N/A Pending Amputation on Yes N/A N/A Presentation: Measurements L x W x D 9x3.3x1.8 N/A N/A (cm) Area (cm) : 23.326 N/A N/A Volume (cm) : 41.987 N/A N/A % Reduction in Area: -41.40% N/A N/A % Reduction in Volume: -2446.20% N/A N/A Classification: Full Thickness With Exposed N/A N/A Support Structures Exudate Amount: Medium N/A N/A Exudate Type: Purulent N/A N/A Exudate Color: yellow, brown, green N/A N/A Beane, Jakari A. ([id number]) Foul Odor After Cleansing: Yes N/A N/A Odor Anticipated Due to No N/A N/A Product Use: Wound Margin: Distinct, outline attached N/A N/A Granulation Amount: Small (1-33%) N/A N/A Necrotic Amount: Large (67-100%) N/A N/A Necrotic Tissue: Eschar, Adherent Slough N/A N/A Exposed Structures: Fat Layer (Subcutaneous N/A N/A Tissue) Exposed: Yes Tendon: Yes Muscle: Yes Bone: Yes Fascia: No Joint: No Epithelialization: None N/A N/A Treatment Notes Electronic Signature(s) Signed: 07/10/2019 4:23:10 PM By: Harold Barban Entered By: Harold Barban on 07/10/2019 15:06:56 Scott [name], [name] ([id number]) -------------------------------------------------------------------------------- [place] Details Patient Name: [name] Date of Service: 07/10/2019 2:45 PM Medical Record Number:  [id number] Patient Account Number: [id number] Date of Birth/Sex: [birth year] (58 y.o. M) Treating RN: Harold Barban Primary Care Duff Pozzi: Norville Haggard Other Clinician: Referring Toye Rouillard: [facility] Treating Breyona Swander/Extender: STONE III, HOYT Weeks in Treatment: 4 Active Inactive Necrotic Tissue Nursing Diagnoses: Impaired tissue integrity related to necrotic/devitalized tissue Goals: Necrotic/devitalized tissue will be minimized in the wound bed Date Initiated: 06/12/2019 Target Resolution Date: 07/13/2019 Goal Status: Active Patient/caregiver will verbalize understanding of reason and process for debridement of necrotic tissue Date Initiated: 06/12/2019 Target Resolution Date: 06/12/2019 Goal Status: Active Interventions: Assess patient pain level pre-, during and post procedure and prior to discharge Provide education on necrotic tissue and debridement process Treatment Activities: Apply topical anesthetic as ordered : 06/12/2019 Notes: Wound/Skin Impairment Nursing Diagnoses: Impaired tissue integrity Knowledge deficit related to ulceration/compromised skin integrity Goals: Ulcer/skin breakdown will have a volume reduction of 30% by week 4 Date Initiated: 06/12/2019 Target Resolution Date: 07/13/2019 Goal Status: Active Interventions: Assess patient/caregiver ability to obtain necessary supplies Assess patient/caregiver ability to perform ulcer/skin care regimen upon admission and as needed Assess ulceration(s) every visit Notes: Electronic Signature(s) Scott [name], [name] ([id number]) Signed: 07/10/2019 4:23:10 PM By: Harold Barban Entered By: Harold Barban on 07/10/2019 15:06:44 Scott [name], Scott Store Marina. ([id number]) -------------------------------------------------------------------------------- Pain Assessment Details Patient Name: Topor, Lue A. Date of Service: 07/10/2019 2:45 PM Medical Record Number: [id number] Patient Account Number: [id number] Date of  Birth/Sex: [birth year] (58 y.o. M) Treating RN: Harold Barban Primary Care Shaunice Levitan: Norville Haggard Other Clinician: Referring Loghan Subia: [facility] Treating Milik Gilreath/Extender: STONE III, HOYT Weeks in Treatment: 4 Active Problems Location of Pain Severity and Description of Pain Patient Has Paino Yes Site Locations Rate the pain. Current Pain Level: 4 Pain Management and Medication Current Pain Management: Electronic Signature(s) Signed: 07/10/2019 3:48:00 PM By: Lorine Bears RCP, RRT, CHT Signed: 07/10/2019 4:23:10 PM By: Harold Barban Entered By: Lorine Bears on 07/10/2019 14:46:58 Scott [name], [name] ([id number]) -------------------------------------------------------------------------------- Patient/Caregiver Education Details Patient Name: [name] Date of Service: 07/10/2019 2:45 PM Medical Record Number: [id number] Patient Account Number: [id number] Date of Birth/Gender: [birth year] (58 y.o. M) Treating RN: Harold Barban Primary Care Physician: Norville Haggard Other Clinician: Referring Physician: Norville Haggard Treating Physician/Extender: Melburn Hake, HOYT Weeks in Treatment: 4 Education Assessment Education Provided To: Patient Education Topics Provided Wound/Skin Impairment: Handouts: Caring for Your Ulcer Methods: Demonstration, Explain/Verbal Responses: State content correctly Electronic Signature(s) Signed: 07/10/2019 4:23:10 PM By: Harold Barban Entered By: Harold Barban  on 07/10/2019 15:07:12 Soberanis, Rain A. ([id number]) -------------------------------------------------------------------------------- Wound Assessment Details Patient Name: Soave, Dashawn A. Date of Service: 07/10/2019 2:45 PM Medical Record Number: [id number] Patient Account Number: [id number] Date of Birth/Sex: 1961-08-18 (58 y.o. M) Treating RN: Cornell Barman Primary Care Zi Newbury: Norville Haggard Other Clinician: Referring Jacy Brocker: [facility] Treating Telesa Jeancharles/Extender: STONE III, HOYT Weeks in Treatment: 4 Wound Status Wound Number: 4 Primary Open Surgical Wound Etiology: Wound Location: Left Amputation Site - Toe Secondary Diabetic Wound/Ulcer of the Lower Extremity Wounding Event: Surgical Injury Etiology: Date Acquired: 05/24/2019 Wound Open Weeks Of Treatment: 4 Status: Clustered Wound: No Comorbid Congestive Heart Failure, Coronary Artery Pending Amputation On Presentation History: Disease, Hypertension, Peripheral Venous Disease, Type II Diabetes, End Stage Renal Disease, Osteoarthritis, Neuropathy, Confinement Anxiety Photos Wound Measurements Length: (cm) 9 Width: (cm) 3.3 Depth: (cm) 1.8 Area: (cm) 23.326 Volume: (cm) 41.987 % Reduction in Area: -41.4% % Reduction in Volume: -2446.2% Epithelialization: None Tunneling: No Undermining: No Wound Description Full Thickness With Exposed Support Foul Odor A Classification: Structures Due to Prod Wound Margin: Distinct, outline attached Slough/Fibr Exudate Medium Amount: Exudate Type: Purulent Exudate Color: yellow, brown, green fter Cleansing: Yes uct Use: No ino Yes Wound Bed Granulation Amount: Small (1-33%) Exposed Structure Necrotic Amount: Large (67-100%) Fascia Exposed: No Necrotic Quality: Eschar, Adherent Slough Fat Layer (Subcutaneous Tissue) Exposed: Yes Tendon Exposed: Yes Espina, Jamere A. ([id number]) Muscle Exposed: Yes Necrosis of Muscle: Yes Joint Exposed: No Bone Exposed: Yes Treatment Notes Wound #4 (Left Amputation Site - Toe) Notes Dakins, dry gauze, ABD, Conform Electronic Signature(s) Signed: 07/11/2019 9:44:27 AM By: Gretta Cool, BSN, RN, CWS, Kim RN, BSN Entered By: Gretta Cool, BSN, RN, CWS, Kim on 07/10/2019 14:57:27 Robinson, Dantonio A. ([id number]) -------------------------------------------------------------------------------- Vitals Details Patient Name: Scott [name], Bayler A. Date of Service: 07/10/2019 2:45  PM Medical Record Number: [id number] Patient Account Number: [id number] Date of Birth/Sex: 21-Apr-1961 (58 y.o. M) Treating RN: Harold Barban Primary Care Seaira Byus: Norville Haggard Other Clinician: Referring Demareon Coldwell: [facility] Treating Tyler Cubit/Extender: STONE III, HOYT Weeks in Treatment: 4 Vital Signs Time Taken: 14:48 Temperature (F): 98.0 Height (in): 69 Pulse (bpm): 85 Weight (lbs): 215 Respiratory Rate (breaths/min): 18 Body Mass Index (BMI): 31.7 Blood Pressure (mmHg): 132/44 Reference Range: 80 - 120 mg / dl Electronic Signature(s) Signed: 07/11/2019 9:44:27 AM By: Gretta Cool, BSN, RN, CWS, Kim RN, BSN Entered By: Gretta Cool, BSN, RN, CWS, Kim on 07/10/2019 14:50:34

## 2019-07-11 ENCOUNTER — Other Ambulatory Visit: Payer: Self-pay

## 2019-07-11 ENCOUNTER — Ambulatory Visit: Payer: Medicare Other | Admitting: Urology

## 2019-07-11 DIAGNOSIS — R3129 Other microscopic hematuria: Secondary | ICD-10-CM

## 2019-07-12 ENCOUNTER — Ambulatory Visit
Admission: RE | Admit: 2019-07-12 | Discharge: 2019-07-12 | Disposition: A | Discharge status: Home / Self Care | Payer: Medicare Other | Source: Ambulatory Visit | Attending: Cardiology | Admitting: Cardiology

## 2019-07-12 ENCOUNTER — Other Ambulatory Visit: Payer: Self-pay

## 2019-07-12 DIAGNOSIS — R0600 Dyspnea, unspecified: Secondary | ICD-10-CM | POA: Diagnosis not present

## 2019-07-12 DIAGNOSIS — I2510 Atherosclerotic heart disease of native coronary artery without angina pectoris: Secondary | ICD-10-CM | POA: Insufficient documentation

## 2019-07-12 DIAGNOSIS — R0609 Other forms of dyspnea: Secondary | ICD-10-CM

## 2019-07-12 LAB — NM MYOCAR MULTI W/SPECT W/WALL MOTION / EF
Estimated workload: 1 METS
Exercise duration (min): 0 min
Exercise duration (sec): 0 s
LV dias vol: 322 mL (ref 62–150)
LV sys vol: 257 mL
MPHR: 160 {beats}/min
Peak HR: 86 {beats}/min
Percent HR: 53 %
Rest HR: 78 {beats}/min
SDS: 1
SRS: 24
SSS: 22
TID: 1.02

## 2019-07-12 MED ORDER — TECHNETIUM TC 99M TETROFOSMIN IV KIT
10.0000 | PACK | Freq: Once | INTRAVENOUS | Status: AC | PRN
  Administered 2019-07-12: 10.89 via INTRAVENOUS

## 2019-07-12 MED ORDER — REGADENOSON 0.4 MG/5ML IV SOLN
0.4000 mg | Freq: Once | INTRAVENOUS | Status: AC
  Administered 2019-07-12: 10:00:00 0.4 mg via INTRAVENOUS

## 2019-07-12 MED ORDER — TECHNETIUM TC 99M TETROFOSMIN IV KIT
32.1900 | PACK | Freq: Once | INTRAVENOUS | Status: AC | PRN
  Administered 2019-07-12: 10:00:00 32.19 via INTRAVENOUS

## 2019-07-13 NOTE — Telephone Encounter (Signed)
Patient returned call-Informed patient-changed appointment to virtual visit. Patient aware of time and details. Verbalized understanding.

## 2019-07-14 ENCOUNTER — Other Ambulatory Visit
Admission: RE | Admit: 2019-07-14 | Discharge: 2019-07-14 | Disposition: A | Discharge status: Home / Self Care | Payer: Commercial Managed Care - HMO | Source: Ambulatory Visit | Attending: Vascular Surgery | Admitting: Vascular Surgery

## 2019-07-14 ENCOUNTER — Telehealth (INDEPENDENT_AMBULATORY_CARE_PROVIDER_SITE_OTHER): Payer: Self-pay | Admitting: Urology

## 2019-07-14 ENCOUNTER — Telehealth: Payer: Self-pay | Admitting: Cardiology

## 2019-07-14 ENCOUNTER — Other Ambulatory Visit: Payer: Self-pay

## 2019-07-14 ENCOUNTER — Other Ambulatory Visit: Payer: Commercial Managed Care - HMO

## 2019-07-14 ENCOUNTER — Other Ambulatory Visit: Payer: Self-pay | Admitting: Radiology

## 2019-07-14 DIAGNOSIS — Z87891 Personal history of nicotine dependence: Secondary | ICD-10-CM

## 2019-07-14 DIAGNOSIS — Z01812 Encounter for preprocedural laboratory examination: Secondary | ICD-10-CM | POA: Insufficient documentation

## 2019-07-14 DIAGNOSIS — R3129 Other microscopic hematuria: Secondary | ICD-10-CM

## 2019-07-14 DIAGNOSIS — I739 Peripheral vascular disease, unspecified: Secondary | ICD-10-CM | POA: Diagnosis not present

## 2019-07-14 DIAGNOSIS — E1151 Type 2 diabetes mellitus with diabetic peripheral angiopathy without gangrene: Secondary | ICD-10-CM | POA: Diagnosis not present

## 2019-07-14 DIAGNOSIS — Z20828 Contact with and (suspected) exposure to other viral communicable diseases: Secondary | ICD-10-CM | POA: Insufficient documentation

## 2019-07-14 DIAGNOSIS — N1330 Unspecified hydronephrosis: Secondary | ICD-10-CM

## 2019-07-14 NOTE — Progress Notes (Signed)
Virtual Visit via Telephone Note  I connected with Scott [name] on 07/14/19 at 10:00 AM EST by telephone and verified that I am speaking with the correct person using two identifiers.  Location: Patient: Home Provider: Office   I discussed the limitations, risks, security and privacy concerns of performing an evaluation and management service by telephone and the availability of in person appointments. I also discussed with the patient that there may be a patient responsible charge related to this service. The patient expressed understanding and agreed to proceed.   History of Present Illness: 58 year old male with a personal of end-stage renal disease on peritoneal dialysis with an episode of right flank pain and hydroureteronephrosis without obvious stone.  Initially, we had discussed proceeding with CT urogram for further evaluation as well as cystoscopy.  Received a message from Dr. Juleen China who would prefer the patient not receiving IV contrast as this would limit his ability to continue to receive peritoneal dialysis.  Virtual visit today discussed alternatives for further evaluation of the above findings.  He denies any ongoing flank pain.  He does have risk factors for upper tract urothelial cancer including extensive smoking history.  Please see previous notes for additional details.   Observations/Objective: Pleasant, asking appropriate questions  Assessment and Plan:   Follow Up Instructions:  1. Hydronephrosis, unspecified hydronephrosis type Right sided hydroureteronephrosis of unclear etiology  Given inability to evaluate with CT urogram, I have recommended that we proceed to the operating room for cystoscopy, bilateral retrograde pyelogram, possible right ureteroscopy with intervention, biopsy stent as needed.  He is agreeable this plan.  We discussed the risk of the procedure including risk of bleeding, infection, damage surrounding structures amongst others.   All questions were answered.  2. Former smoker Risk factor as above  3. Microscopic hematuria As above    I discussed the assessment and treatment plan with the patient. The patient was provided an opportunity to ask questions and all were answered. The patient agreed with the plan and demonstrated an understanding of the instructions.   The patient was advised to call back or seek an in-person evaluation if the symptoms worsen or if the condition fails to improve as anticipated.  I provided 8 minutes of non-face-to-face time during this encounter.   Hollice Espy, MD

## 2019-07-14 NOTE — Telephone Encounter (Signed)
   Primary Cardiologist: Kate Sable, MD  Chart reviewed as part of pre-operative protocol coverage. Patient was contacted 07/14/2019 in reference to pre-operative risk assessment for pending surgery as outlined below.  LIBAN GUEDES was last seen on Dr. Garen Lah on 06/23/19. The patient has h/o CAD with NSTEMI s/p PCI, LV dysfunction with EF 35-40%, ESRD, PAD, GERD, HTN, DM. Recently seen in the office with dyspnea. Stress test was intermediate risk with severely reduced LV function <30%. The plan was for echocardiogram which does not appear to be scheduled until 08/02/19. Will route clearance request to MD for input on whether clearance needs to be delayed until studies are complete as well as input on antiplatelet therapy. Dr. Garen Lah, Please route response to [facility] PREOP (the pre-op pool). Thank you.  Charlie Pitter, PA-C 07/14/2019, 4:33 PM

## 2019-07-14 NOTE — Telephone Encounter (Signed)
° °  [facility] Pre-operative Risk Assessment    Request for surgical clearance:  1. What type of surgery is being performed? Cystoscopy, B retrograde pyelogram, possible ureteroscopy with intervention / biopsy/ stent placement    2. When is this surgery scheduled? 07/31/19   3. What type of clearance is required (medical clearance vs. Pharmacy clearance to hold med vs. Both)? both   4. Are there any medications that need to be held prior to surgery and how long? Hold aspirin & plavix x 7 days prior to surgery    5. Practice name and name of physician pecvrforming surgery? [place], Dr Hollice Espy   6. What is your office phone number [phone]    7.   What is your office fax number [phone]  8.   Anesthesia type (None, local, MAC, general) ? Not listed    Scott [name] 07/14/2019, 3:41 PM  _________________________________________________________________   (provider comments below)

## 2019-07-15 LAB — SARS CORONAVIRUS 2 (TAT 6-24 HRS): SARS Coronavirus 2: NEGATIVE

## 2019-07-17 ENCOUNTER — Other Ambulatory Visit (INDEPENDENT_AMBULATORY_CARE_PROVIDER_SITE_OTHER): Payer: Self-pay | Admitting: Nurse Practitioner

## 2019-07-17 ENCOUNTER — Encounter: Payer: Self-pay | Admitting: Emergency Medicine

## 2019-07-17 ENCOUNTER — Inpatient Hospital Stay
Admission: EM | Admit: 2019-07-17 | Discharge: 2019-07-21 | DRG: 270 | Disposition: A | Discharge status: Home Health | Payer: Medicare Other | Source: Ambulatory Visit | Attending: Internal Medicine | Admitting: Internal Medicine

## 2019-07-17 ENCOUNTER — Other Ambulatory Visit: Payer: Self-pay

## 2019-07-17 ENCOUNTER — Ambulatory Visit: Payer: Commercial Managed Care - HMO | Admitting: Physician Assistant

## 2019-07-17 ENCOUNTER — Emergency Department: Payer: Medicare Other

## 2019-07-17 DIAGNOSIS — E1129 Type 2 diabetes mellitus with other diabetic kidney complication: Secondary | ICD-10-CM | POA: Diagnosis not present

## 2019-07-17 DIAGNOSIS — Z87891 Personal history of nicotine dependence: Secondary | ICD-10-CM

## 2019-07-17 DIAGNOSIS — E1151 Type 2 diabetes mellitus with diabetic peripheral angiopathy without gangrene: Secondary | ICD-10-CM | POA: Diagnosis present

## 2019-07-17 DIAGNOSIS — E11621 Type 2 diabetes mellitus with foot ulcer: Secondary | ICD-10-CM | POA: Diagnosis present

## 2019-07-17 DIAGNOSIS — Z95820 Peripheral vascular angioplasty status with implants and grafts: Secondary | ICD-10-CM | POA: Diagnosis not present

## 2019-07-17 DIAGNOSIS — M869 Osteomyelitis, unspecified: Secondary | ICD-10-CM | POA: Diagnosis present

## 2019-07-17 DIAGNOSIS — E11319 Type 2 diabetes mellitus with unspecified diabetic retinopathy without macular edema: Secondary | ICD-10-CM | POA: Diagnosis present

## 2019-07-17 DIAGNOSIS — Z955 Presence of coronary angioplasty implant and graft: Secondary | ICD-10-CM | POA: Diagnosis not present

## 2019-07-17 DIAGNOSIS — E7849 Other hyperlipidemia: Secondary | ICD-10-CM | POA: Diagnosis present

## 2019-07-17 DIAGNOSIS — E1143 Type 2 diabetes mellitus with diabetic autonomic (poly)neuropathy: Secondary | ICD-10-CM | POA: Diagnosis present

## 2019-07-17 DIAGNOSIS — Z7902 Long term (current) use of antithrombotics/antiplatelets: Secondary | ICD-10-CM

## 2019-07-17 DIAGNOSIS — L97909 Non-pressure chronic ulcer of unspecified part of unspecified lower leg with unspecified severity: Secondary | ICD-10-CM | POA: Diagnosis not present

## 2019-07-17 DIAGNOSIS — N2581 Secondary hyperparathyroidism of renal origin: Secondary | ICD-10-CM | POA: Diagnosis present

## 2019-07-17 DIAGNOSIS — I2510 Atherosclerotic heart disease of native coronary artery without angina pectoris: Secondary | ICD-10-CM | POA: Diagnosis present

## 2019-07-17 DIAGNOSIS — Z20828 Contact with and (suspected) exposure to other viral communicable diseases: Secondary | ICD-10-CM | POA: Diagnosis present

## 2019-07-17 DIAGNOSIS — T8754 Necrosis of amputation stump, left lower extremity: Secondary | ICD-10-CM | POA: Diagnosis present

## 2019-07-17 DIAGNOSIS — Z833 Family history of diabetes mellitus: Secondary | ICD-10-CM

## 2019-07-17 DIAGNOSIS — Z79899 Other long term (current) drug therapy: Secondary | ICD-10-CM

## 2019-07-17 DIAGNOSIS — K3184 Gastroparesis: Secondary | ICD-10-CM | POA: Diagnosis present

## 2019-07-17 DIAGNOSIS — I5022 Chronic systolic (congestive) heart failure: Secondary | ICD-10-CM | POA: Diagnosis present

## 2019-07-17 DIAGNOSIS — E119 Type 2 diabetes mellitus without complications: Secondary | ICD-10-CM

## 2019-07-17 DIAGNOSIS — Z992 Dependence on renal dialysis: Secondary | ICD-10-CM | POA: Diagnosis not present

## 2019-07-17 DIAGNOSIS — L97513 Non-pressure chronic ulcer of other part of right foot with necrosis of muscle: Secondary | ICD-10-CM | POA: Diagnosis present

## 2019-07-17 DIAGNOSIS — K219 Gastro-esophageal reflux disease without esophagitis: Secondary | ICD-10-CM | POA: Diagnosis present

## 2019-07-17 DIAGNOSIS — L97913 Non-pressure chronic ulcer of unspecified part of right lower leg with necrosis of muscle: Secondary | ICD-10-CM | POA: Diagnosis present

## 2019-07-17 DIAGNOSIS — E1169 Type 2 diabetes mellitus with other specified complication: Secondary | ICD-10-CM | POA: Diagnosis present

## 2019-07-17 DIAGNOSIS — Z7982 Long term (current) use of aspirin: Secondary | ICD-10-CM

## 2019-07-17 DIAGNOSIS — I152 Hypertension secondary to endocrine disorders: Secondary | ICD-10-CM | POA: Insufficient documentation

## 2019-07-17 DIAGNOSIS — N186 End stage renal disease: Secondary | ICD-10-CM | POA: Diagnosis present

## 2019-07-17 DIAGNOSIS — I96 Gangrene, not elsewhere classified: Secondary | ICD-10-CM | POA: Diagnosis not present

## 2019-07-17 DIAGNOSIS — E785 Hyperlipidemia, unspecified: Secondary | ICD-10-CM | POA: Diagnosis not present

## 2019-07-17 DIAGNOSIS — E1152 Type 2 diabetes mellitus with diabetic peripheral angiopathy with gangrene: Secondary | ICD-10-CM | POA: Diagnosis present

## 2019-07-17 DIAGNOSIS — I70263 Atherosclerosis of native arteries of extremities with gangrene, bilateral legs: Secondary | ICD-10-CM | POA: Diagnosis present

## 2019-07-17 DIAGNOSIS — Z89412 Acquired absence of left great toe: Secondary | ICD-10-CM | POA: Diagnosis not present

## 2019-07-17 DIAGNOSIS — I739 Peripheral vascular disease, unspecified: Secondary | ICD-10-CM | POA: Diagnosis present

## 2019-07-17 DIAGNOSIS — L97524 Non-pressure chronic ulcer of other part of left foot with necrosis of bone: Secondary | ICD-10-CM | POA: Diagnosis present

## 2019-07-17 DIAGNOSIS — E1122 Type 2 diabetes mellitus with diabetic chronic kidney disease: Secondary | ICD-10-CM | POA: Diagnosis present

## 2019-07-17 DIAGNOSIS — I70299 Other atherosclerosis of native arteries of extremities, unspecified extremity: Secondary | ICD-10-CM | POA: Diagnosis not present

## 2019-07-17 DIAGNOSIS — M86172 Other acute osteomyelitis, left ankle and foot: Secondary | ICD-10-CM | POA: Diagnosis not present

## 2019-07-17 DIAGNOSIS — D631 Anemia in chronic kidney disease: Secondary | ICD-10-CM | POA: Diagnosis present

## 2019-07-17 DIAGNOSIS — Z825 Family history of asthma and other chronic lower respiratory diseases: Secondary | ICD-10-CM

## 2019-07-17 DIAGNOSIS — I70262 Atherosclerosis of native arteries of extremities with gangrene, left leg: Secondary | ICD-10-CM | POA: Diagnosis not present

## 2019-07-17 DIAGNOSIS — I132 Hypertensive heart and chronic kidney disease with heart failure and with stage 5 chronic kidney disease, or end stage renal disease: Secondary | ICD-10-CM | POA: Diagnosis present

## 2019-07-17 DIAGNOSIS — I214 Non-ST elevation (NSTEMI) myocardial infarction: Secondary | ICD-10-CM | POA: Insufficient documentation

## 2019-07-17 DIAGNOSIS — J45909 Unspecified asthma, uncomplicated: Secondary | ICD-10-CM | POA: Diagnosis present

## 2019-07-17 DIAGNOSIS — I10 Essential (primary) hypertension: Secondary | ICD-10-CM | POA: Insufficient documentation

## 2019-07-17 DIAGNOSIS — N1330 Unspecified hydronephrosis: Secondary | ICD-10-CM | POA: Diagnosis present

## 2019-07-17 DIAGNOSIS — Y835 Amputation of limb(s) as the cause of abnormal reaction of the patient, or of later complication, without mention of misadventure at the time of the procedure: Secondary | ICD-10-CM | POA: Diagnosis present

## 2019-07-17 DIAGNOSIS — Z801 Family history of malignant neoplasm of trachea, bronchus and lung: Secondary | ICD-10-CM

## 2019-07-17 DIAGNOSIS — Z794 Long term (current) use of insulin: Secondary | ICD-10-CM

## 2019-07-17 DIAGNOSIS — Z8249 Family history of ischemic heart disease and other diseases of the circulatory system: Secondary | ICD-10-CM

## 2019-07-17 LAB — CBC WITH DIFFERENTIAL/PLATELET
Abs Immature Granulocytes: 0.1 10*3/uL — ABNORMAL HIGH (ref 0.00–0.07)
Basophils Absolute: 0.1 10*3/uL (ref 0.0–0.1)
Basophils Relative: 1 %
Eosinophils Absolute: 0.3 10*3/uL (ref 0.0–0.5)
Eosinophils Relative: 3 %
HCT: 30.4 % — ABNORMAL LOW (ref 39.0–52.0)
Hemoglobin: 9.6 g/dL — ABNORMAL LOW (ref 13.0–17.0)
Immature Granulocytes: 1 %
Lymphocytes Relative: 9 %
Lymphs Abs: 1 10*3/uL (ref 0.7–4.0)
MCH: 29.9 pg (ref 26.0–34.0)
MCHC: 31.6 g/dL (ref 30.0–36.0)
MCV: 94.7 fL (ref 80.0–100.0)
Monocytes Absolute: 0.6 10*3/uL (ref 0.1–1.0)
Monocytes Relative: 6 %
Neutro Abs: 8.5 10*3/uL — ABNORMAL HIGH (ref 1.7–7.7)
Neutrophils Relative %: 80 %
Platelets: 361 10*3/uL (ref 150–400)
RBC: 3.21 MIL/uL — ABNORMAL LOW (ref 4.22–5.81)
RDW: 14.2 % (ref 11.5–15.5)
WBC: 10.6 10*3/uL — ABNORMAL HIGH (ref 4.0–10.5)
nRBC: 0 % (ref 0.0–0.2)

## 2019-07-17 LAB — PROTIME-INR
INR: 1.2 (ref 0.8–1.2)
Prothrombin Time: 14.7 seconds (ref 11.4–15.2)

## 2019-07-17 LAB — BASIC METABOLIC PANEL
Anion gap: 15 (ref 5–15)
BUN: 47 mg/dL — ABNORMAL HIGH (ref 6–20)
CO2: 22 mmol/L (ref 22–32)
Calcium: 8.1 mg/dL — ABNORMAL LOW (ref 8.9–10.3)
Chloride: 98 mmol/L (ref 98–111)
Creatinine, Ser: 7.24 mg/dL — ABNORMAL HIGH (ref 0.61–1.24)
GFR calc Af Amer: 9 mL/min — ABNORMAL LOW (ref >60)
GFR calc non Af Amer: 7 mL/min — ABNORMAL LOW (ref >60)
Glucose, Bld: 128 mg/dL — ABNORMAL HIGH (ref 70–99)
Potassium: 4.7 mmol/L (ref 3.5–5.1)
Sodium: 135 mmol/L (ref 135–145)

## 2019-07-17 LAB — HEMOGLOBIN A1C
Hgb A1c MFr Bld: 6 % — ABNORMAL HIGH (ref 4.8–5.6)
Mean Plasma Glucose: 125.5 mg/dL

## 2019-07-17 LAB — LACTIC ACID, PLASMA
Lactic Acid, Venous: 1.3 mmol/L (ref 0.5–1.9)
Lactic Acid, Venous: 1.4 mmol/L (ref 0.5–1.9)

## 2019-07-17 MED ORDER — DIALYVITE 800 0.8 MG PO TABS: 1.0000 | ORAL_TABLET | Freq: Every day | ORAL | Status: DC

## 2019-07-17 MED ORDER — VITAMIN D 25 MCG (1000 UNIT) PO TABS
2000.0000 [IU] | ORAL_TABLET | Freq: Every day | ORAL | Status: DC
  Administered 2019-07-17 – 2019-07-21 (×3): 2000 [IU] via ORAL
  Filled 2019-07-17 (×3): qty 2

## 2019-07-17 MED ORDER — CEFAZOLIN SODIUM-DEXTROSE 2-4 GM/100ML-% IV SOLN
2.0000 g | INTRAVENOUS | Status: DC
  Filled 2019-07-17: qty 100

## 2019-07-17 MED ORDER — CALCIUM ACETATE (PHOS BINDER) 667 MG PO CAPS
667.0000 mg | ORAL_CAPSULE | Freq: Three times a day (TID) | ORAL | Status: DC | PRN
  Filled 2019-07-17: qty 1

## 2019-07-17 MED ORDER — CALCIUM ACETATE (PHOS BINDER) 667 MG PO CAPS
2001.0000 mg | ORAL_CAPSULE | Freq: Three times a day (TID) | ORAL | Status: DC
  Administered 2019-07-19 – 2019-07-21 (×6): 2001 mg via ORAL
  Filled 2019-07-17 (×8): qty 3

## 2019-07-17 MED ORDER — ALBUTEROL SULFATE (2.5 MG/3ML) 0.083% IN NEBU: 2.5000 mg | INHALATION_SOLUTION | Freq: Four times a day (QID) | RESPIRATORY_TRACT | Status: DC | PRN

## 2019-07-17 MED ORDER — HYDRALAZINE HCL 25 MG PO TABS
25.0000 mg | ORAL_TABLET | Freq: Three times a day (TID) | ORAL | Status: DC
  Administered 2019-07-17: 25 mg via ORAL
  Filled 2019-07-17 (×3): qty 1

## 2019-07-17 MED ORDER — HEPARIN SODIUM (PORCINE) 5000 UNIT/ML IJ SOLN
5000.0000 [IU] | Freq: Three times a day (TID) | INTRAMUSCULAR | Status: DC
  Administered 2019-07-17 – 2019-07-18 (×2): 5000 [IU] via SUBCUTANEOUS
  Filled 2019-07-17 (×2): qty 1

## 2019-07-17 MED ORDER — NITROGLYCERIN 0.4 MG SL SUBL: 0.4000 mg | SUBLINGUAL_TABLET | SUBLINGUAL | Status: DC | PRN

## 2019-07-17 MED ORDER — SODIUM CHLORIDE 0.9% FLUSH: 3.0000 mL | INTRAVENOUS | Status: DC | PRN

## 2019-07-17 MED ORDER — TAMSULOSIN HCL 0.4 MG PO CAPS
0.4000 mg | ORAL_CAPSULE | Freq: Every day | ORAL | Status: DC
  Administered 2019-07-17 – 2019-07-21 (×4): 0.4 mg via ORAL
  Filled 2019-07-17 (×4): qty 1

## 2019-07-17 MED ORDER — CALCITRIOL 0.25 MCG PO CAPS
0.2500 ug | ORAL_CAPSULE | Freq: Every day | ORAL | Status: DC
  Administered 2019-07-17 – 2019-07-21 (×3): 0.25 ug via ORAL
  Filled 2019-07-17 (×5): qty 1

## 2019-07-17 MED ORDER — CALCIUM ACETATE (PHOS BINDER) 667 MG PO CAPS
667.0000 mg | ORAL_CAPSULE | Freq: Three times a day (TID) | ORAL | Status: DC | PRN
  Filled 2019-07-17: qty 2

## 2019-07-17 MED ORDER — FUROSEMIDE 40 MG PO TABS
80.0000 mg | ORAL_TABLET | Freq: Every day | ORAL | Status: DC
  Administered 2019-07-17 – 2019-07-21 (×3): 80 mg via ORAL
  Filled 2019-07-17 (×4): qty 2

## 2019-07-17 MED ORDER — SODIUM CHLORIDE 0.9% FLUSH
3.0000 mL | Freq: Two times a day (BID) | INTRAVENOUS | Status: DC
  Administered 2019-07-17 – 2019-07-21 (×7): 3 mL via INTRAVENOUS

## 2019-07-17 MED ORDER — CARVEDILOL 25 MG PO TABS
25.0000 mg | ORAL_TABLET | Freq: Two times a day (BID) | ORAL | Status: DC
  Administered 2019-07-17 – 2019-07-20 (×2): 25 mg via ORAL
  Filled 2019-07-17 (×3): qty 1

## 2019-07-17 MED ORDER — SODIUM CHLORIDE 0.9 % IV SOLN: 250.0000 mL | INTRAVENOUS | Status: DC | PRN

## 2019-07-17 MED ORDER — ATORVASTATIN CALCIUM 20 MG PO TABS
40.0000 mg | ORAL_TABLET | Freq: Every day | ORAL | Status: DC
  Administered 2019-07-17 – 2019-07-21 (×4): 40 mg via ORAL
  Filled 2019-07-17 (×4): qty 2

## 2019-07-17 MED ORDER — VANCOMYCIN HCL IN DEXTROSE 1-5 GM/200ML-% IV SOLN
1000.0000 mg | Freq: Once | INTRAVENOUS | Status: AC
  Administered 2019-07-17: 1000 mg via INTRAVENOUS
  Filled 2019-07-17 (×2): qty 200

## 2019-07-17 MED ORDER — HYDROCODONE-ACETAMINOPHEN 5-325 MG PO TABS
1.0000 | ORAL_TABLET | Freq: Four times a day (QID) | ORAL | Status: DC | PRN
  Administered 2019-07-17 – 2019-07-21 (×2): 1 via ORAL
  Filled 2019-07-17 (×2): qty 1

## 2019-07-17 MED ORDER — MORPHINE SULFATE (PF) 2 MG/ML IV SOLN
2.0000 mg | INTRAVENOUS | Status: DC | PRN
  Administered 2019-07-17 – 2019-07-21 (×2): 2 mg via INTRAVENOUS
  Filled 2019-07-17 (×2): qty 1

## 2019-07-17 MED ORDER — ISOSORBIDE MONONITRATE ER 30 MG PO TB24
30.0000 mg | ORAL_TABLET | Freq: Every day | ORAL | Status: DC
  Administered 2019-07-17 – 2019-07-21 (×4): 30 mg via ORAL
  Filled 2019-07-17 (×4): qty 1

## 2019-07-17 MED ORDER — RENA-VITE PO TABS
1.0000 | ORAL_TABLET | Freq: Every day | ORAL | Status: DC
  Administered 2019-07-17: 1 via ORAL
  Filled 2019-07-17: qty 1

## 2019-07-17 MED ORDER — SODIUM CHLORIDE 0.9 % IV SOLN
2.0000 g | Freq: Once | INTRAVENOUS | Status: AC
  Administered 2019-07-17: 2 g via INTRAVENOUS
  Filled 2019-07-17: qty 20

## 2019-07-17 NOTE — ED Triage Notes (Signed)
Pt here for wound check. Had left great toe amputated one month ago, not healing.  Tissue does not appear healthy.  Also starting with redness and possible infection to right great toe. No fever. Sent for ischemic leg but both feet are arm with cap refill WNL. No fever. No pain.  Legs do not appear ischemic currently. Pulse difficult to palpate though, will need doppler pulse.

## 2019-07-17 NOTE — Consult Note (Signed)
[facility] Vascular Consult Note  MRN : [id number]  Scott [name] is a 58 y.o. ([birth year]) male who presents with chief complaint of  Chief Complaint  Patient presents with  . Wound Check  .  History of Present Illness: I am asked to see the patient by Dr. Posey Pronto for evaluation of PAD with nonhealing ulcerations.  He was seen previously by my partner in the office and noninvasive studies showed significant atherosclerotic occlusive disease of both lower extremities with at least moderate if not severe reduction in blood flow.  He has a left great toe amputation site that is poorly healing with fibrinous exudate and a wide open wound.  He has sloughing of the skin of the right great toe as well.  His wounds are somewhat painful.  No fevers or chills.  No nausea or vomiting.  Had a negative Covid test a few days ago.  The pain is present throughout multiple toes on both feet a little worse on the left than the right.  Nothing really makes that better at this point.  No current facility-administered medications for this encounter.    Current Outpatient Medications  Medication Sig Dispense Refill  . albuterol (PROVENTIL HFA;VENTOLIN HFA) 108 (90 BASE) MCG/ACT inhaler Inhale 2 puffs into the lungs every 6 (six) hours as needed for wheezing or shortness of breath. 1 Inhaler 2  . aspirin EC 81 MG tablet Take 1 tablet (81 mg total) by mouth daily. 30 tablet 0  . atorvastatin (LIPITOR) 40 MG tablet Take 40 mg by mouth daily.    . B Complex-C-Folic Acid (DIALYVITE 433) 0.8 MG TABS Take 1 tablet by mouth daily.  6  . calcitRIOL (ROCALTROL) 0.25 MCG capsule Take 0.25 mcg by mouth daily.    . calcium acetate (PHOSLO) 667 MG capsule Take 2,001 mg by mouth 3 (three) times daily with meals. Takes 1-2 caps if he has snacks    . carvedilol (COREG) 25 MG tablet Take 1 tablet (25 mg total) by mouth 2 (two) times daily with a meal. 180 tablet 3  . Cholecalciferol (VITAMIN D) 2000 units  tablet Take 2,000 Units by mouth daily. Unsure how many units    . clopidogrel (PLAVIX) 75 MG tablet Take 1 tablet (75 mg total) by mouth daily with breakfast. 30 tablet 2  . furosemide (LASIX) 80 MG tablet Take 80 mg by mouth daily.    [name] gentamicin cream (GARAMYCIN) 0.1 % Apply 1 application topically daily.    . hydrALAZINE (APRESOLINE) 25 MG tablet Take 1 tablet (25 mg total) by mouth 3 (three) times daily. (Patient taking differently: Take 25 mg by mouth 3 (three) times daily. Pt states he only takes once a day) 90 tablet 1  . HYDROcodone-acetaminophen (NORCO/VICODIN) 5-325 MG tablet Take 1 tablet by mouth every 4 (four) hours as needed. 8 tablet 0  . insulin aspart protamine- aspart (NOVOLOG MIX 70/30) (70-30) 100 UNIT/ML injection Inject 0.25 mLs (25 Units total) into the skin 2 (two) times daily with a meal. (Patient taking differently: Inject 20-25 Units into the skin 2 (two) times daily with a meal. ) 20 mL 1  . isosorbide mononitrate (IMDUR) 30 MG 24 hr tablet Take 1 tablet (30 mg total) by mouth daily. 90 tablet 3  . nitroGLYCERIN (NITROSTAT) 0.4 MG SL tablet Place 0.4 mg under the tongue every 5 (five) minutes as needed for chest pain. Pt needs new Rx. Bottle has expried    . omeprazole (PRILOSEC) 20  MG capsule Take 1 capsule (20 mg total) by mouth daily. 30 capsule 0  . ONE TOUCH ULTRA TEST test strip     . tamsulosin (FLOMAX) 0.4 MG CAPS capsule Take 1 capsule (0.4 mg total) by mouth daily. 30 capsule 11    Past Medical History:  Diagnosis Date  . Arthritis    "left arm; right leg" (12/13/2014)  . Asthma   . CHF (congestive heart failure) ([place])   . Chronic disease anemia    [name] 12/13/2014  . Chronic kidney disease (CKD), stage IV (severe) ([place])    [name] 12/13/2014... on dialysis  . Complication of anesthesia    unable to urinate after CAPD urgery  . Continuous ambulatory peritoneal dialysis status ([place])   . Coronary artery disease    [name] 12/13/2014  . Depression   .  Dysrhythmia    patient unaware of irregular heartbeat  . GERD (gastroesophageal reflux disease)   . High cholesterol    [name] 12/13/2014  . Hypertension   . Non-Q wave myocardial infarction ([place])    [name] 12/13/2014  . PVD (peripheral vascular disease) ([place])    [name] 12/13/2014  . Type II diabetes mellitus ([place])     Past Surgical History:  Procedure Laterality Date  . AMPUTATION TOE Left 05/24/2019   Procedure: Left great toe amputation;  Surgeon: Caroline More, DPM;  Location: [facility];  Service: Podiatry;  Laterality: Left;  . AV FISTULA PLACEMENT Right 04/07/2017   Procedure: ARTERIOVENOUS (AV) FISTULA CREATION ( BRACHIALCEPHALIC );  Surgeon: Katha Cabal, MD;  Location: [facility];  Service: Vascular;  Laterality: Right;  . CAPD INSERTION N/A 04/07/2017   Procedure: LAPAROSCOPIC INSERTION CONTINUOUS AMBULATORY PERITONEAL DIALYSIS  (CAPD) CATHETER;  Surgeon: Katha Cabal, MD;  Location: [facility];  Service: Vascular;  Laterality: N/A;  . CARDIAC CATHETERIZATION  11/2014  . COLONOSCOPY WITH PROPOFOL N/A 12/01/2017   Procedure: COLONOSCOPY WITH PROPOFOL;  Surgeon: Lin Landsman, MD;  Location: [place];  Service: Endoscopy;  Laterality: N/A;  Diabetic - insulin  . COLONOSCOPY WITH PROPOFOL N/A 12/29/2017   Procedure: COLONOSCOPY WITH PROPOFOL;  Surgeon: Lin Landsman, MD;  Location: [place];  Service: Endoscopy;  Laterality: N/A;  . DIALYSIS/PERMA CATHETER INSERTION N/A 01/18/2017   Procedure: Dialysis/Perma Catheter Insertion;  Surgeon: Algernon Huxley, MD;  Location: [place] [facility];  Service: Cardiovascular;  Laterality: N/A;  . DIALYSIS/PERMA CATHETER REMOVAL N/A 07/26/2017   Procedure: DIALYSIS/PERMA CATHETER REMOVAL;  Surgeon: Algernon Huxley, MD;  Location: [place] [facility];  Service: Cardiovascular;  Laterality: N/A;  . INCISION AND DRAINAGE OF WOUND Right ~ 10/2014   "4th toe foot"  . LOWER EXTREMITY ANGIOGRAPHY Left 05/23/2019   Procedure:  Lower Extremity Angiography;  Surgeon: Katha Cabal, MD;  Location: [place] [facility];  Service: Cardiovascular;  Laterality: Left;  . PERCUTANEOUS CORONARY STENT INTERVENTION (PCI-S) N/A 12/14/2014   Procedure: PERCUTANEOUS CORONARY STENT INTERVENTION (PCI-S);  Surgeon: Charolette Forward, MD;  Location: [facility];  Service: Cardiovascular;  Laterality: N/A;  . PERCUTANEOUS CORONARY STENT INTERVENTION (PCI-S) N/A 12/18/2014   Procedure: PERCUTANEOUS CORONARY STENT INTERVENTION (PCI-S);  Surgeon: Charolette Forward, MD;  Location: [facility] CATH LAB;  Service: Cardiovascular;  Laterality: N/A;  . POLYPECTOMY  12/01/2017   Procedure: POLYPECTOMY;  Surgeon: Lin Landsman, MD;  Location: [place];  Service: Endoscopy;;  . POLYPECTOMY  12/29/2017   Procedure: POLYPECTOMY;  Surgeon: Lin Landsman, MD;  Location: [place];  Service:  Endoscopy;;  . TOE AMPUTATION Right 2015   4th toe     Social History   Tobacco Use  . Smoking status: Former Smoker    Packs/day: 1.00    Years: 30.00    Pack years: 30.00    Types: Cigarettes    Start date: 08/31/1984    Quit date: 11/30/2014    Years since quitting: 4.6  . Smokeless tobacco: Never Used  Substance Use Topics  . Alcohol use: Yes    Comment: Rarely, twice a year.  . Drug use: No     Family History  Problem Relation Age of Onset  . Cancer Mother        Lung  . Cancer Father        Lung  . Diabetes Sister   . Diabetes Brother   . CAD Brother   . Hernia Son   . Cancer Brother     No Known Allergies   REVIEW OF SYSTEMS (Negative unless checked)  Constitutional: [] Weight loss  [] Fever  [] Chills Cardiac: [] Chest pain   [] Chest pressure   [] Palpitations   [] Shortness of breath when laying flat   [] Shortness of breath at rest   [] Shortness of breath with exertion. Vascular:  [x] Pain in legs with walking   [] Pain in legs at rest   [] Pain in legs when laying flat   [] Claudication   [] Pain in feet when walking   [x] Pain in feet at rest  [] Pain in feet when laying flat   [] History of DVT   [] Phlebitis   [] Swelling in legs   [] Varicose veins   [x] Non-healing ulcers Pulmonary:   [] Uses home oxygen   [] Productive cough   [] Hemoptysis   [] Wheeze  [] COPD   [] Asthma Neurologic:  [] Dizziness  [] Blackouts   [] Seizures   [] History of stroke   [] History of TIA  [] Aphasia   [] Temporary blindness   [] Dysphagia   [] Weakness or numbness in arms   [] Weakness or numbness in legs Musculoskeletal:  [x] Arthritis   [] Joint swelling   [] Joint pain   [] Low back pain Hematologic:  [] Easy bruising  [] Easy bleeding   [] Hypercoagulable state   [x] Anemic  [] Hepatitis Gastrointestinal:  [] Blood in stool   [] Vomiting blood  [] Gastroesophageal reflux/heartburn   [] Difficulty swallowing. Genitourinary:  [x] Chronic kidney disease   [] Difficult urination  [] Frequent urination  [] Burning with urination   [] Blood in urine Skin:  [] Rashes   [x] Ulcers   [x] Wounds Psychological:  [] History of anxiety   []  History of major depression.  Physical Examination  Vitals:   07/17/19 1217 07/17/19 1219 07/17/19 1225  BP:   123/74  Pulse:   85  Resp:   18  Temp: 97.9 F (36.6 C)  97.9 F (36.6 C)  TempSrc: Oral  Oral  SpO2:   97%  Weight:  95.7 kg   Height:  5\' 9"  (1.753 m)    Body mass index is 31.16 kg/m. Gen:  WD/WN, NAD Head: [place]/AT, No temporalis wasting.  Ear/Nose/Throat: Hearing grossly intact, nares w/o erythema or drainage, oropharynx w/o Erythema/Exudate Eyes: Sclera non-icteric, conjunctiva clear Neck: Trachea midline.  No JVD.  Pulmonary:  Good air movement, respirations not labored, equal bilaterally.  Cardiac: somewhat irretular Vascular:  Vessel Right Left  Radial Palpable Palpable                          PT Not Palpable Not Palpable  DP Trace Palpable Not Palpable   Gastrointestinal: soft, non-tender/non-distended. No guarding/reflex.  Musculoskeletal: M/S 5/5 throughout.  Left great toe amputation site poorly  healing as below.  No deformity or atrophy. No edema. Neurologic: Sensation somewhat diminished in extremities.  Symmetrical.  Speech is fluent. Motor exam as listed above. Psychiatric: Judgment intact, Mood & affect appropriate for pt's clinical situation. Dermatologic: Left great toe amputation site open with fibrinous exudate and very poor granulation tissue.  Right great toe has had sloughing of the skin which almost looks like a 2nd-degree burn      CBC Lab Results  Component Value Date   WBC 9.9 06/12/2019   HGB 9.1 (L) 06/12/2019   HCT 28.4 (L) 06/12/2019   MCV 97.3 06/12/2019   PLT 283 06/12/2019    BMET    Component Value Date/Time   NA 136 06/12/2019 1508   NA 140 04/16/2015   NA 140 12/13/2014 0102   K 4.2 06/12/2019 1508   K 4.6 12/13/2014 0102   CL 101 06/12/2019 1508   CL 111 12/13/2014 0102   CO2 22 06/12/2019 1508   CO2 23 12/13/2014 0102   GLUCOSE 128 (H) 06/12/2019 1508   GLUCOSE 213 (H) 12/13/2014 0102   BUN 64 (H) 06/12/2019 1508   BUN 44 (A) 04/16/2015   BUN 25 (H) 12/13/2014 0102   CREATININE 10.18 (H) 06/12/2019 1508   CREATININE 1.95 (H) 12/13/2014 0102   CALCIUM 7.5 (L) 06/12/2019 1508   CALCIUM 8.0 (L) 12/13/2014 0102   GFRNONAA 5 (L) 06/12/2019 1508   GFRNONAA 37 (L) 12/13/2014 0102   GFRAA 6 (L) 06/12/2019 1508   GFRAA 43 (L) 12/13/2014 0102   CrCl cannot be calculated (Patient's most recent lab result is older than the maximum 21 days allowed.).  COAG Lab Results  Component Value Date   INR 0.99 03/25/2017   INR 1.08 01/10/2015   INR 1.10 12/14/2014    Radiology Nm Myocar Multi W/spect W/wall Motion / Ef  Result Date: 07/12/2019  There was no ST segment deviation noted during stress.  There is a medium defect of severe severity present in the apical anterior, apical septal, apical inferior and apex location.  Findings consistent with prior myocardial infarction.  This is an intermediate risk study.  The left ventricular  ejection fraction is severely decreased (<30%).    Vas [place] Lower Extremity Arterial Duplex  Result Date: 07/03/2019 LOWER EXTREMITY ARTERIAL DUPLEX STUDY High Risk Factors: Hypertension.  Vascular Interventions: 05/23/2019 PTA of Lt ATA and dorsalis pedis artery. PTA. Current ABI:            Not obtained Comparison Study: 06/08/2019 Performing Technologist: Almira Coaster RVS  Examination Guidelines: A complete evaluation includes B-mode imaging, spectral Doppler, color Doppler, and power Doppler as needed of all accessible portions of each vessel. Bilateral testing is considered an integral part of a complete examination. Limited examinations for reoccurring indications may be performed as noted.  +-----------+--------+-----+--------+----------+--------+ RIGHT      PSV cm/sRatioStenosisWaveform  Comments +-----------+--------+-----+--------+----------+--------+ CFA Distal 102                  monophasic         +-----------+--------+-----+--------+----------+--------+ DFA        123                  monophasic         +-----------+--------+-----+--------+----------+--------+ SFA Prox   84                   monophasic         +-----------+--------+-----+--------+----------+--------+  SFA Mid    61                   monophasic         +-----------+--------+-----+--------+----------+--------+ SFA Distal 109                  monophasic         +-----------+--------+-----+--------+----------+--------+ POP Distal 58                   monophasic         +-----------+--------+-----+--------+----------+--------+ ATA Distal 0                    Occluded           +-----------+--------+-----+--------+----------+--------+ PTA Distal 30                   monophasic         +-----------+--------+-----+--------+----------+--------+ PERO Distal28                   monophasic         +-----------+--------+-----+--------+----------+--------+   +-----------+--------+-----+--------+----------+--------+ LEFT       PSV cm/sRatioStenosisWaveform  Comments +-----------+--------+-----+--------+----------+--------+ CFA Distal 95                   monophasic         +-----------+--------+-----+--------+----------+--------+ DFA        28                   monophasic         +-----------+--------+-----+--------+----------+--------+ SFA Prox   96                   monophasic         +-----------+--------+-----+--------+----------+--------+ SFA Mid    117                  monophasic         +-----------+--------+-----+--------+----------+--------+ SFA Distal 70                   monophasic         +-----------+--------+-----+--------+----------+--------+ POP Distal 60                   monophasic         +-----------+--------+-----+--------+----------+--------+ ATA Distal 105                  monophasic         +-----------+--------+-----+--------+----------+--------+ PTA Distal 0                    Occluded           +-----------+--------+-----+--------+----------+--------+ PERO Distal30                   monophasic         +-----------+--------+-----+--------+----------+--------+  Summary: Right: Imaging and Waveforms obtained throughout in the Right Lower Extremity. Monophasic flow seen Predominantly in the Right Lower Extremity; No flow seen in the Right ATA. Left: Imaging and Waveforms obtained throughout in the Left Lower Extremity. Monophasic flow seen Predominantly in the Left Lower Extremity; No flow seen in the Left PTA. Blood Pressure cannot be obtained in the Left>250.   See table(s) above for measurements and observations. Electronically signed by Hortencia Pilar MD on 07/03/2019 at 5:29:03 PM.    Final       Assessment/Plan 1.  Atherosclerotic occlusive disease with ulceration of both lower  extremities.  Left great toe amputation is poorly healing with fibrinous exudate.  Patient  should undergo angiography with potential revascularization and is on the schedule for tomorrow for the left leg.  He will ultimately need angiography with potential revascularization on the right leg as well for the ulcerative changes on the right, and that can be done either later this week or next week pending the schedule availability, amount of contrast used, and how he tolerates the initial procedure tomorrow.  Risks and benefits were discussed with the patient and he is agreeable to proceed. 2.  Diabetes mellitus. blood glucose control important in reducing the progression of atherosclerotic disease. Also, involved in wound healing. On appropriate medications. 3.  ESRD.  This certainly represents an atherosclerotic risk factor and a risk factor for poor wound healing even with good blood flow.  Nephrology will see the patient while he is in hospital to help arrange dialysis. 4.  Hypertension.  Stable on outpatient medications and blood pressure control important in reducing the progression of atherosclerotic disease. On appropriate oral medications.    Leotis Pain, MD  07/17/2019 12:54 PM    This note was created with Dragon medical transcription system.  Any error is purely unintentional

## 2019-07-17 NOTE — Consult Note (Signed)
PODIATRY / FOOT AND ANKLE SURGERY CONSULTATION NOTE  Requesting Physician: Florina Ou, MD  Reason for consult: Foot necrosis, wounds  Chief Complaint: Foot wounds, feeling ill   HPI: Scott [name] is a 58 y.o. male who presented today in office for follow-up on left partial first ray amputation and new wound to the right hallux with some dusky type changes.  Patient states overall that he is not feeling well and has not been able to eat very much lately.  He states that he also recently underwent a stress test which showed infarction in his heart.  Patient states overall that he is noted this change to the right big toe for a couple of days now.  Patient was seen in clinic a few weeks ago and a bone biopsy was taken of the left distal remaining portion of the first metatarsal which cultured Citrobacter and Pseudomonas and the pathology report came back as osteomyelitis, chronic necrosis.  Patient has been seen outpatient by the wound care center, vascular, and by podiatry for care for his wounds.  Patient is supposed to undergo an angiogram with possible intervention tomorrow with vascular surgery.  Patient was seen today with these conditions that are listed above and patient overall feeling ill so he was sent to the ED for possible admission.  Discussed this with Dr. Florina Ou in detail prior to sending to ED.  PMHx:  Past Medical History:  Diagnosis Date  . Arthritis    "left arm; right leg" (12/13/2014)  . Asthma   . CHF (congestive heart failure) ([place])   . Chronic disease anemia    [name] 12/13/2014  . Chronic kidney disease (CKD), stage IV (severe) ([place])    [name] 12/13/2014... on dialysis  . Complication of anesthesia    unable to urinate after CAPD urgery  . Continuous ambulatory peritoneal dialysis status ([place])   . Coronary artery disease    [name] 12/13/2014  . Depression   . Dysrhythmia    patient unaware of irregular heartbeat  . GERD (gastroesophageal reflux disease)   .  High cholesterol    [name] 12/13/2014  . Hypertension   . Non-Q wave myocardial infarction ([place])    [name] 12/13/2014  . PVD (peripheral vascular disease) ([place])    [name] 12/13/2014  . Type II diabetes mellitus ([place])     Surgical Hx:  Past Surgical History:  Procedure Laterality Date  . AMPUTATION TOE Left 05/24/2019   Procedure: Left great toe amputation;  Surgeon: Caroline More, DPM;  Location: [facility];  Service: Podiatry;  Laterality: Left;  . AV FISTULA PLACEMENT Right 04/07/2017   Procedure: ARTERIOVENOUS (AV) FISTULA CREATION ( BRACHIALCEPHALIC );  Surgeon: Katha Cabal, MD;  Location: [facility];  Service: Vascular;  Laterality: Right;  . CAPD INSERTION N/A 04/07/2017   Procedure: LAPAROSCOPIC INSERTION CONTINUOUS AMBULATORY PERITONEAL DIALYSIS  (CAPD) CATHETER;  Surgeon: Katha Cabal, MD;  Location: [facility];  Service: Vascular;  Laterality: N/A;  . CARDIAC CATHETERIZATION  11/2014  . COLONOSCOPY WITH PROPOFOL N/A 12/01/2017   Procedure: COLONOSCOPY WITH PROPOFOL;  Surgeon: Lin Landsman, MD;  Location: [place];  Service: Endoscopy;  Laterality: N/A;  Diabetic - insulin  . COLONOSCOPY WITH PROPOFOL N/A 12/29/2017   Procedure: COLONOSCOPY WITH PROPOFOL;  Surgeon: Lin Landsman, MD;  Location: [place];  Service: Endoscopy;  Laterality: N/A;  . DIALYSIS/PERMA CATHETER INSERTION N/A 01/18/2017   Procedure: Dialysis/Perma Catheter Insertion;  Surgeon: Algernon Huxley, MD;  Location: [place]  CV LAB;  Service: Cardiovascular;  Laterality: N/A;  . DIALYSIS/PERMA CATHETER REMOVAL N/A 07/26/2017   Procedure: DIALYSIS/PERMA CATHETER REMOVAL;  Surgeon: Algernon Huxley, MD;  Location: [place] [facility];  Service: Cardiovascular;  Laterality: N/A;  . INCISION AND DRAINAGE OF WOUND Right ~ 10/2014   "4th toe foot"  . LOWER EXTREMITY ANGIOGRAPHY Left 05/23/2019   Procedure: Lower Extremity Angiography;  Surgeon: Katha Cabal, MD;  Location: [place]  CV LAB;  Service: Cardiovascular;  Laterality: Left;  . PERCUTANEOUS CORONARY STENT INTERVENTION (PCI-S) N/A 12/14/2014   Procedure: PERCUTANEOUS CORONARY STENT INTERVENTION (PCI-S);  Surgeon: Charolette Forward, MD;  Location: [facility] CATH LAB;  Service: Cardiovascular;  Laterality: N/A;  . PERCUTANEOUS CORONARY STENT INTERVENTION (PCI-S) N/A 12/18/2014   Procedure: PERCUTANEOUS CORONARY STENT INTERVENTION (PCI-S);  Surgeon: Charolette Forward, MD;  Location: [facility] CATH LAB;  Service: Cardiovascular;  Laterality: N/A;  . POLYPECTOMY  12/01/2017   Procedure: POLYPECTOMY;  Surgeon: Lin Landsman, MD;  Location: [place];  Service: Endoscopy;;  . POLYPECTOMY  12/29/2017   Procedure: POLYPECTOMY;  Surgeon: Lin Landsman, MD;  Location: [place];  Service: Endoscopy;;  . TOE AMPUTATION Right 2015   4th toe    FHx:  Family History  Problem Relation Age of Onset  . Cancer Mother        Lung  . Cancer Father        Lung  . Diabetes Sister   . Diabetes Brother   . CAD Brother   . Hernia Son   . Cancer Brother     Social History:  reports that he quit smoking about 4 years ago. His smoking use included cigarettes. He started smoking about 34 years ago. He has a 30.00 pack-year smoking history. He has never used smokeless tobacco. He reports current alcohol use. He reports that he does not use drugs.  Allergies: No Known Allergies  Review of Systems: General ROS: positive for  - fatigue Psychological ROS: negative Respiratory ROS: no cough, shortness of breath, or wheezing Cardiovascular ROS: no chest pain or dyspnea on exertion Gastrointestinal ROS: no abdominal pain, change in bowel habits, or black or bloody stools Musculoskeletal ROS: positive for - joint pain and joint swelling Neurological ROS: positive for - numbness/tingling Dermatological ROS: positive for Wounds to both feet  Physical Exam: General: Alert and oriented.  No apparent distress.  Vascular: DP/PT  pulses nonpalpable bilateral.  Capillary fill time delayed to the right hallux and digit appears to be cold in nature.  No hair growth to digits noted to bilateral lower extremities.  Dusky changes to the distal aspect of the right hallux as well as left second toe.  Neuro: Light touch sensation absent to bilateral lower extremities.  Derm: Ulceration to the left first metatarsal area in the area of the partial first ray amputation site, exposed bone noted, wound bed appears to be fibrogranular with areas of necrosis.  Only serous drainage present today.  Mild periwound erythema and edema present.  Dermal type deep tissue injury/ulceration to the right hallux, decreased capillary fill time noted to this area and toe appears to be cold to the touch.  Erythema and edema present but does not appear to be blanchable but is reduced with elevation consistent with early signs of gangrene/tissue loss.  Left second toe with dusky purple type discoloration and slightly delayed capillary fill time.  MSK: Left partial first ray amputation.  Pain on palpation left foot.  No results found for  this or any previous visit (from the past 48 hour(s)). No results found.  Blood pressure 123/74, pulse 85, temperature 97.9 F (36.6 C), temperature source Oral, resp. rate 18, height 5\' 9"  (1.753 m), weight 95.7 kg, SpO2 97 %.   Assessment 1. Left foot first metatarsal osteomyelitis 2. Left foot chronic wound with gangrenous changes, second toe gangrene 3. 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 PVD 5. Diabetes type 2 with neuropathy  Plan -Patient examined in clinic today. -Wound to the left foot appears to be stable but new changes of purplish hue and discoloration to the left second toe.  Bone is still exposed at this time at the distal first metatarsal.  Wound bed appears to be fibrogranular.  No purulent drainage able to be expressed, mild periwound erythema and edema present. -New  wound to the right hallux after selective debridement was performed, revealed dermal type tissue with dusky changes and decreased capillary fill time present with concern for new early gangrene. -Discussed with patient in clinic that overall he did not appear to be well and was not feeling well verbally and due to patients worsening condition recommend admission. -Discussed this case with Dr. Florina Ou in detail.  She recommends patient go to ED for workup and admission.  Appreciate recommendations for antibiotics.  Patient's previous bone culture left first metatarsal grew Citrobacter and Pseudomonas. -Discussed vascular findings with Dr. [name] who is agreeable.  Patient will be seen in the ED.  Plans for revascularization procedure upcoming.  Will likely need both sides dressed. -Discussed with patient prior to admission that he could potentially need a right hallux amputation and possibly a left transmetatarsal amputation with tendo Achilles lengthening.  Patient would like to revisit this potentially after revascularization procedures are performed. -Continue Betadine dressings to both feet. -We will continue to evaluate patient during admission process.  Caroline More 07/17/2019, 1:06 PM

## 2019-07-17 NOTE — H&P (Signed)
History and Physical    JOHNTAE BROXTERMAN HUD:[id number] DOB: July 04, 1961 DOA: 07/17/2019  PCP: [employer] [employer]   Patient coming from: Home  Chief Complaint: Right Toe pain.  HPI: DURWARD MATRANGA is a 58 y.o. male with medical history significant of  Rt toe pain since 3 days no trauma /4/10 right now /off and on and unrelated to any ppting or worsening factors. Pt has been taking pain meds and it helps it. Pain throbbing in nature and  is better with elevation of his leg. .  Worse with bearing weight.  Pain is localized and NR.  Patient has a past medical history of left partial MTP amputation, peripheral arterial disease, diabetes mellitus type 2, end-stage renal disease on peritoneal dialysis seen in the emergency room for right toe pain that has been going on for the past few days.  Patient states that it is throbbing in nature and intermittent in nature nonradiating and he takes his pain medications which helps his pain. He was at his podiatrist appointment today for left foot OM and second toe gangrene with rt great toe gangrene with ulceration.He follows up with wound care clinic at [facility]. Pt is scheduled for angiogram tomorrow.  Overall pt is stable except for pain and is to be admitted for OM  And PAD causing nonhealing ischemic ulcers. Please note patient has had a nuclear myocardial perfusion study earlier this month I suspect as part of a preoperative work-up for preprocedural work-up for the patient, this study shows decreased ejection fraction to less than 30%, with a intermediate risk study no ST segment deviation noted during stress portion patient does have a medium defect of severe severity present in the apical anterior apical septal and apical inferior and apex location.  Chart review does show that patient has seen a cardiologist in 2018 however I do not see any new follow-up or office visits.  Patient does follow-up with his wound and vascular doctors and  podiatrist on a routine regular basis.  ED Course:  Blood pressure (!) 137/46, pulse 85, temperature 97.9 F (36.6 C), temperature source Oral, resp. rate 18, height 5\' 9"  (1.753 m), weight 95.7 kg, SpO2 94 %.Pt is chronically ill appearing and abd is distended. Pt has been seen by Vascular surgeon and is receiving iv abx rocephin and vancomycin.  Initial labs show a white count of 10.6, hemoglobin of 9.6, RDW of 14.2, platelets of 361.  CMP shows sodium of 135, glucose of 128, BUN of 47, serum creatinine of 7.24.  Review of Systems: As per HPI otherwise 10 point review of systems negative.    Past Medical History:  Diagnosis Date  . Acute respiratory failure with hypoxia ([place]) 09/17/2018  . Arthritis    "left arm; right leg" (12/13/2014)  . Asthma   . CHF (congestive heart failure) ([place])   . Chronic disease anemia    [name] 12/13/2014  . Chronic kidney disease (CKD), stage IV (severe) ([place])    [name] 12/13/2014... on dialysis  . Complication of anesthesia    unable to urinate after CAPD urgery  . Continuous ambulatory peritoneal dialysis status ([place])   . Coronary artery disease    [name] 12/13/2014  . Depression   . Dysrhythmia    patient unaware of irregular heartbeat  . GERD (gastroesophageal reflux disease)   . High cholesterol    [name] 12/13/2014  . Hypertension   . Non-Q wave myocardial infarction ([place])    [name] 12/13/2014  . PVD (  peripheral vascular disease) ([place])    [name] 12/13/2014  . Type II diabetes mellitus ([place])     Past Surgical History:  Procedure Laterality Date  . AMPUTATION TOE Left 05/24/2019   Procedure: Left great toe amputation;  Surgeon: Caroline More, DPM;  Location: [facility] ORS;  Service: Podiatry;  Laterality: Left;  . AV FISTULA PLACEMENT Right 04/07/2017   Procedure: ARTERIOVENOUS (AV) FISTULA CREATION ( BRACHIALCEPHALIC );  Surgeon: Katha Cabal, MD;  Location: [facility] ORS;  Service: Vascular;  Laterality: Right;  . CAPD INSERTION N/A 04/07/2017    Procedure: LAPAROSCOPIC INSERTION CONTINUOUS AMBULATORY PERITONEAL DIALYSIS  (CAPD) CATHETER;  Surgeon: Katha Cabal, MD;  Location: [facility] ORS;  Service: Vascular;  Laterality: N/A;  . CARDIAC CATHETERIZATION  11/2014  . COLONOSCOPY WITH PROPOFOL N/A 12/01/2017   Procedure: COLONOSCOPY WITH PROPOFOL;  Surgeon: Lin Landsman, MD;  Location: [place];  Service: Endoscopy;  Laterality: N/A;  Diabetic - insulin  . COLONOSCOPY WITH PROPOFOL N/A 12/29/2017   Procedure: COLONOSCOPY WITH PROPOFOL;  Surgeon: Lin Landsman, MD;  Location: [place];  Service: Endoscopy;  Laterality: N/A;  . DIALYSIS/PERMA CATHETER INSERTION N/A 01/18/2017   Procedure: Dialysis/Perma Catheter Insertion;  Surgeon: Algernon Huxley, MD;  Location: [place] CV LAB;  Service: Cardiovascular;  Laterality: N/A;  . DIALYSIS/PERMA CATHETER REMOVAL N/A 07/26/2017   Procedure: DIALYSIS/PERMA CATHETER REMOVAL;  Surgeon: Algernon Huxley, MD;  Location: [place] CV LAB;  Service: Cardiovascular;  Laterality: N/A;  . INCISION AND DRAINAGE OF WOUND Right ~ 10/2014   "4th toe foot"  . LOWER EXTREMITY ANGIOGRAPHY Left 05/23/2019   Procedure: Lower Extremity Angiography;  Surgeon: Katha Cabal, MD;  Location: [place] CV LAB;  Service: Cardiovascular;  Laterality: Left;  . PERCUTANEOUS CORONARY STENT INTERVENTION (PCI-S) N/A 12/14/2014   Procedure: PERCUTANEOUS CORONARY STENT INTERVENTION (PCI-S);  Surgeon: Charolette Forward, MD;  Location: [facility] CATH LAB;  Service: Cardiovascular;  Laterality: N/A;  . PERCUTANEOUS CORONARY STENT INTERVENTION (PCI-S) N/A 12/18/2014   Procedure: PERCUTANEOUS CORONARY STENT INTERVENTION (PCI-S);  Surgeon: Charolette Forward, MD;  Location: [facility] CATH LAB;  Service: Cardiovascular;  Laterality: N/A;  . POLYPECTOMY  12/01/2017   Procedure: POLYPECTOMY;  Surgeon: Lin Landsman, MD;  Location: [place];  Service: Endoscopy;;  . POLYPECTOMY  12/29/2017   Procedure:  POLYPECTOMY;  Surgeon: Lin Landsman, MD;  Location: [place];  Service: Endoscopy;;  . TOE AMPUTATION Right 2015   4th toe     reports that he quit smoking about 4 years ago. His smoking use included cigarettes. He started smoking about 34 years ago. He has a 30.00 pack-year smoking history. He has never used smokeless tobacco. He reports current alcohol use. He reports that he does not use drugs.  No Known Allergies  Family History  Problem Relation Age of Onset  . Cancer Mother        Lung  . Cancer Father        Lung  . Diabetes Sister   . Diabetes Brother   . CAD Brother   . Hernia Son   . Cancer Brother     Prior to Admission medications   Medication Sig Start Date End Date Taking? Authorizing Provider  albuterol (PROVENTIL HFA;VENTOLIN HFA) 108 (90 BASE) MCG/ACT inhaler Inhale 2 puffs into the lungs every 6 (six) hours as needed for wheezing or shortness of breath. 01/10/15  Yes Gladstone Lighter, MD  aspirin EC 81 MG tablet Take 1 tablet (81 mg  total) by mouth daily. 01/10/15  Yes Gladstone Lighter, MD  atorvastatin (LIPITOR) 40 MG tablet Take 40 mg by mouth daily. 05/15/19  Yes [provider]  B Complex-C-Folic Acid (DIALYVITE 929) 0.8 MG TABS Take 1 tablet by mouth daily. 10/25/17  Yes [provider]  calcitRIOL (ROCALTROL) 0.25 MCG capsule Take 0.25 mcg by mouth daily.   Yes [provider]  calcium acetate (PHOSLO) 667 MG capsule Take 2,001 mg by mouth 3 (three) times daily with meals. Takes 1-2 caps if he has snacks 06/08/17  Yes [provider]  carvedilol (COREG) 25 MG tablet Take 1 tablet (25 mg total) by mouth 2 (two) times daily with a meal. 08/30/17  Yes Darylene Price A, FNP  Cholecalciferol (VITAMIN D) 2000 units tablet Take 2,000 Units by mouth daily. Unsure how many units   Yes [provider]  clopidogrel (PLAVIX) 75 MG tablet Take 1 tablet (75 mg total) by mouth daily with breakfast. 05/27/19  Yes  Dustin Flock, MD  furosemide (LASIX) 80 MG tablet Take 80 mg by mouth daily. 12/01/18  Yes [provider]  gentamicin cream (GARAMYCIN) 0.1 % Apply 1 application topically daily. 03/02/18  Yes [provider]  hydrALAZINE (APRESOLINE) 25 MG tablet Take 1 tablet (25 mg total) by mouth 3 (three) times daily. 03/12/18 07/17/19 Yes Fritzi Mandes, MD  HYDROcodone-acetaminophen (NORCO/VICODIN) 5-325 MG tablet Take 1 tablet by mouth every 4 (four) hours as needed. 06/12/19  Yes Harvest Dark, MD  insulin aspart protamine- aspart (NOVOLOG MIX 70/30) (70-30) 100 UNIT/ML injection Inject 0.25 mLs (25 Units total) into the skin 2 (two) times daily with a meal. Patient taking differently: Inject 20-25 Units into the skin 2 (two) times daily with a meal.  03/12/18  Yes Fritzi Mandes, MD  isosorbide mononitrate (IMDUR) 30 MG 24 hr tablet Take 1 tablet (30 mg total) by mouth daily. 06/23/19 09/21/19 Yes Agbor-Etang, Aaron Edelman, MD  nitroGLYCERIN (NITROSTAT) 0.4 MG SL tablet Place 0.4 mg under the tongue every 5 (five) minutes as needed for chest pain. Pt needs new Rx. Bottle has expried   Yes [provider]  omeprazole (PRILOSEC) 20 MG capsule Take 1 capsule (20 mg total) by mouth daily. 01/10/15  Yes Gladstone Lighter, MD  tamsulosin (FLOMAX) 0.4 MG CAPS capsule Take 1 capsule (0.4 mg total) by mouth daily. 06/19/19  Yes Hollice Espy, MD  traMADol (ULTRAM) 50 MG tablet Take 50 mg by mouth every 6 (six) hours as needed. 07/06/19  Yes [provider]    Physical Exam: Vitals:   07/17/19 1300 07/17/19 1315 07/17/19 1330 07/17/19 1516  BP: (!) 135/48  (!) 137/46 (!) 134/45  Pulse: 84 84 85 89  Resp:    18  Temp:    97.8 F (36.6 C)  TempSrc:    Oral  SpO2: 97% 94% 94% 100%  Weight:      Height:        Constitutional: NAD, calm, comfortable Vitals:   07/17/19 1300 07/17/19 1315 07/17/19 1330 07/17/19 1516  BP: (!) 135/48  (!) 137/46 (!) 134/45  Pulse: 84 84 85 89  Resp:     18  Temp:    97.8 F (36.6 C)  TempSrc:    Oral  SpO2: 97% 94% 94% 100%  Weight:      Height:       General:[place]/AT. Eyes: PERRL,EOMI  lids and conjunctivae normal ENMT: Mucous membranes are moist. Posterior pharynx clear of any exudate or lesions.Normal dentition.  Neck:  normal, supple, no masses, no thyromegaly Respiratory: clear to auscultation bilaterally, no wheezing, no crackles. Normal respiratory effort. No accessory muscle use.  Cardiovascular: Regular rate and rhythm, 3/6 pulmonic systolic  murmurs / rubs / gallops. 3+ extremity edema.  No carotid bruits.  Abdomen: no tenderness, no masses palpated, abd is distended. No hepatosplenomegaly. Bowel sounds positive.  Musculoskeletal: no clubbing / cyanosis. No joint deformity upper and lower extremities. Good ROM, no contractures. Normal muscle tone.  Left first MTP s/p amputation and poor wound healing, rt toe is red and skin sloughing and pdeal pulses difficult to palpate. Skin: no rashes, lesions, ulcers. No induration Neurologic: CN 2-12 grossly intact. Sensation intact, Strength 5/5 in all 4.  Psychiatric: Normal judgment and insight. Alert and oriented x 3. Normal mood.   Labs on Admission: I have personally reviewed following labs and imaging studies  CBC: Recent Labs  Lab 07/17/19 1359  WBC 10.6*  NEUTROABS 8.5*  HGB 9.6*  HCT 30.4*  MCV 94.7  PLT 161   Basic Metabolic Panel: Recent Labs  Lab 07/17/19 1359  NA 135  K 4.7  CL 98  CO2 22  GLUCOSE 128*  BUN 47*  CREATININE 7.24*  CALCIUM 8.1*   GFR: Estimated Creatinine Clearance: 12.4 mL/min (A) (by C-G formula based on SCr of 7.24 mg/dL (H)). Liver Function Tests: No results for input(s): AST, ALT, ALKPHOS, BILITOT, PROT, ALBUMIN in the last 168 hours. No results for input(s): LIPASE, AMYLASE in the last 168 hours. No results for input(s): AMMONIA in the last 168 hours. Coagulation Profile: Recent Labs  Lab 07/17/19 1359  INR 1.2   Cardiac  Enzymes: No results for input(s): CKTOTAL, CKMB, CKMBINDEX, TROPONINI in the last 168 hours. BNP (last 3 results) No results for input(s): PROBNP in the last 8760 hours. HbA1C: No results for input(s): HGBA1C in the last 72 hours. CBG: No results for input(s): GLUCAP in the last 168 hours. Lipid Profile: No results for input(s): CHOL, HDL, LDLCALC, TRIG, CHOLHDL, LDLDIRECT in the last 72 hours. Thyroid Function Tests: No results for input(s): TSH, T4TOTAL, FREET4, T3FREE, THYROIDAB in the last 72 hours. Anemia Panel: No results for input(s): VITAMINB12, FOLATE, FERRITIN, TIBC, IRON, RETICCTPCT in the last 72 hours. Urine analysis:    Component Value Date/Time   COLORURINE YELLOW 06/19/2019 1202   APPEARANCEUR CLEAR 06/19/2019 1202   APPEARANCEUR Clear 12/03/2014 0751   LABSPEC 1.020 06/19/2019 1202   LABSPEC 1.011 12/03/2014 0751   PHURINE 6.0 06/19/2019 1202   GLUCOSEU 100 (A) 06/19/2019 1202   GLUCOSEU 50 mg/dL 12/03/2014 0751   HGBUR MODERATE (A) 06/19/2019 1202   BILIRUBINUR NEGATIVE 06/19/2019 1202   BILIRUBINUR Negative 12/03/2014 0751   KETONESUR NEGATIVE 06/19/2019 1202   PROTEINUR 100 (A) 06/19/2019 1202   NITRITE NEGATIVE 06/19/2019 1202   LEUKOCYTESUR NEGATIVE 06/19/2019 1202   LEUKOCYTESUR Negative 12/03/2014 0751    Radiological Exams on Admission: Dg Foot 2 Views Left  Result Date: 07/17/2019 CLINICAL DATA:  Nonhealing wound of the left foot with perianal and drainage. History of partial amputation. EXAM: LEFT FOOT - 2 VIEW COMPARISON:  Radiographs 05/24/2019 and 05/22/2019. FINDINGS: Stable postsurgical changes following amputation through the base of the 1st metatarsal. There is new poor cortical definition of the 2nd metatarsal head. No progressive destruction of the 1st metatarsal remnant identified. There is a stable prominent plantar calcaneal spur and prominent diffuse vascular calcifications. No unexpected foreign bodies. IMPRESSION: 1. New poor cortical  definition of the 2nd metatarsal head suspicious for  osteomyelitis. 2. Stable postsurgical changes following amputation through the base of the 1st metatarsal. Electronically Signed   By: Richardean Sale M.D.   On: 07/17/2019 14:38   07/2019 NM stress test:  There was no ST segment deviation noted during stress.  There is a medium defect of severe severity present in the apical anterior, apical septal, apical inferior and apex location.  Findings consistent with prior myocardial infarction.  This is an intermediate risk study.  The left ventricular ejection fraction is severely decreased (<30%).   2D Echo/10/2018: Summary: Right: Imaging and Waveforms obtained throughout in the Right Lower Extremity. Monophasic flow seen Predominantly in the Right Lower Extremity; No flow seen in the Right ATA.  Left: Imaging and Waveforms obtained throughout in the Left Lower Extremity. Monophasic flow seen Predominantly in the Left Lower Extremity; No flow seen in the Left PTA.  Blood Pressure cannot be obtained in the Left>250.  EKG: Independently reviewed.  Sinus rhythm heart rate of 82, prominent P waves in lead II, right bundle branch block.  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  Pulses difficult to palpate on exam question 1+ at the most.  Vascular surgery has been consulted and patient has been seen in the emergency room.  Osteomyelitis of second toe of the left foot X-ray report as follows.: 1. New poor cortical definition of the 2nd metatarsal head suspicious for osteomyelitis. 2. Stable postsurgical changes following amputation through the base of the 1st  metatarsal. Patient started on vancomycin and Rocephin for gram-positive gram-negative and MRSA coverage. We will continue the same antibiotic regimen.  Podiatrist on board.  Type 2 diabetes mellitus: Sliding scale insulin/hypoglycemia protocol/Accu-Cheks AC at bedtime/hemoglobin A1c. We will hold patient's 70/30 insulin regimen and continue sliding scale.  End-stage renal disease: Patient has been been on peritoneal dialysis and follows up with nephrology. To continue his calcitriol, PhosLo.   Coronary artery disease: Patient's antiplatelet therapy held for anticipated procedure tomorrow.  To be restarted 24 hours after procedure.  We will continue patient on his Coreg 25 mg, Imdur 30 mg.  Lasix 80 mg, hydralazine 25 mg 3 times daily, as needed nitroglycerin.   DVT prophylaxis: Heparin held (Lovenox/Heparin/SCD's/anticoagulated/None (if comfort care) Code Status: Full code (Full/Partial (specify details) Family Communication: None at bedside (Specify name, relationship. Do not write "discussed with patient". Specify tel # if discussed over the phone) Disposition Plan: Inpatient with plan for discharge to home within 3 to 4 days (specify when and where you expect patient to be discharged) Consults called: Vascular surgeon Dr.-Dew (with names) Admission status: Inpatient with admission (inpatient / obs / tele / medical floor / SDU)   Para Skeans MD [facility] If 7PM-7AM, please contact night-coverage [email or web address] Password [facility]  07/17/2019, 6:26 PM

## 2019-07-17 NOTE — Telephone Encounter (Signed)
   Primary Cardiologist: Kate Sable, MD  Chart reviewed as part of pre-operative protocol coverage. Given past medical history and time since last visit, based on ACC/AHA guidelines, Scott [name] would be at acceptable risk for the planned procedure without further cardiovascular testing.   Per Dr. Garen Lah, patient has an intermediate cardiac risk for procedure. Recent stress test showed infarct and no ischemia. EF from nuclear testing typically underestimates EF due to artifacts. Dr. Garen Lah called the EF as moderately reduced until repeat echocardiogram is performed. He may undergo the procedure/cystoscopy as scheduled. He is on CHF medications. ASA and Plavix can be held and resumed as soon as possible post procedure.   I will route this recommendation to the requesting party via Epic fax function and remove from pre-op pool.  Please call with questions.  [name], NP 07/17/2019, 10:38 AM

## 2019-07-17 NOTE — ED Provider Notes (Signed)
[facility] Emergency Department Provider Note  ____________________________________________  Time seen: Approximately 12:56 PM  I have reviewed the triage vital signs and the nursing notes.   HISTORY  Chief Complaint Wound Check    HPI LLIAM HOH is a 58 y.o. male with a history of CHF CKD CAD GERD diabetes PAD who was sent to the ED today by his podiatrist due to nonhealing wound of the left foot and discoloration of the skin.  Patient notes he has chronic neuropathy and numbness of bilateral feet.   Review of electronic medical record shows the patient had partial amputation of the left first ray of the foot about 2 months ago.  He had revascularization procedure by vascular surgery during the hospitalization as well but has had persistent perfusion issues.  The wound has not been able to heal despite frequent wound care as well.  Symptoms are constant, no aggravating or alleviating factors.  No fevers or chills.  Patient had a Covid screening test 3 days ago which was negative.     Past Medical History:  Diagnosis Date  . Arthritis    "left arm; right leg" (12/13/2014)  . Asthma   . CHF (congestive heart failure) ([place])   . Chronic disease anemia    [name] 12/13/2014  . Chronic kidney disease (CKD), stage IV (severe) ([place])    [name] 12/13/2014... on dialysis  . Complication of anesthesia    unable to urinate after CAPD urgery  . Continuous ambulatory peritoneal dialysis status ([place])   . Coronary artery disease    [name] 12/13/2014  . Depression   . Dysrhythmia    patient unaware of irregular heartbeat  . GERD (gastroesophageal reflux disease)   . High cholesterol    [name] 12/13/2014  . Hypertension   . Non-Q wave myocardial infarction ([place])    [name] 12/13/2014  . PVD (peripheral vascular disease) ([place])    [name] 12/13/2014  . Type II diabetes mellitus [facility])      Patient Active Problem List   Diagnosis Date Noted  . Atherosclerosis of  native arteries of the extremities with ulceration ([place]) 07/03/2019  . Anemia 04/15/2019  . Cellulitis 01/21/2019  . Acute respiratory failure with hypoxia ([place]) 09/17/2018  . History of colonic polyps   . Positive fecal occult blood test   . 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 Atherosclerotic heart disease of native coronary artery without angina pectoris 06/02/2017  . Encounter for screening for depression 06/02/2017  . Gastro-esophageal reflux disease with esophagitis 06/02/2017  . Other asthma 06/02/2017  . Other specified arthritis, unspecified site 06/02/2017  . Acidosis 05/31/2017  . Anemia in chronic kidney disease 05/31/2017  . Hyperkalemia 05/31/2017  . Iron deficiency anemia, unspecified 05/31/2017  . Liver disease, unspecified 05/31/2017  . Moderate protein-calorie malnutrition ([place]) 05/31/2017  . Other dietary vitamin B12 deficiency anemia 05/31/2017  . Other disorders of electrolyte and fluid balance, not elsewhere classified 05/31/2017  . Other disorders resulting from impaired renal tubular function 05/31/2017  . Other hyperlipidemia 05/31/2017  . Other long term (current) drug therapy 05/31/2017  . Secondary hyperparathyroidism of renal origin ([place]) 05/31/2017  . Unspecified jaundice 05/31/2017  . ESRD (end stage renal disease) ([place]) 11/25/2016  . Tobacco abuse 11/25/2016  . Non-ST elevation (NSTEMI) myocardial infarction ([place]) 10/21/2016  . Pure hypercholesterolemia 10/21/2016  . Type 2 diabetes mellitus with other diabetic kidney complication ([place]) 09/38/1829  . Decreased renal function 05/23/2015  .  GERD (gastroesophageal reflux disease) 04/04/2015  . Peripheral vascular disease ([place]) 02/26/2015  . Unspecified systolic (congestive) heart failure ([place]) 12/13/2014     Past Surgical History:  Procedure Laterality Date  . AMPUTATION TOE Left 05/24/2019   Procedure: Left great toe amputation;  Surgeon:  Caroline More, DPM;  Location: [facility] ORS;  Service: Podiatry;  Laterality: Left;  . AV FISTULA PLACEMENT Right 04/07/2017   Procedure: ARTERIOVENOUS (AV) FISTULA CREATION ( BRACHIALCEPHALIC );  Surgeon: Katha Cabal, MD;  Location: [facility] ORS;  Service: Vascular;  Laterality: Right;  . CAPD INSERTION N/A 04/07/2017   Procedure: LAPAROSCOPIC INSERTION CONTINUOUS AMBULATORY PERITONEAL DIALYSIS  (CAPD) CATHETER;  Surgeon: Katha Cabal, MD;  Location: [facility] ORS;  Service: Vascular;  Laterality: N/A;  . CARDIAC CATHETERIZATION  11/2014  . COLONOSCOPY WITH PROPOFOL N/A 12/01/2017   Procedure: COLONOSCOPY WITH PROPOFOL;  Surgeon: Lin Landsman, MD;  Location: [place];  Service: Endoscopy;  Laterality: N/A;  Diabetic - insulin  . COLONOSCOPY WITH PROPOFOL N/A 12/29/2017   Procedure: COLONOSCOPY WITH PROPOFOL;  Surgeon: Lin Landsman, MD;  Location: [place];  Service: Endoscopy;  Laterality: N/A;  . DIALYSIS/PERMA CATHETER INSERTION N/A 01/18/2017   Procedure: Dialysis/Perma Catheter Insertion;  Surgeon: Algernon Huxley, MD;  Location: [place] [facility];  Service: Cardiovascular;  Laterality: N/A;  . DIALYSIS/PERMA CATHETER REMOVAL N/A 07/26/2017   Procedure: DIALYSIS/PERMA CATHETER REMOVAL;  Surgeon: Algernon Huxley, MD;  Location: [place] [facility];  Service: Cardiovascular;  Laterality: N/A;  . INCISION AND DRAINAGE OF WOUND Right ~ 10/2014   "4th toe foot"  . LOWER EXTREMITY ANGIOGRAPHY Left 05/23/2019   Procedure: Lower Extremity Angiography;  Surgeon: Katha Cabal, MD;  Location: [place] [facility];  Service: Cardiovascular;  Laterality: Left;  . PERCUTANEOUS CORONARY STENT INTERVENTION (PCI-S) N/A 12/14/2014   Procedure: PERCUTANEOUS CORONARY STENT INTERVENTION (PCI-S);  Surgeon: Charolette Forward, MD;  Location: [facility] CATH LAB;  Service: Cardiovascular;  Laterality: N/A;  . PERCUTANEOUS CORONARY STENT INTERVENTION (PCI-S) N/A 12/18/2014   Procedure: PERCUTANEOUS  CORONARY STENT INTERVENTION (PCI-S);  Surgeon: Charolette Forward, MD;  [facility];  Service: Cardiovascular;  Laterality: N/A;  . POLYPECTOMY  12/01/2017   Procedure: POLYPECTOMY;  Surgeon: Lin Landsman, MD;  Location: [place];  Service: Endoscopy;;  . POLYPECTOMY  12/29/2017   Procedure: POLYPECTOMY;  Surgeon: Lin Landsman, MD;  Location: [place];  Service: Endoscopy;;  . TOE AMPUTATION Right 2015   4th toe     Prior to Admission medications   Medication Sig Start Date End Date Taking? Authorizing Provider  albuterol (PROVENTIL HFA;VENTOLIN HFA) 108 (90 BASE) MCG/ACT inhaler Inhale 2 puffs into the lungs every 6 (six) hours as needed for wheezing or shortness of breath. 01/10/15   Gladstone Lighter, MD  aspirin EC 81 MG tablet Take 1 tablet (81 mg total) by mouth daily. 01/10/15   Gladstone Lighter, MD  atorvastatin (LIPITOR) 40 MG tablet Take 40 mg by mouth daily. 05/15/19   [provider]  B Complex-C-Folic Acid (DIALYVITE 732) 0.8 MG TABS Take 1 tablet by mouth daily. 10/25/17   [provider]  calcitRIOL (ROCALTROL) 0.25 MCG capsule Take 0.25 mcg by mouth daily.    [provider]  calcium acetate (PHOSLO) 667 MG capsule Take 2,001 mg by mouth 3 (three) times daily with meals. Takes 1-2 caps if he has snacks 06/08/17   [provider]  carvedilol (COREG) 25 MG tablet Take 1 tablet (  25 mg total) by mouth 2 (two) times daily with a meal. 08/30/17   Darylene Price A, FNP  Cholecalciferol (VITAMIN D) 2000 units tablet Take 2,000 Units by mouth daily. Unsure how many units    [provider]  clopidogrel (PLAVIX) 75 MG tablet Take 1 tablet (75 mg total) by mouth daily with breakfast. 05/27/19   Dustin Flock, MD  furosemide (LASIX) 80 MG tablet Take 80 mg by mouth daily. 12/01/18   [provider]  gentamicin cream (GARAMYCIN) 0.1 % Apply 1 application topically daily. 03/02/18   [provider]   hydrALAZINE (APRESOLINE) 25 MG tablet Take 1 tablet (25 mg total) by mouth 3 (three) times daily. Patient taking differently: Take 25 mg by mouth 3 (three) times daily. Pt states he only takes once a day 03/12/18 06/10/18  Fritzi Mandes, MD  HYDROcodone-acetaminophen (NORCO/VICODIN) 5-325 MG tablet Take 1 tablet by mouth every 4 (four) hours as needed. 06/12/19   Harvest Dark, MD  insulin aspart protamine- aspart (NOVOLOG MIX 70/30) (70-30) 100 UNIT/ML injection Inject 0.25 mLs (25 Units total) into the skin 2 (two) times daily with a meal. Patient taking differently: Inject 20-25 Units into the skin 2 (two) times daily with a meal.  03/12/18   Fritzi Mandes, MD  isosorbide mononitrate (IMDUR) 30 MG 24 hr tablet Take 1 tablet (30 mg total) by mouth daily. 06/23/19 09/21/19  Kate Sable, MD  nitroGLYCERIN (NITROSTAT) 0.4 MG SL tablet Place 0.4 mg under the tongue every 5 (five) minutes as needed for chest pain. Pt needs new Rx. Bottle has expried    [provider]  omeprazole (PRILOSEC) 20 MG capsule Take 1 capsule (20 mg total) by mouth daily. 01/10/15   Gladstone Lighter, MD  ONE TOUCH ULTRA TEST test strip  11/25/17   [provider]  tamsulosin (FLOMAX) 0.4 MG CAPS capsule Take 1 capsule (0.4 mg total) by mouth daily. 06/19/19   Hollice Espy, MD     Allergies Patient has no known allergies.   Family History  Problem Relation Age of Onset  . Cancer Mother        Lung  . Cancer Father        Lung  . Diabetes Sister   . Diabetes Brother   . CAD Brother   . Hernia Son   . Cancer Brother     Social History Social History   Tobacco Use  . Smoking status: Former Smoker    Packs/day: 1.00    Years: 30.00    Pack years: 30.00    Types: Cigarettes    Start date: 08/31/1984    Quit date: 11/30/2014    Years since quitting: 4.6  . Smokeless tobacco: Never Used  Substance Use Topics  . Alcohol use: Yes    Comment: Rarely, twice a year.  . Drug use: No     Review of Systems  Constitutional:   No fever or chills.  ENT:   No sore throat. No rhinorrhea. Cardiovascular:   No chest pain or syncope. Respiratory:   No dyspnea or cough. Gastrointestinal:   Negative for abdominal pain, vomiting and diarrhea.  Musculoskeletal:   Left foot wound as above  all other systems reviewed and are negative except as documented above in ROS and HPI.  ____________________________________________   PHYSICAL EXAM:  VITAL SIGNS: ED Triage Vitals  Enc Vitals Group     BP 07/17/19 1225 123/74     Pulse Rate 07/17/19 1225 85  Resp 07/17/19 1225 18     Temp 07/17/19 1217 97.9 F (36.6 C)     Temp Source 07/17/19 1217 Oral     SpO2 07/17/19 1225 97 %     Weight 07/17/19 1219 211 lb (95.7 kg)     Height 07/17/19 1219 5\' 9"  (1.753 m)     Head Circumference --      Peak Flow --      Pain Score 07/17/19 1219 0     Pain Loc --      Pain Edu? --      Excl. in [place]? --     Vital signs reviewed, nursing assessments reviewed.   Constitutional:   Alert and oriented. Non-toxic appearance. Eyes:   Conjunctivae are normal. EOMI. PERRL. ENT      Head:   Normocephalic and atraumatic.      Nose:   Wearing a mask.      Mouth/Throat:   Wearing a mask.      Neck:   No meningismus. Full ROM. Hematological/Lymphatic/Immunilogical:   No cervical lymphadenopathy. Cardiovascular:   RRR. Symmetric bilateral radial and DP pulses.  No murmurs. Cap refill in the left foot about 6 seconds. Respiratory:   Normal respiratory effort without tachypnea/retractions. Breath sounds are clear and equal bilaterally. No wheezes/rales/rhonchi. Gastrointestinal:   Soft and nontender. Non distended. There is no CVA tenderness.  No rebound, rigidity, or guarding.  Musculoskeletal:   Normal range of motion in all extremities. No joint effusions.  There is an approximately 5 cm soft tissue ulceration with exposed necrotic tissue and purulent drainage at the left distal medial foot in  the area of prior amputation.  No crepitus or surrounding erythema.  The second toe is dark purple discolored but does have cap refill and is not cold. Neurologic:   Normal speech and language.  Motor grossly intact. No acute focal neurologic deficits are appreciated.  Skin:    Skin is warm, dry with wound as above.. No rash noted.  No petechiae, purpura, or bullae.  ____________________________________________    LABS (pertinent positives/negatives) (all labs ordered are listed, but only abnormal results are displayed) Labs Reviewed  BASIC METABOLIC PANEL  CBC WITH DIFFERENTIAL/PLATELET  PROTIME-INR   ____________________________________________   EKG    ____________________________________________    RADIOLOGY  No results found.  ____________________________________________   PROCEDURES Procedures  ____________________________________________    CLINICAL IMPRESSION / ASSESSMENT AND PLAN / ED COURSE  Medications ordered in the ED: Medications - No data to display  Pertinent labs & imaging results that were available during my care of the patient were reviewed by me and considered in my medical decision making (see chart for details).  MALACHY COLEMAN was evaluated in Emergency Department on 07/17/2019 for the symptoms described in the history of present illness. He was evaluated in the context of the global COVID-19 pandemic, which necessitated consideration that the patient might be at risk for infection with the SARS-CoV-2 virus that causes COVID-19. Institutional protocols and algorithms that pertain to the evaluation of patients at risk for COVID-19 are in a state of rapid change based on information released by regulatory bodies including the CDC and federal and state organizations. These policies and algorithms were followed during the patient's care in the ED.     Clinical Course as of Jul 17 1255  Mon Jul 17, 2019  1255 Patient presents with peripheral  arterial disease, chronic ischemia of left foot and nonhealing wound.  Discussed with Dr. Lazaro Arms vascular  surgery who recommends admission and will proceed with vascular intervention tomorrow.   [PS]    Clinical Course User Index [PS] Carrie Mew, MD     ----------------------------------------- 1:01 PM on 07/17/2019 -----------------------------------------  Ceftriaxone and vancomycin ordered for wound infection.  X-ray to evaluate for obvious signs of osteomyelitis.  ____________________________________________   FINAL CLINICAL IMPRESSION(S) / ED DIAGNOSES    Final diagnoses:  PAD (peripheral artery disease) (HCC)  Ulcerated, foot, right, with necrosis of muscle [facility])     ED Discharge Orders    None      Portions of this note were generated with dragon dictation software. Dictation errors may occur despite best attempts at proofreading.   Carrie Mew, MD 07/17/19 1301

## 2019-07-17 NOTE — Consult Note (Signed)
PHARMACY -  BRIEF ANTIBIOTIC NOTE   Pharmacy has received consult(s) for Vancomycin (cellulitis) from an ED provider.  The patient's profile has been reviewed for ht/wt/allergies/indication/available labs.    One time order(s) placed for Vancomycin 1g x1 dose in the ED.   Further antibiotics/pharmacy consults should be ordered by admitting physician if indicated.                       Thank you, Rowland Lathe 07/17/2019  1:06 PM

## 2019-07-17 NOTE — ED Notes (Signed)
Surgeon at bedside.  

## 2019-07-17 NOTE — Progress Notes (Signed)
Pharmacy Antibiotic Note  Scott [name] is a 58 y.o. male admitted on 07/17/2019 with osteomyelitis.  Pharmacy has been consulted for vancomycin dosing.  Plan: patient last received a dose of vancomycin 1g IV x 1 on 11/16 @ 1830  Patient has a h/o ESRD on peritoneal dialysis nightly per RN. Will check a random vanc level w/ am labs to assess vanc level. Expect level to be elevated since vanc not effectively cleared by PD. Goal level < 20 mcg/mL.  Will re-dose vanc once random level is below the goal cutoff of 20 mcg/mL.  Height: 5\' 9"  (175.3 cm) Weight: 210 lb 5.1 oz (95.4 kg) IBW/kg (Calculated) : 70.7  Temp (24hrs), Avg:98 F (36.7 C), Min:97.8 F (36.6 C), Max:98.3 F (36.8 C)  Recent Labs  Lab 07/17/19 1359 07/17/19 1530 07/17/19 1823  WBC 10.6*  --   --   CREATININE 7.24*  --   --   LATICACIDVEN  --  1.3 1.4    Estimated Creatinine Clearance: 12.4 mL/min (A) (by C-G formula based on SCr of 7.24 mg/dL (H)).    No Known Allergies  Thank you for allowing pharmacy to be a part of this patient's care.  Tobie Lords, PharmD, BCPS Clinical Pharmacist 07/17/2019 11:57 PM

## 2019-07-18 ENCOUNTER — Encounter
Admission: EM | Disposition: A | Discharge status: Home Health | Payer: Self-pay | Source: Ambulatory Visit | Attending: Internal Medicine

## 2019-07-18 ENCOUNTER — Ambulatory Visit
Admission: RE | Admit: 2019-07-18 | Payer: Commercial Managed Care - HMO | Source: Home / Self Care | Admitting: Vascular Surgery

## 2019-07-18 DIAGNOSIS — M869 Osteomyelitis, unspecified: Secondary | ICD-10-CM | POA: Diagnosis not present

## 2019-07-18 DIAGNOSIS — L97513 Non-pressure chronic ulcer of other part of right foot with necrosis of muscle: Secondary | ICD-10-CM

## 2019-07-18 DIAGNOSIS — I2510 Atherosclerotic heart disease of native coronary artery without angina pectoris: Secondary | ICD-10-CM | POA: Diagnosis not present

## 2019-07-18 DIAGNOSIS — I739 Peripheral vascular disease, unspecified: Secondary | ICD-10-CM | POA: Diagnosis not present

## 2019-07-18 DIAGNOSIS — N186 End stage renal disease: Secondary | ICD-10-CM | POA: Diagnosis not present

## 2019-07-18 DIAGNOSIS — I70262 Atherosclerosis of native arteries of extremities with gangrene, left leg: Secondary | ICD-10-CM

## 2019-07-18 HISTORY — PX: LOWER EXTREMITY ANGIOGRAPHY: CATH118251

## 2019-07-18 LAB — POTASSIUM: Potassium: 4.7 mmol/L (ref 3.5–5.1)

## 2019-07-18 LAB — GLUCOSE, CAPILLARY
Glucose-Capillary: 95 mg/dL (ref 70–99)
Glucose-Capillary: 96 mg/dL (ref 70–99)
Glucose-Capillary: 115 mg/dL — ABNORMAL HIGH (ref 70–99)

## 2019-07-18 LAB — CK: Total CK: 88 U/L (ref 49–397)

## 2019-07-18 LAB — VANCOMYCIN, RANDOM: Vancomycin Rm: 12

## 2019-07-18 LAB — SURGICAL PCR SCREEN
MRSA, PCR: NEGATIVE
Staphylococcus aureus: NEGATIVE

## 2019-07-18 SURGERY — LOWER EXTREMITY ANGIOGRAPHY
Anesthesia: Moderate Sedation | Laterality: Left

## 2019-07-18 SURGERY — LOWER EXTREMITY ANGIOGRAPHY
Anesthesia: Moderate Sedation | Site: Leg Lower | Laterality: Left

## 2019-07-18 MED ORDER — MIDAZOLAM HCL 2 MG/ML PO SYRP: 8.0000 mg | ORAL_SOLUTION | Freq: Once | ORAL | Status: DC | PRN

## 2019-07-18 MED ORDER — VITAMIN C 500 MG PO TABS
500.0000 mg | ORAL_TABLET | Freq: Two times a day (BID) | ORAL | Status: DC
  Administered 2019-07-18 – 2019-07-21 (×5): 500 mg via ORAL
  Filled 2019-07-18 (×5): qty 1

## 2019-07-18 MED ORDER — MIDAZOLAM HCL 2 MG/2ML IJ SOLN
INTRAMUSCULAR | Status: AC
  Administered 2019-07-18: 12:00:00
  Filled 2019-07-18: qty 2

## 2019-07-18 MED ORDER — INSULIN ASPART 100 UNIT/ML ~~LOC~~ SOLN
0.0000 [IU] | Freq: Four times a day (QID) | SUBCUTANEOUS | Status: DC
  Administered 2019-07-19: 2 [IU] via SUBCUTANEOUS
  Administered 2019-07-19: 1 [IU] via SUBCUTANEOUS
  Administered 2019-07-20 (×3): 3 [IU] via SUBCUTANEOUS
  Filled 2019-07-18 (×5): qty 1

## 2019-07-18 MED ORDER — SODIUM CHLORIDE 0.9 % IV SOLN: INTRAVENOUS | Status: DC

## 2019-07-18 MED ORDER — ONDANSETRON HCL 4 MG/2ML IJ SOLN
4.0000 mg | Freq: Four times a day (QID) | INTRAMUSCULAR | Status: DC | PRN
  Administered 2019-07-18: 4 mg via INTRAVENOUS

## 2019-07-18 MED ORDER — OXYCODONE HCL 5 MG PO TABS
5.0000 mg | ORAL_TABLET | ORAL | Status: DC | PRN
  Administered 2019-07-19 – 2019-07-20 (×2): 10 mg via ORAL
  Filled 2019-07-18 (×2): qty 2

## 2019-07-18 MED ORDER — SODIUM CHLORIDE 0.9 % IV SOLN: 250.0000 mL | INTRAVENOUS | Status: DC | PRN

## 2019-07-18 MED ORDER — HEPARIN SODIUM (PORCINE) 1000 UNIT/ML DIALYSIS
500.0000 [IU] | INTRAMUSCULAR | Status: DC | PRN
  Filled 2019-07-18: qty 1

## 2019-07-18 MED ORDER — METHYLPREDNISOLONE SODIUM SUCC 125 MG IJ SOLR: 125.0000 mg | Freq: Once | INTRAMUSCULAR | Status: DC | PRN

## 2019-07-18 MED ORDER — RENA-VITE PO TABS
1.0000 | ORAL_TABLET | Freq: Every day | ORAL | Status: DC
  Administered 2019-07-20 – 2019-07-21 (×2): 1 via ORAL
  Filled 2019-07-18 (×2): qty 1

## 2019-07-18 MED ORDER — HEPARIN (PORCINE) 25000 UT/250ML-% IV SOLN
1100.0000 [IU]/h | INTRAVENOUS | Status: AC
  Administered 2019-07-18: 1000 [IU]/h via INTRAVENOUS
  Filled 2019-07-18: qty 250

## 2019-07-18 MED ORDER — FENTANYL CITRATE (PF) 100 MCG/2ML IJ SOLN
INTRAMUSCULAR | Status: AC
  Administered 2019-07-18: 11:00:00
  Filled 2019-07-18: qty 2

## 2019-07-18 MED ORDER — MIDAZOLAM HCL 5 MG/5ML IJ SOLN
INTRAMUSCULAR | Status: AC
  Administered 2019-07-18: 10:00:00
  Filled 2019-07-18: qty 5

## 2019-07-18 MED ORDER — FENTANYL CITRATE (PF) 100 MCG/2ML IJ SOLN
INTRAMUSCULAR | Status: AC
  Administered 2019-07-18: 10:00:00
  Filled 2019-07-18: qty 2

## 2019-07-18 MED ORDER — MUPIROCIN 2 % EX OINT
1.0000 "application " | TOPICAL_OINTMENT | Freq: Two times a day (BID) | CUTANEOUS | Status: DC
  Administered 2019-07-19 – 2019-07-21 (×6): 1 via NASAL
  Filled 2019-07-18 (×2): qty 22

## 2019-07-18 MED ORDER — FENTANYL CITRATE (PF) 100 MCG/2ML IJ SOLN
INTRAMUSCULAR | Status: AC
  Filled 2019-07-18: qty 2

## 2019-07-18 MED ORDER — MORPHINE SULFATE (PF) 4 MG/ML IV SOLN: 2.0000 mg | INTRAVENOUS | Status: DC | PRN

## 2019-07-18 MED ORDER — MIDAZOLAM HCL 2 MG/2ML IJ SOLN
INTRAMUSCULAR | Status: AC
  Filled 2019-07-18: qty 2

## 2019-07-18 MED ORDER — SODIUM CHLORIDE 0.9 % IV SOLN
INTRAVENOUS | Status: DC
  Administered 2019-07-18: 09:00:00 via INTRAVENOUS

## 2019-07-18 MED ORDER — ONDANSETRON HCL 4 MG/2ML IJ SOLN
INTRAMUSCULAR | Status: AC
  Filled 2019-07-18: qty 2

## 2019-07-18 MED ORDER — FENTANYL CITRATE (PF) 100 MCG/2ML IJ SOLN
INTRAMUSCULAR | Status: DC | PRN
  Administered 2019-07-18 (×2): 50 ug via INTRAVENOUS
  Administered 2019-07-18 (×2): 25 ug via INTRAVENOUS
  Administered 2019-07-18: 50 ug via INTRAVENOUS

## 2019-07-18 MED ORDER — FAMOTIDINE 20 MG PO TABS: 40.0000 mg | ORAL_TABLET | Freq: Once | ORAL | Status: DC | PRN

## 2019-07-18 MED ORDER — HEPARIN SODIUM (PORCINE) 1000 UNIT/ML IJ SOLN
INTRAMUSCULAR | Status: AC
  Administered 2019-07-18: 10:00:00
  Filled 2019-07-18: qty 1

## 2019-07-18 MED ORDER — SODIUM CHLORIDE 0.9% FLUSH
3.0000 mL | Freq: Two times a day (BID) | INTRAVENOUS | Status: DC
  Administered 2019-07-18 – 2019-07-21 (×6): 3 mL via INTRAVENOUS

## 2019-07-18 MED ORDER — HEPARIN SODIUM (PORCINE) 1000 UNIT/ML IJ SOLN
INTRAMUSCULAR | Status: DC | PRN
  Administered 2019-07-18: 5000 [IU] via INTRAVENOUS

## 2019-07-18 MED ORDER — GENTAMICIN SULFATE 0.1 % EX CREA
1.0000 "application " | TOPICAL_CREAM | Freq: Every day | CUTANEOUS | Status: DC
  Administered 2019-07-18 – 2019-07-21 (×4): 1 via TOPICAL
  Filled 2019-07-18: qty 15

## 2019-07-18 MED ORDER — IODIXANOL 320 MG/ML IV SOLN
INTRAVENOUS | Status: DC | PRN
  Administered 2019-07-18: 105 mL via INTRA_ARTERIAL

## 2019-07-18 MED ORDER — DIPHENHYDRAMINE HCL 50 MG/ML IJ SOLN: 50.0000 mg | Freq: Once | INTRAMUSCULAR | Status: DC | PRN

## 2019-07-18 MED ORDER — VANCOMYCIN HCL 1.25 G IV SOLR
1250.0000 mg | Freq: Once | INTRAVENOUS | Status: AC
  Administered 2019-07-18: 1250 mg via INTRAVENOUS
  Filled 2019-07-18 (×2): qty 1250

## 2019-07-18 MED ORDER — NEPRO/CARBSTEADY PO LIQD
237.0000 mL | Freq: Two times a day (BID) | ORAL | Status: DC
  Administered 2019-07-20 – 2019-07-21 (×4): 237 mL via ORAL

## 2019-07-18 MED ORDER — HEPARIN BOLUS VIA INFUSION
2000.0000 [IU] | Freq: Once | INTRAVENOUS | Status: AC
  Administered 2019-07-18: 2000 [IU] via INTRAVENOUS
  Filled 2019-07-18: qty 2000

## 2019-07-18 MED ORDER — SODIUM CHLORIDE 0.9% FLUSH: 3.0000 mL | INTRAVENOUS | Status: DC | PRN

## 2019-07-18 MED ORDER — CEFAZOLIN SODIUM-DEXTROSE 1-4 GM/50ML-% IV SOLN
1.0000 g | Freq: Once | INTRAVENOUS | Status: AC
  Administered 2019-07-18: 1 g via INTRAVENOUS

## 2019-07-18 MED ORDER — MIDAZOLAM HCL 2 MG/2ML IJ SOLN
INTRAMUSCULAR | Status: DC | PRN
  Administered 2019-07-18 (×4): 1 mg via INTRAVENOUS
  Administered 2019-07-18: 2 mg via INTRAVENOUS

## 2019-07-18 MED ORDER — HYDROMORPHONE HCL 1 MG/ML IJ SOLN: 1.0000 mg | Freq: Once | INTRAMUSCULAR | Status: DC | PRN

## 2019-07-18 MED ORDER — SODIUM CHLORIDE 0.9 % IV SOLN
2.0000 g | INTRAVENOUS | Status: DC
  Administered 2019-07-19 – 2019-07-20 (×2): 2 g via INTRAVENOUS
  Filled 2019-07-18 (×2): qty 20
  Filled 2019-07-18 (×2): qty 2

## 2019-07-18 SURGICAL SUPPLY — 32 items
BALLN ULTRASCOR 014 3.5X40X150 (BALLOONS) ×3
BALLN ULTRSCOR 014 2.5X100X150 (BALLOONS) ×3
BALLN ULTRVRSE 2X40X150 (BALLOONS) ×3
BALLN ULTRVRSE 3X40X150 (BALLOONS) ×2
BALLN ULTRVRSE 3X40X150 OTW (BALLOONS) ×1
BALLOON ULTRSC 014 2.5X100X150 (BALLOONS) ×1 IMPLANT
BALLOON ULTRSCR 014 3.5X40X150 (BALLOONS) ×1 IMPLANT
BALLOON ULTRVRSE 2X40X150 (BALLOONS) ×1 IMPLANT
BALLOON ULTRVRSE 3X40X150 OTW (BALLOONS) ×1 IMPLANT
CANISTER PENUMBRA ENGINE (MISCELLANEOUS) ×3 IMPLANT
CATH BEACON 5 .035 65 RIM TIP (CATHETERS) ×3 IMPLANT
CATH CXI SUPP 2.6F 150 ST (CATHETERS) ×3 IMPLANT
CATH INDIGO CAT6 KIT (CATHETERS) ×3 IMPLANT
CATH PIG 70CM (CATHETERS) ×3 IMPLANT
CATH SEEKER .018X150 (CATHETERS) ×3 IMPLANT
CATH VERT 5FR 125CM (CATHETERS) ×3 IMPLANT
COVER PROBE U/S 5X48 (MISCELLANEOUS) ×3 IMPLANT
DEVICE PRESTO INFLATION (MISCELLANEOUS) ×3 IMPLANT
DEVICE STARCLOSE SE CLOSURE (Vascular Products) ×3 IMPLANT
ENSNARE 9-15 (MISCELLANEOUS) ×3 IMPLANT
GLIDEWIRE ADV .035X260CM (WIRE) ×6 IMPLANT
GUIDEWIRE PFTE-COATED .018X300 (WIRE) ×3 IMPLANT
NEEDLE ENTRY 21GA 7CM ECHOTIP (NEEDLE) ×3 IMPLANT
PACK ANGIOGRAPHY (CUSTOM PROCEDURE TRAY) ×3 IMPLANT
SET INTRO CAPELLA COAXIAL (SET/KITS/TRAYS/PACK) ×3 IMPLANT
SHEATH ANL2 6FRX45 HC (SHEATH) ×3 IMPLANT
SHEATH BRITE TIP 5FRX11 (SHEATH) ×3 IMPLANT
SHEATH FLEXOR ANSEL2 7FRX45 (SHEATH) ×6 IMPLANT
STENT SYNERGY DES 3.5X24 (Permanent Stent) ×3 IMPLANT
VALVE HEMO TOUHY BORST Y (ADAPTER) ×3 IMPLANT
WIRE J 3MM .035X145CM (WIRE) ×3 IMPLANT
WIRE RUNTHROUGH .014X300CM (WIRE) ×3 IMPLANT

## 2019-07-18 NOTE — Progress Notes (Addendum)
Initial Nutrition Assessment  DOCUMENTATION CODES:   Obesity unspecified  INTERVENTION:  Liberalize Diet Double protein at meals Vitamin C 500mg  BID Continue Renavit NUTRITION DIAGNOSIS:   Moderate Malnutrition related to chronic illness(ESRD on peritoneal dialysis) as evidenced by mild fat depletion, moderate fat depletion, mild muscle depletion, moderate muscle depletion.  GOAL:   Patient will meet greater than or equal to 90% of their needs  MONITOR:   PO intake, Weight trends, Skin, I & O's  REASON FOR ASSESSMENT:   Consult Assessment of nutrition requirement/status  ASSESSMENT:   [name] with PMH significant of great left toe amputation, PAD, diabetes mellitus type 2, end-stage renal disease on peritoneal dialysis, GERD, PVD, HLD, HTN, CKD IV, and anemia of chronic disease, Admitted for PAD and osteomyelitis of left foot.  Pt s/p angiography of left leg on 11/17. Pt was eating meal tray at time of assessment. Reported good appetite and denies nausea. Stated that he wished they would give him more options on the menu and was educated on the carbohydrate mod diet and the sodium, potassium and fluid restrictions. Pt stated, "yeah but I don't eat enough for it to hurt me."  Reported a reduced appetite over the past 6 months and that his usual body weight was 250#. According to his documented weight history, the highest weight recorded over the past 16 months is 228# and his current weight is 209#. Weight loss of 8% over one year is not significant, but pt was educated on keeping track of weight and to let PCP know of a weight gain of 3# in one day or 5# in one week. Pt was educated on importance of meeting calorie and protein needs to aid in wound healing, but expect poor compliance with supplements related to patient's level of understanding.  Typical meal pattern: Breakfast: eggs, grits or oatmeal Lunch: [place] sandwich, dinner: spaghetti, meatloaf, or some home cooked meal.   Pt lives at home with his girlfriend and endorses that they share in cooking the meals at home, and don't often go out for food.  Labs reviewed: CBG 95-96, Potassium 4.7, sodium 135 Medications reviewed: heparin, insulin 0-9 units  NUTRITION - FOCUSED PHYSICAL EXAM:    Most Recent Value  Orbital Region  Moderate depletion  Upper Arm Region  Mild depletion  Thoracic and Lumbar Region  Mild depletion  Buccal Region  No depletion  Temple Region  Moderate depletion  Clavicle Bone Region  Mild depletion  Clavicle and Acromion Bone Region  Mild depletion  Scapular Bone Region  No depletion  Dorsal Hand  Moderate depletion  Patellar Region  Moderate depletion  Anterior Thigh Region  Moderate depletion  Posterior Calf Region  Mild depletion  Edema (RD Assessment)  Mild  Hair  Reviewed  Eyes  Reviewed  Mouth  Reviewed  Skin  Reviewed  Nails  Reviewed       Diet Order:   Diet Order            Diet NPO time specified  Diet effective 1000        Diet renal/carb modified with fluid restriction Diet-HS Snack? Nothing; Fluid restriction: 1200 mL Fluid; Room service appropriate? Yes; Fluid consistency: Thin  Diet effective now              EDUCATION NEEDS:   Education needs have been addressed  Skin:  Skin Assessment: Skin Integrity Issues: Skin Integrity Issues:: Diabetic Ulcer Diabetic Ulcer: osteomyelitis of left foot  Last BM:  11/14  Height:   Ht Readings from Last 1 Encounters:  07/17/19 5\' 9"  (1.753 m)    Weight:   Wt Readings from Last 1 Encounters:  07/17/19 95.4 kg    Ideal Body Weight:  72.7 kg  BMI:  Body mass index is 31.06 kg/m.  Estimated Nutritional Needs:   Kcal:  1900-2100  Protein:  100-115 grams  Fluid:  </= 1.5 L   Meda Klinefelter, Dietetic Intern

## 2019-07-18 NOTE — Progress Notes (Signed)
Day of Surgery   Subjective/Chief Complaint: Patient seen.  No complaints of pain.  Just had his revascularization procedure.   Objective: Vital signs in last 24 hours: Temp:  [97.4 F (36.3 C)-98.3 F (36.8 C)] 97.7 F (36.5 C) (11/17 1420) Pulse Rate:  [65-89] 69 (11/17 1420) Resp:  [11-22] 17 (11/17 1420) BP: (83-134)/(43-79) 99/49 (11/17 1420) SpO2:  [92 %-100 %] 93 % (11/17 1420) Weight:  [95.4 kg] 95.4 kg (11/16 1541) Last BM Date: 07/15/19  Intake/Output from previous day: No intake/output data recorded. Intake/Output this shift: No intake/output data recorded.  DP pulse is trace on the left, not clearly palpable on the right.  PT pulse could not clearly be palpated bilateral.  Continued full-thickness necrotic wound on the left foot at the first ray amputation site with exposure of bone at the end of the first metatarsal.  Capsule is present through the base of the wound at the second metatarsal phalangeal joint.  Second toe is dusky and cool to the touch.  The medial wound measures approximately 6 cm x 2-1/2 cm.  Lab Results:  Recent Labs    07/17/19 1359  WBC 10.6*  HGB 9.6*  HCT 30.4*  PLT 361   BMET Recent Labs    07/17/19 1359 07/18/19 0857  NA 135  --   K 4.7 4.7  CL 98  --   CO2 22  --   GLUCOSE 128*  --   BUN 47*  --   CREATININE 7.24*  --   CALCIUM 8.1*  --    PT/INR Recent Labs    07/17/19 1359  LABPROT 14.7  INR 1.2   ABG No results for input(s): PHART, HCO3 in the last 72 hours.  Invalid input(s): PCO2, PO2  Studies/Results: Dg Foot 2 Views Left  Result Date: 07/17/2019 CLINICAL DATA:  Nonhealing wound of the left foot with perianal and drainage. History of partial amputation. EXAM: LEFT FOOT - 2 VIEW COMPARISON:  Radiographs 05/24/2019 and 05/22/2019. FINDINGS: Stable postsurgical changes following amputation through the base of the 1st metatarsal. There is new poor cortical definition of the 2nd metatarsal head. No progressive  destruction of the 1st metatarsal remnant identified. There is a stable prominent plantar calcaneal spur and prominent diffuse vascular calcifications. No unexpected foreign bodies. IMPRESSION: 1. New poor cortical definition of the 2nd metatarsal head suspicious for osteomyelitis. 2. Stable postsurgical changes following amputation through the base of the 1st metatarsal. Electronically Signed   By: Richardean Sale M.D.   On: 07/17/2019 14:38    Anti-infectives: Anti-infectives (From admission, onward)   Start     Dose/Rate Route Frequency Ordered Stop   07/18/19 1400  cefTRIAXone (ROCEPHIN) 2 g in sodium chloride 0.9 % 100 mL IVPB     2 g 200 mL/hr over 30 Minutes Intravenous Every 24 hours 07/18/19 0000     07/18/19 0845  ceFAZolin (ANCEF) IVPB 1 g/50 mL premix     1 g 100 mL/hr over 30 Minutes Intravenous  Once 07/18/19 0833 07/18/19 1044   07/18/19 0700  vancomycin (VANCOCIN) 1,250 mg in sodium chloride 0.9 % 250 mL IVPB     1,250 mg 166.7 mL/hr over 90 Minutes Intravenous  Once 07/18/19 0653     07/17/19 1300  vancomycin (VANCOCIN) IVPB 1000 mg/200 mL premix     1,000 mg 200 mL/hr over 60 Minutes Intravenous  Once 07/17/19 1257 07/18/19 0010   07/17/19 1300  cefTRIAXone (ROCEPHIN) 2 g in sodium chloride 0.9 % 100 mL  IVPB     2 g 200 mL/hr over 30 Minutes Intravenous  Once 07/17/19 1257 07/17/19 1648      Assessment/Plan: s/p Procedure(s): LOWER EXTREMITY ANGIOGRAPHY (Left) Assessment: Peripheral vascular disease with gangrenous changes left second toe and forefoot with nonhealing previous first ray amputation.   Plan: Saline wet-to-dry dressing reapplied to the wound on the left foot.  I did discuss this patient with Dr. [name] and due to the patient's small vessel disease in the forefoot he felt like a transmetatarsal amputation would have a high likelihood of not healing.  Discussed a more limited debridement of the left foot with second ray amputation and at this point it is  felt like this may be the better option for now.  Would like to try to bring him back in the near future for revascularization of his peroneal artery.  Discussed with the patient the above procedure which would include debriding some additional first metatarsal and the wound as well as second ray amputation.  Discussed risks and complications of the procedure including continued inability to heal due to his diabetes or peripheral vascular disease or infection.  Discussed that with his small vessel disease he is still at a high risk for below-knee amputation which would have a much better healing potential.  Questions invited and answered.  Patient elects to proceed with the above-mentioned procedures.  We will plan for this tomorrow evening after work.  N.p.o. after breakfast.  Consent form for debridement soft tissue and bone left foot with second ray amputation.  Plan for surgery tomorrow evening.  LOS: 1 day    Scott [name] 07/18/2019

## 2019-07-18 NOTE — Op Note (Signed)
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 Introduction catheter into left lower extremity 3rd order catheter placement  2. Contrast injection left lower extremity for distal runoff with additional 3rd order  3. Retrieval portion of CXI catheter from left SFA with a trilobed snare              4. Percutaneous transluminal angioplasty and stent placement left anterior tibial  5. Thromboembolectomy with the penumbra cat 6 device left anterior tibial              6.  Star close closure right common femoral arteriotomy  Anesthesia: Conscious sedation was administered under my direct supervision by the interventional radiology RN. IV Versed plus fentanyl were utilized. Continuous ECG, pulse oximetry and blood pressure was monitored throughout the entire procedure.  Conscious sedation was for a total of 160 minutes.  Sheath: 7 [place] Ansell right common femoral retrograde  Contrast: 145 cc  Fluoroscopy Time: 20.1 minutes  Indications: Scott [name] presents with increasing pain and worsening gangrene of the left lower extremity.  He has known atherosclerotic occlusive disease and has not undergone complicated multilevel intervention in the past.  This suggests the patient is having limb threatening ischemia and is at significant risk for limb loss.  Angiography with intervention is recommended. The risks and benefits are reviewed all questions answered patient agrees to proceed.  Procedure:Scott [name] is a 58 y.o. y.o. male who was identified and appropriate  procedural time out was performed. The patient was then placed supine on the table and prepped and draped in the usual sterile fashion.   Ultrasound was placed in the sterile sleeve and the right groin was evaluated the right common femoral artery was echolucent and pulsatile indicating patency. Image was recorded for the permanent record and under real-time visualization a microneedle was inserted into the common femoral artery followed by the microwire and then the micro-sheath. A J-wire was then advanced through the micro-sheath and a 5 [place] sheath was then inserted over a J-wire.  A rim catheter was positioned to above the bifurcation and a RAL view of the pelvis was obtained. Subsequently a rim catheter with the advantage wire was used to cross the aortic bifurcation the catheter wire were advanced down into the left distal external iliac artery. Oblique view of the femoral bifurcation was then obtained and subsequently the wire was reintroduced and the pigtail catheter negotiated into the SFA representing third order catheter placement. Distal runoff was then performed.  Diagnostic interpretation: The distal aorta is visualized and is widely patent the common iliac arteries and external iliac arteries are heavily calcified and easily visualized under plain fluoroscopy however they are patent bilaterally without hemodynamically significant stenosis.  The right internal iliac artery is occluded the left internal iliac artery is heavily diseased.  The left common femoral artery demonstrates moderate disease.  The profunda femoris demonstrates a greater than 90% stenosis at its origin distally it is heavily diseased but patent.  The SFA demonstrates a 60% stenosis at its origin.  There are diffuse stenoses throughout the SFA previously placed stent is noted.  I do not identify any lesions that are greater than 60% except for the above-noted 60% stenosis at the origin.  Of note, markers consistent  with the distal portion of a CXI catheter are identified including what appears to be the catheter tip located at the level of the previously placed stent.  CX eye catheter was utilized in the procedure a month ago.  The popliteal is patent and free of hemodynamically significant stenosis.  There is an aberrant takeoff of the anterior tibial behind the knee which then extends around and courses from medial to lateral and then adopts a more typical course.  There are 2 subtotal occlusions within the anterior tibial proximally distally at the level of the ankle it is again occluded.  The tibioperoneal trunk is heavily diseased with multiple occlusions.  There is complete nonvisualization of the posterior tibial throughout its entire course.  Peroneal is noted previously on the angiogram but is not well visualized today.  Based on these findings I elected to intervene and felt the first priority was to retrieve the retained segment of CXI catheter  5000 units of heparin was then given and allowed to circulate and a 6 [place] was initially placed but then given the need for snare and retrieval of the foreign body I upsized to a 7 [employer] sheath which was advanced up and over the bifurcation and positioned in common the femoral artery  [name] catheter and stiff angle Glidewire were then negotiated down into the SFA stent.  A trilobed snare was then advanced just past the tip of the CXI catheter and slowly retracted capturing the catheter which was then pulled back into the 7 French sheath.  Under fluoroscopic evaluation all of the markers that were previously noted are now well within the 7 French sheath however I cannot pull this system through the valve.  I therefore advanced an advantage wire through the 7 French sheath and positioned the wire tip in the distal left SFA.  Having secured wire access I now removed the 7 [place] Ansell sheath with the snare and the retained catheter inside.  Once it was  completely outside the body it was removed from the advantage wire.  The retained catheter segment was inspected on the back table while pressure was held on the right groin once the catheter piece had been verified to have been removed a new 7 [place] Ansel sheath was advanced up and over catheter was then advanced.  Attention was now turned to the intervention wire catheter combination were negotiated into the anterior tibial and then down all the way to the dorsalis pedis.  A series of balloon inflations were then performed beginning distally and moving proximally initial inflations were 2 mm balloons more proximally 3-1/2 mm ultra score balloons were used.  All but one segment dilated quite nicely with less than 10% residual stenosis.  However this 1 segment proximally even with a 3-1/2 mm ultra score balloon did not yield.  I therefore deployed a 3-1/2 x 24 mm drug-eluting coronary stent.  Inflation was to 12 atm for 30 seconds.  Follow-up imaging of the anterior tibial now demonstrate thrombus located at the proximal edge of the stent.  Attempts at reangioplastied this area with a 3 mm balloon resulted in the thrombus migrating to the mid portion of the stent.  I therefore elected to use the penumbra cat 6 device.  This was prepped on the field advanced over the 014 wire and 3 passes were made through the stent itself.  Aspiration did receive a portion of thrombus in the specimen container.  Follow-up imaging demonstrated wide  patency with less than 5% residual stenosis proximally and total resolution of any residual thrombus and less than 10% residual stenosis distally.  After review of these images the sheath is pulled into the right external iliac oblique of the common femoral is obtained and a Star close device deployed. There no immediate Complications.  Findings:  The distal aorta is visualized and is widely patent the common iliac arteries and external iliac arteries are heavily calcified and  easily visualized under plain fluoroscopy however they are patent bilaterally without hemodynamically significant stenosis.  The right internal iliac artery is occluded the left internal iliac artery is heavily diseased.  The left common femoral artery demonstrates moderate disease.  The profunda femoris demonstrates a greater than 90% stenosis at its origin distally it is heavily diseased but patent.  The SFA demonstrates a 60% stenosis at its origin.  There are diffuse stenoses throughout the SFA previously placed stent is noted.  I do not identify any lesions that are greater than 60% except for the above-noted 60% stenosis at the origin.  Of note, markers consistent with the distal portion of a CXI catheter are identified including what appears to be the catheter tip located at the level of the previously placed stent.  CX eye catheter was utilized in the procedure a month ago.  The popliteal is patent and free of hemodynamically significant stenosis.  There is an aberrant takeoff of the anterior tibial behind the knee which then extends around and courses from medial to lateral and then adopts a more typical course.  There are 2 subtotal occlusions within the anterior tibial proximally distally at the level of the ankle it is again occluded.  The tibioperoneal trunk is heavily diseased with multiple occlusions.  There is complete nonvisualization of the posterior tibial throughout its entire course.  Peroneal is noted previously on the angiogram but is not well visualized today.  Retrieval of the retained portion of CXI catheter is successful without complication.  Recanalization of the anterior tibial is successful with placement of a coronary stent proximally and successful thrombectomy and recanalization of the occlusion distally filling the dorsalis pedis and the pedal arch.  Summary: Successful recanalization left lower extremity for limb salvage   Disposition: Patient  was taken to the recovery room in stable condition having tolerated the procedure well.  Scott [name] 07/18/2019,12:58 PM

## 2019-07-18 NOTE — Progress Notes (Signed)
Pharmacy Antibiotic Note  Scott [name] is a 58 y.o. male admitted on 07/17/2019 with osteomyelitis.  Pharmacy has been consulted for vancomycin dosing.  Plan: 11/17 @ 0500 VR 12 mcg/mL. Will redose patient w/ vanc 15 mg/kg and will recheck random level and continue to dose per levels since patient is on PD. Goal level < 20 mcg/mL.  Will re-dose vanc once random level is below the goal cutoff of 20 mcg/mL.  Height: 5\' 9"  (175.3 cm) Weight: 210 lb 5.1 oz (95.4 kg) IBW/kg (Calculated) : 70.7  Temp (24hrs), Avg:97.9 F (36.6 C), Min:97.7 F (36.5 C), Max:98.3 F (36.8 C)  Recent Labs  Lab 07/17/19 1359 07/17/19 1530 07/17/19 1823 07/18/19 0522  WBC 10.6*  --   --   --   CREATININE 7.24*  --   --   --   LATICACIDVEN  --  1.3 1.4  --   VANCORANDOM  --   --   --  12    Estimated Creatinine Clearance: 12.4 mL/min (A) (by C-G formula based on SCr of 7.24 mg/dL (H)).    No Known Allergies  Thank you for allowing pharmacy to be a part of this patient's care.  Tobie Lords, PharmD, BCPS Clinical Pharmacist 07/18/2019 6:55 AM

## 2019-07-18 NOTE — H&P (View-Only) (Signed)
Day of Surgery   Subjective/Chief Complaint: Patient seen.  No complaints of pain.  Just had his revascularization procedure.   Objective: Vital signs in last 24 hours: Temp:  [97.4 F (36.3 C)-98.3 F (36.8 C)] 97.7 F (36.5 C) (11/17 1420) Pulse Rate:  [65-89] 69 (11/17 1420) Resp:  [11-22] 17 (11/17 1420) BP: (83-134)/(43-79) 99/49 (11/17 1420) SpO2:  [92 %-100 %] 93 % (11/17 1420) Weight:  [95.4 kg] 95.4 kg (11/16 1541) Last BM Date: 07/15/19  Intake/Output from previous day: No intake/output data recorded. Intake/Output this shift: No intake/output data recorded.  DP pulse is trace on the left, not clearly palpable on the right.  PT pulse could not clearly be palpated bilateral.  Continued full-thickness necrotic wound on the left foot at the first ray amputation site with exposure of bone at the end of the first metatarsal.  Capsule is present through the base of the wound at the second metatarsal phalangeal joint.  Second toe is dusky and cool to the touch.  The medial wound measures approximately 6 cm x 2-1/2 cm.  Lab Results:  Recent Labs    07/17/19 1359  WBC 10.6*  HGB 9.6*  HCT 30.4*  PLT 361   BMET Recent Labs    07/17/19 1359 07/18/19 0857  NA 135  --   K 4.7 4.7  CL 98  --   CO2 22  --   GLUCOSE 128*  --   BUN 47*  --   CREATININE 7.24*  --   CALCIUM 8.1*  --    PT/INR Recent Labs    07/17/19 1359  LABPROT 14.7  INR 1.2   ABG No results for input(s): PHART, HCO3 in the last 72 hours.  Invalid input(s): PCO2, PO2  Studies/Results: Dg Foot 2 Views Left  Result Date: 07/17/2019 CLINICAL DATA:  Nonhealing wound of the left foot with perianal and drainage. History of partial amputation. EXAM: LEFT FOOT - 2 VIEW COMPARISON:  Radiographs 05/24/2019 and 05/22/2019. FINDINGS: Stable postsurgical changes following amputation through the base of the 1st metatarsal. There is new poor cortical definition of the 2nd metatarsal head. No progressive  destruction of the 1st metatarsal remnant identified. There is a stable prominent plantar calcaneal spur and prominent diffuse vascular calcifications. No unexpected foreign bodies. IMPRESSION: 1. New poor cortical definition of the 2nd metatarsal head suspicious for osteomyelitis. 2. Stable postsurgical changes following amputation through the base of the 1st metatarsal. Electronically Signed   By: Richardean Sale M.D.   On: 07/17/2019 14:38    Anti-infectives: Anti-infectives (From admission, onward)   Start     Dose/Rate Route Frequency Ordered Stop   07/18/19 1400  cefTRIAXone (ROCEPHIN) 2 g in sodium chloride 0.9 % 100 mL IVPB     2 g 200 mL/hr over 30 Minutes Intravenous Every 24 hours 07/18/19 0000     07/18/19 0845  ceFAZolin (ANCEF) IVPB 1 g/50 mL premix     1 g 100 mL/hr over 30 Minutes Intravenous  Once 07/18/19 0833 07/18/19 1044   07/18/19 0700  vancomycin (VANCOCIN) 1,250 mg in sodium chloride 0.9 % 250 mL IVPB     1,250 mg 166.7 mL/hr over 90 Minutes Intravenous  Once 07/18/19 0653     07/17/19 1300  vancomycin (VANCOCIN) IVPB 1000 mg/200 mL premix     1,000 mg 200 mL/hr over 60 Minutes Intravenous  Once 07/17/19 1257 07/18/19 0010   07/17/19 1300  cefTRIAXone (ROCEPHIN) 2 g in sodium chloride 0.9 % 100 mL  IVPB     2 g 200 mL/hr over 30 Minutes Intravenous  Once 07/17/19 1257 07/17/19 1648      Assessment/Plan: s/p Procedure(s): LOWER EXTREMITY ANGIOGRAPHY (Left) Assessment: Peripheral vascular disease with gangrenous changes left second toe and forefoot with nonhealing previous first ray amputation.   Plan: Saline wet-to-dry dressing reapplied to the wound on the left foot.  I did discuss this patient with Dr. [name] and due to the patient's small vessel disease in the forefoot he felt like a transmetatarsal amputation would have a high likelihood of not healing.  Discussed a more limited debridement of the left foot with second ray amputation and at this point it is  felt like this may be the better option for now.  Would like to try to bring him back in the near future for revascularization of his peroneal artery.  Discussed with the patient the above procedure which would include debriding some additional first metatarsal and the wound as well as second ray amputation.  Discussed risks and complications of the procedure including continued inability to heal due to his diabetes or peripheral vascular disease or infection.  Discussed that with his small vessel disease he is still at a high risk for below-knee amputation which would have a much better healing potential.  Questions invited and answered.  Patient elects to proceed with the above-mentioned procedures.  We will plan for this tomorrow evening after work.  N.p.o. after breakfast.  Consent form for debridement soft tissue and bone left foot with second ray amputation.  Plan for surgery tomorrow evening.  LOS: 1 day    Durward Fortes 07/18/2019

## 2019-07-18 NOTE — Progress Notes (Signed)
Pre [facility] Assessment    07/18/19 1940  Neurological  Level of Consciousness Alert  Orientation Level Oriented X4  Respiratory  Respiratory Pattern Regular;Unlabored  Chest Assessment Chest expansion symmetrical  Cough None  Cardiac  Pulse Regular  Heart Sounds S1, S2  ECG Monitor Yes  Cardiac Rhythm NSR;BBB  Vascular  R Radial Pulse +2  L Radial Pulse +2  Psychosocial  Psychosocial (WDL) WDL

## 2019-07-18 NOTE — Progress Notes (Signed)
PROGRESS NOTE    Scott [name]  VEL:[id number] DOB: Aug 02, 1961 DOA: 07/17/2019 PCP: [facility] [employer]   Brief Narrative:  58 year old white male with a past medical history of diabetes, peripheral arterial disease end-stage renal disease on peritoneal dialysis who was seen in the emergency room for right toe pain.  Patient was sent by his podiatrist secondary to 3-day history of pain without trauma with necrosis or for left foot first metatarsal osteomyelitis with gangrenous changes as well as right hallux gangrene with deep tissue ulceration.  He was sent to the ER for further evaluation with consultation with vascular surgery and podiatry consultation   Assessment & Plan:   Principal Problem:   PAD (peripheral artery disease) ([place]) Active Problems:   ESRD (end stage renal disease) ([place])   Type II diabetes mellitus ([place])   Coronary artery disease   Osteomyelitis of second toe of left foot (HCC)   Ulcerated, foot, right, with necrosis of muscle ([place])   Osteomyelitis of  left toe/right hallux with gangrene: -Angiogram today -Continue with antibiotics vancomycin Rocephin -Vascular surgery following -Podiatry following  Peripheral arterial disease: -vasc surg consult with angio today -further recs pending results, possible revasc?  End-stage renal disease on peritoneal dialysis -Contacted nephrology today make them aware and will see in consultation -Patient did not have his peritoneal dialysis last night -Electrolyte monitoring with daily labs  Type 2 diabetes: -Continue sliding scale insulin -ADA diet -Blood glucose AC at bedtime  CAD -Antiplatelets held in anticipation of procedure -Restart pending results of test and possible surgical intervention -Continue Coreg Imdur Lasix and hydralazine  DVT prophylaxis: Heparin SQ  Code Status: FULL    Code Status Orders  (From admission, onward)         Start     Ordered   07/17/19 1356  Full code   Continuous     07/17/19 1358        Code Status History    Date Active Date Inactive Code Status Order ID Comments User Context   05/22/2019 1830 05/26/2019 1657 Full Code [id number]  Demetrios Loll, MD Inpatient   09/18/2018 0022 09/19/2018 1608 Full Code [id number]  Mayo, Pete Pelt, MD ED   03/09/2018 1206 03/12/2018 1712 Full Code [id number]  Henreitta Leber, MD Inpatient   01/17/2017 0338 01/20/2017 1834 Full Code [id number]  Harrie Foreman, MD ED   11/13/2016 1013 11/20/2016 1256 Full Code [id number]  Hillary Bow, MD ED   01/09/2015 1019 01/10/2015 1518 Full Code [id number]  Max Sane, MD Inpatient   12/18/2014 1620 12/19/2014 1627 Full Code [id number]  Charolette Forward, MD Inpatient   12/14/2014 1600 12/18/2014 1620 Full Code [id number]  Charolette Forward, MD Inpatient   12/13/2014 1324 12/14/2014 1600 Full Code [id number]  Charolette Forward, MD Inpatient   Advance Care Planning Activity     Family Communication: CALLED SON LM Disposition Plan:   Patient remained inpatient for IV antibiotics, further subspecialty testing with angiogram, vascular surgery and possible surgical intervention with podiatry.  Patient not ready for discharge Consults called: None Admission status: Inpatient   Consultants:   Vascular surgery, podiatry  Procedures:  Nm Myocar Multi W/spect W/wall Motion / Ef  Result Date: 07/12/2019  There was no ST segment deviation noted during stress.  There is a medium defect of severe severity present in the apical anterior, apical septal, apical inferior and apex location.  Findings consistent with prior myocardial infarction.  This is an intermediate risk study.  The left ventricular ejection  fraction is severely decreased (<30%).    Dg Foot 2 Views Left  Result Date: 07/17/2019 CLINICAL DATA:  Nonhealing wound of the left foot with perianal and drainage. History of partial amputation. EXAM: LEFT FOOT - 2 VIEW COMPARISON:  Radiographs 05/24/2019 and 05/22/2019. FINDINGS: Stable  postsurgical changes following amputation through the base of the 1st metatarsal. There is new poor cortical definition of the 2nd metatarsal head. No progressive destruction of the 1st metatarsal remnant identified. There is a stable prominent plantar calcaneal spur and prominent diffuse vascular calcifications. No unexpected foreign bodies. IMPRESSION: 1. New poor cortical definition of the 2nd metatarsal head suspicious for osteomyelitis. 2. Stable postsurgical changes following amputation through the base of the 1st metatarsal. Electronically Signed   By: Richardean Sale M.D.   On: 07/17/2019 14:38   Vas [place] Lower Extremity Arterial Duplex  Result Date: 07/03/2019 LOWER EXTREMITY ARTERIAL DUPLEX STUDY High Risk Factors: Hypertension.  Vascular Interventions: 05/23/2019 PTA of Lt ATA and dorsalis pedis artery. PTA. Current ABI:            Not obtained Comparison Study: 06/08/2019 Performing Technologist: Almira Coaster RVS  Examination Guidelines: A complete evaluation includes B-mode imaging, spectral Doppler, color Doppler, and power Doppler as needed of all accessible portions of each vessel. Bilateral testing is considered an integral part of a complete examination. Limited examinations for reoccurring indications may be performed as noted.  +-----------+--------+-----+--------+----------+--------+ RIGHT      PSV cm/sRatioStenosisWaveform  Comments +-----------+--------+-----+--------+----------+--------+ CFA Distal 102                  monophasic         +-----------+--------+-----+--------+----------+--------+ DFA        123                  monophasic         +-----------+--------+-----+--------+----------+--------+ SFA Prox   84                   monophasic         +-----------+--------+-----+--------+----------+--------+ SFA Mid    61                   monophasic         +-----------+--------+-----+--------+----------+--------+ SFA Distal 109                   monophasic         +-----------+--------+-----+--------+----------+--------+ POP Distal 58                   monophasic         +-----------+--------+-----+--------+----------+--------+ ATA Distal 0                    Occluded           +-----------+--------+-----+--------+----------+--------+ PTA Distal 30                   monophasic         +-----------+--------+-----+--------+----------+--------+ PERO Distal28                   monophasic         +-----------+--------+-----+--------+----------+--------+  +-----------+--------+-----+--------+----------+--------+ LEFT       PSV cm/sRatioStenosisWaveform  Comments +-----------+--------+-----+--------+----------+--------+ CFA Distal 95                   monophasic         +-----------+--------+-----+--------+----------+--------+ DFA        28  monophasic         +-----------+--------+-----+--------+----------+--------+ SFA Prox   96                   monophasic         +-----------+--------+-----+--------+----------+--------+ SFA Mid    117                  monophasic         +-----------+--------+-----+--------+----------+--------+ SFA Distal 70                   monophasic         +-----------+--------+-----+--------+----------+--------+ POP Distal 60                   monophasic         +-----------+--------+-----+--------+----------+--------+ ATA Distal 105                  monophasic         +-----------+--------+-----+--------+----------+--------+ PTA Distal 0                    Occluded           +-----------+--------+-----+--------+----------+--------+ PERO Distal30                   monophasic         +-----------+--------+-----+--------+----------+--------+  Summary: Right: Imaging and Waveforms obtained throughout in the Right Lower Extremity. Monophasic flow seen Predominantly in the Right Lower Extremity; No flow seen in the Right ATA.  Left: Imaging and Waveforms obtained throughout in the Left Lower Extremity. Monophasic flow seen Predominantly in the Left Lower Extremity; No flow seen in the Left PTA. Blood Pressure cannot be obtained in the Left>250.   See table(s) above for measurements and observations. Electronically signed by Hortencia Pilar MD on 07/03/2019 at 5:29:03 PM.    Final      Antimicrobials:   Ceftriaxone and vancomycin day 2   Subjective: Admitted overnight with right foot and left foot infection. Hemodynamically stable, no acute events  Objective: Vitals:   07/18/19 1315 07/18/19 1320 07/18/19 1327 07/18/19 1344  BP: (!) 83/74 107/61  109/74  Pulse: 65 67 66 67  Resp: 18 18 18    Temp:    (!) 97.4 F (36.3 C)  TempSrc:    Oral  SpO2: 97% 98% 97% 97%  Weight:      Height:        Intake/Output Summary (Last 24 hours) at 07/18/2019 1351 Last data filed at 07/17/2019 2152 Gross per 24 hour  Intake 0 ml  Output -  Net 0 ml   Filed Weights   07/17/19 1219 07/17/19 1541  Weight: 95.7 kg 95.4 kg    Examination:  General exam: Appears calm and comfortable  Respiratory system: Clear to auscultation. Respiratory effort normal. Cardiovascular system: S1 & S2 heard, RRR. No JVD, murmurs, rubs, gallops or clicks. No pedal edema. Gastrointestinal system: Abdomen is nondistended, soft and nontender. No organomegaly or masses felt. Normal bowel sounds heard. Central nervous system: Alert and oriented. No focal neurological deficits. Extremities: Warm well perfused, infection is noted below Skin: Patient with gangrenous infection of bilateral  feet, malodorous unstageable Psychiatry: Judgement and insight appear normal. Mood & affect appropriate.     Data Reviewed: I have personally reviewed following labs and imaging studies  CBC: Recent Labs  Lab 07/17/19 1359  WBC 10.6*  NEUTROABS 8.5*  HGB 9.6*  HCT 30.4*  MCV 94.7  PLT 361  Basic Metabolic Panel: Recent Labs  Lab  07/17/19 1359 07/18/19 0857  NA 135  --   K 4.7 4.7  CL 98  --   CO2 22  --   GLUCOSE 128*  --   BUN 47*  --   CREATININE 7.24*  --   CALCIUM 8.1*  --    GFR: Estimated Creatinine Clearance: 12.4 mL/min (A) (by C-G formula based on SCr of 7.24 mg/dL (H)). Liver Function Tests: No results for input(s): AST, ALT, ALKPHOS, BILITOT, PROT, ALBUMIN in the last 168 hours. No results for input(s): LIPASE, AMYLASE in the last 168 hours. No results for input(s): AMMONIA in the last 168 hours. Coagulation Profile: Recent Labs  Lab 07/17/19 1359  INR 1.2   Cardiac Enzymes: Recent Labs  Lab 07/18/19 0522  CKTOTAL 88   BNP (last 3 results) No results for input(s): PROBNP in the last 8760 hours. HbA1C: Recent Labs    07/17/19 1359  HGBA1C 6.0*   CBG: Recent Labs  Lab 07/18/19 0838 07/18/19 1256  GLUCAP 95 96   Lipid Profile: No results for input(s): CHOL, HDL, LDLCALC, TRIG, CHOLHDL, LDLDIRECT in the last 72 hours. Thyroid Function Tests: No results for input(s): TSH, T4TOTAL, FREET4, T3FREE, THYROIDAB in the last 72 hours. Anemia Panel: No results for input(s): VITAMINB12, FOLATE, FERRITIN, TIBC, IRON, RETICCTPCT in the last 72 hours. Sepsis Labs: Recent Labs  Lab 07/17/19 1530 07/17/19 1823  LATICACIDVEN 1.3 1.4    Recent Results (from the past 240 hour(s))  SARS CORONAVIRUS 2 (TAT 6-24 HRS) Nasopharyngeal Nasopharyngeal Swab     Status: None   Collection Time: 07/14/19  1:11 PM   Specimen: Nasopharyngeal Swab  Result Value Ref Range Status   SARS Coronavirus 2 NEGATIVE NEGATIVE Final    Comment: (NOTE) SARS-CoV-2 target nucleic acids are NOT DETECTED. The SARS-CoV-2 RNA is generally detectable in upper and lower respiratory specimens during the acute phase of infection. Negative results do not preclude SARS-CoV-2 infection, do not rule out co-infections with other pathogens, and should not be used as the sole basis for treatment or other patient management  decisions. Negative results must be combined with clinical observations, patient history, and epidemiological information. The expected result is Negative. Fact Sheet for Patients: SugarRoll.be Fact Sheet for Healthcare Providers: https://www.woods-mathews.com/ This test is not yet approved or cleared by the [place] FDA and  has been authorized for detection and/or diagnosis of SARS-CoV-2 by FDA under an Emergency Use Authorization (EUA). This EUA will remain  in effect (meaning this test can be used) for the duration of the COVID-19 declaration under Section 56 4(b)(1) of the Act, 21 U.S.C. section 360bbb-3(b)(1), unless the authorization is terminated or revoked sooner. Performed at [facility], [place] [address] [place], [place] [address]   Surgical PCR screen     Status: None   Collection Time: 07/18/19  6:35 AM   Specimen: Nasal Mucosa; Nasal Swab  Result Value Ref Range Status   MRSA, PCR NEGATIVE NEGATIVE Final   Staphylococcus aureus NEGATIVE NEGATIVE Final    Comment: (NOTE) The Xpert SA Assay (FDA approved for NASAL specimens in patients 74 years of age and older), is one component of a comprehensive surveillance program. It is not intended to diagnose infection nor to guide or monitor treatment. Performed at [facility], [address] [place], [place] [address]          Radiology Studies: Dg Foot 2 Views Left  Result Date: 07/17/2019 CLINICAL DATA:  Nonhealing wound of the left foot with perianal and drainage. History of partial amputation. EXAM: LEFT FOOT - 2 VIEW COMPARISON:  Radiographs 05/24/2019 and 05/22/2019. FINDINGS: Stable postsurgical changes following amputation through the base of the 1st metatarsal. There is new poor cortical definition of the 2nd metatarsal head. No progressive destruction of the 1st metatarsal remnant identified. There is a stable prominent plantar calcaneal spur and  prominent diffuse vascular calcifications. No unexpected foreign bodies. IMPRESSION: 1. New poor cortical definition of the 2nd metatarsal head suspicious for osteomyelitis. 2. Stable postsurgical changes following amputation through the base of the 1st metatarsal. Electronically Signed   By: Richardean Sale M.D.   On: 07/17/2019 14:38        Scheduled Meds: . atorvastatin  40 mg Oral Daily  . calcitRIOL  0.25 mcg Oral Daily  . calcium acetate  2,001 mg Oral TID WC  . carvedilol  25 mg Oral BID WC  . cholecalciferol  2,000 Units Oral Daily  . furosemide  80 mg Oral Daily  . heparin  5,000 Units Subcutaneous Q8H  . hydrALAZINE  25 mg Oral TID  . insulin aspart  0-9 Units Subcutaneous Q6H  . isosorbide mononitrate  30 mg Oral Daily  . multivitamin  1 tablet Oral QHS  . mupirocin ointment  1 application Nasal BID  . ondansetron      . sodium chloride flush  3 mL Intravenous Q12H  . sodium chloride flush  3 mL Intravenous Q12H  . tamsulosin  0.4 mg Oral Daily   Continuous Infusions: . sodium chloride    . sodium chloride    . cefTRIAXone (ROCEPHIN)  IV    . vancomycin       LOS: 1 day    Time spent: [place], MD [facility]  If 7PM-7AM, please contact night-coverage  07/18/2019, 1:51 PM

## 2019-07-18 NOTE — Progress Notes (Signed)
CCPD [place] started w/o complication    [birth year] 1949  Peritoneal Catheter Left lower abdomen Continuous ambulatory  Placement Date/Time: 04/07/17 1328   Procedural Verification: Medical records & consent reviewed  Time out: Correct Patient;Correct Site;Correct Procedure;Special equipment/requirements available  Person Inserting Catheter: Dr. [name]  Catheter Locat...  Site Assessment Clean;Dry;Intact  Drainage Description Black  Catheter status Capped  Dressing Gauze/Drain sponge  Dressing Status Clean;Dry;Intact  Cycler Setup  Total Number of Exchanges 4  Fill Volume 200  Dianeal Solution Dextrose 2.5% in 6000 mL  Last Fill Volume 0  Fill Time - Minute(s) 10  Dwell Time - Hour(s) 1  Dwell Time - Minute(s) 45  Drain Time - Minute(s) 20 mins  Completion  Exit Site Care Performed Yes  Cell Count on Daytime Exchange N/A  Treatment Status Started  Education / Care Plan  Dialysis Education Provided Yes  Outpatient Plan of Care Reviewed and on Chart Yes  Hand-Off documentation  Report given to (Full Name) Beatris Ship, RN   Report received from (Full Name) Ruthy Dick, RN

## 2019-07-18 NOTE — H&P (Signed)
[facility] History & Physical Update  The patient was interviewed and re-examined.  The patient's previous History and Physical has been reviewed and is unchanged.  There is no change in the plan of care. We plan to proceed with the scheduled procedure.  Hortencia Pilar, MD  07/18/2019, 10:26 AM

## 2019-07-18 NOTE — Consult Note (Signed)
ANTICOAGULATION CONSULT NOTE  Pharmacy Consult for heparin drip Indication: VTE prophylaxis  Patient Measurements: Height: 5\' 9"  (175.3 cm) Weight: 210 lb 5.1 oz (95.4 kg) IBW/kg (Calculated) : 70.7 Heparin Dosing Weight: 90.5 kg  Vital Signs: Temp: 97.4 F (36.3 C) (11/17 1344) Temp Source: Oral (11/17 1344) BP: 109/74 (11/17 1344) Pulse Rate: 67 (11/17 1344)  Labs: Recent Labs    07/17/19 1359 07/18/19 0522  HGB 9.6*  --   HCT 30.4*  --   PLT 361  --   LABPROT 14.7  --   INR 1.2  --   CREATININE 7.24*  --   CKTOTAL  --  88    Estimated Creatinine Clearance: 12.4 mL/min (A) (by C-G formula based on SCr of 7.24 mg/dL (H)).   Medical History: Past Medical History:  Diagnosis Date  . Acute respiratory failure with hypoxia ([place]) 09/17/2018  . Arthritis    "left arm; right leg" (12/13/2014)  . Asthma   . CHF (congestive heart failure) ([place])   . Chronic disease anemia    [name] 12/13/2014  . Chronic kidney disease (CKD), stage IV (severe) ([place])    [name] 12/13/2014... on dialysis  . Complication of anesthesia    unable to urinate after CAPD urgery  . Continuous ambulatory peritoneal dialysis status ([place])   . Coronary artery disease    [name] 12/13/2014  . Depression   . Dysrhythmia    patient unaware of irregular heartbeat  . GERD (gastroesophageal reflux disease)   . High cholesterol    [name] 12/13/2014  . Hypertension   . Non-Q wave myocardial infarction ([place])    [name] 12/13/2014  . PVD (peripheral vascular disease) ([place])    [name] 12/13/2014  . Type II diabetes mellitus (HCC)     Medications:  Scheduled:  . atorvastatin  40 mg Oral Daily  . calcitRIOL  0.25 mcg Oral Daily  . calcium acetate  2,001 mg Oral TID WC  . carvedilol  25 mg Oral BID WC  . cholecalciferol  2,000 Units Oral Daily  . furosemide  80 mg Oral Daily  . hydrALAZINE  25 mg Oral TID  . insulin aspart  0-9 Units Subcutaneous Q6H  . isosorbide mononitrate  30 mg Oral Daily  .  multivitamin  1 tablet Oral QHS  . mupirocin ointment  1 application Nasal BID  . ondansetron      . sodium chloride flush  3 mL Intravenous Q12H  . sodium chloride flush  3 mL Intravenous Q12H  . tamsulosin  0.4 mg Oral Daily    Assessment: 58 y.o. male with medical history significant of  Rt toe pain with extensive and severe PAD history. Today Dr [name] performed an angiogram and is recommending heparin with 1/2 of the nomogram initial dose then per nomogram. He is on no chronic anticoagulation PTA. Baseline labs:  INR 1.2, Hgb 9.6, PLT wnl  Goal of Therapy:   Heparin level 0.3-0.7 units/ml Monitor platelets by anticoagulation protocol: Yes   Plan:  Give 2000 units bolus x 1 Start heparin infusion at 1000 units/hr Check anti-Xa level in 8 hours and daily while on heparin Continue to monitor H&H and platelets  Dallie Piles, PharmD 07/18/2019,1:58 PM

## 2019-07-18 NOTE — Progress Notes (Signed)
[facility], [place] 07/18/19  Subjective:   LOS: 1 No intake/output data recorded. Patient known to our practice from outpatient dialysis.  This time he is admitted for necrotic wound on the left foot with exposure of the bone.  Diagnosed with osteomyelitis.  Angiogram shows peripheral vascular disease with gangrenous changes to the left second toe and forefoot with nonhealing previous first ray amputation. Nephrology consult requested for continuation of peritoneal dialysis Patient denies any nausea or vomiting.  No shortness of breath   Objective:  Vital signs in last 24 hours:  Temp:  [97.4 F (36.3 C)-98.3 F (36.8 C)] 97.7 F (36.5 C) (11/17 1420) Pulse Rate:  [65-89] 69 (11/17 1420) Resp:  [11-22] 17 (11/17 1420) BP: (83-134)/(43-79) 99/49 (11/17 1420) SpO2:  [92 %-100 %] 93 % (11/17 1420) Weight:  [95.4 kg] 95.4 kg (11/16 1541)  Weight change:  Filed Weights   07/17/19 1219 07/17/19 1541  Weight: 95.7 kg 95.4 kg    Intake/Output:    Intake/Output Summary (Last 24 hours) at 07/18/2019 1440 Last data filed at 07/17/2019 2152 Gross per 24 hour  Intake 0 ml  Output -  Net 0 ml     Physical Exam: General:  No acute distress, sitting up in the bed  HEENT  moist oral mucous membranes  Pulm/lungs  normal breathing effort, clear to auscultation  CVS/Heart  no rub or gallop  Abdomen:   Soft, nontender  Extremities:  Left foot is bandaged, trace edema  Neurologic:  Alert, oriented  Skin:  Congestive changes on the legs  Access:  Right arm AV fistula, PD catheter in place       Basic Metabolic Panel:  Recent Labs  Lab 07/17/19 1359 07/18/19 0857  NA 135  --   K 4.7 4.7  CL 98  --   CO2 22  --   GLUCOSE 128*  --   BUN 47*  --   CREATININE 7.24*  --   CALCIUM 8.1*  --      CBC: Recent Labs  Lab 07/17/19 1359  WBC 10.6*  NEUTROABS 8.5*  HGB 9.6*  HCT 30.4*  MCV 94.7  PLT 361      Lab Results  Component Value  Date   HEPBSAG Negative 01/19/2017   HEPBSAB Non Reactive 01/18/2017   HEPBIGM Negative 01/18/2017      Microbiology:  Recent Results (from the past 240 hour(s))  SARS CORONAVIRUS 2 (TAT 6-24 HRS) Nasopharyngeal Nasopharyngeal Swab     Status: None   Collection Time: 07/14/19  1:11 PM   Specimen: Nasopharyngeal Swab  Result Value Ref Range Status   SARS Coronavirus 2 NEGATIVE NEGATIVE Final    Comment: (NOTE) SARS-CoV-2 target nucleic acids are NOT DETECTED. The SARS-CoV-2 RNA is generally detectable in upper and lower respiratory specimens during the acute phase of infection. Negative results do not preclude SARS-CoV-2 infection, do not rule out co-infections with other pathogens, and should not be used as the sole basis for treatment or other patient management decisions. Negative results must be combined with clinical observations, patient history, and epidemiological information. The expected result is Negative. Fact Sheet for Patients: SugarRoll.be Fact Sheet for Healthcare Providers: https://www.woods-mathews.com/ This test is not yet approved or cleared by the [place] FDA and  has been authorized for detection and/or diagnosis of SARS-CoV-2 by FDA under an Emergency Use Authorization (EUA). This EUA will remain  in effect (meaning this test can be used) for the duration of the COVID-19  declaration under Section 56 4(b)(1) of the Act, 21 U.S.C. section 360bbb-3(b)(1), unless the authorization is terminated or revoked sooner. Performed at [facility], [place] [address], [place], [place] [address]   Surgical PCR screen     Status: None   Collection Time: 07/18/19  6:35 AM   Specimen: Nasal Mucosa; Nasal Swab  Result Value Ref Range Status   MRSA, PCR NEGATIVE NEGATIVE Final   Staphylococcus aureus NEGATIVE NEGATIVE Final    Comment: (NOTE) The Xpert SA Assay (FDA approved for NASAL specimens in patients 16 years of  age and older), is one component of a comprehensive surveillance program. It is not intended to diagnose infection nor to guide or monitor treatment. Performed at [facility], [address] [place], [place] [address]     Coagulation Studies: Recent Labs    07/17/19 1359  LABPROT 14.7  INR 1.2    Urinalysis: No results for input(s): COLORURINE, LABSPEC, PHURINE, GLUCOSEU, HGBUR, BILIRUBINUR, KETONESUR, PROTEINUR, UROBILINOGEN, NITRITE, LEUKOCYTESUR in the last 72 hours.  Invalid input(s): APPERANCEUR    Imaging: Dg Foot 2 Views Left  Result Date: 07/17/2019 CLINICAL DATA:  Nonhealing wound of the left foot with perianal and drainage. History of partial amputation. EXAM: LEFT FOOT - 2 VIEW COMPARISON:  Radiographs 05/24/2019 and 05/22/2019. FINDINGS: Stable postsurgical changes following amputation through the base of the 1st metatarsal. There is new poor cortical definition of the 2nd metatarsal head. No progressive destruction of the 1st metatarsal remnant identified. There is a stable prominent plantar calcaneal spur and prominent diffuse vascular calcifications. No unexpected foreign bodies. IMPRESSION: 1. New poor cortical definition of the 2nd metatarsal head suspicious for osteomyelitis. 2. Stable postsurgical changes following amputation through the base of the 1st metatarsal. Electronically Signed   By: Richardean Sale M.D.   On: 07/17/2019 14:38     Medications:   . sodium chloride    . sodium chloride    . cefTRIAXone (ROCEPHIN)  IV    . heparin    . vancomycin     . atorvastatin  40 mg Oral Daily  . calcitRIOL  0.25 mcg Oral Daily  . calcium acetate  2,001 mg Oral TID WC  . carvedilol  25 mg Oral BID WC  . cholecalciferol  2,000 Units Oral Daily  . furosemide  80 mg Oral Daily  . heparin  2,000 Units Intravenous Once  . hydrALAZINE  25 mg Oral TID  . insulin aspart  0-9 Units Subcutaneous Q6H  . isosorbide mononitrate  30 mg Oral Daily  .  multivitamin  1 tablet Oral QHS  . mupirocin ointment  1 application Nasal BID  . ondansetron      . sodium chloride flush  3 mL Intravenous Q12H  . sodium chloride flush  3 mL Intravenous Q12H  . tamsulosin  0.4 mg Oral Daily   sodium chloride, sodium chloride, albuterol, calcium acetate, HYDROcodone-acetaminophen, HYDROmorphone (DILAUDID) injection, morphine injection, morphine injection, nitroGLYCERIN, ondansetron (ZOFRAN) IV, oxyCODONE, sodium chloride flush, sodium chloride flush  Assessment/ Plan:  58 y.o. male with  end stage renal disease on peritoneal dialysis, congestive heart failure, GERD, hypertension, peripheral vascular disease, diabetes mellitus type 2, diabetic retinopathy, diabetic gastroparesis, admitted with gangrene of left first toe.  [facility]/[address] CAPD 1822mL fills 4 exchanges  Principal Problem:   PAD (peripheral artery disease) (HCC) Active Problems:   ESRD (end stage renal disease) ([place])   Type II diabetes mellitus ([place])   Coronary artery disease   Osteomyelitis  of second toe of left foot (HCC)   Ulcerated, foot, right, with necrosis of muscle ([place])   #. ESRD. On CAPD  Recent Labs    07/17/19 1359  CREATININE 7.24*  will arrange CCPD while in the hospital  #. Anemia of CKD  Lab Results  Component Value Date   HGB 9.6 (L) 07/17/2019  will monitor for need of EPO  #.  Secondary hyperparathyroidism    Component Value Date/Time   PTH 164 (H) 09/18/2018 1439   Lab Results  Component Value Date   PHOS 8.7 (H) 05/25/2019  continue home dose of PhosLo   #. Diabetes type 2 with CKD Hemoglobin A1C (no units)  Date Value  09/17/2015 9.1   Hgb A1c MFr Bld (%)  Date Value  07/17/2019 6.0 (H)   # Osteomyelitis of left foot with necrosis and osteomyelitis of first metatarsal, gangrenous changes with deep tissue ulceration - Plan as per podiatrist and IM team -   #Right hydroureteronephrosis -Following with urologist Dr.  Erlene Quan -cystoscopy is planned   LOS: [place] 11/17/20202:40 PM  [place], [place]

## 2019-07-18 NOTE — Consult Note (Signed)
WOC consulted for foot wounds, seen by his podiatrist yesterday. Updated orders based on Dr. Deborra Medina notes. Will be followed by vascular and podiatry inpatient.    Re consult if needed, will not follow at this time. Thanks  Jaisean Monteforte [employer], RN,CWOCN, CNS, [place] [phone])

## 2019-07-18 NOTE — Progress Notes (Signed)
Per CCMD patient had a few second run of Ventricular trigeminy. Patient just returned from Angiogram. Patient is A/Ox4 and asymptomatic. MD made aware.   Fuller Mandril, RN

## 2019-07-19 ENCOUNTER — Encounter
Admission: EM | Disposition: A | Discharge status: Home Health | Payer: Self-pay | Source: Ambulatory Visit | Attending: Internal Medicine

## 2019-07-19 ENCOUNTER — Encounter: Payer: Self-pay | Admitting: Vascular Surgery

## 2019-07-19 ENCOUNTER — Inpatient Hospital Stay: Payer: Medicare Other | Admitting: Anesthesiology

## 2019-07-19 ENCOUNTER — Ambulatory Visit: Payer: Commercial Managed Care - HMO | Admitting: Internal Medicine

## 2019-07-19 DIAGNOSIS — E1129 Type 2 diabetes mellitus with other diabetic kidney complication: Secondary | ICD-10-CM | POA: Diagnosis not present

## 2019-07-19 DIAGNOSIS — M869 Osteomyelitis, unspecified: Secondary | ICD-10-CM | POA: Diagnosis not present

## 2019-07-19 DIAGNOSIS — N186 End stage renal disease: Secondary | ICD-10-CM | POA: Diagnosis not present

## 2019-07-19 DIAGNOSIS — I739 Peripheral vascular disease, unspecified: Secondary | ICD-10-CM | POA: Diagnosis not present

## 2019-07-19 HISTORY — PX: IRRIGATION AND DEBRIDEMENT FOOT: SHX6602

## 2019-07-19 HISTORY — PX: AMPUTATION TOE: SHX6595

## 2019-07-19 LAB — CBC
HCT: 24.4 % — ABNORMAL LOW (ref 39.0–52.0)
Hemoglobin: 8 g/dL — ABNORMAL LOW (ref 13.0–17.0)
MCH: 30.2 pg (ref 26.0–34.0)
MCHC: 32.8 g/dL (ref 30.0–36.0)
MCV: 92.1 fL (ref 80.0–100.0)
Platelets: 285 10*3/uL (ref 150–400)
RBC: 2.65 MIL/uL — ABNORMAL LOW (ref 4.22–5.81)
RDW: 14 % (ref 11.5–15.5)
WBC: 10.8 10*3/uL — ABNORMAL HIGH (ref 4.0–10.5)
nRBC: 0 % (ref 0.0–0.2)

## 2019-07-19 LAB — BASIC METABOLIC PANEL
Anion gap: 19 — ABNORMAL HIGH (ref 5–15)
BUN: 49 mg/dL — ABNORMAL HIGH (ref 6–20)
CO2: 23 mmol/L (ref 22–32)
Calcium: 7.3 mg/dL — ABNORMAL LOW (ref 8.9–10.3)
Chloride: 93 mmol/L — ABNORMAL LOW (ref 98–111)
Creatinine, Ser: 7.1 mg/dL — ABNORMAL HIGH (ref 0.61–1.24)
GFR calc Af Amer: 9 mL/min — ABNORMAL LOW (ref >60)
GFR calc non Af Amer: 8 mL/min — ABNORMAL LOW (ref >60)
Glucose, Bld: 116 mg/dL — ABNORMAL HIGH (ref 70–99)
Potassium: 4.5 mmol/L (ref 3.5–5.1)
Sodium: 135 mmol/L (ref 135–145)

## 2019-07-19 LAB — GLUCOSE, CAPILLARY
Glucose-Capillary: 110 mg/dL — ABNORMAL HIGH (ref 70–99)
Glucose-Capillary: 148 mg/dL — ABNORMAL HIGH (ref 70–99)
Glucose-Capillary: 155 mg/dL — ABNORMAL HIGH (ref 70–99)
Glucose-Capillary: 192 mg/dL — ABNORMAL HIGH (ref 70–99)
Glucose-Capillary: 87 mg/dL (ref 70–99)
Glucose-Capillary: 99 mg/dL (ref 70–99)

## 2019-07-19 LAB — HEPARIN LEVEL (UNFRACTIONATED)
Heparin Unfractionated: 0.29 IU/mL — ABNORMAL LOW (ref 0.30–0.70)
Heparin Unfractionated: <0.1 IU/mL — ABNORMAL LOW (ref 0.30–0.70)

## 2019-07-19 LAB — VANCOMYCIN, RANDOM: Vancomycin Rm: 23

## 2019-07-19 SURGERY — AMPUTATION, TOE
Anesthesia: General | Site: Toe | Laterality: Left

## 2019-07-19 MED ORDER — ONDANSETRON HCL 4 MG/2ML IJ SOLN
INTRAMUSCULAR | Status: DC | PRN
  Administered 2019-07-19: 4 mg via INTRAVENOUS

## 2019-07-19 MED ORDER — BUPIVACAINE HCL (PF) 0.5 % IJ SOLN
INTRAMUSCULAR | Status: DC | PRN
  Administered 2019-07-19: 10 mL

## 2019-07-19 MED ORDER — HEPARIN (PORCINE) 25000 UT/250ML-% IV SOLN
1750.0000 [IU]/h | INTRAVENOUS | Status: DC
  Administered 2019-07-19: 1350 [IU]/h via INTRAVENOUS
  Administered 2019-07-20: 1500 [IU]/h via INTRAVENOUS
  Filled 2019-07-19 (×2): qty 250

## 2019-07-19 MED ORDER — VANCOMYCIN HCL 1000 MG IV SOLR
INTRAVENOUS | Status: AC
  Filled 2019-07-19: qty 1000

## 2019-07-19 MED ORDER — NEOMYCIN-POLYMYXIN B GU 40-200000 IR SOLN
Status: AC
  Filled 2019-07-19: qty 2

## 2019-07-19 MED ORDER — ALUM & MAG HYDROXIDE-SIMETH 200-200-20 MG/5ML PO SUSP
30.0000 mL | ORAL | Status: DC | PRN
  Administered 2019-07-19 – 2019-07-21 (×3): 30 mL via ORAL
  Filled 2019-07-19 (×3): qty 30

## 2019-07-19 MED ORDER — HEPARIN BOLUS VIA INFUSION
2700.0000 [IU] | Freq: Once | INTRAVENOUS | Status: AC
  Administered 2019-07-19: 2700 [IU] via INTRAVENOUS
  Filled 2019-07-19: qty 2700

## 2019-07-19 MED ORDER — PROPOFOL 10 MG/ML IV BOLUS
INTRAVENOUS | Status: AC
  Filled 2019-07-19: qty 20

## 2019-07-19 MED ORDER — LIDOCAINE HCL (CARDIAC) PF 100 MG/5ML IV SOSY
PREFILLED_SYRINGE | INTRAVENOUS | Status: DC | PRN
  Administered 2019-07-19: 100 mg via INTRAVENOUS

## 2019-07-19 MED ORDER — DEXAMETHASONE SODIUM PHOSPHATE 10 MG/ML IJ SOLN
INTRAMUSCULAR | Status: DC | PRN
  Administered 2019-07-19: 5 mg via INTRAVENOUS

## 2019-07-19 MED ORDER — CEFAZOLIN SODIUM-DEXTROSE 1-4 GM/50ML-% IV SOLN
INTRAVENOUS | Status: AC
  Filled 2019-07-19: qty 50

## 2019-07-19 MED ORDER — FENTANYL CITRATE (PF) 100 MCG/2ML IJ SOLN
INTRAMUSCULAR | Status: AC
  Filled 2019-07-19: qty 2

## 2019-07-19 MED ORDER — FENTANYL CITRATE (PF) 100 MCG/2ML IJ SOLN: 25.0000 ug | INTRAMUSCULAR | Status: DC | PRN

## 2019-07-19 MED ORDER — BUPIVACAINE HCL (PF) 0.5 % IJ SOLN
INTRAMUSCULAR | Status: AC
  Filled 2019-07-19: qty 30

## 2019-07-19 MED ORDER — PROPOFOL 10 MG/ML IV BOLUS
INTRAVENOUS | Status: DC | PRN
  Administered 2019-07-19: 150 mg via INTRAVENOUS

## 2019-07-19 MED ORDER — FENTANYL CITRATE (PF) 100 MCG/2ML IJ SOLN
INTRAMUSCULAR | Status: DC | PRN
  Administered 2019-07-19 (×2): 25 ug via INTRAVENOUS
  Administered 2019-07-19: 50 ug via INTRAVENOUS

## 2019-07-19 MED ORDER — PHENYLEPHRINE HCL (PRESSORS) 10 MG/ML IV SOLN
INTRAVENOUS | Status: DC | PRN
  Administered 2019-07-19: 50 ug via INTRAVENOUS
  Administered 2019-07-19 (×2): 100 ug via INTRAVENOUS
  Administered 2019-07-19: 50 ug via INTRAVENOUS
  Administered 2019-07-19 (×2): 100 ug via INTRAVENOUS

## 2019-07-19 MED ORDER — NEOMYCIN-POLYMYXIN B GU 40-200000 IR SOLN
Status: DC | PRN
  Administered 2019-07-19: 2 mL

## 2019-07-19 MED ORDER — ONDANSETRON HCL 4 MG/2ML IJ SOLN: 4.0000 mg | Freq: Once | INTRAMUSCULAR | Status: DC | PRN

## 2019-07-19 SURGICAL SUPPLY — 66 items
"PENCIL ELECTRO HAND CTR " (MISCELLANEOUS) ×2 IMPLANT
BLADE MED AGGRESSIVE (BLADE) ×2 IMPLANT
BLADE OSC/SAGITTAL MD 5.5X18 (BLADE) ×4 IMPLANT
BLADE OSC/SAGITTAL MD 9X18.5 (BLADE) ×2 IMPLANT
BLADE OSCILLATING/SAGITTAL (BLADE)
BLADE SURG 15 STRL LF DISP TIS (BLADE) ×4 IMPLANT
BLADE SURG 15 STRL SS (BLADE) ×4
BLADE SURG MINI STRL (BLADE) ×2 IMPLANT
BLADE SW THK.38XMED LNG THN (BLADE) IMPLANT
BNDG CONFORM 2 STRL LF (GAUZE/BANDAGES/DRESSINGS) ×2 IMPLANT
BNDG ELASTIC 4X5.8 VLCR NS LF (GAUZE/BANDAGES/DRESSINGS) ×4 IMPLANT
BNDG ELASTIC 4X5.8 VLCR STR LF (GAUZE/BANDAGES/DRESSINGS) ×4 IMPLANT
BNDG ESMARK 4X12 TAN STRL LF (GAUZE/BANDAGES/DRESSINGS) ×2 IMPLANT
BNDG GAUZE 4.5X4.1 6PLY STRL (MISCELLANEOUS) ×4 IMPLANT
CANISTER SUCT 1200ML W/VALVE (MISCELLANEOUS) ×4 IMPLANT
CLOSURE WOUND 1/4X4 (GAUZE/BANDAGES/DRESSINGS)
COVER WAND RF STERILE (DRAPES) ×2 IMPLANT
CUFF TOURN 18 STER (MISCELLANEOUS) ×4 IMPLANT
CUFF TOURN DUAL PL 12 NO SLV (MISCELLANEOUS) ×2 IMPLANT
CUFF TOURN SGL QUICK 12 (TOURNIQUET CUFF) IMPLANT
CUFF TOURN SGL QUICK 18X4 (TOURNIQUET CUFF) IMPLANT
DRAPE FLUOR MINI C-ARM 54X84 (DRAPES) ×2 IMPLANT
DURAPREP 26ML APPLICATOR (WOUND CARE) ×2 IMPLANT
ELECT REM PT RETURN 9FT ADLT (ELECTROSURGICAL) ×4
ELECTRODE REM PT RTRN 9FT ADLT (ELECTROSURGICAL) ×2 IMPLANT
GAUZE 4X4 16PLY RFD (DISPOSABLE) ×2 IMPLANT
GAUZE SPONGE 4X4 12PLY STRL (GAUZE/BANDAGES/DRESSINGS) ×4 IMPLANT
GAUZE XEROFORM 1X8 LF (GAUZE/BANDAGES/DRESSINGS) ×4 IMPLANT
GLOVE BIO SURGEON STRL SZ7.5 (GLOVE) ×4 IMPLANT
GLOVE INDICATOR 8.0 STRL GRN (GLOVE) ×4 IMPLANT
GOWN STRL REUS W/ TWL LRG LVL3 (GOWN DISPOSABLE) ×4 IMPLANT
GOWN STRL REUS W/TWL LRG LVL3 (GOWN DISPOSABLE) ×4
HANDPIECE VERSAJET DEBRIDEMENT (MISCELLANEOUS) ×4 IMPLANT
KIT DRSG VAC SLVR GRANUFM (MISCELLANEOUS) IMPLANT
KIT TURNOVER KIT A (KITS) ×4 IMPLANT
LABEL OR SOLS (LABEL) ×4 IMPLANT
NDL FILTER BLUNT 18X1 1/2 (NEEDLE) ×2 IMPLANT
NDL HYPO 25X1 1.5 SAFETY (NEEDLE) ×6 IMPLANT
NEEDLE FILTER BLUNT 18X 1/2SAF (NEEDLE) ×2
NEEDLE FILTER BLUNT 18X1 1/2 (NEEDLE) ×2 IMPLANT
NEEDLE HYPO 25X1 1.5 SAFETY (NEEDLE) ×4 IMPLANT
NS IRRIG 500ML POUR BTL (IV SOLUTION) ×4 IMPLANT
PACK EXTREMITY ARMC (MISCELLANEOUS) ×4 IMPLANT
PAD ABD DERMACEA PRESS 5X9 (GAUZE/BANDAGES/DRESSINGS) ×8 IMPLANT
PENCIL ELECTRO HAND CTR (MISCELLANEOUS) ×4 IMPLANT
RASP SM TEAR CROSS CUT (RASP) IMPLANT
SOL .9 NS 3000ML IRR  AL (IV SOLUTION) ×2
SOL .9 NS 3000ML IRR UROMATIC (IV SOLUTION) ×2 IMPLANT
SOL PREP PVP 2OZ (MISCELLANEOUS) ×4
SOLUTION PREP PVP 2OZ (MISCELLANEOUS) ×2 IMPLANT
STOCKINETTE 48X4 2 PLY STRL (GAUZE/BANDAGES/DRESSINGS) ×2 IMPLANT
STOCKINETTE STRL 4IN 9604848 (GAUZE/BANDAGES/DRESSINGS) ×4 IMPLANT
STOCKINETTE STRL 6IN 960660 (GAUZE/BANDAGES/DRESSINGS) ×4 IMPLANT
STRIP CLOSURE SKIN 1/4X4 (GAUZE/BANDAGES/DRESSINGS) ×2 IMPLANT
SUT ETHILON 3-0 FS-10 30 BLK (SUTURE) ×8
SUT ETHILON 3-0 KS 30 BLK (SUTURE) ×2 IMPLANT
SUT ETHILON 4-0 (SUTURE)
SUT ETHILON 4-0 FS2 18XMFL BLK (SUTURE)
SUT VIC AB 3-0 SH 27 (SUTURE)
SUT VIC AB 3-0 SH 27X BRD (SUTURE) ×2 IMPLANT
SUT VIC AB 4-0 FS2 27 (SUTURE) ×2 IMPLANT
SUTURE EHLN 3-0 FS-10 30 BLK (SUTURE) ×2 IMPLANT
SUTURE ETHLN 4-0 FS2 18XMF BLK (SUTURE) ×2 IMPLANT
SWAB DUAL CULTURE TRANS RED ST (MISCELLANEOUS) ×2 IMPLANT
SYR 10ML LL (SYRINGE) ×8 IMPLANT
SYR 3ML LL SCALE MARK (SYRINGE) ×4 IMPLANT

## 2019-07-19 NOTE — Interval H&P Note (Signed)
History and Physical Interval Note:  07/19/2019 4:30 PM  Scott [name]  has presented today for surgery, with the diagnosis of n/a.  The various methods of treatment have been discussed with the patient and family. After consideration of risks, benefits and other options for treatment, the patient has consented to  Procedure(s): AMPUTATION 2nd TOE PARTIAL RAY RESECTION (Left) IRRIGATION AND DEBRIDEMENT FOOT (Left) APPLICATION OF WOUND VAC (Left) as a surgical intervention.  The patient's history has been reviewed, patient examined, no change in status, stable for surgery.  I have reviewed the patient's chart and labs.  Questions were answered to the patient's satisfaction.     Durward Fortes

## 2019-07-19 NOTE — TOC Initial Note (Signed)
Transition of Care [facility]) - Initial/Assessment Note    Patient Details  Name: Scott [name] MRN: [id number] Date of Birth: 1961/07/26  Transition of Care [facility]) CM/SW Contact:    Beverly Sessions, RN Phone Number: 07/19/2019, 3:29 PM  Clinical Narrative:                  Patient was admitted for PAD. Patient to have additional debridement of his second toe on the left foot. Patient arrived from home. Patient lives at home with his girlfriend. Patient states that previous admissions that girlfriend was responsible for dressings of his left foot. Also states that he has has home IV antibiotics in the past. PCP is [facility] and also uses their pharmacy. Patient denies issues of obtaining medications. Patient states that his girlfriend will be providing transportation post discharge. Patient stated that he has a rolling walker at the home for ambulation. Patient states that he has previously had home health but cannot recall the home agency. Patient was open to with [facility] in 2018. Previous admission in September of 2020, referral was made to [facility]. Awaiting determination if patient is still open with [facility] services. Nurses to ambulate patient post operation.   Expected Discharge Plan: [place] Barriers to Discharge: Continued Medical Work up   Patient Goals and CMS Choice Patient states their goals for this hospitalization and ongoing recovery are:: "complete procedure and get back home" CMS Medicare.gov Compare Post Acute Care list provided to:: Patient Choice offered to / list presented to : Patient  Expected Discharge Plan and Services Expected Discharge Plan: [place]   Discharge Planning Services: CM Consult   Living arrangements for the past 2 months: Single Family Home                                      Prior Living Arrangements/Services Living arrangements for the past 2 months:  Single Family Home Lives with:: Significant Other Patient language and need for interpreter reviewed:: Yes Do you feel safe going back to the place where you live?: Yes      Need for Family Participation in Patient Care: Yes (Comment) Care giver support system in place?: Yes (comment) Current home services: DME Criminal Activity/Legal Involvement Pertinent to Current Situation/Hospitalization: No - Comment as needed  Activities of Daily Living Home Assistive Devices/Equipment: Other (Comment), Cane (specify quad or straight), Walker (specify type)(peritoneal dialysis ) ADL Screening (condition at time of admission) Patient's cognitive ability adequate to safely complete daily activities?: Yes Is the patient deaf or have difficulty hearing?: No Does the patient have difficulty seeing, even when wearing glasses/contacts?: No Does the patient have difficulty concentrating, remembering, or making decisions?: No Patient able to express need for assistance with ADLs?: Yes Does the patient have difficulty dressing or bathing?: No Independently performs ADLs?: Yes (appropriate for developmental age) Does the patient have difficulty walking or climbing stairs?: No Weakness of Legs: Both Weakness of Arms/Hands: None  Permission Sought/Granted                  Emotional Assessment Appearance:: Appears stated age Attitude/Demeanor/Rapport: Engaged, Charismatic, Gracious Affect (typically observed): Accepting, Adaptable Orientation: : Oriented to Self, Oriented to Place, Oriented to  Time, Oriented to Situation   Psych Involvement: No (comment)  Admission diagnosis:  PAD (peripheral artery disease) (HCC) [I73.9] Ulcerated,  foot, right, with necrosis of muscle ([place]) [L97.513] Patient Active Problem List   Diagnosis Date Noted  . PAD (peripheral artery disease) ([place]) 07/17/2019  . Osteomyelitis of second toe of left foot ([place]) 07/17/2019  . Type II diabetes mellitus ([place])   . PVD  (peripheral vascular disease) ([place])   . Non-Q wave myocardial infarction ([place])   . Hypertension   . Coronary artery disease   . Ulcerated, foot, right, with necrosis of muscle ([place])   . Atherosclerosis of native arteries of the extremities with ulceration ([place]) 07/03/2019  . Anemia 04/15/2019  . Cellulitis 01/21/2019  . History of colonic polyps   . Positive fecal occult blood test   . 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 Atherosclerotic heart disease of native coronary artery without angina pectoris 06/02/2017  . Encounter for screening for depression 06/02/2017  . Gastro-esophageal reflux disease with esophagitis 06/02/2017  . Other asthma 06/02/2017  . Other specified arthritis, unspecified site 06/02/2017  . Acidosis 05/31/2017  . Anemia in chronic kidney disease 05/31/2017  . Hyperkalemia 05/31/2017  . Iron deficiency anemia, unspecified 05/31/2017  . Liver disease, unspecified 05/31/2017  . Moderate protein-calorie malnutrition ([place]) 05/31/2017  . Other dietary vitamin B12 deficiency anemia 05/31/2017  . Other disorders of electrolyte and fluid balance, not elsewhere classified 05/31/2017  . Other disorders resulting from impaired renal tubular function 05/31/2017  . Other hyperlipidemia 05/31/2017  . Other long term (current) drug therapy 05/31/2017  . Secondary hyperparathyroidism of renal origin ([place]) 05/31/2017  . Unspecified jaundice 05/31/2017  . ESRD (end stage renal disease) ([place]) 11/25/2016  . Tobacco abuse 11/25/2016  . Non-ST elevation (NSTEMI) myocardial infarction ([place]) 10/21/2016  . Pure hypercholesterolemia 10/21/2016  . Type 2 diabetes mellitus with other diabetic kidney complication ([place]) 38/93/7342  . GERD (gastroesophageal reflux disease) 04/04/2015   PCP:  Inc, [place]:   [place], [place] [address] Phone:  [phone] Fax: [phone]     Social Determinants of Health (SDOH) Interventions    Readmission Risk Interventions Readmission Risk Prevention Plan 07/19/2019 07/19/2019  Transportation Screening Complete Complete  Medication Review Press photographer) Complete Complete  HRI or Home Care Consult Complete Complete  Palliative Care Screening Not Applicable -  [place] Not Applicable -  Some recent data might be hidden

## 2019-07-19 NOTE — Progress Notes (Signed)
[facility], [place] 07/19/19  Subjective:   LOS: 2 11/17 0701 - 11/18 0700 In: 603.1 [P.O.:480; I.V.:123.1] Out: 200 [Urine:200] Patient known to our practice from outpatient dialysis.  This time he is admitted for necrotic wound on the left foot with exposure of the bone.  Diagnosed with osteomyelitis.  Angiogram shows peripheral vascular disease with gangrenous changes to the left second toe and forefoot with nonhealing previous first ray amputation. Nephrology consult requested for continuation of peritoneal dialysis Patient denies any nausea or vomiting.  No shortness of breath Ultrafiltration from peritoneal dialysis was 2300 cc   Objective:  Vital signs in last 24 hours:  Temp:  [97 F (36.1 C)-98 F (36.7 C)] 97 F (36.1 C) (11/18 1836) Pulse Rate:  [65-78] 65 (11/18 1836) Resp:  [11-20] 11 (11/18 1836) BP: (97-121)/(38-80) 114/80 (11/18 1836) SpO2:  [91 %-100 %] 97 % (11/18 1836) Weight:  [90.5 kg] 90.5 kg (11/18 0500)  Weight change: -5.209 kg Filed Weights   07/17/19 1219 07/17/19 1541 07/19/19 0500  Weight: 95.7 kg 95.4 kg 90.5 kg    Intake/Output:    Intake/Output Summary (Last 24 hours) at 07/19/2019 1845 Last data filed at 07/19/2019 1834 Gross per 24 hour  Intake 1025.14 ml  Output 451 ml  Net 574.14 ml     Physical Exam: General:  No acute distress, sitting up in the bed  HEENT  moist oral mucous membranes  Pulm/lungs  normal breathing effort, clear to auscultation  CVS/Heart  no rub or gallop  Abdomen:   Soft, nontender  Extremities:  Left foot is bandaged, trace edema  Neurologic:  Alert, oriented  Skin:  Congestive changes on the legs  Access:  Right arm AV fistula, PD catheter in place       Basic Metabolic Panel:  Recent Labs  Lab 07/17/19 1359 07/18/19 0857 07/19/19 0755  NA 135  --  135  K 4.7 4.7 4.5  CL 98  --  93*  CO2 22  --  23  GLUCOSE 128*  --  116*  BUN 47*  --  49*  CREATININE 7.24*  --   7.10*  CALCIUM 8.1*  --  7.3*     CBC: Recent Labs  Lab 07/17/19 1359 07/19/19 0755  WBC 10.6* 10.8*  NEUTROABS 8.5*  --   HGB 9.6* 8.0*  HCT 30.4* 24.4*  MCV 94.7 92.1  PLT 361 285      Lab Results  Component Value Date   HEPBSAG Negative 01/19/2017   HEPBSAB Non Reactive 01/18/2017   HEPBIGM Negative 01/18/2017      Microbiology:  Recent Results (from the past 240 hour(s))  SARS CORONAVIRUS 2 (TAT 6-24 HRS) Nasopharyngeal Nasopharyngeal Swab     Status: None   Collection Time: 07/14/19  1:11 PM   Specimen: Nasopharyngeal Swab  Result Value Ref Range Status   SARS Coronavirus 2 NEGATIVE NEGATIVE Final    Comment: (NOTE) SARS-CoV-2 target nucleic acids are NOT DETECTED. The SARS-CoV-2 RNA is generally detectable in upper and lower respiratory specimens during the acute phase of infection. Negative results do not preclude SARS-CoV-2 infection, do not rule out co-infections with other pathogens, and should not be used as the sole basis for treatment or other patient management decisions. Negative results must be combined with clinical observations, patient history, and epidemiological information. The expected result is Negative. Fact Sheet for Patients: SugarRoll.be Fact Sheet for Healthcare Providers: https://www.woods-mathews.com/ This test is not yet approved or cleared by the  [place] Architectural technologist and  has been authorized for detection and/or diagnosis of SARS-CoV-2 by FDA under an Print production planner (EUA). This EUA will remain  in effect (meaning this test can be used) for the duration of the COVID-19 declaration under Section 56 4(b)(1) of the Act, 21 U.S.C. section 360bbb-3(b)(1), unless the authorization is terminated or revoked sooner. Performed at [facility], [place] [address], [place], [place] [address]   Surgical PCR screen     Status: None   Collection Time: 07/18/19  6:35 AM   Specimen:  Nasal Mucosa; Nasal Swab  Result Value Ref Range Status   MRSA, PCR NEGATIVE NEGATIVE Final   Staphylococcus aureus NEGATIVE NEGATIVE Final    Comment: (NOTE) The Xpert SA Assay (FDA approved for NASAL specimens in patients 50 years of age and older), is one component of a comprehensive surveillance program. It is not intended to diagnose infection nor to guide or monitor treatment. Performed at [facility], [address] [place], [place] [address]     Coagulation Studies: Recent Labs    07/17/19 1359  LABPROT 14.7  INR 1.2    Urinalysis: No results for input(s): COLORURINE, LABSPEC, PHURINE, GLUCOSEU, HGBUR, BILIRUBINUR, KETONESUR, PROTEINUR, UROBILINOGEN, NITRITE, LEUKOCYTESUR in the last 72 hours.  Invalid input(s): APPERANCEUR    Imaging: No results found.   Medications:   . [MAR Hold] sodium chloride    . [MAR Hold] sodium chloride    . ceFAZolin    . [MAR Hold] cefTRIAXone (ROCEPHIN)  IV Stopped (07/19/19 1330)   . [MAR Hold] atorvastatin  40 mg Oral Daily  . [MAR Hold] calcitRIOL  0.25 mcg Oral Daily  . [MAR Hold] calcium acetate  2,001 mg Oral TID WC  . [MAR Hold] carvedilol  25 mg Oral BID WC  . [MAR Hold] cholecalciferol  2,000 Units Oral Daily  . [MAR Hold] feeding supplement (NEPRO CARB STEADY)  237 mL Oral BID BM  . [MAR Hold] furosemide  80 mg Oral Daily  . [MAR Hold] gentamicin cream  1 application Topical Daily  . [MAR Hold] hydrALAZINE  25 mg Oral TID  . [MAR Hold] insulin aspart  0-9 Units Subcutaneous Q6H  . [MAR Hold] isosorbide mononitrate  30 mg Oral Daily  . [MAR Hold] multivitamin  1 tablet Oral Daily  . [MAR Hold] mupirocin ointment  1 application Nasal BID  . [MAR Hold] sodium chloride flush  3 mL Intravenous Q12H  . [MAR Hold] sodium chloride flush  3 mL Intravenous Q12H  . [MAR Hold] tamsulosin  0.4 mg Oral Daily  . [MAR Hold] vitamin C  500 mg Oral BID   [MAR Hold] sodium chloride, [MAR Hold] sodium chloride, [MAR  Hold] albuterol, [MAR Hold] alum & mag hydroxide-simeth, [MAR Hold] calcium acetate, fentaNYL (SUBLIMAZE) injection, [MAR Hold] heparin, [MAR Hold] HYDROcodone-acetaminophen, [MAR Hold]  HYDROmorphone (DILAUDID) injection, [MAR Hold]  morphine injection, [MAR Hold]  morphine injection, [MAR Hold] nitroGLYCERIN, [MAR Hold] ondansetron (ZOFRAN) IV, ondansetron (ZOFRAN) IV, [MAR Hold] oxyCODONE, [MAR Hold] sodium chloride flush, [MAR Hold] sodium chloride flush  Assessment/ Plan:  58 y.o. male with  end stage renal disease on peritoneal dialysis, congestive heart failure, GERD, hypertension, peripheral vascular disease, diabetes mellitus type 2, diabetic retinopathy, diabetic gastroparesis, admitted with gangrene of left first toe.  [facility]/CAPD/[address] CAPD 1875mL fills 4 exchanges  Principal Problem:   PAD (peripheral artery disease) (HCC) Active Problems:   Type 2 diabetes mellitus with other diabetic kidney complication ([place])  ESRD (end stage renal disease) ([place])   Other hyperlipidemia   Type II diabetes mellitus ([place])   Coronary artery disease   Osteomyelitis of second toe of left foot (HCC)   Ulcerated, foot, right, with necrosis of muscle ([place])   #. ESRD. On CAPD  Recent Labs    07/17/19 1359 07/19/19 0755  CREATININE 7.24* 7.10*  will arrange CCPD while in the hospital  #. Anemia of CKD  Lab Results  Component Value Date   HGB 8.0 (L) 07/19/2019  will monitor for need of EPO  #.  Secondary hyperparathyroidism    Component Value Date/Time   PTH 164 (H) 09/18/2018 1439   Lab Results  Component Value Date   PHOS 8.7 (H) 05/25/2019  continue home dose of PhosLo   #. Diabetes type 2 with CKD Hemoglobin A1C (no units)  Date Value  09/17/2015 9.1   Hgb A1c MFr Bld (%)  Date Value  07/17/2019 6.0 (H)   # Osteomyelitis of left foot with necrosis and osteomyelitis of first metatarsal, gangrenous changes with deep tissue ulceration - Plan as per podiatrist and IM  team -Debridement of infected bone is planned.  #Right hydroureteronephrosis -Following with urologist Dr. Erlene Quan -cystoscopy is planned   LOS: [place] 11/18/20206:45 PM  [place], [place]

## 2019-07-19 NOTE — Anesthesia Preprocedure Evaluation (Addendum)
Anesthesia Evaluation  Patient identified by MRN, date of birth, ID band Patient awake    Reviewed: Allergy & Precautions, NPO status , Patient's Chart, lab work & pertinent test results  History of Anesthesia Complications Negative for: history of anesthetic complications  Airway Mallampati: II  TM Distance: >3 FB Neck ROM: Full    Dental  (+) Edentulous Upper, Edentulous Lower   Pulmonary asthma , Patient abstained from smoking., former smoker,    breath sounds clear to auscultation- rhonchi (-) wheezing      Cardiovascular hypertension, + CAD, + Past MI, + Cardiac Stents, + Peripheral Vascular Disease and +CHF  (-) dysrhythmias  Rhythm:Regular Rate:Normal - Systolic murmurs and - Diastolic murmurs    Neuro/Psych neg Seizures PSYCHIATRIC DISORDERS Depression negative neurological ROS     GI/Hepatic GERD  Medicated and Controlled,  Endo/Other  diabetes, Type 2, Insulin Dependent  Renal/GU ESRF and DialysisRenal disease (PD)     Musculoskeletal  (+) Arthritis ,   Abdominal (+) + obese,   Peds  Hematology  (+) anemia ,   Anesthesia Other Findings   Reproductive/Obstetrics                                                            Anesthesia Evaluation  Patient identified by MRN, date of birth, ID band Patient awake    Reviewed: Allergy & Precautions, NPO status , Patient's Chart, lab work & pertinent test results  History of Anesthesia Complications Negative for: history of anesthetic complications  Airway Mallampati: II  TM Distance: >3 FB Neck ROM: Full    Dental  (+) Edentulous Upper, Edentulous Lower   Pulmonary asthma , Patient abstained from smoking., former smoker,    breath sounds clear to auscultation- rhonchi (-) wheezing      Cardiovascular hypertension, + CAD, + Past MI, + Cardiac Stents, + Peripheral Vascular Disease and +CHF   Rhythm:Regular  Rate:Normal - Systolic murmurs and - Diastolic murmurs    Neuro/Psych neg Seizures PSYCHIATRIC DISORDERS Depression negative neurological ROS     GI/Hepatic GERD  ,  Endo/Other  diabetes, Insulin Dependent  Renal/GU ESRF and DialysisRenal disease (PD)     Musculoskeletal  (+) Arthritis ,   Abdominal (+) + obese,   Peds  Hematology  (+) anemia ,   Anesthesia Other Findings Past Medical History: No date: Arthritis     Comment:  "left arm; right leg" (12/13/2014) No date: Asthma No date: CHF (congestive heart failure) (HCC) No date: Chronic disease anemia     Comment:  [name] 12/13/2014 No date: Chronic kidney disease (CKD), stage IV (severe) ([place])     Comment:  [name] 12/13/2014... on dialysis No date: Complication of anesthesia     Comment:  unable to urinate after CAPD urgery No date: Continuous ambulatory peritoneal dialysis status (HCC) No date: Coronary artery disease     Comment:  /notes 12/13/2014 No date: Depression No date: Dysrhythmia     Comment:  patient unaware of irregular heartbeat No date: GERD (gastroesophageal reflux disease) No date: High cholesterol     Comment:  /notes 12/13/2014 No date: Hypertension No date: Non-Q wave myocardial infarction [facility])     Comment:  [name] 12/13/2014 No date: PVD (peripheral vascular disease) ([place])     Comment:  [name] 12/13/2014 No date: Type  II diabetes mellitus ([place])   Reproductive/Obstetrics                             Anesthesia Physical Anesthesia Plan  ASA: IV  Anesthesia Plan: General   Post-op Pain Management:    Induction: Intravenous  PONV Risk Score and Plan: 1 and Ondansetron  Airway Management Planned: Oral ETT  Additional Equipment:   Intra-op Plan:   Post-operative Plan: Extubation in OR  Informed Consent: I have reviewed the patients History and Physical, chart, labs and discussed the procedure including the risks, benefits and alternatives for the proposed  anesthesia with the patient or authorized representative who has indicated his/her understanding and acceptance.     Dental advisory given  Plan Discussed with: CRNA and Anesthesiologist  Anesthesia Plan Comments:         Anesthesia Quick Evaluation  Anesthesia Physical  Anesthesia Plan  ASA: IV  Anesthesia Plan: General   Post-op Pain Management:    Induction: Intravenous  PONV Risk Score and Plan: 1 and Ondansetron  Airway Management Planned: Oral ETT  Additional Equipment:   Intra-op Plan:   Post-operative Plan: Extubation in OR  Informed Consent: I have reviewed the patients History and Physical, chart, labs and discussed the procedure including the risks, benefits and alternatives for the proposed anesthesia with the patient or authorized representative who has indicated his/her understanding and acceptance.     Dental advisory given  Plan Discussed with: CRNA and Anesthesiologist  Anesthesia Plan Comments:         Anesthesia Quick Evaluation

## 2019-07-19 NOTE — Transfer of Care (Signed)
Immediate Anesthesia Transfer of Care Note  Patient: Scott [name]  Procedure(s) Performed: AMPUTATION 2nd TOE PARTIAL RAY RESECTION (Left Toe) IRRIGATION AND DEBRIDEMENT FOOT (Left )  Patient Location: PACU  Anesthesia Type:General  Level of Consciousness: drowsy and patient cooperative  Airway & Oxygen Therapy: Patient Spontanous Breathing and Patient connected to face mask oxygen  Post-op Assessment: Report given to RN and Post -op Vital signs reviewed and stable  Post vital signs: Reviewed and stable  Last Vitals:  Vitals Value Taken Time  BP 121/78 07/19/19 1806  Temp 36.3 C 07/19/19 1806  Pulse 66 07/19/19 1809  Resp 20 07/19/19 1809  SpO2 100 % 07/19/19 1809  Vitals shown include unvalidated device data.  Last Pain:  Vitals:   07/19/19 1558  TempSrc: Tympanic  PainSc:          Complications: No apparent anesthesia complications

## 2019-07-19 NOTE — Anesthesia Procedure Notes (Signed)
Procedure Name: LMA Insertion Date/Time: 07/19/2019 4:50 PM Performed by: Jonna Clark, CRNA Pre-anesthesia Checklist: Patient identified, Patient being monitored, Timeout performed, Emergency Drugs available and Suction available Patient Re-evaluated:Patient Re-evaluated prior to induction Oxygen Delivery Method: Circle system utilized Preoxygenation: Pre-oxygenation with 100% oxygen Induction Type: IV induction Ventilation: Mask ventilation without difficulty LMA: LMA inserted LMA Size: 4.5 Tube type: Oral Number of attempts: 1 Placement Confirmation: positive ETCO2 and breath sounds checked- equal and bilateral Tube secured with: Tape Dental Injury: Teeth and Oropharynx as per pre-operative assessment

## 2019-07-19 NOTE — Progress Notes (Addendum)
Pharmacy Antibiotic Note  Scott [name] is a 58 y.o. male admitted on 07/17/2019 with osteomyelitis. He has ESRD for which he undergoes peritoneal dialysis and receives this treatment overnight. 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 He underwent a successful recanalization of the left lower extremity for limb salvage performed by Dr [name] on 11/17. Dr Cleda Mccreedy is planning a limited debridement of the left foot with second ray amputation today  Vancomycin doses: 11/16 1830 1000 mg 11/17 1905 1250 mg vancomycin levels  12 mcg/mL 11/17 0522 23 mcg/mL 11/18 0755   Goal level < 20 mcg/mL  Plan:  Follow-up vancomycin random level in am  Redose patient w/ vancomycin 15 mg/kg when level is less than 20 mcg/mL  Height: 5\' 9"  (175.3 cm) Weight: 199 lb 8.3 oz (90.5 kg) IBW/kg (Calculated) : 70.7  Temp (24hrs), Avg:97.7 F (36.5 C), Min:97.4 F (36.3 C), Max:98 F (36.7 C)  Recent Labs  Lab 07/17/19 1359 07/17/19 1530 07/17/19 1823 07/18/19 0522  WBC 10.6*  --   --   --   CREATININE 7.24*  --   --   --   LATICACIDVEN  --  1.3 1.4  --   VANCORANDOM  --   --   --  12    Estimated Creatinine Clearance: 12.1 mL/min (A) (by C-G formula based on SCr of 7.24 mg/dL (H)).    Antimicrobials this admission: 11/16 ceftriaxone >>  11/16 vancomycin >>   Microbiology results: 11/13 SARS CoV-2: negative  11/17 MRSA PCR: negative Thank you for allowing pharmacy to be a part of this patient's care.  Vallery Sa, PharmD Clinical Pharmacist 07/19/2019 7:21 AM

## 2019-07-19 NOTE — Progress Notes (Signed)
[facility]  Daily Progress Note   Subjective  -postop day 1  Patient states left foot is feeling better denies groin pain  Objective Vitals:   07/19/19 0906 07/19/19 1213 07/19/19 1517 07/19/19 1558  BP: (!) 110/38 (!) 110/58 (!) 101/49 (!) 119/45  Pulse: 78 73 75 77  Resp: 17 16 16 15   Temp: 97.9 F (36.6 C) 97.8 F (36.6 C) 97.9 F (36.6 C) (!) 97 F (36.1 C)  TempSrc:  Oral Oral Tympanic  SpO2: 93% 91% 92% 95%  Weight:      Height:        Intake/Output Summary (Last 24 hours) at 07/19/2019 1652 Last data filed at 07/19/2019 1502 Gross per 24 hour  Intake 800.14 ml  Output 401 ml  Net 399.14 ml    PULM  Normal effort , no use of accessory muscles CV  No JVD, RRR Abd      No distended, nontender VASC  left foot is warm with brisk capillary refill.  I am unable to palpate a dorsalis pedis pulse  Laboratory CBC    Component Value Date/Time   WBC 10.8 (H) 07/19/2019 0755   HGB 8.0 (L) 07/19/2019 0755   HGB 10.7 (L) 12/13/2014 0102   HCT 24.4 (L) 07/19/2019 0755   HCT 33.8 (L) 12/13/2014 0102   PLT 285 07/19/2019 0755   PLT 510 (H) 12/13/2014 0102    BMET    Component Value Date/Time   NA 135 07/19/2019 0755   NA 140 04/16/2015   NA 140 12/13/2014 0102   K 4.5 07/19/2019 0755   K 4.6 12/13/2014 0102   CL 93 (L) 07/19/2019 0755   CL 111 12/13/2014 0102   CO2 23 07/19/2019 0755   CO2 23 12/13/2014 0102   GLUCOSE 116 (H) 07/19/2019 0755   GLUCOSE 213 (H) 12/13/2014 0102   BUN 49 (H) 07/19/2019 0755   BUN 44 (A) 04/16/2015   BUN 25 (H) 12/13/2014 0102   CREATININE 7.10 (H) 07/19/2019 0755   CREATININE 1.95 (H) 12/13/2014 0102   CALCIUM 7.3 (L) 07/19/2019 0755   CALCIUM 8.0 (L) 12/13/2014 0102   GFRNONAA 8 (L) 07/19/2019 0755   GFRNONAA 37 (L) 12/13/2014 0102   GFRAA 9 (L) 07/19/2019 0755   GFRAA 43 (L) 12/13/2014 0102    Assessment/Planning: POD #1 s/p left leg angiography with intervention   Patient has done well  post angiogram.  His foot is very warm to the touch.  I have reviewed with Dr. Caryl Comes the angiogram results and we will proceed with debridement of his foot wounds.    I also reviewed with the patient the fact that we found a retained piece of catheter most likely from his angiogram last month but this was removed quite easily and there were no changes to the arterial status or damage to the superficial femoral artery caused by this catheter.    I will continue to follow and have discussed with the patient the possibility of yet another angiogram to try to recruit the peroneal artery so that we could potentially have two-vessel runoff to improve wound healing.    Hortencia Pilar  07/19/2019, 4:52 PM

## 2019-07-19 NOTE — Consult Note (Signed)
ANTICOAGULATION CONSULT NOTE  Pharmacy Consult for heparin drip Indication: VTE Treatment   Patient Measurements: Height: 5\' 9"  (175.3 cm) Weight: 199 lb 8.3 oz (90.5 kg) IBW/kg (Calculated) : 70.7 Heparin Dosing Weight: 90.5 kg  Vital Signs: Temp: 98.3 F (36.8 C) (11/18 1849) Temp Source: Oral (11/18 1849) BP: 113/53 (11/18 1849) Pulse Rate: 69 (11/18 1849)  Labs: Recent Labs    07/17/19 1359 07/18/19 0522 07/18/19 2347 07/19/19 0755  HGB 9.6*  --   --  8.0*  HCT 30.4*  --   --  24.4*  PLT 361  --   --  285  LABPROT 14.7  --   --   --   INR 1.2  --   --   --   HEPARINUNFRC  --   --  0.29* <0.10*  CREATININE 7.24*  --   --  7.10*  CKTOTAL  --  88  --   --     Estimated Creatinine Clearance: 12.3 mL/min (A) (by C-G formula based on SCr of 7.1 mg/dL (H)).   Medical History: Past Medical History:  Diagnosis Date  . Acute respiratory failure with hypoxia ([place]) 09/17/2018  . Arthritis    "left arm; right leg" (12/13/2014)  . Asthma   . CHF (congestive heart failure) ([place])   . Chronic disease anemia    [name] 12/13/2014  . Chronic kidney disease (CKD), stage IV (severe) ([place])    [name] 12/13/2014... on dialysis  . Complication of anesthesia    unable to urinate after CAPD urgery  . Continuous ambulatory peritoneal dialysis status ([place])   . Coronary artery disease    [name] 12/13/2014  . Depression   . Dysrhythmia    patient unaware of irregular heartbeat  . GERD (gastroesophageal reflux disease)   . High cholesterol    [name] 12/13/2014  . Hypertension   . Non-Q wave myocardial infarction ([place])    [name] 12/13/2014  . PVD (peripheral vascular disease) ([place])    [name] 12/13/2014  . Type II diabetes mellitus (HCC)     Medications:  Scheduled:  . atorvastatin  40 mg Oral Daily  . calcitRIOL  0.25 mcg Oral Daily  . calcium acetate  2,001 mg Oral TID WC  . carvedilol  25 mg Oral BID WC  . cholecalciferol  2,000 Units Oral Daily  . feeding supplement (NEPRO  CARB STEADY)  237 mL Oral BID BM  . furosemide  80 mg Oral Daily  . gentamicin cream  1 application Topical Daily  . hydrALAZINE  25 mg Oral TID  . insulin aspart  0-9 Units Subcutaneous Q6H  . isosorbide mononitrate  30 mg Oral Daily  . multivitamin  1 tablet Oral Daily  . mupirocin ointment  1 application Nasal BID  . sodium chloride flush  3 mL Intravenous Q12H  . sodium chloride flush  3 mL Intravenous Q12H  . tamsulosin  0.4 mg Oral Daily  . vitamin C  500 mg Oral BID    Assessment: 58 y.o. male with medical history significant of  Rt toe pain with extensive and severe PAD history. Today Dr [name] performed an angiogram and is recommending heparin with 1/2 of the nomogram initial dose then per nomogram. He is on no chronic anticoagulation PTA. Baseline labs:  INR 1.2, Hgb 9.6, PLT wnl  Heparin was stopped earlier today for a procedure. Last HL was <0.10 @0755  today. Heparin rate was not changed was a result of this level not being therapeutic. Of note, heparin  was stopped at 1215 today.   Goal of Therapy:   Heparin level 0.3-0.7 units/ml Monitor platelets by anticoagulation protocol: Yes   Plan:  Will order heparin bolus 2700 units.  Will increase heparin rate to 1350 units/hr and recheck 6 hours. Will check CBC with AM labs.   Rowland Lathe, PharmD 07/19/2019,7:05 PM

## 2019-07-19 NOTE — Anesthesia Post-op Follow-up Note (Signed)
Anesthesia QCDR form completed.        

## 2019-07-19 NOTE — Op Note (Signed)
Date of operation: 07/19/2019.  Surgeon: Durward Fortes D.P.M.  Preoperative diagnosis: 1.  Osteomyelitis left first metatarsal. 2.  Gangrenous changes second toe.  Postoperative diagnosis: Same.  Procedure: 1.  His incision with debridement of infected bone and soft tissue left first metatarsal. 2.  Amputation left second toe with partial ray resection.  Anesthesia: LMA.  Hemostasis: None.  Estimated blood loss: Approximately 50 cc.  Pathology: Left first metatarsal.  Second toe with partial ray.  Cultures: Bone cultures from the left first metatarsal as well as the left second metatarsal.  Injectables: 10 cc 0.5% Marcaine plain.  Complications: None apparent.  Operative indications: This is a 58 year old male with significant history of diabetes and associated neuropathy as well as significant peripheral vascular disease.  Recently revascularized for a nonhealing first ray amputation site and decision was made for repeat debridement of the first ray with amputation second toe and partial ray of the left foot.  Operative procedure: Patient was taken to the operating room and placed on the table in the supine position.  Following satisfactory LMA the left foot was prepped and draped in the usual sterile fashion.  Attention was then directed to the left foot where an incision was made at the proximal aspect of the open wound from the failed first ray resection and carried sharply down to bone.  The base of the first metatarsal was freed from the normal surrounding anatomy and a section was removed using a sagittal saw.  Appear to be good healthy bone at the cut edge.  A sample was taken for bone culture and the specimen was passed off the table.  Excisional debridement of devitalized and necrotic soft tissue was performed around the edges of the open wound sharply using a 15 blade.    Attention was then directed to the base of the second toe where an elliptical incision was made coursing  from lateral to medial across the dorsal and plantar aspect of the second toe with care taken to excise all of the necrotic tissue towards the interspace.  Incision was carried sharply down along the bone back past the metatarsal phalangeal joint to the shaft of the metatarsal where the bone was incised using a sagittal saw and the second ray was removed from the field in toto.  Devitalized tissue was then debrided from the edges of the wound using a versa jet debrider on a setting of 3-5.  Some debulking of the devitalized tissue was noted along the plantar flap and was excised in toto.  The wound was flushed with copious amounts of sterile saline and then was able to be reapproximated using 3-0 nylon vertical mattress and simple erupted sutures.  Decision was made to hold off on a wound VAC at this point.  Xeroform 4 x 4's ABDs and Kerlix applied to the left foot followed by a second Kerlix and Ace wrap.  Patient tolerated the procedure and anesthesia well was transported to the PACU with vital signs stable and in good condition.

## 2019-07-19 NOTE — Consult Note (Signed)
ANTICOAGULATION CONSULT NOTE  Pharmacy Consult for heparin drip Indication: VTE prophylaxis  Patient Measurements: Height: 5\' 9"  (175.3 cm) Weight: 210 lb 5.1 oz (95.4 kg) IBW/kg (Calculated) : 70.7 Heparin Dosing Weight: 90.5 kg  Vital Signs: Temp: 97.7 F (36.5 C) (11/17 2118) Temp Source: Oral (11/17 2118) BP: 97/40 (11/17 2118) Pulse Rate: 69 (11/17 2118)  Labs: Recent Labs    07/17/19 1359 07/18/19 0522 07/18/19 2347  HGB 9.6*  --   --   HCT 30.4*  --   --   PLT 361  --   --   LABPROT 14.7  --   --   INR 1.2  --   --   HEPARINUNFRC  --   --  0.29*  CREATININE 7.24*  --   --   CKTOTAL  --  88  --     Estimated Creatinine Clearance: 12.4 mL/min (A) (by C-G formula based on SCr of 7.24 mg/dL (H)).   Medical History: Past Medical History:  Diagnosis Date  . Acute respiratory failure with hypoxia ([place]) 09/17/2018  . Arthritis    "left arm; right leg" (12/13/2014)  . Asthma   . CHF (congestive heart failure) ([place])   . Chronic disease anemia    [name] 12/13/2014  . Chronic kidney disease (CKD), stage IV (severe) ([place])    [name] 12/13/2014... on dialysis  . Complication of anesthesia    unable to urinate after CAPD urgery  . Continuous ambulatory peritoneal dialysis status ([place])   . Coronary artery disease    [name] 12/13/2014  . Depression   . Dysrhythmia    patient unaware of irregular heartbeat  . GERD (gastroesophageal reflux disease)   . High cholesterol    [name] 12/13/2014  . Hypertension   . Non-Q wave myocardial infarction ([place])    [name] 12/13/2014  . PVD (peripheral vascular disease) ([place])    [name] 12/13/2014  . Type II diabetes mellitus (HCC)     Medications:  Scheduled:  . atorvastatin  40 mg Oral Daily  . calcitRIOL  0.25 mcg Oral Daily  . calcium acetate  2,001 mg Oral TID WC  . carvedilol  25 mg Oral BID WC  . cholecalciferol  2,000 Units Oral Daily  . feeding supplement (NEPRO CARB STEADY)  237 mL Oral BID BM  . furosemide  80 mg Oral  Daily  . gentamicin cream  1 application Topical Daily  . hydrALAZINE  25 mg Oral TID  . insulin aspart  0-9 Units Subcutaneous Q6H  . isosorbide mononitrate  30 mg Oral Daily  . multivitamin  1 tablet Oral Daily  . mupirocin ointment  1 application Nasal BID  . sodium chloride flush  3 mL Intravenous Q12H  . sodium chloride flush  3 mL Intravenous Q12H  . tamsulosin  0.4 mg Oral Daily  . vitamin C  500 mg Oral BID    Assessment: 58 y.o. male with medical history significant of  Rt toe pain with extensive and severe PAD history. Today Dr [name] performed an angiogram and is recommending heparin with 1/2 of the nomogram initial dose then per nomogram. He is on no chronic anticoagulation PTA. Baseline labs:  INR 1.2, Hgb 9.6, PLT wnl  Goal of Therapy:   Heparin level 0.3-0.7 units/ml Monitor platelets by anticoagulation protocol: Yes   Plan:  11/17 @ 2330 HL 0.29 subtherapeutic. Will increase rate to 1100 units/hr and recheck at 0730, patient is s/p angio, will continue to monitor CBC's.  Tobie Lords,  PharmD 07/19/2019,1:29 AM

## 2019-07-19 NOTE — Progress Notes (Signed)
PROGRESS NOTE    [name]  YPP:[id number] DOB: 1961-05-08 DOA: 07/17/2019 PCP: [facility] [place]    Brief Narrative:   Scott [name] is a 58 y.o. male with medical history significant of  Rt toe pain since 3 days no trauma /4/10 right now /off and on and unrelated to any ppting or worsening factors. Pt has been taking pain meds and it helps it. Pain throbbing in nature and  is better with elevation of his leg. .  Worse with bearing weight.  Pain is localized and NR.  Patient has a past medical history of left partial MTP amputation, peripheral arterial disease, diabetes mellitus type 2, end-stage renal disease on peritoneal dialysis seen in the emergency room for right toe pain that has been going on for the past few days.  Patient states that it is throbbing in nature and intermittent in nature nonradiating and he takes his pain medications which helps his pain. He was at his podiatrist appointment today for left foot OM and second toe gangrene with rt great toe gangrene with ulceration.He follows up with wound care clinic at [facility]. Pt is scheduled for angiogram tomorrow.  Overall pt is stable except for pain and is to be admitted for OM  And PAD causing nonhealing ischemic ulcers. Please note patient has had a nuclear myocardial perfusion study earlier this month I suspect as part of a preoperative work-up for preprocedural work-up for the patient, this study shows decreased ejection fraction to less than 30%, with a intermediate risk study no ST segment deviation noted during stress portion patient does have a medium defect of severe severity present in the apical anterior apical septal and apical inferior and apex location.  Chart review does show that patient has seen a cardiologist in 2018 however I do not see any new follow-up or office visits.  Patient does follow-up with his wound and vascular doctors and podiatrist on a routine regular basis.  ED Course:   Blood pressure (!) 137/46, pulse 85, temperature 97.9 F (36.6 C), temperature source Oral, resp. rate 18, height 5\' 9"  (1.753 m), weight 95.7 kg, SpO2 94 %.Pt is chronically ill appearing and abd is distended. Pt has been seen by Vascular surgeon and is receiving iv abx rocephin and vancomycin.  Initial labs show a white count of 10.6, hemoglobin of 9.6, RDW of 14.2, platelets of 361.  CMP shows sodium of 135, glucose of 128, BUN of 47, serum creatinine of 7.24.  Interim History: -11/17: s/p left leg angiography with intervention -11/18: had procedure: 1.  His incision with debridement of infected bone and soft tissue left first metatarsal. 2.  Amputation left second toe with partial ray resection.  Subjective:  Pt has mild SOB, and mild cough. No CP. No fever or chill. No GI symptoms.  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  Blood culture no grow so far. --Continue with antibiotics vancomycin Rocephin -Vascular surgery following -Podiatry following  Peripheral arterial disease: Vasc surg consult with angio 11/17 and  s/p left leg angiography with intervention. Pt feels better. Foot is warm. -on IV heparin  End-stage renal disease on peritoneal dialysis: -renal was consulted for dialysis -Patient did not have his peritoneal dialysis last night -Electrolyte monitoring with daily labs  Type 2 diabetes: -Continue sliding scale insulin -ADA diet -Blood glucose AC at bedtime  CAD -Antiplatelets held in anticipation of procedure -Restart  pending results of test and possible surgical intervention -Continue Coreg, Imdur, Lasix and hydralazine   DVT prophylaxis: on IV heparin Code Status: full Family Communication: no, not at bedside Disposition Plan: not ready for discharge. Still need IV heparin and IV antibiotics. arriers for discharge:    Consultants:   VVS  Renal  surgeon  Procedures: -11/17: s/p left leg angiography with intervention -11/18: had procedure: 1.  His incision with debridement of infected bone and soft tissue left first metatarsal. 2.  Amputation left second toe with partial ray resection. - 11/17: Procedure(s) Performed: 1. Introduction catheter into left lower extremity 3rd order catheter placement  2. Contrast injection left lower extremity for distal runoff with additional 3rd order  3. Retrieval portion of CXI catheter from left SFA with a trilobed snare              4. Percutaneous transluminal angioplasty and stent placement left anterior tibial  5. Thromboembolectomy with the penumbra cat 6 device left anterior tibial              6.  Star close closure right common femoral arteriotomy  Antimicrobials: Anti-infectives (From admission, onward)   Start     Dose/Rate Route Frequency Ordered Stop   07/19/19 1558  ceFAZolin (ANCEF) 1-4 GM/50ML-% IVPB    Note to Pharmacy: Sylvester Harder   : cabinet override      07/19/19 1558 07/20/19 0414   07/18/19 1400  cefTRIAXone (ROCEPHIN) 2 g in sodium chloride 0.9 % 100 mL IVPB     2 g 200 mL/hr over 30 Minutes Intravenous Every 24 hours 07/18/19 0000     07/18/19 0845  ceFAZolin (ANCEF) IVPB 1 g/50 mL premix     1 g 100 mL/hr over 30 Minutes Intravenous  Once 07/18/19 0833 07/18/19 1044   07/18/19 0700  vancomycin (VANCOCIN) 1,250 mg in sodium chloride 0.9 % 250 mL IVPB     1,250 mg 166.7 mL/hr over 90 Minutes Intravenous  Once 07/18/19 0653 07/18/19 2035   07/17/19 1300  vancomycin  (VANCOCIN) IVPB 1000 mg/200 mL premix     1,000 mg 200 mL/hr over 60 Minutes Intravenous  Once 07/17/19 1257 07/18/19 0010   07/17/19 1300  cefTRIAXone (ROCEPHIN) 2 g in sodium chloride 0.9 % 100 mL IVPB     2 g 200 mL/hr over 30 Minutes Intravenous  Once 07/17/19 1257 07/17/19 1648      Objective: Vitals:   07/19/19 1821 07/19/19 1836 07/19/19 1849 07/19/19 2006  BP: (!) 102/52 114/80 (!) 113/53 (!) 101/34  Pulse: 66 65 69 74  Resp: 19 11 18 17   Temp:  (!) 97 F (36.1 C) 98.3 F (36.8 C) 98 F (36.7 C)  TempSrc:   Oral Oral  SpO2: 100% 97% 98% 97%  Weight:      Height:        Intake/Output Summary (Last 24 hours) at 07/19/2019 2305 Last data filed at 07/19/2019 1834 Gross per 24 hour  Intake 785.14 ml  Output 401 ml  Net 384.14 ml   Filed Weights   07/17/19 1219 07/17/19 1541 07/19/19 0500  Weight: 95.7 kg 95.4 kg 90.5 kg    Examination:  Physical Exam:  General: Not in acute distress HEENT: PERRL, EOMI, no scleral icterus, No JVD or bruit Cardiac: S1/S2, RRR, No murmurs, gallops or rubs Pulm: No rales, wheezing, rhonchi or rubs. Abd: Soft, nondistended, nontender, no rebound pain, no organomegaly, BS present Ext: Left great toe missing. Left foot is warm.  Musculoskeletal: No joint deformities, erythema, or stiffness, ROM full Skin: No rashes.  Neuro: Alert and oriented X3, cranial nerves II-XII grossly intact, moves all extremeties normally. Psych: Patient is not psychotic, no suicidal or hemocidal ideation.    Data Reviewed: I have personally reviewed following labs and imaging studies  CBC: Recent Labs  Lab 07/17/19 1359 07/19/19 0755  WBC 10.6* 10.8*  NEUTROABS 8.5*  --   HGB 9.6* 8.0*  HCT 30.4* 24.4*  MCV 94.7 92.1  PLT 361 710   Basic Metabolic Panel: Recent Labs  Lab 07/17/19 1359 07/18/19 0857 07/19/19 0755  NA 135  --  135  K 4.7 4.7 4.5  CL 98  --  93*  CO2 22  --  23  GLUCOSE 128*  --  116*  BUN 47*  --  49*  CREATININE  7.24*  --  7.10*  CALCIUM 8.1*  --  7.3*   GFR: Estimated Creatinine Clearance: 12.3 mL/min (A) (by C-G formula based on SCr of 7.1 mg/dL (H)). Liver Function Tests: No results for input(s): AST, ALT, ALKPHOS, BILITOT, PROT, ALBUMIN in the last 168 hours. No results for input(s): LIPASE, AMYLASE in the last 168 hours. No results for input(s): AMMONIA in the last 168 hours. Coagulation Profile: Recent Labs  Lab 07/17/19 1359  INR 1.2   Cardiac Enzymes: Recent Labs  Lab 07/18/19 0522  CKTOTAL 88   BNP (last 3 results) No results for input(s): PROBNP in the last 8760 hours. HbA1C: Recent Labs    07/17/19 1359  HGBA1C 6.0*   CBG: Recent Labs  Lab 07/19/19 0642 07/19/19 1131 07/19/19 1557 07/19/19 1806 07/19/19 2211  GLUCAP 99 148* 110* 87 192*   Lipid Profile: No results for input(s): CHOL, HDL, LDLCALC, TRIG, CHOLHDL, LDLDIRECT in the last 72 hours. Thyroid Function Tests: No results for input(s): TSH, T4TOTAL, FREET4, T3FREE, THYROIDAB in the last 72 hours. Anemia Panel: No results for input(s): VITAMINB12, FOLATE, FERRITIN, TIBC, IRON, RETICCTPCT in the last 72 hours. Sepsis Labs: Recent Labs  Lab 07/17/19 1530 07/17/19 1823  LATICACIDVEN 1.3 1.4    Recent Results (from the past 240 hour(s))  SARS CORONAVIRUS 2 (TAT 6-24 HRS) Nasopharyngeal Nasopharyngeal Swab     Status: None   Collection Time: 07/14/19  1:11 PM   Specimen: Nasopharyngeal Swab  Result Value Ref Range Status   SARS Coronavirus 2 NEGATIVE NEGATIVE Final    Comment: (NOTE) SARS-CoV-2 target nucleic acids are NOT DETECTED. The SARS-CoV-2 RNA is generally detectable in upper and lower respiratory specimens during the acute phase of infection. Negative results do not preclude SARS-CoV-2 infection, do not rule out co-infections with other pathogens, and should not be used as the sole basis for treatment or other patient management decisions. Negative results must be combined with clinical  observations, patient history, and epidemiological information. The expected result is Negative. Fact Sheet for Patients: SugarRoll.be Fact Sheet for Healthcare Providers: https://www.woods-mathews.com/ This test is not yet approved or cleared by the [place] FDA and  has been authorized for detection and/or diagnosis of SARS-CoV-2 by FDA under an Emergency Use Authorization (EUA). This  EUA will remain  in effect (meaning this test can be used) for the duration of the COVID-19 declaration under Section 56 4(b)(1) of the Act, 21 U.S.C. section 360bbb-3(b)(1), unless the authorization is terminated or revoked sooner. Performed at [facility], [place] [address] [place], [place] [address]   Surgical PCR screen     Status: None   Collection Time: 07/18/19  6:35 AM   Specimen: Nasal Mucosa; Nasal Swab  Result Value Ref Range Status   MRSA, PCR NEGATIVE NEGATIVE Final   Staphylococcus aureus NEGATIVE NEGATIVE Final    Comment: (NOTE) The Xpert SA Assay (FDA approved for NASAL specimens in patients 48 years of age and older), is one component of a comprehensive surveillance program. It is not intended to diagnose infection nor to guide or monitor treatment. Performed at [facility], [address] [place], [place] [address]   Aerobic/Anaerobic Culture (surgical/deep wound)     Status: None (Preliminary result)   Collection Time: 07/19/19  5:11 PM   Specimen: Wound; Tissue  Result Value Ref Range Status   Specimen Description TISSUE  Final   Special Requests LEFT 1ST METATARSAL  Final   Gram Stain   Final    MODERATE WBC PRESENT, PREDOMINANTLY MONONUCLEAR NO ORGANISMS SEEN Performed at [facility], [address], [place], [place] [address]    Culture PENDING  Incomplete   Report Status PENDING  Incomplete  Aerobic/Anaerobic Culture (surgical/deep wound)     Status: None (Preliminary result)   Collection Time:  07/19/19  5:15 PM   Specimen: Wound; Tissue  Result Value Ref Range Status   Specimen Description TISSUE  Final   Special Requests SAMPLE B ,2ND METATARSAL BONE  Final   Gram Stain   Final    RARE WBC PRESENT, PREDOMINANTLY MONONUCLEAR NO ORGANISMS SEEN Performed at [facility], [address], [place], [place] [address]    Culture PENDING  Incomplete   Report Status PENDING  Incomplete     RN Pressure Injury Documentation:       Radiology Studies: No results found.      Scheduled Meds: . atorvastatin  40 mg Oral Daily  . calcitRIOL  0.25 mcg Oral Daily  . calcium acetate  2,001 mg Oral TID WC  . carvedilol  25 mg Oral BID WC  . cholecalciferol  2,000 Units Oral Daily  . feeding supplement (NEPRO CARB STEADY)  237 mL Oral BID BM  . furosemide  80 mg Oral Daily  . gentamicin cream  1 application Topical Daily  . hydrALAZINE  25 mg Oral TID  . insulin aspart  0-9 Units Subcutaneous Q6H  . isosorbide mononitrate  30 mg Oral Daily  . multivitamin  1 tablet Oral Daily  . mupirocin ointment  1 application Nasal BID  . sodium chloride flush  3 mL Intravenous Q12H  . sodium chloride flush  3 mL Intravenous Q12H  . tamsulosin  0.4 mg Oral Daily  . vitamin C  500 mg Oral BID   Continuous Infusions: . sodium chloride    . sodium chloride    . ceFAZolin    . cefTRIAXone (ROCEPHIN)  IV Stopped (07/19/19 1330)  . heparin 1,350 Units/hr (07/19/19 1930)     LOS: 2 days    Time spent: 55 min     Ivor Costa, DO [facility] PAGER is on [place]  If 7PM-7AM, please contact night-coverage [email or web address] Password [email or web address] 07/19/2019, 11:05 PM

## 2019-07-20 ENCOUNTER — Other Ambulatory Visit: Payer: Self-pay

## 2019-07-20 ENCOUNTER — Encounter: Payer: Self-pay | Admitting: Podiatry

## 2019-07-20 DIAGNOSIS — Z95820 Peripheral vascular angioplasty status with implants and grafts: Secondary | ICD-10-CM

## 2019-07-20 DIAGNOSIS — E1152 Type 2 diabetes mellitus with diabetic peripheral angiopathy with gangrene: Secondary | ICD-10-CM

## 2019-07-20 DIAGNOSIS — M869 Osteomyelitis, unspecified: Secondary | ICD-10-CM | POA: Diagnosis not present

## 2019-07-20 DIAGNOSIS — Z89412 Acquired absence of left great toe: Secondary | ICD-10-CM

## 2019-07-20 DIAGNOSIS — I96 Gangrene, not elsewhere classified: Secondary | ICD-10-CM

## 2019-07-20 DIAGNOSIS — Z7902 Long term (current) use of antithrombotics/antiplatelets: Secondary | ICD-10-CM

## 2019-07-20 DIAGNOSIS — Z992 Dependence on renal dialysis: Secondary | ICD-10-CM

## 2019-07-20 DIAGNOSIS — I70299 Other atherosclerosis of native arteries of extremities, unspecified extremity: Secondary | ICD-10-CM

## 2019-07-20 DIAGNOSIS — Z79899 Other long term (current) drug therapy: Secondary | ICD-10-CM

## 2019-07-20 DIAGNOSIS — N186 End stage renal disease: Secondary | ICD-10-CM | POA: Diagnosis not present

## 2019-07-20 DIAGNOSIS — E785 Hyperlipidemia, unspecified: Secondary | ICD-10-CM

## 2019-07-20 DIAGNOSIS — E1169 Type 2 diabetes mellitus with other specified complication: Secondary | ICD-10-CM | POA: Diagnosis not present

## 2019-07-20 DIAGNOSIS — Z89422 Acquired absence of other left toe(s): Secondary | ICD-10-CM

## 2019-07-20 DIAGNOSIS — R3129 Other microscopic hematuria: Secondary | ICD-10-CM

## 2019-07-20 DIAGNOSIS — Z794 Long term (current) use of insulin: Secondary | ICD-10-CM

## 2019-07-20 DIAGNOSIS — E1122 Type 2 diabetes mellitus with diabetic chronic kidney disease: Secondary | ICD-10-CM

## 2019-07-20 DIAGNOSIS — I2510 Atherosclerotic heart disease of native coronary artery without angina pectoris: Secondary | ICD-10-CM

## 2019-07-20 DIAGNOSIS — Z87891 Personal history of nicotine dependence: Secondary | ICD-10-CM

## 2019-07-20 DIAGNOSIS — L97909 Non-pressure chronic ulcer of unspecified part of unspecified lower leg with unspecified severity: Secondary | ICD-10-CM

## 2019-07-20 DIAGNOSIS — Z955 Presence of coronary angioplasty implant and graft: Secondary | ICD-10-CM

## 2019-07-20 DIAGNOSIS — I739 Peripheral vascular disease, unspecified: Secondary | ICD-10-CM | POA: Diagnosis not present

## 2019-07-20 LAB — CBC
HCT: 25.7 % — ABNORMAL LOW (ref 39.0–52.0)
HCT: 23.8 % — ABNORMAL LOW (ref 39.0–52.0)
Hemoglobin: 8.1 g/dL — ABNORMAL LOW (ref 13.0–17.0)
Hemoglobin: 7.5 g/dL — ABNORMAL LOW (ref 13.0–17.0)
MCH: 29.9 pg (ref 26.0–34.0)
MCH: 29.9 pg (ref 26.0–34.0)
MCHC: 31.5 g/dL (ref 30.0–36.0)
MCHC: 31.5 g/dL (ref 30.0–36.0)
MCV: 94.8 fL (ref 80.0–100.0)
MCV: 94.8 fL (ref 80.0–100.0)
Platelets: 318 10*3/uL (ref 150–400)
Platelets: 292 10*3/uL (ref 150–400)
RBC: 2.71 MIL/uL — ABNORMAL LOW (ref 4.22–5.81)
RBC: 2.51 MIL/uL — ABNORMAL LOW (ref 4.22–5.81)
RDW: 14.1 % (ref 11.5–15.5)
RDW: 14.2 % (ref 11.5–15.5)
WBC: 12.3 10*3/uL — ABNORMAL HIGH (ref 4.0–10.5)
WBC: 11 10*3/uL — ABNORMAL HIGH (ref 4.0–10.5)
nRBC: 0 % (ref 0.0–0.2)
nRBC: 0 % (ref 0.0–0.2)

## 2019-07-20 LAB — GLUCOSE, CAPILLARY
Glucose-Capillary: 143 mg/dL — ABNORMAL HIGH (ref 70–99)
Glucose-Capillary: 146 mg/dL — ABNORMAL HIGH (ref 70–99)
Glucose-Capillary: 207 mg/dL — ABNORMAL HIGH (ref 70–99)
Glucose-Capillary: 222 mg/dL — ABNORMAL HIGH (ref 70–99)
Glucose-Capillary: 223 mg/dL — ABNORMAL HIGH (ref 70–99)

## 2019-07-20 LAB — BASIC METABOLIC PANEL
Anion gap: 13 (ref 5–15)
BUN: 47 mg/dL — ABNORMAL HIGH (ref 6–20)
CO2: 23 mmol/L (ref 22–32)
Calcium: 7.1 mg/dL — ABNORMAL LOW (ref 8.9–10.3)
Chloride: 99 mmol/L (ref 98–111)
Creatinine, Ser: 7.17 mg/dL — ABNORMAL HIGH (ref 0.61–1.24)
GFR calc Af Amer: 9 mL/min — ABNORMAL LOW (ref >60)
GFR calc non Af Amer: 8 mL/min — ABNORMAL LOW (ref >60)
Glucose, Bld: 204 mg/dL — ABNORMAL HIGH (ref 70–99)
Potassium: 4.4 mmol/L (ref 3.5–5.1)
Sodium: 135 mmol/L (ref 135–145)

## 2019-07-20 LAB — VANCOMYCIN, RANDOM: Vancomycin Rm: 18

## 2019-07-20 LAB — HEPARIN LEVEL (UNFRACTIONATED)
Heparin Unfractionated: <0.1 IU/mL — ABNORMAL LOW (ref 0.30–0.70)
Heparin Unfractionated: <0.1 IU/mL — ABNORMAL LOW (ref 0.30–0.70)

## 2019-07-20 MED ORDER — HEPARIN BOLUS VIA INFUSION
2700.0000 [IU] | Freq: Once | INTRAVENOUS | Status: AC
  Administered 2019-07-20: 2700 [IU] via INTRAVENOUS
  Filled 2019-07-20: qty 2700

## 2019-07-20 MED ORDER — EPOETIN ALFA 10000 UNIT/ML IJ SOLN: 20000.0000 [IU] | Freq: Once | INTRAMUSCULAR | Status: DC

## 2019-07-20 MED ORDER — HEPARIN SODIUM (PORCINE) 5000 UNIT/ML IJ SOLN
5000.0000 [IU] | Freq: Three times a day (TID) | INTRAMUSCULAR | Status: DC
  Administered 2019-07-20 – 2019-07-21 (×3): 5000 [IU] via SUBCUTANEOUS
  Filled 2019-07-20 (×3): qty 1

## 2019-07-20 MED ORDER — VANCOMYCIN HCL IN DEXTROSE 1-5 GM/200ML-% IV SOLN
1000.0000 mg | Freq: Once | INTRAVENOUS | Status: AC
  Administered 2019-07-20: 1000 mg via INTRAVENOUS
  Filled 2019-07-20: qty 200

## 2019-07-20 MED ORDER — HEPARIN (PORCINE) 25000 UT/250ML-% IV SOLN: 1750.0000 [IU]/h | INTRAVENOUS | Status: DC

## 2019-07-20 MED ORDER — DELFLEX-LC/1.5% DEXTROSE 344 MOSM/L IP SOLN
INTRAPERITONEAL | Status: DC
  Filled 2019-07-20: qty 3000

## 2019-07-20 MED ORDER — SODIUM CHLORIDE 0.9 % IV SOLN
1.0000 g | INTRAVENOUS | Status: DC
  Administered 2019-07-21: 1 g via INTRAVENOUS
  Filled 2019-07-20 (×2): qty 1

## 2019-07-20 MED ORDER — INSULIN ASPART 100 UNIT/ML ~~LOC~~ SOLN
0.0000 [IU] | Freq: Three times a day (TID) | SUBCUTANEOUS | Status: DC
  Administered 2019-07-20 – 2019-07-21 (×3): 1 [IU] via SUBCUTANEOUS
  Filled 2019-07-20 (×3): qty 1

## 2019-07-20 MED ORDER — CLOPIDOGREL BISULFATE 75 MG PO TABS
300.0000 mg | ORAL_TABLET | ORAL | Status: AC
  Administered 2019-07-20: 300 mg via ORAL
  Filled 2019-07-20: qty 4

## 2019-07-20 MED ORDER — VANCOMYCIN VARIABLE DOSE PER UNSTABLE RENAL FUNCTION (PHARMACIST DOSING): Status: DC

## 2019-07-20 MED ORDER — CLOPIDOGREL BISULFATE 75 MG PO TABS
75.0000 mg | ORAL_TABLET | Freq: Every day | ORAL | Status: DC
  Administered 2019-07-21: 75 mg via ORAL
  Filled 2019-07-20: qty 1

## 2019-07-20 NOTE — Consult Note (Signed)
ANTICOAGULATION CONSULT NOTE  Pharmacy Consult for heparin drip Indication: VTE Treatment   Patient Measurements: Height: 5\' 9"  (175.3 cm) Weight: 219 lb 9.3 oz (99.6 kg) IBW/kg (Calculated) : 70.7 Heparin Dosing Weight: 90.5 kg  Vital Signs: Temp: 97.5 F (36.4 C) (11/19 1132) Temp Source: Oral (11/19 1132) BP: 96/54 (11/19 1132) Pulse Rate: 70 (11/19 1132)  Labs: Recent Labs    07/17/19 1359 07/18/19 0522  07/19/19 0755 07/20/19 0142 07/20/19 0557 07/20/19 1111  HGB 9.6*  --   --  8.0* 8.1* 7.5*  --   HCT 30.4*  --   --  24.4* 25.7* 23.8*  --   PLT 361  --   --  285 318 292  --   LABPROT 14.7  --   --   --   --   --   --   INR 1.2  --   --   --   --   --   --   HEPARINUNFRC  --   --    < > <0.10* <0.10*  --  <0.10*  CREATININE 7.24*  --   --  7.10*  --  7.17*  --   CKTOTAL  --  88  --   --   --   --   --    < > = values in this interval not displayed.    Estimated Creatinine Clearance: 12.8 mL/min (A) (by C-G formula based on SCr of 7.17 mg/dL (H)).   Medical History: Past Medical History:  Diagnosis Date  . Acute respiratory failure with hypoxia ([place]) 09/17/2018  . Arthritis    "left arm; right leg" (12/13/2014)  . Asthma   . CHF (congestive heart failure) ([place])   . Chronic disease anemia    [name] 12/13/2014  . Chronic kidney disease (CKD), stage IV (severe) ([place])    [name] 12/13/2014... on dialysis  . Complication of anesthesia    unable to urinate after CAPD urgery  . Continuous ambulatory peritoneal dialysis status ([place])   . Coronary artery disease    [name] 12/13/2014  . Depression   . Dysrhythmia    patient unaware of irregular heartbeat  . GERD (gastroesophageal reflux disease)   . High cholesterol    [name] 12/13/2014  . Hypertension   . Non-Q wave myocardial infarction ([place])    [name] 12/13/2014  . PVD (peripheral vascular disease) ([place])    [name] 12/13/2014  . Type II diabetes mellitus (HCC)     Medications:  Scheduled:  . atorvastatin   40 mg Oral Daily  . calcitRIOL  0.25 mcg Oral Daily  . calcium acetate  2,001 mg Oral TID WC  . carvedilol  25 mg Oral BID WC  . cholecalciferol  2,000 Units Oral Daily  . feeding supplement (NEPRO CARB STEADY)  237 mL Oral BID BM  . furosemide  80 mg Oral Daily  . gentamicin cream  1 application Topical Daily  . heparin  2,700 Units Intravenous Once  . hydrALAZINE  25 mg Oral TID  . insulin aspart  0-9 Units Subcutaneous Q6H  . isosorbide mononitrate  30 mg Oral Daily  . multivitamin  1 tablet Oral Daily  . mupirocin ointment  1 application Nasal BID  . sodium chloride flush  3 mL Intravenous Q12H  . sodium chloride flush  3 mL Intravenous Q12H  . tamsulosin  0.4 mg Oral Daily  . vancomycin variable dose per unstable renal function (pharmacist dosing)   Does not apply See admin  instructions  . vitamin C  500 mg Oral BID    Assessment: 58 y.o. male with medical history significant of  Rt toe pain with extensive and severe PAD history. Today Dr [name] performed an angiogram and is recommending heparin with 1/2 of the nomogram initial dose then per nomogram. He is on no chronic anticoagulation PTA. Baseline labs:  INR 1.2, Hgb 9.6, PLT wnl  Heparin was stopped earlier today for a procedure. Last HL was <0.10 @0755  today. Heparin rate was not changed was a result of this level not being therapeutic. Of note, heparin was stopped at 1215 today.   11/19 @ 0130 HL < 0.10 subtherapeutic. Per RN drip was stopped for roughly 5 minutes for line change, was also stopped during the day for angiogram. Will rebolus heparin 2700 units IV x 1 and increase rate to 1500 units/hr  Goal of Therapy:   Heparin level 0.3-0.7 units/ml Monitor platelets by anticoagulation protocol: Yes   Plan:  11/19 @ 1111 HL < 0.10 subtherapeutic again. Verified with RN that drip is running. Will rebolus heparin 2700 units IV x 1 and increase rate to 1750 units/hr and will recheck HL in 8 hrs, CBC trending down will  continue to monitor.  Ijeoma Loor A, PharmD 07/20/2019,1:17 PM

## 2019-07-20 NOTE — Progress Notes (Signed)
   07/20/19 2204  Neurological  Level of Consciousness Alert  Orientation Level Oriented X4  Respiratory  Respiratory Pattern Regular;Unlabored  Bilateral Breath Sounds Clear  Cardiac  Pulse Regular  Heart Sounds S1, S2  Peritoneal Catheter Left lower abdomen Continuous ambulatory  Placement Date/Time: 04/07/17 1328   Procedural Verification: Medical records & consent reviewed  Time out: Correct Patient;Correct Site;Correct Procedure;Special equipment/requirements available  Person Inserting Catheter: Dr. [name]  Catheter Locat...  Site Assessment Clean;Dry;Intact  Drainage Description None  Catheter status Accessed;Draining;Patent  Dressing Gauze/Drain sponge  Dressing Status Clean;Dry;Intact  Dressing Intervention Dressing changed  Psychosocial  Psychosocial (WDL) WDL  PT STABLE FOR PD TX NO C/OS NO DISTRESS NOTED. CATHETER WDL TX INITIATED AS ORDERED

## 2019-07-20 NOTE — Evaluation (Signed)
Physical Therapy Evaluation Patient Details Name: Scott [name] MRN: [id number] DOB: 06/19/1961 Today's Date: 07/20/2019   History of Present Illness  Tavone Caesar is a 58yoM who [facility] with [facility] foot pain.Pt is s/p left partial 1st ray amputation (sept 2020) after worsening left great toe pain, warmness and gangrene for 1 week. PMH include CHF, CKD, CAD, PVD, HTN, DM, continuous ambulatory peritoneal dialysis status  Clinical Impression  Pt admitted with above diagnosis. Pt currently with functional limitations due to the deficits listed below (see "PT Problem List"). Upon entry, pt seated EOB eating [employer] meal, awake and agreeable to participate. The pt is alert and oriented x4, pleasant, conversational, and generally a good historian. Pt able to perform STS transfers with RW and heavy BUE use/effort, multiple false starts. Once standing, pt appears stable with RW. Pt able to AMB to door and back but unable to demonstrate any NWB AMB d/t weakness. Functional mobility assessment demonstrates increased effort/time requirements, poor tolerance, and need for physical assistance, whereas the patient performed these at a higher level of independence PTA. Pt is aware of weight bearing status and need to follow for best potential outcome, but is physically limited. Pt will benefit from skilled PT intervention to increase independence and safety with basic mobility in preparation for discharge to the venue listed below.        Follow Up Recommendations Home health PT    Equipment Recommendations  Rolling walker with 5" wheels    Recommendations for Other Services       Precautions / Restrictions Precautions Precautions: Fall Restrictions Weight Bearing Restrictions: Yes LLE Weight Bearing: Non weight bearing Other Position/Activity Restrictions: Podiatry allowing for heel weightbearing as needed for mobility to BR.      Mobility  Bed Mobility               General bed  mobility comments: Recevied sitting at EOB eating [employer].  Transfers Overall transfer level: Needs assistance Equipment used: Rolling walker (2 wheeled) Transfers: Sit to/from Stand Sit to Stand: Min guard         General transfer comment: struggles to safely rise from EOB with use of hands, both in effort and problem solving. Maintains foot on ground throughout  Ambulation/Gait Ambulation/Gait assistance: Supervision Gait Distance (Feet): 27 Feet Assistive device: Rolling walker (2 wheeled) Gait Pattern/deviations: Step-to pattern     General Gait Details: 3-point step-to gait; pt cued to perform 2-point hop-to gait with RW back to EOB c LLE maintained in NWB. Pt denies attempt, reports he has not the strength requires for such task.  Stairs            Wheelchair Mobility    Modified Rankin (Stroke Patients Only)       Balance Overall balance assessment: Modified Independent;History of Falls;Mild deficits observed, not formally tested                                           Pertinent Vitals/Pain Pain Assessment: No/denies pain    Home Living Family/patient expects to be discharged to:: Private residence Living Arrangements: Spouse/significant other;Children(30yo Son)   Type of Home: Mobile home Home Access: Stairs to enter;Ramped entrance Entrance Stairs-Rails: Can reach both Entrance Stairs-Number of Steps: 3-4 Home Layout: One level Home Equipment: Grab bars - tub/shower;Cane - single point;Wheelchair - [employer] - 2 wheels      Prior  Function Level of Independence: Independent               Hand Dominance        Extremity/Trunk Assessment   Upper Extremity Assessment Upper Extremity Assessment: Generalized weakness    Lower Extremity Assessment Lower Extremity Assessment: Generalized weakness       Communication   Communication: No difficulties  Cognition Arousal/Alertness: Awake/alert Behavior During  Therapy: WFL for tasks assessed/performed Overall Cognitive Status: Within Functional Limits for tasks assessed                                        General Comments      Exercises     Assessment/Plan    PT Assessment Patient needs continued PT services  PT Problem List Decreased strength;Decreased activity tolerance;Decreased mobility;Decreased balance       PT Treatment Interventions DME instruction;Gait training;Balance training;Functional mobility training;Therapeutic activities;Therapeutic exercise;Stair training;Patient/family education    PT Goals (Current goals can be found in the Care Plan section)  Acute Rehab PT Goals Patient Stated Goal: acquire a kneleing sccoter and use this for mobility needs. PT Goal Formulation: With patient Time For Goal Achievement: 08/03/19 Potential to Achieve Goals: Fair    Frequency 7X/week   Barriers to discharge        Co-evaluation               AM-PAC PT "6 Clicks" Mobility  Outcome Measure Help needed turning from your back to your side while in a flat bed without using bedrails?: A Little Help needed moving from lying on your back to sitting on the side of a flat bed without using bedrails?: A Little Help needed moving to and from a bed to a chair (including a wheelchair)?: A Little Help needed standing up from a chair using your arms (e.g., wheelchair or bedside chair)?: A Little Help needed to walk in hospital room?: A Little Help needed climbing 3-5 steps with a railing? : A Little 6 Click Score: 18    End of Session   Activity Tolerance: Patient tolerated treatment well;No increased pain;Patient limited by fatigue Patient left: in bed;with nursing/sitter in room;with call bell/phone within reach Nurse Communication: Mobility status PT Visit Diagnosis: Unsteadiness on feet (R26.81);Muscle weakness (generalized) (M62.81);Difficulty in walking, not elsewhere classified (R26.2);Other abnormalities  of gait and mobility (R26.89)    Time: 5188-4166 PT Time Calculation (min) (ACUTE ONLY): 13 min   Charges:   PT Evaluation $PT Eval Moderate Complexity: 1 Mod          3:44 PM, 07/20/19 Etta Grandchild, PT, DPT Physical Therapist - [facility]  [phone] ([place])   Buccola,Allan C 07/20/2019, 3:42 PM

## 2019-07-20 NOTE — Consult Note (Signed)
NAME: Scott [name]  DOB: October 15, 1960  MRN: [id number]  Date/Time: 07/20/2019 6:45 PM  REQUESTING PROVIDER: [name] Subjective:  REASON FOR CONSULT: Osteomyelitis with peripheral vascular disease   History from patient,.  Chart reviewed and also looked at care everywhere and [facility] notes  ? Scott [name] is a 58 y.o. male with a history of end-stage renal disease on peritoneal dialysis, peripheral vascular disease is admitted with worsening left foot infection.  Patient has has had a chronic problem with his left foot secondary to peripheral vascular disease. Between 01/21/2019 till 03/04/2019 he was treated  with IV antibiotics- vancomycin, cefepime, metronidazole -for early acute osteomyelitis at [facility].  He was followed by Dr. Vista Deck at [facility].  He then had left lower extremity balloon angioplasty and stenting of the left SFA/popliteal on 03/08/2019.  On 04/14/2019 he presented to [facility] ED with worsening left toe pain and concern for worsening osteomyelitis and gangrene in his left great toe.  A left lower extremity duplex showed severely dampened left great toe flow compared to previous studies consistent with SFA stent occlusion.  He was taken to the OR for a left lower extremity arteriogram with balloon angioplasty.  On postop day 1 he got left lower extremity PVLs with findings of patent outflow stent and no flow in the left tibioperoneal vessels and left great toe pressure of 0 mmHg.  Podiatry was consulted and recommended outpatient amputation for his left great toe wound and hyperbaric oxygen therapy. Patient came to [facility] ED on 05/22/2019 complaining of gangrenous left toe and also the right great toe becoming gangrenous.  On 05/23/2019 he underwent angio and had atherectomy of the left anterior tibial artery, angioplasty of the left anterior tibial and dorsalis pedis, atherectomy of the left tibioperoneal  trunk by Dr. Franchot Gallo.  Then on 05/24/2019 [name] the podiatrist took him for left partial  first ray amputation.  The wound culture had multiple organisms including Citrobacter, Providencia , Pseudomonas.  After receiving IV antibiotics in the hospital he was discharged home on 05/26/2019 on Augmentin.  He was then followed by both the podiatrist and the wound clinic.  He had another culture sent from the wound on 06/19/2019 and that was Pseudomonas and Citrobacter.  He was prescribed ciprofloxacin in the wound clinic for 7 days.  As the tissue on the left great toe amputation site was not healing he came back to the ED on 07/17/2019.  He has been started on vancomycin and ceftriaxone.  He underwent angio on 07/18/2019 by Dr. Franchot Gallo and it was found that there was a foreign body with a portion of the CX 1 catheter retained from previous angiogram and this was removed.  He also had a stent placement in the left anterior tibial artery.  He underwent thromboembolectomy of  left anterior tibial artery. On 07/19/2019 he was taken for surgery by podiatrist and underwent incision with debridement of the infected bone and soft tissue of the left first metatarsal.  And amputation of left second toe with partial ray resection.  Wound cultures were sent from both.  I am asked to see the patient for the same. Patient does not have any fever or chills or nausea vomiting abdominal pain or cough or shortness of breath Past Medical History:  Diagnosis Date  . Acute respiratory failure with hypoxia ([place]) 09/17/2018  . Arthritis    "left arm; right leg" (12/13/2014)  . Asthma   . CHF (congestive heart failure) ([place])   . Chronic  disease anemia    [name] 12/13/2014  . Chronic kidney disease (CKD), stage IV (severe) ([place])    [name] 12/13/2014... on dialysis  . Complication of anesthesia    unable to urinate after CAPD urgery  . Continuous ambulatory peritoneal dialysis status ([place])   . Coronary artery disease    [name] 12/13/2014  . Depression   . Dysrhythmia    patient unaware of irregular heartbeat  . GERD  (gastroesophageal reflux disease)   . High cholesterol    [name] 12/13/2014  . Hypertension   . Non-Q wave myocardial infarction ([place])    [name] 12/13/2014  . PVD (peripheral vascular disease) ([place])    [name] 12/13/2014  . Type II diabetes mellitus ([place])     Past Surgical History:  Procedure Laterality Date  . AMPUTATION TOE Left 05/24/2019   Procedure: Left great toe amputation;  Surgeon: Caroline More, DPM;  Location: [facility] ORS;  Service: Podiatry;  Laterality: Left;  . AMPUTATION TOE Left 07/19/2019   Procedure: AMPUTATION 2nd TOE PARTIAL RAY RESECTION;  Surgeon: Sharlotte Alamo, DPM;  Location: [facility] ORS;  Service: Podiatry;  Laterality: Left;  . AV FISTULA PLACEMENT Right 04/07/2017   Procedure: ARTERIOVENOUS (AV) FISTULA CREATION ( BRACHIALCEPHALIC );  Surgeon: Katha Cabal, MD;  Location: [facility] ORS;  Service: Vascular;  Laterality: Right;  . CAPD INSERTION N/A 04/07/2017   Procedure: LAPAROSCOPIC INSERTION CONTINUOUS AMBULATORY PERITONEAL DIALYSIS  (CAPD) CATHETER;  Surgeon: Katha Cabal, MD;  Location: [facility] ORS;  Service: Vascular;  Laterality: N/A;  . CARDIAC CATHETERIZATION  11/2014  . COLONOSCOPY WITH PROPOFOL N/A 12/01/2017   Procedure: COLONOSCOPY WITH PROPOFOL;  Surgeon: Lin Landsman, MD;  Location: [place];  Service: Endoscopy;  Laterality: N/A;  Diabetic - insulin  . COLONOSCOPY WITH PROPOFOL N/A 12/29/2017   Procedure: COLONOSCOPY WITH PROPOFOL;  Surgeon: Lin Landsman, MD;  Location: [place];  Service: Endoscopy;  Laterality: N/A;  . DIALYSIS/PERMA CATHETER INSERTION N/A 01/18/2017   Procedure: Dialysis/Perma Catheter Insertion;  Surgeon: Algernon Huxley, MD;  Location: [place] [facility];  Service: Cardiovascular;  Laterality: N/A;  . DIALYSIS/PERMA CATHETER REMOVAL N/A 07/26/2017   Procedure: DIALYSIS/PERMA CATHETER REMOVAL;  Surgeon: Algernon Huxley, MD;  Location: [place] [facility];  Service: Cardiovascular;  Laterality: N/A;  . INCISION  AND DRAINAGE OF WOUND Right ~ 10/2014   "4th toe foot"  . IRRIGATION AND DEBRIDEMENT FOOT Left 07/19/2019   Procedure: IRRIGATION AND DEBRIDEMENT FOOT;  Surgeon: Sharlotte Alamo, DPM;  Location: [facility];  Service: Podiatry;  Laterality: Left;  . LOWER EXTREMITY ANGIOGRAPHY Left 05/23/2019   Procedure: Lower Extremity Angiography;  Surgeon: Katha Cabal, MD;  Location: [place] [facility];  Service: Cardiovascular;  Laterality: Left;  . LOWER EXTREMITY ANGIOGRAPHY Left 07/18/2019   Procedure: LOWER EXTREMITY ANGIOGRAPHY;  Surgeon: Katha Cabal, MD;  Location: [place] [facility];  Service: Cardiovascular;  Laterality: Left;  . PERCUTANEOUS CORONARY STENT INTERVENTION (PCI-S) N/A 12/14/2014   Procedure: PERCUTANEOUS CORONARY STENT INTERVENTION (PCI-S);  Surgeon: Charolette Forward, MD;  Location: [facility] CATH LAB;  Service: Cardiovascular;  Laterality: N/A;  . PERCUTANEOUS CORONARY STENT INTERVENTION (PCI-S) N/A 12/18/2014   Procedure: PERCUTANEOUS CORONARY STENT INTERVENTION (PCI-S);  Surgeon: Charolette Forward, MD;  Location: [facility] CATH LAB;  Service: Cardiovascular;  Laterality: N/A;  . POLYPECTOMY  12/01/2017   Procedure: POLYPECTOMY;  Surgeon: Lin Landsman, MD;  Location: [place];  Service: Endoscopy;;  . POLYPECTOMY  12/29/2017   Procedure: POLYPECTOMY;  Surgeon:  Lin Landsman, MD;  Location: [place];  Service: Endoscopy;;  . TOE AMPUTATION Right 2015   4th toe    Social History   Socioeconomic History  . Marital status: Significant Other    Spouse name: Not on file  . Number of children: Not on file  . Years of education: Not on file  . Highest education level: Not on file  Occupational History  . Not on file  Social Needs  . Financial resource strain: Not on file  . Food insecurity    Worry: Not on file    Inability: Not on file  . Transportation needs    Medical: Not on file    Non-medical: Not on file  Tobacco Use  . Smoking status: Former Smoker     Packs/day: 1.00    Years: 30.00    Pack years: 30.00    Types: Cigarettes    Start date: 08/31/1984    Quit date: 11/30/2014    Years since quitting: 4.6  . Smokeless tobacco: Never Used  Substance and Sexual Activity  . Alcohol use: Not Currently  . Drug use: No  . Sexual activity: Not Currently  Lifestyle  . Physical activity    Days per week: Not on file    Minutes per session: Not on file  . Stress: Not on file  Relationships  . Social Herbalist on phone: Not on file    Gets together: Not on file    Attends religious service: Not on file    Active member of club or organization: Not on file    Attends meetings of clubs or organizations: Not on file    Relationship status: Not on file  . Intimate partner violence    Fear of current or ex partner: Not on file    Emotionally abused: Not on file    Physically abused: Not on file    Forced sexual activity: Not on file  Other Topics Concern  . Not on file  Social History Narrative   Lives at home with girlfriend and son    Family History  Problem Relation Age of Onset  . Cancer Mother        Lung  . Cancer Father        Lung  . Diabetes Sister   . Diabetes Brother   . CAD Brother   . Hernia Son   . Cancer Brother    No Known Allergies  ? Current Facility-Administered Medications  Medication Dose Route Frequency Provider Last Rate Last Dose  . 0.9 %  sodium chloride infusion  250 mL Intravenous PRN Sharlotte Alamo, DPM      . 0.9 %  sodium chloride infusion  250 mL Intravenous PRN Sharlotte Alamo, DPM      . albuterol (PROVENTIL) (2.5 MG/3ML) 0.083% nebulizer solution 2.5 mg  2.5 mg Inhalation Q6H PRN Sharlotte Alamo, DPM      . alum & mag hydroxide-simeth (MAALOX/MYLANTA) 200-200-20 MG/5ML suspension 30 mL  30 mL Oral Q4H PRN Sharlotte Alamo, DPM   30 mL at 07/20/19 0827  . atorvastatin (LIPITOR) tablet 40 mg  40 mg Oral Daily Sharlotte Alamo, DPM   40 mg at 07/20/19 0803  . calcitRIOL (ROCALTROL) capsule 0.25 mcg  0.25  mcg Oral Daily Sharlotte Alamo, DPM   0.25 mcg at 07/20/19 0800  . calcium acetate (PHOSLO) capsule 2,001 mg  2,001 mg Oral TID WC Sharlotte Alamo, DPM   2,001 mg at 07/20/19  1631  . calcium acetate (PHOSLO) capsule 212-2,482 mg  500-3,704 mg Oral TID BM PRN Sharlotte Alamo, DPM      . carvedilol (COREG) tablet 25 mg  25 mg Oral BID WC Sharlotte Alamo, DPM   25 mg at 07/20/19 0802  . cefTRIAXone (ROCEPHIN) 2 g in sodium chloride 0.9 % 100 mL IVPB  2 g Intravenous Q24H Sharlotte Alamo, DPM   Stopped at 07/20/19 1349  . cholecalciferol (VITAMIN D3) tablet 2,000 Units  2,000 Units Oral Daily Sharlotte Alamo, DPM   2,000 Units at 07/20/19 8889  . [START ON 07/21/2019] clopidogrel (PLAVIX) tablet 75 mg  75 mg Oral Daily Schnier, [name], MD      . dialysis solution 1.5% low-MG/low-CA dianeal solution   Intraperitoneal Q24H Candiss Norse, [place], MD      . epoetin alfa (EPOGEN) injection 20,000 Units  20,000 Units Subcutaneous Once Murlean Iba, MD      . feeding supplement (NEPRO CARB STEADY) liquid 237 mL  237 mL Oral BID BM Sharlotte Alamo, DPM   237 mL at 07/20/19 1150  . furosemide (LASIX) tablet 80 mg  80 mg Oral Daily Sharlotte Alamo, DPM   80 mg at 07/20/19 0801  . gentamicin cream (GARAMYCIN) 0.1 % 1 application  1 application Topical Daily Sharlotte Alamo, DPM   1 application at 16/94/50 0800  . heparin injection 5,000 Units  5,000 Units Subcutaneous Q8H Ivor Costa, MD      . heparin injection 500 Units  500 Units Intraperitoneal PRN Sharlotte Alamo, DPM      . hydrALAZINE (APRESOLINE) tablet 25 mg  25 mg Oral TID Sharlotte Alamo, DPM   25 mg at 07/17/19 1818  . HYDROcodone-acetaminophen (NORCO/VICODIN) 5-325 MG per tablet 1 tablet  1 tablet Oral Q6H PRN Sharlotte Alamo, DPM   1 tablet at 07/17/19 1819  . insulin aspart (novoLOG) injection 0-9 Units  0-9 Units Subcutaneous TID AC & HS Ivor Costa, MD   1 Units at 07/20/19 1635  . isosorbide mononitrate (IMDUR) 24 hr tablet 30 mg  30 mg Oral Daily Sharlotte Alamo, DPM   30 mg at 07/20/19 0802  .  morphine 2 MG/ML injection 2 mg  2 mg Intravenous Q4H PRN Sharlotte Alamo, DPM   2 mg at 07/17/19 2126  . multivitamin (RENA-VIT) tablet 1 tablet  1 tablet Oral Daily Sharlotte Alamo, DPM   1 tablet at 07/20/19 3888  . mupirocin ointment (BACTROBAN) 2 % 1 application  1 application Nasal BID Sharlotte Alamo, DPM   1 application at 28/00/34 0800  . nitroGLYCERIN (NITROSTAT) SL tablet 0.4 mg  0.4 mg Sublingual Q5 min PRN Sharlotte Alamo, DPM      . ondansetron [facility]) injection 4 mg  4 mg Intravenous Q6H PRN Sharlotte Alamo, DPM   4 mg at 07/18/19 9179  . sodium chloride flush (NS) 0.9 % injection 3 mL  3 mL Intravenous Q12H Sharlotte Alamo, DPM   3 mL at 07/19/19 2029  . sodium chloride flush (NS) 0.9 % injection 3 mL  3 mL Intravenous PRN Sharlotte Alamo, DPM      . sodium chloride flush (NS) 0.9 % injection 3 mL  3 mL Intravenous Q12H Sharlotte Alamo, DPM   3 mL at 07/20/19 0805  . sodium chloride flush (NS) 0.9 % injection 3 mL  3 mL Intravenous PRN Sharlotte Alamo, DPM      . tamsulosin (FLOMAX) capsule 0.4 mg  0.4 mg Oral Daily Sharlotte Alamo, DPM   0.4 mg at  07/20/19 0802  . vancomycin variable dose per unstable renal function (pharmacist dosing)   Does not apply See admin instructions Sharlotte Alamo, DPM      . vitamin C (ASCORBIC ACID) tablet 500 mg  500 mg Oral BID Sharlotte Alamo, DPM   500 mg at 07/20/19 0803   Facility-Administered Medications Ordered in Other Encounters  Medication Dose Route Frequency Provider Last Rate Last Dose  . ceFAZolin (ANCEF) IVPB 2g/100 mL premix  2 g Intravenous 30 min Pre-Op Algernon Huxley, MD         Abtx:  Anti-infectives (From admission, onward)   Start     Dose/Rate Route Frequency Ordered Stop   07/20/19 0921  vancomycin variable dose per unstable renal function (pharmacist dosing)      Does not apply See admin instructions 07/20/19 0921     07/20/19 0715  vancomycin (VANCOCIN) IVPB 1000 mg/200 mL premix     1,000 mg 200 mL/hr over 60 Minutes Intravenous  Once 07/20/19 0707 07/20/19 0907    07/19/19 1558  ceFAZolin (ANCEF) 1-4 GM/50ML-% IVPB    Note to Pharmacy: Sylvester Harder   : cabinet override      07/19/19 1558 07/20/19 0414   07/18/19 1400  cefTRIAXone (ROCEPHIN) 2 g in sodium chloride 0.9 % 100 mL IVPB     2 g 200 mL/hr over 30 Minutes Intravenous Every 24 hours 07/18/19 0000     07/18/19 0845  ceFAZolin (ANCEF) IVPB 1 g/50 mL premix     1 g 100 mL/hr over 30 Minutes Intravenous  Once 07/18/19 0833 07/18/19 1044   07/18/19 0700  vancomycin (VANCOCIN) 1,250 mg in sodium chloride 0.9 % 250 mL IVPB     1,250 mg 166.7 mL/hr over 90 Minutes Intravenous  Once 07/18/19 0653 07/18/19 2035   07/17/19 1300  vancomycin (VANCOCIN) IVPB 1000 mg/200 mL premix     1,000 mg 200 mL/hr over 60 Minutes Intravenous  Once 07/17/19 1257 07/18/19 0010   07/17/19 1300  cefTRIAXone (ROCEPHIN) 2 g in sodium chloride 0.9 % 100 mL IVPB     2 g 200 mL/hr over 30 Minutes Intravenous  Once 07/17/19 1257 07/17/19 1648      REVIEW OF SYSTEMS:  Const: negative fever, negative chills, negative weight loss Eyes: negative diplopia or visual changes, negative eye pain ENT: negative coryza, negative sore throat Resp: negative cough, hemoptysis, dyspnea Cards: negative for chest pain, palpitations, lower extremity edema GU: negative for frequency, dysuria and hematuria GI: Negative for abdominal pain, diarrhea, bleeding, constipation Skin: negative for rash and pruritus Heme: negative for easy bruising and gum/nose bleeding MS: negative for myalgias, arthralgias, back pain and muscle weakness Neurolo:negative for headaches, dizziness, vertigo, memory problems  Psych: negative for feelings of anxiety, depression  Endocrine: Has diabetes Allergy/Immunology- negative for any medication or food allergies ? Pertinent Positives include : Objective:  VITALS:  BP (!) 105/43   Pulse 68   Temp (!) 97.5 F (36.4 C) (Oral)   Resp 18   Ht 5\' 9"  (1.753 m)   Wt 99.6 kg   SpO2 95%   BMI 32.43 kg/m   PHYSICAL EXAM:  General: Alert, cooperative, no distress, appears stated age.  Head: Normocephalic, without obvious abnormality, atraumatic. Eyes: Conjunctivae clear, anicteric sclerae. Pupils are equal ENT Nares normal. No drainage or sinus tenderness. Edentulous Neck: Supple, symmetrical, no adenopathy, thyroid: non tender no carotid bruit and no JVD. Back: No CVA tenderness. Lungs: Clear to auscultation bilaterally. No Wheezing or Rhonchi. No  rales. Heart: S1-S2 Abdomen: Soft, PD catheter in place Extremities: Left foot surgical dressing not removed Right great toe has gangrene at  the tip    skin: No rashes or lesions. Or bruising Lymph: Cervical, supraclavicular normal. Neurologic: Grossly non-focal Pertinent Labs Lab Results CBC    Component Value Date/Time   WBC 11.0 (H) 07/20/2019 0557   RBC 2.51 (L) 07/20/2019 0557   HGB 7.5 (L) 07/20/2019 0557   HGB 10.7 (L) 12/13/2014 0102   HCT 23.8 (L) 07/20/2019 0557   HCT 33.8 (L) 12/13/2014 0102   PLT 292 07/20/2019 0557   PLT 510 (H) 12/13/2014 0102   MCV 94.8 07/20/2019 0557   MCV 90 12/13/2014 0102   MCH 29.9 07/20/2019 0557   MCHC 31.5 07/20/2019 0557   RDW 14.2 07/20/2019 0557   RDW 15.5 (H) 12/13/2014 0102   LYMPHSABS 1.0 07/17/2019 1359   LYMPHSABS 2.3 12/04/2014 0355   MONOABS 0.6 07/17/2019 1359   MONOABS 0.8 12/04/2014 0355   EOSABS 0.3 07/17/2019 1359   EOSABS 0.5 12/04/2014 0355   BASOSABS 0.1 07/17/2019 1359   BASOSABS 0.1 12/04/2014 0355    CMP Latest Ref Rng & Units 07/20/2019 07/19/2019 07/18/2019  Glucose 70 - 99 mg/dL 204(H) 116(H) -  BUN 6 - 20 mg/dL 47(H) 49(H) -  Creatinine 0.61 - 1.24 mg/dL 7.17(H) 7.10(H) -  Sodium 135 - 145 mmol/L 135 135 -  Potassium 3.5 - 5.1 mmol/L 4.4 4.5 4.7  Chloride 98 - 111 mmol/L 99 93(L) -  CO2 22 - 32 mmol/L 23 23 -  Calcium 8.9 - 10.3 mg/dL 7.1(L) 7.3(L) -  Total Protein 6.5 - 8.1 g/dL - - -  Total Bilirubin 0.3 - 1.2 mg/dL - - -  Alkaline Phos 38 - 126  U/L - - -  AST 15 - 41 U/L - - -  ALT 0 - 44 U/L - - -      Microbiology: Recent Results (from the past 240 hour(s))  SARS CORONAVIRUS 2 (TAT 6-24 HRS) Nasopharyngeal Nasopharyngeal Swab     Status: None   Collection Time: 07/14/19  1:11 PM   Specimen: Nasopharyngeal Swab  Result Value Ref Range Status   SARS Coronavirus 2 NEGATIVE NEGATIVE Final    Comment: (NOTE) SARS-CoV-2 target nucleic acids are NOT DETECTED. The SARS-CoV-2 RNA is generally detectable in upper and lower respiratory specimens during the acute phase of infection. Negative results do not preclude SARS-CoV-2 infection, do not rule out co-infections with other pathogens, and should not be used as the sole basis for treatment or other patient management decisions. Negative results must be combined with clinical observations, patient history, and epidemiological information. The expected result is Negative. Fact Sheet for Patients: SugarRoll.be Fact Sheet for Healthcare Providers: https://www.woods-mathews.com/ This test is not yet approved or cleared by the [place] FDA and  has been authorized for detection and/or diagnosis of SARS-CoV-2 by FDA under an Emergency Use Authorization (EUA). This EUA will remain  in effect (meaning this test can be used) for the duration of the COVID-19 declaration under Section 56 4(b)(1) of the Act, 21 U.S.C. section 360bbb-3(b)(1), unless the authorization is terminated or revoked sooner. Performed at [facility], [address], [place], [place] [address]   Surgical PCR screen     Status: None   Collection Time: 07/18/19  6:35 AM   Specimen: Nasal Mucosa; Nasal Swab  Result Value Ref Range Status   MRSA, PCR NEGATIVE NEGATIVE Final   Staphylococcus aureus NEGATIVE  NEGATIVE Final    Comment: (NOTE) The Xpert SA Assay (FDA approved for NASAL specimens in patients 84 years of age and older), is one component of a  comprehensive surveillance program. It is not intended to diagnose infection nor to guide or monitor treatment. Performed at [facility], [address] [place], [place] [address]   Aerobic/Anaerobic Culture (surgical/deep wound)     Status: None (Preliminary result)   Collection Time: 07/19/19  5:11 PM   Specimen: Wound; Tissue  Result Value Ref Range Status   Specimen Description TISSUE  Final   Special Requests LEFT 1ST METATARSAL  Final   Gram Stain   Final    MODERATE WBC PRESENT, PREDOMINANTLY MONONUCLEAR NO ORGANISMS SEEN    Culture   Final    NO GROWTH < 24 HOURS Performed at [facility], [address] [place], [place] [address]    Report Status PENDING  Incomplete  Aerobic/Anaerobic Culture (surgical/deep wound)     Status: None (Preliminary result)   Collection Time: 07/19/19  5:15 PM   Specimen: Wound; Tissue  Result Value Ref Range Status   Specimen Description TISSUE  Final   Special Requests SAMPLE B ,2ND METATARSAL BONE  Final   Gram Stain   Final    RARE WBC PRESENT, PREDOMINANTLY MONONUCLEAR NO ORGANISMS SEEN    Culture   Final    NO GROWTH < 24 HOURS Performed at [facility], [address], [place], [place] [address]    Report Status PENDING  Incomplete    IMAGING RESULTS: X-ray left foot New poor cortical definition of the 2nd metatarsal head suspicious for osteomyelitis. 2. Stable postsurgical changes following amputation through the base of the 1st metatarsal. I have personally reviewed the films ? Impression/Recommendation  Chronic left foot gangrenous infection.  With underlying peripheral vascular disease.  Has been treated with 6 weeks of IV antibiotics from May to July 2020 and angioplasty with stents and then had partial ray great toe amputation September 2020.  Continue to have a nonhealing wound.  Now resulting in further debridement of the metatarsal and second toe ray excision on 07/19/2019.  Currently on  vancomycin and ceftriaxone.  Will change ceftriaxone to cefepime as previous cultures had shown Pseudomonas and Citrobacter.  ? ? ?Severe peripheral vascular disease with stents on Plavix  Rt great toe gangrene   End-stage renal disease on peritoneal dialysis  Diabetes mellitus.  At home gets as needed insulin  Coronary artery disease status post stent  Hyperlipidemia on atorvastatin We will see the wound with the podiatrist tomorrow. ___________________________________________________ Discussed with patient in great detail  note:  This document was prepared using Dragon voice recognition software and may include unintentional dictation errors.

## 2019-07-20 NOTE — Progress Notes (Signed)
Pharmacy Antibiotic Note  Scott [name] is a 58 y.o. male admitted on 07/17/2019 with osteomyelitis. He has ESRD for which he undergoes peritoneal dialysis and receives this treatment overnight. 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 He underwent a successful recanalization of the left lower extremity for limb salvage performed by Dr [name] on 11/17. Dr Cleda Mccreedy is planning a limited debridement of the left foot with second ray amputation today  Vancomycin doses: 11/16 1830 1000 mg 11/17 1905 1250 mg vancomycin levels  12 mcg/mL 11/17 0522 23 mcg/mL 11/18 0755   Goal level < 20 mcg/mL  Plan: 11/19 @ 0500 VR 18 mcg/mL. Will give vanc 1g IV x 1 and recheck w/ am labs.  Height: 5\' 9"  (175.3 cm) Weight: 219 lb 9.3 oz (99.6 kg) IBW/kg (Calculated) : 70.7  Temp (24hrs), Avg:97.7 F (36.5 C), Min:97 F (36.1 C), Max:98.3 F (36.8 C)  Recent Labs  Lab 07/17/19 1359 07/17/19 1530 07/17/19 1823  07/19/19 0755 07/20/19 0142 07/20/19 0557  WBC 10.6*  --   --   --  10.8* 12.3* 11.0*  CREATININE 7.24*  --   --   --  7.10*  --  7.17*  LATICACIDVEN  --  1.3 1.4  --   --   --   --   VANCORANDOM  --   --   --    < > 23  --  18   < > = values in this interval not displayed.    Estimated Creatinine Clearance: 12.8 mL/min (A) (by C-G formula based on SCr of 7.17 mg/dL (H)).    Antimicrobials this admission: 11/16 ceftriaxone >>  11/16 vancomycin >>   Microbiology results: 11/13 SARS CoV-2: negative  11/17 MRSA PCR: negative Thank you for allowing pharmacy to be a part of this patient's care.  Tobie Lords, PharmD, BCPS Clinical Pharmacist 07/20/2019 7:08 AM

## 2019-07-20 NOTE — Progress Notes (Signed)
[facility], [place] 07/20/19  Subjective:   LOS: 3 11/18 0701 - 11/19 0700 In: 766.6 [P.O.:240; I.V.:426.6; IV Piggyback:100] Out: 251 [Urine:201; Blood:50] Patient known to our practice from outpatient dialysis.  This time he is admitted for necrotic wound on the left foot with exposure of the bone.  Diagnosed with osteomyelitis.  Angiogram shows peripheral vascular disease with gangrenous changes to the left second toe and forefoot with nonhealing previous first ray amputation. Nephrology consult requested for continuation of peritoneal dialysis Patient denies any nausea or vomiting.  No shortness of breath Ultrafiltration from peritoneal dialysis was 1150 cc    Objective:  Vital signs in last 24 hours:  Temp:  [97 F (36.1 C)-98.3 F (36.8 C)] 97.7 F (36.5 C) (11/19 0602) Pulse Rate:  [65-81] 81 (11/19 0749) Resp:  [11-19] 17 (11/19 0749) BP: (93-134)/(34-80) 134/62 (11/19 0749) SpO2:  [91 %-100 %] 100 % (11/19 0749) Weight:  [99.6 kg] 99.6 kg (11/19 0500)  Weight change: 9.1 kg Filed Weights   07/17/19 1541 07/19/19 0500 07/20/19 0500  Weight: 95.4 kg 90.5 kg 99.6 kg    Intake/Output:    Intake/Output Summary (Last 24 hours) at 07/20/2019 1129 Last data filed at 07/20/2019 1037 Gross per 24 hour  Intake 1006.56 ml  Output 251 ml  Net 755.56 ml     Physical Exam: General:  No acute distress, sitting up in the bed  HEENT  moist oral mucous membranes  Pulm/lungs  normal breathing effort, clear to auscultation  CVS/Heart  no rub or gallop  Abdomen:   Soft, nontender  Extremities:  Left foot is bandaged, trace edema  Neurologic:  Alert, oriented  Skin:  Congestive changes on the legs  Access:  Right arm AV fistula, PD catheter in place       Basic Metabolic Panel:  Recent Labs  Lab 07/17/19 1359 07/18/19 0857 07/19/19 0755 07/20/19 0557  NA 135  --  135 135  K 4.7 4.7 4.5 4.4  CL 98  --  93* 99  CO2 22  --  23 23   GLUCOSE 128*  --  116* 204*  BUN 47*  --  49* 47*  CREATININE 7.24*  --  7.10* 7.17*  CALCIUM 8.1*  --  7.3* 7.1*     CBC: Recent Labs  Lab 07/17/19 1359 07/19/19 0755 07/20/19 0142 07/20/19 0557  WBC 10.6* 10.8* 12.3* 11.0*  NEUTROABS 8.5*  --   --   --   HGB 9.6* 8.0* 8.1* 7.5*  HCT 30.4* 24.4* 25.7* 23.8*  MCV 94.7 92.1 94.8 94.8  PLT 361 285 318 292      Lab Results  Component Value Date   HEPBSAG Negative 01/19/2017   HEPBSAB Non Reactive 01/18/2017   HEPBIGM Negative 01/18/2017      Microbiology:  Recent Results (from the past 240 hour(s))  SARS CORONAVIRUS 2 (TAT 6-24 HRS) Nasopharyngeal Nasopharyngeal Swab     Status: None   Collection Time: 07/14/19  1:11 PM   Specimen: Nasopharyngeal Swab  Result Value Ref Range Status   SARS Coronavirus 2 NEGATIVE NEGATIVE Final    Comment: (NOTE) SARS-CoV-2 target nucleic acids are NOT DETECTED. The SARS-CoV-2 RNA is generally detectable in upper and lower respiratory specimens during the acute phase of infection. Negative results do not preclude SARS-CoV-2 infection, do not rule out co-infections with other pathogens, and should not be used as the sole basis for treatment or other patient management decisions. Negative results must be combined  with clinical observations, patient history, and epidemiological information. The expected result is Negative. Fact Sheet for Patients: SugarRoll.be Fact Sheet for Healthcare Providers: https://www.woods-mathews.com/ This test is not yet approved or cleared by the [place] FDA and  has been authorized for detection and/or diagnosis of SARS-CoV-2 by FDA under an Emergency Use Authorization (EUA). This EUA will remain  in effect (meaning this test can be used) for the duration of the COVID-19 declaration under Section 56 4(b)(1) of the Act, 21 U.S.C. section 360bbb-3(b)(1), unless the authorization is terminated or revoked  sooner. Performed at [facility], [place] [address], [place], [place] [address]   Surgical PCR screen     Status: None   Collection Time: 07/18/19  6:35 AM   Specimen: Nasal Mucosa; Nasal Swab  Result Value Ref Range Status   MRSA, PCR NEGATIVE NEGATIVE Final   Staphylococcus aureus NEGATIVE NEGATIVE Final    Comment: (NOTE) The Xpert SA Assay (FDA approved for NASAL specimens in patients 37 years of age and older), is one component of a comprehensive surveillance program. It is not intended to diagnose infection nor to guide or monitor treatment. Performed at [facility], [address] [place], [place] [address]   Aerobic/Anaerobic Culture (surgical/deep wound)     Status: None (Preliminary result)   Collection Time: 07/19/19  5:11 PM   Specimen: Wound; Tissue  Result Value Ref Range Status   Specimen Description TISSUE  Final   Special Requests LEFT 1ST METATARSAL  Final   Gram Stain   Final    MODERATE WBC PRESENT, PREDOMINANTLY MONONUCLEAR NO ORGANISMS SEEN    Culture   Final    NO GROWTH < 24 HOURS Performed at [facility], [address], [place], [place] [address]    Report Status PENDING  Incomplete  Aerobic/Anaerobic Culture (surgical/deep wound)     Status: None (Preliminary result)   Collection Time: 07/19/19  5:15 PM   Specimen: Wound; Tissue  Result Value Ref Range Status   Specimen Description TISSUE  Final   Special Requests SAMPLE B ,2ND METATARSAL BONE  Final   Gram Stain   Final    RARE WBC PRESENT, PREDOMINANTLY MONONUCLEAR NO ORGANISMS SEEN    Culture   Final    NO GROWTH < 24 HOURS Performed at [facility], [address] [place], [place] [address]    Report Status PENDING  Incomplete    Coagulation Studies: Recent Labs    07/17/19 1359  LABPROT 14.7  INR 1.2    Urinalysis: No results for input(s): COLORURINE, LABSPEC, PHURINE, GLUCOSEU, HGBUR, BILIRUBINUR, KETONESUR, PROTEINUR, UROBILINOGEN, NITRITE,  LEUKOCYTESUR in the last 72 hours.  Invalid input(s): APPERANCEUR    Imaging: No results found.   Medications:   . sodium chloride    . sodium chloride    . cefTRIAXone (ROCEPHIN)  IV Stopped (07/19/19 1330)  . heparin 1,500 Units/hr (07/20/19 1013)   . atorvastatin  40 mg Oral Daily  . calcitRIOL  0.25 mcg Oral Daily  . calcium acetate  2,001 mg Oral TID WC  . carvedilol  25 mg Oral BID WC  . cholecalciferol  2,000 Units Oral Daily  . feeding supplement (NEPRO CARB STEADY)  237 mL Oral BID BM  . furosemide  80 mg Oral Daily  . gentamicin cream  1 application Topical Daily  . hydrALAZINE  25 mg Oral TID  . insulin aspart  0-9 Units Subcutaneous Q6H  . isosorbide mononitrate  30  mg Oral Daily  . multivitamin  1 tablet Oral Daily  . mupirocin ointment  1 application Nasal BID  . sodium chloride flush  3 mL Intravenous Q12H  . sodium chloride flush  3 mL Intravenous Q12H  . tamsulosin  0.4 mg Oral Daily  . vancomycin variable dose per unstable renal function (pharmacist dosing)   Does not apply See admin instructions  . vitamin C  500 mg Oral BID   sodium chloride, sodium chloride, albuterol, alum & mag hydroxide-simeth, calcium acetate, heparin, HYDROcodone-acetaminophen, HYDROmorphone (DILAUDID) injection, morphine injection, morphine injection, nitroGLYCERIN, ondansetron (ZOFRAN) IV, oxyCODONE, sodium chloride flush, sodium chloride flush  Assessment/ Plan:  58 y.o. male with  end stage renal disease on peritoneal dialysis, congestive heart failure, GERD, hypertension, peripheral vascular disease, diabetes mellitus type 2, diabetic retinopathy, diabetic gastroparesis, admitted with gangrene of left first toe.  [facility] CAPD 1868mL fills 4 exchanges  Principal Problem:   PAD (peripheral artery disease) (HCC) Active Problems:   Type 2 diabetes mellitus with other diabetic kidney complication (HCC)   ESRD (end stage renal disease) ([place])   Other  hyperlipidemia   Type II diabetes mellitus ([place])   Coronary artery disease   Osteomyelitis of second toe of left foot (HCC)   Ulcerated, foot, right, with necrosis of muscle ([place])   #. ESRD. On CAPD  Continue CCPD while in the hospital. In-hospital prescription of 4 cycles of 2000 cc.  1.5%.  #. Anemia of CKD  Lab Results  Component Value Date   HGB 7.5 (L) 07/20/2019  start SQ EPO   #.  Secondary hyperparathyroidism    Component Value Date/Time   PTH 164 (H) 09/18/2018 1439   Lab Results  Component Value Date   PHOS 8.7 (H) 05/25/2019  continue home dose of PhosLo   #. Diabetes type 2 with CKD Hemoglobin A1C (no units)  Date Value  09/17/2015 9.1   Hgb A1c MFr Bld (%)  Date Value  07/17/2019 6.0 (H)   # Osteomyelitis of left foot with necrosis and osteomyelitis of first metatarsal, gangrenous changes with deep tissue ulceration - Plan as per podiatrist and IM team - underwent amputation second toe partial ray resection left, irrigation and debridement of left foot.  #Right hydroureteronephrosis -Following with urologist Dr. Erlene Quan -cystoscopy is planned as outpatient   LOS: [place] 11/19/202011:29 AM  [facility] [place], [place]

## 2019-07-20 NOTE — Progress Notes (Addendum)
PROGRESS NOTE    Scott [name]  JKK:[id number] DOB: 1960/12/14 DOA: 07/17/2019 PCP: [facility], [place]    Brief Narrative:   Scott [name] is a 58 y.o. male with medical history significant of  Rt toe pain since 3 days no trauma /4/10 right now /off and on and unrelated to any ppting or worsening factors. Pt has been taking pain meds and it helps it. Pain throbbing in nature and  is better with elevation of his leg. .  Worse with bearing weight.  Pain is localized and NR.  Patient has a past medical history of left partial MTP amputation, peripheral arterial disease, diabetes mellitus type 2, end-stage renal disease on peritoneal dialysis seen in the emergency room for right toe pain that has been going on for the past few days.  Patient states that it is throbbing in nature and intermittent in nature nonradiating and he takes his pain medications which helps his pain. He was at his podiatrist appointment today for left foot OM and second toe gangrene with rt great toe gangrene with ulceration.He follows up with wound care clinic [facility]. Pt is scheduled for angiogram tomorrow.  Overall pt is stable except for pain and is to be admitted for OM  And PAD causing nonhealing ischemic ulcers. Please note patient has had a nuclear myocardial perfusion study earlier this month I suspect as part of a preoperative work-up for preprocedural work-up for the patient, this study shows decreased ejection fraction to less than 30%, with a intermediate risk study no ST segment deviation noted during stress portion patient does have a medium defect of severe severity present in the apical anterior apical septal and apical inferior and apex location.  Chart review does show that patient has seen a cardiologist in 2018 however I do not see any new follow-up or office visits.  Patient does follow-up with his wound and vascular doctors and podiatrist on a routine regular basis.  ED Course:   Blood pressure (!) 137/46, pulse 85, temperature 97.9 F (36.6 C), temperature source Oral, resp. rate 18, height 5\' 9"  (1.753 m), weight 95.7 kg, SpO2 94 %.Pt is chronically ill appearing and abd is distended. Pt has been seen by Vascular surgeon and is receiving iv abx rocephin and vancomycin.  Initial labs show a white count of 10.6, hemoglobin of 9.6, RDW of 14.2, platelets of 361.  CMP shows sodium of 135, glucose of 128, BUN of 47, serum creatinine of 7.24.  Interim History: -11/17: s/p left leg angiography with intervention -11/18: had procedure: 1.  His incision with debridement of infected bone and soft tissue left first metatarsal. 2.  Amputation left second toe with partial ray resection.  Subjective:  Pt has generalized weakness, mild SOB, and mild cough. No CP. No fever or chill. No GI symptoms.  Assessment & Plan:   Principal Problem:   PAD (peripheral artery disease) (HCC) Active Problems:   Type 2 diabetes mellitus with other diabetic kidney complication (HCC)   ESRD (end stage renal disease) ([place])   Other hyperlipidemia   Type II diabetes mellitus ([name])   Coronary artery disease   Osteomyelitis of second toe of left foot (HCC)   Ulcerated, foot, right, with necrosis of muscle (HCC)  Osteomyelitis of  left toe/right hallux with gangrene: s/p of angiogram 11/17.  Blood culture no grow so far. --Continue with antibiotics vancomycin Rocephin -Vascular surgery following -Podiatry following -per [name], 'Consult is in for infectious disease  and he should need IV antibiotics with PICC placement prior to discharge". Will discuss with pharm tomorrow, per Dr. Candiss Norse, "depending on what he needs we could do antibotics intraperitoneally. what regimen is recommended. WE can discuss with pharmacy alo tomorrow"  Peripheral arterial disease: Vasc surg consult with angio 11/17 and s/p left leg angiography with intervention. Pt feels better. Foot is warm. -on IV heparin -->will  ask VVS for the next plan regarding anticoagulant use  End-stage renal disease on peritoneal dialysis: -renal was consulted for dialysis -Patient did not have his peritoneal dialysis last night -Electrolyte monitoring with daily labs  Type 2 diabetes: -Continue sliding scale insulin -ADA diet -Blood glucose AC at bedtime  CAD -Antiplatelets held -Restart pending results of test  -Continue Coreg, Imdur, Lasix and hydralazine   DVT prophylaxis: on IV heparin Code Status: full Family Communication: no, not at bedside Disposition Plan: not ready for discharge. Still need IV heparin and IV antibiotics. arriers for discharge:    Consultants:   VVS  Renal  surgeon  Procedures: -11/17: s/p left leg angiography with intervention -11/18: had procedure: 1.  His incision with debridement of infected bone and soft tissue left first metatarsal. 2.  Amputation left second toe with partial ray resection. - 11/17: Procedure(s) Performed: 1. Introduction catheter into left lower extremity 3rd order catheter placement  2. Contrast injection left lower extremity for distal runoff with additional 3rd order  3. Retrieval portion of CXI catheter from left SFA with a trilobed snare              4. Percutaneous transluminal angioplasty and stent placement left anterior tibial  5. Thromboembolectomy with the penumbra cat 6 device left anterior tibial              6.  Star close closure right common femoral arteriotomy  Antimicrobials: Anti-infectives (From admission, onward)   Start     Dose/Rate Route Frequency Ordered Stop   07/20/19 0921  vancomycin variable dose per unstable renal function (pharmacist dosing)      Does not apply See admin instructions 07/20/19 0921     07/20/19 0715  vancomycin (VANCOCIN) IVPB 1000 mg/200 mL premix     1,000 mg 200 mL/hr over 60 Minutes Intravenous  Once 07/20/19 0707 07/20/19 0907    07/19/19 1558  ceFAZolin (ANCEF) 1-4 GM/50ML-% IVPB    Note to Pharmacy: Sylvester Harder   : cabinet override      07/19/19 1558 07/20/19 0414   07/18/19 1400  cefTRIAXone (ROCEPHIN) 2 g in sodium chloride 0.9 % 100 mL IVPB     2 g 200 mL/hr over 30 Minutes Intravenous Every 24 hours 07/18/19 0000     07/18/19 0845  ceFAZolin (ANCEF) IVPB 1 g/50 mL premix     1 g 100 mL/hr over 30 Minutes Intravenous  Once 07/18/19 0833 07/18/19 1044   07/18/19 0700  vancomycin (VANCOCIN) 1,250 mg in sodium chloride 0.9 % 250 mL IVPB     1,250 mg 166.7 mL/hr over 90 Minutes Intravenous  Once 07/18/19 0653 07/18/19 2035   07/17/19 1300  vancomycin (VANCOCIN) IVPB 1000 mg/200 mL premix     1,000 mg 200 mL/hr over 60 Minutes Intravenous  Once 07/17/19 1257 07/18/19 0010   07/17/19 1300  cefTRIAXone (ROCEPHIN) 2 g in sodium chloride 0.9 % 100 mL IVPB     2 g 200 mL/hr over 30 Minutes Intravenous  Once 07/17/19 1257 07/17/19 1648      Objective: Vitals:  07/20/19 0602 07/20/19 0749 07/20/19 1132 07/20/19 1540  BP: (!) 93/48 134/62 (!) 96/54 (!) 105/43  Pulse: 71 81 70 68  Resp: 16 17 18    Temp: 97.7 F (36.5 C)  (!) 97.5 F (36.4 C)   TempSrc: Oral  Oral   SpO2: 97% 100% 95%   Weight:      Height:        Intake/Output Summary (Last 24 hours) at 07/20/2019 1653 Last data filed at 07/20/2019 1512 Gross per 24 hour  Intake 1165.81 ml  Output 50 ml  Net 1115.81 ml   Filed Weights   07/17/19 1541 07/19/19 0500 07/20/19 0500  Weight: 95.4 kg 90.5 kg 99.6 kg    Examination:  Physical Exam:  General: Not in acute distress HEENT: PERRL, EOMI, no scleral icterus, No JVD or bruit Cardiac: S1/S2, RRR, No murmurs, gallops or rubs Pulm: No rales, wheezing, rhonchi or rubs. Abd: Soft, nondistended, nontender, no rebound pain, no organomegaly, BS present Ext: s/p of  amputation left second toe with partial ray resection. S/p of amputation of left great toe. Left foot is warm.  Musculoskeletal: No  joint deformities, erythema, or stiffness, ROM full Skin: No rashes.  Neuro: Alert and oriented X3, cranial nerves II-XII grossly intact, moves all extremeties normally. Psych: Patient is not psychotic, no suicidal or hemocidal ideation.    Data Reviewed: I have personally reviewed following labs and imaging studies  CBC: Recent Labs  Lab 07/17/19 1359 07/19/19 0755 07/20/19 0142 07/20/19 0557  WBC 10.6* 10.8* 12.3* 11.0*  NEUTROABS 8.5*  --   --   --   HGB 9.6* 8.0* 8.1* 7.5*  HCT 30.4* 24.4* 25.7* 23.8*  MCV 94.7 92.1 94.8 94.8  PLT 361 285 318 185   Basic Metabolic Panel: Recent Labs  Lab 07/17/19 1359 07/18/19 0857 07/19/19 0755 07/20/19 0557  NA 135  --  135 135  K 4.7 4.7 4.5 4.4  CL 98  --  93* 99  CO2 22  --  23 23  GLUCOSE 128*  --  116* 204*  BUN 47*  --  49* 47*  CREATININE 7.24*  --  7.10* 7.17*  CALCIUM 8.1*  --  7.3* 7.1*   GFR: Estimated Creatinine Clearance: 12.8 mL/min (A) (by C-G formula based on SCr of 7.17 mg/dL (H)). Liver Function Tests: No results for input(s): AST, ALT, ALKPHOS, BILITOT, PROT, ALBUMIN in the last 168 hours. No results for input(s): LIPASE, AMYLASE in the last 168 hours. No results for input(s): AMMONIA in the last 168 hours. Coagulation Profile: Recent Labs  Lab 07/17/19 1359  INR 1.2   Cardiac Enzymes: Recent Labs  Lab 07/18/19 0522  CKTOTAL 88   BNP (last 3 results) No results for input(s): PROBNP in the last 8760 hours. HbA1C: No results for input(s): HGBA1C in the last 72 hours. CBG: Recent Labs  Lab 07/19/19 2211 07/20/19 0052 07/20/19 0514 07/20/19 1130 07/20/19 1632  GLUCAP 192* 223* 207* 222* 146*   Lipid Profile: No results for input(s): CHOL, HDL, LDLCALC, TRIG, CHOLHDL, LDLDIRECT in the last 72 hours. Thyroid Function Tests: No results for input(s): TSH, T4TOTAL, FREET4, T3FREE, THYROIDAB in the last 72 hours. Anemia Panel: No results for input(s): VITAMINB12, FOLATE, FERRITIN, TIBC, IRON,  RETICCTPCT in the last 72 hours. Sepsis Labs: Recent Labs  Lab 07/17/19 1530 07/17/19 1823  LATICACIDVEN 1.3 1.4    Recent Results (from the past 240 hour(s))  SARS CORONAVIRUS 2 (TAT 6-24 HRS) Nasopharyngeal Nasopharyngeal Swab  Status: None   Collection Time: 07/14/19  1:11 PM   Specimen: Nasopharyngeal Swab  Result Value Ref Range Status   SARS Coronavirus 2 NEGATIVE NEGATIVE Final    Comment: (NOTE) SARS-CoV-2 target nucleic acids are NOT DETECTED. The SARS-CoV-2 RNA is generally detectable in upper and lower respiratory specimens during the acute phase of infection. Negative results do not preclude SARS-CoV-2 infection, do not rule out co-infections with other pathogens, and should not be used as the sole basis for treatment or other patient management decisions. Negative results must be combined with clinical observations, patient history, and epidemiological information. The expected result is Negative. Fact Sheet for Patients: SugarRoll.be Fact Sheet for Healthcare Providers: https://www.woods-mathews.com/ This test is not yet approved or cleared by the [place] FDA and  has been authorized for detection and/or diagnosis of SARS-CoV-2 by FDA under an Emergency Use Authorization (EUA). This EUA will remain  in effect (meaning this test can be used) for the duration of the COVID-19 declaration under Section 56 4(b)(1) of the Act, 21 U.S.C. section 360bbb-3(b)(1), unless the authorization is terminated or revoked sooner. Performed at [facility], [place] [address], [place], [place] [address]   Surgical PCR screen     Status: None   Collection Time: 07/18/19  6:35 AM   Specimen: Nasal Mucosa; Nasal Swab  Result Value Ref Range Status   MRSA, PCR NEGATIVE NEGATIVE Final   Staphylococcus aureus NEGATIVE NEGATIVE Final    Comment: (NOTE) The Xpert SA Assay (FDA approved for NASAL specimens in patients 1 years of age  and older), is one component of a comprehensive surveillance program. It is not intended to diagnose infection nor to guide or monitor treatment. Performed at [facility], [address] [place], [place] [address]   Aerobic/Anaerobic Culture (surgical/deep wound)     Status: None (Preliminary result)   Collection Time: 07/19/19  5:11 PM   Specimen: Wound; Tissue  Result Value Ref Range Status   Specimen Description TISSUE  Final   Special Requests LEFT 1ST METATARSAL  Final   Gram Stain   Final    MODERATE WBC PRESENT, PREDOMINANTLY MONONUCLEAR NO ORGANISMS SEEN    Culture   Final    NO GROWTH < 24 HOURS Performed at [facility], [address] [place], [place] [address]    Report Status PENDING  Incomplete  Aerobic/Anaerobic Culture (surgical/deep wound)     Status: None (Preliminary result)   Collection Time: 07/19/19  5:15 PM   Specimen: Wound; Tissue  Result Value Ref Range Status   Specimen Description TISSUE  Final   Special Requests SAMPLE B ,2ND METATARSAL BONE  Final   Gram Stain   Final    RARE WBC PRESENT, PREDOMINANTLY MONONUCLEAR NO ORGANISMS SEEN    Culture   Final    NO GROWTH < 24 HOURS Performed at [facility], [address], [place], [place] [address]    Report Status PENDING  Incomplete     RN Pressure Injury Documentation:       Radiology Studies: No results found.      Scheduled Meds: . atorvastatin  40 mg Oral Daily  . calcitRIOL  0.25 mcg Oral Daily  . calcium acetate  2,001 mg Oral TID WC  . carvedilol  25 mg Oral BID WC  . cholecalciferol  2,000 Units Oral Daily  . feeding supplement (NEPRO CARB STEADY)  237 mL Oral BID BM  . furosemide  80 mg Oral  Daily  . gentamicin cream  1 application Topical Daily  . hydrALAZINE  25 mg Oral TID  . insulin aspart  0-9 Units Subcutaneous TID AC & HS  . isosorbide mononitrate  30 mg Oral Daily  . multivitamin  1 tablet Oral Daily  . mupirocin ointment  1 application  Nasal BID  . sodium chloride flush  3 mL Intravenous Q12H  . sodium chloride flush  3 mL Intravenous Q12H  . tamsulosin  0.4 mg Oral Daily  . vancomycin variable dose per unstable renal function (pharmacist dosing)   Does not apply See admin instructions  . vitamin C  500 mg Oral BID   Continuous Infusions: . sodium chloride    . sodium chloride    . cefTRIAXone (ROCEPHIN)  IV Stopped (07/20/19 1349)  . heparin 1,750 Units/hr (07/20/19 1512)     LOS: 3 days    Time spent: 30 min     Ivor Costa, DO [facility] PAGER is on [place]  If 7PM-7AM, please contact night-coverage [email or web address] Password [email or web address] 07/20/2019, 4:53 PM

## 2019-07-20 NOTE — Progress Notes (Signed)
Established peritoneal patient known at [place] ([place]). Patient self transports to doctor appointments. If patient status changes from home to rehab, please inform me as soon as possible to get a hemodialysis chair time.   Elvera Bicker Dialysis Coordinator [phone]

## 2019-07-20 NOTE — Progress Notes (Signed)
PT Cancellation Note  Patient Details Name: Scott [name] MRN: [id number] DOB: 1960-10-14   Cancelled Treatment:    Reason Eval/Treat Not Completed: Patient not medically ready. Order received for PT evaluation. Pt still under active bedrest orders. Author unable to locate weightbearing status and unable to find order for any postoperative footwear. Will continue to follow remotely and coordinate with RN. Will proceed with PT evaluation at later date/time.   9:09 AM, 07/20/19 Etta Grandchild, PT, DPT Physical Therapist - [facility]  [phone] ([place])    Kloee Ballew C 07/20/2019, 9:08 AM

## 2019-07-20 NOTE — TOC Progression Note (Signed)
Transition of Care [facility]) - Progression Note    Patient Details  Name: [name] MRN: [id number] Date of Birth: 09-May-1961  Transition of Care [facility]) CM/SW Contact  Beverly Sessions, RN Phone Number: 07/20/2019, 4:02 PM  Clinical Narrative:     RW delivered by [facility] with [place]  Expected Discharge Plan: [place] Barriers to Discharge: Continued Medical Work up  Expected Discharge Plan and Services Expected Discharge Plan: [place]   Discharge Planning Services: CM Consult   Living arrangements for the past 2 months: Single Family Home                                       Social Determinants of Health (SDOH) Interventions    Readmission Risk Interventions Readmission Risk Prevention Plan 07/19/2019 07/19/2019  Transportation Screening Complete Complete  Medication Review Press photographer) Complete Complete  HRI or Home Care Consult Complete Complete  Palliative Care Screening Not Applicable -  [place] Not Applicable -  Some recent data might be hidden

## 2019-07-20 NOTE — Consult Note (Signed)
ANTICOAGULATION CONSULT NOTE  Pharmacy Consult for heparin drip Indication: VTE Treatment   Patient Measurements: Height: 5\' 9"  (175.3 cm) Weight: 199 lb 8.3 oz (90.5 kg) IBW/kg (Calculated) : 70.7 Heparin Dosing Weight: 90.5 kg  Vital Signs: Temp: 98 F (36.7 C) (11/18 2006) Temp Source: Oral (11/18 2006) BP: 101/34 (11/18 2006) Pulse Rate: 74 (11/18 2006)  Labs: Recent Labs    07/17/19 1359 07/18/19 0522 07/18/19 2347 07/19/19 0755 07/20/19 0142  HGB 9.6*  --   --  8.0* 8.1*  HCT 30.4*  --   --  24.4* 25.7*  PLT 361  --   --  285 318  LABPROT 14.7  --   --   --   --   INR 1.2  --   --   --   --   HEPARINUNFRC  --   --  0.29* <0.10* <0.10*  CREATININE 7.24*  --   --  7.10*  --   CKTOTAL  --  88  --   --   --     Estimated Creatinine Clearance: 12.3 mL/min (A) (by C-G formula based on SCr of 7.1 mg/dL (H)).   Medical History: Past Medical History:  Diagnosis Date  . Acute respiratory failure with hypoxia ([place]) 09/17/2018  . Arthritis    "left arm; right leg" (12/13/2014)  . Asthma   . CHF (congestive heart failure) ([place])   . Chronic disease anemia    [name] 12/13/2014  . Chronic kidney disease (CKD), stage IV (severe) ([place])    [name] 12/13/2014... on dialysis  . Complication of anesthesia    unable to urinate after CAPD urgery  . Continuous ambulatory peritoneal dialysis status ([place])   . Coronary artery disease    [name] 12/13/2014  . Depression   . Dysrhythmia    patient unaware of irregular heartbeat  . GERD (gastroesophageal reflux disease)   . High cholesterol    [name] 12/13/2014  . Hypertension   . Non-Q wave myocardial infarction ([place])    [name] 12/13/2014  . PVD (peripheral vascular disease) ([place])    [name] 12/13/2014  . Type II diabetes mellitus (HCC)     Medications:  Scheduled:  . atorvastatin  40 mg Oral Daily  . calcitRIOL  0.25 mcg Oral Daily  . calcium acetate  2,001 mg Oral TID WC  . carvedilol  25 mg Oral BID WC  . cholecalciferol   2,000 Units Oral Daily  . feeding supplement (NEPRO CARB STEADY)  237 mL Oral BID BM  . furosemide  80 mg Oral Daily  . gentamicin cream  1 application Topical Daily  . heparin  2,700 Units Intravenous Once  . hydrALAZINE  25 mg Oral TID  . insulin aspart  0-9 Units Subcutaneous Q6H  . isosorbide mononitrate  30 mg Oral Daily  . multivitamin  1 tablet Oral Daily  . mupirocin ointment  1 application Nasal BID  . sodium chloride flush  3 mL Intravenous Q12H  . sodium chloride flush  3 mL Intravenous Q12H  . tamsulosin  0.4 mg Oral Daily  . vitamin C  500 mg Oral BID    Assessment: 58 y.o. male with medical history significant of  Rt toe pain with extensive and severe PAD history. Today Dr [name] performed an angiogram and is recommending heparin with 1/2 of the nomogram initial dose then per nomogram. He is on no chronic anticoagulation PTA. Baseline labs:  INR 1.2, Hgb 9.6, PLT wnl  Heparin was stopped earlier today  for a procedure. Last HL was <0.10 @0755  today. Heparin rate was not changed was a result of this level not being therapeutic. Of note, heparin was stopped at 1215 today.   Goal of Therapy:   Heparin level 0.3-0.7 units/ml Monitor platelets by anticoagulation protocol: Yes   Plan:  11/19 @ 0130 HL < 0.10 subtherapeutic. Per RN drip was stopped for roughly 5 minutes for line change, was also stopped during the day for angiogram. Will rebolus heparin 2700 units IV x 1 and increase rate to 1500 units/hr and will recheck HL @ 1100, CBC trending down will continue to monitor.  Tobie Lords, PharmD 07/20/2019,5:03 AM

## 2019-07-20 NOTE — Progress Notes (Signed)
1 Day Post-Op   Subjective/Chief Complaint: Patient seen.  No significant complaints of pain.   Objective: Vital signs in last 24 hours: Temp:  [97 F (36.1 C)-98.3 F (36.8 C)] 97.5 F (36.4 C) (11/19 1132) Pulse Rate:  [65-81] 70 (11/19 1132) Resp:  [11-19] 18 (11/19 1132) BP: (93-134)/(34-80) 96/54 (11/19 1132) SpO2:  [92 %-100 %] 95 % (11/19 1132) Weight:  [99.6 kg] 99.6 kg (11/19 0500) Last BM Date: 07/20/19  Intake/Output from previous day: 11/18 0701 - 11/19 0700 In: 766.6 [P.O.:240; I.V.:426.6; IV Piggyback:100] Out: 251 [Urine:201; Blood:50] Intake/Output this shift: Total I/O In: 240 [P.O.:240] Out: -   The bandage on the left foot is dry and intact.  Upon removal there is some moderate bleeding on the bandaging with no evidence of any purulence.  Incision is well coapted and at this point looks stable.  Lab Results:  Recent Labs    07/20/19 0142 07/20/19 0557  WBC 12.3* 11.0*  HGB 8.1* 7.5*  HCT 25.7* 23.8*  PLT 318 292   BMET Recent Labs    07/19/19 0755 07/20/19 0557  NA 135 135  K 4.5 4.4  CL 93* 99  CO2 23 23  GLUCOSE 116* 204*  BUN 49* 47*  CREATININE 7.10* 7.17*  CALCIUM 7.3* 7.1*   PT/INR Recent Labs    07/17/19 1359  LABPROT 14.7  INR 1.2   ABG No results for input(s): PHART, HCO3 in the last 72 hours.  Invalid input(s): PCO2, PO2  Studies/Results: No results found.  Anti-infectives: Anti-infectives (From admission, onward)   Start     Dose/Rate Route Frequency Ordered Stop   07/20/19 0921  vancomycin variable dose per unstable renal function (pharmacist dosing)      Does not apply See admin instructions 07/20/19 0921     07/20/19 0715  vancomycin (VANCOCIN) IVPB 1000 mg/200 mL premix     1,000 mg 200 mL/hr over 60 Minutes Intravenous  Once 07/20/19 0707 07/20/19 0907   07/19/19 1558  ceFAZolin (ANCEF) 1-4 GM/50ML-% IVPB    Note to Pharmacy: Sylvester Harder   : cabinet override      07/19/19 1558 07/20/19 0414   07/18/19  1400  cefTRIAXone (ROCEPHIN) 2 g in sodium chloride 0.9 % 100 mL IVPB     2 g 200 mL/hr over 30 Minutes Intravenous Every 24 hours 07/18/19 0000     07/18/19 0845  ceFAZolin (ANCEF) IVPB 1 g/50 mL premix     1 g 100 mL/hr over 30 Minutes Intravenous  Once 07/18/19 0833 07/18/19 1044   07/18/19 0700  vancomycin (VANCOCIN) 1,250 mg in sodium chloride 0.9 % 250 mL IVPB     1,250 mg 166.7 mL/hr over 90 Minutes Intravenous  Once 07/18/19 0653 07/18/19 2035   07/17/19 1300  vancomycin (VANCOCIN) IVPB 1000 mg/200 mL premix     1,000 mg 200 mL/hr over 60 Minutes Intravenous  Once 07/17/19 1257 07/18/19 0010   07/17/19 1300  cefTRIAXone (ROCEPHIN) 2 g in sodium chloride 0.9 % 100 mL IVPB     2 g 200 mL/hr over 30 Minutes Intravenous  Once 07/17/19 1257 07/17/19 1648      Assessment/Plan: s/p Procedure(s): AMPUTATION 2nd TOE PARTIAL RAY RESECTION (Left) IRRIGATION AND DEBRIDEMENT FOOT (Left) Assessment: Stable status post debridement with second ray amputation.   Plan: Betadine and a sterile bandage reapplied to the left foot.  Instructed the patient that he will keep this clean dry and do not remove.  No need for bandage change  at this point.  I will plan on following up with him next Monday or Tuesday outpatient.  Consult is in for infectious disease and he should need IV antibiotics with PICC placement prior to discharge.  From podiatry standpoint he is stable for discharge at any time.  I did discuss with the patient that even though he has the wedge shoe and can put a little pressure on the heel recommended that he stay completely nonweightbearing as much as possible.  LOS: 3 days    Durward Fortes 07/20/2019

## 2019-07-21 ENCOUNTER — Other Ambulatory Visit: Payer: Self-pay

## 2019-07-21 DIAGNOSIS — I96 Gangrene, not elsewhere classified: Secondary | ICD-10-CM | POA: Diagnosis not present

## 2019-07-21 DIAGNOSIS — M86172 Other acute osteomyelitis, left ankle and foot: Secondary | ICD-10-CM

## 2019-07-21 DIAGNOSIS — M869 Osteomyelitis, unspecified: Secondary | ICD-10-CM | POA: Diagnosis not present

## 2019-07-21 DIAGNOSIS — I739 Peripheral vascular disease, unspecified: Secondary | ICD-10-CM | POA: Diagnosis not present

## 2019-07-21 DIAGNOSIS — D631 Anemia in chronic kidney disease: Secondary | ICD-10-CM

## 2019-07-21 DIAGNOSIS — N186 End stage renal disease: Secondary | ICD-10-CM | POA: Diagnosis not present

## 2019-07-21 DIAGNOSIS — E1152 Type 2 diabetes mellitus with diabetic peripheral angiopathy with gangrene: Secondary | ICD-10-CM | POA: Diagnosis not present

## 2019-07-21 DIAGNOSIS — E1129 Type 2 diabetes mellitus with other diabetic kidney complication: Secondary | ICD-10-CM | POA: Diagnosis not present

## 2019-07-21 DIAGNOSIS — E1169 Type 2 diabetes mellitus with other specified complication: Secondary | ICD-10-CM | POA: Diagnosis not present

## 2019-07-21 LAB — CBC
HCT: 23 % — ABNORMAL LOW (ref 39.0–52.0)
Hemoglobin: 7.7 g/dL — ABNORMAL LOW (ref 13.0–17.0)
MCH: 30.6 pg (ref 26.0–34.0)
MCHC: 33.5 g/dL (ref 30.0–36.0)
MCV: 91.3 fL (ref 80.0–100.0)
Platelets: 301 10*3/uL (ref 150–400)
RBC: 2.52 MIL/uL — ABNORMAL LOW (ref 4.22–5.81)
RDW: 14.5 % (ref 11.5–15.5)
WBC: 12.3 10*3/uL — ABNORMAL HIGH (ref 4.0–10.5)
nRBC: 0 % (ref 0.0–0.2)

## 2019-07-21 LAB — ALBUMIN: Albumin: 1.9 g/dL — ABNORMAL LOW (ref 3.5–5.0)

## 2019-07-21 LAB — HEPATITIS B SURFACE ANTIGEN: Hepatitis B Surface Ag: NONREACTIVE

## 2019-07-21 LAB — BASIC METABOLIC PANEL
Anion gap: 14 (ref 5–15)
BUN: 49 mg/dL — ABNORMAL HIGH (ref 6–20)
CO2: 24 mmol/L (ref 22–32)
Calcium: 7.3 mg/dL — ABNORMAL LOW (ref 8.9–10.3)
Chloride: 98 mmol/L (ref 98–111)
Creatinine, Ser: 6.89 mg/dL — ABNORMAL HIGH (ref 0.61–1.24)
GFR calc Af Amer: 9 mL/min — ABNORMAL LOW (ref >60)
GFR calc non Af Amer: 8 mL/min — ABNORMAL LOW (ref >60)
Glucose, Bld: 190 mg/dL — ABNORMAL HIGH (ref 70–99)
Potassium: 4 mmol/L (ref 3.5–5.1)
Sodium: 136 mmol/L (ref 135–145)

## 2019-07-21 LAB — GLUCOSE, CAPILLARY
Glucose-Capillary: 105 mg/dL — ABNORMAL HIGH (ref 70–99)
Glucose-Capillary: 128 mg/dL — ABNORMAL HIGH (ref 70–99)
Glucose-Capillary: 140 mg/dL — ABNORMAL HIGH (ref 70–99)
Glucose-Capillary: 175 mg/dL — ABNORMAL HIGH (ref 70–99)
Glucose-Capillary: 48 mg/dL — ABNORMAL LOW (ref 70–99)

## 2019-07-21 LAB — VANCOMYCIN, RANDOM: Vancomycin Rm: 27

## 2019-07-21 LAB — SURGICAL PATHOLOGY

## 2019-07-21 LAB — PHOSPHORUS: Phosphorus: 5.1 mg/dL — ABNORMAL HIGH (ref 2.5–4.6)

## 2019-07-21 MED ORDER — ASCORBIC ACID 500 MG PO TABS: 500.0000 mg | ORAL_TABLET | Freq: Two times a day (BID) | ORAL | 1 refills | Status: DC

## 2019-07-21 MED ORDER — DEXTROSE 50 % IV SOLN
25.0000 g | INTRAVENOUS | Status: AC
  Administered 2019-07-21: 25 g via INTRAVENOUS
  Filled 2019-07-21: qty 50

## 2019-07-21 MED ORDER — VANCOMYCIN IV (FOR PTA / DISCHARGE USE ONLY): 2000.0000 mg | INTRAVENOUS | 0 refills | Status: DC

## 2019-07-21 MED ORDER — HYDRALAZINE HCL 25 MG PO TABS: 25.0000 mg | ORAL_TABLET | Freq: Three times a day (TID) | ORAL | 1 refills | Status: DC

## 2019-07-21 MED ORDER — ALUM & MAG HYDROXIDE-SIMETH 200-200-20 MG/5ML PO SUSP
15.0000 mL | ORAL | Status: DC | PRN
  Administered 2019-07-21: 15 mL via ORAL
  Filled 2019-07-21: qty 30

## 2019-07-21 MED ORDER — MUPIROCIN 2 % EX OINT: 1.0000 "application " | TOPICAL_OINTMENT | Freq: Two times a day (BID) | CUTANEOUS | 0 refills | Status: DC

## 2019-07-21 MED ORDER — NEPRO/CARBSTEADY PO LIQD: 237.0000 mL | Freq: Two times a day (BID) | ORAL | 0 refills | Status: DC

## 2019-07-21 MED ORDER — CEFTAZIDIME IV (FOR PTA / DISCHARGE USE ONLY): 1.0000 g | INTRAVENOUS | 0 refills | Status: DC

## 2019-07-21 MED ORDER — LEVOFLOXACIN 500 MG PO TABS: 500.0000 mg | ORAL_TABLET | Freq: Every day | ORAL | 0 refills | Status: AC

## 2019-07-21 MED ORDER — NYSTATIN 100000 UNIT/ML MT SUSP
5.0000 mL | Freq: Three times a day (TID) | OROMUCOSAL | Status: DC
  Administered 2019-07-21: 500000 [IU] via ORAL
  Filled 2019-07-21: qty 5

## 2019-07-21 MED ORDER — NYSTATIN 100000 UNIT/ML MT SUSP: 5.0000 mL | Freq: Three times a day (TID) | OROMUCOSAL | 0 refills | Status: DC

## 2019-07-21 MED ORDER — ASPIRIN EC 81 MG PO TBEC: 81.0000 mg | DELAYED_RELEASE_TABLET | Freq: Every day | ORAL | 1 refills | Status: AC

## 2019-07-21 MED ORDER — NYSTATIN 500000 UNITS PO TABS: 500000.0000 [IU] | ORAL_TABLET | Freq: Three times a day (TID) | ORAL | Status: DC

## 2019-07-21 NOTE — Progress Notes (Signed)
Dal A Mcgriff  A and O x 4. VSS. Pt tolerating diet well. No complaints of pain or nausea. IV removed intact, prescriptions given. Pt voiced understanding of discharge instructions with no further questions. Pt discharged via wheelchair with NT.    Allergies as of 07/21/2019   No Known Allergies     Medication List    STOP taking these medications   HYDROcodone-acetaminophen 5-325 MG tablet Commonly known as: NORCO/VICODIN     TAKE these medications   albuterol 108 (90 Base) MCG/ACT inhaler Commonly known as: VENTOLIN HFA Inhale 2 puffs into the lungs every 6 (six) hours as needed for wheezing or shortness of breath.   ascorbic acid 500 MG tablet Commonly known as: VITAMIN C Take 1 tablet (500 mg total) by mouth 2 (two) times daily.   aspirin EC 81 MG tablet Take 1 tablet (81 mg total) by mouth daily.   atorvastatin 40 MG tablet Commonly known as: LIPITOR Take 40 mg by mouth daily.   calcitRIOL 0.25 MCG capsule Commonly known as: ROCALTROL Take 0.25 mcg by mouth daily.   calcium acetate 667 MG capsule Commonly known as: PHOSLO Take 2,001 mg by mouth 3 (three) times daily with meals. Takes 1-2 caps if he has snacks   carvedilol 25 MG tablet Commonly known as: COREG Take 1 tablet (25 mg total) by mouth 2 (two) times daily with a meal.   cefTAZidime  IVPB Commonly known as: FORTAZ Inject 1 g into the vein daily for 14 days. Indication:  cellulitis Last Day of Therapy:  08/07/19 Please Administer this Antibiotic Intraperitoneally Start taking on: July 24, 2019   clopidogrel 75 MG tablet Commonly known as: PLAVIX Take 1 tablet (75 mg total) by mouth daily with breakfast.   Dialyvite 800 0.8 MG Tabs Take 1 tablet by mouth daily.   feeding supplement (NEPRO CARB STEADY) Liqd Take 237 mLs by mouth 2 (two) times daily between meals for 15 days. Start taking on: July 22, 2019   furosemide 80 MG tablet Commonly known as: LASIX Take 80 mg by mouth daily.    gentamicin cream 0.1 % Commonly known as: GARAMYCIN Apply 1 application topically daily.   hydrALAZINE 25 MG tablet Commonly known as: APRESOLINE Take 1 tablet (25 mg total) by mouth 3 (three) times daily.   insulin aspart protamine- aspart (70-30) 100 UNIT/ML injection Commonly known as: NOVOLOG MIX 70/30 Inject 0.25 mLs (25 Units total) into the skin 2 (two) times daily with a meal. What changed: how much to take   isosorbide mononitrate 30 MG 24 hr tablet Commonly known as: IMDUR Take 1 tablet (30 mg total) by mouth daily.   levofloxacin 500 MG tablet Commonly known as: Levaquin Take 1 tablet (500 mg total) by mouth daily for 1 dose. Start taking on: July 22, 2019   mupirocin ointment 2 % Commonly known as: BACTROBAN Place 1 application into the nose 2 (two) times daily.   nitroGLYCERIN 0.4 MG SL tablet Commonly known as: NITROSTAT Place 0.4 mg under the tongue every 5 (five) minutes as needed for chest pain. Pt needs new Rx. Bottle has expried   nystatin 100000 UNIT/ML suspension Commonly known as: MYCOSTATIN Take 5 mLs (500,000 Units total) by mouth 3 (three) times daily for 14 days. Start taking on: July 24, 2019   omeprazole 20 MG capsule Commonly known as: PRILOSEC Take 1 capsule (20 mg total) by mouth daily.   tamsulosin 0.4 MG Caps capsule Commonly known as: Flomax Take 1 capsule (  0.4 mg total) by mouth daily.   traMADol 50 MG tablet Commonly known as: ULTRAM Take 50 mg by mouth every 6 (six) hours as needed.   vancomycin  IVPB Inject 2,000 mg into the vein 2 (two) times a week for 4 doses. Indication:  cellulits Last Day of Therapy:  08/04/19 Please Administer Antibiotic Intraperitoneally Start taking on: July 24, 2019   Vitamin D 50 MCG (2000 UT) tablet Take 2,000 Units by mouth daily. Unsure how many units            Home Infusion Instuctions  (From admission, onward)         Start     Ordered   07/21/19 0000  Home infusion  instructions [facility] May follow [place] Dosing Protocol; May administer Cathflo as needed to maintain patency of vascular access device.; [place]ushing of vascular access device: per [facility] Protocol: 0.9% NaCl pre/post medica...    Question Answer Comment  Instructions May follow [place] Dosing Protocol   Instructions May administer Cathflo as needed to maintain patency of vascular access device.   Instructions [place]ushing of vascular access device: per [facility] Protocol: 0.9% NaCl pre/post medication administration and prn patency; Heparin 100 u/ml, 53ml for implanted ports and Heparin 10u/ml, 62ml for all other central venous catheters.   Instructions May follow [facility] Anaphylaxis Protocol for First Dose Administration in the home: 0.9% NaCl at 25-50 ml/hr to maintain IV access for protocol meds. Epinephrine 0.3 ml IV/IM PRN and Benadryl 25-50 IV/IM PRN s/s of anaphylaxis.   Instructions [facility] Infusion Coordinator (RN) to assist per patient IV care needs in the home PRN.      07/21/19 1520           Durable Medical Equipment  (From admission, onward)         Start     Ordered   07/21/19 1612  For home use only DME Gilford Rile  [facility])  Once    Comments: Need rolling walker, 5"  PAD (peripheral artery disease) ([place]) Active Problems:   Type 2 diabetes mellitus with other diabetic kidney complication ([place])   ESRD (end stage renal disease) ([place])   Anemia in ESRD (end-stage renal disease) ([place])   Other hyperlipidemia   Atherosclerosis of artery of extremity with ulceration ([place])   Type II diabetes mellitus ([place])   Coronary artery disease   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

## 2019-07-21 NOTE — Anesthesia Postprocedure Evaluation (Signed)
Anesthesia Post Note  Patient: Scott [name]  Procedure(s) Performed: AMPUTATION 2nd TOE PARTIAL RAY RESECTION (Left Toe) IRRIGATION AND DEBRIDEMENT FOOT (Left )  Patient location during evaluation: PACU Anesthesia Type: General Level of consciousness: awake and alert Pain management: pain level controlled Vital Signs Assessment: post-procedure vital signs reviewed and stable Respiratory status: spontaneous breathing and respiratory function stable Cardiovascular status: stable Anesthetic complications: no     Last Vitals:  Vitals:   07/21/19 0457 07/21/19 0813  BP: (!) 97/59 (!) 116/40  Pulse: 69 68  Resp: 18 18  Temp: (!) 36.3 C   SpO2: 100% 98%    Last Pain:  Vitals:   07/21/19 0724  TempSrc:   PainSc: 0-No pain                 KEPHART,WILLIAM K

## 2019-07-21 NOTE — Progress Notes (Signed)
Pharmacy Antibiotic Note  Scott [name] is a 58 y.o. male admitted on 07/17/2019 with osteomyelitis. He has ESRD for which he undergoes peritoneal dialysis and receives this treatment overnight. 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 He underwent a successful recanalization of the left lower extremity for limb salvage performed by Dr [name] on 11/17. On 07/19/2019 he was taken for surgery by podiatry and underwent I&D of the infected bone and soft tissue of the left first metatarsal and amputation of left second toe with partial ray resection.  Wound cultures were sent from both  Vancomycin doses: 11/16 1830 1000 mg 11/17 1905 1250 mg 11/19 0807 1000 mg  vancomycin levels  12 mcg/mL 11/17 0522 23 mcg/mL 11/18 0755  18 mcg/mL 11/19 0557 27 mcg/mL 11/20 0615  Goal level < 20 mcg/mL  Plan: 1) vancomycin: random level Is greater than goal level  Hold dose for today  Vancomycin random level in am  2) cefepime 1 gram IV every 24 hours timed to begin after PD sessions have completed overnight  Height: 5\' 9"  (175.3 cm) Weight: 234 lb 12.6 oz (106.5 kg) IBW/kg (Calculated) : 70.7  Temp (24hrs), Avg:97.5 F (36.4 C), Min:97.3 F (36.3 C), Max:97.7 F (36.5 C)  Recent Labs  Lab 07/17/19 1359 07/17/19 1530 07/17/19 1823  07/19/19 0755 07/20/19 0142 07/20/19 0557 07/21/19 0615  WBC 10.6*  --   --   --  10.8* 12.3* 11.0* 12.3*  CREATININE 7.24*  --   --   --  7.10*  --  7.17* 6.89*  LATICACIDVEN  --  1.3 1.4  --   --   --   --   --   VANCORANDOM  --   --   --    < > 23  --  18 27   < > = values in this interval not displayed.    Estimated Creatinine Clearance: 13.7 mL/min (A) (by C-G formula based on SCr of 6.89 mg/dL (H)).    Antimicrobials this admission: 11/16 ceftriaxone >> 11/19 11/16 vancomycin >>  11/20 cefepime >>  Microbiology  results: 11/13 SARS CoV-2: negative  11/18 WCx: pending 11/17 MRSA PCR: negative Thank you for allowing pharmacy to be a part of this patient's care.  Vallery Sa, PharmD Clinical Pharmacist 07/21/2019 8:23 AM

## 2019-07-21 NOTE — Discharge Summary (Addendum)
Physician Discharge Summary  Scott [name] XVQ:[id number] DOB: 05-Mar-1961 DOA: 07/17/2019  PCP: [facility] [place] date: 07/17/2019 Discharge date: 07/21/2019  Recommendations for Outpatient Follow-up:  1. Follow up with PCP in 1 week, and keep all your following up appointments as I made for you. This is very important. 2. Please obtain BMP/CBC in one week  Home Health: [facility], RN, Aid Equipment/Devices: rolling walker, 5"  Discharge Condition: stable CODE STATUS: full Diet recommendation: heart health/carb modified diet  Brief/Interim Summary (HPI)  Subjective  Pt has generalized weakness, no SOB, has mild cough. No CP. No fever or chill. No GI symptoms.   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  Blood culture has no grow so far. Pt was treated with IV antibitotics in hosptial. At discharge, per ID and renal recommendation as below:  - 11/ 20, Per ID, Dr. Delaine Lame, "he can get vanco and ceftazidime thru [facility] got 2 weeks and once we get all the results back we can extend to 4 weeks if needed, and will also need to Also add nystatin 500,000 units orally TID for the duration of antibiotics for fungal prophylaxis -will consult wound care for recommendation of wound care after discharge  - 11/20:  Per renal, Dr. Candiss Norse,  "Final regimen- today cefepime 1 gm iv x 1. saturday = oral Levaquin 500 mg  PO x 1; starting monday= VANC 2 gm IP twice a week and Ceftazime 1 gm IP x 2 weeks. I have conveyed this information to his nurse at the outpatient dialysis department". "Clarification- ceftazidime 1 gm Daily IP for 2 weeks". And "Nystatin 500,000 units orally three times per day for the duration of treatment."  Peripheral arterial disease: Vasc surg consult with angio 11/17 ands/p left leg angiography with intervention. Pt feels better. Left foot is warm. -off IV heparin, started plavix by VVS. -will also start ASA?  End-stage renal disease on peritoneal dialysis: -renal was consulted for dialysis in hospital -Electrolyte monitoring with daily labs  Type 2 diabetes: -treated with sliding scale insulin -continue home 70/30 insulin  CAD -will be on plavix and ASA -Coreg, Imdur, Lasix and hydralazine   Discharge Instructions  Discharge Instructions    Call MD for:  difficulty breathing, headache or visual disturbances   Complete by: As directed    Call MD for:  severe uncontrolled pain   Complete by: As directed    Call MD for:  temperature >100.4   Complete by: As directed    Home infusion instructions [place] May follow [place] Dosing Protocol; May administer Cathflo as needed to maintain patency of vascular access device.; Flushing of vascular access device: per [facility] Protocol: 0.9% NaCl pre/post medica...   Complete by: As directed    Instructions: May follow [place] Dosing Protocol   Instructions: May administer Cathflo as needed to maintain patency of vascular access device.   Instructions:  Flushing of vascular access device: per [facility] Protocol: 0.9% NaCl pre/post medication administration and prn patency; Heparin 100 u/ml, 5ml for implanted ports and Heparin 10u/ml, 59ml for all other central venous catheters.   Instructions: May follow [facility] Anaphylaxis Protocol for First Dose Administration in the home: 0.9% NaCl at 25-50 ml/hr to maintain IV access for  protocol meds. Epinephrine 0.3 ml IV/IM PRN and Benadryl 25-50 IV/IM PRN s/s of anaphylaxis.   Instructions: [facility] Infusion Coordinator (RN) to assist per patient IV care needs in the home PRN.   Increase activity slowly   Complete by: As directed      Allergies as of 07/21/2019   No Known Allergies     Medication List    STOP taking these medications   HYDROcodone-acetaminophen 5-325 MG tablet Commonly known as: NORCO/VICODIN     TAKE these medications   albuterol 108 (90 Base) MCG/ACT inhaler Commonly known as: VENTOLIN HFA Inhale 2 puffs into the lungs every 6 (six) hours as needed for wheezing or shortness of breath.   ascorbic acid 500 MG tablet Commonly known as: VITAMIN C Take 1 tablet (500 mg total) by mouth 2 (two) times daily.   aspirin EC 81 MG tablet Take 1 tablet (81 mg total) by mouth daily.   atorvastatin 40 MG tablet Commonly known as: LIPITOR Take 40 mg by mouth daily.   calcitRIOL 0.25 MCG capsule Commonly known as: ROCALTROL Take 0.25 mcg by mouth daily.   calcium acetate 667 MG capsule Commonly known as: PHOSLO Take 2,001 mg by mouth 3 (three) times daily with meals. Takes 1-2 caps if he has snacks   carvedilol 25 MG tablet Commonly known as: COREG Take 1 tablet (25 mg total) by mouth 2 (two) times daily with a meal.   cefTAZidime  IVPB Commonly known as: FORTAZ Inject 1 g into the vein daily for 14 days. Indication:  cellulitis Last Day of Therapy:  08/07/19 Please Administer this Antibiotic Intraperitoneally Start taking on: July 24, 2019   clopidogrel 75 MG tablet Commonly known as: PLAVIX Take 1 tablet (75 mg total) by mouth daily with breakfast.   Dialyvite 800 0.8 MG Tabs Take 1 tablet by mouth daily.   feeding supplement (NEPRO CARB STEADY) Liqd Take 237 mLs by mouth 2 (two) times daily between meals for 15 days. Start taking on: July 22, 2019   furosemide 80 MG tablet Commonly known as: LASIX Take 80 mg  by mouth daily.   gentamicin cream 0.1 % Commonly known as: GARAMYCIN Apply 1 application topically daily.   hydrALAZINE 25 MG tablet Commonly known as: APRESOLINE Take 1 tablet (25 mg total) by mouth 3 (three) times daily.   insulin aspart protamine- aspart (70-30) 100 UNIT/ML injection Commonly known as: NOVOLOG MIX 70/30 Inject 0.25 mLs (25 Units total) into the skin 2 (two) times daily with a meal. What changed: how much to take   isosorbide mononitrate 30 MG 24 hr tablet Commonly known as: IMDUR Take 1 tablet (30 mg total) by mouth daily.   levofloxacin 500 MG tablet Commonly known as: Levaquin Take 1 tablet (500 mg total) by mouth daily for 1 dose. Start taking on: July 22, 2019   mupirocin ointment 2 % Commonly known as: BACTROBAN Place 1 application into the nose 2 (two) times daily.   nitroGLYCERIN 0.4 MG SL tablet Commonly known as: NITROSTAT Place 0.4 mg under the tongue every 5 (five) minutes as needed for chest pain. Pt needs new Rx. Bottle  has expried   nystatin 100000 UNIT/ML suspension Commonly known as: MYCOSTATIN Take 5 mLs (500,000 Units total) by mouth 3 (three) times daily for 14 days. Start taking on: July 24, 2019   omeprazole 20 MG capsule Commonly known as: PRILOSEC Take 1 capsule (20 mg total) by mouth daily.   tamsulosin 0.4 MG Caps capsule Commonly known as: Flomax Take 1 capsule (0.4 mg total) by mouth daily.   traMADol 50 MG tablet Commonly known as: ULTRAM Take 50 mg by mouth every 6 (six) hours as needed.   vancomycin  IVPB Inject 2,000 mg into the vein 2 (two) times a week for 4 doses. Indication:  cellulits Last Day of Therapy:  08/04/19 Please Administer Antibiotic Intraperitoneally Start taking on: July 24, 2019   Vitamin D 50 MCG (2000 UT) tablet Take 2,000 Units by mouth daily. Unsure how many units            Home Infusion Instuctions  (From admission, onward)         Start     Ordered   07/21/19  0000  Home infusion instructions [facility] May follow [place] Dosing Protocol; May administer Cathflo as needed to maintain patency of vascular access device.; Flushing of vascular access device: per [facility] Protocol: 0.9% NaCl pre/post medica...    Question Answer Comment  Instructions May follow [place] Dosing Protocol   Instructions May administer Cathflo as needed to maintain patency of vascular access device.   Instructions Flushing of vascular access device: per [facility] Protocol: 0.9% NaCl pre/post medication administration and prn patency; Heparin 100 u/ml, 45ml for implanted ports and Heparin 10u/ml, 6ml for all other central venous catheters.   Instructions May follow [facility] Anaphylaxis Protocol for First Dose Administration in the home: 0.9% NaCl at 25-50 ml/hr to maintain IV access for protocol meds. Epinephrine 0.3 ml IV/IM PRN and Benadryl 25-50 IV/IM PRN s/s of anaphylaxis.   Instructions [facility] Infusion Coordinator (RN) to assist per patient IV care needs in the home PRN.      07/21/19 1520           Durable Medical Equipment  (From admission, onward)         Start     Ordered   07/21/19 1612  For home use only DME Gilford Rile  [facility])  Once    Comments: Need rolling walker, 5"  PAD (peripheral artery disease) ([place]) Active Problems:   Type 2 diabetes mellitus with other diabetic kidney complication ([place])   ESRD (end stage renal disease) ([place])   Anemia in ESRD (end-stage renal disease) ([place])   Other hyperlipidemia   Atherosclerosis of artery of extremity with ulceration ([place])   Type II diabetes mellitus ([place])   Coronary artery disease   Osteomyelitis of second toe of left foot (HCC)   Ulcerated, foot, right, with necrosis of muscle ([place])  Question:  Patient needs a walker to treat with the following condition  Answer:  1+ pitting edema   07/21/19 [place], [employer]. Schedule an appointment as soon  as possible for a visit in 1 week(s).   Why: hospital follow up appointment 3-5 days after discharge  Contact information: [place] [address] [phone]        Kate Sable, MD. Go on 08/08/2019.   Specialties: Cardiology, Radiology Why: Go at 9:00am. Contact information: [place] [address]  [place], MD Follow up in 4 day(s).   Specialty: Nephrology Contact information: [place] [address] [phone]        Tsosie Billing, MD Follow up in 7 day(s).   Specialty: Infectious Diseases Contact information: [place] [address] [phone]        Sharlotte Alamo, DPM. Go on 07/24/2019.   Specialty: Podiatry Why: Go at 11:45am. Contact information: [place] [address] [phone]        Schnier, [name], MD. Go on 08/07/2019.   Specialties: Vascular Surgery, Cardiology, Radiology, Vascular Surgery Why: Go at 11:00am. Contact information: [place] [address] [phone]          No Known Allergies  Consultations:  VVS  Renal  Surgeon, podiatrist  ID   Procedures/Studies:  Procedures: -11/17: s/p left leg angiography with intervention -11/18: had procedure: 1. His incision with debridement of infected bone and soft tissue left first metatarsal. 2. Amputation left second toe with partial ray resection. - 11/17: Procedure(s) Performed: 1. Introduction catheter intoleftlower extremity 3rd order catheter placement  2.Contrast injection leftlower extremity for distal runoff with additional 3rd order  3. Retrieval portion of CXI catheter from left SFA with a trilobed snare 4. Percutaneous transluminal angioplastyand stent placement left anterior tibial  5. Thromboembolectomy with the penumbra  cat 6 device left anterior tibial 6.Star close closurerightcommon femoral arteriotomy  Nm Myocar Multi W/spect W/wall Motion / Ef  Result Date: 07/12/2019  There was no ST segment deviation noted during stress.  There is a medium defect of severe severity present in the apical anterior, apical septal, apical inferior and apex location.  Findings consistent with prior myocardial infarction.  This is an intermediate risk study.  The left ventricular ejection fraction is severely decreased (<30%).    Dg Foot 2 Views Left  Result Date: 07/17/2019 CLINICAL DATA:  Nonhealing wound of the left foot with perianal and drainage. History of partial amputation. EXAM: LEFT FOOT - 2 VIEW COMPARISON:  Radiographs 05/24/2019 and 05/22/2019. FINDINGS: Stable postsurgical changes following amputation through the base of the 1st metatarsal. There is new poor cortical definition of the 2nd metatarsal head. No progressive destruction of the 1st metatarsal remnant identified. There is a stable prominent plantar calcaneal spur and prominent diffuse vascular calcifications. No unexpected foreign bodies. IMPRESSION: 1. New poor cortical definition of the 2nd metatarsal head suspicious for osteomyelitis. 2. Stable postsurgical changes following amputation through the base of the 1st metatarsal. Electronically Signed   By: Richardean Sale M.D.   On: 07/17/2019 14:38   Vas [place] Lower Extremity Arterial Duplex  Result Date: 07/03/2019 LOWER EXTREMITY ARTERIAL DUPLEX STUDY High Risk Factors: Hypertension.  Vascular Interventions: 05/23/2019 PTA of Lt ATA and dorsalis pedis artery. PTA. Current ABI:            Not obtained Comparison Study: 06/08/2019 Performing Technologist: Almira Coaster RVS  Examination Guidelines: A complete evaluation includes B-mode imaging, spectral Doppler, color Doppler, and power Doppler as needed of all accessible portions of each vessel. Bilateral testing is considered an integral  part of a complete examination. Limited examinations for reoccurring indications may be performed as noted.  +-----------+--------+-----+--------+----------+--------+ RIGHT      PSV cm/sRatioStenosisWaveform  Comments +-----------+--------+-----+--------+----------+--------+ CFA Distal 102                  monophasic         +-----------+--------+-----+--------+----------+--------+  DFA        123                  monophasic         +-----------+--------+-----+--------+----------+--------+ SFA Prox   84                   monophasic         +-----------+--------+-----+--------+----------+--------+ SFA Mid    61                   monophasic         +-----------+--------+-----+--------+----------+--------+ SFA Distal 109                  monophasic         +-----------+--------+-----+--------+----------+--------+ POP Distal 58                   monophasic         +-----------+--------+-----+--------+----------+--------+ ATA Distal 0                    Occluded           +-----------+--------+-----+--------+----------+--------+ PTA Distal 30                   monophasic         +-----------+--------+-----+--------+----------+--------+ PERO Distal28                   monophasic         +-----------+--------+-----+--------+----------+--------+  +-----------+--------+-----+--------+----------+--------+ LEFT       PSV cm/sRatioStenosisWaveform  Comments +-----------+--------+-----+--------+----------+--------+ CFA Distal 95                   monophasic         +-----------+--------+-----+--------+----------+--------+ DFA        28                   monophasic         +-----------+--------+-----+--------+----------+--------+ SFA Prox   96                   monophasic         +-----------+--------+-----+--------+----------+--------+ SFA Mid    117                  monophasic          +-----------+--------+-----+--------+----------+--------+ SFA Distal 70                   monophasic         +-----------+--------+-----+--------+----------+--------+ POP Distal 60                   monophasic         +-----------+--------+-----+--------+----------+--------+ ATA Distal 105                  monophasic         +-----------+--------+-----+--------+----------+--------+ PTA Distal 0                    Occluded           +-----------+--------+-----+--------+----------+--------+ PERO Distal30                   monophasic         +-----------+--------+-----+--------+----------+--------+  Summary: Right: Imaging and Waveforms obtained throughout in the Right Lower Extremity. Monophasic flow seen Predominantly in the Right Lower Extremity; No flow seen in the Right ATA. Left: Imaging and Waveforms obtained throughout in  the Left Lower Extremity. Monophasic flow seen Predominantly in the Left Lower Extremity; No flow seen in the Left PTA. Blood Pressure cannot be obtained in the Left>250.   See table(s) above for measurements and observations. Electronically signed by Hortencia Pilar MD on 07/03/2019 at 5:29:03 PM.    Final       Discharge Exam: Vitals:   07/21/19 1246 07/21/19 1547  BP: (!) 120/52 (!) 125/54  Pulse: 74 69  Resp: 18 16  Temp: (!) 97.4 F (36.3 C) (!) 97.5 F (36.4 C)  SpO2: 100% 97%   Vitals:   07/21/19 0457 07/21/19 0813 07/21/19 1246 07/21/19 1547  BP: (!) 97/59 (!) 116/40 (!) 120/52 (!) 125/54  Pulse: 69 68 74 69  Resp: 18 18 18 16   Temp: (!) 97.3 F (36.3 C)  (!) 97.4 F (36.3 C) (!) 97.5 F (36.4 C)  TempSrc: Oral  Oral Oral  SpO2: 100% 98% 100% 97%  Weight: 106.5 kg     Height:        General: Pt is alert, awake, not in acute distress Cardiovascular: RRR, S1/S2 +, no rubs, no gallops Respiratory: CTA bilaterally, no wheezing, no rhonchi Abdominal: Soft, NT, ND, bowel sounds + Extremities:  s/p of amputation left second  toe with partial ray resection. S/p of amputation of left great toe. Left foot is warm.     The results of significant diagnostics from this hospitalization (including imaging, microbiology, ancillary and laboratory) are listed below for reference.     Microbiology: Recent Results (from the past 240 hour(s))  SARS CORONAVIRUS 2 (TAT 6-24 HRS) Nasopharyngeal Nasopharyngeal Swab     Status: None   Collection Time: 07/14/19  1:11 PM   Specimen: Nasopharyngeal Swab  Result Value Ref Range Status   SARS Coronavirus 2 NEGATIVE NEGATIVE Final    Comment: (NOTE) SARS-CoV-2 target nucleic acids are NOT DETECTED. The SARS-CoV-2 RNA is generally detectable in upper and lower respiratory specimens during the acute phase of infection. Negative results do not preclude SARS-CoV-2 infection, do not rule out co-infections with other pathogens, and should not be used as the sole basis for treatment or other patient management decisions. Negative results must be combined with clinical observations, patient history, and epidemiological information. The expected result is Negative. Fact Sheet for Patients: SugarRoll.be Fact Sheet for Healthcare Providers: https://www.woods-mathews.com/ This test is not yet approved or cleared by the [place] FDA and  has been authorized for detection and/or diagnosis of SARS-CoV-2 by FDA under an Emergency Use Authorization (EUA). This EUA will remain  in effect (meaning this test can be used) for the duration of the COVID-19 declaration under Section 56 4(b)(1) of the Act, 21 U.S.C. section 360bbb-3(b)(1), unless the authorization is terminated or revoked sooner. Performed at [facility], [place] [address] [place], [place] [address]   Surgical PCR screen     Status: None   Collection Time: 07/18/19  6:35 AM   Specimen: Nasal Mucosa; Nasal Swab  Result Value Ref Range Status   MRSA, PCR NEGATIVE NEGATIVE Final    Staphylococcus aureus NEGATIVE NEGATIVE Final    Comment: (NOTE) The Xpert SA Assay (FDA approved for NASAL specimens in patients 60 years of age and older), is one component of a comprehensive surveillance program. It is not intended to diagnose infection nor to guide or monitor treatment. Performed at [facility], [address] [place], [place] [address]   Aerobic/Anaerobic Culture (surgical/deep wound)     Status: None (Preliminary  result)   Collection Time: 07/19/19  5:11 PM   Specimen: Wound; Tissue  Result Value Ref Range Status   Specimen Description TISSUE  Final   Special Requests LEFT 1ST METATARSAL  Final   Gram Stain   Final    MODERATE WBC PRESENT, PREDOMINANTLY MONONUCLEAR NO ORGANISMS SEEN Performed at [facility], [address] [place], [place] [address]    Culture   Final    CULTURE REINCUBATED FOR BETTER GROWTH NO ANAEROBES ISOLATED; CULTURE IN PROGRESS FOR 5 DAYS    Report Status PENDING  Incomplete  Aerobic/Anaerobic Culture (surgical/deep wound)     Status: None (Preliminary result)   Collection Time: 07/19/19  5:15 PM   Specimen: Wound; Tissue  Result Value Ref Range Status   Specimen Description TISSUE  Final   Special Requests SAMPLE B ,2ND METATARSAL BONE  Final   Gram Stain   Final    RARE WBC PRESENT, PREDOMINANTLY MONONUCLEAR NO ORGANISMS SEEN Performed at [facility], [address] [place], [place] [address]    Culture   Final    CULTURE REINCUBATED FOR BETTER GROWTH NO ANAEROBES ISOLATED; CULTURE IN PROGRESS FOR 5 DAYS    Report Status PENDING  Incomplete     Labs: BNP (last 3 results) Recent Labs    09/17/18 2156  BNP 357.0*   Basic Metabolic Panel: Recent Labs  Lab 07/17/19 1359 07/18/19 0857 07/19/19 0755 07/20/19 0557 07/21/19 0615  NA 135  --  135 135 136  K 4.7 4.7 4.5 4.4 4.0  CL 98  --  93* 99 98  CO2 22  --  23 23 24   GLUCOSE 128*  --  116* 204* 190*  BUN 47*  --  49* 47* 49*   CREATININE 7.24*  --  7.10* 7.17* 6.89*  CALCIUM 8.1*  --  7.3* 7.1* 7.3*  PHOS  --   --   --   --  5.1*   Liver Function Tests: Recent Labs  Lab 07/21/19 0615  ALBUMIN 1.9*   No results for input(s): LIPASE, AMYLASE in the last 168 hours. No results for input(s): AMMONIA in the last 168 hours. CBC: Recent Labs  Lab 07/17/19 1359 07/19/19 0755 07/20/19 0142 07/20/19 0557 07/21/19 0615  WBC 10.6* 10.8* 12.3* 11.0* 12.3*  NEUTROABS 8.5*  --   --   --   --   HGB 9.6* 8.0* 8.1* 7.5* 7.7*  HCT 30.4* 24.4* 25.7* 23.8* 23.0*  MCV 94.7 92.1 94.8 94.8 91.3  PLT 361 285 318 292 301   Cardiac Enzymes: Recent Labs  Lab 07/18/19 0522  CKTOTAL 88   BNP: Invalid input(s): POCBNP CBG: Recent Labs  Lab 07/21/19 0004 07/21/19 0547 07/21/19 0817 07/21/19 1150 07/21/19 1229  GLUCAP 128* 175* 140* 48* 105*   D-Dimer No results for input(s): DDIMER in the last 72 hours. Hgb A1c No results for input(s): HGBA1C in the last 72 hours. Lipid Profile No results for input(s): CHOL, HDL, LDLCALC, TRIG, CHOLHDL, LDLDIRECT in the last 72 hours. Thyroid function studies No results for input(s): TSH, T4TOTAL, T3FREE, THYROIDAB in the last 72 hours.  Invalid input(s): FREET3 Anemia work up No results for input(s): VITAMINB12, FOLATE, FERRITIN, TIBC, IRON, RETICCTPCT in the last 72 hours. Urinalysis    Component Value Date/Time   COLORURINE YELLOW 06/19/2019 1202   APPEARANCEUR CLEAR 06/19/2019 1202   APPEARANCEUR Clear 12/03/2014 0751   LABSPEC 1.020 06/19/2019 1202   LABSPEC 1.011 12/03/2014 0751   PHURINE 6.0  06/19/2019 1202   GLUCOSEU 100 (A) 06/19/2019 1202   GLUCOSEU 50 mg/dL 12/03/2014 0751   HGBUR MODERATE (A) 06/19/2019 1202   BILIRUBINUR NEGATIVE 06/19/2019 1202   BILIRUBINUR Negative 12/03/2014 0751   KETONESUR NEGATIVE 06/19/2019 1202   PROTEINUR 100 (A) 06/19/2019 1202   NITRITE NEGATIVE 06/19/2019 1202   LEUKOCYTESUR NEGATIVE 06/19/2019 1202   LEUKOCYTESUR  Negative 12/03/2014 0751   Sepsis Labs Invalid input(s): PROCALCITONIN,  WBC,  LACTICIDVEN Microbiology Recent Results (from the past 240 hour(s))  SARS CORONAVIRUS 2 (TAT 6-24 HRS) Nasopharyngeal Nasopharyngeal Swab     Status: None   Collection Time: 07/14/19  1:11 PM   Specimen: Nasopharyngeal Swab  Result Value Ref Range Status   SARS Coronavirus 2 NEGATIVE NEGATIVE Final    Comment: (NOTE) SARS-CoV-2 target nucleic acids are NOT DETECTED. The SARS-CoV-2 RNA is generally detectable in upper and lower respiratory specimens during the acute phase of infection. Negative results do not preclude SARS-CoV-2 infection, do not rule out co-infections with other pathogens, and should not be used as the sole basis for treatment or other patient management decisions. Negative results must be combined with clinical observations, patient history, and epidemiological information. The expected result is Negative. Fact Sheet for Patients: SugarRoll.be Fact Sheet for Healthcare Providers: https://www.woods-mathews.com/ This test is not yet approved or cleared by the [place] FDA and  has been authorized for detection and/or diagnosis of SARS-CoV-2 by FDA under an Emergency Use Authorization (EUA). This EUA will remain  in effect (meaning this test can be used) for the duration of the COVID-19 declaration under Section 56 4(b)(1) of the Act, 21 U.S.C. section 360bbb-3(b)(1), unless the authorization is terminated or revoked sooner. Performed at [facility], [place] [facility] [address] [place], [place] [address]   Surgical PCR screen     Status: None   Collection Time: 07/18/19  6:35 AM   Specimen: Nasal Mucosa; Nasal Swab  Result Value Ref Range Status   MRSA, PCR NEGATIVE NEGATIVE Final   Staphylococcus aureus NEGATIVE NEGATIVE Final    Comment: (NOTE) The Xpert SA Assay (FDA approved for NASAL specimens in patients 48 years of age and older), is  one component of a comprehensive surveillance program. It is not intended to diagnose infection nor to guide or monitor treatment. Performed at [facility], [address] [place], [place] [address]   Aerobic/Anaerobic Culture (surgical/deep wound)     Status: None (Preliminary result)   Collection Time: 07/19/19  5:11 PM   Specimen: Wound; Tissue  Result Value Ref Range Status   Specimen Description TISSUE  Final   Special Requests LEFT 1ST METATARSAL  Final   Gram Stain   Final    MODERATE WBC PRESENT, PREDOMINANTLY MONONUCLEAR NO ORGANISMS SEEN Performed at [facility], [address] [place], [place] [address]    Culture   Final    CULTURE REINCUBATED FOR BETTER GROWTH NO ANAEROBES ISOLATED; CULTURE IN PROGRESS FOR 5 DAYS    Report Status PENDING  Incomplete  Aerobic/Anaerobic Culture (surgical/deep wound)     Status: None (Preliminary result)   Collection Time: 07/19/19  5:15 PM   Specimen: Wound; Tissue  Result Value Ref Range Status   Specimen Description TISSUE  Final   Special Requests SAMPLE B ,2ND METATARSAL BONE  Final   Gram Stain   Final    RARE WBC PRESENT, PREDOMINANTLY MONONUCLEAR NO ORGANISMS SEEN Performed at [facility], [address], [place], [place] [address]  Culture   Final    CULTURE REINCUBATED FOR BETTER GROWTH NO ANAEROBES ISOLATED; CULTURE IN PROGRESS FOR 5 DAYS    Report Status PENDING  Incomplete    Time coordinating discharge:  35 minutes.   SIGNED:  Ivor Costa, DO [facility] 07/21/2019, 4:14 PM Pager is on [place]  If 7PM-7AM, please contact night-coverage [email or web address] Password [email or web address]

## 2019-07-21 NOTE — Progress Notes (Signed)
Physical Therapy Treatment Patient Details Name: Scott [name] MRN: [id number] DOB: October 22, 1960 Today's Date: 07/21/2019    History of Present Illness Scott [name] is a 37yoM who comes to [facility] with Left foot pain.Pt is s/p left partial 1st ray amputation (sept 2020) after worsening left great toe pain, warmness and gangrene for 1 week. PMH include CHF, CKD, CAD, PVD, HTN, DM, continuous ambulatory peritoneal dialysis status    PT Comments    Pt at EOB upon entry, agreeable to treatment. Author reviewing WB status and precautions. Pt educated on basic HEP to maintain LLE strength and work on balance at home in LLE NWB.Pt doing well in general.No pain or fatigue limitations. Pt ready for DC from PT standpoint. Pt remains weak, but attempts use of BUE to offset LLE weight bearing.     Follow Up Recommendations  Home health PT     Equipment Recommendations  Rolling walker with 5" wheels    Recommendations for Other Services       Precautions / Restrictions Precautions Precautions: Fall Restrictions LLE Weight Bearing: Non weight bearing Other Position/Activity Restrictions: Podiatry allowing for heel weightbearing as needed for mobility to BR.    Mobility  Bed Mobility               General bed mobility comments: Recived at EOB playing solitaire  Transfers Overall transfer level: Needs assistance Equipment used: Rolling walker (2 wheeled) Transfers: Sit to/from Stand Sit to Stand: Supervision;From elevated surface         General transfer comment: 2x5, elevated surface, use of RW, cued to minimal use of LLE as much as possible, which he does well to TDWB durign transfers.  Ambulation/Gait Ambulation/Gait assistance: (deferred)               Stairs             Wheelchair Mobility    Modified Rankin (Stroke Patients Only)       Balance                                            Cognition Arousal/Alertness:  Awake/alert Behavior During Therapy: WFL for tasks assessed/performed Overall Cognitive Status: Within Functional Limits for tasks assessed                                        Exercises Other Exercises Other Exercises: Seated LLE LAQ 1x15; Seated LLE brace marching 1x15 Other Exercises: Standing LLE extension 1x15; standing LLE abdct 1x15; standing LLE high knee marching- (RW for balance for all of these) Other Exercises: HEP handout issued at end of session    General Comments        Pertinent Vitals/Pain Pain Assessment: No/denies pain    Home Living                      Prior Function            PT Goals (current goals can now be found in the care plan section) Acute Rehab PT Goals Patient Stated Goal: acquire a kneleing sccoter and use this for mobility needs. PT Goal Formulation: With patient Time For Goal Achievement: 08/03/19 Potential to Achieve Goals: Fair Progress towards PT goals: Progressing toward goals    Frequency  7X/week      PT Plan Current plan remains appropriate    Co-evaluation              AM-PAC PT "6 Clicks" Mobility   Outcome Measure  Help needed turning from your back to your side while in a flat bed without using bedrails?: A Little Help needed moving from lying on your back to sitting on the side of a flat bed without using bedrails?: A Little Help needed moving to and from a bed to a chair (including a wheelchair)?: A Little Help needed standing up from a chair using your arms (e.g., wheelchair or bedside chair)?: A Little Help needed to walk in hospital room?: A Little Help needed climbing 3-5 steps with a railing? : A Little 6 Click Score: 18    End of Session Equipment Utilized During Treatment: Gait belt Activity Tolerance: Patient tolerated treatment well;No increased pain;Patient limited by fatigue Patient left: in bed;with nursing/sitter in room;with call bell/phone within reach Nurse  Communication: Mobility status PT Visit Diagnosis: Unsteadiness on feet (R26.81);Muscle weakness (generalized) (M62.81);Difficulty in walking, not elsewhere classified (R26.2);Other abnormalities of gait and mobility (R26.89)     Time: 3888-2800 PT Time Calculation (min) (ACUTE ONLY): 24 min  Charges:  $Therapeutic Exercise: 23-37 mins                     3:43 PM, 07/21/19 Scott [name], PT, DPT Physical Therapist - [facility]  [phone] ([place])    Scott [name] 07/21/2019, 3:38 PM

## 2019-07-21 NOTE — Progress Notes (Signed)
[facility], [place] 07/21/19  Subjective:   LOS: 4 11/19 0701 - 11/20 0700 In: 842.3 [P.O.:480; I.V.:262.3; IV Piggyback:100] Out: -  Patient known to our practice from outpatient dialysis.  This time he is admitted for necrotic wound on the left foot with exposure of the bone.  Diagnosed with osteomyelitis.  Angiogram shows peripheral vascular disease with gangrenous changes to the left second toe and forefoot with nonhealing previous first ray amputation. Nephrology consult requested for continuation of peritoneal dialysis Patient denies any nausea or vomiting.  No shortness of breath Ultrafiltration from peritoneal dialysis was -50 cc Underwent amputation second toe partial ray resection of the left foot with irrigation and debridement.   Objective:  Vital signs in last 24 hours:  Temp:  [97.3 F (36.3 C)-97.7 F (36.5 C)] 97.4 F (36.3 C) (11/20 1246) Pulse Rate:  [68-74] 74 (11/20 1246) Resp:  [18] 18 (11/20 1246) BP: (97-120)/(40-59) 120/52 (11/20 1246) SpO2:  [98 %-100 %] 100 % (11/20 1246) Weight:  [106.5 kg] 106.5 kg (11/20 0457)  Weight change: 6.9 kg Filed Weights   07/19/19 0500 07/20/19 0500 07/21/19 0457  Weight: 90.5 kg 99.6 kg 106.5 kg    Intake/Output:    Intake/Output Summary (Last 24 hours) at 07/21/2019 1521 Last data filed at 07/21/2019 1506 Gross per 24 hour  Intake 8586 ml  Output 47 ml  Net 8539 ml     Physical Exam: General:  No acute distress, sitting up in the bed  HEENT  moist oral mucous membranes  Pulm/lungs  normal breathing effort, clear to auscultation  CVS/Heart  no rub or gallop  Abdomen:   Soft, nontender  Extremities:  Left foot is bandaged, trace edema  Neurologic:  Alert, oriented  Skin:  Congestive changes on the legs  Access:  Right arm AV fistula, PD catheter in place       Basic Metabolic Panel:  Recent Labs  Lab 07/17/19 1359 07/18/19 0857 07/19/19 0755 07/20/19 0557  07/21/19 0615  NA 135  --  135 135 136  K 4.7 4.7 4.5 4.4 4.0  CL 98  --  93* 99 98  CO2 22  --  23 23 24   GLUCOSE 128*  --  116* 204* 190*  BUN 47*  --  49* 47* 49*  CREATININE 7.24*  --  7.10* 7.17* 6.89*  CALCIUM 8.1*  --  7.3* 7.1* 7.3*  PHOS  --   --   --   --  5.1*     CBC: Recent Labs  Lab 07/17/19 1359 07/19/19 0755 07/20/19 0142 07/20/19 0557 07/21/19 0615  WBC 10.6* 10.8* 12.3* 11.0* 12.3*  NEUTROABS 8.5*  --   --   --   --   HGB 9.6* 8.0* 8.1* 7.5* 7.7*  HCT 30.4* 24.4* 25.7* 23.8* 23.0*  MCV 94.7 92.1 94.8 94.8 91.3  PLT 361 285 318 292 301      Lab Results  Component Value Date   HEPBSAG NON REACTIVE 07/21/2019   HEPBSAB Non Reactive 01/18/2017   HEPBIGM Negative 01/18/2017      Microbiology:  Recent Results (from the past 240 hour(s))  SARS CORONAVIRUS 2 (TAT 6-24 HRS) Nasopharyngeal Nasopharyngeal Swab     Status: None   Collection Time: 07/14/19  1:11 PM   Specimen: Nasopharyngeal Swab  Result Value Ref Range Status   SARS Coronavirus 2 NEGATIVE NEGATIVE Final    Comment: (NOTE) SARS-CoV-2 target nucleic acids are NOT DETECTED. The SARS-CoV-2 RNA is generally  detectable in upper and lower respiratory specimens during the acute phase of infection. Negative results do not preclude SARS-CoV-2 infection, do not rule out co-infections with other pathogens, and should not be used as the sole basis for treatment or other patient management decisions. Negative results must be combined with clinical observations, patient history, and epidemiological information. The expected result is Negative. Fact Sheet for Patients: SugarRoll.be Fact Sheet for Healthcare Providers: https://www.woods-mathews.com/ This test is not yet approved or cleared by the [place] FDA and  has been authorized for detection and/or diagnosis of SARS-CoV-2 by FDA under an Emergency Use Authorization (EUA). This EUA will remain  in  effect (meaning this test can be used) for the duration of the COVID-19 declaration under Section 56 4(b)(1) of the Act, 21 U.S.C. section 360bbb-3(b)(1), unless the authorization is terminated or revoked sooner. Performed at [facility], [place] [address], [place], [place] [address]   Surgical PCR screen     Status: None   Collection Time: 07/18/19  6:35 AM   Specimen: Nasal Mucosa; Nasal Swab  Result Value Ref Range Status   MRSA, PCR NEGATIVE NEGATIVE Final   Staphylococcus aureus NEGATIVE NEGATIVE Final    Comment: (NOTE) The Xpert SA Assay (FDA approved for NASAL specimens in patients 98 years of age and older), is one component of a comprehensive surveillance program. It is not intended to diagnose infection nor to guide or monitor treatment. Performed at [facility], [address] [place], [place] [address]   Aerobic/Anaerobic Culture (surgical/deep wound)     Status: None (Preliminary result)   Collection Time: 07/19/19  5:11 PM   Specimen: Wound; Tissue  Result Value Ref Range Status   Specimen Description TISSUE  Final   Special Requests LEFT 1ST METATARSAL  Final   Gram Stain   Final    MODERATE WBC PRESENT, PREDOMINANTLY MONONUCLEAR NO ORGANISMS SEEN    Culture   Final    CULTURE REINCUBATED FOR BETTER GROWTH Performed at [facility], [place] [address], [place], [place] [address]    Report Status PENDING  Incomplete  Aerobic/Anaerobic Culture (surgical/deep wound)     Status: None (Preliminary result)   Collection Time: 07/19/19  5:15 PM   Specimen: Wound; Tissue  Result Value Ref Range Status   Specimen Description TISSUE  Final   Special Requests SAMPLE B ,2ND METATARSAL BONE  Final   Gram Stain   Final    RARE WBC PRESENT, PREDOMINANTLY MONONUCLEAR NO ORGANISMS SEEN    Culture   Final    CULTURE REINCUBATED FOR BETTER GROWTH Performed at [facility], [address], [place], [place] [address]    Report Status PENDING   Incomplete    Coagulation Studies: No results for input(s): LABPROT, INR in the last 72 hours.  Urinalysis: No results for input(s): COLORURINE, LABSPEC, PHURINE, GLUCOSEU, HGBUR, BILIRUBINUR, KETONESUR, PROTEINUR, UROBILINOGEN, NITRITE, LEUKOCYTESUR in the last 72 hours.  Invalid input(s): APPERANCEUR    Imaging: No results found.   Medications:   . sodium chloride    . sodium chloride    . ceFEPime (MAXIPIME) IV Stopped (07/21/19 0902)  . dialysis solution 1.5% low-MG/low-CA     . atorvastatin  40 mg Oral Daily  . calcitRIOL  0.25 mcg Oral Daily  . calcium acetate  2,001 mg Oral TID WC  . carvedilol  25 mg Oral BID WC  . cholecalciferol  2,000 Units Oral Daily  . clopidogrel  75 mg Oral Daily  .  epoetin (EPOGEN/PROCRIT) injection  20,000 Units Subcutaneous Once  . feeding supplement (NEPRO CARB STEADY)  237 mL Oral BID BM  . furosemide  80 mg Oral Daily  . gentamicin cream  1 application Topical Daily  . heparin injection (subcutaneous)  5,000 Units Subcutaneous Q8H  . hydrALAZINE  25 mg Oral TID  . insulin aspart  0-9 Units Subcutaneous TID AC & HS  . isosorbide mononitrate  30 mg Oral Daily  . multivitamin  1 tablet Oral Daily  . mupirocin ointment  1 application Nasal BID  . nystatin  5 mL Oral TID  . sodium chloride flush  3 mL Intravenous Q12H  . sodium chloride flush  3 mL Intravenous Q12H  . tamsulosin  0.4 mg Oral Daily  . vancomycin variable dose per unstable renal function (pharmacist dosing)   Does not apply See admin instructions  . vitamin C  500 mg Oral BID   sodium chloride, sodium chloride, albuterol, alum & mag hydroxide-simeth, calcium acetate, heparin, HYDROcodone-acetaminophen, morphine injection, nitroGLYCERIN, ondansetron (ZOFRAN) IV, sodium chloride flush, sodium chloride flush  Assessment/ Plan:  58 y.o. male with  end stage renal disease on peritoneal dialysis, congestive heart failure, GERD, hypertension, peripheral vascular disease,  diabetes mellitus type 2, diabetic retinopathy, diabetic gastroparesis, admitted with gangrene of left first toe.  [facility]D/[address] CAPD 1833mL fills 4 exchanges  Principal Problem:   PAD (peripheral artery disease) (HCC) Active Problems:   Type 2 diabetes mellitus with other diabetic kidney complication (HCC)   ESRD (end stage renal disease) ([place])   Anemia in ESRD (end-stage renal disease) ([place])   Other hyperlipidemia   Atherosclerosis of artery of extremity with ulceration (HCC)   Type II diabetes mellitus ([place])   Coronary artery disease   Osteomyelitis of second toe of left foot (HCC)   Ulcerated, foot, right, with necrosis of muscle ([place])   #. ESRD. On CAPD  Continue CCPD while in the hospital. In-hospital prescription of 4 cycles of 2000 cc.  1.5%.  #. Anemia of CKD  Lab Results  Component Value Date   HGB 7.7 (L) 07/21/2019  Given SQ EPO this admission  #.  Secondary hyperparathyroidism    Component Value Date/Time   PTH 164 (H) 09/18/2018 1439   Lab Results  Component Value Date   PHOS 5.1 (H) 07/21/2019  continue home dose of PhosLo   #. Diabetes type 2 with CKD Hemoglobin A1C (no units)  Date Value  09/17/2015 9.1   Hgb A1c MFr Bld (%)  Date Value  07/17/2019 6.0 (H)   # Osteomyelitis of left foot with necrosis and osteomyelitis of first metatarsal, gangrenous changes with deep tissue ulceration  - underwent amputation second toe partial ray resection left, irrigation and debridement of left foot on 11/19 -Plan for antibiotics: Vancomycin 2 g intraperitoneal-twice weekly, ceftazidime 1 g intraperitoneal every 24 hours, for a total of 2 weeks.  Nystatin 500,000 units liquid 3 times a day for prevention of fungal peritonitis Discussed Abx regimen with outpatient dialysis nurse, ID Dr Ramon Dredge, Dr Juleen China (prim Nephrologist), Pharmacist Patient will be trained to do these antibiotics at home starting Monday Over the weekend he will be able to  take Levaquin 500 mg p.o. x1.  His vancomycin level is 27 today.  He should be able to wait till Monday for his next dose.  #Right hydroureteronephrosis -Following with urologist Dr. Erlene Quan -cystoscopy is planned as outpatient   LOS: [place] 11/20/20203:21 PM  [place], [place]  [phone] 

## 2019-07-21 NOTE — Progress Notes (Signed)
Hypoglycemic Event  CBG: 48  Treatment: 1/2 amp of D50  Symptoms: none  Follow-up CBG: Time:1129 CBG Result:105  Possible Reasons for Event: indequate meal intake    Scott [name]

## 2019-07-21 NOTE — Progress Notes (Signed)
Date of Admission:  07/17/2019     ID: Scott [name] is a 58 y.o. male Principal Problem:   PAD (peripheral artery disease) ([place]) Active Problems:   Type 2 diabetes mellitus with other diabetic kidney complication (HCC)   ESRD (end stage renal disease) ([place])   Anemia in ESRD (end-stage renal disease) ([place])   Other hyperlipidemia   Atherosclerosis of artery of extremity with ulceration (HCC)   Type II diabetes mellitus ([place])   Coronary artery disease   Osteomyelitis of second toe of left foot (HCC)   Ulcerated, foot, right, with necrosis of muscle (HCC)    Subjective: Patient seen with Dr. Caryl Comes.  Stable.  No fever. No pain in his foot.  Medications:  . atorvastatin  40 mg Oral Daily  . calcitRIOL  0.25 mcg Oral Daily  . calcium acetate  2,001 mg Oral TID WC  . carvedilol  25 mg Oral BID WC  . cholecalciferol  2,000 Units Oral Daily  . clopidogrel  75 mg Oral Daily  . epoetin (EPOGEN/PROCRIT) injection  20,000 Units Subcutaneous Once  . feeding supplement (NEPRO CARB STEADY)  237 mL Oral BID BM  . furosemide  80 mg Oral Daily  . gentamicin cream  1 application Topical Daily  . heparin injection (subcutaneous)  5,000 Units Subcutaneous Q8H  . hydrALAZINE  25 mg Oral TID  . insulin aspart  0-9 Units Subcutaneous TID AC & HS  . isosorbide mononitrate  30 mg Oral Daily  . multivitamin  1 tablet Oral Daily  . mupirocin ointment  1 application Nasal BID  . sodium chloride flush  3 mL Intravenous Q12H  . sodium chloride flush  3 mL Intravenous Q12H  . tamsulosin  0.4 mg Oral Daily  . vancomycin variable dose per unstable renal function (pharmacist dosing)   Does not apply See admin instructions  . vitamin C  500 mg Oral BID    Objective: Vital signs in last 24 hours: Temp:  [97.3 F (36.3 C)-97.7 F (36.5 C)] 97.3 F (36.3 C) (11/20 0457) Pulse Rate:  [68-71] 68 (11/20 0813) Resp:  [18] 18 (11/20 0813) BP: (96-116)/(40-59) 116/40 (11/20 0813) SpO2:  [95 %-100 %] 98  % (11/20 0813) Weight:  [106.5 kg] 106.5 kg (11/20 0457)  PHYSICAL EXAM:  General: Alert, cooperative, no distress, appears stated age.           Lab Results Recent Labs    07/20/19 0557 07/21/19 0615  WBC 11.0* 12.3*  HGB 7.5* 7.7*  HCT 23.8* 23.0*  NA 135 136  K 4.4 4.0  CL 99 98  CO2 23 24  BUN 47* 49*  CREATININE 7.17* 6.89*  Microbiology: Tissue and bone culture so far negative.  Pathology First metatarsal left excision acute osteomyelitis  Second toe with partial ray excision wound margin appears negative for active inflammation.  Rest of the bone has acute osteomyelitis and soft tissue necrosis.  Assessment/Plan:  Chronic left foot gangrenous infection.  Has underlying peripheral vascular disease.  In June 2020 he was treated with 6 weeks of IV antibiotics followed by angioplasty with stents and then had partial ray great toe amputation September 2020.  He continued to have a nonhealing wound.  During this admission he has had further debridement of the first metatarsal and second  ray excision on 07/19/2019.  He was on Vanco and ceftriaxone and the ceftriaxone was changed to cefepime as previous cultures had Pseudomonas and Citrobacter.  The current culture from the surgical procedure  is pending.  Pathology shows osteomyelitis of the first metatarsal and second but the margins are clear for the second 1.  Patient will  likely need 4 more weeks of IV Currently patient is going on PD route antibiotics as per nephrology. He will be evaluated in 1 week and if needed the route and type of antibiotics will be changed  End-stage renal disease on peritoneal dialysis.     Right toe gangrene present.  Currently does not look infected.  Severe peripheral vascular disease stents and on Plavix  Coronary artery disease with stents  Diabetes mellitus management as per primary team.  Patient says he takes insulin as needed at home.  He says since he went on dialysis his sugar  has remained low.  Hyperlipidemia on atorvastatin.  Discussed the management with the care team

## 2019-07-21 NOTE — TOC Transition Note (Signed)
Transition of Care [facility]) - CM/SW Discharge Note   Patient Details  Name: Scott [name] MRN: [id number] Date of Birth: 1960/09/12  Transition of Care [facility]) CM/SW Contact:  Beverly Sessions, RN Phone Number: 07/21/2019, 3:45 PM   Clinical Narrative:     Patient to discharge home today Per medical team patient will be discharge on IP antibiotics  Per nephrology patient will obtain antibiotics and education at the outpatient clinic.  Patient discharged with paper copy of the scripts.   RW delivered to room  [facility] with [place] notified of discharge  MD to order home health RN and PT   Final next level of care: [place] Barriers to Discharge: No Barriers Identified   Patient Goals and CMS Choice Patient states their goals for this hospitalization and ongoing recovery are:: "complete procedure and get back home" CMS Medicare.gov Compare Post Acute Care list provided to:: Patient Choice offered to / list presented to : Patient  Discharge Placement                       Discharge Plan and Services   Discharge Planning Services: CM Consult                      HH Arranged: RN, PT [facility] Agency: [place] ([place]) Date [place]: 07/21/19 Time [place]: [place] Representative spoke with at [place]: [place] ([place]) Interventions     Readmission Risk Interventions Readmission Risk Prevention Plan 07/19/2019 07/19/2019  Transportation Screening Complete Complete  Medication Review Press photographer) Complete Complete  HRI or Home Care Consult Complete Complete  Palliative Care Screening Not Applicable -  [place] Not Applicable -  Some recent data might be hidden

## 2019-07-21 NOTE — Discharge Instructions (Signed)
You were cared for by a hospitalist during your hospital stay. If you have any questions about your discharge medications or the care you received while you were in the hospital after you are discharged, you can call the unit and ask to speak with the hospitalist on call if the hospitalist that took care of you is not available. Once you are discharged, your primary care physician will handle any further medical issues. Please note that NO REFILLS for any discharge medications will be authorized once you are discharged, as it is imperative that you return to your primary care physician (or establish a relationship with a primary care physician if you do not have one) for your aftercare needs so that they can reassess your need for medications and monitor your lab values.  Follow up with PCP, nephrologist (kidney doctor), infectious disease doctor, podiatrist, vascular surgeon.  Take all medications as prescribed. If symptoms change or worsen please return to the ED for evaluation

## 2019-07-21 NOTE — Progress Notes (Addendum)
PROGRESS NOTE    Scott [name]  MHD:[id number] DOB: 1961/06/20 DOA: 07/17/2019 PCP: [facility] [place]    Brief Narrative:   Scott [name] is a 58 y.o. male with medical history significant of  Rt toe pain since 3 days no trauma /4/10 right now /off and on and unrelated to any ppting or worsening factors. Pt has been taking pain meds and it helps it. Pain throbbing in nature and  is better with elevation of his leg. .  Worse with bearing weight.  Pain is localized and NR.  Patient has a past medical history of left partial MTP amputation, peripheral arterial disease, diabetes mellitus type 2, end-stage renal disease on peritoneal dialysis seen in the emergency room for right toe pain that has been going on for the past few days.  Patient states that it is throbbing in nature and intermittent in nature nonradiating and he takes his pain medications which helps his pain. He was at his podiatrist appointment today for left foot OM and second toe gangrene with rt great toe gangrene with ulceration.He follows up with wound care clinic at [facility]. Pt is scheduled for angiogram tomorrow.  Overall pt is stable except for pain and is to be admitted for OM  And PAD causing nonhealing ischemic ulcers. Please note patient has had a nuclear myocardial perfusion study earlier this month I suspect as part of a preoperative work-up for preprocedural work-up for the patient, this study shows decreased ejection fraction to less than 30%, with a intermediate risk study no ST segment deviation noted during stress portion patient does have a medium defect of severe severity present in the apical anterior apical septal and apical inferior and apex location.  Chart review does show that patient has seen a cardiologist in 2018 however I do not see any new follow-up or office visits.  Patient does follow-up with his wound and vascular doctors and podiatrist on a routine regular basis.  ED Course:   Blood pressure (!) 137/46, pulse 85, temperature 97.9 F (36.6 C), temperature source Oral, resp. rate 18, height 5\' 9"  (1.753 m), weight 95.7 kg, SpO2 94 %.Pt is chronically ill appearing and abd is distended. Pt has been seen by Vascular surgeon and is receiving iv abx rocephin and vancomycin.  Initial labs show a white count of 10.6, hemoglobin of 9.6, RDW of 14.2, platelets of 361.  CMP shows sodium of 135, glucose of 128, BUN of 47, serum creatinine of 7.24.  Interim History: -11/17: s/p left leg angiography with intervention -11/18: had procedure: 1.  His incision with debridement of infected bone and soft tissue left first metatarsal. 2.  Amputation left second toe with partial ray resection.  11/20: Per ID, Dr. Delaine Lame, "he can get vanco and ceftazidime thru [facility] got 2 weeks and once we get all the results back we can extend to 4 weeks if needed, and will also need to Also add nystatin 500,000 units orally TID for the duration of antibiotics for fungal prophylaxis  Subjective:  Pt has generalized weakness, no SOB, has mild cough. No CP. No fever or chill. No GI symptoms.  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  Blood culture has no grow so far. --currently on vancomycin Rocephin -Vascular surgery following -Podiatry following - 11/ 20, Per ID, Dr. Delaine Lame, "he can get vanco and ceftazidime thru [facility] got 2 weeks and once we get all  the results back we can extend to 4 weeks if needed, and will also need to Also add nystatin 500,000 units orally TID for the duration of antibiotics for fungal prophylaxis -will consult wound care for recommendation of wound care after discharge  - 11/20:  Per renal, Dr. Candiss Norse,  "Final regimen- today cefepime 1 gm iv x 1. saturday = oral Levaquin 500 mg PO x 1; starting monday= VANC 2 gm IP twice a week and Ceftazime 1 gm IP x 2 weeks. I have conveyed this information to his nurse at the outpatient dialysis department". "Clarification- ceftazidime 1 gm Daily IP for 2 weeks". And "Nystatin 500,000 units orally three times per day for the duration of treatment."  Peripheral arterial disease: Vasc surg consult with angio 11/17 and s/p left leg angiography with intervention. Pt feels better. Left foot is warm. -off IV heparin, started plavix by [facility]. -will also need ASA?  End-stage renal disease on peritoneal dialysis: -renal was consulted for dialysis -Electrolyte monitoring with daily labs  Type 2 diabetes: -Continue sliding scale insulin -ADA diet  CAD -started plavix -Continue Coreg, Imdur, Lasix and hydralazine   DVT prophylaxis: on sq heparin Code Status: full Family Communication: no, not at bedside Disposition Plan: not ready for discharge. Likely d/c tomorrow. arriers for discharge:    Consultants:   VVS  Renal  Surgeon, podiatrist  Procedures: -11/17: s/p left leg angiography with intervention -11/18: had procedure: 1.  His incision with debridement of infected bone and soft tissue left first metatarsal. 2.  Amputation left second toe with partial ray resection. - 11/17: Procedure(s) Performed: 1. Introduction catheter into left lower extremity 3rd order catheter placement  2. Contrast injection left lower extremity for distal runoff with additional 3rd order  3. Retrieval portion of CXI catheter from left SFA with a trilobed  snare              4. Percutaneous transluminal angioplasty and stent placement left anterior tibial  5. Thromboembolectomy with the penumbra cat 6 device left anterior tibial              6.  Star close closure right common femoral arteriotomy  Antimicrobials: Anti-infectives (From admission, onward)   Start     Dose/Rate Route Frequency Ordered Stop   07/21/19 0800  ceFEPIme (MAXIPIME) 1 g in sodium chloride 0.9 % 100 mL IVPB     1 g 200 mL/hr over 30 Minutes Intravenous Every 24 hours 07/20/19 2025     07/20/19 0921  vancomycin variable dose per unstable renal function (pharmacist dosing)      Does not apply See admin instructions 07/20/19 0921     07/20/19 0715  vancomycin (VANCOCIN) IVPB 1000 mg/200 mL premix     1,000 mg 200 mL/hr over 60 Minutes Intravenous  Once 07/20/19 0707 07/20/19 0907   07/19/19 1558  ceFAZolin (ANCEF) 1-4 GM/50ML-% IVPB    Note to Pharmacy: Sylvester Harder   : cabinet override  07/19/19 1558 07/20/19 0414   07/18/19 1400  cefTRIAXone (ROCEPHIN) 2 g in sodium chloride 0.9 % 100 mL IVPB  Status:  Discontinued     2 g 200 mL/hr over 30 Minutes Intravenous Every 24 hours 07/18/19 0000 07/20/19 2016   07/18/19 0845  ceFAZolin (ANCEF) IVPB 1 g/50 mL premix     1 g 100 mL/hr over 30 Minutes Intravenous  Once 07/18/19 0833 07/18/19 1044   07/18/19 0700  vancomycin (VANCOCIN) 1,250 mg in sodium chloride 0.9 % 250 mL IVPB     1,250 mg 166.7 mL/hr over 90 Minutes Intravenous  Once 07/18/19 0653 07/18/19 2035   07/17/19 1300  vancomycin (VANCOCIN) IVPB 1000 mg/200 mL premix     1,000 mg 200 mL/hr over 60 Minutes Intravenous  Once 07/17/19 1257 07/18/19 0010   07/17/19 1300  cefTRIAXone (ROCEPHIN) 2 g in sodium chloride 0.9 % 100 mL IVPB     2 g 200 mL/hr over 30 Minutes Intravenous  Once 07/17/19 1257 07/17/19 1648      Objective: Vitals:   07/20/19 2203 07/21/19 0457 07/21/19 0813 07/21/19 1246  BP: (!) 97/43 (!) 97/59 (!) 116/40  (!) 120/52  Pulse: 69 69 68 74  Resp: 18 18 18 18   Temp: (!) 97.5 F (36.4 C) (!) 97.3 F (36.3 C)  (!) 97.4 F (36.3 C)  TempSrc: Oral Oral  Oral  SpO2: 99% 100% 98% 100%  Weight:  106.5 kg    Height:        Intake/Output Summary (Last 24 hours) at 07/21/2019 1334 Last data filed at 07/21/2019 1054 Gross per 24 hour  Intake 8842.26 ml  Output 47 ml  Net 8795.26 ml   Filed Weights   07/19/19 0500 07/20/19 0500 07/21/19 0457  Weight: 90.5 kg 99.6 kg 106.5 kg    Examination:  Physical Exam:  General: Not in acute distress HEENT: PERRL, EOMI, no scleral icterus, No JVD or bruit Cardiac: S1/S2, RRR, No murmurs, gallops or rubs Pulm: No rales, wheezing, rhonchi or rubs. Abd: Soft, nondistended, nontender, no rebound pain, no organomegaly, BS present Ext: s/p of  amputation left second toe with partial ray resection. S/p of amputation of left great toe. Left foot is warm.  Musculoskeletal: No joint deformities, erythema, or stiffness, ROM full Skin: No rashes.  Neuro: Alert and oriented X3, cranial nerves II-XII grossly intact, moves all extremeties normally. Psych: Patient is not psychotic, no suicidal or hemocidal ideation.    Data Reviewed: I have personally reviewed following labs and imaging studies  CBC: Recent Labs  Lab 07/17/19 1359 07/19/19 0755 07/20/19 0142 07/20/19 0557 07/21/19 0615  WBC 10.6* 10.8* 12.3* 11.0* 12.3*  NEUTROABS 8.5*  --   --   --   --   HGB 9.6* 8.0* 8.1* 7.5* 7.7*  HCT 30.4* 24.4* 25.7* 23.8* 23.0*  MCV 94.7 92.1 94.8 94.8 91.3  PLT 361 285 318 292 062   Basic Metabolic Panel: Recent Labs  Lab 07/17/19 1359 07/18/19 0857 07/19/19 0755 07/20/19 0557 07/21/19 0615  NA 135  --  135 135 136  K 4.7 4.7 4.5 4.4 4.0  CL 98  --  93* 99 98  CO2 22  --  23 23 24   GLUCOSE 128*  --  116* 204* 190*  BUN 47*  --  49* 47* 49*  CREATININE 7.24*  --  7.10* 7.17* 6.89*  CALCIUM 8.1*  --  7.3* 7.1* 7.3*   GFR: Estimated Creatinine  Clearance: 13.7 mL/min (A) (by  C-G formula based on SCr of 6.89 mg/dL (H)). Liver Function Tests: No results for input(s): AST, ALT, ALKPHOS, BILITOT, PROT, ALBUMIN in the last 168 hours. No results for input(s): LIPASE, AMYLASE in the last 168 hours. No results for input(s): AMMONIA in the last 168 hours. Coagulation Profile: Recent Labs  Lab 07/17/19 1359  INR 1.2   Cardiac Enzymes: Recent Labs  Lab 07/18/19 0522  CKTOTAL 88   BNP (last 3 results) No results for input(s): PROBNP in the last 8760 hours. HbA1C: No results for input(s): HGBA1C in the last 72 hours. CBG: Recent Labs  Lab 07/21/19 0004 07/21/19 0547 07/21/19 0817 07/21/19 1150 07/21/19 1229  GLUCAP 128* 175* 140* 48* 105*   Lipid Profile: No results for input(s): CHOL, HDL, LDLCALC, TRIG, CHOLHDL, LDLDIRECT in the last 72 hours. Thyroid Function Tests: No results for input(s): TSH, T4TOTAL, FREET4, T3FREE, THYROIDAB in the last 72 hours. Anemia Panel: No results for input(s): VITAMINB12, FOLATE, FERRITIN, TIBC, IRON, RETICCTPCT in the last 72 hours. Sepsis Labs: Recent Labs  Lab 07/17/19 1530 07/17/19 1823  LATICACIDVEN 1.3 1.4    Recent Results (from the past 240 hour(s))  SARS CORONAVIRUS 2 (TAT 6-24 HRS) Nasopharyngeal Nasopharyngeal Swab     Status: None   Collection Time: 07/14/19  1:11 PM   Specimen: Nasopharyngeal Swab  Result Value Ref Range Status   SARS Coronavirus 2 NEGATIVE NEGATIVE Final    Comment: (NOTE) SARS-CoV-2 target nucleic acids are NOT DETECTED. The SARS-CoV-2 RNA is generally detectable in upper and lower respiratory specimens during the acute phase of infection. Negative results do not preclude SARS-CoV-2 infection, do not rule out co-infections with other pathogens, and should not be used as the sole basis for treatment or other patient management decisions. Negative results must be combined with clinical observations, patient history, and epidemiological information.  The expected result is Negative. Fact Sheet for Patients: SugarRoll.be Fact Sheet for Healthcare Providers: https://www.woods-mathews.com/ This test is not yet approved or cleared by the [place] FDA and  has been authorized for detection and/or diagnosis of SARS-CoV-2 by FDA under an Emergency Use Authorization (EUA). This EUA will remain  in effect (meaning this test can be used) for the duration of the COVID-19 declaration under Section 56 4(b)(1) of the Act, 21 U.S.C. section 360bbb-3(b)(1), unless the authorization is terminated or revoked sooner. Performed at [facility], [place] [address], [place], [place] [address]   Surgical PCR screen     Status: None   Collection Time: 07/18/19  6:35 AM   Specimen: Nasal Mucosa; Nasal Swab  Result Value Ref Range Status   MRSA, PCR NEGATIVE NEGATIVE Final   Staphylococcus aureus NEGATIVE NEGATIVE Final    Comment: (NOTE) The Xpert SA Assay (FDA approved for NASAL specimens in patients 35 years of age and older), is one component of a comprehensive surveillance program. It is not intended to diagnose infection nor to guide or monitor treatment. Performed at [facility], [address] [place], [place] [address]   Aerobic/Anaerobic Culture (surgical/deep wound)     Status: None (Preliminary result)   Collection Time: 07/19/19  5:11 PM   Specimen: Wound; Tissue  Result Value Ref Range Status   Specimen Description TISSUE  Final   Special Requests LEFT 1ST METATARSAL  Final   Gram Stain   Final    MODERATE WBC PRESENT, PREDOMINANTLY MONONUCLEAR NO ORGANISMS SEEN    Culture   Final    NO GROWTH < 24 HOURS Performed  at [facility], [place] [address] [place], [place] [address]    Report Status PENDING  Incomplete  Aerobic/Anaerobic Culture (surgical/deep wound)     Status: None (Preliminary result)   Collection Time: 07/19/19  5:15 PM   Specimen: Wound; Tissue   Result Value Ref Range Status   Specimen Description TISSUE  Final   Special Requests SAMPLE B ,2ND METATARSAL BONE  Final   Gram Stain   Final    RARE WBC PRESENT, PREDOMINANTLY MONONUCLEAR NO ORGANISMS SEEN    Culture   Final    NO GROWTH < 24 HOURS Performed at [facility], [address] [place], [place] [address]    Report Status PENDING  Incomplete     RN Pressure Injury Documentation:       Radiology Studies: No results found.      Scheduled Meds: . atorvastatin  40 mg Oral Daily  . calcitRIOL  0.25 mcg Oral Daily  . calcium acetate  2,001 mg Oral TID WC  . carvedilol  25 mg Oral BID WC  . cholecalciferol  2,000 Units Oral Daily  . clopidogrel  75 mg Oral Daily  . epoetin (EPOGEN/PROCRIT) injection  20,000 Units Subcutaneous Once  . feeding supplement (NEPRO CARB STEADY)  237 mL Oral BID BM  . furosemide  80 mg Oral Daily  . gentamicin cream  1 application Topical Daily  . heparin injection (subcutaneous)  5,000 Units Subcutaneous Q8H  . hydrALAZINE  25 mg Oral TID  . insulin aspart  0-9 Units Subcutaneous TID AC & HS  . isosorbide mononitrate  30 mg Oral Daily  . multivitamin  1 tablet Oral Daily  . mupirocin ointment  1 application Nasal BID  . sodium chloride flush  3 mL Intravenous Q12H  . sodium chloride flush  3 mL Intravenous Q12H  . tamsulosin  0.4 mg Oral Daily  . vancomycin variable dose per unstable renal function (pharmacist dosing)   Does not apply See admin instructions  . vitamin C  500 mg Oral BID   Continuous Infusions: . sodium chloride    . sodium chloride    . ceFEPime (MAXIPIME) IV 1 g (07/21/19 0829)  . dialysis solution 1.5% low-MG/low-CA       LOS: 4 days    Time spent: 30 min     Ivor Costa, DO [facility] PAGER is on [place]  If 7PM-7AM, please contact night-coverage [email or web address] Password [email or web address] 07/21/2019, 1:34 PM

## 2019-07-22 LAB — PARATHYROID HORMONE, INTACT (NO CA): PTH: 127 pg/mL — ABNORMAL HIGH (ref 15–65)

## 2019-07-22 LAB — HEPATITIS B SURFACE ANTIBODY, QUANTITATIVE: Hep B S AB Quant (Post): 873.9 m[IU]/mL (ref >9.9)

## 2019-07-24 ENCOUNTER — Telehealth (INDEPENDENT_AMBULATORY_CARE_PROVIDER_SITE_OTHER): Payer: Self-pay

## 2019-07-24 ENCOUNTER — Ambulatory Visit: Payer: Commercial Managed Care - HMO | Admitting: Physician Assistant

## 2019-07-24 ENCOUNTER — Other Ambulatory Visit: Payer: Commercial Managed Care - HMO

## 2019-07-24 ENCOUNTER — Telehealth: Payer: Self-pay | Admitting: Radiology

## 2019-07-24 LAB — AEROBIC/ANAEROBIC CULTURE W GRAM STAIN (SURGICAL/DEEP WOUND)

## 2019-07-24 NOTE — Telephone Encounter (Signed)
  Hi Mickel Baas can we get Scott [name] set up for a right leg angiogram on 08/01/2019 Cleda Mccreedy just came down and said his right tow is looking bad and can't wait till I see him on the 7th  Me: okay diagnosis please.  Dr. [name]: ischemia right lower extremity; atherosclerosis with gangrene          I attempted to contact the patient regarding his procedure with Dr. [name] on 08/01/2019 with a 12:00 pm arrival time to the [facility]. Patient will do his covid testing on 07/28/2019 before 12:00 pm at the [facility]. Pre-procedure instructions, times and dates will be mailed to the patient.

## 2019-07-24 NOTE — Telephone Encounter (Signed)
Patient states he has to have an emergency vascular surgery and would like to postpone surgery with Dr Erlene Quan. Please advise.

## 2019-07-25 NOTE — Telephone Encounter (Signed)
Okay, just reschedule it.  Hollice Espy, MD

## 2019-07-25 NOTE — Telephone Encounter (Addendum)
Spoke with the the patient and discussed all dates and times regarding his procedure with Dr. [name] on 08/01/2019 at the [facility]. Patient will do covid testing on 07/28/2019 before 12:00 pm at the [facility]. This information has been mailed to the patient as well. Patient stated he understood.

## 2019-07-26 ENCOUNTER — Inpatient Hospital Stay: Admission: RE | Admit: 2019-07-26 | Payer: Commercial Managed Care - HMO | Source: Ambulatory Visit

## 2019-07-26 ENCOUNTER — Other Ambulatory Visit: Payer: Self-pay | Admitting: Infectious Diseases

## 2019-07-26 MED ORDER — SULFAMETHOXAZOLE-TRIMETHOPRIM 400-80 MG PO TABS: 1.0000 | ORAL_TABLET | Freq: Every day | ORAL | 0 refills | Status: DC

## 2019-07-28 ENCOUNTER — Other Ambulatory Visit
Admission: RE | Admit: 2019-07-28 | Discharge: 2019-07-28 | Disposition: A | Discharge status: Home / Self Care | Payer: Medicare Other | Source: Ambulatory Visit | Attending: Vascular Surgery | Admitting: Vascular Surgery

## 2019-07-28 ENCOUNTER — Inpatient Hospital Stay: Admission: RE | Admit: 2019-07-28 | Payer: Commercial Managed Care - HMO | Source: Ambulatory Visit

## 2019-07-28 DIAGNOSIS — Z20828 Contact with and (suspected) exposure to other viral communicable diseases: Secondary | ICD-10-CM | POA: Insufficient documentation

## 2019-07-28 DIAGNOSIS — Z01812 Encounter for preprocedural laboratory examination: Secondary | ICD-10-CM | POA: Insufficient documentation

## 2019-07-28 DIAGNOSIS — E1152 Type 2 diabetes mellitus with diabetic peripheral angiopathy with gangrene: Secondary | ICD-10-CM | POA: Diagnosis not present

## 2019-07-28 DIAGNOSIS — M79604 Pain in right leg: Secondary | ICD-10-CM | POA: Diagnosis not present

## 2019-07-29 LAB — SARS CORONAVIRUS 2 (TAT 6-24 HRS): SARS Coronavirus 2: NEGATIVE

## 2019-07-31 ENCOUNTER — Other Ambulatory Visit: Payer: Self-pay

## 2019-07-31 ENCOUNTER — Ambulatory Visit: Admission: RE | Admit: 2019-07-31 | Payer: Commercial Managed Care - HMO | Source: Home / Self Care | Admitting: Urology

## 2019-07-31 ENCOUNTER — Encounter: Payer: Self-pay | Admitting: Emergency Medicine

## 2019-07-31 ENCOUNTER — Other Ambulatory Visit (INDEPENDENT_AMBULATORY_CARE_PROVIDER_SITE_OTHER): Payer: Self-pay | Admitting: Vascular Surgery

## 2019-07-31 ENCOUNTER — Encounter: Admission: RE | Payer: Self-pay | Source: Home / Self Care

## 2019-07-31 ENCOUNTER — Inpatient Hospital Stay
Admission: EM | Admit: 2019-07-31 | Discharge: 2019-08-12 | DRG: 252 | Disposition: A | Discharge status: Home Health | Payer: Medicare Other | Attending: Internal Medicine | Admitting: Internal Medicine

## 2019-07-31 DIAGNOSIS — Z7982 Long term (current) use of aspirin: Secondary | ICD-10-CM

## 2019-07-31 DIAGNOSIS — Z955 Presence of coronary angioplasty implant and graft: Secondary | ICD-10-CM

## 2019-07-31 DIAGNOSIS — N2581 Secondary hyperparathyroidism of renal origin: Secondary | ICD-10-CM | POA: Diagnosis present

## 2019-07-31 DIAGNOSIS — N186 End stage renal disease: Secondary | ICD-10-CM | POA: Diagnosis present

## 2019-07-31 DIAGNOSIS — R109 Unspecified abdominal pain: Secondary | ICD-10-CM

## 2019-07-31 DIAGNOSIS — M869 Osteomyelitis, unspecified: Secondary | ICD-10-CM | POA: Diagnosis present

## 2019-07-31 DIAGNOSIS — I70269 Atherosclerosis of native arteries of extremities with gangrene, unspecified extremity: Secondary | ICD-10-CM | POA: Diagnosis not present

## 2019-07-31 DIAGNOSIS — Z89421 Acquired absence of other right toe(s): Secondary | ICD-10-CM | POA: Diagnosis not present

## 2019-07-31 DIAGNOSIS — I5022 Chronic systolic (congestive) heart failure: Secondary | ICD-10-CM | POA: Diagnosis present

## 2019-07-31 DIAGNOSIS — I2510 Atherosclerotic heart disease of native coronary artery without angina pectoris: Secondary | ICD-10-CM | POA: Diagnosis present

## 2019-07-31 DIAGNOSIS — E1152 Type 2 diabetes mellitus with diabetic peripheral angiopathy with gangrene: Secondary | ICD-10-CM | POA: Diagnosis present

## 2019-07-31 DIAGNOSIS — I739 Peripheral vascular disease, unspecified: Secondary | ICD-10-CM | POA: Diagnosis present

## 2019-07-31 DIAGNOSIS — G253 Myoclonus: Secondary | ICD-10-CM | POA: Diagnosis not present

## 2019-07-31 DIAGNOSIS — Z7902 Long term (current) use of antithrombotics/antiplatelets: Secondary | ICD-10-CM

## 2019-07-31 DIAGNOSIS — Z79891 Long term (current) use of opiate analgesic: Secondary | ICD-10-CM

## 2019-07-31 DIAGNOSIS — Z6830 Body mass index (BMI) 30.0-30.9, adult: Secondary | ICD-10-CM

## 2019-07-31 DIAGNOSIS — Z992 Dependence on renal dialysis: Secondary | ICD-10-CM | POA: Diagnosis not present

## 2019-07-31 DIAGNOSIS — E119 Type 2 diabetes mellitus without complications: Secondary | ICD-10-CM

## 2019-07-31 DIAGNOSIS — I132 Hypertensive heart and chronic kidney disease with heart failure and with stage 5 chronic kidney disease, or end stage renal disease: Secondary | ICD-10-CM | POA: Diagnosis present

## 2019-07-31 DIAGNOSIS — I998 Other disorder of circulatory system: Secondary | ICD-10-CM | POA: Diagnosis not present

## 2019-07-31 DIAGNOSIS — Z9081 Acquired absence of spleen: Secondary | ICD-10-CM

## 2019-07-31 DIAGNOSIS — Z89412 Acquired absence of left great toe: Secondary | ICD-10-CM

## 2019-07-31 DIAGNOSIS — E1129 Type 2 diabetes mellitus with other diabetic kidney complication: Secondary | ICD-10-CM | POA: Diagnosis present

## 2019-07-31 DIAGNOSIS — K2100 Gastro-esophageal reflux disease with esophagitis, without bleeding: Secondary | ICD-10-CM | POA: Diagnosis present

## 2019-07-31 DIAGNOSIS — I9581 Postprocedural hypotension: Secondary | ICD-10-CM | POA: Diagnosis not present

## 2019-07-31 DIAGNOSIS — Z20828 Contact with and (suspected) exposure to other viral communicable diseases: Secondary | ICD-10-CM | POA: Diagnosis present

## 2019-07-31 DIAGNOSIS — E1122 Type 2 diabetes mellitus with diabetic chronic kidney disease: Secondary | ICD-10-CM | POA: Diagnosis present

## 2019-07-31 DIAGNOSIS — Z89422 Acquired absence of other left toe(s): Secondary | ICD-10-CM | POA: Diagnosis not present

## 2019-07-31 DIAGNOSIS — L97919 Non-pressure chronic ulcer of unspecified part of right lower leg with unspecified severity: Secondary | ICD-10-CM | POA: Diagnosis present

## 2019-07-31 DIAGNOSIS — I70203 Unspecified atherosclerosis of native arteries of extremities, bilateral legs: Secondary | ICD-10-CM | POA: Diagnosis present

## 2019-07-31 DIAGNOSIS — K5900 Constipation, unspecified: Secondary | ICD-10-CM | POA: Diagnosis not present

## 2019-07-31 DIAGNOSIS — Z79899 Other long term (current) drug therapy: Secondary | ICD-10-CM

## 2019-07-31 DIAGNOSIS — E669 Obesity, unspecified: Secondary | ICD-10-CM | POA: Diagnosis present

## 2019-07-31 DIAGNOSIS — Z794 Long term (current) use of insulin: Secondary | ICD-10-CM

## 2019-07-31 DIAGNOSIS — E785 Hyperlipidemia, unspecified: Secondary | ICD-10-CM | POA: Diagnosis present

## 2019-07-31 DIAGNOSIS — Z872 Personal history of diseases of the skin and subcutaneous tissue: Secondary | ICD-10-CM

## 2019-07-31 DIAGNOSIS — Z95828 Presence of other vascular implants and grafts: Secondary | ICD-10-CM

## 2019-07-31 DIAGNOSIS — I252 Old myocardial infarction: Secondary | ICD-10-CM

## 2019-07-31 DIAGNOSIS — Z95820 Peripheral vascular angioplasty status with implants and grafts: Secondary | ICD-10-CM

## 2019-07-31 DIAGNOSIS — I96 Gangrene, not elsewhere classified: Secondary | ICD-10-CM | POA: Diagnosis not present

## 2019-07-31 DIAGNOSIS — K219 Gastro-esophageal reflux disease without esophagitis: Secondary | ICD-10-CM | POA: Diagnosis present

## 2019-07-31 DIAGNOSIS — Z9889 Other specified postprocedural states: Secondary | ICD-10-CM | POA: Diagnosis not present

## 2019-07-31 DIAGNOSIS — D631 Anemia in chronic kidney disease: Secondary | ICD-10-CM | POA: Diagnosis present

## 2019-07-31 DIAGNOSIS — M79604 Pain in right leg: Secondary | ICD-10-CM | POA: Diagnosis present

## 2019-07-31 DIAGNOSIS — M79606 Pain in leg, unspecified: Secondary | ICD-10-CM | POA: Diagnosis not present

## 2019-07-31 DIAGNOSIS — E1169 Type 2 diabetes mellitus with other specified complication: Secondary | ICD-10-CM | POA: Diagnosis present

## 2019-07-31 DIAGNOSIS — R0902 Hypoxemia: Secondary | ICD-10-CM | POA: Diagnosis present

## 2019-07-31 DIAGNOSIS — Z833 Family history of diabetes mellitus: Secondary | ICD-10-CM

## 2019-07-31 DIAGNOSIS — I70261 Atherosclerosis of native arteries of extremities with gangrene, right leg: Secondary | ICD-10-CM | POA: Diagnosis not present

## 2019-07-31 DIAGNOSIS — Z89411 Acquired absence of right great toe: Secondary | ICD-10-CM | POA: Diagnosis not present

## 2019-07-31 DIAGNOSIS — E1165 Type 2 diabetes mellitus with hyperglycemia: Secondary | ICD-10-CM | POA: Diagnosis not present

## 2019-07-31 DIAGNOSIS — Z8249 Family history of ischemic heart disease and other diseases of the circulatory system: Secondary | ICD-10-CM

## 2019-07-31 DIAGNOSIS — F329 Major depressive disorder, single episode, unspecified: Secondary | ICD-10-CM | POA: Diagnosis present

## 2019-07-31 DIAGNOSIS — Z720 Tobacco use: Secondary | ICD-10-CM | POA: Diagnosis present

## 2019-07-31 DIAGNOSIS — Z87891 Personal history of nicotine dependence: Secondary | ICD-10-CM

## 2019-07-31 LAB — GLUCOSE, CAPILLARY
Glucose-Capillary: 107 mg/dL — ABNORMAL HIGH (ref 70–99)
Glucose-Capillary: 124 mg/dL — ABNORMAL HIGH (ref 70–99)
Glucose-Capillary: 133 mg/dL — ABNORMAL HIGH (ref 70–99)

## 2019-07-31 LAB — CBC
HCT: 26.9 % — ABNORMAL LOW (ref 39.0–52.0)
Hemoglobin: 8.4 g/dL — ABNORMAL LOW (ref 13.0–17.0)
MCH: 30.7 pg (ref 26.0–34.0)
MCHC: 31.2 g/dL (ref 30.0–36.0)
MCV: 98.2 fL (ref 80.0–100.0)
Platelets: 266 10*3/uL (ref 150–400)
RBC: 2.74 MIL/uL — ABNORMAL LOW (ref 4.22–5.81)
RDW: 16.1 % — ABNORMAL HIGH (ref 11.5–15.5)
WBC: 11.7 10*3/uL — ABNORMAL HIGH (ref 4.0–10.5)
nRBC: 0 % (ref 0.0–0.2)

## 2019-07-31 LAB — COMPREHENSIVE METABOLIC PANEL
ALT: 19 U/L (ref 0–44)
AST: 23 U/L (ref 15–41)
Albumin: 2.6 g/dL — ABNORMAL LOW (ref 3.5–5.0)
Alkaline Phosphatase: 110 U/L (ref 38–126)
Anion gap: 14 (ref 5–15)
BUN: 60 mg/dL — ABNORMAL HIGH (ref 6–20)
CO2: 23 mmol/L (ref 22–32)
Calcium: 7.5 mg/dL — ABNORMAL LOW (ref 8.9–10.3)
Chloride: 99 mmol/L (ref 98–111)
Creatinine, Ser: 7.36 mg/dL — ABNORMAL HIGH (ref 0.61–1.24)
GFR calc Af Amer: 8 mL/min — ABNORMAL LOW (ref >60)
GFR calc non Af Amer: 7 mL/min — ABNORMAL LOW (ref >60)
Glucose, Bld: 216 mg/dL — ABNORMAL HIGH (ref 70–99)
Potassium: 4.6 mmol/L (ref 3.5–5.1)
Sodium: 136 mmol/L (ref 135–145)
Total Bilirubin: 0.8 mg/dL (ref 0.3–1.2)
Total Protein: 6.4 g/dL — ABNORMAL LOW (ref 6.5–8.1)

## 2019-07-31 LAB — PROTIME-INR
INR: 1.1 (ref 0.8–1.2)
Prothrombin Time: 14.1 seconds (ref 11.4–15.2)

## 2019-07-31 LAB — TYPE AND SCREEN
ABO/RH(D): O POS
Antibody Screen: NEGATIVE

## 2019-07-31 LAB — HEPARIN LEVEL (UNFRACTIONATED): Heparin Unfractionated: 0.33 IU/mL (ref 0.30–0.70)

## 2019-07-31 LAB — APTT: aPTT: 37 seconds — ABNORMAL HIGH (ref 24–36)

## 2019-07-31 LAB — VANCOMYCIN, RANDOM: Vancomycin Rm: 28

## 2019-07-31 SURGERY — CYSTOSCOPY, WITH RETROGRADE PYELOGRAM
Anesthesia: Choice | Laterality: Right

## 2019-07-31 MED ORDER — CLOPIDOGREL BISULFATE 75 MG PO TABS
75.0000 mg | ORAL_TABLET | Freq: Every day | ORAL | Status: DC
  Administered 2019-08-01 – 2019-08-12 (×10): 75 mg via ORAL
  Filled 2019-07-31 (×11): qty 1

## 2019-07-31 MED ORDER — TRAMADOL HCL 50 MG PO TABS
50.0000 mg | ORAL_TABLET | Freq: Four times a day (QID) | ORAL | Status: DC | PRN
  Administered 2019-08-03: 50 mg via ORAL
  Filled 2019-07-31: qty 1

## 2019-07-31 MED ORDER — TAMSULOSIN HCL 0.4 MG PO CAPS
0.4000 mg | ORAL_CAPSULE | Freq: Every day | ORAL | Status: DC
  Administered 2019-07-31 – 2019-08-05 (×5): 0.4 mg via ORAL
  Filled 2019-07-31 (×6): qty 1

## 2019-07-31 MED ORDER — HEPARIN BOLUS VIA INFUSION
5000.0000 [IU] | Freq: Once | INTRAVENOUS | Status: AC
  Administered 2019-07-31: 5000 [IU] via INTRAVENOUS
  Filled 2019-07-31: qty 5000

## 2019-07-31 MED ORDER — SENNOSIDES-DOCUSATE SODIUM 8.6-50 MG PO TABS
1.0000 | ORAL_TABLET | Freq: Every evening | ORAL | Status: DC | PRN
  Filled 2019-07-31: qty 1

## 2019-07-31 MED ORDER — HEPARIN (PORCINE) 25000 UT/250ML-% IV SOLN
1650.0000 [IU]/h | INTRAVENOUS | Status: AC
  Administered 2019-07-31: 1450 [IU]/h via INTRAVENOUS
  Administered 2019-08-01: 1650 [IU]/h via INTRAVENOUS
  Administered 2019-08-02: 1450 [IU]/h via INTRAVENOUS
  Administered 2019-08-03: 1300 [IU]/h via INTRAVENOUS
  Administered 2019-08-04 (×2): 1500 [IU]/h via INTRAVENOUS
  Filled 2019-07-31 (×8): qty 250

## 2019-07-31 MED ORDER — SULFAMETHOXAZOLE-TRIMETHOPRIM 400-80 MG PO TABS
1.0000 | ORAL_TABLET | Freq: Every day | ORAL | Status: DC
  Administered 2019-07-31 – 2019-08-12 (×12): 1 via ORAL
  Filled 2019-07-31 (×13): qty 1

## 2019-07-31 MED ORDER — ALBUTEROL SULFATE (2.5 MG/3ML) 0.083% IN NEBU: 3.0000 mL | INHALATION_SOLUTION | Freq: Four times a day (QID) | RESPIRATORY_TRACT | Status: DC | PRN

## 2019-07-31 MED ORDER — ONDANSETRON HCL 4 MG PO TABS
4.0000 mg | ORAL_TABLET | Freq: Four times a day (QID) | ORAL | Status: DC | PRN
  Administered 2019-08-03: 4 mg via ORAL
  Filled 2019-07-31 (×2): qty 1

## 2019-07-31 MED ORDER — OXYCODONE-ACETAMINOPHEN 5-325 MG PO TABS
1.0000 | ORAL_TABLET | ORAL | Status: AC | PRN
  Administered 2019-07-31 – 2019-08-01 (×2): 1 via ORAL
  Filled 2019-07-31 (×2): qty 1

## 2019-07-31 MED ORDER — CALCIUM ACETATE (PHOS BINDER) 667 MG PO CAPS: 667.0000 mg | ORAL_CAPSULE | ORAL | Status: DC

## 2019-07-31 MED ORDER — INSULIN ASPART PROT & ASPART (70-30 MIX) 100 UNIT/ML ~~LOC~~ SUSP: 25.0000 [IU] | Freq: Two times a day (BID) | SUBCUTANEOUS | Status: DC

## 2019-07-31 MED ORDER — ASPIRIN EC 81 MG PO TBEC
81.0000 mg | DELAYED_RELEASE_TABLET | Freq: Every day | ORAL | Status: DC
  Administered 2019-07-31 – 2019-08-12 (×12): 81 mg via ORAL
  Filled 2019-07-31 (×12): qty 1

## 2019-07-31 MED ORDER — ISOSORBIDE MONONITRATE ER 30 MG PO TB24
30.0000 mg | ORAL_TABLET | Freq: Every day | ORAL | Status: DC
  Administered 2019-08-01: 30 mg via ORAL
  Filled 2019-07-31 (×2): qty 1

## 2019-07-31 MED ORDER — CARVEDILOL 25 MG PO TABS
25.0000 mg | ORAL_TABLET | Freq: Two times a day (BID) | ORAL | Status: DC
  Administered 2019-07-31 – 2019-08-01 (×2): 25 mg via ORAL
  Filled 2019-07-31 (×6): qty 1

## 2019-07-31 MED ORDER — ACETAMINOPHEN 650 MG RE SUPP: 650.0000 mg | Freq: Four times a day (QID) | RECTAL | Status: DC | PRN

## 2019-07-31 MED ORDER — PANTOPRAZOLE SODIUM 40 MG PO TBEC
40.0000 mg | DELAYED_RELEASE_TABLET | Freq: Every day | ORAL | Status: DC
  Administered 2019-07-31 – 2019-08-12 (×12): 40 mg via ORAL
  Filled 2019-07-31 (×12): qty 1

## 2019-07-31 MED ORDER — ONDANSETRON 4 MG PO TBDP
4.0000 mg | ORAL_TABLET | Freq: Once | ORAL | Status: AC
  Administered 2019-07-31: 4 mg via ORAL

## 2019-07-31 MED ORDER — CALCIUM ACETATE (PHOS BINDER) 667 MG PO CAPS
667.0000 mg | ORAL_CAPSULE | Freq: Two times a day (BID) | ORAL | Status: DC | PRN
  Administered 2019-08-05: 667 mg via ORAL
  Filled 2019-07-31 (×2): qty 2

## 2019-07-31 MED ORDER — ONDANSETRON 4 MG PO TBDP
ORAL_TABLET | ORAL | Status: AC
  Filled 2019-07-31: qty 1

## 2019-07-31 MED ORDER — VANCOMYCIN VARIABLE DOSE PER UNSTABLE RENAL FUNCTION (PHARMACIST DOSING): Status: DC

## 2019-07-31 MED ORDER — VANCOMYCIN IV (FOR PTA / DISCHARGE USE ONLY): 2000.0000 mg | INTRAVENOUS | Status: DC

## 2019-07-31 MED ORDER — INSULIN ASPART 100 UNIT/ML ~~LOC~~ SOLN
2.0000 [IU] | Freq: Three times a day (TID) | SUBCUTANEOUS | Status: DC
  Administered 2019-07-31 – 2019-08-12 (×18): 2 [IU] via SUBCUTANEOUS
  Filled 2019-07-31 (×18): qty 1

## 2019-07-31 MED ORDER — ATORVASTATIN CALCIUM 20 MG PO TABS
40.0000 mg | ORAL_TABLET | Freq: Every day | ORAL | Status: DC
  Administered 2019-07-31 – 2019-08-12 (×12): 40 mg via ORAL
  Filled 2019-07-31 (×12): qty 2

## 2019-07-31 MED ORDER — INSULIN ASPART 100 UNIT/ML ~~LOC~~ SOLN
0.0000 [IU] | Freq: Three times a day (TID) | SUBCUTANEOUS | Status: DC
  Administered 2019-07-31: 1 [IU] via SUBCUTANEOUS
  Administered 2019-07-31: 2 [IU] via SUBCUTANEOUS
  Administered 2019-08-01 – 2019-08-03 (×3): 1 [IU] via SUBCUTANEOUS
  Administered 2019-08-04 – 2019-08-05 (×2): 2 [IU] via SUBCUTANEOUS
  Administered 2019-08-06 – 2019-08-07 (×3): 1 [IU] via SUBCUTANEOUS
  Administered 2019-08-09: 2 [IU] via SUBCUTANEOUS
  Administered 2019-08-12: 1 [IU] via SUBCUTANEOUS
  Filled 2019-07-31 (×11): qty 1

## 2019-07-31 MED ORDER — CALCIUM ACETATE (PHOS BINDER) 667 MG PO CAPS
2001.0000 mg | ORAL_CAPSULE | Freq: Three times a day (TID) | ORAL | Status: DC
  Administered 2019-07-31 – 2019-08-12 (×21): 2001 mg via ORAL
  Filled 2019-07-31 (×41): qty 3

## 2019-07-31 MED ORDER — ONDANSETRON HCL 4 MG/2ML IJ SOLN
4.0000 mg | Freq: Four times a day (QID) | INTRAMUSCULAR | Status: DC | PRN
  Administered 2019-07-31: 4 mg via INTRAVENOUS
  Filled 2019-07-31 (×2): qty 2

## 2019-07-31 MED ORDER — INSULIN ASPART 100 UNIT/ML ~~LOC~~ SOLN: 0.0000 [IU] | Freq: Every day | SUBCUTANEOUS | Status: DC

## 2019-07-31 MED ORDER — ACETAMINOPHEN 325 MG PO TABS
650.0000 mg | ORAL_TABLET | Freq: Four times a day (QID) | ORAL | Status: DC | PRN
  Administered 2019-08-04 – 2019-08-06 (×3): 650 mg via ORAL
  Filled 2019-07-31 (×3): qty 2

## 2019-07-31 MED ORDER — SODIUM CHLORIDE 0.9 % IV SOLN
Freq: Once | INTRAVENOUS | Status: AC
  Administered 2019-07-31: 16:00:00 via INTRAVENOUS

## 2019-07-31 MED ORDER — HYDROMORPHONE HCL 1 MG/ML IJ SOLN
1.0000 mg | Freq: Once | INTRAMUSCULAR | Status: AC
  Administered 2019-08-01: 1 mg via INTRAVENOUS
  Filled 2019-07-31: qty 1

## 2019-07-31 MED ORDER — METOCLOPRAMIDE HCL 5 MG/ML IJ SOLN
10.0000 mg | Freq: Once | INTRAMUSCULAR | Status: AC
  Administered 2019-07-31: 10 mg via INTRAVENOUS
  Filled 2019-07-31: qty 2

## 2019-07-31 MED ORDER — CALCITRIOL 0.25 MCG PO CAPS
0.2500 ug | ORAL_CAPSULE | Freq: Every day | ORAL | Status: DC
  Administered 2019-07-31 – 2019-08-12 (×12): 0.25 ug via ORAL
  Filled 2019-07-31 (×13): qty 1

## 2019-07-31 MED ORDER — HYDROCODONE-ACETAMINOPHEN 5-325 MG PO TABS
1.0000 | ORAL_TABLET | ORAL | Status: DC | PRN
  Administered 2019-08-01: 17:00:00 2 via ORAL
  Administered 2019-08-01: 1 via ORAL
  Administered 2019-08-02 – 2019-08-03 (×3): 2 via ORAL
  Filled 2019-07-31 (×3): qty 2
  Filled 2019-07-31: qty 1

## 2019-07-31 MED ORDER — HYDRALAZINE HCL 25 MG PO TABS
25.0000 mg | ORAL_TABLET | Freq: Three times a day (TID) | ORAL | Status: DC
  Filled 2019-07-31 (×3): qty 1

## 2019-07-31 MED ORDER — BISACODYL 5 MG PO TBEC
5.0000 mg | DELAYED_RELEASE_TABLET | Freq: Every day | ORAL | Status: DC | PRN
  Filled 2019-07-31: qty 1

## 2019-07-31 MED ORDER — SODIUM CHLORIDE 0.9 % IV SOLN
1.0000 g | INTRAVENOUS | Status: AC
  Administered 2019-07-31 – 2019-08-11 (×9): 1 g via INTRAVENOUS
  Filled 2019-07-31 (×12): qty 1

## 2019-07-31 MED ORDER — CEFTAZIDIME IV (FOR PTA / DISCHARGE USE ONLY): 1.0000 g | INTRAVENOUS | Status: DC

## 2019-07-31 NOTE — ED Triage Notes (Signed)
Pt here with pain in his right leg below his knee, is to have vascular surgery tomorrow to restore blood flow back to leg per pt, he states it hurts so bad it's making him nauseated. Appears in pain in triage.

## 2019-07-31 NOTE — Progress Notes (Addendum)
[facility] Daily Progress Note   Subjective: The patient is a 59 year old male with multiple medical issues last seen in the outpatient setting with Dr. [name] on July 03, 2019.  Patient with a known past medical history of bilateral lower extremity atherosclerotic disease.  On July 03, 2019 patient presented with progressively worsening left lower extremity claudication / ulceration. On 07/18/19 the patient underwent:  1. Introduction catheter into left lower extremity 3rd order catheter placement  2. Contrast injection left lower extremity for distal runoff with additional 3rd order  3. Retrieval portion of CXI catheter from left SFA with a trilobed snare              4. Percutaneous transluminal angioplasty and stent placement left anterior tibial  5. Thromboembolectomy with the penumbra cat 6 device left anterior tibial              6.  Star close closure right common femoral arteriotomy  Patient recently seen by his podiatrist [name] who noted gangrenous changes to the first toe on the left foot.  The patient was then placed on Dr. Hal Morales schedule for August 01, 2019 to undergo right lower extremity angiogram with possible intervention.  Patient notes progressively worsening pain over the weekend prompting him to seek medical attention in our emergency department today. Denies any worsening to his first left toe.  Denies any fever, nausea vomiting.  Covid test was negative.  Objective: Vitals:   07/31/19 1145 07/31/19 1200 07/31/19 1345 07/31/19 1400  BP:  130/60 (!) 116/59 (!) 99/30  Pulse: 76 76 83 78  Resp:   20 20  Temp:    97.7 F (36.5 C)  TempSrc:    Oral  SpO2: 93% 93% 97% 99%  Weight:      Height:       No intake or output data in the 24 hours ending 07/31/19 1518  Physical Exam: A&Ox3, NAD CV: RRR Pulmonary: CTA Bilaterally Abdomen: Soft, Nontender,  Nondistended Vascular:  Right Lower Extremity: Thigh soft.  Calf soft.  Extremity is warm distally until approximately the ankle and becomes cooler.  Unable to palpate DP or PT pulse.  Dopplerable PT and DP.  Tip of first toe has dry gangrene.  Sensory/motor is intact.   Laboratory: CBC    Component Value Date/Time   WBC 11.7 (H) 07/31/2019 0817   HGB 8.4 (L) 07/31/2019 0817   HGB 10.7 (L) 12/13/2014 0102   HCT 26.9 (L) 07/31/2019 0817   HCT 33.8 (L) 12/13/2014 0102   PLT 266 07/31/2019 0817   PLT 510 (H) 12/13/2014 0102   BMET    Component Value Date/Time   NA 136 07/31/2019 0817   NA 140 04/16/2015   NA 140 12/13/2014 0102   K 4.6 07/31/2019 0817   K 4.6 12/13/2014 0102   CL 99 07/31/2019 0817   CL 111 12/13/2014 0102   CO2 23 07/31/2019 0817   CO2 23 12/13/2014 0102   GLUCOSE 216 (H) 07/31/2019 0817   GLUCOSE 213 (H) 12/13/2014 0102   BUN 60 (H) 07/31/2019 0817   BUN 44 (A) 04/16/2015   BUN 25 (H) 12/13/2014 0102   CREATININE 7.36 (H) 07/31/2019 0817   CREATININE 1.95 (H) 12/13/2014 0102   CALCIUM 7.5 (L) 07/31/2019 0817   CALCIUM 8.0 (L) 12/13/2014 0102   GFRNONAA 7 (L) 07/31/2019 0817   GFRNONAA 37 (L) 12/13/2014 0102   GFRAA 8 (L) 07/31/2019 0817   GFRAA 43 (L) 12/13/2014  0102   Assessment/Planning: The patient is a 58 year old male with multiple medical issues including known bilateral lower extremity atherosclerotic disease with ulceration.  Was originally on the schedule for tomorrow to undergo a right lower extremity angiogram with possible intervention for progressively worsening right lower extremity claudication-like symptoms with dry gangrene of the first big toe 1) atherosclerotic disease of the lower extremity: Patient presented to the emergency department today due to progressively worsening right lower extremity pain.  Admit to hospitalist team with initiation of heparin.  Pain control.  We will continue to plan on a right lower extremity angiogram with  possible intervention with Dr. [name] tomorrow.  Procedure, risks and benefits explained to the patient.  All questions answered.  The patient wishes to proceed.  Patient is preoped. 2) end-stage renal disease: Patient is on peritoneal dialysis.  Nephrology has been consulted.  Discussed with Dr. Eber Hong  PA-C 07/31/2019 3:18 PM

## 2019-07-31 NOTE — Progress Notes (Signed)
[facility]  ROUNDING NOTE   Subjective:   [name] admitted to [facility] on 07/31/2019 for rt leg pain  Scheduled for angiogram for tomorrow.   Objective:  Vital signs in last 24 hours:  Temp:  [97.7 F (36.5 C)] 97.7 F (36.5 C) (11/30 1400) Pulse Rate:  [75-85] 83 (11/30 1830) Resp:  [16-20] 20 (11/30 1400) BP: (99-143)/(30-63) 105/33 (11/30 1830) SpO2:  [93 %-100 %] 96 % (11/30 1830) Weight:  [98 kg] 98 kg (11/30 0748)  Weight change:  Filed Weights   07/31/19 0748  Weight: 98 kg    Intake/Output: No intake/output data recorded.   Intake/Output this shift:  No intake/output data recorded.  Physical Exam: General: NAD,   Head: Normocephalic, atraumatic. Moist oral mucosal membranes  Eyes: Anicteric, PERRL  Neck: Supple, trachea midline  Lungs:  Clear to auscultation  Heart: Regular rate and rhythm  Abdomen:  Soft, nontender,   Extremities:  Left foot in clean and dry dressings Right foot +erythema and +edema  Neurologic: Nonfocal, moving all four extremities  Skin: No lesions  Access: PD catheter    Basic Metabolic Panel: Recent Labs  Lab 07/31/19 0817  NA 136  K 4.6  CL 99  CO2 23  GLUCOSE 216*  BUN 60*  CREATININE 7.36*  CALCIUM 7.5*    Liver Function Tests: Recent Labs  Lab 07/31/19 0817  AST 23  ALT 19  ALKPHOS 110  BILITOT 0.8  PROT 6.4*  ALBUMIN 2.6*   No results for input(s): LIPASE, AMYLASE in the last 168 hours. No results for input(s): AMMONIA in the last 168 hours.  CBC: Recent Labs  Lab 07/31/19 0817  WBC 11.7*  HGB 8.4*  HCT 26.9*  MCV 98.2  PLT 266    Cardiac Enzymes: No results for input(s): CKTOTAL, CKMB, CKMBINDEX, TROPONINI in the last 168 hours.  BNP: Invalid input(s): POCBNP  CBG: Recent Labs  Lab 07/31/19 1430 07/31/19 1706  GLUCAP 107* 124*    Microbiology: Results for orders placed or performed during the hospital encounter of 07/28/19  SARS CORONAVIRUS 2 (TAT 6-24 HRS)  Nasopharyngeal Nasopharyngeal Swab     Status: None   Collection Time: 07/28/19 11:24 AM   Specimen: Nasopharyngeal Swab  Result Value Ref Range Status   SARS Coronavirus 2 NEGATIVE NEGATIVE Final    Comment: (NOTE) SARS-CoV-2 target nucleic acids are NOT DETECTED. The SARS-CoV-2 RNA is generally detectable in upper and lower respiratory specimens during the acute phase of infection. Negative results do not preclude SARS-CoV-2 infection, do not rule out co-infections with other pathogens, and should not be used as the sole basis for treatment or other patient management decisions. Negative results must be combined with clinical observations, patient history, and epidemiological information. The expected result is Negative. Fact Sheet for Patients: SugarRoll.be Fact Sheet for Healthcare Providers: https://www.woods-mathews.com/ This test is not yet approved or cleared by the [place] FDA and  has been authorized for detection and/or diagnosis of SARS-CoV-2 by FDA under an Emergency Use Authorization (EUA). This EUA will remain  in effect (meaning this test can be used) for the duration of the COVID-19 declaration under Section 56 4(b)(1) of the Act, 21 U.S.C. section 360bbb-3(b)(1), unless the authorization is terminated or revoked sooner. Performed at [facility], [place] [address] [place], [place] [address]     Coagulation Studies: Recent Labs    07/31/19 0817  LABPROT 14.1  INR 1.1    Urinalysis: No results for input(s):  COLORURINE, LABSPEC, [name], GLUCOSEU, HGBUR, BILIRUBINUR, KETONESUR, PROTEINUR, UROBILINOGEN, NITRITE, LEUKOCYTESUR in the last 72 hours.  Invalid input(s): APPERANCEUR    Imaging: No results found.   Medications:   . cefTAZidime (FORTAZ)  IV Stopped (07/31/19 1633)  . heparin 1,450 Units/hr (07/31/19 1140)   . aspirin EC  81 mg Oral Daily  . atorvastatin  40 mg Oral Daily  . calcitRIOL   0.25 mcg Oral Daily  . calcium acetate  2,001 mg Oral TID WC  . carvedilol  25 mg Oral BID WC  . [START ON 08/01/2019] clopidogrel  75 mg Oral Q breakfast  . hydrALAZINE  25 mg Oral TID  .  HYDROmorphone (DILAUDID) injection  1 mg Intravenous Once  . insulin aspart  0-5 Units Subcutaneous QHS  . insulin aspart  0-9 Units Subcutaneous TID WC  . insulin aspart  2 Units Subcutaneous TID WC  . isosorbide mononitrate  30 mg Oral Daily  . pantoprazole  40 mg Oral Daily  . sulfamethoxazole-trimethoprim  1 tablet Oral Daily  . tamsulosin  0.4 mg Oral Daily  . vancomycin variable dose per unstable renal function (pharmacist dosing)   Does not apply See admin instructions   acetaminophen **OR** acetaminophen, albuterol, bisacodyl, calcium acetate, HYDROcodone-acetaminophen, ondansetron **OR** ondansetron (ZOFRAN) IV, oxyCODONE-acetaminophen, senna-docusate, traMADol  Assessment/ Plan:  [name] is a 58 y.o. white male with end stage renal disease on peritoneal dialysis, hypertension, diabetes mellitus type II, peripheral vascular disease, left toe amputation, who is admitted to [facility] on 07/31/2019 for rt leg pain  [place] PD CAPD 4 exchanges 2068mL fills  1. End Stage Renal Disease: peritoneal dialysis orders prepared. Initiate dialysis once patient is in an inpatient room.   2. Peripheral vascular disease: left lower extremity healing well. However now with erythema and ischemic changes to his right first toe.  Discharged on IP ceftazidime and vancomycin. He was administrating this at home.  - TMP/SMX, vanco, ceftazidime.  - Appreciate ID and vascular input. Angiogram scheduled for tomorrow.   3. Hypertension: blood pressure soft. Carvedilol and isosorbide mononitrate ordered.   4. Anemia of chronic kidney disease: hemoglobin 8.4 - Aranesp as outpatient.   5. Secondary Hyperparathyroidism with hypocalcemia. Corrected calcium of 8.5 - calcitriol - calcium acetate  with meals.    LOS: 0 Scott [name] 11/30/20207:18 PM

## 2019-07-31 NOTE — Consult Note (Signed)
ANTICOAGULATION CONSULT NOTE - Initial Consult  Pharmacy Consult for Heparin Drip Indication: Ischemic Leg  No Known Allergies  Patient Measurements: Height: 5\' 9"  (175.3 cm) Weight: 216 lb (98 kg) IBW/kg (Calculated) : 70.7 Heparin Dosing Weight: 91.3 kg  Vital Signs: Temp: 97.7 F (36.5 C) (11/30 0748) Temp Source: Oral (11/30 0748) BP: 136/54 (11/30 0953) Pulse Rate: 78 (11/30 0953)  Labs: Recent Labs    07/31/19 0817  HGB 8.4*  HCT 26.9*  PLT 266  LABPROT 14.1  INR 1.1  CREATININE 7.36*    Estimated Creatinine Clearance: 12.3 mL/min (A) (by C-G formula based on SCr of 7.36 mg/dL (H)).   Medical History: Past Medical History:  Diagnosis Date  . Acute respiratory failure with hypoxia ([place]) 09/17/2018  . Arthritis    "left arm; right leg" (12/13/2014)  . Asthma   . CHF (congestive heart failure) ([place])   . Chronic disease anemia    Scott [name] 12/13/2014  . Chronic kidney disease (CKD), stage IV (severe) ([place])    Scott [name] 12/13/2014... on dialysis  . Complication of anesthesia    unable to urinate after CAPD urgery  . Continuous ambulatory peritoneal dialysis status ([place])   . Coronary artery disease    Scott [name] 12/13/2014  . Depression   . Dysrhythmia    patient unaware of irregular heartbeat  . GERD (gastroesophageal reflux disease)   . High cholesterol    Scott [name] 12/13/2014  . Hypertension   . Non-Q wave myocardial infarction ([place])    Scott [name] 12/13/2014  . PVD (peripheral vascular disease) ([place])    Scott [name] 12/13/2014  . Type II diabetes mellitus ([facility])     Medications:  No PTA anticoagulant meds per record  Assessment: Scott [name] is a 58 y.o. male with a history of CHF, CKD, GERD, hypertension, PAD who comes the ED complaining of worsening of his chronic right leg pain which is below the knee and all the way down to the foot.  Over the last 2 days it has rapidly worsened and is now intractable, unbearable.  Pharmacy has been consulted to initiate/monitor a  full-dose heparin drip for ischemic leg.  Goal of Therapy:  Heparin level 0.3-0.7 units/ml Monitor platelets by anticoagulation protocol: Yes   Plan:  Give 5000 units bolus x 1, followed by 1450 units/hr.  Will follow heparin level (HL) in 6 hours per protocol and daily CBC's while on heparin drip.  Scott [name], PharmD, BCPS Clinical Pharmacist 07/31/2019 9:59 AM

## 2019-07-31 NOTE — Progress Notes (Signed)
md notified of bp. Ok with transfer to floor

## 2019-07-31 NOTE — ED Notes (Signed)
Consent obtained from pt for tomorrow's procedure.

## 2019-07-31 NOTE — ED Notes (Addendum)
Pt given dinner tray and drink. Pt states nausea dec.

## 2019-07-31 NOTE — ED Notes (Signed)
Pt has had issues with the right foot "for some time now" pt was due to has vascular intervention tomorrow but the pain was so severe he was not able to wait, pt has noted black coloration to the distal half of his left great toe on arrival with post amputation of right 4th toe.

## 2019-07-31 NOTE — ED Notes (Addendum)
Feet bilaterally warm/equal pink/white color with black necrotic tissues at R foot's great toe. Pt actively vomiting again. Attending provider notified zofran given without relief. Awaiting orders. Charge RN states pt is next up to get Telemetry bed per [facility] which will allow for peritoneal dialysis.

## 2019-07-31 NOTE — Progress Notes (Signed)
[facility]  Communication Note: Please refer to 07/03/19 office note by Dr. [name] Patient on schedule tomorrow (08/01/19) for right lower extremity angiogram with possible intervention. Please initiate heparin gtt. Full consult to follow.   Discussed with Dr. Eber Hong Lacie Landry PA-C 07/31/2019 11:15 AM

## 2019-07-31 NOTE — Progress Notes (Signed)
Report called to vivian rn

## 2019-07-31 NOTE — Progress Notes (Signed)
Pd started 

## 2019-07-31 NOTE — Progress Notes (Signed)
Pharmacy Antibiotic Note  Scott [name] is a 58 y.o. male admitted on 07/31/2019 with osteomyelitis. He has ESRD for which he undergoes peritoneal dialysis and receives this treatment overnight. 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 He underwent a successful recanalization of the left lower extremity for limb salvage performed by Dr [name] on 11/17. On 07/19/2019 he was taken for surgery by podiatry and underwent I&D of the infected bone and soft tissue of the left first metatarsal and amputation of left second toe with partial ray resection.  Wound cultures were sent from both and grew Achromobacter spp (started on TMP/SMZ as outpatient) and MRSE.  He was receiving ceftazidime and vancomycin via peritoneal dialysis.    Vancomycin random level today = 29 mcg/ml (patient unable to provide day of last vancomycin dose)  Plan: 1.  Hold vancomycin and check random level 12/2 am 2. Continue bactrim 1SS tab daily and ceftazidime 1gm IV q24h  Height: 5\' 9"  (175.3 cm) Weight: 216 lb (98 kg) IBW/kg (Calculated) : 70.7  Temp (24hrs), Avg:97.7 F (36.5 C), Min:97.7 F (36.5 C), Max:97.7 F (36.5 C)  Recent Labs  Lab 07/31/19 0817 07/31/19 1540  WBC 11.7*  --   CREATININE 7.36*  --   VANCORANDOM  --  28    Estimated Creatinine Clearance: 12.3 mL/min (A) (by C-G formula based on SCr of 7.36 mg/dL (H)).    Antimicrobials this admission: 11/16 ceftriaxone >> 11/19 11/16 vancomycin >>  11/20 cefepime >>  Microbiology results: 11/13 SARS CoV-2: negative  11/18 WCx: Ahcromobacter (TMP/SMZ susc), MRSE.   11/17 MRSA PCR: negative  Thank you for allowing pharmacy to be a part of this patient's care.  Doreene Eland, PharmD, BCPS.   Work Cell: [phone] 07/31/2019 5:53 PM

## 2019-07-31 NOTE — ED Provider Notes (Signed)
[facility] Emergency Department Provider Note  ____________________________________________  Time seen: Approximately 9:39 AM  I have reviewed the triage vital signs and the nursing notes.   HISTORY  Chief Complaint Leg Pain    HPI Scott [name] is a 58 y.o. male with a history of CHF, CKD, GERD, hypertension, PAD who comes the ED complaining of worsening of his chronic right leg pain which is below the knee and all the way down to the foot.  Over the last 2 days it has rapidly worsened and is now intractable, unbearable.  He has not slept in the last 2 days.  No aggravating or alleviating factors.  No recent trauma.  No chest pain shortness of a fevers or chills.  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      Past Medical History:  Diagnosis Date  . Acute respiratory failure with hypoxia ([place]) 09/17/2018  . Arthritis    "left arm; right leg" (12/13/2014)  . Asthma   . CHF (congestive heart failure) ([place])   . Chronic disease anemia    [name] 12/13/2014  . Chronic kidney disease (CKD), stage IV (severe) ([place])    [name] 12/13/2014... on dialysis  . Complication of anesthesia    unable to urinate after CAPD urgery  . Continuous ambulatory peritoneal dialysis status ([place])   . Coronary artery disease    [name] 12/13/2014  . Depression   . Dysrhythmia    patient unaware of irregular heartbeat  . GERD (gastroesophageal reflux disease)   . High cholesterol    [name] 12/13/2014  . Hypertension   . Non-Q wave myocardial infarction ([place])    [name] 12/13/2014  . PVD (peripheral vascular disease) ([place])    [name] 12/13/2014  . Type II diabetes mellitus [facility])      Patient Active Problem List   Diagnosis Date Noted  . PAD (peripheral artery disease) ([place]) 07/17/2019  . Osteomyelitis of second toe of left foot ([place]) 07/17/2019  . Type II diabetes mellitus ([place])   . PVD (peripheral vascular disease) ([place])   . Non-Q wave myocardial infarction  ([place])   . Hypertension   . Coronary artery disease   . Ulcerated, foot, right, with necrosis of muscle ([place])   . Atherosclerosis of artery of extremity with ulceration ([place]) 07/03/2019  . Anemia 04/15/2019  . Cellulitis 01/21/2019  . History of colonic polyps   . Positive fecal occult blood test   . 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 Atherosclerotic heart disease of native coronary artery without angina pectoris 06/02/2017  . Encounter for screening for depression 06/02/2017  . Gastro-esophageal reflux disease with esophagitis 06/02/2017  . Other asthma 06/02/2017  . Other specified arthritis, unspecified site 06/02/2017  . Acidosis 05/31/2017  . Anemia in ESRD (end-stage renal disease) ([place]) 05/31/2017  . Hyperkalemia 05/31/2017  . Iron deficiency anemia, unspecified 05/31/2017  . Liver disease, unspecified 05/31/2017  . Moderate protein-calorie malnutrition ([place]) 05/31/2017  . Other dietary vitamin B12 deficiency anemia 05/31/2017  . Other disorders of electrolyte and fluid balance, not elsewhere classified 05/31/2017  . Other disorders resulting from impaired renal tubular function 05/31/2017  . Other hyperlipidemia 05/31/2017  . Other long term (current) drug therapy 05/31/2017  . Secondary hyperparathyroidism of renal origin ([place]) 05/31/2017  . Unspecified jaundice 05/31/2017  . ESRD (end stage renal disease) ([place]) 11/25/2016  . Tobacco abuse 11/25/2016  . Non-ST elevation (NSTEMI) myocardial infarction ([place]) 10/21/2016  .  Pure hypercholesterolemia 10/21/2016  . Type 2 diabetes mellitus with other diabetic kidney complication ([place]) 38/18/2993  . GERD (gastroesophageal reflux disease) 04/04/2015     Past Surgical History:  Procedure Laterality Date  . AMPUTATION TOE Left 05/24/2019   Procedure: Left great toe amputation;  Surgeon: Caroline More, DPM;  Location: [facility] ORS;  Service: Podiatry;  Laterality: Left;  .  AMPUTATION TOE Left 07/19/2019   Procedure: AMPUTATION 2nd TOE PARTIAL RAY RESECTION;  Surgeon: Sharlotte Alamo, DPM;  Location: [facility];  Service: Podiatry;  Laterality: Left;  . AV FISTULA PLACEMENT Right 04/07/2017   Procedure: ARTERIOVENOUS (AV) FISTULA CREATION ( BRACHIALCEPHALIC );  Surgeon: Katha Cabal, MD;  Location: [facility] OR[facility];  Service: Vascular;  Laterality: Right;  . CAPD INSERTION N/A 04/07/2017   Procedure: LAPAROSCOPIC INSERTION CONTINUOUS AMBULATORY PERITONEAL DIALYSIS  (CAPD) CATHETER;  Surgeon: Katha Cabal, MD;  Location: [facility] OR[facility];  Service: Vascular;  Laterality: N/A;  . CARDIAC CATHETERIZATION  11/2014  . COLONOSCOPY WITH PROPOFOL N/A 12/01/2017   Procedure: COLONOSCOPY WITH PROPOFOL;  Surgeon: Lin Landsman, MD;  Location: [place];  Service: Endoscopy;  Laterality: N/A;  Diabetic - insulin  . COLONOSCOPY WITH PROPOFOL N/A 12/29/2017   Procedure: COLONOSCOPY WITH PROPOFOL;  Surgeon: Lin Landsman, MD;  Location: [place];  Service: Endoscopy;  Laterality: N/A;  . DIALYSIS/PERMA CATHETER INSERTION N/A 01/18/2017   Procedure: Dialysis/Perma Catheter Insertion;  Surgeon: Algernon Huxley, MD;  Location: [place] [facility];  Service: Cardiovascular;  Laterality: N/A;  . DIALYSIS/PERMA CATHETER REMOVAL N/A 07/26/2017   Procedure: DIALYSIS/PERMA CATHETER REMOVAL;  Surgeon: Algernon Huxley, MD;  Location: [place] [facility];  Service: Cardiovascular;  Laterality: N/A;  . INCISION AND DRAINAGE OF WOUND Right ~ 10/2014   "4th toe foot"  . IRRIGATION AND DEBRIDEMENT FOOT Left 07/19/2019   Procedure: IRRIGATION AND DEBRIDEMENT FOOT;  Surgeon: Sharlotte Alamo, DPM;  Location: [facility];  Service: Podiatry;  Laterality: Left;  . LOWER EXTREMITY ANGIOGRAPHY Left 05/23/2019   Procedure: Lower Extremity Angiography;  Surgeon: Katha Cabal, MD;  Location: [place] [facility];  Service: Cardiovascular;  Laterality: Left;  . LOWER EXTREMITY ANGIOGRAPHY Left  07/18/2019   Procedure: LOWER EXTREMITY ANGIOGRAPHY;  Surgeon: Katha Cabal, MD;  Location: [place] [facility];  Service: Cardiovascular;  Laterality: Left;  . PERCUTANEOUS CORONARY STENT INTERVENTION (PCI-S) N/A 12/14/2014   Procedure: PERCUTANEOUS CORONARY STENT INTERVENTION (PCI-S);  Surgeon: Charolette Forward, MD;  Location: [facility];  Service: Cardiovascular;  Laterality: N/A;  . PERCUTANEOUS CORONARY STENT INTERVENTION (PCI-S) N/A 12/18/2014   Procedure: PERCUTANEOUS CORONARY STENT INTERVENTION (PCI-S);  Surgeon: Charolette Forward, MD;  Location: [facility] CATH LAB;  Service: Cardiovascular;  Laterality: N/A;  . POLYPECTOMY  12/01/2017   Procedure: POLYPECTOMY;  Surgeon: Lin Landsman, MD;  Location: [place];  Service: Endoscopy;;  . POLYPECTOMY  12/29/2017   Procedure: POLYPECTOMY;  Surgeon: Lin Landsman, MD;  Location: [place];  Service: Endoscopy;;  . TOE AMPUTATION Right 2015   4th toe     Prior to Admission medications   Medication Sig Start Date End Date Taking? Authorizing Provider  albuterol (PROVENTIL HFA;VENTOLIN HFA) 108 (90 BASE) MCG/ACT inhaler Inhale 2 puffs into the lungs every 6 (six) hours as needed for wheezing or shortness of breath. 01/10/15   Gladstone Lighter, MD  aspirin EC 81 MG tablet Take 1 tablet (81 mg total) by mouth daily. 07/21/19   Ivor Costa, MD  atorvastatin (LIPITOR) 40  MG tablet Take 40 mg by mouth daily. 05/15/19   [provider]  B Complex-C-Folic Acid (DIALYVITE 161) 0.8 MG TABS Take 1 tablet by mouth daily. 10/25/17   [provider]  calcitRIOL (ROCALTROL) 0.25 MCG capsule Take 0.25 mcg by mouth daily.    [provider]  calcium acetate (PHOSLO) 667 MG capsule Take 667-2,001 mg by mouth See admin instructions. Take 2,001 mg by mouth 3 times daily with meals and [phone] mg with snacks 06/08/17   [provider]  carvedilol (COREG) 25 MG tablet Take 1 tablet (25 mg total) by mouth 2  (two) times daily with a meal. 08/30/17   Alisa Graff, FNP  cefTAZidime (FORTAZ) IVPB Inject 1 g into the vein daily for 14 days. Indication:  cellulitis Last Day of Therapy:  08/07/19 Please Administer this Antibiotic Intraperitoneally 07/24/19 08/07/19  Ivor Costa, MD  Cholecalciferol (VITAMIN D) 2000 units tablet Take 2,000 Units by mouth daily.     [provider]  clopidogrel (PLAVIX) 75 MG tablet Take 1 tablet (75 mg total) by mouth daily with breakfast. 05/27/19   Dustin Flock, MD  furosemide (LASIX) 80 MG tablet Take 80 mg by mouth daily. 12/01/18   [provider]  gentamicin cream (GARAMYCIN) 0.1 % Apply 1 application topically daily. 03/02/18   [provider]  hydrALAZINE (APRESOLINE) 25 MG tablet Take 1 tablet (25 mg total) by mouth 3 (three) times daily. Patient taking differently: Take 25 mg by mouth daily as needed (148 or higher).  07/21/19 10/19/19  Ivor Costa, MD  insulin aspart protamine- aspart (NOVOLOG MIX 70/30) (70-30) 100 UNIT/ML injection Inject 0.25 mLs (25 Units total) into the skin 2 (two) times daily with a meal. Patient taking differently: Inject 2 Units into the skin 2 (two) times daily as needed (blood sugar of 140 or higher).  03/12/18   Fritzi Mandes, MD  isosorbide mononitrate (IMDUR) 30 MG 24 hr tablet Take 1 tablet (30 mg total) by mouth daily. 06/23/19 09/21/19  Kate Sable, MD  mupirocin ointment (BACTROBAN) 2 % Place 1 application into the nose 2 (two) times daily. 07/21/19   Ivor Costa, MD  nitroGLYCERIN (NITROSTAT) 0.4 MG SL tablet Place 0.4 mg under the tongue every 5 (five) minutes as needed for chest pain. Pt needs new Rx. Bottle has expried    [provider]  Nutritional Supplements (FEEDING SUPPLEMENT, NEPRO CARB STEADY,) LIQD Take 237 mLs by mouth 2 (two) times daily between meals for 15 days. Patient taking differently: Take 237 mLs by mouth 3 (three) times a week.  07/22/19 08/06/19  Ivor Costa, MD  nystatin  (MYCOSTATIN) 100000 UNIT/ML suspension Take 5 mLs (500,000 Units total) by mouth 3 (three) times daily for 14 days. 07/24/19 08/07/19  Ivor Costa, MD  omeprazole (PRILOSEC) 20 MG capsule Take 1 capsule (20 mg total) by mouth daily. 01/10/15   Gladstone Lighter, MD  sulfamethoxazole-trimethoprim (BACTRIM) 400-80 MG tablet Take 1 tablet by mouth daily. 07/26/19   Tsosie Billing, MD  tamsulosin (FLOMAX) 0.4 MG CAPS capsule Take 1 capsule (0.4 mg total) by mouth daily. 06/19/19   Hollice Espy, MD  traMADol (ULTRAM) 50 MG tablet Take 50 mg by mouth every 6 (six) hours as needed for moderate pain.  07/06/19   [provider]  vancomycin IVPB Inject 2,000 mg into the vein 2 (two) times a week for 4 doses. Indication:  cellulits Last Day of Therapy:  08/04/19 Please Administer Antibiotic Intraperitoneally 07/24/19 08/04/19  Ivor Costa,  MD  vitamin C (VITAMIN C) 500 MG tablet Take 1 tablet (500 mg total) by mouth 2 (two) times daily. 07/21/19   Ivor Costa, MD     Allergies Patient has no known allergies.   Family History  Problem Relation Age of Onset  . Cancer Mother        Lung  . Cancer Father        Lung  . Diabetes Sister   . Diabetes Brother   . CAD Brother   . Hernia Son   . Cancer Brother     Social History Social History   Tobacco Use  . Smoking status: Former Smoker    Packs/day: 1.00    Years: 30.00    Pack years: 30.00    Types: Cigarettes    Start date: 08/31/1984    Quit date: 11/30/2014    Years since quitting: 4.6  . Smokeless tobacco: Never Used  Substance Use Topics  . Alcohol use: Not Currently  . Drug use: No    Review of Systems  Constitutional:   No fever or chills.  ENT:   No sore throat. No rhinorrhea. Cardiovascular:   No chest pain or syncope. Respiratory:   No dyspnea or cough. Gastrointestinal:   Negative for abdominal pain, vomiting and diarrhea.  Musculoskeletal: Right leg pain as above All other systems reviewed and are negative  except as documented above in ROS and HPI.  ____________________________________________   PHYSICAL EXAM:  VITAL SIGNS: ED Triage Vitals  Enc Vitals Group     BP 07/31/19 0748 (!) 136/47     Pulse Rate 07/31/19 0748 85     Resp 07/31/19 0748 16     Temp 07/31/19 0748 97.7 F (36.5 C)     Temp Source 07/31/19 0748 Oral     SpO2 07/31/19 0748 100 %     Weight 07/31/19 0748 216 lb (98 kg)     Height 07/31/19 0748 5\' 9"  (1.753 m)     Head Circumference --      Peak Flow --      Pain Score 07/31/19 0801 10     Pain Loc --      Pain Edu? --      Excl. in [place]? --     Vital signs reviewed, nursing assessments reviewed.   Constitutional:   Alert and oriented.  Uncomfortable but non-toxic appearance. Eyes:   Conjunctivae are normal. EOMI. PERRL. ENT      Head:   Normocephalic and atraumatic.      Nose:   Wearing a mask.      Mouth/Throat:   Wearing a mask.      Neck:   No meningismus. Full ROM. Hematological/Lymphatic/Immunilogical:   No cervical lymphadenopathy. Cardiovascular:   RRR. Absent palpable pulse in R DP, PT, popliteal. Poor RLE cap refill. R foot and ankle cool to touch but no frank pallor. Good biphasic doppler pulse in R DP and PT. Respiratory:   Normal respiratory effort without tachypnea/retractions. Breath sounds are clear and equal bilaterally. No wheezes/rales/rhonchi. Gastrointestinal:   Soft and nontender. Non distended. There is no CVA tenderness.  No rebound, rigidity, or guarding.  Musculoskeletal:   Normal range of motion in all extremities. No joint effusions.  Tenderness in the right lower leg.  No inflammatory skin changes.  Dry gangrene of distal great toe without purulence.  No edema. Neurologic:   Normal speech and language.  Motor grossly intact. No acute focal neurologic deficits are appreciated.  Skin:  Skin is cool in the right foot, warm elsewhere.  Dry.  Intact except for necrotic ischemic ulcer in the right great toe.  No signs of cellulitis  ____________________________________________    LABS (pertinent positives/negatives) (all labs ordered are listed, but only abnormal results are displayed) Labs Reviewed  CBC - Abnormal; Notable for the following components:      Result Value   WBC 11.7 (*)    RBC 2.74 (*)    Hemoglobin 8.4 (*)    HCT 26.9 (*)    RDW 16.1 (*)    All other components within normal limits  COMPREHENSIVE METABOLIC PANEL - Abnormal; Notable for the following components:   Glucose, Bld 216 (*)    BUN 60 (*)    Creatinine, Ser 7.36 (*)    Calcium 7.5 (*)    Total Protein 6.4 (*)    Albumin 2.6 (*)    GFR calc non Af Amer 7 (*)    GFR calc Af Amer 8 (*)    All other components within normal limits  PROTIME-INR   ____________________________________________   EKG    ____________________________________________    RADIOLOGY  No results found.  ____________________________________________   PROCEDURES .Critical Care Performed by: Carrie Mew, MD Authorized by: Carrie Mew, MD   Critical care provider statement:    Critical care time (minutes):  35   Critical care time was exclusive of:  Separately billable procedures and treating other patients   Critical care was necessary to treat or prevent imminent or life-threatening deterioration of the following conditions:  Circulatory failure   Critical care was time spent personally by me on the following activities:  Development of treatment plan with patient or surrogate, discussions with consultants, evaluation of patient's response to treatment, examination of patient, obtaining history from patient or surrogate, ordering and performing treatments and interventions, ordering and review of laboratory studies, ordering and review of radiographic studies, pulse oximetry, re-evaluation of patient's condition and review of old charts    ____________________________________________    CLINICAL IMPRESSION / [place] / ED  COURSE  Medications ordered in the ED: Medications  oxyCODONE-acetaminophen (PERCOCET/ROXICET) 5-325 MG per tablet 1 tablet (1 tablet Oral Given 07/31/19 0810)  ondansetron (ZOFRAN-ODT) disintegrating tablet 4 mg (4 mg Oral Given 07/31/19 9242)    Pertinent labs & imaging results that were available during my care of the patient were reviewed by me and considered in my medical decision making (see chart for details).  Scott [name] was evaluated in Emergency Department on 07/31/2019 for the symptoms described in the history of present illness. He was evaluated in the context of the global COVID-19 pandemic, which necessitated consideration that the patient might be at risk for infection with the SARS-CoV-2 virus that causes COVID-19. Institutional protocols and algorithms that pertain to the evaluation of patients at risk for COVID-19 are in a state of rapid change based on information released by regulatory bodies including the CDC and federal and state organizations. These policies and algorithms were followed during the patient's care in the ED.     Clinical Course as of Jul 30 938  [facility] Jul 31, 2019  0904 Pt p/w worsening right lower leg and foot pain for the past 2 days, rated as severe and unbearable.  The right lower leg and foot are cool with poor capillary refill, but there is dopplerable pulse in the dorsalis pedis and posterior tibialis arteries. Will d/w vascular   [PS]    Clinical Course User Index [  PS] Carrie Mew, MD    ----------------------------------------- 9:47 AM on 07/31/2019 -----------------------------------------  Discussed with vascular Dr. [name] who agrees with heparin and hospitalization and pain control.  Does not need updating Covid since he was -3 days ago.  Discussed with hospitalist for further management.   ____________________________________________   FINAL CLINICAL IMPRESSION(S) / ED DIAGNOSES    Final diagnoses:  Right leg pain   Ischemic leg pain  Dry gangrene [facility])     ED Discharge Orders    None      Portions of this note were generated with dragon dictation software. Dictation errors may occur despite best attempts at proofreading.   Carrie Mew, MD 07/31/19 [id number]

## 2019-07-31 NOTE — H&P (Signed)
History and Physical    JACOB CICERO FGH:[id number] DOB: November 20, 1960 DOA: 07/31/2019  PCP: [facility] [employer] Patient coming from: Home  Chief Complaint: Right lower extremity pain  HPI: Scott [name] is a 58 y.o. male with medical history significant of peripheral arterial disease, diabetes mellitus type 2, ESRD on peritoneal dialysis presents to the hospital with complaints of right lower extremity pain.  Patient was admitted here last week for left lower extremity pain diagnosed with osteomyelitis requiring angiogram by vascular followed by amputation of toes by podiatry.  He was eventually discharged on IV antibiotics.  He had been feeling better but started experiencing right lower extremity pain over the last couple of days.  Initially pain was with exertion but now has progressed to persistent and his toes have darkened.  In the ED his physical examination was consistent with right lower extremity gangrenous toes.  Vascular surgery were consulted inpatient admission, starting heparin drip and intervention tomorrow. Nephrology was also consulted for dialysis.  When I saw the patient he was comfortable denied having any complaints besides mild pain in the right lower extremity   Review of Systems: As per HPI otherwise 10 point review of systems negative.  Review of Systems Otherwise negative except as per HPI, including: General: Denies fever, chills, night sweats or unintended weight loss. Resp: Denies cough, wheezing, shortness of breath. Cardiac: Denies chest pain, palpitations, orthopnea, paroxysmal nocturnal dyspnea. GI: Denies abdominal pain, nausea, vomiting, diarrhea or constipation GU: Denies dysuria, frequency, hesitancy or incontinence MS: Right lower extremity pain Neuro: Denies headache, neurologic deficits (focal weakness, numbness, tingling), abnormal gait Psych: Denies anxiety, depression, SI/HI/AVH Skin: Denies new rashes or lesions ID: Denies sick  contacts, exotic exposures, travel  Past Medical History:  Diagnosis Date  . Acute respiratory failure with hypoxia ([place]) 09/17/2018  . Arthritis    "left arm; right leg" (12/13/2014)  . Asthma   . CHF (congestive heart failure) ([place])   . Chronic disease anemia    [name] 12/13/2014  . Chronic kidney disease (CKD), stage IV (severe) ([place])    [name] 12/13/2014... on dialysis  . Complication of anesthesia    unable to urinate after CAPD urgery  . Continuous ambulatory peritoneal dialysis status ([place])   . Coronary artery disease    [name] 12/13/2014  . Depression   . Dysrhythmia    patient unaware of irregular heartbeat  . GERD (gastroesophageal reflux disease)   . High cholesterol    [name] 12/13/2014  . Hypertension   . Non-Q wave myocardial infarction ([place])    [name] 12/13/2014  . PVD (peripheral vascular disease) ([place])    [name] 12/13/2014  . Type II diabetes mellitus ([place])     Past Surgical History:  Procedure Laterality Date  . AMPUTATION TOE Left 05/24/2019   Procedure: Left great toe amputation;  Surgeon: Caroline More, DPM;  Location: [facility];  Service: Podiatry;  Laterality: Left;  . AMPUTATION TOE Left 07/19/2019   Procedure: AMPUTATION 2nd TOE PARTIAL RAY RESECTION;  Surgeon: Sharlotte Alamo, DPM;  Location: [facility];  Service: Podiatry;  Laterality: Left;  . AV FISTULA PLACEMENT Right 04/07/2017   Procedure: ARTERIOVENOUS (AV) FISTULA CREATION ( BRACHIALCEPHALIC );  Surgeon: Katha Cabal, MD;  Location: [facility];  Service: Vascular;  Laterality: Right;  . CAPD INSERTION N/A 04/07/2017   Procedure: LAPAROSCOPIC INSERTION CONTINUOUS AMBULATORY PERITONEAL DIALYSIS  (CAPD) CATHETER;  Surgeon: Katha Cabal, MD;  Location: [facility];  Service: Vascular;  Laterality: N/A;  . CARDIAC  CATHETERIZATION  11/2014  . COLONOSCOPY WITH PROPOFOL N/A 12/01/2017   Procedure: COLONOSCOPY WITH PROPOFOL;  Surgeon: Lin Landsman, MD;  Location: [place];  Service: Endoscopy;   Laterality: N/A;  Diabetic - insulin  . COLONOSCOPY WITH PROPOFOL N/A 12/29/2017   Procedure: COLONOSCOPY WITH PROPOFOL;  Surgeon: Lin Landsman, MD;  Location: [place];  Service: Endoscopy;  Laterality: N/A;  . DIALYSIS/PERMA CATHETER INSERTION N/A 01/18/2017   Procedure: Dialysis/Perma Catheter Insertion;  Surgeon: Algernon Huxley, MD;  Location: [place] [facility];  Service: Cardiovascular;  Laterality: N/A;  . DIALYSIS/PERMA CATHETER REMOVAL N/A 07/26/2017   Procedure: DIALYSIS/PERMA CATHETER REMOVAL;  Surgeon: Algernon Huxley, MD;  Location: [place] [facility];  Service: Cardiovascular;  Laterality: N/A;  . INCISION AND DRAINAGE OF WOUND Right ~ 10/2014   "4th toe foot"  . IRRIGATION AND DEBRIDEMENT FOOT Left 07/19/2019   Procedure: IRRIGATION AND DEBRIDEMENT FOOT;  Surgeon: Sharlotte Alamo, DPM;  Location: [facility];  Service: Podiatry;  Laterality: Left;  . LOWER EXTREMITY ANGIOGRAPHY Left 05/23/2019   Procedure: Lower Extremity Angiography;  Surgeon: Katha Cabal, MD;  Location: [place] [facility];  Service: Cardiovascular;  Laterality: Left;  . LOWER EXTREMITY ANGIOGRAPHY Left 07/18/2019   Procedure: LOWER EXTREMITY ANGIOGRAPHY;  Surgeon: Katha Cabal, MD;  Location: [place] [facility];  Service: Cardiovascular;  Laterality: Left;  . PERCUTANEOUS CORONARY STENT INTERVENTION (PCI-S) N/A 12/14/2014   Procedure: PERCUTANEOUS CORONARY STENT INTERVENTION (PCI-S);  Surgeon: Charolette Forward, MD;  [facility];  Service: Cardiovascular;  Laterality: N/A;  . PERCUTANEOUS CORONARY STENT INTERVENTION (PCI-S) N/A 12/18/2014   Procedure: PERCUTANEOUS CORONARY STENT INTERVENTION (PCI-S);  Surgeon: Charolette Forward, MD;  Location: [facility];  Service: Cardiovascular;  Laterality: N/A;  . POLYPECTOMY  12/01/2017   Procedure: POLYPECTOMY;  Surgeon: Lin Landsman, MD;  Location: [place];  Service: Endoscopy;;  . POLYPECTOMY  12/29/2017   Procedure: POLYPECTOMY;   Surgeon: Lin Landsman, MD;  Location: [place];  Service: Endoscopy;;  . TOE AMPUTATION Right 2015   4th toe    SOCIAL HISTORY:  reports that he quit smoking about 4 years ago. His smoking use included cigarettes. He started smoking about 34 years ago. He has a 30.00 pack-year smoking history. He has never used smokeless tobacco. He reports previous alcohol use. He reports that he does not use drugs.  No Known Allergies  FAMILY HISTORY: Family History  Problem Relation Age of Onset  . Cancer Mother        Lung  . Cancer Father        Lung  . Diabetes Sister   . Diabetes Brother   . CAD Brother   . Hernia Son   . Cancer Brother      Prior to Admission medications   Medication Sig Start Date End Date Taking? Authorizing Provider  aspirin EC 81 MG tablet Take 1 tablet (81 mg total) by mouth daily. 07/21/19  Yes Ivor Costa, MD  atorvastatin (LIPITOR) 40 MG tablet Take 40 mg by mouth daily. 05/15/19  Yes [provider]  B Complex-C-Folic Acid (DIALYVITE 397) 0.8 MG TABS Take 1 tablet by mouth daily. 10/25/17  Yes [provider]  calcitRIOL (ROCALTROL) 0.25 MCG capsule Take 0.25 mcg by mouth daily.   Yes [provider]  calcium acetate (PHOSLO) 667 MG capsule Take 667-2,001 mg by mouth See admin instructions. Take 2,001 mg by mouth 3 times daily with meals and  196-2229 mg with snacks 06/08/17  Yes [provider]  carvedilol (COREG) 25 MG tablet Take 1 tablet (25 mg total) by mouth 2 (two) times daily with a meal. 08/30/17  Yes Darylene Price A, FNP  cefTAZidime (FORTAZ) IVPB Inject 1 g into the vein daily for 14 days. Indication:  cellulitis Last Day of Therapy:  08/07/19 Please Administer this Antibiotic Intraperitoneally 07/24/19 08/07/19 Yes Ivor Costa, MD  Cholecalciferol (VITAMIN D) 2000 units tablet Take 2,000 Units by mouth daily.    Yes [provider]  furosemide (LASIX) 80 MG tablet Take 80 mg by mouth daily. 12/01/18   Yes [provider]  hydrALAZINE (APRESOLINE) 25 MG tablet Take 1 tablet (25 mg total) by mouth 3 (three) times daily. Patient taking differently: Take 25 mg by mouth daily as needed (148 or higher).  07/21/19 10/19/19 Yes Ivor Costa, MD  insulin aspart protamine- aspart (NOVOLOG MIX 70/30) (70-30) 100 UNIT/ML injection Inject 0.25 mLs (25 Units total) into the skin 2 (two) times daily with a meal. Patient taking differently: Inject 2 Units into the skin 2 (two) times daily as needed (blood sugar of 140 or higher).  03/12/18  Yes Fritzi Mandes, MD  isosorbide mononitrate (IMDUR) 30 MG 24 hr tablet Take 1 tablet (30 mg total) by mouth daily. 06/23/19 09/21/19 Yes Agbor-Etang, Aaron Edelman, MD  nystatin (MYCOSTATIN) 100000 UNIT/ML suspension Take 5 mLs (500,000 Units total) by mouth 3 (three) times daily for 14 days. 07/24/19 08/07/19 Yes Ivor Costa, MD  omeprazole (PRILOSEC) 20 MG capsule Take 1 capsule (20 mg total) by mouth daily. 01/10/15  Yes Gladstone Lighter, MD  tamsulosin (FLOMAX) 0.4 MG CAPS capsule Take 1 capsule (0.4 mg total) by mouth daily. 06/19/19  Yes Hollice Espy, MD  vitamin C (VITAMIN C) 500 MG tablet Take 1 tablet (500 mg total) by mouth 2 (two) times daily. 07/21/19  Yes Ivor Costa, MD  albuterol (PROVENTIL HFA;VENTOLIN HFA) 108 (90 BASE) MCG/ACT inhaler Inhale 2 puffs into the lungs every 6 (six) hours as needed for wheezing or shortness of breath. 01/10/15   Gladstone Lighter, MD  clopidogrel (PLAVIX) 75 MG tablet Take 1 tablet (75 mg total) by mouth daily with breakfast. Patient not taking: Reported on 07/31/2019 05/27/19   Dustin Flock, MD  gentamicin cream (GARAMYCIN) 0.1 % Apply 1 application topically daily. 03/02/18   [provider]  mupirocin ointment (BACTROBAN) 2 % Place 1 application into the nose 2 (two) times daily. 07/21/19   Ivor Costa, MD  nitroGLYCERIN (NITROSTAT) 0.4 MG SL tablet Place 0.4 mg under the tongue every 5 (five) minutes as needed for chest  pain. Pt needs new Rx. Bottle has expried    [provider]  Nutritional Supplements (FEEDING SUPPLEMENT, NEPRO CARB STEADY,) LIQD Take 237 mLs by mouth 2 (two) times daily between meals for 15 days. Patient taking differently: Take 237 mLs by mouth 3 (three) times a week.  07/22/19 08/06/19  Ivor Costa, MD  sulfamethoxazole-trimethoprim (BACTRIM) 400-80 MG tablet Take 1 tablet by mouth daily. Patient not taking: Reported on 07/31/2019 07/26/19   Tsosie Billing, MD  traMADol (ULTRAM) 50 MG tablet Take 50 mg by mouth every 6 (six) hours as needed for moderate pain.  07/06/19   [provider]  vancomycin IVPB Inject 2,000 mg into the vein 2 (two) times a week for 4 doses. Indication:  cellulits Last Day of Therapy:  08/04/19 Please Administer Antibiotic Intraperitoneally 07/24/19 08/04/19  Ivor Costa, MD    Physical Exam: Vitals:  07/31/19 1115 07/31/19 1130 07/31/19 1145 07/31/19 1200  BP:  (!) 132/59  130/60  Pulse: 75 75 76 76  Resp:      Temp:      TempSrc:      SpO2: 93% 95% 93% 93%  Weight:      Height:          Constitutional: NAD, calm, comfortable Eyes: PERRL, lids and conjunctivae normal ENMT: Mucous membranes are moist. Posterior pharynx clear of any exudate or lesions.Normal dentition.  Neck: normal, supple, no masses, no thyromegaly Respiratory: clear to auscultation bilaterally, no wheezing, no crackles. Normal respiratory effort. No accessory muscle use.  Cardiovascular: Regular rate and rhythm, no murmurs / rubs / gallops. No extremity edema.  Diminished distal pulses Abdomen: no tenderness, no masses palpated. No hepatosplenomegaly. Bowel sounds positive.  Musculoskeletal: Left lower extremity cast in place.  Skin: Right lower extremity gangrenous toes Neurologic: CN 2-12 grossly intact. Sensation intact, DTR normal. Strength 5/5 in all 4.  Psychiatric: Normal judgment and insight. Alert and oriented x 3. Normal mood.     Labs on  Admission: I have personally reviewed following labs and imaging studies  CBC: Recent Labs  Lab 07/31/19 0817  WBC 11.7*  HGB 8.4*  HCT 26.9*  MCV 98.2  PLT 353   Basic Metabolic Panel: Recent Labs  Lab 07/31/19 0817  NA 136  K 4.6  CL 99  CO2 23  GLUCOSE 216*  BUN 60*  CREATININE 7.36*  CALCIUM 7.5*   GFR: Estimated Creatinine Clearance: 12.3 mL/min (A) (by C-G formula based on SCr of 7.36 mg/dL (H)). Liver Function Tests: Recent Labs  Lab 07/31/19 0817  AST 23  ALT 19  ALKPHOS 110  BILITOT 0.8  PROT 6.4*  ALBUMIN 2.6*   No results for input(s): LIPASE, AMYLASE in the last 168 hours. No results for input(s): AMMONIA in the last 168 hours. Coagulation Profile: Recent Labs  Lab 07/31/19 0817  INR 1.1   Cardiac Enzymes: No results for input(s): CKTOTAL, CKMB, CKMBINDEX, TROPONINI in the last 168 hours. BNP (last 3 results) No results for input(s): PROBNP in the last 8760 hours. HbA1C: No results for input(s): HGBA1C in the last 72 hours. CBG: No results for input(s): GLUCAP in the last 168 hours. Lipid Profile: No results for input(s): CHOL, HDL, LDLCALC, TRIG, CHOLHDL, LDLDIRECT in the last 72 hours. Thyroid Function Tests: No results for input(s): TSH, T4TOTAL, FREET4, T3FREE, THYROIDAB in the last 72 hours. Anemia Panel: No results for input(s): VITAMINB12, FOLATE, FERRITIN, TIBC, IRON, RETICCTPCT in the last 72 hours. Urine analysis:    Component Value Date/Time   COLORURINE YELLOW 06/19/2019 1202   APPEARANCEUR CLEAR 06/19/2019 1202   APPEARANCEUR Clear 12/03/2014 0751   LABSPEC 1.020 06/19/2019 1202   LABSPEC 1.011 12/03/2014 0751   PHURINE 6.0 06/19/2019 1202   GLUCOSEU 100 (A) 06/19/2019 1202   GLUCOSEU 50 mg/dL 12/03/2014 0751   HGBUR MODERATE (A) 06/19/2019 1202   BILIRUBINUR NEGATIVE 06/19/2019 1202   BILIRUBINUR Negative 12/03/2014 0751   KETONESUR NEGATIVE 06/19/2019 1202   PROTEINUR 100 (A) 06/19/2019 1202   NITRITE NEGATIVE  06/19/2019 1202   LEUKOCYTESUR NEGATIVE 06/19/2019 1202   LEUKOCYTESUR Negative 12/03/2014 0751   Sepsis Labs: !!!!!!!!!!!!!!!!!!!!!!!!!!!!!!!!!!!!!!!!!!!! @LABRCNTIP (procalcitonin:4,lacticidven:4) ) Recent Results (from the past 240 hour(s))  SARS CORONAVIRUS 2 (TAT 6-24 HRS) Nasopharyngeal Nasopharyngeal Swab     Status: None   Collection Time: 07/28/19 11:24 AM   Specimen: Nasopharyngeal Swab  Result Value Ref Range Status  SARS Coronavirus 2 NEGATIVE NEGATIVE Final    Comment: (NOTE) SARS-CoV-2 target nucleic acids are NOT DETECTED. The SARS-CoV-2 RNA is generally detectable in upper and lower respiratory specimens during the acute phase of infection. Negative results do not preclude SARS-CoV-2 infection, do not rule out co-infections with other pathogens, and should not be used as the sole basis for treatment or other patient management decisions. Negative results must be combined with clinical observations, patient history, and epidemiological information. The expected result is Negative. Fact Sheet for Patients: SugarRoll.be Fact Sheet for Healthcare Providers: https://www.woods-mathews.com/ This test is not yet approved or cleared by the [place] FDA and  has been authorized for detection and/or diagnosis of SARS-CoV-2 by FDA under an Emergency Use Authorization (EUA). This EUA will remain  in effect (meaning this test can be used) for the duration of the COVID-19 declaration under Section 56 4(b)(1) of the Act, 21 U.S.C. section 360bbb-3(b)(1), unless the authorization is terminated or revoked sooner. Performed at [facility], [place] [address], [place], [place] [address]      Radiological Exams on Admission: No results found.   All images have been reviewed by me personally.    Assessment/Plan Principal Problem:   Leg pain, right Active Problems:   Type 2 diabetes mellitus with other diabetic kidney  complication (HCC)   GERD (gastroesophageal reflux disease)   ESRD (end stage renal disease) (HCC)   Tobacco abuse   Anemia in ESRD (end-stage renal disease) (HCC)   Gastro-esophageal reflux disease with esophagitis   Type II diabetes mellitus (HCC)   PVD (peripheral vascular disease) (HCC)   PAD (peripheral artery disease) (HCC)   Osteomyelitis of second toe of left foot (HCC)    Right lower extremity toe gangrene Peripheral arterial disease, severe Recent left toe osteomyelitis -Admit the patient to the hospital.  Resume outpatient antibiotics Bactrim, vancomycin and ceftazidime as prescribed during previous admission -Vascular surgery consulted, heparin drip.  Recent left lower extremity recanalization on 11/17 by vascular.  Incision and drainage of the left first metatarsal and amputation of left second toe with partial ray resection by podiatry on 11/18 -Plans for right lower extremity vascular intervention planned tomorrow -Supportive care, pain control.  Bowel regimen -On Plavix  ESRD on peritoneal dialysis -Nephrology team consulted  Diabetes mellitus type 2 -Unable to confirm his home insulin 70/30.  Spoke with pharmacy.  Hemoglobin A1c a week ago was 6.0 -For now we will use insulin sliding scale and Accu-Chek  Coronary artery disease Chronic systolic congestive heart failure, ejection fraction 35-40%, class I.  Euvolemic. -Continue home medications.  Currently chest pain-free. -Echocardiogram 08/2018-ejection fraction 35-40% with hypokinesia.  GERD -PPI  DVT prophylaxis: Heparin drip Code Status: Full code Family Communication: None Disposition Plan: To be determined Consults called: Vascular surgery, nephrology Admission status: Admit inpatient for right lower extremity gangrenous toe for vascular intervention.   Time Spent: 65 minutes.  >50% of the time was devoted to discussing the patients care, assessment, plan and disposition with other care givers along  with counseling the patient about the risks and benefits of treatment.     Arsenio Loader MD [facility]  If 7PM-7AM, please contact night-coverage   07/31/2019, 12:20 PM

## 2019-07-31 NOTE — ED Notes (Signed)
Pt suddenly nauseous and actively vomiting.

## 2019-07-31 NOTE — ED Notes (Signed)
Calcium acetate dose received from pharm. This RN to pt's bedside. Pt asleep. Easily woken. Pt states nausea much better but refuses calcium acetate stating he still thinks he would "throw it back up."

## 2019-07-31 NOTE — Consult Note (Signed)
NAME: Scott [name]  DOB: 10-13-1960  MRN: [id number]  Date/Time: 07/31/2019 9:11 PM  REQUESTING PROVIDER: Dr.Amin Subjective:  REASON FOR CONSULT: Foot infection ? Scott [name] is a 58 y.o. male with a history of peripheral arterial disease, end-stage renal disease, on peritoneal dialysis was recently in the hospital between 07/17/2019 and 07/21/2019 for gangrene of the left great toe and underwent debridement of the first metatarsal and second ray excision on 07/19/2019.  Patient was discharged home on vancomycin and ceftazidime to be given through the PD catheter.  Surgical cultures came back positive and it had achromobacter species ,  he was also started on single strength tablet sulfamethoxazole trimethoprim last week.  During that hospitalization it was noted that he had gangrene of the right great toe and was scheduled to undergo an angiogram tomorrow. Patient presents to the hospital today with increasing pain in the right great toe and foot. He denies any fever He has not had any nausea vomiting, diarrhea or abdominal pain or cough or shortness of breath.  He has been taking his antibiotics regularly.  Patient has has had a chronic problem with his left foot secondary to peripheral vascular disease. Between 01/21/2019 till 03/04/2019 he was treated  with IV antibiotics- vancomycin, cefepime, metronidazole -for early acute osteomyelitis at [facility].  He was followed by Dr. Vista Deck at [facility].  He then had left lower extremity balloon angioplasty and stenting of the left SFA/popliteal on 03/08/2019.  On 04/14/2019 he presented to [facility] ED with worsening left toe pain and concern for worsening osteomyelitis and gangrene in his left great toe.  A left lower extremity duplex showed severely dampened left great toe flow compared to previous studies consistent with SFA stent occlusion.  He was taken to the OR for a left lower extremity arteriogram with balloon angioplasty.  On postop day 1 he got left lower  extremity PVLs with findings of patent outflow stent and no flow in the left tibioperoneal vessels and left great toe pressure of 0 mmHg.  Podiatry was consulted and recommended outpatient amputation for his left great toe wound and hyperbaric oxygen therapy. Patient came [facility] ED on 05/22/2019 complaining of gangrenous left toe and also the right great toe becoming gangrenous.  On 05/23/2019 he underwent angio and had atherectomy of the left anterior tibial artery, angioplasty of the left anterior tibial and dorsalis pedis, atherectomy of the left tibioperoneal  trunk by Dr. Franchot Gallo.  Then on 05/24/2019 [name] the podiatrist took him for left partial first ray amputation.  The wound culture had multiple organisms including Citrobacter, Providencia , Pseudomonas.  After receiving IV antibiotics in the hospital he was discharged home on 05/26/2019 on Augmentin.  He was then followed by both the podiatrist and the wound clinic.  He had another culture sent from the wound on 06/19/2019 and that was Pseudomonas and Citrobacter.  He was prescribed ciprofloxacin in the wound clinic for 7 days.  As the tissue on the left great toe amputation site was not healing he came back to the ED on 07/17/2019.  He has been started on vancomycin and ceftriaxone.  He underwent angio on 07/18/2019 by Dr. Franchot Gallo and it was found that there was a foreign body with a portion of the CX 1 catheter retained from previous angiogram and this was removed.  He also had a stent placement in the left anterior tibial artery.  He underwent thromboembolectomy of  left anterior tibial artery. On 07/19/2019 he was taken  for surgery by podiatrist and underwent incision with debridement of the infected bone and soft tissue of the left first metatarsal.  And amputation of left second toe with partial ray resectio  Past Medical History:  Diagnosis Date  . Acute respiratory failure with hypoxia ([place]) 09/17/2018  . Arthritis    "left arm; right  leg" (12/13/2014)  . Asthma   . CHF (congestive heart failure) ([place])   . Chronic disease anemia    [name] 12/13/2014  . Chronic kidney disease (CKD), stage IV (severe) ([place])    [name] 12/13/2014... on dialysis  . Complication of anesthesia    unable to urinate after CAPD urgery  . Continuous ambulatory peritoneal dialysis status ([place])   . Coronary artery disease    [name] 12/13/2014  . Depression   . Dysrhythmia    patient unaware of irregular heartbeat  . GERD (gastroesophageal reflux disease)   . High cholesterol    [name] 12/13/2014  . Hypertension   . Non-Q wave myocardial infarction ([place])    [name] 12/13/2014  . PVD (peripheral vascular disease) ([place])    [name] 12/13/2014  . Type II diabetes mellitus ([place])     Past Surgical History:  Procedure Laterality Date  . AMPUTATION TOE Left 05/24/2019   Procedure: Left great toe amputation;  Surgeon: Caroline More, DPM;  Location: [facility] ORS;  Service: Podiatry;  Laterality: Left;  . AMPUTATION TOE Left 07/19/2019   Procedure: AMPUTATION 2nd TOE PARTIAL RAY RESECTION;  Surgeon: Sharlotte Alamo, DPM;  Location: [facility] ORS;  Service: Podiatry;  Laterality: Left;  . AV FISTULA PLACEMENT Right 04/07/2017   Procedure: ARTERIOVENOUS (AV) FISTULA CREATION ( BRACHIALCEPHALIC );  Surgeon: Katha Cabal, MD;  Location: [facility] ORS;  Service: Vascular;  Laterality: Right;  . CAPD INSERTION N/A 04/07/2017   Procedure: LAPAROSCOPIC INSERTION CONTINUOUS AMBULATORY PERITONEAL DIALYSIS  (CAPD) CATHETER;  Surgeon: Katha Cabal, MD;  Location: [facility] ORS;  Service: Vascular;  Laterality: N/A;  . CARDIAC CATHETERIZATION  11/2014  . COLONOSCOPY WITH PROPOFOL N/A 12/01/2017   Procedure: COLONOSCOPY WITH PROPOFOL;  Surgeon: Lin Landsman, MD;  Location: [place];  Service: Endoscopy;  Laterality: N/A;  Diabetic - insulin  . COLONOSCOPY WITH PROPOFOL N/A 12/29/2017   Procedure: COLONOSCOPY WITH PROPOFOL;  Surgeon: Lin Landsman, MD;  Location: [place];  Service: Endoscopy;  Laterality: N/A;  . DIALYSIS/PERMA CATHETER INSERTION N/A 01/18/2017   Procedure: Dialysis/Perma Catheter Insertion;  Surgeon: Algernon Huxley, MD;  Location: [place] CV LAB;  Service: Cardiovascular;  Laterality: N/A;  . DIALYSIS/PERMA CATHETER REMOVAL N/A 07/26/2017   Procedure: DIALYSIS/PERMA CATHETER REMOVAL;  Surgeon: Algernon Huxley, MD;  Location: [place] CV LAB;  Service: Cardiovascular;  Laterality: N/A;  . INCISION AND DRAINAGE OF WOUND Right ~ 10/2014   "4th toe foot"  . IRRIGATION AND DEBRIDEMENT FOOT Left 07/19/2019   Procedure: IRRIGATION AND DEBRIDEMENT FOOT;  Surgeon: Sharlotte Alamo, DPM;  Location: [facility] ORS;  Service: Podiatry;  Laterality: Left;  . LOWER EXTREMITY ANGIOGRAPHY Left 05/23/2019   Procedure: Lower Extremity Angiography;  Surgeon: Katha Cabal, MD;  Location: [place] CV LAB;  Service: Cardiovascular;  Laterality: Left;  . LOWER EXTREMITY ANGIOGRAPHY Left 07/18/2019   Procedure: LOWER EXTREMITY ANGIOGRAPHY;  Surgeon: Katha Cabal, MD;  Location: [place] CV LAB;  Service: Cardiovascular;  Laterality: Left;  . PERCUTANEOUS CORONARY STENT INTERVENTION (PCI-S) N/A 12/14/2014   Procedure: PERCUTANEOUS CORONARY STENT INTERVENTION (PCI-S);  Surgeon: Charolette Forward, MD;  Location: [facility] CATH  LAB;  Service: Cardiovascular;  Laterality: N/A;  . PERCUTANEOUS CORONARY STENT INTERVENTION (PCI-S) N/A 12/18/2014   Procedure: PERCUTANEOUS CORONARY STENT INTERVENTION (PCI-S);  Surgeon: Charolette Forward, MD;  Location: [facility] CATH LAB;  Service: Cardiovascular;  Laterality: N/A;  . POLYPECTOMY  12/01/2017   Procedure: POLYPECTOMY;  Surgeon: Lin Landsman, MD;  Location: [place];  Service: Endoscopy;;  . POLYPECTOMY  12/29/2017   Procedure: POLYPECTOMY;  Surgeon: Lin Landsman, MD;  Location: [place];  Service: Endoscopy;;  . TOE AMPUTATION Right 2015   4th toe    Social History   Socioeconomic History   . Marital status: Significant Other    Spouse name: Not on file  . Number of children: Not on file  . Years of education: Not on file  . Highest education level: Not on file  Occupational History  . Not on file  Social Needs  . Financial resource strain: Not on file  . Food insecurity    Worry: Not on file    Inability: Not on file  . Transportation needs    Medical: Not on file    Non-medical: Not on file  Tobacco Use  . Smoking status: Former Smoker    Packs/day: 1.00    Years: 30.00    Pack years: 30.00    Types: Cigarettes    Start date: 08/31/1984    Quit date: 11/30/2014    Years since quitting: 4.6  . Smokeless tobacco: Never Used  Substance and Sexual Activity  . Alcohol use: Not Currently  . Drug use: No  . Sexual activity: Not Currently  Lifestyle  . Physical activity    Days per week: Not on file    Minutes per session: Not on file  . Stress: Not on file  Relationships  . Social Herbalist on phone: Not on file    Gets together: Not on file    Attends religious service: Not on file    Active member of club or organization: Not on file    Attends meetings of clubs or organizations: Not on file    Relationship status: Not on file  . Intimate partner violence    Fear of current or ex partner: Not on file    Emotionally abused: Not on file    Physically abused: Not on file    Forced sexual activity: Not on file  Other Topics Concern  . Not on file  Social History Narrative   Lives at home with girlfriend and son    Family History  Problem Relation Age of Onset  . Cancer Mother        Lung  . Cancer Father        Lung  . Diabetes Sister   . Diabetes Brother   . CAD Brother   . Hernia Son   . Cancer Brother    No Known Allergies ID  Recent  Procedure Surgery Injections Trauma Sick contacts Travel Antibiotic use Food- raw/exotic Steroid/immune suppressants/splenectomy/Hardware Animal bites Tick exposure Water  sports Fishing/hunting/animal bird exposure ? Current Facility-Administered Medications  Medication Dose Route Frequency Provider Last Rate Last Dose  . acetaminophen (TYLENOL) tablet 650 mg  650 mg Oral Q6H PRN Amin, Ankit Chirag, MD       Or  . acetaminophen (TYLENOL) suppository 650 mg  650 mg Rectal Q6H PRN Amin, Ankit Chirag, MD      . albuterol (PROVENTIL) (2.5 MG/3ML) 0.083% nebulizer solution 3 mL  3 mL Inhalation  Q6H PRN Amin, Jeanella Flattery, MD      . aspirin EC tablet 81 mg  81 mg Oral Daily Amin, Ankit Chirag, MD   81 mg at 07/31/19 1450  . atorvastatin (LIPITOR) tablet 40 mg  40 mg Oral Daily Amin, Ankit Chirag, MD   40 mg at 07/31/19 1451  . bisacodyl (DULCOLAX) EC tablet 5 mg  5 mg Oral Daily PRN Amin, Ankit Chirag, MD      . calcitRIOL (ROCALTROL) capsule 0.25 mcg  0.25 mcg Oral Daily Amin, Ankit Chirag, MD   0.25 mcg at 07/31/19 1435  . calcium acetate (PHOSLO) capsule 2,001 mg  2,001 mg Oral TID WC Lu Duffel, [place]   2,001 mg at 07/31/19 1437  . calcium acetate (PHOSLO) capsule 035-0,093 mg  818-2,993 mg Oral BID PRN Lu Duffel, RPH      . carvedilol (COREG) tablet 25 mg  25 mg Oral BID WC Amin, Ankit Chirag, MD   25 mg at 07/31/19 1749  . cefTAZidime (FORTAZ) 1 g in sodium chloride 0.9 % 100 mL IVPB  1 g Intravenous Q24H Damita Lack, MD   Stopped at 07/31/19 1633  . [START ON 08/01/2019] clopidogrel (PLAVIX) tablet 75 mg  75 mg Oral Q breakfast Amin, Ankit Chirag, MD      . heparin ADULT infusion 100 units/mL (25000 units/257mL sodium chloride 0.45%)  1,450 Units/hr Intravenous Continuous Lu Duffel, RPH 14.5 mL/hr at 07/31/19 1140 1,450 Units/hr at 07/31/19 1140  . hydrALAZINE (APRESOLINE) tablet 25 mg  25 mg Oral TID Damita Lack, MD   Stopped at 07/31/19 1751  . HYDROcodone-acetaminophen (NORCO/VICODIN) 5-325 MG per tablet 1-2 tablet  1-2 tablet Oral Q4H PRN Amin, Ankit Chirag, MD      . HYDROmorphone (DILAUDID) injection 1 mg  1 mg  Intravenous Once Carrie Mew, MD      . insulin aspart (novoLOG) injection 0-5 Units  0-5 Units Subcutaneous QHS Amin, Ankit Chirag, MD      . insulin aspart (novoLOG) injection 0-9 Units  0-9 Units Subcutaneous TID WC Amin, Jeanella Flattery, MD   1 Units at 07/31/19 1748  . insulin aspart (novoLOG) injection 2 Units  2 Units Subcutaneous TID WC Amin, Jeanella Flattery, MD   2 Units at 07/31/19 1748  . isosorbide mononitrate (IMDUR) 24 hr tablet 30 mg  30 mg Oral Daily Amin, Ankit Chirag, MD      . ondansetron (ZOFRAN) tablet 4 mg  4 mg Oral Q6H PRN Amin, Ankit Chirag, MD       Or  . ondansetron (ZOFRAN) injection 4 mg  4 mg Intravenous Q6H PRN Amin, Ankit Chirag, MD   4 mg at 07/31/19 1715  . oxyCODONE-acetaminophen (PERCOCET/ROXICET) 5-325 MG per tablet 1 tablet  1 tablet Oral Q30 min PRN Vanessa Ivanhoe, MD   1 tablet at 07/31/19 0810  . pantoprazole (PROTONIX) EC tablet 40 mg  40 mg Oral Daily Amin, Ankit Chirag, MD   40 mg at 07/31/19 1440  . senna-docusate (Senokot-S) tablet 1 tablet  1 tablet Oral QHS PRN Amin, Ankit Chirag, MD      . sulfamethoxazole-trimethoprim (BACTRIM) 400-80 MG per tablet 1 tablet  1 tablet Oral Daily Amin, Ankit Chirag, MD   1 tablet at 07/31/19 1440  . tamsulosin (FLOMAX) capsule 0.4 mg  0.4 mg Oral Daily Amin, Ankit Chirag, MD   0.4 mg at 07/31/19 1450  . traMADol (ULTRAM) tablet 50 mg  50 mg Oral Q6H PRN Amin,  Ankit Chirag, MD      . vancomycin variable dose per unstable renal function (pharmacist dosing)   Does not apply See admin instructions Shanlever, Pierce Crane, RPH       Facility-Administered Medications Ordered in Other Encounters  Medication Dose Route Frequency Provider Last Rate Last Dose  . ceFAZolin (ANCEF) IVPB 2g/100 mL premix  2 g Intravenous 30 min Pre-Op Algernon Huxley, MD         Abtx:  Anti-infectives (From admission, onward)   Start     Dose/Rate Route Frequency Ordered Stop   07/31/19 1600  cefTAZidime (FORTAZ) 1 g in sodium chloride 0.9 % 100 mL  IVPB     1 g 200 mL/hr over 30 Minutes Intravenous Every 24 hours 07/31/19 1520     07/31/19 1530  vancomycin variable dose per unstable renal function (pharmacist dosing)      Does not apply See admin instructions 07/31/19 1530     07/31/19 1215  sulfamethoxazole-trimethoprim (BACTRIM) 400-80 MG per tablet 1 tablet     1 tablet Oral Daily 07/31/19 1021     07/31/19 1030  cefTAZidime (FORTAZ) IVPB  Status:  Discontinued    Note to Pharmacy: Indication:  cellulitis Last Day of Therapy:  08/07/19 Please Administer this Antibiotic Intraperitoneally     1 g Intravenous Every 24 hours 07/31/19 1021 07/31/19 1514   07/31/19 1030  vancomycin IVPB  Status:  Discontinued    Note to Pharmacy: Indication:  cellulits Last Day of Therapy:  08/04/19 Please Administer Antibiotic Intraperitoneally     2,000 mg Intravenous 2 times weekly 07/31/19 1021 07/31/19 1529      REVIEW OF SYSTEMS:  Const: negative fever, negative chills, negative weight loss Eyes: negative diplopia or visual changes, negative eye pain ENT: negative coryza, negative sore throat Resp: negative cough, hemoptysis, dyspnea Cards: negative for chest pain, palpitations, lower extremity edema GU: negative for frequency, dysuria and hematuria GI: Negative for abdominal pain, diarrhea, bleeding, constipation Skin: negative for rash and pruritus Heme: negative for easy bruising and gum/nose bleeding MS: negative for myalgias, arthralgias, back pain and muscle weakness Neurolo:negative for headaches, dizziness, vertigo, memory problems  Psych: negative for feelings of anxiety, depression  Endocrine: Has diabetes. Allergy/Immunology- negative for any medication or food allergies ?  Objective:  VITALS:  BP (!) 91/26 (BP Location: Left Arm)   Pulse 73   Temp 97.7 F (36.5 C) (Oral)   Resp 18   Ht 5\' 9"  (1.753 m)   Wt 98 kg   SpO2 94%   BMI 31.90 kg/m  PHYSICAL EXAM:  General: Alert, cooperative, no distress, appears stated  age.  Head: Normocephalic, without obvious abnormality, atraumatic. Eyes: Conjunctivae clear, anicteric sclerae. Pupils are equal ENT Nares normal. No drainage or sinus tenderness. Lips, mucosa, and tongue normal. No Thrush Neck: Supple, symmetrical, no adenopathy, thyroid: non tender no carotid bruit and no JVD. Back: Did not examine Lungs: Bilateral air entry Heart: S1-S2 Abdomen: Soft, PD catheter in place Extremities: Left foot dressing removed Surgical site approximated well, no erythema, some moisture     Right great toe worsening gangrene Some erythema on the dorsum of the foot on the shin       Skin: No rashes or lesions. Or bruising Lymph: Cervical, supraclavicular normal. Neurologic: Grossly non-focal Pertinent Labs Lab Results CBC    Component Value Date/Time   WBC 11.7 (H) 07/31/2019 0817   RBC 2.74 (L) 07/31/2019 0817   HGB 8.4 (L) 07/31/2019 3474  HGB 10.7 (L) 12/13/2014 0102   HCT 26.9 (L) 07/31/2019 0817   HCT 33.8 (L) 12/13/2014 0102   PLT 266 07/31/2019 0817   PLT 510 (H) 12/13/2014 0102   MCV 98.2 07/31/2019 0817   MCV 90 12/13/2014 0102   MCH 30.7 07/31/2019 0817   MCHC 31.2 07/31/2019 0817   RDW 16.1 (H) 07/31/2019 0817   RDW 15.5 (H) 12/13/2014 0102   LYMPHSABS 1.0 07/17/2019 1359   LYMPHSABS 2.3 12/04/2014 0355   MONOABS 0.6 07/17/2019 1359   MONOABS 0.8 12/04/2014 0355   EOSABS 0.3 07/17/2019 1359   EOSABS 0.5 12/04/2014 0355   BASOSABS 0.1 07/17/2019 1359   BASOSABS 0.1 12/04/2014 0355    CMP Latest Ref Rng & Units 07/31/2019 07/21/2019 07/20/2019  Glucose 70 - 99 mg/dL 216(H) 190(H) 204(H)  BUN 6 - 20 mg/dL 60(H) 49(H) 47(H)  Creatinine 0.61 - 1.24 mg/dL 7.36(H) 6.89(H) 7.17(H)  Sodium 135 - 145 mmol/L 136 136 135  Potassium 3.5 - 5.1 mmol/L 4.6 4.0 4.4  Chloride 98 - 111 mmol/L 99 98 99  CO2 22 - 32 mmol/L 23 24 23   Calcium 8.9 - 10.3 mg/dL 7.5(L) 7.3(L) 7.1(L)  Total Protein 6.5 - 8.1 g/dL 6.4(L) - -  Total Bilirubin 0.3  - 1.2 mg/dL 0.8 - -  Alkaline Phos 38 - 126 U/L 110 - -  AST 15 - 41 U/L 23 - -  ALT 0 - 44 U/L 19 - -      Microbiology: Recent Results (from the past 240 hour(s))  SARS CORONAVIRUS 2 (TAT 6-24 HRS) Nasopharyngeal Nasopharyngeal Swab     Status: None   Collection Time: 07/28/19 11:24 AM   Specimen: Nasopharyngeal Swab  Result Value Ref Range Status   SARS Coronavirus 2 NEGATIVE NEGATIVE Final    Comment: (NOTE) SARS-CoV-2 target nucleic acids are NOT DETECTED. The SARS-CoV-2 RNA is generally detectable in upper and lower respiratory specimens during the acute phase of infection. Negative results do not preclude SARS-CoV-2 infection, do not rule out co-infections with other pathogens, and should not be used as the sole basis for treatment or other patient management decisions. Negative results must be combined with clinical observations, patient history, and epidemiological information. The expected result is Negative. Fact Sheet for Patients: SugarRoll.be Fact Sheet for Healthcare Providers: https://www.woods-mathews.com/ This test is not yet approved or cleared by the [place] FDA and  has been authorized for detection and/or diagnosis of SARS-CoV-2 by FDA under an Emergency Use Authorization (EUA). This EUA will remain  in effect (meaning this test can be used) for the duration of the COVID-19 declaration under Section 56 4(b)(1) of the Act, 21 U.S.C. section 360bbb-3(b)(1), unless the authorization is terminated or revoked sooner. Performed at [facility], [place] [address] [place], [place] [address]     IMAGING RESULTS: No studies  ? Impression/Recommendation ?Peripheral vascular disease with gangrene of the right great toe which is worsening. He very likely will need amputation of the great toe.  Awaiting vascular intervention  History of left toe gangrene.  Status post angioplasty with stents partial ray great  toe amputation in September 2020 followed by nonhealing wound and further debridement of the metatarsal and second ray excision on 07/19/2019.  Currently on vancomycin and ceftazidime and p.o. Bactrim.  As there was osteomyelitis the plan is to treat him with at least 4 weeks of antibiotics. ? Surgical culture from 07/19/2019 had Achromobacter  species and staph epidermidis. The previous superficial cultures had Pseudomonas  and Citrobacter ? __End-stage renal disease on peritoneal dialysis  Diabetes mellitus at home he gets insulin  Coronary artery disease status post stent  Hyperlipidemia atorvastatin _________________________________________________ Discussed with patient, and nephrologist. Note:  This document was prepared using Dragon voice recognition software and may include unintentional dictation errors.

## 2019-07-31 NOTE — Consult Note (Signed)
ANTICOAGULATION CONSULT NOTE - Initial Consult  Pharmacy Consult for Heparin Drip Indication: Ischemic Leg  No Known Allergies  Patient Measurements: Height: 5\' 9"  (175.3 cm) Weight: 216 lb (98 kg) IBW/kg (Calculated) : 70.7 Heparin Dosing Weight: 91.3 kg  Vital Signs: Temp: 97.7 F (36.5 C) (11/30 1400) Temp Source: Oral (11/30 1400) BP: 99/30 (11/30 1400) Pulse Rate: 78 (11/30 1400)  Labs: Recent Labs    07/31/19 0817 07/31/19 1540  HGB 8.4*  --   HCT 26.9*  --   PLT 266  --   APTT 37*  --   LABPROT 14.1  --   INR 1.1  --   HEPARINUNFRC  --  0.33  CREATININE 7.36*  --     Estimated Creatinine Clearance: 12.3 mL/min (A) (by C-G formula based on SCr of 7.36 mg/dL (H)).   Medical History: Past Medical History:  Diagnosis Date  . Acute respiratory failure with hypoxia ([place]) 09/17/2018  . Arthritis    "left arm; right leg" (12/13/2014)  . Asthma   . CHF (congestive heart failure) ([place])   . Chronic disease anemia    [name] 12/13/2014  . Chronic kidney disease (CKD), stage IV (severe) ([place])    [name] 12/13/2014... on dialysis  . Complication of anesthesia    unable to urinate after CAPD urgery  . Continuous ambulatory peritoneal dialysis status ([place])   . Coronary artery disease    [name] 12/13/2014  . Depression   . Dysrhythmia    patient unaware of irregular heartbeat  . GERD (gastroesophageal reflux disease)   . High cholesterol    [name] 12/13/2014  . Hypertension   . Non-Q wave myocardial infarction ([place])    [name] 12/13/2014  . PVD (peripheral vascular disease) ([place])    [name] 12/13/2014  . Type II diabetes mellitus ([facility])     Medications:  No PTA anticoagulant meds per record  Assessment: Scott [name] is a 58 y.o. male with a history of CHF, CKD, GERD, hypertension, PAD who comes the ED complaining of worsening of his chronic right leg pain which is below the knee and all the way down to the foot.  Over the last 2 days it has rapidly worsened and  is now intractable, unbearable.  Pharmacy has been consulted to initiate/monitor a full-dose heparin drip for ischemic leg.  Pt received 5000 unit bolus followed by 1540 units/hr.  11/30 1540 HL 0.33   Goal of Therapy:  Heparin level 0.3-0.7 units/ml Monitor platelets by anticoagulation protocol: Yes   Plan:  Heparin level is therapeutic. Will continue current rate of 1450 units/hr and order a heparin level in 8 hours. Daily CBC's while on heparin drip.  Oswald Hillock, PharmD, BCPS Clinical Pharmacist 07/31/2019 4:10 PM

## 2019-08-01 ENCOUNTER — Other Ambulatory Visit (INDEPENDENT_AMBULATORY_CARE_PROVIDER_SITE_OTHER): Payer: Self-pay | Admitting: Nurse Practitioner

## 2019-08-01 ENCOUNTER — Inpatient Hospital Stay: Payer: Commercial Managed Care - HMO | Admitting: Infectious Diseases

## 2019-08-01 ENCOUNTER — Ambulatory Visit
Admission: RE | Admit: 2019-08-01 | Payer: Commercial Managed Care - HMO | Source: Home / Self Care | Admitting: Vascular Surgery

## 2019-08-01 ENCOUNTER — Encounter
Admission: EM | Disposition: A | Discharge status: Home / Self Care | Payer: Self-pay | Source: Home / Self Care | Attending: Internal Medicine

## 2019-08-01 ENCOUNTER — Encounter: Payer: Self-pay | Admitting: Internal Medicine

## 2019-08-01 DIAGNOSIS — I739 Peripheral vascular disease, unspecified: Secondary | ICD-10-CM

## 2019-08-01 DIAGNOSIS — E1129 Type 2 diabetes mellitus with other diabetic kidney complication: Secondary | ICD-10-CM

## 2019-08-01 DIAGNOSIS — M869 Osteomyelitis, unspecified: Secondary | ICD-10-CM

## 2019-08-01 DIAGNOSIS — D631 Anemia in chronic kidney disease: Secondary | ICD-10-CM

## 2019-08-01 DIAGNOSIS — I70261 Atherosclerosis of native arteries of extremities with gangrene, right leg: Secondary | ICD-10-CM

## 2019-08-01 HISTORY — PX: LOWER EXTREMITY ANGIOGRAPHY: CATH118251

## 2019-08-01 LAB — CBC
HCT: 23.1 % — ABNORMAL LOW (ref 39.0–52.0)
Hemoglobin: 7.1 g/dL — ABNORMAL LOW (ref 13.0–17.0)
MCH: 30.1 pg (ref 26.0–34.0)
MCHC: 30.7 g/dL (ref 30.0–36.0)
MCV: 97.9 fL (ref 80.0–100.0)
Platelets: 221 10*3/uL (ref 150–400)
RBC: 2.36 MIL/uL — ABNORMAL LOW (ref 4.22–5.81)
RDW: 16.1 % — ABNORMAL HIGH (ref 11.5–15.5)
WBC: 9.6 10*3/uL (ref 4.0–10.5)
nRBC: 0 % (ref 0.0–0.2)

## 2019-08-01 LAB — APTT: aPTT: 99 seconds — ABNORMAL HIGH (ref 24–36)

## 2019-08-01 LAB — COMPREHENSIVE METABOLIC PANEL
ALT: 14 U/L (ref 0–44)
AST: 14 U/L — ABNORMAL LOW (ref 15–41)
Albumin: 2.2 g/dL — ABNORMAL LOW (ref 3.5–5.0)
Alkaline Phosphatase: 91 U/L (ref 38–126)
Anion gap: 10 (ref 5–15)
BUN: 59 mg/dL — ABNORMAL HIGH (ref 6–20)
CO2: 25 mmol/L (ref 22–32)
Calcium: 7.8 mg/dL — ABNORMAL LOW (ref 8.9–10.3)
Chloride: 103 mmol/L (ref 98–111)
Creatinine, Ser: 7.21 mg/dL — ABNORMAL HIGH (ref 0.61–1.24)
GFR calc Af Amer: 9 mL/min — ABNORMAL LOW (ref >60)
GFR calc non Af Amer: 7 mL/min — ABNORMAL LOW (ref >60)
Glucose, Bld: 128 mg/dL — ABNORMAL HIGH (ref 70–99)
Potassium: 4.4 mmol/L (ref 3.5–5.1)
Sodium: 138 mmol/L (ref 135–145)
Total Bilirubin: 0.7 mg/dL (ref 0.3–1.2)
Total Protein: 5.5 g/dL — ABNORMAL LOW (ref 6.5–8.1)

## 2019-08-01 LAB — HEPARIN LEVEL (UNFRACTIONATED)
Heparin Unfractionated: 0.24 IU/mL — ABNORMAL LOW (ref 0.30–0.70)
Heparin Unfractionated: 0.51 IU/mL (ref 0.30–0.70)
Heparin Unfractionated: 0.87 IU/mL — ABNORMAL HIGH (ref 0.30–0.70)

## 2019-08-01 LAB — GLUCOSE, CAPILLARY
Glucose-Capillary: 96 mg/dL (ref 70–99)
Glucose-Capillary: 87 mg/dL (ref 70–99)
Glucose-Capillary: 65 mg/dL — ABNORMAL LOW (ref 70–99)
Glucose-Capillary: 154 mg/dL — ABNORMAL HIGH (ref 70–99)
Glucose-Capillary: 112 mg/dL — ABNORMAL HIGH (ref 70–99)
Glucose-Capillary: 122 mg/dL — ABNORMAL HIGH (ref 70–99)
Glucose-Capillary: 151 mg/dL — ABNORMAL HIGH (ref 70–99)

## 2019-08-01 LAB — HEMOGLOBIN: Hemoglobin: 7.4 g/dL — ABNORMAL LOW (ref 13.0–17.0)

## 2019-08-01 LAB — PROTIME-INR
INR: 1.3 — ABNORMAL HIGH (ref 0.8–1.2)
Prothrombin Time: 15.6 seconds — ABNORMAL HIGH (ref 11.4–15.2)

## 2019-08-01 SURGERY — LOWER EXTREMITY ANGIOGRAPHY
Anesthesia: Moderate Sedation | Laterality: Right

## 2019-08-01 MED ORDER — HYDROCODONE-ACETAMINOPHEN 5-325 MG PO TABS
ORAL_TABLET | ORAL | Status: AC
  Administered 2019-08-01: 2 via ORAL
  Filled 2019-08-01: qty 2

## 2019-08-01 MED ORDER — DELFLEX-LC/1.5% DEXTROSE 344 MOSM/L IP SOLN
INTRAPERITONEAL | Status: DC
  Filled 2019-08-01 (×2): qty 3000

## 2019-08-01 MED ORDER — IODIXANOL 320 MG/ML IV SOLN
INTRAVENOUS | Status: DC | PRN
  Administered 2019-08-01: 15:00:00 130 mL via INTRA_ARTERIAL

## 2019-08-01 MED ORDER — DEXTROSE 50 % IV SOLN
1.0000 | Freq: Once | INTRAVENOUS | Status: AC
  Administered 2019-08-01: 50 mL via INTRAVENOUS

## 2019-08-01 MED ORDER — DIPHENHYDRAMINE HCL 50 MG/ML IJ SOLN: 50.0000 mg | Freq: Once | INTRAMUSCULAR | Status: DC | PRN

## 2019-08-01 MED ORDER — MIDAZOLAM HCL 2 MG/ML PO SYRP: 8.0000 mg | ORAL_SOLUTION | Freq: Once | ORAL | Status: DC | PRN

## 2019-08-01 MED ORDER — HEPARIN SODIUM (PORCINE) 1000 UNIT/ML IJ SOLN
INTRAMUSCULAR | Status: DC | PRN
  Administered 2019-08-01: 5000 [IU] via INTRAVENOUS

## 2019-08-01 MED ORDER — MIDAZOLAM HCL 5 MG/5ML IJ SOLN
INTRAMUSCULAR | Status: AC
  Filled 2019-08-01: qty 5

## 2019-08-01 MED ORDER — CEFAZOLIN SODIUM-DEXTROSE 1-4 GM/50ML-% IV SOLN
INTRAVENOUS | Status: AC
  Administered 2019-08-01: 1 g via INTRAVENOUS
  Filled 2019-08-01: qty 50

## 2019-08-01 MED ORDER — FAMOTIDINE 20 MG PO TABS: 40.0000 mg | ORAL_TABLET | Freq: Once | ORAL | Status: DC | PRN

## 2019-08-01 MED ORDER — CEFAZOLIN SODIUM-DEXTROSE 1-4 GM/50ML-% IV SOLN
1.0000 g | Freq: Once | INTRAVENOUS | Status: AC
  Administered 2019-08-01: 14:00:00 1 g via INTRAVENOUS

## 2019-08-01 MED ORDER — ONDANSETRON HCL 4 MG/2ML IJ SOLN
4.0000 mg | Freq: Four times a day (QID) | INTRAMUSCULAR | Status: DC | PRN
  Filled 2019-08-01: qty 2

## 2019-08-01 MED ORDER — DELFLEX-LC/2.5% DEXTROSE 394 MOSM/L IP SOLN
INTRAPERITONEAL | Status: DC
  Filled 2019-08-01 (×2): qty 3000

## 2019-08-01 MED ORDER — MORPHINE SULFATE (PF) 4 MG/ML IV SOLN
2.0000 mg | INTRAVENOUS | Status: DC | PRN
  Administered 2019-08-01 – 2019-08-02 (×2): 2 mg via INTRAVENOUS
  Filled 2019-08-01: qty 1

## 2019-08-01 MED ORDER — ONDANSETRON HCL 4 MG/2ML IJ SOLN
4.0000 mg | Freq: Four times a day (QID) | INTRAMUSCULAR | Status: DC | PRN
  Administered 2019-08-01 – 2019-08-02 (×2): 4 mg via INTRAVENOUS

## 2019-08-01 MED ORDER — HYDROMORPHONE HCL 1 MG/ML IJ SOLN
1.0000 mg | Freq: Once | INTRAMUSCULAR | Status: AC | PRN
  Administered 2019-08-02: 1 mg via INTRAVENOUS
  Filled 2019-08-01: qty 1

## 2019-08-01 MED ORDER — FENTANYL CITRATE (PF) 100 MCG/2ML IJ SOLN
INTRAMUSCULAR | Status: AC
  Filled 2019-08-01: qty 2

## 2019-08-01 MED ORDER — DEXTROSE 50 % IV SOLN
INTRAVENOUS | Status: AC
  Filled 2019-08-01: qty 50

## 2019-08-01 MED ORDER — GENTAMICIN SULFATE 0.1 % EX CREA
1.0000 "application " | TOPICAL_CREAM | Freq: Every day | CUTANEOUS | Status: DC
  Administered 2019-08-06 – 2019-08-12 (×5): 1 via TOPICAL
  Filled 2019-08-01 (×3): qty 15

## 2019-08-01 MED ORDER — HEPARIN SODIUM (PORCINE) 1000 UNIT/ML IJ SOLN
INTRAMUSCULAR | Status: AC
  Filled 2019-08-01: qty 1

## 2019-08-01 MED ORDER — SODIUM CHLORIDE 0.9% FLUSH
3.0000 mL | Freq: Two times a day (BID) | INTRAVENOUS | Status: DC
  Administered 2019-08-01 – 2019-08-03 (×4): 3 mL via INTRAVENOUS

## 2019-08-01 MED ORDER — SODIUM CHLORIDE 0.9 % IV BOLUS
INTRAVENOUS | Status: AC | PRN
  Administered 2019-08-01: 250 mL via INTRAVENOUS

## 2019-08-01 MED ORDER — SODIUM CHLORIDE 0.9% FLUSH: 3.0000 mL | INTRAVENOUS | Status: DC | PRN

## 2019-08-01 MED ORDER — SODIUM CHLORIDE 0.9 % IV SOLN
INTRAVENOUS | Status: DC
  Administered 2019-08-01: 1000 mL via INTRAVENOUS

## 2019-08-01 MED ORDER — MIDAZOLAM HCL 2 MG/2ML IJ SOLN
INTRAMUSCULAR | Status: DC | PRN
  Administered 2019-08-01: 1 mg via INTRAVENOUS

## 2019-08-01 MED ORDER — SODIUM CHLORIDE 0.9 % IV SOLN: 250.0000 mL | INTRAVENOUS | Status: DC | PRN

## 2019-08-01 MED ORDER — MORPHINE SULFATE (PF) 2 MG/ML IV SOLN
INTRAVENOUS | Status: AC
  Filled 2019-08-01: qty 1

## 2019-08-01 MED ORDER — FENTANYL CITRATE (PF) 100 MCG/2ML IJ SOLN
INTRAMUSCULAR | Status: DC | PRN
  Administered 2019-08-01: 25 ug via INTRAVENOUS

## 2019-08-01 MED ORDER — ACETAMINOPHEN 325 MG PO TABS: 650.0000 mg | ORAL_TABLET | ORAL | Status: DC | PRN

## 2019-08-01 MED ORDER — METHYLPREDNISOLONE SODIUM SUCC 125 MG IJ SOLR: 125.0000 mg | Freq: Once | INTRAMUSCULAR | Status: DC | PRN

## 2019-08-01 MED ORDER — HEPARIN BOLUS VIA INFUSION
1200.0000 [IU] | Freq: Once | INTRAVENOUS | Status: AC
  Administered 2019-08-01: 1200 [IU] via INTRAVENOUS
  Filled 2019-08-01: qty 1200

## 2019-08-01 SURGICAL SUPPLY — 25 items
BALLN LUTONIX 018 5X40X130 (BALLOONS) ×3
BALLN MUSTANG 5X60X135 (BALLOONS) ×3
BALLN ULTRASCORE 014 3X40X150 (BALLOONS) ×3
BALLN ULTRASCORE 014 4X40X150 (BALLOONS) ×3
BALLOON LUTONIX 018 5X40X130 (BALLOONS) ×1 IMPLANT
BALLOON MUSTANG 5X60X135 (BALLOONS) ×1 IMPLANT
BALLOON ULTRSCRE 014 3X40X150 (BALLOONS) ×1 IMPLANT
BALLOON ULTRSCRE 014 4X40X150 (BALLOONS) ×1 IMPLANT
CATH BEACON 5 .035 65 KMP TIP (CATHETERS) ×3 IMPLANT
CATH BEACON 5 .035 65 RIM TIP (CATHETERS) ×3 IMPLANT
CATH SEEKER .035X135CM (CATHETERS) ×3 IMPLANT
CATH VERT 5FR 125CM (CATHETERS) ×3 IMPLANT
DEVICE PRESTO INFLATION (MISCELLANEOUS) ×3 IMPLANT
DEVICE STARCLOSE SE CLOSURE (Vascular Products) ×3 IMPLANT
DEVICE TORQUE .025-.038 (MISCELLANEOUS) ×3 IMPLANT
GLIDEWIRE ADV .035X260CM (WIRE) ×3 IMPLANT
NEEDLE ENTRY 21GA 7CM ECHOTIP (NEEDLE) ×3 IMPLANT
PACK ANGIOGRAPHY (CUSTOM PROCEDURE TRAY) ×3 IMPLANT
SET INTRO CAPELLA COAXIAL (SET/KITS/TRAYS/PACK) ×3 IMPLANT
SHEATH BRITE TIP 5FRX11 (SHEATH) ×3 IMPLANT
SHEATH FLEXOR ANSEL2 7FRX45 (SHEATH) ×3 IMPLANT
SYR MEDRAD MARK 7 150ML (SYRINGE) ×3 IMPLANT
TUBING CONTRAST HIGH PRESS 72 (TUBING) ×3 IMPLANT
WIRE J 3MM .035X145CM (WIRE) ×3 IMPLANT
WIRE RUNTHROUGH .014X300CM (WIRE) ×3 IMPLANT

## 2019-08-01 NOTE — Progress Notes (Signed)
Patient noted to have blood sugar of 65. MD notified, one amp dextrose IVP ordered.

## 2019-08-01 NOTE — Progress Notes (Signed)
[facility]  ROUNDING NOTE   Subjective:   Peritoneal dialysis treatment overnight. Tolerated treatment well. UF of 2.6 liters  Patient states his nausea is better controlled.   Objective:  Vital signs in last 24 hours:  Temp:  [97.5 F (36.4 C)-98.1 F (36.7 C)] 97.5 F (36.4 C) (12/01 0813) Pulse Rate:  [66-85] 68 (12/01 0813) Resp:  [17-20] 17 (12/01 0813) BP: (91-116)/(26-59) 106/38 (12/01 0813) SpO2:  [84 %-99 %] 84 % (12/01 0813) Weight:  [94.5 kg-96.3 kg] 94.5 kg (12/01 0549)  Weight change:  Filed Weights   07/31/19 0748 07/31/19 2030 08/01/19 0549  Weight: 98 kg 96.3 kg 94.5 kg    Intake/Output: I/O last 3 completed shifts: In: 398.3 [I.V.:299.3; IV Piggyback:99] Out: 0    Intake/Output this shift:  Total I/O In: -  Out: 2631 [Other:2631]  Physical Exam: General: NAD,   Head: Normocephalic, atraumatic. Moist oral mucosal membranes  Eyes: Anicteric, PERRL  Neck: Supple, trachea midline  Lungs:  Clear to auscultation  Heart: Regular rate and rhythm  Abdomen:  Soft, nontender,   Extremities:  Left foot in clean and dry dressings Right foot +erythema and +edema  Neurologic: Nonfocal, moving all four extremities  Skin: No lesions  Access: PD catheter    Basic Metabolic Panel: Recent Labs  Lab 07/31/19 0817 08/01/19 0519  NA 136 138  K 4.6 4.4  CL 99 103  CO2 23 25  GLUCOSE 216* 128*  BUN 60* 59*  CREATININE 7.36* 7.21*  CALCIUM 7.5* 7.8*    Liver Function Tests: Recent Labs  Lab 07/31/19 0817 08/01/19 0519  AST 23 14*  ALT 19 14  ALKPHOS 110 91  BILITOT 0.8 0.7  PROT 6.4* 5.5*  ALBUMIN 2.6* 2.2*   No results for input(s): LIPASE, AMYLASE in the last 168 hours. No results for input(s): AMMONIA in the last 168 hours.  CBC: Recent Labs  Lab 07/31/19 0817 08/01/19 0003 08/01/19 0842  WBC 11.7* 9.6  --   HGB 8.4* 7.1* 7.4*  HCT 26.9* 23.1*  --   MCV 98.2 97.9  --   PLT 266 221  --     Cardiac Enzymes: No  results for input(s): CKTOTAL, CKMB, CKMBINDEX, TROPONINI in the last 168 hours.  BNP: Invalid input(s): POCBNP  CBG: Recent Labs  Lab 07/31/19 1430 07/31/19 1706 07/31/19 2120 08/01/19 0815 08/01/19 1117  GLUCAP 107* 124* 133* 96 87    Microbiology: Results for orders placed or performed during the hospital encounter of 07/28/19  SARS CORONAVIRUS 2 (TAT 6-24 HRS) Nasopharyngeal Nasopharyngeal Swab     Status: None   Collection Time: 07/28/19 11:24 AM   Specimen: Nasopharyngeal Swab  Result Value Ref Range Status   SARS Coronavirus 2 NEGATIVE NEGATIVE Final    Comment: (NOTE) SARS-CoV-2 target nucleic acids are NOT DETECTED. The SARS-CoV-2 RNA is generally detectable in upper and lower respiratory specimens during the acute phase of infection. Negative results do not preclude SARS-CoV-2 infection, do not rule out co-infections with other pathogens, and should not be used as the sole basis for treatment or other patient management decisions. Negative results must be combined with clinical observations, patient history, and epidemiological information. The expected result is Negative. Fact Sheet for Patients: SugarRoll.be Fact Sheet for Healthcare Providers: https://www.woods-mathews.com/ This test is not yet approved or cleared by the [place] FDA and  has been authorized for detection and/or diagnosis of SARS-CoV-2 by FDA under an Emergency Use Authorization (EUA). This EUA will remain  in effect (meaning this test can be used) for the duration of the COVID-19 declaration under Section 56 4(b)(1) of the Act, 21 U.S.C. section 360bbb-3(b)(1), unless the authorization is terminated or revoked sooner. Performed at [facility], [place] [address] [place], [place] [address]     Coagulation Studies: Recent Labs    07/31/19 0817 08/01/19 0519  LABPROT 14.1 15.6*  INR 1.1 1.3*    Urinalysis: No results for input(s):  COLORURINE, LABSPEC, PHURINE, GLUCOSEU, HGBUR, BILIRUBINUR, KETONESUR, PROTEINUR, UROBILINOGEN, NITRITE, LEUKOCYTESUR in the last 72 hours.  Invalid input(s): APPERANCEUR    Imaging: No results found.   Medications:   . cefTAZidime (FORTAZ)  IV Stopped (07/31/19 1633)  . heparin 1,650 Units/hr (08/01/19 0113)   . aspirin EC  81 mg Oral Daily  . atorvastatin  40 mg Oral Daily  . calcitRIOL  0.25 mcg Oral Daily  . calcium acetate  2,001 mg Oral TID WC  . carvedilol  25 mg Oral BID WC  . clopidogrel  75 mg Oral Q breakfast  . hydrALAZINE  25 mg Oral TID  . insulin aspart  0-5 Units Subcutaneous QHS  . insulin aspart  0-9 Units Subcutaneous TID WC  . insulin aspart  2 Units Subcutaneous TID WC  . isosorbide mononitrate  30 mg Oral Daily  . pantoprazole  40 mg Oral Daily  . sulfamethoxazole-trimethoprim  1 tablet Oral Daily  . tamsulosin  0.4 mg Oral Daily  . vancomycin variable dose per unstable renal function (pharmacist dosing)   Does not apply See admin instructions   acetaminophen **OR** acetaminophen, albuterol, bisacodyl, calcium acetate, HYDROcodone-acetaminophen, ondansetron **OR** ondansetron (ZOFRAN) IV, senna-docusate, traMADol  Assessment/ Plan:  Scott [name] is a 58 y.o. white male with end stage renal disease on peritoneal dialysis, hypertension, diabetes mellitus type II, peripheral vascular disease, left toe amputation, who is admitted to [facility] on 07/31/2019 for Right leg pain [M79.604] Dry gangrene ([place]) [I96] Ischemic leg pain [M79.606, I99.8]  [facility] [address] PD CAPD 4 exchanges 2057mL fills  1. End Stage Renal Disease: peritoneal dialysis for tonight. CCPD 10 hours 4 exchanges 2 liter fills  2. Peripheral vascular disease: left lower extremity healing well. However now with erythema and ischemic changes to his right first toe.  Discharged on IP ceftazidime and vancomycin. He was administrating this at home.  - TMP/SMX, vanco, ceftazidime.   - Appreciate ID and vascular input. Angiogram scheduled for later today.   3. Hypertension:  - carvedilol, hydralazine and isosorbide mononitrate  4. Anemia of chronic kidney disease: hemoglobin 7.4 - Aranesp as outpatient. Will look into Aranesp injection here.   5. Secondary Hyperparathyroidism with hypocalcemia.   - calcitriol - calcium acetate with meals.    LOS: 1 Dequante Tremaine 12/1/202012:14 PM

## 2019-08-01 NOTE — OR Nursing (Signed)
Pt reporting severe 10/10 pain right ankle. Unable to doppler pulses right DP or PT, Dr [name] aware. 2 mg Morphine IV given.

## 2019-08-01 NOTE — Progress Notes (Signed)
Dr. Hulen Skains about patient BP. No new orders given, will continue to monitor.

## 2019-08-01 NOTE — Consult Note (Addendum)
ANTICOAGULATION CONSULT NOTE - Initial Consult  Pharmacy Consult for Heparin Drip Indication: Ischemic Leg  No Known Allergies  Patient Measurements: Height: 5\' 9"  (175.3 cm) Weight: 216 lb (98 kg) IBW/kg (Calculated) : 70.7 Heparin Dosing Weight: 91.3 kg  Vital Signs: Temp: 97.7 F (36.5 C) (11/30 2030) Temp Source: Oral (11/30 2030) BP: 91/26 (11/30 2030) Pulse Rate: 73 (11/30 2030)  Labs: Recent Labs    07/31/19 0817 07/31/19 1540 08/01/19 0003  HGB 8.4*  --  7.1*  HCT 26.9*  --  23.1*  PLT 266  --  221  APTT 37*  --   --   LABPROT 14.1  --   --   INR 1.1  --   --   HEPARINUNFRC  --  0.33 0.24*  CREATININE 7.36*  --   --     Estimated Creatinine Clearance: 12.3 mL/min (A) (by C-G formula based on SCr of 7.36 mg/dL (H)).   Medical History: Past Medical History:  Diagnosis Date  . Acute respiratory failure with hypoxia ([place]) 09/17/2018  . Arthritis    "left arm; right leg" (12/13/2014)  . Asthma   . CHF (congestive heart failure) ([place])   . Chronic disease anemia    [name] 12/13/2014  . Chronic kidney disease (CKD), stage IV (severe) ([place])    [name] 12/13/2014... on dialysis  . Complication of anesthesia    unable to urinate after CAPD urgery  . Continuous ambulatory peritoneal dialysis status ([place])   . Coronary artery disease    [name] 12/13/2014  . Depression   . Dysrhythmia    patient unaware of irregular heartbeat  . GERD (gastroesophageal reflux disease)   . High cholesterol    [name] 12/13/2014  . Hypertension   . Non-Q wave myocardial infarction ([place])    [name] 12/13/2014  . PVD (peripheral vascular disease) ([place])    [name] 12/13/2014  . Type II diabetes mellitus ([facility])     Medications:  No PTA anticoagulant meds per record  Assessment: Scott [name] is a 58 y.o. male with a history of CHF, CKD, GERD, hypertension, PAD who comes the ED complaining of worsening of his chronic right leg pain which is below the knee and all the way down to the  foot.  Over the last 2 days it has rapidly worsened and is now intractable, unbearable.  Pharmacy has been consulted to initiate/monitor a full-dose heparin drip for ischemic leg.  Pt received 5000 unit bolus followed by 1540 units/hr.  11/30 1540 HL 0.33 - level therapeutic x 1 12/1 @ 0003 HL: 0.24   Goal of Therapy:  Heparin level 0.3-0.7 units/ml Monitor platelets by anticoagulation protocol: Yes   Plan:  12/1 @ 0003 HL: 0.24. Heparin level is subtherapeutic. Hgb down from 8.4 to 7.1.    Will order 1200 unit heparin bolus and increase infusion rate to1650 units/hr. Recheck HL in 6 hours. Continue to monitor Hgb.  Daily CBC's while on heparin drip.  Pernell Dupre, PharmD, BCPS Clinical Pharmacist 08/01/2019 1:00 AM

## 2019-08-01 NOTE — Plan of Care (Signed)
Angiography with balloon plasty performed today.  Bilat pedal pulses assessed by doppler.   Problem: Education: Goal: Knowledge of General Education information will improve Description: Including pain rating scale, medication(s)/side effects and non-pharmacologic comfort measures Outcome: Progressing   Problem: Health Behavior/Discharge Planning: Goal: Ability to manage health-related needs will improve Outcome: Progressing   Problem: Clinical Measurements: Goal: Ability to maintain clinical measurements within normal limits will improve Outcome: Progressing Goal: Will remain free from infection Outcome: Progressing Goal: Diagnostic test results will improve Outcome: Progressing Goal: Respiratory complications will improve Outcome: Progressing Goal: Cardiovascular complication will be avoided Outcome: Progressing   Problem: Activity: Goal: Risk for activity intolerance will decrease Outcome: Progressing   Problem: Nutrition: Goal: Adequate nutrition will be maintained Outcome: Progressing   Problem: Coping: Goal: Level of anxiety will decrease Outcome: Progressing   Problem: Elimination: Goal: Will not experience complications related to bowel motility Outcome: Progressing Goal: Will not experience complications related to urinary retention Outcome: Progressing   Problem: Pain Managment: Goal: General experience of comfort will improve Outcome: Progressing   Problem: Safety: Goal: Ability to remain free from injury will improve Outcome: Progressing   Problem: Skin Integrity: Goal: Risk for impaired skin integrity will decrease Outcome: Progressing

## 2019-08-01 NOTE — Op Note (Signed)
Scott [name] Percutaneous Study/Intervention Procedural Note   Date of Surgery: 08/01/2019  Surgeon:  Katha Cabal, MD.  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 Introduction catheter into right lower extremity 3rd order catheter placement  2. Contrast injection right lower extremity for distal runoff   3. Percutaneous transluminal angioplasty right profunda femoris artery  4. Star close closure left common femoral arteriotomy  Anesthesia: Conscious sedation was administered under my direct supervision by the interventional radiology RN. IV Versed plus fentanyl were utilized. Continuous ECG, pulse oximetry and blood pressure was monitored throughout the entire procedure.  Conscious sedation was for a total of 80 minutes.  Sheath: 7 [place] Ansell left common femoral retrograde  Contrast: 130 cc  Fluoroscopy Time: 19.6 minutes  Indications: [name] presents with increasing pain associated with gangrene of the right great toe.  This is a limb threatening situation.  Angiography with hope for intervention has been recommended for limb salvage.  The risks and benefits are reviewed all questions answered patient agrees to proceed.  Procedure: HEVER CASTILLEJA is a 58 y.o. y.o. male who was identified and appropriate procedural time out was performed. The patient was then placed supine on the table and prepped and draped in the usual sterile fashion.   Ultrasound was placed in the sterile sleeve and the left groin was evaluated the left common femoral artery was echolucent and pulsatile indicating patency.  Image was recorded for the permanent record and under real-time visualization a microneedle was inserted into the common femoral artery microwire followed  by a micro-sheath.  A J-wire was then advanced through the micro-sheath and a  5 [place] sheath was then inserted over a J-wire. J-wire was then advanced and a 5 French rim catheter was positioned at the level of the bifurcation and a LAO view of the pelvis was obtained.  Aortogram was not obtained as he is recently had intervention of the left lower extremity and aortogram was obtained at that time.  Imaging was adequate.  Subsequently a rim catheter with the advantage wire was used to cross the aortic bifurcation the catheter wire were advanced down into the right distal external iliac artery. Oblique view of the femoral bifurcation was then obtained and subsequently the wire was reintroduced and the [name] catheter negotiated into the SFA representing third order catheter placement. Distal runoff was then performed.  Diagnostic interpretation: The aortic bifurcation is widely patent.  The right common and external iliac arteries are widely patent.  The common femoral artery is patent has a smooth 30% lesion in its midportion.  The origin of the profunda femoris on the right is a subtotal occlusion more distally the profunda appears to have significant atherosclerotic changes but there are no hemodynamically significant lesions.  The SFA is patent in its proximal two thirds.  At approximately Hunter's canal it occludes.  There is nonvisualization of the tibial arteries initially.  Later in the case after successful angioplasty of the origin of the profunda femoris there is faint filling of the posterior tibial from its origin down to the ankle.  Based on these findings I elected to move forward with attempted intervention for limb salvage.  5000 units of heparin was then given and allowed to circulate and a 7 [place] Ansell sheath was advanced up and over the bifurcation and positioned in the femoral artery  An advantage wire and  a [name] catheter were then negotiated down to the SFA occlusion.  Attempts at  crossing the occlusion were not successful given the inability to see a distal target I felt that treating the profunda femoris would be the best way to improve distal flow.  The Ansell sheath was repositioned slightly and a 65 [name] catheter was advanced over the wire with the advantage wire I was able to cross the subtotal occlusion of the profunda femoris at its origin.  Hand-injection of contrast through the [name] verified intraluminal positioning.  I then advanced a 0.014 wire and began with a 3 mm x 40 mm ultra score balloon this was inflated to 12 atm for 1 minute.  Next, a 4 mm x 40 mm ultra score balloon was used again inflated to 12 atm for 1 minute.  Following this second balloon inflation I used a 5 mm x 40 mm Lutonix drug-eluting balloon.  Inflation was to 14 atm for 1 minute.  Follow-up imaging demonstrated less than 15% residual stenosis with a dramatic improvement in profunda flow.  With the sheath now sitting in the mid common femoral hand-injection of contrast using a 30 cc bolus was performed.  This did allow visualization of a posterior tibial which I could not identify before and this posterior tibial does appear to be patent down to the ankle.  After review of these images the sheath is pulled into the left external iliac oblique of the common femoral is obtained and a Star close device deployed. There no immediate complications.   Findings:  The right common and external iliac arteries are widely patent.  The right common femoral artery is patent has a smooth 30% lesion in its midportion.  The origin of the profunda femoris on the right is a subtotal occlusion more distally the profunda appears to have significant atherosclerotic changes but there are no hemodynamically significant lesions.  The SFA is patent in its proximal two thirds.  At approximately Hunter's canal it occludes.  There is nonvisualization of the tibial arteries initially.  Following angioplasty to 5 mm the  profunda femoris is now patent with in-line flow and looks quite nice.  Injection of contrast now demonstrates the posterior tibial artery is patent from proximally its origin down to the ankle.  This would allow for pedal access to see if we could cross the occluded SFA popliteal lesion from below.  Summary: Successful recanalization right profunda femoris for limb salvage    Disposition: Patient was taken to the recovery room in stable condition having tolerated the procedure well.  Schnier, [name] 08/01/2019,3:36 PM

## 2019-08-01 NOTE — Consult Note (Signed)
ANTICOAGULATION CONSULT NOTE  Pharmacy Consult for Heparin Drip Indication: Ischemic Leg  No Known Allergies  Patient Measurements: Height: 5\' 9"  (175.3 cm) Weight: 208 lb 4.8 oz (94.5 kg) IBW/kg (Calculated) : 70.7 Heparin Dosing Weight: 91.3 kg  Vital Signs: Temp: 97.5 F (36.4 C) (12/01 0813) Temp Source: Oral (12/01 0813) BP: 106/38 (12/01 0813) Pulse Rate: 68 (12/01 0813)  Labs: Recent Labs    07/31/19 0817 07/31/19 1540 08/01/19 0003 08/01/19 0519 08/01/19 0842  HGB 8.4*  --  7.1*  --  7.4*  HCT 26.9*  --  23.1*  --   --   PLT 266  --  221  --   --   APTT 37*  --   --  99*  --   LABPROT 14.1  --   --  15.6*  --   INR 1.1  --   --  1.3*  --   HEPARINUNFRC  --  0.33 0.24*  --  0.51  CREATININE 7.36*  --   --  7.21*  --     Estimated Creatinine Clearance: 12.4 mL/min (A) (by C-G formula based on SCr of 7.21 mg/dL (H)).   Medical History: Past Medical History:  Diagnosis Date  . Acute respiratory failure with hypoxia ([place]) 09/17/2018  . Arthritis    "left arm; right leg" (12/13/2014)  . Asthma   . CHF (congestive heart failure) ([place])   . Chronic disease anemia    [name] 12/13/2014  . Chronic kidney disease (CKD), stage IV (severe) ([place])    [name] 12/13/2014... on dialysis  . Complication of anesthesia    unable to urinate after CAPD urgery  . Continuous ambulatory peritoneal dialysis status ([place])   . Coronary artery disease    [name] 12/13/2014  . Depression   . Dysrhythmia    patient unaware of irregular heartbeat  . GERD (gastroesophageal reflux disease)   . High cholesterol    [name] 12/13/2014  . Hypertension   . Non-Q wave myocardial infarction ([place])    [name] 12/13/2014  . PVD (peripheral vascular disease) ([place])    [name] 12/13/2014  . Type II diabetes mellitus ([facility])     Medications:  No PTA anticoagulant meds per record  Assessment: Scott [name] is a 58 y.o. male with a history of CHF, CKD, GERD, hypertension, PAD who comes the ED  complaining of worsening of his chronic right leg pain which is below the knee and all the way down to the foot.  Over the last 2 days it has rapidly worsened and is now intractable, unbearable.  Pharmacy has been consulted to initiate/monitor a full-dose heparin drip for ischemic leg.    Goal of Therapy:  Heparin level 0.3-0.7 units/ml Monitor platelets by anticoagulation protocol: Yes   Plan:  12/01 0842 HL 0.51 therapeutic x 1. Will continue heparin drip at 1650 units/hr. HL at 1700 to confirm.  Daily CBC's while on heparin drip.  Tawnya Crook, PharmD Clinical Pharmacist 08/01/2019 9:59 AM

## 2019-08-01 NOTE — Progress Notes (Signed)
Established peritoneal patient known at [place] ([place]). Patient self transports to doctor appointments. Please contact me with any dialysis placement concerns.   Elvera Bicker Dialysis Coordinator [phone]

## 2019-08-01 NOTE — Progress Notes (Addendum)
Progress Note    Scott [name]  LZJ:[id number] DOB: [birth year]  DOA: 07/31/2019 PCP: [facility] [employer]      Brief Narrative:    Medical records reviewed and are as summarized below:  Scott [name] is an 58 y.o. male with medical history significant of peripheral arterial disease, diabetes mellitus type 2, ESRD on peritoneal dialysis presents to the hospital with complaints of right lower extremity pain.  Patient was admitted here last week for left lower extremity pain diagnosed with osteomyelitis requiring angiogram by vascular followed by  amputation of left second toe by podiatry.  He was eventually discharged on IV antibiotics.  He had been feeling better but started experiencing right lower extremity pain over the last couple of days.  Initially pain was with exertion but now has progressed to persistent and his toes have darkened.      Assessment/Plan:   Principal Problem:   Leg pain, right Active Problems:   Type 2 diabetes mellitus with other diabetic kidney complication (HCC)   GERD (gastroesophageal reflux disease)   ESRD (end stage renal disease) (HCC)   Tobacco abuse   Anemia in ESRD (end-stage renal disease) (HCC)   Gastro-esophageal reflux disease with esophagitis   Type II diabetes mellitus (HCC)   PVD (peripheral vascular disease) (HCC)   PAD (peripheral artery disease) (HCC)   Osteomyelitis of second toe of left foot (HCC)   Body mass index is 30.76 kg/m.   Severe peripheral vascular disease with right big toe gangrene: Continue IV heparin infusion and monitor PTT per protocol.  Analgesics as needed for pain.  Patient has been evaluated by vascular surgeon with plan for surgical intervention today.  Borderline low blood pressure: Asymptomatic.  Monitor BP closely  Anemia of chronic disease: Monitor H&H and transfuse as needed.  ESRD on peritoneal dialysis: Follow-up with nephrologist for management.  Type 2 diabetes mellitus:  NovoLog as needed for hyperglycemia  CAD/chronic systolic CHF with EF of 35 to 40% in January 2020: Continue fluid management with peritoneal dialysis.  Continue Coreg if BP allows.  Continue aspirin and Lipitor  Recent osteomyelitis of left foot: Continue antibiotics (ceftazidime, vancomycin and Bactrim)   Family Communication/Anticipated D/C date and plan/Code Status   DVT prophylaxis: IV heparin infusion Code Status: Full code Family Communication: Plan discussed with the patient Disposition Plan: Unknown at this time      Subjective:   He complains that his right foot is sore but is not too bad.  He has no other complaints.  No shortness of breath, dizziness or chest pain.  Objective:    Vitals:   07/31/19 2000 07/31/19 2030 08/01/19 0549 08/01/19 0813  BP: (!) 99/49 (!) 91/26 (!) 94/41 (!) 106/38  Pulse:  73 66 68  Resp:  18 19 17   Temp:  97.7 F (36.5 C) 97.6 F (36.4 C) (!) 97.5 F (36.4 C)  TempSrc:  Oral Oral Oral  SpO2:  94% 94% (!) 84%  Weight:  96.3 kg 94.5 kg   Height:  5\' 9"  (1.753 m)      Intake/Output Summary (Last 24 hours) at 08/01/2019 1027 Last data filed at 08/01/2019 0551 Gross per 24 hour  Intake 398.31 ml  Output 0 ml  Net 398.31 ml   Filed Weights   07/31/19 0748 07/31/19 2030 08/01/19 0549  Weight: 98 kg 96.3 kg 94.5 kg    Exam:  GEN: NAD SKIN: No rash EYES: EOMI ENT: MMM CV: RRR PULM: CTA  B ABD: soft, obese, NT, +BS,  PD catheter in the left side of his abdomen CNS: AAO x 3, non focal EXT: right gangrenous big toe. Left foot surgical wound with sutures covered with dressing    Data Reviewed:   I have personally reviewed following labs and imaging studies:  Labs: Labs show the following:   Basic Metabolic Panel: Recent Labs  Lab 07/31/19 0817 08/01/19 0519  NA 136 138  K 4.6 4.4  CL 99 103  CO2 23 25  GLUCOSE 216* 128*  BUN 60* 59*  CREATININE 7.36* 7.21*  CALCIUM 7.5* 7.8*   GFR Estimated Creatinine  Clearance: 12.4 mL/min (A) (by C-G formula based on SCr of 7.21 mg/dL (H)). Liver Function Tests: Recent Labs  Lab 07/31/19 0817 08/01/19 0519  AST 23 14*  ALT 19 14  ALKPHOS 110 91  BILITOT 0.8 0.7  PROT 6.4* 5.5*  ALBUMIN 2.6* 2.2*   No results for input(s): LIPASE, AMYLASE in the last 168 hours. No results for input(s): AMMONIA in the last 168 hours. Coagulation profile Recent Labs  Lab 07/31/19 0817 08/01/19 0519  INR 1.1 1.3*    CBC: Recent Labs  Lab 07/31/19 0817 08/01/19 0003 08/01/19 0842  WBC 11.7* 9.6  --   HGB 8.4* 7.1* 7.4*  HCT 26.9* 23.1*  --   MCV 98.2 97.9  --   PLT 266 221  --    Cardiac Enzymes: No results for input(s): CKTOTAL, CKMB, CKMBINDEX, TROPONINI in the last 168 hours. BNP (last 3 results) No results for input(s): PROBNP in the last 8760 hours. CBG: Recent Labs  Lab 07/31/19 1430 07/31/19 1706 07/31/19 2120 08/01/19 0815  GLUCAP 107* 124* 133* 96   D-Dimer: No results for input(s): DDIMER in the last 72 hours. Hgb A1c: No results for input(s): HGBA1C in the last 72 hours. Lipid Profile: No results for input(s): CHOL, HDL, LDLCALC, TRIG, CHOLHDL, LDLDIRECT in the last 72 hours. Thyroid function studies: No results for input(s): TSH, T4TOTAL, T3FREE, THYROIDAB in the last 72 hours.  Invalid input(s): FREET3 Anemia work up: No results for input(s): VITAMINB12, FOLATE, FERRITIN, TIBC, IRON, RETICCTPCT in the last 72 hours. Sepsis Labs: Recent Labs  Lab 07/31/19 0817 08/01/19 0003  WBC 11.7* 9.6    Microbiology Recent Results (from the past 240 hour(s))  SARS CORONAVIRUS 2 (TAT 6-24 HRS) Nasopharyngeal Nasopharyngeal Swab     Status: None   Collection Time: 07/28/19 11:24 AM   Specimen: Nasopharyngeal Swab  Result Value Ref Range Status   SARS Coronavirus 2 NEGATIVE NEGATIVE Final    Comment: (NOTE) SARS-CoV-2 target nucleic acids are NOT DETECTED. The SARS-CoV-2 RNA is generally detectable in upper and  lower respiratory specimens during the acute phase of infection. Negative results do not preclude SARS-CoV-2 infection, do not rule out co-infections with other pathogens, and should not be used as the sole basis for treatment or other patient management decisions. Negative results must be combined with clinical observations, patient history, and epidemiological information. The expected result is Negative. Fact Sheet for Patients: SugarRoll.be Fact Sheet for Healthcare Providers: https://www.woods-mathews.com/ This test is not yet approved or cleared by the [place] FDA and  has been authorized for detection and/or diagnosis of SARS-CoV-2 by FDA under an Emergency Use Authorization (EUA). This EUA will remain  in effect (meaning this test can be used) for the duration of the COVID-19 declaration under Section 56 4(b)(1) of the Act, 21 U.S.C. section 360bbb-3(b)(1), unless the authorization is terminated or revoked  sooner. Performed at [facility], [place] [address], [place], [place] [address]     Procedures and diagnostic studies:  No results found.  Medications:   . aspirin EC  81 mg Oral Daily  . atorvastatin  40 mg Oral Daily  . calcitRIOL  0.25 mcg Oral Daily  . calcium acetate  2,001 mg Oral TID WC  . carvedilol  25 mg Oral BID WC  . clopidogrel  75 mg Oral Q breakfast  . hydrALAZINE  25 mg Oral TID  . insulin aspart  0-5 Units Subcutaneous QHS  . insulin aspart  0-9 Units Subcutaneous TID WC  . insulin aspart  2 Units Subcutaneous TID WC  . isosorbide mononitrate  30 mg Oral Daily  . pantoprazole  40 mg Oral Daily  . sulfamethoxazole-trimethoprim  1 tablet Oral Daily  . tamsulosin  0.4 mg Oral Daily  . vancomycin variable dose per unstable renal function (pharmacist dosing)   Does not apply See admin instructions   Continuous Infusions: . cefTAZidime (FORTAZ)  IV Stopped (07/31/19 1633)  . heparin 1,650 Units/hr  (08/01/19 0113)     LOS: 1 day   E[facility]  [facility]   *Please refer to [email or web address], password [email or web address] to get updated schedule on who will round on this patient, as hospitalists switch teams weekly. If 7PM-7AM, please contact night-coverage at [email or web address], password [email or web address] for any overnight needs.  08/01/2019, 10:27 AM

## 2019-08-01 NOTE — H&P (Signed)
[facility] History & Physical Update  The patient was interviewed and re-examined.  The patient's previous History and Physical has been reviewed and is unchanged.  There is no change in the plan of care. We plan to proceed with the scheduled procedure.  Hortencia Pilar, MD  08/01/2019, 1:42 PM

## 2019-08-02 ENCOUNTER — Encounter: Payer: Self-pay | Admitting: Vascular Surgery

## 2019-08-02 ENCOUNTER — Other Ambulatory Visit: Payer: Commercial Managed Care - HMO

## 2019-08-02 DIAGNOSIS — N186 End stage renal disease: Secondary | ICD-10-CM | POA: Diagnosis not present

## 2019-08-02 DIAGNOSIS — E1152 Type 2 diabetes mellitus with diabetic peripheral angiopathy with gangrene: Secondary | ICD-10-CM | POA: Diagnosis not present

## 2019-08-02 DIAGNOSIS — I96 Gangrene, not elsewhere classified: Secondary | ICD-10-CM | POA: Diagnosis not present

## 2019-08-02 DIAGNOSIS — E1122 Type 2 diabetes mellitus with diabetic chronic kidney disease: Secondary | ICD-10-CM | POA: Diagnosis not present

## 2019-08-02 LAB — BASIC METABOLIC PANEL
Anion gap: 11 (ref 5–15)
BUN: 55 mg/dL — ABNORMAL HIGH (ref 6–20)
CO2: 25 mmol/L (ref 22–32)
Calcium: 8 mg/dL — ABNORMAL LOW (ref 8.9–10.3)
Chloride: 102 mmol/L (ref 98–111)
Creatinine, Ser: 7.42 mg/dL — ABNORMAL HIGH (ref 0.61–1.24)
GFR calc Af Amer: 8 mL/min — ABNORMAL LOW (ref >60)
GFR calc non Af Amer: 7 mL/min — ABNORMAL LOW (ref >60)
Glucose, Bld: 126 mg/dL — ABNORMAL HIGH (ref 70–99)
Potassium: 4.2 mmol/L (ref 3.5–5.1)
Sodium: 138 mmol/L (ref 135–145)

## 2019-08-02 LAB — CBC WITH DIFFERENTIAL/PLATELET
Abs Immature Granulocytes: 0.06 10*3/uL (ref 0.00–0.07)
Basophils Absolute: 0.1 10*3/uL (ref 0.0–0.1)
Basophils Relative: 1 %
Eosinophils Absolute: 0.4 10*3/uL (ref 0.0–0.5)
Eosinophils Relative: 4 %
HCT: 24.6 % — ABNORMAL LOW (ref 39.0–52.0)
Hemoglobin: 7.7 g/dL — ABNORMAL LOW (ref 13.0–17.0)
Immature Granulocytes: 1 %
Lymphocytes Relative: 10 %
Lymphs Abs: 1 10*3/uL (ref 0.7–4.0)
MCH: 30.7 pg (ref 26.0–34.0)
MCHC: 31.3 g/dL (ref 30.0–36.0)
MCV: 98 fL (ref 80.0–100.0)
Monocytes Absolute: 0.8 10*3/uL (ref 0.1–1.0)
Monocytes Relative: 8 %
Neutro Abs: 7.4 10*3/uL (ref 1.7–7.7)
Neutrophils Relative %: 76 %
Platelets: 244 10*3/uL (ref 150–400)
RBC: 2.51 MIL/uL — ABNORMAL LOW (ref 4.22–5.81)
RDW: 16.2 % — ABNORMAL HIGH (ref 11.5–15.5)
WBC: 9.7 10*3/uL (ref 4.0–10.5)
nRBC: 0 % (ref 0.0–0.2)

## 2019-08-02 LAB — HEPARIN LEVEL (UNFRACTIONATED)
Heparin Unfractionated: 0.55 IU/mL (ref 0.30–0.70)
Heparin Unfractionated: 0.88 IU/mL — ABNORMAL HIGH (ref 0.30–0.70)
Heparin Unfractionated: 0.33 IU/mL (ref 0.30–0.70)

## 2019-08-02 LAB — GLUCOSE, CAPILLARY
Glucose-Capillary: 112 mg/dL — ABNORMAL HIGH (ref 70–99)
Glucose-Capillary: 99 mg/dL (ref 70–99)
Glucose-Capillary: 112 mg/dL — ABNORMAL HIGH (ref 70–99)
Glucose-Capillary: 129 mg/dL — ABNORMAL HIGH (ref 70–99)

## 2019-08-02 LAB — VANCOMYCIN, RANDOM: Vancomycin Rm: 25

## 2019-08-02 MED ORDER — BISACODYL 10 MG RE SUPP: 10.0000 mg | Freq: Every day | RECTAL | Status: DC | PRN

## 2019-08-02 MED ORDER — POLYETHYLENE GLYCOL 3350 17 G PO PACK
17.0000 g | PACK | Freq: Every day | ORAL | Status: DC
  Administered 2019-08-02 – 2019-08-03 (×2): 17 g via ORAL
  Filled 2019-08-02 (×2): qty 1

## 2019-08-02 MED ORDER — DELFLEX-LC/1.5% DEXTROSE 344 MOSM/L IP SOLN
INTRAPERITONEAL | Status: DC
  Administered 2019-08-08: 23:00:00 via INTRAPERITONEAL
  Filled 2019-08-02 (×10): qty 3000

## 2019-08-02 MED ORDER — GABAPENTIN 600 MG PO TABS
300.0000 mg | ORAL_TABLET | Freq: Every day | ORAL | Status: DC
  Administered 2019-08-02 – 2019-08-05 (×4): 300 mg via ORAL
  Filled 2019-08-02 (×4): qty 1

## 2019-08-02 MED ORDER — SENNOSIDES-DOCUSATE SODIUM 8.6-50 MG PO TABS
2.0000 | ORAL_TABLET | Freq: Two times a day (BID) | ORAL | Status: DC
  Administered 2019-08-02 – 2019-08-12 (×17): 2 via ORAL
  Filled 2019-08-02 (×18): qty 2

## 2019-08-02 NOTE — Consult Note (Signed)
ANTICOAGULATION CONSULT NOTE  Pharmacy Consult for Heparin Drip Indication: Ischemic Leg  No Known Allergies  Patient Measurements: Height: 5\' 9"  (175.3 cm) Weight: 208 lb 4.8 oz (94.5 kg) IBW/kg (Calculated) : 70.7 Heparin Dosing Weight: 91.3 kg  Vital Signs: Temp: 97.7 F (36.5 C) (12/01 1928) Temp Source: Oral (12/01 1928) BP: 92/34 (12/01 1928) Pulse Rate: 66 (12/01 1928)  Labs: Recent Labs    07/31/19 0817  08/01/19 0003 08/01/19 0519 08/01/19 0842 08/01/19 2254  HGB 8.4*  --  7.1*  --  7.4*  --   HCT 26.9*  --  23.1*  --   --   --   PLT 266  --  221  --   --   --   APTT 37*  --   --  99*  --   --   LABPROT 14.1  --   --  15.6*  --   --   INR 1.1  --   --  1.3*  --   --   HEPARINUNFRC  --    < > 0.24*  --  0.51 0.87*  CREATININE 7.36*  --   --  7.21*  --   --    < > = values in this interval not displayed.    Estimated Creatinine Clearance: 12.4 mL/min (A) (by C-G formula based on SCr of 7.21 mg/dL (H)).   Medical History: Past Medical History:  Diagnosis Date  . Acute respiratory failure with hypoxia ([place]) 09/17/2018  . Arthritis    "left arm; right leg" (12/13/2014)  . Asthma   . CHF (congestive heart failure) ([place])   . Chronic disease anemia    [name] 12/13/2014  . Chronic kidney disease (CKD), stage IV (severe) ([place])    [name] 12/13/2014... on dialysis  . Complication of anesthesia    unable to urinate after CAPD urgery  . Continuous ambulatory peritoneal dialysis status ([place])   . Coronary artery disease    [name] 12/13/2014  . Depression   . Dysrhythmia    patient unaware of irregular heartbeat  . GERD (gastroesophageal reflux disease)   . High cholesterol    [name] 12/13/2014  . Hypertension   . Non-Q wave myocardial infarction ([place])    [name] 12/13/2014  . PVD (peripheral vascular disease) ([place])    [name] 12/13/2014  . Type II diabetes mellitus (HCC)     Medications:  No PTA anticoagulant meds per record  Assessment: Scott [name] is  a 58 y.o. male with a history of CHF, CKD, GERD, hypertension, PAD who comes the ED complaining of worsening of his chronic right leg pain which is below the knee and all the way down to the foot.  Over the last 2 days it has rapidly worsened and is now intractable, unbearable.  Pharmacy has been consulted to initiate/monitor a full-dose heparin drip for ischemic leg.  12/1 @ 0842: HL 0.51. Level therapeutic x1.  12/1 @ 2254 HL: 0.87. Level is supertherapeutic.     Goal of Therapy:  Heparin level 0.3-0.7 units/ml Monitor platelets by anticoagulation protocol: Yes   Plan:  12/1 @ 2254 HL: 0.87. Level is supertherapeutic . Will reduce infusion rate to  Heparin 1450 units/hr.   Will recheck HL 6 hours after infusion rate change.   Daily CBC's while on heparin drip.  Pernell Dupre, PharmD, BCPS Clinical Pharmacist 08/02/2019 12:15 AM

## 2019-08-02 NOTE — Plan of Care (Signed)
  Problem: Education: Goal: Knowledge of General Education information will improve Description: Including pain rating scale, medication(s)/side effects and non-pharmacologic comfort measures Outcome: Progressing   Problem: Health Behavior/Discharge Planning: Goal: Ability to manage health-related needs will improve Outcome: Progressing   Problem: Clinical Measurements: Goal: Ability to maintain clinical measurements within normal limits will improve Outcome: Progressing Goal: Will remain free from infection Outcome: Progressing Goal: Diagnostic test results will improve Outcome: Progressing Goal: Respiratory complications will improve Outcome: Progressing Goal: Cardiovascular complication will be avoided Outcome: Not Progressing Note: Pt complaining of right foot pain and being cold.  Foot is cold to the touch; and pedal and post-tibial pulses are located by doppler only.   Problem: Activity: Goal: Risk for activity intolerance will decrease Outcome: Progressing   Problem: Nutrition: Goal: Adequate nutrition will be maintained Outcome: Progressing   Problem: Coping: Goal: Level of anxiety will decrease Outcome: Progressing   Problem: Elimination: Goal: Will not experience complications related to bowel motility Outcome: Progressing Goal: Will not experience complications related to urinary retention Outcome: Progressing   Problem: Pain Managment: Goal: General experience of comfort will improve Outcome: Progressing   Problem: Safety: Goal: Ability to remain free from injury will improve Outcome: Progressing   Problem: Skin Integrity: Goal: Risk for impaired skin integrity will decrease Outcome: Progressing

## 2019-08-02 NOTE — Progress Notes (Signed)
   08/02/19 1930  Neurological  Level of Consciousness Alert  Orientation Level Oriented X4  Respiratory  Respiratory Pattern Regular;Unlabored  Cardiac  Pulse Regular  Heart Sounds S1, S2  Sitting up in chair no c/os stable for PD tx.

## 2019-08-02 NOTE — Plan of Care (Signed)

## 2019-08-02 NOTE — Progress Notes (Addendum)
When entering room to change rate on heparin drip, noted patient on the floor. Pt stated that he was trying to put his pants on and slid out of the bed, he states that he did not get hurt or hit his head, he is A&O xs 4 and states "I hate that this happened" pt apologizes and states "I didn't fall"  Patient has been educated to call for help. Yellow socks, yellow armband, and bed alarm are on. Supervisor and NP O[place] have been notified. No new orders received. We will continue to monitor.

## 2019-08-02 NOTE — Consult Note (Signed)
ANTICOAGULATION CONSULT NOTE  Pharmacy Consult for Heparin Drip Indication: Ischemic Leg  No Known Allergies  Patient Measurements: Height: 5\' 9"  (175.3 cm) Weight: 202 lb 9.6 oz (91.9 kg) IBW/kg (Calculated) : 70.7 Heparin Dosing Weight: 91.3 kg  Vital Signs: Temp: 97.9 F (36.6 C) (12/02 0725) Temp Source: Oral (12/02 0725) BP: 108/44 (12/02 0725) Pulse Rate: 75 (12/02 0725)  Labs: Recent Labs    07/31/19 0817  08/01/19 0003 08/01/19 0519 08/01/19 0842 08/01/19 2254 08/02/19 0620 08/02/19 1300  HGB 8.4*  --  7.1*  --  7.4*  --  7.7*  --   HCT 26.9*  --  23.1*  --   --   --  24.6*  --   PLT 266  --  221  --   --   --  244  --   APTT 37*  --   --  99*  --   --   --   --   LABPROT 14.1  --   --  15.6*  --   --   --   --   INR 1.1  --   --  1.3*  --   --   --   --   HEPARINUNFRC  --    < > 0.24*  --  0.51 0.87* 0.55 0.88*  CREATININE 7.36*  --   --  7.21*  --   --  7.42*  --    < > = values in this interval not displayed.    Estimated Creatinine Clearance: 11.9 mL/min (A) (by C-G formula based on SCr of 7.42 mg/dL (H)).   Medical History: Past Medical History:  Diagnosis Date  . Acute respiratory failure with hypoxia ([place]) 09/17/2018  . Arthritis    "left arm; right leg" (12/13/2014)  . Asthma   . CHF (congestive heart failure) ([place])   . Chronic disease anemia    Scott [name] 12/13/2014  . Chronic kidney disease (CKD), stage IV (severe) ([place])    Scott [name] 12/13/2014... on dialysis  . Complication of anesthesia    unable to urinate after CAPD urgery  . Continuous ambulatory peritoneal dialysis status ([place])   . Coronary artery disease    Scott [name] 12/13/2014  . Depression   . Dysrhythmia    patient unaware of irregular heartbeat  . GERD (gastroesophageal reflux disease)   . High cholesterol    Scott [name] 12/13/2014  . Hypertension   . Non-Q wave myocardial infarction ([place])    Scott [name] 12/13/2014  . PVD (peripheral vascular disease) ([place])    Scott [name] 12/13/2014  . Type II  diabetes mellitus (HCC)     Medications:  No PTA anticoagulant meds per record  Assessment: Scott [name] is a 58 y.o. male with a history of CHF, CKD, GERD, hypertension, PAD who comes the ED complaining of worsening of his chronic right leg pain which is below the knee and all the way down to the foot.  Over the last 2 days it has rapidly worsened and is now intractable, unbearable.  Pharmacy has been consulted to initiate/monitor a full-dose heparin drip for ischemic leg.  12/1 @ 0842: HL 0.51. Level therapeutic x1.  12/1 @ 2254 HL: 0.87. Level is supratherapeutic  12/2 @ 0620 HL: 0.55 Therapeutic  12/2 @ 1300 HL: 0.88 Supratherapeutic   Goal of Therapy:  Heparin level 0.3-0.7 units/ml Monitor platelets by anticoagulation protocol: Yes   Plan:  12/2 1300 HL 0.88 supratherapeutic. Spoke with RN. No issues. Will decrease heparin drip to 1300  units/hr. HL ordered for 2200.  Daily CBC's while on heparin drip.  Tawnya Crook, PharmD Clinical Pharmacist 08/02/2019 1:35 PM

## 2019-08-02 NOTE — Consult Note (Signed)
ANTICOAGULATION CONSULT NOTE  Pharmacy Consult for Heparin Drip Indication: Ischemic Leg  No Known Allergies  Patient Measurements: Height: 5\' 9"  (175.3 cm) Weight: 202 lb 9.6 oz (91.9 kg) IBW/kg (Calculated) : 70.7 Heparin Dosing Weight: 91.3 kg  Vital Signs: Temp: 97.9 F (36.6 C) (12/02 2009) Temp Source: Oral (12/02 2009) BP: 101/30 (12/02 2009) Pulse Rate: 70 (12/02 2009)  Labs: Recent Labs    07/31/19 0817  08/01/19 0003 08/01/19 0519 08/01/19 0842  08/02/19 0620 08/02/19 1300 08/02/19 2154  HGB 8.4*  --  7.1*  --  7.4*  --  7.7*  --   --   HCT 26.9*  --  23.1*  --   --   --  24.6*  --   --   PLT 266  --  221  --   --   --  244  --   --   APTT 37*  --   --  99*  --   --   --   --   --   LABPROT 14.1  --   --  15.6*  --   --   --   --   --   INR 1.1  --   --  1.3*  --   --   --   --   --   HEPARINUNFRC  --    < > 0.24*  --  0.51   < > 0.55 0.88* 0.33  CREATININE 7.36*  --   --  7.21*  --   --  7.42*  --   --    < > = values in this interval not displayed.    Estimated Creatinine Clearance: 11.9 mL/min (A) (by C-G formula based on SCr of 7.42 mg/dL (H)).   Medical History: Past Medical History:  Diagnosis Date  . Acute respiratory failure with hypoxia ([place]) 09/17/2018  . Arthritis    "left arm; right leg" (12/13/2014)  . Asthma   . CHF (congestive heart failure) ([place])   . Chronic disease anemia    [name] 12/13/2014  . Chronic kidney disease (CKD), stage IV (severe) ([place])    [name] 12/13/2014... on dialysis  . Complication of anesthesia    unable to urinate after CAPD urgery  . Continuous ambulatory peritoneal dialysis status ([place])   . Coronary artery disease    [name] 12/13/2014  . Depression   . Dysrhythmia    patient unaware of irregular heartbeat  . GERD (gastroesophageal reflux disease)   . High cholesterol    [name] 12/13/2014  . Hypertension   . Non-Q wave myocardial infarction ([place])    [name] 12/13/2014  . PVD (peripheral vascular disease)  ([place])    [name] 12/13/2014  . Type II diabetes mellitus (HCC)     Medications:  No PTA anticoagulant meds per record  Assessment: KEENE GILKEY is a 58 y.o. male with a history of CHF, CKD, GERD, hypertension, PAD who comes the ED complaining of worsening of his chronic right leg pain which is below the knee and all the way down to the foot.  Over the last 2 days it has rapidly worsened and is now intractable, unbearable.  Pharmacy has been consulted to initiate/monitor a full-dose heparin drip for ischemic leg.  12/1 @ 0842: HL 0.51. Level therapeutic x1.  12/1 @ 2254 HL: 0.87. Level is supratherapeutic  12/2 @ 0620 HL: 0.55 Therapeutic  12/2 @ 1300 HL: 0.88 Supratherapeutic   Goal of Therapy:  Heparin level 0.3-0.7 units/ml  Monitor platelets by anticoagulation protocol: Yes   Plan:  12/2 1300 HL 0.88 supratherapeutic. Spoke with RN. No issues. Will decrease heparin drip to 1300 units/hr. HL ordered for 2200.  Daily CBC's while on heparin drip.  12/2:  HL @ 2200 = 0.33 Will continue pt on current rate and recheck HL in 8 hrs on 12/3 @ 0600.   Orene Desanctis, PharmD Clinical Pharmacist 08/02/2019 10:25 PM

## 2019-08-02 NOTE — Progress Notes (Signed)
Pharmacy Antibiotic Note  Scott [name] is a 58 y.o. male admitted on 07/31/2019 with osteomyelitis. He has ESRD for which he undergoes peritoneal dialysis and receives this treatment overnight. 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 He underwent a successful recanalization of the left lower extremity for limb salvage performed by Dr [name] on 11/17. On 07/19/2019 he was taken for surgery by podiatry and underwent I&D of the infected bone and soft tissue of the left first metatarsal and amputation of left second toe with partial ray resection.  Wound cultures were sent from both and grew Achromobacter spp (started on TMP/SMZ as outpatient) and MRSE.  He was receiving ceftazidime and vancomycin via peritoneal dialysis.    11/30 @ 1540 Vancomycin random  = 28 mcg/mL 12/2 @ 0620 Vancomycin random = 25 mcg/mL  Plan: 1.  Hold vancomycin and check random level 12/4 am 2. Continue bactrim 1 SS tab daily and ceftazidime 1gm IV q24h  Height: 5\' 9"  (175.3 cm) Weight: 202 lb 9.6 oz (91.9 kg) IBW/kg (Calculated) : 70.7  Temp (24hrs), Avg:97.8 F (36.6 C), Min:97.5 F (36.4 C), Max:98.6 F (37 C)  Recent Labs  Lab 07/31/19 0817 07/31/19 1540 08/01/19 0003 08/01/19 0519 08/02/19 0620  WBC 11.7*  --  9.6  --  9.7  CREATININE 7.36*  --   --  7.21* 7.42*  VANCORANDOM  --  28  --   --  25    Estimated Creatinine Clearance: 11.9 mL/min (A) (by C-G formula based on SCr of 7.42 mg/dL (H)).    Antimicrobials this admission: 11/16 ceftriaxone >> 11/19 11/16 vancomycin >>  11/20 cefepime >> 11/30 SMX/TMP (patient reported not taking to med rec) >>  Microbiology results: 11/13 SARS CoV-2: negative  11/18 WCx: Ahcromobacter (TMP/SMZ susc), MRSE.   11/17 MRSA PCR: negative  Thank you for allowing pharmacy to be a part of this patient's care.  [name]  Resident 08/02/2019 8:28 AM

## 2019-08-02 NOTE — Progress Notes (Signed)
Date of Admission:  07/31/2019       Subjective: Doing ok Says his foot hurts less if he covered it with a sock and sat up  Medications:  . aspirin EC  81 mg Oral Daily  . atorvastatin  40 mg Oral Daily  . calcitRIOL  0.25 mcg Oral Daily  . calcium acetate  2,001 mg Oral TID WC  . carvedilol  25 mg Oral BID WC  . clopidogrel  75 mg Oral Q breakfast  . gabapentin  300 mg Oral QHS  . gentamicin cream  1 application Topical Daily  . hydrALAZINE  25 mg Oral TID  . insulin aspart  0-5 Units Subcutaneous QHS  . insulin aspart  0-9 Units Subcutaneous TID WC  . insulin aspart  2 Units Subcutaneous TID WC  . isosorbide mononitrate  30 mg Oral Daily  . pantoprazole  40 mg Oral Daily  . sodium chloride flush  3 mL Intravenous Q12H  . sulfamethoxazole-trimethoprim  1 tablet Oral Daily  . tamsulosin  0.4 mg Oral Daily  . vancomycin variable dose per unstable renal function (pharmacist dosing)   Does not apply See admin instructions    Objective: Vital signs in last 24 hours: Temp:  [97.5 F (36.4 C)-98.6 F (37 C)] 97.9 F (36.6 C) (12/02 0725) Pulse Rate:  [56-97] 75 (12/02 0725) Resp:  [11-19] 19 (12/02 0725) BP: (87-124)/(34-92) 108/44 (12/02 0725) SpO2:  [93 %-100 %] 97 % (12/02 0725) Weight:  [91.9 kg] 91.9 kg (12/02 0424)  PHYSICAL EXAM:  General: Alert, cooperative, no distress, appears stated age.  Lungs: Clear to auscultation bilaterally. No Wheezing or Rhonchi. No rales. Heart: Regular rate and rhythm, no murmur, rub or gallop. Extremities: dressing not removed today        Skin: No rashes or lesions. Or bruising Lymph: Cervical, supraclavicular normal. Neurologic: Grossly non-focal  Lab Results Recent Labs    08/01/19 0003 08/01/19 0519 08/01/19 0842 08/02/19 0620  WBC 9.6  --   --  9.7  HGB 7.1*  --  7.4* 7.7*  HCT 23.1*  --   --  24.6*  NA  --  138  --  138  K  --  4.4  --  4.2  CL  --  103  --  102  CO2  --  25  --  25  BUN  --  59*  --  55*   CREATININE  --  7.21*  --  7.42*   Liver Panel Recent Labs    07/31/19 0817 08/01/19 0519  PROT 6.4* 5.5*  ALBUMIN 2.6* 2.2*  AST 23 14*  ALT 19 14  ALKPHOS 110 91  BILITOT 0.8 0.7   Sedimentation Rate No results for input(s): ESRSEDRATE in the last 72 hours. C-Reactive Protein No results for input(s): CRP in the last 72 hours.  Microbiology:  Studies/Results: No results found.   Assessment/Plan:   PAD   Rt great toe gangrene and diminished circulation rt foot. As per vascular sluggish PT, no AT or peronela flow. Plan for repeat angio on Friday using pedal approach for limb salvage  Left toe gangrene s/p angioplasty and stents / partial ray great toe amputation in September 2020 followed by nonhealing wound and further debridement of the metatarsal and second ray excision on 07/19/2019.  Surgical culture from 07/19/2019 had Achromobacter  species and staph epidermidis. The previous superficial cultures had Pseudomonas and Citrobacter. Currently on vancomycin and ceftazidime and p.o. Bactrim.  As there was  residual osteomyelitis the plan is to treat him with at least 4 weeks of antibiotics end date 08/18/19   ESRD- on PD  DM: management as per primary team  CAD- h/o stent  HLD on atorvastatin  Discussed the management with patient

## 2019-08-02 NOTE — Progress Notes (Signed)
Progress Note    Scott [name]  BSJ:[id number] DOB: 1961-08-12  DOA: 07/31/2019 PCP: [facility] [employer]      Brief Narrative:    Medical records reviewed and are as summarized below:  Scott [name] is an 58 y.o. male with medical history significant of peripheral arterial disease, diabetes mellitus type 2, ESRD on peritoneal dialysis presents to the hospital with complaints of right lower extremity pain.  Patient was admitted here last week for left lower extremity pain diagnosed with osteomyelitis requiring angiogram by vascular followed by  amputation of left second toe by podiatry.  He was eventually discharged on IV antibiotics.  He had been feeling better but started experiencing right lower extremity pain over the last couple of days.  Initially pain was with exertion but now has progressed to persistent and his toes have darkened.      Assessment/Plan:   Principal Problem:   Leg pain, right Active Problems:   Type 2 diabetes mellitus with other diabetic kidney complication (HCC)   GERD (gastroesophageal reflux disease)   ESRD (end stage renal disease) (HCC)   Tobacco abuse   Anemia in ESRD (end-stage renal disease) (HCC)   Gastro-esophageal reflux disease with esophagitis   Type II diabetes mellitus (HCC)   PVD (peripheral vascular disease) (HCC)   PAD (peripheral artery disease) (HCC)   Osteomyelitis of second toe of left foot (HCC)   Body mass index is 29.92 kg/m.   Severe peripheral vascular disease with right big toe gangrene: Continue IV heparin infusion and monitor PTT per protocol.  Analgesics as needed for pain.  Underwent angioplasty of the right profunda femoris artery. Patient continues to have poor proximal tibial pulses and will require repeat procedure this Friday to improve perfusion.  Borderline low blood pressure: Asymptomatic.  Monitor BP closely  Anemia of chronic disease: Monitor H&H and transfuse as needed.  ESRD on  peritoneal dialysis: Follow-up with nephrologist for management.  Type 2 diabetes mellitus: NovoLog as needed for hyperglycemia  CAD/chronic systolic CHF with EF of 35 to 40% in January 2020: Continue fluid management with peritoneal dialysis.  Continue Coreg if BP allows.  Continue aspirin and Lipitor  Recent osteomyelitis of left foot: Continue antibiotics (ceftazidime, vancomycin and Bactrim)   Family Communication/Anticipated D/C date and plan/Code Status   DVT prophylaxis: IV heparin infusion Code Status: Full code Family Communication: Plan discussed with the patient Disposition Plan: Unknown at this time      Subjective:  Patient sitting in the chair.  Denies any acute complaint no nausea no vomiting no fever no chills.  No chest pain abdominal pain. Pain in the leg is well controlled for now.  Objective:    Vitals:   08/02/19 0424 08/02/19 0633 08/02/19 0725 08/02/19 1630  BP: (!) 106/39 (!) 122/37 (!) 108/44 (!) 109/44  Pulse: 73 73 75 75  Resp: 16 16 19 19   Temp: 98.6 F (37 C) 97.9 F (36.6 C) 97.9 F (36.6 C) 97.7 F (36.5 C)  TempSrc: Oral Oral Oral Oral  SpO2: 96% 93% 97% 100%  Weight: 91.9 kg     Height:        Intake/Output Summary (Last 24 hours) at 08/02/2019 1912 Last data filed at 08/02/2019 1834 Gross per 24 hour  Intake 483 ml  Output 250 ml  Net 233 ml   Filed Weights   07/31/19 2030 08/01/19 0549 08/02/19 0424  Weight: 96.3 kg 94.5 kg 91.9 kg    Exam:  GEN: NAD SKIN: No rash EYES: EOMI ENT: MMM CV: RRR PULM: CTA B ABD: soft, obese, NT, +BS,  PD catheter in the left side of his abdomen CNS: AAO x 3, non focal EXT: right gangrenous big toe. Left foot surgical wound with sutures covered with dressing    Data Reviewed:   I have personally reviewed following labs and imaging studies:  Labs: Labs show the following:   Basic Metabolic Panel: Recent Labs  Lab 07/31/19 0817 08/01/19 0519 08/02/19 0620  NA 136 138 138  K  4.6 4.4 4.2  CL 99 103 102  CO2 23 25 25   GLUCOSE 216* 128* 126*  BUN 60* 59* 55*  CREATININE 7.36* 7.21* 7.42*  CALCIUM 7.5* 7.8* 8.0*   GFR Estimated Creatinine Clearance: 11.9 mL/min (A) (by C-G formula based on SCr of 7.42 mg/dL (H)). Liver Function Tests: Recent Labs  Lab 07/31/19 0817 08/01/19 0519  AST 23 14*  ALT 19 14  ALKPHOS 110 91  BILITOT 0.8 0.7  PROT 6.4* 5.5*  ALBUMIN 2.6* 2.2*   No results for input(s): LIPASE, AMYLASE in the last 168 hours. No results for input(s): AMMONIA in the last 168 hours. Coagulation profile Recent Labs  Lab 07/31/19 0817 08/01/19 0519  INR 1.1 1.3*    CBC: Recent Labs  Lab 07/31/19 0817 08/01/19 0003 08/01/19 0842 08/02/19 0620  WBC 11.7* 9.6  --  9.7  NEUTROABS  --   --   --  7.4  HGB 8.4* 7.1* 7.4* 7.7*  HCT 26.9* 23.1*  --  24.6*  MCV 98.2 97.9  --  98.0  PLT 266 221  --  244   Cardiac Enzymes: No results for input(s): CKTOTAL, CKMB, CKMBINDEX, TROPONINI in the last 168 hours. BNP (last 3 results) No results for input(s): PROBNP in the last 8760 hours. CBG: Recent Labs  Lab 08/01/19 1720 08/01/19 2103 08/02/19 0726 08/02/19 1130 08/02/19 1632  GLUCAP 122* 151* 112* 99 112*   D-Dimer: No results for input(s): DDIMER in the last 72 hours. Hgb A1c: No results for input(s): HGBA1C in the last 72 hours. Lipid Profile: No results for input(s): CHOL, HDL, LDLCALC, TRIG, CHOLHDL, LDLDIRECT in the last 72 hours. Thyroid function studies: No results for input(s): TSH, T4TOTAL, T3FREE, THYROIDAB in the last 72 hours.  Invalid input(s): FREET3 Anemia work up: No results for input(s): VITAMINB12, FOLATE, FERRITIN, TIBC, IRON, RETICCTPCT in the last 72 hours. Sepsis Labs: Recent Labs  Lab 07/31/19 0817 08/01/19 0003 08/02/19 0620  WBC 11.7* 9.6 9.7    Microbiology Recent Results (from the past 240 hour(s))  SARS CORONAVIRUS 2 (TAT 6-24 HRS) Nasopharyngeal Nasopharyngeal Swab     Status: None    Collection Time: 07/28/19 11:24 AM   Specimen: Nasopharyngeal Swab  Result Value Ref Range Status   SARS Coronavirus 2 NEGATIVE NEGATIVE Final    Comment: (NOTE) SARS-CoV-2 target nucleic acids are NOT DETECTED. The SARS-CoV-2 RNA is generally detectable in upper and lower respiratory specimens during the acute phase of infection. Negative results do not preclude SARS-CoV-2 infection, do not rule out co-infections with other pathogens, and should not be used as the sole basis for treatment or other patient management decisions. Negative results must be combined with clinical observations, patient history, and epidemiological information. The expected result is Negative. Fact Sheet for Patients: SugarRoll.be Fact Sheet for Healthcare Providers: https://www.woods-mathews.com/ This test is not yet approved or cleared by the [place] FDA and  has been authorized for detection and/or diagnosis  of SARS-CoV-2 by FDA under an Emergency Use Authorization (EUA). This EUA will remain  in effect (meaning this test can be used) for the duration of the COVID-19 declaration under Section 56 4(b)(1) of the Act, 21 U.S.C. section 360bbb-3(b)(1), unless the authorization is terminated or revoked sooner. Performed at [facility], [place] [address] [place], [place] [address]     Procedures and diagnostic studies:  No results found.  Medications:   . aspirin EC  81 mg Oral Daily  . atorvastatin  40 mg Oral Daily  . calcitRIOL  0.25 mcg Oral Daily  . calcium acetate  2,001 mg Oral TID WC  . carvedilol  25 mg Oral BID WC  . clopidogrel  75 mg Oral Q breakfast  . gabapentin  300 mg Oral QHS  . gentamicin cream  1 application Topical Daily  . hydrALAZINE  25 mg Oral TID  . insulin aspart  0-5 Units Subcutaneous QHS  . insulin aspart  0-9 Units Subcutaneous TID WC  . insulin aspart  2 Units Subcutaneous TID WC  . isosorbide mononitrate  30 mg  Oral Daily  . pantoprazole  40 mg Oral Daily  . polyethylene glycol  17 g Oral Daily  . senna-docusate  2 tablet Oral BID  . sodium chloride flush  3 mL Intravenous Q12H  . sulfamethoxazole-trimethoprim  1 tablet Oral Daily  . tamsulosin  0.4 mg Oral Daily  . vancomycin variable dose per unstable renal function (pharmacist dosing)   Does not apply See admin instructions   Continuous Infusions: . sodium chloride    . cefTAZidime (FORTAZ)  IV 1 g (08/02/19 1742)  . [START ON 08/03/2019] dialysis solution 1.5% low-MG/low-CA    . heparin 1,300 Units/hr (08/02/19 1400)     LOS: 2 days   Berle Mull  [facility]   *Please refer to [email or web address], password [email or web address] to get updated schedule on who will round on this patient, as hospitalists switch teams weekly. If 7PM-7AM, please contact night-coverage at [email or web address], password [email or web address] for any overnight needs.  08/02/2019, 7:12 PM

## 2019-08-02 NOTE — Consult Note (Signed)
ANTICOAGULATION CONSULT NOTE  Pharmacy Consult for Heparin Drip Indication: Ischemic Leg  No Known Allergies  Patient Measurements: Height: 5\' 9"  (175.3 cm) Weight: 202 lb 9.6 oz (91.9 kg) IBW/kg (Calculated) : 70.7 Heparin Dosing Weight: 91.3 kg  Vital Signs: Temp: 97.9 F (36.6 C) (12/02 0633) Temp Source: Oral (12/02 5409) BP: 122/37 (12/02 8119) Pulse Rate: 73 (12/02 0633)  Labs: Recent Labs    07/31/19 0817  08/01/19 0003 08/01/19 0519 08/01/19 0842 08/01/19 2254 08/02/19 0620  HGB 8.4*  --  7.1*  --  7.4*  --  7.7*  HCT 26.9*  --  23.1*  --   --   --  24.6*  PLT 266  --  221  --   --   --  244  APTT 37*  --   --  99*  --   --   --   LABPROT 14.1  --   --  15.6*  --   --   --   INR 1.1  --   --  1.3*  --   --   --   HEPARINUNFRC  --    < > 0.24*  --  0.51 0.87* 0.55  CREATININE 7.36*  --   --  7.21*  --   --  7.42*   < > = values in this interval not displayed.    Estimated Creatinine Clearance: 11.9 mL/min (A) (by C-G formula based on SCr of 7.42 mg/dL (H)).   Medical History: Past Medical History:  Diagnosis Date  . Acute respiratory failure with hypoxia ([place]) 09/17/2018  . Arthritis    "left arm; right leg" (12/13/2014)  . Asthma   . CHF (congestive heart failure) ([place])   . Chronic disease anemia    [name] 12/13/2014  . Chronic kidney disease (CKD), stage IV (severe) ([place])    [name] 12/13/2014... on dialysis  . Complication of anesthesia    unable to urinate after CAPD urgery  . Continuous ambulatory peritoneal dialysis status ([place])   . Coronary artery disease    [name] 12/13/2014  . Depression   . Dysrhythmia    patient unaware of irregular heartbeat  . GERD (gastroesophageal reflux disease)   . High cholesterol    [name] 12/13/2014  . Hypertension   . Non-Q wave myocardial infarction ([place])    [name] 12/13/2014  . PVD (peripheral vascular disease) ([place])    [name] 12/13/2014  . Type II diabetes mellitus (HCC)     Medications:  No PTA  anticoagulant meds per record  Assessment: Scott [name] is a 58 y.o. male with a history of CHF, CKD, GERD, hypertension, PAD who comes the ED complaining of worsening of his chronic right leg pain which is below the knee and all the way down to the foot.  Over the last 2 days it has rapidly worsened and is now intractable, unbearable.  Pharmacy has been consulted to initiate/monitor a full-dose heparin drip for ischemic leg.  12/1 @ 0842: HL 0.51. Level therapeutic x1.  12/1 @ 2254 HL: 0.87. Level is supertherapeutic.  12/2 @ 0620 HL: 0.55 Therapeutic    Goal of Therapy:  Heparin level 0.3-0.7 units/ml Monitor platelets by anticoagulation protocol: Yes   Plan:  12/2 @ 0.55 Level is therapeutic. Will continue current infusion rate of 1450 units/hr.   Will recheck confirmatory HL in 6 hours.    Daily CBC's while on heparin drip.  Pernell Dupre, PharmD, BCPS Clinical Pharmacist 08/02/2019 6:52 AM

## 2019-08-02 NOTE — Progress Notes (Signed)
[facility]  ROUNDING NOTE   Subjective:   Patient fell this morning. Working with PT when Scott [name].   Angiogram yesterday with balloon angioplasty to right lower extremity by Dr. Delana [name]  Objective:  Vital signs in last 24 hours:  Temp:  [97.5 F (36.4 C)-98.6 F (37 C)] 97.9 F (36.6 C) (12/02 0725) Pulse Rate:  [56-97] 75 (12/02 0725) Resp:  [11-19] 19 (12/02 0725) BP: (92-124)/(34-92) 108/44 (12/02 0725) SpO2:  [93 %-100 %] 97 % (12/02 0725) Weight:  [91.9 kg] 91.9 kg (12/02 0424)  Weight change: -6.078 kg Filed Weights   07/31/19 2030 08/01/19 0549 08/02/19 0424  Weight: 96.3 kg 94.5 kg 91.9 kg    Intake/Output: I/O last 3 completed shifts: In: 777.9 [P.O.:240; I.V.:438.9; IV Piggyback:99] Out: 2706 [Urine:75; Other:2631]   Intake/Output this shift:  Total I/O In: 483 [P.O.:480; I.V.:3] Out: 0   Physical Exam: General: NAD, sitting on bed  Head: Normocephalic, atraumatic. Moist oral mucosal membranes  Eyes: Anicteric, PERRL  Neck: Supple, trachea midline  Lungs:  Clear to auscultation  Heart: Regular rate and rhythm  Abdomen:  Soft, nontender,   Extremities:  Left foot in clean and dry dressings Right foot +erythema and +edema  Neurologic: Nonfocal, moving all four extremities  Skin: No lesions  Access: PD catheter    Basic Metabolic Panel: Recent Labs  Lab 07/31/19 0817 08/01/19 0519 08/02/19 0620  NA 136 138 138  K 4.6 4.4 4.2  CL 99 103 102  CO2 23 25 25   GLUCOSE 216* 128* 126*  BUN 60* 59* 55*  CREATININE 7.36* 7.21* 7.42*  CALCIUM 7.5* 7.8* 8.0*    Liver Function Tests: Recent Labs  Lab 07/31/19 0817 08/01/19 0519  AST 23 14*  ALT 19 14  ALKPHOS 110 91  BILITOT 0.8 0.7  PROT 6.4* 5.5*  ALBUMIN 2.6* 2.2*   No results for input(s): LIPASE, AMYLASE in the last 168 hours. No results for input(s): AMMONIA in the last 168 hours.  CBC: Recent Labs  Lab 07/31/19 0817 08/01/19 0003 08/01/19 0842 08/02/19 0620   WBC 11.7* 9.6  --  9.7  NEUTROABS  --   --   --  7.4  HGB 8.4* 7.1* 7.4* 7.7*  HCT 26.9* 23.1*  --  24.6*  MCV 98.2 97.9  --  98.0  PLT 266 221  --  244    Cardiac Enzymes: No results for input(s): CKTOTAL, CKMB, CKMBINDEX, TROPONINI in the last 168 hours.  BNP: Invalid input(s): POCBNP  CBG: Recent Labs  Lab 08/01/19 1625 08/01/19 1720 08/01/19 2103 08/02/19 0726 08/02/19 1130  GLUCAP 112* 122* 151* 112* 99    Microbiology: Results for orders placed or performed during the hospital encounter of 07/28/19  SARS CORONAVIRUS 2 (TAT 6-24 HRS) Nasopharyngeal Nasopharyngeal Swab     Status: None   Collection Time: 07/28/19 11:24 AM   Specimen: Nasopharyngeal Swab  Result Value Ref Range Status   SARS Coronavirus 2 NEGATIVE NEGATIVE Final    Comment: (NOTE) SARS-CoV-2 target nucleic acids are NOT DETECTED. The SARS-CoV-2 RNA is generally detectable in upper and lower respiratory specimens during the acute phase of infection. Negative results do not preclude SARS-CoV-2 infection, do not rule out co-infections with other pathogens, and should not be used as the sole basis for treatment or other patient management decisions. Negative results must be combined with clinical observations, patient history, and epidemiological information. The expected result is Negative. Fact Sheet for Patients: SugarRoll.be Fact Sheet for Healthcare Providers: https://www.woods-mathews.com/  This test is not yet approved or cleared by the [place] and  has been authorized for detection and/or diagnosis of SARS-CoV-2 by FDA under an Emergency Use Authorization (EUA). This EUA will remain  in effect (meaning this test can be used) for the duration of the COVID-19 declaration under Section 56 4(b)(1) of the Act, 21 U.S.C. section 360bbb-3(b)(1), unless the authorization is terminated or revoked sooner. Performed at [facility], [place] [address], [place], [place] [address]     Coagulation Studies: Recent Labs    07/31/19 0817 08/01/19 0519  LABPROT 14.1 15.6*  INR 1.1 1.3*    Urinalysis: No results for input(s): COLORURINE, LABSPEC, PHURINE, GLUCOSEU, HGBUR, BILIRUBINUR, KETONESUR, PROTEINUR, UROBILINOGEN, NITRITE, LEUKOCYTESUR in the last 72 hours.  Invalid input(s): APPERANCEUR    Imaging: No results found.   Medications:   . sodium chloride    . cefTAZidime (FORTAZ)  IV Stopped (07/31/19 1633)  . dialysis solution 1.5% low-MG/low-CA    . dialysis solution 2.5% low-MG/low-CA    . heparin 1,450 Units/hr (08/02/19 1050)   . aspirin EC  81 mg Oral Daily  . atorvastatin  40 mg Oral Daily  . calcitRIOL  0.25 mcg Oral Daily  . calcium acetate  2,001 mg Oral TID WC  . carvedilol  25 mg Oral BID WC  . clopidogrel  75 mg Oral Q breakfast  . gabapentin  300 mg Oral QHS  . gentamicin cream  1 application Topical Daily  . hydrALAZINE  25 mg Oral TID  . insulin aspart  0-5 Units Subcutaneous QHS  . insulin aspart  0-9 Units Subcutaneous TID WC  . insulin aspart  2 Units Subcutaneous TID WC  . isosorbide mononitrate  30 mg Oral Daily  . pantoprazole  40 mg Oral Daily  . polyethylene glycol  17 g Oral Daily  . senna-docusate  2 tablet Oral BID  . sodium chloride flush  3 mL Intravenous Q12H  . sulfamethoxazole-trimethoprim  1 tablet Oral Daily  . tamsulosin  0.4 mg Oral Daily  . vancomycin variable dose per unstable renal function (pharmacist dosing)   Does not apply See admin instructions   sodium chloride, acetaminophen **OR** acetaminophen, acetaminophen, albuterol, bisacodyl, calcium acetate, HYDROcodone-acetaminophen, morphine injection, ondansetron **OR** ondansetron (ZOFRAN) IV, sodium chloride flush, traMADol  Assessment/ Plan:  [name] is a 58 y.o. white male with end stage renal disease on peritoneal dialysis, hypertension, diabetes mellitus type II, peripheral vascular disease, left toe  amputation, who is [facility] to [facility] on 07/31/2019 for Right leg pain [M79.604] Dry gangrene ([place]) [I96] Ischemic leg pain [M79.606, I99.8]  [place] PD CAPD 4 exchanges 2048mL fills  1. End Stage Renal Disease: peritoneal dialysis for tonight. CCPD 10 hours 4 exchanges 2 liter fills - 1.5% dextrose  2. Peripheral vascular disease: left lower extremity healing well. However now with erythema and ischemic changes to his right first toe.  Discharged on IP ceftazidime and vancomycin. He was administrating this at home.  - TMP/SMX, vanco, ceftazidime.  - Appreciate ID and vascular input.    3. Hypertension:  - carvedilol, hydralazine and isosorbide mononitrate  4. Anemia of chronic kidney disease: hemoglobin 7.7 - Aranesp as outpatient.   5. Secondary Hyperparathyroidism with hypocalcemia.   - calcitriol - calcium acetate with meals.    LOS: 2 Scott [name] 12/2/20201:50 PM

## 2019-08-02 NOTE — Progress Notes (Signed)
[facility] Daily Progress Note   Subjective: 1 Day Post-Op: 1. Introduction catheter into right lower extremity 3rd order catheter placement  2. Contrast injection right lower extremity for distal runoff   3. Percutaneous transluminal angioplasty right profunda femoris artery  4. Star close closure left common femoral arteriotomy  Patient with continued right lower extremity pain this AM. States extremity is weak when he tries to walk.   Objective: Vitals:   08/02/19 0227 08/02/19 0424 08/02/19 0633 08/02/19 0725  BP: (!) 123/52 (!) 106/39 (!) 122/37 (!) 108/44  Pulse: 74 73 73 75  Resp: 14 16 16 19   Temp: 97.7 F (36.5 C) 98.6 F (37 C) 97.9 F (36.6 C) 97.9 F (36.6 C)  TempSrc: Oral Oral Oral Oral  SpO2: 98% 96% 93% 97%  Weight:  91.9 kg    Height:        Intake/Output Summary (Last 24 hours) at 08/02/2019 1230 Last data filed at 08/02/2019 1031 Gross per 24 hour  Intake 619.59 ml  Output 75 ml  Net 544.59 ml   Physical Exam: A&Ox3, NAD CV: RRR Pulmonary: CTA Bilaterally Abdomen: Soft, Nontender, Nondistended Left Groin:  Access Site: clean, dry and intact.  Vascular:  Right Lower Extremity: Thigh soft.  Calf soft.  Extremity is warm distally to the foot.  Nonpalpable pedal pulses. The foot is slightly warmer when compared to yesterday however does remain cool.  Gangrenous first toe is stable.   Laboratory: CBC    Component Value Date/Time   WBC 9.7 08/02/2019 0620   HGB 7.7 (L) 08/02/2019 0620   HGB 10.7 (L) 12/13/2014 0102   HCT 24.6 (L) 08/02/2019 0620   HCT 33.8 (L) 12/13/2014 0102   PLT 244 08/02/2019 0620   PLT 510 (H) 12/13/2014 0102   BMET    Component Value Date/Time   NA 138 08/02/2019 0620   NA 140 04/16/2015   NA 140 12/13/2014 0102   K 4.2 08/02/2019 0620   K 4.6 12/13/2014 0102   CL 102 08/02/2019 0620   CL 111 12/13/2014 0102   CO2 25 08/02/2019 0620   CO2 23  12/13/2014 0102   GLUCOSE 126 (H) 08/02/2019 0620   GLUCOSE 213 (H) 12/13/2014 0102   BUN 55 (H) 08/02/2019 0620   BUN 44 (A) 04/16/2015   BUN 25 (H) 12/13/2014 0102   CREATININE 7.42 (H) 08/02/2019 0620   CREATININE 1.95 (H) 12/13/2014 0102   CALCIUM 8.0 (L) 08/02/2019 0620   CALCIUM 8.0 (L) 12/13/2014 0102   GFRNONAA 7 (L) 08/02/2019 0620   GFRNONAA 37 (L) 12/13/2014 0102   GFRAA 8 (L) 08/02/2019 0620   GFRAA 43 (L) 12/13/2014 0102   Assessment/Planning: The patient is a 58 year old male with multiple medical issues including known peripheral artery disease status post a right lower extremity angiogram with intervention POD#1 1) PAD: Patient with sluggish PT no AT or peroneal.  At high risk for limb loss.  Would like to repeat angiogram on Friday using pedal approach for limb salvage.  We will preop Thursday. Continue heparin gtt. 2) ESRD: Patient is currently maintained via peritoneal dialysis.  No issues with his dialysis catheter at this time.  Continue to dialyze per normal routine.  Discussed with Dr. Ellis Parents Jerrik Housholder PA-C 08/02/2019 12:30 PM

## 2019-08-02 NOTE — Evaluation (Signed)
Physical Therapy Evaluation Patient Details Name: Scott [name] MRN: [id number] DOB: 12-21-1960 Today's Date: 08/02/2019   History of Present Illness  Scott [name] is a 58yoM who comes to [facility] ED after severe pain in Right foot. Pt scheduled to have RLE vascular surgery in a couple days, but his pain has been recently very intense. Pt was admitted recently with Left foot toe amputations on 11/18. Pt underwent Rt leg stent placement on 12/1. Pt also sustained a fall to floor while donning pants from hospital bed, all without injury. PMH: CHF, CKD, CAD, PVD, HTN, DM, ESRD on PD overnight.  Clinical Impression  Pt admitted with above diagnosis. Pt currently with functional limitations due to the deficits listed below (see "PT Problem List"). Upon entry, pt in bed, awake and agreeable to participate. Pt rates pain as 5/10, but at end of session in recliner says it is improved. The pt is alert and oriented x4, pleasant, conversational, and generally a good historian. Pt denies having any HHPT after DC last week. Pt also reports not being able to acquire a kneeling scooter as planned. Pt performed bed mobility without physical assist, but extra effort required 2/2 weakness. Pt requires MaxA from EOB c RW reporting the sensation of falling forward requiring heavy BUE support on walker. From elevated surface pt still requires modA to rise, but more steady and tolerates standing x30sec. Pt sits at EOB x5 minutes thereafter, then becomes nauseated with multiple episodes of emesis. Pt assisted to recliner to sit up in a more safe capacity. Functional mobility assessment demonstrates increased effort/time requirements, poor tolerance, and need for physical assistance, whereas the patient performed these at a higher level of independence PTA. Pt still unable to come to standing with LLE NWB similar to last week, hence AMB deferred in homes to protect surgical site. Pt left up in chair and made comfortable at end of  session. Pt will benefit from skilled PT intervention to increase independence and safety with basic mobility in preparation for discharge to the venue listed below.       Follow Up Recommendations Home health PT;Supervision for mobility/OOB;Supervision - Intermittent    Equipment Recommendations  Wheelchair (measurements PT);Wheelchair cushion (measurements PT)(elevated leg rests)    Recommendations for Other Services       Precautions / Restrictions Precautions Precautions: Fall Restrictions LLE Weight Bearing: Non weight bearing Other Position/Activity Restrictions: Podiatry allowing for heel weightbearing as needed for mobility to BR.      Mobility  Bed Mobility Overal bed mobility: Needs Assistance Bed Mobility: Supine to Sit;Sit to Supine     Supine to sit: Supervision        Transfers Overall transfer level: Needs assistance Equipment used: Rolling walker (2 wheeled) Transfers: Sit to/from [facility] Sit to Stand: Max assist Stand pivot transfers: Min guard       General transfer comment: From elevated surface can perform with min-modA  Ambulation/Gait Ambulation/Gait assistance: (deferred; pt very weak, unable to maintain NWB on leftfoot)              Stairs            Wheelchair Mobility    Modified Rankin (Stroke Patients Only)       Balance Overall balance assessment: Needs assistance;History of Falls Sitting-balance support: No upper extremity supported;Feet supported Sitting balance-Leahy Scale: Good     Standing balance support: Bilateral upper extremity supported;During functional activity Standing balance-Leahy Scale: Fair  Pertinent Vitals/Pain Pain Assessment: 0-10 Pain Score: 5  Pain Location: Right foot/ankle Pain Descriptors / Indicators: Aching Pain Intervention(s): Limited activity within patient's tolerance    Home Living Family/patient expects to be  discharged to:: Private residence Living Arrangements: Spouse/significant other;Children(30yo son) Available Help at Discharge: Family;Available 24 hours/day Type of Home: Mobile home Home Access: Stairs to enter;Ramped entrance Entrance Stairs-Rails: Can reach both Entrance Stairs-Number of Steps: 3-4 Home Layout: One level Home Equipment: Grab bars - tub/shower;Cane - single point;Wheelchair - [employer] - 2 wheels      Prior Function Level of Independence: Needs assistance         Comments: pt states  multiple falls in the last year with 1-2 the last 6 months, pt thinks cause of falls was tripping over something     Hand Dominance        Extremity/Trunk Assessment   Upper Extremity Assessment Upper Extremity Assessment: Overall WFL for tasks assessed;Generalized weakness    Lower Extremity Assessment Lower Extremity Assessment: Generalized weakness       Communication   Communication: No difficulties  Cognition Arousal/Alertness: Awake/alert Behavior During Therapy: WFL for tasks assessed/performed Overall Cognitive Status: Within Functional Limits for tasks assessed                                        General Comments      Exercises     Assessment/Plan    PT Assessment Patient needs continued PT services  PT Problem List Decreased strength;Decreased activity tolerance;Decreased mobility;Decreased balance       PT Treatment Interventions DME instruction;Gait training;Balance training;Functional mobility training;Therapeutic activities;Therapeutic exercise;Stair training;Patient/family education    PT Goals (Current goals can be found in the Care Plan section)  Acute Rehab PT Goals Patient Stated Goal: be able to sit up at EOB ad lib. PT Goal Formulation: With patient Time For Goal Achievement: 08/16/19 Potential to Achieve Goals: Fair    Frequency Min 2X/week   Barriers to discharge        Co-evaluation                AM-PAC PT "6 Clicks" Mobility  Outcome Measure Help needed turning from your back to your side while in a flat bed without using bedrails?: None Help needed moving from lying on your back to sitting on the side of a flat bed without using bedrails?: None Help needed moving to and from a bed to a chair (including a wheelchair)?: A Lot Help needed standing up from a chair using your arms (e.g., wheelchair or bedside chair)?: A Lot Help needed to walk in hospital room?: Total   6 Click Score: 13    End of Session Equipment Utilized During Treatment: Gait belt Activity Tolerance: No increased pain;Patient limited by fatigue;Treatment limited secondary to medical complications (Comment)(nauseated c emesis after sitting EOB for a bit) Patient left: in bed;with nursing/sitter in room;with call bell/phone within reach Nurse Communication: Mobility status PT Visit Diagnosis: Unsteadiness on feet (R26.81);Muscle weakness (generalized) (M62.81);Difficulty in walking, not elsewhere classified (R26.2);Other abnormalities of gait and mobility (R26.89)    Time: 7628-3151 PT Time Calculation (min) (ACUTE ONLY): 30 min   Charges:   PT Evaluation $PT Eval High Complexity: 1 High         12:44 PM, 08/02/19 Etta Grandchild, PT, DPT Physical Therapist - [facility]  [phone] [facility])  , C 08/02/2019, 12:38 PM

## 2019-08-03 ENCOUNTER — Other Ambulatory Visit (INDEPENDENT_AMBULATORY_CARE_PROVIDER_SITE_OTHER): Payer: Self-pay | Admitting: Vascular Surgery

## 2019-08-03 LAB — CBC
HCT: 24 % — ABNORMAL LOW (ref 39.0–52.0)
Hemoglobin: 7.7 g/dL — ABNORMAL LOW (ref 13.0–17.0)
MCH: 30.4 pg (ref 26.0–34.0)
MCHC: 32.1 g/dL (ref 30.0–36.0)
MCV: 94.9 fL (ref 80.0–100.0)
Platelets: 255 10*3/uL (ref 150–400)
RBC: 2.53 MIL/uL — ABNORMAL LOW (ref 4.22–5.81)
RDW: 16.2 % — ABNORMAL HIGH (ref 11.5–15.5)
WBC: 9.8 10*3/uL (ref 4.0–10.5)
nRBC: 0 % (ref 0.0–0.2)

## 2019-08-03 LAB — BASIC METABOLIC PANEL
Anion gap: 12 (ref 5–15)
BUN: 49 mg/dL — ABNORMAL HIGH (ref 6–20)
CO2: 25 mmol/L (ref 22–32)
Calcium: 8.3 mg/dL — ABNORMAL LOW (ref 8.9–10.3)
Chloride: 102 mmol/L (ref 98–111)
Creatinine, Ser: 7.32 mg/dL — ABNORMAL HIGH (ref 0.61–1.24)
GFR calc Af Amer: 9 mL/min — ABNORMAL LOW (ref >60)
GFR calc non Af Amer: 7 mL/min — ABNORMAL LOW (ref >60)
Glucose, Bld: 121 mg/dL — ABNORMAL HIGH (ref 70–99)
Potassium: 4.4 mmol/L (ref 3.5–5.1)
Sodium: 139 mmol/L (ref 135–145)

## 2019-08-03 LAB — MAGNESIUM: Magnesium: 2.5 mg/dL — ABNORMAL HIGH (ref 1.7–2.4)

## 2019-08-03 LAB — HEPARIN LEVEL (UNFRACTIONATED): Heparin Unfractionated: 0.33 IU/mL (ref 0.30–0.70)

## 2019-08-03 LAB — GLUCOSE, CAPILLARY
Glucose-Capillary: 117 mg/dL — ABNORMAL HIGH (ref 70–99)
Glucose-Capillary: 122 mg/dL — ABNORMAL HIGH (ref 70–99)
Glucose-Capillary: 95 mg/dL (ref 70–99)
Glucose-Capillary: 109 mg/dL — ABNORMAL HIGH (ref 70–99)

## 2019-08-03 MED ORDER — METHOCARBAMOL 500 MG PO TABS
500.0000 mg | ORAL_TABLET | Freq: Three times a day (TID) | ORAL | Status: DC | PRN
  Administered 2019-08-03 – 2019-08-07 (×2): 500 mg via ORAL
  Filled 2019-08-03 (×4): qty 1

## 2019-08-03 MED ORDER — POLYETHYLENE GLYCOL 3350 17 G PO PACK
17.0000 g | PACK | Freq: Two times a day (BID) | ORAL | Status: DC
  Administered 2019-08-03 – 2019-08-12 (×12): 17 g via ORAL
  Filled 2019-08-03 (×14): qty 1

## 2019-08-03 MED ORDER — SODIUM CHLORIDE 0.9 % IV BOLUS
250.0000 mL | Freq: Once | INTRAVENOUS | Status: AC
  Administered 2019-08-03: 250 mL via INTRAVENOUS

## 2019-08-03 NOTE — Consult Note (Signed)
ANTICOAGULATION CONSULT NOTE  Pharmacy Consult for Heparin Drip Indication: Ischemic Leg  No Known Allergies  Patient Measurements: Height: 5\' 9"  (175.3 cm) Weight: 202 lb 9.6 oz (91.9 kg) IBW/kg (Calculated) : 70.7 Heparin Dosing Weight: 91.3 kg  Vital Signs: Temp: 97.6 F (36.4 C) (12/03 0454) Temp Source: Oral (12/03 0454) BP: 105/38 (12/03 0454) Pulse Rate: 67 (12/03 0454)  Labs: Recent Labs    07/31/19 0817  08/01/19 0003 08/01/19 0519  08/02/19 0620 08/02/19 1300 08/02/19 2154 08/03/19 0559  HGB 8.4*  --  7.1*  --    < > 7.7*  --   --  7.7*  HCT 26.9*  --  23.1*  --   --  24.6*  --   --  24.0*  PLT 266  --  221  --   --  244  --   --  255  APTT 37*  --   --  99*  --   --   --   --   --   LABPROT 14.1  --   --  15.6*  --   --   --   --   --   INR 1.1  --   --  1.3*  --   --   --   --   --   HEPARINUNFRC  --    < > 0.24*  --    < > 0.55 0.88* 0.33 0.33  CREATININE 7.36*  --   --  7.21*  --  7.42*  --   --   --    < > = values in this interval not displayed.    Estimated Creatinine Clearance: 11.9 mL/min (A) (by C-G formula based on SCr of 7.42 mg/dL (H)).   Medical History: Past Medical History:  Diagnosis Date  . Acute respiratory failure with hypoxia ([place]) 09/17/2018  . Arthritis    "left arm; right leg" (12/13/2014)  . Asthma   . CHF (congestive heart failure) ([place])   . Chronic disease anemia    [name] 12/13/2014  . Chronic kidney disease (CKD), stage IV (severe) ([place])    [name] 12/13/2014... on dialysis  . Complication of anesthesia    unable to urinate after CAPD urgery  . Continuous ambulatory peritoneal dialysis status ([place])   . Coronary artery disease    [name] 12/13/2014  . Depression   . Dysrhythmia    patient unaware of irregular heartbeat  . GERD (gastroesophageal reflux disease)   . High cholesterol    [name] 12/13/2014  . Hypertension   . Non-Q wave myocardial infarction ([place])    [name] 12/13/2014  . PVD (peripheral vascular disease)  ([place])    [name] 12/13/2014  . Type II diabetes mellitus ([facility])     Medications:  No PTA anticoagulant meds per record  Assessment: SYMIR MAH is a 58 y.o. male with a history of CHF, CKD, GERD, hypertension, PAD who comes the ED complaining of worsening of his chronic right leg pain which is below the knee and all the way down to the foot.  Over the last 2 days it has rapidly worsened and is now intractable, unbearable.  Pharmacy has been consulted to initiate/monitor a full-dose heparin drip for ischemic leg.  12/1 @ 0842: HL 0.51. Level therapeutic x1.  12/1 @ 2254 HL: 0.87. Level is supratherapeutic  12/2 @ 0620 HL: 0.55 Therapeutic  12/2 @ 1300 HL: 0.88 Supratherapeutic 12/2:  HL @ 2200 = 0.33   Goal of Therapy:  Heparin level 0.3-0.7 units/ml Monitor platelets by anticoagulation protocol: Yes   Plan:  12/3 @ 0559 HL: 0.33 Level therapeutic x2.   Continue current infusion rate of 1300 units/hr. Hgb 7.7- continue to monitor.    Will continue to check HL and CBCs daily while on heparin infusion.   Pernell Dupre, PharmD Clinical Pharmacist 08/03/2019 7:05 AM

## 2019-08-03 NOTE — Progress Notes (Signed)
[facility]  ROUNDING NOTE   Subjective:   Peritoneal dialysis treatment last night. Tolerated treatment well. UF of 863mL.   Objective:  Vital signs in last 24 hours:  Temp:  [97.6 F (36.4 C)-97.9 F (36.6 C)] 97.6 F (36.4 C) (12/03 0843) Pulse Rate:  [66-75] 66 (12/03 1119) Resp:  [18-19] 19 (12/03 0843) BP: (91-109)/(30-55) 93/41 (12/03 1119) SpO2:  [90 %-100 %] 90 % (12/03 0843)  Weight change:  Filed Weights   07/31/19 2030 08/01/19 0549 08/02/19 0424  Weight: 96.3 kg 94.5 kg 91.9 kg    Intake/Output: I/O last 3 completed shifts: In: 983.3 [P.O.:480; I.V.:403.3; IV Piggyback:100] Out: 250 [Urine:250]   Intake/Output this shift:  Total I/O In: -  Out: 847 [Other:847]  Physical Exam: General: NAD, sitting in chair  Head: Normocephalic, atraumatic. Moist oral mucosal membranes  Eyes: Anicteric, PERRL  Neck: Supple, trachea midline  Lungs:  Clear to auscultation  Heart: Regular rate and rhythm  Abdomen:  Soft, nontender,   Extremities:  Left foot in clean and dry dressings Right foot +erythema and +edema  Neurologic: Nonfocal, moving all four extremities  Skin: No lesions  Access: PD catheter    Basic Metabolic Panel: Recent Labs  Lab 07/31/19 0817 08/01/19 0519 08/02/19 0620 08/03/19 0559  NA 136 138 138 139  K 4.6 4.4 4.2 4.4  CL 99 103 102 102  CO2 23 25 25 25   GLUCOSE 216* 128* 126* 121*  BUN 60* 59* 55* 49*  CREATININE 7.36* 7.21* 7.42* 7.32*  CALCIUM 7.5* 7.8* 8.0* 8.3*  MG  --   --   --  2.5*    Liver Function Tests: Recent Labs  Lab 07/31/19 0817 08/01/19 0519  AST 23 14*  ALT 19 14  ALKPHOS 110 91  BILITOT 0.8 0.7  PROT 6.4* 5.5*  ALBUMIN 2.6* 2.2*   No results for input(s): LIPASE, AMYLASE in the last 168 hours. No results for input(s): AMMONIA in the last 168 hours.  CBC: Recent Labs  Lab 07/31/19 0817 08/01/19 0003 08/01/19 0842 08/02/19 0620 08/03/19 0559  WBC 11.7* 9.6  --  9.7 9.8  NEUTROABS  --    --   --  7.4  --   HGB 8.4* 7.1* 7.4* 7.7* 7.7*  HCT 26.9* 23.1*  --  24.6* 24.0*  MCV 98.2 97.9  --  98.0 94.9  PLT 266 221  --  244 255    Cardiac Enzymes: No results for input(s): CKTOTAL, CKMB, CKMBINDEX, TROPONINI in the last 168 hours.  BNP: Invalid input(s): POCBNP  CBG: Recent Labs  Lab 08/02/19 1130 08/02/19 1632 08/02/19 2015 08/03/19 0843 08/03/19 1156  GLUCAP 99 112* 129* 117* 122*    Microbiology: Results for orders placed or performed during the hospital encounter of 07/28/19  SARS CORONAVIRUS 2 (TAT 6-24 HRS) Nasopharyngeal Nasopharyngeal Swab     Status: None   Collection Time: 07/28/19 11:24 AM   Specimen: Nasopharyngeal Swab  Result Value Ref Range Status   SARS Coronavirus 2 NEGATIVE NEGATIVE Final    Comment: (NOTE) SARS-CoV-2 target nucleic acids are NOT DETECTED. The SARS-CoV-2 RNA is generally detectable in upper and lower respiratory specimens during the acute phase of infection. Negative results do not preclude SARS-CoV-2 infection, do not rule out co-infections with other pathogens, and should not be used as the sole basis for treatment or other patient management decisions. Negative results must be combined with clinical observations, patient history, and epidemiological information. The expected result is Negative. Fact  Sheet for Patients: SugarRoll.be Fact Sheet for Healthcare Providers: https://www.woods-mathews.com/ This test is not yet approved or cleared by the [place] FDA and  has been authorized for detection and/or diagnosis of SARS-CoV-2 by FDA under an Emergency Use Authorization (EUA). This EUA will remain  in effect (meaning this test can be used) for the duration of the COVID-19 declaration under Section 56 4(b)(1) of the Act, 21 U.S.C. section 360bbb-3(b)(1), unless the authorization is terminated or revoked sooner. Performed at [facility], [place] [address],  [place], [place] [address]     Coagulation Studies: Recent Labs    08/01/19 0519  LABPROT 15.6*  INR 1.3*    Urinalysis: No results for input(s): COLORURINE, LABSPEC, PHURINE, GLUCOSEU, HGBUR, BILIRUBINUR, KETONESUR, PROTEINUR, UROBILINOGEN, NITRITE, LEUKOCYTESUR in the last 72 hours.  Invalid input(s): APPERANCEUR    Imaging: No results found.   Medications:   . sodium chloride    . cefTAZidime (FORTAZ)  IV Stopped (08/02/19 1812)  . dialysis solution 1.5% low-MG/low-CA    . heparin 1,300 Units/hr (08/03/19 0818)   . aspirin EC  81 mg Oral Daily  . atorvastatin  40 mg Oral Daily  . calcitRIOL  0.25 mcg Oral Daily  . calcium acetate  2,001 mg Oral TID WC  . carvedilol  25 mg Oral BID WC  . clopidogrel  75 mg Oral Q breakfast  . gabapentin  300 mg Oral QHS  . gentamicin cream  1 application Topical Daily  . insulin aspart  0-5 Units Subcutaneous QHS  . insulin aspart  0-9 Units Subcutaneous TID WC  . insulin aspart  2 Units Subcutaneous TID WC  . pantoprazole  40 mg Oral Daily  . polyethylene glycol  17 g Oral BID  . senna-docusate  2 tablet Oral BID  . sodium chloride flush  3 mL Intravenous Q12H  . sulfamethoxazole-trimethoprim  1 tablet Oral Daily  . tamsulosin  0.4 mg Oral Daily  . vancomycin variable dose per unstable renal function (pharmacist dosing)   Does not apply See admin instructions   sodium chloride, acetaminophen **OR** acetaminophen, acetaminophen, albuterol, bisacodyl, calcium acetate, HYDROcodone-acetaminophen, methocarbamol, morphine injection, ondansetron **OR** ondansetron (ZOFRAN) IV, sodium chloride flush, traMADol  Assessment/ Plan:  [name] is a 58 y.o. white male with end stage renal disease on peritoneal dialysis, hypertension, diabetes mellitus type II, peripheral vascular disease, left toe amputation, who is admitted to [facility] on 07/31/2019 for Right leg pain [M79.604] Dry gangrene ([place]) [I96] Ischemic leg pain [M79.606,  I99.8]  [facility] PD CAPD 4 exchanges 2037mL fills  1. End Stage Renal Disease: peritoneal dialysis for tonight. CCPD 10 hours 4 exchanges 2 liter fills - 1.5% dextrose  2. Peripheral vascular disease: left lower extremity healing well. However now with erythema and ischemic changes to his right first toe.  Discharged on IP ceftazidime and vancomycin. He was administrating this at home.  - TMP/SMX, vanco, ceftazidime.  - Appreciate ID and vascular input.  Scheduled for repeat angiogram tomorrow.   3. Hypertension:  - carvedilol, hydralazine and isosorbide mononitrate  4. Anemia of chronic kidney disease: hemoglobin 7.7 - Aranesp as outpatient.   5. Secondary Hyperparathyroidism with hypocalcemia.   - calcitriol - calcium acetate with meals.    LOS: 3 Scott [name] 12/3/202012:06 PM

## 2019-08-03 NOTE — Progress Notes (Signed)
Progress Note    Scott [name]  UUV:[id number] DOB: 1961/04/01  DOA: 07/31/2019 PCP: [facility] [employer]      Brief Narrative:    Medical records reviewed and are as summarized below:  Scott [name] is an 58 y.o. male with medical history significant of peripheral arterial disease, diabetes mellitus type 2, ESRD on peritoneal dialysis presents to the hospital with complaints of right lower extremity pain.  Patient was admitted here last week for left lower extremity pain diagnosed with osteomyelitis requiring angiogram by vascular followed by  amputation of left second toe by podiatry.  He was eventually discharged on IV antibiotics.  He had been feeling better but started experiencing right lower extremity pain over the last couple of days.  Initially pain was with exertion but now has progressed to persistent and his toes have darkened.      Assessment/Plan:   Principal Problem:   Leg pain, right Active Problems:   Type 2 diabetes mellitus with other diabetic kidney complication (HCC)   GERD (gastroesophageal reflux disease)   ESRD (end stage renal disease) (HCC)   Tobacco abuse   Anemia in ESRD (end-stage renal disease) (HCC)   Gastro-esophageal reflux disease with esophagitis   Type II diabetes mellitus (HCC)   PVD (peripheral vascular disease) (HCC)   PAD (peripheral artery disease) (HCC)   Osteomyelitis of second toe of left foot (HCC)   Body mass index is 29.92 kg/m.   Severe peripheral vascular disease with right big toe gangrene: Continue IV heparin infusion and monitor PTT per protocol.  Analgesics as needed for pain.  Underwent angioplasty of the right profunda femoris artery. Patient continues to have poor proximal tibial pulses and will require repeat procedure this Friday to improve perfusion.  Essential hypertension. Chronic systolic CHF Persistent hypotension Patient is on multiple antihypertensive medication. Blood pressure  currently remaining soft. Currently not on any antihypertensive medication. We will continue with Flomax for now. Patient was actually given 250 cc bolus on 08/03/2019.  Anemia of chronic disease:  Monitor H&H and transfuse as needed.  ESRD on peritoneal dialysis:  Follow-up with nephrologist for management.  Type 2 diabetes mellitus:  NovoLog as needed for hyperglycemia  CAD/chronic systolic CHF with EF of 35 to 40% in January 2020:  Continue fluid management with peritoneal dialysis.   Continue aspirin and Lipitor  Recent osteomyelitis of left foot: Continue antibiotics (ceftazidime, vancomycin and Bactrim)   Family Communication/Anticipated D/C date and plan/Code Status   DVT prophylaxis: IV heparin infusion Code Status: Full code Family Communication: Plan discussed with the patient Disposition Plan: Physical therapy consulted.  Subjective:  Denies any acute complaint.  Initially had some dizziness but currently asymptomatic.  No nausea or vomiting but no chest pain no abdominal pain.  Continues to have some pain in the achiness in his left leg.  Objective:    Vitals:   08/03/19 0843 08/03/19 0923 08/03/19 1119 08/03/19 1637  BP: (!) 91/55 (!) 99/36 (!) 93/41 (!) 90/40  Pulse: 68 68 66 71  Resp: 19   19  Temp: 97.6 F (36.4 C)   97.6 F (36.4 C)  TempSrc: Oral   Oral  SpO2: 90%   100%  Weight:      Height:        Intake/Output Summary (Last 24 hours) at 08/03/2019 1822 Last data filed at 08/03/2019 0814 Gross per 24 hour  Intake 500.27 ml  Output 847 ml  Net -346.73 ml  Filed Weights   07/31/19 2030 08/01/19 0549 08/02/19 0424  Weight: 96.3 kg 94.5 kg 91.9 kg    Exam:  General: alert and oriented to time, place, and person. Appear in mild distress, affect appropriate Eyes: PERRL, Conjunctiva normal ENT: Oral Mucosa Clear, moist  Neck: no JVD, no Abnormal Mass Or lumps Cardiovascular: S1 and S2 Present, no Murmur, peripheral pulses  symmetrical Respiratory: good respiratory effort, Bilateral Air entry equal and Decreased, no signs of accessory muscle use, Clear to Auscultation, no Crackles, no wheezes Abdomen: Bowel Sound present, Soft and no tenderness, no hernia Skin: no rashes  Extremities: trace Pedal edema, no calf tenderness Neurologic: without any new focal findings Gait not checked due to patient safety concerns    Data Reviewed:   I have personally reviewed following labs and imaging studies:  Labs: Labs show the following:   Basic Metabolic Panel: Recent Labs  Lab 07/31/19 0817 08/01/19 0519 08/02/19 0620 08/03/19 0559  NA 136 138 138 139  K 4.6 4.4 4.2 4.4  CL 99 103 102 102  CO2 23 25 25 25   GLUCOSE 216* 128* 126* 121*  BUN 60* 59* 55* 49*  CREATININE 7.36* 7.21* 7.42* 7.32*  CALCIUM 7.5* 7.8* 8.0* 8.3*  MG  --   --   --  2.5*   GFR Estimated Creatinine Clearance: 12 mL/min (A) (by C-G formula based on SCr of 7.32 mg/dL (H)). Liver Function Tests: Recent Labs  Lab 07/31/19 0817 08/01/19 0519  AST 23 14*  ALT 19 14  ALKPHOS 110 91  BILITOT 0.8 0.7  PROT 6.4* 5.5*  ALBUMIN 2.6* 2.2*   No results for input(s): LIPASE, AMYLASE in the last 168 hours. No results for input(s): AMMONIA in the last 168 hours. Coagulation profile Recent Labs  Lab 07/31/19 0817 08/01/19 0519  INR 1.1 1.3*    CBC: Recent Labs  Lab 07/31/19 0817 08/01/19 0003 08/01/19 0842 08/02/19 0620 08/03/19 0559  WBC 11.7* 9.6  --  9.7 9.8  NEUTROABS  --   --   --  7.4  --   HGB 8.4* 7.1* 7.4* 7.7* 7.7*  HCT 26.9* 23.1*  --  24.6* 24.0*  MCV 98.2 97.9  --  98.0 94.9  PLT 266 221  --  244 255   Cardiac Enzymes: No results for input(s): CKTOTAL, CKMB, CKMBINDEX, TROPONINI in the last 168 hours. BNP (last 3 results) No results for input(s): PROBNP in the last 8760 hours. CBG: Recent Labs  Lab 08/02/19 1632 08/02/19 2015 08/03/19 0843 08/03/19 1156 08/03/19 1637  GLUCAP 112* 129* 117* 122* 95    D-Dimer: No results for input(s): DDIMER in the last 72 hours. Hgb A1c: No results for input(s): HGBA1C in the last 72 hours. Lipid Profile: No results for input(s): CHOL, HDL, LDLCALC, TRIG, CHOLHDL, LDLDIRECT in the last 72 hours. Thyroid function studies: No results for input(s): TSH, T4TOTAL, T3FREE, THYROIDAB in the last 72 hours.  Invalid input(s): FREET3 Anemia work up: No results for input(s): VITAMINB12, FOLATE, FERRITIN, TIBC, IRON, RETICCTPCT in the last 72 hours. Sepsis Labs: Recent Labs  Lab 07/31/19 0817 08/01/19 0003 08/02/19 0620 08/03/19 0559  WBC 11.7* 9.6 9.7 9.8    Microbiology Recent Results (from the past 240 hour(s))  SARS CORONAVIRUS 2 (TAT 6-24 HRS) Nasopharyngeal Nasopharyngeal Swab     Status: None   Collection Time: 07/28/19 11:24 AM   Specimen: Nasopharyngeal Swab  Result Value Ref Range Status   SARS Coronavirus 2 NEGATIVE NEGATIVE Final  Comment: (NOTE) SARS-CoV-2 target nucleic acids are NOT DETECTED. The SARS-CoV-2 RNA is generally detectable in upper and lower respiratory specimens during the acute phase of infection. Negative results do not preclude SARS-CoV-2 infection, do not rule out co-infections with other pathogens, and should not be used as the sole basis for treatment or other patient management decisions. Negative results must be combined with clinical observations, patient history, and epidemiological information. The expected result is Negative. Fact Sheet for Patients: SugarRoll.be Fact Sheet for Healthcare Providers: https://www.woods-mathews.com/ This test is not yet approved or cleared by the [place] FDA and  has been authorized for detection and/or diagnosis of SARS-CoV-2 by FDA under an Emergency Use Authorization (EUA). This EUA will remain  in effect (meaning this test can be used) for the duration of the COVID-19 declaration under Section 56 4(b)(1) of the Act, 21  U.S.C. section 360bbb-3(b)(1), unless the authorization is terminated or revoked sooner. Performed at [facility], [place] [address], [place], [place] [address]     Procedures and diagnostic studies:  No results found.  Medications:   . aspirin EC  81 mg Oral Daily  . atorvastatin  40 mg Oral Daily  . calcitRIOL  0.25 mcg Oral Daily  . calcium acetate  2,001 mg Oral TID WC  . clopidogrel  75 mg Oral Q breakfast  . gabapentin  300 mg Oral QHS  . gentamicin cream  1 application Topical Daily  . insulin aspart  0-5 Units Subcutaneous QHS  . insulin aspart  0-9 Units Subcutaneous TID WC  . insulin aspart  2 Units Subcutaneous TID WC  . pantoprazole  40 mg Oral Daily  . polyethylene glycol  17 g Oral BID  . senna-docusate  2 tablet Oral BID  . sodium chloride flush  3 mL Intravenous Q12H  . sulfamethoxazole-trimethoprim  1 tablet Oral Daily  . tamsulosin  0.4 mg Oral Daily  . vancomycin variable dose per unstable renal function (pharmacist dosing)   Does not apply See admin instructions   Continuous Infusions: . sodium chloride    . cefTAZidime (FORTAZ)  IV 1 g (08/03/19 1750)  . dialysis solution 1.5% low-MG/low-CA    . heparin 1,300 Units/hr (08/03/19 0818)     LOS: 3 days   Berle Mull  [facility]   *Please refer to [email or web address], password [email or web address] to get updated schedule on who will round on this patient, as hospitalists switch teams weekly. If 7PM-7AM, please contact night-coverage at [email or web address], password [email or web address] for any overnight needs.  08/03/2019, 6:22 PM

## 2019-08-03 NOTE — Progress Notes (Signed)
 Vein & Vascular Surgery Daily Progress Note   Subjective: 2 Days Post-Op 1. Introduction catheter intorightlower extremity 3rd order catheter placement  2.Contrast injectionrightlower extremity for distal runoff  3. Percutaneous transluminal angioplastyright profunda femorisartery 4. Star close closureleftcommon femoral arteriotomy  With continued RLE pain and tingling.   Objective: Vitals:   08/02/19 2009 08/03/19 0454 08/03/19 0843 08/03/19 0923  BP: (!) 101/30 (!) 105/38 (!) 91/55 (!) 99/36  Pulse: 70 67 68 68  Resp: 18 18 19    Temp: 97.9 F (36.6 C) 97.6 F (36.4 C) 97.6 F (36.4 C)   TempSrc: Oral Oral Oral   SpO2: 97% 92% 90%   Weight:      Height:        Intake/Output Summary (Last 24 hours) at 08/03/2019 1055 Last data filed at 08/03/2019 2831 Gross per 24 hour  Intake 740.27 ml  Output 1022 ml  Net -281.73 ml   Physical Exam: A&Ox3, NAD CV: RRR Pulmonary: CTA Bilaterally Abdomen: Soft, Nontender, Nondistended Left Groin:             Access Site: clean, dry and intact.  Vascular:             Right Lower Extremity: Thigh soft.  Calf soft.  Extremity is warm distally to the foot.  Nonpalpable pedal pulses. The foot is slightly warmer when compared to yesterday however does remain cool.  Gangrenous first toe is stable.   Laboratory: CBC    Component Value Date/Time   WBC 9.8 08/03/2019 0559   HGB 7.7 (L) 08/03/2019 0559   HGB 10.7 (L) 12/13/2014 0102   HCT 24.0 (L) 08/03/2019 0559   HCT 33.8 (L) 12/13/2014 0102   PLT 255 08/03/2019 0559   PLT 510 (H) 12/13/2014 0102   BMET    Component Value Date/Time   NA 139 08/03/2019 0559   NA 140 04/16/2015   NA 140 12/13/2014 0102   K 4.4 08/03/2019 0559   K 4.6 12/13/2014 0102   CL 102 08/03/2019 0559   CL 111 12/13/2014 0102   CO2 25 08/03/2019 0559   CO2 23 12/13/2014 0102   GLUCOSE 121 (H) 08/03/2019 0559   GLUCOSE 213 (H)  12/13/2014 0102   BUN 49 (H) 08/03/2019 0559   BUN 44 (A) 04/16/2015   BUN 25 (H) 12/13/2014 0102   CREATININE 7.32 (H) 08/03/2019 0559   CREATININE 1.95 (H) 12/13/2014 0102   CALCIUM 8.3 (L) 08/03/2019 0559   CALCIUM 8.0 (L) 12/13/2014 0102   GFRNONAA 7 (L) 08/03/2019 0559   GFRNONAA 37 (L) 12/13/2014 0102   GFRAA 9 (L) 08/03/2019 0559   GFRAA 43 (L) 12/13/2014 0102   Assessment/Planning: The patient is a 58 year old male with multiple medical issues including known peripheral artery disease status post a right lower extremity angiogram with intervention POD#1 1) PAD: Patient with sluggish PT no AT or peroneal.  At high risk for limb loss. Patient is on schedule for repeat angiogram using pedal approach tomorrow with Dr. [name]. Patient would like to proceed. Ordered placed. Continue heparin gtt. 2) ESRD: Patient is currently maintained via peritoneal dialysis.  No issues with his dialysis catheter at this time.  Continue to dialyze per normal routine.  Discussed with Dr. Eber Hong Terris Bodin PA-C 08/03/2019 10:55 AM

## 2019-08-03 NOTE — Progress Notes (Signed)
ccpd completed 

## 2019-08-04 ENCOUNTER — Inpatient Hospital Stay
Admission: EM | Disposition: A | Discharge status: Home / Self Care | Payer: Self-pay | Source: Home / Self Care | Attending: Internal Medicine

## 2019-08-04 ENCOUNTER — Encounter: Payer: Self-pay | Admitting: Vascular Surgery

## 2019-08-04 DIAGNOSIS — I96 Gangrene, not elsewhere classified: Secondary | ICD-10-CM | POA: Diagnosis not present

## 2019-08-04 DIAGNOSIS — E1122 Type 2 diabetes mellitus with diabetic chronic kidney disease: Secondary | ICD-10-CM | POA: Diagnosis not present

## 2019-08-04 DIAGNOSIS — Z89421 Acquired absence of other right toe(s): Secondary | ICD-10-CM

## 2019-08-04 DIAGNOSIS — N186 End stage renal disease: Secondary | ICD-10-CM | POA: Diagnosis not present

## 2019-08-04 DIAGNOSIS — Z89411 Acquired absence of right great toe: Secondary | ICD-10-CM

## 2019-08-04 DIAGNOSIS — E1152 Type 2 diabetes mellitus with diabetic peripheral angiopathy with gangrene: Secondary | ICD-10-CM | POA: Diagnosis not present

## 2019-08-04 DIAGNOSIS — I70261 Atherosclerosis of native arteries of extremities with gangrene, right leg: Secondary | ICD-10-CM

## 2019-08-04 HISTORY — PX: LOWER EXTREMITY ANGIOGRAPHY: CATH118251

## 2019-08-04 LAB — CBC
HCT: 25.9 % — ABNORMAL LOW (ref 39.0–52.0)
Hemoglobin: 8.3 g/dL — ABNORMAL LOW (ref 13.0–17.0)
MCH: 30.3 pg (ref 26.0–34.0)
MCHC: 32 g/dL (ref 30.0–36.0)
MCV: 94.5 fL (ref 80.0–100.0)
Platelets: 258 10*3/uL (ref 150–400)
RBC: 2.74 MIL/uL — ABNORMAL LOW (ref 4.22–5.81)
RDW: 16.1 % — ABNORMAL HIGH (ref 11.5–15.5)
WBC: 10.5 10*3/uL (ref 4.0–10.5)
nRBC: 0 % (ref 0.0–0.2)

## 2019-08-04 LAB — VANCOMYCIN, RANDOM: Vancomycin Rm: 19

## 2019-08-04 LAB — BASIC METABOLIC PANEL
Anion gap: 11 (ref 5–15)
BUN: 44 mg/dL — ABNORMAL HIGH (ref 6–20)
CO2: 27 mmol/L (ref 22–32)
Calcium: 8.4 mg/dL — ABNORMAL LOW (ref 8.9–10.3)
Chloride: 100 mmol/L (ref 98–111)
Creatinine, Ser: 7.47 mg/dL — ABNORMAL HIGH (ref 0.61–1.24)
GFR calc Af Amer: 8 mL/min — ABNORMAL LOW (ref >60)
GFR calc non Af Amer: 7 mL/min — ABNORMAL LOW (ref >60)
Glucose, Bld: 117 mg/dL — ABNORMAL HIGH (ref 70–99)
Potassium: 4.2 mmol/L (ref 3.5–5.1)
Sodium: 138 mmol/L (ref 135–145)

## 2019-08-04 LAB — HEPARIN LEVEL (UNFRACTIONATED)
Heparin Unfractionated: 0.13 IU/mL — ABNORMAL LOW (ref 0.30–0.70)
Heparin Unfractionated: <0.1 IU/mL — ABNORMAL LOW (ref 0.30–0.70)
Heparin Unfractionated: 0.23 IU/mL — ABNORMAL LOW (ref 0.30–0.70)

## 2019-08-04 LAB — GLUCOSE, CAPILLARY
Glucose-Capillary: 103 mg/dL — ABNORMAL HIGH (ref 70–99)
Glucose-Capillary: 106 mg/dL — ABNORMAL HIGH (ref 70–99)
Glucose-Capillary: 159 mg/dL — ABNORMAL HIGH (ref 70–99)
Glucose-Capillary: 82 mg/dL (ref 70–99)

## 2019-08-04 SURGERY — LOWER EXTREMITY ANGIOGRAPHY
Anesthesia: Moderate Sedation | Laterality: Right

## 2019-08-04 MED ORDER — ONDANSETRON HCL 4 MG/2ML IJ SOLN: 4.0000 mg | Freq: Four times a day (QID) | INTRAMUSCULAR | Status: DC | PRN

## 2019-08-04 MED ORDER — HEPARIN BOLUS VIA INFUSION
2000.0000 [IU] | Freq: Once | INTRAVENOUS | Status: AC
  Administered 2019-08-04: 2000 [IU] via INTRAVENOUS
  Filled 2019-08-04: qty 2000

## 2019-08-04 MED ORDER — CLINDAMYCIN PHOSPHATE 300 MG/50ML IV SOLN
INTRAVENOUS | Status: AC
  Filled 2019-08-04: qty 50

## 2019-08-04 MED ORDER — MORPHINE SULFATE (PF) 4 MG/ML IV SOLN
2.0000 mg | INTRAVENOUS | Status: DC | PRN
  Administered 2019-08-05: 2 mg via INTRAVENOUS
  Filled 2019-08-04: qty 1

## 2019-08-04 MED ORDER — HYDROMORPHONE HCL 1 MG/ML IJ SOLN: 1.0000 mg | Freq: Once | INTRAMUSCULAR | Status: DC | PRN

## 2019-08-04 MED ORDER — ATORVASTATIN CALCIUM 10 MG PO TABS
10.0000 mg | ORAL_TABLET | Freq: Every day | ORAL | Status: DC
  Filled 2019-08-04: qty 1

## 2019-08-04 MED ORDER — FENTANYL CITRATE (PF) 100 MCG/2ML IJ SOLN
INTRAMUSCULAR | Status: DC | PRN
  Administered 2019-08-04: 25 ug via INTRAVENOUS
  Administered 2019-08-04: 12.5 ug via INTRAVENOUS
  Administered 2019-08-04: 50 ug via INTRAVENOUS
  Administered 2019-08-04: 12.5 ug via INTRAVENOUS

## 2019-08-04 MED ORDER — HEPARIN BOLUS VIA INFUSION
1000.0000 [IU] | Freq: Once | INTRAVENOUS | Status: AC
  Administered 2019-08-04: 1000 [IU] via INTRAVENOUS
  Filled 2019-08-04: qty 1000

## 2019-08-04 MED ORDER — SODIUM CHLORIDE 0.9 % IV SOLN: 250.0000 mL | INTRAVENOUS | Status: DC | PRN

## 2019-08-04 MED ORDER — SODIUM CHLORIDE 0.9 % IV SOLN
INTRAVENOUS | Status: DC
  Administered 2019-08-04: 10:00:00 via INTRAVENOUS

## 2019-08-04 MED ORDER — MIDAZOLAM HCL 5 MG/5ML IJ SOLN
INTRAMUSCULAR | Status: AC
  Filled 2019-08-04: qty 5

## 2019-08-04 MED ORDER — VANCOMYCIN HCL IN DEXTROSE 1-5 GM/200ML-% IV SOLN
1000.0000 mg | Freq: Once | INTRAVENOUS | Status: AC
  Administered 2019-08-05: 1000 mg via INTRAVENOUS
  Filled 2019-08-04 (×2): qty 200

## 2019-08-04 MED ORDER — FAMOTIDINE 20 MG PO TABS: 40.0000 mg | ORAL_TABLET | Freq: Once | ORAL | Status: DC | PRN

## 2019-08-04 MED ORDER — METHYLPREDNISOLONE SODIUM SUCC 125 MG IJ SOLR: 125.0000 mg | Freq: Once | INTRAMUSCULAR | Status: DC | PRN

## 2019-08-04 MED ORDER — FENTANYL CITRATE (PF) 100 MCG/2ML IJ SOLN
INTRAMUSCULAR | Status: AC
  Filled 2019-08-04: qty 2

## 2019-08-04 MED ORDER — SODIUM CHLORIDE 0.9% FLUSH: 3.0000 mL | Freq: Two times a day (BID) | INTRAVENOUS | Status: DC

## 2019-08-04 MED ORDER — HEPARIN SODIUM (PORCINE) 1000 UNIT/ML IJ SOLN
INTRAMUSCULAR | Status: AC
  Filled 2019-08-04: qty 1

## 2019-08-04 MED ORDER — MIDAZOLAM HCL 2 MG/2ML IJ SOLN
INTRAMUSCULAR | Status: DC | PRN
  Administered 2019-08-04: 0.5 mg via INTRAVENOUS
  Administered 2019-08-04: 1 mg via INTRAVENOUS
  Administered 2019-08-04: 0.5 mg via INTRAVENOUS
  Administered 2019-08-04: 2 mg via INTRAVENOUS

## 2019-08-04 MED ORDER — HEPARIN SODIUM (PORCINE) 1000 UNIT/ML IJ SOLN
INTRAMUSCULAR | Status: DC | PRN
  Administered 2019-08-04: 4000 [IU] via INTRAVENOUS

## 2019-08-04 MED ORDER — MIDAZOLAM HCL 2 MG/ML PO SYRP
8.0000 mg | ORAL_SOLUTION | Freq: Once | ORAL | Status: DC | PRN
  Filled 2019-08-04: qty 4

## 2019-08-04 MED ORDER — CLINDAMYCIN PHOSPHATE 300 MG/50ML IV SOLN
300.0000 mg | Freq: Once | INTRAVENOUS | Status: AC
  Administered 2019-08-04: 300 mg via INTRAVENOUS
  Filled 2019-08-04: qty 50

## 2019-08-04 MED ORDER — OXYCODONE HCL 5 MG PO TABS
5.0000 mg | ORAL_TABLET | ORAL | Status: DC | PRN
  Administered 2019-08-04 – 2019-08-07 (×3): 10 mg via ORAL
  Filled 2019-08-04 (×4): qty 2

## 2019-08-04 MED ORDER — DIPHENHYDRAMINE HCL 50 MG/ML IJ SOLN: 50.0000 mg | Freq: Once | INTRAMUSCULAR | Status: DC | PRN

## 2019-08-04 MED ORDER — ACETAMINOPHEN 325 MG PO TABS: 650.0000 mg | ORAL_TABLET | ORAL | Status: DC | PRN

## 2019-08-04 MED ORDER — MORPHINE SULFATE (PF) 4 MG/ML IV SOLN: 2.0000 mg | INTRAVENOUS | Status: DC | PRN

## 2019-08-04 MED ORDER — SODIUM CHLORIDE 0.9% FLUSH: 3.0000 mL | INTRAVENOUS | Status: DC | PRN

## 2019-08-04 SURGICAL SUPPLY — 36 items
BALLN DORADO 4X100X135 (BALLOONS) ×3
BALLN DORADO 6X100X135 (BALLOONS) ×3
BALLN LUTONIX  018 4X60X130 (BALLOONS) ×2
BALLN LUTONIX 018 4X60X130 (BALLOONS) ×1
BALLN ULTRVRSE 2.5X40X150 (BALLOONS) ×3
BALLN ULTRVRSE 3X150X130 (BALLOONS) ×3
BALLON DORADO 5X100X135 (BALLOONS) ×3
BALLOON DORADO 4X100X135 (BALLOONS) ×1 IMPLANT
BALLOON DORADO 5X100X135 (BALLOONS) ×1 IMPLANT
BALLOON DORADO 6X100X135 (BALLOONS) ×1 IMPLANT
BALLOON LUTONIX 018 4X60X130 (BALLOONS) ×1 IMPLANT
BALLOON ULTRVRSE 150X40X2.5 (BALLOONS) ×1 IMPLANT
BALLOON ULTRVRSE 3X150X130 (BALLOONS) ×1 IMPLANT
CATH BEACON 5 .035 65 RIM TIP (CATHETERS) ×3 IMPLANT
CATH SEEKER .018X150 (CATHETERS) ×3 IMPLANT
CATH SEEKER .035X150CM (CATHETERS) ×3 IMPLANT
DEVICE PRESTO INFLATION (MISCELLANEOUS) ×3 IMPLANT
DEVICE RAD COMP TR BAND LRG (VASCULAR PRODUCTS) ×3 IMPLANT
DEVICE STARCLOSE SE CLOSURE (Vascular Products) ×3 IMPLANT
DEVICE TORQUE .025-.038 (MISCELLANEOUS) ×3 IMPLANT
DRAPE INCISE IOBAN 66X45 STRL (DRAPES) ×3 IMPLANT
ENSNARE 9-15 (MISCELLANEOUS) ×3 IMPLANT
GLIDECATH 4FR STR (CATHETERS) ×3 IMPLANT
GLIDEWIRE ADV .035X260CM (WIRE) ×3 IMPLANT
GLIDEWIRE STIFF .35X180X3 HYDR (WIRE) ×3 IMPLANT
GUIDEWIRE PFTE-COATED .018X300 (WIRE) ×3 IMPLANT
NEEDLE ENTRY 21GA 7CM ECHOTIP (NEEDLE) ×3 IMPLANT
PACK ANGIOGRAPHY (CUSTOM PROCEDURE TRAY) ×3 IMPLANT
SET INTRO CAPELLA COAXIAL (SET/KITS/TRAYS/PACK) ×3 IMPLANT
SHEATH BRITE TIP 5FRX11 (SHEATH) ×3 IMPLANT
SHEATH HALO 035 5FRX10 (SHEATH) ×3 IMPLANT
SHEATH MICROPUNCTURE PEDAL 4FR (SHEATH) ×3 IMPLANT
SHEATH RAABE 6FR (SHEATH) ×3 IMPLANT
STENT VIABAHN 6X100X120 (Permanent Stent) ×3 IMPLANT
STENT VIABAHN 6X150X120 (Permanent Stent) ×3 IMPLANT
WIRE G V18X300CM (WIRE) ×3 IMPLANT

## 2019-08-04 NOTE — Progress Notes (Signed)
Pharmacy Antibiotic Note  Scott [name] is a 58 y.o. male admitted on 07/31/2019 with osteomyelitis. He has ESRD for which he undergoes peritoneal dialysis and receives this treatment overnight. 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 He underwent a successful recanalization of the left lower extremity for limb salvage performed by Dr [name] on 11/17. On 07/19/2019 he was taken for surgery by podiatry and underwent I&D of the infected bone and soft tissue of the left first metatarsal and amputation of left second toe with partial ray resection.  Wound cultures were sent from both and grew Achromobacter spp (started on TMP/SMZ as outpatient) and MRSE.  He was receiving ceftazidime and vancomycin via peritoneal dialysis.    11/30 @ 1540 Vancomycin random  = 28 mcg/mL 12/2 @ 0620 Vancomycin random = 25 mcg/mL 12/4 @ 0512 Vancomycin random = 19 mcg/mL  Plan: 1. Give vancomycin 1 g on 12/5 at 1000 2. Continue bactrim 1 SS tab daily and ceftazidime 1gm IV q24h 3. Plan to draw next vancomycin level some time next week  Height: 5\' 9"  (175.3 cm) Weight: 202 lb 9.6 oz (91.9 kg) IBW/kg (Calculated) : 70.7  Temp (24hrs), Avg:97.7 F (36.5 C), Min:97.3 F (36.3 C), Max:97.8 F (36.6 C)  Recent Labs  Lab 07/31/19 0817  08/01/19 0003 08/01/19 0519 08/02/19 0620 08/03/19 0559 08/04/19 0512  WBC 11.7*  --  9.6  --  9.7 9.8 10.5  CREATININE 7.36*  --   --  7.21* 7.42* 7.32* 7.47*  VANCORANDOM  --    < >  --   --  25  --  19   < > = values in this interval not displayed.    Estimated Creatinine Clearance: 11.8 mL/min (A) (by C-G formula based on SCr of 7.47 mg/dL (H)).    Antimicrobials this admission: 11/16 ceftriaxone >> 11/19 11/16 vancomycin >>  11/20 ceftazidime >> 11/30 SMX/TMP (patient reported not taking to med rec) >>  Microbiology results: 11/13 SARS CoV-2:  negative  11/18 WCx: Achromobacter (TMP/SMZ susc), MRSE.   11/17 MRSA PCR: negative  Thank you for allowing pharmacy to be a part of this patient's care.  [place] Resident 08/04/2019 11:30 AM

## 2019-08-04 NOTE — Progress Notes (Signed)
PT Cancellation Note  Patient Details Name: Scott [name] MRN: [id number] DOB: 1961-02-26   Cancelled Treatment:    Reason Eval/Treat Not Completed: Patient at procedure or test/unavailable Pt out of room for angioplasty all morning.  Will attempt at later time/date as appropriate.  Kreg Shropshire, DPT 08/04/2019, 2:06 PM

## 2019-08-04 NOTE — Progress Notes (Signed)
Gave report to [employer], RN and access site clean, dry, and intact. Informed RN to restart heparin at prescribed rate per Dr. [name].

## 2019-08-04 NOTE — Consult Note (Addendum)
ANTICOAGULATION CONSULT NOTE  Pharmacy Consult for Heparin Drip Indication: Ischemic Leg  No Known Allergies  Patient Measurements: Height: 5\' 9"  (175.3 cm) Weight: 202 lb 9.6 oz (91.9 kg) IBW/kg (Calculated) : 70.7 Heparin Dosing Weight: 91.3 kg  Vital Signs: Temp: 98.5 F (36.9 C) (12/04 1927) Temp Source: Oral (12/04 1927) BP: 101/73 (12/04 1927) Pulse Rate: 76 (12/04 1927)  Labs: Recent Labs    08/02/19 0620  08/03/19 0559 08/04/19 0512 08/04/19 1439 08/04/19 2302  HGB 7.7*  --  7.7* 8.3*  --   --   HCT 24.6*  --  24.0* 25.9*  --   --   PLT 244  --  255 258  --   --   HEPARINUNFRC 0.55   < > 0.33 0.13* <0.10* 0.23*  CREATININE 7.42*  --  7.32* 7.47*  --   --    < > = values in this interval not displayed.    Estimated Creatinine Clearance: 11.8 mL/min (A) (by C-G formula based on SCr of 7.47 mg/dL (H)).   Medical History: Past Medical History:  Diagnosis Date  . Acute respiratory failure with hypoxia ([place]) 09/17/2018  . Arthritis    "left arm; right leg" (12/13/2014)  . Asthma   . CHF (congestive heart failure) ([place])   . Chronic disease anemia    [name] 12/13/2014  . Chronic kidney disease (CKD), stage IV (severe) ([place])    [name] 12/13/2014... on dialysis  . Complication of anesthesia    unable to urinate after CAPD urgery  . Continuous ambulatory peritoneal dialysis status ([place])   . Coronary artery disease    [name] 12/13/2014  . Depression   . Dysrhythmia    patient unaware of irregular heartbeat  . GERD (gastroesophageal reflux disease)   . High cholesterol    [name] 12/13/2014  . Hypertension   . Non-Q wave myocardial infarction ([place])    [name] 12/13/2014  . PVD (peripheral vascular disease) ([place])    [name] 12/13/2014  . Type II diabetes mellitus (HCC)     Medications:  No PTA anticoagulant meds per record  Assessment: CORNELIS KLUVER is a 58 y.o. male with a history of CHF, CKD, GERD, hypertension, PAD who comes the ED complaining of  worsening of his chronic right leg pain which is below the knee and all the way down to the foot.  Over the last 2 days it has rapidly worsened and is now intractable, unbearable.  Pharmacy has been consulted to initiate/monitor a full-dose heparin drip for ischemic leg.  12/1 @ 0842: HL 0.51. Level therapeutic x1.  12/1 @ 2254 HL: 0.87. Level is supratherapeutic  12/2 @ 0620 HL: 0.55 Therapeutic  12/2 @ 1300 HL: 0.88 Supratherapeutic 12/2:  HL @ 2200 = 0.33 12/3 @ 0559 HL: 0.33 Level therapeutic x2.  12/4 @ 0512 HL: 0.13. infusion increased to 1500 units/hr   Goal of Therapy:  Heparin level 0.3-0.7 units/ml Monitor platelets by anticoagulation protocol: Yes   Plan:  12/4 @  2302 HL: 0.23. Level is subtherapeutic.  Will order 1000 unit bolus and increase infusion rate 1650 units/hr.    Recheck HL 6 hours after rate change.   Stop time entered for 0600 on 12/5 by Sela Hua, PA-C  Will continue to check HL and CBCs daily while on heparin infusion.   Pernell Dupre, PharmD, BCPS Clinical Pharmacist 08/04/2019 11:32 PM

## 2019-08-04 NOTE — Progress Notes (Signed)
   08/04/19 0910  Peritoneal Catheter Left lower abdomen Continuous ambulatory  Placement Date/Time: 04/07/17 1328   Procedural Verification: Medical records & consent reviewed  Time out: Correct Patient;Correct Site;Correct Procedure;Special equipment/requirements available  Person Inserting Catheter: Dr. [name]  Catheter Locat...  Site Assessment Clean;Dry;Intact  Drainage Description None  Catheter status Deaccessed;Clamped;Capped  Dressing Gauze/Drain sponge  Dressing Status Clean;Dry;Intact  Cycler Setup  Pause Option Status Completed  Exit Site Care Performed No (Comment)  Completion  Effluent Appearance Clear;Yellow  Exit Site Care Performed No (Comment)  Treatment Status Complete  pt stable no changes

## 2019-08-04 NOTE — Consult Note (Addendum)
ANTICOAGULATION CONSULT NOTE  Pharmacy Consult for Heparin Drip Indication: Ischemic Leg  No Known Allergies  Patient Measurements: Height: 5\' 9"  (175.3 cm) Weight: (refused to stand ) IBW/kg (Calculated) : 70.7 Heparin Dosing Weight: 91.3 kg  Vital Signs: Temp: 97.8 F (36.6 C) (12/04 0336) Temp Source: Oral (12/04 0336) BP: 121/44 (12/04 0336) Pulse Rate: 67 (12/04 0336)  Labs: Recent Labs    08/02/19 0620  08/02/19 2154 08/03/19 0559 08/04/19 0512  HGB 7.7*  --   --  7.7* 8.3*  HCT 24.6*  --   --  24.0* 25.9*  PLT 244  --   --  255 258  HEPARINUNFRC 0.55   < > 0.33 0.33 0.13*  CREATININE 7.42*  --   --  7.32* 7.47*   < > = values in this interval not displayed.    Estimated Creatinine Clearance: 11.8 mL/min (A) (by C-G formula based on SCr of 7.47 mg/dL (H)).   Medical History: Past Medical History:  Diagnosis Date  . Acute respiratory failure with hypoxia ([place]) 09/17/2018  . Arthritis    "left arm; right leg" (12/13/2014)  . Asthma   . CHF (congestive heart failure) ([place])   . Chronic disease anemia    [name] 12/13/2014  . Chronic kidney disease (CKD), stage IV (severe) ([place])    [name] 12/13/2014... on dialysis  . Complication of anesthesia    unable to urinate after CAPD urgery  . Continuous ambulatory peritoneal dialysis status ([place])   . Coronary artery disease    [name] 12/13/2014  . Depression   . Dysrhythmia    patient unaware of irregular heartbeat  . GERD (gastroesophageal reflux disease)   . High cholesterol    [name] 12/13/2014  . Hypertension   . Non-Q wave myocardial infarction ([place])    [name] 12/13/2014  . PVD (peripheral vascular disease) ([place])    [name] 12/13/2014  . Type II diabetes mellitus (HCC)     Medications:  No PTA anticoagulant meds per record  Assessment: Scott [name] is a 58 y.o. male with a history of CHF, CKD, GERD, hypertension, PAD who comes the ED complaining of worsening of his chronic right leg pain which is below  the knee and all the way down to the foot.  Over the last 2 days it has rapidly worsened and is now intractable, unbearable.  Pharmacy has been consulted to initiate/monitor a full-dose heparin drip for ischemic leg.  12/1 @ 0842: HL 0.51. Level therapeutic x1.  12/1 @ 2254 HL: 0.87. Level is supratherapeutic  12/2 @ 0620 HL: 0.55 Therapeutic  12/2 @ 1300 HL: 0.88 Supratherapeutic 12/2:  HL @ 2200 = 0.33 12/3 @ 0559 HL: 0.33 Level therapeutic x2.    Goal of Therapy:  Heparin level 0.3-0.7 units/ml Monitor platelets by anticoagulation protocol: Yes   Plan:  12/4 @ 0512 HL: 0.13. Level is subtherapeutic. Spoke with RN to confirm no interruptions in heparin infusion.   Will order 2000 unit bolus and increase infusion rate 1500 units/hr.    Recheck HL 6 hours after rate change.   Will continue to check HL and CBCs daily while on heparin infusion.   Pernell Dupre, PharmD, BCPS Clinical Pharmacist 08/04/2019 6:33 AM

## 2019-08-04 NOTE — Op Note (Signed)
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  Introduction catheter into right lower extremity 3rd order catheter placement left common femoral approach 2. Contrast injection right lower extremity for distal runoff  3.  Ultrasound-guided access to the right posterior tibial artery             4.   Percutaneous transluminal angioplasty and stent placement right superficial femoral and popliteal arteries to 6 mm with Viabahn stents 4. Percutaneous transluminal angioplasty right peroneal to 2.5  5. Star close closure left common femoral arteriotomy  Anesthesia: Conscious sedation was administered under my direct supervision by the interventional radiology RN. IV Versed plus fentanyl were utilized. Continuous ECG, pulse oximetry and blood pressure was monitored throughout the entire procedure.  Conscious sedation was for a total of 120 minutes.  Sheath: 4 French halo sheath retrograde right posterior tibial artery; 6 French Raby sheath retrograde left common femoral artery  Contrast: 65 cc  Fluoroscopy Time: 15.9 minutes  Indications: Scott [name] presents with increasing pain of the right lower extremity.  His rest pain is associated with gangrenous changes of the right great toe.  This suggests the patient is having limb threatening ischemia. The risks and benefits are reviewed all questions answered patient agrees to proceed.  Procedure:Scott [name] is a 58 y.o. y.o. male y.o. male who was identified and appropriate procedural time out was performed. The patient was then placed supine on the table and prepped and draped in the  usual sterile fashion.   Ultrasound was placed in a sterile sleeve.  The right posterior tibial artery was identified by ultrasound.  1% lidocaine was infiltrated in the soft tissues.  A micropuncture needle was then used to access the posterior tibial artery.  Image was recorded for the permanent record.  Microwire followed by a micro-sheath was then inserted.  Straight wire was then advanced through the microsheath and a 4 French halo sheath inserted.  A stiff angled glide wire in combination with a 4 French straight glide catheter was then advanced in a retrograde fashion through the tibial and negotiated into first the popliteal and then advanced into the mid superficial femoral artery.  Hand-injection contrast confirmed intraluminal positioning from below.  Having successfully crossed the occlusion I then turned my attention to the left groin  Ultrasound was returned to the field in the sterile sleeve and the left groin was evaluated the left common femoral artery was echolucent and pulsatile indicating patency. Image was recorded for the permanent record and under [name]-time visualization a microneedle was inserted into the common femoral artery followed by the microwire and then the micro-sheath. A J-wire was then advanced through the micro-sheath and a 5 [place] sheath was then inserted over a J-wire.  Subsequently a rim catheter with the stiff angle Glidewire was used to cross the aortic bifurcation the catheter wire were advanced down into the right distal external iliac artery. Oblique view of the femoral bifurcation was then obtained and subsequently the stiff angle wire was reintroduced and a 6 [place] Raby sheath was advanced up and over the bifurcation position with its tip in the proximal SFA.  5000 units of heparin was then given and allowed to circulate.  A EZ snare was then introduced into the common femoral from the femoral sheath  and the 0.035 advantage wire captured and pulled  extracorporeally.  Once enough wire had been fed a straight seeker catheter was then advanced in an antegrade direction down across the SFA popliteal occlusion and positioned in the posterior tibial artery.  Initially a 3 mm balloon and then a 4 mm balloon was used to angioplasty the SFA popliteal occlusion.  Catheter was then reintroduced and the 035 wire exchanged for a 0.018V 18 wire.  2 Viabahn stents a 6 x 150 and a 6 x 100 were then deployed covering the lesion in its entirety.  The Viabahn stents were then postdilated with a 6 mm balloon inflated to 18 atm proximally and in its mid segment and then a 5 mm balloon distally.  Follow-up imaging from the common femoral and them demonstrated wide patency of the SFA with successful recanalization of the occlusion.  There is a greater than 90% stenosis at the origin of the peroneal.  The posterior tibial is diseased but patent down to the ankle.  A 4 mm x 60 mm Lutonix balloon is used to treat the tibioperoneal trunk.  Inflation is to 8 atm for 1 minute.  Next the wire was then negotiated into the peroneal and a 2.5 mm x 40 mm Ultraverse used to angioplasty the proximal peroneal.  Distal runoff was then obtained by hand-injection showing less than 20% residual stenosis at the origin of the peroneal with preservation of the posterior tibial.  At this point having successfully recanalize the SFA and popliteal as well as recruiting to tibials for distal outflow I elected to terminate the procedure.    After review of these images the sheath is pulled into the left external iliac oblique of the common femoral is obtained and a Star close device deployed. There no immediate Complications.  Findings:  The right common femoral is heavily diseased but patent.  The SFA does indeed have an occlusion.  The tibioperoneal trunk also has hemodynamically significant stenosis.  There is a 90% stenosis at the origin of the peroneal there is diffuse disease within the  posterior tibial.  There is occlusion of the anterior tibial at its origin and throughout its entire course.  Following angioplasty peroneal is now widely patent.  The posterior tibial now is in-line flow and looks quite nice. Angioplasty and stent placement of the SFA at Hunter's canal and the popliteal yields an excellent result with less than 10% residual stenosis.  Summary: Successful recanalization right lower extremity for limb salvage   Disposition: Patient was taken to the recovery room in stable condition having tolerated the procedure well.  Scott [name] 08/04/2019,12:22 PM

## 2019-08-04 NOTE — Progress Notes (Signed)
   08/04/19 2018  Peritoneal Catheter Left lower abdomen Continuous ambulatory  Placement Date/Time: 04/07/17 1328   Procedural Verification: Medical records & consent reviewed  Time out: Correct Patient;Correct Site;Correct Procedure;Special equipment/requirements available  Person Inserting Catheter: Dr. [name]  Catheter Locat...  Site Assessment Clean;Dry;Intact  Drainage Description None  Catheter status Accessed;Patent  Dressing Gauze/Drain sponge  Dressing Status Clean;Dry;Intact  Dressing Intervention Dressing changed  Cycler Setup  Total Number of Exchanges 4  Fill Volume 2000  Dianeal Solution Dextrose 1.5% in 6000 mL  Fill Time - Minute(s) 10  Dwell Time - Hour(s) 10  Drain Time - Minute(s) 20 mins  Pause Option Status Initiated  Exit Site Care Performed Yes  pt resting

## 2019-08-04 NOTE — Progress Notes (Signed)
   08/04/19 2010  Neurological  Level of Consciousness Responds to Voice  Orientation Level Oriented to person (pt very drowsy)  Respiratory  Respiratory Pattern Regular;Unlabored  Bilateral Breath Sounds Clear  Cardiac  Pulse Regular  Heart Sounds S1, S2  stable for PD tx RN at [facility]

## 2019-08-04 NOTE — Progress Notes (Signed)
Progress Note    Scott [name]  Scott [name] DOB: 1961-08-24  DOA: 07/31/2019 PCP: [facility] [employer]      Brief Narrative:    Medical records reviewed and are as summarized below:  Scott [name] is an 58 y.o. male with medical history significant of peripheral arterial disease, diabetes mellitus type 2, ESRD on peritoneal dialysis presents to the hospital with complaints of right lower extremity pain.  Patient was admitted here last week for left lower extremity pain diagnosed with osteomyelitis requiring angiogram by vascular followed by  amputation of left second toe by podiatry.  He was eventually discharged on IV antibiotics.  He had been feeling better but started experiencing right lower extremity pain over the last couple of days.  Initially pain was with exertion but now has progressed to persistent and his toes have darkened.      Assessment/Plan:   Principal Problem:   Leg pain, right Active Problems:   Type 2 diabetes mellitus with other diabetic kidney complication (HCC)   GERD (gastroesophageal reflux disease)   ESRD (end stage renal disease) (HCC)   Tobacco abuse   Anemia in ESRD (end-stage renal disease) (HCC)   Gastro-esophageal reflux disease with esophagitis   Type II diabetes mellitus (HCC)   PVD (peripheral vascular disease) (HCC)   PAD (peripheral artery disease) (HCC)   Osteomyelitis of second toe of left foot (HCC)   Body mass index is 29.92 kg/m.   Severe peripheral vascular disease with right big toe gangrene: Continue IV heparin infusion and monitor PTT per protocol.  Analgesics as needed for pain.  Underwent angioplasty of the right profunda femoris artery. Repeat angioplasty performed with good results on 08/04/2019. We will discuss with podiatry regarding possible amputation which is scheduled on Tuesday.  Essential hypertension. Chronic systolic CHF Persistent hypotension Patient is on multiple antihypertensive  medication. Blood pressure currently remaining soft. Currently not on any antihypertensive medication. We will continue with Flomax for now. Patient was actually given 250 cc bolus on 08/03/2019.  Anemia of chronic disease:  Monitor H&H and transfuse as needed.  ESRD on peritoneal dialysis:  Follow-up with nephrologist for management.  Type 2 diabetes mellitus:  NovoLog as needed for hyperglycemia  CAD/chronic systolic CHF with EF of 35 to 40% in January 2020:  Continue fluid management with peritoneal dialysis.   Continue aspirin and Lipitor  Recent osteomyelitis of left foot: Continue antibiotics (ceftazidime, vancomycin and Bactrim) We will consult with podiatry tomorrow.   Family Communication/Anticipated D/C date and plan/Code Status   DVT prophylaxis: IV heparin infusion Code Status: Full code Family Communication: Plan discussed with the patient Disposition Plan: Physical therapy consulted.  Subjective:  Reports pain in his leg.  No nausea no vomiting no fever no chills.  No chest pain abdominal pain.  Objective:    Vitals:   08/04/19 1400 08/04/19 1440 08/04/19 1557 08/04/19 1927  BP: 91/70 (!) 107/34 (!) 107/94 101/73  Pulse: 75 76 76 76  Resp: 10 17  17   Temp:  (!) 97.5 F (36.4 C)  98.5 F (36.9 C)  TempSrc:  Oral  Oral  SpO2: 98% 96%  90%  Weight:      Height:        Intake/Output Summary (Last 24 hours) at 08/04/2019 1944 Last data filed at 08/04/2019 1706 Gross per 24 hour  Intake 913.55 ml  Output -  Net 913.55 ml   Filed Weights   08/01/19 0549 08/02/19 0424 08/04/19 6834  Weight: 94.5 kg 91.9 kg 91.9 kg    Exam:  General: alert and oriented to time, place, and person. Appear in mild distress, affect appropriate Eyes: PERRL, Conjunctiva normal ENT: Oral Mucosa Clear, moist  Neck: no JVD, no Abnormal Mass Or lumps Cardiovascular: S1 and S2 Present, no Murmur, peripheral pulses symmetrical Respiratory: good respiratory effort, Bilateral  Air entry equal and Decreased, no signs of accessory muscle use, Clear to Auscultation, no Crackles, no wheezes Abdomen: Bowel Sound present, Soft and no tenderness, no hernia Skin: no rashes  Extremities: trace Pedal edema, no calf tenderness Neurologic: without any new focal findings Gait not checked due to patient safety concerns    Data Reviewed:   I have personally reviewed following labs and imaging studies:  Labs: Labs show the following:   Basic Metabolic Panel: Recent Labs  Lab 07/31/19 0817 08/01/19 0519 08/02/19 0620 08/03/19 0559 08/04/19 0512  NA 136 138 138 139 138  K 4.6 4.4 4.2 4.4 4.2  CL 99 103 102 102 100  CO2 23 25 25 25 27   GLUCOSE 216* 128* 126* 121* 117*  BUN 60* 59* 55* 49* 44*  CREATININE 7.36* 7.21* 7.42* 7.32* 7.47*  CALCIUM 7.5* 7.8* 8.0* 8.3* 8.4*  MG  --   --   --  2.5*  --    GFR Estimated Creatinine Clearance: 11.8 mL/min (A) (by C-G formula based on SCr of 7.47 mg/dL (H)). Liver Function Tests: Recent Labs  Lab 07/31/19 0817 08/01/19 0519  AST 23 14*  ALT 19 14  ALKPHOS 110 91  BILITOT 0.8 0.7  PROT 6.4* 5.5*  ALBUMIN 2.6* 2.2*   No results for input(s): LIPASE, AMYLASE in the last 168 hours. No results for input(s): AMMONIA in the last 168 hours. Coagulation profile Recent Labs  Lab 07/31/19 0817 08/01/19 0519  INR 1.1 1.3*    CBC: Recent Labs  Lab 07/31/19 0817 08/01/19 0003 08/01/19 0842 08/02/19 0620 08/03/19 0559 08/04/19 0512  WBC 11.7* 9.6  --  9.7 9.8 10.5  NEUTROABS  --   --   --  7.4  --   --   HGB 8.4* 7.1* 7.4* 7.7* 7.7* 8.3*  HCT 26.9* 23.1*  --  24.6* 24.0* 25.9*  MCV 98.2 97.9  --  98.0 94.9 94.5  PLT 266 221  --  244 255 258   Cardiac Enzymes: No results for input(s): CKTOTAL, CKMB, CKMBINDEX, TROPONINI in the last 168 hours. BNP (last 3 results) No results for input(s): PROBNP in the last 8760 hours. CBG: Recent Labs  Lab 08/03/19 1637 08/03/19 2051 08/04/19 0833 08/04/19 1318  08/04/19 1658  GLUCAP 95 109* 103* 106* 159*   D-Dimer: No results for input(s): DDIMER in the last 72 hours. Hgb A1c: No results for input(s): HGBA1C in the last 72 hours. Lipid Profile: No results for input(s): CHOL, HDL, LDLCALC, TRIG, CHOLHDL, LDLDIRECT in the last 72 hours. Thyroid function studies: No results for input(s): TSH, T4TOTAL, T3FREE, THYROIDAB in the last 72 hours.  Invalid input(s): FREET3 Anemia work up: No results for input(s): VITAMINB12, FOLATE, FERRITIN, TIBC, IRON, RETICCTPCT in the last 72 hours. Sepsis Labs: Recent Labs  Lab 08/01/19 0003 08/02/19 0620 08/03/19 0559 08/04/19 0512  WBC 9.6 9.7 9.8 10.5    Microbiology Recent Results (from the past 240 hour(s))  SARS CORONAVIRUS 2 (TAT 6-24 HRS) Nasopharyngeal Nasopharyngeal Swab     Status: None   Collection Time: 07/28/19 11:24 AM   Specimen: Nasopharyngeal Swab  Result Value Ref  Range Status   SARS Coronavirus 2 NEGATIVE NEGATIVE Final    Comment: (NOTE) SARS-CoV-2 target nucleic acids are NOT DETECTED. The SARS-CoV-2 RNA is generally detectable in upper and lower respiratory specimens during the acute phase of infection. Negative results do not preclude SARS-CoV-2 infection, do not rule out co-infections with other pathogens, and should not be used as the sole basis for treatment or other patient management decisions. Negative results must be combined with clinical observations, patient history, and epidemiological information. The expected result is Negative. Fact Sheet for Patients: SugarRoll.be Fact Sheet for Healthcare Providers: https://www.woods-mathews.com/ This test is not yet approved or cleared by the [place] FDA and  has been authorized for detection and/or diagnosis of SARS-CoV-2 by FDA under an Emergency Use Authorization (EUA). This EUA will remain  in effect (meaning this test can be used) for the duration of the COVID-19  declaration under Section 56 4(b)(1) of the Act, 21 U.S.C. section 360bbb-3(b)(1), unless the authorization is terminated or revoked sooner. Performed at [facility], [place] [address], [place],  [address]     Procedures and diagnostic studies:  No results found.  Medications:   . aspirin EC  81 mg Oral Daily  . atorvastatin  40 mg Oral Daily  . calcitRIOL  0.25 mcg Oral Daily  . calcium acetate  2,001 mg Oral TID WC  . clopidogrel  75 mg Oral Q breakfast  . fentaNYL      . gabapentin  300 mg Oral QHS  . gentamicin cream  1 application Topical Daily  . heparin      . insulin aspart  0-5 Units Subcutaneous QHS  . insulin aspart  0-9 Units Subcutaneous TID WC  . insulin aspart  2 Units Subcutaneous TID WC  . midazolam      . pantoprazole  40 mg Oral Daily  . polyethylene glycol  17 g Oral BID  . senna-docusate  2 tablet Oral BID  . sulfamethoxazole-trimethoprim  1 tablet Oral Daily  . tamsulosin  0.4 mg Oral Daily  . vancomycin variable dose per unstable renal function (pharmacist dosing)   Does not apply See admin instructions   Continuous Infusions: . cefTAZidime (FORTAZ)  IV Stopped (08/04/19 1623)  . clindamycin    . dialysis solution 1.5% low-MG/low-CA    . heparin 1,500 Units/hr (08/04/19 1706)  . [START ON 08/05/2019] vancomycin       LOS: 4 days   Berle Mull  [facility]   *Please refer to [email or web address], password [email or web address] to get updated schedule on who will round on this patient, as hospitalists switch teams weekly. If 7PM-7AM, please contact night-coverage at [email or web address], password [email or web address] for any overnight needs.  08/04/2019, 7:44 PM

## 2019-08-04 NOTE — TOC Initial Note (Signed)
Transition of Care [facility]) - Initial/Assessment Note    Patient Details  Name: Scott [name] MRN: [id number] Date of Birth: [birth year]  [facility] of [facility]) CM/SW Contact:    Ross Ludwig, LCSW Phone Number: 08/04/2019, 5:47 PM  Clinical Narrative:                   Expected Discharge Plan: [place] Barriers to Discharge: Continued Medical Work up   Patient Goals and CMS Choice Patient states their goals for this hospitalization and ongoing recovery are:: To return home with home health CMS Medicare.gov Compare Post Acute Care list provided to:: Patient Choice offered to / list presented to : Patient  Expected Discharge Plan and Services Expected Discharge Plan: [place] In-house Referral: Clinical Social Work   Post Acute Care Choice: [place] arrangements for the past 2 months: Single Family Home                 DME Arranged: N/A DME Agency: NA       HH Arranged: RN, PT, OT, Nurse's Aide, Social Work [employer] Agency: [place] ([place]) Date [place]: 08/04/19 Time [place]: 67 Representative spoke with at [place]: [place] Arrangements/Services Living arrangements for the past 2 months: [place] Lives with:: Self Patient language and need for interpreter reviewed:: Yes Do you feel safe going back to the place where you live?: Yes      Need for Family Participation in Patient Care: No (Comment) Care giver support system in place?: Yes (comment) Current home services: Home RN Criminal Activity/Legal Involvement Pertinent to Current Situation/Hospitalization: No - Comment as needed  Activities of Daily Living Home Assistive Devices/Equipment: Other (Comment), Cane (specify quad or straight), Walker (specify type)(peritoneal dialysis ) ADL Screening (condition at time of admission) Patient's cognitive ability adequate to safely complete daily activities?:  Yes Is the patient deaf or have difficulty hearing?: No Does the patient have difficulty seeing, even when wearing glasses/contacts?: No Does the patient have difficulty concentrating, remembering, or making decisions?: No Patient able to express need for assistance with ADLs?: Yes Does the patient have difficulty dressing or bathing?: No Independently performs ADLs?: Yes (appropriate for developmental age) Does the patient have difficulty walking or climbing stairs?: No Weakness of Legs: Both Weakness of Arms/Hands: None  Permission Sought/Granted Permission sought to share information with : Family Supports Permission granted to share information with : Yes, Verbal Permission Granted     Permission granted to share info w AGENCY: Home Health agency        Emotional Assessment Appearance:: Appears stated age   Affect (typically observed): Accepting, Appropriate Orientation: : Oriented to Self, Oriented to Place, Oriented to  Time, Oriented to Situation Alcohol / Substance Use: Alcohol Use Psych Involvement: No (comment)  Admission diagnosis:  Right leg pain [M79.604] Dry gangrene (HCC) [I96] Ischemic leg pain [M79.606, I99.8] Patient Active Problem List   Diagnosis Date Noted  . Leg pain, right 07/31/2019  . PAD (peripheral artery disease) ([place]) 07/17/2019  . Osteomyelitis of second toe of left foot ([place]) 07/17/2019  . Type II diabetes mellitus ([place])   . PVD (peripheral vascular disease) ([place])   . Non-Q wave myocardial infarction ([place])   . Hypertension   . Coronary artery disease   . Ulcerated, foot, right, with necrosis of muscle ([place])   . Atherosclerosis of artery of extremity with ulceration ([place]) 07/03/2019  .  Anemia 04/15/2019  . Cellulitis 01/21/2019  . History of colonic polyps   . Positive fecal occult blood test   . 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 Atherosclerotic heart disease of native coronary  artery without angina pectoris 06/02/2017  . Encounter for screening for depression 06/02/2017  . Gastro-esophageal reflux disease with esophagitis 06/02/2017  . Other asthma 06/02/2017  . Other specified arthritis, unspecified site 06/02/2017  . Acidosis 05/31/2017  . Anemia in ESRD (end-stage renal disease) ([place]) 05/31/2017  . Hyperkalemia 05/31/2017  . Iron deficiency anemia, unspecified 05/31/2017  . Liver disease, unspecified 05/31/2017  . Moderate protein-calorie malnutrition ([place]) 05/31/2017  . Other dietary vitamin B12 deficiency anemia 05/31/2017  . Other disorders of electrolyte and fluid balance, not elsewhere classified 05/31/2017  . Other disorders resulting from impaired renal tubular function 05/31/2017  . Other hyperlipidemia 05/31/2017  . Other long term (current) drug therapy 05/31/2017  . Secondary hyperparathyroidism of renal origin ([place]) 05/31/2017  . Unspecified jaundice 05/31/2017  . ESRD (end stage renal disease) ([place]) 11/25/2016  . Tobacco abuse 11/25/2016  . Non-ST elevation (NSTEMI) myocardial infarction ([place]) 10/21/2016  . Pure hypercholesterolemia 10/21/2016  . Type 2 diabetes mellitus with other diabetic kidney complication ([place]) 17/71/1657  . GERD (gastroesophageal reflux disease) 04/04/2015   PCP:  [facility], [place]:   [place] [place] [address] Phone: [phone] Fax: [phone]     Social Determinants of Health (SDOH) Interventions    Readmission Risk Interventions Readmission Risk Prevention Plan 08/04/2019 07/19/2019 07/19/2019  Transportation Screening Complete Complete Complete  Medication Review Press photographer) Referral to Pharmacy Complete Complete  [place] or Home Care Consult Complete Complete Complete  SW Recovery Care/Counseling Consult Complete - -  Palliative Care Screening Not Applicable Not Applicable -  [place] Not  Applicable Not Applicable -  Some recent data might be hidden

## 2019-08-04 NOTE — Progress Notes (Signed)
[facility]  ROUNDING NOTE   Subjective:   Peritoneal dialysis treatment last night. Tolerated treatment well. UF of 684mL.   Objective:  Vital signs in last 24 hours:  Temp:  [97.3 F (36.3 C)-97.8 F (36.6 C)] 97.8 F (36.6 C) (12/04 0934) Pulse Rate:  [66-72] 72 (12/04 1100) Resp:  [13-20] 16 (12/04 1100) BP: (90-121)/(29-72) 107/50 (12/04 1100) SpO2:  [90 %-100 %] 100 % (12/04 1100) Weight:  [91.9 kg] 91.9 kg (12/04 0934)  Weight change:  Filed Weights   08/01/19 0549 08/02/19 0424 08/04/19 0934  Weight: 94.5 kg 91.9 kg 91.9 kg    Intake/Output: I/O last 3 completed shifts: In: 500.3 [I.V.:400.3; IV Piggyback:100] Out: 847 [Other:847]   Intake/Output this shift:  No intake/output data recorded.  Physical Exam: General: NAD   Head: Normocephalic, atraumatic. Moist oral mucosal membranes  Eyes: Anicteric, PERRL  Neck: Supple, trachea midline  Lungs:  Clear to auscultation  Heart: Regular rate and rhythm  Abdomen:  Soft, nontender,   Extremities:  Left foot in clean and dry dressings Right foot +erythema and +edema  Neurologic: Nonfocal, moving all four extremities  Skin: No lesions  Access: PD catheter    Basic Metabolic Panel: Recent Labs  Lab 07/31/19 0817 08/01/19 0519 08/02/19 0620 08/03/19 0559 08/04/19 0512  NA 136 138 138 139 138  K 4.6 4.4 4.2 4.4 4.2  CL 99 103 102 102 100  CO2 23 25 25 25 27   GLUCOSE 216* 128* 126* 121* 117*  BUN 60* 59* 55* 49* 44*  CREATININE 7.36* 7.21* 7.42* 7.32* 7.47*  CALCIUM 7.5* 7.8* 8.0* 8.3* 8.4*  MG  --   --   --  2.5*  --     Liver Function Tests: Recent Labs  Lab 07/31/19 0817 08/01/19 0519  AST 23 14*  ALT 19 14  ALKPHOS 110 91  BILITOT 0.8 0.7  PROT 6.4* 5.5*  ALBUMIN 2.6* 2.2*   No results for input(s): LIPASE, AMYLASE in the last 168 hours. No results for input(s): AMMONIA in the last 168 hours.  CBC: Recent Labs  Lab 07/31/19 0817 08/01/19 0003 08/01/19 0842  08/02/19 0620 08/03/19 0559 08/04/19 0512  WBC 11.7* 9.6  --  9.7 9.8 10.5  NEUTROABS  --   --   --  7.4  --   --   HGB 8.4* 7.1* 7.4* 7.7* 7.7* 8.3*  HCT 26.9* 23.1*  --  24.6* 24.0* 25.9*  MCV 98.2 97.9  --  98.0 94.9 94.5  PLT 266 221  --  244 255 258    Cardiac Enzymes: No results for input(s): CKTOTAL, CKMB, CKMBINDEX, TROPONINI in the last 168 hours.  BNP: Invalid input(s): POCBNP  CBG: Recent Labs  Lab 08/03/19 0843 08/03/19 1156 08/03/19 1637 08/03/19 2051 08/04/19 0833  GLUCAP 117* 122* 95 109* 103*    Microbiology: Results for orders placed or performed during the hospital encounter of 07/28/19  SARS CORONAVIRUS 2 (TAT 6-24 HRS) Nasopharyngeal Nasopharyngeal Swab     Status: None   Collection Time: 07/28/19 11:24 AM   Specimen: Nasopharyngeal Swab  Result Value Ref Range Status   SARS Coronavirus 2 NEGATIVE NEGATIVE Final    Comment: (NOTE) SARS-CoV-2 target nucleic acids are NOT DETECTED. The SARS-CoV-2 RNA is generally detectable in upper and lower respiratory specimens during the acute phase of infection. Negative results do not preclude SARS-CoV-2 infection, do not rule out co-infections with other pathogens, and should not be used as the sole basis for treatment  or other patient management decisions. Negative results must be combined with clinical observations, patient history, and epidemiological information. The expected result is Negative. Fact Sheet for Patients: SugarRoll.be Fact Sheet for Healthcare Providers: https://www.woods-mathews.com/ This test is not yet approved or cleared by the [place] FDA and  has been authorized for detection and/or diagnosis of SARS-CoV-2 by FDA under an Emergency Use Authorization (EUA). This EUA will remain  in effect (meaning this test can be used) for the duration of the COVID-19 declaration under Section 56 4(b)(1) of the Act, 21 U.S.C. section 360bbb-3(b)(1),  unless the authorization is terminated or revoked sooner. Performed at [facility], [place] [address], [place], [place] [address]     Coagulation Studies: No results for input(s): LABPROT, INR in the last 72 hours.  Urinalysis: No results for input(s): COLORURINE, LABSPEC, PHURINE, GLUCOSEU, HGBUR, BILIRUBINUR, KETONESUR, PROTEINUR, UROBILINOGEN, NITRITE, LEUKOCYTESUR in the last 72 hours.  Invalid input(s): APPERANCEUR    Imaging: No results found.   Medications:   . [MAR Hold] sodium chloride    . sodium chloride 125 mL/hr at 08/04/19 1030  . [MAR Hold] cefTAZidime (FORTAZ)  IV 1 g (08/03/19 1750)  . clindamycin    . [MAR Hold] dialysis solution 1.5% low-MG/low-CA    . heparin Stopped (08/04/19 0945)   . [MAR Hold] aspirin EC  81 mg Oral Daily  . [MAR Hold] atorvastatin  40 mg Oral Daily  . [MAR Hold] calcitRIOL  0.25 mcg Oral Daily  . [MAR Hold] calcium acetate  2,001 mg Oral TID WC  . [MAR Hold] clopidogrel  75 mg Oral Q breakfast  . fentaNYL      . [MAR Hold] gabapentin  300 mg Oral QHS  . [MAR Hold] gentamicin cream  1 application Topical Daily  . heparin      . [MAR Hold] insulin aspart  0-5 Units Subcutaneous QHS  . [MAR Hold] insulin aspart  0-9 Units Subcutaneous TID WC  . [MAR Hold] insulin aspart  2 Units Subcutaneous TID WC  . midazolam      . [MAR Hold] pantoprazole  40 mg Oral Daily  . [MAR Hold] polyethylene glycol  17 g Oral BID  . [MAR Hold] senna-docusate  2 tablet Oral BID  . [MAR Hold] sodium chloride flush  3 mL Intravenous Q12H  . [MAR Hold] sulfamethoxazole-trimethoprim  1 tablet Oral Daily  . [MAR Hold] tamsulosin  0.4 mg Oral Daily  . [MAR Hold] vancomycin variable dose per unstable renal function (pharmacist dosing)   Does not apply See admin instructions   [MAR Hold] sodium chloride, [MAR Hold] acetaminophen **OR** [MAR Hold] acetaminophen, [MAR Hold] acetaminophen, [MAR Hold] albuterol, [MAR Hold] bisacodyl, [MAR Hold] calcium  acetate, diphenhydrAMINE, famotidine, fentaNYL, heparin, [MAR Hold] HYDROcodone-acetaminophen, HYDROmorphone (DILAUDID) injection, [MAR Hold] methocarbamol, methylPREDNISolone (SOLU-MEDROL) injection, midazolam, midazolam, [MAR Hold]  morphine injection, [MAR Hold] ondansetron **OR** [MAR Hold] ondansetron (ZOFRAN) IV, ondansetron (ZOFRAN) IV, [MAR Hold] sodium chloride flush, [MAR Hold] traMADol  Assessment/ Plan:  [name] is a 58 y.o. white male with end stage renal disease on peritoneal dialysis, hypertension, diabetes mellitus type II, peripheral vascular disease, left toe amputation, who is admitted to [facility] on 07/31/2019 for Right leg pain [M79.604] Dry gangrene ([place]) [I96] Ischemic leg pain [M79.606, I99.8]  [facility] PD CAPD 4 exchanges 2021mL fills  1. End Stage Renal Disease: peritoneal dialysis for tonight. CCPD 10 hours 4 exchanges 2 liter fills - 1.5% dextrose  2. Peripheral vascular disease:  left lower extremity healing well. However now with erythema and ischemic changes to his right first toe.  Discharged on IP ceftazidime and vancomycin. He was administrating this at home.  - TMP/SMX, vanco, ceftazidime.  - Appreciate ID and vascular input.  Scheduled for repeat angiogram today.   3. Hypertension:  - carvedilol, hydralazine and isosorbide mononitrate  4. Anemia of chronic kidney disease: hemoglobin 8.3. Holding epo due to ischemia.  - Aranesp as outpatient.   5. Secondary Hyperparathyroidism with hypocalcemia.   - calcitriol - calcium acetate with meals.    LOS: 4 Mairin Lindsley 12/4/202011:05 AM

## 2019-08-04 NOTE — Progress Notes (Signed)
ID  Pt back from angio Says leg hurt Underwent Ultrasound-guided access to the right posterior tibial artery  Percutaneous transluminal angioplasty and stent placement right superficial femoral and popliteal arteries to 6 mm with Viabahn stents.. Percutaneous transluminal angioplasty right peroneal artery  O/e awake and alert BP 101/73 (BP Location: Left Arm)   Pulse 76   Temp 98.5 F (36.9 C) (Oral)   Resp 17   Ht 5\' 9"  (1.753 m)   Wt 91.9 kg   SpO2 90%   BMI 29.92 kg/m   Chest B/l air entry Hs s1s2 Abd PD catheter Rt leg- puncture site has some oozing Which has been contained with pressure dressing  Left leg dressing not removed  Labs CBC Latest Ref Rng & Units 08/04/2019 08/03/2019 08/02/2019  WBC 4.0 - 10.5 K/uL 10.5 9.8 9.7  Hemoglobin 13.0 - 17.0 g/dL 8.3(L) 7.7(L) 7.7(L)  Hematocrit 39.0 - 52.0 % 25.9(L) 24.0(L) 24.6(L)  Platelets 150 - 400 K/uL 258 255 244    CMP Latest Ref Rng & Units 08/04/2019 08/03/2019 08/02/2019  Glucose 70 - 99 mg/dL 117(H) 121(H) 126(H)  BUN 6 - 20 mg/dL 44(H) 49(H) 55(H)  Creatinine 0.61 - 1.24 mg/dL 7.47(H) 7.32(H) 7.42(H)  Sodium 135 - 145 mmol/L 138 139 138  Potassium 3.5 - 5.1 mmol/L 4.2 4.4 4.2  Chloride 98 - 111 mmol/L 100 102 102  CO2 22 - 32 mmol/L 27 25 25   Calcium 8.9 - 10.3 mg/dL 8.4(L) 8.3(L) 8.0(L)  Total Protein 6.5 - 8.1 g/dL - - -  Total Bilirubin 0.3 - 1.2 mg/dL - - -  Alkaline Phos 38 - 126 U/L - - -  AST 15 - 41 U/L - - -  ALT 0 - 44 U/L - - -    Impression/Recommendation  PAD- s/p Percutaneous transluminal angioplasty and stent placement right superficial femoral and popliteal arteries to 6 mm with Viabahn stents.. Percutaneous transluminal angioplasty right peroneal artery  Rt great toe gangrene  Left foot gngrene s/p rt great toe and second ray excision- wound healing well On vanco/cefepime + bactrim until 08/18/19  ESRD on PD  DM - management as per primary team  HLD on atorvastatin  CAD  ID will  follow him peripherally this weekend

## 2019-08-05 ENCOUNTER — Inpatient Hospital Stay: Payer: Medicare Other

## 2019-08-05 LAB — CBC
HCT: 24 % — ABNORMAL LOW (ref 39.0–52.0)
HCT: 23.2 % — ABNORMAL LOW (ref 39.0–52.0)
Hemoglobin: 7.7 g/dL — ABNORMAL LOW (ref 13.0–17.0)
Hemoglobin: 7.4 g/dL — ABNORMAL LOW (ref 13.0–17.0)
MCH: 30.3 pg (ref 26.0–34.0)
MCH: 30.3 pg (ref 26.0–34.0)
MCHC: 32.1 g/dL (ref 30.0–36.0)
MCHC: 31.9 g/dL (ref 30.0–36.0)
MCV: 94.5 fL (ref 80.0–100.0)
MCV: 95.1 fL (ref 80.0–100.0)
Platelets: 232 10*3/uL (ref 150–400)
Platelets: 215 10*3/uL (ref 150–400)
RBC: 2.54 MIL/uL — ABNORMAL LOW (ref 4.22–5.81)
RBC: 2.44 MIL/uL — ABNORMAL LOW (ref 4.22–5.81)
RDW: 16.4 % — ABNORMAL HIGH (ref 11.5–15.5)
RDW: 16.4 % — ABNORMAL HIGH (ref 11.5–15.5)
WBC: 12.8 10*3/uL — ABNORMAL HIGH (ref 4.0–10.5)
WBC: 12 10*3/uL — ABNORMAL HIGH (ref 4.0–10.5)
nRBC: 0 % (ref 0.0–0.2)
nRBC: 0 % (ref 0.0–0.2)

## 2019-08-05 LAB — COMPREHENSIVE METABOLIC PANEL
ALT: 10 U/L (ref 0–44)
AST: 20 U/L (ref 15–41)
Albumin: 2.1 g/dL — ABNORMAL LOW (ref 3.5–5.0)
Alkaline Phosphatase: 92 U/L (ref 38–126)
Anion gap: 10 (ref 5–15)
BUN: 53 mg/dL — ABNORMAL HIGH (ref 6–20)
CO2: 24 mmol/L (ref 22–32)
Calcium: 7.8 mg/dL — ABNORMAL LOW (ref 8.9–10.3)
Chloride: 101 mmol/L (ref 98–111)
Creatinine, Ser: 8.57 mg/dL — ABNORMAL HIGH (ref 0.61–1.24)
GFR calc Af Amer: 7 mL/min — ABNORMAL LOW (ref >60)
GFR calc non Af Amer: 6 mL/min — ABNORMAL LOW (ref >60)
Glucose, Bld: 105 mg/dL — ABNORMAL HIGH (ref 70–99)
Potassium: 4.8 mmol/L (ref 3.5–5.1)
Sodium: 135 mmol/L (ref 135–145)
Total Bilirubin: 0.6 mg/dL (ref 0.3–1.2)
Total Protein: 5.4 g/dL — ABNORMAL LOW (ref 6.5–8.1)

## 2019-08-05 LAB — GLUCOSE, CAPILLARY
Glucose-Capillary: 111 mg/dL — ABNORMAL HIGH (ref 70–99)
Glucose-Capillary: 109 mg/dL — ABNORMAL HIGH (ref 70–99)
Glucose-Capillary: 181 mg/dL — ABNORMAL HIGH (ref 70–99)
Glucose-Capillary: 89 mg/dL (ref 70–99)
Glucose-Capillary: 155 mg/dL — ABNORMAL HIGH (ref 70–99)

## 2019-08-05 LAB — LACTIC ACID, PLASMA: Lactic Acid, Venous: 0.9 mmol/L (ref 0.5–1.9)

## 2019-08-05 LAB — BASIC METABOLIC PANEL
Anion gap: 12 (ref 5–15)
BUN: 46 mg/dL — ABNORMAL HIGH (ref 6–20)
CO2: 24 mmol/L (ref 22–32)
Calcium: 8 mg/dL — ABNORMAL LOW (ref 8.9–10.3)
Chloride: 102 mmol/L (ref 98–111)
Creatinine, Ser: 7.8 mg/dL — ABNORMAL HIGH (ref 0.61–1.24)
GFR calc Af Amer: 8 mL/min — ABNORMAL LOW (ref >60)
GFR calc non Af Amer: 7 mL/min — ABNORMAL LOW (ref >60)
Glucose, Bld: 107 mg/dL — ABNORMAL HIGH (ref 70–99)
Potassium: 4.5 mmol/L (ref 3.5–5.1)
Sodium: 138 mmol/L (ref 135–145)

## 2019-08-05 LAB — LIPASE, BLOOD: Lipase: 22 U/L (ref 11–51)

## 2019-08-05 LAB — HEPARIN LEVEL (UNFRACTIONATED): Heparin Unfractionated: 0.41 IU/mL (ref 0.30–0.70)

## 2019-08-05 MED ORDER — DARBEPOETIN ALFA 40 MCG/0.4ML IJ SOSY
40.0000 ug | PREFILLED_SYRINGE | INTRAMUSCULAR | Status: DC
  Administered 2019-08-05: 40 ug via SUBCUTANEOUS
  Filled 2019-08-05 (×2): qty 0.4

## 2019-08-05 NOTE — Progress Notes (Signed)
[facility]  ROUNDING NOTE   Subjective:   Peritoneal dialysis treatment last night. Tolerated treatment well. UF of 671mL. 1.5% dextrose  Objective:  Vital signs in last 24 hours:  Temp:  [97.5 F (36.4 C)-99 F (37.2 C)] 98 F (36.7 C) (12/05 0734) Pulse Rate:  [66-78] 73 (12/05 0734) Resp:  [10-19] 17 (12/05 0349) BP: (86-126)/(34-94) 107/44 (12/05 0734) SpO2:  [90 %-100 %] 100 % (12/05 0734)  Weight change:  Filed Weights   08/01/19 0549 08/02/19 0424 08/04/19 0934  Weight: 94.5 kg 91.9 kg 91.9 kg    Intake/Output: I/O last 3 completed shifts: In: 1074.4 [I.V.:874.4; IV Piggyback:200] Out: 643 [Other:643]   Intake/Output this shift:  Total I/O In: -  Out: 644 [Other:644]  Physical Exam: General: NAD   Head: Normocephalic, atraumatic. Moist oral mucosal membranes  Eyes: Anicteric, PERRL  Neck: Supple, trachea midline  Lungs:  Clear to auscultation  Heart: Regular rate and rhythm  Abdomen:  Soft, nontender,   Extremities:  Left foot in clean and dry dressings Right foot +erythema and +edema  Neurologic: Nonfocal, moving all four extremities  Skin: No lesions  Access: PD catheter    Basic Metabolic Panel: Recent Labs  Lab 08/01/19 0519 08/02/19 0620 08/03/19 0559 08/04/19 0512 08/05/19 0648  NA 138 138 139 138 138  K 4.4 4.2 4.4 4.2 4.5  CL 103 102 102 100 102  CO2 25 25 25 27 24   GLUCOSE 128* 126* 121* 117* 107*  BUN 59* 55* 49* 44* 46*  CREATININE 7.21* 7.42* 7.32* 7.47* 7.80*  CALCIUM 7.8* 8.0* 8.3* 8.4* 8.0*  MG  --   --  2.5*  --   --     Liver Function Tests: Recent Labs  Lab 07/31/19 0817 08/01/19 0519  AST 23 14*  ALT 19 14  ALKPHOS 110 91  BILITOT 0.8 0.7  PROT 6.4* 5.5*  ALBUMIN 2.6* 2.2*   No results for input(s): LIPASE, AMYLASE in the last 168 hours. No results for input(s): AMMONIA in the last 168 hours.  CBC: Recent Labs  Lab 08/01/19 0003 08/01/19 0842 08/02/19 0620 08/03/19 0559 08/04/19 0512  08/05/19 0648  WBC 9.6  --  9.7 9.8 10.5 12.8*  NEUTROABS  --   --  7.4  --   --   --   HGB 7.1* 7.4* 7.7* 7.7* 8.3* 7.7*  HCT 23.1*  --  24.6* 24.0* 25.9* 24.0*  MCV 97.9  --  98.0 94.9 94.5 94.5  PLT 221  --  244 255 258 232    Cardiac Enzymes: No results for input(s): CKTOTAL, CKMB, CKMBINDEX, TROPONINI in the last 168 hours.  BNP: Invalid input(s): POCBNP  CBG: Recent Labs  Lab 08/04/19 1318 08/04/19 1658 08/04/19 2032 08/05/19 0600 08/05/19 0754  GLUCAP 106* 159* 82 111* 109*    Microbiology: Results for orders placed or performed during the hospital encounter of 07/28/19  SARS CORONAVIRUS 2 (TAT 6-24 HRS) Nasopharyngeal Nasopharyngeal Swab     Status: None   Collection Time: 07/28/19 11:24 AM   Specimen: Nasopharyngeal Swab  Result Value Ref Range Status   SARS Coronavirus 2 NEGATIVE NEGATIVE Final    Comment: (NOTE) SARS-CoV-2 target nucleic acids are NOT DETECTED. The SARS-CoV-2 RNA is generally detectable in upper and lower respiratory specimens during the acute phase of infection. Negative results do not preclude SARS-CoV-2 infection, do not rule out co-infections with other pathogens, and should not be used as the sole basis for treatment or other patient  management decisions. Negative results must be combined with clinical observations, patient history, and epidemiological information. The expected result is Negative. Fact Sheet for Patients: SugarRoll.be Fact Sheet for Healthcare Providers: https://www.woods-mathews.com/ This test is not yet approved or cleared by the [place] FDA and  has been authorized for detection and/or diagnosis of SARS-CoV-2 by FDA under an Emergency Use Authorization (EUA). This EUA will remain  in effect (meaning this test can be used) for the duration of the COVID-19 declaration under Section 56 4(b)(1) of the Act, 21 U.S.C. section 360bbb-3(b)(1), unless the authorization is  terminated or revoked sooner. Performed at [facility], [place] [address] [place], [place] [address]     Coagulation Studies: No results for input(s): LABPROT, INR in the last 72 hours.  Urinalysis: No results for input(s): COLORURINE, LABSPEC, PHURINE, GLUCOSEU, HGBUR, BILIRUBINUR, KETONESUR, PROTEINUR, UROBILINOGEN, NITRITE, LEUKOCYTESUR in the last 72 hours.  Invalid input(s): APPERANCEUR    Imaging: No results found.   Medications:   . cefTAZidime (FORTAZ)  IV Stopped (08/04/19 1623)  . dialysis solution 1.5% low-MG/low-CA    . vancomycin     . aspirin EC  81 mg Oral Daily  . atorvastatin  40 mg Oral Daily  . calcitRIOL  0.25 mcg Oral Daily  . calcium acetate  2,001 mg Oral TID WC  . clopidogrel  75 mg Oral Q breakfast  . gabapentin  300 mg Oral QHS  . gentamicin cream  1 application Topical Daily  . insulin aspart  0-5 Units Subcutaneous QHS  . insulin aspart  0-9 Units Subcutaneous TID WC  . insulin aspart  2 Units Subcutaneous TID WC  . pantoprazole  40 mg Oral Daily  . polyethylene glycol  17 g Oral BID  . senna-docusate  2 tablet Oral BID  . sulfamethoxazole-trimethoprim  1 tablet Oral Daily  . tamsulosin  0.4 mg Oral Daily  . vancomycin variable dose per unstable renal function (pharmacist dosing)   Does not apply See admin instructions   acetaminophen **OR** acetaminophen, albuterol, bisacodyl, calcium acetate, methocarbamol, morphine injection, ondansetron **OR** ondansetron (ZOFRAN) IV, oxyCODONE  Assessment/ Plan:  [name] is a 58 y.o. white male with end stage renal disease on peritoneal dialysis, hypertension, diabetes mellitus type II, peripheral vascular disease, left toe amputation, who is admitted to [facility] on 07/31/2019 for Right leg pain [M79.604] Dry gangrene ([place]) [I96] Ischemic leg pain [M79.606, I99.8]  [place] PD CAPD 4 exchanges 2057mL fills  1. End Stage Renal Disease: peritoneal dialysis for tonight.  CCPD 10 hours 4 exchanges 2 liter fills - 1.5% dextrose  2. Peripheral vascular disease: left lower extremity healing well. However now with erythema and ischemic changes to his right first toe.  Discharged on IP ceftazidime and vancomycin. He was administrating this at home.  - TMP/SMX, vanco, ceftazidime.  - Appreciate ID and vascular input.     3. Hypertension:  - carvedilol, hydralazine and isosorbide mononitrate  4. Anemia of chronic kidney disease: hemoglobin 7.7.   - Aranesp   5. Secondary Hyperparathyroidism with hypocalcemia.   - calcitriol - calcium acetate with meals.    LOS: 5 Kwasi Joung 12/5/202011:15 AM

## 2019-08-05 NOTE — Consult Note (Signed)
ORTHOPAEDIC CONSULTATION  REQUESTING PHYSICIAN: Lavina Hamman, MD  Chief Complaint: Follow-up left foot debridement and gangrene right great toe.  HPI: Scott [name] is a 58 y.o. male who complains of bilateral lower extremity ulcer changes with gangrene of the right great toe and recent debridement of the left 1st ray and 2nd toe region.  He underwent revascularization of the right lower extremity yesterday.  Past Medical History:  Diagnosis Date  . Acute respiratory failure with hypoxia ([place]) 09/17/2018  . Arthritis    "left arm; right leg" (12/13/2014)  . Asthma   . CHF (congestive heart failure) ([place])   . Chronic disease anemia    [name] 12/13/2014  . Chronic kidney disease (CKD), stage IV (severe) ([place])    [name] 12/13/2014... on dialysis  . Complication of anesthesia    unable to urinate after CAPD urgery  . Continuous ambulatory peritoneal dialysis status ([place])   . Coronary artery disease    [name] 12/13/2014  . Depression   . Dysrhythmia    patient unaware of irregular heartbeat  . GERD (gastroesophageal reflux disease)   . High cholesterol    [name] 12/13/2014  . Hypertension   . Non-Q wave myocardial infarction ([place])    [name] 12/13/2014  . PVD (peripheral vascular disease) ([place])    [name] 12/13/2014  . Type II diabetes mellitus ([place])    Past Surgical History:  Procedure Laterality Date  . AMPUTATION TOE Left 05/24/2019   Procedure: Left great toe amputation;  Surgeon: Caroline More, DPM;  Location: [facility];  Service: Podiatry;  Laterality: Left;  . AMPUTATION TOE Left 07/19/2019   Procedure: AMPUTATION 2nd TOE PARTIAL RAY RESECTION;  Surgeon: Sharlotte Alamo, DPM;  Location: [facility];  Service: Podiatry;  Laterality: Left;  . AV FISTULA PLACEMENT Right 04/07/2017   Procedure: ARTERIOVENOUS (AV) FISTULA CREATION ( BRACHIALCEPHALIC );  Surgeon: Katha Cabal, MD;  Location: [facility];  Service: Vascular;  Laterality: Right;  . CAPD INSERTION N/A 04/07/2017   Procedure: LAPAROSCOPIC INSERTION CONTINUOUS AMBULATORY PERITONEAL DIALYSIS  (CAPD) CATHETER;  Surgeon: Katha Cabal, MD;  Location: [facility];  Service: Vascular;  Laterality: N/A;  . CARDIAC CATHETERIZATION  11/2014  . COLONOSCOPY WITH PROPOFOL N/A 12/01/2017   Procedure: COLONOSCOPY WITH PROPOFOL;  Surgeon: Lin Landsman, MD;  Location: [place];  Service: Endoscopy;  Laterality: N/A;  Diabetic - insulin  . COLONOSCOPY WITH PROPOFOL N/A 12/29/2017   Procedure: COLONOSCOPY WITH PROPOFOL;  Surgeon: Lin Landsman, MD;  Location: [place];  Service: Endoscopy;  Laterality: N/A;  . DIALYSIS/PERMA CATHETER INSERTION N/A 01/18/2017   Procedure: Dialysis/Perma Catheter Insertion;  Surgeon: Algernon Huxley, MD;  Location: [place] [facility];  Service: Cardiovascular;  Laterality: N/A;  . DIALYSIS/PERMA CATHETER REMOVAL N/A 07/26/2017   Procedure: DIALYSIS/PERMA CATHETER REMOVAL;  Surgeon: Algernon Huxley, MD;  Location: [place] [facility];  Service: Cardiovascular;  Laterality: N/A;  . INCISION AND DRAINAGE OF WOUND Right ~ 10/2014   "4th toe foot"  . IRRIGATION AND DEBRIDEMENT FOOT Left 07/19/2019   Procedure: IRRIGATION AND DEBRIDEMENT FOOT;  Surgeon: Sharlotte Alamo, DPM;  Location: [facility];  Service: Podiatry;  Laterality: Left;  . LOWER EXTREMITY ANGIOGRAPHY Left 05/23/2019   Procedure: Lower Extremity Angiography;  Surgeon: Katha Cabal, MD;  Location: [place] [facility];  Service: Cardiovascular;  Laterality: Left;  . LOWER EXTREMITY ANGIOGRAPHY Left 07/18/2019   Procedure: LOWER EXTREMITY ANGIOGRAPHY;  Surgeon: Katha Cabal, MD;  Location: [facility] INVASIVE CV  LAB;  Service: Cardiovascular;  Laterality: Left;  . LOWER EXTREMITY ANGIOGRAPHY Right 08/01/2019   Procedure: LOWER EXTREMITY ANGIOGRAPHY;  Surgeon: Katha Cabal, MD;  Location: [place] [facility];  Service: Cardiovascular;  Laterality: Right;  . LOWER EXTREMITY ANGIOGRAPHY Right 08/04/2019    Procedure: Lower Extremity Angiography (Pedal Approach);  Surgeon: Katha Cabal, MD;  Location: [place] [facility];  Service: Cardiovascular;  Laterality: Right;  . PERCUTANEOUS CORONARY STENT INTERVENTION (PCI-S) N/A 12/14/2014   Procedure: PERCUTANEOUS CORONARY STENT INTERVENTION (PCI-S);  Surgeon: Charolette Forward, MD;  Location: [facility] CATH LAB;  Service: Cardiovascular;  Laterality: N/A;  . PERCUTANEOUS CORONARY STENT INTERVENTION (PCI-S) N/A 12/18/2014   Procedure: PERCUTANEOUS CORONARY STENT INTERVENTION (PCI-S);  Surgeon: Charolette Forward, MD;  Location: [facility] CATH LAB;  Service: Cardiovascular;  Laterality: N/A;  . POLYPECTOMY  12/01/2017   Procedure: POLYPECTOMY;  Surgeon: Lin Landsman, MD;  Location: [place];  Service: Endoscopy;;  . POLYPECTOMY  12/29/2017   Procedure: POLYPECTOMY;  Surgeon: Lin Landsman, MD;  Location: [place];  Service: Endoscopy;;  . TOE AMPUTATION Right 2015   4th toe   Social History   Socioeconomic History  . Marital status: Significant Other    Spouse name: Not on file  . Number of children: Not on file  . Years of education: Not on file  . Highest education level: Not on file  Occupational History  . Not on file  Social Needs  . Financial resource strain: Not on file  . Food insecurity    Worry: Not on file    Inability: Not on file  . Transportation needs    Medical: Not on file    Non-medical: Not on file  Tobacco Use  . Smoking status: Former Smoker    Packs/day: 1.00    Years: 30.00    Pack years: 30.00    Types: Cigarettes    Start date: 08/31/1984    Quit date: 11/30/2014    Years since quitting: 4.6  . Smokeless tobacco: Never Used  Substance and Sexual Activity  . Alcohol use: Not Currently  . Drug use: No  . Sexual activity: Not Currently  Lifestyle  . Physical activity    Days per week: Not on file    Minutes per session: Not on file  . Stress: Not on file  Relationships  . Social Product manager on phone: Not on file    Gets together: Not on file    Attends religious service: Not on file    Active member of club or organization: Not on file    Attends meetings of clubs or organizations: Not on file    Relationship status: Not on file  Other Topics Concern  . Not on file  Social History Narrative   Lives at home with girlfriend and son   Family History  Problem Relation Age of Onset  . Cancer Mother        Lung  . Cancer Father        Lung  . Diabetes Sister   . Diabetes Brother   . CAD Brother   . Hernia Son   . Cancer Brother    No Known Allergies Prior to Admission medications   Medication Sig Start Date End Date Taking? Authorizing Provider  aspirin EC 81 MG tablet Take 1 tablet (81 mg total) by mouth daily. 07/21/19  Yes Ivor Costa, MD  atorvastatin (LIPITOR) 40 MG tablet Take 40 mg by mouth  daily. 05/15/19  Yes [provider]  B Complex-C-Folic Acid (DIALYVITE 476) 0.8 MG TABS Take 1 tablet by mouth daily. 10/25/17  Yes [provider]  calcitRIOL (ROCALTROL) 0.25 MCG capsule Take 0.25 mcg by mouth daily.   Yes [provider]  calcium acetate (PHOSLO) 667 MG capsule Take 667-2,001 mg by mouth See admin instructions. Take 2,001 mg by mouth 3 times daily with meals and [phone] mg with snacks 06/08/17  Yes [provider]  carvedilol (COREG) 25 MG tablet Take 1 tablet (25 mg total) by mouth 2 (two) times daily with a meal. 08/30/17  Yes Darylene Price A, FNP  cefTAZidime (FORTAZ) IVPB Inject 1 g into the vein daily for 14 days. Indication:  cellulitis Last Day of Therapy:  08/07/19 Please Administer this Antibiotic Intraperitoneally 07/24/19 08/07/19 Yes Ivor Costa, MD  Cholecalciferol (VITAMIN D) 2000 units tablet Take 2,000 Units by mouth daily.    Yes [provider]  furosemide (LASIX) 80 MG tablet Take 80 mg by mouth daily. 12/01/18  Yes [provider]  hydrALAZINE (APRESOLINE) 25 MG tablet Take 1 tablet (25  mg total) by mouth 3 (three) times daily. Patient taking differently: Take 25 mg by mouth daily as needed (148 or higher).  07/21/19 10/19/19 Yes Ivor Costa, MD  insulin aspart protamine- aspart (NOVOLOG MIX 70/30) (70-30) 100 UNIT/ML injection Inject 0.25 mLs (25 Units total) into the skin 2 (two) times daily with a meal. Patient taking differently: Inject 2 Units into the skin 2 (two) times daily as needed (blood sugar of 140 or higher).  03/12/18  Yes Fritzi Mandes, MD  isosorbide mononitrate (IMDUR) 30 MG 24 hr tablet Take 1 tablet (30 mg total) by mouth daily. 06/23/19 09/21/19 Yes Agbor-Etang, Aaron Edelman, MD  nystatin (MYCOSTATIN) 100000 UNIT/ML suspension Take 5 mLs (500,000 Units total) by mouth 3 (three) times daily for 14 days. 07/24/19 08/07/19 Yes Ivor Costa, MD  omeprazole (PRILOSEC) 20 MG capsule Take 1 capsule (20 mg total) by mouth daily. 01/10/15  Yes Gladstone Lighter, MD  tamsulosin (FLOMAX) 0.4 MG CAPS capsule Take 1 capsule (0.4 mg total) by mouth daily. 06/19/19  Yes Hollice Espy, MD  vitamin C (VITAMIN C) 500 MG tablet Take 1 tablet (500 mg total) by mouth 2 (two) times daily. 07/21/19  Yes Ivor Costa, MD  albuterol (PROVENTIL HFA;VENTOLIN HFA) 108 (90 BASE) MCG/ACT inhaler Inhale 2 puffs into the lungs every 6 (six) hours as needed for wheezing or shortness of breath. 01/10/15   Gladstone Lighter, MD  clopidogrel (PLAVIX) 75 MG tablet Take 1 tablet (75 mg total) by mouth daily with breakfast. Patient not taking: Reported on 07/31/2019 05/27/19   Dustin Flock, MD  gentamicin cream (GARAMYCIN) 0.1 % Apply 1 application topically daily. 03/02/18   [provider]  mupirocin ointment (BACTROBAN) 2 % Place 1 application into the nose 2 (two) times daily. 07/21/19   Ivor Costa, MD  nitroGLYCERIN (NITROSTAT) 0.4 MG SL tablet Place 0.4 mg under the tongue every 5 (five) minutes as needed for chest pain. Pt needs new Rx. Bottle has expried    [provider]  Nutritional  Supplements (FEEDING SUPPLEMENT, NEPRO CARB STEADY,) LIQD Take 237 mLs by mouth 2 (two) times daily between meals for 15 days. Patient taking differently: Take 237 mLs by mouth 3 (three) times a week.  07/22/19 08/06/19  Ivor Costa, MD  sulfamethoxazole-trimethoprim (BACTRIM) 400-80 MG tablet Take 1 tablet by mouth daily. Patient not taking: Reported on 07/31/2019 07/26/19  Tsosie Billing, MD  traMADol (ULTRAM) 50 MG tablet Take 50 mg by mouth every 6 (six) hours as needed for moderate pain.  07/06/19   [provider]   No results found.  Positive ROS: All other systems have been reviewed and were otherwise negative with the exception of those mentioned in the HPI and as above.  12 point ROS was performed.  Physical Exam: General: Alert and oriented.  No apparent distress.  Vascular:  Left foot:Dorsalis Pedis:  absent Posterior Tibial:  absent  Right foot: Dorsalis Pedis:  absent Posterior Tibial:  absent  Neuro: Gross sensation seems to be intact but protective sensations absent.  He is painful to his right leg with palpation.  Derm: The left foot debridement site of the 1st ray amputation 2nd toe amputation region is stable.  On the very distal aspect there is a small area of necrotic tissue that seems to be superficial.  No active drainage.  Remainder of the incision is dry.   Right foot with gangrene on the very distal aspect of the great toe.  Area of necrosis dorsally to the great toe.  Mottled skin diffusely to the right foot is noted at this time.        Ortho/MS: Status post left 1st ray amputation and 2nd toe amputation.  Assessment: Severe peripheral vascular disease with gangrene.  Gangrene of the right hallux.  Status post debridement left 1st ray and 2nd toe  Plan: The left foot is stable at this point.  There is no active drainage or infection at this time.  Small area of necrosis on the distal aspect is noted but will monitor for now.  There is  gangrene to the right great toe with prenecrotic area on the dorsal aspect of the great toe.  Mottled skin diffusely to the right foot.  Patient is status post 4th ray amputation.  At this point I would not recommend surgical intervention or amputation of the great toe yet.  I would wait for further demarcation of the great toe.  We can reevaluate on Monday and possibly make a decision at that time.  At minimum he will need at least great toe amputation but further debridement may be warranted based on appearance of the skin.    Elesa Hacker, DPM Cell [phone]   08/05/2019 9:38 AM

## 2019-08-05 NOTE — Progress Notes (Signed)
MEDICATION RELATED CONSULT NOTE - FOLLOW UP   Pharmacy Consult for Aranesp Indication: Anemia due to Chronic Kidney Disease  No Known Allergies  Patient Measurements: Height: 5\' 9"  (175.3 cm) Weight: 202 lb 9.6 oz (91.9 kg) IBW/kg (Calculated) : 70.7 Adjusted Body Weight:    Vital Signs: Temp: 98 F (36.7 C) (12/05 0734) Temp Source: Oral (12/05 0734) BP: 107/44 (12/05 0734) Pulse Rate: 73 (12/05 0734) Intake/Output from previous day: 12/04 0701 - 12/05 0700 In: 1074.4 [I.V.:874.4; IV Piggyback:200] Out: 643  Intake/Output from this shift: Total I/O In: -  Out: 644 [Other:644]  Labs: Recent Labs    08/03/19 0559 08/04/19 0512 08/05/19 0648  WBC 9.8 10.5 12.8*  HGB 7.7* 8.3* 7.7*  HCT 24.0* 25.9* 24.0*  PLT 255 258 232  CREATININE 7.32* 7.47* 7.80*  MG 2.5*  --   --    Estimated Creatinine Clearance: 11.3 mL/min (A) (by C-G formula based on SCr of 7.8 mg/dL (H)).   Microbiology: Recent Results (from the past 720 hour(s))  SARS CORONAVIRUS 2 (TAT 6-24 HRS) Nasopharyngeal Nasopharyngeal Swab     Status: None   Collection Time: 07/14/19  1:11 PM   Specimen: Nasopharyngeal Swab  Result Value Ref Range Status   SARS Coronavirus 2 NEGATIVE NEGATIVE Final    Comment: (NOTE) SARS-CoV-2 target nucleic acids are NOT DETECTED. The SARS-CoV-2 RNA is generally detectable in upper and lower respiratory specimens during the acute phase of infection. Negative results do not preclude SARS-CoV-2 infection, do not rule out co-infections with other pathogens, and should not be used as the sole basis for treatment or other patient management decisions. Negative results must be combined with clinical observations, patient history, and epidemiological information. The expected result is Negative. Fact Sheet for Patients: SugarRoll.be Fact Sheet for Healthcare Providers: https://www.woods-mathews.com/ This test is not yet approved or  cleared by the [place] FDA and  has been authorized for detection and/or diagnosis of SARS-CoV-2 by FDA under an Emergency Use Authorization (EUA). This EUA will remain  in effect (meaning this test can be used) for the duration of the COVID-19 declaration under Section 56 4(b)(1) of the Act, 21 U.S.C. section 360bbb-3(b)(1), unless the authorization is terminated or revoked sooner. Performed at [facility], [place] [address], [place], [place] [address]   Surgical PCR screen     Status: None   Collection Time: 07/18/19  6:35 AM   Specimen: Nasal Mucosa; Nasal Swab  Result Value Ref Range Status   MRSA, PCR NEGATIVE NEGATIVE Final   Staphylococcus aureus NEGATIVE NEGATIVE Final    Comment: (NOTE) The Xpert SA Assay (FDA approved for NASAL specimens in patients 63 years of age and older), is one component of a comprehensive surveillance program. It is not intended to diagnose infection nor to guide or monitor treatment. Performed at [facility], [address], [place], [place] [address]   Aerobic/Anaerobic Culture (surgical/deep wound)     Status: None   Collection Time: 07/19/19  5:11 PM   Specimen: Wound; Tissue  Result Value Ref Range Status   Specimen Description TISSUE  Final   Special Requests LEFT 1ST METATARSAL  Final   Gram Stain   Final    MODERATE WBC PRESENT, PREDOMINANTLY MONONUCLEAR NO ORGANISMS SEEN    Culture   Final    RARE ACHROMOBACTER DENITRIFICANS NO ANAEROBES ISOLATED CRITICAL VALUE NOTED.  VALUE IS CONSISTENT WITH PREVIOUSLY REPORTED AND CALLED VALUE. REGARDING CULTUTRE GROWTH Performed at [facility], [place]  [address] [place], [place] [address]    Report Status 07/24/2019 FINAL  Final   Organism ID, Bacteria ACHROMOBACTER DENITRIFICANS  Final      Susceptibility   Achromobacter denitrificans - MIC*    CEFEPIME >=64 RESISTANT Resistant     CEFAZOLIN >=64 RESISTANT Resistant     GENTAMICIN >=16 RESISTANT Resistant      CIPROFLOXACIN >=4 RESISTANT Resistant     IMIPENEM 8 INTERMEDIATE Intermediate     TRIMETH/SULFA <=20 SENSITIVE Sensitive     * RARE ACHROMOBACTER DENITRIFICANS  Aerobic/Anaerobic Culture (surgical/deep wound)     Status: None   Collection Time: 07/19/19  5:15 PM   Specimen: Wound; Tissue  Result Value Ref Range Status   Specimen Description TISSUE  Final   Special Requests SAMPLE B ,2ND METATARSAL BONE  Final   Gram Stain   Final    RARE WBC PRESENT, PREDOMINANTLY MONONUCLEAR NO ORGANISMS SEEN    Culture   Final    RARE ACHROMOBACTER SPECIES RARE STAPHYLOCOCCUS EPIDERMIDIS NO ANAEROBES ISOLATED CRITICAL RESULT CALLED TO, READ BACK BY AND VERIFIED WITH: Roxana Hires RN, AT 1139 07/24/19 BY D. VANHOOK REGARDING CULTURE GROWTH Performed at [facility], [place] [address] [place], [place] [address]    Report Status 07/24/2019 FINAL  Final   Organism ID, Bacteria ACHROMOBACTER SPECIES  Final   Organism ID, Bacteria STAPHYLOCOCCUS EPIDERMIDIS  Final      Susceptibility   Staphylococcus epidermidis - MIC*    CIPROFLOXACIN >=8 RESISTANT Resistant     ERYTHROMYCIN <=0.25 SENSITIVE Sensitive     GENTAMICIN <=0.5 SENSITIVE Sensitive     OXACILLIN >=4 RESISTANT Resistant     TETRACYCLINE 2 SENSITIVE Sensitive     VANCOMYCIN 2 SENSITIVE Sensitive     TRIMETH/SULFA 80 RESISTANT Resistant     CLINDAMYCIN >=8 RESISTANT Resistant     RIFAMPIN <=0.5 SENSITIVE Sensitive     Inducible Clindamycin NEGATIVE Sensitive     * RARE STAPHYLOCOCCUS EPIDERMIDIS   Achromobacter species - MIC*    CEFEPIME >=64 RESISTANT Resistant     CEFAZOLIN >=64 RESISTANT Resistant     GENTAMICIN >=16 RESISTANT Resistant     CIPROFLOXACIN >=4 RESISTANT Resistant     IMIPENEM 8 INTERMEDIATE Intermediate     TRIMETH/SULFA <=20 SENSITIVE Sensitive     * RARE ACHROMOBACTER SPECIES  SARS CORONAVIRUS 2 (TAT 6-24 HRS) Nasopharyngeal Nasopharyngeal Swab     Status: None   Collection Time: 07/28/19 11:24 AM   Specimen:  Nasopharyngeal Swab  Result Value Ref Range Status   SARS Coronavirus 2 NEGATIVE NEGATIVE Final    Comment: (NOTE) SARS-CoV-2 target nucleic acids are NOT DETECTED. The SARS-CoV-2 RNA is generally detectable in upper and lower respiratory specimens during the acute phase of infection. Negative results do not preclude SARS-CoV-2 infection, do not rule out co-infections with other pathogens, and should not be used as the sole basis for treatment or other patient management decisions. Negative results must be combined with clinical observations, patient history, and epidemiological information. The expected result is Negative. Fact Sheet for Patients: SugarRoll.be Fact Sheet for Healthcare Providers: https://www.woods-mathews.com/ This test is not yet approved or cleared by the [place] FDA and  has been authorized for detection and/or diagnosis of SARS-CoV-2 by FDA under an Emergency Use Authorization (EUA). This EUA will remain  in effect (meaning this test can be used) for the duration of the COVID-19 declaration under Section 56 4(b)(1) of the Act, 21 U.S.C. section 360bbb-3(b)(1), unless the authorization is terminated or  revoked sooner. Performed at [facility], [place] [address], [place], [place] [address]    Assessment: Patient is a 58yo male with a h/o ESRD on peritoneal dialysis. Pharmacy consulted to dose Aranesp for anemia of chronic kidney disease.  Hgb 7.7  Plan:  Initial dosing for patients on dialysis is 0.45 mcg/kg weekly. Will order Aranesp 47mcg SQ weekly. Will monitor labs.  Paulina Fusi, PharmD, BCPS 08/05/2019 11:32 AM

## 2019-08-05 NOTE — Progress Notes (Signed)
Dressing to left femoral groin changed

## 2019-08-05 NOTE — Progress Notes (Signed)
1 Day Post-Op   Subjective/Chief Complaint: Patient is doing well, but complains of left leg swelling.  I explained to him that this is normal after revascularization.  He denies any other issues.    Objective: Vital signs in last 24 hours: Temp:  [97.5 F (36.4 C)-99 F (37.2 C)] 98 F (36.7 C) (12/05 0734) Pulse Rate:  [66-78] 73 (12/05 0734) Resp:  [10-19] 17 (12/05 0349) BP: (86-126)/(34-94) 107/44 (12/05 0734) SpO2:  [90 %-100 %] 100 % (12/05 0734) Last BM Date: 08/02/19  Intake/Output from previous day: 12/04 0701 - 12/05 0700 In: 1074.4 [I.V.:874.4; IV Piggyback:200] Out: 643  Intake/Output this shift: Total I/O In: -  Out: 644 [Other:644]  General appearance: alert, cooperative, appears stated age and no distress Head: Normocephalic, without obvious abnormality, atraumatic Eyes: conjunctivae/corneas clear. PERRL, EOM's intact. Fundi benign. Neck: no adenopathy, no carotid bruit, no JVD, supple, symmetrical, trachea midline and thyroid not enlarged, symmetric, no tenderness/mass/nodules Back: symmetric, no curvature. ROM normal. No CVA tenderness. Resp: clear to auscultation bilaterally Chest wall: no tenderness Extremities: extremities normal, atraumatic, no cyanosis or edema Incision/Wound: The left foot has good doppler signal, the dressings are intact.   Lab Results:  Recent Labs    08/04/19 0512 08/05/19 0648  WBC 10.5 12.8*  HGB 8.3* 7.7*  HCT 25.9* 24.0*  PLT 258 232   BMET Recent Labs    08/04/19 0512 08/05/19 0648  NA 138 138  K 4.2 4.5  CL 100 102  CO2 27 24  GLUCOSE 117* 107*  BUN 44* 46*  CREATININE 7.47* 7.80*  CALCIUM 8.4* 8.0*   PT/INR No results for input(s): LABPROT, INR in the last 72 hours. ABG No results for input(s): PHART, HCO3 in the last 72 hours.  Invalid input(s): PCO2, PO2  Studies/Results: No results found.  Anti-infectives: Anti-infectives (From admission, onward)   Start     Dose/Rate Route Frequency Ordered  Stop   08/05/19 1000  vancomycin (VANCOCIN) IVPB 1000 mg/200 mL premix     1,000 mg 200 mL/hr over 60 Minutes Intravenous  Once 08/04/19 1144     08/04/19 0950  clindamycin (CLEOCIN) 300 MG/50ML IVPB    Note to Pharmacy: Maynor, Erin   : cabinet override      08/04/19 0950 08/04/19 2159   08/04/19 0730  clindamycin (CLEOCIN) IVPB 300 mg    Note to Pharmacy: To be given in specials   300 mg 100 mL/hr over 30 Minutes Intravenous  Once 08/04/19 0715 08/04/19 1057   08/01/19 1245  ceFAZolin (ANCEF) IVPB 1 g/50 mL premix    Note to Pharmacy: To be given in specials   1 g 100 mL/hr over 30 Minutes Intravenous  Once 08/01/19 1234 08/01/19 1426   07/31/19 1600  cefTAZidime (FORTAZ) 1 g in sodium chloride 0.9 % 100 mL IVPB     1 g 200 mL/hr over 30 Minutes Intravenous Every 24 hours 07/31/19 1520     07/31/19 1530  vancomycin variable dose per unstable renal function (pharmacist dosing)      Does not apply See admin instructions 07/31/19 1530     07/31/19 1215  sulfamethoxazole-trimethoprim (BACTRIM) 400-80 MG per tablet 1 tablet     1 tablet Oral Daily 07/31/19 1021     07/31/19 1030  cefTAZidime (FORTAZ) IVPB  Status:  Discontinued    Note to Pharmacy: Indication:  cellulitis Last Day of Therapy:  08/07/19 Please Administer this Antibiotic Intraperitoneally     1 g Intravenous Every 24 hours  07/31/19 1021 07/31/19 1514   07/31/19 1030  vancomycin IVPB  Status:  Discontinued    Note to Pharmacy: Indication:  cellulits Last Day of Therapy:  08/04/19 Please Administer Antibiotic Intraperitoneally     2,000 mg Intravenous 2 times weekly 07/31/19 1021 07/31/19 1529      Assessment/Plan: s/p Procedure(s): Lower Extremity Angiography (Pedal Approach) (Right)  LOS: 5 days  Continue with conservative therapy.  Elmore Guise 08/05/2019

## 2019-08-05 NOTE — Progress Notes (Signed)
Dressing changed to left femoral groin, with pressure applied.

## 2019-08-05 NOTE — Progress Notes (Signed)
[facility] Progress Note  Patient: Scott [name]:[id number]   PCP: Inc, [facility] DOB: Dec 30, 1960   DOA: 07/31/2019   DOS: 08/05/2019   Date of Service: the patient was seen and examined on 08/05/2019  Chief Complaint  Patient presents with  . Leg Pain   Brief hospital course: Scott [name] is an 58 y.o. male with medical history significant ofperipheral arterial disease, diabetes mellitus type 2, ESRD on peritoneal dialysis presents to the hospital with complaints of right lower extremity pain. Patient was admitted here last week for left lower extremity pain diagnosed with osteomyelitis requiring angiogram by vascular followed by amputation of left second toe by podiatry. He was eventually discharged on IV antibiotics. He had been feeling better but started experiencing right lower extremity pain over the last couple of days. Initially pain was with exertion but now has progressed to persistent and his toes have darkened. Underwent angioplasty of right profunda femoris artery on 08/01/2019. Angioplasty of right superficial femoral and popliteal arteries as well as right peroneal artery on 08/04/2019. Nephrology was consulted to continue peritoneal dialysis here in the hospital. Podiatry has evaluated the patient and currently recommending conservative measures but reevaluation on Monday. ID recommends continuing vancomycin plus cefepime plus Bactrim until 08/18/2019.  Currently further plan is continue IV antibiotics and monitor patient's progress.  Subjective: Denies any acute complaint continues to have pain in his left foot.  No nausea no vomiting.  No chest pain abdominal pain.  No diarrhea no constipation.  Assessment and Plan: Scheduled Meds: . aspirin EC  81 mg Oral Daily  . atorvastatin  40 mg Oral Daily  . calcitRIOL  0.25 mcg Oral Daily  . calcium acetate  2,001 mg Oral TID WC  . clopidogrel  75 mg Oral Q breakfast  . darbepoetin (ARANESP)  injection - NON-DIALYSIS  40 mcg Subcutaneous Q Sat-1800  . gabapentin  300 mg Oral QHS  . gentamicin cream  1 application Topical Daily  . insulin aspart  0-5 Units Subcutaneous QHS  . insulin aspart  0-9 Units Subcutaneous TID WC  . insulin aspart  2 Units Subcutaneous TID WC  . pantoprazole  40 mg Oral Daily  . polyethylene glycol  17 g Oral BID  . senna-docusate  2 tablet Oral BID  . sulfamethoxazole-trimethoprim  1 tablet Oral Daily  . tamsulosin  0.4 mg Oral Daily  . vancomycin variable dose per unstable renal function (pharmacist dosing)   Does not apply See admin instructions   Continuous Infusions: . cefTAZidime (FORTAZ)  IV Stopped (08/04/19 1623)  . dialysis solution 1.5% low-MG/low-CA    . vancomycin     PRN Meds: acetaminophen **OR** acetaminophen, albuterol, bisacodyl, calcium acetate, methocarbamol, morphine injection, ondansetron **OR** ondansetron (ZOFRAN) IV, oxyCODONE  Right lower leg gangrene. Severe peripheral vascular disease Recent left toe osteomyelitis. Vascular surgery was consulted. Patient has undergone percutaneous angioplasty of multiple blood vessels in the right leg in a staged fashion on 12/1 and 08/04/2019. Was treated with IV heparin infusion as well. We will continue with antiplatelet therapy for now. Appreciate vascular surgery assistance in managing this patient. Monitor response to the treatment. Currently right foot appears to be warm to touch  Essential hypertension. Chronic systolic CHF Persistent hypotension Patient is on multiple antihypertensive medication. Blood pressure currently remaining soft. Currently not on any antihypertensive medication. We will continue with Flomax for now.  Anemia of chronic disease:  Monitor H&H and transfuse as needed.  ESRD on peritoneal dialysis:  Nephrology consulted. Appreciate their assistance in managing this patient. Continuing PD here at the hospital. If the patient has to go to SNF he  will require to be transitioned to HD. Fistula on right extremity appears to be working.  Type 2 diabetes mellitus uncontrolled with hyperglycemia associated with vascular complication Continue with sliding scale insulin. Hemoglobin A1c 6.0 on 07/17/2019.  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   Continue aspirin and Lipitor  Recent osteomyelitis of left toe: Continue antibiotics (ceftazidime, vancomycin and Bactrim) Last day 08/18/2019.  Diet: Regular diet approved by nephrology DVT Prophylaxis: Subcutaneous Heparin    Advance goals of care discussion: Full code  Family Communication: no family was present at bedside, at the time of interview.  Disposition:  Discharge to be determined.  Consultants: Vascular surgery, podiatry, nephrology, infectious disease Procedures: Angiography and angioplasty of the right leg vessels  Antibiotics: Anti-infectives (From admission, onward)   Start     Dose/Rate Route Frequency Ordered Stop   08/05/19 1000  vancomycin (VANCOCIN) IVPB 1000 mg/200 mL premix     1,000 mg 200 mL/hr over 60 Minutes Intravenous  Once 08/04/19 1144     08/04/19 0950  clindamycin (CLEOCIN) 300 MG/50ML IVPB    Note to Pharmacy: Maynor, Erin   : cabinet override      08/04/19 0950 08/04/19 2159   08/04/19 0730  clindamycin (CLEOCIN) IVPB 300 mg    Note to Pharmacy: To be given in specials   300 mg 100 mL/hr over 30 Minutes Intravenous  Once 08/04/19 0715 08/04/19 1057   08/01/19 1245  ceFAZolin (ANCEF) IVPB 1 g/50 mL premix    Note to Pharmacy: To be given in specials   1 g 100 mL/hr over 30 Minutes Intravenous  Once 08/01/19 1234 08/01/19 1426   07/31/19 1600  cefTAZidime (FORTAZ) 1 g in sodium chloride 0.9 % 100 mL IVPB     1 g 200 mL/hr over 30 Minutes Intravenous Every 24 hours 07/31/19 1520     07/31/19 1530  vancomycin variable dose per unstable renal function (pharmacist  dosing)      Does not apply See admin instructions 07/31/19 1530     07/31/19 1215  sulfamethoxazole-trimethoprim (BACTRIM) 400-80 MG per tablet 1 tablet     1 tablet Oral Daily 07/31/19 1021     07/31/19 1030  cefTAZidime (FORTAZ) IVPB  Status:  Discontinued    Note to Pharmacy: Indication:  cellulitis Last Day of Therapy:  08/07/19 Please Administer this Antibiotic Intraperitoneally     1 g Intravenous Every 24 hours 07/31/19 1021 07/31/19 1514   07/31/19 1030  vancomycin IVPB  Status:  Discontinued    Note to Pharmacy: Indication:  cellulits Last Day of Therapy:  08/04/19 Please Administer Antibiotic Intraperitoneally     2,000 mg Intravenous 2 times weekly 07/31/19 1021 07/31/19 1529       Objective: Physical Exam: Vitals:   08/04/19 1557 08/04/19 1927 08/05/19 0349 08/05/19 0734  BP: (!) 107/94 101/73 109/62 (!) 107/44  Pulse: 76 76 78 73  Resp:  17 17   Temp:  98.5 F (36.9 C) 99 F (37.2 C) 98 F (36.7 C)  TempSrc:  Oral Oral Oral  SpO2:  90% 94% 100%  Weight:      Height:        Intake/Output Summary (Last 24 hours) at 08/05/2019 1257 Last data filed at 08/05/2019 0847 Gross per 24 hour  Intake 1074.39 ml  Output 644 ml  Net 430.39 ml   Filed Weights   08/01/19 0549 08/02/19 0424 08/04/19 0934  Weight: 94.5 kg 91.9 kg 91.9 kg   General: alert and oriented to time, place, and person. Appear in mild distress, affect appropriate Eyes: PERRL, Conjunctiva normal ENT: Oral Mucosa Clear, moist  Neck: no JVD, no Abnormal Mass Or lumps Cardiovascular: S1 and S2 Present, no Murmur,  Respiratory: good respiratory effort, Bilateral Air entry equal and Decreased, no signs of accessory muscle use, Clear to Auscultation, n Crackles, no wheezes Abdomen: Bowel Sound present, Soft and distended, no tenderness, no hernia Skin: no rashes  Extremities: Bilateral pedal edema, right leg warm to touch, no calf tenderness Neurologic: without any new focal findings Gait not checked  due to patient safety concerns  Data Reviewed: I have personally reviewed and interpreted daily labs, tele strips, imagings as discussed above. I reviewed all nursing notes, pharmacy notes, vitals, pertinent old records I have discussed plan of care as described above with RN and patient/family.  CBC: Recent Labs  Lab 08/01/19 0003 08/01/19 0842 08/02/19 0620 08/03/19 0559 08/04/19 0512 08/05/19 0648  WBC 9.6  --  9.7 9.8 10.5 12.8*  NEUTROABS  --   --  7.4  --   --   --   HGB 7.1* 7.4* 7.7* 7.7* 8.3* 7.7*  HCT 23.1*  --  24.6* 24.0* 25.9* 24.0*  MCV 97.9  --  98.0 94.9 94.5 94.5  PLT 221  --  244 255 258 482   Basic Metabolic Panel: Recent Labs  Lab 08/01/19 0519 08/02/19 0620 08/03/19 0559 08/04/19 0512 08/05/19 0648  NA 138 138 139 138 138  K 4.4 4.2 4.4 4.2 4.5  CL 103 102 102 100 102  CO2 25 25 25 27 24   GLUCOSE 128* 126* 121* 117* 107*  BUN 59* 55* 49* 44* 46*  CREATININE 7.21* 7.42* 7.32* 7.47* 7.80*  CALCIUM 7.8* 8.0* 8.3* 8.4* 8.0*  MG  --   --  2.5*  --   --     Liver Function Tests: Recent Labs  Lab 07/31/19 0817 08/01/19 0519  AST 23 14*  ALT 19 14  ALKPHOS 110 91  BILITOT 0.8 0.7  PROT 6.4* 5.5*  ALBUMIN 2.6* 2.2*   No results for input(s): LIPASE, AMYLASE in the last 168 hours. No results for input(s): AMMONIA in the last 168 hours. Coagulation Profile: Recent Labs  Lab 07/31/19 0817 08/01/19 0519  INR 1.1 1.3*   Cardiac Enzymes: No results for input(s): CKTOTAL, CKMB, CKMBINDEX, TROPONINI in the last 168 hours. BNP (last 3 results) No results for input(s): PROBNP in the last 8760 hours. CBG: Recent Labs  Lab 08/04/19 1658 08/04/19 2032 08/05/19 0600 08/05/19 0754 08/05/19 1216  GLUCAP 159* 82 111* 109* 181*   Studies: No results found.   Time spent: 35 minutes  Author: Berle Mull, MD [facility] 08/05/2019 12:57 PM  To reach On-call, see care teams to locate the attending and reach out to them via  [email or web address]. If 7PM-7AM, please contact night-coverage If you still have difficulty reaching the attending provider, please page the [facility] (Director on Call) for [facility] on amion for assistance.

## 2019-08-05 NOTE — Progress Notes (Signed)
Pt c/o abdominal pain and states he has not urinated all day. Pt had peritoneal dialysis last night and disconnected this am. Bladder scan showed 177. Physican notified and orders for abd xray and labs. Left groin site dressing changed three times this shift due to being saturated. Dr Feliberto Gottron notified and aware and assessed at bedside. Pressure dressing applied on the after the second dressing change with better effect.

## 2019-08-05 NOTE — Plan of Care (Signed)
  Problem: Education: Goal: Knowledge of General Education information will improve Description: Including pain rating scale, medication(s)/side effects and non-pharmacologic comfort measures Outcome: Progressing   Problem: Clinical Measurements: Goal: Will remain free from infection Outcome: Progressing   Problem: Safety: Goal: Ability to remain free from injury will improve Outcome: Progressing   

## 2019-08-06 LAB — CBC
HCT: 25.3 % — ABNORMAL LOW (ref 39.0–52.0)
Hemoglobin: 7.8 g/dL — ABNORMAL LOW (ref 13.0–17.0)
MCH: 29.7 pg (ref 26.0–34.0)
MCHC: 30.8 g/dL (ref 30.0–36.0)
MCV: 96.2 fL (ref 80.0–100.0)
Platelets: 227 10*3/uL (ref 150–400)
RBC: 2.63 MIL/uL — ABNORMAL LOW (ref 4.22–5.81)
RDW: 16.3 % — ABNORMAL HIGH (ref 11.5–15.5)
WBC: 12.1 10*3/uL — ABNORMAL HIGH (ref 4.0–10.5)
nRBC: 0 % (ref 0.0–0.2)

## 2019-08-06 LAB — GLUCOSE, CAPILLARY
Glucose-Capillary: 92 mg/dL (ref 70–99)
Glucose-Capillary: 134 mg/dL — ABNORMAL HIGH (ref 70–99)
Glucose-Capillary: 109 mg/dL — ABNORMAL HIGH (ref 70–99)
Glucose-Capillary: 103 mg/dL — ABNORMAL HIGH (ref 70–99)

## 2019-08-06 LAB — BASIC METABOLIC PANEL
Anion gap: 12 (ref 5–15)
BUN: 49 mg/dL — ABNORMAL HIGH (ref 6–20)
CO2: 27 mmol/L (ref 22–32)
Calcium: 8.5 mg/dL — ABNORMAL LOW (ref 8.9–10.3)
Chloride: 101 mmol/L (ref 98–111)
Creatinine, Ser: 7.96 mg/dL — ABNORMAL HIGH (ref 0.61–1.24)
GFR calc Af Amer: 8 mL/min — ABNORMAL LOW (ref >60)
GFR calc non Af Amer: 7 mL/min — ABNORMAL LOW (ref >60)
Glucose, Bld: 96 mg/dL (ref 70–99)
Potassium: 4.6 mmol/L (ref 3.5–5.1)
Sodium: 140 mmol/L (ref 135–145)

## 2019-08-06 LAB — VANCOMYCIN, RANDOM: Vancomycin Rm: 28

## 2019-08-06 MED ORDER — GABAPENTIN 300 MG PO CAPS
300.0000 mg | ORAL_CAPSULE | Freq: Two times a day (BID) | ORAL | Status: DC
  Administered 2019-08-06 – 2019-08-07 (×3): 300 mg via ORAL
  Filled 2019-08-06 (×3): qty 1

## 2019-08-06 MED ORDER — LACTULOSE 10 GM/15ML PO SOLN
20.0000 g | Freq: Every day | ORAL | Status: DC
  Administered 2019-08-06 – 2019-08-12 (×5): 20 g via ORAL
  Filled 2019-08-06 (×6): qty 30

## 2019-08-06 NOTE — Progress Notes (Signed)
Post CCPD TX, pt tolerated tx well, uf 652mL. No complaints    08/06/19 0819  Neurological  Level of Consciousness Alert  Orientation Level Oriented X4  Respiratory  Respiratory Pattern Regular;Unlabored  Chest Assessment Chest expansion symmetrical  Bilateral Breath Sounds Clear;Diminished  Cough None  Cardiac  Pulse Regular  Heart Sounds S1, S2  ECG Monitor Yes  Cardiac Rhythm NSR  Psychosocial  Psychosocial (WDL) WDL

## 2019-08-06 NOTE — Progress Notes (Signed)
[facility] Progress Note  Patient: Scott [name] QMV:[id number]   PCP: Inc, [facility] DOB: 1961-03-23   DOA: 07/31/2019   DOS: 08/06/2019   Date of Service: the patient was seen and examined on 08/06/2019  Chief Complaint  Patient presents with  . Leg Pain     Brief hospital course: Scott Batz Herseyis an 58 y.o.malewith medical history significant ofperipheral arterial disease, diabetes mellitus type 2, ESRD on peritoneal dialysis presents to the hospital with complaints of right lower extremity pain. Patient was admitted here last week for left lower extremity pain diagnosed with osteomyelitis requiring angiogram by vascular followed by amputation of left second toe by podiatry. He was eventually discharged on IV antibiotics. He had been feeling better but started experiencing right lower extremity pain over the last couple of days. Initially pain was with exertion but now has progressed to persistent and his toes have darkened. Underwent angioplasty of right profunda femoris artery on 08/01/2019. Angioplasty of right superficial femoral and popliteal arteries as well as right peroneal artery on 08/04/2019. Nephrology was consulted to continue peritoneal dialysis here in the hospital. Podiatry has evaluated the patient and currently recommending conservative measures but reevaluation on Monday. ID recommends continuing vancomycin plus cefepime plus Bactrim until 08/18/2019.  Currently further plan is continue IV antibiotics and monitor patient's progress. treat constipation and PT re-eval.   Subjective: reports nausea and abdominal pain, no fever or chills, no chest pain or shortness of breath. Tell me that his right leg is sore and he is not able to bear any weight on it. Last dose of pain meds was on 11 PM last night  Assessment and Plan: Scheduled Meds: . aspirin EC  81 mg Oral Daily  . atorvastatin  40 mg Oral Daily  . calcitRIOL  0.25 mcg Oral Daily  .  calcium acetate  2,001 mg Oral TID WC  . clopidogrel  75 mg Oral Q breakfast  . darbepoetin (ARANESP) injection - NON-DIALYSIS  40 mcg Subcutaneous Q Sat-1800  . gabapentin  300 mg Oral BID  . gentamicin cream  1 application Topical Daily  . insulin aspart  0-5 Units Subcutaneous QHS  . insulin aspart  0-9 Units Subcutaneous TID WC  . insulin aspart  2 Units Subcutaneous TID WC  . lactulose  20 g Oral Daily  . pantoprazole  40 mg Oral Daily  . polyethylene glycol  17 g Oral BID  . senna-docusate  2 tablet Oral BID  . sulfamethoxazole-trimethoprim  1 tablet Oral Daily  . vancomycin variable dose per unstable renal function (pharmacist dosing)   Does not apply See admin instructions   Continuous Infusions: . cefTAZidime (FORTAZ)  IV Stopped (08/04/19 1623)  . dialysis solution 1.5% low-MG/low-CA     PRN Meds: acetaminophen **OR** acetaminophen, albuterol, bisacodyl, calcium acetate, methocarbamol, morphine injection, ondansetron **OR** ondansetron (ZOFRAN) IV, oxyCODONE  Right lower leg gangrene. Severe peripheral vascular disease Recent left toe osteomyelitis. Vascular surgery was consulted. Patient has undergone percutaneous angioplasty of multiple blood vessels in the right leg in a staged fashion on 12/1 and 08/04/2019. Was treated with IV heparin infusion as well. We will continue with antiplatelet therapy for now. Appreciate vascular surgery assistance in managing this patient. Monitor response to the treatment. Currently right foot appears to be warm to touch. While the pt reports pain in the leg he has not sued the pain meds as prescribed.  Informed the pt to use the pain meds more frequently to allow for increased  mobility.  Also increase gabapentin   Essential hypertension. Chronic systolic CHF Persistent hypotension Patient is on multiple antihypertensive medication. Blood pressure currently remaining soft. Currently not on any antihypertensive medication. We will  continue with Flomax for now.  Anemia of chronic disease:  Monitor H&H and transfuse as needed.  ESRD on peritoneal dialysis:  Nephrology consulted. Appreciate their assistance in managing this patient. Continuing PD here at the hospital. If the patient has to go to SNF he will require to be transitioned to HD. Fistula on right extremity appears to be working.  Type 2 diabetes mellitus uncontrolled with hyperglycemia associated with vascular complication Continue with sliding scale insulin. Hemoglobin A1c 6.0 on 07/17/2019.  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  Continue aspirin and Lipitor  Recent osteomyelitis of left toe: Continue antibiotics (ceftazidime, vancomycin and Bactrim) Last day 08/18/2019.  Abdominal pain Severe constipation  Continue with the bowel regimen and add lactulose  Obesity  Body mass index is 31.09 kg/m.  Dietary consult  Diet: Regular diet  DVT Prophylaxis: Subcutaneous Heparin    Advance goals of care discussion: Full code  Family Communication: no family was present at bedside, at the time of interview.  Disposition:  Discharge to be determined.  Consultants: Vascular surgery, podiatry, nephrology, infectious disease Procedures: Angiography and angioplasty of the right leg vessels  Antibiotics: Anti-infectives (From admission, onward)   Start     Dose/Rate Route Frequency Ordered Stop   08/05/19 1000  vancomycin (VANCOCIN) IVPB 1000 mg/200 mL premix     1,000 mg 200 mL/hr over 60 Minutes Intravenous  Once 08/04/19 1144 08/05/19 1506   08/04/19 0950  clindamycin (CLEOCIN) 300 MG/50ML IVPB    Note to Pharmacy: Maynor, Erin   : cabinet override      08/04/19 0950 08/04/19 2159   08/04/19 0730  clindamycin (CLEOCIN) IVPB 300 mg    Note to Pharmacy: To be given in specials   300 mg 100 mL/hr over 30 Minutes Intravenous  Once 08/04/19 0715 08/04/19 1057    08/01/19 1245  ceFAZolin (ANCEF) IVPB 1 g/50 mL premix    Note to Pharmacy: To be given in specials   1 g 100 mL/hr over 30 Minutes Intravenous  Once 08/01/19 1234 08/01/19 1426   07/31/19 1600  cefTAZidime (FORTAZ) 1 g in sodium chloride 0.9 % 100 mL IVPB     1 g 200 mL/hr over 30 Minutes Intravenous Every 24 hours 07/31/19 1520     07/31/19 1530  vancomycin variable dose per unstable renal function (pharmacist dosing)      Does not apply See admin instructions 07/31/19 1530     07/31/19 1215  sulfamethoxazole-trimethoprim (BACTRIM) 400-80 MG per tablet 1 tablet     1 tablet Oral Daily 07/31/19 1021     07/31/19 1030  cefTAZidime (FORTAZ) IVPB  Status:  Discontinued    Note to Pharmacy: Indication:  cellulitis Last Day of Therapy:  08/07/19 Please Administer this Antibiotic Intraperitoneally     1 g Intravenous Every 24 hours 07/31/19 1021 07/31/19 1514   07/31/19 1030  vancomycin IVPB  Status:  Discontinued    Note to Pharmacy: Indication:  cellulits Last Day of Therapy:  08/04/19 Please Administer Antibiotic Intraperitoneally     2,000 mg Intravenous 2 times weekly 07/31/19 1021 07/31/19 1529       Objective: Physical Exam: Vitals:   08/06/19 0433 08/06/19 0532 08/06/19 0810 08/06/19 0940  BP: [name])  94/48  (!) 92/40 (!) 96/33  Pulse: 71  71 75  Resp: 19  16   Temp: 98.4 F (36.9 C)  98.4 F (36.9 C) 98.7 F (37.1 C)  TempSrc: Oral  Oral Oral  SpO2: 99%  100% 97%  Weight:  95.5 kg    Height:        Intake/Output Summary (Last 24 hours) at 08/06/2019 1017 Last data filed at 08/06/2019 0815 Gross per 24 hour  Intake 240 ml  Output 1060 ml  Net -820 ml   Filed Weights   08/02/19 0424 08/04/19 0934 08/06/19 0532  Weight: 91.9 kg 91.9 kg 95.5 kg   General: alert and oriented to time, place, and person. Appear in mild distress, affect appropriate Eyes: PERRL, Conjunctiva normal ENT: Oral Mucosa Clear, moist  Neck: no JVD, no Abnormal Mass Or lumps Cardiovascular: S1  and S2 Present, no Murmur,  Respiratory: good respiratory effort, Bilateral Air entry equal and Decreased, no signs of accessory muscle use, Clear to Auscultation, no Crackles, no wheezes Abdomen: Bowel Sound present, Soft and mild diffuse tenderness, no hernia Skin: no rashes, right leg redness and warmth Extremities: trace Pedal edema, no calf tenderness Neurologic: without any new focal findings gait not checked due to patient safety concerns  Data Reviewed: I have personally reviewed and interpreted daily labs, tele strips, imagings as discussed above. I reviewed all nursing notes, pharmacy notes, vitals, pertinent old records I have discussed plan of care as described above with RN and patient/family.  CBC: Recent Labs  Lab 08/02/19 0620 08/03/19 0559 08/04/19 0512 08/05/19 0648 08/05/19 1914 08/06/19 0535  WBC 9.7 9.8 10.5 12.8* 12.0* 12.1*  NEUTROABS 7.4  --   --   --   --   --   HGB 7.7* 7.7* 8.3* 7.7* 7.4* 7.8*  HCT 24.6* 24.0* 25.9* 24.0* 23.2* 25.3*  MCV 98.0 94.9 94.5 94.5 95.1 96.2  PLT 244 255 258 232 215 381   Basic Metabolic Panel: Recent Labs  Lab 08/03/19 0559 08/04/19 0512 08/05/19 0648 08/05/19 1914 08/06/19 0535  NA 139 138 138 135 140  K 4.4 4.2 4.5 4.8 4.6  CL 102 100 102 101 101  CO2 25 27 24 24 27   GLUCOSE 121* 117* 107* 105* 96  BUN 49* 44* 46* 53* 49*  CREATININE 7.32* 7.47* 7.80* 8.57* 7.96*  CALCIUM 8.3* 8.4* 8.0* 7.8* 8.5*  MG 2.5*  --   --   --   --     Liver Function Tests: Recent Labs  Lab 07/31/19 0817 08/01/19 0519 08/05/19 1914  AST 23 14* 20  ALT 19 14 10   ALKPHOS 110 91 92  BILITOT 0.8 0.7 0.6  PROT 6.4* 5.5* 5.4*  ALBUMIN 2.6* 2.2* 2.1*   Recent Labs  Lab 08/05/19 1914  LIPASE 22   No results for input(s): AMMONIA in the last 168 hours. Coagulation Profile: Recent Labs  Lab 07/31/19 0817 08/01/19 0519  INR 1.1 1.3*   Cardiac Enzymes: No results for input(s): CKTOTAL, CKMB, CKMBINDEX, TROPONINI in the last  168 hours. BNP (last 3 results) No results for input(s): PROBNP in the last 8760 hours. CBG: Recent Labs  Lab 08/05/19 0754 08/05/19 1216 08/05/19 1644 08/05/19 2133 08/06/19 0812  GLUCAP 109* 181* 89 155* 92   Studies: Dg Abd Portable 1v  Result Date: 08/05/2019 CLINICAL DATA:  Abdominal pain, difficulty urinating, history chronic kidney disease, CHF, hypertension, type II diabetes mellitus, GERD EXAM: PORTABLE ABDOMEN - 1 VIEW COMPARISON:  None  FINDINGS: Peritoneal dialysis catheter projects over upper pelvis. Density within urinary bladder consistent with excreted contrast material (post angiography on 08/04/2019). Increased stool throughout proximal half of colon. Small amount gas and stool within rectum. Nonobstructive bowel gas pattern without bowel dilatation or bowel wall thickening. Calcified granuloma within spleen. Lung bases clear. Bones demineralized. IMPRESSION: Increased stool throughout proximal half of colon. No acute abnormalities. Electronically Signed   By: Lavonia Dana M.D.   On: 08/05/2019 18:55     Time spent: 35 minutes  Author: Berle Mull, MD [facility] 08/06/2019 10:17 AM  To reach On-call, see care teams to locate the attending and reach out to them via [email or web address]. If 7PM-7AM, please contact night-coverage If you still have difficulty reaching the attending provider, please page the [facility] (Director on Call) for [facility] on amion for assistance.

## 2019-08-06 NOTE — Progress Notes (Addendum)
Pt with minimal vomiting at breakfast, states the food "got stuck right here" indicating his throat. Left femoral dressing assessed and changed; slow but steady bleeding continues. New pressure dressing applied. BP 90s/30s; Dr. Juleen China at bedside and aware. Blood cultures ordered and flomax discontinued. Will continue to monitor. Ginger ale given.

## 2019-08-06 NOTE — Progress Notes (Signed)
Pt stable for PD tx vitals stable

## 2019-08-06 NOTE — Progress Notes (Signed)
Pharmacy Antibiotic Note  Scott [name] is a 58 y.o. male admitted on 07/31/2019 with osteomyelitis. He has ESRD for which he undergoes peritoneal dialysis and receives this treatment overnight. 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 He underwent a successful recanalization of the left lower extremity for limb salvage performed by Dr [name] on 11/17. On 07/19/2019 he was taken for surgery by podiatry and underwent I&D of the infected bone and soft tissue of the left first metatarsal and amputation of left second toe with partial ray resection.  Wound cultures were sent from both and grew Achromobacter spp (started on TMP/SMZ as outpatient) and MRSE.  He was receiving ceftazidime and vancomycin via peritoneal dialysis.    11/30 @ 1540 Vancomycin random  = 28 mcg/mL 12/2 @ 0620 Vancomycin random = 25 mcg/mL 12/4 @ 0512 Vancomycin random = 19 mcg/mL 12/6 @0535  Vancomycin random = 28 mcg/mL   Plan: 1. No dose to be given today. Will follow up with random level on 12/9 . Pt received PD 12/6. It was held on 12/5.  2. Continue bactrim 1 SS tab daily and ceftazidime 1gm IV q24h  Height: 5\' 9"  (175.3 cm) Weight: 210 lb 8.6 oz (95.5 kg) IBW/kg (Calculated) : 70.7  Temp (24hrs), Avg:99.5 F (37.5 C), Min:98.4 F (36.9 C), Max:101.3 F (38.5 C)  Recent Labs  Lab 08/03/19 0559 08/04/19 0512 08/05/19 0648 08/05/19 1910 08/05/19 1914 08/06/19 0535  WBC 9.8 10.5 12.8*  --  12.0* 12.1*  CREATININE 7.32* 7.47* 7.80*  --  8.57* 7.96*  LATICACIDVEN  --   --   --  0.9  --   --   VANCORANDOM  --  19  --   --   --  28    Estimated Creatinine Clearance: 11.3 mL/min (A) (by C-G formula based on SCr of 7.96 mg/dL (H)).    Antimicrobials this admission: 11/16 ceftriaxone >> 11/19 11/16 vancomycin >>  11/20 ceftazidime >> 11/30 SMX/TMP (patient reported not taking to med rec)  >>  Microbiology results: 11/13 SARS CoV-2: negative  11/18 WCx: Achromobacter (TMP/SMZ susc), MRSE.   11/17 MRSA PCR: negative  Thank you for allowing pharmacy to be a part of this patient's care.  [place] Resident 08/06/2019 9:51 AM

## 2019-08-06 NOTE — Progress Notes (Signed)
Writer noted bleeding out of femoral dressing at appx 0840 this am. Dressing removed to find saturated gauze but light bleeding from area. Area cleansed gently, redressed and pressure held. Will monitor.

## 2019-08-06 NOTE — Progress Notes (Signed)
2 Days Post-Op   Subjective/Chief Complaint: The patient complains of right lower extremity swelling and discomfort.  He is able to move his toes but has difficulty when he steps on the foot.       Objective: Vital signs in last 24 hours: Temp:  [98.4 F (36.9 C)-101.3 F (38.5 C)] 98.4 F (36.9 C) (12/06 1203) Pulse Rate:  [71-80] 74 (12/06 1203) Resp:  [16-20] 20 (12/06 1203) BP: (92-101)/(33-48) 97/34 (12/06 1203) SpO2:  [97 %-100 %] 100 % (12/06 1203) Weight:  [95.5 kg] 95.5 kg (12/06 0532) Last BM Date: 08/02/19  Intake/Output from previous day: 12/05 0701 - 12/06 0700 In: 240 [P.O.:240] Out: 1044 [Urine:400] Intake/Output this shift: Total I/O In: -  Out: 660 [Other:660] 2 General appearance: alert, cooperative, appears older than stated age and no distress Head: Normocephalic, without obvious abnormality, atraumatic Eyes: conjunctivae/corneas clear. PERRL, EOM's intact. Fundi benign. Ears: normal TM's and external ear canals both ears Nose: Nares normal. Septum midline. Mucosa normal. No drainage or sinus tenderness. Skin: Skin color, texture, turgor normal. No rashes or lesions Incision/Wound: Left groin dressing intact with no active bleeding.  Lab Results:  Recent Labs    08/05/19 1914 08/06/19 0535  WBC 12.0* 12.1*  HGB 7.4* 7.8*  HCT 23.2* 25.3*  PLT 215 227   BMET Recent Labs    08/05/19 1914 08/06/19 0535  NA 135 140  K 4.8 4.6  CL 101 101  CO2 24 27  GLUCOSE 105* 96  BUN 53* 49*  CREATININE 8.57* 7.96*  CALCIUM 7.8* 8.5*   PT/INR No results for input(s): LABPROT, INR in the last 72 hours. ABG No results for input(s): PHART, HCO3 in the last 72 hours.  Invalid input(s): PCO2, PO2  Studies/Results: Dg Abd Portable 1v  Result Date: 08/05/2019 CLINICAL DATA:  Abdominal pain, difficulty urinating, history chronic kidney disease, CHF, hypertension, type II diabetes mellitus, GERD EXAM: PORTABLE ABDOMEN - 1 VIEW COMPARISON:  None FINDINGS:  Peritoneal dialysis catheter projects over upper pelvis. Density within urinary bladder consistent with excreted contrast material (post angiography on 08/04/2019). Increased stool throughout proximal half of colon. Small amount gas and stool within rectum. Nonobstructive bowel gas pattern without bowel dilatation or bowel wall thickening. Calcified granuloma within spleen. Lung bases clear. Bones demineralized. IMPRESSION: Increased stool throughout proximal half of colon. No acute abnormalities. Electronically Signed   By: Lavonia Dana M.D.   On: 08/05/2019 18:55    Anti-infectives: Anti-infectives (From admission, onward)   Start     Dose/Rate Route Frequency Ordered Stop   08/05/19 1000  vancomycin (VANCOCIN) IVPB 1000 mg/200 mL premix     1,000 mg 200 mL/hr over 60 Minutes Intravenous  Once 08/04/19 1144 08/05/19 1506   08/04/19 0950  clindamycin (CLEOCIN) 300 MG/50ML IVPB    Note to Pharmacy: Maynor, Erin   : cabinet override      08/04/19 0950 08/04/19 2159   08/04/19 0730  clindamycin (CLEOCIN) IVPB 300 mg    Note to Pharmacy: To be given in specials   300 mg 100 mL/hr over 30 Minutes Intravenous  Once 08/04/19 0715 08/04/19 1057   08/01/19 1245  ceFAZolin (ANCEF) IVPB 1 g/50 mL premix    Note to Pharmacy: To be given in specials   1 g 100 mL/hr over 30 Minutes Intravenous  Once 08/01/19 1234 08/01/19 1426   07/31/19 1600  cefTAZidime (FORTAZ) 1 g in sodium chloride 0.9 % 100 mL IVPB     1 g 200 mL/hr  over 30 Minutes Intravenous Every 24 hours 07/31/19 1520     07/31/19 1530  vancomycin variable dose per unstable renal function (pharmacist dosing)      Does not apply See admin instructions 07/31/19 1530     07/31/19 1215  sulfamethoxazole-trimethoprim (BACTRIM) 400-80 MG per tablet 1 tablet     1 tablet Oral Daily 07/31/19 1021     07/31/19 1030  cefTAZidime (FORTAZ) IVPB  Status:  Discontinued    Note to Pharmacy: Indication:  cellulitis Last Day of Therapy:  08/07/19 Please  Administer this Antibiotic Intraperitoneally     1 g Intravenous Every 24 hours 07/31/19 1021 07/31/19 1514   07/31/19 1030  vancomycin IVPB  Status:  Discontinued    Note to Pharmacy: Indication:  cellulits Last Day of Therapy:  08/04/19 Please Administer Antibiotic Intraperitoneally     2,000 mg Intravenous 2 times weekly 07/31/19 1021 07/31/19 1529      Assessment/Plan: s/p Procedure(s): Lower Extremity Angiography (Pedal Approach) (Right) Continue with dressing changes.  We will start Betadine to the right great toe necrotic tip daily.  The right pedal access dressing will be changed as well.  LOS: 6 days    Elmore Guise 08/06/2019

## 2019-08-06 NOTE — Progress Notes (Signed)
Right pedal access dressing changed and site cleaned. Right great toe swabbed with betadine and right lower extremity elevated. Left groin dressing changed and gentamicin applied. Will continue to monitor.

## 2019-08-06 NOTE — Progress Notes (Signed)
[facility]  ROUNDING NOTE   Subjective:   Peritoneal dialysis treatment last night. Tolerated treatment well. UF of 622mL. 1.5% dextrose  Tmax 101.3  Patient states he feels weak, tired and has nausea/vomiting episode this morning.   Objective:  Vital signs in last 24 hours:  Temp:  [98.4 F (36.9 C)-101.3 F (38.5 C)] 98.7 F (37.1 C) (12/06 0940) Pulse Rate:  [71-80] 75 (12/06 0940) Resp:  [16-19] 16 (12/06 0810) BP: (92-101)/(33-48) 96/33 (12/06 0940) SpO2:  [97 %-100 %] 97 % (12/06 0940) Weight:  [95.5 kg] 95.5 kg (12/06 0532)  Weight change: 3.6 kg Filed Weights   08/02/19 0424 08/04/19 0934 08/06/19 0532  Weight: 91.9 kg 91.9 kg 95.5 kg    Intake/Output: I/O last 3 completed shifts: In: 400.8 [P.O.:240; I.V.:160.8] Out: 1044 [Urine:400; Other:644]   Intake/Output this shift:  Total I/O In: -  Out: 660 [Other:660]  Physical Exam: General: NAD   Head: Normocephalic, atraumatic. Moist oral mucosal membranes  Eyes: Anicteric, PERRL  Neck: Supple, trachea midline  Lungs:  Clear to auscultation  Heart: Regular rate and rhythm  Abdomen:  Soft, nontender,   Extremities:  Left foot in clean and dry dressings Right foot +erythema and +edema  Neurologic: Nonfocal, moving all four extremities  Skin: No lesions  Access: PD catheter    Basic Metabolic Panel: Recent Labs  Lab 08/03/19 0559 08/04/19 0512 08/05/19 0648 08/05/19 1914 08/06/19 0535  NA 139 138 138 135 140  K 4.4 4.2 4.5 4.8 4.6  CL 102 100 102 101 101  CO2 25 27 24 24 27   GLUCOSE 121* 117* 107* 105* 96  BUN 49* 44* 46* 53* 49*  CREATININE 7.32* 7.47* 7.80* 8.57* 7.96*  CALCIUM 8.3* 8.4* 8.0* 7.8* 8.5*  MG 2.5*  --   --   --   --     Liver Function Tests: Recent Labs  Lab 07/31/19 0817 08/01/19 0519 08/05/19 1914  AST 23 14* 20  ALT 19 14 10   ALKPHOS 110 91 92  BILITOT 0.8 0.7 0.6  PROT 6.4* 5.5* 5.4*  ALBUMIN 2.6* 2.2* 2.1*   Recent Labs  Lab 08/05/19 1914   LIPASE 22   No results for input(s): AMMONIA in the last 168 hours.  CBC: Recent Labs  Lab 08/02/19 0620 08/03/19 0559 08/04/19 0512 08/05/19 0648 08/05/19 1914 08/06/19 0535  WBC 9.7 9.8 10.5 12.8* 12.0* 12.1*  NEUTROABS 7.4  --   --   --   --   --   HGB 7.7* 7.7* 8.3* 7.7* 7.4* 7.8*  HCT 24.6* 24.0* 25.9* 24.0* 23.2* 25.3*  MCV 98.0 94.9 94.5 94.5 95.1 96.2  PLT 244 255 258 232 215 227    Cardiac Enzymes: No results for input(s): CKTOTAL, CKMB, CKMBINDEX, TROPONINI in the last 168 hours.  BNP: Invalid input(s): POCBNP  CBG: Recent Labs  Lab 08/05/19 0754 08/05/19 1216 08/05/19 1644 08/05/19 2133 08/06/19 0812  GLUCAP 109* 181* 89 155* 29    Microbiology: Results for orders placed or performed during the hospital encounter of 07/28/19  SARS CORONAVIRUS 2 (TAT 6-24 HRS) Nasopharyngeal Nasopharyngeal Swab     Status: None   Collection Time: 07/28/19 11:24 AM   Specimen: Nasopharyngeal Swab  Result Value Ref Range Status   SARS Coronavirus 2 NEGATIVE NEGATIVE Final    Comment: (NOTE) SARS-CoV-2 target nucleic acids are NOT DETECTED. The SARS-CoV-2 RNA is generally detectable in upper and lower respiratory specimens during the acute phase of infection. Negative results do  not preclude SARS-CoV-2 infection, do not rule out co-infections with other pathogens, and should not be used as the sole basis for treatment or other patient management decisions. Negative results must be combined with clinical observations, patient history, and epidemiological information. The expected result is Negative. Fact Sheet for Patients: SugarRoll.be Fact Sheet for Healthcare Providers: https://www.woods-mathews.com/ This test is not yet approved or cleared by the [place] FDA and  has been authorized for detection and/or diagnosis of SARS-CoV-2 by FDA under an Emergency Use Authorization (EUA). This EUA will remain  in effect  (meaning this test can be used) for the duration of the COVID-19 declaration under Section 56 4(b)(1) of the Act, 21 U.S.C. section 360bbb-3(b)(1), unless the authorization is terminated or revoked sooner. Performed at [facility], [place] [address], [place], [place] [address]     Coagulation Studies: No results for input(s): LABPROT, INR in the last 72 hours.  Urinalysis: No results for input(s): COLORURINE, LABSPEC, PHURINE, GLUCOSEU, HGBUR, BILIRUBINUR, KETONESUR, PROTEINUR, UROBILINOGEN, NITRITE, LEUKOCYTESUR in the last 72 hours.  Invalid input(s): APPERANCEUR    Imaging: Dg Abd Portable 1v  Result Date: 08/05/2019 CLINICAL DATA:  Abdominal pain, difficulty urinating, history chronic kidney disease, CHF, hypertension, type II diabetes mellitus, GERD EXAM: PORTABLE ABDOMEN - 1 VIEW COMPARISON:  None FINDINGS: Peritoneal dialysis catheter projects over upper pelvis. Density within urinary bladder consistent with excreted contrast material (post angiography on 08/04/2019). Increased stool throughout proximal half of colon. Small amount gas and stool within rectum. Nonobstructive bowel gas pattern without bowel dilatation or bowel wall thickening. Calcified granuloma within spleen. Lung bases clear. Bones demineralized. IMPRESSION: Increased stool throughout proximal half of colon. No acute abnormalities. Electronically Signed   By: Lavonia Dana M.D.   On: 08/05/2019 18:55     Medications:   . cefTAZidime (FORTAZ)  IV Stopped (08/04/19 1623)  . dialysis solution 1.5% low-MG/low-CA     . aspirin EC  81 mg Oral Daily  . atorvastatin  40 mg Oral Daily  . calcitRIOL  0.25 mcg Oral Daily  . calcium acetate  2,001 mg Oral TID WC  . clopidogrel  75 mg Oral Q breakfast  . darbepoetin (ARANESP) injection - NON-DIALYSIS  40 mcg Subcutaneous Q Sat-1800  . gabapentin  300 mg Oral BID  . gentamicin cream  1 application Topical Daily  . insulin aspart  0-5 Units Subcutaneous QHS  .  insulin aspart  0-9 Units Subcutaneous TID WC  . insulin aspart  2 Units Subcutaneous TID WC  . lactulose  20 g Oral Daily  . pantoprazole  40 mg Oral Daily  . polyethylene glycol  17 g Oral BID  . senna-docusate  2 tablet Oral BID  . sulfamethoxazole-trimethoprim  1 tablet Oral Daily  . vancomycin variable dose per unstable renal function (pharmacist dosing)   Does not apply See admin instructions   acetaminophen **OR** acetaminophen, albuterol, bisacodyl, calcium acetate, methocarbamol, morphine injection, ondansetron **OR** ondansetron (ZOFRAN) IV, oxyCODONE  Assessment/ Plan:  [name] is a 58 y.o. white male with end stage renal disease on peritoneal dialysis, hypertension, diabetes mellitus type II, peripheral vascular disease, left toe amputation, who is admitted to [facility] on 07/31/2019 for Right leg pain [M79.604] Dry gangrene ([place]) [I96] Ischemic leg pain [M79.606, I99.8]  [place] PD CAPD 4 exchanges 2050mL fills  1. End Stage Renal Disease: peritoneal dialysis for tonight. CCPD 10 hours 4 exchanges 2 liter fills - 1.5% dextrose  2. Peripheral vascular  disease: left lower extremity healing well. However now with erythema and ischemic changes to his right first toe.  Discharged on IP ceftazidime and vancomycin. He was administrating this at home.  Vascular intervention on 12/1 and 12/4 by Dr. [name] - TMP/SMX, vanco, ceftazidime.  - Appreciate ID, podiatry and vascular input.     3. Hypertension:  - carvedilol, hydralazine and isosorbide mononitrate  4. Anemia of chronic kidney disease:   - Aranesp 28mcg administered on 12/5 - pharmacy consulted  5. Secondary Hyperparathyroidism with hypocalcemia.   - calcitriol - calcium acetate with meals.    LOS: 6 Scott [name] 12/6/202010:50 AM

## 2019-08-06 NOTE — Progress Notes (Signed)
Pt stated earlier in the shift that on days he tried not to take any pain medication ,but did state that his right leg and foot was hurting. Asked pain rating and pt stated that it was a 10. Educated the pt on the purpose of the pain medicine.

## 2019-08-06 NOTE — Progress Notes (Signed)
Post CCPD TX, tolerated well uf 632mL   08/06/19 0815  Completion  Effluent Appearance Clear;Yellow  Cell Count on Daytime Exchange N/A  Treatment Status Complete  Fluid Balance - CCPD  Total Output for Exchanges (mL) 660 ml  Procedure Comments  Tolerated treatment well? Yes  Hand-Off documentation  Report given to (Full Name) [place], RN   Report received from (Full Name) Beatris Ship, RN      08/06/19 0815  Completion  Effluent Appearance Clear;Yellow  Cell Count on Daytime Exchange N/A  Treatment Status Complete  Fluid Balance - CCPD  Total Output for Exchanges (mL) 660 ml  Procedure Comments  Tolerated treatment well? Yes  Hand-Off documentation  Report given to (Full Name) Vikki Ports, RN   Report received from (Full Name) Beatris Ship, RN

## 2019-08-07 ENCOUNTER — Ambulatory Visit (INDEPENDENT_AMBULATORY_CARE_PROVIDER_SITE_OTHER): Payer: Commercial Managed Care - HMO | Admitting: Vascular Surgery

## 2019-08-07 DIAGNOSIS — G253 Myoclonus: Secondary | ICD-10-CM

## 2019-08-07 LAB — CBC
HCT: 24.6 % — ABNORMAL LOW (ref 39.0–52.0)
HCT: 23.5 % — ABNORMAL LOW (ref 39.0–52.0)
Hemoglobin: 7.7 g/dL — ABNORMAL LOW (ref 13.0–17.0)
Hemoglobin: 7.2 g/dL — ABNORMAL LOW (ref 13.0–17.0)
MCH: 30.2 pg (ref 26.0–34.0)
MCH: 30.6 pg (ref 26.0–34.0)
MCHC: 31.3 g/dL (ref 30.0–36.0)
MCHC: 30.6 g/dL (ref 30.0–36.0)
MCV: 96.5 fL (ref 80.0–100.0)
MCV: 100 fL (ref 80.0–100.0)
Platelets: 208 10*3/uL (ref 150–400)
Platelets: 209 10*3/uL (ref 150–400)
RBC: 2.55 MIL/uL — ABNORMAL LOW (ref 4.22–5.81)
RBC: 2.35 MIL/uL — ABNORMAL LOW (ref 4.22–5.81)
RDW: 16.4 % — ABNORMAL HIGH (ref 11.5–15.5)
RDW: 16.4 % — ABNORMAL HIGH (ref 11.5–15.5)
WBC: 13.6 10*3/uL — ABNORMAL HIGH (ref 4.0–10.5)
WBC: 12.1 10*3/uL — ABNORMAL HIGH (ref 4.0–10.5)
nRBC: 0 % (ref 0.0–0.2)
nRBC: 0 % (ref 0.0–0.2)

## 2019-08-07 LAB — BASIC METABOLIC PANEL
Anion gap: 10 (ref 5–15)
BUN: 55 mg/dL — ABNORMAL HIGH (ref 6–20)
CO2: 27 mmol/L (ref 22–32)
Calcium: 8.3 mg/dL — ABNORMAL LOW (ref 8.9–10.3)
Chloride: 102 mmol/L (ref 98–111)
Creatinine, Ser: 8.06 mg/dL — ABNORMAL HIGH (ref 0.61–1.24)
GFR calc Af Amer: 8 mL/min — ABNORMAL LOW (ref >60)
GFR calc non Af Amer: 7 mL/min — ABNORMAL LOW (ref >60)
Glucose, Bld: 126 mg/dL — ABNORMAL HIGH (ref 70–99)
Potassium: 4.6 mmol/L (ref 3.5–5.1)
Sodium: 139 mmol/L (ref 135–145)

## 2019-08-07 LAB — GLUCOSE, CAPILLARY
Glucose-Capillary: 137 mg/dL — ABNORMAL HIGH (ref 70–99)
Glucose-Capillary: 119 mg/dL — ABNORMAL HIGH (ref 70–99)
Glucose-Capillary: 143 mg/dL — ABNORMAL HIGH (ref 70–99)
Glucose-Capillary: 78 mg/dL (ref 70–99)

## 2019-08-07 LAB — BODY FLUID CELL COUNT WITH DIFFERENTIAL
Eos, Fluid: 3 %
Lymphs, Fluid: 11 %
Monocyte-Macrophage-Serous Fluid: 53 %
Neutrophil Count, Fluid: 33 %
Total Nucleated Cell Count, Fluid: 18 cu mm

## 2019-08-07 LAB — LACTIC ACID, PLASMA
Lactic Acid, Venous: 1.2 mmol/L (ref 0.5–1.9)
Lactic Acid, Venous: 1.4 mmol/L (ref 0.5–1.9)

## 2019-08-07 LAB — PATHOLOGIST SMEAR REVIEW

## 2019-08-07 MED ORDER — SODIUM CHLORIDE 0.9 % IV BOLUS
250.0000 mL | Freq: Once | INTRAVENOUS | Status: AC
  Administered 2019-08-07: 250 mL via INTRAVENOUS

## 2019-08-07 MED ORDER — ALBUMIN HUMAN 25 % IV SOLN
12.5000 g | Freq: Once | INTRAVENOUS | Status: AC
  Administered 2019-08-07: 12.5 g via INTRAVENOUS
  Filled 2019-08-07: qty 50

## 2019-08-07 MED ORDER — OXYCODONE-ACETAMINOPHEN 5-325 MG PO TABS
1.0000 | ORAL_TABLET | ORAL | Status: DC | PRN
  Administered 2019-08-08: 1 via ORAL
  Filled 2019-08-07: qty 1

## 2019-08-07 MED ORDER — MORPHINE SULFATE (PF) 4 MG/ML IV SOLN: 2.0000 mg | INTRAVENOUS | Status: DC | PRN

## 2019-08-07 MED ORDER — HYDROMORPHONE HCL 1 MG/ML IJ SOLN
0.5000 mg | INTRAMUSCULAR | Status: DC | PRN
  Administered 2019-08-08: 0.5 mg via INTRAVENOUS
  Filled 2019-08-07: qty 1

## 2019-08-07 MED ORDER — HEPARIN SODIUM (PORCINE) 5000 UNIT/ML IJ SOLN
5000.0000 [IU] | Freq: Three times a day (TID) | INTRAMUSCULAR | Status: DC
  Administered 2019-08-07 – 2019-08-12 (×13): 5000 [IU] via SUBCUTANEOUS
  Filled 2019-08-07 (×14): qty 1

## 2019-08-07 NOTE — Progress Notes (Signed)
ID Pt is a little drowsy, says he got a oxycodone  His nurse at bedside- says he had a temp of 100.1 and soft BP and was given a bolus  O/E Sleepy Responds to questions appropriately Some jerky movts left UE and LLE occasionally ( myoclonic ) Patient Vitals for the past 24 hrs:  BP Temp Temp src Pulse Resp SpO2 Weight  08/07/19 1856 (!) 98/48 - - - - - -  08/07/19 1826 (!) 90/34 - - 72 - - -  08/07/19 1743 (!) 88/40 99.5 F (37.5 C) Oral 72 - - -  08/07/19 1722 (!) 96/34 - - 75 - - -  08/07/19 1605 (!) 99/39 98.2 F (36.8 C) Oral 71 16 100 % -  08/07/19 1428 (!) 101/40 99.9 F (37.7 C) Oral 90 (!) 22 100 % -  08/07/19 0758 91/71 98.6 F (37 C) Oral 69 18 100 % -  08/07/19 0515 (!) 99/59 98 F (36.7 C) Oral 86 20 99 % -  08/07/19 0511 - - - - - - 93.5 kg   Chest b/l air entry HS s1s2 And soft- PD catheter in place Left foot-at the site of the rior amputaiton of the 1st and 2nd toes Eschar at the top suture area     Rt great toe has gangrene   Wet and mottled   Labs CBC Latest Ref Rng & Units 08/07/2019 08/07/2019 08/06/2019  WBC 4.0 - 10.5 K/uL 12.1(H) 13.6(H) 12.1(H)  Hemoglobin 13.0 - 17.0 g/dL 7.2(L) 7.7(L) 7.8(L)  Hematocrit 39.0 - 52.0 % 23.5(L) 24.6(L) 25.3(L)  Platelets 150 - 400 K/uL 209 208 227    CMP Latest Ref Rng & Units 08/07/2019 08/06/2019 08/05/2019  Glucose 70 - 99 mg/dL 126(H) 96 105(H)  BUN 6 - 20 mg/dL 55(H) 49(H) 53(H)  Creatinine 0.61 - 1.24 mg/dL 8.06(H) 7.96(H) 8.57(H)  Sodium 135 - 145 mmol/L 139 140 135  Potassium 3.5 - 5.1 mmol/L 4.6 4.6 4.8  Chloride 98 - 111 mmol/L 102 101 101  CO2 22 - 32 mmol/L 27 27 24   Calcium 8.9 - 10.3 mg/dL 8.3(L) 8.5(L) 7.8(L)  Total Protein 6.5 - 8.1 g/dL - - 5.4(L)  Total Bilirubin 0.3 - 1.2 mg/dL - - 0.6  Alkaline Phos 38 - 126 U/L - - 92  AST 15 - 41 U/L - - 20  ALT 0 - 44 U/L - - 10   Impression/recommendation  PAD:s /p Percutaneous transluminal angioplasty and stent placement rightsuperficial  femoral and popliteal arteries to 6 mm with Viabahn stents.. Percutaneous transluminal angioplastyright peroneal artery   Rt toe gangrene- becoming wet, angioplasty done ,  amputation planned  Left foot gangrene S/p Rt great toe and second ray excision Wound healing except for anarea of eschar at the distal end  ESRD on PD  Jerky movts like myoclonus- could be due to opioids- Discussed with his nurse at bed side and the primary team is reducing the dose

## 2019-08-07 NOTE — Progress Notes (Signed)
[facility]  ROUNDING NOTE   Subjective:   Peritoneal dialysis treatment last night. Tolerated treatment well.  Poor appetite Lethargic today but able to talk No nausea or vomiting No SOB   .   Objective:  Vital signs in last 24 hours:  Temp:  [98 F (36.7 C)-98.6 F (37 C)] 98.6 F (37 C) (12/07 0758) Pulse Rate:  [69-86] 69 (12/07 0758) Resp:  [18-20] 18 (12/07 0758) BP: (91-102)/(34-71) 91/71 (12/07 0758) SpO2:  [93 %-100 %] 100 % (12/07 0758) Weight:  [93.5 kg] 93.5 kg (12/07 0511)  Weight change: -1.968 kg Filed Weights   08/04/19 0934 08/06/19 0532 08/07/19 0511  Weight: 91.9 kg 95.5 kg 93.5 kg    Intake/Output: I/O last 3 completed shifts: In: -  Out: 1160 [Urine:500; Other:660]   Intake/Output this shift:  Total I/O In: -  Out: 625 [Other:625]  Physical Exam: General: NAD   Head: Normocephalic, atraumatic. Moist oral mucosal membranes  Eyes: Anicteric,    Lungs:  Clear to auscultation  Heart: Regular rate and rhythm  Abdomen:  Soft, nontender,   Extremities:  Left foot in clean and dry dressings Right toe gangrenous  Neurologic: Lethargic but able to answer questions  Access: PD catheter    Basic Metabolic Panel: Recent Labs  Lab 08/03/19 0559 08/04/19 0512 08/05/19 0648 08/05/19 1914 08/06/19 0535 08/07/19 0533  NA 139 138 138 135 140 139  K 4.4 4.2 4.5 4.8 4.6 4.6  CL 102 100 102 101 101 102  CO2 25 27 24 24 27 27   GLUCOSE 121* 117* 107* 105* 96 126*  BUN 49* 44* 46* 53* 49* 55*  CREATININE 7.32* 7.47* 7.80* 8.57* 7.96* 8.06*  CALCIUM 8.3* 8.4* 8.0* 7.8* 8.5* 8.3*  MG 2.5*  --   --   --   --   --     Liver Function Tests: Recent Labs  Lab 08/01/19 0519 08/05/19 1914  AST 14* 20  ALT 14 10  ALKPHOS 91 92  BILITOT 0.7 0.6  PROT 5.5* 5.4*  ALBUMIN 2.2* 2.1*   Recent Labs  Lab 08/05/19 1914  LIPASE 22   No results for input(s): AMMONIA in the last 168 hours.  CBC: Recent Labs  Lab 08/02/19 0620   08/04/19 0512 08/05/19 0648 08/05/19 1914 08/06/19 0535 08/07/19 0533  WBC 9.7   < > 10.5 12.8* 12.0* 12.1* 13.6*  NEUTROABS 7.4  --   --   --   --   --   --   HGB 7.7*   < > 8.3* 7.7* 7.4* 7.8* 7.7*  HCT 24.6*   < > 25.9* 24.0* 23.2* 25.3* 24.6*  MCV 98.0   < > 94.5 94.5 95.1 96.2 96.5  PLT 244   < > 258 232 215 227 208   < > = values in this interval not displayed.    Cardiac Enzymes: No results for input(s): CKTOTAL, CKMB, CKMBINDEX, TROPONINI in the last 168 hours.  BNP: Invalid input(s): POCBNP  CBG: Recent Labs  Lab 08/06/19 1245 08/06/19 1802 08/06/19 2120 08/07/19 0810 08/07/19 1104  GLUCAP 134* 109* 103* 137* 119*    Microbiology: Results for orders placed or performed during the hospital encounter of 07/31/19  CULTURE, BLOOD (ROUTINE X 2) w Reflex to ID Panel     Status: None (Preliminary result)   Collection Time: 08/06/19 10:37 AM   Specimen: BLOOD  Result Value Ref Range Status   Specimen Description BLOOD LAC  Final   Special Requests  Final    BOTTLES DRAWN AEROBIC AND ANAEROBIC Blood Culture adequate volume   Culture   Final    NO GROWTH < 24 HOURS Performed at [facility], [address] [place], [place] [address]    Report Status PENDING  Incomplete  CULTURE, BLOOD (ROUTINE X 2) w Reflex to ID Panel     Status: None (Preliminary result)   Collection Time: 08/06/19 10:37 AM   Specimen: BLOOD  Result Value Ref Range Status   Specimen Description BLOOD LEFT FOREARM  Final   Special Requests   Final    BOTTLES DRAWN AEROBIC AND ANAEROBIC Blood Culture results may not be optimal due to an inadequate volume of blood received in culture bottles   Culture   Final    NO GROWTH < 24 HOURS Performed at [facility], [address] [place], [place] [address]    Report Status PENDING  Incomplete    Coagulation Studies: No results for input(s): LABPROT, INR in the last 72 hours.  Urinalysis: No results for input(s):  COLORURINE, LABSPEC, PHURINE, GLUCOSEU, HGBUR, BILIRUBINUR, KETONESUR, PROTEINUR, UROBILINOGEN, NITRITE, LEUKOCYTESUR in the last 72 hours.  Invalid input(s): APPERANCEUR    Imaging: Dg Abd Portable 1v  Result Date: 08/05/2019 CLINICAL DATA:  Abdominal pain, difficulty urinating, history chronic kidney disease, CHF, hypertension, type II diabetes mellitus, GERD EXAM: PORTABLE ABDOMEN - 1 VIEW COMPARISON:  None FINDINGS: Peritoneal dialysis catheter projects over upper pelvis. Density within urinary bladder consistent with excreted contrast material (post angiography on 08/04/2019). Increased stool throughout proximal half of colon. Small amount gas and stool within rectum. Nonobstructive bowel gas pattern without bowel dilatation or bowel wall thickening. Calcified granuloma within spleen. Lung bases clear. Bones demineralized. IMPRESSION: Increased stool throughout proximal half of colon. No acute abnormalities. Electronically Signed   By: Lavonia Dana M.D.   On: 08/05/2019 18:55     Medications:   . cefTAZidime (FORTAZ)  IV 1 g (08/06/19 1817)  . dialysis solution 1.5% low-MG/low-CA     . aspirin EC  81 mg Oral Daily  . atorvastatin  40 mg Oral Daily  . calcitRIOL  0.25 mcg Oral Daily  . calcium acetate  2,001 mg Oral TID WC  . clopidogrel  75 mg Oral Q breakfast  . darbepoetin (ARANESP) injection - NON-DIALYSIS  40 mcg Subcutaneous Q Sat-1800  . gabapentin  300 mg Oral BID  . gentamicin cream  1 application Topical Daily  . heparin injection (subcutaneous)  5,000 Units Subcutaneous Q8H  . insulin aspart  0-5 Units Subcutaneous QHS  . insulin aspart  0-9 Units Subcutaneous TID WC  . insulin aspart  2 Units Subcutaneous TID WC  . lactulose  20 g Oral Daily  . pantoprazole  40 mg Oral Daily  . polyethylene glycol  17 g Oral BID  . senna-docusate  2 tablet Oral BID  . sulfamethoxazole-trimethoprim  1 tablet Oral Daily  . vancomycin variable dose per unstable renal function (pharmacist  dosing)   Does not apply See admin instructions   acetaminophen **OR** acetaminophen, albuterol, bisacodyl, calcium acetate, methocarbamol, morphine injection, ondansetron **OR** ondansetron (ZOFRAN) IV, oxyCODONE  Assessment/ Plan:  [name] is a 58 y.o. white male with end stage renal disease on peritoneal dialysis, hypertension, diabetes mellitus type II, peripheral vascular disease, left toe amputation, who is admitted to [facility] on 07/31/2019 for Right leg pain [M79.604] Dry gangrene ([place]) [I96] Ischemic leg pain [M79.606, I99.8]  [place] PD CAPD 4  exchanges 2063mL fills  1. End Stage Renal Disease: peritoneal dialysis for tonight. CCPD 10 hours 4 exchanges 2 liter fills - 1.5% dextrose  2. Peripheral vascular disease: left lower extremity healing well. However now with erythema and ischemic changes to his right first toe.  Discharged on IP ceftazidime and vancomycin. He was administering this at home.  Vascular intervention on 12/1 and 12/4 by Dr. [name] - TMP/SMX, vanco, ceftazidime.  -  Possible amputation of toe tomorrow  3.  Anemia of chronic kidney disease:   - Aranesp 2mcg administered on 12/5 - pharmacy consulted  4. Secondary Hyperparathyroidism with hypocalcemia.   - calcitriol - calcium acetate with meals.  Lab Results  Component Value Date   PTH 127 (H) 07/21/2019   CALCIUM 8.3 (L) 08/07/2019   PHOS 5.1 (H) 07/21/2019      LOS: 7 Ana Liaw 12/7/202011:10 AM

## 2019-08-07 NOTE — Progress Notes (Signed)
[facility] Daily Progress Note   Subjective: 08/04/19:  1.  Introduction catheter into right lower extremity 3rd order catheter placement left common femoral approach 2. Contrast injection right lower extremity for distal runoff  3.  Ultrasound-guided access to the right posterior tibial artery             4.   Percutaneous transluminal angioplasty and stent placement right superficial femoral and popliteal arteries to 6 mm with Viabahn stents 4. Percutaneous transluminal angioplasty right peroneal to 2.5  5. Star close closure left common femoral arteriotomy  08/01/19: 1. Introduction catheter intorightlower extremity 3rd order catheter placement  2.Contrast injectionrightlower extremity for distal runoff  3. Percutaneous transluminal angioplastyright profunda femorisartery 4. Star close closureleftcommon femoral arteriotomy  Patient continues to complain of RLE discomfort.   Objective: Vitals:   08/06/19 1937 08/07/19 0511 08/07/19 0515 08/07/19 0758  BP: (!) 102/39  (!) 99/59 91/71  Pulse: 79  86 69  Resp: 20  20 18   Temp: 98.3 F (36.8 C)  98 F (36.7 C) 98.6 F (37 C)  TempSrc: Oral  Oral Oral  SpO2: 100%  99% 100%  Weight:  93.5 kg    Height:        Intake/Output Summary (Last 24 hours) at 08/07/2019 1058 Last data filed at 08/07/2019 7564 Gross per 24 hour  Intake -  Output 725 ml  Net -725 ml   Physical Exam: A&Ox3, NAD CV: RRR Pulmonary: CTA Bilaterally Abdomen: Soft, Nontender, Nondistended  PD Catheter: intact, no signs of infection noted Vascular:  Right Lower Extremity: Thigh soft.  Calf soft.  Extremities warm distally to toes.  Extremity is warm and I compared to his physical exam last week.  First toe gangrene is stable.  Motor/sensory is intact.  No signs of compartment syndrome.   Laboratory: CBC     Component Value Date/Time   WBC 13.6 (H) 08/07/2019 0533   HGB 7.7 (L) 08/07/2019 0533   HGB 10.7 (L) 12/13/2014 0102   HCT 24.6 (L) 08/07/2019 0533   HCT 33.8 (L) 12/13/2014 0102   PLT 208 08/07/2019 0533   PLT 510 (H) 12/13/2014 0102   BMET    Component Value Date/Time   NA 139 08/07/2019 0533   NA 140 04/16/2015   NA 140 12/13/2014 0102   K 4.6 08/07/2019 0533   K 4.6 12/13/2014 0102   CL 102 08/07/2019 0533   CL 111 12/13/2014 0102   CO2 27 08/07/2019 0533   CO2 23 12/13/2014 0102   GLUCOSE 126 (H) 08/07/2019 0533   GLUCOSE 213 (H) 12/13/2014 0102   BUN 55 (H) 08/07/2019 0533   BUN 44 (A) 04/16/2015   BUN 25 (H) 12/13/2014 0102   CREATININE 8.06 (H) 08/07/2019 0533   CREATININE 1.95 (H) 12/13/2014 0102   CALCIUM 8.3 (L) 08/07/2019 0533   CALCIUM 8.0 (L) 12/13/2014 0102   GFRNONAA 7 (L) 08/07/2019 0533   GFRNONAA 37 (L) 12/13/2014 0102   GFRAA 8 (L) 08/07/2019 0533   GFRAA 43 (L) 12/13/2014 0102   Assessment/Planning: The patient is a 58 year old male with multiple medical issues including peripheral artery disease with gangrenous changes to the toes status post endovascular intervention x2 1) peripheral artery disease with gangrene: s/p endovascular intervention x2.  Patient now has two-vessel runoff.  The patient's physical exam is notable for nonpalpable pedal pulses however his distal calf and foot are much warmer when compared to last week's exam. 2) end-stage renal disease: Currently maintained  on peritoneal dialysis without issue. 3) gangrenous first toe: Possible amputation with podiatry tomorrow?  Discussed with Dr. Eber Hong  PA-C 08/07/2019 10:58 AM

## 2019-08-07 NOTE — H&P (View-Only) (Signed)
3 Days Post-Op   Subjective/Chief Complaint: Patient seen.  States the foot hurts like hell.  States it is not just the toe.   Objective: Vital signs in last 24 hours: Temp:  [98 F (36.7 C)-98.6 F (37 C)] 98.6 F (37 C) (12/07 0758) Pulse Rate:  [69-86] 69 (12/07 0758) Resp:  [18-20] 18 (12/07 0758) BP: (91-102)/(39-71) 91/71 (12/07 0758) SpO2:  [93 %-100 %] 100 % (12/07 0758) Weight:  [93.5 kg] 93.5 kg (12/07 0511) Last BM Date: 08/02/19  Intake/Output from previous day: 12/06 0701 - 12/07 0700 In: -  Out: 760 [Urine:100] Intake/Output this shift: Total I/O In: -  Out: 625 [Other:625]  Continued dry gangrenous changes at the distal aspect of the right hallux.  The foot actually feels relatively warm.  Still a little bit mottled and in the central aspect of the foot.  No significant drainage.  Lab Results:  Recent Labs    08/06/19 0535 08/07/19 0533  WBC 12.1* 13.6*  HGB 7.8* 7.7*  HCT 25.3* 24.6*  PLT 227 208   BMET Recent Labs    08/06/19 0535 08/07/19 0533  NA 140 139  K 4.6 4.6  CL 101 102  CO2 27 27  GLUCOSE 96 126*  BUN 49* 55*  CREATININE 7.96* 8.06*  CALCIUM 8.5* 8.3*   PT/INR No results for input(s): LABPROT, INR in the last 72 hours. ABG No results for input(s): PHART, HCO3 in the last 72 hours.  Invalid input(s): PCO2, PO2  Studies/Results: Dg Abd Portable 1v  Result Date: 08/05/2019 CLINICAL DATA:  Abdominal pain, difficulty urinating, history chronic kidney disease, CHF, hypertension, type II diabetes mellitus, GERD EXAM: PORTABLE ABDOMEN - 1 VIEW COMPARISON:  None FINDINGS: Peritoneal dialysis catheter projects over upper pelvis. Density within urinary bladder consistent with excreted contrast material (post angiography on 08/04/2019). Increased stool throughout proximal half of colon. Small amount gas and stool within rectum. Nonobstructive bowel gas pattern without bowel dilatation or bowel wall thickening. Calcified granuloma within  spleen. Lung bases clear. Bones demineralized. IMPRESSION: Increased stool throughout proximal half of colon. No acute abnormalities. Electronically Signed   By: Lavonia Dana M.D.   On: 08/05/2019 18:55    Anti-infectives: Anti-infectives (From admission, onward)   Start     Dose/Rate Route Frequency Ordered Stop   08/05/19 1000  vancomycin (VANCOCIN) IVPB 1000 mg/200 mL premix     1,000 mg 200 mL/hr over 60 Minutes Intravenous  Once 08/04/19 1144 08/05/19 1506   08/04/19 0950  clindamycin (CLEOCIN) 300 MG/50ML IVPB    Note to Pharmacy: Maynor, Erin   : cabinet override      08/04/19 0950 08/04/19 2159   08/04/19 0730  clindamycin (CLEOCIN) IVPB 300 mg    Note to Pharmacy: To be given in specials   300 mg 100 mL/hr over 30 Minutes Intravenous  Once 08/04/19 0715 08/04/19 1057   08/01/19 1245  ceFAZolin (ANCEF) IVPB 1 g/50 mL premix    Note to Pharmacy: To be given in specials   1 g 100 mL/hr over 30 Minutes Intravenous  Once 08/01/19 1234 08/01/19 1426   07/31/19 1600  cefTAZidime (FORTAZ) 1 g in sodium chloride 0.9 % 100 mL IVPB     1 g 200 mL/hr over 30 Minutes Intravenous Every 24 hours 07/31/19 1520     07/31/19 1530  vancomycin variable dose per unstable renal function (pharmacist dosing)      Does not apply See admin instructions 07/31/19 1530     07/31/19  1215  sulfamethoxazole-trimethoprim (BACTRIM) 400-80 MG per tablet 1 tablet     1 tablet Oral Daily 07/31/19 1021     07/31/19 1030  cefTAZidime (FORTAZ) IVPB  Status:  Discontinued    Note to Pharmacy: Indication:  cellulitis Last Day of Therapy:  08/07/19 Please Administer this Antibiotic Intraperitoneally     1 g Intravenous Every 24 hours 07/31/19 1021 07/31/19 1514   07/31/19 1030  vancomycin IVPB  Status:  Discontinued    Note to Pharmacy: Indication:  cellulits Last Day of Therapy:  08/04/19 Please Administer Antibiotic Intraperitoneally     2,000 mg Intravenous 2 times weekly 07/31/19 1021 07/31/19 1529       Assessment/Plan: s/p Procedure(s): Lower Extremity Angiography (Pedal Approach) (Right) Assessment: Gangrene right hallux with severe peripheral vascular disease.   Plan: Discussed with the patient that at this point I think we should proceed with amputation of the great toe.  Discussed that we will most likely have to take out part of his first metatarsal with a partial ray resection.  Discussed that no guarantees could be given as to the healing potential or outcome of his surgery.  Still at significant risk for limb loss.  Questions invited and answered.  Consent form for amputation right hallux with partial ray resection.  N.p.o. after midnight.  Plan for surgery tomorrow around noon  LOS: 7 days    Durward Fortes 08/07/2019

## 2019-08-07 NOTE — Progress Notes (Signed)
PT Cancellation Note  Patient Details Name: Scott [name] MRN: [id number] DOB: [birth year]   Cancelled Treatment:    Reason Eval/Treat Not Completed: Patient's level of consciousness;Fatigue/lethargy limiting ability to participate. Author attempted to see patient twice. First time pt was mid-breakfast and on telephone call with family. Second attempt, pt was asleep/lethargic, O2 doffed at 80%. Pt repositioned in bed, awakens briefly, but quickly falls back asleep. Pt returned to 2L/min [place], SpO2 at 93% after 2 minutes. Heels floated. RN made aware.   12:09 PM, 08/07/19 Etta Grandchild, PT, DPT Physical Therapist - [facility]  [phone] ([place])    [place] C 08/07/2019, 12:03 PM

## 2019-08-07 NOTE — Progress Notes (Signed)
Pd completed 

## 2019-08-07 NOTE — Progress Notes (Signed)
   08/07/19 2130  Neurological  Level of Consciousness Alert  Orientation Level Oriented X4  Respiratory  Respiratory Pattern Regular;Unlabored  Bilateral Breath Sounds Diminished  Cardiac  Pulse Regular  Heart Sounds S1, S2  pt stable for PD tx no c/os vitals stable

## 2019-08-07 NOTE — Progress Notes (Signed)
Physical Therapy Treatment Patient Details Name: Scott [name] MRN: [id number] DOB: 1961/06/14 Today's Date: 08/07/2019    History of Present Illness Scott [name] is a 62yoM who comes to [facility] ED after severe pain in Right foot. Pt scheduled to have RLE vascular surgery in a couple days, but his pain has been recently very intense. Pt was admitted recently with Left foot toe amputations on 11/18. Pt underwent Rt leg stent placement on 12/1. Pt also sustained a fall to floor while donning pants from hospital bed, all without injury, recanalization RLE for limb salvage 12/4.  PMH: CHF, CKD, CAD, PVD, HTN, DM, ESRD on PD overnight.    PT Comments    Pt in bed upon arrival and agreed to attempt LE therex and sitting EOB. Pt is progressing towards PT goals. Pt reporting 10/10 pain upon arrival, ended session reporting 6/10. Pt has had a lot of pain medication today prior to treatment and suspect lethargy during session secondary to medications. Pt lethargic t/o session often appearing to fall asleep after a few reps of an exercise however easily awoken. Pt performed supine and seated LE therex for improved strengthening and ROM. Pt tolerated sitting EOB ~5 min stating it felt good to sit up. Pt required min guard for safety for bed mobility. OOB mobility deferred this session secondary to lethargy and pain levels. Pt presents with decreased strength, ROM, balance, endurance and increased pain. Pt would benefit from con skilled acute PT to further improve deficits to maximize mobility.   Follow Up Recommendations  Home health PT;Supervision for mobility/OOB;Supervision - Intermittent     Equipment Recommendations  Wheelchair (measurements PT);Wheelchair cushion (measurements PT)    Recommendations for Other Services       Precautions / Restrictions Precautions Precautions: Fall Restrictions Weight Bearing Restrictions: Yes LLE Weight Bearing: Non weight bearing    Mobility  Bed  Mobility Overal bed mobility: Needs Assistance Bed Mobility: Supine to Sit;Sit to Supine     Supine to sit: Min guard Sit to supine: Min guard   General bed mobility comments: min guard for safety getting EOB and returning to supine, pt able to scoot up in bed with use of bed rails and increased time and effort  Transfers                 General transfer comment: deferred today secondary to pts pain levels and lethargy  Ambulation/Gait                 Stairs             Wheelchair Mobility    Modified Rankin (Stroke Patients Only)       Balance Overall balance assessment: Needs assistance Sitting-balance support: No upper extremity supported;Feet supported;Bilateral upper extremity supported Sitting balance-Leahy Scale: Good Sitting balance - Comments: steady sitting EOB                                    Cognition Arousal/Alertness: Lethargic;Suspect due to medications Behavior During Therapy: [facility] for tasks assessed/performed Overall Cognitive Status: Within Functional Limits for tasks assessed                                        Exercises Total Joint Exercises Ankle Circles/Pumps: AROM;Both;20 reps Heel Slides: AROM;Both;15 reps Hip ABduction/ADduction: AROM;Both;15 reps Straight Leg  Raises: AROM;Both;15 reps Long Arc Quad: AROM;Both;15 reps Marching in Standing: AROM;Both;15 reps;Seated Other Exercises Other Exercises: isometric hip ABD/ADD against therapist resistance in sitting, ankle circles and alphabet with RLE in sitting    General Comments        Pertinent Vitals/Pain Pain Score: 10-Worst pain ever Pain Location: foot/ankle, 6/10 end of session Pain Descriptors / Indicators: Aching;Sore Pain Intervention(s): Limited activity within patient's tolerance;Monitored during session;Premedicated before session;Repositioned    Home Living                      Prior Function             PT Goals (current goals can now be found in the care plan section) Progress towards PT goals: Progressing toward goals    Frequency    Min 2X/week      PT Plan Current plan remains appropriate    Co-evaluation              AM-PAC PT "6 Clicks" Mobility   Outcome Measure  Help needed turning from your back to your side while in a flat bed without using bedrails?: None Help needed moving from lying on your back to sitting on the side of a flat bed without using bedrails?: None Help needed moving to and from a bed to a chair (including a wheelchair)?: A Lot Help needed standing up from a chair using your arms (e.g., wheelchair or bedside chair)?: A Lot Help needed to walk in hospital room?: Total Help needed climbing 3-5 steps with a railing? : Total 6 Click Score: 14    End of Session Equipment Utilized During Treatment: Oxygen Activity Tolerance: Patient limited by pain;Patient limited by lethargy Patient left: in bed;with nursing/sitter in room;with call bell/phone within reach Nurse Communication: Mobility status PT Visit Diagnosis: Unsteadiness on feet (R26.81);Muscle weakness (generalized) (M62.81);Difficulty in walking, not elsewhere classified (R26.2);Other abnormalities of gait and mobility (R26.89)     Time: 2800-3491 PT Time Calculation (min) (ACUTE ONLY): 21 min  Charges:  $Therapeutic Exercise: 8-22 mins                     Zachary George PT, DPT 3:51 PM,08/07/19 [phone]    Scott [name] 08/07/2019, 3:47 PM

## 2019-08-07 NOTE — Progress Notes (Signed)
3 Days Post-Op   Subjective/Chief Complaint: Patient seen.  States the foot hurts like hell.  States it is not just the toe.   Objective: Vital signs in last 24 hours: Temp:  [98 F (36.7 C)-98.6 F (37 C)] 98.6 F (37 C) (12/07 0758) Pulse Rate:  [69-86] 69 (12/07 0758) Resp:  [18-20] 18 (12/07 0758) BP: (91-102)/(39-71) 91/71 (12/07 0758) SpO2:  [93 %-100 %] 100 % (12/07 0758) Weight:  [93.5 kg] 93.5 kg (12/07 0511) Last BM Date: 08/02/19  Intake/Output from previous day: 12/06 0701 - 12/07 0700 In: -  Out: 760 [Urine:100] Intake/Output this shift: Total I/O In: -  Out: 625 [Other:625]  Continued dry gangrenous changes at the distal aspect of the right hallux.  The foot actually feels relatively warm.  Still a little bit mottled and in the central aspect of the foot.  No significant drainage.  Lab Results:  Recent Labs    08/06/19 0535 08/07/19 0533  WBC 12.1* 13.6*  HGB 7.8* 7.7*  HCT 25.3* 24.6*  PLT 227 208   BMET Recent Labs    08/06/19 0535 08/07/19 0533  NA 140 139  K 4.6 4.6  CL 101 102  CO2 27 27  GLUCOSE 96 126*  BUN 49* 55*  CREATININE 7.96* 8.06*  CALCIUM 8.5* 8.3*   PT/INR No results for input(s): LABPROT, INR in the last 72 hours. ABG No results for input(s): PHART, HCO3 in the last 72 hours.  Invalid input(s): PCO2, PO2  Studies/Results: Dg Abd Portable 1v  Result Date: 08/05/2019 CLINICAL DATA:  Abdominal pain, difficulty urinating, history chronic kidney disease, CHF, hypertension, type II diabetes mellitus, GERD EXAM: PORTABLE ABDOMEN - 1 VIEW COMPARISON:  None FINDINGS: Peritoneal dialysis catheter projects over upper pelvis. Density within urinary bladder consistent with excreted contrast material (post angiography on 08/04/2019). Increased stool throughout proximal half of colon. Small amount gas and stool within rectum. Nonobstructive bowel gas pattern without bowel dilatation or bowel wall thickening. Calcified granuloma within  spleen. Lung bases clear. Bones demineralized. IMPRESSION: Increased stool throughout proximal half of colon. No acute abnormalities. Electronically Signed   By: Lavonia Dana M.D.   On: 08/05/2019 18:55    Anti-infectives: Anti-infectives (From admission, onward)   Start     Dose/Rate Route Frequency Ordered Stop   08/05/19 1000  vancomycin (VANCOCIN) IVPB 1000 mg/200 mL premix     1,000 mg 200 mL/hr over 60 Minutes Intravenous  Once 08/04/19 1144 08/05/19 1506   08/04/19 0950  clindamycin (CLEOCIN) 300 MG/50ML IVPB    Note to Pharmacy: Maynor, Erin   : cabinet override      08/04/19 0950 08/04/19 2159   08/04/19 0730  clindamycin (CLEOCIN) IVPB 300 mg    Note to Pharmacy: To be given in specials   300 mg 100 mL/hr over 30 Minutes Intravenous  Once 08/04/19 0715 08/04/19 1057   08/01/19 1245  ceFAZolin (ANCEF) IVPB 1 g/50 mL premix    Note to Pharmacy: To be given in specials   1 g 100 mL/hr over 30 Minutes Intravenous  Once 08/01/19 1234 08/01/19 1426   07/31/19 1600  cefTAZidime (FORTAZ) 1 g in sodium chloride 0.9 % 100 mL IVPB     1 g 200 mL/hr over 30 Minutes Intravenous Every 24 hours 07/31/19 1520     07/31/19 1530  vancomycin variable dose per unstable renal function (pharmacist dosing)      Does not apply See admin instructions 07/31/19 1530     07/31/19  1215  sulfamethoxazole-trimethoprim (BACTRIM) 400-80 MG per tablet 1 tablet     1 tablet Oral Daily 07/31/19 1021     07/31/19 1030  cefTAZidime (FORTAZ) IVPB  Status:  Discontinued    Note to Pharmacy: Indication:  cellulitis Last Day of Therapy:  08/07/19 Please Administer this Antibiotic Intraperitoneally     1 g Intravenous Every 24 hours 07/31/19 1021 07/31/19 1514   07/31/19 1030  vancomycin IVPB  Status:  Discontinued    Note to Pharmacy: Indication:  cellulits Last Day of Therapy:  08/04/19 Please Administer Antibiotic Intraperitoneally     2,000 mg Intravenous 2 times weekly 07/31/19 1021 07/31/19 1529       Assessment/Plan: s/p Procedure(s): Lower Extremity Angiography (Pedal Approach) (Right) Assessment: Gangrene right hallux with severe peripheral vascular disease.   Plan: Discussed with the patient that at this point I think we should proceed with amputation of the great toe.  Discussed that we will most likely have to take out part of his first metatarsal with a partial ray resection.  Discussed that no guarantees could be given as to the healing potential or outcome of his surgery.  Still at significant risk for limb loss.  Questions invited and answered.  Consent form for amputation right hallux with partial ray resection.  N.p.o. after midnight.  Plan for surgery tomorrow around noon  LOS: 7 days    Durward Fortes 08/07/2019

## 2019-08-07 NOTE — Plan of Care (Signed)

## 2019-08-07 NOTE — Progress Notes (Addendum)
[facility] Progress Note  Patient: Scott [name] RCB:[id number]   PCP: [facility] DOB: [birth year]   DOA: 07/31/2019   DOS: 08/07/2019   Date of Service: the patient was seen and examined on 08/07/2019  Chief Complaint  Patient presents with  . Leg Pain   Brief hospital course: [name] Herseyis an 58 y.o.malewith medical history significant ofperipheral arterial disease, diabetes mellitus type 2, ESRD on peritoneal dialysis presents to the hospital with complaints of right lower extremity pain. Patient was admitted here last week for left lower extremity pain diagnosed with osteomyelitis requiring angiogram by vascular followed by amputation of left second toe by podiatry. He was eventually discharged on IV antibiotics. He had been feeling better but started experiencing right lower extremity pain over the last couple of days. Initially pain was with exertion but now has progressed to persistent and his toes have darkened. Underwent angioplasty of right profunda femoris artery on 08/01/2019. Angioplasty of right superficial femoral and popliteal arteries as well as right peroneal artery on 08/04/2019. Nephrology was consulted to continue peritoneal dialysis here in the hospital. Podiatry has evaluated the patient and currently recommending conservative measures but reevaluation on Monday. ID recommends continuing vancomycin plus cefepime plus Bactrim until 08/18/2019.  Currently further plan is continue IV antibiotics and monitor patient's progress. treat hypertension and amputation and monitor recovery after the procedure  Subjective: Reports uncontrolled pain but not using his pain medication.  No nausea no vomiting.  No fever no chills.  Blood pressure low throughout the day.  Assessment and Plan: Scheduled Meds: . aspirin EC  81 mg Oral Daily  . atorvastatin  40 mg Oral Daily  . calcitRIOL  0.25 mcg Oral Daily  . calcium acetate  2,001 mg Oral TID WC   . clopidogrel  75 mg Oral Q breakfast  . darbepoetin (ARANESP) injection - NON-DIALYSIS  40 mcg Subcutaneous Q Sat-1800  . gabapentin  300 mg Oral BID  . gentamicin cream  1 application Topical Daily  . heparin injection (subcutaneous)  5,000 Units Subcutaneous Q8H  . insulin aspart  0-5 Units Subcutaneous QHS  . insulin aspart  0-9 Units Subcutaneous TID WC  . insulin aspart  2 Units Subcutaneous TID WC  . lactulose  20 g Oral Daily  . pantoprazole  40 mg Oral Daily  . polyethylene glycol  17 g Oral BID  . senna-docusate  2 tablet Oral BID  . sulfamethoxazole-trimethoprim  1 tablet Oral Daily  . vancomycin variable dose per unstable renal function (pharmacist dosing)   Does not apply See admin instructions   Continuous Infusions: . albumin human    . cefTAZidime (FORTAZ)  IV 1 g (08/07/19 1455)  . dialysis solution 1.5% low-MG/low-CA    . sodium chloride     PRN Meds: acetaminophen **OR** acetaminophen, albuterol, bisacodyl, calcium acetate, HYDROmorphone (DILAUDID) injection, methocarbamol, ondansetron **OR** ondansetron (ZOFRAN) IV, oxyCODONE-acetaminophen  Right lower leg gangrene. Severe peripheral vascular disease Recent left toe osteomyelitis. Vascular surgery was consulted. Patient has undergone percutaneous angioplasty of multiple blood vessels in the right leg in a staged fashion on 12/1 and 08/04/2019. Was treated with IV heparin infusion as well. We will continue with antiplatelet therapy for now. Appreciate vascular surgery assistance in managing this patient. Monitor response to the treatment. Currently right foot appears to be warm to touch. While the pt reports pain in the leg he has not sued the pain meds as prescribed.  Informed the pt to use the pain meds  more frequently to allow for increased mobility.  Also increase gabapentin   Essential hypertension. Chronic systolic CHF Persistent hypotension Patient is on multiple antihypertensive medication. Blood  pressure currently remaining soft. Currently not on any antihypertensive medication. We will continue with Flomax for now. Treated with IV fluids as well as IV albumin.  Monitor. Other option would be to add midodrine but would like to hold off on it while the patient is undergoing procedures for amputation and vascular recanalization.  Anemia of chronic disease:  Monitor H&H and transfuse as needed.  ESRD on peritoneal dialysis:  Nephrology consulted. Appreciate their assistance in managing this patient. Continuing PD here at the hospital. If the patient has to go to SNF he will require to be transitioned to HD. Fistula on right extremity appears to be working.  Type 2 diabetes mellitus uncontrolled with hyperglycemia associated with vascular complication Continue with sliding scale insulin. Hemoglobin A1c 6.0 on 07/17/2019.  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  Continue aspirin and Lipitor  Recent osteomyelitis of left toe: Continue antibiotics (ceftazidime, vancomycin and Bactrim) Last day 08/18/2019.  Abdominal pain Severe constipation  Continue with the bowel regimen and add lactulose  Obesity  Body mass index is 30.45 kg/m.  Dietary consult  Diet: Regular diet  DVT Prophylaxis: Subcutaneous Heparin    Advance goals of care discussion: Full code  Family Communication: no family was present at bedside, at the time of interview.  Disposition:  Discharge to be determined.  Consultants: Vascular surgery, podiatry, nephrology, infectious disease Procedures: Angiography and angioplasty of the right leg vessels  Antibiotics: Anti-infectives (From admission, onward)   Start     Dose/Rate Route Frequency Ordered Stop   08/05/19 1000  vancomycin (VANCOCIN) IVPB 1000 mg/200 mL premix     1,000 mg 200 mL/hr over 60 Minutes Intravenous  Once 08/04/19 1144 08/05/19 1506   08/04/19 0950   clindamycin (CLEOCIN) 300 MG/50ML IVPB    Note to Pharmacy: Maynor, Erin   : cabinet override      08/04/19 0950 08/04/19 2159   08/04/19 0730  clindamycin (CLEOCIN) IVPB 300 mg    Note to Pharmacy: To be given in specials   300 mg 100 mL/hr over 30 Minutes Intravenous  Once 08/04/19 0715 08/04/19 1057   08/01/19 1245  ceFAZolin (ANCEF) IVPB 1 g/50 mL premix    Note to Pharmacy: To be given in specials   1 g 100 mL/hr over 30 Minutes Intravenous  Once 08/01/19 1234 08/01/19 1426   07/31/19 1600  cefTAZidime (FORTAZ) 1 g in sodium chloride 0.9 % 100 mL IVPB     1 g 200 mL/hr over 30 Minutes Intravenous Every 24 hours 07/31/19 1520     07/31/19 1530  vancomycin variable dose per unstable renal function (pharmacist dosing)      Does not apply See admin instructions 07/31/19 1530     07/31/19 1215  sulfamethoxazole-trimethoprim (BACTRIM) 400-80 MG per tablet 1 tablet     1 tablet Oral Daily 07/31/19 1021     07/31/19 1030  cefTAZidime (FORTAZ) IVPB  Status:  Discontinued    Note to Pharmacy: Indication:  cellulitis Last Day of Therapy:  08/07/19 Please Administer this Antibiotic Intraperitoneally     1 g Intravenous Every 24 hours 07/31/19 1021 07/31/19 1514   07/31/19 1030  vancomycin IVPB  Status:  Discontinued    Note to Pharmacy: Indication:  cellulits Last Day of  Therapy:  08/04/19 Please Administer Antibiotic Intraperitoneally     2,000 mg Intravenous 2 times weekly 07/31/19 1021 07/31/19 1529       Objective: Physical Exam: Vitals:   08/07/19 1605 08/07/19 1722 08/07/19 1743 08/07/19 1826  BP: (!) 99/39 (!) 96/34 (!) 88/40 (!) 90/34  Pulse: 71 75 72 72  Resp: 16     Temp: 98.2 F (36.8 C)  99.5 F (37.5 C)   TempSrc: Oral  Oral   SpO2: 100%     Weight:      Height:        Intake/Output Summary (Last 24 hours) at 08/07/2019 1850 Last data filed at 08/07/2019 1722 Gross per 24 hour  Intake 480 ml  Output 1025 ml  Net -545 ml   Filed Weights   08/04/19 0934 08/06/19  0532 08/07/19 0511  Weight: 91.9 kg 95.5 kg 93.5 kg   General: alert and oriented to time, place, and person. Appear in mild distress, affect appropriate Eyes: PERRL, Conjunctiva normal ENT: Oral Mucosa Clear, moist  Neck: no JVD, no Abnormal Mass Or lumps Cardiovascular: S1 and S2 Present, no Murmur,  Respiratory: good respiratory effort, Bilateral Air entry equal and Decreased, no signs of accessory muscle use, Clear to Auscultation, no Crackles, no wheezes Abdomen: Bowel Sound present, Soft and mild diffuse tenderness, no hernia Skin: no rashes, right leg redness and warmth Extremities: trace Pedal edema, no calf tenderness Neurologic: without any new focal findings gait not checked due to patient safety concerns  Data Reviewed: I have personally reviewed and interpreted daily labs, tele strips, imagings as discussed above. I reviewed all nursing notes, pharmacy notes, vitals, pertinent old records I have discussed plan of care as described above with RN and patient/family.  CBC: Recent Labs  Lab 08/02/19 0620  08/04/19 0512 08/05/19 0648 08/05/19 1914 08/06/19 0535 08/07/19 0533  WBC 9.7   < > 10.5 12.8* 12.0* 12.1* 13.6*  NEUTROABS 7.4  --   --   --   --   --   --   HGB 7.7*   < > 8.3* 7.7* 7.4* 7.8* 7.7*  HCT 24.6*   < > 25.9* 24.0* 23.2* 25.3* 24.6*  MCV 98.0   < > 94.5 94.5 95.1 96.2 96.5  PLT 244   < > 258 232 215 227 208   < > = values in this interval not displayed.   Basic Metabolic Panel: Recent Labs  Lab 08/03/19 0559 08/04/19 0512 08/05/19 0648 08/05/19 1914 08/06/19 0535 08/07/19 0533  NA 139 138 138 135 140 139  K 4.4 4.2 4.5 4.8 4.6 4.6  CL 102 100 102 101 101 102  CO2 25 27 24 24 27 27   GLUCOSE 121* 117* 107* 105* 96 126*  BUN 49* 44* 46* 53* 49* 55*  CREATININE 7.32* 7.47* 7.80* 8.57* 7.96* 8.06*  CALCIUM 8.3* 8.4* 8.0* 7.8* 8.5* 8.3*  MG 2.5*  --   --   --   --   --     Liver Function Tests: Recent Labs  Lab 08/01/19 0519 08/05/19 1914   AST 14* 20  ALT 14 10  ALKPHOS 91 92  BILITOT 0.7 0.6  PROT 5.5* 5.4*  ALBUMIN 2.2* 2.1*   Recent Labs  Lab 08/05/19 1914  LIPASE 22   No results for input(s): AMMONIA in the last 168 hours. Coagulation Profile: Recent Labs  Lab 08/01/19 0519  INR 1.3*   Cardiac Enzymes: No results for input(s): CKTOTAL, CKMB, CKMBINDEX, TROPONINI in  the last 168 hours. BNP (last 3 results) No results for input(s): PROBNP in the last 8760 hours. CBG: Recent Labs  Lab 08/06/19 1802 08/06/19 2120 08/07/19 0810 08/07/19 1104 08/07/19 1716  GLUCAP 109* 103* 137* 119* 143*   Studies: No results found.   Time spent: 35 minutes  Author: Berle Mull, MD [facility] 08/07/2019 6:50 PM  To reach On-call, see care teams to locate the attending and reach out to them via [email or web address]. If 7PM-7AM, please contact night-coverage If you still have difficulty reaching the attending provider, please page the [facility] (Director on Call) for [facility] on amion for assistance.

## 2019-08-08 ENCOUNTER — Inpatient Hospital Stay: Payer: Medicare Other | Admitting: Anesthesiology

## 2019-08-08 ENCOUNTER — Other Ambulatory Visit: Payer: Self-pay

## 2019-08-08 ENCOUNTER — Inpatient Hospital Stay: Payer: Commercial Managed Care - HMO | Admitting: Infectious Diseases

## 2019-08-08 ENCOUNTER — Encounter
Admission: EM | Disposition: A | Discharge status: Home / Self Care | Payer: Self-pay | Source: Home / Self Care | Attending: Internal Medicine

## 2019-08-08 ENCOUNTER — Ambulatory Visit: Payer: Commercial Managed Care - HMO | Admitting: Cardiology

## 2019-08-08 HISTORY — PX: AMPUTATION: SHX166

## 2019-08-08 LAB — RENAL FUNCTION PANEL
Albumin: 2.2 g/dL — ABNORMAL LOW (ref 3.5–5.0)
Anion gap: 11 (ref 5–15)
BUN: 51 mg/dL — ABNORMAL HIGH (ref 6–20)
CO2: 25 mmol/L (ref 22–32)
Calcium: 8.1 mg/dL — ABNORMAL LOW (ref 8.9–10.3)
Chloride: 100 mmol/L (ref 98–111)
Creatinine, Ser: 8.17 mg/dL — ABNORMAL HIGH (ref 0.61–1.24)
GFR calc Af Amer: 7 mL/min — ABNORMAL LOW (ref >60)
GFR calc non Af Amer: 6 mL/min — ABNORMAL LOW (ref >60)
Glucose, Bld: 103 mg/dL — ABNORMAL HIGH (ref 70–99)
Phosphorus: 5.1 mg/dL — ABNORMAL HIGH (ref 2.5–4.6)
Potassium: 4.5 mmol/L (ref 3.5–5.1)
Sodium: 136 mmol/L (ref 135–145)

## 2019-08-08 LAB — CBC
HCT: 24.2 % — ABNORMAL LOW (ref 39.0–52.0)
Hemoglobin: 7.3 g/dL — ABNORMAL LOW (ref 13.0–17.0)
MCH: 30.2 pg (ref 26.0–34.0)
MCHC: 30.2 g/dL (ref 30.0–36.0)
MCV: 100 fL (ref 80.0–100.0)
Platelets: 225 10*3/uL (ref 150–400)
RBC: 2.42 MIL/uL — ABNORMAL LOW (ref 4.22–5.81)
RDW: 15.9 % — ABNORMAL HIGH (ref 11.5–15.5)
WBC: 12 10*3/uL — ABNORMAL HIGH (ref 4.0–10.5)
nRBC: 0 % (ref 0.0–0.2)

## 2019-08-08 LAB — GLUCOSE, CAPILLARY
Glucose-Capillary: 114 mg/dL — ABNORMAL HIGH (ref 70–99)
Glucose-Capillary: 93 mg/dL (ref 70–99)
Glucose-Capillary: 92 mg/dL (ref 70–99)
Glucose-Capillary: 125 mg/dL — ABNORMAL HIGH (ref 70–99)
Glucose-Capillary: 120 mg/dL — ABNORMAL HIGH (ref 70–99)

## 2019-08-08 LAB — MAGNESIUM: Magnesium: 2.3 mg/dL (ref 1.7–2.4)

## 2019-08-08 LAB — RESPIRATORY PANEL BY RT PCR (FLU A&B, COVID)
Influenza A by PCR: NEGATIVE
Influenza B by PCR: NEGATIVE
SARS Coronavirus 2 by RT PCR: NEGATIVE

## 2019-08-08 SURGERY — AMPUTATION, FOOT, RAY
Anesthesia: General | Laterality: Right

## 2019-08-08 MED ORDER — PROPOFOL 500 MG/50ML IV EMUL
INTRAVENOUS | Status: AC
  Filled 2019-08-08: qty 50

## 2019-08-08 MED ORDER — FENTANYL CITRATE (PF) 100 MCG/2ML IJ SOLN
INTRAMUSCULAR | Status: AC
  Administered 2019-08-08: 25 ug via INTRAVENOUS
  Filled 2019-08-08: qty 2

## 2019-08-08 MED ORDER — VASOPRESSIN 20 UNIT/ML IV SOLN
INTRAVENOUS | Status: DC | PRN
  Administered 2019-08-08 (×2): 1 [IU] via INTRAVENOUS
  Administered 2019-08-08: 2 [IU] via INTRAVENOUS

## 2019-08-08 MED ORDER — MIDAZOLAM HCL 2 MG/2ML IJ SOLN
INTRAMUSCULAR | Status: AC
  Filled 2019-08-08: qty 2

## 2019-08-08 MED ORDER — SODIUM CHLORIDE 0.9 % IV SOLN
INTRAVENOUS | Status: DC | PRN
  Administered 2019-08-08: 40 ug/min via INTRAVENOUS

## 2019-08-08 MED ORDER — HYDROCODONE-ACETAMINOPHEN 5-325 MG PO TABS
1.0000 | ORAL_TABLET | Freq: Four times a day (QID) | ORAL | Status: DC | PRN
  Administered 2019-08-09 – 2019-08-11 (×5): 1 via ORAL
  Filled 2019-08-08 (×6): qty 1

## 2019-08-08 MED ORDER — SODIUM CHLORIDE FLUSH 0.9 % IV SOLN
INTRAVENOUS | Status: AC
  Filled 2019-08-08: qty 10

## 2019-08-08 MED ORDER — PHENYLEPHRINE HCL (PRESSORS) 10 MG/ML IV SOLN
INTRAVENOUS | Status: DC | PRN
  Administered 2019-08-08: 100 ug via INTRAVENOUS
  Administered 2019-08-08: 200 ug via INTRAVENOUS

## 2019-08-08 MED ORDER — FENTANYL CITRATE (PF) 100 MCG/2ML IJ SOLN
INTRAMUSCULAR | Status: DC | PRN
  Administered 2019-08-08: 25 ug via INTRAVENOUS

## 2019-08-08 MED ORDER — LIDOCAINE HCL (PF) 1 % IJ SOLN
INTRAMUSCULAR | Status: AC
  Filled 2019-08-08: qty 30

## 2019-08-08 MED ORDER — CEFAZOLIN SODIUM-DEXTROSE 2-3 GM-%(50ML) IV SOLR
INTRAVENOUS | Status: DC | PRN
  Administered 2019-08-08: 2 g via INTRAVENOUS

## 2019-08-08 MED ORDER — EPHEDRINE SULFATE 50 MG/ML IJ SOLN
INTRAMUSCULAR | Status: AC
  Filled 2019-08-08: qty 1

## 2019-08-08 MED ORDER — SODIUM CHLORIDE 0.9 % IV BOLUS
250.0000 mL | Freq: Once | INTRAVENOUS | Status: AC
  Administered 2019-08-08: 250 mL via INTRAVENOUS

## 2019-08-08 MED ORDER — FENTANYL CITRATE (PF) 100 MCG/2ML IJ SOLN
INTRAMUSCULAR | Status: AC
  Filled 2019-08-08: qty 2

## 2019-08-08 MED ORDER — LIDOCAINE HCL (CARDIAC) PF 100 MG/5ML IV SOSY
PREFILLED_SYRINGE | INTRAVENOUS | Status: DC | PRN
  Administered 2019-08-08: 100 mg via INTRAVENOUS

## 2019-08-08 MED ORDER — LIDOCAINE HCL (PF) 2 % IJ SOLN
INTRAMUSCULAR | Status: AC
  Filled 2019-08-08: qty 10

## 2019-08-08 MED ORDER — OXYCODONE-ACETAMINOPHEN 5-325 MG PO TABS
1.0000 | ORAL_TABLET | ORAL | Status: DC | PRN
  Administered 2019-08-08 – 2019-08-09 (×2): 2 via ORAL
  Filled 2019-08-08 (×2): qty 2

## 2019-08-08 MED ORDER — ALBUMIN HUMAN 5 % IV SOLN
12.5000 g | Freq: Once | INTRAVENOUS | Status: AC
  Administered 2019-08-08: 16:00:00 12.5 g via INTRAVENOUS
  Filled 2019-08-08: qty 250

## 2019-08-08 MED ORDER — ONDANSETRON HCL 4 MG/2ML IJ SOLN: 4.0000 mg | Freq: Once | INTRAMUSCULAR | Status: DC | PRN

## 2019-08-08 MED ORDER — PROPOFOL 10 MG/ML IV BOLUS
INTRAVENOUS | Status: DC | PRN
  Administered 2019-08-08: 100 mg via INTRAVENOUS

## 2019-08-08 MED ORDER — BUPIVACAINE-EPINEPHRINE (PF) 0.5% -1:200000 IJ SOLN
INTRAMUSCULAR | Status: AC
  Filled 2019-08-08: qty 30

## 2019-08-08 MED ORDER — MIDODRINE HCL 5 MG PO TABS
5.0000 mg | ORAL_TABLET | Freq: Three times a day (TID) | ORAL | Status: DC
  Administered 2019-08-08 – 2019-08-09 (×2): 5 mg via ORAL
  Filled 2019-08-08 (×2): qty 1

## 2019-08-08 MED ORDER — FENTANYL CITRATE (PF) 100 MCG/2ML IJ SOLN
25.0000 ug | INTRAMUSCULAR | Status: DC | PRN
  Administered 2019-08-08 (×4): 25 ug via INTRAVENOUS

## 2019-08-08 MED ORDER — EPHEDRINE SULFATE 50 MG/ML IJ SOLN
5.0000 mg | Freq: Once | INTRAMUSCULAR | Status: AC
  Administered 2019-08-08: 5 mg via INTRAVENOUS

## 2019-08-08 MED ORDER — BUPIVACAINE HCL (PF) 0.5 % IJ SOLN
INTRAMUSCULAR | Status: AC
  Filled 2019-08-08: qty 30

## 2019-08-08 MED ORDER — SODIUM CHLORIDE 0.9 % IV SOLN
INTRAVENOUS | Status: DC | PRN
  Administered 2019-08-08: 13:00:00 via INTRAVENOUS

## 2019-08-08 MED ORDER — CEFAZOLIN SODIUM-DEXTROSE 2-4 GM/100ML-% IV SOLN
INTRAVENOUS | Status: AC
  Filled 2019-08-08: qty 100

## 2019-08-08 MED ORDER — MIDAZOLAM HCL 2 MG/2ML IJ SOLN
INTRAMUSCULAR | Status: DC | PRN
  Administered 2019-08-08: 2 mg via INTRAVENOUS

## 2019-08-08 MED ORDER — PHENYLEPHRINE HCL (PRESSORS) 10 MG/ML IV SOLN
INTRAVENOUS | Status: AC
  Filled 2019-08-08: qty 1

## 2019-08-08 MED ORDER — ALBUMIN HUMAN 5 % IV SOLN
INTRAVENOUS | Status: AC
  Administered 2019-08-08: 12.5 g via INTRAVENOUS
  Filled 2019-08-08: qty 250

## 2019-08-08 MED ORDER — CHLORHEXIDINE GLUCONATE CLOTH 2 % EX PADS
6.0000 | MEDICATED_PAD | Freq: Every day | CUTANEOUS | Status: DC
  Administered 2019-08-08 – 2019-08-12 (×2): 6 via TOPICAL

## 2019-08-08 SURGICAL SUPPLY — 49 items
BLADE MED AGGRESSIVE (BLADE) ×3 IMPLANT
BLADE OSC/SAGITTAL MD 5.5X18 (BLADE) ×3 IMPLANT
BLADE SURG 15 STRL LF DISP TIS (BLADE) ×2 IMPLANT
BLADE SURG 15 STRL SS (BLADE) ×4
BLADE SURG MINI STRL (BLADE) ×3 IMPLANT
BNDG CONFORM 2 STRL LF (GAUZE/BANDAGES/DRESSINGS) ×3 IMPLANT
BNDG ELASTIC 4X5.8 VLCR STR LF (GAUZE/BANDAGES/DRESSINGS) ×3 IMPLANT
BNDG ESMARK 4X12 TAN STRL LF (GAUZE/BANDAGES/DRESSINGS) ×3 IMPLANT
BNDG GAUZE 4.5X4.1 6PLY STRL (MISCELLANEOUS) ×5 IMPLANT
CANISTER SUCT 1200ML W/VALVE (MISCELLANEOUS) ×3 IMPLANT
CLOSURE WOUND 1/4X4 (GAUZE/BANDAGES/DRESSINGS) ×1
COVER WAND RF STERILE (DRAPES) ×3 IMPLANT
CUFF TOURN 18 STER (MISCELLANEOUS) ×1 IMPLANT
CUFF TOURN DUAL PL 12 NO SLV (MISCELLANEOUS) ×1 IMPLANT
DRAPE FLUOR MINI C-ARM 54X84 (DRAPES) ×1 IMPLANT
DURAPREP 26ML APPLICATOR (WOUND CARE) ×3 IMPLANT
ELECT REM PT RETURN 9FT ADLT (ELECTROSURGICAL) ×3
ELECTRODE REM PT RTRN 9FT ADLT (ELECTROSURGICAL) ×1 IMPLANT
GAUZE 4X4 16PLY RFD (DISPOSABLE) ×2 IMPLANT
GAUZE SPONGE 4X4 12PLY STRL (GAUZE/BANDAGES/DRESSINGS) ×3 IMPLANT
GAUZE XEROFORM 1X8 LF (GAUZE/BANDAGES/DRESSINGS) ×3 IMPLANT
GLOVE BIO SURGEON STRL SZ7.5 (GLOVE) ×3 IMPLANT
GLOVE INDICATOR 8.0 STRL GRN (GLOVE) ×3 IMPLANT
GOWN STRL REUS W/ TWL LRG LVL3 (GOWN DISPOSABLE) ×2 IMPLANT
GOWN STRL REUS W/TWL LRG LVL3 (GOWN DISPOSABLE) ×4
HANDPIECE VERSAJET DEBRIDEMENT (MISCELLANEOUS) ×3 IMPLANT
KIT TURNOVER KIT A (KITS) ×3 IMPLANT
LABEL OR SOLS (LABEL) ×3 IMPLANT
NDL FILTER BLUNT 18X1 1/2 (NEEDLE) ×1 IMPLANT
NDL HYPO 25X1 1.5 SAFETY (NEEDLE) ×2 IMPLANT
NEEDLE FILTER BLUNT 18X 1/2SAF (NEEDLE) ×2
NEEDLE FILTER BLUNT 18X1 1/2 (NEEDLE) ×1 IMPLANT
NEEDLE HYPO 25X1 1.5 SAFETY (NEEDLE) ×6 IMPLANT
NS IRRIG 500ML POUR BTL (IV SOLUTION) ×3 IMPLANT
PACK EXTREMITY ARMC (MISCELLANEOUS) ×3 IMPLANT
PAD ABD DERMACEA PRESS 5X9 (GAUZE/BANDAGES/DRESSINGS) ×4 IMPLANT
SOL .9 NS 3000ML IRR  AL (IV SOLUTION) ×2
SOL .9 NS 3000ML IRR UROMATIC (IV SOLUTION) ×1 IMPLANT
SOL PREP PVP 2OZ (MISCELLANEOUS) ×3
SOLUTION PREP PVP 2OZ (MISCELLANEOUS) ×1 IMPLANT
STOCKINETTE STRL 6IN 960660 (GAUZE/BANDAGES/DRESSINGS) ×3 IMPLANT
STRIP CLOSURE SKIN 1/4X4 (GAUZE/BANDAGES/DRESSINGS) ×2 IMPLANT
SUT ETHILON 3-0 FS-10 30 BLK (SUTURE) ×3
SUT ETHILON 4-0 (SUTURE)
SUT ETHILON 4-0 FS2 18XMFL BLK (SUTURE)
SUTURE EHLN 3-0 FS-10 30 BLK (SUTURE) ×1 IMPLANT
SUTURE ETHLN 4-0 FS2 18XMF BLK (SUTURE) IMPLANT
SWAB DUAL CULTURE TRANS RED ST (MISCELLANEOUS) ×1 IMPLANT
SYR 10ML LL (SYRINGE) ×3 IMPLANT

## 2019-08-08 NOTE — Anesthesia Preprocedure Evaluation (Signed)
Anesthesia Evaluation  Patient identified by MRN, date of birth, ID band Patient awake    Reviewed: Allergy & Precautions, NPO status , Patient's Chart, lab work & pertinent test results  History of Anesthesia Complications Negative for: history of anesthetic complications  Airway Mallampati: III  TM Distance: >3 FB Neck ROM: Full    Dental  (+) Edentulous Upper, Edentulous Lower   Pulmonary asthma , Patient abstained from smoking., former smoker,    breath sounds clear to auscultation- rhonchi (-) wheezing      Cardiovascular hypertension, + CAD, + Past MI, + Cardiac Stents, + Peripheral Vascular Disease and +CHF   Rhythm:Regular Rate:Normal - Systolic murmurs and - Diastolic murmurs Echo 6/72/09: - Left ventricle: The cavity size was mildly dilated. Systolic   function was moderately reduced. The estimated ejection fraction   was in the range of 35% to 40%. Diffuse hypokinesis. - Aortic valve: There was trivial regurgitation. - Mitral valve: There was mild regurgitation. - Left atrium: The atrium was mildly dilated.   Neuro/Psych neg Seizures PSYCHIATRIC DISORDERS Depression negative neurological ROS     GI/Hepatic Neg liver ROS, GERD  ,  Endo/Other  diabetes, Insulin Dependent  Renal/GU ESRF and DialysisRenal disease (peritoneal dialysis)     Musculoskeletal   Abdominal (+) + obese,   Peds  Hematology negative hematology ROS (+)   Anesthesia Other Findings Past Medical History: 09/17/2018: Acute respiratory failure with hypoxia (HCC) No date: Arthritis     Comment:  "left arm; right leg" (12/13/2014) No date: Asthma No date: CHF (congestive heart failure) (HCC) No date: Chronic disease anemia     Comment:  [name] 12/13/2014 No date: Chronic kidney disease (CKD), stage IV (severe) ([place])     Comment:  [name] 12/13/2014... on dialysis No date: Complication of anesthesia     Comment:  unable to urinate after CAPD  urgery No date: Continuous ambulatory peritoneal dialysis status (HCC) No date: Coronary artery disease     Comment:  /notes 12/13/2014 No date: Depression No date: Dysrhythmia     Comment:  patient unaware of irregular heartbeat No date: GERD (gastroesophageal reflux disease) No date: High cholesterol     Comment:  [name] 12/13/2014 No date: Hypertension No date: Non-Q wave myocardial infarction [facility])     Comment:  [name] 12/13/2014 No date: PVD (peripheral vascular disease) ([place])     Comment:  [name] 12/13/2014 No date: Type II diabetes mellitus (HCC)   Reproductive/Obstetrics                             Anesthesia Physical Anesthesia Plan  ASA: IV  Anesthesia Plan: General   Post-op Pain Management:    Induction: Intravenous  PONV Risk Score and Plan: 1 and Ondansetron  Airway Management Planned: LMA  Additional Equipment:   Intra-op Plan:   Post-operative Plan:   Informed Consent: I have reviewed the patients History and Physical, chart, labs and discussed the procedure including the risks, benefits and alternatives for the proposed anesthesia with the patient or authorized representative who has indicated his/her understanding and acceptance.     Dental advisory given  Plan Discussed with: CRNA and Anesthesiologist  Anesthesia Plan Comments:         Anesthesia Quick Evaluation

## 2019-08-08 NOTE — Anesthesia Postprocedure Evaluation (Signed)
Anesthesia Post Note  Patient: Scott [name]  Procedure(s) Performed: AMPUTATION RAY GREAT TOE (Right )  Patient location during evaluation: PACU Anesthesia Type: General Level of consciousness: awake and alert Pain management: pain level controlled Vital Signs Assessment: post-procedure vital signs reviewed and stable Respiratory status: spontaneous breathing and respiratory function stable Cardiovascular status: stable Anesthetic complications: no     Last Vitals:  Vitals:   08/08/19 1413 08/08/19 1424  BP:  105/78  Pulse: 73 73  Resp: (!) 23 18  Temp:    SpO2: 100% 93%    Last Pain:  Vitals:   08/08/19 1424  TempSrc:   PainSc: 0-No pain                 KEPHART,WILLIAM K

## 2019-08-08 NOTE — Progress Notes (Signed)
[facility] Progress Note  Patient: Scott [name] WFU:[id number]   PCP: Glen White DOB: 08/27/1961   DOA: 07/31/2019   DOS: 08/08/2019   Date of Service: the patient was seen and examined on 08/08/2019  Chief Complaint  Patient presents with  . Leg Pain   Brief hospital course: Brondon Wann Herseyis an 58 y.o.malewith medical history significant ofperipheral arterial disease, diabetes mellitus type 2, ESRD on peritoneal dialysis presents to the hospital with complaints of right lower extremity pain. Patient was admitted here last week for left lower extremity pain diagnosed with osteomyelitis requiring angiogram by vascular followed by amputation of left second toe by podiatry. He was eventually discharged on IV antibiotics. He had been feeling better but started experiencing right lower extremity pain over the last couple of days. Initially pain was with exertion but now has progressed to persistent and his toes have darkened. Underwent angioplasty of right profunda femoris artery on 08/01/2019. Angioplasty of right superficial femoral and popliteal arteries as well as right peroneal artery on 08/04/2019. Nephrology was consulted to continue peritoneal dialysis here in the hospital. Podiatry has evaluated the patient and currently recommending conservative measures but reevaluation on Monday. ID recommends continuing vancomycin plus cefepime plus Bactrim until 08/18/2019.  Currently further plan is continue IV antibiotics and monitor patient's progress. treat hypotension and monitor recovery after the amputation  Subjective: Seen before and after the surgery.  Initially patient had uncontrolled pain.  Patient received pain medication.  Becomes drowsy.  Later on postprocedure patient does not have any acute complaint.  Continues to have some leg pain.  No chest pain.  No nausea no vomiting no shortness of breath. Patient was transferred to stepdown unit due to  persistent hypotension postprocedure.  Assessment and Plan: Scheduled Meds: . aspirin EC  81 mg Oral Daily  . atorvastatin  40 mg Oral Daily  . calcitRIOL  0.25 mcg Oral Daily  . calcium acetate  2,001 mg Oral TID WC  . Chlorhexidine Gluconate Cloth  6 each Topical Daily  . clopidogrel  75 mg Oral Q breakfast  . darbepoetin (ARANESP) injection - NON-DIALYSIS  40 mcg Subcutaneous Q Sat-1800  . gentamicin cream  1 application Topical Daily  . heparin injection (subcutaneous)  5,000 Units Subcutaneous Q8H  . insulin aspart  0-5 Units Subcutaneous QHS  . insulin aspart  0-9 Units Subcutaneous TID WC  . insulin aspart  2 Units Subcutaneous TID WC  . lactulose  20 g Oral Daily  . pantoprazole  40 mg Oral Daily  . polyethylene glycol  17 g Oral BID  . senna-docusate  2 tablet Oral BID  . sodium chloride flush      . sulfamethoxazole-trimethoprim  1 tablet Oral Daily  . vancomycin variable dose per unstable renal function (pharmacist dosing)   Does not apply See admin instructions   Continuous Infusions: . ceFAZolin    . cefTAZidime (FORTAZ)  IV Stopped (08/07/19 1525)  . dialysis solution 1.5% low-MG/low-CA     PRN Meds: acetaminophen **OR** acetaminophen, albuterol, bisacodyl, calcium acetate, HYDROcodone-acetaminophen, methocarbamol, ondansetron **OR** ondansetron (ZOFRAN) IV, oxyCODONE-acetaminophen  Right lower leg gangrene. Severe peripheral vascular disease Recent left toe osteomyelitis. Vascular surgery was consulted. Patient has undergone percutaneous angioplasty of multiple blood vessels in the right leg in a staged fashion on 12/1 and 08/04/2019. Was treated with IV heparin infusion as well. We will continue with antiplatelet therapy for now. Appreciate vascular surgery assistance in managing this patient. Continue increase gabapentin SP amputation  of the right great toe. Per podiatry patient can be discharged home from their perspective. Per ID recommendation is to  continue current antibiotics.  Essential hypertension. Chronic systolic CHF Persistent hypotension Patient is on multiple antihypertensive medication. Blood pressure currently remaining soft. Currently not on any antihypertensive medication. We will continue with Flomax for now. Treated with IV fluids as well as IV albumin.  Monitor in the stepdown unit overnight. Other option would be to add midodrine but would like to hold off on it while the patient is undergoing procedures for amputation and vascular recanalization.  Anemia of chronic disease:  Monitor H&H and transfuse as needed.  ESRD on peritoneal dialysis:  Nephrology consulted. Appreciate their assistance in managing this patient. Continuing PD here at the hospital. If the patient has to go to SNF he will require to be transitioned to HD. Fistula on right extremity appears to be working.  Type 2 diabetes mellitus uncontrolled with hyperglycemia associated with vascular complication Continue with sliding scale insulin. Hemoglobin A1c 6.0 on 07/17/2019.  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  Continue aspirin and Lipitor  Recent osteomyelitis of left toe: Continue antibiotics (ceftazidime, vancomycin and Bactrim) Last day 08/18/2019.  Abdominal pain Severe constipation  Continue with the bowel regimen and add lactulose  Obesity  Body mass index is 30.41 kg/m.  Dietary consult  Diet: Regular diet  DVT Prophylaxis: Subcutaneous Heparin    Advance goals of care discussion: Full code  Family Communication: no family was present at bedside, at the time of interview.  Disposition:  Discharge to be determined.  Consultants: Vascular surgery, podiatry, nephrology, infectious disease Procedures: Angiography and angioplasty of the right leg vessels  Antibiotics: Anti-infectives (From admission, onward)   Start     Dose/Rate Route  Frequency Ordered Stop   08/08/19 1225  ceFAZolin (ANCEF) 2-4 GM/100ML-% IVPB    Note to Pharmacy: Lyman Bishop   : cabinet override      08/08/19 1225 08/09/19 0029   08/05/19 1000  vancomycin (VANCOCIN) IVPB 1000 mg/200 mL premix     1,000 mg 200 mL/hr over 60 Minutes Intravenous  Once 08/04/19 1144 08/05/19 1506   08/04/19 0950  clindamycin (CLEOCIN) 300 MG/50ML IVPB    Note to Pharmacy: Maynor, Erin   : cabinet override      08/04/19 0950 08/04/19 2159   08/04/19 0730  clindamycin (CLEOCIN) IVPB 300 mg    Note to Pharmacy: To be given in specials   300 mg 100 mL/hr over 30 Minutes Intravenous  Once 08/04/19 0715 08/04/19 1057   08/01/19 1245  ceFAZolin (ANCEF) IVPB 1 g/50 mL premix    Note to Pharmacy: To be given in specials   1 g 100 mL/hr over 30 Minutes Intravenous  Once 08/01/19 1234 08/01/19 1426   07/31/19 1600  cefTAZidime (FORTAZ) 1 g in sodium chloride 0.9 % 100 mL IVPB     1 g 200 mL/hr over 30 Minutes Intravenous Every 24 hours 07/31/19 1520     07/31/19 1530  vancomycin variable dose per unstable renal function (pharmacist dosing)      Does not apply See admin instructions 07/31/19 1530     07/31/19 1215  sulfamethoxazole-trimethoprim (BACTRIM) 400-80 MG per tablet 1 tablet     1 tablet Oral Daily 07/31/19 1021     07/31/19 1030  cefTAZidime (FORTAZ) IVPB  Status:  Discontinued    Note to Pharmacy: Indication:  cellulitis  Last Day of Therapy:  08/07/19 Please Administer this Antibiotic Intraperitoneally     1 g Intravenous Every 24 hours 07/31/19 1021 07/31/19 1514   07/31/19 1030  vancomycin IVPB  Status:  Discontinued    Note to Pharmacy: Indication:  cellulits Last Day of Therapy:  08/04/19 Please Administer Antibiotic Intraperitoneally     2,000 mg Intravenous 2 times weekly 07/31/19 1021 07/31/19 1529       Objective: Physical Exam: Vitals:   08/08/19 1709 08/08/19 1712 08/08/19 1722 08/08/19 1748  BP: (!) 93/44  (!) 125/37 (!) 97/53  Pulse: 75 80 74  74  Resp: 15 14 13    Temp: 98.5 F (36.9 C)   98.2 F (36.8 C)  TempSrc:    Oral  SpO2: 91% 94% 91% 100%  Weight:      Height:        Intake/Output Summary (Last 24 hours) at 08/08/2019 1933 Last data filed at 08/08/2019 1751 Gross per 24 hour  Intake 793.95 ml  Output 305 ml  Net 488.95 ml   Filed Weights   08/07/19 0511 08/08/19 0618 08/08/19 1214  Weight: 93.5 kg 93.4 kg 93.4 kg   General: alert and oriented to time, place, and person. Appear in mild distress, affect appropriate Eyes: PERRL, Conjunctiva normal ENT: Oral Mucosa Clear, moist  Neck: no JVD, no Abnormal Mass Or lumps Cardiovascular: S1 and S2 Present, no Murmur,  Respiratory: good respiratory effort, Bilateral Air entry equal and Decreased, no signs of accessory muscle use, Clear to Auscultation, no Crackles, no wheezes Abdomen: Bowel Sound present, Soft and mild diffuse tenderness, no hernia Skin: no rashes, right leg redness and warmth Extremities: trace Pedal edema, no calf tenderness Neurologic: without any new focal findings gait not checked due to patient safety concerns  Data Reviewed: I have personally reviewed and interpreted daily labs, tele strips, imagings as discussed above. I reviewed all nursing notes, pharmacy notes, vitals, pertinent old records I have discussed plan of care as described above with RN and patient/family.  CBC: Recent Labs  Lab 08/02/19 0620  08/05/19 1914 08/06/19 0535 08/07/19 0533 08/07/19 1855 08/08/19 0417  WBC 9.7   < > 12.0* 12.1* 13.6* 12.1* 12.0*  NEUTROABS 7.4  --   --   --   --   --   --   HGB 7.7*   < > 7.4* 7.8* 7.7* 7.2* 7.3*  HCT 24.6*   < > 23.2* 25.3* 24.6* 23.5* 24.2*  MCV 98.0   < > 95.1 96.2 96.5 100.0 100.0  PLT 244   < > 215 227 208 209 225   < > = values in this interval not displayed.   Basic Metabolic Panel: Recent Labs  Lab 08/03/19 0559  08/05/19 0648 08/05/19 1914 08/06/19 0535 08/07/19 0533 08/08/19 0417  NA 139   < > 138 135  140 139 136  K 4.4   < > 4.5 4.8 4.6 4.6 4.5  CL 102   < > 102 101 101 102 100  CO2 25   < > 24 24 27 27 25   GLUCOSE 121*   < > 107* 105* 96 126* 103*  BUN 49*   < > 46* 53* 49* 55* 51*  CREATININE 7.32*   < > 7.80* 8.57* 7.96* 8.06* 8.17*  CALCIUM 8.3*   < > 8.0* 7.8* 8.5* 8.3* 8.1*  MG 2.5*  --   --   --   --   --  2.3  PHOS  --   --   --   --   --   --  5.1*   < > = values in this interval not displayed.    Liver Function Tests: Recent Labs  Lab 08/05/19 1914 08/08/19 0417  AST 20  --   ALT 10  --   ALKPHOS 92  --   BILITOT 0.6  --   PROT 5.4*  --   ALBUMIN 2.1* 2.2*   Recent Labs  Lab 08/05/19 1914  LIPASE 22   No results for input(s): AMMONIA in the last 168 hours. Coagulation Profile: No results for input(s): INR, PROTIME in the last 168 hours. Cardiac Enzymes: No results for input(s): CKTOTAL, CKMB, CKMBINDEX, TROPONINI in the last 168 hours. BNP (last 3 results) No results for input(s): PROBNP in the last 8760 hours. CBG: Recent Labs  Lab 08/07/19 2138 08/08/19 0832 08/08/19 1125 08/08/19 1226 08/08/19 1402  GLUCAP 78 114* 93 92 125*   Studies: No results found.   Time spent: 35 minutes  Author: Berle Mull, MD [facility] 08/08/2019 7:33 PM  To reach On-call, see care teams to locate the attending and reach out to them via [email or web address]. If 7PM-7AM, please contact night-coverage If you still have difficulty reaching the attending provider, please page the [facility] (Director on Call) for [facility] on amion for assistance.

## 2019-08-08 NOTE — Anesthesia Post-op Follow-up Note (Signed)
Anesthesia QCDR form completed.        

## 2019-08-08 NOTE — Interval H&P Note (Signed)
History and Physical Interval Note:  08/08/2019 12:40 PM  Scott [name]  has presented today for surgery, with the diagnosis of Gangrene RIGHT Great Toe.  The various methods of treatment have been discussed with the patient and family. After consideration of risks, benefits and other options for treatment, the patient has consented to  Procedure(s): AMPUTATION RAY GREAT TOE (Right) as a surgical intervention.  The patient's history has been reviewed, patient examined, no change in status, stable for surgery.  I have reviewed the patient's chart and labs.  Questions were answered to the patient's satisfaction.     Durward Fortes

## 2019-08-08 NOTE — Progress Notes (Addendum)
PT Cancellation Note  Patient Details Name: Scott [name] MRN: [id number] DOB: Dec 31, 1960   Cancelled Treatment:    Reason Eval/Treat Not Completed: Patient at procedure or test/unavailable Pt out of room for R great toe amputation.  Will likely have/need new and more limiting WBing status moving forward.  PT held this date.  Kreg Shropshire, DPT 08/08/2019, 4:29 PM

## 2019-08-08 NOTE — Anesthesia Procedure Notes (Signed)
Procedure Name: LMA Insertion Date/Time: 08/08/2019 12:50 PM Performed by: Allean Found, CRNA Pre-anesthesia Checklist: Patient identified, Patient being monitored, Timeout performed, Emergency Drugs available and Suction available Patient Re-evaluated:Patient Re-evaluated prior to induction Oxygen Delivery Method: Circle system utilized Preoxygenation: Pre-oxygenation with 100% oxygen Induction Type: IV induction Ventilation: Mask ventilation without difficulty LMA: LMA inserted LMA Size: 5.0 Tube type: Oral Number of attempts: 1 Placement Confirmation: positive ETCO2 and breath sounds checked- equal and bilateral Tube secured with: Tape Dental Injury: Teeth and Oropharynx as per pre-operative assessment

## 2019-08-08 NOTE — Progress Notes (Signed)
[facility]  ROUNDING NOTE   Subjective:   Peritoneal dialysis treatment last night. Tolerated treatment well.  Poor appetite Lethargic today but able to talk No nausea or vomiting No SOB   .   Objective:  Vital signs in last 24 hours:  Temp:  [98.2 F (36.8 C)-99.9 F (37.7 C)] 98.3 F (36.8 C) (12/08 1214) Pulse Rate:  [66-90] 66 (12/08 1214) Resp:  [12-22] 17 (12/08 1214) BP: (88-101)/(34-68) 100/45 (12/08 1214) SpO2:  [96 %-100 %] 100 % (12/08 1214) Weight:  [93.4 kg] 93.4 kg (12/08 1214)  Weight change: -0.181 kg Filed Weights   08/07/19 0511 08/08/19 0618 08/08/19 1214  Weight: 93.5 kg 93.4 kg 93.4 kg    Intake/Output: I/O last 3 completed shifts: In: 805.2 [P.O.:480; IV Piggyback:325.2] Out: 1325 [Urine:700; Other:625]   Intake/Output this shift:  Total I/O In: 100 [I.V.:100] Out: -   Physical Exam: General: NAD   Head: Normocephalic, atraumatic. Moist oral mucosal membranes  Eyes: Anicteric,    Lungs:  Clear to auscultation  Heart: Regular rate and rhythm  Abdomen:  Soft, nontender,   Extremities:  Left foot in clean and dry dressings Right toe gangrenous  Neurologic: able to answer few simple questions  Access: PD catheter    Basic Metabolic Panel: Recent Labs  Lab 08/03/19 0559  08/05/19 0648 08/05/19 1914 08/06/19 0535 08/07/19 0533 08/08/19 0417  NA 139   < > 138 135 140 139 136  K 4.4   < > 4.5 4.8 4.6 4.6 4.5  CL 102   < > 102 101 101 102 100  CO2 25   < > 24 24 27 27 25   GLUCOSE 121*   < > 107* 105* 96 126* 103*  BUN 49*   < > 46* 53* 49* 55* 51*  CREATININE 7.32*   < > 7.80* 8.57* 7.96* 8.06* 8.17*  CALCIUM 8.3*   < > 8.0* 7.8* 8.5* 8.3* 8.1*  MG 2.5*  --   --   --   --   --  2.3  PHOS  --   --   --   --   --   --  5.1*   < > = values in this interval not displayed.    Liver Function Tests: Recent Labs  Lab 08/05/19 1914 08/08/19 0417  AST 20  --   ALT 10  --   ALKPHOS 92  --   BILITOT 0.6  --   PROT  5.4*  --   ALBUMIN 2.1* 2.2*   Recent Labs  Lab 08/05/19 1914  LIPASE 22   No results for input(s): AMMONIA in the last 168 hours.  CBC: Recent Labs  Lab 08/02/19 0620  08/05/19 1914 08/06/19 0535 08/07/19 0533 08/07/19 1855 08/08/19 0417  WBC 9.7   < > 12.0* 12.1* 13.6* 12.1* 12.0*  NEUTROABS 7.4  --   --   --   --   --   --   HGB 7.7*   < > 7.4* 7.8* 7.7* 7.2* 7.3*  HCT 24.6*   < > 23.2* 25.3* 24.6* 23.5* 24.2*  MCV 98.0   < > 95.1 96.2 96.5 100.0 100.0  PLT 244   < > 215 227 208 209 225   < > = values in this interval not displayed.    Cardiac Enzymes: No results for input(s): CKTOTAL, CKMB, CKMBINDEX, TROPONINI in the last 168 hours.  BNP: Invalid input(s): POCBNP  CBG: Recent Labs  Lab 08/07/19 1716 08/07/19  2138 08/08/19 0832 08/08/19 1125 08/08/19 1226  GLUCAP 143* 78 114* 93 92    Microbiology: Results for orders placed or performed during the hospital encounter of 07/31/19  CULTURE, BLOOD (ROUTINE X 2) w Reflex to ID Panel     Status: None (Preliminary result)   Collection Time: 08/06/19 10:37 AM   Specimen: BLOOD  Result Value Ref Range Status   Specimen Description BLOOD LAC  Final   Special Requests   Final    BOTTLES DRAWN AEROBIC AND ANAEROBIC Blood Culture adequate volume   Culture   Final    NO GROWTH 2 DAYS Performed at [facility], [address] [place], [place] [address]    Report Status PENDING  Incomplete  CULTURE, BLOOD (ROUTINE X 2) w Reflex to ID Panel     Status: None (Preliminary result)   Collection Time: 08/06/19 10:37 AM   Specimen: BLOOD  Result Value Ref Range Status   Specimen Description BLOOD LEFT FOREARM  Final   Special Requests   Final    BOTTLES DRAWN AEROBIC AND ANAEROBIC Blood Culture results may not be optimal due to an inadequate volume of blood received in culture bottles   Culture   Final    NO GROWTH 2 DAYS Performed at [facility], [address], [place], [place] [address]     Report Status PENDING  Incomplete  Body fluid culture     Status: None (Preliminary result)   Collection Time: 08/07/19  9:00 AM   Specimen: Peritoneal Washings; Peritoneal Fluid  Result Value Ref Range Status   Specimen Description   Final    PERITONEAL Performed at [facility] [place], [address] [place], [place] [address]    Special Requests   Final    Normal Performed at [facility], [place], [place] [address]    Gram Stain   Final    RARE WBC PRESENT,BOTH PMN AND MONONUCLEAR NO ORGANISMS SEEN    Culture   Final    NO GROWTH < 24 HOURS Performed at [facility], [place] [address], [place],  [address]    Report Status PENDING  Incomplete  Respiratory Panel by RT PCR (Flu A&B, Covid) - Nasopharyngeal Swab     Status: None   Collection Time: 08/08/19 10:53 AM   Specimen: Nasopharyngeal Swab  Result Value Ref Range Status   SARS Coronavirus 2 by RT PCR NEGATIVE NEGATIVE Final    Comment: (NOTE) SARS-CoV-2 target nucleic acids are NOT DETECTED. The SARS-CoV-2 RNA is generally detectable in upper respiratoy specimens during the acute phase of infection. The lowest concentration of SARS-CoV-2 viral copies this assay can detect is 131 copies/mL. A negative result does not preclude SARS-Cov-2 infection and should not be used as the sole basis for treatment or other patient management decisions. A negative result may occur with  improper specimen collection/handling, submission of specimen other than nasopharyngeal swab, presence of viral mutation(s) within the areas targeted by this assay, and inadequate number of viral copies (<131 copies/mL). A negative result must be combined with clinical observations, patient history, and epidemiological information. The expected result is Negative. Fact Sheet for Patients:  PinkCheek.be Fact Sheet for Healthcare Providers:   GravelBags.it This test is not yet ap proved or cleared by the [place] FDA and  has been authorized for detection and/or diagnosis of SARS-CoV-2 by FDA under an Emergency Use Authorization (EUA). This EUA will remain  in effect (meaning this test can  be used) for the duration of the COVID-19 declaration under Section 564(b)(1) of the Act, 21 U.S.C. section 360bbb-3(b)(1), unless the authorization is terminated or revoked sooner.    Influenza A by PCR NEGATIVE NEGATIVE Final   Influenza B by PCR NEGATIVE NEGATIVE Final    Comment: (NOTE) The Xpert Xpress SARS-CoV-2/FLU/RSV assay is intended as an aid in  the diagnosis of influenza from Nasopharyngeal swab specimens and  should not be used as a sole basis for treatment. Nasal washings and  aspirates are unacceptable for Xpert Xpress SARS-CoV-2/FLU/RSV  testing. Fact Sheet for Patients: PinkCheek.be Fact Sheet for Healthcare Providers: GravelBags.it This test is not yet approved or cleared by the [place] FDA and  has been authorized for detection and/or diagnosis of SARS-CoV-2 by  FDA under an Emergency Use Authorization (EUA). This EUA will remain  in effect (meaning this test can be used) for the duration of the  Covid-19 declaration under Section 564(b)(1) of the Act, 21  U.S.C. section 360bbb-3(b)(1), unless the authorization is  terminated or revoked. Performed at [facility], [address] [place], [place] [address]     Coagulation Studies: No results for input(s): LABPROT, INR in the last 72 hours.  Urinalysis: No results for input(s): COLORURINE, LABSPEC, PHURINE, GLUCOSEU, HGBUR, BILIRUBINUR, KETONESUR, PROTEINUR, UROBILINOGEN, NITRITE, LEUKOCYTESUR in the last 72 hours.  Invalid input(s): APPERANCEUR    Imaging: No results found.   Medications:   . ceFAZolin    . [MAR Hold] cefTAZidime (FORTAZ)  IV  Stopped (08/07/19 1525)  . [MAR Hold] dialysis solution 1.5% low-MG/low-CA     . [MAR Hold] aspirin EC  81 mg Oral Daily  . [MAR Hold] atorvastatin  40 mg Oral Daily  . [MAR Hold] calcitRIOL  0.25 mcg Oral Daily  . [MAR Hold] calcium acetate  2,001 mg Oral TID WC  . [MAR Hold] clopidogrel  75 mg Oral Q breakfast  . [MAR Hold] darbepoetin (ARANESP) injection - NON-DIALYSIS  40 mcg Subcutaneous Q Sat-1800  . [MAR Hold] gentamicin cream  1 application Topical Daily  . [MAR Hold] heparin injection (subcutaneous)  5,000 Units Subcutaneous Q8H  . [MAR Hold] insulin aspart  0-5 Units Subcutaneous QHS  . [MAR Hold] insulin aspart  0-9 Units Subcutaneous TID WC  . [MAR Hold] insulin aspart  2 Units Subcutaneous TID WC  . [MAR Hold] lactulose  20 g Oral Daily  . [MAR Hold] pantoprazole  40 mg Oral Daily  . [MAR Hold] polyethylene glycol  17 g Oral BID  . [MAR Hold] senna-docusate  2 tablet Oral BID  . [MAR Hold] sulfamethoxazole-trimethoprim  1 tablet Oral Daily  . [MAR Hold] vancomycin variable dose per unstable renal function (pharmacist dosing)   Does not apply See admin instructions   [MAR Hold] acetaminophen **OR** [MAR Hold] acetaminophen, [MAR Hold] albuterol, [MAR Hold] bisacodyl, [MAR Hold] calcium acetate, [MAR Hold] HYDROcodone-acetaminophen, [MAR Hold]  HYDROmorphone (DILAUDID) injection, [MAR Hold] methocarbamol, [MAR Hold] ondansetron **OR** [MAR Hold] ondansetron (ZOFRAN) IV  Assessment/ Plan:  Scott [name] is a 58 y.o. white male with end stage renal disease on peritoneal dialysis, hypertension, diabetes mellitus type II, peripheral vascular disease, left toe amputation, who is admitted to [facility] on 07/31/2019 for Right leg pain [M79.604] Dry gangrene ([place]) [I96] Ischemic leg pain [M79.606, I99.8]  [facility] PD CAPD 4 exchanges 2061mL fills  1. End Stage Renal Disease: continue peritoneal dialysis. CCPD 10 hours 4 exchanges 2 liter fills - 1.5%  dextrose  2. Peripheral vascular disease: left lower extremity healing well. However now with erythema and ischemic changes to his right first toe.  Discharged on IP ceftazidime and vancomycin. He was administering this at home.  Vascular intervention on 12/1 and 12/4 by Dr. [name] - TMP/SMX, vanco, ceftazidime.  -  Possible amputation of toe today  3.  Anemia of chronic kidney disease:   - Aranesp 56mcg administered on 12/5 - pharmacy consulted  4. Secondary Hyperparathyroidism with hypocalcemia.   - calcitriol - calcium acetate with meals.  Lab Results  Component Value Date   PTH 127 (H) 07/21/2019   CALCIUM 8.1 (L) 08/08/2019   PHOS 5.1 (H) 08/08/2019      LOS: 8 Scott [name] 12/8/20201:40 PM

## 2019-08-08 NOTE — Op Note (Signed)
Date of operation: 08/08/2019.  Surgeon: Durward Fortes D.P.M.  Preoperative diagnosis: Severe PVD with gangrene right hallux.  Postoperative diagnosis: Same.  Operative procedure: Amputation right great toe with partial first ray resection.  Anesthesia: LMA.  Hemostasis: None.  Estimated blood loss: 75 cc.  Pathology: Right first ray.  Complications: None apparent.  Operative indications: This is a 58 year old male with a history of severe PVD with recent intervention for revascularization on the right foot.  Has had stable gangrenous changes in the right great toe over the last few weeks.  Decision was made to proceed with first ray amputation at this time during his hospitalization.  Operative procedure: Patient was taken to the operating room and placed on the table in the supine position.  Following satisfactory LMA anesthesia the right foot was prepped and draped in the usual sterile fashion.  Attention was directed to the distal aspect of the right foot where a racquet type incision was made coursing lateral to medial around the base of the great toe joining along the medial aspect of the first metatarsal with care taken to excise all necrosing tissues.  The incision was carried sharply down to the level of the bone and then dissection carried back proximally around the base of the great toe and joint.  Using a pneumatic saw the distal third of the first metatarsal was transected and the first ray was removed in toto.  There did actually appear to be pretty good healthy bleeding tissues.  Wound was flushed with copious amounts of sterile saline and closed using 3-0 nylon vertical mattress and simple interrupted sutures.  Xeroform 4 x 4's ABDs and Kerlix applied to the right lower extremity followed by a second Kerlix and Ace wrap.  Patient was awakened and transported to the PACU having tolerated the anesthesia and procedures well.

## 2019-08-08 NOTE — Transfer of Care (Signed)
Immediate Anesthesia Transfer of Care Note  Patient: Scott [name]  Procedure(s) Performed: AMPUTATION RAY GREAT TOE (Right )  Patient Location: PACU  Anesthesia Type:General  Level of Consciousness: awake, alert  and oriented  Airway & Oxygen Therapy: Patient Spontanous Breathing and Patient connected to face mask oxygen  Post-op Assessment: Report given to RN and Post -op Vital signs reviewed and stable  Post vital signs: Reviewed and stable  Last Vitals:  Vitals Value Taken Time  BP 109/40 08/08/19 1354  Temp 36.2 C 08/08/19 1354  Pulse 73 08/08/19 1357  Resp 18 08/08/19 1357  SpO2 99 % 08/08/19 1357  Vitals shown include unvalidated device data.  Last Pain:  Vitals:   08/08/19 1354  TempSrc:   PainSc: (P) Asleep      Patients Stated Pain Goal: 5 (34/91/79 1505)  Complications: No apparent anesthesia complications

## 2019-08-09 ENCOUNTER — Encounter: Payer: Self-pay | Admitting: Podiatry

## 2019-08-09 DIAGNOSIS — I998 Other disorder of circulatory system: Secondary | ICD-10-CM

## 2019-08-09 DIAGNOSIS — M79606 Pain in leg, unspecified: Secondary | ICD-10-CM

## 2019-08-09 DIAGNOSIS — Z9889 Other specified postprocedural states: Secondary | ICD-10-CM

## 2019-08-09 LAB — RENAL FUNCTION PANEL
Albumin: 2.5 g/dL — ABNORMAL LOW (ref 3.5–5.0)
Anion gap: 12 (ref 5–15)
BUN: 52 mg/dL — ABNORMAL HIGH (ref 6–20)
CO2: 25 mmol/L (ref 22–32)
Calcium: 7.7 mg/dL — ABNORMAL LOW (ref 8.9–10.3)
Chloride: 98 mmol/L (ref 98–111)
Creatinine, Ser: 8.04 mg/dL — ABNORMAL HIGH (ref 0.61–1.24)
GFR calc Af Amer: 8 mL/min — ABNORMAL LOW (ref >60)
GFR calc non Af Amer: 7 mL/min — ABNORMAL LOW (ref >60)
Glucose, Bld: 128 mg/dL — ABNORMAL HIGH (ref 70–99)
Phosphorus: 5.4 mg/dL — ABNORMAL HIGH (ref 2.5–4.6)
Potassium: 4.6 mmol/L (ref 3.5–5.1)
Sodium: 135 mmol/L (ref 135–145)

## 2019-08-09 LAB — CBC
HCT: 22.4 % — ABNORMAL LOW (ref 39.0–52.0)
Hemoglobin: 7.1 g/dL — ABNORMAL LOW (ref 13.0–17.0)
MCH: 30.9 pg (ref 26.0–34.0)
MCHC: 31.7 g/dL (ref 30.0–36.0)
MCV: 97.4 fL (ref 80.0–100.0)
Platelets: 197 10*3/uL (ref 150–400)
RBC: 2.3 MIL/uL — ABNORMAL LOW (ref 4.22–5.81)
RDW: 16.2 % — ABNORMAL HIGH (ref 11.5–15.5)
WBC: 11 10*3/uL — ABNORMAL HIGH (ref 4.0–10.5)
nRBC: 0 % (ref 0.0–0.2)

## 2019-08-09 LAB — SURGICAL PATHOLOGY

## 2019-08-09 LAB — GLUCOSE, CAPILLARY
Glucose-Capillary: 95 mg/dL (ref 70–99)
Glucose-Capillary: 120 mg/dL — ABNORMAL HIGH (ref 70–99)
Glucose-Capillary: 158 mg/dL — ABNORMAL HIGH (ref 70–99)
Glucose-Capillary: 76 mg/dL (ref 70–99)
Glucose-Capillary: 125 mg/dL — ABNORMAL HIGH (ref 70–99)

## 2019-08-09 LAB — PREPARE RBC (CROSSMATCH)

## 2019-08-09 LAB — VANCOMYCIN, RANDOM: Vancomycin Rm: 16

## 2019-08-09 MED ORDER — VANCOMYCIN HCL IN DEXTROSE 1-5 GM/200ML-% IV SOLN
1000.0000 mg | Freq: Once | INTRAVENOUS | Status: AC
  Administered 2019-08-09: 1000 mg via INTRAVENOUS
  Filled 2019-08-09: qty 200

## 2019-08-09 MED ORDER — MIDODRINE HCL 5 MG PO TABS
10.0000 mg | ORAL_TABLET | Freq: Three times a day (TID) | ORAL | Status: DC
  Administered 2019-08-09 – 2019-08-10 (×5): 10 mg via ORAL
  Filled 2019-08-09 (×6): qty 2

## 2019-08-09 MED ORDER — SODIUM CHLORIDE 0.9% IV SOLUTION
Freq: Once | INTRAVENOUS | Status: AC
  Administered 2019-08-09: 12:00:00 via INTRAVENOUS

## 2019-08-09 NOTE — Progress Notes (Signed)
   08/09/19 1915  Neurological  Level of Consciousness Alert  Orientation Level Oriented X4  Respiratory  Respiratory Pattern Regular;Unlabored  Bilateral Breath Sounds Clear;Diminished  Cardiac  Pulse Regular  Heart Sounds S1, S2  pt stable for PD TX no c/os vitals stable

## 2019-08-09 NOTE — Progress Notes (Signed)
1 Day Post-Op   Subjective/Chief Complaint: Patient seen.  States foot is not really hurting him that bad.  A little bit better than prior to surgery.   Objective: Vital signs in last 24 hours: Temp:  [97.1 F (36.2 C)-98.6 F (37 C)] 97.7 F (36.5 C) (12/09 1235) Pulse Rate:  [50-80] 65 (12/09 1235) Resp:  [11-23] 17 (12/09 1235) BP: (77-125)/(27-78) 91/50 (12/09 1235) SpO2:  [91 %-100 %] 97 % (12/09 1235) Weight:  [93.5 kg] 93.5 kg (12/09 0500) Last BM Date: 08/08/19  Intake/Output from previous day: 12/08 0701 - 12/09 0700 In: 708.8 [P.O.:290; I.V.:225; IV Piggyback:193.8] Out: 5 [Blood:5] Intake/Output this shift: Total I/O In: 200 [IV Piggyback:200] Out: 740 [Other:740]  The bandage on the right foot is dry and intact with no evidence of any strikethrough.  Lab Results:  Recent Labs    08/08/19 0417 08/09/19 0448  WBC 12.0* 11.0*  HGB 7.3* 7.1*  HCT 24.2* 22.4*  PLT 225 197   BMET Recent Labs    08/08/19 0417 08/09/19 0448  NA 136 135  K 4.5 4.6  CL 100 98  CO2 25 25  GLUCOSE 103* 128*  BUN 51* 52*  CREATININE 8.17* 8.04*  CALCIUM 8.1* 7.7*   PT/INR No results for input(s): LABPROT, INR in the last 72 hours. ABG No results for input(s): PHART, HCO3 in the last 72 hours.  Invalid input(s): PCO2, PO2  Studies/Results: No results found.  Anti-infectives: Anti-infectives (From admission, onward)   Start     Dose/Rate Route Frequency Ordered Stop   08/09/19 1000  vancomycin (VANCOCIN) IVPB 1000 mg/200 mL premix     1,000 mg 200 mL/hr over 60 Minutes Intravenous  Once 08/09/19 0807 08/09/19 1039   08/08/19 1225  ceFAZolin (ANCEF) 2-4 GM/100ML-% IVPB    Note to Pharmacy: Lyman Bishop   : cabinet override      08/08/19 1225 08/09/19 0029   08/05/19 1000  vancomycin (VANCOCIN) IVPB 1000 mg/200 mL premix     1,000 mg 200 mL/hr over 60 Minutes Intravenous  Once 08/04/19 1144 08/05/19 1506   08/04/19 0950  clindamycin (CLEOCIN) 300 MG/50ML IVPB     Note to Pharmacy: Maynor, Erin   : cabinet override      08/04/19 0950 08/04/19 2159   08/04/19 0730  clindamycin (CLEOCIN) IVPB 300 mg    Note to Pharmacy: To be given in specials   300 mg 100 mL/hr over 30 Minutes Intravenous  Once 08/04/19 0715 08/04/19 1057   08/01/19 1245  ceFAZolin (ANCEF) IVPB 1 g/50 mL premix    Note to Pharmacy: To be given in specials   1 g 100 mL/hr over 30 Minutes Intravenous  Once 08/01/19 1234 08/01/19 1426   07/31/19 1600  cefTAZidime (FORTAZ) 1 g in sodium chloride 0.9 % 100 mL IVPB     1 g 200 mL/hr over 30 Minutes Intravenous Every 24 hours 07/31/19 1520     07/31/19 1530  vancomycin variable dose per unstable renal function (pharmacist dosing)      Does not apply See admin instructions 07/31/19 1530     07/31/19 1215  sulfamethoxazole-trimethoprim (BACTRIM) 400-80 MG per tablet 1 tablet     1 tablet Oral Daily 07/31/19 1021     07/31/19 1030  cefTAZidime (FORTAZ) IVPB  Status:  Discontinued    Note to Pharmacy: Indication:  cellulitis Last Day of Therapy:  08/07/19 Please Administer this Antibiotic Intraperitoneally     1 g Intravenous Every 24 hours 07/31/19  1021 07/31/19 1514   07/31/19 1030  vancomycin IVPB  Status:  Discontinued    Note to Pharmacy: Indication:  cellulits Last Day of Therapy:  08/04/19 Please Administer Antibiotic Intraperitoneally     2,000 mg Intravenous 2 times weekly 07/31/19 1021 07/31/19 1529      Assessment/Plan: s/p Procedure(s): AMPUTATION RAY GREAT TOE (Right) Assessment: Status post first ray resection right foot.   Plan: Dressing left intact.  Patient should be stable for discharge as far as his foot is concerned.  I will plan on follow-up next Monday for reevaluation of the wound or may reassess the wound by the end of the week if he is still in the hospital.  LOS: 9 days    Scott [name] 08/09/2019

## 2019-08-09 NOTE — Progress Notes (Signed)
[facility] Progress Note  Patient: Scott [name] OQH:[id number]   PCP: [facility], [facility] DOB: 1960/10/08   DOA: 07/31/2019   DOS: 08/09/2019   Date of Service: the patient was seen and examined on 08/09/2019  Chief Complaint  Patient presents with  . Leg Pain   Brief hospital course: Yosef Krogh Herseyis an 58 y.o.malewith medical history significant ofperipheral arterial disease, diabetes mellitus type 2, ESRD on peritoneal dialysis presents to the hospital with complaints of right lower extremity pain. Patient was admitted here last week for left lower extremity pain diagnosed with osteomyelitis requiring angiogram by vascular followed by amputation of left second toe by podiatry. He was eventually discharged on IV antibiotics. He had been feeling better but started experiencing right lower extremity pain over the last couple of days. Initially pain was with exertion but now has progressed to persistent and his toes have darkened. Underwent angioplasty of right profunda femoris artery on 08/01/2019. Angioplasty of right superficial femoral and popliteal arteries as well as right peroneal artery on 08/04/2019. Nephrology was consulted to continue peritoneal dialysis here in the hospital. Podiatry has evaluated the patient and currently recommending conservative measures but reevaluation on Monday. ID recommends continuing vancomycin plus cefepime plus Bactrim until 08/18/2019.  Currently further plan is continue IV antibiotics and monitor patient's progress. treat hypotension and monitor recovery after the amputation  Subjective: reports having pain in R foot due to recent surgery. Denies any shortness of breath or chest pain. He does feel generally weak.  Assessment and Plan: Scheduled Meds: . aspirin EC  81 mg Oral Daily  . atorvastatin  40 mg Oral Daily  . calcitRIOL  0.25 mcg Oral Daily  . calcium acetate  2,001 mg Oral TID WC  . Chlorhexidine Gluconate Cloth   6 each Topical Daily  . clopidogrel  75 mg Oral Q breakfast  . darbepoetin (ARANESP) injection - NON-DIALYSIS  40 mcg Subcutaneous Q Sat-1800  . gentamicin cream  1 application Topical Daily  . heparin injection (subcutaneous)  5,000 Units Subcutaneous Q8H  . insulin aspart  0-5 Units Subcutaneous QHS  . insulin aspart  0-9 Units Subcutaneous TID WC  . insulin aspart  2 Units Subcutaneous TID WC  . lactulose  20 g Oral Daily  . midodrine  10 mg Oral TID WC  . pantoprazole  40 mg Oral Daily  . polyethylene glycol  17 g Oral BID  . senna-docusate  2 tablet Oral BID  . sulfamethoxazole-trimethoprim  1 tablet Oral Daily  . vancomycin variable dose per unstable renal function (pharmacist dosing)   Does not apply See admin instructions   Continuous Infusions: . cefTAZidime (FORTAZ)  IV 200 mL/hr at 08/09/19 1545  . dialysis solution 1.5% low-MG/low-CA     PRN Meds: acetaminophen **OR** acetaminophen, albuterol, bisacodyl, calcium acetate, HYDROcodone-acetaminophen, methocarbamol, ondansetron **OR** ondansetron (ZOFRAN) IV, oxyCODONE-acetaminophen  Right lower leg gangrene. Severe peripheral vascular disease Recent left toe osteomyelitis. Vascular surgery was consulted. Patient has undergone percutaneous angioplasty of multiple blood vessels in the right leg in a staged fashion on 12/1 and 08/04/2019. Was treated with IV heparin infusion as well. We will continue with antiplatelet therapy for now. Appreciate vascular surgery assistance in managing this patient. Continue increase gabapentin SP amputation of the right great toe. Per podiatry patient can be discharged home from their perspective. Per ID recommendation is to continue current antibiotics.  Essential hypertension. Chronic systolic CHF Persistent hypotension Patient is on multiple antihypertensive medication. Blood pressure currently remaining soft.  Currently not on any antihypertensive medication. We will continue with  Flomax for now. Treated with IV fluids as well as IV albumin.  Monitor in the stepdown unit overnight. He was also started on midodrine overnight due to persistent hypotension..  Anemia of chronic disease:  Hemoglobin is trended down postoperatively to 7.1.  He is feeling generally weak and has been hypotensive most of the night.  Will transfuse 1 unit of PRBC.  ESRD on peritoneal dialysis:  Nephrology consulted. Appreciate their assistance in managing this patient. Continuing PD here at the hospital. If the patient has to go to SNF he will require to be transitioned to HD. Fistula on right extremity appears to be working.  Type 2 diabetes mellitus uncontrolled with hyperglycemia associated with vascular complication Continue with sliding scale insulin. Hemoglobin A1c 6.0 on 07/17/2019.  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  Continue aspirin and Lipitor  Recent osteomyelitis of left toe: Continue antibiotics (ceftazidime, vancomycin and Bactrim) Last day 08/18/2019.  Abdominal pain Severe constipation  Continue with the bowel regimen and add lactulose  Obesity  Body mass index is 30.44 kg/m.  Dietary consult  Diet: Regular diet  DVT Prophylaxis: Subcutaneous Heparin    Advance goals of care discussion: Full code  Family Communication: no family was present at bedside, at the time of interview.  Disposition:  Discharge to be determined.  Consultants: Vascular surgery, podiatry, nephrology, infectious disease Procedures: Angiography and angioplasty of the right leg vessels  Antibiotics: Anti-infectives (From admission, onward)   Start     Dose/Rate Route Frequency Ordered Stop   08/09/19 1000  vancomycin (VANCOCIN) IVPB 1000 mg/200 mL premix     1,000 mg 200 mL/hr over 60 Minutes Intravenous  Once 08/09/19 0807 08/09/19 1039   08/08/19 1225  ceFAZolin (ANCEF) 2-4 GM/100ML-% IVPB     Note to Pharmacy: Lyman Bishop   : cabinet override      08/08/19 1225 08/09/19 0029   08/05/19 1000  vancomycin (VANCOCIN) IVPB 1000 mg/200 mL premix     1,000 mg 200 mL/hr over 60 Minutes Intravenous  Once 08/04/19 1144 08/05/19 1506   08/04/19 0950  clindamycin (CLEOCIN) 300 MG/50ML IVPB    Note to Pharmacy: Maynor, Erin   : cabinet override      08/04/19 0950 08/04/19 2159   08/04/19 0730  clindamycin (CLEOCIN) IVPB 300 mg    Note to Pharmacy: To be given in specials   300 mg 100 mL/hr over 30 Minutes Intravenous  Once 08/04/19 0715 08/04/19 1057   08/01/19 1245  ceFAZolin (ANCEF) IVPB 1 g/50 mL premix    Note to Pharmacy: To be given in specials   1 g 100 mL/hr over 30 Minutes Intravenous  Once 08/01/19 1234 08/01/19 1426   07/31/19 1600  cefTAZidime (FORTAZ) 1 g in sodium chloride 0.9 % 100 mL IVPB     1 g 200 mL/hr over 30 Minutes Intravenous Every 24 hours 07/31/19 1520     07/31/19 1530  vancomycin variable dose per unstable renal function (pharmacist dosing)      Does not apply See admin instructions 07/31/19 1530     07/31/19 1215  sulfamethoxazole-trimethoprim (BACTRIM) 400-80 MG per tablet 1 tablet     1 tablet Oral Daily 07/31/19 1021     07/31/19 1030  cefTAZidime (FORTAZ) IVPB  Status:  Discontinued    Note to Pharmacy: Indication:  cellulitis Last Day of Therapy:  08/07/19 Please Administer this Antibiotic Intraperitoneally     1 g Intravenous Every 24 hours 07/31/19 1021 07/31/19 1514   07/31/19 1030  vancomycin IVPB  Status:  Discontinued    Note to Pharmacy: Indication:  cellulits Last Day of Therapy:  08/04/19 Please Administer Antibiotic Intraperitoneally     2,000 mg Intravenous 2 times weekly 07/31/19 1021 07/31/19 1529       Objective: Physical Exam: Vitals:   08/09/19 1525 08/09/19 1600 08/09/19 1700 08/09/19 1800  BP: (!) 100/38  (!) 101/30 (!) 100/46  Pulse: 64 62 69 73  Resp: (!) 26 18 12 11   Temp: 98 F (36.7 C)     TempSrc: Oral      SpO2: 99% 94% 100% 100%  Weight:      Height:        Intake/Output Summary (Last 24 hours) at 08/09/2019 1854 Last data filed at 08/09/2019 1545 Gross per 24 hour  Intake 946.74 ml  Output 740 ml  Net 206.74 ml   Filed Weights   08/08/19 0618 08/08/19 1214 08/09/19 0500  Weight: 93.4 kg 93.4 kg 93.5 kg   General exam: Alert, awake, oriented x 3 Respiratory system: Clear to auscultation. Respiratory effort normal. Cardiovascular system:RRR. No murmurs, rubs, gallops. Gastrointestinal system: Abdomen is nondistended, soft and nontender. No organomegaly or masses felt. Normal bowel sounds heard. Central nervous system: Alert and oriented. No focal neurological deficits. Extremities: Bilateral feet are wrapped in dressings and Ace bandage Skin: No rashes, lesions or ulcers Psychiatry: Judgement and insight appear normal. Mood & affect appropriate.   Data Reviewed: I have personally reviewed and interpreted daily labs, tele strips, imagings as discussed above. I reviewed all nursing notes, pharmacy notes, vitals, pertinent old records I have discussed plan of care as described above with RN and patient/family.  CBC: Recent Labs  Lab 08/06/19 0535 08/07/19 0533 08/07/19 1855 08/08/19 0417 08/09/19 0448  WBC 12.1* 13.6* 12.1* 12.0* 11.0*  HGB 7.8* 7.7* 7.2* 7.3* 7.1*  HCT 25.3* 24.6* 23.5* 24.2* 22.4*  MCV 96.2 96.5 100.0 100.0 97.4  PLT 227 208 209 225 672   Basic Metabolic Panel: Recent Labs  Lab 08/03/19 0559  08/05/19 1914 08/06/19 0535 08/07/19 0533 08/08/19 0417 08/09/19 0448  NA 139   < > 135 140 139 136 135  K 4.4   < > 4.8 4.6 4.6 4.5 4.6  CL 102   < > 101 101 102 100 98  CO2 25   < > 24 27 27 25 25   GLUCOSE 121*   < > 105* 96 126* 103* 128*  BUN 49*   < > 53* 49* 55* 51* 52*  CREATININE 7.32*   < > 8.57* 7.96* 8.06* 8.17* 8.04*  CALCIUM 8.3*   < > 7.8* 8.5* 8.3* 8.1* 7.7*  MG 2.5*  --   --   --   --  2.3  --   PHOS  --   --   --   --   --  5.1* 5.4*    < > = values in this interval not displayed.    Liver Function Tests: Recent Labs  Lab 08/05/19 1914 08/08/19 0417 08/09/19 0448  AST 20  --   --   ALT 10  --   --   ALKPHOS 92  --   --   BILITOT 0.6  --   --   PROT 5.4*  --   --   ALBUMIN 2.1* 2.2* 2.5*   Recent Labs  Lab 08/05/19 1914  LIPASE 22   No results for input(s): AMMONIA in the last 168 hours. Coagulation Profile: No results for input(s): INR, PROTIME in the last 168 hours. Cardiac Enzymes: No results for input(s): CKTOTAL, CKMB, CKMBINDEX, TROPONINI in the last 168 hours. BNP (last 3 results) No results for input(s): PROBNP in the last 8760 hours. CBG: Recent Labs  Lab 08/08/19 1755 08/08/19 2155 08/09/19 0720 08/09/19 1127 08/09/19 1703  GLUCAP 95 120* 120* 158* 76   Studies: No results found.   Time spent: 35 minutes  Author: Kathie Dike, MD [facility] 08/09/2019 6:54 PM  To reach On-call, see care teams to locate the attending and reach out to them via [email or web address]. If 7PM-7AM, please contact night-coverage If you still have difficulty reaching the attending provider, please page the [facility] (Director on Call) for [facility] on amion for assistance.

## 2019-08-09 NOTE — Progress Notes (Signed)
Pharmacy Antibiotic Note  Scott [name] is a 58 y.o. male admitted on 07/31/2019 with osteomyelitis. He has ESRD for which he undergoes peritoneal dialysis and receives this treatment overnight. 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 He underwent a successful recanalization of the left lower extremity for limb salvage performed by Dr [name] on 11/17. On 07/19/2019 he was taken for surgery by podiatry and underwent I&D of the infected bone and soft tissue of the left first metatarsal and amputation of left second toe with partial ray resection.  Wound cultures were sent from both and grew Achromobacter spp (started on TMP/SMZ as outpatient) and MRSE.  He was receiving ceftazidime and vancomycin via peritoneal dialysis.    Patient underwent right great toe amputation 12/8.  11/30 @ 1540 Vancomycin random  = 28 mcg/mL 12/2 @ 0620 Vancomycin random = 25 mcg/mL 12/4 @ 0512 Vancomycin random = 19 mcg/mL 12/6 @0535  Vancomycin random = 28 mcg/mL  12/9 @ 0448 Vancomycin random = 16 mcg/mL  Plan: 1. Vancomycin 1 g x1 today. Re-check levels in 3-4 days. 2. Continue bactrim 1 SS tab daily and ceftazidime 1gm IV q24h 3. Per ID anticipated antibiotic stop date is 08/18/19  Height: 5\' 9"  (175.3 cm) Weight: 206 lb 2.1 oz (93.5 kg) IBW/kg (Calculated) : 70.7  Temp (24hrs), Avg:98.1 F (36.7 C), Min:97.1 F (36.2 C), Max:98.5 F (36.9 C)  Recent Labs  Lab 08/05/19 1910 08/05/19 1914 08/06/19 0535 08/07/19 0533 08/07/19 1855 08/07/19 2233 08/08/19 0417 08/09/19 0448  WBC  --  12.0* 12.1* 13.6* 12.1*  --  12.0* 11.0*  CREATININE  --  8.57* 7.96* 8.06*  --   --  8.17* 8.04*  LATICACIDVEN 0.9  --   --   --  1.2 1.4  --   --   VANCORANDOM  --   --  28  --   --   --   --  16    Estimated Creatinine Clearance: 11 mL/min (A) (by C-G formula based on SCr of 8.04 mg/dL (H)).     Antimicrobials this admission: 11/16 ceftriaxone >> 11/19 11/16 vancomycin >>  11/20 ceftazidime >> 11/30 SMX/TMP (patient reported not taking to med rec) >>  Microbiology results: 12/7 Peritoneal fluid: No growth <24h 12/6 Bcx: NGx3 days 12/8 SARS CoV-2+ Influenza A/B: negative  11/18 WCx: Achromobacter (TMP/SMZ susc), MRSE.   11/17 MRSA PCR: negative  Thank you for allowing pharmacy to be a part of this patient's care.  [place] Resident 08/09/2019 7:55 AM

## 2019-08-09 NOTE — Progress Notes (Signed)
Pt stable no c/os

## 2019-08-09 NOTE — Progress Notes (Signed)
PT Cancellation Note  Patient Details Name: Scott [name] MRN: [id number] DOB: 1960-09-05   Cancelled Treatment:    Reason Eval/Treat Not Completed: Patient not medically ready(Pt underwent hallux/1st ray amputation 1DA. Transfers from 2A to SDU after procedure. Will complete PT order and await new PT order once pt is ready to begin therapy.)  9:22 AM, 08/09/19 Etta Grandchild, PT, DPT Physical Therapist - [facility]  [phone] ([place])    [place] Zehner C 08/09/2019, 9:22 AM

## 2019-08-09 NOTE — Progress Notes (Signed)
ID Pt had rt toe ray excision yesterday In step down after that for low BP Is getting PRBC now Feeling better Eating lunch  O/e awake and alert BP (!) 100/46   Pulse 73   Temp 98 F (36.7 C) (Oral)   Resp 11   Ht 5\' 9"  (1.753 m)   Wt 93.5 kg   SpO2 100%   BMI 30.44 kg/m    Chest b/l air entry HS irregular B/l leg dressing not removed  LABS  CBC Latest Ref Rng & Units 08/09/2019 08/08/2019 08/07/2019  WBC 4.0 - 10.5 K/uL 11.0(H) 12.0(H) 12.1(H)  Hemoglobin 13.0 - 17.0 g/dL 7.1(L) 7.3(L) 7.2(L)  Hematocrit 39.0 - 52.0 % 22.4(L) 24.2(L) 23.5(L)  Platelets 150 - 400 K/uL 197 225 209    CMP Latest Ref Rng & Units 08/09/2019 08/08/2019 08/07/2019  Glucose 70 - 99 mg/dL 128(H) 103(H) 126(H)  BUN 6 - 20 mg/dL 52(H) 51(H) 55(H)  Creatinine 0.61 - 1.24 mg/dL 8.04(H) 8.17(H) 8.06(H)  Sodium 135 - 145 mmol/L 135 136 139  Potassium 3.5 - 5.1 mmol/L 4.6 4.5 4.6  Chloride 98 - 111 mmol/L 98 100 102  CO2 22 - 32 mmol/L 25 25 27   Calcium 8.9 - 10.3 mg/dL 7.7(L) 8.1(L) 8.3(L)  Total Protein 6.5 - 8.1 g/dL - - -  Total Bilirubin 0.3 - 1.2 mg/dL - - -  Alkaline Phos 38 - 126 U/L - - -  AST 15 - 41 U/L - - -  ALT 0 - 44 U/L - - -    Micro 08/06/19 BC NG 08/07/19 peritoneal fluid culture NG  Impression/Recommendation  Peripheral arterial disease b/l feet gangrene Underwent rt great toe ray excision yesterday for gangrene  Left great oe and 2nd toe amputated in NOV- he is getting IV antibiotics until 08/18/19 for residual osteo Vanco/ceftazidime and Po bactrim  ESRD on PD  Discussed the management with the patient

## 2019-08-09 NOTE — Progress Notes (Signed)
[facility]  ROUNDING NOTE   Subjective:   Peritoneal dialysis treatment last night. Tolerated treatment well.  UF reported to be 740 cc Poor appetite but able to eat; no nausea or vomiting No SOB   .   Objective:  Vital signs in last 24 hours:  Temp:  [97.1 F (36.2 C)-98.5 F (36.9 C)] 98.5 F (36.9 C) (12/09 0800) Pulse Rate:  [50-80] 73 (12/09 1000) Resp:  [11-23] 17 (12/09 1000) BP: (77-125)/(27-78) 104/43 (12/09 1000) SpO2:  [91 %-100 %] 96 % (12/09 1000) Weight:  [93.4 kg-93.5 kg] 93.5 kg (12/09 0500)  Weight change: 0.05 kg Filed Weights   08/08/19 0618 08/08/19 1214 08/09/19 0500  Weight: 93.4 kg 93.4 kg 93.5 kg    Intake/Output: I/O last 3 completed shifts: In: 1034 [P.O.:290; I.V.:225; IV Piggyback:519] Out: 305 [Urine:300; Blood:5]   Intake/Output this shift:  Total I/O In: -  Out: 740 [Other:740]  Physical Exam: General: NAD   Head: Normocephalic, atraumatic. Moist oral mucosal membranes  Eyes: Anicteric,    Lungs:  Clear to auscultation, [place] O2  Heart: Regular rate and rhythm  Abdomen:  Soft, nontender,   Extremities: B/l feet bandaged  Neurologic: Alert and oriented  Access: PD catheter    Basic Metabolic Panel: Recent Labs  Lab 08/03/19 0559  08/05/19 1914 08/06/19 0535 08/07/19 0533 08/08/19 0417 08/09/19 0448  NA 139   < > 135 140 139 136 135  K 4.4   < > 4.8 4.6 4.6 4.5 4.6  CL 102   < > 101 101 102 100 98  CO2 25   < > 24 27 27 25 25   GLUCOSE 121*   < > 105* 96 126* 103* 128*  BUN 49*   < > 53* 49* 55* 51* 52*  CREATININE 7.32*   < > 8.57* 7.96* 8.06* 8.17* 8.04*  CALCIUM 8.3*   < > 7.8* 8.5* 8.3* 8.1* 7.7*  MG 2.5*  --   --   --   --  2.3  --   PHOS  --   --   --   --   --  5.1* 5.4*   < > = values in this interval not displayed.    Liver Function Tests: Recent Labs  Lab 08/05/19 1914 08/08/19 0417 08/09/19 0448  AST 20  --   --   ALT 10  --   --   ALKPHOS 92  --   --   BILITOT 0.6  --   --   PROT  5.4*  --   --   ALBUMIN 2.1* 2.2* 2.5*   Recent Labs  Lab 08/05/19 1914  LIPASE 22   No results for input(s): AMMONIA in the last 168 hours.  CBC: Recent Labs  Lab 08/06/19 0535 08/07/19 0533 08/07/19 1855 08/08/19 0417 08/09/19 0448  WBC 12.1* 13.6* 12.1* 12.0* 11.0*  HGB 7.8* 7.7* 7.2* 7.3* 7.1*  HCT 25.3* 24.6* 23.5* 24.2* 22.4*  MCV 96.2 96.5 100.0 100.0 97.4  PLT 227 208 209 225 197    Cardiac Enzymes: No results for input(s): CKTOTAL, CKMB, CKMBINDEX, TROPONINI in the last 168 hours.  BNP: Invalid input(s): POCBNP  CBG: Recent Labs  Lab 08/08/19 1226 08/08/19 1402 08/08/19 1755 08/08/19 2155 08/09/19 0720  GLUCAP 92 125* 95 120* 120*    Microbiology: Results for orders placed or performed during the hospital encounter of 07/31/19  CULTURE, BLOOD (ROUTINE X 2) w Reflex to ID Panel     Status: None (  Preliminary result)   Collection Time: 08/06/19 10:37 AM   Specimen: BLOOD  Result Value Ref Range Status   Specimen Description BLOOD LAC  Final   Special Requests   Final    BOTTLES DRAWN AEROBIC AND ANAEROBIC Blood Culture adequate volume   Culture   Final    NO GROWTH 3 DAYS Performed at [facility], [address], [place], [place] [address]    Report Status PENDING  Incomplete  CULTURE, BLOOD (ROUTINE X 2) w Reflex to ID Panel     Status: None (Preliminary result)   Collection Time: 08/06/19 10:37 AM   Specimen: BLOOD  Result Value Ref Range Status   Specimen Description BLOOD LEFT FOREARM  Final   Special Requests   Final    BOTTLES DRAWN AEROBIC AND ANAEROBIC Blood Culture results may not be optimal due to an inadequate volume of blood received in culture bottles   Culture   Final    NO GROWTH 3 DAYS Performed at [facility], [address], [place], [place] [address]    Report Status PENDING  Incomplete  Body fluid culture     Status: None (Preliminary result)   Collection Time: 08/07/19  9:00 AM   Specimen:  Peritoneal Washings; Peritoneal Fluid  Result Value Ref Range Status   Specimen Description   Final    PERITONEAL Performed at [facility], [address] [place], [place] [address]    Special Requests   Final    Normal Performed at [facility], [place], [place] [address]    Gram Stain   Final    RARE WBC PRESENT,BOTH PMN AND MONONUCLEAR NO ORGANISMS SEEN    Culture   Final    NO GROWTH < 24 HOURS Performed at [facility], [place] [address], [place], [place] [address]    Report Status PENDING  Incomplete  Respiratory Panel by RT PCR (Flu A&B, Covid) - Nasopharyngeal Swab     Status: None   Collection Time: 08/08/19 10:53 AM   Specimen: Nasopharyngeal Swab  Result Value Ref Range Status   SARS Coronavirus 2 by RT PCR NEGATIVE NEGATIVE Final    Comment: (NOTE) SARS-CoV-2 target nucleic acids are NOT DETECTED. The SARS-CoV-2 RNA is generally detectable in upper respiratoy specimens during the acute phase of infection. The lowest concentration of SARS-CoV-2 viral copies this assay can detect is 131 copies/mL. A negative result does not preclude SARS-Cov-2 infection and should not be used as the sole basis for treatment or other patient management decisions. A negative result may occur with  improper specimen collection/handling, submission of specimen other than nasopharyngeal swab, presence of viral mutation(s) within the areas targeted by this assay, and inadequate number of viral copies (<131 copies/mL). A negative result must be combined with clinical observations, patient history, and epidemiological information. The expected result is Negative. Fact Sheet for Patients:  PinkCheek.be Fact Sheet for Healthcare Providers:  GravelBags.it This test is not yet ap proved or cleared by the [place] FDA and  has been authorized for detection and/or diagnosis of SARS-CoV-2  by FDA under an Emergency Use Authorization (EUA). This EUA will remain  in effect (meaning this test can be used) for the duration of the COVID-19 declaration under Section 564(b)(1) of the Act, 21 U.S.C. section 360bbb-3(b)(1), unless the authorization is terminated or revoked sooner.    Influenza A by PCR NEGATIVE NEGATIVE Final   Influenza B by PCR NEGATIVE NEGATIVE Final  Comment: (NOTE) The Xpert Xpress SARS-CoV-2/FLU/RSV assay is intended as an aid in  the diagnosis of influenza from Nasopharyngeal swab specimens and  should not be used as a sole basis for treatment. Nasal washings and  aspirates are unacceptable for Xpert Xpress SARS-CoV-2/FLU/RSV  testing. Fact Sheet for Patients: PinkCheek.be Fact Sheet for Healthcare Providers: GravelBags.it This test is not yet approved or cleared by the [place] FDA and  has been authorized for detection and/or diagnosis of SARS-CoV-2 by  FDA under an Emergency Use Authorization (EUA). This EUA will remain  in effect (meaning this test can be used) for the duration of the  Covid-19 declaration under Section 564(b)(1) of the Act, 21  U.S.C. section 360bbb-3(b)(1), unless the authorization is  terminated or revoked. Performed at [facility], [address] [place],  [address]     Coagulation Studies: No results for input(s): LABPROT, INR in the last 72 hours.  Urinalysis: No results for input(s): COLORURINE, LABSPEC, PHURINE, GLUCOSEU, HGBUR, BILIRUBINUR, KETONESUR, PROTEINUR, UROBILINOGEN, NITRITE, LEUKOCYTESUR in the last 72 hours.  Invalid input(s): APPERANCEUR    Imaging: No results found.   Medications:   . cefTAZidime (FORTAZ)  IV Stopped (08/07/19 1525)  . dialysis solution 1.5% low-MG/low-CA     . aspirin EC  81 mg Oral Daily  . atorvastatin  40 mg Oral Daily  . calcitRIOL  0.25 mcg Oral Daily  . calcium acetate  2,001 mg Oral TID  WC  . Chlorhexidine Gluconate Cloth  6 each Topical Daily  . clopidogrel  75 mg Oral Q breakfast  . darbepoetin (ARANESP) injection - NON-DIALYSIS  40 mcg Subcutaneous Q Sat-1800  . gentamicin cream  1 application Topical Daily  . heparin injection (subcutaneous)  5,000 Units Subcutaneous Q8H  . insulin aspart  0-5 Units Subcutaneous QHS  . insulin aspart  0-9 Units Subcutaneous TID WC  . insulin aspart  2 Units Subcutaneous TID WC  . lactulose  20 g Oral Daily  . midodrine  5 mg Oral TID WC  . pantoprazole  40 mg Oral Daily  . polyethylene glycol  17 g Oral BID  . senna-docusate  2 tablet Oral BID  . sulfamethoxazole-trimethoprim  1 tablet Oral Daily  . vancomycin variable dose per unstable renal function (pharmacist dosing)   Does not apply See admin instructions   acetaminophen **OR** acetaminophen, albuterol, bisacodyl, calcium acetate, HYDROcodone-acetaminophen, methocarbamol, ondansetron **OR** ondansetron (ZOFRAN) IV, oxyCODONE-acetaminophen  Assessment/ Plan:  [name] is a 59 y.o. white male with end stage renal disease on peritoneal dialysis, hypertension, diabetes mellitus type II, peripheral vascular disease, left toe amputation, who is admitted to [facility] on 07/31/2019 for Right leg pain [M79.604] Dry gangrene ([place]) [I96] Ischemic leg pain [M79.606, I99.8]  [facility] [address] PD CAPD 4 exchanges 2081mL fills  1. End Stage Renal Disease: continue peritoneal dialysis.  CCPD 10 hours 5 exchanges 2 liter fills - 1.5% dextrose - continue midodrine for low BP, increase dose to 10 mg TID  2. Peripheral vascular disease with osteomyelitis, grangrene S/p Amputation right great toe with partial first ray resection on 12/8, Dr Cleda Mccreedy  Vascular intervention on 12/1 and 12/4 by Dr. [name] - TMP/SMX, vanco, ceftazidime.  -  underwent amputation of toe today - may need IJ Picc for Abx administration at home.  Tentative plan of Abx till 12/8  3.  Anemia of chronic  kidney disease:   - Aranesp 29mcg administered on 12/5 - pharmacy consulted  4. Secondary Hyperparathyroidism  with hypocalcemia.   - calcitriol - calcium acetate with meals.  Lab Results  Component Value Date   PTH 127 (H) 07/21/2019   CALCIUM 7.7 (L) 08/09/2019   PHOS 5.4 (H) 08/09/2019      LOS: 9 Scott [name] 12/9/202010:51 AM

## 2019-08-10 LAB — BPAM RBC
Blood Product Expiration Date: 202101022359
ISSUE DATE / TIME: 202012091208
Unit Type and Rh: 5100

## 2019-08-10 LAB — GLUCOSE, CAPILLARY
Glucose-Capillary: 97 mg/dL (ref 70–99)
Glucose-Capillary: 117 mg/dL — ABNORMAL HIGH (ref 70–99)
Glucose-Capillary: 103 mg/dL — ABNORMAL HIGH (ref 70–99)
Glucose-Capillary: 117 mg/dL — ABNORMAL HIGH (ref 70–99)

## 2019-08-10 LAB — CBC
HCT: 27.7 % — ABNORMAL LOW (ref 39.0–52.0)
Hemoglobin: 8.7 g/dL — ABNORMAL LOW (ref 13.0–17.0)
MCH: 30.7 pg (ref 26.0–34.0)
MCHC: 31.4 g/dL (ref 30.0–36.0)
MCV: 97.9 fL (ref 80.0–100.0)
Platelets: 217 10*3/uL (ref 150–400)
RBC: 2.83 MIL/uL — ABNORMAL LOW (ref 4.22–5.81)
RDW: 16.8 % — ABNORMAL HIGH (ref 11.5–15.5)
WBC: 10.9 10*3/uL — ABNORMAL HIGH (ref 4.0–10.5)
nRBC: 0 % (ref 0.0–0.2)

## 2019-08-10 LAB — RENAL FUNCTION PANEL
Albumin: 2.3 g/dL — ABNORMAL LOW (ref 3.5–5.0)
Anion gap: 11 (ref 5–15)
BUN: 48 mg/dL — ABNORMAL HIGH (ref 6–20)
CO2: 26 mmol/L (ref 22–32)
Calcium: 8.6 mg/dL — ABNORMAL LOW (ref 8.9–10.3)
Chloride: 101 mmol/L (ref 98–111)
Creatinine, Ser: 7.47 mg/dL — ABNORMAL HIGH (ref 0.61–1.24)
GFR calc Af Amer: 8 mL/min — ABNORMAL LOW (ref >60)
GFR calc non Af Amer: 7 mL/min — ABNORMAL LOW (ref >60)
Glucose, Bld: 105 mg/dL — ABNORMAL HIGH (ref 70–99)
Phosphorus: 4.9 mg/dL — ABNORMAL HIGH (ref 2.5–4.6)
Potassium: 4.5 mmol/L (ref 3.5–5.1)
Sodium: 138 mmol/L (ref 135–145)

## 2019-08-10 LAB — TYPE AND SCREEN
ABO/RH(D): O POS
Antibody Screen: NEGATIVE
Unit division: 0

## 2019-08-10 NOTE — Progress Notes (Signed)
[facility]  ROUNDING NOTE   Subjective:   Peritoneal dialysis treatment last night. Tolerated treatment well.   UG 670 cc Eating some No SOB   .   Objective:  Vital signs in last 24 hours:  Temp:  [97.7 F (36.5 C)-100 F (37.8 C)] 98.6 F (37 C) (12/10 0800) Pulse Rate:  [35-74] 35 (12/10 0800) Resp:  [11-26] 20 (12/10 0800) BP: (80-128)/(14-116) 128/116 (12/10 0800) SpO2:  [92 %-100 %] 94 % (12/10 0800) Weight:  [91.4 kg] 91.4 kg (12/10 0500)  Weight change: -2 kg Filed Weights   08/08/19 1214 08/09/19 0500 08/10/19 0500  Weight: 93.4 kg 93.5 kg 91.4 kg    Intake/Output: I/O last 3 completed shifts: In: 1066.7 [P.O.:360; Blood:455; IV Piggyback:251.7] Out: 815 [Urine:75; Other:740]   Intake/Output this shift:  Total I/O In: 48.3 [IV Piggyback:48.3] Out: 0   Physical Exam: General: NAD   Head: Normocephalic, atraumatic. Moist oral mucosal membranes  Eyes: Anicteric,    Lungs:  Clear to auscultation, [place] O2  Heart: Regular rate and rhythm  Abdomen:  Soft, nontender,   Extremities: B/l feet bandaged  Neurologic: Alert and oriented  Access: PD catheter    Basic Metabolic Panel: Recent Labs  Lab 08/06/19 0535 08/07/19 0533 08/08/19 0417 08/09/19 0448 08/10/19 0556  NA 140 139 136 135 138  K 4.6 4.6 4.5 4.6 4.5  CL 101 102 100 98 101  CO2 27 27 25 25 26   GLUCOSE 96 126* 103* 128* 105*  BUN 49* 55* 51* 52* 48*  CREATININE 7.96* 8.06* 8.17* 8.04* 7.47*  CALCIUM 8.5* 8.3* 8.1* 7.7* 8.6*  MG  --   --  2.3  --   --   PHOS  --   --  5.1* 5.4* 4.9*    Liver Function Tests: Recent Labs  Lab 08/05/19 1914 08/08/19 0417 08/09/19 0448 08/10/19 0556  AST 20  --   --   --   ALT 10  --   --   --   ALKPHOS 92  --   --   --   BILITOT 0.6  --   --   --   PROT 5.4*  --   --   --   ALBUMIN 2.1* 2.2* 2.5* 2.3*   Recent Labs  Lab 08/05/19 1914  LIPASE 22   No results for input(s): AMMONIA in the last 168 hours.  CBC: Recent Labs  Lab  08/07/19 0533 08/07/19 1855 08/08/19 0417 08/09/19 0448 08/10/19 0556  WBC 13.6* 12.1* 12.0* 11.0* 10.9*  HGB 7.7* 7.2* 7.3* 7.1* 8.7*  HCT 24.6* 23.5* 24.2* 22.4* 27.7*  MCV 96.5 100.0 100.0 97.4 97.9  PLT 208 209 225 197 217    Cardiac Enzymes: No results for input(s): CKTOTAL, CKMB, CKMBINDEX, TROPONINI in the last 168 hours.  BNP: Invalid input(s): POCBNP  CBG: Recent Labs  Lab 08/09/19 0720 08/09/19 1127 08/09/19 1703 08/09/19 2142 08/10/19 0847  GLUCAP 120* 158* 76 125* 51    Microbiology: Results for orders placed or performed during the hospital encounter of 07/31/19  CULTURE, BLOOD (ROUTINE X 2) w Reflex to ID Panel     Status: None (Preliminary result)   Collection Time: 08/06/19 10:37 AM   Specimen: BLOOD  Result Value Ref Range Status   Specimen Description BLOOD LAC  Final   Special Requests   Final    BOTTLES DRAWN AEROBIC AND ANAEROBIC Blood Culture adequate volume   Culture   Final    NO GROWTH 4  DAYS Performed at [facility], [address] [place], [place] [address]    Report Status PENDING  Incomplete  CULTURE, BLOOD (ROUTINE X 2) w Reflex to ID Panel     Status: None (Preliminary result)   Collection Time: 08/06/19 10:37 AM   Specimen: BLOOD  Result Value Ref Range Status   Specimen Description BLOOD LEFT FOREARM  Final   Special Requests   Final    BOTTLES DRAWN AEROBIC AND ANAEROBIC Blood Culture results may not be optimal due to an inadequate volume of blood received in culture bottles   Culture   Final    NO GROWTH 4 DAYS Performed at [facility], [address] [place], [place] [address]    Report Status PENDING  Incomplete  Body fluid culture     Status: None (Preliminary result)   Collection Time: 08/07/19  9:00 AM   Specimen: Peritoneal Washings; Peritoneal Fluid  Result Value Ref Range Status   Specimen Description   Final    PERITONEAL Performed at [facility], [address]  [place], [place] [address]    Special Requests   Final    Normal Performed at [facility], [address] [place], [place] [address]    Gram Stain   Final    RARE WBC PRESENT,BOTH PMN AND MONONUCLEAR NO ORGANISMS SEEN    Culture   Final    NO GROWTH 2 DAYS Performed at [facility], [place] [address] [place], [place] [address]    Report Status PENDING  Incomplete  Respiratory Panel by RT PCR (Flu A&B, Covid) - Nasopharyngeal Swab     Status: None   Collection Time: 08/08/19 10:53 AM   Specimen: Nasopharyngeal Swab  Result Value Ref Range Status   SARS Coronavirus 2 by RT PCR NEGATIVE NEGATIVE Final    Comment: (NOTE) SARS-CoV-2 target nucleic acids are NOT DETECTED. The SARS-CoV-2 RNA is generally detectable in upper respiratoy specimens during the acute phase of infection. The lowest concentration of SARS-CoV-2 viral copies this assay can detect is 131 copies/mL. A negative result does not preclude SARS-Cov-2 infection and should not be used as the sole basis for treatment or other patient management decisions. A negative result may occur with  improper specimen collection/handling, submission of specimen other than nasopharyngeal swab, presence of viral mutation(s) within the areas targeted by this assay, and inadequate number of viral copies (<131 copies/mL). A negative result must be combined with clinical observations, patient history, and epidemiological information. The expected result is Negative. Fact Sheet for Patients:  PinkCheek.be Fact Sheet for Healthcare Providers:  GravelBags.it This test is not yet ap proved or cleared by the [place] FDA and  has been authorized for detection and/or diagnosis of SARS-CoV-2 by FDA under an Emergency Use Authorization (EUA). This EUA will remain  in effect (meaning this test can be used) for the duration of the COVID-19 declaration under Section 564(b)(1)  of the Act, 21 U.S.C. section 360bbb-3(b)(1), unless the authorization is terminated or revoked sooner.    Influenza A by PCR NEGATIVE NEGATIVE Final   Influenza B by PCR NEGATIVE NEGATIVE Final    Comment: (NOTE) The Xpert Xpress SARS-CoV-2/FLU/RSV assay is intended as an aid in  the diagnosis of influenza from Nasopharyngeal swab specimens and  should not be used as a sole basis for treatment. Nasal washings and  aspirates are unacceptable for Xpert Xpress SARS-CoV-2/FLU/RSV  testing. Fact Sheet for Patients: PinkCheek.be Fact Sheet for Healthcare Providers:  GravelBags.it This test is not yet approved or cleared by the [place] and  has been authorized for detection and/or diagnosis of SARS-CoV-2 by  FDA under an Emergency Use Authorization (EUA). This EUA will remain  in effect (meaning this test can be used) for the duration of the  Covid-19 declaration under Section 564(b)(1) of the Act, 21  U.S.C. section 360bbb-3(b)(1), unless the authorization is  terminated or revoked. Performed at [facility], [address] [place], [place] [address]     Coagulation Studies: No results for input(s): LABPROT, INR in the last 72 hours.  Urinalysis: No results for input(s): COLORURINE, LABSPEC, PHURINE, GLUCOSEU, HGBUR, BILIRUBINUR, KETONESUR, PROTEINUR, UROBILINOGEN, NITRITE, LEUKOCYTESUR in the last 72 hours.  Invalid input(s): APPERANCEUR    Imaging: No results found.   Medications:   . cefTAZidime (FORTAZ)  IV Stopped (08/09/19 1559)  . dialysis solution 1.5% low-MG/low-CA     . aspirin EC  81 mg Oral Daily  . atorvastatin  40 mg Oral Daily  . calcitRIOL  0.25 mcg Oral Daily  . calcium acetate  2,001 mg Oral TID WC  . Chlorhexidine Gluconate Cloth  6 each Topical Daily  . clopidogrel  75 mg Oral Q breakfast  . darbepoetin (ARANESP) injection - NON-DIALYSIS  40 mcg Subcutaneous Q Sat-1800  .  gentamicin cream  1 application Topical Daily  . heparin injection (subcutaneous)  5,000 Units Subcutaneous Q8H  . insulin aspart  0-5 Units Subcutaneous QHS  . insulin aspart  0-9 Units Subcutaneous TID WC  . insulin aspart  2 Units Subcutaneous TID WC  . lactulose  20 g Oral Daily  . midodrine  10 mg Oral TID WC  . pantoprazole  40 mg Oral Daily  . polyethylene glycol  17 g Oral BID  . senna-docusate  2 tablet Oral BID  . sulfamethoxazole-trimethoprim  1 tablet Oral Daily  . vancomycin variable dose per unstable renal function (pharmacist dosing)   Does not apply See admin instructions   acetaminophen **OR** acetaminophen, albuterol, bisacodyl, calcium acetate, HYDROcodone-acetaminophen, methocarbamol, ondansetron **OR** ondansetron (ZOFRAN) IV, oxyCODONE-acetaminophen  Assessment/ Plan:  Mr. JON LALL is a 59 y.o. white male with end stage renal disease on peritoneal dialysis, hypertension, diabetes mellitus type II, peripheral vascular disease, left toe amputation, who is admitted to [facility] on 07/31/2019 for Right leg pain [M79.604] Dry gangrene ([place]) [I96] Ischemic leg pain [M79.606, I99.8]  [facility] PD CAPD 4 exchanges 2036mL fills  1. End Stage Renal Disease: continue peritoneal dialysis.  CCPD 10 hours 5 exchanges 2 liter fills - 1.5% dextrose - continue midodrine for low BP,    2. Peripheral vascular disease with osteomyelitis, grangrene S/p Amputation right great toe with partial first ray resection on 12/8, Dr Cleda Mccreedy  Vascular intervention on 12/1 and 12/4 by Dr. [name] - TMP/SMX, vanco, ceftazidime.  -  underwent amputation of toe 12/9 - may need IJ Picc for Abx administration at home.  Tentative plan of Abx till 12/18  3.  Anemia of chronic kidney disease:   - Aranesp ordered  4. Secondary Hyperparathyroidism with hypocalcemia.   - calcitriol - calcium acetate with meals.  Lab Results  Component Value Date   PTH 127 (H)  07/21/2019   CALCIUM 8.6 (L) 08/10/2019   PHOS 4.9 (H) 08/10/2019      LOS: 10 Scott [name] 12/10/20208:52 AM

## 2019-08-10 NOTE — Progress Notes (Signed)
PT Cancellation Note  Patient Details Name: Scott [name] MRN: [id number] DOB: [birth year]   Cancelled Treatment:    Reason Eval/Treat Not Completed: Other (comment)(multiple attempts) Attempted to see pt post new R toe amputation.  First attempt in CCU and pt willing to participate but needed bed pan, came back later and pt getting ready to transfer out of CCU to floor 149. Will re-attempt eval tomorrow.  Clarified [place] with surgeon with "minimal heel-only WBing bilaterally."    Kreg Shropshire, DPT 08/10/2019, 3:54 PM

## 2019-08-10 NOTE — Progress Notes (Signed)
Pt transferred to RM # [id number] at this time. VSS prior to transfer. Report given to [employer].

## 2019-08-10 NOTE — Progress Notes (Signed)
  ID Pt is getting peritoneal dialysis. He asked me to check his feet tomorrow

## 2019-08-10 NOTE — TOC Progression Note (Addendum)
Transition of Care [facility]) - Progression Note    Patient Details  Name: Scott [name] MRN: [id number] Date of Birth: 07-08-1961  Transition of Care [facility] CM/SW Contact  Shade Flood, LCSW Phone Number: 08/10/2019, 10:46 AM  Clinical Narrative:     TOC following. Plan of care continues. Pt had toe amputations and is to continue on IV antibiotics until 12/18. Pt has been active with [facility] for PT prior to admission. Pt was apparently having the IV anbx administered through the PD catheter prior to this admission. MD indicates pt may dc with an IJ line for the remainder of the anbx treatment. Notified Pam with [facility] of pt and they can assist with his IV needs. Exact dc timeframe not yet known.  Plan remains for return home with [facility] and IV anbx (unless he remains hospitalized through the end point of the antibiotic treatment). TOC will follow.  Expected Discharge Plan: [place] Barriers to Discharge: Continued Medical Work up  Expected Discharge Plan and Services Expected Discharge Plan: [place] In-house Referral: Clinical Social Work   Post Acute Care Choice: [place] arrangements for the past 2 months: Single Family Home                 DME Arranged: N/A DME Agency: NA       HH Arranged: RN, PT, OT, Nurse's Aide, Social Work [employer] Agency: Microbiologist ([place]) Date [place]: 08/04/19 Time [place]: 1200 Representative spoke with at [place]: [place] ([place]) Interventions    Readmission Risk Interventions Readmission Risk Prevention Plan 08/04/2019 07/19/2019 07/19/2019  Transportation Screening Complete Complete Complete  Medication Review Press photographer) Referral to Pharmacy Complete Complete  [place] or Home Care Consult Complete Complete Complete  SW Recovery Care/Counseling Consult Complete - -  Palliative Care Screening Not  Applicable Not Applicable -  [place] Not Applicable Not Applicable -  Some recent data might be hidden

## 2019-08-10 NOTE — Progress Notes (Signed)
[facility] Progress Note  Patient: Scott [name] XHB:[id number]   PCP: [place] DOB: 04/12/1961   DOA: 07/31/2019   DOS: 08/10/2019   Date of Service: the patient was seen and examined on 08/10/2019  Chief Complaint  Patient presents with  . Leg Pain   Brief hospital course: [name] an 58 y.o.malewith medical history significant ofperipheral arterial disease, diabetes mellitus type 2, ESRD on peritoneal dialysis presents to the hospital with complaints of right lower extremity pain. Patient was admitted here last week for left lower extremity pain diagnosed with osteomyelitis requiring angiogram by vascular followed by amputation of left second toe by podiatry. He was eventually discharged on IV antibiotics. He had been feeling better but started experiencing right lower extremity pain over the last couple of days. Initially pain was with exertion but now has progressed to persistent and his toes have darkened. Underwent angioplasty of right profunda femoris artery on 08/01/2019. Angioplasty of right superficial femoral and popliteal arteries as well as right peroneal artery on 08/04/2019. Nephrology was consulted to continue peritoneal dialysis here in the hospital. Podiatry has evaluated the patient and currently recommending conservative measures but reevaluation on Monday. ID recommends continuing vancomycin plus cefepime plus Bactrim until 08/18/2019.  Currently further plan is continue IV antibiotics and monitor patient's progress. treat hypotension and monitor recovery after the amputation  Subjective: denies any shortness of breath. Still has some soreness in his right foot. Wants to try and get up and move around  Assessment and Plan: Scheduled Meds: . aspirin EC  81 mg Oral Daily  . atorvastatin  40 mg Oral Daily  . calcitRIOL  0.25 mcg Oral Daily  . calcium acetate  2,001 mg Oral TID WC  . Chlorhexidine Gluconate Cloth  6 each  Topical Daily  . clopidogrel  75 mg Oral Q breakfast  . darbepoetin (ARANESP) injection - NON-DIALYSIS  40 mcg Subcutaneous Q Sat-1800  . gentamicin cream  1 application Topical Daily  . heparin injection (subcutaneous)  5,000 Units Subcutaneous Q8H  . insulin aspart  0-5 Units Subcutaneous QHS  . insulin aspart  0-9 Units Subcutaneous TID WC  . insulin aspart  2 Units Subcutaneous TID WC  . lactulose  20 g Oral Daily  . midodrine  10 mg Oral TID WC  . pantoprazole  40 mg Oral Daily  . polyethylene glycol  17 g Oral BID  . senna-docusate  2 tablet Oral BID  . sulfamethoxazole-trimethoprim  1 tablet Oral Daily  . vancomycin variable dose per unstable renal function (pharmacist dosing)   Does not apply See admin instructions   Continuous Infusions: . cefTAZidime (FORTAZ)  IV Stopped (08/09/19 1559)  . dialysis solution 1.5% low-MG/low-CA     PRN Meds: acetaminophen **OR** acetaminophen, albuterol, bisacodyl, calcium acetate, HYDROcodone-acetaminophen, methocarbamol, ondansetron **OR** ondansetron (ZOFRAN) IV, oxyCODONE-acetaminophen  Right lower leg gangrene. Severe peripheral vascular disease Recent left toe osteomyelitis. Vascular surgery was consulted. Patient has undergone percutaneous angioplasty of multiple blood vessels in the right leg in a staged fashion on 12/1 and 08/04/2019. Was treated with IV heparin infusion as well. We will continue with antiplatelet therapy for now. Appreciate vascular surgery assistance in managing this patient. Continue increase gabapentin SP amputation of the right great toe. Per podiatry patient can be discharged home from their perspective. Per ID recommendation is to continue current IV antibiotics.  Essential hypertension. Chronic systolic CHF Persistent hypotension Patient is on multiple antihypertensive medication. Blood pressure currently remaining soft. Currently not  on any antihypertensive medication. We will continue with Flomax  for now. Treated with IV fluids as well as IV albumin.  He was also started on midodrine due to persistent hypotension..  Anemia of chronic disease:  Hemoglobin trended down postoperatively to 7.1.  He was feeling generally weak and had been hypotensive.  He was transfused 1 unit prbc on 12/9 with improvement of hemoglobin and blood pressure. Continue to follow.  ESRD on peritoneal dialysis:  Nephrology consulted. Appreciate their assistance in managing this patient. Continuing PD here at the hospital. If the patient has to go to SNF he will require to be transitioned to HD. Fistula on right extremity appears to be working.  Type 2 diabetes mellitus uncontrolled with hyperglycemia associated with vascular complication Continue with sliding scale insulin. Hemoglobin A1c 6.0 on 07/17/2019.  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  Continue aspirin and Lipitor  Recent osteomyelitis of left toe: Continue antibiotics (ceftazidime, vancomycin and Bactrim) Last day 08/18/2019.  Abdominal pain Severe constipation  Continue with the bowel regimen and add lactulose  Obesity  Body mass index is 29.76 kg/m.  Dietary consult  Diet: Regular diet  DVT Prophylaxis: Subcutaneous Heparin    Advance goals of care discussion: Full code  Family Communication: no family was present at bedside, at the time of interview.  Disposition:  Discharge to be determined. Will request physical therapy evaluation today  Consultants: Vascular surgery, podiatry, nephrology, infectious disease Procedures: Angiography and angioplasty of the right leg vessels  Antibiotics: Anti-infectives (From admission, onward)   Start     Dose/Rate Route Frequency Ordered Stop   08/09/19 1000  vancomycin (VANCOCIN) IVPB 1000 mg/200 mL premix     1,000 mg 200 mL/hr over 60 Minutes Intravenous  Once 08/09/19 0807 08/09/19 1039   08/08/19  1225  ceFAZolin (ANCEF) 2-4 GM/100ML-% IVPB    Note to Pharmacy: Lyman Bishop   : cabinet override      08/08/19 1225 08/09/19 0029   08/05/19 1000  vancomycin (VANCOCIN) IVPB 1000 mg/200 mL premix     1,000 mg 200 mL/hr over 60 Minutes Intravenous  Once 08/04/19 1144 08/05/19 1506   08/04/19 0950  clindamycin (CLEOCIN) 300 MG/50ML IVPB    Note to Pharmacy: Maynor, Erin   : cabinet override      08/04/19 0950 08/04/19 2159   08/04/19 0730  clindamycin (CLEOCIN) IVPB 300 mg    Note to Pharmacy: To be given in specials   300 mg 100 mL/hr over 30 Minutes Intravenous  Once 08/04/19 0715 08/04/19 1057   08/01/19 1245  ceFAZolin (ANCEF) IVPB 1 g/50 mL premix    Note to Pharmacy: To be given in specials   1 g 100 mL/hr over 30 Minutes Intravenous  Once 08/01/19 1234 08/01/19 1426   07/31/19 1600  cefTAZidime (FORTAZ) 1 g in sodium chloride 0.9 % 100 mL IVPB     1 g 200 mL/hr over 30 Minutes Intravenous Every 24 hours 07/31/19 1520     07/31/19 1530  vancomycin variable dose per unstable renal function (pharmacist dosing)      Does not apply See admin instructions 07/31/19 1530     07/31/19 1215  sulfamethoxazole-trimethoprim (BACTRIM) 400-80 MG per tablet 1 tablet     1 tablet Oral Daily 07/31/19 1021     07/31/19 1030  cefTAZidime (FORTAZ) IVPB  Status:  Discontinued    Note to Pharmacy: Indication:  cellulitis Last  Day of Therapy:  08/07/19 Please Administer this Antibiotic Intraperitoneally     1 g Intravenous Every 24 hours 07/31/19 1021 07/31/19 1514   07/31/19 1030  vancomycin IVPB  Status:  Discontinued    Note to Pharmacy: Indication:  cellulits Last Day of Therapy:  08/04/19 Please Administer Antibiotic Intraperitoneally     2,000 mg Intravenous 2 times weekly 07/31/19 1021 07/31/19 1529       Objective: Physical Exam: Vitals:   08/10/19 0600 08/10/19 0700 08/10/19 0800 08/10/19 0900  BP: (!) 113/49 (!) 80/66 (!) 128/116 (!) 105/54  Pulse: 74 (!) 49 (!) 35 73  Resp: 15  17 20 12   Temp:   98.6 F (37 C)   TempSrc:   Oral   SpO2: 99% 98% 94% 100%  Weight:      Height:        Intake/Output Summary (Last 24 hours) at 08/10/2019 1023 Last data filed at 08/10/2019 0900 Gross per 24 hour  Intake 1115.06 ml  Output 753 ml  Net 362.06 ml   Filed Weights   08/08/19 1214 08/09/19 0500 08/10/19 0500  Weight: 93.4 kg 93.5 kg 91.4 kg   General exam: Alert, awake, oriented x 3 Respiratory system: Clear to auscultation. Respiratory effort normal. Cardiovascular system:RRR. No murmurs, rubs, gallops. Gastrointestinal system: Abdomen is nondistended, soft and nontender. No organomegaly or masses felt. Normal bowel sounds heard. Central nervous system: Alert and oriented. No focal neurological deficits. Extremities: bilateral feet are in wrapped in dressings with ace bandages Skin: No rashes, lesions or ulcers Psychiatry: Judgement and insight appear normal. Mood & affect appropriate.   Data Reviewed: I have personally reviewed and interpreted daily labs, tele strips, imagings as discussed above. I reviewed all nursing notes, pharmacy notes, vitals, pertinent old records I have discussed plan of care as described above with RN and patient/family.  CBC: Recent Labs  Lab 08/07/19 0533 08/07/19 1855 08/08/19 0417 08/09/19 0448 08/10/19 0556  WBC 13.6* 12.1* 12.0* 11.0* 10.9*  HGB 7.7* 7.2* 7.3* 7.1* 8.7*  HCT 24.6* 23.5* 24.2* 22.4* 27.7*  MCV 96.5 100.0 100.0 97.4 97.9  PLT 208 209 225 197 485   Basic Metabolic Panel: Recent Labs  Lab 08/06/19 0535 08/07/19 0533 08/08/19 0417 08/09/19 0448 08/10/19 0556  NA 140 139 136 135 138  K 4.6 4.6 4.5 4.6 4.5  CL 101 102 100 98 101  CO2 27 27 25 25 26   GLUCOSE 96 126* 103* 128* 105*  BUN 49* 55* 51* 52* 48*  CREATININE 7.96* 8.06* 8.17* 8.04* 7.47*  CALCIUM 8.5* 8.3* 8.1* 7.7* 8.6*  MG  --   --  2.3  --   --   PHOS  --   --  5.1* 5.4* 4.9*    Liver Function Tests: Recent Labs  Lab  08/05/19 1914 08/08/19 0417 08/09/19 0448 08/10/19 0556  AST 20  --   --   --   ALT 10  --   --   --   ALKPHOS 92  --   --   --   BILITOT 0.6  --   --   --   PROT 5.4*  --   --   --   ALBUMIN 2.1* 2.2* 2.5* 2.3*   Recent Labs  Lab 08/05/19 1914  LIPASE 22   No results for input(s): AMMONIA in the last 168 hours. Coagulation Profile: No results for input(s): INR, PROTIME in the last 168 hours. Cardiac Enzymes: No results for input(s): CKTOTAL, CKMB, CKMBINDEX,  TROPONINI in the last 168 hours. BNP (last 3 results) No results for input(s): PROBNP in the last 8760 hours. CBG: Recent Labs  Lab 08/09/19 0720 08/09/19 1127 08/09/19 1703 08/09/19 2142 08/10/19 0847  GLUCAP 120* 158* 76 125* 97   Studies: No results found.   Time spent: 35 minutes  Author: Kathie Dike, MD [facility] 08/10/2019 10:23 AM  To reach On-call, see care teams to locate the attending and reach out to them via [email or web address]. If 7PM-7AM, please contact night-coverage If you still have difficulty reaching the attending provider, please page the [facility] (Director on Call) for [facility] on amion for assistance.

## 2019-08-10 NOTE — Progress Notes (Signed)
[facility] Daily Progress Note   Subjective: 08/04/19:  1. Introduction catheter intorightlower extremity 3rd order catheter placementleft common femoral approach 2.Contrast injectionrightlower extremity for distal runoff  3. Ultrasound-guided access to theright posterior tibialartery 4.Percutaneous transluminal angioplasty and stent placement rightsuperficial femoral and popliteal arteries to 6 mm with Viabahn stents 4. Percutaneous transluminal angioplastyright peroneal to 2.5  5. Star close closure leftcommon femoral arteriotomy  08/01/19: 1. Introduction catheter intorightlower extremity 3rd order catheter placement  2.Contrast injectionrightlower extremity for distal runoff  3. Percutaneous transluminal angioplastyright profunda femorisartery 4. Star close closureleftcommon femoral arteriotomy  Somewhat persistent but improved RLE discomfort. No issues overnight.  Objective: Vitals:   08/10/19 0600 08/10/19 0700 08/10/19 0800 08/10/19 0900  BP: (!) 113/49 (!) 80/66 (!) 128/116 (!) 105/54  Pulse: 74 (!) 49 (!) 35 73  Resp: 15 17 20 12   Temp:   98.6 F (37 C)   TempSrc:   Oral   SpO2: 99% 98% 94% 100%  Weight:      Height:        Intake/Output Summary (Last 24 hours) at 08/10/2019 1000 Last data filed at 08/10/2019 0900 Gross per 24 hour  Intake 1115.06 ml  Output 753 ml  Net 362.06 ml   Physical Exam: A&Ox3, NAD CV: RRR Pulmonary: CTA Bilaterally Abdomen: Soft, Nontender, Nondistended             PD Catheter: intact, no signs of infection noted Vascular:             Right Lower Extremity: Thigh soft.  Calf soft.  Extremities warm distally to toes. Motor/sensory is intact.  No signs of compartment syndrome. Podiatry dressing intact, clean and dry.    Laboratory: CBC    Component Value  Date/Time   WBC 10.9 (H) 08/10/2019 0556   HGB 8.7 (L) 08/10/2019 0556   HGB 10.7 (L) 12/13/2014 0102   HCT 27.7 (L) 08/10/2019 0556   HCT 33.8 (L) 12/13/2014 0102   PLT 217 08/10/2019 0556   PLT 510 (H) 12/13/2014 0102   BMET    Component Value Date/Time   NA 138 08/10/2019 0556   NA 140 04/16/2015 0000   NA 140 12/13/2014 0102   K 4.5 08/10/2019 0556   K 4.6 12/13/2014 0102   CL 101 08/10/2019 0556   CL 111 12/13/2014 0102   CO2 26 08/10/2019 0556   CO2 23 12/13/2014 0102   GLUCOSE 105 (H) 08/10/2019 0556   GLUCOSE 213 (H) 12/13/2014 0102   BUN 48 (H) 08/10/2019 0556   BUN 44 (A) 04/16/2015 0000   BUN 25 (H) 12/13/2014 0102   CREATININE 7.47 (H) 08/10/2019 0556   CREATININE 1.95 (H) 12/13/2014 0102   CALCIUM 8.6 (L) 08/10/2019 0556   CALCIUM 8.0 (L) 12/13/2014 0102   GFRNONAA 7 (L) 08/10/2019 0556   GFRNONAA 37 (L) 12/13/2014 0102   GFRAA 8 (L) 08/10/2019 0556   GFRAA 43 (L) 12/13/2014 0102   Assessment/Planning: The patient is a 58 year old male with multiple medical issues including peripheral artery disease with gangrenous changes to the toes status post endovascular intervention x2 1) peripheral artery disease with gangrene: s/p endovascular intervention x2.  Patient now has two-vessel runoff.  The patient's physical exam is notable for nonpalpable pedal pulses however his distal calf and foot are much warmer when compared to last week's exam. 2) end-stage renal disease: Currently maintained on peritoneal dialysis without issue. 3) Vascular to sign off at this time, will continue to follow in  the outpatient setting.  Discussed with Dr. Eber Hong Stegmayer PA-C 08/10/2019 10:00 AM

## 2019-08-11 ENCOUNTER — Other Ambulatory Visit (INDEPENDENT_AMBULATORY_CARE_PROVIDER_SITE_OTHER): Payer: Self-pay | Admitting: Vascular Surgery

## 2019-08-11 ENCOUNTER — Inpatient Hospital Stay
Admission: EM | Disposition: A | Discharge status: Home / Self Care | Payer: Self-pay | Source: Home / Self Care | Attending: Internal Medicine

## 2019-08-11 DIAGNOSIS — N186 End stage renal disease: Secondary | ICD-10-CM

## 2019-08-11 DIAGNOSIS — Z992 Dependence on renal dialysis: Secondary | ICD-10-CM

## 2019-08-11 HISTORY — PX: CENTRAL LINE INSERTION-TUNNELED: CATH118291

## 2019-08-11 LAB — BODY FLUID CULTURE
Culture: NO GROWTH
Special Requests: NORMAL

## 2019-08-11 LAB — CBC
HCT: 27.6 % — ABNORMAL LOW (ref 39.0–52.0)
Hemoglobin: 9 g/dL — ABNORMAL LOW (ref 13.0–17.0)
MCH: 30.6 pg (ref 26.0–34.0)
MCHC: 32.6 g/dL (ref 30.0–36.0)
MCV: 93.9 fL (ref 80.0–100.0)
Platelets: 263 10*3/uL (ref 150–400)
RBC: 2.94 MIL/uL — ABNORMAL LOW (ref 4.22–5.81)
RDW: 16.3 % — ABNORMAL HIGH (ref 11.5–15.5)
WBC: 11.1 10*3/uL — ABNORMAL HIGH (ref 4.0–10.5)
nRBC: 0 % (ref 0.0–0.2)

## 2019-08-11 LAB — CULTURE, BLOOD (ROUTINE X 2)
Culture: NO GROWTH
Culture: NO GROWTH
Special Requests: ADEQUATE

## 2019-08-11 LAB — RENAL FUNCTION PANEL
Albumin: 2.3 g/dL — ABNORMAL LOW (ref 3.5–5.0)
Anion gap: 11 (ref 5–15)
BUN: 46 mg/dL — ABNORMAL HIGH (ref 6–20)
CO2: 27 mmol/L (ref 22–32)
Calcium: 8.8 mg/dL — ABNORMAL LOW (ref 8.9–10.3)
Chloride: 102 mmol/L (ref 98–111)
Creatinine, Ser: 7.47 mg/dL — ABNORMAL HIGH (ref 0.61–1.24)
GFR calc Af Amer: 8 mL/min — ABNORMAL LOW (ref >60)
GFR calc non Af Amer: 7 mL/min — ABNORMAL LOW (ref >60)
Glucose, Bld: 98 mg/dL (ref 70–99)
Phosphorus: 4.2 mg/dL (ref 2.5–4.6)
Potassium: 4.4 mmol/L (ref 3.5–5.1)
Sodium: 140 mmol/L (ref 135–145)

## 2019-08-11 LAB — GLUCOSE, CAPILLARY
Glucose-Capillary: 105 mg/dL — ABNORMAL HIGH (ref 70–99)
Glucose-Capillary: 102 mg/dL — ABNORMAL HIGH (ref 70–99)
Glucose-Capillary: 92 mg/dL (ref 70–99)
Glucose-Capillary: 121 mg/dL — ABNORMAL HIGH (ref 70–99)

## 2019-08-11 LAB — VANCOMYCIN, RANDOM: Vancomycin Rm: 26

## 2019-08-11 SURGERY — CENTRAL LINE INSERTION-TUNNELED
Anesthesia: Choice

## 2019-08-11 MED ORDER — RENA-VITE PO TABS
1.0000 | ORAL_TABLET | Freq: Every day | ORAL | Status: DC
  Administered 2019-08-11 – 2019-08-12 (×2): 1 via ORAL
  Filled 2019-08-11 (×3): qty 1

## 2019-08-11 MED ORDER — FAMOTIDINE 20 MG PO TABS: 40.0000 mg | ORAL_TABLET | Freq: Once | ORAL | Status: DC | PRN

## 2019-08-11 MED ORDER — CLINDAMYCIN PHOSPHATE 300 MG/50ML IV SOLN
INTRAVENOUS | Status: AC
  Filled 2019-08-11: qty 50

## 2019-08-11 MED ORDER — ONDANSETRON HCL 4 MG/2ML IJ SOLN: 4.0000 mg | Freq: Four times a day (QID) | INTRAMUSCULAR | Status: DC | PRN

## 2019-08-11 MED ORDER — MIDAZOLAM HCL 2 MG/ML PO SYRP: 8.0000 mg | ORAL_SOLUTION | Freq: Once | ORAL | Status: DC | PRN

## 2019-08-11 MED ORDER — NEPRO/CARBSTEADY PO LIQD
237.0000 mL | Freq: Two times a day (BID) | ORAL | Status: DC
  Administered 2019-08-12: 237 mL via ORAL

## 2019-08-11 MED ORDER — SODIUM CHLORIDE 0.9 % IV SOLN
1.0000 g | INTRAVENOUS | Status: DC
  Administered 2019-08-12: 1 g via INTRAVENOUS
  Filled 2019-08-11 (×2): qty 1

## 2019-08-11 MED ORDER — HYDROMORPHONE HCL 1 MG/ML IJ SOLN: 1.0000 mg | Freq: Once | INTRAMUSCULAR | Status: DC | PRN

## 2019-08-11 MED ORDER — METHYLPREDNISOLONE SODIUM SUCC 125 MG IJ SOLR: 125.0000 mg | Freq: Once | INTRAMUSCULAR | Status: DC | PRN

## 2019-08-11 MED ORDER — FENTANYL CITRATE (PF) 100 MCG/2ML IJ SOLN
INTRAMUSCULAR | Status: AC
  Filled 2019-08-11: qty 2

## 2019-08-11 MED ORDER — DIPHENHYDRAMINE HCL 50 MG/ML IJ SOLN: 50.0000 mg | Freq: Once | INTRAMUSCULAR | Status: DC | PRN

## 2019-08-11 MED ORDER — PHENAZOPYRIDINE HCL 200 MG PO TABS
200.0000 mg | ORAL_TABLET | Freq: Once | ORAL | Status: AC
  Administered 2019-08-11: 200 mg via ORAL
  Filled 2019-08-11: qty 1

## 2019-08-11 MED ORDER — SODIUM CHLORIDE 0.9 % IV SOLN: INTRAVENOUS | Status: DC

## 2019-08-11 MED ORDER — PHENAZOPYRIDINE HCL 200 MG PO TABS: 200.0000 mg | ORAL_TABLET | Freq: Three times a day (TID) | ORAL | Status: DC

## 2019-08-11 MED ORDER — CLINDAMYCIN PHOSPHATE 300 MG/50ML IV SOLN
300.0000 mg | Freq: Once | INTRAVENOUS | Status: AC
  Administered 2019-08-11: 300 mg via INTRAVENOUS

## 2019-08-11 MED ORDER — MIDODRINE HCL 5 MG PO TABS
5.0000 mg | ORAL_TABLET | Freq: Three times a day (TID) | ORAL | Status: DC
  Administered 2019-08-11 – 2019-08-12 (×4): 5 mg via ORAL
  Filled 2019-08-11 (×3): qty 1

## 2019-08-11 MED ORDER — MIDAZOLAM HCL 2 MG/2ML IJ SOLN
INTRAMUSCULAR | Status: DC | PRN
  Administered 2019-08-11: 3 mg via INTRAVENOUS

## 2019-08-11 MED ORDER — VITAMIN C 500 MG PO TABS
500.0000 mg | ORAL_TABLET | Freq: Two times a day (BID) | ORAL | Status: DC
  Administered 2019-08-11 – 2019-08-12 (×3): 500 mg via ORAL
  Filled 2019-08-11 (×3): qty 1

## 2019-08-11 MED ORDER — MIDAZOLAM HCL 5 MG/5ML IJ SOLN
INTRAMUSCULAR | Status: AC
  Filled 2019-08-11: qty 5

## 2019-08-11 SURGICAL SUPPLY — 7 items
BIOPATCH RED 1 DISK 7.0 (GAUZE/BANDAGES/DRESSINGS) ×2 IMPLANT
BIOPATCH RED 1IN DISK 7.0MM (GAUZE/BANDAGES/DRESSINGS) ×1
COVER PROBE U/S 5X48 (MISCELLANEOUS) ×3 IMPLANT
DERMABOND ADVANCED (GAUZE/BANDAGES/DRESSINGS) ×2
DERMABOND ADVANCED .7 DNX12 (GAUZE/BANDAGES/DRESSINGS) ×1 IMPLANT
KIT SINGLE LUMEN POWERLINE 5FR (CATHETERS) ×3 IMPLANT
PACK ANGIOGRAPHY (CUSTOM PROCEDURE TRAY) ×3 IMPLANT

## 2019-08-11 NOTE — Progress Notes (Signed)
Patient suffers from CHF which impairs their ability to perform daily activities like bathing and feeding in the home.  A walker will not resolve  issue with performing activities of daily living. A wheelchair will allow patient to safely perform daily activities. Patient is not able to propel themselves in the home using a standard weight wheelchair due to general weakness. Patient can self propel in the lightweight wheelchair. Length of need Lifetime. Accessories: elevating leg rests (ELRs), wheel locks, extensions and anti-tippers.

## 2019-08-11 NOTE — Progress Notes (Signed)
3 Days Post-Op   Subjective/Chief Complaint: Patient seen.  He has been having some significant pain in the right foot.  States he is ready to go home.   Objective: Vital signs in last 24 hours: Temp:  [97.4 F (36.3 C)-98.2 F (36.8 C)] 97.4 F (36.3 C) (12/11 0844) Pulse Rate:  [71-110] 81 (12/11 0844) Resp:  [12-22] 18 (12/11 0844) BP: (81-165)/(37-98) 137/47 (12/11 0844) SpO2:  [93 %-100 %] 98 % (12/11 0844) Last BM Date: 08/10/19  Intake/Output from previous day: 12/10 0701 - 12/11 0700 In: 288.3 [P.O.:240; IV Piggyback:48.3] Out: 703 [Urine:25] Intake/Output this shift: No intake/output data recorded.  Bandage on the right foot is dry and intact.  Upon removal the incision is well coapted with some mild incisional dusky and cyanotic appearance.  No purulence or sign of infection.  Erythema and edema resolving.  Lab Results:  Recent Labs    08/10/19 0556 08/11/19 0539  WBC 10.9* 11.1*  HGB 8.7* 9.0*  HCT 27.7* 27.6*  PLT 217 263   BMET Recent Labs    08/10/19 0556 08/11/19 0539  NA 138 140  K 4.5 4.4  CL 101 102  CO2 26 27  GLUCOSE 105* 98  BUN 48* 46*  CREATININE 7.47* 7.47*  CALCIUM 8.6* 8.8*   PT/INR No results for input(s): LABPROT, INR in the last 72 hours. ABG No results for input(s): PHART, HCO3 in the last 72 hours.  Invalid input(s): PCO2, PO2  Studies/Results: No results found.  Anti-infectives: Anti-infectives (From admission, onward)   Start     Dose/Rate Route Frequency Ordered Stop   08/09/19 1000  vancomycin (VANCOCIN) IVPB 1000 mg/200 mL premix     1,000 mg 200 mL/hr over 60 Minutes Intravenous  Once 08/09/19 0807 08/09/19 1039   08/08/19 1225  ceFAZolin (ANCEF) 2-4 GM/100ML-% IVPB    Note to Pharmacy: Lyman Bishop   : cabinet override      08/08/19 1225 08/09/19 0029   08/05/19 1000  vancomycin (VANCOCIN) IVPB 1000 mg/200 mL premix     1,000 mg 200 mL/hr over 60 Minutes Intravenous  Once 08/04/19 1144 08/05/19 1506   08/04/19 0950  clindamycin (CLEOCIN) 300 MG/50ML IVPB    Note to Pharmacy: Maynor, Erin   : cabinet override      08/04/19 0950 08/04/19 2159   08/04/19 0730  clindamycin (CLEOCIN) IVPB 300 mg    Note to Pharmacy: To be given in specials   300 mg 100 mL/hr over 30 Minutes Intravenous  Once 08/04/19 0715 08/04/19 1057   08/01/19 1245  ceFAZolin (ANCEF) IVPB 1 g/50 mL premix    Note to Pharmacy: To be given in specials   1 g 100 mL/hr over 30 Minutes Intravenous  Once 08/01/19 1234 08/01/19 1426   07/31/19 1600  cefTAZidime (FORTAZ) 1 g in sodium chloride 0.9 % 100 mL IVPB     1 g 200 mL/hr over 30 Minutes Intravenous Every 24 hours 07/31/19 1520     07/31/19 1530  vancomycin variable dose per unstable renal function (pharmacist dosing)      Does not apply See admin instructions 07/31/19 1530     07/31/19 1215  sulfamethoxazole-trimethoprim (BACTRIM) 400-80 MG per tablet 1 tablet     1 tablet Oral Daily 07/31/19 1021     07/31/19 1030  cefTAZidime (FORTAZ) IVPB  Status:  Discontinued    Note to Pharmacy: Indication:  cellulitis Last Day of Therapy:  08/07/19 Please Administer this Antibiotic Intraperitoneally  1 g Intravenous Every 24 hours 07/31/19 1021 07/31/19 1514   07/31/19 1030  vancomycin IVPB  Status:  Discontinued    Note to Pharmacy: Indication:  cellulits Last Day of Therapy:  08/04/19 Please Administer Antibiotic Intraperitoneally     2,000 mg Intravenous 2 times weekly 07/31/19 1021 07/31/19 1529      Assessment/Plan: s/p Procedure(s): AMPUTATION RAY GREAT TOE (Right) Assessment: Gangrene status post ray resection stable.   Plan: Betadine and a sterile gauze bandage applied to the incision.  Patient will keep these clean dry and intact until seen in the office.  Instructed on only limited weightbearing with pressure only on the heels using his wedge shoes for transfer and going to the bathroom.  Follow-up early next week for reevaluation.  LOS: 11 days    Durward Fortes 08/11/2019

## 2019-08-11 NOTE — TOC Progression Note (Signed)
Transition of Care [facility]) - Progression Note    Patient Details  Name: Scott [name] MRN: [id number] Date of Birth: 1961-01-09  Transition of Care [facility]) CM/SW [place], [place] Phone Number: 08/11/2019, 3:26 PM  Clinical Narrative:     CSW talked with Pam at [place]. Pam is aware that the patient will be discharged tomorrow and agreed that home health can handle the at home antibiotics. She will be contacting the patient's friend to make her aware.  CSW talked with Leroy Sea with [employer] who will be obtaining the wheelchair for the patient.    No other needs.   Expected Discharge Plan: [place] Barriers to Discharge: Continued Medical Work up  Expected Discharge Plan and Services Expected Discharge Plan: [place] In-house Referral: Clinical Social Work   Post Acute Care Choice: [place] arrangements for the past 2 months: Single Family Home                 DME Arranged: N/A DME Agency: NA       HH Arranged: RN, PT, OT, Nurse's Aide, Social Work [employer] Agency: Microbiologist ([place]) Date [place]: 08/04/19 Time Donda Friedli: 1200 Representative spoke with at [place]: [place] ([place]) Interventions    Readmission Risk Interventions Readmission Risk Prevention Plan 08/04/2019 07/19/2019 07/19/2019  Transportation Screening Complete Complete Complete  Medication Review Press photographer) Referral to Pharmacy Complete Complete  [place] or Home Care Consult Complete Complete Complete  SW Recovery Care/Counseling Consult Complete - -  Palliative Care Screening Not Applicable Not Applicable -  [place] Not Applicable Not Applicable -  Some recent data might be hidden

## 2019-08-11 NOTE — Treatment Plan (Addendum)
Diagnosis: Gangrene/osteomyelitis ESRD  No Known Allergies  OPAT Orders Discharge antibiotics: Vancomycin 1 gram IV PB  X 2 doses  One on 12/14 and one dose on 12/18  Ceftazidime 1 gram IV every 24 hours until 08/18/19 [facility] Care Per Protocol:  Labs weekly while on IV antibiotics: __X CBC with differential _X_ CMP __X CRP _X_ ESR   pt has HICKMAN which will be removed later in the hospital Fax weekly labs to [phone]  Clinic Follow Up Appt:   Call [phone] to make appt next week

## 2019-08-11 NOTE — Progress Notes (Signed)
Pt c/o of penis leaking/incontinence with some pain at the tip. Stated that this is new to him. [place]-NP made aware.

## 2019-08-11 NOTE — Progress Notes (Signed)
[facility]  ROUNDING NOTE   Subjective:   Peritoneal dialysis treatment last night. Tolerated treatment well.  Ultrafiltration about 700 cc Eating some.  Denies nausea or vomiting No SOB   .   Objective:  Vital signs in last 24 hours:  Temp:  [97.4 F (36.3 C)-98.2 F (36.8 C)] 97.4 F (36.3 C) (12/11 0844) Pulse Rate:  [72-84] 81 (12/11 0844) Resp:  [12-19] 18 (12/11 0844) BP: (116-165)/(37-54) 137/47 (12/11 0844) SpO2:  [93 %-98 %] 98 % (12/11 0844)  Weight change:  Filed Weights   08/08/19 1214 08/09/19 0500 08/10/19 0500  Weight: 93.4 kg 93.5 kg 91.4 kg    Intake/Output: I/O last 3 completed shifts: In: 408.3 [P.O.:360; IV Piggyback:48.3] Out: 778 [Urine:100; Other:678]   Intake/Output this shift:  Total I/O In: -  Out: 50 [Urine:50]  Physical Exam: General: NAD   Head: Normocephalic, atraumatic. Moist oral mucosal membranes  Eyes: Anicteric,    Lungs:  Clear to auscultation  Heart: Regular rate and rhythm  Abdomen:  Soft, nontender,   Extremities: B/l feet bandaged  Neurologic: Alert and oriented  Access: PD catheter    Basic Metabolic Panel: Recent Labs  Lab 08/07/19 0533 08/08/19 0417 08/09/19 0448 08/10/19 0556 08/11/19 0539  NA 139 136 135 138 140  K 4.6 4.5 4.6 4.5 4.4  CL 102 100 98 101 102  CO2 27 25 25 26 27   GLUCOSE 126* 103* 128* 105* 98  BUN 55* 51* 52* 48* 46*  CREATININE 8.06* 8.17* 8.04* 7.47* 7.47*  CALCIUM 8.3* 8.1* 7.7* 8.6* 8.8*  MG  --  2.3  --   --   --   PHOS  --  5.1* 5.4* 4.9* 4.2    Liver Function Tests: Recent Labs  Lab 08/05/19 1914 08/08/19 0417 08/09/19 0448 08/10/19 0556 08/11/19 0539  AST 20  --   --   --   --   ALT 10  --   --   --   --   ALKPHOS 92  --   --   --   --   BILITOT 0.6  --   --   --   --   PROT 5.4*  --   --   --   --   ALBUMIN 2.1* 2.2* 2.5* 2.3* 2.3*   Recent Labs  Lab 08/05/19 1914  LIPASE 22   No results for input(s): AMMONIA in the last 168  hours.  CBC: Recent Labs  Lab 08/07/19 1855 08/08/19 0417 08/09/19 0448 08/10/19 0556 08/11/19 0539  WBC 12.1* 12.0* 11.0* 10.9* 11.1*  HGB 7.2* 7.3* 7.1* 8.7* 9.0*  HCT 23.5* 24.2* 22.4* 27.7* 27.6*  MCV 100.0 100.0 97.4 97.9 93.9  PLT 209 225 197 217 263    Cardiac Enzymes: No results for input(s): CKTOTAL, CKMB, CKMBINDEX, TROPONINI in the last 168 hours.  BNP: Invalid input(s): POCBNP  CBG: Recent Labs  Lab 08/10/19 1123 08/10/19 1648 08/10/19 2111 08/11/19 0746 08/11/19 1149  GLUCAP 117* 103* 117* 105* 102*    Microbiology: Results for orders placed or performed during the hospital encounter of 07/31/19  CULTURE, BLOOD (ROUTINE X 2) w Reflex to ID Panel     Status: None   Collection Time: 08/06/19 10:37 AM   Specimen: BLOOD  Result Value Ref Range Status   Specimen Description BLOOD LAC  Final   Special Requests   Final    BOTTLES DRAWN AEROBIC AND ANAEROBIC Blood Culture adequate volume   Culture   Final  NO GROWTH 5 DAYS Performed at [facility], [address] [place], [place] [address]    Report Status 08/11/2019 FINAL  Final  CULTURE, BLOOD (ROUTINE X 2) w Reflex to ID Panel     Status: None   Collection Time: 08/06/19 10:37 AM   Specimen: BLOOD  Result Value Ref Range Status   Specimen Description BLOOD LEFT FOREARM  Final   Special Requests   Final    BOTTLES DRAWN AEROBIC AND ANAEROBIC Blood Culture results may not be optimal due to an inadequate volume of blood received in culture bottles   Culture   Final    NO GROWTH 5 DAYS Performed at [facility], [address], [place], [place] [address]    Report Status 08/11/2019 FINAL  Final  Body fluid culture     Status: None   Collection Time: 08/07/19  9:00 AM   Specimen: Peritoneal Washings; Peritoneal Fluid  Result Value Ref Range Status   Specimen Description   Final    PERITONEAL Performed at [facility], [address] [place], [place] [address]     Special Requests   Final    Normal Performed at [facility], [place], [place] [address]    Gram Stain   Final    RARE WBC PRESENT,BOTH PMN AND MONONUCLEAR NO ORGANISMS SEEN    Culture   Final    NO GROWTH 3 DAYS Performed at [facility], [place] [address] [place], [place] [address]    Report Status 08/11/2019 FINAL  Final  Respiratory Panel by RT PCR (Flu A&B, Covid) - Nasopharyngeal Swab     Status: None   Collection Time: 08/08/19 10:53 AM   Specimen: Nasopharyngeal Swab  Result Value Ref Range Status   SARS Coronavirus 2 by RT PCR NEGATIVE NEGATIVE Final    Comment: (NOTE) SARS-CoV-2 target nucleic acids are NOT DETECTED. The SARS-CoV-2 RNA is generally detectable in upper respiratoy specimens during the acute phase of infection. The lowest concentration of SARS-CoV-2 viral copies this assay can detect is 131 copies/mL. A negative result does not preclude SARS-Cov-2 infection and should not be used as the sole basis for treatment or other patient management decisions. A negative result may occur with  improper specimen collection/handling, submission of specimen other than nasopharyngeal swab, presence of viral mutation(s) within the areas targeted by this assay, and inadequate number of viral copies (<131 copies/mL). A negative result must be combined with clinical observations, patient history, and epidemiological information. The expected result is Negative. Fact Sheet for Patients:  PinkCheek.be Fact Sheet for Healthcare Providers:  GravelBags.it This test is not yet ap proved or cleared by the [place] FDA and  has been authorized for detection and/or diagnosis of SARS-CoV-2 by FDA under an Emergency Use Authorization (EUA). This EUA will remain  in effect (meaning this test can be used) for the duration of the COVID-19 declaration under Section 564(b)(1) of the Act, 21  U.S.C. section 360bbb-3(b)(1), unless the authorization is terminated or revoked sooner.    Influenza A by PCR NEGATIVE NEGATIVE Final   Influenza B by PCR NEGATIVE NEGATIVE Final    Comment: (NOTE) The Xpert Xpress SARS-CoV-2/FLU/RSV assay is intended as an aid in  the diagnosis of influenza from Nasopharyngeal swab specimens and  should not be used as a sole basis for treatment. Nasal washings and  aspirates are unacceptable for Xpert Xpress SARS-CoV-2/FLU/RSV  testing. Fact Sheet for Patients: PinkCheek.be Fact Sheet for  Healthcare Providers: GravelBags.it This test is not yet approved or cleared by the [place] and  has been authorized for detection and/or diagnosis of SARS-CoV-2 by  FDA under an Emergency Use Authorization (EUA). This EUA will remain  in effect (meaning this test can be used) for the duration of the  Covid-19 declaration under Section 564(b)(1) of the Act, 21  U.S.C. section 360bbb-3(b)(1), unless the authorization is  terminated or revoked. Performed at [facility], [address] [place], [place] [address]     Coagulation Studies: No results for input(s): LABPROT, INR in the last 72 hours.  Urinalysis: No results for input(s): COLORURINE, LABSPEC, PHURINE, GLUCOSEU, HGBUR, BILIRUBINUR, KETONESUR, PROTEINUR, UROBILINOGEN, NITRITE, LEUKOCYTESUR in the last 72 hours.  Invalid input(s): APPERANCEUR    Imaging: No results found.   Medications:   . cefTAZidime (FORTAZ)  IV Stopped (08/10/19 1800)  . dialysis solution 1.5% low-MG/low-CA     . aspirin EC  81 mg Oral Daily  . atorvastatin  40 mg Oral Daily  . calcitRIOL  0.25 mcg Oral Daily  . calcium acetate  2,001 mg Oral TID WC  . Chlorhexidine Gluconate Cloth  6 each Topical Daily  . clopidogrel  75 mg Oral Q breakfast  . darbepoetin (ARANESP) injection - NON-DIALYSIS  40 mcg Subcutaneous Q Sat-1800  . feeding  supplement (NEPRO CARB STEADY)  237 mL Oral BID BM  . gentamicin cream  1 application Topical Daily  . heparin injection (subcutaneous)  5,000 Units Subcutaneous Q8H  . insulin aspart  0-5 Units Subcutaneous QHS  . insulin aspart  0-9 Units Subcutaneous TID WC  . insulin aspart  2 Units Subcutaneous TID WC  . lactulose  20 g Oral Daily  . midodrine  5 mg Oral TID WC  . multivitamin  1 tablet Oral QHS  . pantoprazole  40 mg Oral Daily  . polyethylene glycol  17 g Oral BID  . senna-docusate  2 tablet Oral BID  . sulfamethoxazole-trimethoprim  1 tablet Oral Daily  . vancomycin variable dose per unstable renal function (pharmacist dosing)   Does not apply See admin instructions  . vitamin C  500 mg Oral BID   acetaminophen **OR** acetaminophen, albuterol, bisacodyl, calcium acetate, HYDROcodone-acetaminophen, methocarbamol, ondansetron **OR** ondansetron (ZOFRAN) IV, oxyCODONE-acetaminophen  Assessment/ Plan:  [name] is a 58 y.o. white male with end stage renal disease on peritoneal dialysis, hypertension, diabetes mellitus type II, peripheral vascular disease, left toe amputation, who is admitted to [facility] on 07/31/2019 for Right leg pain [M79.604] Dry gangrene ([place]) [I96] Ischemic leg pain [M79.606, I99.8]  [facility] CAPD 4 exchanges 2068mL fills  1. End Stage Renal Disease: continue peritoneal dialysis.  CCPD 10 hours 5 exchanges 2 liter fills - 1.5% dextrose - continue midodrine for low BP,    2. Peripheral vascular disease with osteomyelitis, grangrene S/p Amputation right great toe with partial first ray resection on 12/8, Dr Cleda Mccreedy  Vascular intervention on 12/1 and 12/4 by Dr. [name] - TMP/SMX, vanco, ceftazidime.  -  underwent amputation of toe 12/9 - may need IJ Picc for Abx administration at home.  Tentative plan of Abx till 12/18  3.  Anemia of chronic kidney disease:   - Aranesp ordered  4. Secondary Hyperparathyroidism with  hypocalcemia.   - calcitriol - calcium acetate with meals.  Lab Results  Component Value Date   PTH 127 (H) 07/21/2019   CALCIUM 8.8 (L) 08/11/2019   PHOS 4.2  08/11/2019      LOS: [place] 12/11/202012:10 PM

## 2019-08-11 NOTE — Progress Notes (Signed)
PT Cancellation Note  Patient Details Name: Scott [name] MRN: [id number] DOB: [birth year]   Cancelled Treatment:    Reason Eval/Treat Not Completed: Patient at procedure or test/unavailable Attempted to see pt this AM and we needed to wait on an ortho wedge shoe.  Later pt was out of room for PICC placement and when back in room he had low BP (115/38) and nursing reports that he was very sleepy.  Deferred PT yet again today. Given his WBing limitations we will only plan on doing transfers and pt will need a wheel-chair for mobility to insure best chance at wound healing and maintaining as minimal as possible heel-only [place], DPT 08/11/2019, 3:05 PM

## 2019-08-11 NOTE — Progress Notes (Signed)
[facility] Progress Note  Patient: Scott [name] EXB:[id number]   PCP: [place] DOB: 1960-11-20   DOA: 07/31/2019   DOS: 08/11/2019   Date of Service: the patient was seen and examined on 08/11/2019  Chief Complaint  Patient presents with  . Leg Pain   Brief hospital course: Medardo Hassing Herseyis an 58 y.o.malewith medical history significant ofperipheral arterial disease, diabetes mellitus type 2, ESRD on peritoneal dialysis presents to the hospital with complaints of right lower extremity pain. Patient was admitted here last week for left lower extremity pain diagnosed with osteomyelitis requiring angiogram by vascular followed by amputation of left second toe by podiatry. He was eventually discharged on IV antibiotics. He had been feeling better but started experiencing right lower extremity pain over the last couple of days. Initially pain was with exertion but now has progressed to persistent and his toes have darkened. Underwent angioplasty of right profunda femoris artery on 08/01/2019. Angioplasty of right superficial femoral and popliteal arteries as well as right peroneal artery on 08/04/2019. Nephrology was consulted to continue peritoneal dialysis here in the hospital. Podiatry has evaluated the patient and currently recommending conservative measures but reevaluation on Monday. ID recommends continuing vancomycin plus cefepime plus Bactrim until 08/18/2019.  Currently further plan is continue IV antibiotics and monitor patient's progress. treat hypotension and monitor recovery after the amputation  Subjective: Still has soreness in his feet bilaterally.  Denies any shortness of breath.  Wants to go home.  Assessment and Plan: Scheduled Meds: . aspirin EC  81 mg Oral Daily  . atorvastatin  40 mg Oral Daily  . calcitRIOL  0.25 mcg Oral Daily  . calcium acetate  2,001 mg Oral TID WC  . Chlorhexidine Gluconate Cloth  6 each Topical Daily  .  clopidogrel  75 mg Oral Q breakfast  . darbepoetin (ARANESP) injection - NON-DIALYSIS  40 mcg Subcutaneous Q Sat-1800  . feeding supplement (NEPRO CARB STEADY)  237 mL Oral BID BM  . gentamicin cream  1 application Topical Daily  . heparin injection (subcutaneous)  5,000 Units Subcutaneous Q8H  . insulin aspart  0-5 Units Subcutaneous QHS  . insulin aspart  0-9 Units Subcutaneous TID WC  . insulin aspart  2 Units Subcutaneous TID WC  . lactulose  20 g Oral Daily  . midazolam      . midodrine  5 mg Oral TID WC  . multivitamin  1 tablet Oral QHS  . pantoprazole  40 mg Oral Daily  . polyethylene glycol  17 g Oral BID  . senna-docusate  2 tablet Oral BID  . sulfamethoxazole-trimethoprim  1 tablet Oral Daily  . vancomycin variable dose per unstable renal function (pharmacist dosing)   Does not apply See admin instructions  . vitamin C  500 mg Oral BID   Continuous Infusions: . cefTAZidime (FORTAZ)  IV 1 g (08/11/19 1634)  . [START ON 08/12/2019] cefTAZidime (FORTAZ)  IV    . clindamycin    . dialysis solution 1.5% low-MG/low-CA     PRN Meds: acetaminophen **OR** acetaminophen, albuterol, bisacodyl, calcium acetate, HYDROcodone-acetaminophen, HYDROmorphone (DILAUDID) injection, methocarbamol, ondansetron **OR** ondansetron (ZOFRAN) IV, ondansetron (ZOFRAN) IV, oxyCODONE-acetaminophen  Right lower leg gangrene. Severe peripheral vascular disease Recent left toe osteomyelitis. Vascular surgery was consulted. Patient has undergone percutaneous angioplasty of multiple blood vessels in the right leg in a staged fashion on 12/1 and 08/04/2019. Was treated with IV heparin infusion as well. We will continue with antiplatelet therapy for now. Appreciate vascular  surgery assistance in managing this patient. Continue increase gabapentin SP amputation of the right great toe. Per podiatry patient can be discharged home from their perspective. Per ID recommendation is to continue current IV  antibiotics.  Essential hypertension. Chronic systolic CHF Persistent hypotension Patient is on multiple antihypertensive medication. Blood pressure currently remaining soft. Currently not on any antihypertensive medication. We will continue with Flomax for now. Treated with IV fluids as well as IV albumin.  He was also started on midodrine due to persistent hypotension..  Anemia of chronic disease:  Hemoglobin trended down postoperatively to 7.1.  He was feeling generally weak and had been hypotensive.  He was transfused 1 unit prbc on 12/9 with improvement of hemoglobin and blood pressure. Continue to follow.  ESRD on peritoneal dialysis:  Nephrology consulted. Appreciate their assistance in managing this patient.   Type 2 diabetes mellitus uncontrolled with hyperglycemia associated with vascular complication Continue with sliding scale insulin. Hemoglobin A1c 6.0 on 07/17/2019.  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  Continue aspirin and Lipitor  Recent osteomyelitis of left toe: Continue antibiotics (ceftazidime, vancomycin and Bactrim) Last day 08/18/2019.  Arrangements are being made for outpatient antibiotics.  Vascular surgery has placed Hickman's catheter for IV antibiotics which will be removed by them after antibiotics are complete  Abdominal pain Severe constipation  Continue with the bowel regimen and add lactulose  Obesity  Body mass index is 29.76 kg/m.  Dietary consult  Diet: Regular diet  DVT Prophylaxis: Subcutaneous Heparin    Advance goals of care discussion: Full code  Family Communication: no family was present at bedside, at the time of interview.  Disposition:  Discharge to be determined. Will request physical therapy evaluation today  Consultants: Vascular surgery, podiatry, nephrology, infectious disease Procedures: Angiography and angioplasty of the right  leg vessels  Antibiotics: Anti-infectives (From admission, onward)   Start     Dose/Rate Route Frequency Ordered Stop   08/12/19 1000  cefTAZidime (FORTAZ) 1 g in sodium chloride 0.9 % 100 mL IVPB     1 g 200 mL/hr over 30 Minutes Intravenous Every 24 hours 08/11/19 1523     08/12/19 0000  clindamycin (CLEOCIN) IVPB 300 mg    Note to Pharmacy: To be given in specials   300 mg 100 mL/hr over 30 Minutes Intravenous  Once 08/11/19 1304 08/11/19 1340   08/11/19 1305  clindamycin (CLEOCIN) 300 MG/50ML IVPB    Note to Pharmacy: Maynor, Erin   : cabinet override      08/11/19 1305 08/12/19 0114   08/09/19 1000  vancomycin (VANCOCIN) IVPB 1000 mg/200 mL premix     1,000 mg 200 mL/hr over 60 Minutes Intravenous  Once 08/09/19 0807 08/09/19 1039   08/08/19 1225  ceFAZolin (ANCEF) 2-4 GM/100ML-% IVPB    Note to Pharmacy: Lyman Bishop   : cabinet override      08/08/19 1225 08/09/19 0029   08/05/19 1000  vancomycin (VANCOCIN) IVPB 1000 mg/200 mL premix     1,000 mg 200 mL/hr over 60 Minutes Intravenous  Once 08/04/19 1144 08/05/19 1506   08/04/19 0950  clindamycin (CLEOCIN) 300 MG/50ML IVPB    Note to Pharmacy: Maynor, Erin   : cabinet override      08/04/19 0950 08/04/19 2159   08/04/19 0730  clindamycin (CLEOCIN) IVPB 300 mg    Note to Pharmacy: To be given in specials   300 mg 100 mL/hr over  30 Minutes Intravenous  Once 08/04/19 0715 08/04/19 1057   08/01/19 1245  ceFAZolin (ANCEF) IVPB 1 g/50 mL premix    Note to Pharmacy: To be given in specials   1 g 100 mL/hr over 30 Minutes Intravenous  Once 08/01/19 1234 08/01/19 1426   07/31/19 1600  cefTAZidime (FORTAZ) 1 g in sodium chloride 0.9 % 100 mL IVPB     1 g 200 mL/hr over 30 Minutes Intravenous Every 24 hours 07/31/19 1520 08/12/19 1559   07/31/19 1530  vancomycin variable dose per unstable renal function (pharmacist dosing)      Does not apply See admin instructions 07/31/19 1530     07/31/19 1215  sulfamethoxazole-trimethoprim  (BACTRIM) 400-80 MG per tablet 1 tablet     1 tablet Oral Daily 07/31/19 1021     07/31/19 1030  cefTAZidime (FORTAZ) IVPB  Status:  Discontinued    Note to Pharmacy: Indication:  cellulitis Last Day of Therapy:  08/07/19 Please Administer this Antibiotic Intraperitoneally     1 g Intravenous Every 24 hours 07/31/19 1021 07/31/19 1514   07/31/19 1030  vancomycin IVPB  Status:  Discontinued    Note to Pharmacy: Indication:  cellulits Last Day of Therapy:  08/04/19 Please Administer Antibiotic Intraperitoneally     2,000 mg Intravenous 2 times weekly 07/31/19 1021 07/31/19 1529       Objective: Physical Exam: Vitals:   08/11/19 1400 08/11/19 1415 08/11/19 1443 08/11/19 1617  BP: (!) 134/45 (!) 114/33 (!) 115/38 99/62  Pulse: 88  81 76  Resp: 15 15 16 16   Temp:    98 F (36.7 C)  TempSrc:      SpO2: 100% 98% 100% 97%  Weight:      Height:        Intake/Output Summary (Last 24 hours) at 08/11/2019 1957 Last data filed at 08/11/2019 1634 Gross per 24 hour  Intake --  Output 350 ml  Net -350 ml   Filed Weights   08/08/19 1214 08/09/19 0500 08/10/19 0500  Weight: 93.4 kg 93.5 kg 91.4 kg   General exam: Alert, awake, oriented x 3 Respiratory system: Clear to auscultation. Respiratory effort normal. Cardiovascular system:RRR. No murmurs, rubs, gallops. Gastrointestinal system: Abdomen is nondistended, soft and nontender. No organomegaly or masses felt. Normal bowel sounds heard. Central nervous system: Alert and oriented. No focal neurological deficits. Extremities: Bilateral feet are wrapped in dressings. Skin: No rashes, lesions or ulcers Psychiatry: Judgement and insight appear normal. Mood & affect appropriate.   Data Reviewed: I have personally reviewed and interpreted daily labs, tele strips, imagings as discussed above. I reviewed all nursing notes, pharmacy notes, vitals, pertinent old records I have discussed plan of care as described above with RN and  patient/family.  CBC: Recent Labs  Lab 08/07/19 1855 08/08/19 0417 08/09/19 0448 08/10/19 0556 08/11/19 0539  WBC 12.1* 12.0* 11.0* 10.9* 11.1*  HGB 7.2* 7.3* 7.1* 8.7* 9.0*  HCT 23.5* 24.2* 22.4* 27.7* 27.6*  MCV 100.0 100.0 97.4 97.9 93.9  PLT 209 225 197 217 329   Basic Metabolic Panel: Recent Labs  Lab 08/07/19 0533 08/08/19 0417 08/09/19 0448 08/10/19 0556 08/11/19 0539  NA 139 136 135 138 140  K 4.6 4.5 4.6 4.5 4.4  CL 102 100 98 101 102  CO2 27 25 25 26 27   GLUCOSE 126* 103* 128* 105* 98  BUN 55* 51* 52* 48* 46*  CREATININE 8.06* 8.17* 8.04* 7.47* 7.47*  CALCIUM 8.3* 8.1* 7.7* 8.6* 8.8*  MG  --  2.3  --   --   --   PHOS  --  5.1* 5.4* 4.9* 4.2    Liver Function Tests: Recent Labs  Lab 08/05/19 1914 08/08/19 0417 08/09/19 0448 08/10/19 0556 08/11/19 0539  AST 20  --   --   --   --   ALT 10  --   --   --   --   ALKPHOS 92  --   --   --   --   BILITOT 0.6  --   --   --   --   PROT 5.4*  --   --   --   --   ALBUMIN 2.1* 2.2* 2.5* 2.3* 2.3*   Recent Labs  Lab 08/05/19 1914  LIPASE 22   No results for input(s): AMMONIA in the last 168 hours. Coagulation Profile: No results for input(s): INR, PROTIME in the last 168 hours. Cardiac Enzymes: No results for input(s): CKTOTAL, CKMB, CKMBINDEX, TROPONINI in the last 168 hours. BNP (last 3 results) No results for input(s): PROBNP in the last 8760 hours. CBG: Recent Labs  Lab 08/10/19 1648 08/10/19 2111 08/11/19 0746 08/11/19 1149 08/11/19 1650  GLUCAP 103* 117* 105* 102* 92   Studies: PERIPHERAL VASCULAR CATHETERIZATION  Result Date: 08/11/2019 See op note    Time spent: 35 minutes  Author: Kathie Dike, MD [facility] 08/11/2019 7:57 PM  To reach On-call, see care teams to locate the attending and reach out to them via [email or web address]. If 7PM-7AM, please contact night-coverage If you still have difficulty reaching the attending provider, please page the [facility] (Director on Call)  for [facility] on amion for assistance.

## 2019-08-11 NOTE — Progress Notes (Addendum)
Initial Nutrition Assessment  DOCUMENTATION CODES:   Not applicable  INTERVENTION:   Nepro Shake po BID, each supplement provides 425 kcal and 19 grams protein  Rena-vite daily   Vitamin C 500mg  po BID  NUTRITION DIAGNOSIS:   Increased nutrient needs related to chronic illness(ESRD on PD, wound healing) as evidenced by increased estimated needs.  GOAL:   Patient will meet greater than or equal to 90% of their needs  MONITOR:   PO intake, Supplement acceptance, Labs, Weight trends, Skin, I & O's  REASON FOR ASSESSMENT:   LOS    ASSESSMENT:   [name] with PMH significant of great left toe amputation, PAD, diabetes mellitus type 2, end-stage renal disease on peritoneal dialysis, GERD, PVD, HLD, HTN, CKD IV, and anemia of chronic disease, recent admisison for osteomyelitis of left foot now admitted with R LE pain and s/p amputation of right great toe with partial first ray resection 12/8   RD familiar with this patient from recent previous admit. Pt with fair appetite and oral intake at baseline. Pt eating anywhere from sips and bites of meals up to 100% in hospital. RD will add supplements and vitamins to help pt meet his estimated needs and support wound healing. Per chart, pt appears fairly weight stable pta.   Medications reviewed and include: aspirin, calcitriol, phoslo, plavix, darbepoetin, insulin, lactulose, protonix, miralax, senokot, vancomycin, bactrim  Labs reviewed: K 4.4 wnl, BUN 46(H), creat 7.47(H), P 4.2 wnl Hgb 9.0(L), Hct 27.6(L) cbgs- 97, 117, 103, 117, 105 x 24 hrs AIC 6.0(H)- 11/16 iPTH 127(H)- 11/20  NUTRITION - FOCUSED PHYSICAL EXAM:    Most Recent Value  Orbital Region  Mild depletion  Upper Arm Region  Mild depletion  Thoracic and Lumbar Region  Mild depletion  Buccal Region  No depletion  Temple Region  Mild depletion  Clavicle Bone Region  Mild depletion  Clavicle and Acromion Bone Region  Mild depletion  Scapular Bone Region  No  depletion  Dorsal Hand  Mild depletion  Patellar Region  Mild depletion  Anterior Thigh Region  Mild depletion  Posterior Calf Region  Mild depletion  Edema (RD Assessment)  Mild  Hair  Reviewed  Eyes  Reviewed  Mouth  Reviewed  Skin  Reviewed  Nails  Reviewed     Diet Order:   Diet Order            Diet Carb Modified Fluid consistency: Thin; Room service appropriate? Yes  Diet effective now             EDUCATION NEEDS:   Education needs have been addressed  Skin:  Skin Assessment: Reviewed RN Assessment(ecchymosis, incision R foot)  Last BM:  12/10- type 5  Height:   Ht Readings from Last 1 Encounters:  08/08/19 5\' 9"  (1.753 m)    Weight:   Wt Readings from Last 1 Encounters:  08/10/19 91.4 kg    Ideal Body Weight:  72.7 kg  BMI:  Body mass index is 29.76 kg/m.  Estimated Nutritional Needs:   Kcal:  2100-2400kcal/day  Protein:  105-120g/day  Fluid:  UOP +1L  Koleen Distance MS, RD, LDN Pager #- [phone] Office#- [phone] After Hours Pager: [phone]

## 2019-08-11 NOTE — Progress Notes (Signed)
PHARMACY CONSULT NOTE FOR:  OUTPATIENT  PARENTERAL ANTIBIOTIC THERAPY (OPAT)  Indication: Osteomyelitis of foot Regimen: Ceftazidime 1gm IV q24h, Vancomycin 1gm IV on 08/14/2019 and repeat x 1 on 08/18/2019 (also to receive PO Bactrim) End date: 08/18/2019  IV antibiotic discharge orders are pended. To discharging provider:  please sign these orders via discharge navigator,  Select New Orders & click on the button choice - Manage This Unsigned Work.     Thank you for allowing pharmacy to be a part of this patient's care.  Doreene Eland, PharmD, BCPS.   Work Cell: [phone] 08/11/2019 3:25 PM

## 2019-08-11 NOTE — Op Note (Signed)
[facility]   OPERATIVE NOTE     PROCEDURE: 1. Insertion of a single-lumen tunneled catheter. 2.  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 IV Versed plus fentanyl were utilized. Continuous ECG, pulse oximetry and blood pressure was monitored throughout the entire procedure.  Conscious sedation was for a total of 30 minutes.  ESTIMATED BLOOD LOSS: Minimal  FINDING(S): 1.  Tips of the catheter in the right atrium on fluoroscopy 2.  No obvious pneumothorax on fluoroscopy  SPECIMEN(S):  none  INDICATIONS:   Scott [name] is a 58 y.o. male  presents with end stage renal disease.  Therefore, the patient requires a tunneled dialysis catheter placement.  The patient is informed of  the risks catheter placement include but are not limited to: bleeding, infection, central venous injury, pneumothorax, possible venous stenosis, possible malpositioning in the venous system, and possible infections related to long-term catheter presence.  The patient was aware of these risks and agreed to proceed.  DESCRIPTION: The patient was taken back to Special Procedure suite.  Prior to sedation, the patient was given IV antibiotics.  After obtaining adequate sedation, the patient was prepped and draped in the standard fashion for a right IJ tunneled Hickman catheter placement.  Appropriate Time Out is called.     The the right neck and chest wall are then infiltrated with 1% Lidocaine with epinepherine.  A single-lumen Hickman catheter is then selected, opened on the back table and prepped.  Ultrasound is placed in a sterile sleeve.  Under ultrasound guidance, the right IJ vein is examined and is noted to be echolucent and easily compressible indicating patency.   An image is recorded for the permanent record.  The right IJ vein is cannulated with the microneedle under direct ultrasound vissualization.  A Microwire followed by a micro sheath is then inserted without difficulty.   A J-wire was then advanced under fluoroscopic guidance into the inferior vena cava and the wire was secured.  Small counter incision was then made at the wire insertion site. A small pocket was fashioned with blunt dissection to allow easier passage of the cuff.  The dilator and peel-away sheath are then advanced over the wire under fluoroscopic guidance. The catheter is  advanced through the peel-away sheath. It is positioned so that the tip is at the atrial caval junction.  Small incision is made at the selected exit site and the tunneling device was passed subcutaneously to the counter incision. Catheter is then pulled through the subcutaneous tunnel. The catheter is then verified for tip position under fluoroscopy.  The port was tested by aspirating and flushing.  No resistance was noted.  The port was then thoroughly flushed with heparinized saline.  The catheter was secured in placed with two interrupted stitches of 0 silk tied to the catheter.  The counter incision was closed with a U-stitch of 4-0 Monocryl.  The insertion site is then cleaned and sterile bandages applied including a Biopatch.    Sterile caps were applied to each port.  On completion fluoroscopy, the tips of the catheter were in the right atrium, and there was no evidence of pneumothorax.  COMPLICATIONS: None  CONDITION: Unchanged   Hortencia Pilar [place]  [facility] Office: [phone]   08/11/2019, 1:47 PM

## 2019-08-12 DIAGNOSIS — I70269 Atherosclerosis of native arteries of extremities with gangrene, unspecified extremity: Secondary | ICD-10-CM

## 2019-08-12 LAB — CBC
HCT: 24.3 % — ABNORMAL LOW (ref 39.0–52.0)
HCT: 26.1 % — ABNORMAL LOW (ref 39.0–52.0)
Hemoglobin: 7.4 g/dL — ABNORMAL LOW (ref 13.0–17.0)
Hemoglobin: 8.3 g/dL — ABNORMAL LOW (ref 13.0–17.0)
MCH: 30.1 pg (ref 26.0–34.0)
MCH: 30.2 pg (ref 26.0–34.0)
MCHC: 30.5 g/dL (ref 30.0–36.0)
MCHC: 31.8 g/dL (ref 30.0–36.0)
MCV: 98.8 fL (ref 80.0–100.0)
MCV: 94.9 fL (ref 80.0–100.0)
Platelets: 285 10*3/uL (ref 150–400)
Platelets: 239 10*3/uL (ref 150–400)
RBC: 2.46 MIL/uL — ABNORMAL LOW (ref 4.22–5.81)
RBC: 2.75 MIL/uL — ABNORMAL LOW (ref 4.22–5.81)
RDW: 16.1 % — ABNORMAL HIGH (ref 11.5–15.5)
RDW: 15.9 % — ABNORMAL HIGH (ref 11.5–15.5)
WBC: 11.7 10*3/uL — ABNORMAL HIGH (ref 4.0–10.5)
WBC: 11.3 10*3/uL — ABNORMAL HIGH (ref 4.0–10.5)
nRBC: 0 % (ref 0.0–0.2)
nRBC: 0 % (ref 0.0–0.2)

## 2019-08-12 LAB — GLUCOSE, CAPILLARY
Glucose-Capillary: 126 mg/dL — ABNORMAL HIGH (ref 70–99)
Glucose-Capillary: 105 mg/dL — ABNORMAL HIGH (ref 70–99)
Glucose-Capillary: 112 mg/dL — ABNORMAL HIGH (ref 70–99)
Glucose-Capillary: 103 mg/dL — ABNORMAL HIGH (ref 70–99)

## 2019-08-12 LAB — RENAL FUNCTION PANEL
Albumin: 2.2 g/dL — ABNORMAL LOW (ref 3.5–5.0)
Anion gap: 11 (ref 5–15)
BUN: 52 mg/dL — ABNORMAL HIGH (ref 6–20)
CO2: 26 mmol/L (ref 22–32)
Calcium: 8.2 mg/dL — ABNORMAL LOW (ref 8.9–10.3)
Chloride: 103 mmol/L (ref 98–111)
Creatinine, Ser: 7.94 mg/dL — ABNORMAL HIGH (ref 0.61–1.24)
GFR calc Af Amer: 8 mL/min — ABNORMAL LOW (ref >60)
GFR calc non Af Amer: 7 mL/min — ABNORMAL LOW (ref >60)
Glucose, Bld: 104 mg/dL — ABNORMAL HIGH (ref 70–99)
Phosphorus: 4.4 mg/dL (ref 2.5–4.6)
Potassium: 4.3 mmol/L (ref 3.5–5.1)
Sodium: 140 mmol/L (ref 135–145)

## 2019-08-12 MED ORDER — MIDODRINE HCL 5 MG PO TABS: 5.0000 mg | ORAL_TABLET | Freq: Three times a day (TID) | ORAL | 0 refills | Status: DC

## 2019-08-12 MED ORDER — POLYETHYLENE GLYCOL 3350 17 G PO PACK: 17.0000 g | PACK | Freq: Every day | ORAL | 0 refills | Status: DC | PRN

## 2019-08-12 MED ORDER — SULFAMETHOXAZOLE-TRIMETHOPRIM 400-80 MG PO TABS: 1.0000 | ORAL_TABLET | Freq: Every day | ORAL | 0 refills | Status: DC

## 2019-08-12 MED ORDER — VANCOMYCIN IV (FOR PTA / DISCHARGE USE ONLY): 1000.0000 mg | INTRAVENOUS | 0 refills | Status: AC

## 2019-08-12 MED ORDER — CEFTAZIDIME IV (FOR PTA / DISCHARGE USE ONLY): 1.0000 g | INTRAVENOUS | 0 refills | Status: AC

## 2019-08-12 NOTE — Discharge Summary (Signed)
Physician Discharge Summary  ZAKI Scott [name]:[id number] DOB: 1961/07/24 DOA: 07/31/2019  PCP: [facility], [place] date: 07/31/2019 Discharge date: 08/12/2019  Admitted From: Home Disposition: Home  Recommendations for Outpatient Follow-up:  1. Follow up with PCP in 1-2 weeks 2. Please obtain BMP/CBC in one week 3. Follow-up with podiatry next week to reevaluate foot wounds. 4. Follow-up with vascular surgery after 12/18 to remove Hickman's catheter once antibiotics are complete 5. Follow-up the peritoneal dialysis clinic as previously scheduled 6. Carvedilol, Imdur, hydralazine held due to borderline blood pressures.  These can be resumed as blood pressure will tolerate.  Home Health: Home health RN, PT Equipment/Devices: Wheelchair, oxygen at 2 L, intravenous antibiotics  Discharge Condition: Stable CODE STATUS: Full code Diet recommendation: Heart healthy, carb modified  Brief/Interim Summary: Scott A Herseyis an 58 y.o.malewith medical history significant ofperipheral arterial disease, diabetes mellitus type 2, ESRD on peritoneal dialysis presents to the hospital with complaints of right lower extremity pain. Patient was admitted here last week for left lower extremity pain diagnosed with osteomyelitis requiring angiogram by vascular followed by amputation of left second toe by podiatry. He was eventually discharged on IV antibiotics. He had been feeling better but started experiencing right lower extremity pain over the last couple of days. Initially pain was with exertion but now has progressed to persistent and his toes have darkened. Underwent angioplasty of right profunda femoris artery on 08/01/2019. Angioplasty of right superficial femoral and popliteal arteries as well as right peroneal artery on 08/04/2019. Nephrology was consulted to continue peritoneal dialysis here in the hospital. Podiatry has evaluated the patient and currently recommending  conservative measures but reevaluation on Monday. ID recommends continuing vancomycin plus cefepime plus Bactrim until 08/18/2019.  Currently further plan iscontinue IV antibiotics and monitor patient's progress. 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 Right lower leg gangrene due to severe peripheral vascular disease.  Recent left toe osteomyelitis.  Patient was evaluated by vascular surgery.  He underwent percutaneous angioplasty of multiple blood vessels on 12/1 and 12/4.  He was treated with intravenous heparin infusion.  Subsequently transitioned to antiplatelet therapy.  He was also seen by podiatry and is status post amputation of the right great toe.  He is to continue on intravenous antibiotics until 12/18 per infectious disease.  He is chronically been on vancomycin and ceftazidime as well as Bactrim.  Hickman's catheter was placed by vascular surgery so that he may receive antibiotics in the outpatient setting.  Once his antibiotics are complete, his catheter can be removed in follow-up with vascular surgery clinic.  He was evaluated by physical therapy and since he is minimal heel weightbearing on both of his feet he will need to be in a wheelchair until his foot wounds heal. 2. Chronic systolic congestive heart failure, hypotension.  Patient was taking multiple antihypertensives prior to admission.  During his hospital course, he did have persistent  hypotension and these medications were discontinued.  He was started on midodrine.  He is requiring supplemental oxygen due to hypoxia on room air.  Further volume management to be regulated with dialysis. 3. Anemia of chronic disease.  Hemoglobin did trend down to 7.1 postoperatively.  He was transfused 1 unit of PRBC on 12/9.  Follow-up hemoglobins remained stable. 4. End-stage renal disease on peritoneal dialysis.  Followed by nephrology. 5. Type 2 diabetes, uncontrolled with hyperglycemia associated with vascular complications.  A1c of 6.0.  He was treated with sliding scale insulin.  Resume home regimen on discharge, but we will reduce NPH dosing since his blood sugars have been stable here despite not receiving NPH. 6. Chronic systolic congestive heart failure with ejection fraction of 35 to 40% in January 2020.  Fluid management per peritoneal dialysis. 7. Recent osteomyelitis of left toe.  Continued on antibiotics (ceftazidime, vancomycin and Bactrim).  Arrangements have been made for outpatient antibiotics.  He will continue antibiotics until 12/18.  Discharge Instructions  Discharge Instructions    Diet - low sodium heart healthy   Complete by: As directed    Home infusion instructions [facility] May follow [place] Dosing Protocol; May administer Cathflo as needed to maintain patency of vascular access device.; Flushing of vascular access device: per [facility] Protocol: 0.9% NaCl pre/post medica...   Complete by: As directed    Instructions: May follow [place] Dosing Protocol   Instructions: May administer Cathflo as needed to maintain patency of vascular access device.   Instructions: Flushing of vascular access device: per [facility] Protocol: 0.9% NaCl pre/post medication administration and prn patency; Heparin 100 u/ml, 37m for implanted ports and Heparin 10u/ml, 567mfor all other central venous catheters.   Instructions: May follow [facility] Anaphylaxis Protocol for First Dose  Administration in the home: 0.9% NaCl at 25-50 ml/hr to maintain IV access for protocol meds. Epinephrine 0.3 ml IV/IM PRN and Benadryl 25-50 IV/IM PRN s/s of anaphylaxis.   Instructions: Ad[facility] Estatesnfusion Coordinator (RN) to assist per patient IV care needs in the home PRN.   Home infusion instructions [facility] May follow [place] Protocol; May administer Cathflo as needed to maintain patency of vascular access device.; Flushing of vascular access device: [facility]: 0.9% NaCl pre/post medica...   Complete by: As directed    Instructions: May follow [place] Protocol   Instructions: May administer Cathflo as needed to maintain patency of vascular access device.   Instructions: Flushing of vascular access device: per [facility]: 0.9% NaCl pre/post medication administration and prn patency; Heparin 100 u/ml, 7m57mor implanted ports and Heparin 10u/ml, 7ml77mr all other central venous catheters.   Instructions: May follow [facility] Anaphylaxis Protocol for First Dose Administration in the home: 0.9% NaCl at 25-50 ml/hr to maintain IV access for protocol meds. Epinephrine 0.3 ml IV/IM PRN and Benadryl 25-50 IV/IM PRN s/s of anaphylaxis.   Instructions: [place]usion Coordinator (RN) to assist per patient IV care needs in the home PRN.   Increase activity slowly   Complete by: As directed      Allergies as of 08/12/2019   No Known Allergies     Medication List    STOP taking these medications   carvedilol 25 MG tablet Commonly known as: COREG   feeding supplement (NEPRO CARB STEADY) Liqd   hydrALAZINE 25 MG tablet Commonly known as: APRESOLINE   isosorbide mononitrate 30 MG 24 hr tablet Commonly known as: IMDUR   nystatin 100000 UNIT/ML  suspension Commonly known as: MYCOSTATIN     TAKE these medications   albuterol 108 (90 Base) MCG/ACT inhaler Commonly known as: VENTOLIN HFA Inhale 2 puffs into the lungs every 6 (six) hours  as needed for wheezing or shortness of breath.   ascorbic acid 500 MG tablet Commonly known as: VITAMIN C Take 1 tablet (500 mg total) by mouth 2 (two) times daily.   aspirin EC 81 MG tablet Take 1 tablet (81 mg total) by mouth daily.   atorvastatin 40 MG tablet Commonly known as: LIPITOR Take 40 mg by mouth daily.   calcitRIOL 0.25 MCG capsule Commonly known as: ROCALTROL Take 0.25 mcg by mouth daily.   calcium acetate 667 MG capsule Commonly known as: PHOSLO Take 667-2,001 mg by mouth See admin instructions. Take 2,001 mg by mouth 3 times daily with meals and [phone] mg with snacks   cefTAZidime  IVPB Commonly known as: FORTAZ Inject 1 g into the vein daily for 5 days. Indication: Osteomyelitis of foot Last Day of Therapy: 08/18/2019 Labs - Once weekly:  CBC/D, CMP, ESR, CRP Start taking on: August 13, 2019 What changed: additional instructions   clopidogrel 75 MG tablet Commonly known as: PLAVIX Take 1 tablet (75 mg total) by mouth daily with breakfast.   Dialyvite 800 0.8 MG Tabs Take 1 tablet by mouth daily.   furosemide 80 MG tablet Commonly known as: LASIX Take 80 mg by mouth daily.   gentamicin cream 0.1 % Commonly known as: GARAMYCIN Apply 1 application topically daily.   insulin aspart protamine- aspart (70-30) 100 UNIT/ML injection Commonly known as: NOVOLOG MIX 70/30 Inject 0.25 mLs (25 Units total) into the skin 2 (two) times daily with a meal. What changed:   how much to take  when to take this  reasons to take this   midodrine 5 MG tablet Commonly known as: PROAMATINE Take 1 tablet (5 mg total) by mouth 3 (three) times daily with meals.   mupirocin ointment 2 % Commonly known as: BACTROBAN Place 1 application into the nose 2 (two) times daily.   nitroGLYCERIN 0.4 MG SL tablet Commonly known as: NITROSTAT Place 0.4 mg under the tongue every 5 (five) minutes as needed for chest pain. Pt needs new Rx. Bottle has expried   omeprazole 20  MG capsule Commonly known as: PRILOSEC Take 1 capsule (20 mg total) by mouth daily.   polyethylene glycol 17 g packet Commonly known as: MIRALAX / GLYCOLAX Take 17 g by mouth daily as needed.   sulfamethoxazole-trimethoprim 400-80 MG tablet Commonly known as: BACTRIM Take 1 tablet by mouth daily.   tamsulosin 0.4 MG Caps capsule Commonly known as: Flomax Take 1 capsule (0.4 mg total) by mouth daily.   traMADol 50 MG tablet Commonly known as: ULTRAM Take 50 mg by mouth every 6 (six) hours as needed for moderate pain.   vancomycin  IVPB Inject 1,000 mg into the vein as directed for 4 days. Patient to receive vancomycin 1gm on 08/14/2019 and repeat x 1 on 08/18/2019 Indication: Osteomyelitis of foot Last Day of Therapy: 08/18/2019 Labs - Once weekly:  CBC/D, CMP, ESR, CRP Start taking on: August 14, 2019 What changed:   how much to take  when to take this  additional instructions  These instructions start on August 14, 2019. If you are unsure what to do until then, ask your doctor or other care provider.   Vitamin D 50 MCG (2000 UT) tablet Take 2,000 Units by mouth daily.  Home Infusion Instuctions  (From admission, onward)         Start     Ordered   08/12/19 0000  Home infusion instructions [facility] May follow [place] Dosing Protocol; May administer Cathflo as needed to maintain patency of vascular access device.; Flushing of vascular access device: per [facility] Protocol: 0.9% NaCl pre/post medica...    Question Answer Comment  Instructions May follow [place] Dosing Protocol   Instructions May administer Cathflo as needed to maintain patency of vascular access device.   Instructions Flushing of vascular access device: per [facility] Protocol: 0.9% NaCl pre/post medication administration and prn patency; Heparin 100 u/ml, 64m for implanted ports and Heparin 10u/ml, 557mfor all other central venous catheters.   Instructions May follow [facility]  Anaphylaxis Protocol for First Dose Administration in the home: 0.9% NaCl at 25-50 ml/hr to maintain IV access for protocol meds. Epinephrine 0.3 ml IV/IM PRN and Benadryl 25-50 IV/IM PRN s/s of anaphylaxis.   Instructions [facility] Infusion Coordinator (RN) to assist per patient IV care needs in the home PRN.      08/12/19 1220   08/12/19 0000  Home infusion instructions [facility] May follow [place] Protocol; May administer Cathflo as needed to maintain patency of vascular access device.; Flushing of vascular access device: per [facility]: 0.9% NaCl pre/post medica...    Question Answer Comment  Instructions May follow [place] Protocol   Instructions May administer Cathflo as needed to maintain patency of vascular access device.   Instructions Flushing of vascular access device: per [facility]: 0.9% NaCl pre/post medication administration and prn patency; Heparin 100 u/ml, 35m36mor implanted ports and Heparin 10u/ml, 35ml58mr all other central venous catheters.   Instructions May follow [facility] Anaphylaxis Protocol for First Dose Administration in the home: 0.9% NaCl at 25-50 ml/hr to maintain IV access for protocol meds. Epinephrine 0.3 ml IV/IM PRN and Benadryl 25-50 IV/IM PRN s/s of anaphylaxis.   Instructions [facility] Infusion Coordinator (RN) to assist per patient IV care needs in the home PRN.      08/12/19 1220           Durable Medical Equipment  (From admission, onward)         Start     Ordered   08/12/19 1407  For home use only DME oxygen  Once    Question Answer Comment  Length of Need Lifetime   Mode or (Route) Nasal cannula   Liters per Minute 2   Frequency Continuous (stationary and portable oxygen unit needed)   Oxygen conserving device Yes   Oxygen delivery system Gas      08/12/19 1406   08/11/19 1504  For home use only DME standard manual wheelchair with seat cushion  Once    Comments: Patient suffers from  bilateral foot wounds which impairs their ability to perform daily activities like ambulation in the home.  A walker will not resolve issue with performing activities of daily living. A wheelchair will allow patient to safely perform daily activities. Patient can safely propel the wheelchair in the home or has a caregiver who can provide assistance. Length of need 6 months. Accessories: elevating leg rests (ELRs), wheel locks, extensions and anti-tippers.   08/11/19 1504         Follow-up Information    Schnier, GregDolores Lory Follow up on 09/11/2019.   Specialties: Vascular Surgery, Cardiology, Radiology, Vascular Surgery Why: Patient will need  ABI. Can see Arna Medici or Civil engineer, contracting;   Appt w/ Arna Medici Brown-NP@ 1:30 pm  please call office for appointment when antibiotics are complete to remove IV in neck Contact information: [place] [address] [phone]        Sharlotte Alamo, DPM Follow up.   Specialty: Podiatry Why: follow up next week as previously scheduled Contact information: [place] [address] [phone]        Murlean Iba, MD Follow up.   Specialty: Nephrology Why: follow up at peritoneal dialysis clinic as previously scheduled Contact information: [place] [address] [phone]        [employer], [employer]. Schedule an appointment as soon as possible for a visit in 2 week(s).   Contact information: [place] [address] [phone]        Kate Sable, MD .   Specialties: Cardiology, Radiology Contact information: [place] [address] [phone]          No Known Allergies  Consultations:  Podiatry  Nephrology  Vascular surgery   Procedures/Studies: PERIPHERAL VASCULAR CATHETERIZATION  Result Date: 08/11/2019 See op note  PERIPHERAL VASCULAR CATHETERIZATION  Result Date: 08/04/2019 See op note  PERIPHERAL  VASCULAR CATHETERIZATION  Result Date: 08/01/2019 See op note  PERIPHERAL VASCULAR CATHETERIZATION  Result Date: 07/18/2019 See Op Note  DG Abd Portable 1V  Result Date: 08/05/2019 CLINICAL DATA:  Abdominal pain, difficulty urinating, history chronic kidney disease, CHF, hypertension, type II diabetes mellitus, GERD EXAM: PORTABLE ABDOMEN - 1 VIEW COMPARISON:  None FINDINGS: Peritoneal dialysis catheter projects over upper pelvis. Density within urinary bladder consistent with excreted contrast material (post angiography on 08/04/2019). Increased stool throughout proximal half of colon. Small amount gas and stool within rectum. Nonobstructive bowel gas pattern without bowel dilatation or bowel wall thickening. Calcified granuloma within spleen. Lung bases clear. Bones demineralized. IMPRESSION: Increased stool throughout proximal half of colon. No acute abnormalities. Electronically Signed   By: Lavonia Dana M.D.   On: 08/05/2019 18:55   DG Foot 2 Views Left  Result Date: 07/17/2019 CLINICAL DATA:  Nonhealing wound of the left foot with perianal and drainage. History of partial amputation. EXAM: LEFT FOOT - 2 VIEW COMPARISON:  Radiographs 05/24/2019 and 05/22/2019. FINDINGS: Stable postsurgical changes following amputation through the base of the 1st metatarsal. There is new poor cortical definition of the 2nd metatarsal head. No progressive destruction of the 1st metatarsal remnant identified. There is a stable prominent plantar calcaneal spur and prominent diffuse vascular calcifications. No unexpected foreign bodies. IMPRESSION: 1. New poor cortical definition of the 2nd metatarsal head suspicious for osteomyelitis. 2. Stable postsurgical changes following amputation through the base of the 1st metatarsal. Electronically Signed   By: Richardean Sale M.D.   On: 07/17/2019 14:38       Subjective: Pain is reasonably controlled.  Denies any shortness of breath.  Wants to go home.  Discharge  Exam: Vitals:   08/12/19 1227 08/12/19 1345 08/12/19 1355 08/12/19 1536  BP: (!) 132/37   (!) 114/51  Pulse: 79   75  Resp: 19   15  Temp: 98 F (36.7 C)   98.9 F (37.2 C)  TempSrc: Oral   Oral  SpO2: 100% (!) 85% 95% 94%  Weight:      Height:        General: Pt is alert, awake, not in acute distress Cardiovascular: RRR, S1/S2 +, no rubs, no gallops  Respiratory: CTA bilaterally, no wheezing, no rhonchi Abdominal: Soft, NT, ND, bowel sounds + Extremities: Bilateral lower extremities wrapped in Ace bandages.    The results of significant diagnostics from this hospitalization (including imaging, microbiology, ancillary and laboratory) are listed below for reference.     Microbiology: Recent Results (from the past 240 hour(s))  CULTURE, BLOOD (ROUTINE X 2) w Reflex to ID Panel     Status: None   Collection Time: 08/06/19 10:37 AM   Specimen: BLOOD  Result Value Ref Range Status   Specimen Description BLOOD LAC  Final   Special Requests   Final    BOTTLES DRAWN AEROBIC AND ANAEROBIC Blood Culture adequate volume   Culture   Final    NO GROWTH 5 DAYS Performed at [facility], [address] [place], [place] [address]    Report Status 08/11/2019 FINAL  Final  CULTURE, BLOOD (ROUTINE X 2) w Reflex to ID Panel     Status: None   Collection Time: 08/06/19 10:37 AM   Specimen: BLOOD  Result Value Ref Range Status   Specimen Description BLOOD LEFT FOREARM  Final   Special Requests   Final    BOTTLES DRAWN AEROBIC AND ANAEROBIC Blood Culture results may not be optimal due to an inadequate volume of blood received in culture bottles   Culture   Final    NO GROWTH 5 DAYS Performed at [facility], [address] [place], [place] [address]    Report Status 08/11/2019 FINAL  Final  Body fluid culture     Status: None   Collection Time: 08/07/19  9:00 AM   Specimen: Peritoneal Washings; Peritoneal Fluid  Result Value Ref Range Status   Specimen  Description   Final    PERITONEAL Performed at [facility], [address] [place], [place] [address]    Special Requests   Final    Normal Performed at [facility], [place], [place] [address]    Gram Stain   Final    RARE WBC PRESENT,BOTH PMN AND MONONUCLEAR NO ORGANISMS SEEN    Culture   Final    NO GROWTH 3 DAYS Performed at [facility], [place] [address], [place],  [address]    Report Status 08/11/2019 FINAL  Final  Respiratory Panel by RT PCR (Flu A&B, Covid) - Nasopharyngeal Swab     Status: None   Collection Time: 08/08/19 10:53 AM   Specimen: Nasopharyngeal Swab  Result Value Ref Range Status   SARS Coronavirus 2 by RT PCR NEGATIVE NEGATIVE Final    Comment: (NOTE) SARS-CoV-2 target nucleic acids are NOT DETECTED. The SARS-CoV-2 RNA is generally detectable in upper respiratoy specimens during the acute phase of infection. The lowest concentration of SARS-CoV-2 viral copies this assay can detect is 131 copies/mL. A negative result does not preclude SARS-Cov-2 infection and should not be used as the sole basis for treatment or other patient management decisions. A negative result may occur with  improper specimen collection/handling, submission of specimen other than nasopharyngeal swab, presence of viral mutation(s) within the areas targeted by this assay, and inadequate number of viral copies (<131 copies/mL). A negative result must be combined with clinical observations, patient history, and epidemiological information. The expected result is Negative. Fact Sheet for Patients:  PinkCheek.be Fact Sheet for Healthcare Providers:  GravelBags.it This test is not yet ap proved or cleared by the [place] FDA and  has been authorized for detection and/or diagnosis  of SARS-CoV-2 by FDA under an Emergency Use Authorization (EUA). This EUA will remain  in effect  (meaning this test can be used) for the duration of the COVID-19 declaration under Section 564(b)(1) of the Act, 21 U.S.C. section 360bbb-3(b)(1), unless the authorization is terminated or revoked sooner.    Influenza A by PCR NEGATIVE NEGATIVE Final   Influenza B by PCR NEGATIVE NEGATIVE Final    Comment: (NOTE) The Xpert Xpress SARS-CoV-2/FLU/RSV assay is intended as an aid in  the diagnosis of influenza from Nasopharyngeal swab specimens and  should not be used as a sole basis for treatment. Nasal washings and  aspirates are unacceptable for Xpert Xpress SARS-CoV-2/FLU/RSV  testing. Fact Sheet for Patients: PinkCheek.be Fact Sheet for Healthcare Providers: GravelBags.it This test is not yet approved or cleared by the [place] FDA and  has been authorized for detection and/or diagnosis of SARS-CoV-2 by  FDA under an Emergency Use Authorization (EUA). This EUA will remain  in effect (meaning this test can be used) for the duration of the  Covid-19 declaration under Section 564(b)(1) of the Act, 21  U.S.C. section 360bbb-3(b)(1), unless the authorization is  terminated or revoked. Performed at [facility], [address] [place], [place] [address]      Labs: BNP (last 3 results) Recent Labs    09/17/18 2156  BNP 536.1*   Basic Metabolic Panel: Recent Labs  Lab 08/08/19 0417 08/09/19 0448 08/10/19 0556 08/11/19 0539 08/12/19 0423  NA 136 135 138 140 140  K 4.5 4.6 4.5 4.4 4.3  CL 100 98 101 102 103  CO2 _0 GLUCOSE 103* 128* 105* 98 104*  BUN 51* 52* 48* 46* 52*  CREATININE 8.17* 8.04* 7.47* 7.47* 7.94*  CALCIUM 8.1* 7.7* 8.6* 8.8* 8.2*  MG 2.3  --   --   --   --   PHOS 5.1* 5.4* 4.9* 4.2 4.4   Liver Function Tests: Recent Labs  Lab 08/08/19 0417 08/09/19 0448 08/10/19 0556 08/11/19 0539 08/12/19 0423  ALBUMIN 2.2* 2.5* 2.3* 2.3* 2.2*   No results for input(s): LIPASE,  AMYLASE in the last 168 hours. No results for input(s): AMMONIA in the last 168 hours. CBC: Recent Labs  Lab 08/09/19 0448 08/10/19 0556 08/11/19 0539 08/12/19 0423 08/12/19 1025  WBC 11.0* 10.9* 11.1* 11.7* 11.3*  HGB 7.1* 8.7* 9.0* 7.4* 8.3*  HCT 22.4* 27.7* 27.6* 24.3* 26.1*  MCV 97.4 97.9 93.9 98.8 94.9  PLT 197 217 263 285 239   Cardiac Enzymes: No results for input(s): CKTOTAL, CKMB, CKMBINDEX, TROPONINI in the last 168 hours. BNP: Invalid input(s): POCBNP CBG: Recent Labs  Lab 08/11/19 1650 08/11/19 2058 08/12/19 0800 08/12/19 1207 08/12/19 1643  GLUCAP 92 121* 126* 105* 112*   D-Dimer No results for input(s): DDIMER in the last 72 hours. Hgb A1c No results for input(s): HGBA1C in the last 72 hours. Lipid Profile No results for input(s): CHOL, HDL, LDLCALC, TRIG, CHOLHDL, LDLDIRECT in the last 72 hours. Thyroid function studies No results for input(s): TSH, T4TOTAL, T3FREE, THYROIDAB in the last 72 hours.  Invalid input(s): FREET3 Anemia work up No results for input(s): VITAMINB12, FOLATE, FERRITIN, TIBC, IRON, RETICCTPCT in the last 72 hours. Urinalysis    Component Value Date/Time   COLORURINE YELLOW 06/19/2019 1202   APPEARANCEUR CLEAR 06/19/2019 1202   APPEARANCEUR Clear 12/03/2014 0751   LABSPEC 1.020 06/19/2019 1202   LABSPEC 1.011 12/03/2014 0751   PHURINE 6.0 06/19/2019 1202   GLUCOSEU  100 (A) 06/19/2019 1202   GLUCOSEU 50 mg/dL 12/03/2014 0751   HGBUR MODERATE (A) 06/19/2019 1202   BILIRUBINUR NEGATIVE 06/19/2019 1202   BILIRUBINUR Negative 12/03/2014 0751   KETONESUR NEGATIVE 06/19/2019 1202   PROTEINUR 100 (A) 06/19/2019 1202   NITRITE NEGATIVE 06/19/2019 1202   LEUKOCYTESUR NEGATIVE 06/19/2019 1202   LEUKOCYTESUR Negative 12/03/2014 0751   Sepsis Labs Invalid input(s): PROCALCITONIN,  WBC,  LACTICIDVEN Microbiology Recent Results (from the past 240 hour(s))  CULTURE, BLOOD (ROUTINE X 2) w Reflex to ID Panel     Status: None    Collection Time: 08/06/19 10:37 AM   Specimen: BLOOD  Result Value Ref Range Status   Specimen Description BLOOD LAC  Final   Special Requests   Final    BOTTLES DRAWN AEROBIC AND ANAEROBIC Blood Culture adequate volume   Culture   Final    NO GROWTH 5 DAYS Performed at [facility], [address] [place], [place] [address]    Report Status 08/11/2019 FINAL  Final  CULTURE, BLOOD (ROUTINE X 2) w Reflex to ID Panel     Status: None   Collection Time: 08/06/19 10:37 AM   Specimen: BLOOD  Result Value Ref Range Status   Specimen Description BLOOD LEFT FOREARM  Final   Special Requests   Final    BOTTLES DRAWN AEROBIC AND ANAEROBIC Blood Culture results may not be optimal due to an inadequate volume of blood received in culture bottles   Culture   Final    NO GROWTH 5 DAYS Performed at [facility], [address] [place], [place] [address]    Report Status 08/11/2019 FINAL  Final  Body fluid culture     Status: None   Collection Time: 08/07/19  9:00 AM   Specimen: Peritoneal Washings; Peritoneal Fluid  Result Value Ref Range Status   Specimen Description   Final    PERITONEAL Performed at [facility], [address] [place], [place] [address]    Special Requests   Final    Normal Performed at [facility], [place], [place] [address]    Gram Stain   Final    RARE WBC PRESENT,BOTH PMN AND MONONUCLEAR NO ORGANISMS SEEN    Culture   Final    NO GROWTH 3 DAYS Performed at [facility], [place] [address], [place],  [address]    Report Status 08/11/2019 FINAL  Final  Respiratory Panel by RT PCR (Flu A&B, Covid) - Nasopharyngeal Swab     Status: None   Collection Time: 08/08/19 10:53 AM   Specimen: Nasopharyngeal Swab  Result Value Ref Range Status   SARS Coronavirus 2 by RT PCR NEGATIVE NEGATIVE Final    Comment: (NOTE) SARS-CoV-2 target nucleic acids are NOT DETECTED. The SARS-CoV-2 RNA is generally  detectable in upper respiratoy specimens during the acute phase of infection. The lowest concentration of SARS-CoV-2 viral copies this assay can detect is 131 copies/mL. A negative result does not preclude SARS-Cov-2 infection and should not be used as the sole basis for treatment or other patient management decisions. A negative result may occur with  improper specimen collection/handling, submission of specimen other than nasopharyngeal swab, presence of viral mutation(s) within the areas targeted by this assay, and inadequate number of viral copies (<131 copies/mL). A negative result must be combined with clinical observations, patient history, and epidemiological information. The expected result is Negative. Fact Sheet for Patients:  PinkCheek.be Fact Sheet for Healthcare Providers:  GravelBags.it This test is not yet ap proved or cleared by the [place] and  has been authorized for detection and/or diagnosis of SARS-CoV-2 by FDA under an Emergency Use Authorization (EUA). This EUA will remain  in effect (meaning this test can be used) for the duration of the COVID-19 declaration under Section 564(b)(1) of the Act, 21 U.S.C. section 360bbb-3(b)(1), unless the authorization is terminated or revoked sooner.    Influenza A by PCR NEGATIVE NEGATIVE Final   Influenza B by PCR NEGATIVE NEGATIVE Final    Comment: (NOTE) The Xpert Xpress SARS-CoV-2/FLU/RSV assay is intended as an aid in  the diagnosis of influenza from Nasopharyngeal swab specimens and  should not be used as a sole basis for treatment. Nasal washings and  aspirates are unacceptable for Xpert Xpress SARS-CoV-2/FLU/RSV  testing. Fact Sheet for Patients: PinkCheek.be Fact Sheet for Healthcare Providers: GravelBags.it This test is not yet approved or cleared by the [place] FDA and  has been  authorized for detection and/or diagnosis of SARS-CoV-2 by  FDA under an Emergency Use Authorization (EUA). This EUA will remain  in effect (meaning this test can be used) for the duration of the  Covid-19 declaration under Section 564(b)(1) of the Act, 21  U.S.C. section 360bbb-3(b)(1), unless the authorization is  terminated or revoked. Performed at [facility], [address] [place], [place] [address]      Time coordinating discharge: 6mns  SIGNED:   JKathie Dike MD  [facility] 08/12/2019, 7:46 PM   If 7PM-7AM, please contact night-coverage [email or web address]

## 2019-08-12 NOTE — Progress Notes (Signed)
Discharge instructions given to pt. All questions answered. Oxygen tank has been delivered to the hospital. Waiting on pt family to arive.

## 2019-08-12 NOTE — TOC Transition Note (Signed)
Transition of Care [facility]) - CM/SW Discharge Note   Patient Details  Name: JOVIAN LEMBCKE MRN: [id number] Date of Birth: 05/16/1961  Transition of Care [facility]) CM/SW Contact:  Marshell Garfinkel, RN Phone Number: 08/12/2019, 2:22 PM   Clinical Narrative:     O2 ordered through [facility] to be delivered to this room prior to discharge.  Final next level of care: [place] Barriers to Discharge: No Barriers Identified   Patient Goals and CMS Choice Patient states their goals for this hospitalization and ongoing recovery are:: To return home with home health CMS Medicare.gov Compare Post Acute Care list provided to:: Patient Choice offered to / list presented to : Patient  Discharge Placement                       Discharge Plan and Services In-house Referral: Clinical Social Work   Post Acute Care Choice: Home Health          DME Arranged: Oxygen DME Agency: [facility] Date DME Agency Contacted: 08/12/19 Time DME Agency Contacted: [phone] Representative spoke with at DME Agency: [place]: RN, PT [place] Agency: [place] ([place]) Date [place]: 08/12/19 Time [place]: 1324 Representative spoke with at [place]: [place] ([place]) Interventions     Readmission Risk Interventions Readmission Risk Prevention Plan 08/04/2019 07/19/2019 07/19/2019  Transportation Screening Complete Complete Complete  Medication Review Press photographer) Referral to Pharmacy Complete Complete  [place] or Home Care Consult Complete Complete Complete  SW Recovery Care/Counseling Consult Complete - -  Palliative Care Screening Not Applicable Not Applicable -  [place] Not Applicable Not Applicable -  Some recent data might be hidden

## 2019-08-12 NOTE — Progress Notes (Signed)
O2 has still not been delivered by [facility]. Agency called ([phone]) and stated they would call me back with an ETA. Wife Kathleen Argue updated on plan of care.

## 2019-08-12 NOTE — Progress Notes (Signed)
Pd completed 

## 2019-08-12 NOTE — Progress Notes (Signed)
Patient requiring O2 2L while at rest or ambulating. MD text paged to notify. Case worker Levada Dy made aware for discharge planning.

## 2019-08-12 NOTE — Progress Notes (Signed)
Pt discharged home via personal vehicle. All questions answered. Pt refusing to wear home oxygen. Oxygen was 94% on room air.

## 2019-08-12 NOTE — Progress Notes (Signed)
Pt requesting Iv to be removed. Pt is getting dressed [facility] stating that they will be here to deliver oxygen around 9-9:30pm

## 2019-08-12 NOTE — Progress Notes (Addendum)
SATURATION QUALIFICATIONS: (This note is used to comply with regulatory documentation for home oxygen)  Patient Saturations on Room Air at Rest = 85%  Patient Saturations on 1 Liters of oxygen at rest = 95%

## 2019-08-12 NOTE — Progress Notes (Signed)
SATURATION QUALIFICATIONS: (This note is used to comply with regulatory documentation for home oxygen)  Patient Saturations on Room Air at Rest = 85%   Patient Saturations on 1 Liters of oxygen = 95%

## 2019-08-12 NOTE — TOC Transition Note (Signed)
Transition of Care [facility]) - CM/SW Discharge Note   Patient Details  Name: Scott [name] MRN: [id number] Date of Birth: 1961-07-30  Transition of Care [facility]) CM/SW Contact:  Marshell Garfinkel, RN Phone Number: 08/12/2019, 1:24 PM   Clinical Narrative:     Jeannene Patella with [facility] and Corene Cornea with [facility] aware of patient discharge to home today. No other RNCM Needs.   Final next level of care: [place] Barriers to Discharge: No Barriers Identified   Patient Goals and CMS Choice Patient states their goals for this hospitalization and ongoing recovery are:: To return home with home health CMS Medicare.gov Compare Post Acute Care list provided to:: Patient Choice offered to / list presented to : Patient  Discharge Placement                       Discharge Plan and Services In-house Referral: Clinical Social Work   Post Acute Care Choice: Home Health          DME Arranged: N/A DME Agency: NA       HH Arranged: RN, PT [place] Agency: [place] ([facility]) Date [place]: 08/12/19 Time [place]: [phone] Representative spoke with at [place]: [place] ([place]) Interventions     Readmission Risk Interventions Readmission Risk Prevention Plan 08/04/2019 07/19/2019 07/19/2019  Transportation Screening Complete Complete Complete  Medication Review Press photographer) Referral to Pharmacy Complete Complete  [place] or Home Care Consult Complete Complete Complete  SW Recovery Care/Counseling Consult Complete - -  Palliative Care Screening Not Applicable Not Applicable -  [place] Not Applicable Not Applicable -  Some recent data might be hidden

## 2019-08-12 NOTE — Evaluation (Signed)
Physical Therapy Evaluation Patient Details Name: Scott [name] MRN: [id number] DOB: Mar 14, 1961 Today's Date: 08/12/2019   History of Present Illness  Scott [name] is a 3yoM who comes to [facility] ED after severe pain in Right foot. Pt scheduled to have RLE vascular surgery in a couple days, but his pain has been recently very intense. Pt was admitted recently with Left foot toe amputations on 11/18. Pt underwent Rt leg stent placement on 12/1. Pt also sustained a fall to floor while donning pants from hospital bed, all without injury, recanalization RLE for limb salvage 12/4. 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 PMH: CHF, CKD, CAD, PVD, HTN, DM, ESRD on PD overnight.  Clinical Impression  Pt admitted with above diagnosis. Pt currently with functional limitations due to the deficits listed below (see "PT Problem List"). Upon entry, pt in bed, awake and agreeable to participate. Noted spilled beverage on tray and belonging, author cleaned and dried pt's phone, room phone. Pt also soiled in urine, unaware until brought to his attention, pt assisted with gown change. The pt is alert and oriented x4, pleasant, conversational, and generally a good historian. Received on 2L/O2 at 100%, but trialed on room, pt continues to remain 85-87% regardless of position or activity. Pt left on 1L/min at end of session at 95%. Pt has no prior need for supplemental O2, but has required it for several days now. Functional mobility assessment demonstrates increased effort/time requirements, poor tolerance, and need for physical assistance, whereas the patient performed these at a higher level of independence PTA. Mod-maxA to rise from sitting, but is able to do better with elevated surface. Biggest challenge with transfers is maintaining balance in bialt orthowedge shoes. minA for pivot to/from W/C d/tintermittent LOB. Empirically, the patient demonstrates increased risk of recurrent falls  AEB gait speed <0.38m/s, forward reach <5", and multiple LOB demonstrated throughout session. Pt continues to present with drowsiness, delayed processing compared to earlier this admission and previous admission per experience of author. Pt will benefit from skilled PT intervention to increase independence and safety with basic mobility in preparation for discharge to the venue listed below.       Follow Up Recommendations Home health PT;Supervision for mobility/OOB;Supervision - Intermittent    Equipment Recommendations  None recommended by PT    Recommendations for Other Services       Precautions / Restrictions Precautions Precautions: Fall Restrictions Weight Bearing Restrictions: Yes RLE Weight Bearing: Non weight bearing LLE Weight Bearing: Non weight bearing Other Position/Activity Restrictions: Podiatry allowing for heel weightbearing as needed for transfers (in ortho wedge shoes)      Mobility  Bed Mobility Overal bed mobility: Needs Assistance Bed Mobility: Supine to Sit;Sit to Supine     Supine to sit: Modified independent (Device/Increase time) Sit to supine: Modified independent (Device/Increase time)   General bed mobility comments: trunk strength slightly improved compared to last week.  Transfers Overall transfer level: Needs assistance Equipment used: Rolling walker (2 wheeled);Pushed w/c Transfers: Sit to/from [facility] Sit to Stand: Max assist(Able to perform with Min-modA from elevated surface; supervision from VERY elevated surface.) Stand pivot transfers: Min assist(mostly for safety cues and 1-3 LOB during pivot. orthowedge shoes cause some balance difficulty)          Ambulation/Gait Ambulation/Gait assistance: (deferred as patient is NWB bilat)              Stairs  Wheelchair Mobility    Modified Rankin (Stroke Patients Only)       Balance Overall balance assessment: Needs  assistance Sitting-balance support: No upper extremity supported;Feet supported Sitting balance-Leahy Scale: Good Sitting balance - Comments: steady sitting EOB   Standing balance support: Bilateral upper extremity supported;During functional activity Standing balance-Leahy Scale: Poor Standing balance comment: several LOB in wedge surgical shoes, min-modA for correction.                             Pertinent Vitals/Pain Pain Assessment: 0-10 Pain Score: 5  Pain Location: Right antebrachium Pain Descriptors / Indicators: Aching;Sore Pain Intervention(s): Limited activity within patient's tolerance;Monitored during session;Premedicated before session;Repositioned    Home Living Family/patient expects to be discharged to:: Private residence Living Arrangements: Spouse/significant other;Children Available Help at Discharge: Family;Available 24 hours/day Type of Home: Mobile home Home Access: Stairs to enter;Ramped entrance Entrance Stairs-Rails: Can reach both Entrance Stairs-Number of Steps: 3-4 Home Layout: One level Home Equipment: Grab bars - tub/shower;Cane - single point;Wheelchair - [employer] - 2 wheels Additional Comments: None    Prior Function Level of Independence: Needs assistance         Comments: pt states  multiple falls in the last year with 1-2 the last 6 months, pt thinks cause of falls was tripping over something     Hand Dominance        Extremity/Trunk Assessment   Upper Extremity Assessment Upper Extremity Assessment: Generalized weakness;Overall [facility] for tasks assessed    Lower Extremity Assessment Lower Extremity Assessment: Generalized weakness;Overall [facility] for tasks assessed       Communication   Communication: No difficulties  Cognition Arousal/Alertness: Lethargic;Suspect due to medications Behavior During Therapy: [facility] for tasks assessed/performed Overall Cognitive Status: Within Functional Limits for tasks assessed                                         General Comments      Exercises     Assessment/Plan    PT Assessment Patient needs continued PT services  PT Problem List Decreased strength;Decreased activity tolerance;Decreased mobility;Decreased balance;Cardiopulmonary status limiting activity;Decreased safety awareness;Decreased knowledge of use of DME       PT Treatment Interventions DME instruction;Gait training;Balance training;Functional mobility training;Therapeutic activities;Therapeutic exercise;Stair training;Patient/family education    PT Goals (Current goals can be found in the Care Plan section)  Acute Rehab PT Goals Patient Stated Goal: return to home and be in a familiar comfortable environment PT Goal Formulation: With patient Time For Goal Achievement: 08/16/19 Potential to Achieve Goals: Good    Frequency 7X/week   Barriers to discharge        Co-evaluation               AM-PAC PT "6 Clicks" Mobility  Outcome Measure Help needed turning from your back to your side while in a flat bed without using bedrails?: None Help needed moving from lying on your back to sitting on the side of a flat bed without using bedrails?: None Help needed moving to and from a bed to a chair (including a wheelchair)?: A Lot Help needed standing up from a chair using your arms (e.g., wheelchair or bedside chair)?: A Little Help needed to walk in hospital room?: Total Help needed climbing 3-5 steps with a railing? : Total 6 Click Score: 15  End of Session Equipment Utilized During Treatment: Oxygen;Gait belt Activity Tolerance: Patient limited by lethargy;No increased pain;Patient tolerated treatment well Patient left: in bed;with call bell/phone within reach;with bed alarm set Nurse Communication: Mobility status PT Visit Diagnosis: Unsteadiness on feet (R26.81);Muscle weakness (generalized) (M62.81);Difficulty in walking, not elsewhere classified (R26.2);Other  abnormalities of gait and mobility (R26.89)    Time: 3606-7703 PT Time Calculation (min) (ACUTE ONLY): 26 min   Charges:     PT Treatments $Therapeutic Exercise: 8-22 mins        2:20 PM, 08/12/19 Etta Grandchild, PT, DPT Physical Therapist - [facility]  [phone] ([place])    Dontavian Marchi C 08/12/2019, 2:13 PM

## 2019-08-12 NOTE — Progress Notes (Signed)
[facility]  ROUNDING NOTE   Subjective:   Peritoneal dialysis treatment last night. Tolerated treatment well.  Eating some.  Denies nausea or vomiting No SOB Asking about going home today  .   Objective:  Vital signs in last 24 hours:  Temp:  [98 F (36.7 C)-98.4 F (36.9 C)] 98 F (36.7 C) (12/12 1227) Pulse Rate:  [68-94] 79 (12/12 1227) Resp:  [15-22] 19 (12/12 1227) BP: (98-135)/(31-67) 132/37 (12/12 1227) SpO2:  [84 %-100 %] 100 % (12/12 1227)  Weight change:  Filed Weights   08/08/19 1214 08/09/19 0500 08/10/19 0500  Weight: 93.4 kg 93.5 kg 91.4 kg    Intake/Output: I/O last 3 completed shifts: In: -  Out: 1251 [Urine:550; Other:701]   Intake/Output this shift:  Total I/O In: 240 [P.O.:240] Out: 100 [Urine:100]  Physical Exam: General: NAD   Head: Normocephalic, atraumatic. Moist oral mucosal membranes  Eyes: Anicteric,    Lungs:  Clear to auscultation  Heart: Regular rate and rhythm  Abdomen:  Soft, nontender,   Extremities: B/l feet bandaged  Neurologic: Alert and oriented  Access: PD catheter    Basic Metabolic Panel: Recent Labs  Lab 08/08/19 0417 08/09/19 0448 08/10/19 0556 08/11/19 0539 08/12/19 0423  NA 136 135 138 140 140  K 4.5 4.6 4.5 4.4 4.3  CL 100 98 101 102 103  CO2 25 25 26 27 26   GLUCOSE 103* 128* 105* 98 104*  BUN 51* 52* 48* 46* 52*  CREATININE 8.17* 8.04* 7.47* 7.47* 7.94*  CALCIUM 8.1* 7.7* 8.6* 8.8* 8.2*  MG 2.3  --   --   --   --   PHOS 5.1* 5.4* 4.9* 4.2 4.4    Liver Function Tests: Recent Labs  Lab 08/05/19 1914 08/08/19 0417 08/09/19 0448 08/10/19 0556 08/11/19 0539 08/12/19 0423  AST 20  --   --   --   --   --   ALT 10  --   --   --   --   --   ALKPHOS 92  --   --   --   --   --   BILITOT 0.6  --   --   --   --   --   PROT 5.4*  --   --   --   --   --   ALBUMIN 2.1* 2.2* 2.5* 2.3* 2.3* 2.2*   Recent Labs  Lab 08/05/19 1914  LIPASE 22   No results for input(s): AMMONIA in the last  168 hours.  CBC: Recent Labs  Lab 08/09/19 0448 08/10/19 0556 08/11/19 0539 08/12/19 0423 08/12/19 1025  WBC 11.0* 10.9* 11.1* 11.7* 11.3*  HGB 7.1* 8.7* 9.0* 7.4* 8.3*  HCT 22.4* 27.7* 27.6* 24.3* 26.1*  MCV 97.4 97.9 93.9 98.8 94.9  PLT 197 217 263 285 239    Cardiac Enzymes: No results for input(s): CKTOTAL, CKMB, CKMBINDEX, TROPONINI in the last 168 hours.  BNP: Invalid input(s): POCBNP  CBG: Recent Labs  Lab 08/11/19 1149 08/11/19 1650 08/11/19 2058 08/12/19 0800 08/12/19 1207  GLUCAP 102* 92 121* 126* 105*    Microbiology: Results for orders placed or performed during the hospital encounter of 07/31/19  CULTURE, BLOOD (ROUTINE X 2) w Reflex to ID Panel     Status: None   Collection Time: 08/06/19 10:37 AM   Specimen: BLOOD  Result Value Ref Range Status   Specimen Description BLOOD LAC  Final   Special Requests   Final    BOTTLES  DRAWN AEROBIC AND ANAEROBIC Blood Culture adequate volume   Culture   Final    NO GROWTH 5 DAYS Performed at [facility], [address] [place], [place] [address]    Report Status 08/11/2019 FINAL  Final  CULTURE, BLOOD (ROUTINE X 2) w Reflex to ID Panel     Status: None   Collection Time: 08/06/19 10:37 AM   Specimen: BLOOD  Result Value Ref Range Status   Specimen Description BLOOD LEFT FOREARM  Final   Special Requests   Final    BOTTLES DRAWN AEROBIC AND ANAEROBIC Blood Culture results may not be optimal due to an inadequate volume of blood received in culture bottles   Culture   Final    NO GROWTH 5 DAYS Performed at [facility], [address] [place], [place] [address]    Report Status 08/11/2019 FINAL  Final  Body fluid culture     Status: None   Collection Time: 08/07/19  9:00 AM   Specimen: Peritoneal Washings; Peritoneal Fluid  Result Value Ref Range Status   Specimen Description   Final    PERITONEAL Performed at [facility], [address] [place], [place] [address]     Special Requests   Final    Normal Performed at [facility], [place], [place] [address]    Gram Stain   Final    RARE WBC PRESENT,BOTH PMN AND MONONUCLEAR NO ORGANISMS SEEN    Culture   Final    NO GROWTH 3 DAYS Performed at [facility], [place] [address], [place], [place] [address]    Report Status 08/11/2019 FINAL  Final  Respiratory Panel by RT PCR (Flu A&B, Covid) - Nasopharyngeal Swab     Status: None   Collection Time: 08/08/19 10:53 AM   Specimen: Nasopharyngeal Swab  Result Value Ref Range Status   SARS Coronavirus 2 by RT PCR NEGATIVE NEGATIVE Final    Comment: (NOTE) SARS-CoV-2 target nucleic acids are NOT DETECTED. The SARS-CoV-2 RNA is generally detectable in upper respiratoy specimens during the acute phase of infection. The lowest concentration of SARS-CoV-2 viral copies this assay can detect is 131 copies/mL. A negative result does not preclude SARS-Cov-2 infection and should not be used as the sole basis for treatment or other patient management decisions. A negative result may occur with  improper specimen collection/handling, submission of specimen other than nasopharyngeal swab, presence of viral mutation(s) within the areas targeted by this assay, and inadequate number of viral copies (<131 copies/mL). A negative result must be combined with clinical observations, patient history, and epidemiological information. The expected result is Negative. Fact Sheet for Patients:  PinkCheek.be Fact Sheet for Healthcare Providers:  GravelBags.it This test is not yet ap proved or cleared by the [place] FDA and  has been authorized for detection and/or diagnosis of SARS-CoV-2 by FDA under an Emergency Use Authorization (EUA). This EUA will remain  in effect (meaning this test can be used) for the duration of the COVID-19 declaration under Section 564(b)(1) of the Act, 21  U.S.C. section 360bbb-3(b)(1), unless the authorization is terminated or revoked sooner.    Influenza A by PCR NEGATIVE NEGATIVE Final   Influenza B by PCR NEGATIVE NEGATIVE Final    Comment: (NOTE) The Xpert Xpress SARS-CoV-2/FLU/RSV assay is intended as an aid in  the diagnosis of influenza from Nasopharyngeal swab specimens and  should not be used as a sole basis for treatment. Nasal washings and  aspirates are unacceptable for Xpert Xpress SARS-CoV-2/FLU/RSV  testing. Fact Sheet for Patients: PinkCheek.be Fact Sheet for Healthcare Providers: GravelBags.it This test is not yet approved or cleared by the [place] FDA and  has been authorized for detection and/or diagnosis of SARS-CoV-2 by  FDA under an Emergency Use Authorization (EUA). This EUA will remain  in effect (meaning this test can be used) for the duration of the  Covid-19 declaration under Section 564(b)(1) of the Act, 21  U.S.C. section 360bbb-3(b)(1), unless the authorization is  terminated or revoked. Performed at [facility], [address] [place],  [address]     Coagulation Studies: No results for input(s): LABPROT, INR in the last 72 hours.  Urinalysis: No results for input(s): COLORURINE, LABSPEC, PHURINE, GLUCOSEU, HGBUR, BILIRUBINUR, KETONESUR, PROTEINUR, UROBILINOGEN, NITRITE, LEUKOCYTESUR in the last 72 hours.  Invalid input(s): APPERANCEUR    Imaging: PERIPHERAL VASCULAR CATHETERIZATION  Result Date: 08/11/2019 See op note    Medications:   . cefTAZidime (FORTAZ)  IV 1 g (08/11/19 1634)  . cefTAZidime (FORTAZ)  IV 1 g (08/12/19 0821)  . dialysis solution 1.5% low-MG/low-CA Stopped (08/11/19 2239)   . aspirin EC  81 mg Oral Daily  . atorvastatin  40 mg Oral Daily  . calcitRIOL  0.25 mcg Oral Daily  . calcium acetate  2,001 mg Oral TID WC  . Chlorhexidine Gluconate Cloth  6 each Topical Daily  . clopidogrel   75 mg Oral Q breakfast  . darbepoetin (ARANESP) injection - NON-DIALYSIS  40 mcg Subcutaneous Q Sat-1800  . feeding supplement (NEPRO CARB STEADY)  237 mL Oral BID BM  . gentamicin cream  1 application Topical Daily  . heparin injection (subcutaneous)  5,000 Units Subcutaneous Q8H  . insulin aspart  0-5 Units Subcutaneous QHS  . insulin aspart  0-9 Units Subcutaneous TID WC  . insulin aspart  2 Units Subcutaneous TID WC  . lactulose  20 g Oral Daily  . midodrine  5 mg Oral TID WC  . multivitamin  1 tablet Oral QHS  . pantoprazole  40 mg Oral Daily  . polyethylene glycol  17 g Oral BID  . senna-docusate  2 tablet Oral BID  . sulfamethoxazole-trimethoprim  1 tablet Oral Daily  . vancomycin variable dose per unstable renal function (pharmacist dosing)   Does not apply See admin instructions  . vitamin C  500 mg Oral BID   acetaminophen **OR** acetaminophen, albuterol, bisacodyl, calcium acetate, HYDROcodone-acetaminophen, HYDROmorphone (DILAUDID) injection, methocarbamol, ondansetron **OR** ondansetron (ZOFRAN) IV, ondansetron (ZOFRAN) IV, oxyCODONE-acetaminophen  Assessment/ Plan:  Scott [name] is a 58 y.o. white male with end stage renal disease on peritoneal dialysis, hypertension, diabetes mellitus type II, peripheral vascular disease, left toe amputation, who is admitted to [facility] on 07/31/2019 for Right leg pain [M79.604] Dry gangrene ([place]) [I96] Ischemic leg pain [M79.606, I99.8]  [facility] CAPD 4 exchanges 2032mL fills  1. End Stage Renal Disease: continue peritoneal dialysis.  CCPD 10 hours 5 exchanges 2 liter fills - 1.5% dextrose - continue midodrine for low BP,    2. Peripheral vascular disease with osteomyelitis, grangrene S/p Amputation right great toe with partial first ray resection on 12/8, Dr Cleda Mccreedy  Vascular intervention on 12/1 and 12/4 by Dr. [name] - TMP/SMX, vanco, ceftazidime.  -  underwent amputation of toe 12/9 -Now has  IJ Picc for Abx administration at home.  Tentative plan of Abx till 12/18 Discharge antibiotics: Vancomycin 1 gram IV PB  X  2 doses  One on 12/14 and one dose on 12/18  Ceftazidime 1 gram IV every 24 hours until 08/18/19 As per ID recommendations  3.  Anemia of chronic kidney disease:   - Aranesp ordered  4. Secondary Hyperparathyroidism with hypocalcemia.   - calcitriol - calcium acetate with meals.  Lab Results  Component Value Date   PTH 127 (H) 07/21/2019   CALCIUM 8.2 (L) 08/12/2019   PHOS 4.4 08/12/2019      LOS: 12 Colten Desroches 12/12/20201:38 PM

## 2019-08-14 ENCOUNTER — Encounter: Payer: Self-pay | Admitting: Cardiology

## 2019-08-18 ENCOUNTER — Telehealth (INDEPENDENT_AMBULATORY_CARE_PROVIDER_SITE_OTHER): Payer: Self-pay

## 2019-08-18 NOTE — Telephone Encounter (Signed)
Patient called stating that his heel has been sore since his surgery and has not been able to rest. The patient had a toe amputation on 12/8 and central line insertion on 12/11. I spoke with Eulogio Ditch NP and she had a spoke with Dr Lucky Cowboy they recommended for the patient to call podiatry. The patient has seen his podiatrist and he informed the patient that his heel will be sore for a while.

## 2019-08-23 ENCOUNTER — Telehealth: Payer: Self-pay | Admitting: Infectious Diseases

## 2019-08-23 NOTE — Telephone Encounter (Signed)
Spoke to Mr,Cardosa regarding the tunneled catheter placed by Dr.Schnier for Iv antibiotic- he has completed Iv antibiotics- Dr.Schnier's office will call him and make an appt to remove the line The visiting nurse had gone home to try to remove the line but did not and she called me. I informed Dr.Schnier

## 2019-08-25 ENCOUNTER — Other Ambulatory Visit: Payer: Self-pay

## 2019-08-25 ENCOUNTER — Emergency Department: Payer: Medicare Other

## 2019-08-25 ENCOUNTER — Encounter: Payer: Self-pay | Admitting: Emergency Medicine

## 2019-08-25 ENCOUNTER — Emergency Department
Admission: EM | Admit: 2019-08-25 | Discharge: 2019-08-25 | Disposition: A | Discharge status: Home / Self Care | Payer: Medicare Other | Attending: Emergency Medicine | Admitting: Emergency Medicine

## 2019-08-25 DIAGNOSIS — I509 Heart failure, unspecified: Secondary | ICD-10-CM | POA: Diagnosis not present

## 2019-08-25 DIAGNOSIS — N186 End stage renal disease: Secondary | ICD-10-CM | POA: Insufficient documentation

## 2019-08-25 DIAGNOSIS — J45909 Unspecified asthma, uncomplicated: Secondary | ICD-10-CM | POA: Insufficient documentation

## 2019-08-25 DIAGNOSIS — M25571 Pain in right ankle and joints of right foot: Secondary | ICD-10-CM | POA: Diagnosis not present

## 2019-08-25 DIAGNOSIS — Z992 Dependence on renal dialysis: Secondary | ICD-10-CM | POA: Diagnosis not present

## 2019-08-25 DIAGNOSIS — Z7902 Long term (current) use of antithrombotics/antiplatelets: Secondary | ICD-10-CM | POA: Diagnosis not present

## 2019-08-25 DIAGNOSIS — Z7982 Long term (current) use of aspirin: Secondary | ICD-10-CM | POA: Diagnosis not present

## 2019-08-25 DIAGNOSIS — E1122 Type 2 diabetes mellitus with diabetic chronic kidney disease: Secondary | ICD-10-CM | POA: Insufficient documentation

## 2019-08-25 DIAGNOSIS — Z794 Long term (current) use of insulin: Secondary | ICD-10-CM | POA: Diagnosis not present

## 2019-08-25 DIAGNOSIS — I132 Hypertensive heart and chronic kidney disease with heart failure and with stage 5 chronic kidney disease, or end stage renal disease: Secondary | ICD-10-CM | POA: Insufficient documentation

## 2019-08-25 DIAGNOSIS — Z87891 Personal history of nicotine dependence: Secondary | ICD-10-CM | POA: Diagnosis not present

## 2019-08-25 DIAGNOSIS — I259 Chronic ischemic heart disease, unspecified: Secondary | ICD-10-CM | POA: Diagnosis not present

## 2019-08-25 DIAGNOSIS — M79661 Pain in right lower leg: Secondary | ICD-10-CM | POA: Diagnosis present

## 2019-08-25 MED ORDER — OXYCODONE-ACETAMINOPHEN 5-325 MG PO TABS: 1.0000 | ORAL_TABLET | Freq: Four times a day (QID) | ORAL | 0 refills | Status: AC | PRN

## 2019-08-25 NOTE — ED Provider Notes (Signed)
[facility] Emergency Department Provider Note ____________________________________________   First MD Initiated Contact with Patient 08/25/19 2032     (approximate)  I have reviewed the triage vital signs and the nursing notes.   HISTORY  Chief Complaint Leg Pain    HPI Scott [name] is a 58 y.o. male with PMH as noted below who presents with a right heel and ankle pain, persistent course over the last 3 weeks since he had a vascular procedure.  He states he is taking tramadol for it but it is not helping.  The pain is intermittent and is mainly worse when he raises his legs.  It improves when he sits with his legs off of the bed.  He denies associated swelling or redness.  He has no new trauma.  He has seen the podiatrist for it and has follow-up with the vascular surgeon and podiatrist this week.  Past Medical History:  Diagnosis Date  . Acute respiratory failure with hypoxia ([place]) 09/17/2018  . Arthritis    "left arm; right leg" (12/13/2014)  . Asthma   . CHF (congestive heart failure) ([place])   . Chronic disease anemia    [name] 12/13/2014  . Chronic kidney disease (CKD), stage IV (severe) ([place])    [name] 12/13/2014... on dialysis  . Complication of anesthesia    unable to urinate after CAPD urgery  . Continuous ambulatory peritoneal dialysis status ([place])   . Coronary artery disease    [name] 12/13/2014  . Depression   . Dysrhythmia    patient unaware of irregular heartbeat  . GERD (gastroesophageal reflux disease)   . High cholesterol    [name] 12/13/2014  . Hypertension   . Non-Q wave myocardial infarction ([place])    [name] 12/13/2014  . PVD (peripheral vascular disease) ([place])    [name] 12/13/2014  . Type II diabetes mellitus [facility])     Patient Active Problem List   Diagnosis Date Noted  . Leg pain, right 07/31/2019  . PAD (peripheral artery disease) ([place]) 07/17/2019  . Osteomyelitis of second toe of left foot ([place]) 07/17/2019  . Type II  diabetes mellitus ([place])   . PVD (peripheral vascular disease) ([place])   . Non-Q wave myocardial infarction ([place])   . Hypertension   . Coronary artery disease   . Ulcerated, foot, right, with necrosis of muscle ([place])   . Atherosclerosis of artery of extremity with ulceration ([place]) 07/03/2019  . Anemia 04/15/2019  . Cellulitis 01/21/2019  . History of colonic polyps   . Positive fecal occult blood test   . 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 Atherosclerotic heart disease of native coronary artery without angina pectoris 06/02/2017  . Encounter for screening for depression 06/02/2017  . Gastro-esophageal reflux disease with esophagitis 06/02/2017  . Other asthma 06/02/2017  . Other specified arthritis, unspecified site 06/02/2017  . Acidosis 05/31/2017  . Anemia in ESRD (end-stage renal disease) ([place]) 05/31/2017  . Hyperkalemia 05/31/2017  . Iron deficiency anemia, unspecified 05/31/2017  . Liver disease, unspecified 05/31/2017  . Moderate protein-calorie malnutrition ([place]) 05/31/2017  . Other dietary vitamin B12 deficiency anemia 05/31/2017  . Other disorders of electrolyte and fluid balance, not elsewhere classified 05/31/2017  . Other disorders resulting from impaired renal tubular function 05/31/2017  . Other hyperlipidemia 05/31/2017  . Other long term (current) drug therapy 05/31/2017  . Secondary hyperparathyroidism of renal origin ([place]) 05/31/2017  . Unspecified jaundice 05/31/2017  . ESRD (end stage  renal disease) ([place]) 11/25/2016  . Tobacco abuse 11/25/2016  . Non-ST elevation (NSTEMI) myocardial infarction ([place]) 10/21/2016  . Pure hypercholesterolemia 10/21/2016  . Type 2 diabetes mellitus with other diabetic kidney complication ([place]) 95/63/8756  . GERD (gastroesophageal reflux disease) 04/04/2015    Past Surgical History:  Procedure Laterality Date  . AMPUTATION Right 08/08/2019   Procedure: AMPUTATION RAY GREAT  TOE;  Surgeon: Sharlotte Alamo, DPM;  Location: [facility] ORS;  Service: Podiatry;  Laterality: Right;  . AMPUTATION TOE Left 05/24/2019   Procedure: Left great toe amputation;  Surgeon: Caroline More, DPM;  Location: [facility] ORS;  Service: Podiatry;  Laterality: Left;  . AMPUTATION TOE Left 07/19/2019   Procedure: AMPUTATION 2nd TOE PARTIAL RAY RESECTION;  Surgeon: Sharlotte Alamo, DPM;  Location: [facility] ORS;  Service: Podiatry;  Laterality: Left;  . AV FISTULA PLACEMENT Right 04/07/2017   Procedure: ARTERIOVENOUS (AV) FISTULA CREATION ( BRACHIALCEPHALIC );  Surgeon: Katha Cabal, MD;  Location: [facility] ORS;  Service: Vascular;  Laterality: Right;  . CAPD INSERTION N/A 04/07/2017   Procedure: LAPAROSCOPIC INSERTION CONTINUOUS AMBULATORY PERITONEAL DIALYSIS  (CAPD) CATHETER;  Surgeon: Katha Cabal, MD;  Location: [facility] ORS;  Service: Vascular;  Laterality: N/A;  . CARDIAC CATHETERIZATION  11/2014  . CENTRAL LINE INSERTION-TUNNELED N/A 08/11/2019   Procedure: CENTRAL LINE INSERTION-TUNNELED ([place]);  Surgeon: Katha Cabal, MD;  Location: [place] [facility];  Service: Cardiovascular;  Laterality: N/A;  . COLONOSCOPY WITH PROPOFOL N/A 12/01/2017   Procedure: COLONOSCOPY WITH PROPOFOL;  Surgeon: Lin Landsman, MD;  Location: [place];  Service: Endoscopy;  Laterality: N/A;  Diabetic - insulin  . COLONOSCOPY WITH PROPOFOL N/A 12/29/2017   Procedure: COLONOSCOPY WITH PROPOFOL;  Surgeon: Lin Landsman, MD;  Location: [place];  Service: Endoscopy;  Laterality: N/A;  . DIALYSIS/PERMA CATHETER INSERTION N/A 01/18/2017   Procedure: Dialysis/Perma Catheter Insertion;  Surgeon: Algernon Huxley, MD;  Location: [place] [facility];  Service: Cardiovascular;  Laterality: N/A;  . DIALYSIS/PERMA CATHETER REMOVAL N/A 07/26/2017   Procedure: DIALYSIS/PERMA CATHETER REMOVAL;  Surgeon: Algernon Huxley, MD;  Location: [place] [facility];  Service: Cardiovascular;  Laterality: N/A;  . INCISION AND  DRAINAGE OF WOUND Right ~ 10/2014   "4th toe foot"  . IRRIGATION AND DEBRIDEMENT FOOT Left 07/19/2019   Procedure: IRRIGATION AND DEBRIDEMENT FOOT;  Surgeon: Sharlotte Alamo, DPM;  Location: [facility];  Service: Podiatry;  Laterality: Left;  . LOWER EXTREMITY ANGIOGRAPHY Left 05/23/2019   Procedure: Lower Extremity Angiography;  Surgeon: Katha Cabal, MD;  Location: [place] [facility];  Service: Cardiovascular;  Laterality: Left;  . LOWER EXTREMITY ANGIOGRAPHY Left 07/18/2019   Procedure: LOWER EXTREMITY ANGIOGRAPHY;  Surgeon: Katha Cabal, MD;  Location: [place] [facility];  Service: Cardiovascular;  Laterality: Left;  . LOWER EXTREMITY ANGIOGRAPHY Right 08/01/2019   Procedure: LOWER EXTREMITY ANGIOGRAPHY;  Surgeon: Katha Cabal, MD;  Location: [name] [facility];  Service: Cardiovascular;  Laterality: Right;  . LOWER EXTREMITY ANGIOGRAPHY Right 08/04/2019   Procedure: Lower Extremity Angiography (Pedal Approach);  Surgeon: Katha Cabal, MD;  Location: [place] [facility];  Service: Cardiovascular;  Laterality: Right;  . PERCUTANEOUS CORONARY STENT INTERVENTION (PCI-S) N/A 12/14/2014   Procedure: PERCUTANEOUS CORONARY STENT INTERVENTION (PCI-S);  Surgeon: Charolette Forward, MD;  Location[facility] C[facility] LAB;  Service: Cardiovascular;  Laterality: N/A;  . PERCUTANEOUS CORONARY STENT INTERVENTION (PCI-S) N/A 12/18/2014   Procedure: PERCUTANEOUS CORONARY STENT INTERVENTION (PCI-S);  Surgeon: Charolette Forward, MD;  Location: [facility]  CATH LAB;  Service: Cardiovascular;  Laterality: N/A;  . POLYPECTOMY  12/01/2017   Procedure: POLYPECTOMY;  Surgeon: Lin Landsman, MD;  Location: [place];  Service: Endoscopy;;  . POLYPECTOMY  12/29/2017   Procedure: POLYPECTOMY;  Surgeon: Lin Landsman, MD;  Location: [place];  Service: Endoscopy;;  . TOE AMPUTATION Right 2015   4th toe    Prior to Admission medications   Medication Sig Start Date End Date Taking? Authorizing  Provider  albuterol (PROVENTIL HFA;VENTOLIN HFA) 108 (90 BASE) MCG/ACT inhaler Inhale 2 puffs into the lungs every 6 (six) hours as needed for wheezing or shortness of breath. 01/10/15   Gladstone Lighter, MD  aspirin EC 81 MG tablet Take 1 tablet (81 mg total) by mouth daily. 07/21/19   Ivor Costa, MD  atorvastatin (LIPITOR) 40 MG tablet Take 40 mg by mouth daily. 05/15/19   [provider]  B Complex-C-Folic Acid (DIALYVITE 809) 0.8 MG TABS Take 1 tablet by mouth daily. 10/25/17   [provider]  calcitRIOL (ROCALTROL) 0.25 MCG capsule Take 0.25 mcg by mouth daily.    [provider]  calcium acetate (PHOSLO) 667 MG capsule Take 667-2,001 mg by mouth See admin instructions. Take 2,001 mg by mouth 3 times daily with meals and [phone] mg with snacks 06/08/17   [provider]  Cholecalciferol (VITAMIN D) 2000 units tablet Take 2,000 Units by mouth daily.     [provider]  clopidogrel (PLAVIX) 75 MG tablet Take 1 tablet (75 mg total) by mouth daily with breakfast. Patient not taking: Reported on 07/31/2019 05/27/19   Dustin Flock, MD  furosemide (LASIX) 80 MG tablet Take 80 mg by mouth daily. 12/01/18   [provider]  gentamicin cream (GARAMYCIN) 0.1 % Apply 1 application topically daily. 03/02/18   [provider]  insulin aspart protamine- aspart (NOVOLOG MIX 70/30) (70-30) 100 UNIT/ML injection Inject 0.25 mLs (25 Units total) into the skin 2 (two) times daily with a meal. Patient taking differently: Inject 2 Units into the skin 2 (two) times daily as needed (blood sugar of 140 or higher).  03/12/18   Fritzi Mandes, MD  midodrine (PROAMATINE) 5 MG tablet Take 1 tablet (5 mg total) by mouth 3 (three) times daily with meals. 08/12/19   Kathie Dike, MD  mupirocin ointment (BACTROBAN) 2 % Place 1 application into the nose 2 (two) times daily. 07/21/19   Ivor Costa, MD  nitroGLYCERIN (NITROSTAT) 0.4 MG SL tablet Place 0.4 mg under the  tongue every 5 (five) minutes as needed for chest pain. Pt needs new Rx. Bottle has expried    [provider]  omeprazole (PRILOSEC) 20 MG capsule Take 1 capsule (20 mg total) by mouth daily. 01/10/15   Gladstone Lighter, MD  oxyCODONE-acetaminophen (PERCOCET) 5-325 MG tablet Take 1-2 tablets by mouth every 6 (six) hours as needed for up to 5 days for severe pain. 08/25/19 08/30/19  Arta Silence, MD  polyethylene glycol (MIRALAX / GLYCOLAX) 17 g packet Take 17 g by mouth daily as needed. 08/12/19   Kathie Dike, MD  sulfamethoxazole-trimethoprim (BACTRIM) 400-80 MG tablet Take 1 tablet by mouth daily. 08/12/19   Kathie Dike, MD  tamsulosin (FLOMAX) 0.4 MG CAPS capsule Take 1 capsule (0.4 mg total) by mouth daily. 06/19/19   Hollice Espy, MD  traMADol (ULTRAM) 50 MG tablet Take 50 mg by mouth every 6 (six) hours as needed for moderate pain.  07/06/19   [provider]  vitamin C (  VITAMIN C) 500 MG tablet Take 1 tablet (500 mg total) by mouth 2 (two) times daily. 07/21/19   Ivor Costa, MD    Allergies Patient has no known allergies.  Family History  Problem Relation Age of Onset  . Cancer Mother        Lung  . Cancer Father        Lung  . Diabetes Sister   . Diabetes Brother   . CAD Brother   . Hernia Son   . Cancer Brother     Social History Social History   Tobacco Use  . Smoking status: Former Smoker    Packs/day: 1.00    Years: 30.00    Pack years: 30.00    Types: Cigarettes    Start date: 08/31/1984    Quit date: 11/30/2014    Years since quitting: 4.7  . Smokeless tobacco: Never Used  Substance Use Topics  . Alcohol use: Not Currently  . Drug use: No    Review of Systems  Constitutional: No fever. Eyes: No redness. ENT: No sore throat. Cardiovascular: Denies chest pain. Respiratory: Denies shortness of breath. Gastrointestinal: No vomiting. Genitourinary: Negative for dysuria.  Musculoskeletal: Negative for back pain.  Positive  for right ankle pain. Skin: Negative for rash. Neurological: Negative for focal weakness or numbness.   ____________________________________________   PHYSICAL EXAM:  VITAL SIGNS: ED Triage Vitals  Enc Vitals Group     BP 08/25/19 1408 (!) 103/43     Pulse Rate 08/25/19 1408 91     Resp 08/25/19 1408 16     Temp 08/25/19 1408 98.3 F (36.8 C)     Temp Source 08/25/19 1408 Oral     SpO2 08/25/19 1408 100 %     Weight 08/25/19 1406 201 lb 8 oz (91.4 kg)     Height --      Head Circumference --      Peak Flow --      Pain Score 08/25/19 1405 10     Pain Loc --      Pain Edu? --      Excl. in [place]? --     Constitutional: Alert and oriented. Well appearing and in no acute distress. Eyes: Conjunctivae are normal.  Head: Atraumatic. Nose: No congestion/rhinnorhea. Mouth/Throat: Mucous membranes are moist.   Neck: Normal range of motion.  Cardiovascular: Normal rate, regular rhythm.  Good peripheral circulation. Respiratory: Normal respiratory effort.  No retractions.  Gastrointestinal: No distention.  Musculoskeletal: No lower extremity edema.  Extremities warm and well perfused.  Right foot, heel, and lower leg all with no erythema, induration, abnormal warmth, or swelling.  No significant tenderness.  Intact DP pulse.  Good color and cap refill <2 seconds. Neurologic:  Normal speech and language. No gross focal neurologic deficits are appreciated.  Skin:  Skin is warm and dry. No rash noted. Psychiatric: Mood and affect are normal. Speech and behavior are normal.  ____________________________________________   LABS (all labs ordered are listed, but only abnormal results are displayed)  Labs Reviewed - No data to display ____________________________________________  EKG   ____________________________________________  RADIOLOGY  US venous RLE: No acute DVT  ____________________________________________   PROCEDURES  Procedure(s) performed:  No  Procedures  Critical Care performed: No ____________________________________________   INITIAL IMPRESSION / ASSESSMENT AND PLAN / ED COURSE  Pertinent labs & imaging results that were available during my care of the patient were reviewed by me and considered in my medical decision making (see chart for  details).  58 year old male with PMH as noted above presents with persistent right ankle and heel area pain after a recent vascular procedure.  He states that the pain is not being adequately treated by the tramadol that he is on.  He denies swelling or other acute symptoms and has had no trauma.  I reviewed the past medical records in [place].  The patient had peripheral vascular catheterization on 12/4 with Dr. [name].  He also followed up with podiatry on 12/18.  He denies any new symptoms since that visit.  On exam, the patient is well-appearing.  His vital signs are normal.  He has no visible swelling to the foot or ankle, no tenderness, and no other abnormal exam findings.  Overall presentation is consistent with postprocedural pain, neuropathy, or other benign etiology.  There is no evidence of cellulitis or deep tissue infection, he has good capillary refill and perfusion.  There is no significant tenderness.  There is no evidence of any type of complication.  DVT study was obtained and is negative.  At this time, the patient is stable for discharge home.  He has no active pain right now.  I will prescribe Percocet for additional pain control.  He has follow-up both with podiatry and vascular surgery for this week.  I gave him very thorough return precautions and he expressed understanding.   ____________________________________________   FINAL CLINICAL IMPRESSION(S) / ED DIAGNOSES  Final diagnoses:  Acute right ankle pain      NEW MEDICATIONS STARTED DURING THIS VISIT:  Discharge Medication List as of 08/25/2019  8:58 PM    START taking these medications   Details   oxyCODONE-acetaminophen (PERCOCET) 5-325 MG tablet Take 1-2 tablets by mouth every 6 (six) hours as needed for up to 5 days for severe pain., Starting Fri 08/25/2019, Until Wed 08/30/2019, Normal         Note:  This document was prepared using Dragon voice recognition software and may include unintentional dictation errors.    Arta Silence, MD 08/25/19 2159

## 2019-08-25 NOTE — Discharge Instructions (Addendum)
Take the Percocet as needed for pain.  Do not mix it with the tramadol that you are already taking.  Follow-up with Dr. [name] as well as [name] this week as planned.  Return to the ER for new, worsening, or persistent severe pain, numbness, swelling, redness, bleeding or pus drainage, fever, or any other new or worsening symptoms that concern you.

## 2019-08-25 NOTE — ED Notes (Signed)
MD at bedside. 

## 2019-08-25 NOTE — ED Notes (Signed)
First nurse note    Presents with sore to back of right heel

## 2019-08-25 NOTE — ED Triage Notes (Signed)
Patient states he had right great toe amputated about 3 weeks ago, arrives today c/o pain to vack or right heel up to right knee, posteriorly.

## 2019-08-25 NOTE — ED Notes (Signed)
Wound dressed by this RN. Bleeding controlled. 1+ pulses in affected extremity. Pt has hx of PVD. Pinky toe noted to be black. Per pt this is chronic.

## 2019-09-06 ENCOUNTER — Telehealth: Payer: Self-pay | Admitting: Radiology

## 2019-09-06 NOTE — Telephone Encounter (Signed)
LMOM to return call. Needs to discuss rescheduling surgery previously planned with Dr Erlene Quan.

## 2019-09-07 ENCOUNTER — Other Ambulatory Visit (INDEPENDENT_AMBULATORY_CARE_PROVIDER_SITE_OTHER): Payer: Self-pay | Admitting: Vascular Surgery

## 2019-09-07 DIAGNOSIS — Z95820 Peripheral vascular angioplasty status with implants and grafts: Secondary | ICD-10-CM

## 2019-09-07 DIAGNOSIS — I70261 Atherosclerosis of native arteries of extremities with gangrene, right leg: Secondary | ICD-10-CM

## 2019-09-11 ENCOUNTER — Ambulatory Visit (INDEPENDENT_AMBULATORY_CARE_PROVIDER_SITE_OTHER): Payer: Medicare Other | Admitting: Nurse Practitioner

## 2019-09-11 ENCOUNTER — Encounter (INDEPENDENT_AMBULATORY_CARE_PROVIDER_SITE_OTHER): Payer: Commercial Managed Care - HMO

## 2019-09-11 NOTE — Telephone Encounter (Signed)
Offered an appointment for urine cytology this week. Patient declined. Agreed to appointment in 2 weeks.Appointment made.

## 2019-09-11 NOTE — Telephone Encounter (Signed)
Patient would like to defer surgery with Dr Erlene Quan at this time. He would like to wait until other health issues have improved. Requested return call in 4 weeks.

## 2019-09-11 NOTE — Telephone Encounter (Signed)
It would be helpful if he could stop by and drop off a urine if he voids at all for [place] to check a urine cytology.  If this is positive for any reason, it may not be safe to put off this procedure any longer.  If it is negative, we can continue to push this out a few more weeks until he is ready to schedule.  Hollice Espy, MD

## 2019-09-18 ENCOUNTER — Ambulatory Visit (INDEPENDENT_AMBULATORY_CARE_PROVIDER_SITE_OTHER): Payer: Medicare Other | Admitting: Vascular Surgery

## 2019-09-18 ENCOUNTER — Encounter (INDEPENDENT_AMBULATORY_CARE_PROVIDER_SITE_OTHER): Payer: Self-pay | Admitting: Vascular Surgery

## 2019-09-18 ENCOUNTER — Other Ambulatory Visit: Payer: Self-pay

## 2019-09-18 ENCOUNTER — Ambulatory Visit (INDEPENDENT_AMBULATORY_CARE_PROVIDER_SITE_OTHER): Payer: Medicare Other

## 2019-09-18 VITALS — BP 137/82 | HR 85 | Resp 12 | Ht 69.0 in

## 2019-09-18 DIAGNOSIS — I70299 Other atherosclerosis of native arteries of extremities, unspecified extremity: Secondary | ICD-10-CM

## 2019-09-18 DIAGNOSIS — I70261 Atherosclerosis of native arteries of extremities with gangrene, right leg: Secondary | ICD-10-CM

## 2019-09-18 DIAGNOSIS — E7800 Pure hypercholesterolemia, unspecified: Secondary | ICD-10-CM

## 2019-09-18 DIAGNOSIS — I10 Essential (primary) hypertension: Secondary | ICD-10-CM

## 2019-09-18 DIAGNOSIS — I2510 Atherosclerotic heart disease of native coronary artery without angina pectoris: Secondary | ICD-10-CM | POA: Diagnosis not present

## 2019-09-18 DIAGNOSIS — Z794 Long term (current) use of insulin: Secondary | ICD-10-CM

## 2019-09-18 DIAGNOSIS — Z95820 Peripheral vascular angioplasty status with implants and grafts: Secondary | ICD-10-CM | POA: Diagnosis not present

## 2019-09-18 DIAGNOSIS — E1152 Type 2 diabetes mellitus with diabetic peripheral angiopathy with gangrene: Secondary | ICD-10-CM

## 2019-09-18 DIAGNOSIS — L97909 Non-pressure chronic ulcer of unspecified part of unspecified lower leg with unspecified severity: Secondary | ICD-10-CM

## 2019-09-18 NOTE — Progress Notes (Signed)
MRN : [id number]  Scott [name] is a 59 y.o. (1960-12-02) male who presents with chief complaint of No chief complaint on file. Scott [name]  History of Present Illness:   The patient returns to the office for followup and review status post angiogram with intervention on 08/04/2019.   Percutaneous transluminal angioplasty and stent placement right superficial femoral and popliteal arteries to 6 mm with Viabahn stents with percutaneous transluminal angioplasty right peroneal to 2.5  The patient notes improvement in the lower extremity symptoms. No interval shortening of the patient's claudication distance or rest pain symptoms. Previous wounds have now healed.  No new ulcers or wounds have occurred since the last visit.  There have been no significant changes to the patient's overall health care.  The patient denies amaurosis fugax or recent TIA symptoms. There are no recent neurological changes noted. The patient denies history of DVT, PE or superficial thrombophlebitis. The patient denies recent episodes of angina or shortness of breath.   Duplex US of the right lower extremity arterial system shows patency of the tibial vessels  No outpatient medications have been marked as taking for the 09/18/19 encounter (Appointment) with [name], [name], MD.    Past Medical History:  Diagnosis Date  . Acute respiratory failure with hypoxia ([name]) 09/17/2018  . Arthritis    "left arm; right leg" (12/13/2014)  . Asthma   . CHF (congestive heart failure) ([place])   . Chronic disease anemia    Scott [name] 12/13/2014  . Chronic kidney disease (CKD), stage IV (severe) ([place])    Scott [name] 12/13/2014... on dialysis  . Complication of anesthesia    unable to urinate after CAPD urgery  . Continuous ambulatory peritoneal dialysis status ([place])   . Coronary artery disease    Scott [name] 12/13/2014  . Depression   . Dysrhythmia    patient unaware of irregular heartbeat  . GERD (gastroesophageal reflux disease)   . High  cholesterol    Scott [name] 12/13/2014  . Hypertension   . Non-Q wave myocardial infarction ([place])    Scott [name] 12/13/2014  . PVD (peripheral vascular disease) ([place])    Scott [name] 12/13/2014  . Type II diabetes mellitus ([place])     Past Surgical History:  Procedure Laterality Date  . AMPUTATION Right 08/08/2019   Procedure: AMPUTATION RAY GREAT TOE;  Surgeon: Sharlotte Alamo, DPM;  Location: [facility];  Service: Podiatry;  Laterality: Right;  . AMPUTATION TOE Left 05/24/2019   Procedure: Left great toe amputation;  Surgeon: Caroline More, DPM;  Location: [facility];  Service: Podiatry;  Laterality: Left;  . AMPUTATION TOE Left 07/19/2019   Procedure: AMPUTATION 2nd TOE PARTIAL RAY RESECTION;  Surgeon: Sharlotte Alamo, DPM;  Location: [facility];  Service: Podiatry;  Laterality: Left;  . AV FISTULA PLACEMENT Right 04/07/2017   Procedure: ARTERIOVENOUS (AV) FISTULA CREATION ( BRACHIALCEPHALIC );  Surgeon: Katha Cabal, MD;  Location: [facility];  Service: Vascular;  Laterality: Right;  . CAPD INSERTION N/A 04/07/2017   Procedure: LAPAROSCOPIC INSERTION CONTINUOUS AMBULATORY PERITONEAL DIALYSIS  (CAPD) CATHETER;  Surgeon: Katha Cabal, MD;  Location: [facility];  Service: Vascular;  Laterality: N/A;  . CARDIAC CATHETERIZATION  11/2014  . CENTRAL LINE INSERTION-TUNNELED N/A 08/11/2019   Procedure: CENTRAL LINE INSERTION-TUNNELED ([place]);  Surgeon: Katha Cabal, MD;  Location: [place] [facility];  Service: Cardiovascular;  Laterality: N/A;  . COLONOSCOPY WITH PROPOFOL N/A 12/01/2017   Procedure: COLONOSCOPY WITH PROPOFOL;  Surgeon: Lin Landsman, MD;  Location: [place];  Service:  Endoscopy;  Laterality: N/A;  Diabetic - insulin  . COLONOSCOPY WITH PROPOFOL N/A 12/29/2017   Procedure: COLONOSCOPY WITH PROPOFOL;  Surgeon: Lin Landsman, MD;  Location: [place];  Service: Endoscopy;  Laterality: N/A;  . DIALYSIS/PERMA CATHETER INSERTION N/A 01/18/2017   Procedure: Dialysis/Perma  Catheter Insertion;  Surgeon: Algernon Huxley, MD;  Location: [place] [facility];  Service: Cardiovascular;  Laterality: N/A;  . DIALYSIS/PERMA CATHETER REMOVAL N/A 07/26/2017   Procedure: DIALYSIS/PERMA CATHETER REMOVAL;  Surgeon: Algernon Huxley, MD;  Location: [place] [facility];  Service: Cardiovascular;  Laterality: N/A;  . INCISION AND DRAINAGE OF WOUND Right ~ 10/2014   "4th toe foot"  . IRRIGATION AND DEBRIDEMENT FOOT Left 07/19/2019   Procedure: IRRIGATION AND DEBRIDEMENT FOOT;  Surgeon: Sharlotte Alamo, DPM;  Location: [facility];  Service: Podiatry;  Laterality: Left;  . LOWER EXTREMITY ANGIOGRAPHY Left 05/23/2019   Procedure: Lower Extremity Angiography;  Surgeon: Katha Cabal, MD;  Location: [place] [facility];  Service: Cardiovascular;  Laterality: Left;  . LOWER EXTREMITY ANGIOGRAPHY Left 07/18/2019   Procedure: LOWER EXTREMITY ANGIOGRAPHY;  Surgeon: Katha Cabal, MD;  Location: [place] [facility];  Service: Cardiovascular;  Laterality: Left;  . LOWER EXTREMITY ANGIOGRAPHY Right 08/01/2019   Procedure: LOWER EXTREMITY ANGIOGRAPHY;  Surgeon: Katha Cabal, MD;  Location: [place] [facility];  Service: Cardiovascular;  Laterality: Right;  . LOWER EXTREMITY ANGIOGRAPHY Right 08/04/2019   Procedure: Lower Extremity Angiography (Pedal Approach);  Surgeon: Katha Cabal, MD;  Location: [place] [facility];  Service: Cardiovascular;  Laterality: Right;  . PERCUTANEOUS CORONARY STENT INTERVENTION (PCI-S) N/A 12/14/2014   Procedure: PERCUTANEOUS CORONARY STENT INTERVENTION (PCI-S);  Surgeon: Charolette Forward, MD;  Location: [facility] C[facility];  Service: Cardiovascular;  Laterality: N/A;  . PERCUTANEOUS CORONARY STENT INTERVENTION (PCI-S) N/A 12/18/2014   Procedure: PERCUTANEOUS CORONARY STENT INTERVENTION (PCI-S);  Surgeon: Charolette Forward, MD;  Location: [facility] CATH LAB;  Service: Cardiovascular;  Laterality: N/A;  . POLYPECTOMY  12/01/2017   Procedure: POLYPECTOMY;  Surgeon: Lin Landsman, MD;  Location: [place];  Service: Endoscopy;;  . POLYPECTOMY  12/29/2017   Procedure: POLYPECTOMY;  Surgeon: Lin Landsman, MD;  Location: [place];  Service: Endoscopy;;  . TOE AMPUTATION Right 2015   4th toe    Social History Social History   Tobacco Use  . Smoking status: Former Smoker    Packs/day: 1.00    Years: 30.00    Pack years: 30.00    Types: Cigarettes    Start date: 08/31/1984    Quit date: 11/30/2014    Years since quitting: 4.8  . Smokeless tobacco: Never Used  Substance Use Topics  . Alcohol use: Not Currently  . Drug use: No    Family History Family History  Problem Relation Age of Onset  . Cancer Mother        Lung  . Cancer Father        Lung  . Diabetes Sister   . Diabetes Brother   . CAD Brother   . Hernia Son   . Cancer Brother     No Known Allergies   REVIEW OF SYSTEMS (Negative unless checked)  Constitutional: [] Weight loss  [] Fever  [] Chills Cardiac: [] Chest pain   [] Chest pressure   [] Palpitations   [] Shortness of breath when laying flat   [] Shortness of breath with exertion. Vascular:  [x] Pain in legs with walking   [] Pain in legs at rest  [] History of DVT   []   Phlebitis   [] Swelling in legs   [] Varicose veins   [x] Non-healing ulcers Pulmonary:   [] Uses home oxygen   [] Productive cough   [] Hemoptysis   [] Wheeze  [] COPD   [] Asthma Neurologic:  [] Dizziness   [] Seizures   [] History of stroke   [] History of TIA  [] Aphasia   [] Vissual changes   [] Weakness or numbness in arm   [] Weakness or numbness in leg Musculoskeletal:   [] Joint swelling   [x] Joint pain   [] Low back pain Hematologic:  [] Easy bruising  [] Easy bleeding   [] Hypercoagulable state   [] Anemic Gastrointestinal:  [] Diarrhea   [] Vomiting  [] Gastroesophageal reflux/heartburn   [] Difficulty swallowing. Genitourinary:  [x] Chronic kidney disease   [] Difficult urination  [] Frequent urination   [] Blood in urine Skin:  [] Rashes   [x] Ulcers  Psychological:   [] History of anxiety   []  History of major depression.  Physical Examination  There were no vitals filed for this visit. There is no height or weight on file to calculate BMI. Gen: WD/WN, NAD Head: [place]/AT, No temporalis wasting.  Ear/Nose/Throat: Hearing grossly intact, nares w/o erythema or drainage Eyes: PER, EOMI, sclera nonicteric.  Neck: Supple, no large masses.   Pulmonary:  Good air movement, no audible wheezing bilaterally, no use of accessory muscles.  Cardiac: RRR, no JVD Vascular: ulcers present right foot Vessel Right Left  PT Not Palpable Not Palpable  DP Not Palpable Not Palpable  Gastrointestinal: Non-distended. No guarding/no peritoneal signs.  Musculoskeletal: M/S 5/5 throughout.  No deformity or atrophy.  Neurologic: CN 2-12 intact. Symmetrical.  Speech is fluent. Motor exam as listed above. Psychiatric: Judgment intact, Mood & affect appropriate for pt's clinical situation. Dermatologic: No rashes or ulcers noted.  No changes consistent with cellulitis. Lymph : No lichenification or skin changes of chronic lymphedema.  CBC Lab Results  Component Value Date   WBC 11.3 (H) 08/12/2019   HGB 8.3 (L) 08/12/2019   HCT 26.1 (L) 08/12/2019   MCV 94.9 08/12/2019   PLT 239 08/12/2019    BMET    Component Value Date/Time   NA 140 08/12/2019 0423   NA 140 04/16/2015 0000   NA 140 12/13/2014 0102   K 4.3 08/12/2019 0423   K 4.6 12/13/2014 0102   CL 103 08/12/2019 0423   CL 111 12/13/2014 0102   CO2 26 08/12/2019 0423   CO2 23 12/13/2014 0102   GLUCOSE 104 (H) 08/12/2019 0423   GLUCOSE 213 (H) 12/13/2014 0102   BUN 52 (H) 08/12/2019 0423   BUN 44 (A) 04/16/2015 0000   BUN 25 (H) 12/13/2014 0102   CREATININE 7.94 (H) 08/12/2019 0423   CREATININE 1.95 (H) 12/13/2014 0102   CALCIUM 8.2 (L) 08/12/2019 0423   CALCIUM 8.0 (L) 12/13/2014 0102   GFRNONAA 7 (L) 08/12/2019 0423   GFRNONAA 37 (L) 12/13/2014 0102   GFRAA 8 (L) 08/12/2019 0423   GFRAA 43 (L)  12/13/2014 0102   CrCl cannot be calculated (Patient's most recent lab result is older than the maximum 21 days allowed.).  COAG Lab Results  Component Value Date   INR 1.3 (H) 08/01/2019   INR 1.1 07/31/2019   INR 1.2 07/17/2019    Radiology US Venous Img Lower Unilateral Right  Result Date: 08/25/2019 CLINICAL DATA:  Right lower extremity pain. EXAM: RIGHT LOWER EXTREMITY VENOUS DOPPLER ULTRASOUND TECHNIQUE: Gray-scale sonography with graded compression, as well as color Doppler and duplex ultrasound were performed to evaluate the lower extremity deep venous systems from the level of the common  femoral vein and including the common femoral, femoral, profunda femoral, popliteal and calf veins including the posterior tibial, peroneal and gastrocnemius veins when visible. The superficial great saphenous vein was also interrogated. Spectral Doppler was utilized to evaluate flow at rest and with distal augmentation maneuvers in the common femoral, femoral and popliteal veins. COMPARISON:  None. FINDINGS: Contralateral Common Femoral Vein: Respiratory phasicity is normal and symmetric with the symptomatic side. No evidence of thrombus. Normal compressibility. Common Femoral Vein: No evidence of thrombus. Normal compressibility, respiratory phasicity and response to augmentation. Saphenofemoral Junction: No evidence of thrombus. Normal compressibility and flow on color Doppler imaging. Profunda Femoral Vein: No evidence of thrombus. Normal compressibility and flow on color Doppler imaging. Femoral Vein: No evidence of thrombus. Normal compressibility, respiratory phasicity and response to augmentation. Popliteal Vein: No evidence of thrombus. Normal compressibility, respiratory phasicity and response to augmentation. Calf Veins: No evidence of thrombus. Normal compressibility and flow on color Doppler imaging. Superficial Great Saphenous Vein: No evidence of thrombus. Normal compressibility. Venous  Reflux:  None. Other Findings: No evidence of superficial thrombophlebitis or abnormal fluid collection. IMPRESSION: No evidence of right lower extremity deep venous thrombosis. Electronically Signed   By: Aletta Edouard M.D.   On: 08/25/2019 15:03      Assessment/Plan 1. Atherosclerosis of artery of extremity with ulceration ([place]) Recommend:  The patient is status post successful angiogram with intervention.  The patient reports that the claudication symptoms and leg pain is essentially gone.   The patient denies lifestyle limiting changes at this point in time.  No further invasive studies, angiography or surgery at this time The patient should continue walking and begin a more formal exercise program.  The patient should continue antiplatelet therapy and aggressive treatment of the lipid abnormalities  Smoking cessation was again discussed  Patient should undergo noninvasive studies as ordered. The patient will follow up with me after the studies.   - VAS [place] ABI WITH/WO TBI; Future  2. Coronary artery disease without angina pectoris, unspecified vessel or lesion type, unspecified whether native or transplanted heart Continue cardiac and antihypertensive medications as already ordered and reviewed, no changes at this time.  Continue statin as ordered and reviewed, no changes at this time  Nitrates PRN for chest pain   3. Essential hypertension Continue antihypertensive medications as already ordered, these medications have been reviewed and there are no changes at this time.   4. Type 2 diabetes mellitus with diabetic peripheral angiopathy and gangrene, with long-term current use of insulin (HCC) Continue hypoglycemic medications as already ordered, these medications have been reviewed and there are no changes at this time.  Hgb A1C to be monitored as already arranged by primary service   5. Pure hypercholesterolemia Continue statin as ordered and reviewed, no changes at  this time    Hortencia Pilar, MD  09/18/2019 2:38 PM

## 2019-09-19 ENCOUNTER — Telehealth (INDEPENDENT_AMBULATORY_CARE_PROVIDER_SITE_OTHER): Payer: Self-pay | Admitting: Vascular Surgery

## 2019-09-19 ENCOUNTER — Other Ambulatory Visit (INDEPENDENT_AMBULATORY_CARE_PROVIDER_SITE_OTHER): Payer: Self-pay | Admitting: Vascular Surgery

## 2019-09-19 ENCOUNTER — Telehealth (INDEPENDENT_AMBULATORY_CARE_PROVIDER_SITE_OTHER): Payer: Self-pay

## 2019-09-19 MED ORDER — TRAMADOL HCL 50 MG PO TABS: ORAL_TABLET | ORAL | 0 refills | Status: DC

## 2019-09-19 NOTE — Telephone Encounter (Signed)
Spoke with the patient and let him know the prescription for Tramadol has been called into his pharmacy at [facility]. Patient was okay with that.

## 2019-09-19 NOTE — Telephone Encounter (Signed)
Pt states he came in office yesterday and is waiting for is RX. Has contacted pharmacy twice please advise.

## 2019-09-19 NOTE — Telephone Encounter (Signed)
I attempted to contact the patient and a message was left for a return call. 

## 2019-09-25 NOTE — Telephone Encounter (Signed)
I attempted to contact the patient based on the number left for me to call [phone]. A message was left for a return call.

## 2019-09-26 ENCOUNTER — Other Ambulatory Visit: Payer: Commercial Managed Care - HMO

## 2019-09-26 NOTE — Telephone Encounter (Signed)
Patient cancelled urine cytology appointment stating his feet hurt. He does not want to reschedule at this time. He says he will call back when he can.

## 2019-09-28 ENCOUNTER — Ambulatory Visit: Payer: Medicare Other | Admitting: Cardiology

## 2019-10-01 IMAGING — US US EXTREM LOW DUPLEX ARTERIAL BILAT
2 series · 13 of 25 positions shown · non-contrast
Comparison: None.

CLINICAL DATA: 57-year-old male with a history of diabetic foot
ulcer.

Cardiovascular risk factors include diabetes, known coronary
disease, hypertension, hyperlipidemia, known vascular disease
EXAM:
NONINVASIVE PHYSIOLOGIC VASCULAR STUDY OF BILATERAL LOWER
EXTREMITIES
TECHNIQUE: Evaluation of both lower extremities was performed at rest,
including directed duplex of the lower extremity.

[Series 1: us extrem low duplex arterial bilat · 0.10mm/px · 12 of 81 slices shown (1 of 2)]
[im 1/81]
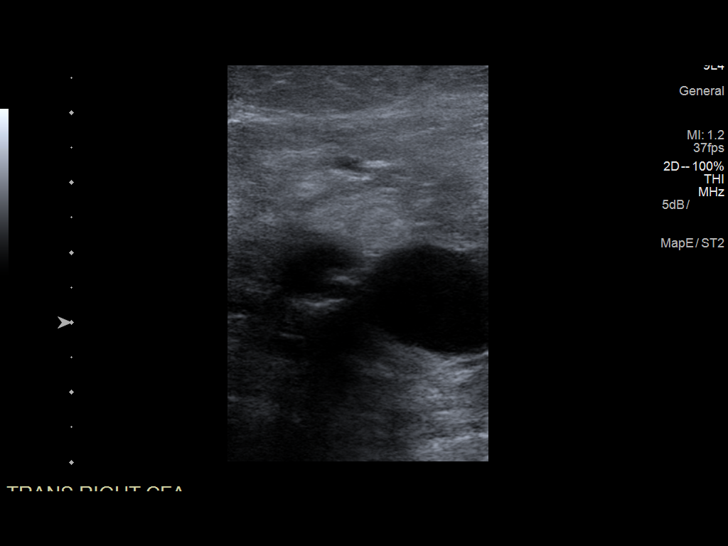
[im 7/81]
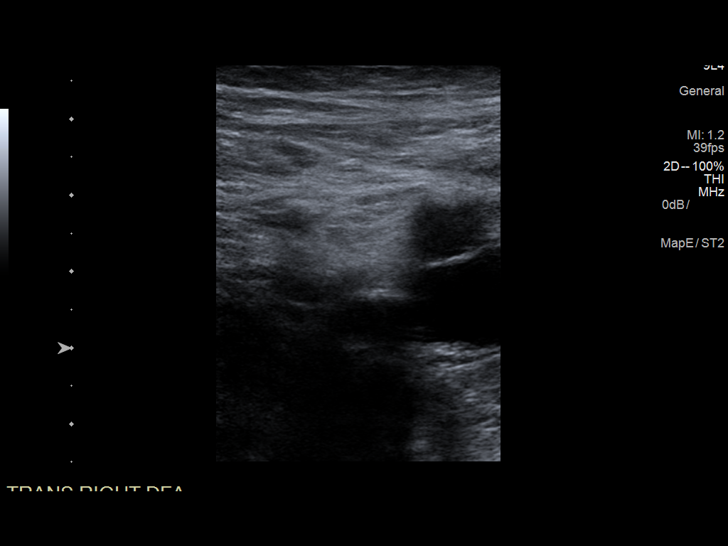
[im 14/81]
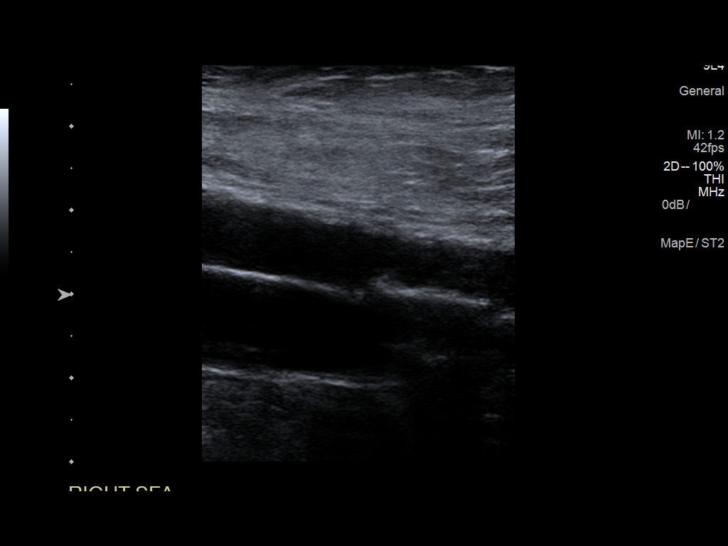
[im 21/81]
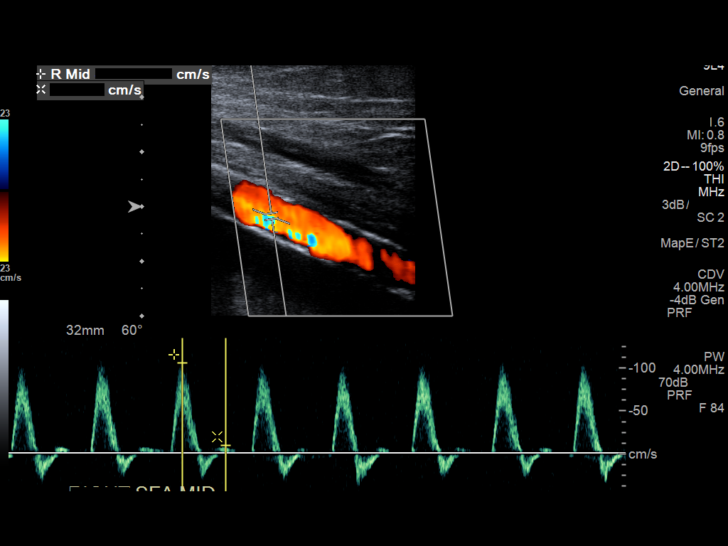
[im 28/81]
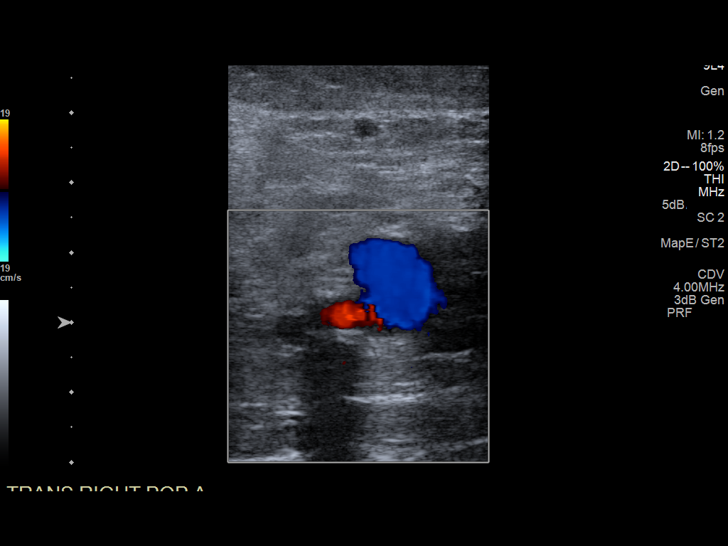
[im 35/81]
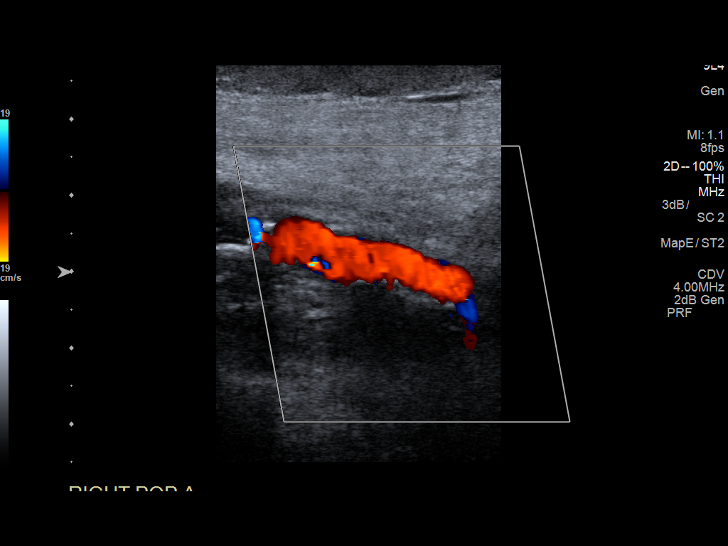
[im 42/81]
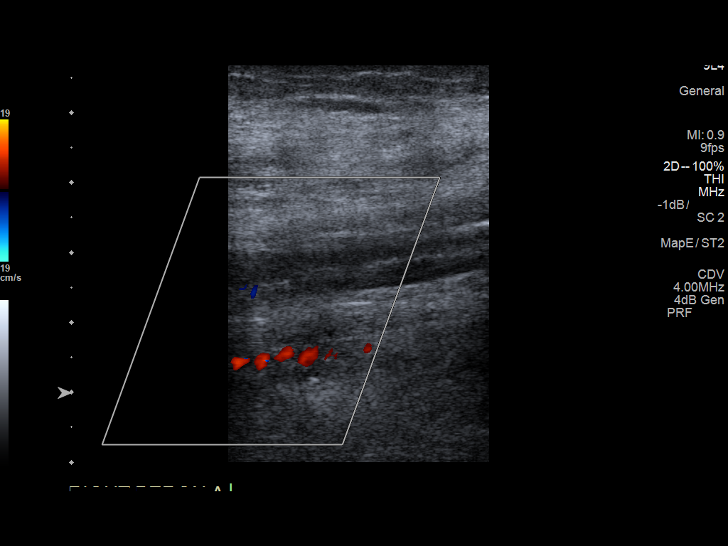
[im 49/81]
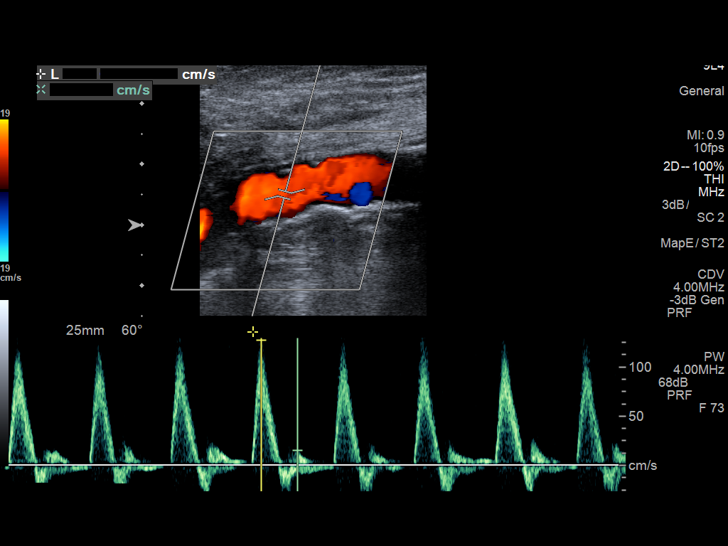
[im 56/81]
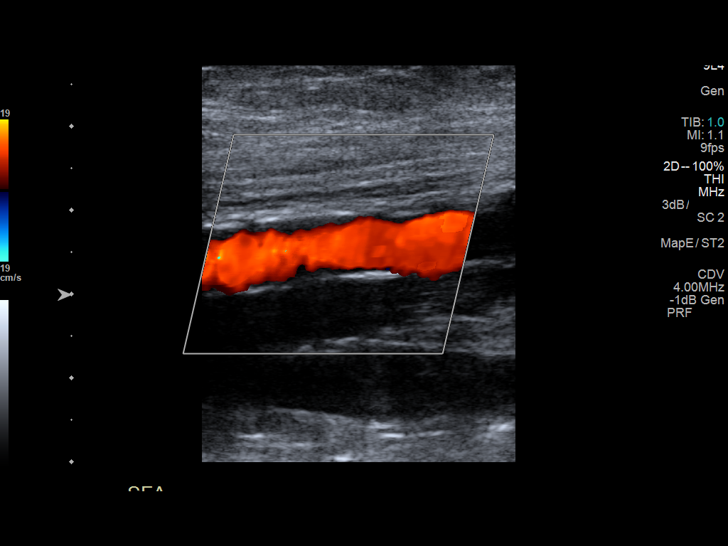
[im 63/81]
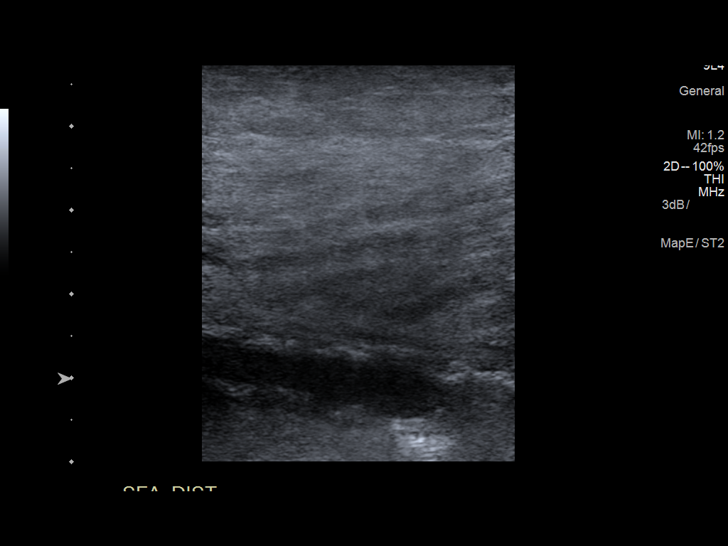
[im 70/81]
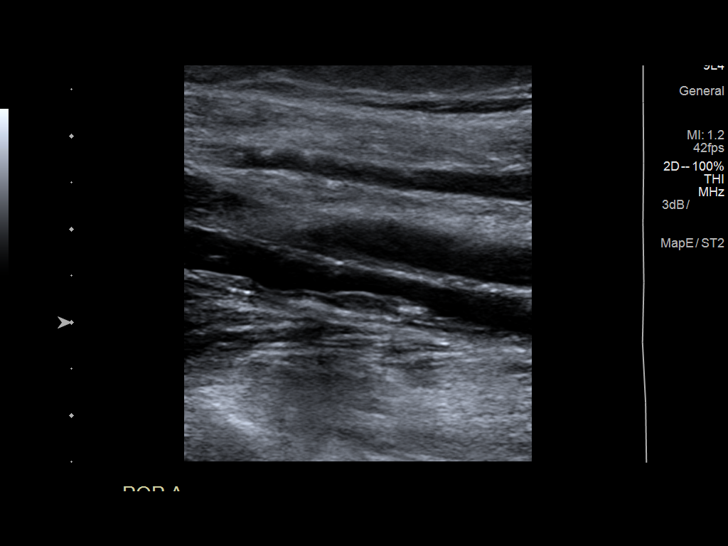
[im 77/81]
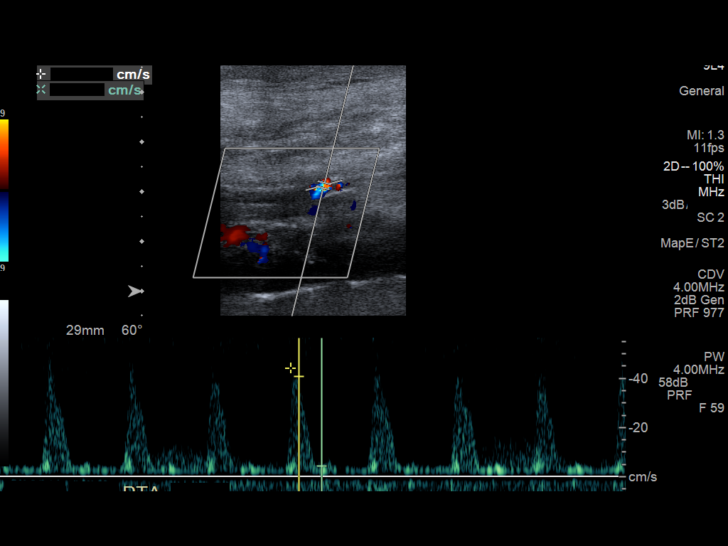

[Series 3: us extrem low duplex arterial bilat · 1 of 1 slices shown (2 of 2)]
[im 1/1]
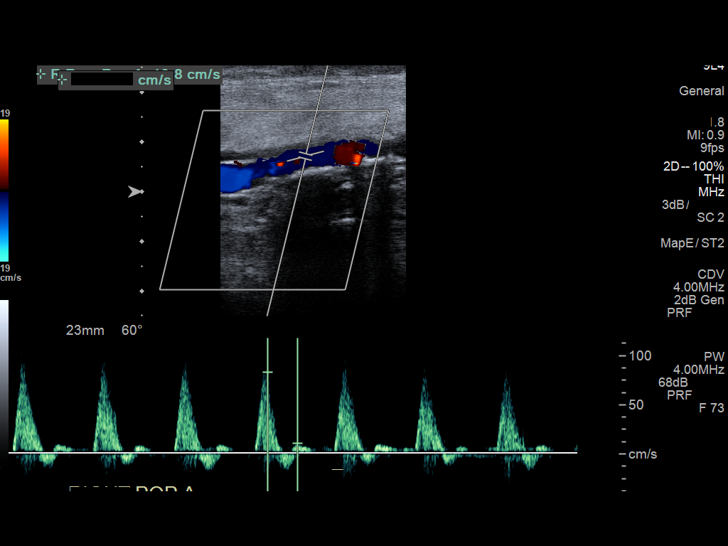

[13 of 25 positions shown; findings below may reference images not displayed]

FINDINGS: Right ABI:  Not calculated

Left ABI:  Not calculated

Right Lower Extremity: Directed duplex of the right lower extremity
demonstrates triphasic common femoral artery, profunda femoris,
superficial femoral artery, popliteal artery. Triphasic posterior
tibial artery, peroneal artery, anterior tibial artery.

Left Lower Extremity: Directed duplex of the left lower extremity
demonstrates triphasic waveform of the common femoral artery,
profunda femoris, superficial femoral artery, popliteal artery.
Monophasic waveform of the posterior tibial artery. Abnormal
waveform of the peroneal artery, which appears monophasic.
Monophasic waveform of the anterior tibial artery.
IMPRESSION: Right:

Waveforms of the right lower extremity within normal limits to the
tibial vessels.

Left:

Waveforms of the left lower extremity are within normal limits to
the popliteal artery. Below the knee, the tibial arteries
demonstrate evidence of significant arterial occlusive disease.

## 2019-10-02 DIAGNOSIS — R1084 Generalized abdominal pain: Secondary | ICD-10-CM | POA: Insufficient documentation

## 2019-10-09 DIAGNOSIS — K650 Generalized (acute) peritonitis: Secondary | ICD-10-CM | POA: Insufficient documentation

## 2019-10-13 ENCOUNTER — Other Ambulatory Visit
Admission: RE | Admit: 2019-10-13 | Discharge: 2019-10-13 | Disposition: A | Discharge status: Home / Self Care | Payer: Medicare Other | Source: Ambulatory Visit | Attending: Nephrology | Admitting: Nephrology

## 2019-10-13 DIAGNOSIS — L97419 Non-pressure chronic ulcer of right heel and midfoot with unspecified severity: Secondary | ICD-10-CM | POA: Insufficient documentation

## 2019-10-13 LAB — VANCOMYCIN, TROUGH: Vancomycin Tr: 13 ug/mL — ABNORMAL LOW (ref 15–20)

## 2019-10-16 ENCOUNTER — Ambulatory Visit (INDEPENDENT_AMBULATORY_CARE_PROVIDER_SITE_OTHER): Payer: Medicare Other

## 2019-10-16 ENCOUNTER — Ambulatory Visit (INDEPENDENT_AMBULATORY_CARE_PROVIDER_SITE_OTHER): Payer: Medicare Other | Admitting: Vascular Surgery

## 2019-10-16 ENCOUNTER — Encounter (INDEPENDENT_AMBULATORY_CARE_PROVIDER_SITE_OTHER): Payer: Self-pay | Admitting: Vascular Surgery

## 2019-10-16 ENCOUNTER — Other Ambulatory Visit: Payer: Self-pay

## 2019-10-16 VITALS — BP 148/80 | HR 85 | Resp 20 | Ht 69.0 in | Wt 222.0 lb

## 2019-10-16 DIAGNOSIS — I2510 Atherosclerotic heart disease of native coronary artery without angina pectoris: Secondary | ICD-10-CM

## 2019-10-16 DIAGNOSIS — I10 Essential (primary) hypertension: Secondary | ICD-10-CM

## 2019-10-16 DIAGNOSIS — Z794 Long term (current) use of insulin: Secondary | ICD-10-CM

## 2019-10-16 DIAGNOSIS — E1152 Type 2 diabetes mellitus with diabetic peripheral angiopathy with gangrene: Secondary | ICD-10-CM

## 2019-10-16 DIAGNOSIS — I70299 Other atherosclerosis of native arteries of extremities, unspecified extremity: Secondary | ICD-10-CM | POA: Diagnosis not present

## 2019-10-16 DIAGNOSIS — L97909 Non-pressure chronic ulcer of unspecified part of unspecified lower leg with unspecified severity: Secondary | ICD-10-CM

## 2019-10-16 DIAGNOSIS — N186 End stage renal disease: Secondary | ICD-10-CM

## 2019-10-16 NOTE — Progress Notes (Signed)
MRN : [id number]  Scott [name] is a 59 y.o. ([birth year]) male who presents with chief complaint of  Chief Complaint  Patient presents with  . Follow-up    3 Mo RLE arterial  U/S   .  History of Present Illness:   The patient is seen for evaluation of painful lower extremities and diminished pulses associated with ulceration of the foot.  The patient notes the ulcer has been present for multiple weeks and has not been improving.  It is very painful and has had some drainage.  No specific history of trauma noted by the patient.  The patient denies fever or chills.  the patient does have diabetes which has been difficult to control.  Patient notes prior to the ulcer developing the extremities were painful particularly with ambulation or activity and the discomfort is very consistent day today. Typically, the pain occurs at less than one block, progress is as activity continues to the point that the patient must stop walking. Resting including standing still for several minutes allowed resumption of the activity and the ability to walk a similar distance before stopping again. Uneven terrain and inclined shorten the distance. The pain has been progressive over the past several years.   The patient denies rest pain or dangling of an extremity off the side of the bed during the night for relief. No prior interventions or surgeries.  No history of back problems or DJD of the lumbar sacral spine.   The patient denies amaurosis fugax or recent TIA symptoms. There are no recent neurological changes noted. The patient denies history of DVT, PE or superficial thrombophlebitis. The patient denies recent episodes of angina or shortness of breath.   Current Meds  Medication Sig  . Accu-Chek FastClix Lancets MISC USE TO CHECK GLUCOSE THREE TIMES DAILY  . albuterol (PROVENTIL HFA;VENTOLIN HFA) 108 (90 BASE) MCG/ACT inhaler Inhale 2 puffs into the lungs every 6 (six) hours as needed for wheezing or  shortness of breath.  [name] aspirin EC 81 MG tablet Take 1 tablet (81 mg total) by mouth daily.  [name] atorvastatin (LIPITOR) 40 MG tablet Take 40 mg by mouth daily.  . B Complex-C-Folic Acid (DIALYVITE 597) 0.8 MG TABS Take 1 tablet by mouth daily.  . calcitRIOL (ROCALTROL) 0.25 MCG capsule Take 0.25 mcg by mouth daily.  . calcium acetate (PHOSLO) 667 MG capsule Take 667-2,001 mg by mouth See admin instructions. Take 2,001 mg by mouth 3 times daily with meals and [phone] mg with snacks  . Calcium Carbonate Antacid (TUMS CHEWY DELIGHTS) 1177 MG CHEW Chew by mouth.  . Cholecalciferol (VITAMIN D) 2000 units tablet Take 2,000 Units by mouth daily.   . clopidogrel (PLAVIX) 75 MG tablet Take 1 tablet (75 mg total) by mouth daily with breakfast.  . furosemide (LASIX) 80 MG tablet Take 80 mg by mouth daily.  [name] gentamicin cream (GARAMYCIN) 0.1 % Apply 1 application topically daily.  . insulin aspart protamine- aspart (NOVOLOG MIX 70/30) (70-30) 100 UNIT/ML injection Inject 0.25 mLs (25 Units total) into the skin 2 (two) times daily with a meal. (Patient taking differently: Inject 2 Units into the skin 2 (two) times daily as needed (blood sugar of 140 or higher). )  . midodrine (PROAMATINE) 5 MG tablet Take 1 tablet (5 mg total) by mouth 3 (three) times daily with meals.  . mupirocin ointment (BACTROBAN) 2 % Place 1 application into the nose 2 (two) times daily.  . nitroGLYCERIN (NITROSTAT) 0.4 MG SL  tablet Place 0.4 mg under the tongue every 5 (five) minutes as needed for chest pain. Pt needs new Rx. Bottle has expried  . nystatin (MYCOSTATIN) 100000 UNIT/ML suspension Take 5 mLs by mouth 3 (three) times daily.  [name] omeprazole (PRILOSEC) 20 MG capsule Take 1 capsule (20 mg total) by mouth daily.  . polyethylene glycol (MIRALAX / GLYCOLAX) 17 g packet Take 17 g by mouth daily as needed.  . sulfamethoxazole-trimethoprim (BACTRIM) 400-80 MG tablet Take 1 tablet by mouth daily.  . tamsulosin (FLOMAX) 0.4 MG CAPS  capsule Take 1 capsule (0.4 mg total) by mouth daily.  . traMADol (ULTRAM) 50 MG tablet Take 50 mg by mouth every 6 (six) hours as needed for moderate pain.   . traMADol (ULTRAM) 50 MG tablet One to Two Tabs By Mouth Every Six Hours As Needed For Pain  . vancomycin (VANCOCIN) 10 G SOLR injection   . vitamin C (VITAMIN C) 500 MG tablet Take 1 tablet (500 mg total) by mouth 2 (two) times daily.    Past Medical History:  Diagnosis Date  . Acute respiratory failure with hypoxia ([place]) 09/17/2018  . Arthritis    "left arm; right leg" (12/13/2014)  . Asthma   . CHF (congestive heart failure) ([place])   . Chronic disease anemia    [name] 12/13/2014  . Chronic kidney disease (CKD), stage IV (severe) ([place])    [name] 12/13/2014... on dialysis  . Complication of anesthesia    unable to urinate after CAPD urgery  . Continuous ambulatory peritoneal dialysis status ([place])   . Coronary artery disease    [name] 12/13/2014  . Depression   . Dysrhythmia    patient unaware of irregular heartbeat  . GERD (gastroesophageal reflux disease)   . High cholesterol    [name] 12/13/2014  . Hypertension   . Non-Q wave myocardial infarction ([place])    [name] 12/13/2014  . PVD (peripheral vascular disease) ([place])    [name] 12/13/2014  . Type II diabetes mellitus ([place])     Past Surgical History:  Procedure Laterality Date  . AMPUTATION Right 08/08/2019   Procedure: AMPUTATION RAY GREAT TOE;  Surgeon: Sharlotte Alamo, DPM;  Location: [facility];  Service: Podiatry;  Laterality: Right;  . AMPUTATION TOE Left 05/24/2019   Procedure: Left great toe amputation;  Surgeon: Caroline More, DPM;  Location: [facility];  Service: Podiatry;  Laterality: Left;  . AMPUTATION TOE Left 07/19/2019   Procedure: AMPUTATION 2nd TOE PARTIAL RAY RESECTION;  Surgeon: Sharlotte Alamo, DPM;  Location: [facility];  Service: Podiatry;  Laterality: Left;  . AV FISTULA PLACEMENT Right 04/07/2017   Procedure: ARTERIOVENOUS (AV) FISTULA CREATION ( BRACHIALCEPHALIC );   Surgeon: Katha Cabal, MD;  Location: [facility];  Service: Vascular;  Laterality: Right;  . CAPD INSERTION N/A 04/07/2017   Procedure: LAPAROSCOPIC INSERTION CONTINUOUS AMBULATORY PERITONEAL DIALYSIS  (CAPD) CATHETER;  Surgeon: Katha Cabal, MD;  Location: [facility];  Service: Vascular;  Laterality: N/A;  . CARDIAC CATHETERIZATION  11/2014  . CENTRAL LINE INSERTION-TUNNELED N/A 08/11/2019   Procedure: CENTRAL LINE INSERTION-TUNNELED (HICKMAN);  Surgeon: Katha Cabal, MD;  Location: [place] [facility];  Service: Cardiovascular;  Laterality: N/A;  . COLONOSCOPY WITH PROPOFOL N/A 12/01/2017   Procedure: COLONOSCOPY WITH PROPOFOL;  Surgeon: Lin Landsman, MD;  Location: [place];  Service: Endoscopy;  Laterality: N/A;  Diabetic - insulin  . COLONOSCOPY WITH PROPOFOL N/A 12/29/2017   Procedure: COLONOSCOPY WITH PROPOFOL;  Surgeon: Lin Landsman, MD;  Location:  [place];  Service: Endoscopy;  Laterality: N/A;  . DIALYSIS/PERMA CATHETER INSERTION N/A 01/18/2017   Procedure: Dialysis/Perma Catheter Insertion;  Surgeon: Algernon Huxley, MD;  Location: [place] [facility];  Service: Cardiovascular;  Laterality: N/A;  . DIALYSIS/PERMA CATHETER REMOVAL N/A 07/26/2017   Procedure: DIALYSIS/PERMA CATHETER REMOVAL;  Surgeon: Algernon Huxley, MD;  Location: [place] [facility];  Service: Cardiovascular;  Laterality: N/A;  . INCISION AND DRAINAGE OF WOUND Right ~ 10/2014   "4th toe foot"  . IRRIGATION AND DEBRIDEMENT FOOT Left 07/19/2019   Procedure: IRRIGATION AND DEBRIDEMENT FOOT;  Surgeon: Sharlotte Alamo, DPM;  Location: [facility];  Service: Podiatry;  Laterality: Left;  . LOWER EXTREMITY ANGIOGRAPHY Left 05/23/2019   Procedure: Lower Extremity Angiography;  Surgeon: Katha Cabal, MD;  Location: [place] [facility];  Service: Cardiovascular;  Laterality: Left;  . LOWER EXTREMITY ANGIOGRAPHY Left 07/18/2019   Procedure: LOWER EXTREMITY ANGIOGRAPHY;  Surgeon: Katha Cabal, MD;  Location: [place] [facility];  Service: Cardiovascular;  Laterality: Left;  . LOWER EXTREMITY ANGIOGRAPHY Right 08/01/2019   Procedure: LOWER EXTREMITY ANGIOGRAPHY;  Surgeon: Katha Cabal, MD;  Location: [place] [facility];  Service: Cardiovascular;  Laterality: Right;  . LOWER EXTREMITY ANGIOGRAPHY Right 08/04/2019   Procedure: Lower Extremity Angiography (Pedal Approach);  Surgeon: Katha Cabal, MD;  Location: [place] [facility];  Service: Cardiovascular;  Laterality: Right;  . PERCUTANEOUS CORONARY STENT INTERVENTION (PCI-S) N/A 12/14/2014   Procedure: PERCUTANEOUS CORONARY STENT INTERVENTION (PCI-S);  Surgeon: Charolette Forward, MD;  Location[facility];  Service: Cardiovascular;  Laterality: N/A;  . PERCUTANEOUS CORONARY STENT INTERVENTION (PCI-S) N/A 12/18/2014   Procedure: PERCUTANEOUS CORONARY STENT INTERVENTION (PCI-S);  Surgeon: Charolette Forward, MD;  Location: [facility] CATH LAB;  Service: Cardiovascular;  Laterality: N/A;  . POLYPECTOMY  12/01/2017   Procedure: POLYPECTOMY;  Surgeon: Lin Landsman, MD;  Location: [place];  Service: Endoscopy;;  . POLYPECTOMY  12/29/2017   Procedure: POLYPECTOMY;  Surgeon: Lin Landsman, MD;  Location: [facility];  Service: Endoscopy;;  . TOE AMPUTATION Right 2015   4th toe    Social History Social History   Tobacco Use  . Smoking status: Former Smoker    Packs/day: 1.00    Years: 30.00    Pack years: 30.00    Types: Cigarettes    Start date: 08/31/1984    Quit date: 11/30/2014    Years since quitting: 4.8  . Smokeless tobacco: Never Used  Substance Use Topics  . Alcohol use: Not Currently  . Drug use: No    Family History Family History  Problem Relation Age of Onset  . Cancer Mother        Lung  . Cancer Father        Lung  . Diabetes Sister   . Diabetes Brother   . CAD Brother   . Hernia Son   . Cancer Brother     No Known Allergies   REVIEW OF SYSTEMS (Negative unless  checked)  Constitutional: [] Weight loss  [] Fever  [] Chills Cardiac: [] Chest pain   [] Chest pressure   [] Palpitations   [] Shortness of breath when laying flat   [] Shortness of breath with exertion. Vascular:  [x] Pain in legs with walking   [] Pain in legs at rest  [] History of DVT   [] Phlebitis   [] Swelling in legs   [] Varicose veins   [x] Non-healing ulcers Pulmonary:   [] Uses home oxygen   [] Productive cough   [] Hemoptysis   []   Wheeze  [] COPD   [] Asthma Neurologic:  [] Dizziness   [] Seizures   [] History of stroke   [] History of TIA  [] Aphasia   [] Vissual changes   [] Weakness or numbness in arm   [] Weakness or numbness in leg Musculoskeletal:   [] Joint swelling   [] Joint pain   [] Low back pain Hematologic:  [] Easy bruising  [] Easy bleeding   [] Hypercoagulable state   [] Anemic Gastrointestinal:  [] Diarrhea   [] Vomiting  [] Gastroesophageal reflux/heartburn   [] Difficulty swallowing. Genitourinary:  [] Chronic kidney disease   [] Difficult urination  [] Frequent urination   [] Blood in urine Skin:  [] Rashes   [] Ulcers  Psychological:  [] History of anxiety   []  History of major depression.  Physical Examination  Vitals:   10/16/19 1611  BP: (!) 148/80  Pulse: 85  Resp: 20  Weight: 222 lb (100.7 kg)  Height: 5\' 9"  (1.753 m)   Body mass index is 32.78 kg/m. Gen: WD/WN, NAD Head: [place]/AT, No temporalis wasting.  Ear/Nose/Throat: Hearing grossly intact, nares w/o erythema or drainage Eyes: PER, EOMI, sclera nonicteric.  Neck: Supple, no large masses.   Pulmonary:  Good air movement, no audible wheezing bilaterally, no use of accessory muscles.  Cardiac: RRR, no JVD Vascular: bilateral foot ulcers Vessel Right Left  Radial Palpable Palpable  PT Not Palpable Not Palpable  DP Not Palpable Not Palpable  Gastrointestinal: Non-distended. No guarding/no peritoneal signs.  Musculoskeletal: M/S 5/5 throughout.  No deformity or atrophy.  Neurologic: CN 2-12 intact. Symmetrical.  Speech is fluent. Motor  exam as listed above. Psychiatric: Judgment intact, Mood & affect appropriate for pt's clinical situation. Dermatologic: No rashes or ulcers noted.  No changes consistent with cellulitis. Lymph : No lichenification or skin changes of chronic lymphedema.  CBC Lab Results  Component Value Date   WBC 11.3 (H) 08/12/2019   HGB 8.3 (L) 08/12/2019   HCT 26.1 (L) 08/12/2019   MCV 94.9 08/12/2019   PLT 239 08/12/2019    BMET    Component Value Date/Time   NA 140 08/12/2019 0423   NA 140 04/16/2015 0000   NA 140 12/13/2014 0102   K 4.3 08/12/2019 0423   K 4.6 12/13/2014 0102   CL 103 08/12/2019 0423   CL 111 12/13/2014 0102   CO2 26 08/12/2019 0423   CO2 23 12/13/2014 0102   GLUCOSE 104 (H) 08/12/2019 0423   GLUCOSE 213 (H) 12/13/2014 0102   BUN 52 (H) 08/12/2019 0423   BUN 44 (A) 04/16/2015 0000   BUN 25 (H) 12/13/2014 0102   CREATININE 7.94 (H) 08/12/2019 0423   CREATININE 1.95 (H) 12/13/2014 0102   CALCIUM 8.2 (L) 08/12/2019 0423   CALCIUM 8.0 (L) 12/13/2014 0102   GFRNONAA 7 (L) 08/12/2019 0423   GFRNONAA 37 (L) 12/13/2014 0102   GFRAA 8 (L) 08/12/2019 0423   GFRAA 43 (L) 12/13/2014 0102   CrCl cannot be calculated (Patient's most recent lab result is older than the maximum 21 days allowed.).  COAG Lab Results  Component Value Date   INR 1.3 (H) 08/01/2019   INR 1.1 07/31/2019   INR 1.2 07/17/2019    Radiology ABI  Result Date: 09/18/2019 LOWER EXTREMITY DOPPLER STUDY High Risk Factors: Hypertension.  Vascular Interventions: 05/23/2019 PTA of Lt ATA and dorsalis pedis artery. PTA                          07/18/2019: PTA and Stent of the Left Anterior Tibial  Artery. Thrombectomy of the Left Anterior Tibial Artery.                          08/01/2019: PTA of the Right PFA.                          08/04/2019: PTA and Stent of the Right SFA and Popliteal                         Artery. PTA to the Right Peroneal Artery. Comparison Study: 06/08/2019  Performing Technologist: Almira Coaster RVS  Examination Guidelines: A complete evaluation includes at minimum, Doppler waveform signals and systolic blood pressure reading at the level of bilateral brachial, anterior tibial, and posterior tibial arteries, when vessel segments are accessible. Bilateral testing is considered an integral part of a complete examination. Photoelectric Plethysmograph (PPG) waveforms and toe systolic pressure readings are included as required and additional duplex testing as needed. Limited examinations for reoccurring indications may be performed as noted.  ABI Findings: +--------+------------------+-----+----------+-----------+ Right   Rt Pressure (mmHg)IndexWaveform  Comment     +--------+------------------+-----+----------+-----------+ Brachial                                 Port in Arm +--------+------------------+-----+----------+-----------+ ATA                            monophasic            +--------+------------------+-----+----------+-----------+ PTA                            monophasic            +--------+------------------+-----+----------+-----------+ PERO                           monophasic            +--------+------------------+-----+----------+-----------+ +--------+------------------+-----+----------+-------+ Left    Lt Pressure (mmHg)IndexWaveform  Comment +--------+------------------+-----+----------+-------+ [id number]                                      +--------+------------------+-----+----------+-------+ ATA                            monophasic        +--------+------------------+-----+----------+-------+ PTA                            monophasic        +--------+------------------+-----+----------+-------+ PERO                           monophasic        +--------+------------------+-----+----------+-------+  Summary: Right: Imaging and Waveforms were obtained to visualize the presence of flow  in the PTA, ATA and Peroneal Artery at the level of the Ankle. Left: Due to Inability to acquire a Blood Pressure in the Left Arm, Imaging and Waveforms were obtained to visualize the presence of flow in the PTA, ATA and Peroneal Artery at the level of the Ankle.  *See table(s) above for measurements and observations.  Electronically signed  by Hortencia Pilar MD on 09/18/2019 at 4:28:54 PM.    Final    VAS [place] LOWER EXTREMITY ARTERIAL DUPLEX  Result Date: 10/16/2019 LOWER EXTREMITY ARTERIAL DUPLEX STUDY Indications: Ulceration, and peripheral artery disease. High Risk Factors: Hypertension.  Vascular Interventions: 05/23/2019 PTA of Lt ATA and dorsalis pedis artery. PTA                          07/18/2019: PTA and stent of the Left Anterior Tibial                         Artery. Thrombectomy of the Left Anterior Tibial Artery.                          08/01/2019: PTA of the Right PFA.                          08/04/2019: PTA and Stent of the Right SFA and Popliteal                         Artery. PTA to the Right Peroneal Artery. Current ABI:            Not obtained Comparison Study: 07/03/2019 Performing Technologist: Almira Coaster RVS  Examination Guidelines: A complete evaluation includes B-mode imaging, spectral Doppler, color Doppler, and power Doppler as needed of all accessible portions of each vessel. Bilateral testing is considered an integral part of a complete examination. Limited examinations for reoccurring indications may be performed as noted.  +-----------+--------+-----+--------+----------+--------+ RIGHT      PSV cm/sRatioStenosisWaveform  Comments +-----------+--------+-----+--------+----------+--------+ CFA Distal 59                   monophasic         +-----------+--------+-----+--------+----------+--------+ DFA        16                   monophasic         +-----------+--------+-----+--------+----------+--------+ SFA Prox   48                   monophasic          +-----------+--------+-----+--------+----------+--------+ SFA Mid    52                   monophasic         +-----------+--------+-----+--------+----------+--------+ SFA Distal 33                   monophasic         +-----------+--------+-----+--------+----------+--------+ POP Distal 30                   monophasic         +-----------+--------+-----+--------+----------+--------+ ATA Distal 0                    Absent             +-----------+--------+-----+--------+----------+--------+ PTA Distal 57                   monophasic         +-----------+--------+-----+--------+----------+--------+ PERO Distal32                   monophasic         +-----------+--------+-----+--------+----------+--------+  +-----------+--------+-----+--------+----------+--------+ LEFT  PSV cm/sRatioStenosisWaveform  Comments +-----------+--------+-----+--------+----------+--------+ CFA Distal 58                   monophasic         +-----------+--------+-----+--------+----------+--------+ DFA        79                   monophasic         +-----------+--------+-----+--------+----------+--------+ SFA Prox   69                   monophasic         +-----------+--------+-----+--------+----------+--------+ SFA Mid    72                   monophasic         +-----------+--------+-----+--------+----------+--------+ SFA Distal 72                   monophasic         +-----------+--------+-----+--------+----------+--------+ POP Distal 58                   monophasic         +-----------+--------+-----+--------+----------+--------+ ATA Distal 58                   monophasic         +-----------+--------+-----+--------+----------+--------+ PTA Distal 19                   monophasic         +-----------+--------+-----+--------+----------+--------+ PERO Distal0                    Absent              +-----------+--------+-----+--------+----------+--------+  Summary: Right: Imaging and Waveforms obtained throughout in thee Right Lower Extremity. No flow seen in the Right Anterior Tibial Artery. Left: Imaging and Waveforms obtained throughout in thee Left Lower Extremity. No flow seen in the Left Peroneal Artery and collateral flow seen in the area of the Left PTA Distal segment.  See table(s) above for measurements and observations. Electronically signed by Hortencia Pilar MD on 10/16/2019 at 4:15:44 PM.    Final      Assessment/Plan 1. Atherosclerosis of artery of extremity with ulceration ([place]) I have had a long discussion with the patient and his wife.  At this time his arterial disease is non-reconstructable and I have recommended BKA.  He is not ready for this and wishes to continue wound care.  I will arrange for Hickman removal.  Risk and benefits were reviewed the patient.  Indications for the procedure were reviewed.  All questions were answered, the patient agrees to proceed.   2. Atherosclerosis of native coronary artery of native heart without angina pectoris Continue cardiac and antihypertensive medications as already ordered and reviewed, no changes at this time.  Continue statin as ordered and reviewed, no changes at this time  Nitrates PRN for chest pain   3. Essential hypertension Continue antihypertensive medications as already ordered, these medications have been reviewed and there are no changes at this time.   4. Type 2 diabetes mellitus with diabetic peripheral angiopathy and gangrene, with long-term current use of insulin (HCC) Continue hypoglycemic medications as already ordered, these medications have been reviewed and there are no changes at this time.  Hgb A1C to be monitored as already arranged by primary service   5. ESRD (end stage renal disease) ([place]) The patient returns to the office  for followup of their dialysis access. The function of the access  has been stable. The patient denies increased bleeding time or increased recirculation. Patient denies difficulty with cannulation. The patient denies hand pain or other symptoms consistent with steal phenomena.  No significant arm swelling.  The patient denies redness or swelling at the access site. The patient denies fever or chills at home or while on dialysis.  The patient denies amaurosis fugax or recent TIA symptoms. There are no recent neurological changes noted. The patient denies claudication symptoms or rest pain symptoms. The patient denies history of DVT, PE or superficial thrombophlebitis. The patient denies recent episodes of angina or shortness of breath.    Hortencia Pilar, MD  10/16/2019 4:24 PM

## 2019-10-17 ENCOUNTER — Telehealth (INDEPENDENT_AMBULATORY_CARE_PROVIDER_SITE_OTHER): Payer: Self-pay

## 2019-10-17 NOTE — Telephone Encounter (Signed)
Spoke with the patient and he is now scheduled with Dr. [name] for a tunneled Hickman cath removal on 10/24/19 with a 11:00 am arrival time to the [facility]. Patient will do covid testing on 10/20/19 between 12:30-2:30 pm at the [facility]. Pre-procedure instructions were discussed and will be mailed to the patient.

## 2019-10-19 ENCOUNTER — Encounter (INDEPENDENT_AMBULATORY_CARE_PROVIDER_SITE_OTHER): Payer: Self-pay | Admitting: Vascular Surgery

## 2019-10-20 ENCOUNTER — Other Ambulatory Visit: Admission: RE | Admit: 2019-10-20 | Payer: Medicare Other | Source: Ambulatory Visit

## 2019-10-23 ENCOUNTER — Other Ambulatory Visit: Payer: Medicare Other

## 2019-10-23 ENCOUNTER — Other Ambulatory Visit: Payer: Self-pay

## 2019-10-23 ENCOUNTER — Other Ambulatory Visit
Admission: RE | Admit: 2019-10-23 | Discharge: 2019-10-23 | Disposition: A | Discharge status: Home / Self Care | Payer: Medicare Other | Source: Ambulatory Visit | Attending: Vascular Surgery | Admitting: Vascular Surgery

## 2019-10-23 DIAGNOSIS — Z20822 Contact with and (suspected) exposure to covid-19: Secondary | ICD-10-CM | POA: Diagnosis not present

## 2019-10-23 DIAGNOSIS — Z01812 Encounter for preprocedural laboratory examination: Secondary | ICD-10-CM | POA: Insufficient documentation

## 2019-10-23 LAB — SARS CORONAVIRUS 2 (TAT 6-24 HRS): SARS Coronavirus 2: NEGATIVE

## 2019-10-24 ENCOUNTER — Ambulatory Visit: Admission: RE | Admit: 2019-10-24 | Payer: Medicare Other | Source: Home / Self Care | Admitting: Vascular Surgery

## 2019-10-24 ENCOUNTER — Other Ambulatory Visit (INDEPENDENT_AMBULATORY_CARE_PROVIDER_SITE_OTHER): Payer: Self-pay | Admitting: Vascular Surgery

## 2019-10-24 ENCOUNTER — Ambulatory Visit
Admission: RE | Admit: 2019-10-24 | Discharge: 2019-10-24 | Disposition: A | Discharge status: Home / Self Care | Payer: Medicare Other | Source: Ambulatory Visit | Attending: Vascular Surgery | Admitting: Vascular Surgery

## 2019-10-24 ENCOUNTER — Encounter: Admission: RE | Payer: Self-pay | Source: Home / Self Care

## 2019-10-24 ENCOUNTER — Encounter
Admission: RE | Disposition: A | Discharge status: Home / Self Care | Payer: Self-pay | Source: Ambulatory Visit | Attending: Vascular Surgery

## 2019-10-24 DIAGNOSIS — I70261 Atherosclerosis of native arteries of extremities with gangrene, right leg: Secondary | ICD-10-CM

## 2019-10-24 DIAGNOSIS — N186 End stage renal disease: Secondary | ICD-10-CM

## 2019-10-24 DIAGNOSIS — Z452 Encounter for adjustment and management of vascular access device: Secondary | ICD-10-CM | POA: Diagnosis present

## 2019-10-24 HISTORY — PX: DIALYSIS/PERMA CATHETER REMOVAL: CATH118289

## 2019-10-24 SURGERY — DIALYSIS/PERMA CATHETER REMOVAL
Anesthesia: LOCAL

## 2019-10-24 MED ORDER — LIDOCAINE-EPINEPHRINE (PF) 1 %-1:200000 IJ SOLN
INTRAMUSCULAR | Status: DC | PRN
  Administered 2019-10-24: 5 mL via INTRADERMAL

## 2019-10-24 SURGICAL SUPPLY — 2 items
FORCEPS HALSTEAD CVD 5IN STRL (INSTRUMENTS) ×1 IMPLANT
TRAY LACERAT/PLASTIC (MISCELLANEOUS) ×1 IMPLANT

## 2019-10-24 NOTE — Op Note (Signed)
Operative Note   Preoperative diagnosis:    1. Hickman placed for long term ABX  Postoperative diagnosis:   1. Hickman removed due to completion of ABX course  Procedure:  Removal of Right Hickman Catheter  Physician Assistant: Hezzie Bump PA-C  Surgeon:  Hortencia Pilar, MD  Anesthesia:  Local  EBL:  Minimal  Indication for the Procedure:  The patient has completed his course of IB ABX and no longer needs their Hickman Catheter.  This can be removed.  Risks and benefits are discussed and informed consent is obtained.  Description of the Procedure:  The patient's right neck, chest and existing catheter were sterilely prepped and draped. The area around the catheter was anesthetized copiously with 1% lidocaine. The catheter was dissected out with curved hemostats until the cuff was freed from the surrounding fibrous sheath. The fiber sheath was transected, and the catheter was then removed in its entirety using gentle traction. Pressure was held and sterile dressings were placed. The patient tolerated the procedure well and was taken to the recovery room in stable condition.  Scott [name] [name]  10/24/2019, 12:32 PM  This note was created with Dragon Medical transcription system. Any errors in dictation are purely unintentional.

## 2019-10-24 NOTE — H&P (Signed)
[facility] History & Physical Update  The patient was interviewed and re-examined.  The patient's previous History and Physical has been reviewed and is unchanged.  There is no change in the plan of care.  [place], PA-C  10/24/2019, 12:31 PM

## 2019-10-24 NOTE — Discharge Instructions (Signed)
Tunneled Catheter Removal, Care After °Refer to this sheet in the next few weeks. These instructions provide you with information about caring for yourself after your procedure. Your health care provider may also give you more specific instructions. Your treatment has been planned according to current medical practices, but problems sometimes occur. Call your health care provider if you have any problems or questions after your procedure. °What can I expect after the procedure? °After the procedure, it is common to have: °· Some mild redness, swelling, and pain around your catheter site. ° ° °Follow these instructions at home: °Incision care  °· Check your removal site  every day for signs of infection. Check for: °¨ More redness, swelling, or pain. °¨ More fluid or blood. °¨ Warmth. °¨ Pus or a bad smell. °· Follow instructions from your health care provider about how to take care of your removal site. Make sure you: °¨ Wash your hands with soap and water before you change your bandages (dressings). If soap and water are not available, use hand sanitizer. °Activity  °· Return to your normal activities as told by your health care provider. Ask your health care provider what activities are safe for you. °· Do not lift anything that is heavier than 10 lb (4.5 kg) for 3 weeks or as long as told by your health care provider. ° °Contact a health care provider if: °· You have more fluid or blood coming from your removal site °· You have more redness, swelling, or pain at your incisions or around the area where your catheter was removed °· Your removal site feel warm to the touch. °· You feel unusually weak. °· You feel nauseous.. °· Get help right away if °· You have swelling in your arm, shoulder, neck, or face. °· You develop chest pain. °· You have difficulty breathing. °· You feel dizzy or light-headed. °· You have pus or a bad smell coming from your removal site °· You have a fever. °· You develop bleeding from your  removal site, and your bleeding does not stop. °This information is not intended to replace advice given to you by your health care provider. Make sure you discuss any questions you have with your health care provider. °Document Released: 08/03/2012 Document Revised: 04/19/2016 Document Reviewed: 05/13/2015 °Elsevier Interactive Patient Education © 2017 Elsevier Inc. ° °

## 2019-10-25 ENCOUNTER — Encounter: Payer: Self-pay | Admitting: Cardiology

## 2019-10-31 ENCOUNTER — Telehealth (INDEPENDENT_AMBULATORY_CARE_PROVIDER_SITE_OTHER): Payer: Self-pay

## 2019-10-31 NOTE — Telephone Encounter (Signed)
I contacted the patient to get him rescheduled for a permcath removal and per the patient he has already had it removed.

## 2019-11-16 ENCOUNTER — Ambulatory Visit (INDEPENDENT_AMBULATORY_CARE_PROVIDER_SITE_OTHER): Payer: Medicare Other | Admitting: Vascular Surgery

## 2019-11-16 ENCOUNTER — Encounter (INDEPENDENT_AMBULATORY_CARE_PROVIDER_SITE_OTHER): Payer: Medicare Other

## 2019-11-23 ENCOUNTER — Other Ambulatory Visit: Payer: Medicare Other

## 2020-01-12 ENCOUNTER — Emergency Department: Payer: Medicare Other

## 2020-01-12 ENCOUNTER — Inpatient Hospital Stay
Admission: EM | Admit: 2020-01-12 | Discharge: 2020-01-21 | DRG: 291 | Disposition: A | Discharge status: Home / Self Care | Payer: Medicare Other | Attending: Internal Medicine | Admitting: Internal Medicine

## 2020-01-12 ENCOUNTER — Other Ambulatory Visit: Payer: Self-pay

## 2020-01-12 DIAGNOSIS — Z955 Presence of coronary angioplasty implant and graft: Secondary | ICD-10-CM

## 2020-01-12 DIAGNOSIS — M1990 Unspecified osteoarthritis, unspecified site: Secondary | ICD-10-CM | POA: Diagnosis present

## 2020-01-12 DIAGNOSIS — Z89422 Acquired absence of other left toe(s): Secondary | ICD-10-CM

## 2020-01-12 DIAGNOSIS — L89610 Pressure ulcer of right heel, unstageable: Secondary | ICD-10-CM | POA: Diagnosis present

## 2020-01-12 DIAGNOSIS — J45909 Unspecified asthma, uncomplicated: Secondary | ICD-10-CM | POA: Diagnosis present

## 2020-01-12 DIAGNOSIS — D631 Anemia in chronic kidney disease: Secondary | ICD-10-CM | POA: Diagnosis present

## 2020-01-12 DIAGNOSIS — I2510 Atherosclerotic heart disease of native coronary artery without angina pectoris: Secondary | ICD-10-CM | POA: Diagnosis present

## 2020-01-12 DIAGNOSIS — T85611A Breakdown (mechanical) of intraperitoneal dialysis catheter, initial encounter: Secondary | ICD-10-CM | POA: Diagnosis present

## 2020-01-12 DIAGNOSIS — I5023 Acute on chronic systolic (congestive) heart failure: Secondary | ICD-10-CM | POA: Diagnosis present

## 2020-01-12 DIAGNOSIS — Z821 Family history of blindness and visual loss: Secondary | ICD-10-CM

## 2020-01-12 DIAGNOSIS — E785 Hyperlipidemia, unspecified: Secondary | ICD-10-CM | POA: Diagnosis present

## 2020-01-12 DIAGNOSIS — Z992 Dependence on renal dialysis: Secondary | ICD-10-CM

## 2020-01-12 DIAGNOSIS — I959 Hypotension, unspecified: Secondary | ICD-10-CM | POA: Diagnosis present

## 2020-01-12 DIAGNOSIS — R300 Dysuria: Secondary | ICD-10-CM | POA: Diagnosis present

## 2020-01-12 DIAGNOSIS — R0603 Acute respiratory distress: Secondary | ICD-10-CM | POA: Diagnosis not present

## 2020-01-12 DIAGNOSIS — Z79891 Long term (current) use of opiate analgesic: Secondary | ICD-10-CM

## 2020-01-12 DIAGNOSIS — E1142 Type 2 diabetes mellitus with diabetic polyneuropathy: Secondary | ICD-10-CM | POA: Diagnosis present

## 2020-01-12 DIAGNOSIS — Z794 Long term (current) use of insulin: Secondary | ICD-10-CM | POA: Diagnosis not present

## 2020-01-12 DIAGNOSIS — Z825 Family history of asthma and other chronic lower respiratory diseases: Secondary | ICD-10-CM

## 2020-01-12 DIAGNOSIS — J9601 Acute respiratory failure with hypoxia: Secondary | ICD-10-CM | POA: Diagnosis present

## 2020-01-12 DIAGNOSIS — N186 End stage renal disease: Secondary | ICD-10-CM | POA: Diagnosis present

## 2020-01-12 DIAGNOSIS — E1129 Type 2 diabetes mellitus with other diabetic kidney complication: Secondary | ICD-10-CM | POA: Diagnosis present

## 2020-01-12 DIAGNOSIS — Z6835 Body mass index (BMI) 35.0-35.9, adult: Secondary | ICD-10-CM

## 2020-01-12 DIAGNOSIS — L89152 Pressure ulcer of sacral region, stage 2: Secondary | ICD-10-CM | POA: Diagnosis present

## 2020-01-12 DIAGNOSIS — Z7982 Long term (current) use of aspirin: Secondary | ICD-10-CM

## 2020-01-12 DIAGNOSIS — K219 Gastro-esophageal reflux disease without esophagitis: Secondary | ICD-10-CM | POA: Diagnosis present

## 2020-01-12 DIAGNOSIS — Z87891 Personal history of nicotine dependence: Secondary | ICD-10-CM

## 2020-01-12 DIAGNOSIS — R000 Tachycardia, unspecified: Secondary | ICD-10-CM | POA: Diagnosis present

## 2020-01-12 DIAGNOSIS — E1152 Type 2 diabetes mellitus with diabetic peripheral angiopathy with gangrene: Secondary | ICD-10-CM | POA: Diagnosis present

## 2020-01-12 DIAGNOSIS — Y812 Prosthetic and other implants, materials and accessory general- and plastic-surgery devices associated with adverse incidents: Secondary | ICD-10-CM | POA: Diagnosis present

## 2020-01-12 DIAGNOSIS — E7800 Pure hypercholesterolemia, unspecified: Secondary | ICD-10-CM | POA: Diagnosis present

## 2020-01-12 DIAGNOSIS — I252 Old myocardial infarction: Secondary | ICD-10-CM

## 2020-01-12 DIAGNOSIS — I25118 Atherosclerotic heart disease of native coronary artery with other forms of angina pectoris: Secondary | ICD-10-CM | POA: Diagnosis present

## 2020-01-12 DIAGNOSIS — E1159 Type 2 diabetes mellitus with other circulatory complications: Secondary | ICD-10-CM | POA: Diagnosis not present

## 2020-01-12 DIAGNOSIS — I152 Hypertension secondary to endocrine disorders: Secondary | ICD-10-CM | POA: Diagnosis present

## 2020-01-12 DIAGNOSIS — E1169 Type 2 diabetes mellitus with other specified complication: Secondary | ICD-10-CM | POA: Diagnosis present

## 2020-01-12 DIAGNOSIS — Z8249 Family history of ischemic heart disease and other diseases of the circulatory system: Secondary | ICD-10-CM

## 2020-01-12 DIAGNOSIS — Z79899 Other long term (current) drug therapy: Secondary | ICD-10-CM

## 2020-01-12 DIAGNOSIS — I509 Heart failure, unspecified: Secondary | ICD-10-CM

## 2020-01-12 DIAGNOSIS — I739 Peripheral vascular disease, unspecified: Secondary | ICD-10-CM | POA: Diagnosis present

## 2020-01-12 DIAGNOSIS — S91302A Unspecified open wound, left foot, initial encounter: Secondary | ICD-10-CM | POA: Diagnosis present

## 2020-01-12 DIAGNOSIS — Z515 Encounter for palliative care: Secondary | ICD-10-CM | POA: Diagnosis not present

## 2020-01-12 DIAGNOSIS — E1122 Type 2 diabetes mellitus with diabetic chronic kidney disease: Secondary | ICD-10-CM | POA: Diagnosis present

## 2020-01-12 DIAGNOSIS — I132 Hypertensive heart and chronic kidney disease with heart failure and with stage 5 chronic kidney disease, or end stage renal disease: Secondary | ICD-10-CM | POA: Diagnosis present

## 2020-01-12 DIAGNOSIS — I255 Ischemic cardiomyopathy: Secondary | ICD-10-CM | POA: Diagnosis present

## 2020-01-12 DIAGNOSIS — Z20822 Contact with and (suspected) exposure to covid-19: Secondary | ICD-10-CM | POA: Diagnosis present

## 2020-01-12 DIAGNOSIS — J810 Acute pulmonary edema: Secondary | ICD-10-CM | POA: Diagnosis not present

## 2020-01-12 DIAGNOSIS — Z89412 Acquired absence of left great toe: Secondary | ICD-10-CM

## 2020-01-12 DIAGNOSIS — L8990 Pressure ulcer of unspecified site, unspecified stage: Secondary | ICD-10-CM | POA: Insufficient documentation

## 2020-01-12 DIAGNOSIS — Z833 Family history of diabetes mellitus: Secondary | ICD-10-CM

## 2020-01-12 DIAGNOSIS — N185 Chronic kidney disease, stage 5: Secondary | ICD-10-CM | POA: Diagnosis not present

## 2020-01-12 DIAGNOSIS — Z818 Family history of other mental and behavioral disorders: Secondary | ICD-10-CM

## 2020-01-12 DIAGNOSIS — R0602 Shortness of breath: Secondary | ICD-10-CM | POA: Diagnosis present

## 2020-01-12 DIAGNOSIS — Z89411 Acquired absence of right great toe: Secondary | ICD-10-CM

## 2020-01-12 DIAGNOSIS — S91301A Unspecified open wound, right foot, initial encounter: Secondary | ICD-10-CM | POA: Diagnosis present

## 2020-01-12 DIAGNOSIS — F329 Major depressive disorder, single episode, unspecified: Secondary | ICD-10-CM | POA: Diagnosis present

## 2020-01-12 DIAGNOSIS — Z801 Family history of malignant neoplasm of trachea, bronchus and lung: Secondary | ICD-10-CM

## 2020-01-12 DIAGNOSIS — Z7401 Bed confinement status: Secondary | ICD-10-CM

## 2020-01-12 DIAGNOSIS — Z7189 Other specified counseling: Secondary | ICD-10-CM | POA: Diagnosis not present

## 2020-01-12 DIAGNOSIS — I10 Essential (primary) hypertension: Secondary | ICD-10-CM | POA: Diagnosis not present

## 2020-01-12 DIAGNOSIS — M7989 Other specified soft tissue disorders: Secondary | ICD-10-CM

## 2020-01-12 LAB — BLOOD GAS, VENOUS
Acid-base deficit: 4.5 mmol/L — ABNORMAL HIGH (ref 0.0–2.0)
Bicarbonate: 21.7 mmol/L (ref 20.0–28.0)
FIO2: 0.36
O2 Saturation: 54.6 %
Patient temperature: 37
pCO2, Ven: 43 mmHg — ABNORMAL LOW (ref 44.0–60.0)
pH, Ven: 7.31 (ref 7.250–7.430)
pO2, Ven: 32 mmHg (ref 32.0–45.0)

## 2020-01-12 LAB — CBC WITH DIFFERENTIAL/PLATELET
Abs Immature Granulocytes: 0.09 10*3/uL — ABNORMAL HIGH (ref 0.00–0.07)
Basophils Absolute: 0.1 10*3/uL (ref 0.0–0.1)
Basophils Relative: 1 %
Eosinophils Absolute: 0.1 10*3/uL (ref 0.0–0.5)
Eosinophils Relative: 1 %
HCT: 29.9 % — ABNORMAL LOW (ref 39.0–52.0)
Hemoglobin: 9.3 g/dL — ABNORMAL LOW (ref 13.0–17.0)
Immature Granulocytes: 1 %
Lymphocytes Relative: 4 %
Lymphs Abs: 0.4 10*3/uL — ABNORMAL LOW (ref 0.7–4.0)
MCH: 29.9 pg (ref 26.0–34.0)
MCHC: 31.1 g/dL (ref 30.0–36.0)
MCV: 96.1 fL (ref 80.0–100.0)
Monocytes Absolute: 0.6 10*3/uL (ref 0.1–1.0)
Monocytes Relative: 6 %
Neutro Abs: 9.2 10*3/uL — ABNORMAL HIGH (ref 1.7–7.7)
Neutrophils Relative %: 87 %
Platelets: 288 10*3/uL (ref 150–400)
RBC: 3.11 MIL/uL — ABNORMAL LOW (ref 4.22–5.81)
RDW: 17.2 % — ABNORMAL HIGH (ref 11.5–15.5)
WBC: 10.4 10*3/uL (ref 4.0–10.5)
nRBC: 0 % (ref 0.0–0.2)

## 2020-01-12 LAB — COMPREHENSIVE METABOLIC PANEL
ALT: 13 U/L (ref 0–44)
AST: 18 U/L (ref 15–41)
Albumin: 2.4 g/dL — ABNORMAL LOW (ref 3.5–5.0)
Alkaline Phosphatase: 122 U/L (ref 38–126)
Anion gap: 12 (ref 5–15)
BUN: 52 mg/dL — ABNORMAL HIGH (ref 8–23)
CO2: 22 mmol/L (ref 22–32)
Calcium: 7.4 mg/dL — ABNORMAL LOW (ref 8.9–10.3)
Chloride: 102 mmol/L (ref 98–111)
Creatinine, Ser: 6.6 mg/dL — ABNORMAL HIGH (ref 0.61–1.24)
GFR calc Af Amer: 10 mL/min — ABNORMAL LOW (ref >60)
GFR calc non Af Amer: 8 mL/min — ABNORMAL LOW (ref >60)
Glucose, Bld: 146 mg/dL — ABNORMAL HIGH (ref 70–99)
Potassium: 4.1 mmol/L (ref 3.5–5.1)
Sodium: 136 mmol/L (ref 135–145)
Total Bilirubin: 0.8 mg/dL (ref 0.3–1.2)
Total Protein: 6.2 g/dL — ABNORMAL LOW (ref 6.5–8.1)

## 2020-01-12 LAB — TROPONIN I (HIGH SENSITIVITY): Troponin I (High Sensitivity): 60 ng/L — ABNORMAL HIGH (ref <18)

## 2020-01-12 LAB — SARS CORONAVIRUS 2 BY RT PCR (HOSPITAL ORDER, PERFORMED IN ~~LOC~~ HOSPITAL LAB): SARS Coronavirus 2: NEGATIVE

## 2020-01-12 LAB — BRAIN NATRIURETIC PEPTIDE: B Natriuretic Peptide: 3262 pg/mL — ABNORMAL HIGH (ref 0.0–100.0)

## 2020-01-12 MED ORDER — FUROSEMIDE 10 MG/ML IJ SOLN
80.0000 mg | Freq: Once | INTRAMUSCULAR | Status: AC
  Administered 2020-01-12: 80 mg via INTRAVENOUS
  Filled 2020-01-12: qty 8

## 2020-01-12 MED ORDER — ONDANSETRON HCL 4 MG/2ML IJ SOLN
4.0000 mg | Freq: Four times a day (QID) | INTRAMUSCULAR | Status: DC | PRN
  Filled 2020-01-12: qty 2

## 2020-01-12 MED ORDER — ACETAMINOPHEN 650 MG RE SUPP: 650.0000 mg | Freq: Four times a day (QID) | RECTAL | Status: DC | PRN

## 2020-01-12 MED ORDER — ONDANSETRON HCL 4 MG PO TABS: 4.0000 mg | ORAL_TABLET | Freq: Four times a day (QID) | ORAL | Status: DC | PRN

## 2020-01-12 MED ORDER — HEPARIN SODIUM (PORCINE) 5000 UNIT/ML IJ SOLN
5000.0000 [IU] | Freq: Three times a day (TID) | INTRAMUSCULAR | Status: DC
  Administered 2020-01-13 – 2020-01-21 (×22): 5000 [IU] via SUBCUTANEOUS
  Filled 2020-01-12 (×22): qty 1

## 2020-01-12 MED ORDER — SODIUM CHLORIDE 0.9% FLUSH
3.0000 mL | Freq: Two times a day (BID) | INTRAVENOUS | Status: DC
  Administered 2020-01-13 – 2020-01-21 (×17): 3 mL via INTRAVENOUS

## 2020-01-12 MED ORDER — ACETAMINOPHEN 325 MG PO TABS
650.0000 mg | ORAL_TABLET | Freq: Four times a day (QID) | ORAL | Status: DC | PRN
  Administered 2020-01-13 – 2020-01-16 (×4): 650 mg via ORAL
  Filled 2020-01-12 (×5): qty 2

## 2020-01-12 MED ORDER — ALBUMIN HUMAN 5 % IV SOLN
12.5000 g | Freq: Once | INTRAVENOUS | Status: AC
  Administered 2020-01-13: 12.5 g via INTRAVENOUS
  Filled 2020-01-12 (×2): qty 250

## 2020-01-12 NOTE — ED Provider Notes (Signed)
[name] EMERGENCY DEPARTMENT Provider Note   CSN: [id number] Arrival date & time: 01/12/20  2054     History Chief Complaint  Patient presents with  . Shortness of Breath    Scott [name] is a 59 y.o. male hx of CHF (EF 35%), ESRD on peritoneal dialysis here presenting with shortness of breath, abdominal distention.  Patient states that he has been worsening shortness of breath for the last 2 weeks.  Patient states that he has been having peritoneal dialysis for several years and takes about 1.5 L every day. Patient states that despite that, he has been very short of breath. He was started on Lasix 80 mg daily about a week ago with no improvement. He states that he still urinates occasionally.  Patient states that his dialysate is not cloudy and has no abdominal pain or fever.  He states that he had both of his Covid shots.  He was noted to be hypoxic 80% on room air.  The history is provided by the patient.       Past Medical History:  Diagnosis Date  . Acute respiratory failure with hypoxia ([place]) 09/17/2018  . Arthritis    "left arm; right leg" (12/13/2014)  . Asthma   . CHF (congestive heart failure) ([place])   . Chronic disease anemia    [name] 12/13/2014  . Chronic kidney disease (CKD), stage IV (severe) ([place])    [name] 12/13/2014... on dialysis  . Complication of anesthesia    unable to urinate after CAPD urgery  . Continuous ambulatory peritoneal dialysis status ([place])   . Coronary artery disease    [name] 12/13/2014  . Depression   . Dysrhythmia    patient unaware of irregular heartbeat  . GERD (gastroesophageal reflux disease)   . High cholesterol    [name] 12/13/2014  . Hypertension   . Non-Q wave myocardial infarction ([place])    [name] 12/13/2014  . PVD (peripheral vascular disease) ([place])    [name] 12/13/2014  . Type II diabetes mellitus [facility])     Patient Active Problem List   Diagnosis Date Noted  . Generalized (acute) peritonitis ([place])  10/09/2019  . Generalized abdominal pain 10/02/2019  . Leg pain, right 07/31/2019  . PAD (peripheral artery disease) ([place]) 07/17/2019  . Osteomyelitis of second toe of left foot ([place]) 07/17/2019  . Type II diabetes mellitus ([place])   . PVD (peripheral vascular disease) ([place])   . Non-Q wave myocardial infarction ([place])   . Hypertension   . Coronary artery disease   . Ulcerated, foot, right, with necrosis of muscle ([place])   . Atherosclerosis of artery of extremity with ulceration ([place]) 07/03/2019  . Anemia 04/15/2019  . Cellulitis 01/21/2019  . History of colonic polyps   . Positive fecal occult blood test   . 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 Atherosclerotic heart disease of native coronary artery without angina pectoris 06/02/2017  . Encounter for screening for depression 06/02/2017  . Gastro-esophageal reflux disease with esophagitis 06/02/2017  . Other asthma 06/02/2017  . Other specified arthritis, unspecified site 06/02/2017  . Acidosis 05/31/2017  . Anemia in ESRD (end-stage renal disease) ([place]) 05/31/2017  . Hyperkalemia 05/31/2017  . Iron deficiency anemia, unspecified 05/31/2017  . Liver disease, unspecified 05/31/2017  . Moderate protein-calorie malnutrition ([place]) 05/31/2017  . Other dietary vitamin B12 deficiency anemia 05/31/2017  . Other disorders of electrolyte and fluid balance, not elsewhere classified 05/31/2017  . Other disorders  resulting from impaired renal tubular function 05/31/2017  . Other hyperlipidemia 05/31/2017  . Other long term (current) drug therapy 05/31/2017  . Secondary hyperparathyroidism of renal origin ([place]) 05/31/2017  . Unspecified jaundice 05/31/2017  . ESRD (end stage renal disease) ([place]) 11/25/2016  . Tobacco abuse 11/25/2016  . Non-ST elevation (NSTEMI) myocardial infarction ([place]) 10/21/2016  . Pure hypercholesterolemia 10/21/2016  . Type 2 diabetes mellitus with other diabetic  kidney complication ([place]) 40/81/4481  . GERD (gastroesophageal reflux disease) 04/04/2015    Past Surgical History:  Procedure Laterality Date  . AMPUTATION Right 08/08/2019   Procedure: AMPUTATION RAY GREAT TOE;  Surgeon: Sharlotte Alamo, DPM;  Location: [facility];  Service: Podiatry;  Laterality: Right;  . AMPUTATION TOE Left 05/24/2019   Procedure: Left great toe amputation;  Surgeon: Caroline More, DPM;  Location: [facility];  Service: Podiatry;  Laterality: Left;  . AMPUTATION TOE Left 07/19/2019   Procedure: AMPUTATION 2nd TOE PARTIAL RAY RESECTION;  Surgeon: Sharlotte Alamo, DPM;  Location: [facility];  Service: Podiatry;  Laterality: Left;  . AV FISTULA PLACEMENT Right 04/07/2017   Procedure: ARTERIOVENOUS (AV) FISTULA CREATION ( BRACHIALCEPHALIC );  Surgeon: Katha Cabal, MD;  Location: [facility];  Service: Vascular;  Laterality: Right;  . CAPD INSERTION N/A 04/07/2017   Procedure: LAPAROSCOPIC INSERTION CONTINUOUS AMBULATORY PERITONEAL DIALYSIS  (CAPD) CATHETER;  Surgeon: Katha Cabal, MD;  Location: [facility];  Service: Vascular;  Laterality: N/A;  . CARDIAC CATHETERIZATION  11/2014  . CENTRAL LINE INSERTION-TUNNELED N/A 08/11/2019   Procedure: CENTRAL LINE INSERTION-TUNNELED ([place]);  Surgeon: Katha Cabal, MD;  Location: [facility];  Service: Cardiovascular;  Laterality: N/A;  . COLONOSCOPY WITH PROPOFOL N/A 12/01/2017   Procedure: COLONOSCOPY WITH PROPOFOL;  Surgeon: Lin Landsman, MD;  Location: [facility];  Service: Endoscopy;  Laterality: N/A;  Diabetic - insulin  . COLONOSCOPY WITH PROPOFOL N/A 12/29/2017   Procedure: COLONOSCOPY WITH PROPOFOL;  Surgeon: Lin Landsman, MD;  Location: [facility];  Service: Endoscopy;  Laterality: N/A;  . DIALYSIS/PERMA CATHETER INSERTION N/A 01/18/2017   Procedure: Dialysis/Perma Catheter Insertion;  Surgeon: Algernon Huxley, MD;  Location: [facility];  Service: Cardiovascular;  Laterality: N/A;  .  DIALYSIS/PERMA CATHETER REMOVAL N/A 07/26/2017   Procedure: DIALYSIS/PERMA CATHETER REMOVAL;  Surgeon: Algernon Huxley, MD;  Location: [facility];  Service: Cardiovascular;  Laterality: N/A;  . DIALYSIS/PERMA CATHETER REMOVAL N/A 10/24/2019   Procedure: DIALYSIS/PERMA CATHETER REMOVAL;  Surgeon: Katha Cabal, MD;  Location: [facility];  Service: Cardiovascular;  Laterality: N/A;  . INCISION AND DRAINAGE OF WOUND Right ~ 10/2014   "4th toe foot"  . IRRIGATION AND DEBRIDEMENT FOOT Left 07/19/2019   Procedure: IRRIGATION AND DEBRIDEMENT FOOT;  Surgeon: Sharlotte Alamo, DPM;  Location: [facility];  Service: Podiatry;  Laterality: Left;  . LOWER EXTREMITY ANGIOGRAPHY Left 05/23/2019   Procedure: Lower Extremity Angiography;  Surgeon: Katha Cabal, MD;  Location: [place] [facility] [facility];  Service: Cardiovascular;  Laterality: Left;  . LOWER EXTREMITY ANGIOGRAPHY Left 07/18/2019   Procedure: LOWER EXTREMITY ANGIOGRAPHY;  Surgeon: Katha Cabal, MD;  Location: [facility];  Service: Cardiovascular;  Laterality: Left;  . LOWER EXTREMITY ANGIOGRAPHY Right 08/01/2019   Procedure: LOWER EXTREMITY ANGIOGRAPHY;  Surgeon: Katha Cabal, MD;  Location: [place];  Service: Cardiovascular;  Laterality: Right;  . LOWER EXTREMITY ANGIOGRAPHY Right 08/04/2019   Procedure: Lower Extremity Angiography (Pedal Approach);  Surgeon: Katha Cabal,  MD;  Location: [place] [facility];  Service: Cardiovascular;  Laterality: Right;  . PERCUTANEOUS CORONARY STENT INTERVENTION (PCI-S) N/A 12/14/2014   Procedure: PERCUTANEOUS CORONARY STENT INTERVENTION (PCI-S);  Surgeon: Charolette Forward, MD;  Location: [facility] C[facility] LAB;  Service: Cardiovascular;  Laterality: N/A;  . PERCUTANEOUS CORONARY STENT INTERVENTION (PCI-S) N/A 12/18/2014   Procedure: PERCUTANEOUS CORONARY STENT INTERVENTION (PCI-S);  Surgeon: Charolette Forward, MD;  Location: [facility] CATH LAB;  Service: Cardiovascular;  Laterality: N/A;   . POLYPECTOMY  12/01/2017   Procedure: POLYPECTOMY;  Surgeon: Lin Landsman, MD;  Location: [facility];  Service: Endoscopy;;  . POLYPECTOMY  12/29/2017   Procedure: POLYPECTOMY;  Surgeon: Lin Landsman, MD;  Location: [facility];  Service: Endoscopy;;  . TOE AMPUTATION Right 2015   4th toe       Family History  Problem Relation Age of Onset  . Cancer Mother        Lung  . Cancer Father        Lung  . Diabetes Sister   . Diabetes Brother   . CAD Brother   . Hernia Son   . Cancer Brother     Social History   Tobacco Use  . Smoking status: Former Smoker    Packs/day: 1.00    Years: 30.00    Pack years: 30.00    Types: Cigarettes    Start date: 08/31/1984    Quit date: 11/30/2014    Years since quitting: 5.1  . Smokeless tobacco: Never Used  Substance Use Topics  . Alcohol use: Not Currently  . Drug use: No    Home Medications Prior to Admission medications   Medication Sig Start Date End Date Taking? Authorizing Provider  Accu-Chek FastClix Lancets MISC USE TO CHECK GLUCOSE THREE TIMES DAILY 03/20/19   [provider]  acetaminophen (TYLENOL) 500 MG tablet Take 500-1,000 mg by mouth every 6 (six) hours as needed for mild pain or headache.    [provider]  albuterol (PROVENTIL HFA;VENTOLIN HFA) 108 (90 BASE) MCG/ACT inhaler Inhale 2 puffs into the lungs every 6 (six) hours as needed for wheezing or shortness of breath. 01/10/15   Gladstone Lighter, MD  aspirin EC 81 MG tablet Take 1 tablet (81 mg total) by mouth daily. 07/21/19   Ivor Costa, MD  atorvastatin (LIPITOR) 40 MG tablet Take 40 mg by mouth daily. 05/15/19   [provider]  B Complex-C-Folic Acid (DIALYVITE 426) 0.8 MG TABS Take 1 tablet by mouth daily. 10/25/17   [provider]  calcium acetate (PHOSLO) 667 MG capsule Take 667-1,334 mg by mouth See admin instructions. Take 1334 mg by mouth 3 times daily with meals and 667 mg with snacks 06/08/17    [provider]  Calcium Carbonate Antacid (TUMS CHEWY DELIGHTS) 1177 MG CHEW Chew 1 tablet by mouth daily as needed (Reflux).  06/24/18   [provider]  Cholecalciferol (VITAMIN D) 2000 units tablet Take 2,000 Units by mouth daily.     [provider]  clopidogrel (PLAVIX) 75 MG tablet Take 1 tablet (75 mg total) by mouth daily with breakfast. Patient not taking: Reported on 10/17/2019 05/27/19   Dustin Flock, MD  furosemide (LASIX) 80 MG tablet Take 80 mg by mouth daily. 12/01/18   [provider]  insulin aspart protamine- aspart (NOVOLOG MIX 70/30) (70-30) 100 UNIT/ML injection Inject 0.25 mLs (25 Units total) into the skin 2 (two) times daily with a meal. Patient taking differently: Inject 2 Units into  the skin 2 (two) times daily as needed (blood sugar of 140 or higher).  03/12/18   Fritzi Mandes, MD  midodrine (PROAMATINE) 5 MG tablet Take 1 tablet (5 mg total) by mouth 3 (three) times daily with meals. Patient not taking: Reported on 10/17/2019 08/12/19   Kathie Dike, MD  mupirocin ointment (BACTROBAN) 2 % Place 1 application into the nose 2 (two) times daily. Patient not taking: Reported on 10/17/2019 07/21/19   Ivor Costa, MD  nitroGLYCERIN (NITROSTAT) 0.4 MG SL tablet Place 0.4 mg under the tongue every 5 (five) minutes as needed for chest pain. Pt needs new Rx. Bottle has expried    [provider]  nystatin (MYCOSTATIN) 100000 UNIT/ML suspension Take 5 mLs by mouth 3 (three) times daily. 10/07/19   [provider]  omeprazole (PRILOSEC) 20 MG capsule Take 1 capsule (20 mg total) by mouth daily. 01/10/15   Gladstone Lighter, MD  polyethylene glycol (MIRALAX / GLYCOLAX) 17 g packet Take 17 g by mouth daily as needed. 08/12/19   Kathie Dike, MD  sulfamethoxazole-trimethoprim (BACTRIM) 400-80 MG tablet Take 1 tablet by mouth daily. Patient not taking: Reported on 10/17/2019 08/12/19   Kathie Dike, MD  tamsulosin (FLOMAX) 0.4 MG  CAPS capsule Take 1 capsule (0.4 mg total) by mouth daily. Patient not taking: Reported on 10/17/2019 06/19/19   Hollice Espy, MD  traMADol Veatrice [place]bon) 50 MG tablet One to Two Tabs By Mouth Every Six Hours As Needed For Pain Patient not taking: Reported on 10/17/2019 09/19/19   Stegmayer, Janalyn Harder, PA-C  vancomycin (VANCOCIN) 10 G SOLR injection Inject 10 g into the vein every other day.  08/16/19   [provider]  vitamin C (VITAMIN C) 500 MG tablet Take 1 tablet (500 mg total) by mouth 2 (two) times daily. Patient not taking: Reported on 10/17/2019 07/21/19   Ivor Costa, MD    Allergies    Patient has no known allergies.  Review of Systems   Review of Systems  Respiratory: Positive for shortness of breath.   All other systems reviewed and are negative.   Physical Exam Updated Vital Signs BP (!) 118/56 (BP Location: Left Arm)   Pulse (!) 101   Temp 98.5 F (36.9 C) (Oral)   Resp (!) 26   Ht 5\' 9"  (1.753 m)   Wt 102.1 kg   SpO2 95%   BMI 33.23 kg/m   Physical Exam Vitals and nursing note reviewed.  Constitutional:      Comments: Chronically ill, tachypneic   HENT:     Head: Normocephalic.  Eyes:     Extraocular Movements: Extraocular movements intact.     Pupils: Pupils are equal, round, and reactive to light.  Cardiovascular:     Rate and Rhythm: Regular rhythm. Tachycardia present.  Pulmonary:     Comments: Crackles bilateral bases  Abdominal:     Comments: Distended, PD catheter in place   Musculoskeletal:     Cervical back: Normal range of motion and neck supple.     Comments: 2+ edema   Skin:    General: Skin is warm.     Capillary Refill: Capillary refill takes less than 2 seconds.  Neurological:     General: No focal deficit present.     Mental Status: He is alert and oriented to person, place, and time.  Psychiatric:        Mood and Affect: Mood normal.        Behavior: Behavior normal.  ED Results / Procedures / Treatments   Labs (all  labs ordered are listed, but only abnormal results are displayed) Labs Reviewed  CBC WITH DIFFERENTIAL/PLATELET - Abnormal; Notable for the following components:      Result Value   RBC 3.11 (*)    Hemoglobin 9.3 (*)    HCT 29.9 (*)    RDW 17.2 (*)    Neutro Abs 9.2 (*)    Lymphs Abs 0.4 (*)    Abs Immature Granulocytes 0.09 (*)    All other components within normal limits  COMPREHENSIVE METABOLIC PANEL - Abnormal; Notable for the following components:   Glucose, Bld 146 (*)    BUN 52 (*)    Creatinine, Ser 6.60 (*)    Calcium 7.4 (*)    Total Protein 6.2 (*)    Albumin 2.4 (*)    GFR calc non Af Amer 8 (*)    GFR calc Af Amer 10 (*)    All other components within normal limits  BRAIN NATRIURETIC PEPTIDE - Abnormal; Notable for the following components:   B Natriuretic Peptide 3,262.0 (*)    All other components within normal limits  BLOOD GAS, VENOUS - Abnormal; Notable for the following components:   pCO2, Ven 43 (*)    Acid-base deficit 4.5 (*)    All other components within normal limits  TROPONIN I (HIGH SENSITIVITY) - Abnormal; Notable for the following components:   Troponin I (High Sensitivity) 60 (*)    All other components within normal limits  SARS CORONAVIRUS 2 BY RT PCR (HOSPITAL ORDER, [facility])    EKG None  Radiology DG Chest Port 1 View  Result Date: 01/12/2020 CLINICAL DATA:  Shortness of breath EXAM: PORTABLE CHEST 1 VIEW COMPARISON:  September 17, 2018 FINDINGS: Again noted is marked cardiomegaly. There is diffusely increased interstitial markings seen throughout both lungs. A moderate right pleural effusion is noted. No acute osseous abnormality. IMPRESSION: Moderate cardiomegaly and diffuse interstitial edema Moderate right pleural effusion Electronically Signed   By: Prudencio Pair M.D.   On: 01/12/2020 21:19    Procedures Procedures (including critical care time)  CRITICAL CARE Performed by: Wandra Arthurs   Total  critical care time: 30 minutes  Critical care time was exclusive of separately billable procedures and treating other patients.  Critical care was necessary to treat or prevent imminent or life-threatening deterioration.  Critical care was time spent personally by me on the following activities: development of treatment plan with patient and/or surrogate as well as nursing, discussions with consultants, evaluation of patient's response to treatment, examination of patient, obtaining history from patient or surrogate, ordering and performing treatments and interventions, ordering and review of laboratory studies, ordering and review of radiographic studies, pulse oximetry and re-evaluation of patient's condition.   Medications Ordered in ED Medications  furosemide (LASIX) injection 80 mg (has no administration in time range)  albumin human 5 % solution 12.5 g (has no administration in time range)    ED Course  I have reviewed the triage vital signs and the nursing notes.  Pertinent labs & imaging results that were available during my care of the patient were reviewed by me and considered in my medical decision making (see chart for details).    MDM Rules/Calculators/A&P                     Scott [name] is a 59 y.o. male who presented with shortness of breath.  Patient is peritoneal dialysis and also has EF of 30%.  Concern for either fluid overload from CHF or from inadequate dialysis.  Will check CBC, CMP, BNP, chest x-ray.  Will need admission since he is hypoxic.  10:24 PM His labs showed BNP of 3000.  His chemistries at baseline his potassium is normal.  Chest x-ray showed interstitial edema.  I discussed case with his nephrologist, Dr. Juleen China.  He recommend Lasix 80 IV with albumin.  He will continue peritoneal dialysis and will discuss options with him in the morning including hemodialysis.  Hospitalist to admit for respiratory failure with hypoxia secondary to CHF and ESRD.      Final Clinical Impression(s) / ED Diagnoses Final diagnoses:  None    Rx / DC Orders ED Discharge Orders    None       Drenda Freeze, MD 01/12/20 2224

## 2020-01-12 NOTE — ED Triage Notes (Signed)
Pt arrives to ED via [facility] with c/o [facility] x2 weeks. Pt is a home, daily PD patient with h/x of CHF. Pt was recently started on Lasix 80mg  daily. Pt  Reports non-productive cough; denies N/V/D. EMS reports pt was found to have O2 sats of 80% on RA (pt does not require home O2 use). Pt is A&O, in NAD; RR even, regular, and unlabored.

## 2020-01-12 NOTE — H&P (Signed)
History and Physical    Scott [name] HDQ:[id number] DOB: 01-Dec-1960 DOA: 01/12/2020  PCP: [facility] [employer]  Patient coming from: Home via EMS  I have personally briefly reviewed patient's old medical records in [place]  Chief Complaint: Shortness of breath  HPI: Scott [name] is a 59 y.o. male with medical history significant for ESRD on peritoneal dialysis, chronic systolic CHF (EF 11-94% by TTE 09/18/2018), CAD, insulin-dependent type 2 diabetes, anemia of chronic disease, hypertension, hyperlipidemia, peripheral vascular disease who presents to the ED for evaluation of progressive shortness of breath.  Patient states he has been having progressive shortness of breath over the last 2 weeks.  He has been continuing peritoneal dialysis daily at home and feels like he has been having adequate output.  He says he still does make a small amount of urine.  He has not noticed any significant change in baseline swelling in his lower extremities or abdomen.  He denies any subjective fevers, chills, diaphoresis, chest pain, productive cough.  He reportedly was recently started on Lasix on an outpatient basis.  ED Course:  Initial vitals showed BP 118/56, pulse 102, RR 35, temp 98.5 Fahrenheit, SPO2 80% on room air per EDP.  Patient placed on 4 L supplemental O2 via [place] with improved SPO2 to 95%.  Labs are notable for WBC 10.4, hemoglobin 9.3, platelets 288,000, sodium 136, potassium 4.1, bicarb 22, BUN 52, creatinine 6.6, albumin 2.4, LFTs within normal limits, BNP 3262, high-sensitivity troponin I 60.  VBG showed pH 7.31, PCO2 43, PO2 32.  SARS-CoV-2 PCR is negative.  Portable chest x-ray shows enlarged cardiac silhouette with diffuse interstitial edema and moderate right pleural effusion.  EDP discussed the case with on-call nephrology who recommended giving IV Lasix 80 mg with IV albumin 12.5 g once and medical admission.  The hospitalist service was consulted to admit  for further evaluation management.  Review of Systems: All systems reviewed and are negative except as documented in history of present illness above.   Past Medical History:  Diagnosis Date  . Acute respiratory failure with hypoxia ([place]) 09/17/2018  . Arthritis    "left arm; right leg" (12/13/2014)  . Asthma   . CHF (congestive heart failure) ([place])   . Chronic disease anemia    [name] 12/13/2014  . Chronic kidney disease (CKD), stage IV (severe) ([place])    [name] 12/13/2014... on dialysis  . Complication of anesthesia    unable to urinate after CAPD urgery  . Continuous ambulatory peritoneal dialysis status ([place])   . Coronary artery disease    [name] 12/13/2014  . Depression   . Dysrhythmia    patient unaware of irregular heartbeat  . GERD (gastroesophageal reflux disease)   . High cholesterol    [name] 12/13/2014  . Hypertension   . Non-Q wave myocardial infarction ([place])    [name] 12/13/2014  . PVD (peripheral vascular disease) ([place])    [name] 12/13/2014  . Type II diabetes mellitus ([place])     Past Surgical History:  Procedure Laterality Date  . AMPUTATION Right 08/08/2019   Procedure: AMPUTATION RAY GREAT TOE;  Surgeon: Sharlotte Alamo, DPM;  Location: [facility];  Service: Podiatry;  Laterality: Right;  . AMPUTATION TOE Left 05/24/2019   Procedure: Left great toe amputation;  Surgeon: Caroline More, DPM;  Location: [facility];  Service: Podiatry;  Laterality: Left;  . AMPUTATION TOE Left 07/19/2019   Procedure: AMPUTATION 2nd TOE PARTIAL RAY RESECTION;  Surgeon: Sharlotte Alamo, DPM;  Location:  [facility];  Service: Podiatry;  Laterality: Left;  . AV FISTULA PLACEMENT Right 04/07/2017   Procedure: ARTERIOVENOUS (AV) FISTULA CREATION ( BRACHIALCEPHALIC );  Surgeon: Katha Cabal, MD;  Location: [facility];  Service: Vascular;  Laterality: Right;  . CAPD INSERTION N/A 04/07/2017   Procedure: LAPAROSCOPIC INSERTION CONTINUOUS AMBULATORY PERITONEAL DIALYSIS  (CAPD) CATHETER;  Surgeon: Katha Cabal, MD;  Location: [facility];  Service: Vascular;  Laterality: N/A;  . CARDIAC CATHETERIZATION  11/2014  . CENTRAL LINE INSERTION-TUNNELED N/A 08/11/2019   Procedure: CENTRAL LINE INSERTION-TUNNELED ([place]);  Surgeon: Katha Cabal, MD;  Location: [place] [facility];  Service: Cardiovascular;  Laterality: N/A;  . COLONOSCOPY WITH PROPOFOL N/A 12/01/2017   Procedure: COLONOSCOPY WITH PROPOFOL;  Surgeon: Lin Landsman, MD;  Location: [place];  Service: Endoscopy;  Laterality: N/A;  Diabetic - insulin  . COLONOSCOPY WITH PROPOFOL N/A 12/29/2017   Procedure: COLONOSCOPY WITH PROPOFOL;  Surgeon: Lin Landsman, MD;  Location: [place];  Service: Endoscopy;  Laterality: N/A;  . DIALYSIS/PERMA CATHETER INSERTION N/A 01/18/2017   Procedure: Dialysis/Perma Catheter Insertion;  Surgeon: Algernon Huxley, MD;  Location: [place] [facility];  Service: Cardiovascular;  Laterality: N/A;  . DIALYSIS/PERMA CATHETER REMOVAL N/A 07/26/2017   Procedure: DIALYSIS/PERMA CATHETER REMOVAL;  Surgeon: Algernon Huxley, MD;  Location: [place] [facility];  Service: Cardiovascular;  Laterality: N/A;  . DIALYSIS/PERMA CATHETER REMOVAL N/A 10/24/2019   Procedure: DIALYSIS/PERMA CATHETER REMOVAL;  Surgeon: Katha Cabal, MD;  Location: [place] [facility];  Service: Cardiovascular;  Laterality: N/A;  . INCISION AND DRAINAGE OF WOUND Right ~ 10/2014   "4th toe foot"  . IRRIGATION AND DEBRIDEMENT FOOT Left 07/19/2019   Procedure: IRRIGATION AND DEBRIDEMENT FOOT;  Surgeon: Sharlotte Alamo, DPM;  Location: [facility];  Service: Podiatry;  Laterality: Left;  . LOWER EXTREMITY ANGIOGRAPHY Left 05/23/2019   Procedure: Lower Extremity Angiography;  Surgeon: Katha Cabal, MD;  Location: [place] [facility];  Service: Cardiovascular;  Laterality: Left;  . LOWER EXTREMITY ANGIOGRAPHY Left 07/18/2019   Procedure: LOWER EXTREMITY ANGIOGRAPHY;  Surgeon: Katha Cabal, MD;  Location: [place] [facility];  Service: Cardiovascular;  Laterality: Left;  . LOWER EXTREMITY ANGIOGRAPHY Right 08/01/2019   Procedure: LOWER EXTREMITY ANGIOGRAPHY;  Surgeon: Katha Cabal, MD;  Location: [place] [facility];  Service: Cardiovascular;  Laterality: Right;  . LOWER EXTREMITY ANGIOGRAPHY Right 08/04/2019   Procedure: Lower Extremity Angiography (Pedal Approach);  Surgeon: Katha Cabal, MD;  Location: [place] [facility];  Service: Cardiovascular;  Laterality: Right;  . PERCUTANEOUS CORONARY STENT INTERVENTION (PCI-S) N/A 12/14/2014   Procedure: PERCUTANEOUS CORONARY STENT INTERVENTION (PCI-S);  Surgeon: Charolette Forward, MD;  Location: [facility];  Service: Cardiovascular;  Laterality: N/A;  . PERCUTANEOUS CORONARY STENT INTERVENTION (PCI-S) N/A 12/18/2014   Procedure: PERCUTANEOUS CORONARY STENT INTERVENTION (PCI-S);  Surgeon: Charolette Forward, MD;  Location: [facility] CATH LAB;  Service: Cardiovascular;  Laterality: N/A;  . POLYPECTOMY  12/01/2017   Procedure: POLYPECTOMY;  Surgeon: Lin Landsman, MD;  Location: [place];  Service: Endoscopy;;  . POLYPECTOMY  12/29/2017   Procedure: POLYPECTOMY;  Surgeon: Lin Landsman, MD;  Location: [place];  Service: Endoscopy;;  . TOE AMPUTATION Right 2015   4th toe    Social History:  reports that he quit smoking about 5 years ago. His smoking use included cigarettes. He started smoking about 35 years ago. He has a 30.00 pack-year smoking history. He  has never used smokeless tobacco. He reports previous alcohol use. He reports that he does not use drugs.  No Known Allergies  Family History  Problem Relation Age of Onset  . Cancer Mother        Lung  . Cancer Father        Lung  . Diabetes Sister   . Diabetes Brother   . CAD Brother   . Hernia Son   . Cancer Brother      Prior to Admission medications   Medication Sig Start Date End Date Taking? Authorizing Provider  Accu-Chek FastClix Lancets MISC USE TO CHECK  GLUCOSE THREE TIMES DAILY 03/20/19   [provider]  acetaminophen (TYLENOL) 500 MG tablet Take 500-1,000 mg by mouth every 6 (six) hours as needed for mild pain or headache.    [provider]  albuterol (PROVENTIL HFA;VENTOLIN HFA) 108 (90 BASE) MCG/ACT inhaler Inhale 2 puffs into the lungs every 6 (six) hours as needed for wheezing or shortness of breath. 01/10/15   Gladstone Lighter, MD  aspirin EC 81 MG tablet Take 1 tablet (81 mg total) by mouth daily. 07/21/19   Ivor Costa, MD  atorvastatin (LIPITOR) 40 MG tablet Take 40 mg by mouth daily. 05/15/19   [provider]  B Complex-C-Folic Acid (DIALYVITE 332) 0.8 MG TABS Take 1 tablet by mouth daily. 10/25/17   [provider]  calcium acetate (PHOSLO) 667 MG capsule Take 667-1,334 mg by mouth See admin instructions. Take 1334 mg by mouth 3 times daily with meals and 667 mg with snacks 06/08/17   [provider]  Calcium Carbonate Antacid (TUMS CHEWY DELIGHTS) 1177 MG CHEW Chew 1 tablet by mouth daily as needed (Reflux).  06/24/18   [provider]  Cholecalciferol (VITAMIN D) 2000 units tablet Take 2,000 Units by mouth daily.     [provider]  clopidogrel (PLAVIX) 75 MG tablet Take 1 tablet (75 mg total) by mouth daily with breakfast. Patient not taking: Reported on 10/17/2019 05/27/19   Dustin Flock, MD  furosemide (LASIX) 80 MG tablet Take 80 mg by mouth daily. 12/01/18   [provider]  insulin aspart protamine- aspart (NOVOLOG MIX 70/30) (70-30) 100 UNIT/ML injection Inject 0.25 mLs (25 Units total) into the skin 2 (two) times daily with a meal. Patient taking differently: Inject 2 Units into the skin 2 (two) times daily as needed (blood sugar of 140 or higher).  03/12/18   Fritzi Mandes, MD  midodrine (PROAMATINE) 5 MG tablet Take 1 tablet (5 mg total) by mouth 3 (three) times daily with meals. Patient not taking: Reported on 10/17/2019 08/12/19   Kathie Dike, MD   mupirocin ointment (BACTROBAN) 2 % Place 1 application into the nose 2 (two) times daily. Patient not taking: Reported on 10/17/2019 07/21/19   Ivor Costa, MD  nitroGLYCERIN (NITROSTAT) 0.4 MG SL tablet Place 0.4 mg under the tongue every 5 (five) minutes as needed for chest pain. Pt needs new Rx. Bottle has expried    [provider]  nystatin (MYCOSTATIN) 100000 UNIT/ML suspension Take 5 mLs by mouth 3 (three) times daily. 10/07/19   [provider]  omeprazole (PRILOSEC) 20 MG capsule Take 1 capsule (20 mg total) by mouth daily. 01/10/15   Gladstone Lighter, MD  polyethylene glycol (MIRALAX / GLYCOLAX) 17 g packet Take 17 g by mouth daily as needed. 08/12/19   Kathie Dike, MD  sulfamethoxazole-trimethoprim (BACTRIM) 400-80 MG tablet Take 1 tablet by mouth daily. Patient not  taking: Reported on 10/17/2019 08/12/19   Kathie Dike, MD  tamsulosin (FLOMAX) 0.4 MG CAPS capsule Take 1 capsule (0.4 mg total) by mouth daily. Patient not taking: Reported on 10/17/2019 06/19/19   Hollice Espy, MD  traMADol Veatrice [place]) 50 MG tablet One to Two Tabs By Mouth Every Six Hours As Needed For Pain Patient not taking: Reported on 10/17/2019 09/19/19   Stegmayer, Janalyn Harder, PA-C  vancomycin (VANCOCIN) 10 G SOLR injection Inject 10 g into the vein every other day.  08/16/19   [provider]  vitamin C (VITAMIN C) 500 MG tablet Take 1 tablet (500 mg total) by mouth 2 (two) times daily. Patient not taking: Reported on 10/17/2019 07/21/19   Ivor Costa, MD    Physical Exam: Vitals:   01/12/20 2101 01/12/20 2105 01/12/20 2106  BP:  (!) 118/56   Pulse:  (!) 101   Resp:  (!) 26   Temp:  98.5 F (36.9 C)   TempSrc:  Oral   SpO2: 98% 95%   Weight:   102.1 kg  Height:   5\' 9"  (1.753 m)   Constitutional: Obese man resting supine in bed, NAD, calm, comfortable Eyes: PERRL, lids and conjunctivae normal ENMT: Mucous membranes are moist. Posterior pharynx clear of any exudate or  lesions. Neck: normal, supple, no masses. Respiratory: Bilateral inspiratory crackles. Normal respiratory effort. No accessory muscle use.  Cardiovascular: Regular rate and rhythm, no murmurs / rubs / gallops.  +2 bilateral lower extremity edema. Abdomen: PD catheter in place.  Distended nontense abdomen, no tenderness, no masses palpated. No hepatosplenomegaly. Bowel sounds positive.  Musculoskeletal: no clubbing / cyanosis. No joint deformity upper and lower extremities. Good ROM, no contractures. Normal muscle tone.  Skin: no rashes, lesions, ulcers. No induration Neurologic: CN 2-12 grossly intact. Sensation intact, moving all extremities equally. Psychiatric: Normal judgment and insight. Alert and oriented x 3. Normal mood.     Labs on Admission: I have personally reviewed following labs and imaging studies  CBC: Recent Labs  Lab 01/12/20 2119  WBC 10.4  NEUTROABS 9.2*  HGB 9.3*  HCT 29.9*  MCV 96.1  PLT 540   Basic Metabolic Panel: Recent Labs  Lab 01/12/20 2119  NA 136  K 4.1  CL 102  CO2 22  GLUCOSE 146*  BUN 52*  CREATININE 6.60*  CALCIUM 7.4*   GFR: Estimated Creatinine Clearance: 13.8 mL/min (A) (by C-G formula based on SCr of 6.6 mg/dL (H)). Liver Function Tests: Recent Labs  Lab 01/12/20 2119  AST 18  ALT 13  ALKPHOS 122  BILITOT 0.8  PROT 6.2*  ALBUMIN 2.4*   No results for input(s): LIPASE, AMYLASE in the last 168 hours. No results for input(s): AMMONIA in the last 168 hours. Coagulation Profile: No results for input(s): INR, PROTIME in the last 168 hours. Cardiac Enzymes: No results for input(s): CKTOTAL, CKMB, CKMBINDEX, TROPONINI in the last 168 hours. BNP (last 3 results) No results for input(s): PROBNP in the last 8760 hours. HbA1C: No results for input(s): HGBA1C in the last 72 hours. CBG: No results for input(s): GLUCAP in the last 168 hours. Lipid Profile: No results for input(s): CHOL, HDL, LDLCALC, TRIG, CHOLHDL, LDLDIRECT in  the last 72 hours. Thyroid Function Tests: No results for input(s): TSH, T4TOTAL, FREET4, T3FREE, THYROIDAB in the last 72 hours. Anemia Panel: No results for input(s): VITAMINB12, FOLATE, FERRITIN, TIBC, IRON, RETICCTPCT in the last 72 hours. Urine analysis:    Component Value Date/Time   COLORURINE YELLOW 06/19/2019  [place] 06/19/2019 [place] 12/03/2014 0751   LABSPEC 1.020 06/19/2019 1202   LABSPEC 1.011 12/03/2014 0751   PHURINE 6.0 06/19/2019 1202   GLUCOSEU 100 (A) 06/19/2019 1202   GLUCOSEU 50 mg/dL 12/03/2014 0751   HGBUR MODERATE (A) 06/19/2019 1202   BILIRUBINUR NEGATIVE 06/19/2019 1202   BILIRUBINUR Negative 12/03/2014 [place] 06/19/2019 1202   PROTEINUR 100 (A) 06/19/2019 1202   NITRITE NEGATIVE 06/19/2019 1202   LEUKOCYTESUR NEGATIVE 06/19/2019 1202   LEUKOCYTESUR Negative 12/03/2014 0751    Radiological Exams on Admission: DG Chest Port 1 View  Result Date: 01/12/2020 CLINICAL DATA:  Shortness of breath EXAM: PORTABLE CHEST 1 VIEW COMPARISON:  September 17, 2018 FINDINGS: Again noted is marked cardiomegaly. There is diffusely increased interstitial markings seen throughout both lungs. A moderate right pleural effusion is noted. No acute osseous abnormality. IMPRESSION: Moderate cardiomegaly and diffuse interstitial edema Moderate right pleural effusion Electronically Signed   By: Prudencio Pair M.D.   On: 01/12/2020 21:19    EKG: Ordered and pending.  Assessment/Plan Principal Problem:   Acute respiratory failure with hypoxia (HCC) Active Problems:   Type 2 diabetes mellitus with other diabetic kidney complication (HCC)   Acute on chronic systolic CHF (congestive heart failure) (HCC)   Anemia in ESRD (end-stage renal disease) (HCC)   PVD (peripheral vascular disease) ([place])   Hypertension associated with diabetes ([place])   Coronary artery disease   ESRD on peritoneal dialysis ([place])   Hyperlipidemia associated  with type 2 diabetes mellitus ([place])  Scott [name] is a 59 y.o. male with medical history significant for ESRD on peritoneal dialysis, chronic systolic CHF (EF 77-82% by TTE 09/18/2018), CAD, insulin-dependent type 2 diabetes, anemia of chronic disease, hypertension, hyperlipidemia, peripheral vascular disease who is admitted with acute respiratory failure with hypoxia due to acute on chronic systolic CHF in setting of ESRD on PD.  Acute respiratory failure with hypoxia due to acute on chronic systolic CHF in setting of ESRD on peritoneal dialysis: Worsening peripheral edema and pulmonary edema despite daily peritoneal dialysis at home. Nephrology were consulted and recommended IV Lasix 80 mg with albumin which were given in the ED. Patient states he still makes a little amount of urine. -Nephrology to follow for further inpatient PD versus transition to hemodialysis for further volume removal -Monitor strict I/O's and daily weights  CAD: Denies any chest pain. High-sensitivity troponin I mildly elevated at 60. Suspect demand from acute on chronic CHF. EKG is pending. Repeat troponin is pending. -Continue aspirin and statin  Anemia of chronic disease: Chronic and appears stable without obvious bleeding. Continue monitor.  Insulin-dependent type 2 diabetes: Place on sensitive SSI while in hospital and adjust as needed.  Hypertension: Not currently on antihypertensives. BP is borderline low. Can place on midodrine if needed.  Peripheral vascular disease: Continue aspirin and statin.  Hyperlipidemia: Continue atorvastatin.   DVT prophylaxis: Subcutaneous heparin Code Status: Full code, confirmed with patient Family Communication: Discussed with patient, he has discussed with family Disposition Plan: From home, discharge pending further volume removal with PD/HD per nephrology Consults called: Nephrology Admission status:  Status is: Inpatient  Remains inpatient appropriate  because:Inpatient level of care appropriate due to severity of illness as he requires further volume removal in hospital with PD versus HD per nephrology.  Dispo: The patient is from: Home              Anticipated d/c is to: Home  Anticipated d/c date is: 3 days pending adequate volume removal with PD versus HD per nephrology.              Patient currently is not medically stable to d/c.    Zada Finders MD [facility]  If 7PM-7AM, please contact night-coverage [email or web address]  01/12/2020, 10:38 PM

## 2020-01-12 NOTE — ED Notes (Signed)
[place]

## 2020-01-13 ENCOUNTER — Other Ambulatory Visit: Payer: Self-pay

## 2020-01-13 ENCOUNTER — Encounter: Payer: Self-pay | Admitting: Internal Medicine

## 2020-01-13 DIAGNOSIS — L8990 Pressure ulcer of unspecified site, unspecified stage: Secondary | ICD-10-CM | POA: Insufficient documentation

## 2020-01-13 DIAGNOSIS — I2510 Atherosclerotic heart disease of native coronary artery without angina pectoris: Secondary | ICD-10-CM

## 2020-01-13 DIAGNOSIS — I5023 Acute on chronic systolic (congestive) heart failure: Secondary | ICD-10-CM

## 2020-01-13 LAB — BASIC METABOLIC PANEL
Anion gap: 12 (ref 5–15)
BUN: 52 mg/dL — ABNORMAL HIGH (ref 8–23)
CO2: 22 mmol/L (ref 22–32)
Calcium: 7.2 mg/dL — ABNORMAL LOW (ref 8.9–10.3)
Chloride: 102 mmol/L (ref 98–111)
Creatinine, Ser: 6.63 mg/dL — ABNORMAL HIGH (ref 0.61–1.24)
GFR calc Af Amer: 10 mL/min — ABNORMAL LOW (ref >60)
GFR calc non Af Amer: 8 mL/min — ABNORMAL LOW (ref >60)
Glucose, Bld: 124 mg/dL — ABNORMAL HIGH (ref 70–99)
Potassium: 3.7 mmol/L (ref 3.5–5.1)
Sodium: 136 mmol/L (ref 135–145)

## 2020-01-13 LAB — CBC
HCT: 27.6 % — ABNORMAL LOW (ref 39.0–52.0)
Hemoglobin: 8.6 g/dL — ABNORMAL LOW (ref 13.0–17.0)
MCH: 30.3 pg (ref 26.0–34.0)
MCHC: 31.2 g/dL (ref 30.0–36.0)
MCV: 97.2 fL (ref 80.0–100.0)
Platelets: 268 10*3/uL (ref 150–400)
RBC: 2.84 MIL/uL — ABNORMAL LOW (ref 4.22–5.81)
RDW: 17 % — ABNORMAL HIGH (ref 11.5–15.5)
WBC: 8.5 10*3/uL (ref 4.0–10.5)
nRBC: 0 % (ref 0.0–0.2)

## 2020-01-13 LAB — TROPONIN I (HIGH SENSITIVITY)
Troponin I (High Sensitivity): 63 ng/L — ABNORMAL HIGH (ref <18)
Troponin I (High Sensitivity): 49 ng/L — ABNORMAL HIGH (ref <18)

## 2020-01-13 LAB — CREATININE, SERUM
Creatinine, Ser: 6.68 mg/dL — ABNORMAL HIGH (ref 0.61–1.24)
GFR calc Af Amer: 9 mL/min — ABNORMAL LOW (ref >60)
GFR calc non Af Amer: 8 mL/min — ABNORMAL LOW (ref >60)

## 2020-01-13 MED ORDER — ATORVASTATIN CALCIUM 20 MG PO TABS
40.0000 mg | ORAL_TABLET | Freq: Every day | ORAL | Status: DC
  Administered 2020-01-13 – 2020-01-21 (×8): 40 mg via ORAL
  Filled 2020-01-13 (×8): qty 2

## 2020-01-13 MED ORDER — FUROSEMIDE 10 MG/ML IJ SOLN
80.0000 mg | Freq: Two times a day (BID) | INTRAMUSCULAR | Status: DC
  Filled 2020-01-13: qty 8

## 2020-01-13 MED ORDER — SODIUM CHLORIDE 0.9 % IV SOLN
INTRAVENOUS | Status: DC | PRN
  Administered 2020-01-13: 250 mL via INTRAVENOUS

## 2020-01-13 MED ORDER — PANTOPRAZOLE SODIUM 40 MG PO TBEC
40.0000 mg | DELAYED_RELEASE_TABLET | Freq: Every day | ORAL | Status: DC
  Administered 2020-01-13 – 2020-01-21 (×8): 40 mg via ORAL
  Filled 2020-01-13 (×8): qty 1

## 2020-01-13 MED ORDER — CHLORHEXIDINE GLUCONATE CLOTH 2 % EX PADS
6.0000 | MEDICATED_PAD | Freq: Every day | CUTANEOUS | Status: DC
  Administered 2020-01-14 – 2020-01-21 (×8): 6 via TOPICAL

## 2020-01-13 MED ORDER — ASPIRIN EC 81 MG PO TBEC
81.0000 mg | DELAYED_RELEASE_TABLET | Freq: Every day | ORAL | Status: DC
  Administered 2020-01-13 – 2020-01-21 (×8): 81 mg via ORAL
  Filled 2020-01-13 (×9): qty 1

## 2020-01-13 NOTE — Consult Note (Signed)
[facility] Vascular Consult Note  MRN : [id number]  Scott [name] is a 59 y.o. ([birth year]) male who presents with chief complaint of ESRD.  Chief Complaint  Patient presents with  . Shortness of Breath  .  History of Present Illness: Patient is admitted to the hospital with ESRD. He has PD catheter that he has used for dialysis, however, it has stopped working and the patient now needs urgent HD. The nephrology service has decided to initiate dialysis at this time, and we are asked to place a temporary dialysis catheter for immediate dialysis use.  He will require Permanent HD access early next week. The patient also has severe, nonreconstructable PVD and bilateral foot wounds. We have seen him outpatient in the past and noted he had no reconstructable options to improve his perfusion. At that time we recommended amputation.  Current Facility-Administered Medications  Medication Dose Route Frequency Provider Last Rate Last Admin  . 0.9 %  sodium chloride infusion   Intravenous PRN Lenore Cordia, MD   Stopped at 01/13/20 0231  . acetaminophen (TYLENOL) tablet 650 mg  650 mg Oral Q6H PRN Lenore Cordia, MD   650 mg at 01/13/20 9833   Or  . acetaminophen (TYLENOL) suppository 650 mg  650 mg Rectal Q6H PRN Lenore Cordia, MD      . aspirin EC tablet 81 mg  81 mg Oral Daily Lenore Cordia, MD   81 mg at 01/13/20 0905  . atorvastatin (LIPITOR) tablet 40 mg  40 mg Oral Daily Zada Finders R, MD   40 mg at 01/13/20 0905  . furosemide (LASIX) injection 80 mg  80 mg Intravenous BID Kolluru, Sarath, MD      . heparin injection 5,000 Units  5,000 Units Subcutaneous Q8H Lenore Cordia, MD   5,000 Units at 01/13/20 0704  . ondansetron (ZOFRAN) tablet 4 mg  4 mg Oral Q6H PRN Lenore Cordia, MD       Or  . ondansetron (ZOFRAN) injection 4 mg  4 mg Intravenous Q6H PRN Zada Finders R, MD      . pantoprazole (PROTONIX) EC tablet 40 mg  40 mg Oral Daily Lenore Cordia, MD    40 mg at 01/13/20 0905  . sodium chloride flush (NS) 0.9 % injection 3 mL  3 mL Intravenous Q12H Lenore Cordia, MD   3 mL at 01/13/20 8250    Past Medical History:  Diagnosis Date  . Acute respiratory failure with hypoxia ([place]) 09/17/2018  . Arthritis    "left arm; right leg" (12/13/2014)  . Asthma   . CHF (congestive heart failure) ([place])   . Chronic disease anemia    [name] 12/13/2014  . Chronic kidney disease (CKD), stage IV (severe) ([place])    [name] 12/13/2014... on dialysis  . Complication of anesthesia    unable to urinate after CAPD urgery  . Continuous ambulatory peritoneal dialysis status ([place])   . Coronary artery disease    [name] 12/13/2014  . Depression   . Dysrhythmia    patient unaware of irregular heartbeat  . GERD (gastroesophageal reflux disease)   . High cholesterol    [name] 12/13/2014  . Hypertension   . Non-Q wave myocardial infarction ([place])    [name] 12/13/2014  . PVD (peripheral vascular disease) ([place])    [name] 12/13/2014  . Type II diabetes mellitus ([place])     Past Surgical History:  Procedure Laterality Date  . AMPUTATION Right 08/08/2019  Procedure: AMPUTATION RAY GREAT TOE;  Surgeon: Sharlotte Alamo, DPM;  Location: [facility];  Service: Podiatry;  Laterality: Right;  . AMPUTATION TOE Left 05/24/2019   Procedure: Left great toe amputation;  Surgeon: Caroline More, DPM;  Location: [facility];  Service: Podiatry;  Laterality: Left;  . AMPUTATION TOE Left 07/19/2019   Procedure: AMPUTATION 2nd TOE PARTIAL RAY RESECTION;  Surgeon: Sharlotte Alamo, DPM;  Location: [facility];  Service: Podiatry;  Laterality: Left;  . AV FISTULA PLACEMENT Right 04/07/2017   Procedure: ARTERIOVENOUS (AV) FISTULA CREATION ( BRACHIALCEPHALIC );  Surgeon: Katha Cabal, MD;  Location: [facility];  Service: Vascular;  Laterality: Right;  . CAPD INSERTION N/A 04/07/2017   Procedure: LAPAROSCOPIC INSERTION CONTINUOUS AMBULATORY PERITONEAL DIALYSIS  (CAPD) CATHETER;  Surgeon: Katha Cabal,  MD;  Location: [facility];  Service: Vascular;  Laterality: N/A;  . CARDIAC CATHETERIZATION  11/2014  . CENTRAL LINE INSERTION-TUNNELED N/A 08/11/2019   Procedure: CENTRAL LINE INSERTION-TUNNELED ([place]);  Surgeon: Katha Cabal, MD;  Location: [place] [facility];  Service: Cardiovascular;  Laterality: N/A;  . COLONOSCOPY WITH PROPOFOL N/A 12/01/2017   Procedure: COLONOSCOPY WITH PROPOFOL;  Surgeon: Lin Landsman, MD;  Location: [place];  Service: Endoscopy;  Laterality: N/A;  Diabetic - insulin  . COLONOSCOPY WITH PROPOFOL N/A 12/29/2017   Procedure: COLONOSCOPY WITH PROPOFOL;  Surgeon: Lin Landsman, MD;  Location: [place];  Service: Endoscopy;  Laterality: N/A;  . DIALYSIS/PERMA CATHETER INSERTION N/A 01/18/2017   Procedure: Dialysis/Perma Catheter Insertion;  Surgeon: Algernon Huxley, MD;  Location: [place] [facility];  Service: Cardiovascular;  Laterality: N/A;  . DIALYSIS/PERMA CATHETER REMOVAL N/A 07/26/2017   Procedure: DIALYSIS/PERMA CATHETER REMOVAL;  Surgeon: Algernon Huxley, MD;  Location: [place] [facility];  Service: Cardiovascular;  Laterality: N/A;  . DIALYSIS/PERMA CATHETER REMOVAL N/A 10/24/2019   Procedure: DIALYSIS/PERMA CATHETER REMOVAL;  Surgeon: Katha Cabal, MD;  Location: [place] [facility];  Service: Cardiovascular;  Laterality: N/A;  . INCISION AND DRAINAGE OF WOUND Right ~ 10/2014   "4th toe foot"  . IRRIGATION AND DEBRIDEMENT FOOT Left 07/19/2019   Procedure: IRRIGATION AND DEBRIDEMENT FOOT;  Surgeon: Sharlotte Alamo, DPM;  Location: [facility];  Service: Podiatry;  Laterality: Left;  . LOWER EXTREMITY ANGIOGRAPHY Left 05/23/2019   Procedure: Lower Extremity Angiography;  Surgeon: Katha Cabal, MD;  Location: [place] [facility];  Service: Cardiovascular;  Laterality: Left;  . LOWER EXTREMITY ANGIOGRAPHY Left 07/18/2019   Procedure: LOWER EXTREMITY ANGIOGRAPHY;  Surgeon: Katha Cabal, MD;  Location: [place] [facility];   Service: Cardiovascular;  Laterality: Left;  . LOWER EXTREMITY ANGIOGRAPHY Right 08/01/2019   Procedure: LOWER EXTREMITY ANGIOGRAPHY;  Surgeon: Katha Cabal, MD;  Location: [place] [facility];  Service: Cardiovascular;  Laterality: Right;  . LOWER EXTREMITY ANGIOGRAPHY Right 08/04/2019   Procedure: Lower Extremity Angiography (Pedal Approach);  Surgeon: Katha Cabal, MD;  Location: [place] [facility];  Service: Cardiovascular;  Laterality: Right;  . PERCUTANEOUS CORONARY STENT INTERVENTION (PCI-S) N/A 12/14/2014   Procedure: PERCUTANEOUS CORONARY STENT INTERVENTION (PCI-S);  Surgeon: Charolette Forward, MD;  Location: [facility] C[facility];  Service: Cardiovascular;  Laterality: N/A;  . PERCUTANEOUS CORONARY STENT INTERVENTION (PCI-S) N/A 12/18/2014   Procedure: PERCUTANEOUS CORONARY STENT INTERVENTION (PCI-S);  Surgeon: Charolette Forward, MD;  Location: [facility] C[facility];  Service: Cardiovascular;  Laterality: N/A;  . POLYPECTOMY  12/01/2017   Procedure: POLYPECTOMY;  Surgeon: Lin Landsman, MD;  Location: [place];  Service: Endoscopy;;  . POLYPECTOMY  12/29/2017   Procedure: POLYPECTOMY;  Surgeon: Lin Landsman, MD;  Location: [place];  Service: Endoscopy;;  . TOE AMPUTATION Right 2015   4th toe    Social History Social History   Tobacco Use  . Smoking status: Former Smoker    Packs/day: 1.00    Years: 30.00    Pack years: 30.00    Types: Cigarettes    Start date: 08/31/1984    Quit date: 11/30/2014    Years since quitting: 5.1  . Smokeless tobacco: Never Used  Substance Use Topics  . Alcohol use: Not Currently  . Drug use: No    Family History Family History  Problem Relation Age of Onset  . Cancer Mother        Lung  . Cancer Father        Lung  . Diabetes Sister   . Diabetes Brother   . CAD Brother   . Hernia Son   . Cancer Brother     No Known Allergies   REVIEW OF SYSTEMS (Negative unless checked)  Constitutional: [] Weight loss  [] Fever   [] Chills Cardiac: [] Chest pain   [] Chest pressure   [] Palpitations   [] Shortness of breath when laying flat   [] Shortness of breath at rest   [] Shortness of breath with exertion. Vascular:  [] Pain in legs with walking   [] Pain in legs at rest   [] Pain in legs when laying flat   [] Claudication   [] Pain in feet when walking  [] Pain in feet at rest  [] Pain in feet when laying flat   [] History of DVT   [] Phlebitis   [] Swelling in legs   [] Varicose veins   [] Non-healing ulcers Pulmonary:   [x] Uses home oxygen   [] Productive cough   [] Hemoptysis   [] Wheeze  [x] COPD   [] Asthma Neurologic:  [] Dizziness  [] Blackouts   [] Seizures   [] History of stroke   [] History of TIA  [] Aphasia   [] Temporary blindness   [] Dysphagia   [] Weakness or numbness in arms   [] Weakness or numbness in legs Musculoskeletal:  [] Arthritis   [] Joint swelling   [] Joint pain   [] Low back pain Hematologic:  [] Easy bruising  [] Easy bleeding   [] Hypercoagulable state   [] Anemic  [] Hepatitis Gastrointestinal:  [] Blood in stool   [] Vomiting blood  [] Gastroesophageal reflux/heartburn   [] Difficulty swallowing. Genitourinary:  [] Chronic kidney disease   [] Difficult urination  [] Frequent urination  [] Burning with urination   [] Blood in urine Skin:  [] Rashes   [] Ulcers   [] Wounds Psychological:  [] History of anxiety   []  History of major depression.  Unable to obtain the review of systems due to the patient's severe systemic illness and altered mental status.       Physical Examination  Vitals:   01/12/20 2345 01/13/20 0312 01/13/20 0654 01/13/20 0808  BP: 94/64 116/62  (!) 85/38  Pulse: 91 82  75  Resp: (!) 22 (!) 22  19  Temp: 98.1 F (36.7 C) 97.9 F (36.6 C)  98.6 F (37 C)  TempSrc: Oral Oral  Oral  SpO2: 100% 100%  (!) 89%  Weight: 109.5 kg  111.3 kg   Height: 5\' 9"  (1.753 m)      Body mass index is 36.24 kg/m. Gen: WD/WN Head: [place]/AT, No temporalis wasting Ear/Nose/Throat: Hearing grossly intact, nares w/o erythema or  drainage Eyes: Sclera non-icteric, conjunctiva clear Neck: Supple, no nuchal rigidity.  No JVD.  Pulmonary:  Good air movement, rhonchi bilaterally.  Cardiac: RRR, normal S1, S2, no Murmurs, rubs or gallops. Vascular: + femoral pulses, lower extremities with Gastrointestinal: soft, non-tender/distended. No guarding/reflex.  Musculoskeletal: M/S 5/5 throughout.  Extremities warm, bilateral feet wrapped. Edema in the lower extremities bilaterally  Psychiatric: Difficult to assess due to the severity of patient's illness. Dermatologic: No rashes or ulcers noted.   Lymph : No Cervical, Axillary, or Inguinal lymphadenopathy.      CBC Lab Results  Component Value Date   WBC 8.5 01/13/2020   HGB 8.6 (L) 01/13/2020   HCT 27.6 (L) 01/13/2020   MCV 97.2 01/13/2020   PLT 268 01/13/2020    BMET    Component Value Date/Time   NA 136 01/13/2020 0028   NA 140 04/16/2015 0000   NA 140 12/13/2014 0102   K 3.7 01/13/2020 0028   K 4.6 12/13/2014 0102   CL 102 01/13/2020 0028   CL 111 12/13/2014 0102   CO2 22 01/13/2020 0028   CO2 23 12/13/2014 0102   GLUCOSE 124 (H) 01/13/2020 0028   GLUCOSE 213 (H) 12/13/2014 0102   BUN 52 (H) 01/13/2020 0028   BUN 44 (A) 04/16/2015 0000   BUN 25 (H) 12/13/2014 0102   CREATININE 6.63 (H) 01/13/2020 0028   CREATININE 1.95 (H) 12/13/2014 0102   CALCIUM 7.2 (L) 01/13/2020 0028   CALCIUM 8.0 (L) 12/13/2014 0102   GFRNONAA 8 (L) 01/13/2020 0028   GFRNONAA 37 (L) 12/13/2014 0102   GFRAA 10 (L) 01/13/2020 0028   GFRAA 43 (L) 12/13/2014 0102   Estimated Creatinine Clearance: 14.4 mL/min (A) (by C-G formula based on SCr of 6.63 mg/dL (H)).  COAG Lab Results  Component Value Date   INR 1.3 (H) 08/01/2019   INR 1.1 07/31/2019   INR 1.2 07/17/2019    Radiology DG Chest Port 1 View  Result Date: 01/12/2020 CLINICAL DATA:  Shortness of breath EXAM: PORTABLE CHEST 1 VIEW COMPARISON:  September 17, 2018 FINDINGS: Again noted is marked cardiomegaly.  There is diffusely increased interstitial markings seen throughout both lungs. A moderate right pleural effusion is noted. No acute osseous abnormality. IMPRESSION: Moderate cardiomegaly and diffuse interstitial edema Moderate right pleural effusion Electronically Signed   By: Prudencio Pair M.D.   On: 01/12/2020 21:19      Assessment/Plan 1. ESRD 2. Failed PD Catheter Will Plan for Permanent Dialysis Catheter Placement next week. No reperfusion intervention recommended for his PVD; amputation is the only next option. Continue wound care per Podiatry.   We will proceed with temporary dialysis catheter placement at this time.  Risks and benefits discussed with patient and/or family, and the catheter will be placed to allow immediate initiation of dialysis.  If the patient's renal function does not improve throughout the hospital course, we will be happy to place a tunneled dialysis catheter for long term use prior to discharge.     Evaristo Bury, MD  01/13/2020 2:44 PM

## 2020-01-13 NOTE — Progress Notes (Signed)
Assumed care of patient this evening. Patient alert oriented and pleasant however has significant necrotic toe issues bilaterally. As well as a deep right heel wound. Pressure injury also noted of the coccyx and foam placed. Skin tear noted on the left forearm foam placed there are well. Patient seems to have significantly dry skin and itchy.  Patient would benefit greatly from a wound consult. States he lives at home with girlfriend and she has been caring for his wounds. States he also sees a wound DR about twice a month.  Patient does not appear to be in respiratory distress however is requiring 4 L per [place], that has been continued on the floor. Patients blood pressure continues to be soft as reflected from prior readings will continue to monitor.  Safety checks completed and call light placed within reach will continue to monitor.

## 2020-01-13 NOTE — Progress Notes (Signed)
Pt blood pressure ranged from 70's-90' entire tx.Asymptomatic. MD aware. 1531ml fluid removal.

## 2020-01-13 NOTE — Progress Notes (Signed)
[facility]  ROUNDING NOTE   Subjective:   [name] admitted to [facility] on 01/12/2020 for ESRD (end stage renal disease) on dialysis ([place]) [N18.6, Z99.2] Acute respiratory failure with hypoxia ([place]) [J96.01] Congestive heart failure, unspecified HF chronicity, unspecified heart failure type ([place]) [I50.9]  Patient states he continues to gain fluid despite doing his home peritoneal dialysis as directed. He has been using the highest dextrose solution with no results.  He is 12 kg above his estimated dry weight.   Objective:  Vital signs in last 24 hours:  Temp:  [97.9 F (36.6 C)-98.6 F (37 C)] 98.6 F (37 C) (05/15 0808) Pulse Rate:  [28-101] 75 (05/15 0808) Resp:  [19-26] 19 (05/15 0808) BP: (85-118)/(38-72) 85/38 (05/15 0808) SpO2:  [89 %-100 %] 89 % (05/15 0808) Weight:  [102.1 kg-111.3 kg] 111.3 kg (05/15 0654)  Weight change:  Filed Weights   01/12/20 2106 01/12/20 2345 01/13/20 0654  Weight: 102.1 kg 109.5 kg 111.3 kg    Intake/Output: I/O last 3 completed shifts: In: 44.3 [I.V.:17.6; IV Piggyback:26.8] Out: -    Intake/Output this shift:  Total I/O In: 3 [I.V.:3] Out: -   Physical Exam: General: NAD, sitting up in bed  Head: Normocephalic, atraumatic. Moist oral mucosal membranes  Eyes: Anicteric, PERRL  Neck: Supple, trachea midline  Lungs:  Bilateral crackles  Heart: Regular rate and rhythm  Abdomen:  Abdominal wall edema  Extremities:  +++ peripheral edema.  Neurologic: Nonfocal, moving all four extremities  Skin: No lesions  Access: Peritoneal dialysis catheter Right arm AVF - not functioning    Basic Metabolic Panel: Recent Labs  Lab 01/12/20 2119 01/13/20 0009 01/13/20 0028  NA 136  --  136  K 4.1  --  3.7  CL 102  --  102  CO2 22  --  22  GLUCOSE 146*  --  124*  BUN 52*  --  52*  CREATININE 6.60* 6.68* 6.63*  CALCIUM 7.4*  --  7.2*    Liver Function Tests: Recent Labs  Lab 01/12/20 2119  AST 18  ALT 13   ALKPHOS 122  BILITOT 0.8  PROT 6.2*  ALBUMIN 2.4*   No results for input(s): LIPASE, AMYLASE in the last 168 hours. No results for input(s): AMMONIA in the last 168 hours.  CBC: Recent Labs  Lab 01/12/20 2119 01/13/20 0028  WBC 10.4 8.5  NEUTROABS 9.2*  --   HGB 9.3* 8.6*  HCT 29.9* 27.6*  MCV 96.1 97.2  PLT 288 268    Cardiac Enzymes: No results for input(s): CKTOTAL, CKMB, CKMBINDEX, TROPONINI in the last 168 hours.  BNP: Invalid input(s): POCBNP  CBG: No results for input(s): GLUCAP in the last 168 hours.  Microbiology: Results for orders placed or performed during the hospital encounter of 01/12/20  SARS Coronavirus 2 by RT PCR (hospital order, performed in [facility] hospital lab) Nasopharyngeal Nasopharyngeal Swab     Status: None   Collection Time: 01/12/20  9:08 PM   Specimen: Nasopharyngeal Swab  Result Value Ref Range Status   SARS Coronavirus 2 NEGATIVE NEGATIVE Final    Comment: (NOTE) SARS-CoV-2 target nucleic acids are NOT DETECTED. The SARS-CoV-2 RNA is generally detectable in upper and lower respiratory specimens during the acute phase of infection. The lowest concentration of SARS-CoV-2 viral copies this assay can detect is 250 copies / mL. A negative result does not preclude SARS-CoV-2 infection and should not be used as the sole basis for treatment or other  patient management decisions.  A negative result may occur with improper specimen collection / handling, submission of specimen other than nasopharyngeal swab, presence of viral mutation(s) within the areas targeted by this assay, and inadequate number of viral copies (<250 copies / mL). A negative result must be combined with clinical observations, patient history, and epidemiological information. Fact Sheet for Patients:   StrictlyIdeas.no Fact Sheet for Healthcare Providers: BankingDealers.co.za This test is not yet approved or cleared  by  the [place] FDA and has been authorized for detection and/or diagnosis of SARS-CoV-2 by FDA under an Emergency Use Authorization (EUA).  This EUA will remain in effect (meaning this test can be used) for the duration of the COVID-19 declaration under Section 564(b)(1) of the Act, 21 U.S.C. section 360bbb-3(b)(1), unless the authorization is terminated or revoked sooner. Performed at [facility], [address] [place], [place] [address]     Coagulation Studies: No results for input(s): LABPROT, INR in the last 72 hours.  Urinalysis: No results for input(s): COLORURINE, LABSPEC, PHURINE, GLUCOSEU, HGBUR, BILIRUBINUR, KETONESUR, PROTEINUR, UROBILINOGEN, NITRITE, LEUKOCYTESUR in the last 72 hours.  Invalid input(s): APPERANCEUR    Imaging: DG Chest Port 1 View  Result Date: 01/12/2020 CLINICAL DATA:  Shortness of breath EXAM: PORTABLE CHEST 1 VIEW COMPARISON:  September 17, 2018 FINDINGS: Again noted is marked cardiomegaly. There is diffusely increased interstitial markings seen throughout both lungs. A moderate right pleural effusion is noted. No acute osseous abnormality. IMPRESSION: Moderate cardiomegaly and diffuse interstitial edema Moderate right pleural effusion Electronically Signed   By: Prudencio Pair M.D.   On: 01/12/2020 21:19     Medications:   . sodium chloride Stopped (01/13/20 0231)   . aspirin EC  81 mg Oral Daily  . atorvastatin  40 mg Oral Daily  . furosemide  80 mg Intravenous BID  . heparin  5,000 Units Subcutaneous Q8H  . pantoprazole  40 mg Oral Daily  . sodium chloride flush  3 mL Intravenous Q12H   sodium chloride, acetaminophen **OR** acetaminophen, ondansetron **OR** ondansetron (ZOFRAN) IV  Assessment/ Plan:  [name] is a 59 y.o. white male with end stage renal disease on peritoneal dialysis, hypertension, peripheral vascular disease, diabetes mellitus type II, asthma , left toe amputation who is admitted to [facility] on  01/12/2020 for ESRD (end stage renal disease) on dialysis ([place]) [N18.6, Z99.2] Acute respiratory failure with hypoxia ([place]) [J96.01] Congestive heart failure, unspecified HF chronicity, unspecified heart failure type ([place]) [I50.9]  [place] 99kg Outpatient Peritoneal dialysis CAPD 4 exchanges 2 liter fills  1. End Stage Renal Disease: patient is significantly volume overloaded and seem to have inadequate dialysis. His is 12 kg above his estimated dry weight of 99kg.  Will need back up hemodialysis today  - Consult vascular for hemodialysis access - Hemodialysis for later today - Will attempt peritoneal dialysis and drain fluid and send for cell count and cultures.   2. Hypertension: hypotensive currently. Holding home agents - IV furosemide  3. Anemia with chronic kidney disease: normocytic. Hemoglobin 8.6 - ESA with HD treatment  4. Secondary Hyperparathyroidism: with hyperphosphatemia and hypocalcemia - restart calcium acetate with meals.   5. Peripheral vascular disease:  - consult wound care, podiatry and vascular   LOS: 1 Bazil Dhanani 5/15/20211:10 PM

## 2020-01-13 NOTE — Progress Notes (Signed)
Patient signed consent for temporary cath placement by Dr. Lorenso Courier. Placed in patient chart.

## 2020-01-13 NOTE — Procedures (Signed)
  OPERATIVE NOTE   PROCEDURE: 1. Ultrasound guidance for vascular access RIGHT FEMORAL vein 2. Placement of a Temporary trialysis dialysis catheter Right femoral vein  PRE-OPERATIVE DIAGNOSIS: 1. End Stage renal Disease 2. Malfunctioning PD Catheter  POST-OPERATIVE DIAGNOSIS: Same  SURGEON: Jamesetta So, MD  ASSISTANT(S): None  ANESTHESIA: local  ESTIMATED BLOOD LOSS: Minimal   FINDING(S): 1. None  SPECIMEN(S): None  INDICATIONS:  Patient is a 59 y.o.male who presents with ESRD on HD via PD Catheter that has not been working. Now requires urgent HD access..  Risks and benefits were discussed, and informed consent was obtained..  DESCRIPTION: After obtaining full informed written consent, the patient was laid flat in the bed. The RIGHT groin was sterilely prepped and draped in a sterile surgical field was created. The Right Femoral vein was visualized with ultrasound and found to be widely patent. It was then accessed under direct guidance without difficulty with a [name] needle and a permanent image was recorded. A J-wire was then placed. After skin nick and dilatation, a 30cm Trialysis dialysis catheter was placed over the wire and the wire was removed. The lumens withdrew dark red nonpulsatile blood and flushed easily with sterile saline. The catheter was secured to the skin with 3 nylon sutures. Sterile dressing was placed.  COMPLICATIONS: None  CONDITION: Stable  Brahm Barbeau A 01/13/2020 2:40 PM  This note was created with Dragon Medical transcription system. Any errors in dictation are purely unintentional.

## 2020-01-13 NOTE — Progress Notes (Signed)
Dr. Manuella Ghazi aware of hypotension. No new orders at this time.

## 2020-01-13 NOTE — Progress Notes (Addendum)
1        [place] [facility] [place] NAME: Scott [name]    MR#:  [id number]  DATE OF BIRTH:  05/06/1961  SUBJECTIVE:  CHIEF COMPLAINT:   Chief Complaint  Patient presents with  . Shortness of Breath  Hypotensive, hypoxic.  Sitting in the chair.  Not feeling too well REVIEW OF SYSTEMS:  Review of Systems  Constitutional: Positive for malaise/fatigue. Negative for diaphoresis, fever and weight loss.  HENT: Negative for ear discharge, ear pain, hearing loss, nosebleeds, sore throat and tinnitus.   Eyes: Negative for blurred vision and pain.  Respiratory: Negative for cough, hemoptysis, shortness of breath and wheezing.   Cardiovascular: Positive for leg swelling. Negative for chest pain, palpitations and orthopnea.  Gastrointestinal: Negative for abdominal pain, blood in stool, constipation, diarrhea, heartburn, nausea and vomiting.  Genitourinary: Negative for dysuria, frequency and urgency.  Musculoskeletal: Negative for back pain and myalgias.  Skin: Negative for itching and rash.  Neurological: Negative for dizziness, tingling, tremors, focal weakness, seizures, weakness and headaches.  Psychiatric/Behavioral: Negative for depression. The patient is not nervous/anxious.    DRUG ALLERGIES:  No Known Allergies VITALS:  Blood pressure (!) 85/38, pulse 75, temperature 98.6 F (37 C), temperature source Oral, resp. rate 19, height 5\' 9"  (1.753 m), weight 111.3 kg, SpO2 (!) 89 %. PHYSICAL EXAMINATION:  Physical Exam HENT:     Head: Normocephalic and atraumatic.  Eyes:     Conjunctiva/sclera: Conjunctivae normal.     Pupils: Pupils are equal, round, and reactive to light.  Neck:     Thyroid: No thyromegaly.     Trachea: No tracheal deviation.  Cardiovascular:     Rate and Rhythm: Normal rate and regular rhythm.     Heart sounds: Normal heart sounds.  Pulmonary:     Effort: Pulmonary effort is normal. No respiratory distress.     Breath sounds: Examination  of the right-lower field reveals decreased breath sounds. Examination of the left-lower field reveals decreased breath sounds. Decreased breath sounds present. No wheezing.  Chest:     Chest wall: No tenderness.  Abdominal:     General: Bowel sounds are normal. There is distension.     Palpations: Abdomen is soft.     Tenderness: There is no abdominal tenderness.     Comments: PD catheter in place.  Musculoskeletal:        General: Normal range of motion.     Cervical back: Normal range of motion and neck supple.  Skin:    General: Skin is warm and dry.     Findings: No rash.  Neurological:     Mental Status: He is alert and oriented to person, place, and time.     Cranial Nerves: No cranial nerve deficit.    LABORATORY PANEL:  Male CBC Recent Labs  Lab 01/13/20 0028  WBC 8.5  HGB 8.6*  HCT 27.6*  PLT 268   ------------------------------------------------------------------------------------------------------------------ Chemistries  Recent Labs  Lab 01/12/20 2119 01/13/20 0009 01/13/20 0028  NA 136   < > 136  K 4.1   < > 3.7  CL 102   < > 102  CO2 22   < > 22  GLUCOSE 146*   < > 124*  BUN 52*   < > 52*  CREATININE 6.60*   < > 6.63*  CALCIUM 7.4*   < > 7.2*  AST 18  --   --   ALT 13  --   --  ALKPHOS 122  --   --   BILITOT 0.8  --   --    < > = values in this interval not displayed.   RADIOLOGY:  DG Chest Port 1 View  Result Date: 01/12/2020 CLINICAL DATA:  Shortness of breath EXAM: PORTABLE CHEST 1 VIEW COMPARISON:  September 17, 2018 FINDINGS: Again noted is marked cardiomegaly. There is diffusely increased interstitial markings seen throughout both lungs. A moderate right pleural effusion is noted. No acute osseous abnormality. IMPRESSION: Moderate cardiomegaly and diffuse interstitial edema Moderate right pleural effusion Electronically Signed   By: Prudencio Pair M.D.   On: 01/12/2020 21:19   ASSESSMENT AND PLAN:  BRNADON [name] is a 59 y.o. male with  medical history significant for ESRD on peritoneal dialysis, chronic systolic CHF (EF 44-92% by TTE 09/18/2018), CAD, insulin-dependent type 2 diabetes, anemia of chronic disease, hypertension, hyperlipidemia, peripheral vascular disease who is admitted with acute respiratory failure with hypoxia due to acute on chronic systolic CHF in setting of ESRD on PD.  Acute respiratory failure with hypoxia due to acute on chronic systolic CHF in setting of ESRD on peritoneal dialysis: Requiring 3 L oxygen via nasal cannula Worsening peripheral edema and pulmonary edema despite daily peritoneal dialysis at home. Nephrology planning to have hemodialysis as a backup peritoneal dialysis while here. -Monitor strict I/O's and daily weights  CAD: Denies any chest pain. High-sensitivity troponin I mildly elevated at 60. Suspect demand from acute on chronic CHF.  -Trend troponin  -Continue aspirin and statin  Anemia of chronic disease: Chronic and appears stable without obvious bleeding. Continue monitor.  Insulin-dependent type 2 diabetes: - sensitive SSI while in hospital and adjust as needed.  Hypertension: Not currently on antihypertensives. BP is borderline low. Can place on midodrine if needed.  Peripheral vascular disease: Continue aspirin and statin.  Hyperlipidemia: Continue atorvastatin   Status is: Inpatient  Remains inpatient appropriate because:Ongoing diagnostic testing needed not appropriate for outpatient work up   Dispo: The patient is from: Home              Anticipated d/c is to: Home              Anticipated d/c date is: > 3 days              Patient currently is not medically stable to d/c.    DVT prophylaxis: Heparin Family Communication: Discussed with patient   All the records are reviewed and case discussed with Care Management/Social Worker. Management plans discussed with the patient, nursing, nephrology and they are in agreement.  CODE STATUS: Full  Code  TOTAL TIME TAKING CARE OF THIS PATIENT: 35 minutes.   More than 50% of the time was spent in counseling/coordination of care: YES  POSSIBLE D/C IN 3-4 DAYS, DEPENDING ON CLINICAL CONDITION.   Max Sane M.D on 01/13/2020 at 12:59 PM  [facility]   CC: Primary care physician; [employer]  Note: This dictation was prepared with Diplomatic Services operational officer dictation along with smaller Company secretary. Any transcriptional errors that result from this process are unintentional.

## 2020-01-13 NOTE — Consult Note (Addendum)
PODIATRY / FOOT AND ANKLE SURGERY CONSULTATION NOTE  Requesting Physician: Max Sane MD  Reason for consult: Bilateral foot gangrene  Chief Complaint: Gangrene both feet   HPI: Scott [name] is a 59 y.o. male who presents currently getting dialysis treatment.  Patient was admitted to the hospital due to acute respiratory failure with hypoxia due to acute onset CHF.  He was admitted for further work-up and treatment.  Patient is treated as an outpatient by [name].  It has been discussed at the patient's previous visit that there is no further revascularization procedures that can be performed to help with his blood flow issues.  Patient is palliative care essentially at this point for his foot wounds to reduce chances for infection.  Patient is well aware that if anything becomes infected that he will likely need more proximal amputation.  PMHx:  Past Medical History:  Diagnosis Date  . Acute respiratory failure with hypoxia ([place]) 09/17/2018  . Arthritis    "left arm; right leg" (12/13/2014)  . Asthma   . CHF (congestive heart failure) ([place])   . Chronic disease anemia    [name] 12/13/2014  . Chronic kidney disease (CKD), stage IV (severe) ([place])    [name] 12/13/2014... on dialysis  . Complication of anesthesia    unable to urinate after CAPD urgery  . Continuous ambulatory peritoneal dialysis status ([place])   . Coronary artery disease    [name] 12/13/2014  . Depression   . Dysrhythmia    patient unaware of irregular heartbeat  . GERD (gastroesophageal reflux disease)   . High cholesterol    [name] 12/13/2014  . Hypertension   . Non-Q wave myocardial infarction ([place])    [name] 12/13/2014  . PVD (peripheral vascular disease) ([place])    [name] 12/13/2014  . Type II diabetes mellitus ([place])     Surgical Hx:  Past Surgical History:  Procedure Laterality Date  . AMPUTATION Right 08/08/2019   Procedure: AMPUTATION RAY GREAT TOE;  Surgeon: Sharlotte Alamo, DPM;  Location: [facility];  Service:  Podiatry;  Laterality: Right;  . AMPUTATION TOE Left 05/24/2019   Procedure: Left great toe amputation;  Surgeon: Caroline More, DPM;  Location: [facility];  Service: Podiatry;  Laterality: Left;  . AMPUTATION TOE Left 07/19/2019   Procedure: AMPUTATION 2nd TOE PARTIAL RAY RESECTION;  Surgeon: Sharlotte Alamo, DPM;  Location: [facility];  Service: Podiatry;  Laterality: Left;  . AV FISTULA PLACEMENT Right 04/07/2017   Procedure: ARTERIOVENOUS (AV) FISTULA CREATION ( BRACHIALCEPHALIC );  Surgeon: Katha Cabal, MD;  Location: [facility];  Service: Vascular;  Laterality: Right;  . CAPD INSERTION N/A 04/07/2017   Procedure: LAPAROSCOPIC INSERTION CONTINUOUS AMBULATORY PERITONEAL DIALYSIS  (CAPD) CATHETER;  Surgeon: Katha Cabal, MD;  Location: [facility];  Service: Vascular;  Laterality: N/A;  . CARDIAC CATHETERIZATION  11/2014  . CENTRAL LINE INSERTION-TUNNELED N/A 08/11/2019   Procedure: CENTRAL LINE INSERTION-TUNNELED ([place]);  Surgeon: Katha Cabal, MD;  Location: [place] [facility];  Service: Cardiovascular;  Laterality: N/A;  . COLONOSCOPY WITH PROPOFOL N/A 12/01/2017   Procedure: COLONOSCOPY WITH PROPOFOL;  Surgeon: Lin Landsman, MD;  Location: [place];  Service: Endoscopy;  Laterality: N/A;  Diabetic - insulin  . COLONOSCOPY WITH PROPOFOL N/A 12/29/2017   Procedure: COLONOSCOPY WITH PROPOFOL;  Surgeon: Lin Landsman, MD;  Location: [place];  Service: Endoscopy;  Laterality: N/A;  . DIALYSIS/PERMA CATHETER INSERTION N/A 01/18/2017   Procedure: Dialysis/Perma Catheter Insertion;  Surgeon: Algernon Huxley,  MD;  Location: [place] [facility];  Service: Cardiovascular;  Laterality: N/A;  . DIALYSIS/PERMA CATHETER REMOVAL N/A 07/26/2017   Procedure: DIALYSIS/PERMA CATHETER REMOVAL;  Surgeon: Algernon Huxley, MD;  Location: [place] [facility];  Service: Cardiovascular;  Laterality: N/A;  . DIALYSIS/PERMA CATHETER REMOVAL N/A 10/24/2019   Procedure: DIALYSIS/PERMA  CATHETER REMOVAL;  Surgeon: Katha Cabal, MD;  Location: [place] [facility];  Service: Cardiovascular;  Laterality: N/A;  . INCISION AND DRAINAGE OF WOUND Right ~ 10/2014   "4th toe foot"  . IRRIGATION AND DEBRIDEMENT FOOT Left 07/19/2019   Procedure: IRRIGATION AND DEBRIDEMENT FOOT;  Surgeon: Sharlotte Alamo, DPM;  Location: [facility];  Service: Podiatry;  Laterality: Left;  . LOWER EXTREMITY ANGIOGRAPHY Left 05/23/2019   Procedure: Lower Extremity Angiography;  Surgeon: Katha Cabal, MD;  Location: [place] [facility];  Service: Cardiovascular;  Laterality: Left;  . LOWER EXTREMITY ANGIOGRAPHY Left 07/18/2019   Procedure: LOWER EXTREMITY ANGIOGRAPHY;  Surgeon: Katha Cabal, MD;  Location: [place] [facility];  Service: Cardiovascular;  Laterality: Left;  . LOWER EXTREMITY ANGIOGRAPHY Right 08/01/2019   Procedure: LOWER EXTREMITY ANGIOGRAPHY;  Surgeon: Katha Cabal, MD;  Location: [place] [facility];  Service: Cardiovascular;  Laterality: Right;  . LOWER EXTREMITY ANGIOGRAPHY Right 08/04/2019   Procedure: Lower Extremity Angiography (Pedal Approach);  Surgeon: Katha Cabal, MD;  Location: [place] [facility];  Service: Cardiovascular;  Laterality: Right;  . PERCUTANEOUS CORONARY STENT INTERVENTION (PCI-S) N/A 12/14/2014   Procedure: PERCUTANEOUS CORONARY STENT INTERVENTION (PCI-S);  Surgeon: Charolette Forward, MD;  Location: [facility];  Service: Cardiovascular;  Laterality: N/A;  . PERCUTANEOUS CORONARY STENT INTERVENTION (PCI-S) N/A 12/18/2014   Procedure: PERCUTANEOUS CORONARY STENT INTERVENTION (PCI-S);  Surgeon: Charolette Forward, MD;  Location: [facility] CATH LAB;  Service: Cardiovascular;  Laterality: N/A;  . POLYPECTOMY  12/01/2017   Procedure: POLYPECTOMY;  Surgeon: Lin Landsman, MD;  Location: [place];  Service: Endoscopy;;  . POLYPECTOMY  12/29/2017   Procedure: POLYPECTOMY;  Surgeon: Lin Landsman, MD;  Location: [place];  Service:  Endoscopy;;  . TOE AMPUTATION Right 2015   4th toe    FHx:  Family History  Problem Relation Age of Onset  . Cancer Mother        Lung  . Cancer Father        Lung  . Diabetes Sister   . Diabetes Brother   . CAD Brother   . Hernia Son   . Cancer Brother     Social History:  reports that he quit smoking about 5 years ago. His smoking use included cigarettes. He started smoking about 35 years ago. He has a 30.00 pack-year smoking history. He has never used smokeless tobacco. He reports previous alcohol use. He reports that he does not use drugs.  Allergies: No Known Allergies  Review of Systems: General ROS: negative Respiratory ROS: no cough, shortness of breath, or wheezing Cardiovascular ROS: no chest pain or dyspnea on exertion Gastrointestinal ROS: no abdominal pain, change in bowel habits, or black or bloody stools Musculoskeletal ROS: positive for - joint pain and joint swelling Neurological ROS: positive for - numbness/tingling Dermatological ROS: positive for wounds both feet  Medications Prior to Admission  Medication Sig Dispense Refill  . acetaminophen (TYLENOL) 500 MG tablet Take 500-1,000 mg by mouth every 6 (six) hours as needed for mild pain or headache.    . albuterol (PROVENTIL HFA;VENTOLIN HFA) 108 (90 BASE) MCG/ACT inhaler Inhale 2  puffs into the lungs every 6 (six) hours as needed for wheezing or shortness of breath. 1 Inhaler 2  . aspirin EC 81 MG tablet Take 1 tablet (81 mg total) by mouth daily. 60 tablet 1  . atorvastatin (LIPITOR) 40 MG tablet Take 40 mg by mouth daily.    . furosemide (LASIX) 80 MG tablet Take 80 mg by mouth daily.    . insulin aspart protamine- aspart (NOVOLOG MIX 70/30) (70-30) 100 UNIT/ML injection Inject 0.25 mLs (25 Units total) into the skin 2 (two) times daily with a meal. (Patient taking differently: Inject 2 Units into the skin 2 (two) times daily as needed (blood sugar of 140 or higher). ) 20 mL 1    Physical  Exam: General: Alert and oriented.  No apparent distress.  Vascular: DP/PT pulses nonpalpable bil, no hair growth to digits.  Some chronic edema in both lower extremities and feet but no significant localized erythema  Neuro: Light touch sensation reduced BLE.  Derm: Dry gangrenous changes distally on the right fourth and fifth and left third toe.  Exposed bone is noted at the head of the proximal phalanx on the right third toe as well as the left fourth toe.  Full-thickness fibrotic ulcerations are noted at the fifth metatarsal head bilateral down to the level of the capsule of the fifth metatarsal phalangeal joint on the right.  Previous first ray resections have continued full-thickness ulcerations down to the level of the bone on the left.  Measurements are approximately the same as previous in clinic.  Also a continued full-thickness ulceration on the posterior aspect of the right heel approximately 2 cm x 1.5 cm with central devitalized and fibrotic tissue, some necrosis  MSK: Mulitple digital/ray amputations both feet.  Results for orders placed or performed during the hospital encounter of 01/12/20 (from the past 48 hour(s))  SARS Coronavirus 2 by RT PCR (hospital order, performed in [facility] hospital lab) Nasopharyngeal Nasopharyngeal Swab     Status: None   Collection Time: 01/12/20  9:08 PM   Specimen: Nasopharyngeal Swab  Result Value Ref Range   SARS Coronavirus 2 NEGATIVE NEGATIVE    Comment: (NOTE) SARS-CoV-2 target nucleic acids are NOT DETECTED. The SARS-CoV-2 RNA is generally detectable in upper and lower respiratory specimens during the acute phase of infection. The lowest concentration of SARS-CoV-2 viral copies this assay can detect is 250 copies / mL. A negative result does not preclude SARS-CoV-2 infection and should not be used as the sole basis for treatment or other patient management decisions.  A negative result may occur with improper specimen collection /  handling, submission of specimen other than nasopharyngeal swab, presence of viral mutation(s) within the areas targeted by this assay, and inadequate number of viral copies (<250 copies / mL). A negative result must be combined with clinical observations, patient history, and epidemiological information. Fact Sheet for Patients:   StrictlyIdeas.no Fact Sheet for Healthcare Providers: BankingDealers.co.za This test is not yet approved or cleared  by the [place] FDA and has been authorized for detection and/or diagnosis of SARS-CoV-2 by FDA under an Emergency Use Authorization (EUA).  This EUA will remain in effect (meaning this test can be used) for the duration of the COVID-19 declaration under Section 564(b)(1) of the Act, 21 U.S.C. section 360bbb-3(b)(1), unless the authorization is terminated or revoked sooner. Performed at [facility], [address], [place], [place] [address]   CBC with Differential/Platelet     Status: Abnormal  Collection Time: 01/12/20  9:19 PM  Result Value Ref Range   WBC 10.4 4.0 - 10.5 K/uL   RBC 3.11 (L) 4.22 - 5.81 MIL/uL   Hemoglobin 9.3 (L) 13.0 - 17.0 g/dL   HCT 29.9 (L) 39.0 - 52.0 %   MCV 96.1 80.0 - 100.0 fL   MCH 29.9 26.0 - 34.0 pg   MCHC 31.1 30.0 - 36.0 g/dL   RDW 17.2 (H) 11.5 - 15.5 %   Platelets 288 150 - 400 K/uL   nRBC 0.0 0.0 - 0.2 %   Neutrophils Relative % 87 %   Neutro Abs 9.2 (H) 1.7 - 7.7 K/uL   Lymphocytes Relative 4 %   Lymphs Abs 0.4 (L) 0.7 - 4.0 K/uL   Monocytes Relative 6 %   Monocytes Absolute 0.6 0.1 - 1.0 K/uL   Eosinophils Relative 1 %   Eosinophils Absolute 0.1 0.0 - 0.5 K/uL   Basophils Relative 1 %   Basophils Absolute 0.1 0.0 - 0.1 K/uL   Immature Granulocytes 1 %   Abs Immature Granulocytes 0.09 (H) 0.00 - 0.07 K/uL    Comment: Performed at [facility], [address] [place], [place] [address]  Comprehensive metabolic panel      Status: Abnormal   Collection Time: 01/12/20  9:19 PM  Result Value Ref Range   Sodium 136 135 - 145 mmol/L   Potassium 4.1 3.5 - 5.1 mmol/L   Chloride 102 98 - 111 mmol/L   CO2 22 22 - 32 mmol/L   Glucose, Bld 146 (H) 70 - 99 mg/dL    Comment: Glucose reference range applies only to samples taken after fasting for at least 8 hours.   BUN 52 (H) 8 - 23 mg/dL   Creatinine, Ser 6.60 (H) 0.61 - 1.24 mg/dL   Calcium 7.4 (L) 8.9 - 10.3 mg/dL   Total Protein 6.2 (L) 6.5 - 8.1 g/dL   Albumin 2.4 (L) 3.5 - 5.0 g/dL   AST 18 15 - 41 U/L   ALT 13 0 - 44 U/L   Alkaline Phosphatase 122 38 - 126 U/L   Total Bilirubin 0.8 0.3 - 1.2 mg/dL   GFR calc non Af Amer 8 (L) >60 mL/min   GFR calc Af Amer 10 (L) >60 mL/min   Anion gap 12 5 - 15    Comment: Performed at [facility], [place], [place] [address]  Troponin I (High Sensitivity)     Status: Abnormal   Collection Time: 01/12/20  9:19 PM  Result Value Ref Range   Troponin I (High Sensitivity) 60 (H) <18 ng/L    Comment: (NOTE) Elevated high sensitivity troponin I (hsTnI) values and significant  changes across serial measurements may suggest ACS but many other  chronic and acute conditions are known to elevate hsTnI results.  Refer to the "Links" section for chest pain algorithms and additional  guidance. Performed at [facility], [address] [place], [place] [address]   Brain natriuretic peptide     Status: Abnormal   Collection Time: 01/12/20  9:19 PM  Result Value Ref Range   B Natriuretic Peptide 3,262.0 (H) 0.0 - 100.0 pg/mL    Comment: Performed at [facility], [address] [place], [place] [address]  Blood gas, venous     Status: Abnormal   Collection Time: 01/12/20  9:27 PM  Result Value Ref Range   FIO2 0.36    Delivery systems NASAL CANNULA  Comment: 4L   pH, Ven 7.31 7.250 - 7.430   pCO2, Ven 43 (L) 44.0 - 60.0 mmHg   pO2, Ven 32.0 32.0 - 45.0 mmHg   Bicarbonate  21.7 20.0 - 28.0 mmol/L   Acid-base deficit 4.5 (H) 0.0 - 2.0 mmol/L   O2 Saturation 54.6 %   Patient temperature 37.0    Collection site VENOUS    Sample type VENOUS     Comment: Performed at [facility], [place], [place] [address]  Troponin I (High Sensitivity)     Status: Abnormal   Collection Time: 01/13/20 12:09 AM  Result Value Ref Range   Troponin I (High Sensitivity) 63 (H) <18 ng/L    Comment: (NOTE) Elevated high sensitivity troponin I (hsTnI) values and significant  changes across serial measurements may suggest ACS but many other  chronic and acute conditions are known to elevate hsTnI results.  Refer to the "Links" section for chest pain algorithms and additional  guidance. Performed at [facility], [address] [place], [place] [address]   Creatinine, serum     Status: Abnormal   Collection Time: 01/13/20 12:09 AM  Result Value Ref Range   Creatinine, Ser 6.68 (H) 0.61 - 1.24 mg/dL   GFR calc non Af Amer 8 (L) >60 mL/min   GFR calc Af Amer 9 (L) >60 mL/min    Comment: Performed at [facility], [address] [place], [place] [address]  CBC     Status: Abnormal   Collection Time: 01/13/20 12:28 AM  Result Value Ref Range   WBC 8.5 4.0 - 10.5 K/uL   RBC 2.84 (L) 4.22 - 5.81 MIL/uL   Hemoglobin 8.6 (L) 13.0 - 17.0 g/dL   HCT 27.6 (L) 39.0 - 52.0 %   MCV 97.2 80.0 - 100.0 fL   MCH 30.3 26.0 - 34.0 pg   MCHC 31.2 30.0 - 36.0 g/dL   RDW 17.0 (H) 11.5 - 15.5 %   Platelets 268 150 - 400 K/uL   nRBC 0.0 0.0 - 0.2 %    Comment: Performed at [facility] [address] [place], [place] [address]  Basic metabolic panel     Status: Abnormal   Collection Time: 01/13/20 12:28 AM  Result Value Ref Range   Sodium 136 135 - 145 mmol/L   Potassium 3.7 3.5 - 5.1 mmol/L   Chloride 102 98 - 111 mmol/L   CO2 22 22 - 32 mmol/L   Glucose, Bld 124 (H) 70 - 99 mg/dL    Comment: Glucose reference range applies only to  samples taken after fasting for at least 8 hours.   BUN 52 (H) 8 - 23 mg/dL   Creatinine, Ser 6.63 (H) 0.61 - 1.24 mg/dL   Calcium 7.2 (L) 8.9 - 10.3 mg/dL   GFR calc non Af Amer 8 (L) >60 mL/min   GFR calc Af Amer 10 (L) >60 mL/min   Anion gap 12 5 - 15    Comment: Performed at [facility], [place], [place] [address]  Troponin I (High Sensitivity)     Status: Abnormal   Collection Time: 01/13/20  3:21 PM  Result Value Ref Range   Troponin I (High Sensitivity) 49 (H) <18 ng/L    Comment: (NOTE) Elevated high sensitivity troponin I (hsTnI) values and significant  changes across serial measurements may suggest ACS but many other  chronic and acute conditions are known to elevate hsTnI results.  Refer to the "Links" section for chest pain algorithms and additional  guidance. Performed at [facility], [address] [place],  [address]    DG Chest [place] 1 View  Result Date: 01/12/2020 CLINICAL DATA:  Shortness of breath EXAM: PORTABLE CHEST 1 VIEW COMPARISON:  September 17, 2018 FINDINGS: Again noted is marked cardiomegaly. There is diffusely increased interstitial markings seen throughout both lungs. A moderate right pleural effusion is noted. No acute osseous abnormality. IMPRESSION: Moderate cardiomegaly and diffuse interstitial edema Moderate right pleural effusion Electronically Signed   By: Prudencio Pair M.D.   On: 01/12/2020 21:19    Blood pressure (!) 82/61, pulse 74, temperature 97.8 F (36.6 C), temperature source Oral, resp. rate 17, height 5\' 9"  (1.753 m), weight 111.3 kg, SpO2 100 %.  Assessment 1. Dry gangrene both feet 2. DM2 polyneuropathy 3. Severe PVD  Plan -Patient seen and examined. -Necrosis of feet in multiple areas. -Recommend Betadine wet-to-dry dressings every other day.  Continue local wound care.  Recommend usage of Prevalon boots as well. -Appreciate vascular recommendations and agree that there is no further  intervention that can be performed for reperfusion. -No urgent amputation needs to be done at this time but would be the next option if anything does become infected.  WBC 8.5, afebrile -We will continue to monitor patient until discharge with the appropriate follow-up back with [name].  Caroline More, DPM 01/13/2020, 5:29 PM

## 2020-01-13 NOTE — Progress Notes (Signed)
This RN noticed red MEWS while patient was under the care of the dialysis RN off the unit. Writer will assess (or pass along to night shift RN) when patient returns to unit.    01/13/20 1730  Assess: MEWS Score  BP (!) 60/44  Pulse Rate 73  Resp (!) 24  SpO2 100 %  O2 Device Nasal Cannula  O2 Flow Rate (L/min) 4 L/min  Assess: MEWS Score  MEWS Temp 0  MEWS Systolic 3  MEWS Pulse 0  MEWS RR 1  MEWS LOC 0  MEWS Score 4  MEWS Score Color Red

## 2020-01-14 LAB — URINALYSIS, ROUTINE W REFLEX MICROSCOPIC
Bilirubin Urine: NEGATIVE
Glucose, UA: NEGATIVE mg/dL
Ketones, ur: 5 mg/dL — AB
Nitrite: NEGATIVE
Protein, ur: 30 mg/dL — AB
Specific Gravity, Urine: 1.015 (ref 1.005–1.030)
pH: 5 (ref 5.0–8.0)

## 2020-01-14 LAB — BASIC METABOLIC PANEL
Anion gap: 12 (ref 5–15)
BUN: 49 mg/dL — ABNORMAL HIGH (ref 8–23)
CO2: 25 mmol/L (ref 22–32)
Calcium: 7.2 mg/dL — ABNORMAL LOW (ref 8.9–10.3)
Chloride: 103 mmol/L (ref 98–111)
Creatinine, Ser: 6.06 mg/dL — ABNORMAL HIGH (ref 0.61–1.24)
GFR calc Af Amer: 11 mL/min — ABNORMAL LOW (ref >60)
GFR calc non Af Amer: 9 mL/min — ABNORMAL LOW (ref >60)
Glucose, Bld: 88 mg/dL (ref 70–99)
Potassium: 3.7 mmol/L (ref 3.5–5.1)
Sodium: 140 mmol/L (ref 135–145)

## 2020-01-14 LAB — CBC
HCT: 26.9 % — ABNORMAL LOW (ref 39.0–52.0)
Hemoglobin: 8.2 g/dL — ABNORMAL LOW (ref 13.0–17.0)
MCH: 30 pg (ref 26.0–34.0)
MCHC: 30.5 g/dL (ref 30.0–36.0)
MCV: 98.5 fL (ref 80.0–100.0)
Platelets: 237 10*3/uL (ref 150–400)
RBC: 2.73 MIL/uL — ABNORMAL LOW (ref 4.22–5.81)
RDW: 16.9 % — ABNORMAL HIGH (ref 11.5–15.5)
WBC: 7 10*3/uL (ref 4.0–10.5)
nRBC: 0 % (ref 0.0–0.2)

## 2020-01-14 LAB — HEPATITIS B CORE ANTIBODY, TOTAL: Hep B Core Total Ab: NONREACTIVE

## 2020-01-14 LAB — BODY FLUID CELL COUNT WITH DIFFERENTIAL
Eos, Fluid: 0 %
Lymphs, Fluid: 9 %
Monocyte-Macrophage-Serous Fluid: 46 %
Neutrophil Count, Fluid: 45 %
Other Cells, Fluid: 0 %
Total Nucleated Cell Count, Fluid: 31 cu mm

## 2020-01-14 LAB — HEPATITIS C ANTIBODY: HCV Ab: NONREACTIVE

## 2020-01-14 LAB — HEPATITIS B SURFACE ANTIGEN: Hepatitis B Surface Ag: NONREACTIVE

## 2020-01-14 LAB — HEPATITIS B SURFACE ANTIBODY,QUALITATIVE: Hep B S Ab: REACTIVE — AB

## 2020-01-14 MED ORDER — OXYCODONE HCL 5 MG PO TABS
5.0000 mg | ORAL_TABLET | Freq: Once | ORAL | Status: AC
  Administered 2020-01-14: 5 mg via ORAL
  Filled 2020-01-14: qty 1

## 2020-01-14 MED ORDER — CALCIUM ACETATE (PHOS BINDER) 667 MG PO CAPS
2001.0000 mg | ORAL_CAPSULE | Freq: Three times a day (TID) | ORAL | Status: DC
  Administered 2020-01-16 – 2020-01-21 (×9): 2001 mg via ORAL
  Filled 2020-01-14 (×21): qty 3

## 2020-01-14 MED ORDER — GENTAMICIN SULFATE 0.1 % EX CREA
1.0000 "application " | TOPICAL_CREAM | Freq: Every day | CUTANEOUS | Status: DC
  Filled 2020-01-14: qty 15

## 2020-01-14 MED ORDER — DELFLEX-LC/2.5% DEXTROSE 394 MOSM/L IP SOLN
Freq: Once | INTRAPERITONEAL | Status: AC
  Filled 2020-01-14: qty 3000

## 2020-01-14 MED ORDER — DELFLEX-LC/2.5% DEXTROSE 394 MOSM/L IP SOLN
INTRAPERITONEAL | Status: DC
  Filled 2020-01-14: qty 3000

## 2020-01-14 NOTE — Progress Notes (Signed)
   Subjective/Chief Complaint: Tolerated HD yesterday   Objective: Vital signs in last 24 hours: Temp:  [97 F (36.1 C)-97.8 F (36.6 C)] 97.8 F (36.6 C) (05/16 0748) Pulse Rate:  [62-82] 62 (05/16 0748) Resp:  [11-24] 20 (05/16 0748) BP: (51-101)/(24-69) 86/46 (05/16 0748) SpO2:  [86 %-100 %] 90 % (05/16 0748) Weight:  [110.8 kg] 110.8 kg (05/16 0500)    Intake/Output from previous day: 05/15 0701 - 05/16 0700 In: 243 [P.O.:240; I.V.:3] Out: 1650 [Urine:150] Intake/Output this shift: No intake/output data recorded.  General appearance: alert and no distress Cardio: regular rate and rhythm Extremities: edema bilateral lower extremities  Lab Results:  Recent Labs    01/13/20 0028 01/14/20 0559  WBC 8.5 7.0  HGB 8.6* 8.2*  HCT 27.6* 26.9*  PLT 268 237   BMET Recent Labs    01/13/20 0028 01/14/20 0559  NA 136 140  K 3.7 3.7  CL 102 103  CO2 22 25  GLUCOSE 124* 88  BUN 52* 49*  CREATININE 6.63* 6.06*  CALCIUM 7.2* 7.2*   PT/INR No results for input(s): LABPROT, INR in the last 72 hours. ABG Recent Labs    01/12/20 2127  HCO3 21.7    Studies/Results: DG Chest Port 1 View  Result Date: 01/12/2020 CLINICAL DATA:  Shortness of breath EXAM: PORTABLE CHEST 1 VIEW COMPARISON:  September 17, 2018 FINDINGS: Again noted is marked cardiomegaly. There is diffusely increased interstitial markings seen throughout both lungs. A moderate right pleural effusion is noted. No acute osseous abnormality. IMPRESSION: Moderate cardiomegaly and diffuse interstitial edema Moderate right pleural effusion Electronically Signed   By: Prudencio Pair M.D.   On: 01/12/2020 21:19    Anti-infectives: Anti-infectives (From admission, onward)   None      Assessment/Plan: s/p Procedure(s): DIALYSIS/PERMA CATHETER INSERTION (Right) Plan for PermaCath placement tomorrow  NPO p MN  LOS: 2 days    Jamesetta So A 01/14/2020

## 2020-01-14 NOTE — Progress Notes (Signed)
1        Scott [name] NAME: Scott [name]    MR#:  [id number]  DATE OF BIRTH:  1960-10-16  SUBJECTIVE:  CHIEF COMPLAINT:   Chief Complaint  Patient presents with  . Shortness of Breath  Hypotensive, swollen -tired, waiting for dialysis REVIEW OF SYSTEMS:  Review of Systems  Constitutional: Positive for malaise/fatigue. Negative for diaphoresis, fever and weight loss.  HENT: Negative for ear discharge, ear pain, hearing loss, nosebleeds, sore throat and tinnitus.   Eyes: Negative for blurred vision and pain.  Respiratory: Negative for cough, hemoptysis, shortness of breath and wheezing.   Cardiovascular: Positive for leg swelling. Negative for chest pain, palpitations and orthopnea.  Gastrointestinal: Negative for abdominal pain, blood in stool, constipation, diarrhea, heartburn, nausea and vomiting.  Genitourinary: Negative for dysuria, frequency and urgency.  Musculoskeletal: Negative for back pain and myalgias.  Skin: Negative for itching and rash.  Neurological: Negative for dizziness, tingling, tremors, focal weakness, seizures, weakness and headaches.  Psychiatric/Behavioral: Negative for depression. The patient is not nervous/anxious.    DRUG ALLERGIES:  No Known Allergies VITALS:  Blood pressure (!) 74/49, pulse 77, temperature 97.8 F (36.6 C), temperature source Oral, resp. rate 19, height 5\' 9"  (1.753 m), weight 110.8 kg, SpO2 100 %. PHYSICAL EXAMINATION:  Physical Exam HENT:     Head: Normocephalic and atraumatic.  Eyes:     Conjunctiva/sclera: Conjunctivae normal.     Pupils: Pupils are equal, round, and reactive to light.  Neck:     Thyroid: No thyromegaly.     Trachea: No tracheal deviation.  Cardiovascular:     Rate and Rhythm: Normal rate and regular rhythm.     Heart sounds: Normal heart sounds.  Pulmonary:     Effort: Pulmonary effort is normal. No respiratory distress.     Breath sounds: Examination of the right-lower  field reveals decreased breath sounds. Examination of the left-lower field reveals decreased breath sounds. Decreased breath sounds present. No wheezing.  Chest:     Chest wall: No tenderness.  Abdominal:     General: Bowel sounds are normal. There is distension.     Palpations: Abdomen is soft.     Tenderness: There is no abdominal tenderness.     Comments: PD catheter in place.  Musculoskeletal:        General: Normal range of motion.     Cervical back: Normal range of motion and neck supple.  Skin:    General: Skin is warm and dry.     Findings: No rash.  Neurological:     Mental Status: He is alert and oriented to person, place, and time.     Cranial Nerves: No cranial nerve deficit.    LABORATORY PANEL:  Male CBC Recent Labs  Lab 01/14/20 0559  WBC 7.0  HGB 8.2*  HCT 26.9*  PLT 237   ------------------------------------------------------------------------------------------------------------------ Chemistries  Recent Labs  Lab 01/12/20 2119 01/13/20 0009 01/14/20 0559  NA 136   < > 140  K 4.1   < > 3.7  CL 102   < > 103  CO2 22   < > 25  GLUCOSE 146*   < > 88  BUN 52*   < > 49*  CREATININE 6.60*   < > 6.06*  CALCIUM 7.4*   < > 7.2*  AST 18  --   --   ALT 13  --   --   ALKPHOS 122  --   --  BILITOT 0.8  --   --    < > = values in this interval not displayed.   RADIOLOGY:  No results found. ASSESSMENT AND PLAN:  Scott [name] is a 59 y.o. male with medical history significant for ESRD on peritoneal dialysis, chronic systolic CHF (EF 48-25% by TTE 09/18/2018), CAD, insulin-dependent type 2 diabetes, anemia of chronic disease, hypertension, hyperlipidemia, peripheral vascular disease who is admitted with acute respiratory failure with hypoxia due to acute on chronic systolic CHF in setting of ESRD on PD.  Acute respiratory failure with hypoxia due to acute on chronic systolic CHF in setting of ESRD on peritoneal dialysis: Requiring 3-4 L oxygen via nasal  cannula   End Stage Renal Disease:  Status post temporary dialysis catheter by vascular surgery on 5/15 and 1 session of hemo dialysis -Permacath planned for early next week/Monday -Dialysis per nephrology  CAD: Denies any chest pain. High-sensitivity troponin I mildly elevated at 60. Suspect demand from acute on chronic CHF.  -Continue aspirin and statin  Anemia of chronic kidney disease: stable without obvious bleeding.  EPO per nephrology at dialysis  Insulin-dependent type 2 diabetes: - sensitive SSI while in hospital and adjust as needed.  Hypotension with a history of hypertension. Monitor blood pressure. may need midodrine  Peripheral vascular disease: Continue aspirin and statin.  Appreciate podiatry muscular input.  No intervention planned  Hyperlipidemia: Continue atorvastatin    Status is: Inpatient  Remains inpatient appropriate because:Ongoing diagnostic testing needed not appropriate for outpatient work up   Dispo: The patient is from: Home              Anticipated d/c is to: Home              Anticipated d/c date is: 3 days              Patient currently is not medically stable to d/c.    DVT prophylaxis: Heparin Family Communication: Discussed with patient   All the records are reviewed and case discussed with Care Management/Social Worker. Management plans discussed with the patient, nursing, nephrology and they are in agreement.  CODE STATUS: Full Code  TOTAL TIME TAKING CARE OF THIS PATIENT: 35 minutes.   More than 50% of the time was spent in counseling/coordination of care: YES  POSSIBLE D/C IN 2-3 DAYS, DEPENDING ON CLINICAL CONDITION.   Scott [name] M.D on 01/14/2020 at 12:44 PM  [facility]   CC: Primary care physician; [employer]  Note: This dictation was prepared with Diplomatic Services operational officer dictation along with smaller Company secretary. Any transcriptional errors that result from this process are unintentional.

## 2020-01-14 NOTE — Progress Notes (Signed)
This note also relates to the following rows which could not be included: Pulse Rate - Cannot attach notes to unvalidated device data Resp - Cannot attach notes to unvalidated device data BP - Cannot attach notes to unvalidated device data SpO2 - Cannot attach notes to unvalidated device data  HD START   01/14/20 1030  Vital Signs  Temp 97.8 F (36.6 C)  Temp Source Oral  Pulse Rate Source Monitor  BP Location Left Arm  BP Method Automatic  Patient Position (if appropriate) Lying  Oxygen Therapy  O2 Device Nasal Cannula  O2 Flow Rate (L/min) 4 L/min  During Hemodialysis Assessment  Blood Flow Rate (mL/min) 250 mL/min  Arterial Pressure (mmHg) -80 mmHg  Venous Pressure (mmHg) 30 mmHg  Transmembrane Pressure (mmHg) 0 mmHg  Ultrafiltration Rate (mL/min) 1.2 mL/min  Dialysate Flow Rate (mL/min) 500 ml/min  Conductivity: Machine  14.3  HD Safety Checks Performed Yes  Dialysis Fluid Bolus Normal Saline  Bolus Amount (mL) 250 mL  Intra-Hemodialysis Comments Tx initiated (cvc care per policy)

## 2020-01-14 NOTE — Progress Notes (Signed)
HD started. 

## 2020-01-14 NOTE — Progress Notes (Signed)
Called to bedside with patient c/o bilateral foot pain. Only has Tylenol for pain at this time. I have contacted provider and will attempt to get stronger medication for patient. Safety checks completed and call light placed within reach.

## 2020-01-14 NOTE — Care Management Note (Signed)
Document in error on this patient. Disregard HD flowsheet initiated at 1740. S. Dalbert Batman

## 2020-01-14 NOTE — Plan of Care (Signed)
  Problem: Education: Goal: Knowledge of General Education information will improve Description: Including pain rating scale, medication(s)/side effects and non-pharmacologic comfort measures Outcome: Progressing   Problem: Clinical Measurements: Goal: Cardiovascular complication will be avoided Outcome: Progressing   Problem: Coping: Goal: Level of anxiety will decrease Outcome: Progressing   Problem: Clinical Measurements: Goal: Respiratory complications will improve Outcome: Not Progressing Note: Pt required increase in liter flow this shift due to decrease in O2 sats and SOB

## 2020-01-14 NOTE — Progress Notes (Signed)
Patient pain seems to be well controlled at this point. And sleeping through nursing cares. Patients dressing of the bilateral feet have been changed this early AM. Betadine gauze has been placed on all foot wounds and on the gangrenous areas. Then wrapped with Kerlex and taped. Patient seemed to tolerated well. Safety checks completed and call light placed within reach.

## 2020-01-14 NOTE — Progress Notes (Signed)
Pre hd 

## 2020-01-14 NOTE — Progress Notes (Signed)
PRE hd 

## 2020-01-14 NOTE — Progress Notes (Signed)
[facility]  ROUNDING NOTE   Subjective:   Hemodialysis treatment yesterday. UF of 1.5  Today, patient brought down to dialysis and started on hemodialysis treatment.  Peritoneal fluid drained and trial of manual exchange during his hemodialysis treatment.   Objective:  Vital signs in last 24 hours:  Temp:  [97 F (36.1 C)-97.8 F (36.6 C)] 97.8 F (36.6 C) (05/16 1030) Pulse Rate:  [62-82] 80 (05/16 1145) Resp:  [0-24] 19 (05/16 1145) BP: (51-113)/(24-69) 113/64 (05/16 1145) SpO2:  [86 %-100 %] 100 % (05/16 1145) Weight:  [110.8 kg] 110.8 kg (05/16 0500)  Weight change: 8.741 kg Filed Weights   01/12/20 2345 01/13/20 0654 01/14/20 0500  Weight: 109.5 kg 111.3 kg 110.8 kg    Intake/Output: I/O last 3 completed shifts: In: 287.3 [P.O.:240; I.V.:20.6; IV Piggyback:26.8] Out: 1650 [Urine:150; Other:1500]   Intake/Output this shift:  Total I/O In: 2000 [Other:2000] Out: -   Physical Exam: General: NAD, sitting up in bed  Head: Normocephalic, atraumatic. Moist oral mucosal membranes  Eyes: Anicteric, PERRL  Neck: Supple, trachea midline  Lungs:  Bilateral crackles  Heart: Regular rate and rhythm  Abdomen:  Abdominal wall edema  Extremities:  +++ peripheral edema.  Neurologic: Nonfocal, moving all four extremities  Skin: No lesions  Access: Peritoneal dialysis catheter Right arm AVF - not functioning Right femoral temp HD catheter 5/15 Dr. Lorenso Courier    Basic Metabolic Panel: Recent Labs  Lab 01/12/20 2119 01/13/20 0009 01/13/20 0028 01/14/20 0559  NA 136  --  136 140  K 4.1  --  3.7 3.7  CL 102  --  102 103  CO2 22  --  22 25  GLUCOSE 146*  --  124* 88  BUN 52*  --  52* 49*  CREATININE 6.60* 6.68* 6.63* 6.06*  CALCIUM 7.4*  --  7.2* 7.2*    Liver Function Tests: Recent Labs  Lab 01/12/20 2119  AST 18  ALT 13  ALKPHOS 122  BILITOT 0.8  PROT 6.2*  ALBUMIN 2.4*   No results for input(s): LIPASE, AMYLASE in the last 168 hours. No  results for input(s): AMMONIA in the last 168 hours.  CBC: Recent Labs  Lab 01/12/20 2119 01/13/20 0028 01/14/20 0559  WBC 10.4 8.5 7.0  NEUTROABS 9.2*  --   --   HGB 9.3* 8.6* 8.2*  HCT 29.9* 27.6* 26.9*  MCV 96.1 97.2 98.5  PLT 288 268 237    Cardiac Enzymes: No results for input(s): CKTOTAL, CKMB, CKMBINDEX, TROPONINI in the last 168 hours.  BNP: Invalid input(s): POCBNP  CBG: No results for input(s): GLUCAP in the last 168 hours.  Microbiology: Results for orders placed or performed during the hospital encounter of 01/12/20  SARS Coronavirus 2 by RT PCR (hospital order, performed in [facility] hospital lab) Nasopharyngeal Nasopharyngeal Swab     Status: None   Collection Time: 01/12/20  9:08 PM   Specimen: Nasopharyngeal Swab  Result Value Ref Range Status   SARS Coronavirus 2 NEGATIVE NEGATIVE Final    Comment: (NOTE) SARS-CoV-2 target nucleic acids are NOT DETECTED. The SARS-CoV-2 RNA is generally detectable in upper and lower respiratory specimens during the acute phase of infection. The lowest concentration of SARS-CoV-2 viral copies this assay can detect is 250 copies / mL. A negative result does not preclude SARS-CoV-2 infection and should not be used as the sole basis for treatment or other patient management decisions.  A negative result may occur with improper specimen collection / handling,  submission of specimen other than nasopharyngeal swab, presence of viral mutation(s) within the areas targeted by this assay, and inadequate number of viral copies (<250 copies / mL). A negative result must be combined with clinical observations, patient history, and epidemiological information. Fact Sheet for Patients:   StrictlyIdeas.no Fact Sheet for Healthcare Providers: BankingDealers.co.za This test is not yet approved or cleared  by the [place] FDA and has been authorized for detection and/or diagnosis of  SARS-CoV-2 by FDA under an Emergency Use Authorization (EUA).  This EUA will remain in effect (meaning this test can be used) for the duration of the COVID-19 declaration under Section 564(b)(1) of the Act, 21 U.S.C. section 360bbb-3(b)(1), unless the authorization is terminated or revoked sooner. Performed at [facility], [address] [place], [place] [address]     Coagulation Studies: No results for input(s): LABPROT, INR in the last 72 hours.  Urinalysis: Recent Labs    01/14/20 0330  COLORURINE YELLOW*  LABSPEC 1.015  PHURINE 5.0  GLUCOSEU NEGATIVE  HGBUR MODERATE*  BILIRUBINUR NEGATIVE  KETONESUR 5*  PROTEINUR 30*  NITRITE NEGATIVE  LEUKOCYTESUR MODERATE*      Imaging: DG Chest Port 1 View  Result Date: 01/12/2020 CLINICAL DATA:  Shortness of breath EXAM: PORTABLE CHEST 1 VIEW COMPARISON:  September 17, 2018 FINDINGS: Again noted is marked cardiomegaly. There is diffusely increased interstitial markings seen throughout both lungs. A moderate right pleural effusion is noted. No acute osseous abnormality. IMPRESSION: Moderate cardiomegaly and diffuse interstitial edema Moderate right pleural effusion Electronically Signed   By: Prudencio Pair M.D.   On: 01/12/2020 21:19     Medications:   . sodium chloride Stopped (01/13/20 0231)   . aspirin EC  81 mg Oral Daily  . atorvastatin  40 mg Oral Daily  . Chlorhexidine Gluconate Cloth  6 each Topical Q0600  . furosemide  80 mg Intravenous BID  . heparin  5,000 Units Subcutaneous Q8H  . pantoprazole  40 mg Oral Daily  . sodium chloride flush  3 mL Intravenous Q12H   sodium chloride, acetaminophen **OR** acetaminophen, ondansetron **OR** ondansetron (ZOFRAN) IV  Assessment/ Plan:  [name] is a 59 y.o. white male with end stage renal disease on peritoneal dialysis, hypertension, peripheral vascular disease, diabetes mellitus type II, asthma , left toe amputation who is admitted to [facility] on 01/12/2020  for ESRD (end stage renal disease) on dialysis ([place]) [N18.6, Z99.2] Acute respiratory failure with hypoxia ([place]) [J96.01] Congestive heart failure, unspecified HF chronicity, unspecified heart failure type ([place]) [I50.9]  Scott [name] 99kg Outpatient Peritoneal dialysis CAPD 4 exchanges 2 liter fills  1. End Stage Renal Disease: patient is significantly volume overloaded and seem to have inadequate dialysis. He was 12 kg above his estimated dry weight of 99kg Will need back up hemodialysis until down to his dry weight.  - Vascular for tunneled dialysis catheter.  - Manual peritoneal dialysis. If no ultrafiltration, concern for membrane failure.   2. Hypotensive currently. Holding home agents - IV furosemide to assist with urine output and volume overload.   3. Anemia with chronic kidney disease: normocytic. Hemoglobin 8.2 - ESA with HD starting on Monday.   4. Secondary Hyperparathyroidism: with hyperphosphatemia and hypocalcemia - restart calcium acetate with meals.   5. Peripheral vascular disease:  - appreciate wound care, podiatry and vascular   LOS: 2 Demaya Hardge 5/16/202111:52 AM

## 2020-01-14 NOTE — Progress Notes (Signed)
PD adapter for [employer] to [employer] placed per policy. Manual drain performed with 837ml clear/yellow effluent. Effluent sample sent to lab. Manual cycle with 2.5% solution 2076ml instilled with orders for 2 hour dwell.

## 2020-01-14 NOTE — Progress Notes (Signed)
Approx. 11pm pt rang call bell and reported SOB/difficulty breathing, upon entering room pt noted taking deep breaths verbalizing difficulty breathing, O2 sats obtained and noted at 84%, O2 increased from 2L/min to 4L/min via n/c, pt repositioned and HOB elevated, Scott Falco, NP notified, no new orders given, at time of communication with NP, NP made aware one time order for Oxycodone entered per pt request for stronger pain medication approx. 9:40pm this shift, pt reports relief with increase in O2, pt resting in bed with call bell in reach, will continue to monitor pt this shift and make NP aware of any new changes or concerns.

## 2020-01-14 NOTE — Progress Notes (Signed)
PRE-HD  01/14/20 1010  Hand-Off documentation  Report received from (Full Name) Charlynn Court RN  Vital Signs  Temp 97.8 F (36.6 C)  Temp Source Oral  Pulse Rate 80  Pulse Rate Source Monitor  Resp 18  BP (!) 82/56  BP Location Left Arm  BP Method Automatic  Patient Position (if appropriate) Lying  Oxygen Therapy  SpO2 100 %  O2 Device Nasal Cannula  O2 Flow Rate (L/min) 4 L/min  Pain Assessment  Pain Scale 0-10  Pain Score 0  Time-Out for Hemodialysis  What Procedure? HD  Pt Identifiers(min of two) First/Last Name;MRN/Account#  Correct Site? Yes  Correct Side? Yes  Correct Procedure? Yes  Consents Verified? Yes  Rad Studies Available? N/A  Safety Precautions Reviewed? Yes  Engineer, civil (consulting) Number 1  Station Number 205 (A)  UF/Alarm Test Passed  Conductivity: Meter 13.6  Conductivity: Machine  13.6  pH 7.2  Reverse Osmosis 5  Normal Saline Lot Number [id number]  Dialyzer Lot Number [id number]  Disposable Set Lot Number [id number]  Machine Temperature 98.6 F (37 C)  Musician and Audible Yes  Blood Lines Intact and Secured Yes  Pre Treatment Patient Checks  Vascular access used during treatment Catheter  HD catheter dressing before treatment No Biopatch;WDL  Hepatitis B Surface Antigen Results Negative  Date Hepatitis B Surface Antigen Drawn 01/13/20  Hepatitis B Surface Antibody  (<10)  Date Hepatitis B Surface Antibody Drawn 01/13/20  Hemodialysis Consent Verified Yes  Hemodialysis Standing Orders Initiated Yes  ECG (Telemetry) Monitor On Yes  Prime Ordered Normal Saline  Length of  DialysisTreatment -hour(s) 2.5 Hour(s)  Dialyzer Elisio 17H NR  Dialysate 3K;2.5 Ca  Variable Sodium  (138)  Dialysis Anticoagulant None  Dialysate Flow Ordered 500  Blood Flow Rate Ordered 250 mL/min  Ultrafiltration Goal 3 Liters  Dialysis Blood Pressure Support Ordered Normal Saline  Education / Care Plan  Dialysis Education Provided Yes  Documented  Education in Care Plan Yes  Hemodialysis Catheter Right Femoral vein Triple lumen Temporary (Non-Tunneled)  Placement Date/Time: 01/13/20 1438   Time Out: Correct patient;Correct site;Correct procedure  Maximum sterile barrier precautions: Hand hygiene;Cap;Mask;Sterile gown;Sterile gloves;Large sterile sheet  Site Prep: Chlorhexidine (preferred)  Local Anes...  Site Condition No complications  Blue Lumen Status Flushed;Blood return noted  Red Lumen Status Flushed;Blood return noted  Purple Lumen Status Capped (Central line)  Dressing Type Gauze/Drain sponge  Dressing Status Intact;Dry;Clean  Interventions Dressing changed  Dressing Change Due 01/21/20

## 2020-01-15 ENCOUNTER — Other Ambulatory Visit (INDEPENDENT_AMBULATORY_CARE_PROVIDER_SITE_OTHER): Payer: Self-pay | Admitting: Vascular Surgery

## 2020-01-15 ENCOUNTER — Encounter
Admission: EM | Disposition: A | Discharge status: Home / Self Care | Payer: Self-pay | Source: Home / Self Care | Attending: Internal Medicine

## 2020-01-15 DIAGNOSIS — N185 Chronic kidney disease, stage 5: Secondary | ICD-10-CM

## 2020-01-15 HISTORY — PX: DIALYSIS/PERMA CATHETER INSERTION: CATH118288

## 2020-01-15 LAB — URINE CULTURE: Culture: NO GROWTH

## 2020-01-15 SURGERY — DIALYSIS/PERMA CATHETER INSERTION
Anesthesia: Moderate Sedation | Laterality: Right

## 2020-01-15 MED ORDER — METHYLPREDNISOLONE SODIUM SUCC 125 MG IJ SOLR: 125.0000 mg | Freq: Once | INTRAMUSCULAR | Status: DC | PRN

## 2020-01-15 MED ORDER — DIPHENHYDRAMINE HCL 50 MG/ML IJ SOLN: 50.0000 mg | Freq: Once | INTRAMUSCULAR | Status: DC | PRN

## 2020-01-15 MED ORDER — EPOETIN ALFA 10000 UNIT/ML IJ SOLN
10000.0000 [IU] | INTRAMUSCULAR | Status: DC
  Filled 2020-01-15: qty 1

## 2020-01-15 MED ORDER — MIDAZOLAM HCL 2 MG/2ML IJ SOLN
INTRAMUSCULAR | Status: DC | PRN
  Administered 2020-01-15: 1 mg via INTRAVENOUS

## 2020-01-15 MED ORDER — FENTANYL CITRATE (PF) 100 MCG/2ML IJ SOLN
INTRAMUSCULAR | Status: DC | PRN
  Administered 2020-01-15: 25 ug via INTRAVENOUS

## 2020-01-15 MED ORDER — MIDAZOLAM HCL 5 MG/5ML IJ SOLN
INTRAMUSCULAR | Status: AC
  Filled 2020-01-15: qty 5

## 2020-01-15 MED ORDER — MIDAZOLAM HCL 2 MG/ML PO SYRP: 8.0000 mg | ORAL_SOLUTION | Freq: Once | ORAL | Status: DC | PRN

## 2020-01-15 MED ORDER — COLLAGENASE 250 UNIT/GM EX OINT
TOPICAL_OINTMENT | Freq: Every day | CUTANEOUS | Status: DC
  Filled 2020-01-15: qty 30

## 2020-01-15 MED ORDER — FENTANYL CITRATE (PF) 100 MCG/2ML IJ SOLN
INTRAMUSCULAR | Status: AC
  Filled 2020-01-15: qty 2

## 2020-01-15 MED ORDER — SODIUM CHLORIDE 0.9 % IV SOLN: INTRAVENOUS | Status: DC

## 2020-01-15 MED ORDER — FAMOTIDINE 20 MG PO TABS: 40.0000 mg | ORAL_TABLET | Freq: Once | ORAL | Status: DC | PRN

## 2020-01-15 MED ORDER — CEFAZOLIN SODIUM-DEXTROSE 1-4 GM/50ML-% IV SOLN
1.0000 g | Freq: Once | INTRAVENOUS | Status: AC
  Administered 2020-01-15: 1 g via INTRAVENOUS

## 2020-01-15 MED ORDER — HYDROMORPHONE HCL 1 MG/ML IJ SOLN
1.0000 mg | Freq: Once | INTRAMUSCULAR | Status: AC | PRN
  Administered 2020-01-15: 1 mg via INTRAVENOUS
  Filled 2020-01-15: qty 1

## 2020-01-15 MED ORDER — MIDODRINE HCL 5 MG PO TABS
5.0000 mg | ORAL_TABLET | Freq: Three times a day (TID) | ORAL | Status: DC
  Administered 2020-01-16 (×2): 5 mg via ORAL
  Filled 2020-01-15 (×2): qty 1

## 2020-01-15 MED ORDER — ONDANSETRON HCL 4 MG/2ML IJ SOLN
4.0000 mg | Freq: Four times a day (QID) | INTRAMUSCULAR | Status: DC | PRN
  Administered 2020-01-21: 4 mg via INTRAVENOUS

## 2020-01-15 SURGICAL SUPPLY — 8 items
BIOPATCH RED 1 DISK 7.0 (GAUZE/BANDAGES/DRESSINGS) ×1 IMPLANT
BIOPATCH RED 1IN DISK 7.0MM (GAUZE/BANDAGES/DRESSINGS) ×1
CATH PALINDROME RT-P 15FX19CM (CATHETERS) ×2 IMPLANT
DERMABOND ADVANCED (GAUZE/BANDAGES/DRESSINGS) ×2
DERMABOND ADVANCED .7 DNX12 (GAUZE/BANDAGES/DRESSINGS) IMPLANT
PACK ANGIOGRAPHY (CUSTOM PROCEDURE TRAY) ×2 IMPLANT
SUT MNCRL AB 4-0 PS2 18 (SUTURE) ×2 IMPLANT
SUT PROLENE 0 CT 1 30 (SUTURE) ×2 IMPLANT

## 2020-01-15 NOTE — Progress Notes (Signed)
[facility]  ROUNDING NOTE   Subjective:   Peritoneal dialysis treatment last night. UF of 146mL. Dextrose 2.5%.   Hemodialysis treatment yesterday. Uf of 1.5 liters.   Objective:  Vital signs in last 24 hours:  Temp:  [97.8 F (36.6 C)-98.4 F (36.9 C)] 98.2 F (36.8 C) (05/17 0034) Pulse Rate:  [33-90] 80 (05/17 0034) Resp:  [15-25] 16 (05/17 0034) BP: (62-156)/(35-85) 90/52 (05/17 0736) SpO2:  [96 %-100 %] 97 % (05/17 0034) Weight:  [110.5 kg-110.8 kg] 110.5 kg (05/17 0500)  Weight change: 0 kg Filed Weights   01/14/20 0500 01/14/20 1742 01/15/20 0500  Weight: 110.8 kg 110.8 kg 110.5 kg    Intake/Output: I/O last 3 completed shifts: In: 2240 [P.O.:240; Other:2000] Out: 5200 [Urine:200; Other:5000]   Intake/Output this shift:  No intake/output data recorded.  Physical Exam: General: NAD, laying in bed  Head: Normocephalic, atraumatic. Moist oral mucosal membranes  Eyes: Anicteric, PERRL  Neck: Supple, trachea midline  Lungs:  Bilateral crackles  Heart: Regular rate and rhythm  Abdomen:  Abdominal wall edema  Extremities:  ++ peripheral edema.  Neurologic: Nonfocal, moving all four extremities  Skin: No lesions  Access: Peritoneal dialysis catheter Right arm AVF - not functioning Right femoral temp HD catheter 5/15 Dr. Lorenso Courier    Basic Metabolic Panel: Recent Labs  Lab 01/12/20 2119 01/13/20 0009 01/13/20 0028 01/14/20 0559  NA 136  --  136 140  K 4.1  --  3.7 3.7  CL 102  --  102 103  CO2 22  --  22 25  GLUCOSE 146*  --  124* 88  BUN 52*  --  52* 49*  CREATININE 6.60* 6.68* 6.63* 6.06*  CALCIUM 7.4*  --  7.2* 7.2*    Liver Function Tests: Recent Labs  Lab 01/12/20 2119  AST 18  ALT 13  ALKPHOS 122  BILITOT 0.8  PROT 6.2*  ALBUMIN 2.4*   No results for input(s): LIPASE, AMYLASE in the last 168 hours. No results for input(s): AMMONIA in the last 168 hours.  CBC: Recent Labs  Lab 01/12/20 2119 01/13/20 0028 01/14/20 0559   WBC 10.4 8.5 7.0  NEUTROABS 9.2*  --   --   HGB 9.3* 8.6* 8.2*  HCT 29.9* 27.6* 26.9*  MCV 96.1 97.2 98.5  PLT 288 268 237    Cardiac Enzymes: No results for input(s): CKTOTAL, CKMB, CKMBINDEX, TROPONINI in the last 168 hours.  BNP: Invalid input(s): POCBNP  CBG: No results for input(s): GLUCAP in the last 168 hours.  Microbiology: Results for orders placed or performed during the hospital encounter of 01/12/20  SARS Coronavirus 2 by RT PCR (hospital order, performed in [facility] hospital lab) Nasopharyngeal Nasopharyngeal Swab     Status: None   Collection Time: 01/12/20  9:08 PM   Specimen: Nasopharyngeal Swab  Result Value Ref Range Status   SARS Coronavirus 2 NEGATIVE NEGATIVE Final    Comment: (NOTE) SARS-CoV-2 target nucleic acids are NOT DETECTED. The SARS-CoV-2 RNA is generally detectable in upper and lower respiratory specimens during the acute phase of infection. The lowest concentration of SARS-CoV-2 viral copies this assay can detect is 250 copies / mL. A negative result does not preclude SARS-CoV-2 infection and should not be used as the sole basis for treatment or other patient management decisions.  A negative result may occur with improper specimen collection / handling, submission of specimen other than nasopharyngeal swab, presence of viral mutation(s) within the areas targeted by this assay,  and inadequate number of viral copies (<250 copies / mL). A negative result must be combined with clinical observations, patient history, and epidemiological information. Fact Sheet for Patients:   StrictlyIdeas.no Fact Sheet for Healthcare Providers: BankingDealers.co.za This test is not yet approved or cleared  by the [place] FDA and has been authorized for detection and/or diagnosis of SARS-CoV-2 by FDA under an Emergency Use Authorization (EUA).  This EUA will remain in effect (meaning this test can be used)  for the duration of the COVID-19 declaration under Section 564(b)(1) of the Act, 21 U.S.C. section 360bbb-3(b)(1), unless the authorization is terminated or revoked sooner. Performed at [facility], [address] [place], [place] [address]   Body fluid culture     Status: None (Preliminary result)   Collection Time: 01/13/20 10:45 AM   Specimen: Peritoneal Washings; Body Fluid  Result Value Ref Range Status   Specimen Description   Final    PERITONEAL Performed at [facility], [address], [place], [place] [address]    Special Requests   Final    NONE Performed at [facility], [address] [place], [place] [address]    Gram Stain   Final    NO WBC SEEN NO ORGANISMS SEEN Performed at [facility], [place] [address] [place], [place] [address]    Culture PENDING  Incomplete   Report Status PENDING  Incomplete  Urine Culture     Status: None   Collection Time: 01/14/20  3:30 AM   Specimen: Urine, Random  Result Value Ref Range Status   Specimen Description   Final    URINE, RANDOM Performed at [facility], [address], [place], [place] [address]    Special Requests   Final    NONE Performed at [facility], [address] [place], [place] [address]    Culture   Final    NO GROWTH Performed at [facility], [place] [address] [place],  [address]    Report Status 01/15/2020 FINAL  Final    Coagulation Studies: No results for input(s): LABPROT, INR in the last 72 hours.  Urinalysis: Recent Labs    01/14/20 0330  COLORURINE YELLOW*  LABSPEC 1.015  PHURINE 5.0  GLUCOSEU NEGATIVE  HGBUR MODERATE*  BILIRUBINUR NEGATIVE  KETONESUR 5*  PROTEINUR 30*  NITRITE NEGATIVE  LEUKOCYTESUR MODERATE*      Imaging: No results found.   Medications:   . sodium chloride Stopped (01/13/20 0231)   . aspirin EC  81 mg Oral Daily  . atorvastatin  40 mg Oral Daily  . calcium acetate  2,001 mg  Oral TID WC  . Chlorhexidine Gluconate Cloth  6 each Topical Q0600  . collagenase   Topical Daily  . furosemide  80 mg Intravenous BID  . heparin  5,000 Units Subcutaneous Q8H  . pantoprazole  40 mg Oral Daily  . sodium chloride flush  3 mL Intravenous Q12H   sodium chloride, acetaminophen **OR** acetaminophen, ondansetron **OR** ondansetron (ZOFRAN) IV  Assessment/ Plan:  Scott [name] is a 59 y.o. white male with end stage renal disease on peritoneal dialysis, hypertension, peripheral vascular disease, diabetes mellitus type II, asthma , left toe amputation who is admitted to [facility] on 01/12/2020 for ESRD (end stage renal disease) on dialysis ([place]) [N18.6, Z99.2] Acute respiratory failure with hypoxia ([place]) [J96.01] Congestive heart failure, unspecified HF chronicity, unspecified heart failure type ([place]) [I50.9]  [place] 99kg Outpatient  Peritoneal dialysis CAPD 4 exchanges 2 liter fills  1. End Stage Renal Disease: patient is significantly volume overloaded and seem to have inadequate dialysis. He was 12 kg above his estimated dry weight of 99kg Will need back up hemodialysis until down to his dry weight.  - Vascular for tunneled dialysis catheter later today.  - Daily hemodialysis  - Will reevaluate peritoneal dialysis later this week. Manual peritoneal dialysis. If no ultrafiltration, concern for membrane failure.   2. Hypotensive: 90/52. Holding home agents - discontinue furosemide - start furosemide.  3. Anemia with chronic kidney disease: normocytic. Hemoglobin 8.2 - ESA with HD starting on Monday.   4. Secondary Hyperparathyroidism: with hyperphosphatemia and hypocalcemia - calcium acetate with meals.   5. Peripheral vascular disease:  - appreciate wound care, podiatry and vascular   LOS: 3 Kaysee Hergert 5/17/202112:30 PM

## 2020-01-15 NOTE — Consult Note (Signed)
WOC Nurse Consult Note: Nonhealing vascular wounds to bilateral feet.  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  Remaining toes are necrotic and nonintact on plantar surface.  Reason for Consult: Nonhealing vascular wounds  Unstageable pressure injury to sacrum and upper buttocks.  Patient complaining of chronic dysuria x 1 month.  UA has been obtained.  Wound type: Vascualr wounds and pressure/moisture to buttocks.  Pressure Injury POA: Yes girlfriend has been treating with calmoseptine  Measurement:sacrum:  3 cm x 2.4 cm slough to wound bed Necrotic toes with nonhealing surgical incisions from previous amputations. WIll continue betadine daily to feet.  Wound IBB:[id number] toes and some drainage from surgical incision line bilateral feet Drainage (amount, consistency, odor) minimal serosanguinous   Periwound:dry skin  Dressing procedure/placement/frequency: Cleanse wounds to bilateral feet with NS and pat dry.  Apply betadine to nonintact wounds and necrotic toes on feet daily.  Cover with dry dressing, secure with kerlix and tape.  Cleanse sacral wound with NS and pat dry. Apply Santyl to wound bed.  Cover with NS moist gauze  Secure with silicone foam. Change daily.   Will not follow at this time.  Please re-consult if needed.  Domenic Moras MSN, RN, FNP-BC CWON Wound, Ostomy, Continence Nurse Pager [phone]

## 2020-01-15 NOTE — TOC Initial Note (Signed)
Transition of Care [facility]) - Initial/Assessment Note    Patient Details  Name: Scott [name] MRN: [id number] Date of Birth: 1961/02/18  [facility]) CM/SW Contact:    Elease Hashimoto, LCSW Phone Number: 01/15/2020, 8:40 AM  Clinical Narrative:   Met with pt to discuss discharge needs. Pt lives with his girlfriend [place] who he has been with for 30 years. She helps him with his care, he uses a rw and she does the rest. He has been doing PD at home and is going for a procedure today. He is currently on O2 and this is new for him. He does have a PCP and [place] drives him to appointments. Will follow along in case any needs.                Expected Discharge Plan: Home/Self Care Barriers to Discharge: Continued Medical Work up   Patient Goals and CMS Choice Patient states their goals for this hospitalization and ongoing recovery are:: I hope to feel better and then go home      Expected Discharge Plan and Services Expected Discharge Plan: Home/Self Care In-house Referral: Clinical Social Work   Post Acute Care Choice: Dialysis Living arrangements for the past 2 months: Single Family Home                                      Prior Living Arrangements/Services Living arrangements for the past 2 months: Single Family Home Lives with:: Significant Other Patient language and need for interpreter reviewed:: No Do you feel safe going back to the place where you live?: Yes      Need for Family Participation in Patient Care: Yes (Comment) Care giver support system in place?: Yes (comment) Current home services: DME(has rw) Criminal Activity/Legal Involvement Pertinent to Current Situation/Hospitalization: No - Comment as needed  Activities of Daily Living Home Assistive Devices/Equipment: Blood pressure cuff ADL Screening (condition at time of admission) Patient's cognitive ability adequate to safely complete daily activities?: Yes Is the patient deaf or have  difficulty hearing?: No Does the patient have difficulty seeing, even when wearing glasses/contacts?: No Does the patient have difficulty concentrating, remembering, or making decisions?: No Patient able to express need for assistance with ADLs?: Yes Does the patient have difficulty dressing or bathing?: Yes Independently performs ADLs?: No Communication: Independent, Needs assistance Is this a change from baseline?: Pre-admission baseline Grooming: Independent Feeding: Independent Bathing: Needs assistance Toileting: Needs assistance Is this a change from baseline?: Pre-admission baseline In/Out Bed: Needs assistance Is this a change from baseline?: Pre-admission baseline Walks in Home: Dependent(does not ambulate) Is this a change from baseline?: Pre-admission baseline Does the patient have difficulty walking or climbing stairs?: Yes Weakness of Legs: Both Weakness of Arms/Hands: Both  Permission Sought/Granted Permission sought to share information with : Family Supports Permission granted to share information with : Yes, Verbal Permission Granted  Share Information with NAME: Kathleen Argue     Permission granted to share info w Relationship: Girlfriend     Emotional Assessment Appearance:: Appears older than stated age Attitude/Demeanor/Rapport: Gracious Affect (typically observed): Adaptable Orientation: : Oriented to Self, Oriented to Place, Oriented to  Time, Oriented to Situation   Psych Involvement: No (comment)  Admission diagnosis:  ESRD (end stage renal disease) on dialysis ([place]) [N18.6, Z99.2] Acute respiratory failure with hypoxia (H[facility]) [J96.01] Congestive heart failure, unspecified HF chronicity, unspecified heart failure type ([place]) [  I50.9] Patient Active Problem List   Diagnosis Date Noted  . Pressure injury of skin 01/13/2020  . Acute respiratory failure with hypoxia ([place]) 01/12/2020  . ESRD on peritoneal dialysis ([place]) 01/12/2020  . Hyperlipidemia associated  with type 2 diabetes mellitus ([place]) 01/12/2020  . Generalized (acute) peritonitis ([place]) 10/09/2019  . Generalized abdominal pain 10/02/2019  . Leg pain, right 07/31/2019  . PAD (peripheral artery disease) ([place]) 07/17/2019  . Osteomyelitis of second toe of left foot ([place]) 07/17/2019  . Type II diabetes mellitus ([place])   . PVD (peripheral vascular disease) ([place])   . Non-Q wave myocardial infarction ([place])   . Hypertension associated with diabetes ([place])   . Coronary artery disease   . Ulcerated, foot, right, with necrosis of muscle ([place])   . Atherosclerosis of artery of extremity with ulceration ([place]) 07/03/2019  . Anemia 04/15/2019  . Cellulitis 01/21/2019  . History of colonic polyps   . Positive fecal occult blood test   . Complete traumatic amputation of one right lesser toe, initial encounter ([place]) 06/02/2017  . Atherosclerotic heart disease of native coronary artery without angina pectoris 06/02/2017  . Encounter for screening for depression 06/02/2017  . Gastro-esophageal reflux disease with esophagitis 06/02/2017  . Other asthma 06/02/2017  . Other specified arthritis, unspecified site 06/02/2017  . Acidosis 05/31/2017  . Anemia in ESRD (end-stage renal disease) ([place]) 05/31/2017  . Hyperkalemia 05/31/2017  . Iron deficiency anemia, unspecified 05/31/2017  . Liver disease, unspecified 05/31/2017  . Moderate protein-calorie malnutrition ([place]) 05/31/2017  . Other dietary vitamin B12 deficiency anemia 05/31/2017  . Other disorders of electrolyte and fluid balance, not elsewhere classified 05/31/2017  . Other disorders resulting from impaired renal tubular function 05/31/2017  . Other hyperlipidemia 05/31/2017  . Other long term (current) drug therapy 05/31/2017  . Secondary hyperparathyroidism of renal origin ([place]) 05/31/2017  . Unspecified jaundice 05/31/2017  . Acute on chronic systolic CHF (congestive heart failure) ([place]) 01/17/2017  . ESRD (end stage renal disease) ([place])  11/25/2016  . Tobacco abuse 11/25/2016  . Non-ST elevation (NSTEMI) myocardial infarction ([place]) 10/21/2016  . Pure hypercholesterolemia 10/21/2016  . Type 2 diabetes mellitus with other diabetic kidney complication ([place]) 89/38/1017  . GERD (gastroesophageal reflux disease) 04/04/2015   PCP:  Inc, [place]:   [address], [place] [address] Phone: [phone] Fax: [phone]     Social Determinants of Health (SDOH) Interventions    Readmission Risk Interventions Readmission Risk Prevention Plan 08/04/2019 07/19/2019 07/19/2019  Transportation Screening Complete Complete Complete  Medication Review Press photographer) Referral to Pharmacy Complete Complete  [place] or Home Care Consult Complete Complete Complete  SW Recovery Care/Counseling Consult Complete - -  Palliative Care Screening Not Applicable Not Applicable -  [place] Not Applicable Not Applicable -  Some recent data might be hidden

## 2020-01-15 NOTE — H&P (Signed)
[facility] History & Physical Update  The patient was interviewed and re-examined.  The patient's previous History and Physical has been reviewed and is unchanged.  There is no change in the plan of care. We plan to proceed with the scheduled procedure.  Leotis Pain, MD  01/15/2020, 3:42 PM

## 2020-01-15 NOTE — Op Note (Signed)
OPERATIVE NOTE    PRE-OPERATIVE DIAGNOSIS: 1. ESRD   POST-OPERATIVE DIAGNOSIS: same as above  PROCEDURE: 1. Ultrasound guidance for vascular access to the right internal jugular vein 2. Fluoroscopic guidance for placement of catheter 3. Placement of a 19 cm tip to cuff tunneled hemodialysis catheter via the right internal jugular vein  SURGEON: Leotis Pain, MD  ANESTHESIA:  Local with Moderate conscious sedation for approximately 15 minutes using 1 mg of Versed and 25 mcg of Fentanyl  ESTIMATED BLOOD LOSS: 10 cc  FLUORO TIME: less than one minute  CONTRAST: none  FINDING(S): 1.  Patent right internal jugular vein  SPECIMEN(S):  None  INDICATIONS:   Scott [name] is a 59 y.o.male who presents with renal failure.  The patient needs long term dialysis access for their ESRD, and a Permcath is necessary.  Risks and benefits are discussed and informed consent is obtained.    DESCRIPTION: After obtaining full informed written consent, the patient was brought back to the vascular suited. The patient's right neck and chest were sterilely prepped and draped in a sterile surgical field was created. Moderate conscious sedation was administered during a face to face encounter with the patient throughout the procedure with my supervision of the RN administering medicines and monitoring the patient's vital signs, pulse oximetry, telemetry and mental status throughout from the start of the procedure until the patient was taken to the recovery room.  The right internal jugular vein was visualized with ultrasound and found to be patent. It was then accessed under direct ultrasound guidance and a permanent image was recorded. A wire was placed. After skin nick and dilatation, the peel-away sheath was placed over the wire. I then turned my attention to an area under the clavicle. Approximately 1-2 fingerbreadths below the clavicle a small counterincision was created and tunneled from the subclavicular  incision to the access site. Using fluoroscopic guidance, a 19 centimeter tip to cuff tunneled hemodialysis catheter was selected, and tunneled from the subclavicular incision to the access site. It was then placed through the peel-away sheath and the peel-away sheath was removed. Using fluoroscopic guidance the catheter tips were parked in the right atrium. The appropriate distal connectors were placed. It withdrew blood well and flushed easily with heparinized saline and a concentrated heparin solution was then placed. It was secured to the chest wall with 2 Prolene sutures. The access incision was closed single 4-0 Monocryl. A 4-0 Monocryl pursestring suture was placed around the exit site. Sterile dressings were placed. The patient tolerated the procedure well and was taken to the recovery room in stable condition.  COMPLICATIONS: None  CONDITION: Stable  Leotis Pain, MD 01/15/2020 4:36 PM   This note was created with Dragon Medical transcription system. Any errors in dictation are purely unintentional.

## 2020-01-15 NOTE — Progress Notes (Signed)
PODIATRY / FOOT AND ANKLE SURGERY PROGRESS NOTE  Requesting Physician: Max Sane MD  Reason for consult: Bilateral foot gangrene  Chief Complaint: Gangrene both feet   HPI: Scott [name] is a 59 y.o. male who presents resting in bed.  Patient denies any complaints at this time.  Patient states that his shortness of breath has improved since his admission overall he is doing much better than when he came in.  PMHx:  Past Medical History:  Diagnosis Date  . Acute respiratory failure with hypoxia ([place]) 09/17/2018  . Arthritis    "left arm; right leg" (12/13/2014)  . Asthma   . CHF (congestive heart failure) ([place])   . Chronic disease anemia    [name] 12/13/2014  . Chronic kidney disease (CKD), stage IV (severe) ([place])    [name] 12/13/2014... on dialysis  . Complication of anesthesia    unable to urinate after CAPD urgery  . Continuous ambulatory peritoneal dialysis status ([place])   . Coronary artery disease    [name] 12/13/2014  . Depression   . Dysrhythmia    patient unaware of irregular heartbeat  . GERD (gastroesophageal reflux disease)   . High cholesterol    [name] 12/13/2014  . Hypertension   . Non-Q wave myocardial infarction ([place])    [name] 12/13/2014  . PVD (peripheral vascular disease) ([place])    [name] 12/13/2014  . Type II diabetes mellitus ([place])     Surgical Hx:  Past Surgical History:  Procedure Laterality Date  . AMPUTATION Right 08/08/2019   Procedure: AMPUTATION RAY GREAT TOE;  Surgeon: Sharlotte Alamo, DPM;  Location: [facility];  Service: Podiatry;  Laterality: Right;  . AMPUTATION TOE Left 05/24/2019   Procedure: Left great toe amputation;  Surgeon: Caroline More, DPM;  Location: [facility];  Service: Podiatry;  Laterality: Left;  . AMPUTATION TOE Left 07/19/2019   Procedure: AMPUTATION 2nd TOE PARTIAL RAY RESECTION;  Surgeon: Sharlotte Alamo, DPM;  Location: [facility];  Service: Podiatry;  Laterality: Left;  . AV FISTULA PLACEMENT Right 04/07/2017   Procedure: ARTERIOVENOUS  (AV) FISTULA CREATION ( BRACHIALCEPHALIC );  Surgeon: Katha Cabal, MD;  Location: [facility];  Service: Vascular;  Laterality: Right;  . CAPD INSERTION N/A 04/07/2017   Procedure: LAPAROSCOPIC INSERTION CONTINUOUS AMBULATORY PERITONEAL DIALYSIS  (CAPD) CATHETER;  Surgeon: Katha Cabal, MD;  Location: [facility];  Service: Vascular;  Laterality: N/A;  . CARDIAC CATHETERIZATION  11/2014  . CENTRAL LINE INSERTION-TUNNELED N/A 08/11/2019   Procedure: CENTRAL LINE INSERTION-TUNNELED ([place]);  Surgeon: Katha Cabal, MD;  Location: [place] [facility];  Service: Cardiovascular;  Laterality: N/A;  . COLONOSCOPY WITH PROPOFOL N/A 12/01/2017   Procedure: COLONOSCOPY WITH PROPOFOL;  Surgeon: Lin Landsman, MD;  Location: [place];  Service: Endoscopy;  Laterality: N/A;  Diabetic - insulin  . COLONOSCOPY WITH PROPOFOL N/A 12/29/2017   Procedure: COLONOSCOPY WITH PROPOFOL;  Surgeon: Lin Landsman, MD;  Location: [place];  Service: Endoscopy;  Laterality: N/A;  . DIALYSIS/PERMA CATHETER INSERTION N/A 01/18/2017   Procedure: Dialysis/Perma Catheter Insertion;  Surgeon: Algernon Huxley, MD;  Location: [place] [facility];  Service: Cardiovascular;  Laterality: N/A;  . DIALYSIS/PERMA CATHETER REMOVAL N/A 07/26/2017   Procedure: DIALYSIS/PERMA CATHETER REMOVAL;  Surgeon: Algernon Huxley, MD;  Location: [place] [facility];  Service: Cardiovascular;  Laterality: N/A;  . DIALYSIS/PERMA CATHETER REMOVAL N/A 10/24/2019   Procedure: DIALYSIS/PERMA CATHETER REMOVAL;  Surgeon: Katha Cabal, MD;  Location: [place] [facility];  Service:  Cardiovascular;  Laterality: N/A;  . INCISION AND DRAINAGE OF WOUND Right ~ 10/2014   "4th toe foot"  . IRRIGATION AND DEBRIDEMENT FOOT Left 07/19/2019   Procedure: IRRIGATION AND DEBRIDEMENT FOOT;  Surgeon: Sharlotte Alamo, DPM;  Location: [facility];  Service: Podiatry;  Laterality: Left;  . LOWER EXTREMITY ANGIOGRAPHY Left 05/23/2019   Procedure:  Lower Extremity Angiography;  Surgeon: Katha Cabal, MD;  Location: [place] [facility];  Service: Cardiovascular;  Laterality: Left;  . LOWER EXTREMITY ANGIOGRAPHY Left 07/18/2019   Procedure: LOWER EXTREMITY ANGIOGRAPHY;  Surgeon: Katha Cabal, MD;  Location: [place] [facility];  Service: Cardiovascular;  Laterality: Left;  . LOWER EXTREMITY ANGIOGRAPHY Right 08/01/2019   Procedure: LOWER EXTREMITY ANGIOGRAPHY;  Surgeon: Katha Cabal, MD;  Location: [place] [facility];  Service: Cardiovascular;  Laterality: Right;  . LOWER EXTREMITY ANGIOGRAPHY Right 08/04/2019   Procedure: Lower Extremity Angiography (Pedal Approach);  Surgeon: Katha Cabal, MD;  Location: [place] [facility];  Service: Cardiovascular;  Laterality: Right;  . PERCUTANEOUS CORONARY STENT INTERVENTION (PCI-S) N/A 12/14/2014   Procedure: PERCUTANEOUS CORONARY STENT INTERVENTION (PCI-S);  Surgeon: Charolette Forward, MD;  Location: [facility] C[facility];  Service: Cardiovascular;  Laterality: N/A;  . PERCUTANEOUS CORONARY STENT INTERVENTION (PCI-S) N/A 12/18/2014   Procedure: PERCUTANEOUS CORONARY STENT INTERVENTION (PCI-S);  Surgeon: Charolette Forward, MD;  Location: [facility];  Service: Cardiovascular;  Laterality: N/A;  . POLYPECTOMY  12/01/2017   Procedure: POLYPECTOMY;  Surgeon: Lin Landsman, MD;  Location: [place];  Service: Endoscopy;;  . POLYPECTOMY  12/29/2017   Procedure: POLYPECTOMY;  Surgeon: Lin Landsman, MD;  Location: [place];  Service: Endoscopy;;  . TOE AMPUTATION Right 2015   4th toe    FHx:  Family History  Problem Relation Age of Onset  . Cancer Mother        Lung  . Cancer Father        Lung  . Diabetes Sister   . Diabetes Brother   . CAD Brother   . Hernia Son   . Cancer Brother     Social History:  reports that he quit smoking about 5 years ago. His smoking use included cigarettes. He started smoking about 35 years ago. He has a 30.00 pack-year smoking  history. He has never used smokeless tobacco. He reports previous alcohol use. He reports that he does not use drugs.  Allergies: No Known Allergies  Review of Systems: General ROS: negative Respiratory ROS: no cough, shortness of breath, or wheezing Cardiovascular ROS: no chest pain or dyspnea on exertion Gastrointestinal ROS: no abdominal pain, change in bowel habits, or black or bloody stools Musculoskeletal ROS: positive for - joint pain and joint swelling Neurological ROS: positive for - numbness/tingling Dermatological ROS: positive for wounds both feet/gangrene  Medications Prior to Admission  Medication Sig Dispense Refill  . acetaminophen (TYLENOL) 500 MG tablet Take 500-1,000 mg by mouth every 6 (six) hours as needed for mild pain or headache.    . albuterol (PROVENTIL HFA;VENTOLIN HFA) 108 (90 BASE) MCG/ACT inhaler Inhale 2 puffs into the lungs every 6 (six) hours as needed for wheezing or shortness of breath. 1 Inhaler 2  . aspirin EC 81 MG tablet Take 1 tablet (81 mg total) by mouth daily. 60 tablet 1  . atorvastatin (LIPITOR) 40 MG tablet Take 40 mg by mouth daily.    . furosemide (LASIX) 80 MG tablet Take 80 mg by mouth daily.    . insulin  aspart protamine- aspart (NOVOLOG MIX 70/30) (70-30) 100 UNIT/ML injection Inject 0.25 mLs (25 Units total) into the skin 2 (two) times daily with a meal. (Patient taking differently: Inject 2 Units into the skin 2 (two) times daily as needed (blood sugar of 140 or higher). ) 20 mL 1    Physical Exam: General: Alert and oriented.  No apparent distress.  Vascular: DP/PT pulses nonpalpable bil, no hair growth to digits.  Some chronic edema in both lower extremities and feet but no significant localized erythema  Neuro: Light touch sensation reduced BLE.  Derm: Dry gangrenous changes distally to remaining digits of both feet with no SOI present, appear dry and stable. Exposed bone is noted at the head of the proximal phalanx on the right  third toe as well as the left fourth toe.  Full-thickness fibrotic ulcerations are noted at the fifth metatarsal head bilateral down to the level of the capsule of the fifth metatarsal phalangeal joint on the right.  Previous first ray resections have continued full-thickness ulcerations down to the level of the bone on the left.  Also a continued full-thickness ulceration on the posterior aspect of the right heel approximately 2 cm x 1.5 cm with central devitalized and fibrotic tissue, some necrosis.  All wounds appear stable with no acute SOI.  MSK: Mulitple digital/ray amputations both feet.  Results for orders placed or performed during the hospital encounter of 01/12/20 (from the past 48 hour(s))  Troponin I (High Sensitivity)     Status: Abnormal   Collection Time: 01/13/20  3:21 PM  Result Value Ref Range   Troponin I (High Sensitivity) 49 (H) <18 ng/L    Comment: (NOTE) Elevated high sensitivity troponin I (hsTnI) values and significant  changes across serial measurements may suggest ACS but many other  chronic and acute conditions are known to elevate hsTnI results.  Refer to the "Links" section for chest pain algorithms and additional  guidance. Performed at [facility], [address] [place], [place] [address]   Hepatitis B surface antigen     Status: None   Collection Time: 01/13/20  4:46 PM  Result Value Ref Range   Hepatitis B Surface Ag NON REACTIVE NON REACTIVE    Comment: Performed at [facility] [address] [place], [place] [address]  Hepatitis B surface antibody     Status: Abnormal   Collection Time: 01/13/20  4:46 PM  Result Value Ref Range   Hep B S Ab Reactive (A) NON REACTIVE    Comment: (NOTE) Consistent with immunity, greater than 9.9 mIU/mL. Performed at [facility], [place] [address] [place], [place] [address]   Hepatitis B core antibody, total     Status: None   Collection Time: 01/13/20  4:46 PM  Result Value Ref Range    Hep B Core Total Ab NON REACTIVE NON REACTIVE    Comment: Performed at [facility] [address], [place], [place] [address]  Hepatitis C antibody     Status: None   Collection Time: 01/13/20  4:46 PM  Result Value Ref Range   HCV Ab NON REACTIVE NON REACTIVE    Comment: (NOTE) Nonreactive HCV antibody screen is consistent with no HCV infections,  unless recent infection is suspected or other evidence exists to indicate HCV infection. Performed at [facility], [place] [address], [place], [place] [address]   Urinalysis, Routine w reflex microscopic     Status: Abnormal  Collection Time: 01/14/20  3:30 AM  Result Value Ref Range   Color, Urine YELLOW (A) YELLOW   APPearance HAZY (A) CLEAR   Specific Gravity, Urine 1.015 1.005 - 1.030   pH 5.0 5.0 - 8.0   Glucose, UA NEGATIVE NEGATIVE mg/dL   Hgb urine dipstick MODERATE (A) NEGATIVE   Bilirubin Urine NEGATIVE NEGATIVE   Ketones, ur 5 (A) NEGATIVE mg/dL   Protein, ur 30 (A) NEGATIVE mg/dL   Nitrite NEGATIVE NEGATIVE   Leukocytes,Ua MODERATE (A) NEGATIVE   RBC / HPF 21-50 0 - 5 RBC/hpf   WBC, UA 11-20 0 - 5 WBC/hpf   Bacteria, UA RARE (A) NONE SEEN   Squamous Epithelial / LPF 0-5 0 - 5   Mucus PRESENT     Comment: Performed at [facility], [address], [place], [place] [address]  Urine Culture     Status: None   Collection Time: 01/14/20  3:30 AM   Specimen: Urine, Random  Result Value Ref Range   Specimen Description      URINE, RANDOM Performed at [facility], [address] [place], [place] [address]    Special Requests      NONE Performed at [facility], [address] [place], [place] [address]    Culture      NO GROWTH Performed at [facility], [place] [address] [place], [place] [address]    Report Status 01/15/2020 FINAL   CBC     Status: Abnormal   Collection Time: 01/14/20  5:59 AM  Result Value Ref Range   WBC 7.0 4.0 - 10.5 K/uL   RBC 2.73 (L) 4.22 -  5.81 MIL/uL   Hemoglobin 8.2 (L) 13.0 - 17.0 g/dL   HCT 26.9 (L) 39.0 - 52.0 %   MCV 98.5 80.0 - 100.0 fL   MCH 30.0 26.0 - 34.0 pg   MCHC 30.5 30.0 - 36.0 g/dL   RDW 16.9 (H) 11.5 - 15.5 %   Platelets 237 150 - 400 K/uL   nRBC 0.0 0.0 - 0.2 %    Comment: Performed at [facility], [address], [place], [place] [address]  Basic metabolic panel     Status: Abnormal   Collection Time: 01/14/20  5:59 AM  Result Value Ref Range   Sodium 140 135 - 145 mmol/L   Potassium 3.7 3.5 - 5.1 mmol/L   Chloride 103 98 - 111 mmol/L   CO2 25 22 - 32 mmol/L   Glucose, Bld 88 70 - 99 mg/dL    Comment: Glucose reference range applies only to samples taken after fasting for at least 8 hours.   BUN 49 (H) 8 - 23 mg/dL   Creatinine, Ser 6.06 (H) 0.61 - 1.24 mg/dL   Calcium 7.2 (L) 8.9 - 10.3 mg/dL   GFR calc non Af Amer 9 (L) >60 mL/min   GFR calc Af Amer 11 (L) >60 mL/min   Anion gap 12 5 - 15    Comment: Performed at [facility], [address] [place], [place] [address]   No results found.  Blood pressure (!) 90/52, pulse 80, temperature 98.2 F (36.8 C), temperature source Oral, resp. rate 16, height 5\' 9"  (1.753 m), weight 110.5 kg, SpO2 97 %.  Assessment 1. Dry gangrene both feet 2. DM2 polyneuropathy 3. Severe PVD  Plan -Patient seen and examined. -Necrosis of feet in multiple areas. -Recommend Betadine wet-to-dry dressings every other day.  Continue local wound care.  Recommend usage of Prevalon boots as well.  Appreciate wound care recs. -Appreciate vascular recommendations and agree that there is no further intervention that can be performed for reperfusion.  Do not believe patient has enough flow to heal any further foot amputations/wounds.  Patient will eventually need more proximal amputations to both side but for now is to keep everything clean, dry, and stable.  If infection does occur then it would require a more proximal amputation. -No urgent amputation  needs to be done at this time but would be the next option if anything does become infected.  WBC 8.5, afebrile  Podiatry team to sign off at this time.  Patient to continue care with [name] as outpatient with [facility].  Caroline More, DPM 01/15/2020, 1:08 PM

## 2020-01-15 NOTE — Progress Notes (Signed)
1        Scott [name] NAME: Scott [name]    MR#:  [id number]  DATE OF BIRTH:  02-06-1961  SUBJECTIVE:  CHIEF COMPLAINT:   Chief Complaint  Patient presents with  . Shortness of Breath  remains Hypotensive, swollen, tired, waiting for tunneled dialysis catheter  REVIEW OF SYSTEMS:  Review of Systems  Constitutional: Positive for malaise/fatigue. Negative for diaphoresis, fever and weight loss.  HENT: Negative for ear discharge, ear pain, hearing loss, nosebleeds, sore throat and tinnitus.   Eyes: Negative for blurred vision and pain.  Respiratory: Negative for cough, hemoptysis, shortness of breath and wheezing.   Cardiovascular: Positive for leg swelling. Negative for chest pain, palpitations and orthopnea.  Gastrointestinal: Negative for abdominal pain, blood in stool, constipation, diarrhea, heartburn, nausea and vomiting.  Genitourinary: Negative for dysuria, frequency and urgency.  Musculoskeletal: Negative for back pain and myalgias.  Skin: Negative for itching and rash.  Neurological: Negative for dizziness, tingling, tremors, focal weakness, seizures, weakness and headaches.  Psychiatric/Behavioral: Negative for depression. The patient is not nervous/anxious.    DRUG ALLERGIES:  No Known Allergies VITALS:  Blood pressure (!) 84/39, pulse 84, temperature 97.7 F (36.5 C), temperature source Oral, resp. rate 17, height 5\' 9"  (1.753 m), weight 110.5 kg, SpO2 96 %. PHYSICAL EXAMINATION:  Physical Exam HENT:     Head: Normocephalic and atraumatic.  Eyes:     Conjunctiva/sclera: Conjunctivae normal.     Pupils: Pupils are equal, round, and reactive to light.  Neck:     Thyroid: No thyromegaly.     Trachea: No tracheal deviation.  Cardiovascular:     Rate and Rhythm: Normal rate and regular rhythm.     Heart sounds: Normal heart sounds.  Pulmonary:     Effort: Pulmonary effort is normal. No respiratory distress.     Breath sounds:  Examination of the right-lower field reveals decreased breath sounds. Examination of the left-lower field reveals decreased breath sounds. Decreased breath sounds present. No wheezing.  Chest:     Chest wall: No tenderness.  Abdominal:     General: Bowel sounds are normal. There is distension.     Palpations: Abdomen is soft.     Tenderness: There is no abdominal tenderness.     Comments: PD catheter in place.  Musculoskeletal:        General: Normal range of motion.     Cervical back: Normal range of motion and neck supple.  Skin:    General: Skin is warm and dry.     Findings: No rash.  Neurological:     Mental Status: He is alert and oriented to person, place, and time.     Cranial Nerves: No cranial nerve deficit.    LABORATORY PANEL:  Male CBC Recent Labs  Lab 01/14/20 0559  WBC 7.0  HGB 8.2*  HCT 26.9*  PLT 237   ------------------------------------------------------------------------------------------------------------------ Chemistries  Recent Labs  Lab 01/12/20 2119 01/13/20 0009 01/14/20 0559  NA 136   < > 140  K 4.1   < > 3.7  CL 102   < > 103  CO2 22   < > 25  GLUCOSE 146*   < > 88  BUN 52*   < > 49*  CREATININE 6.60*   < > 6.06*  CALCIUM 7.4*   < > 7.2*  AST 18  --   --   ALT 13  --   --   ALKPHOS 122  --   --  BILITOT 0.8  --   --    < > = values in this interval not displayed.   RADIOLOGY:  PERIPHERAL VASCULAR CATHETERIZATION  Result Date: 01/15/2020 See op note  ASSESSMENT AND PLAN:  Scott [name] is a 59 y.o. male with medical history significant for ESRD on peritoneal dialysis, chronic systolic CHF (EF 95-63% by TTE 09/18/2018), CAD, insulin-dependent type 2 diabetes, anemia of chronic disease, hypertension, hyperlipidemia, peripheral vascular disease who is admitted with acute respiratory failure with hypoxia due to acute on chronic systolic CHF in setting of ESRD on PD.  Acute respiratory failure with hypoxia due to acute on chronic  systolic CHF in setting of ESRD on peritoneal dialysis: Requiring 4 L oxygen via nasal cannula   End Stage Renal Disease:  Status post temporary dialysis catheter by vascular surgery on 5/15 and 1 session of hemo dialysis -Vascular for tunneled dialysis catheter planned for today 5/17 -Dialysis per nephrology  CAD: Denies any chest pain. High-sensitivity troponin I mildly elevated at 60. Suspect demand from acute on chronic CHF.  -Continue aspirin and statin  Anemia of chronic kidney disease: stable without obvious bleeding.  EPO per nephrology at dialysis  Insulin-dependent type 2 diabetes: - sensitive SSI while in hospital and adjust as needed.  Hypotension with a history of hypertension. Monitor blood pressure. Midodrine 5 mg TID, lasix D/Ced  Peripheral vascular disease: Continue aspirin and statin.  Appreciate podiatry & vascular input.  No intervention planned  Hyperlipidemia: Continue atorvastatin  Nonhealing vascular wounds to bilateral feet.  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  Remaining toes are necrotic and nonintact on plantar surface - Present on admission - appreciate WOC nurse eval and input - Wound type: Vascualr wounds and pressure/moisture to buttocks.  - Measurement:sacrum:  3 cm x 2.4 cm slough to wound bed - dressing changes per [place] team    Status is: Inpatient  Remains inpatient appropriate because:Ongoing diagnostic testing needed not appropriate for outpatient work up   Dispo: The patient is from: Home              Anticipated d/c is to: Home              Anticipated d/c date is: 2 days              Patient currently is not medically stable to d/c.ongoing HD per nephro for fluid management/anasarca    DVT prophylaxis: Heparin Family Communication: Discussed with patient   All the records are reviewed and case discussed with Care Management/Social Worker. Management plans discussed with the patient, nursing, nephrology and they  are in agreement.  CODE STATUS: Full Code  TOTAL TIME TAKING CARE OF THIS PATIENT: 35 minutes.   More than 50% of the time was spent in counseling/coordination of care: YES  POSSIBLE D/C IN 2-3 DAYS, DEPENDING ON CLINICAL CONDITION.   Max Sane M.D on 01/15/2020 at 5:18 PM  [facility]   CC: Primary care physician; [employer]  Note: This dictation was prepared with Diplomatic Services operational officer dictation along with smaller Company secretary. Any transcriptional errors that result from this process are unintentional.

## 2020-01-15 NOTE — Plan of Care (Signed)

## 2020-01-15 NOTE — Progress Notes (Signed)
[facility], Per Dr.Kolluru patient is transitioning to HD and will need a chair time at [facility]. Sending referral to request chair time.

## 2020-01-15 NOTE — Care Management Important Message (Signed)
Important Message  Patient Details  Name: Scott [name] MRN: [id number] Date of Birth: 1961-03-21   Medicare Important Message Given:  Yes     Dannette Barbara 01/15/2020, 2:49 PM

## 2020-01-16 ENCOUNTER — Inpatient Hospital Stay: Payer: Medicare Other

## 2020-01-16 ENCOUNTER — Encounter: Payer: Self-pay | Admitting: Cardiology

## 2020-01-16 ENCOUNTER — Inpatient Hospital Stay (HOSPITAL_COMMUNITY)
Admit: 2020-01-16 | Discharge: 2020-01-16 | Disposition: A | Discharge status: Home / Self Care | Payer: Medicare Other | Attending: Nephrology | Admitting: Nephrology

## 2020-01-16 DIAGNOSIS — I5023 Acute on chronic systolic (congestive) heart failure: Secondary | ICD-10-CM

## 2020-01-16 LAB — CBC
HCT: 27.9 % — ABNORMAL LOW (ref 39.0–52.0)
Hemoglobin: 8.5 g/dL — ABNORMAL LOW (ref 13.0–17.0)
MCH: 30.2 pg (ref 26.0–34.0)
MCHC: 30.5 g/dL (ref 30.0–36.0)
MCV: 99.3 fL (ref 80.0–100.0)
Platelets: 245 10*3/uL (ref 150–400)
RBC: 2.81 MIL/uL — ABNORMAL LOW (ref 4.22–5.81)
RDW: 16.7 % — ABNORMAL HIGH (ref 11.5–15.5)
WBC: 9.5 10*3/uL (ref 4.0–10.5)
nRBC: 0 % (ref 0.0–0.2)

## 2020-01-16 LAB — BASIC METABOLIC PANEL
Anion gap: 11 (ref 5–15)
BUN: 41 mg/dL — ABNORMAL HIGH (ref 8–23)
CO2: 28 mmol/L (ref 22–32)
Calcium: 7 mg/dL — ABNORMAL LOW (ref 8.9–10.3)
Chloride: 100 mmol/L (ref 98–111)
Creatinine, Ser: 6.05 mg/dL — ABNORMAL HIGH (ref 0.61–1.24)
GFR calc Af Amer: 11 mL/min — ABNORMAL LOW (ref >60)
GFR calc non Af Amer: 9 mL/min — ABNORMAL LOW (ref >60)
Glucose, Bld: 114 mg/dL — ABNORMAL HIGH (ref 70–99)
Potassium: 3.7 mmol/L (ref 3.5–5.1)
Sodium: 139 mmol/L (ref 135–145)

## 2020-01-16 LAB — ECHOCARDIOGRAM COMPLETE
Height: 69 in
Weight: 3897.73 oz

## 2020-01-16 MED ORDER — HYDROCODONE-ACETAMINOPHEN 5-325 MG PO TABS
1.0000 | ORAL_TABLET | Freq: Four times a day (QID) | ORAL | Status: DC | PRN
  Administered 2020-01-17: 1 via ORAL
  Filled 2020-01-16: qty 1

## 2020-01-16 MED ORDER — IOHEXOL 350 MG/ML SOLN
100.0000 mL | Freq: Once | INTRAVENOUS | Status: AC | PRN
  Administered 2020-01-16: 100 mL via INTRAVENOUS

## 2020-01-16 MED ORDER — MIDODRINE HCL 5 MG PO TABS
10.0000 mg | ORAL_TABLET | Freq: Three times a day (TID) | ORAL | Status: DC
  Administered 2020-01-16 – 2020-01-21 (×14): 10 mg via ORAL
  Filled 2020-01-16 (×13): qty 2

## 2020-01-16 MED ORDER — ALBUMIN HUMAN 25 % IV SOLN
25.0000 g | Freq: Once | INTRAVENOUS | Status: AC
  Administered 2020-01-16: 25 g via INTRAVENOUS
  Filled 2020-01-16: qty 100

## 2020-01-16 MED ORDER — OXYCODONE HCL 5 MG PO TABS
5.0000 mg | ORAL_TABLET | Freq: Once | ORAL | Status: AC
  Administered 2020-01-16: 5 mg via ORAL
  Filled 2020-01-16: qty 1

## 2020-01-16 MED ORDER — HYDROCODONE-ACETAMINOPHEN 7.5-325 MG PO TABS
1.0000 | ORAL_TABLET | Freq: Four times a day (QID) | ORAL | Status: DC | PRN
  Administered 2020-01-18: 1 via ORAL
  Filled 2020-01-16 (×2): qty 1

## 2020-01-16 MED ORDER — ALBUMIN HUMAN 25 % IV SOLN
25.0000 g | Freq: Once | INTRAVENOUS | Status: DC
  Filled 2020-01-16: qty 100

## 2020-01-16 NOTE — Progress Notes (Signed)
Patients BP is 75/54, notified Dr. Manuella Ghazi , patient has had soft BP's today and yesterday

## 2020-01-16 NOTE — Progress Notes (Signed)
Patients BP prealbumin IV is 97/41, Post Albumin is 83/30. New order for hemodialysis today, patient is alert with periods of sleeping states " I did not sleep last night". Bed bath given . Dressings changed to right foot d/t it slipped off during bath.

## 2020-01-16 NOTE — Progress Notes (Signed)
Patient off of floor to dialysis.

## 2020-01-16 NOTE — Progress Notes (Signed)
   01/16/20 0810  Assess: MEWS Score  Temp 98.4 F (36.9 C)  BP (!) 75/36  Pulse Rate 70  Resp 18  SpO2 97 %  Assess: MEWS Score  MEWS Temp 0  MEWS Systolic 2  MEWS Pulse 0  MEWS RR 0  MEWS LOC 0  MEWS Score 2  MEWS Score Color Yellow  Assess: if the MEWS score is Yellow or Red  Were vital signs taken at a resting state? Yes  Focused Assessment Documented focused assessment  Early Detection of Sepsis Score *See Row Information* Medium  MEWS guidelines implemented *See Row Information* Yes  Treat  MEWS Interventions Escalated (See documentation below)  Take Vital Signs  Increase Vital Sign Frequency  Yellow: Q 2hr X 2 then Q 4hr X 2, if remains yellow, continue Q 4hrs  Escalate  MEWS: Escalate Yellow: discuss with charge nurse/RN and consider discussing with provider and RRT  Notify: Charge Nurse/RN  Name of Charge Nurse/RN Notified Letitia Caul , RN  Date Charge Nurse/RN Notified 01/16/20  Time Charge Nurse/RN Notified [phone]  Notify: Provider  Provider Name/Title Dr, Manuella Ghazi  Date Provider Notified 01/16/20  Time Provider Notified [phone]  Notification Type Page  Notification Reason Change in status  Response No new orders  Date of Provider Response 01/16/20  Time of Provider Response [phone]

## 2020-01-16 NOTE — Progress Notes (Signed)
Patient decided to stay with [facility] for HD. Switched referral to this clinic. Waiting on a chair time, patient will be on a TTS schedule.

## 2020-01-16 NOTE — Progress Notes (Signed)
1        [place] [facility] [place] NAME: Scott [name]    MR#:  [id number]  DATE OF BIRTH:  12/11/1960  SUBJECTIVE:  CHIEF COMPLAINT:   Chief Complaint  Patient presents with  . Shortness of Breath  sob +, hypoxia needing 4 liters O2, persistent hypotension REVIEW OF SYSTEMS:  Review of Systems  Constitutional: Positive for malaise/fatigue. Negative for diaphoresis, fever and weight loss.  HENT: Negative for ear discharge, ear pain, hearing loss, nosebleeds, sore throat and tinnitus.   Eyes: Negative for blurred vision and pain.  Respiratory: Positive for shortness of breath. Negative for cough, hemoptysis and wheezing.   Cardiovascular: Positive for leg swelling. Negative for chest pain, palpitations and orthopnea.  Gastrointestinal: Negative for abdominal pain, blood in stool, constipation, diarrhea, heartburn, nausea and vomiting.  Genitourinary: Negative for dysuria, frequency and urgency.  Musculoskeletal: Negative for back pain and myalgias.  Skin: Negative for itching and rash.  Neurological: Negative for dizziness, tingling, tremors, focal weakness, seizures, weakness and headaches.  Psychiatric/Behavioral: Negative for depression. The patient is not nervous/anxious.    DRUG ALLERGIES:  No Known Allergies VITALS:  Blood pressure (!) 89/53, pulse 79, temperature 97.7 F (36.5 C), temperature source Oral, resp. rate 11, height 5\' 9"  (1.753 m), weight 110.5 kg, SpO2 100 %. PHYSICAL EXAMINATION:  Physical Exam HENT:     Head: Normocephalic and atraumatic.  Eyes:     Conjunctiva/sclera: Conjunctivae normal.     Pupils: Pupils are equal, round, and reactive to light.  Neck:     Thyroid: No thyromegaly.     Trachea: No tracheal deviation.  Cardiovascular:     Rate and Rhythm: Normal rate and regular rhythm.     Heart sounds: Normal heart sounds.  Pulmonary:     Effort: Pulmonary effort is normal. No respiratory distress.     Breath sounds:  Examination of the right-lower field reveals decreased breath sounds. Examination of the left-lower field reveals decreased breath sounds. Decreased breath sounds present. No wheezing.  Chest:     Chest wall: No tenderness.  Abdominal:     General: Bowel sounds are normal. There is distension.     Palpations: Abdomen is soft.     Tenderness: There is no abdominal tenderness.     Comments: PD catheter in place.  Musculoskeletal:        General: Normal range of motion.     Cervical back: Normal range of motion and neck supple.  Skin:    General: Skin is warm and dry.     Findings: No rash.  Neurological:     Mental Status: He is alert and oriented to person, place, and time.     Cranial Nerves: No cranial nerve deficit.    LABORATORY PANEL:  Male CBC Recent Labs  Lab 01/16/20 0401  WBC 9.5  HGB 8.5*  HCT 27.9*  PLT 245   ------------------------------------------------------------------------------------------------------------------ Chemistries  Recent Labs  Lab 01/12/20 2119 01/13/20 0009 01/16/20 0401  NA 136   < > 139  K 4.1   < > 3.7  CL 102   < > 100  CO2 22   < > 28  GLUCOSE 146*   < > 114*  BUN 52*   < > 41*  CREATININE 6.60*   < > 6.05*  CALCIUM 7.4*   < > 7.0*  AST 18  --   --   ALT 13  --   --   ALKPHOS 122  --   --  BILITOT 0.8  --   --    < > = values in this interval not displayed.   RADIOLOGY:  ECHOCARDIOGRAM COMPLETE  Result Date: 01/16/2020    ECHOCARDIOGRAM REPORT   Patient Name:   ZAYDIN BILLEY Toor Date of Exam: 01/16/2020 Medical Rec #:  [id number]        Height:       69.0 in Accession #:    [id number]       Weight:       243.6 lb Date of Birth:  07-10-1961         BSA:          2.247 m Patient Age:    40 years         BP:           97/41 mmHg Patient Gender: M                HR:           78 bpm. Exam Location:  [facility] Procedure: 2D Echo, Color Doppler and Cardiac Doppler Indications:     I50.9 Congestive Heart Failure  History:         Patient has  prior history of Echocardiogram examinations, most                  recent 09/18/2018. CHF, CAD, PVD, ESRD stage IV; Risk                  Factors:Diabetes, Hypertension and HCL.  Sonographer:     Charmayne Sheer RDCS (AE) Referring Phys:  [id number] [facility] Diagnosing Phys: Nelva Bush MD IMPRESSIONS  1. Left ventricular ejection fraction, by estimation, is 25 to 30%. The left ventricle has severely decreased function. The left ventricle demonstrates global hypokinesis. The left ventricular internal cavity size was moderately dilated. There is mild left ventricular hypertrophy. Left ventricular diastolic parameters are consistent with Grade II diastolic dysfunction (pseudonormalization). Elevated left atrial pressure.  2. Pulmonary artery pressure is at least mildly elevated (PASP 30-35 mmHg plus central venous pressure). Right ventricular systolic function is moderately reduced. The right ventricular size is mildly enlarged.  3. Left atrial size was mildly dilated.  4. Right atrial size was moderately dilated.  5. The mitral valve is normal in structure. Trivial mitral valve regurgitation. No evidence of mitral stenosis.  6. The aortic valve is tricuspid. Aortic valve regurgitation is not visualized. Mild aortic valve sclerosis is present, with no evidence of aortic valve stenosis.  7. Mildly dilated pulmonary artery. Comparison(s): A prior study was performed on 09/18/2018. LVEF has decreased from 35-40% to 25-30%. FINDINGS  Left Ventricle: Left ventricular ejection fraction, by estimation, is 25 to 30%. The left ventricle has severely decreased function. The left ventricle demonstrates global hypokinesis. The left ventricular internal cavity size was moderately dilated. There is mild left ventricular hypertrophy. Left ventricular diastolic parameters are consistent with Grade II diastolic dysfunction (pseudonormalization). Elevated left atrial pressure. Right Ventricle: Pulmonary artery pressure is at least  mildly elevated (PASP 30-35 mmHg plus central venous pressure). The right ventricular size is mildly enlarged. No increase in right ventricular wall thickness. Right ventricular systolic function is moderately reduced. Left Atrium: Left atrial size was mildly dilated. Right Atrium: Right atrial size was moderately dilated. Pericardium: Trivial pericardial effusion is present. The pericardial effusion is posterior to the left ventricle. Mitral Valve: The mitral valve is normal in structure. Trivial mitral valve regurgitation. No evidence of mitral valve stenosis. MV peak gradient,  4.2 mmHg. The mean mitral valve gradient is 2.0 mmHg. Tricuspid Valve: The tricuspid valve is grossly normal. Tricuspid valve regurgitation is mild. Aortic Valve: The aortic valve is tricuspid. . There is moderate thickening and mild calcification of the aortic valve. Aortic valve regurgitation is not visualized. Mild aortic valve sclerosis is present, with no evidence of aortic valve stenosis. There  is moderate thickening of the aortic valve. There is mild calcification of the aortic valve. Aortic valve mean gradient measures 4.0 mmHg. Aortic valve peak gradient measures 8.1 mmHg. Aortic valve area, by VTI measures 1.83 cm. Pulmonic Valve: The pulmonic valve was grossly normal. Pulmonic valve regurgitation is trivial. No evidence of pulmonic stenosis. Aorta: The aortic root is normal in size and structure. Pulmonary Artery: The pulmonary artery is mildly dilated. Venous: The inferior vena cava was not well visualized. IAS/Shunts: The interatrial septum was not well visualized.  LEFT VENTRICLE PLAX 2D LVIDd:         6.00 cm      Diastology LVIDs:         5.39 cm      LV e' lateral:   5.77 cm/s LV PW:         0.94 cm      LV E/e' lateral: 14.9 LV IVS:        1.11 cm      LV e' medial:    3.59 cm/s LVOT diam:     2.10 cm      LV E/e' medial:  23.9 LV SV:         41 LV SV Index:   18 LVOT Area:     3.46 cm  LV Volumes (MOD) LV vol d, MOD  A2C: 197.0 ml LV vol d, MOD A4C: 174.0 ml LV vol s, MOD A2C: 142.0 ml LV vol s, MOD A4C: 131.0 ml LV SV MOD A2C:     55.0 ml LV SV MOD A4C:     174.0 ml LV SV MOD BP:      48.8 ml RIGHT VENTRICLE RV Basal diam:  3.64 cm LEFT ATRIUM             Index       RIGHT ATRIUM           Index LA diam:        4.40 cm 1.96 cm/m  RA Area:     26.30 cm LA Vol (A2C):   76.6 ml 34.10 ml/m RA Volume:   97.90 ml  43.58 ml/m LA Vol (A4C):   66.1 ml 29.42 ml/m LA Biplane Vol: 74.7 ml 33.25 ml/m  AORTIC VALVE                   PULMONIC VALVE AV Area (Vmax):    1.98 cm    PV Vmax:       0.99 m/s AV Area (Vmean):   1.87 cm    PV Vmean:      57.200 cm/s AV Area (VTI):     1.83 cm    PV VTI:        0.159 m AV Vmax:           142.00 cm/s PV Peak grad:  3.9 mmHg AV Vmean:          88.700 cm/s PV Mean grad:  2.0 mmHg AV VTI:            0.221 m AV Peak Grad:      8.1 mmHg AV Mean  Grad:      4.0 mmHg LVOT Vmax:         81.30 cm/s LVOT Vmean:        48.000 cm/s LVOT VTI:          0.117 m LVOT/AV VTI ratio: 0.53  AORTA Ao Root diam: 3.40 cm MITRAL VALVE               TRICUSPID VALVE MV Area (PHT): 5.66 cm    TR Peak grad:   31.8 mmHg MV Peak grad:  4.2 mmHg    TR Vmax:        282.00 cm/s MV Mean grad:  2.0 mmHg MV Vmax:       1.02 m/s    SHUNTS MV Vmean:      67.5 cm/s   Systemic VTI:  0.12 m MV Decel Time: 134 msec    Systemic Diam: 2.10 cm MV E velocity: 85.90 cm/s MV A velocity: 83.40 cm/s MV E/A ratio:  1.03 Christopher End MD Electronically signed by Nelva Bush MD Signature Date/Time: 01/16/2020/2:27:20 PM    Final    ASSESSMENT AND PLAN:  NASIM HABEEB is a 59 y.o. male with medical history significant for ESRD on peritoneal dialysis, chronic systolic CHF (EF 50-93% by TTE 09/18/2018), CAD, insulin-dependent type 2 diabetes, anemia of chronic disease, hypertension, hyperlipidemia, peripheral vascular disease who is admitted with acute respiratory failure with hypoxia due to acute on chronic systolic CHF in setting of ESRD  on PD.  Acute respiratory failure with hypoxia due to acute on chronic systolic CHF in setting of ESRD on peritoneal dialysis: Requiring 4 L oxygen via nasal cannula - CT Angio to r/o PE - ordered 5/18   End Stage Renal Disease:  Status post temporary dialysis catheter by vascular surgery on 5/15 and 1 session of hemo dialysis -s/p tunneled dialysis catheter on 5/17, can remove temp HD cathetar now. -Daily Dialysis per nephrology for now.  Persistent hypotension, sinus tachycardia - increase dose of midodrine. echo shows severe global hypokinesis. EF 25-30% - cardio c/s - transfer to PCU for close monitoring - yellow MEWS  CAD: Denies any chest pain. High-sensitivity troponin I mildly elevated at 60. Suspect demand from acute on chronic CHF.  -Continue aspirin and statin Cardio c/s  Anemia of chronic kidney disease: stable without obvious bleeding.  EPO per nephrology at dialysis  Insulin-dependent type 2 diabetes: - sensitive SSI while in hospital and adjust as needed.  Peripheral vascular disease: Continue aspirin and statin.  Appreciate podiatry & vascular input.  No intervention planned  Hyperlipidemia: Continue atorvastatin  Nonhealing vascular wounds to bilateral feet.  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  Remaining toes are necrotic and nonintact on plantar surface - Present on admission - appreciate WOC nurse eval and input - Wound type: Vascualr wounds and pressure/moisture to buttocks.  - Measurement:sacrum:  3 cm x 2.4 cm slough to wound bed - dressing changes per WOC team  Weakness C/s PT    Patient is critically sick with persistent hypotension, hypoxia, ESRD and remains high risk for cardio-resp failure and death. Will transfer him to PCU - yellow MEWS  C/s palliative care for [place] discussion  Status is: Inpatient  Remains inpatient appropriate because:Hemodynamically unstable and Ongoing diagnostic testing needed not appropriate for  outpatient work up   Dispo: The patient is from: Home              Anticipated d/c is to: Home  Anticipated d/c date is: > 3 days              Patient currently is not medically stable to d/c.ongoing HD per nephro for fluid management/anasarca    DVT prophylaxis: Heparin Family Communication: Discussed with patient   All the records are reviewed and case discussed with Care Management/Social Worker. Management plans discussed with the patient, nursing, nephrology and they are in agreement.  CODE STATUS: Full Code  TOTAL TIME TAKING CARE OF THIS PATIENT: 35 minutes.   More than 50% of the time was spent in counseling/coordination of care: YES  POSSIBLE D/C IN 3-4 DAYS, DEPENDING ON CLINICAL CONDITION.   Max Sane M.D on 01/16/2020 at 6:57 PM  [facility]   CC: Primary care physician; [employer]  Note: This dictation was prepared with Diplomatic Services operational officer dictation along with smaller Company secretary. Any transcriptional errors that result from this process are unintentional.

## 2020-01-16 NOTE — Progress Notes (Signed)
Rapid Response Note:  Rounded on patient this AM due to hypotension. Patient is AA+Ox4, no AMS. Patient is scheduled for H/D today. Patient to receive Midodrine and Albumin this AM. Dr. Abigail Butts and Dr. Manuella Ghazi both aware. Bedside nurse denies needs at this time and is instructed to call if the patient's condition deteriorates.

## 2020-01-16 NOTE — Progress Notes (Signed)
[facility]  ROUNDING NOTE   Subjective:   No dialysis yesterday. RIJ permcath placed by [name] yesterday.   Seen and examined on hemodialysis. Given IV albumin this morning.     HEMODIALYSIS FLOWSHEET:  Blood Flow Rate (mL/min): 250 mL/min Arterial Pressure (mmHg): -80 mmHg Venous Pressure (mmHg): 60 mmHg Transmembrane Pressure (mmHg): 80 mmHg Ultrafiltration Rate (mL/min): 1400 mL/min Dialysate Flow Rate (mL/min): 500 ml/min Conductivity: Machine : 13.8 Conductivity: Machine : 13.8 Dialysis Fluid Bolus: Normal Saline Bolus Amount (mL): 250 mL    Objective:  Vital signs in last 24 hours:  Temp:  [97.6 F (36.4 C)-98.4 F (36.9 C)] 97.6 F (36.4 C) (05/18 1218) Pulse Rate:  [70-89] 76 (05/18 1218) Resp:  [8-23] 18 (05/18 0810) BP: (75-112)/(27-81) 75/54 (05/18 1218) SpO2:  [92 %-99 %] 92 % (05/18 1218) Weight:  [110.5 kg] 110.5 kg (05/17 1500)  Weight change: -0.3 kg Filed Weights   01/14/20 1742 01/15/20 0500 01/15/20 1500  Weight: 110.8 kg 110.5 kg 110.5 kg    Intake/Output: I/O last 3 completed shifts: In: 290 [P.O.:240; IV Piggyback:50] Out: 227 [Urine:100; Other:127]   Intake/Output this shift:  Total I/O In: 480 [P.O.:480] Out: -   Physical Exam: General: NAD, laying in bed  Head: Normocephalic, atraumatic. Moist oral mucosal membranes  Eyes: Anicteric, PERRL  Neck: Supple, trachea midline  Lungs:  Bilateral crackles  Heart: Regular rate and rhythm  Abdomen:  Abdominal wall edema  Extremities:  ++ peripheral edema.  Neurologic: Nonfocal, moving all four extremities  Skin: No lesions  Access: Peritoneal dialysis catheter Right arm AVF - not functioning Right femoral temp HD catheter 5/15 Dr. Lorenso Courier    Basic Metabolic Panel: Recent Labs  Lab 01/12/20 2119 01/12/20 2119 01/13/20 0009 01/13/20 0028 01/14/20 0559 01/16/20 0401  NA 136  --   --  136 140 139  K 4.1  --   --  3.7 3.7 3.7  CL 102  --   --  102 103 100  CO2 22   --   --  22 25 28   GLUCOSE 146*  --   --  124* 88 114*  BUN 52*  --   --  52* 49* 41*  CREATININE 6.60*  --  6.68* 6.63* 6.06* 6.05*  CALCIUM 7.4*   < >  --  7.2* 7.2* 7.0*   < > = values in this interval not displayed.    Liver Function Tests: Recent Labs  Lab 01/12/20 2119  AST 18  ALT 13  ALKPHOS 122  BILITOT 0.8  PROT 6.2*  ALBUMIN 2.4*   No results for input(s): LIPASE, AMYLASE in the last 168 hours. No results for input(s): AMMONIA in the last 168 hours.  CBC: Recent Labs  Lab 01/12/20 2119 01/13/20 0028 01/14/20 0559 01/16/20 0401  WBC 10.4 8.5 7.0 9.5  NEUTROABS 9.2*  --   --   --   HGB 9.3* 8.6* 8.2* 8.5*  HCT 29.9* 27.6* 26.9* 27.9*  MCV 96.1 97.2 98.5 99.3  PLT 288 268 237 245    Cardiac Enzymes: No results for input(s): CKTOTAL, CKMB, CKMBINDEX, TROPONINI in the last 168 hours.  BNP: Invalid input(s): POCBNP  CBG: No results for input(s): GLUCAP in the last 168 hours.  Microbiology: Results for orders placed or performed during the hospital encounter of 01/12/20  SARS Coronavirus 2 by RT PCR (hospital order, performed in [facility] hospital lab) Nasopharyngeal Nasopharyngeal Swab     Status: None   Collection Time:  01/12/20  9:08 PM   Specimen: Nasopharyngeal Swab  Result Value Ref Range Status   SARS Coronavirus 2 NEGATIVE NEGATIVE Final    Comment: (NOTE) SARS-CoV-2 target nucleic acids are NOT DETECTED. The SARS-CoV-2 RNA is generally detectable in upper and lower respiratory specimens during the acute phase of infection. The lowest concentration of SARS-CoV-2 viral copies this assay can detect is 250 copies / mL. A negative result does not preclude SARS-CoV-2 infection and should not be used as the sole basis for treatment or other patient management decisions.  A negative result may occur with improper specimen collection / handling, submission of specimen other than nasopharyngeal swab, presence of viral mutation(s) within the areas  targeted by this assay, and inadequate number of viral copies (<250 copies / mL). A negative result must be combined with clinical observations, patient history, and epidemiological information. Fact Sheet for Patients:   StrictlyIdeas.no Fact Sheet for Healthcare Providers: BankingDealers.co.za This test is not yet approved or cleared  by the [place] FDA and has been authorized for detection and/or diagnosis of SARS-CoV-2 by FDA under an Emergency Use Authorization (EUA).  This EUA will remain in effect (meaning this test can be used) for the duration of the COVID-19 declaration under Section 564(b)(1) of the Act, 21 U.S.C. section 360bbb-3(b)(1), unless the authorization is terminated or revoked sooner. Performed at [facility], [address] [place], [place] [address]   Body fluid culture     Status: None (Preliminary result)   Collection Time: 01/13/20 10:45 AM   Specimen: Peritoneal Washings; Body Fluid  Result Value Ref Range Status   Specimen Description   Final    PERITONEAL Performed at [facility], [address] [place], [place] [address]    Special Requests   Final    NONE Performed at [facility], [place], [place] [address]    Gram Stain NO WBC SEEN NO ORGANISMS SEEN   Final   Culture   Final    NO GROWTH 2 DAYS Performed at [facility], [place] [address] [place], [place] [address]    Report Status PENDING  Incomplete  Urine Culture     Status: None   Collection Time: 01/14/20  3:30 AM   Specimen: Urine, Random  Result Value Ref Range Status   Specimen Description   Final    URINE, RANDOM Performed at [facility], [address], [place], [place] [address]    Special Requests   Final    NONE Performed at [facility], [address] [place], [place] [address]    Culture   Final    NO GROWTH Performed at [place], [place] [address] [place], [place] [address]    Report Status 01/15/2020 FINAL  Final    Coagulation Studies: No results for input(s): LABPROT, INR in the last 72 hours.  Urinalysis: Recent Labs    01/14/20 0330  COLORURINE YELLOW*  LABSPEC 1.015  PHURINE 5.0  GLUCOSEU NEGATIVE  HGBUR MODERATE*  BILIRUBINUR NEGATIVE  KETONESUR 5*  PROTEINUR 30*  NITRITE NEGATIVE  LEUKOCYTESUR MODERATE*      Imaging: PERIPHERAL VASCULAR CATHETERIZATION  Result Date: 01/15/2020 See op note    Medications:   . sodium chloride Stopped (01/13/20 0231)   . aspirin EC  81 mg Oral Daily  . atorvastatin  40 mg Oral Daily  . calcium acetate  2,001 mg Oral TID WC  . Chlorhexidine Gluconate Cloth  6  each Topical [id number]  . collagenase   Topical Daily  . [START ON 01/17/2020] epoetin (EPOGEN/PROCRIT) injection  10,000 Units Intravenous Q M,W,F-HD  . heparin  5,000 Units Subcutaneous Q8H  . midodrine  10 mg Oral TID WC  . pantoprazole  40 mg Oral Daily  . sodium chloride flush  3 mL Intravenous Q12H   sodium chloride, acetaminophen **OR** acetaminophen, HYDROcodone-acetaminophen, HYDROcodone-acetaminophen, ondansetron **OR** ondansetron (ZOFRAN) IV, ondansetron (ZOFRAN) IV  Assessment/ Plan:  [name] is a 59 y.o. white male with end stage renal disease on peritoneal dialysis, hypertension, peripheral vascular disease, diabetes mellitus type II, asthma , left toe amputation who is admitted to [facility] on 01/12/2020 for ESRD (end stage renal disease) on dialysis ([place]) [N18.6, Z99.2] Acute respiratory failure with hypoxia ([place]) [J96.01] Congestive heart failure, unspecified HF chronicity, unspecified heart failure type ([place]) [I50.9]  [place] 99kg Outpatient Peritoneal dialysis CAPD 4 exchanges 2 liter fills  1. End Stage Renal Disease: patient is significantly volume overloaded and seem to have inadequate dialysis.  Seems to have peritoneal membrane failure He was 12 kg  above his estimated dry weight of 99kg Seen and examined on hemodialysis treatment.  Will need back up hemodialysis until down to his dry weight.  Outpatient dialysis planning for [employer].  - Daily hemodialysis  - Remove temp HD catheter.   2. Hypotensive: Holding home agents including furosemide - start midodrine.   3. Anemia with chronic kidney disease: normocytic. Hemoglobin 8.5 - ESA with HD starting on Monday.   4. Secondary Hyperparathyroidism: with hyperphosphatemia and hypocalcemia - calcium acetate with meals.   5. Peripheral vascular disease:  - appreciate wound care, podiatry and vascular  6. Anasarca with hypoalbuminemia:  - IV albumin again with HD treatment.  - Change to regular diet.    LOS: 4 Sarath Kolluru 5/18/20211:56 PM

## 2020-01-16 NOTE — Progress Notes (Signed)
Patients BP 75/36 ,. Notified Dr. Manuella Ghazi MEWS protocol started

## 2020-01-16 NOTE — Progress Notes (Signed)
*  PRELIMINARY RESULTS* Echocardiogram 2D Echocardiogram has been performed.  Scott [name] 01/16/2020, 10:47 AM

## 2020-01-16 NOTE — TOC Progression Note (Signed)
Transition of Care [facility]) - Progression Note    Patient Details  Name: EZEQUIEL MACAULEY MRN: [id number] Date of Birth: 03-31-1961  Transition of Care [facility]) CM/SW Contact  Wendelin Bradt, Gardiner Rhyme, LCSW Phone Number: 01/16/2020, 10:11 AM  Clinical Narrative: Spoke with Amanda-HD navigator who is currently working on chair slot. He did not have O2 at home but is requiring it here. Will work on discharge needs.      Expected Discharge Plan: Home/Self Care Barriers to Discharge: Continued Medical Work up  Expected Discharge Plan and Services Expected Discharge Plan: Home/Self Care In-house Referral: Clinical Social Work   Post Acute Care Choice: Dialysis Living arrangements for the past 2 months: Single Family Home                                       Social Determinants of Health (SDOH) Interventions    Readmission Risk Interventions Readmission Risk Prevention Plan 08/04/2019 07/19/2019 07/19/2019  Transportation Screening Complete Complete Complete  Medication Review [employer]) Referral to Pharmacy Complete Complete  [place] or Home Care Consult Complete Complete Complete  SW Recovery Care/Counseling Consult Complete - -  Palliative Care Screening Not Applicable Not Applicable -  [place] Not Applicable Not Applicable -  Some recent data might be hidden

## 2020-01-17 ENCOUNTER — Encounter: Payer: Self-pay | Admitting: Internal Medicine

## 2020-01-17 ENCOUNTER — Inpatient Hospital Stay: Payer: Medicare Other

## 2020-01-17 DIAGNOSIS — I255 Ischemic cardiomyopathy: Secondary | ICD-10-CM

## 2020-01-17 DIAGNOSIS — Z515 Encounter for palliative care: Secondary | ICD-10-CM

## 2020-01-17 DIAGNOSIS — I959 Hypotension, unspecified: Secondary | ICD-10-CM

## 2020-01-17 DIAGNOSIS — Z7189 Other specified counseling: Secondary | ICD-10-CM

## 2020-01-17 DIAGNOSIS — I739 Peripheral vascular disease, unspecified: Secondary | ICD-10-CM

## 2020-01-17 LAB — CBC
HCT: 26.2 % — ABNORMAL LOW (ref 39.0–52.0)
Hemoglobin: 8.2 g/dL — ABNORMAL LOW (ref 13.0–17.0)
MCH: 30.6 pg (ref 26.0–34.0)
MCHC: 31.3 g/dL (ref 30.0–36.0)
MCV: 97.8 fL (ref 80.0–100.0)
Platelets: 219 10*3/uL (ref 150–400)
RBC: 2.68 MIL/uL — ABNORMAL LOW (ref 4.22–5.81)
RDW: 16.9 % — ABNORMAL HIGH (ref 11.5–15.5)
WBC: 10.2 10*3/uL (ref 4.0–10.5)
nRBC: 0 % (ref 0.0–0.2)

## 2020-01-17 LAB — URINALYSIS, COMPLETE (UACMP) WITH MICROSCOPIC
Bacteria, UA: NONE SEEN
Bilirubin Urine: NEGATIVE
Glucose, UA: NEGATIVE mg/dL
Ketones, ur: NEGATIVE mg/dL
Nitrite: NEGATIVE
Protein, ur: 100 mg/dL — AB
Specific Gravity, Urine: 1.026 (ref 1.005–1.030)
WBC, UA: >50 WBC/hpf — ABNORMAL HIGH (ref 0–5)
pH: 5 (ref 5.0–8.0)

## 2020-01-17 LAB — BODY FLUID CULTURE
Culture: NO GROWTH
Gram Stain: NONE SEEN

## 2020-01-17 LAB — BASIC METABOLIC PANEL
Anion gap: 12 (ref 5–15)
BUN: 32 mg/dL — ABNORMAL HIGH (ref 8–23)
CO2: 28 mmol/L (ref 22–32)
Calcium: 7.4 mg/dL — ABNORMAL LOW (ref 8.9–10.3)
Chloride: 98 mmol/L (ref 98–111)
Creatinine, Ser: 4.85 mg/dL — ABNORMAL HIGH (ref 0.61–1.24)
GFR calc Af Amer: 14 mL/min — ABNORMAL LOW (ref >60)
GFR calc non Af Amer: 12 mL/min — ABNORMAL LOW (ref >60)
Glucose, Bld: 109 mg/dL — ABNORMAL HIGH (ref 70–99)
Potassium: 3.4 mmol/L — ABNORMAL LOW (ref 3.5–5.1)
Sodium: 138 mmol/L (ref 135–145)

## 2020-01-17 LAB — GLUCOSE, CAPILLARY: Glucose-Capillary: 96 mg/dL (ref 70–99)

## 2020-01-17 MED ORDER — ASCORBIC ACID 500 MG PO TABS
500.0000 mg | ORAL_TABLET | Freq: Two times a day (BID) | ORAL | Status: DC
  Administered 2020-01-17 – 2020-01-21 (×8): 500 mg via ORAL
  Filled 2020-01-17 (×7): qty 1

## 2020-01-17 MED ORDER — POTASSIUM CHLORIDE CRYS ER 20 MEQ PO TBCR
40.0000 meq | EXTENDED_RELEASE_TABLET | Freq: Once | ORAL | Status: AC
  Administered 2020-01-17: 40 meq via ORAL
  Filled 2020-01-17: qty 2

## 2020-01-17 MED ORDER — DELFLEX-LC/4.25% DEXTROSE 483 MOSM/L IP SOLN
INTRAPERITONEAL | Status: DC
  Filled 2020-01-17: qty 3000

## 2020-01-17 MED ORDER — LIDOCAINE-PRILOCAINE 2.5-2.5 % EX CREA
TOPICAL_CREAM | Freq: Once | CUTANEOUS | Status: AC
  Filled 2020-01-17: qty 5

## 2020-01-17 MED ORDER — NEPRO/CARBSTEADY PO LIQD
237.0000 mL | Freq: Two times a day (BID) | ORAL | Status: DC
  Administered 2020-01-17 – 2020-01-20 (×5): 237 mL via ORAL

## 2020-01-17 MED ORDER — GENTAMICIN SULFATE 0.1 % EX CREA
1.0000 "application " | TOPICAL_CREAM | Freq: Every day | CUTANEOUS | Status: DC
  Administered 2020-01-17: 1 via TOPICAL
  Filled 2020-01-17: qty 15

## 2020-01-17 MED ORDER — RENA-VITE PO TABS
1.0000 | ORAL_TABLET | Freq: Every day | ORAL | Status: DC
  Administered 2020-01-17 – 2020-01-20 (×4): 1 via ORAL
  Filled 2020-01-17 (×5): qty 1

## 2020-01-17 NOTE — Progress Notes (Signed)
Pt stable drowsy awakens with verbal stimuli tolerated PD well no issues initial UF 282 Total UF 89

## 2020-01-17 NOTE — Consult Note (Signed)
Consultation Note Date: 01/17/2020   Patient Name: Scott [name]  DOB: Jan 04, 1961  MRN: [id number]  Age / Sex: 59 y.o., male  [facility] Referring Physician: Jennye Boroughs, MD  Reason for Consultation: Establishing goals of care  HPI/Patient Profile: Scott [name] is a 59 y.o. male with medical history significant for ESRD on peritoneal dialysis, chronic systolic CHF (EF 40-08% by TTE 09/18/2018), CAD, insulin-dependent type 2 diabetes, anemia of chronic disease, hypertension, hyperlipidemia, peripheral vascular disease who presents to the ED for evaluation of progressive shortness of breath.  Clinical Assessment and Goals of Care: Patient is resting in bed. He states he lives with his girlfriend and his son. He states he has a daughter as well. 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   He tells me he has been having shortness of breath and "give out quick". When asked how long he has had the symptoms he states "for months and months." He states he uses a walker, but is mostly chair and bed bound due to issues with mobility.   We discussed his diagnoses, prognosis, GOC, EOL wishes disposition and options.  A detailed discussion was had today regarding advanced directives.  Concepts specific to code status, artifical feeding and hydration, IV antibiotics and rehospitalization were discussed.  The difference between an aggressive medical intervention path and a comfort care path was discussed.  Values and goals of care important to patient and family were attempted to be elicited.  Discussed limitations of medical interventions to prolong quality of life in some situations and discussed the concept of human mortality.  He discusses his diagnoses, and states he understands they are progressive. He states his quality of life is not acceptable, and states he is tired and ready to  shift to comfort based care and go home with hospice. He states he wants his son and daughter's blessing on transitioning to comfort care and would like to speak with them. I offered a family meeting which he tells me he would like to have. He attempted to call his son form his cell phone to arrange the meeting with no answer.   Attempted to call his daughter and an automated message stated the call could not be completed at this time. Called son from desk, and call was returned shortly. He states he will speak to his sister about coming to a meeting tomorrow, and will call with a time in the morning.      SUMMARY OF RECOMMENDATIONS   Patient states he is ready to shift to comfort care and go home with hospice, but wants both his children's blessing prior to initiating this plan. Son to call in the morning with family meeting time for tomorrow. Continue full code and full scope at this time.   Prognosis:   Poor overall      Primary Diagnoses: Present on Admission: . Acute respiratory failure with hypoxia ([place]) . Type 2 diabetes mellitus with other diabetic kidney complication ([place]) . PVD (peripheral vascular  disease) ([place]) . Anemia in ESRD (end-stage renal disease) ([place]) . Coronary artery disease   I have reviewed the medical record, interviewed the patient and family, and examined the patient. The following aspects are pertinent.  Past Medical History:  Diagnosis Date  . Acute respiratory failure with hypoxia ([place]) 09/17/2018  . Arthritis    "left arm; right leg" (12/13/2014)  . Asthma   . CHF (congestive heart failure) ([place])   . Chronic disease anemia    [name] 12/13/2014  . Chronic kidney disease (CKD), stage IV (severe) ([place])    [name] 12/13/2014... on dialysis  . Complication of anesthesia    unable to urinate after CAPD urgery  . Continuous ambulatory peritoneal dialysis status ([place])   . Coronary artery disease    [name] 12/13/2014  . Depression   . Dysrhythmia    patient  unaware of irregular heartbeat  . GERD (gastroesophageal reflux disease)   . High cholesterol    [name] 12/13/2014  . Hypertension   . Non-Q wave myocardial infarction ([place])    [name] 12/13/2014  . PVD (peripheral vascular disease) ([place])    [name] 12/13/2014  . Type II diabetes mellitus ([place])    Social History   Socioeconomic History  . Marital status: Significant Other    Spouse name: Not on file  . Number of children: Not on file  . Years of education: Not on file  . Highest education level: Not on file  Occupational History  . Not on file  Tobacco Use  . Smoking status: Former Smoker    Packs/day: 1.00    Years: 30.00    Pack years: 30.00    Types: Cigarettes    Start date: 08/31/1984    Quit date: 11/30/2014    Years since quitting: 5.1  . Smokeless tobacco: Never Used  Substance and Sexual Activity  . Alcohol use: Not Currently  . Drug use: No  . Sexual activity: Not Currently  Other Topics Concern  . Not on file  Social History Narrative   Lives at home with girlfriend and son   Social Determinants of Health   Financial Resource Strain:   . Difficulty of Paying Living Expenses:   Food Insecurity:   . Worried About Charity fundraiser in the Last Year:   . Arboriculturist in the Last Year:   Transportation Needs:   . Film/video editor (Medical):   [name] Lack of Transportation (Non-Medical):   Physical Activity:   . Days of Exercise per Week:   . Minutes of Exercise per Session:   Stress:   . Feeling of Stress :   Social Connections:   . Frequency of Communication with Friends and Family:   . Frequency of Social Gatherings with Friends and Family:   . Attends Religious Services:   . Active Member of Clubs or Organizations:   . Attends Archivist Meetings:   [name] Marital Status:    Family History  Problem Relation Age of Onset  . Cancer Mother        Lung  . Cancer Father        Lung  . Diabetes Sister   . Diabetes Brother   . CAD Brother    . Hernia Son   . Cancer Brother    Scheduled Meds: . vitamin C  500 mg Oral BID  . aspirin EC  81 mg Oral Daily  . atorvastatin  40 mg Oral Daily  . calcium acetate  2,001  mg Oral TID WC  . Chlorhexidine Gluconate Cloth  6 each Topical Q0600  . collagenase   Topical Daily  . epoetin (EPOGEN/PROCRIT) injection  10,000 Units Intravenous Q M,W,F-HD  . feeding supplement (NEPRO CARB STEADY)  237 mL Oral BID BM  . gentamicin cream  1 application Topical Daily  . heparin  5,000 Units Subcutaneous Q8H  . midodrine  10 mg Oral TID WC  . multivitamin  1 tablet Oral QHS  . pantoprazole  40 mg Oral Daily  . sodium chloride flush  3 mL Intravenous Q12H   Continuous Infusions: . sodium chloride Stopped (01/13/20 0231)  . albumin human Stopped (01/16/20 1932)  . dialysis solution 4.25% low-MG/low-CA     PRN Meds:.sodium chloride, acetaminophen **OR** acetaminophen, HYDROcodone-acetaminophen, HYDROcodone-acetaminophen, ondansetron **OR** ondansetron (ZOFRAN) IV, ondansetron (ZOFRAN) IV Medications Prior to Admission:  Prior to Admission medications   Medication Sig Start Date End Date Taking? Authorizing Provider  acetaminophen (TYLENOL) 500 MG tablet Take 500-1,000 mg by mouth every 6 (six) hours as needed for mild pain or headache.   Yes [provider]  albuterol (PROVENTIL HFA;VENTOLIN HFA) 108 (90 BASE) MCG/ACT inhaler Inhale 2 puffs into the lungs every 6 (six) hours as needed for wheezing or shortness of breath. 01/10/15  Yes Gladstone Lighter, MD  aspirin EC 81 MG tablet Take 1 tablet (81 mg total) by mouth daily. 07/21/19  Yes Ivor Costa, MD  atorvastatin (LIPITOR) 40 MG tablet Take 40 mg by mouth daily. 05/15/19  Yes [provider]  furosemide (LASIX) 80 MG tablet Take 80 mg by mouth daily. 12/01/18  Yes [provider]  insulin aspart protamine- aspart (NOVOLOG MIX 70/30) (70-30) 100 UNIT/ML injection Inject 0.25 mLs (25 Units total) into the skin 2 (two) times  daily with a meal. Patient taking differently: Inject 2 Units into the skin 2 (two) times daily as needed (blood sugar of 140 or higher).  03/12/18  Yes Fritzi Mandes, MD   No Known Allergies Review of Systems  Constitutional: Positive for fatigue.  Respiratory: Positive for shortness of breath.   Neurological: Positive for weakness.    Physical Exam Pulmonary:     Effort: Pulmonary effort is normal.  Neurological:     Mental Status: He is alert.     Vital Signs: BP 98/67   Pulse 84   Temp 97.8 F (36.6 C)   Resp 20   Ht 5\' 9"  (1.753 m)   Wt 108.4 kg   SpO2 97%   BMI 35.29 kg/m  Pain Scale: 0-10 POSS *See Group Information*: S-Acceptable,Sleep, easy to arouse Pain Score: 0-No pain   SpO2: SpO2: 97 % O2 Device:SpO2: 97 % O2 Flow Rate: .O2 Flow Rate (L/min): 4 L/min  IO: Intake/output summary:   Intake/Output Summary (Last 24 hours) at 01/17/2020 1454 Last data filed at 01/16/2020 2131 Gross per 24 hour  Intake 3 ml  Output 286 ml  Net -283 ml    LBM: Last BM Date: 01/15/20 Baseline Weight: Weight: 102.1 kg Most recent weight: Weight: 108.4 kg     Palliative Assessment/Data:     Time In: 2:38 Time Out: 3:10 Time Total:32 min Greater than 50%  of this time was spent counseling and coordinating care related to the above assessment and plan.  Signed by: Asencion Gowda, NP   Please contact Palliative Medicine Team phone at [phone] for questions and concerns.  For individual provider: See Shea Evans

## 2020-01-17 NOTE — Progress Notes (Signed)
[facility]  ROUNDING NOTE   Subjective:   Patient placed on peritoneal dialysis today with 4.25% dextrose solution.   Patient is not sure he wants to continue dialysis. He states he is tired of being sick.    Objective:  Vital signs in last 24 hours:  Temp:  [97.7 F (36.5 C)-98.3 F (36.8 C)] 97.8 F (36.6 C) (05/19 0741) Pulse Rate:  [71-132] 80 (05/19 1217) Resp:  [11-22] 22 (05/19 1217) BP: (73-116)/(36-92) 95/51 (05/19 1217) SpO2:  [81 %-100 %] 97 % (05/19 1217) Weight:  [108.4 kg] 108.4 kg (05/19 0432)  Weight change: -2.1 kg Filed Weights   01/15/20 0500 01/15/20 1500 01/17/20 0432  Weight: 110.5 kg 110.5 kg 108.4 kg    Intake/Output: I/O last 3 completed shifts: In: 723 [P.O.:720; I.V.:3] Out: 286 [Urine:50; Other:236]   Intake/Output this shift:  No intake/output data recorded.  Physical Exam: General: NAD, laying in bed  Head: Normocephalic, atraumatic. Moist oral mucosal membranes  Eyes: Anicteric, PERRL  Neck: Supple, trachea midline  Lungs:  Bilateral crackles  Heart: Regular rate and rhythm  Abdomen:  Abdominal wall edema  Extremities:  ++ peripheral edema.  Neurologic: Nonfocal, moving all four extremities  Skin: No lesions  Access: Peritoneal dialysis catheter Right arm AVF - not functioning      Basic Metabolic Panel: Recent Labs  Lab 01/12/20 2119 01/12/20 2119 01/13/20 0009 01/13/20 0028 01/13/20 0028 01/14/20 0559 01/16/20 0401 01/17/20 0630  NA 136  --   --  136  --  140 139 138  K 4.1  --   --  3.7  --  3.7 3.7 3.4*  CL 102  --   --  102  --  103 100 98  CO2 22  --   --  22  --  25 28 28   GLUCOSE 146*  --   --  124*  --  88 114* 109*  BUN 52*  --   --  52*  --  49* 41* 32*  CREATININE 6.60*   < > 6.68* 6.63*  --  6.06* 6.05* 4.85*  CALCIUM 7.4*   < >  --  7.2*   < > 7.2* 7.0* 7.4*   < > = values in this interval not displayed.    Liver Function Tests: Recent Labs  Lab 01/12/20 2119  AST 18  ALT 13   ALKPHOS 122  BILITOT 0.8  PROT 6.2*  ALBUMIN 2.4*   No results for input(s): LIPASE, AMYLASE in the last 168 hours. No results for input(s): AMMONIA in the last 168 hours.  CBC: Recent Labs  Lab 01/12/20 2119 01/13/20 0028 01/14/20 0559 01/16/20 0401 01/17/20 0630  WBC 10.4 8.5 7.0 9.5 10.2  NEUTROABS 9.2*  --   --   --   --   HGB 9.3* 8.6* 8.2* 8.5* 8.2*  HCT 29.9* 27.6* 26.9* 27.9* 26.2*  MCV 96.1 97.2 98.5 99.3 97.8  PLT 288 268 237 245 219    Cardiac Enzymes: No results for input(s): CKTOTAL, CKMB, CKMBINDEX, TROPONINI in the last 168 hours.  BNP: Invalid input(s): POCBNP  CBG: No results for input(s): GLUCAP in the last 168 hours.  Microbiology: Results for orders placed or performed during the hospital encounter of 01/12/20  SARS Coronavirus 2 by RT PCR (hospital order, performed in [facility] hospital lab) Nasopharyngeal Nasopharyngeal Swab     Status: None   Collection Time: 01/12/20  9:08 PM   Specimen: Nasopharyngeal Swab  Result Value Ref  Range Status   SARS Coronavirus 2 NEGATIVE NEGATIVE Final    Comment: (NOTE) SARS-CoV-2 target nucleic acids are NOT DETECTED. The SARS-CoV-2 RNA is generally detectable in upper and lower respiratory specimens during the acute phase of infection. The lowest concentration of SARS-CoV-2 viral copies this assay can detect is 250 copies / mL. A negative result does not preclude SARS-CoV-2 infection and should not be used as the sole basis for treatment or other patient management decisions.  A negative result may occur with improper specimen collection / handling, submission of specimen other than nasopharyngeal swab, presence of viral mutation(s) within the areas targeted by this assay, and inadequate number of viral copies (<250 copies / mL). A negative result must be combined with clinical observations, patient history, and epidemiological information. Fact Sheet for Patients:    StrictlyIdeas.no Fact Sheet for Healthcare Providers: BankingDealers.co.za This test is not yet approved or cleared  by the [place] FDA and has been authorized for detection and/or diagnosis of SARS-CoV-2 by FDA under an Emergency Use Authorization (EUA).  This EUA will remain in effect (meaning this test can be used) for the duration of the COVID-19 declaration under Section 564(b)(1) of the Act, 21 U.S.C. section 360bbb-3(b)(1), unless the authorization is terminated or revoked sooner. Performed at [facility], [address] [place], [place] [address]   Body fluid culture     Status: None (Preliminary result)   Collection Time: 01/13/20 10:45 AM   Specimen: Peritoneal Washings; Body Fluid  Result Value Ref Range Status   Specimen Description   Final    PERITONEAL Performed at [facility], [address] [place], [place] [address]    Special Requests   Final    NONE Performed at [facility], [place], [place] [address]    Gram Stain NO WBC SEEN NO ORGANISMS SEEN   Final   Culture   Final    NO GROWTH 3 DAYS Performed at [facility], [place] [address] [place], [place] [address]    Report Status PENDING  Incomplete  Urine Culture     Status: None   Collection Time: 01/14/20  3:30 AM   Specimen: Urine, Random  Result Value Ref Range Status   Specimen Description   Final    URINE, RANDOM Performed at [facility], [address], [place], [place] [address]    Special Requests   Final    NONE Performed at [facility], [address], [place], [place] [address]    Culture   Final    NO GROWTH Performed at [facility], [place] [address] [place],  [address]    Report Status 01/15/2020 FINAL  Final    Coagulation Studies: No results for input(s): LABPROT, INR in the last 72 hours.  Urinalysis: Recent Labs    01/17/20 0016   COLORURINE YELLOW*  LABSPEC 1.026  PHURINE 5.0  GLUCOSEU NEGATIVE  HGBUR LARGE*  BILIRUBINUR NEGATIVE  KETONESUR NEGATIVE  PROTEINUR 100*  NITRITE NEGATIVE  LEUKOCYTESUR LARGE*      Imaging: CT ANGIO CHEST PE W OR WO CONTRAST  Result Date: 01/16/2020 CLINICAL DATA:  Shortness of breath.  Hypoxia. EXAM: CT ANGIOGRAPHY CHEST WITH CONTRAST TECHNIQUE: Multidetector CT imaging of the chest was performed using the standard protocol during bolus administration of intravenous contrast. Multiplanar CT image reconstructions and MIPs were obtained to evaluate the vascular anatomy. CONTRAST:  155mL OMNIPAQUE IOHEXOL 350 MG/ML SOLN COMPARISON:  None recent  FINDINGS: Cardiovascular: Contrast injection is sufficient to demonstrate satisfactory opacification of the pulmonary arteries to the segmental level. There is no pulmonary embolus. The main pulmonary artery is within normal limits for size. There is no CT evidence of acute right heart strain. There are atherosclerotic changes of the thoracic aorta. The ascending thoracic aorta is aneurysmal measuring at least 4 cm in diameter. Heart size is significantly enlarged. There are advanced coronary artery calcifications. There is a dialysis catheter terminating at cavoatrial junction. There is reflux of contrast into the IVC. Mediastinum/Nodes: --No mediastinal or hilar lymphadenopathy. --No axillary lymphadenopathy. --No supraclavicular lymphadenopathy. --Normal thyroid gland. --The esophagus is unremarkable Lungs/Pleura: There is a large right-sided pleural effusion. Evaluation of the lung fields is limited by respiratory motion artifact. There appears to be some bilateral interlobular septal thickening with ground-glass airspace opacities suggestive of underlying pulmonary edema. There is significant compressive atelectasis involving the right lower lobe. Upper Abdomen: The liver appears to demonstrate a borderline nodular contour. Musculoskeletal: There are  mild degenerative changes throughout the visualized thoracolumbar spine without evidence for an acute displaced fracture. There is subcutaneous edema involving the patient's anterior chest wall on the right, felt to be related to dialysis catheter placement. Review of the MIP images confirms the above findings. IMPRESSION: 1. No acute pulmonary embolism. 2. Cardiomegaly with findings could consistent with congestive heart failure including a large right-sided pleural effusion. 3. Questionable cirrhosis. 4. Ascending thoracic aortic aneurysm measuring approximately 4 cm. Recommend annual imaging followup by CTA or MRA. This recommendation follows 2010 ACCF/AHA/AATS/ACR/ASA/SCA/SCAI/SIR/STS/SVM Guidelines for the Diagnosis and Management of Patients with Thoracic Aortic Disease. Circulation. 2010; 121: I778-E423. Aortic aneurysm NOS (ICD10-I71.9) Aortic Atherosclerosis (ICD10-I70.0). Electronically Signed   By: Constance Holster M.D.   On: 01/16/2020 22:11   PERIPHERAL VASCULAR CATHETERIZATION  Result Date: 01/15/2020 See op note  ECHOCARDIOGRAM COMPLETE  Result Date: 01/16/2020    ECHOCARDIOGRAM REPORT   Patient Name:   AMOUS CREWE Ishmael Date of Exam: 01/16/2020 Medical Rec #:  [id number]        Height:       69.0 in Accession #:    [id number]       Weight:       243.6 lb Date of Birth:  10-15-1960         BSA:          2.247 m Patient Age:    59 years         BP:           97/41 mmHg Patient Gender: M                HR:           78 bpm. Exam Location:  [facility] Procedure: 2D Echo, Color Doppler and Cardiac Doppler Indications:     I50.9 Congestive Heart Failure  History:         Patient has prior history of Echocardiogram examinations, most                  recent 09/18/2018. CHF, CAD, PVD, ESRD stage IV; Risk                  Factors:Diabetes, Hypertension and HCL.  Sonographer:     Charmayne Sheer RDCS (AE) Referring Phys:  [id number] [facility] Diagnosing Phys: Nelva Bush MD IMPRESSIONS  1. Left ventricular  ejection fraction, by estimation, is 25 to 30%. The left ventricle has severely decreased function. The left ventricle demonstrates global hypokinesis. The  left ventricular internal cavity size was moderately dilated. There is mild left ventricular hypertrophy. Left ventricular diastolic parameters are consistent with Grade II diastolic dysfunction (pseudonormalization). Elevated left atrial pressure.  2. Pulmonary artery pressure is at least mildly elevated (PASP 30-35 mmHg plus central venous pressure). Right ventricular systolic function is moderately reduced. The right ventricular size is mildly enlarged.  3. Left atrial size was mildly dilated.  4. Right atrial size was moderately dilated.  5. The mitral valve is normal in structure. Trivial mitral valve regurgitation. No evidence of mitral stenosis.  6. The aortic valve is tricuspid. Aortic valve regurgitation is not visualized. Mild aortic valve sclerosis is present, with no evidence of aortic valve stenosis.  7. Mildly dilated pulmonary artery. Comparison(s): A prior study was performed on 09/18/2018. LVEF has decreased from 35-40% to 25-30%. FINDINGS  Left Ventricle: Left ventricular ejection fraction, by estimation, is 25 to 30%. The left ventricle has severely decreased function. The left ventricle demonstrates global hypokinesis. The left ventricular internal cavity size was moderately dilated. There is mild left ventricular hypertrophy. Left ventricular diastolic parameters are consistent with Grade II diastolic dysfunction (pseudonormalization). Elevated left atrial pressure. Right Ventricle: Pulmonary artery pressure is at least mildly elevated (PASP 30-35 mmHg plus central venous pressure). The right ventricular size is mildly enlarged. No increase in right ventricular wall thickness. Right ventricular systolic function is moderately reduced. Left Atrium: Left atrial size was mildly dilated. Right Atrium: Right atrial size was moderately dilated.  Pericardium: Trivial pericardial effusion is present. The pericardial effusion is posterior to the left ventricle. Mitral Valve: The mitral valve is normal in structure. Trivial mitral valve regurgitation. No evidence of mitral valve stenosis. MV peak gradient, 4.2 mmHg. The mean mitral valve gradient is 2.0 mmHg. Tricuspid Valve: The tricuspid valve is grossly normal. Tricuspid valve regurgitation is mild. Aortic Valve: The aortic valve is tricuspid. . There is moderate thickening and mild calcification of the aortic valve. Aortic valve regurgitation is not visualized. Mild aortic valve sclerosis is present, with no evidence of aortic valve stenosis. There  is moderate thickening of the aortic valve. There is mild calcification of the aortic valve. Aortic valve mean gradient measures 4.0 mmHg. Aortic valve peak gradient measures 8.1 mmHg. Aortic valve area, by VTI measures 1.83 cm. Pulmonic Valve: The pulmonic valve was grossly normal. Pulmonic valve regurgitation is trivial. No evidence of pulmonic stenosis. Aorta: The aortic root is normal in size and structure. Pulmonary Artery: The pulmonary artery is mildly dilated. Venous: The inferior vena cava was not well visualized. IAS/Shunts: The interatrial septum was not well visualized.  LEFT VENTRICLE PLAX 2D LVIDd:         6.00 cm      Diastology LVIDs:         5.39 cm      LV e' lateral:   5.77 cm/s LV PW:         0.94 cm      LV E/e' lateral: 14.9 LV IVS:        1.11 cm      LV e' medial:    3.59 cm/s LVOT diam:     2.10 cm      LV E/e' medial:  23.9 LV SV:         41 LV SV Index:   18 LVOT Area:     3.46 cm  LV Volumes (MOD) LV vol d, MOD A2C: 197.0 ml LV vol d, MOD A4C: 174.0 ml LV vol s, MOD  A2C: 142.0 ml LV vol s, MOD A4C: 131.0 ml LV SV MOD A2C:     55.0 ml LV SV MOD A4C:     174.0 ml LV SV MOD BP:      48.8 ml RIGHT VENTRICLE RV Basal diam:  3.64 cm LEFT ATRIUM             Index       RIGHT ATRIUM           Index LA diam:        4.40 cm 1.96 cm/m  RA  Area:     26.30 cm LA Vol (A2C):   76.6 ml 34.10 ml/m RA Volume:   97.90 ml  43.58 ml/m LA Vol (A4C):   66.1 ml 29.42 ml/m LA Biplane Vol: 74.7 ml 33.25 ml/m  AORTIC VALVE                   PULMONIC VALVE AV Area (Vmax):    1.98 cm    PV Vmax:       0.99 m/s AV Area (Vmean):   1.87 cm    PV Vmean:      57.200 cm/s AV Area (VTI):     1.83 cm    PV VTI:        0.159 m AV Vmax:           142.00 cm/s PV Peak grad:  3.9 mmHg AV Vmean:          88.700 cm/s PV Mean grad:  2.0 mmHg AV VTI:            0.221 m AV Peak Grad:      8.1 mmHg AV Mean Grad:      4.0 mmHg LVOT Vmax:         81.30 cm/s LVOT Vmean:        48.000 cm/s LVOT VTI:          0.117 m LVOT/AV VTI ratio: 0.53  AORTA Ao Root diam: 3.40 cm MITRAL VALVE               TRICUSPID VALVE MV Area (PHT): 5.66 cm    TR Peak grad:   31.8 mmHg MV Peak grad:  4.2 mmHg    TR Vmax:        282.00 cm/s MV Mean grad:  2.0 mmHg MV Vmax:       1.02 m/s    SHUNTS MV Vmean:      67.5 cm/s   Systemic VTI:  0.12 m MV Decel Time: 134 msec    Systemic Diam: 2.10 cm MV E velocity: 85.90 cm/s MV A velocity: 83.40 cm/s MV E/A ratio:  1.03 Harrell Gave End MD Electronically signed by Nelva Bush MD Signature Date/Time: 01/16/2020/2:27:20 PM    Final      Medications:   . sodium chloride Stopped (01/13/20 0231)  . albumin human Stopped (01/16/20 1932)  . dialysis solution 4.25% low-MG/low-CA     . vitamin C  500 mg Oral BID  . aspirin EC  81 mg Oral Daily  . atorvastatin  40 mg Oral Daily  . calcium acetate  2,001 mg Oral TID WC  . Chlorhexidine Gluconate Cloth  6 each Topical Q0600  . collagenase   Topical Daily  . epoetin (EPOGEN/PROCRIT) injection  10,000 Units Intravenous Q M,W,F-HD  . feeding supplement (NEPRO CARB STEADY)  237 mL Oral BID BM  . gentamicin cream  1 application Topical Daily  . heparin  5,000 Units Subcutaneous Q8H  . midodrine  10 mg Oral TID WC  . multivitamin  1 tablet Oral QHS  . pantoprazole  40 mg Oral Daily  . potassium chloride   40 mEq Oral Once  . sodium chloride flush  3 mL Intravenous Q12H   sodium chloride, acetaminophen **OR** acetaminophen, HYDROcodone-acetaminophen, HYDROcodone-acetaminophen, ondansetron **OR** ondansetron (ZOFRAN) IV, ondansetron (ZOFRAN) IV  Assessment/ Plan:  Mr. SULAYMAN MANNING is a 59 y.o. white male with end stage renal disease on peritoneal dialysis, hypertension, peripheral vascular disease, diabetes mellitus type II, asthma , left toe amputation who is admitted to [facility] 01/12/2020 for ESRD (end stage renal disease) on dialysis ([place]) [N18.6, Z99.2] Acute respiratory failure with hypoxia ([place]) [J96.01] Congestive heart failure, unspecified HF chronicity, unspecified heart failure type ([place]) [I50.9]  [place] 99kg Outpatient Peritoneal dialysis CAPD 4 exchanges 2 liter fills  1. End Stage Renal Disease: patient is significantly volume overloaded and seem to have inadequate dialysis.  Seems to have peritoneal membrane failure He was 12 kg above his estimated dry weight of 99kg Seen and examined on hemodialysis treatment.  Will need back up hemodialysis until down to his dry weight.  Outpatient dialysis planning for [employer].  - Attempt peritoneal dialysis today. If no improvement, will transition to hemodialysis.   2. Hypotensive: Holding home agents including furosemide - started midodrine.   3. Anemia with chronic kidney disease: normocytic. Hemoglobin 8.2 - EPO with TTS dialysis treatments.   4. Secondary Hyperparathyroidism: with hyperphosphatemia and hypocalcemia - calcium acetate with meals.   5. Peripheral vascular disease:  - appreciate wound care, podiatry and vascular  6. Anasarca with hypoalbuminemia:  - IV albumin with HD treatments.   Overall prognosis is guarded. Consulted palliative care. Will discuss with patient and his family on the plan going forward.    LOS: 5 Kincade Granberg 5/19/202112:31 PM

## 2020-01-17 NOTE — Consult Note (Signed)
Cardiology Consultation:   Patient ID: Scott [name] MRN: [id number]; DOB: 13-Jun-1961  Admit date: 01/12/2020 Date of Consult: 01/17/2020  Primary Care Provider: [facility] [place] Primary Cardiologist: Kate Sable, MD  Primary Electrophysiologist:  None    Patient Profile:   Scott [name] is a 59 y.o. male with a hx of HFrEF (35-40%  25-30%), pulmonary HTN, ascending aortic aneurysm (4cm, 01/16/20) , CAD and STEMI s/p PCI (LAD x3, OM 2, PDA 2017), PAD, ESRD on peritoneal dialysis with transition to HD this admission, hypertension, diabetes, and who is being seen today for the evaluation of acute on chronic systolic heart failure at the request of Dr. Manuella Ghazi.  History of Present Illness:   Scott [name] is a 59 year old male with PMH as above.  08/2018 echo showed EF 35 to 40%.  2017 stress test was ruled abnormal.  Left heart cardiac catheterization was performed at [facility] with subsequent 3 stents placed to his LAD.   He was last seen in the clinic 06/23/2019, at which time he was seen by Dr. Garen Lah as a referral due to shortness of breath.  He also reported recent left toe amputation for PAD. He was performing peritoneal dialysis by himself daily with AV fistula for HD if needed. He reported feeling more short of breath with exertion, specifically stating that going to the grocery store or simply ambulating caused shortness of breath more easily than in the past.  He reported progression of this daily within the past couple months.  He had not seen a cardiologist in approximately 2 years at that time.  It was thought that his dyspnea on exertion could be secondary to worsening heart failure or anginal equivalent due to his history of CAD s/p PCI. Echo and Lexiscan were subsequently ordered.  Lexiscan was ruled in intermediate risk study.  Echo was not performed but has since been performed this admission as below with EF reduced to 25-30%. Amlodipine was discontinued with  initiation of Imdur 30 mg daily.  He was encouraged to check his blood pressure frequently after stopping amlodipine, and if elevated, tentative plan was to increase hydralazine and/or Imdur.  He was continued on Coreg 25 mg twice daily, hydralazine 25 mg 3 times daily, ASA, Plavix, Lipitor, and Lasix.   On 01/12/2020, he presented to the ED with report of progressive DOE over the last 2 weeks. He was reportedly continuing PD without complications. In the ED, BP 118/56, HR 102bpm, and SpO2 80% ORA. Labs showed Cr 6.60, BUN 52, K 4.1, Na 136, AST 18, ALT 13, BNP 3,262.0, HS Tn 60  63  49, Hgb 9.3, WBC 10.4. CXR showed moderate cardiomegaly and diffuse interstitial edema with moderate R sided pleural effusion. CTA as below was obtained and showed aortic aneurysm of 4cm with recommendation for annual imagining.   Cardiology has since been consulted for most echo with reduced EF as above at 235 - 40%  25-30%, global hypokinesis, mild LVH, G2 DD, PASP 30 to 35 mmHg, RVSF moderately reduced with RV mildly enlarged, mild LAE, moderate RAE, trivial MR, and mildly dilated pulmonary artery.  In addition, diuresis and PD has been complicated by persistent hypotension and possibly tachycardia.  During consultation today, he continues to note DOE and also notes racing heart rate with any type of movement.  No palpitations, dizziness, lightheadedness, recent falls, or LOC.  Denies any chest pain.  Significant edema is noted on exam with patient reporting that he was unaware of when this  began for him. Both R hand edema and bilateral lower extremity edema noted with both feet wrapped in clean and dry bandages. He reports orthopnea but no PND.  He reports difficulty breathing during sleep and is currently seated upright to improve his breathing. No reported s/sx of bleeding; however, on discussion with his current sitter, it is revealed that he has recently struggled with bleeding due to scratching his back and opening up some  sores.  He states that he is being followed closely by nephrology / Dr. Juleen China.    Heart Pathway Score:     Past Medical History:  Diagnosis Date  . Acute respiratory failure with hypoxia ([place]) 09/17/2018  . Arthritis    "left arm; right leg" (12/13/2014)  . Asthma   . CHF (congestive heart failure) ([place])   . Chronic disease anemia    [name] 12/13/2014  . Chronic kidney disease (CKD), stage IV (severe) ([place])    [name] 12/13/2014... on dialysis  . Complication of anesthesia    unable to urinate after CAPD urgery  . Continuous ambulatory peritoneal dialysis status ([place])   . Coronary artery disease    [name] 12/13/2014  . Depression   . Dysrhythmia    patient unaware of irregular heartbeat  . GERD (gastroesophageal reflux disease)   . High cholesterol    [name] 12/13/2014  . Hypertension   . Non-Q wave myocardial infarction ([place])    [name] 12/13/2014  . PVD (peripheral vascular disease) ([place])    [name] 12/13/2014  . Type II diabetes mellitus ([place])     Past Surgical History:  Procedure Laterality Date  . AMPUTATION Right 08/08/2019   Procedure: AMPUTATION RAY GREAT TOE;  Surgeon: Sharlotte Alamo, DPM;  Location: [facility];  Service: Podiatry;  Laterality: Right;  . AMPUTATION TOE Left 05/24/2019   Procedure: Left great toe amputation;  Surgeon: Caroline More, DPM;  Location: [facility];  Service: Podiatry;  Laterality: Left;  . AMPUTATION TOE Left 07/19/2019   Procedure: AMPUTATION 2nd TOE PARTIAL RAY RESECTION;  Surgeon: Sharlotte Alamo, DPM;  Location: [facility];  Service: Podiatry;  Laterality: Left;  . AV FISTULA PLACEMENT Right 04/07/2017   Procedure: ARTERIOVENOUS (AV) FISTULA CREATION ( BRACHIALCEPHALIC );  Surgeon: Katha Cabal, MD;  Location: [facility];  Service: Vascular;  Laterality: Right;  . CAPD INSERTION N/A 04/07/2017   Procedure: LAPAROSCOPIC INSERTION CONTINUOUS AMBULATORY PERITONEAL DIALYSIS  (CAPD) CATHETER;  Surgeon: Katha Cabal, MD;  Location: [facility];  Service:  Vascular;  Laterality: N/A;  . CARDIAC CATHETERIZATION  11/2014  . CENTRAL LINE INSERTION-TUNNELED N/A 08/11/2019   Procedure: CENTRAL LINE INSERTION-TUNNELED ([place]);  Surgeon: Katha Cabal, MD;  Location: [place] [facility];  Service: Cardiovascular;  Laterality: N/A;  . COLONOSCOPY WITH PROPOFOL N/A 12/01/2017   Procedure: COLONOSCOPY WITH PROPOFOL;  Surgeon: Lin Landsman, MD;  Location: [place];  Service: Endoscopy;  Laterality: N/A;  Diabetic - insulin  . COLONOSCOPY WITH PROPOFOL N/A 12/29/2017   Procedure: COLONOSCOPY WITH PROPOFOL;  Surgeon: Lin Landsman, MD;  Location: [place];  Service: Endoscopy;  Laterality: N/A;  . DIALYSIS/PERMA CATHETER INSERTION N/A 01/18/2017   Procedure: Dialysis/Perma Catheter Insertion;  Surgeon: [place] Huxley, MD;  Location: [place] [facility];  Service: Cardiovascular;  Laterality: N/A;  . DIALYSIS/PERMA CATHETER INSERTION Right 01/15/2020   Procedure: DIALYSIS/PERMA CATHETER INSERTION;  Surgeon: [place] Huxley, MD;  Location: [place] [facility];  Service: Cardiovascular;  Laterality: Right;  . DIALYSIS/PERMA CATHETER REMOVAL N/A 07/26/2017  Procedure: DIALYSIS/PERMA CATHETER REMOVAL;  Surgeon: [place] Huxley, MD;  Location: [place] [facility];  Service: Cardiovascular;  Laterality: N/A;  . DIALYSIS/PERMA CATHETER REMOVAL N/A 10/24/2019   Procedure: DIALYSIS/PERMA CATHETER REMOVAL;  Surgeon: Katha Cabal, MD;  Location: [place] [facility];  Service: Cardiovascular;  Laterality: N/A;  . INCISION AND DRAINAGE OF WOUND Right ~ 10/2014   "4th toe foot"  . IRRIGATION AND DEBRIDEMENT FOOT Left 07/19/2019   Procedure: IRRIGATION AND DEBRIDEMENT FOOT;  Surgeon: Sharlotte Alamo, DPM;  Location: [facility];  Service: Podiatry;  Laterality: Left;  . LOWER EXTREMITY ANGIOGRAPHY Left 05/23/2019   Procedure: Lower Extremity Angiography;  Surgeon: Katha Cabal, MD;  Location: [place] [facility];  Service: Cardiovascular;   Laterality: Left;  . LOWER EXTREMITY ANGIOGRAPHY Left 07/18/2019   Procedure: LOWER EXTREMITY ANGIOGRAPHY;  Surgeon: Katha Cabal, MD;  Location: [place] [facility];  Service: Cardiovascular;  Laterality: Left;  . LOWER EXTREMITY ANGIOGRAPHY Right 08/01/2019   Procedure: LOWER EXTREMITY ANGIOGRAPHY;  Surgeon: Katha Cabal, MD;  Location: [place] [facility];  Service: Cardiovascular;  Laterality: Right;  . LOWER EXTREMITY ANGIOGRAPHY Right 08/04/2019   Procedure: Lower Extremity Angiography (Pedal Approach);  Surgeon: Katha Cabal, MD;  Location: [place] [facility];  Service: Cardiovascular;  Laterality: Right;  . PERCUTANEOUS CORONARY STENT INTERVENTION (PCI-S) N/A 12/14/2014   Procedure: PERCUTANEOUS CORONARY STENT INTERVENTION (PCI-S);  Surgeon: Charolette Forward, MD;  Location[facility];  Service: Cardiovascular;  Laterality: N/A;  . PERCUTANEOUS CORONARY STENT INTERVENTION (PCI-S) N/A 12/18/2014   Procedure: PERCUTANEOUS CORONARY STENT INTERVENTION (PCI-S);  Surgeon: Charolette Forward, MD;  Location: [facility] CATH LAB;  Service: Cardiovascular;  Laterality: N/A;  . POLYPECTOMY  12/01/2017   Procedure: POLYPECTOMY;  Surgeon: Lin Landsman, MD;  Location: [place];  Service: Endoscopy;;  . POLYPECTOMY  12/29/2017   Procedure: POLYPECTOMY;  Surgeon: Lin Landsman, MD;  Location: [place];  Service: Endoscopy;;  . TOE AMPUTATION Right 2015   4th toe     Home Medications:  Prior to Admission medications   Medication Sig Start Date End Date Taking? Authorizing Provider  acetaminophen (TYLENOL) 500 MG tablet Take 500-1,000 mg by mouth every 6 (six) hours as needed for mild pain or headache.   Yes [provider]  albuterol (PROVENTIL HFA;VENTOLIN HFA) 108 (90 BASE) MCG/ACT inhaler Inhale 2 puffs into the lungs every 6 (six) hours as needed for wheezing or shortness of breath. 01/10/15  Yes Gladstone Lighter, MD  aspirin EC 81 MG tablet Take 1  tablet (81 mg total) by mouth daily. 07/21/19  Yes Ivor Costa, MD  atorvastatin (LIPITOR) 40 MG tablet Take 40 mg by mouth daily. 05/15/19  Yes [provider]  furosemide (LASIX) 80 MG tablet Take 80 mg by mouth daily. 12/01/18  Yes [provider]  insulin aspart protamine- aspart (NOVOLOG MIX 70/30) (70-30) 100 UNIT/ML injection Inject 0.25 mLs (25 Units total) into the skin 2 (two) times daily with a meal. Patient taking differently: Inject 2 Units into the skin 2 (two) times daily as needed (blood sugar of 140 or higher).  03/12/18  Yes Fritzi Mandes, MD    Inpatient Medications: Scheduled Meds: . aspirin EC  81 mg Oral Daily  . atorvastatin  40 mg Oral Daily  . calcium acetate  2,001 mg Oral TID WC  . Chlorhexidine Gluconate Cloth  6 each Topical Q0600  . collagenase   Topical Daily  . epoetin (EPOGEN/PROCRIT) injection  10,000 Units Intravenous Q M,W,F-HD  . gentamicin cream  1 application Topical Daily  . heparin  5,000 Units Subcutaneous Q8H  . midodrine  10 mg Oral TID WC  . pantoprazole  40 mg Oral Daily  . sodium chloride flush  3 mL Intravenous Q12H   Continuous Infusions: . sodium chloride Stopped (01/13/20 0231)  . albumin human Stopped (01/16/20 1932)  . dialysis solution 4.25% low-MG/low-CA     PRN Meds: sodium chloride, acetaminophen **OR** acetaminophen, HYDROcodone-acetaminophen, HYDROcodone-acetaminophen, ondansetron **OR** ondansetron (ZOFRAN) IV, ondansetron (ZOFRAN) IV  Allergies:   No Known Allergies  Social History:   Social History   Socioeconomic History  . Marital status: Significant Other    Spouse name: Not on file  . Number of children: Not on file  . Years of education: Not on file  . Highest education level: Not on file  Occupational History  . Not on file  Tobacco Use  . Smoking status: Former Smoker    Packs/day: 1.00    Years: 30.00    Pack years: 30.00    Types: Cigarettes    Start date: 08/31/1984    Quit date: 11/30/2014     Years since quitting: 5.1  . Smokeless tobacco: Never Used  Substance and Sexual Activity  . Alcohol use: Not Currently  . Drug use: No  . Sexual activity: Not Currently  Other Topics Concern  . Not on file  Social History Narrative   Lives at home with girlfriend and son   Social Determinants of Health   Financial Resource Strain:   . Difficulty of Paying Living Expenses:   Food Insecurity:   . Worried About Charity fundraiser in the Last Year:   . Arboriculturist in the Last Year:   Transportation Needs:   . Film/video editor (Medical):   [name] Lack of Transportation (Non-Medical):   Physical Activity:   . Days of Exercise per Week:   . Minutes of Exercise per Session:   Stress:   . Feeling of Stress :   Social Connections:   . Frequency of Communication with Friends and Family:   . Frequency of Social Gatherings with Friends and Family:   . Attends Religious Services:   . Active Member of Clubs or Organizations:   . Attends Archivist Meetings:   [name] Marital Status:   Intimate Partner Violence:   . Fear of Current or Ex-Partner:   . Emotionally Abused:   [name] Physically Abused:   . Sexually Abused:     Family History:    Family History  Problem Relation Age of Onset  . Cancer Mother        Lung  . Cancer Father        Lung  . Diabetes Sister   . Diabetes Brother   . CAD Brother   . Hernia Son   . Cancer Brother      ROS:  Please see the history of present illness.  Review of Systems  Constitutional: Positive for malaise/fatigue.  Respiratory: Positive for shortness of breath. Negative for hemoptysis.   Cardiovascular: Positive for orthopnea and leg swelling. Negative for chest pain and palpitations.       Unaware of when leg swelling or R hand swelling began Reports racing HR and DOE  Gastrointestinal: Negative for blood in stool and melena.  Genitourinary: Negative for hematuria.  Musculoskeletal: Negative for falls.  Neurological:  Negative for dizziness, loss of consciousness and weakness.  All other ROS reviewed and negative.     Physical Exam/Data:   Vitals:   01/16/20 2321 01/17/20 0401 01/17/20 0432 01/17/20 0741  BP: (!) 108/92 (!) 89/64  108/86  Pulse: 83 78  79  Resp: 16 16  16   Temp: 98.3 F (36.8 C) 97.7 F (36.5 C)  97.8 F (36.6 C)  TempSrc: Oral Oral    SpO2: 98% 100%  100%  Weight:   108.4 kg   Height:        Intake/Output Summary (Last 24 hours) at 01/17/2020 0933 Last data filed at 01/16/2020 2131 Gross per 24 hour  Intake 483 ml  Output 286 ml  Net 197 ml   Last 3 Weights 01/17/2020 01/15/2020 01/15/2020  Weight (lbs) 238 lb 15.7 oz 243 lb 9.7 oz 243 lb 9.7 oz  Weight (kg) 108.4 kg 110.5 kg 110.5 kg     Body mass index is 35.29 kg/m.  General:  Well nourished, well developed, in no acute distress.  Eating less than.  Appears somewhat frustrated. HEENT: normal. [place] oxygen. Neck: JVD difficult to assess due to position of the patient Vascular:Radial pulses 2+ on left.  Right hand extremely swollen and unable to assess pulse. Cardiac:  normal S1, S2; RRR; no murmur  Lungs: Bibasilar reduced breath sounds worse at right lower lobe and left lower lobe (no breath sounds at RLL), trace crackles appreciated at L lobe. No wheezing, rhonchi or rales  Abd: Firm and distended Ext: 2-3+ bilateral LEE.  Right hand with significant edema and scab on dorsal surface of hand. Musculoskeletal: Bilateral feet wrapped in clean and dry bandages. Skin: warm and dry  Neuro:  No focal abnormalities noted Psych:  Normal affect, somewhat frustrated  EKG:  The EKG was personally reviewed and demonstrates:  Not scanned into EMR Telemetry:  Telemetry was personally reviewed and demonstrates:  NSR 80s  Relevant CV Studies: CTA 01/16/2020 IMPRESSION: 1. No acute pulmonary embolism. 2. Cardiomegaly with findings could consistent with congestive heart failure including a large right-sided pleural effusion. 3.  Questionable cirrhosis. 4. Ascending thoracic aortic aneurysm measuring approximately 4 cm. Recommend annual imaging followup by CTA or MRA. This recommendation follows 2010 ACCF/AHA/AATS/ACR/ASA/SCA/SCAI/SIR/STS/SVM Guidelines for the Diagnosis and Management of Patients with Thoracic Aortic Disease. Circulation. 2010; 121: D532-D924. Aortic aneurysm NOS (ICD10-I71.9)   Echo 01/16/20 1. Left ventricular ejection fraction, by estimation, is 25 to 30%. The  left ventricle has severely decreased function. The left ventricle  demonstrates global hypokinesis. The left ventricular internal cavity size  was moderately dilated. There is mild  left ventricular hypertrophy. Left ventricular diastolic parameters are  consistent with Grade II diastolic dysfunction (pseudonormalization).  Elevated left atrial pressure.  2. Pulmonary artery pressure is at least mildly elevated (PASP 30-35 mmHg  plus central venous pressure). Right ventricular systolic function is  moderately reduced. The right ventricular size is mildly enlarged.  3. Left atrial size was mildly dilated.  4. Right atrial size was moderately dilated.  5. The mitral valve is normal in structure. Trivial mitral valve  regurgitation. No evidence of mitral stenosis.  6. The aortic valve is tricuspid. Aortic valve regurgitation is not  visualized. Mild aortic valve sclerosis is present, with no evidence of  aortic valve stenosis.  7. Mildly dilated pulmonary artery.  Comparison(s): A prior study was performed on 09/18/2018. LVEF has  decreased from 35-40% to 25-30%.   Lexiscan 07/2019  There was no ST segment deviation noted during stress.  There is a medium defect  of severe severity present in the apical anterior, apical septal, apical inferior and apex location.  Findings consistent with prior myocardial infarction.  This is an intermediate risk study.  The left ventricular ejection fraction is severely decreased  (<30%).     Laboratory Data:  High Sensitivity Troponin:   Recent Labs  Lab 01/12/20 2119 01/13/20 0009 01/13/20 1521  TROPONINIHS 60* 63* 49*     Cardiac EnzymesNo results for input(s): TROPONINI in the last 168 hours. No results for input(s): TROPIPOC in the last 168 hours.  Chemistry Recent Labs  Lab 01/14/20 0559 01/16/20 0401 01/17/20 0630  NA 140 139 138  K 3.7 3.7 3.4*  CL 103 100 98  CO2 25 28 28   GLUCOSE 88 114* 109*  BUN 49* 41* 32*  CREATININE 6.06* 6.05* 4.85*  CALCIUM 7.2* 7.0* 7.4*  GFRNONAA 9* 9* 12*  GFRAA 11* 11* 14*  ANIONGAP 12 11 12     Recent Labs  Lab 01/12/20 2119  PROT 6.2*  ALBUMIN 2.4*  AST 18  ALT 13  ALKPHOS 122  BILITOT 0.8   Hematology Recent Labs  Lab 01/14/20 0559 01/16/20 0401 01/17/20 0630  WBC 7.0 9.5 10.2  RBC 2.73* 2.81* 2.68*  HGB 8.2* 8.5* 8.2*  HCT 26.9* 27.9* 26.2*  MCV 98.5 99.3 97.8  MCH 30.0 30.2 30.6  MCHC 30.5 30.5 31.3  RDW 16.9* 16.7* 16.9*  PLT 237 245 219   BNP Recent Labs  Lab 01/12/20 2119  BNP 3,262.0*    DDimer No results for input(s): DDIMER in the last 168 hours.   Radiology/Studies:  CT ANGIO CHEST PE W OR WO CONTRAST  Result Date: 01/16/2020 CLINICAL DATA:  Shortness of breath.  Hypoxia. EXAM: CT ANGIOGRAPHY CHEST WITH CONTRAST TECHNIQUE: Multidetector CT imaging of the chest was performed using the standard protocol during bolus administration of intravenous contrast. Multiplanar CT image reconstructions and MIPs were obtained to evaluate the vascular anatomy. CONTRAST:  181mL OMNIPAQUE IOHEXOL 350 MG/ML SOLN COMPARISON:  None recent FINDINGS: Cardiovascular: Contrast injection is sufficient to demonstrate satisfactory opacification of the pulmonary arteries to the segmental level. There is no pulmonary embolus. The main pulmonary artery is within normal limits for size. There is no CT evidence of acute right heart strain. There are atherosclerotic changes of the thoracic aorta. The  ascending thoracic aorta is aneurysmal measuring at least 4 cm in diameter. Heart size is significantly enlarged. There are advanced coronary artery calcifications. There is a dialysis catheter terminating at cavoatrial junction. There is reflux of contrast into the IVC. Mediastinum/Nodes: --No mediastinal or hilar lymphadenopathy. --No axillary lymphadenopathy. --No supraclavicular lymphadenopathy. --Normal thyroid gland. --The esophagus is unremarkable Lungs/Pleura: There is a large right-sided pleural effusion. Evaluation of the lung fields is limited by respiratory motion artifact. There appears to be some bilateral interlobular septal thickening with ground-glass airspace opacities suggestive of underlying pulmonary edema. There is significant compressive atelectasis involving the right lower lobe. Upper Abdomen: The liver appears to demonstrate a borderline nodular contour. Musculoskeletal: There are mild degenerative changes throughout the visualized thoracolumbar spine without evidence for an acute displaced fracture. There is subcutaneous edema involving the patient's anterior chest wall on the right, felt to be related to dialysis catheter placement. Review of the MIP images confirms the above findings. IMPRESSION: 1. No acute pulmonary embolism. 2. Cardiomegaly with findings could consistent with congestive heart failure including a large right-sided pleural effusion. 3. Questionable cirrhosis. 4. Ascending thoracic aortic aneurysm measuring approximately 4 cm. Recommend annual imaging followup  by CTA or MRA. This recommendation follows 2010 ACCF/AHA/AATS/ACR/ASA/SCA/SCAI/SIR/STS/SVM Guidelines for the Diagnosis and Management of Patients with Thoracic Aortic Disease. Circulation. 2010; 121: Z610-R604. Aortic aneurysm NOS (ICD10-I71.9) Aortic Atherosclerosis (ICD10-I70.0). Electronically Signed   By: Constance Holster M.D.   On: 01/16/2020 22:11   PERIPHERAL VASCULAR CATHETERIZATION  Result Date:  01/15/2020 See op note  ECHOCARDIOGRAM COMPLETE  Result Date: 01/16/2020    ECHOCARDIOGRAM REPORT   Patient Name:   Scott [name] [place] Date of Exam: 01/16/2020 Medical Rec #:  [id number]        Height:       69.0 in Accession #:    [id number]       Weight:       243.6 lb Date of Birth:  1960-11-27         BSA:          2.247 m Patient Age:    20 years         BP:           97/41 mmHg Patient Gender: M                HR:           78 bpm. Exam Location:  [facility] Procedure: 2D Echo, Color Doppler and Cardiac Doppler Indications:     I50.9 Congestive Heart Failure  History:         Patient has prior history of Echocardiogram examinations, most                  recent 09/18/2018. CHF, CAD, PVD, ESRD stage IV; Risk                  Factors:Diabetes, Hypertension and HCL.  Sonographer:     Charmayne Sheer RDCS (AE) Referring Phys:  [id number] [facility] Diagnosing Phys: Nelva Bush MD IMPRESSIONS  1. Left ventricular ejection fraction, by estimation, is 25 to 30%. The left ventricle has severely decreased function. The left ventricle demonstrates global hypokinesis. The left ventricular internal cavity size was moderately dilated. There is mild left ventricular hypertrophy. Left ventricular diastolic parameters are consistent with Grade II diastolic dysfunction (pseudonormalization). Elevated left atrial pressure.  2. Pulmonary artery pressure is at least mildly elevated (PASP 30-35 mmHg plus central venous pressure). Right ventricular systolic function is moderately reduced. The right ventricular size is mildly enlarged.  3. Left atrial size was mildly dilated.  4. Right atrial size was moderately dilated.  5. The mitral valve is normal in structure. Trivial mitral valve regurgitation. No evidence of mitral stenosis.  6. The aortic valve is tricuspid. Aortic valve regurgitation is not visualized. Mild aortic valve sclerosis is present, with no evidence of aortic valve stenosis.  7. Mildly dilated pulmonary artery.  Comparison(s): A prior study was performed on 09/18/2018. LVEF has decreased from 35-40% to 25-30%. FINDINGS  Left Ventricle: Left ventricular ejection fraction, by estimation, is 25 to 30%. The left ventricle has severely decreased function. The left ventricle demonstrates global hypokinesis. The left ventricular internal cavity size was moderately dilated. There is mild left ventricular hypertrophy. Left ventricular diastolic parameters are consistent with Grade II diastolic dysfunction (pseudonormalization). Elevated left atrial pressure. Right Ventricle: Pulmonary artery pressure is at least mildly elevated (PASP 30-35 mmHg plus central venous pressure). The right ventricular size is mildly enlarged. No increase in right ventricular wall thickness. Right ventricular systolic function is moderately reduced. Left Atrium: Left atrial size was mildly dilated. Right Atrium: Right atrial size was moderately dilated. Pericardium:  Trivial pericardial effusion is present. The pericardial effusion is posterior to the left ventricle. Mitral Valve: The mitral valve is normal in structure. Trivial mitral valve regurgitation. No evidence of mitral valve stenosis. MV peak gradient, 4.2 mmHg. The mean mitral valve gradient is 2.0 mmHg. Tricuspid Valve: The tricuspid valve is grossly normal. Tricuspid valve regurgitation is mild. Aortic Valve: The aortic valve is tricuspid. . There is moderate thickening and mild calcification of the aortic valve. Aortic valve regurgitation is not visualized. Mild aortic valve sclerosis is present, with no evidence of aortic valve stenosis. There  is moderate thickening of the aortic valve. There is mild calcification of the aortic valve. Aortic valve mean gradient measures 4.0 mmHg. Aortic valve peak gradient measures 8.1 mmHg. Aortic valve area, by VTI measures 1.83 cm. Pulmonic Valve: The pulmonic valve was grossly normal. Pulmonic valve regurgitation is trivial. No evidence of pulmonic  stenosis. Aorta: The aortic root is normal in size and structure. Pulmonary Artery: The pulmonary artery is mildly dilated. Venous: The inferior vena cava was not well visualized. IAS/Shunts: The interatrial septum was not well visualized.  LEFT VENTRICLE PLAX 2D LVIDd:         6.00 cm      Diastology LVIDs:         5.39 cm      LV e' lateral:   5.77 cm/s LV PW:         0.94 cm      LV E/e' lateral: 14.9 LV IVS:        1.11 cm      LV e' medial:    3.59 cm/s LVOT diam:     2.10 cm      LV E/e' medial:  23.9 LV SV:         41 LV SV Index:   18 LVOT Area:     3.46 cm  LV Volumes (MOD) LV vol d, MOD A2C: 197.0 ml LV vol d, MOD A4C: 174.0 ml LV vol s, MOD A2C: 142.0 ml LV vol s, MOD A4C: 131.0 ml LV SV MOD A2C:     55.0 ml LV SV MOD A4C:     174.0 ml LV SV MOD BP:      48.8 ml RIGHT VENTRICLE RV Basal diam:  3.64 cm LEFT ATRIUM             Index       RIGHT ATRIUM           Index LA diam:        4.40 cm 1.96 cm/m  RA Area:     26.30 cm LA Vol (A2C):   76.6 ml 34.10 ml/m RA Volume:   97.90 ml  43.58 ml/m LA Vol (A4C):   66.1 ml 29.42 ml/m LA Biplane Vol: 74.7 ml 33.25 ml/m  AORTIC VALVE                   PULMONIC VALVE AV Area (Vmax):    1.98 cm    PV Vmax:       0.99 m/s AV Area (Vmean):   1.87 cm    PV Vmean:      57.200 cm/s AV Area (VTI):     1.83 cm    PV VTI:        0.159 m AV Vmax:           142.00 cm/s PV Peak grad:  3.9 mmHg AV Vmean:  88.700 cm/s PV Mean grad:  2.0 mmHg AV VTI:            0.221 m AV Peak Grad:      8.1 mmHg AV Mean Grad:      4.0 mmHg LVOT Vmax:         81.30 cm/s LVOT Vmean:        48.000 cm/s LVOT VTI:          0.117 m LVOT/AV VTI ratio: 0.53  AORTA Ao Root diam: 3.40 cm MITRAL VALVE               TRICUSPID VALVE MV Area (PHT): 5.66 cm    TR Peak grad:   31.8 mmHg MV Peak grad:  4.2 mmHg    TR Vmax:        282.00 cm/s MV Mean grad:  2.0 mmHg MV Vmax:       1.02 m/s    SHUNTS MV Vmean:      67.5 cm/s   Systemic VTI:  0.12 m MV Decel Time: 134 msec    Systemic Diam: 2.10 cm  MV E velocity: 85.90 cm/s MV A velocity: 83.40 cm/s MV E/A ratio:  1.03 Harrell Gave End MD Electronically signed by Nelva Bush MD Signature Date/Time: 01/16/2020/2:27:20 PM    Final     Assessment and Plan:   Acute on chronic systolic heart failure Pulmonary Hypertension --Reports significant DOE over the past 2 weeks with DOE reported at his last visit.   --Most recent echo shows reduced ejection fraction reduced from previous 35-40%  25 to 30%.  Global hypokinesis, mild LVH, G2DD, PASP 30 to 35 mmHg, RVSF moderately reduced with RV mildly enlarged, mild LAE, moderate RAE, trivial MR, and mildly dilated pulmonary artery. --BNP significantly elevated as above.  --Significantly volume overloaded on exam.  Volume management per nephrology as below.  --Continue to monitor I/Os, daily weights.  --Given his reduced EF, he will need further ischemic work-up and will likely benefit from [facility] as soon as he is able to tolerate the procedure from a volume/respiratory standpoint.  --Escalation of GDMT currently limited by BP and should be escalated as tolerated and if nephrology agreeable. He was started on midodrine yesterday per nephrology.   CAD s/p PCI x3 to LAD, OM2, and PDA (2017)  --No CP. Most recent echo with reduced EF as above. Previous Lexi ruled intermediate risk as above.  --HS Tn minimally elevated at presentation. Recommend repeat EKG or scan EKG into EMR as soon as possible.  --Given his reduced EF, he will need further ischemic work-up and will likely benefit from [facility] as soon as he is able to tolerate the procedure from a volume/respiratory standpoint.  --Continue medical management as BP allows. As above, GDMT limited by BP and should be escalated as tolerated.  Hypotension --Continue holding antihypertensives for now given soft pressures and as per nephrology recommendations. He was started on midodrine yesterday.   Ascending aortic aneurysm (4cm, 12/2019) --Recent imaging as  above with 4cm aneurysm and recommendation for annual imaging. Avoid FQ and heavy lifting. We will plan to follow this closely.   Hypokalemia --Replete with goal 4.0. Check Mg with goal 2.0. Daily BMET.  Hypoalbuminemia --Per IM, nephrology.   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 He was seen and examined on HD treatment yesterday. Recommendation was for HD until back down to his dry weight, as he was 12kg above this at presentation. RIJ permcath placed by  [name] yesterday. Defer to nephrology for management of volume at this time. Continue to monitor renal function and electrolytes.    Anemia --Continue to monitor with daily CBC. Denies s/sx of bleeding. Consider anemia of chronic dz, as well as recent UA results with large Hgb by urine dipstick. Consider transfusion below 8.0. Per IM.   R hand swelling --Unclear if infection versus infiltration. Per IM. Monitor.   PAD --Bilateral lower extremities wrapped with clean and dry bandages. S/p L toe amputation.  DM2 --Glycemic control recommended. Per IM.   For questions or updates, please contact [place] Please consult [email or web address] for contact info under     Signed, Arvil Chaco, PA-C  01/17/2020 9:33 AM

## 2020-01-17 NOTE — Progress Notes (Addendum)
Progress Note    Scott [name]  HUT:[id number] DOB: [birth year]  DOA: 01/12/2020 PCP: [facility]      Brief Narrative:    Medical records reviewed and are as summarized below:  Scott [name] is an 59 y.o. male with medical history significant forESRD on peritoneal dialysis, chronic systolic CHF (EF 65-68% by TTE 09/18/2018), CAD, insulin-dependent type 2 diabetes, anemia of chronic disease, hypertension, hyperlipidemia, peripheral vascular disease whois admitted with acute respiratory failure with hypoxia due to acute on chronic systolic CHF in setting of ESRD on PD.       Assessment/Plan:   Principal Problem:   Acute respiratory failure with hypoxia (HCC) Active Problems:   Type 2 diabetes mellitus with other diabetic kidney complication (HCC)   Acute on chronic systolic CHF (congestive heart failure) (HCC)   Anemia in ESRD (end-stage renal disease) (HCC)   PVD (peripheral vascular disease) ([place])   Hypertension associated with diabetes ([place])   Coronary artery disease   ESRD on peritoneal dialysis ([place])   Hyperlipidemia associated with type 2 diabetes mellitus ([place])   Pressure injury of skin   Acute respiratory failure with hypoxia due to acute on chronic systolic CHF in setting of ESRD on peritoneal dialysis: Requiring 4 L oxygen via nasal cannula - CT Angio to r/o PE - ordered 5/18 2D echo showed EF estimated at 25 to 30%.  Appreciate assistance from cardiologist.   End Stage Renal Disease:  No dialysis.  Status post temporary dialysis catheter placement by vascular surgery on 5/15 and 1 session of hemo dialysis -s/p tunneled dialysis catheter on 5/17.  Informed nursing staff to remove temporary femoral catheter  Follow-up with nephrologist for hemodialysis.  Persistent hypotension Continue midodrine. Echo shows severe global hypokinesis. EF 25-30% Tachycardia has improved.  CAD/hyperlipidemia Chest pain.  Mildly elevated troponin  likely from demand ischemia.   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  Vascular surgeon said amputation is the only next option.  Continue local wound care. Continue aspirin and statin   Anemia of chronic kidney disease: stable withoutobvious bleeding.  EPO per nephrology at dialysis  Insulin-dependent type 2 diabetes: - sensitive SSI while in hospital and adjust as needed.  Nonhealing vascular wounds to bilateral feet. 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 Remaining toes are necrotic and nonintact on plantar surface - Present on admission - appreciate WOC nurse eval and input - Wound type:Vascualr wounds and pressure/moisture to buttocks. - Measurement:sacrum: 3 cm x 2.4 cm slough to wound bed - dressing changes per [place] team  Weakness Physical therapist has been consulted.  Depression with suicidal ideation Ordered 1:1 sitter. Suicide precautions. Consulted psychiatrist for further evaluation. Notified Dr. Janese Banks. Jun, RN, has been notified as well   Right arm swelling Obtain venous duplex for further evaluation.    Body mass index is 35.29 kg/m.  (Morbid obesity)   Pressure Injury 01/12/20 Coccyx Stage 2 -  Partial thickness loss of dermis presenting as a shallow open injury with a red, pink wound bed without slough. stage 2 pressure ulcer  (Active)  01/12/20 2330  Location: Coccyx  Location Orientation:   Staging: Stage 2 -  Partial thickness loss of dermis presenting as a shallow open injury with a red, pink wound bed without slough.  Wound Description (Comments): stage 2 pressure ulcer   Present on Admission: Yes     Pressure Injury 01/12/20 Heel (Active)  01/12/20 2330  Location: Heel  Location Orientation:   Staging:   Wound Description (Comments):   Present on Admission:          Family Communication/Anticipated D/C date and plan/Code Status   DVT prophylaxis: Heparin Code Status:  Full code Family Communication: None Disposition Plan:    Status is: Inpatient  Remains inpatient appropriate because:Inpatient level of care appropriate due to severity of illness   Dispo: The patient is from: Home              Anticipated d/c is to: Home              Anticipated d/c date is: 3 days              Patient currently is not medically stable to d/c.            Subjective:   "not worth a dime. I want to shoot myself". He said he lives with his son and girlfriend and they know he wants to kill himself. He said he has many guns at home  Objective:    Vitals:   01/16/20 2321 01/17/20 0401 01/17/20 0432 01/17/20 0741  BP: (!) 108/92 (!) 89/64  108/86  Pulse: 83 78  79  Resp: 16 16  16   Temp: 98.3 F (36.8 C) 97.7 F (36.5 C)  97.8 F (36.6 C)  TempSrc: Oral Oral    SpO2: 98% 100%  100%  Weight:   108.4 kg   Height:       No data found.   Intake/Output Summary (Last 24 hours) at 01/17/2020 0947 Last data filed at 01/16/2020 2131 Gross per 24 hour  Intake 483 ml  Output 286 ml  Net 197 ml   Filed Weights   01/15/20 0500 01/15/20 1500 01/17/20 0432  Weight: 110.5 kg 110.5 kg 108.4 kg    Exam:  GEN: NAD SKIN: Multiple excoriations on the back, bruises on chest.  Stage II coccygeal decubitus ulcer and unstageable right heel decubitus ulcer (present on admission) EYES: EOMI ENT: MMM CV: RRR PULM: CTA B ABD: soft, ND, NT, +BS CNS: AAO x 3, non focal EXT: significant b/l lower extremity edema from thighs to feet, no tenderness.  Extremity swelling.  Dry gangrene of bilateral feet (right foot and fifth and left third toes) PSYCH: Depressed   Data Reviewed:   I have personally reviewed following labs and imaging studies:  Labs: Labs show the following:   Basic Metabolic Panel: Recent Labs  Lab 01/12/20 2119 01/12/20 2119 01/13/20 0009 01/13/20 0028 01/13/20 0028 01/14/20 0559 01/14/20 0559 01/16/20 0401 01/17/20 0630  NA 136   --   --  136  --  140  --  139 138  K 4.1   < >  --  3.7   < > 3.7   < > 3.7 3.4*  CL 102  --   --  102  --  103  --  100 98  CO2 22  --   --  22  --  25  --  28 28  GLUCOSE 146*  --   --  124*  --  88  --  114* 109*  BUN 52*  --   --  52*  --  49*  --  41* 32*  CREATININE 6.60*   < > 6.68* 6.63*  --  6.06*  --  6.05* 4.85*  CALCIUM 7.4*  --   --  7.2*  --  7.2*  --  7.0*  7.4*   < > = values in this interval not displayed.   GFR Estimated Creatinine Clearance: 19.4 mL/min (A) (by C-G formula based on SCr of 4.85 mg/dL (H)). Liver Function Tests: Recent Labs  Lab 01/12/20 2119  AST 18  ALT 13  ALKPHOS 122  BILITOT 0.8  PROT 6.2*  ALBUMIN 2.4*   No results for input(s): LIPASE, AMYLASE in the last 168 hours. No results for input(s): AMMONIA in the last 168 hours. Coagulation profile No results for input(s): INR, PROTIME in the last 168 hours.  CBC: Recent Labs  Lab 01/12/20 2119 01/13/20 0028 01/14/20 0559 01/16/20 0401 01/17/20 0630  WBC 10.4 8.5 7.0 9.5 10.2  NEUTROABS 9.2*  --   --   --   --   HGB 9.3* 8.6* 8.2* 8.5* 8.2*  HCT 29.9* 27.6* 26.9* 27.9* 26.2*  MCV 96.1 97.2 98.5 99.3 97.8  PLT 288 268 237 245 219   Cardiac Enzymes: No results for input(s): CKTOTAL, CKMB, CKMBINDEX, TROPONINI in the last 168 hours. BNP (last 3 results) No results for input(s): PROBNP in the last 8760 hours. CBG: No results for input(s): GLUCAP in the last 168 hours. D-Dimer: No results for input(s): DDIMER in the last 72 hours. Hgb A1c: No results for input(s): HGBA1C in the last 72 hours. Lipid Profile: No results for input(s): CHOL, HDL, LDLCALC, TRIG, CHOLHDL, LDLDIRECT in the last 72 hours. Thyroid function studies: No results for input(s): TSH, T4TOTAL, T3FREE, THYROIDAB in the last 72 hours.  Invalid input(s): FREET3 Anemia work up: No results for input(s): VITAMINB12, FOLATE, FERRITIN, TIBC, IRON, RETICCTPCT in the last 72 hours. Sepsis Labs: Recent Labs  Lab  01/13/20 0028 01/14/20 0559 01/16/20 0401 01/17/20 0630  WBC 8.5 7.0 9.5 10.2    Microbiology Recent Results (from the past 240 hour(s))  SARS Coronavirus 2 by RT PCR (hospital order, performed in [facility] hospital lab) Nasopharyngeal Nasopharyngeal Swab     Status: None   Collection Time: 01/12/20  9:08 PM   Specimen: Nasopharyngeal Swab  Result Value Ref Range Status   SARS Coronavirus 2 NEGATIVE NEGATIVE Final    Comment: (NOTE) SARS-CoV-2 target nucleic acids are NOT DETECTED. The SARS-CoV-2 RNA is generally detectable in upper and lower respiratory specimens during the acute phase of infection. The lowest concentration of SARS-CoV-2 viral copies this assay can detect is 250 copies / mL. A negative result does not preclude SARS-CoV-2 infection and should not be used as the sole basis for treatment or other patient management decisions.  A negative result may occur with improper specimen collection / handling, submission of specimen other than nasopharyngeal swab, presence of viral mutation(s) within the areas targeted by this assay, and inadequate number of viral copies (<250 copies / mL). A negative result must be combined with clinical observations, patient history, and epidemiological information. Fact Sheet for Patients:   StrictlyIdeas.no Fact Sheet for Healthcare Providers: BankingDealers.co.za This test is not yet approved or cleared  by the [place] FDA and has been authorized for detection and/or diagnosis of SARS-CoV-2 by FDA under an Emergency Use Authorization (EUA).  This EUA will remain in effect (meaning this test can be used) for the duration of the COVID-19 declaration under Section 564(b)(1) of the Act, 21 U.S.C. section 360bbb-3(b)(1), unless the authorization is terminated or revoked sooner. Performed at [facility], [address] [place], [place] [address]   Body fluid culture      Status: None (Preliminary result)   Collection Time:  01/13/20 10:45 AM   Specimen: Peritoneal Washings; Body Fluid  Result Value Ref Range Status   Specimen Description   Final    PERITONEAL Performed at [facility], [address], [place], [place] [address]    Special Requests   Final    NONE Performed at [facility], [place], [place] [address]    Gram Stain NO WBC SEEN NO ORGANISMS SEEN   Final   Culture   Final    NO GROWTH 3 DAYS Performed at [facility], [place] [address], [place], [place] [address]    Report Status PENDING  Incomplete  Urine Culture     Status: None   Collection Time: 01/14/20  3:30 AM   Specimen: Urine, Random  Result Value Ref Range Status   Specimen Description   Final    URINE, RANDOM Performed at [facility], [address] [place], [place] [address]    Special Requests   Final    NONE Performed at [facility], [address], [place], [place] [address]    Culture   Final    NO GROWTH Performed at [facility], [place] [address] [place], [place] [address]    Report Status 01/15/2020 FINAL  Final    Procedures and diagnostic studies:  CT ANGIO CHEST PE W OR WO CONTRAST  Result Date: 01/16/2020 CLINICAL DATA:  Shortness of breath.  Hypoxia. EXAM: CT ANGIOGRAPHY CHEST WITH CONTRAST TECHNIQUE: Multidetector CT imaging of the chest was performed using the standard protocol during bolus administration of intravenous contrast. Multiplanar CT image reconstructions and MIPs were obtained to evaluate the vascular anatomy. CONTRAST:  160mL OMNIPAQUE IOHEXOL 350 MG/ML SOLN COMPARISON:  None recent FINDINGS: Cardiovascular: Contrast injection is sufficient to demonstrate satisfactory opacification of the pulmonary arteries to the segmental level. There is no pulmonary embolus. The main pulmonary artery is within normal limits for size. There is no CT evidence of acute right heart strain.  There are atherosclerotic changes of the thoracic aorta. The ascending thoracic aorta is aneurysmal measuring at least 4 cm in diameter. Heart size is significantly enlarged. There are advanced coronary artery calcifications. There is a dialysis catheter terminating at cavoatrial junction. There is reflux of contrast into the IVC. Mediastinum/Nodes: --No mediastinal or hilar lymphadenopathy. --No axillary lymphadenopathy. --No supraclavicular lymphadenopathy. --Normal thyroid gland. --The esophagus is unremarkable Lungs/Pleura: There is a large right-sided pleural effusion. Evaluation of the lung fields is limited by respiratory motion artifact. There appears to be some bilateral interlobular septal thickening with ground-glass airspace opacities suggestive of underlying pulmonary edema. There is significant compressive atelectasis involving the right lower lobe. Upper Abdomen: The liver appears to demonstrate a borderline nodular contour. Musculoskeletal: There are mild degenerative changes throughout the visualized thoracolumbar spine without evidence for an acute displaced fracture. There is subcutaneous edema involving the patient's anterior chest wall on the right, felt to be related to dialysis catheter placement. Review of the MIP images confirms the above findings. IMPRESSION: 1. No acute pulmonary embolism. 2. Cardiomegaly with findings could consistent with congestive heart failure including a large right-sided pleural effusion. 3. Questionable cirrhosis. 4. Ascending thoracic aortic aneurysm measuring approximately 4 cm. Recommend annual imaging followup by CTA or MRA. This recommendation follows 2010 ACCF/AHA/AATS/ACR/ASA/SCA/SCAI/SIR/STS/SVM Guidelines for the Diagnosis and Management of Patients with Thoracic Aortic Disease. Circulation. 2010; 121: B151-V616. Aortic aneurysm NOS (ICD10-I71.9) Aortic Atherosclerosis (ICD10-I70.0). Electronically Signed   By: Constance Holster M.D.   On: 01/16/2020  22:11   PERIPHERAL VASCULAR CATHETERIZATION  Result Date: 01/15/2020 See op note  ECHOCARDIOGRAM COMPLETE  Result Date: 01/16/2020    ECHOCARDIOGRAM REPORT   Patient Name:   Scott [name] [place] Date of Exam: 01/16/2020 Medical Rec #:  [id number]        Height:       69.0 in Accession #:    [id number]       Weight:       243.6 lb Date of Birth:  October 13, 1960         BSA:          2.247 m Patient Age:    49 years         BP:           97/41 mmHg Patient Gender: M                HR:           78 bpm. Exam Location:  [facility] Procedure: 2D Echo, Color Doppler and Cardiac Doppler Indications:     I50.9 Congestive Heart Failure  History:         Patient has prior history of Echocardiogram examinations, most                  recent 09/18/2018. CHF, CAD, PVD, ESRD stage IV; Risk                  Factors:Diabetes, Hypertension and HCL.  Sonographer:     Charmayne Sheer RDCS (AE) Referring Phys:  [id number] [facility] Diagnosing Phys: Nelva Bush MD IMPRESSIONS  1. Left ventricular ejection fraction, by estimation, is 25 to 30%. The left ventricle has severely decreased function. The left ventricle demonstrates global hypokinesis. The left ventricular internal cavity size was moderately dilated. There is mild left ventricular hypertrophy. Left ventricular diastolic parameters are consistent with Grade II diastolic dysfunction (pseudonormalization). Elevated left atrial pressure.  2. Pulmonary artery pressure is at least mildly elevated (PASP 30-35 mmHg plus central venous pressure). Right ventricular systolic function is moderately reduced. The right ventricular size is mildly enlarged.  3. Left atrial size was mildly dilated.  4. Right atrial size was moderately dilated.  5. The mitral valve is normal in structure. Trivial mitral valve regurgitation. No evidence of mitral stenosis.  6. The aortic valve is tricuspid. Aortic valve regurgitation is not visualized. Mild aortic valve sclerosis is present, with no evidence of aortic  valve stenosis.  7. Mildly dilated pulmonary artery. Comparison(s): A prior study was performed on 09/18/2018. LVEF has decreased from 35-40% to 25-30%. FINDINGS  Left Ventricle: Left ventricular ejection fraction, by estimation, is 25 to 30%. The left ventricle has severely decreased function. The left ventricle demonstrates global hypokinesis. The left ventricular internal cavity size was moderately dilated. There is mild left ventricular hypertrophy. Left ventricular diastolic parameters are consistent with Grade II diastolic dysfunction (pseudonormalization). Elevated left atrial pressure. Right Ventricle: Pulmonary artery pressure is at least mildly elevated (PASP 30-35 mmHg plus central venous pressure). The right ventricular size is mildly enlarged. No increase in right ventricular wall thickness. Right ventricular systolic function is moderately reduced. Left Atrium: Left atrial size was mildly dilated. Right Atrium: Right atrial size was moderately dilated. Pericardium: Trivial pericardial effusion is present. The pericardial effusion is posterior to the left ventricle. Mitral Valve: The mitral valve is normal in structure. Trivial mitral valve regurgitation. No evidence of mitral valve stenosis. MV peak gradient, 4.2 mmHg. The mean mitral valve gradient is 2.0 mmHg.  Tricuspid Valve: The tricuspid valve is grossly normal. Tricuspid valve regurgitation is mild. Aortic Valve: The aortic valve is tricuspid. . There is moderate thickening and mild calcification of the aortic valve. Aortic valve regurgitation is not visualized. Mild aortic valve sclerosis is present, with no evidence of aortic valve stenosis. There  is moderate thickening of the aortic valve. There is mild calcification of the aortic valve. Aortic valve mean gradient measures 4.0 mmHg. Aortic valve peak gradient measures 8.1 mmHg. Aortic valve area, by VTI measures 1.83 cm. Pulmonic Valve: The pulmonic valve was grossly normal. Pulmonic valve  regurgitation is trivial. No evidence of pulmonic stenosis. Aorta: The aortic root is normal in size and structure. Pulmonary Artery: The pulmonary artery is mildly dilated. Venous: The inferior vena cava was not well visualized. IAS/Shunts: The interatrial septum was not well visualized.  LEFT VENTRICLE PLAX 2D LVIDd:         6.00 cm      Diastology LVIDs:         5.39 cm      LV e' lateral:   5.77 cm/s LV PW:         0.94 cm      LV E/e' lateral: 14.9 LV IVS:        1.11 cm      LV e' medial:    3.59 cm/s LVOT diam:     2.10 cm      LV E/e' medial:  23.9 LV SV:         41 LV SV Index:   18 LVOT Area:     3.46 cm  LV Volumes (MOD) LV vol d, MOD A2C: 197.0 ml LV vol d, MOD A4C: 174.0 ml LV vol s, MOD A2C: 142.0 ml LV vol s, MOD A4C: 131.0 ml LV SV MOD A2C:     55.0 ml LV SV MOD A4C:     174.0 ml LV SV MOD BP:      48.8 ml RIGHT VENTRICLE RV Basal diam:  3.64 cm LEFT ATRIUM             Index       RIGHT ATRIUM           Index LA diam:        4.40 cm 1.96 cm/m  RA Area:     26.30 cm LA Vol (A2C):   76.6 ml 34.10 ml/m RA Volume:   97.90 ml  43.58 ml/m LA Vol (A4C):   66.1 ml 29.42 ml/m LA Biplane Vol: 74.7 ml 33.25 ml/m  AORTIC VALVE                   PULMONIC VALVE AV Area (Vmax):    1.98 cm    PV Vmax:       0.99 m/s AV Area (Vmean):   1.87 cm    PV Vmean:      57.200 cm/s AV Area (VTI):     1.83 cm    PV VTI:        0.159 m AV Vmax:           142.00 cm/s PV Peak grad:  3.9 mmHg AV Vmean:          88.700 cm/s PV Mean grad:  2.0 mmHg AV VTI:            0.221 m AV Peak Grad:      8.1 mmHg AV Mean Grad:      4.0 mmHg LVOT Vmax:  81.30 cm/s LVOT Vmean:        48.000 cm/s LVOT VTI:          0.117 m LVOT/AV VTI ratio: 0.53  AORTA Ao Root diam: 3.40 cm MITRAL VALVE               TRICUSPID VALVE MV Area (PHT): 5.66 cm    TR Peak grad:   31.8 mmHg MV Peak grad:  4.2 mmHg    TR Vmax:        282.00 cm/s MV Mean grad:  2.0 mmHg MV Vmax:       1.02 m/s    SHUNTS MV Vmean:      67.5 cm/s   Systemic VTI:  0.12 m  MV Decel Time: 134 msec    Systemic Diam: 2.10 cm MV E velocity: 85.90 cm/s MV A velocity: 83.40 cm/s MV E/A ratio:  1.03 Harrell Gave End MD Electronically signed by Nelva Bush MD Signature Date/Time: 01/16/2020/2:27:20 PM    Final     Medications:   . aspirin EC  81 mg Oral Daily  . atorvastatin  40 mg Oral Daily  . calcium acetate  2,001 mg Oral TID WC  . Chlorhexidine Gluconate Cloth  6 each Topical Q0600  . collagenase   Topical Daily  . epoetin (EPOGEN/PROCRIT) injection  10,000 Units Intravenous Q M,W,F-HD  . gentamicin cream  1 application Topical Daily  . heparin  5,000 Units Subcutaneous Q8H  . midodrine  10 mg Oral TID WC  . pantoprazole  40 mg Oral Daily  . sodium chloride flush  3 mL Intravenous Q12H   Continuous Infusions: . sodium chloride Stopped (01/13/20 0231)  . albumin human Stopped (01/16/20 1932)  . dialysis solution 4.25% low-MG/low-CA       LOS: 5 days   Scott [name]  [facility]     01/17/2020, 9:47 AM

## 2020-01-17 NOTE — Progress Notes (Signed)
PT Cancellation Note  Patient Details Name: Scott [name] MRN: [id number] DOB: September 27, 1960   Cancelled Treatment:    Reason Eval/Treat Not Completed: Other (comment). COnsult received and chart reviewed. Pt currently on PD at this time, unable to work with therapy. Will re-attempt another date.   Sharnika Binney 01/17/2020, 2:05 PM Greggory Stallion, PT, DPT [phone]

## 2020-01-17 NOTE — Progress Notes (Signed)
Pt tolerated PD initiation well no issues, pt stable

## 2020-01-17 NOTE — Progress Notes (Signed)
Pt stable for PD tx

## 2020-01-17 NOTE — Progress Notes (Signed)
Patient's chair time at [facility] is TTS 12:45pm. Patient is aware.

## 2020-01-17 NOTE — Progress Notes (Addendum)
Initial Nutrition Assessment  DOCUMENTATION CODES:   Obesity unspecified  INTERVENTION:   Nepro Shake po BID, each supplement provides 425 kcal and 19 grams protein  Rena-vite daily   Vitamin C 556m po BID  NUTRITION DIAGNOSIS:   Increased nutrient needs related to chronic illness(ESRD on dialysis, wound healing) as evidenced by increased estimated needs.  GOAL:   Patient will meet greater than or equal to 90% of their needs  MONITOR:   PO intake, Supplement acceptance, Labs, Weight trends, Skin, I & O's  REASON FOR ASSESSMENT:   Consult Assessment of nutrition requirement/status  ASSESSMENT:   59y.o. male with medical history significant for ESRD on peritoneal dialysis, chronic systolic CHF (EF 382-80%by TTE 09/18/2018), CAD, insulin-dependent type 2 diabetes, anemia of chronic disease, hypertension, hyperlipidemia, peripheral vascular disease who is admitted with acute respiratory failure with hypoxia due to acute on chronic systolic CHF in setting of ESRD on PD.   Met with pt in room today. Pt reports that he is not feeling well today but that he did manage to eat most of his breakfast. Per chart, pt is documented to be eating 50-75% of his meals in hospital. Pt was offered a butter pecan Nepro this morning but reports that he prefers vanilla. RD discussed with patient the importance of adequate nutrition needed to promote wound healing and replace losses from dialysis. Pt with widespread ecchymosis; RD highly suspects pt with scurvy given his chronic dialysis, ecchymosis and non healing wounds. RD will add supplements and vitamins to help pt meet his estimated needs. Pt will need to continue these after discharge. Per chart, pt's dry weight is 99kg; pt is currently up ~9kg from his UBW.   Pt is at high risk for developing malnutrition   Medications reviewed and include: aspirin, phoslo, epoetin, heparin, rena-vite, protonix, KCl  Labs reviewed: K 3.4(L), BUN 32(H), creat  4.85(H) Hgb 8.2(L), Hct 26.2(L)  NUTRITION - FOCUSED PHYSICAL EXAM:    Most Recent Value  Orbital Region  Mild depletion  Upper Arm Region  Mild depletion  Thoracic and Lumbar Region  Mild depletion  Buccal Region  Mild depletion  Temple Region  Mild depletion  Clavicle Bone Region  Mild depletion  Clavicle and Acromion Bone Region  Mild depletion  Scapular Bone Region  No depletion  Dorsal Hand  No depletion  Patellar Region  unable to assess  Anterior Thigh Region  unable to assess  Posterior Calf Region  unable to assess   Edema (RD Assessment)  Moderate   Hair  Reviewed  Eyes  Reviewed  Mouth  Reviewed  Skin  Reviewed [ecchymosis]  Nails  Reviewed     Diet Order:   Diet Order            Diet regular Room service appropriate? Yes; Fluid consistency: Thin; Fluid restriction: 1200 mL Fluid  Diet effective now             EDUCATION NEEDS:   Education needs have been addressed  Skin:  Skin Assessment: Reviewed RN Assessment(sacrum:  3 cm x 2.4 cm, Necrotic toes with nonhealing surgical incisions from previous amputations)  Last BM:  5/17- type 4  Height:   Ht Readings from Last 1 Encounters:  01/15/20 _0  (1.753 m)    Weight:   Wt Readings from Last 1 Encounters:  01/17/20 108.4 kg    Ideal Body Weight:  72.7 kg  BMI:  Body mass index is 35.29 kg/m.  Estimated Nutritional Needs:  Kcal:  2200-2500kcal/day  Protein:  110-125g/day  Fluid:  UOP +1L  Koleen Distance MS, RD, LDN Please refer to [facility] for RD and/or RD on-call/weekend/after hours pager

## 2020-01-18 ENCOUNTER — Inpatient Hospital Stay: Payer: Medicare Other

## 2020-01-18 DIAGNOSIS — E1129 Type 2 diabetes mellitus with other diabetic kidney complication: Secondary | ICD-10-CM

## 2020-01-18 DIAGNOSIS — E1159 Type 2 diabetes mellitus with other circulatory complications: Secondary | ICD-10-CM

## 2020-01-18 DIAGNOSIS — I10 Essential (primary) hypertension: Secondary | ICD-10-CM

## 2020-01-18 LAB — RENAL FUNCTION PANEL
Albumin: 3 g/dL — ABNORMAL LOW (ref 3.5–5.0)
Anion gap: 12 (ref 5–15)
BUN: 24 mg/dL — ABNORMAL HIGH (ref 8–23)
CO2: 29 mmol/L (ref 22–32)
Calcium: 7.8 mg/dL — ABNORMAL LOW (ref 8.9–10.3)
Chloride: 98 mmol/L (ref 98–111)
Creatinine, Ser: 3.74 mg/dL — ABNORMAL HIGH (ref 0.61–1.24)
GFR calc Af Amer: 19 mL/min — ABNORMAL LOW (ref >60)
GFR calc non Af Amer: 16 mL/min — ABNORMAL LOW (ref >60)
Glucose, Bld: 97 mg/dL (ref 70–99)
Phosphorus: 3.5 mg/dL (ref 2.5–4.6)
Potassium: 3.2 mmol/L — ABNORMAL LOW (ref 3.5–5.1)
Sodium: 139 mmol/L (ref 135–145)

## 2020-01-18 LAB — BLOOD GAS, VENOUS
Acid-Base Excess: 0.3 mmol/L (ref 0.0–2.0)
Bicarbonate: 27.3 mmol/L (ref 20.0–28.0)
FIO2: 100
O2 Saturation: 71.4 %
Patient temperature: 37
pCO2, Ven: 53 mmHg (ref 44.0–60.0)
pH, Ven: 7.32 (ref 7.250–7.430)
pO2, Ven: 41 mmHg (ref 32.0–45.0)

## 2020-01-18 LAB — CBC
HCT: 26.5 % — ABNORMAL LOW (ref 39.0–52.0)
Hemoglobin: 8 g/dL — ABNORMAL LOW (ref 13.0–17.0)
MCH: 29.7 pg (ref 26.0–34.0)
MCHC: 30.2 g/dL (ref 30.0–36.0)
MCV: 98.5 fL (ref 80.0–100.0)
Platelets: 214 10*3/uL (ref 150–400)
RBC: 2.69 MIL/uL — ABNORMAL LOW (ref 4.22–5.81)
RDW: 17 % — ABNORMAL HIGH (ref 11.5–15.5)
WBC: 11.2 10*3/uL — ABNORMAL HIGH (ref 4.0–10.5)
nRBC: 0 % (ref 0.0–0.2)

## 2020-01-18 MED ORDER — FLUOXETINE HCL 20 MG PO CAPS
20.0000 mg | ORAL_CAPSULE | Freq: Every day | ORAL | Status: DC
  Administered 2020-01-19 – 2020-01-21 (×3): 20 mg via ORAL
  Filled 2020-01-18 (×4): qty 1

## 2020-01-18 MED ORDER — EPOETIN ALFA 10000 UNIT/ML IJ SOLN
10000.0000 [IU] | INTRAMUSCULAR | Status: DC
  Administered 2020-01-18 – 2020-01-20 (×2): 10000 [IU] via INTRAVENOUS
  Filled 2020-01-18: qty 1

## 2020-01-18 MED ORDER — ALBUMIN HUMAN 25 % IV SOLN
25.0000 g | Freq: Once | INTRAVENOUS | Status: AC
  Administered 2020-01-18: 25 g via INTRAVENOUS
  Filled 2020-01-18: qty 100

## 2020-01-18 NOTE — Consult Note (Signed)
[facility] Psychiatry Consult   Reason for Consult:  Depression, adjustment issues capacity issues  Referring Physician:  Mal Misty MD    Patient Identification: Scott [name] MRN:  [id number] Principal Diagnosis: Acute respiratory failure with hypoxia [facility]) Diagnosis:  Principal Problem:   Acute respiratory failure with hypoxia ([place]) Active Problems:   Type 2 diabetes mellitus with other diabetic kidney complication (HCC)   Congestive heart failure (HCC)   Acute on chronic systolic CHF (congestive heart failure) (HCC)   Anemia in ESRD (end-stage renal disease) ([place])   PVD (peripheral vascular disease) ([place])   Hypertension associated with diabetes ([place])   Coronary artery disease   ESRD on peritoneal dialysis ([place])   Hyperlipidemia associated with type 2 diabetes mellitus ([place])   Pressure injury of skin   Total Time spent with patient:   40 plus 30 min additional   Subjective:   Scott [name] is a 59 y.o. male patient admitted with  Multiple medical problems now on dialysis   He has a history of major depression mainly due to chronic medical conditions.  Concern because he initially was considering not continuing dialysis   Here for evaluation and support   HPI:   As above --has elements of major depression with low mood, depression, sadness, anhedonia, lack of energy, motivation enthusiasm, frustration, hopelessness helplessness, lack of concentration Self esteem, preoccupation with sickly role. Lack of sleep at times ---  Past Psychiatric History:  No previous rehab detox inpatient or outpatient Only on prozac before via PCPwith fair results   Risk to Self:  none Risk to Others:  none Prior Inpatient Therapy:  none Prior Outpatient Therapy:  none  Past Medical History:  Past Medical History:  Diagnosis Date  . Acute respiratory failure with hypoxia ([place]) 09/17/2018  . Arthritis    "left arm; right leg" (12/13/2014)  . Asthma   . CHF (congestive heart failure)  ([place])   . Chronic disease anemia    [name] 12/13/2014  . Chronic kidney disease (CKD), stage IV (severe) ([place])    [name] 12/13/2014... on dialysis  . Complication of anesthesia    unable to urinate after CAPD urgery  . Continuous ambulatory peritoneal dialysis status ([place])   . Coronary artery disease    [name] 12/13/2014  . Depression   . Dysrhythmia    patient unaware of irregular heartbeat  . GERD (gastroesophageal reflux disease)   . High cholesterol    [name] 12/13/2014  . Hypertension   . Non-Q wave myocardial infarction ([place])    [name] 12/13/2014  . PVD (peripheral vascular disease) ([place])    [name] 12/13/2014  . Type II diabetes mellitus ([place])     Past Surgical History:  Procedure Laterality Date  . AMPUTATION Right 08/08/2019   Procedure: AMPUTATION RAY GREAT TOE;  Surgeon: Sharlotte Alamo, DPM;  Location: [facility];  Service: Podiatry;  Laterality: Right;  . AMPUTATION TOE Left 05/24/2019   Procedure: Left great toe amputation;  Surgeon: Caroline More, DPM;  Location: [facility];  Service: Podiatry;  Laterality: Left;  . AMPUTATION TOE Left 07/19/2019   Procedure: AMPUTATION 2nd TOE PARTIAL RAY RESECTION;  Surgeon: Sharlotte Alamo, DPM;  Location: [facility];  Service: Podiatry;  Laterality: Left;  . AV FISTULA PLACEMENT Right 04/07/2017   Procedure: ARTERIOVENOUS (AV) FISTULA CREATION ( BRACHIALCEPHALIC );  Surgeon: Katha Cabal, MD;  Location: [facility];  Service: Vascular;  Laterality: Right;  . CAPD INSERTION N/A 04/07/2017   Procedure: LAPAROSCOPIC INSERTION CONTINUOUS AMBULATORY PERITONEAL DIALYSIS  (  CAPD) CATHETER;  Surgeon: Katha Cabal, MD;  Location: [facility];  Service: Vascular;  Laterality: N/A;  . CARDIAC CATHETERIZATION  11/2014  . CENTRAL LINE INSERTION-TUNNELED N/A 08/11/2019   Procedure: CENTRAL LINE INSERTION-TUNNELED ([place]);  Surgeon: Katha Cabal, MD;  Location: [place] [facility];  Service: Cardiovascular;  Laterality: N/A;  . COLONOSCOPY WITH PROPOFOL  N/A 12/01/2017   Procedure: COLONOSCOPY WITH PROPOFOL;  Surgeon: Lin Landsman, MD;  Location: [place];  Service: Endoscopy;  Laterality: N/A;  Diabetic - insulin  . COLONOSCOPY WITH PROPOFOL N/A 12/29/2017   Procedure: COLONOSCOPY WITH PROPOFOL;  Surgeon: Lin Landsman, MD;  Location: [place];  Service: Endoscopy;  Laterality: N/A;  . DIALYSIS/PERMA CATHETER INSERTION N/A 01/18/2017   Procedure: Dialysis/Perma Catheter Insertion;  Surgeon: Algernon Huxley, MD;  Location: [place] [facility];  Service: Cardiovascular;  Laterality: N/A;  . DIALYSIS/PERMA CATHETER INSERTION Right 01/15/2020   Procedure: DIALYSIS/PERMA CATHETER INSERTION;  Surgeon: Algernon Huxley, MD;  Location: [place] [facility];  Service: Cardiovascular;  Laterality: Right;  . DIALYSIS/PERMA CATHETER REMOVAL N/A 07/26/2017   Procedure: DIALYSIS/PERMA CATHETER REMOVAL;  Surgeon: Algernon Huxley, MD;  Location: [place] [facility];  Service: Cardiovascular;  Laterality: N/A;  . DIALYSIS/PERMA CATHETER REMOVAL N/A 10/24/2019   Procedure: DIALYSIS/PERMA CATHETER REMOVAL;  Surgeon: Katha Cabal, MD;  Location: [place] [facility];  Service: Cardiovascular;  Laterality: N/A;  . INCISION AND DRAINAGE OF WOUND Right ~ 10/2014   "4th toe foot"  . IRRIGATION AND DEBRIDEMENT FOOT Left 07/19/2019   Procedure: IRRIGATION AND DEBRIDEMENT FOOT;  Surgeon: Sharlotte Alamo, DPM;  Location: [facility];  Service: Podiatry;  Laterality: Left;  . LOWER EXTREMITY ANGIOGRAPHY Left 05/23/2019   Procedure: Lower Extremity Angiography;  Surgeon: Katha Cabal, MD;  Location: [place] [facility];  Service: Cardiovascular;  Laterality: Left;  . LOWER EXTREMITY ANGIOGRAPHY Left 07/18/2019   Procedure: LOWER EXTREMITY ANGIOGRAPHY;  Surgeon: Katha Cabal, MD;  Location: [place] [facility];  Service: Cardiovascular;  Laterality: Left;  . LOWER EXTREMITY ANGIOGRAPHY Right 08/01/2019   Procedure: LOWER EXTREMITY ANGIOGRAPHY;   Surgeon: Katha Cabal, MD;  Location: [place] [facility];  Service: Cardiovascular;  Laterality: Right;  . LOWER EXTREMITY ANGIOGRAPHY Right 08/04/2019   Procedure: Lower Extremity Angiography (Pedal Approach);  Surgeon: Katha Cabal, MD;  Location: [place] [facility];  Service: Cardiovascular;  Laterality: Right;  . PERCUTANEOUS CORONARY STENT INTERVENTION (PCI-S) N/A 12/14/2014   Procedure: PERCUTANEOUS CORONARY STENT INTERVENTION (PCI-S);  Surgeon: Charolette Forward, MD;  Location: [facility] CATH LAB;  Service: Cardiovascular;  Laterality: N/A;  . PERCUTANEOUS CORONARY STENT INTERVENTION (PCI-S) N/A 12/18/2014   Procedure: PERCUTANEOUS CORONARY STENT INTERVENTION (PCI-S);  Surgeon: Charolette Forward, MD;  Location: [facility] C[facility];  Service: Cardiovascular;  Laterality: N/A;  . POLYPECTOMY  12/01/2017   Procedure: POLYPECTOMY;  Surgeon: Lin Landsman, MD;  Location: [place];  Service: Endoscopy;;  . POLYPECTOMY  12/29/2017   Procedure: POLYPECTOMY;  Surgeon: Lin Landsman, MD;  Location: [place];  Service: Endoscopy;;  . TOE AMPUTATION Right 2015   4th toe   Family History:  Family History  Problem Relation Age of Onset  . Cancer Mother        Lung  . Cancer Father        Lung  . Diabetes Sister   . Diabetes Brother   . CAD Brother   . Hernia Son   . Cancer Brother  Family Psychiatric  History:   No history of psych in family he says   Social History:  Social History   Substance and Sexual Activity  Alcohol Use Not Currently     Social History   Substance and Sexual Activity  Drug Use No    Social History   Socioeconomic History  . Marital status: Significant Other    Spouse name: Not on file  . Number of children: Not on file  . Years of education: Not on file  . Highest education level: Not on file  Occupational History  . Not on file  Tobacco Use  . Smoking status: Former Smoker    Packs/day: 1.00    Years: 30.00    Pack years:  30.00    Types: Cigarettes    Start date: 08/31/1984    Quit date: 11/30/2014    Years since quitting: 5.1  . Smokeless tobacco: Never Used  Substance and Sexual Activity  . Alcohol use: Not Currently  . Drug use: No  . Sexual activity: Not Currently  Other Topics Concern  . Not on file  Social History Narrative   Lives at home with girlfriend and son   Social Determinants of Health   Financial Resource Strain:   . Difficulty of Paying Living Expenses:   Food Insecurity:   . Worried About Charity fundraiser in the Last Year:   . Arboriculturist in the Last Year:   Transportation Needs:   . Film/video editor (Medical):   [name] Lack of Transportation (Non-Medical):   Physical Activity:   . Days of Exercise per Week:   . Minutes of Exercise per Session:   Stress:   . Feeling of Stress :   Social Connections:   . Frequency of Communication with Friends and Family:   . Frequency of Social Gatherings with Friends and Family:   . Attends Religious Services:   . Active Member of Clubs or Organizations:   . Attends Archivist Meetings:   [name] Marital Status:    Additional Social History:    Allergies:  No Known Allergies  Labs:  Results for orders placed or performed during the hospital encounter of 01/12/20 (from the past 48 hour(s))  Urinalysis, Complete w Microscopic     Status: Abnormal   Collection Time: 01/17/20 12:16 AM  Result Value Ref Range   Color, Urine YELLOW (A) YELLOW   APPearance TURBID (A) CLEAR   Specific Gravity, Urine 1.026 1.005 - 1.030   pH 5.0 5.0 - 8.0   Glucose, UA NEGATIVE NEGATIVE mg/dL   Hgb urine dipstick LARGE (A) NEGATIVE   Bilirubin Urine NEGATIVE NEGATIVE   Ketones, ur NEGATIVE NEGATIVE mg/dL   Protein, ur 100 (A) NEGATIVE mg/dL   Nitrite NEGATIVE NEGATIVE   Leukocytes,Ua LARGE (A) NEGATIVE   RBC / HPF 21-50 0 - 5 RBC/hpf   WBC, UA >50 (H) 0 - 5 WBC/hpf   Bacteria, UA NONE SEEN NONE SEEN   Squamous Epithelial / LPF 0-5 0 - 5    WBC Clumps PRESENT    Mucus PRESENT    Budding Yeast PRESENT     Comment: Performed at [facility], [address] [place], [place] [address]  CBC     Status: Abnormal   Collection Time: 01/17/20  6:30 AM  Result Value Ref Range   WBC 10.2 4.0 - 10.5 K/uL   RBC 2.68 (L) 4.22 - 5.81 MIL/uL   Hemoglobin 8.2 (L) 13.0 -  17.0 g/dL   HCT 26.2 (L) 39.0 - 52.0 %   MCV 97.8 80.0 - 100.0 fL   MCH 30.6 26.0 - 34.0 pg   MCHC 31.3 30.0 - 36.0 g/dL   RDW 16.9 (H) 11.5 - 15.5 %   Platelets 219 150 - 400 K/uL   nRBC 0.0 0.0 - 0.2 %    Comment: Performed at [facility], [address] [place], [place] [address]  Basic metabolic panel     Status: Abnormal   Collection Time: 01/17/20  6:30 AM  Result Value Ref Range   Sodium 138 135 - 145 mmol/L   Potassium 3.4 (L) 3.5 - 5.1 mmol/L   Chloride 98 98 - 111 mmol/L   CO2 28 22 - 32 mmol/L   Glucose, Bld 109 (H) 70 - 99 mg/dL    Comment: Glucose reference range applies only to samples taken after fasting for at least 8 hours.   BUN 32 (H) 8 - 23 mg/dL   Creatinine, Ser 4.85 (H) 0.61 - 1.24 mg/dL   Calcium 7.4 (L) 8.9 - 10.3 mg/dL   GFR calc non Af Amer 12 (L) >60 mL/min   GFR calc Af Amer 14 (L) >60 mL/min   Anion gap 12 5 - 15    Comment: Performed at [facility], [address] [place], [place] [address]  Glucose, capillary     Status: None   Collection Time: 01/17/20  8:12 PM  Result Value Ref Range   Glucose-Capillary 96 70 - 99 mg/dL    Comment: Glucose reference range applies only to samples taken after fasting for at least 8 hours.  Blood gas, venous     Status: None   Collection Time: 01/18/20  4:55 AM  Result Value Ref Range   FIO2 100.00    Delivery systems NON-REBREATHER OXYGEN MASK    pH, Ven 7.32 7.250 - 7.430   pCO2, Ven 53 44.0 - 60.0 mmHg   pO2, Ven 41.0 32.0 - 45.0 mmHg   Bicarbonate 27.3 20.0 - 28.0 mmol/L   Acid-Base Excess 0.3 0.0 - 2.0 mmol/L   O2 Saturation 71.4 %   Patient  temperature 37.0    Collection site VEIN    Sample type VENOUS     Comment: Performed at [facility], [address] [place], [place] [address]  Renal function panel     Status: Abnormal   Collection Time: 01/18/20 10:39 AM  Result Value Ref Range   Sodium 139 135 - 145 mmol/L   Potassium 3.2 (L) 3.5 - 5.1 mmol/L   Chloride 98 98 - 111 mmol/L   CO2 29 22 - 32 mmol/L   Glucose, Bld 97 70 - 99 mg/dL    Comment: Glucose reference range applies only to samples taken after fasting for at least 8 hours.   BUN 24 (H) 8 - 23 mg/dL   Creatinine, Ser 3.74 (H) 0.61 - 1.24 mg/dL   Calcium 7.8 (L) 8.9 - 10.3 mg/dL   Phosphorus 3.5 2.5 - 4.6 mg/dL   Albumin 3.0 (L) 3.5 - 5.0 g/dL   GFR calc non Af Amer 16 (L) >60 mL/min   GFR calc Af Amer 19 (L) >60 mL/min   Anion gap 12 5 - 15    Comment: Performed at [facility], [address] [place], [place] [address]  CBC     Status: Abnormal   Collection Time: 01/18/20 10:39 AM  Result Value Ref Range   WBC 11.2 (  H) 4.0 - 10.5 K/uL   RBC 2.69 (L) 4.22 - 5.81 MIL/uL   Hemoglobin 8.0 (L) 13.0 - 17.0 g/dL   HCT 26.5 (L) 39.0 - 52.0 %   MCV 98.5 80.0 - 100.0 fL   MCH 29.7 26.0 - 34.0 pg   MCHC 30.2 30.0 - 36.0 g/dL   RDW 17.0 (H) 11.5 - 15.5 %   Platelets 214 150 - 400 K/uL   nRBC 0.0 0.0 - 0.2 %    Comment: Performed at [facility], [address] [place], [place] [address]    Current Facility-Administered Medications  Medication Dose Route Frequency Provider Last Rate Last Admin  . 0.9 %  sodium chloride infusion   Intravenous PRN Algernon Huxley, MD   Stopped at 01/13/20 0231  . acetaminophen (TYLENOL) tablet 650 mg  650 mg Oral Q6H PRN Algernon Huxley, MD   650 mg at 01/16/20 0428   Or  . acetaminophen (TYLENOL) suppository 650 mg  650 mg Rectal Q6H PRN Algernon Huxley, MD      . ascorbic acid (VITAMIN C) tablet 500 mg  500 mg Oral BID Jennye Boroughs, MD   500 mg at 01/17/20 2215  . aspirin EC tablet 81 mg  81 mg Oral  Daily Algernon Huxley, MD   81 mg at 01/17/20 1007  . atorvastatin (LIPITOR) tablet 40 mg  40 mg Oral Daily Algernon Huxley, MD   40 mg at 01/17/20 1008  . calcium acetate (PHOSLO) capsule 2,001 mg  2,001 mg Oral TID WC Algernon Huxley, MD   2,001 mg at 01/17/20 1301  . Chlorhexidine Gluconate Cloth 2 % PADS 6 each  6 each Topical Q0600 Algernon Huxley, MD   6 each at 01/18/20 [phone]  . collagenase (SANTYL) ointment   Topical Daily Algernon Huxley, MD   Given at 01/17/20 1831  . epoetin alfa (EPOGEN) injection 10,000 Units  10,000 Units Intravenous Q T,Th,Sa-HD Lavonia Dana, MD   10,000 Units at 01/18/20 1040  . feeding supplement (NEPRO CARB STEADY) liquid 237 mL  237 mL Oral BID BM Jennye Boroughs, MD   237 mL at 01/17/20 1303  . heparin injection 5,000 Units  5,000 Units Subcutaneous Q8H Algernon Huxley, MD   5,000 Units at 01/18/20 [phone]  . HYDROcodone-acetaminophen (NORCO) 7.5-325 MG per tablet 1 tablet  1 tablet Oral Q6H PRN Max Sane, MD      . HYDROcodone-acetaminophen (NORCO/VICODIN) 5-325 MG per tablet 1 tablet  1 tablet Oral Q6H PRN Max Sane, MD   1 tablet at 01/17/20 0003  . midodrine (PROAMATINE) tablet 10 mg  10 mg Oral TID WC Max Sane, MD   10 mg at 01/17/20 1745  . multivitamin (RENA-VIT) tablet 1 tablet  1 tablet Oral QHS Jennye Boroughs, MD   1 tablet at 01/17/20 2215  . ondansetron (ZOFRAN) tablet 4 mg  4 mg Oral Q6H PRN Algernon Huxley, MD       Or  . ondansetron (ZOFRAN) injection 4 mg  4 mg Intravenous Q6H PRN Algernon Huxley, MD      . ondansetron (ZOFRAN) injection 4 mg  4 mg Intravenous Q6H PRN Algernon Huxley, MD      . pantoprazole (PROTONIX) EC tablet 40 mg  40 mg Oral Daily Algernon Huxley, MD   40 mg at 01/17/20 1008  . sodium chloride flush (NS) 0.9 % injection 3 mL  3 mL Intravenous Q12H Dew, Erskine Squibb, MD  3 mL at 01/17/20 2305      Psychiatric Specialty Exam: Alert cooperative looks sickly and haggard Oriented to person place date and time  Consciousness not clouded or fluctuant    Mood depressed affect constricted  Rapport fair --as best as possible --came back from Dialysis   No movement disordered problems   Thought content --preoccupied with sickly role issues of chronic illness suffering and all.   Thought process --no frank psychosis  Delusions, hallucinations   Concentration normal   Speech --slightly low tone rate and volume   Memory actually okay with short term and longer term  Is anxious made slight errors with various memory test questions  Gait and station --cannot assess  Abstraction and proverbs okay   Judgement insight reliability okay --normal   His issues wax and wane ---he does know implications of his illness and what would happen if he decided to stop treatment   SI and HI ---no active plans or hints   Fund of knowledge and intelligence normal   Contracts for safety                                                          Assets:  Supportive family   ADL's:  Challenging due to chronic renal issues but manages with wife and family  Cognition:  No other new change   Sleep:   all three cycles sometimes affected      Treatment Plan Summary:  Ongoing issues with depression due to medical problems ---at this time has capacity for consent   Prozac 20 mg restarted today   Met with wife and daughter and they are meeting with in house therapist for better support and referrals for outpatient at this time   Still has a sitter ---but can d/c tomorrow    No active SI HI or plans now       Disposition:   Home with psych support referrals when stable    Eulas Post, MD 01/18/2020 1:45 PM

## 2020-01-18 NOTE — Progress Notes (Signed)
Post HD     01/18/20 1250  Vital Signs  Resp (!) 32  BP (!) 100/32  BP Location Left Arm  BP Method Automatic  Patient Position (if appropriate) Lying  Oxygen Therapy  SpO2 99 %  O2 Device Nasal Cannula  O2 Flow Rate (L/min) 4 L/min  Post-Hemodialysis Assessment  Rinseback Volume (mL) 250 mL  KECN 65.4 V  Dialyzer Clearance Lightly streaked  Duration of HD Treatment -hour(s) 3.5 hour(s)  Hemodialysis Intake (mL) 500 mL  UF Total -Machine (mL) 2505 mL  Net UF (mL) 2005 mL  Tolerated HD Treatment Yes  Post-Hemodialysis Comments  (none)  AVG/AVF Arterial Site Held (minutes)  (n/a)  AVG/AVF Venous Site Held (minutes)  (n/a)

## 2020-01-18 NOTE — Progress Notes (Signed)
Pre HD Machine Checks    01/18/20 0846  Machine Checks  Machine Number 6  Station Number 1 ([id number])  UF/Alarm Test Passed  Conductivity: Meter 14  Conductivity: Machine  13.9  pH 7.2  Reverse Osmosis 5  Normal Saline Lot Number [id number]  Dialyzer Lot Number [id number]  Disposable Set Lot Number [id number]  Machine Temperature 98.6 F (37 C)  Musician and Audible Yes  Blood Lines Intact and Secured Yes  Pre Treatment Patient Checks  Vascular access used during treatment Catheter  HD catheter dressing before treatment WDL  Patient is receiving dialysis in a chair  (no)  Hepatitis B Surface Antigen Results Negative  Date Hepatitis B Surface Antigen Drawn 01/13/20  Isolation Initiated  (no)  Hepatitis B Surface Antibody  (>10)  Date Hepatitis B Surface Antibody Drawn 01/13/20  Hemodialysis Consent Verified Yes  Hemodialysis Standing Orders Initiated Yes  ECG (Telemetry) Monitor On Yes  Prime Ordered Normal Saline  Length of  DialysisTreatment -hour(s) 3.5 Hour(s)  Dialysis Treatment Comments  (Na 140)  Dialyzer Elisio 17H NR  Dialysate 2K;2.5 Ca  Dialysate Flow Ordered 600  Blood Flow Rate Ordered 400 mL/min  Ultrafiltration Goal 3 Liters  Blood Products Ordered  (albumin)  Dialysis Blood Pressure Support Ordered Normal Saline

## 2020-01-18 NOTE — Progress Notes (Signed)
[facility]  ROUNDING NOTE   Subjective:   Patient met with palliative care yesterday. Today after speaking to his children and his girlfriend who lives with him. He wants to continue dialysis.   Peritoneal dialysis yesterday with very little ultrafiltration even at 4.25% dextrose.   Placed on hemodialysis treatment this morning. Seen and examined on treatment. Receiving IV albumin. UF goal of 3 liters.     HEMODIALYSIS FLOWSHEET:  Blood Flow Rate (mL/min): 375 mL/min Arterial Pressure (mmHg): -120 mmHg Venous Pressure (mmHg): 260 mmHg Transmembrane Pressure (mmHg): 80 mmHg Ultrafiltration Rate (mL/min): 730 mL/min Dialysate Flow Rate (mL/min): 600 ml/min Conductivity: Machine : 13.8 Conductivity: Machine : 13.8 Dialysis Fluid Bolus: Normal Saline Bolus Amount (mL): 250 mL    Objective:  Vital signs in last 24 hours:  Temp:  [97.3 F (36.3 C)-98.2 F (36.8 C)] 97.3 F (36.3 C) (05/20 0912) Pulse Rate:  [73-84] 79 (05/20 1030) Resp:  [18-40] 25 (05/20 1030) BP: (95-159)/(46-145) 159/145 (05/20 1030) SpO2:  [80 %-100 %] 100 % (05/20 1030)  Weight change:  Filed Weights   01/15/20 0500 01/15/20 1500 01/17/20 0432  Weight: 110.5 kg 110.5 kg 108.4 kg    Intake/Output: I/O last 3 completed shifts: In: 227 [P.O.:224; I.V.:3] Out: 421 [Urine:50; Other:371]   Intake/Output this shift:  Total I/O In: 240 [P.O.:240] Out: -   Physical Exam: General: NAD, laying in bed  Head: Normocephalic, atraumatic. Moist oral mucosal membranes  Eyes: Anicteric, PERRL  Neck: Supple, trachea midline  Lungs:  Bilateral crackles  Heart: Regular rate and rhythm  Abdomen:  Abdominal wall edema  Extremities:  ++ peripheral edema.  Neurologic: Nonfocal, moving all four extremities  Skin: No lesions  Access: Peritoneal dialysis catheter Right arm AVF - not functioning      Basic Metabolic Panel: Recent Labs  Lab 01/12/20 2119 01/12/20 2119 01/13/20 0009  01/13/20 0028 01/13/20 0028 01/14/20 0559 01/16/20 0401 01/17/20 0630  NA 136  --   --  136  --  140 139 138  K 4.1  --   --  3.7  --  3.7 3.7 3.4*  CL 102  --   --  102  --  103 100 98  CO2 22  --   --  22  --  _0 GLUCOSE 146*  --   --  124*  --  88 114* 109*  BUN 52*  --   --  52*  --  49* 41* 32*  CREATININE 6.60*   < > 6.68* 6.63*  --  6.06* 6.05* 4.85*  CALCIUM 7.4*   < >  --  7.2*   < > 7.2* 7.0* 7.4*   < > = values in this interval not displayed.    Liver Function Tests: Recent Labs  Lab 01/12/20 2119  AST 18  ALT 13  ALKPHOS 122  BILITOT 0.8  PROT 6.2*  ALBUMIN 2.4*   No results for input(s): LIPASE, AMYLASE in the last 168 hours. No results for input(s): AMMONIA in the last 168 hours.  CBC: Recent Labs  Lab 01/12/20 2119 01/13/20 0028 01/14/20 0559 01/16/20 0401 01/17/20 0630  WBC 10.4 8.5 7.0 9.5 10.2  NEUTROABS 9.2*  --   --   --   --   HGB 9.3* 8.6* 8.2* 8.5* 8.2*  HCT 29.9* 27.6* 26.9* 27.9* 26.2*  MCV 96.1 97.2 98.5 99.3 97.8  PLT 288 268 237 245 219    Cardiac Enzymes: No results for input(s):  CKTOTAL, CKMB, CKMBINDEX, TROPONINI in the last 168 hours.  BNP: Invalid input(s): POCBNP  CBG: Recent Labs  Lab 01/17/20 2012  GLUCAP 96    Microbiology: Results for orders placed or performed during the hospital encounter of 01/12/20  SARS Coronavirus 2 by RT PCR (hospital order, performed in [facility] hospital lab) Nasopharyngeal Nasopharyngeal Swab     Status: None   Collection Time: 01/12/20  9:08 PM   Specimen: Nasopharyngeal Swab  Result Value Ref Range Status   SARS Coronavirus 2 NEGATIVE NEGATIVE Final    Comment: (NOTE) SARS-CoV-2 target nucleic acids are NOT DETECTED. The SARS-CoV-2 RNA is generally detectable in upper and lower respiratory specimens during the acute phase of infection. The lowest concentration of SARS-CoV-2 viral copies this assay can detect is 250 copies / mL. A negative result does not preclude  SARS-CoV-2 infection and should not be used as the sole basis for treatment or other patient management decisions.  A negative result may occur with improper specimen collection / handling, submission of specimen other than nasopharyngeal swab, presence of viral mutation(s) within the areas targeted by this assay, and inadequate number of viral copies (<250 copies / mL). A negative result must be combined with clinical observations, patient history, and epidemiological information. Fact Sheet for Patients:   StrictlyIdeas.no Fact Sheet for Healthcare Providers: BankingDealers.co.za This test is not yet approved or cleared  by the [place] FDA and has been authorized for detection and/or diagnosis of SARS-CoV-2 by FDA under an Emergency Use Authorization (EUA).  This EUA will remain in effect (meaning this test can be used) for the duration of the COVID-19 declaration under Section 564(b)(1) of the Act, 21 U.S.C. section 360bbb-3(b)(1), unless the authorization is terminated or revoked sooner. Performed at [facility], [address] [place], [place] [address]   Body fluid culture     Status: None   Collection Time: 01/13/20 10:45 AM   Specimen: Peritoneal Washings; Body Fluid  Result Value Ref Range Status   Specimen Description   Final    PERITONEAL Performed at [facility], [address] [place], [place] [address]    Special Requests   Final    NONE Performed at [facility], [place], [place] [address]    Gram Stain NO WBC SEEN NO ORGANISMS SEEN   Final   Culture   Final    NO GROWTH 3 DAYS Performed at [facility], [place] [address], [place], [place] [address]    Report Status 01/17/2020 FINAL  Final  Urine Culture     Status: None   Collection Time: 01/14/20  3:30 AM   Specimen: Urine, Random  Result Value Ref Range Status   Specimen Description   Final     URINE, RANDOM Performed at [facility], [address] [place], [place] [address]    Special Requests   Final    NONE Performed at [facility], [address] [place], [place] [address]    Culture   Final    NO GROWTH Performed at [facility], [place] [address] [place], [place] [address]    Report Status 01/15/2020 FINAL  Final    Coagulation Studies: No results for input(s): LABPROT, INR in the last 72 hours.  Urinalysis: Recent Labs    01/17/20 0016  COLORURINE YELLOW*  LABSPEC 1.026  PHURINE 5.0  GLUCOSEU NEGATIVE  HGBUR LARGE*  BILIRUBINUR NEGATIVE  KETONESUR NEGATIVE  PROTEINUR 100*  NITRITE NEGATIVE  LEUKOCYTESUR LARGE*      Imaging: CT ANGIO CHEST PE W OR WO CONTRAST  Result Date: 01/16/2020 CLINICAL DATA:  Shortness of breath.  Hypoxia. EXAM: CT ANGIOGRAPHY CHEST WITH CONTRAST TECHNIQUE: Multidetector CT imaging of the chest was performed using the standard protocol during bolus administration of intravenous contrast. Multiplanar CT image reconstructions and MIPs were obtained to evaluate the vascular anatomy. CONTRAST:  180m OMNIPAQUE IOHEXOL 350 MG/ML SOLN COMPARISON:  None recent FINDINGS: Cardiovascular: Contrast injection is sufficient to demonstrate satisfactory opacification of the pulmonary arteries to the segmental level. There is no pulmonary embolus. The main pulmonary artery is within normal limits for size. There is no CT evidence of acute right heart strain. There are atherosclerotic changes of the thoracic aorta. The ascending thoracic aorta is aneurysmal measuring at least 4 cm in diameter. Heart size is significantly enlarged. There are advanced coronary artery calcifications. There is a dialysis catheter terminating at cavoatrial junction. There is reflux of contrast into the IVC. Mediastinum/Nodes: --No mediastinal or hilar lymphadenopathy. --No axillary lymphadenopathy. --No supraclavicular lymphadenopathy. --Normal  thyroid gland. --The esophagus is unremarkable Lungs/Pleura: There is a large right-sided pleural effusion. Evaluation of the lung fields is limited by respiratory motion artifact. There appears to be some bilateral interlobular septal thickening with ground-glass airspace opacities suggestive of underlying pulmonary edema. There is significant compressive atelectasis involving the right lower lobe. Upper Abdomen: The liver appears to demonstrate a borderline nodular contour. Musculoskeletal: There are mild degenerative changes throughout the visualized thoracolumbar spine without evidence for an acute displaced fracture. There is subcutaneous edema involving the patient's anterior chest wall on the right, felt to be related to dialysis catheter placement. Review of the MIP images confirms the above findings. IMPRESSION: 1. No acute pulmonary embolism. 2. Cardiomegaly with findings could consistent with congestive heart failure including a large right-sided pleural effusion. 3. Questionable cirrhosis. 4. Ascending thoracic aortic aneurysm measuring approximately 4 cm. Recommend annual imaging followup by CTA or MRA. This recommendation follows 2010 ACCF/AHA/AATS/ACR/ASA/SCA/SCAI/SIR/STS/SVM Guidelines for the Diagnosis and Management of Patients with Thoracic Aortic Disease. Circulation. 2010; 121:: W446-K863 Aortic aneurysm NOS (ICD10-I71.9) Aortic Atherosclerosis (ICD10-I70.0). Electronically Signed   By: CConstance HolsterM.D.   On: 01/16/2020 22:11   [place]Venous Img Upper Uni Right(DVT)  Result Date: 01/17/2020 CLINICAL DATA:  Right upper extremity edema. History of central line placement. Evaluate for DVT. EXAM: RIGHT UPPER EXTREMITY VENOUS DOPPLER ULTRASOUND TECHNIQUE: Gray-scale sonography with graded compression, as well as color Doppler and duplex ultrasound were performed to evaluate the upper extremity deep venous system from the level of the subclavian vein and including the jugular, axillary,  basilic, radial, ulnar and upper cephalic vein. Spectral Doppler was utilized to evaluate flow at rest and with distal augmentation maneuvers. COMPARISON:  None. FINDINGS: Contralateral Subclavian Vein: Respiratory phasicity is normal and symmetric with the symptomatic side. No evidence of thrombus. Normal compressibility. Internal Jugular Vein: No evidence of thrombus. Normal compressibility, respiratory phasicity and response to augmentation. Subclavian Vein: No evidence of thrombus. Normal compressibility, respiratory phasicity and response to augmentation. Axillary Vein: No evidence of thrombus. Normal compressibility, respiratory phasicity and response to augmentation. Cephalic Vein: No evidence of thrombus. Normal compressibility, respiratory phasicity and response to augmentation. Basilic Vein: No evidence of thrombus. Normal compressibility, respiratory phasicity and response to augmentation. Brachial Veins: No evidence of thrombus within either of the paired brachial veins. Normal compressibility, respiratory phasicity and response to augmentation. Radial Veins: No evidence of thrombus. Normal compressibility, respiratory phasicity  and response to augmentation. Ulnar Veins: No evidence of thrombus. Normal compressibility, respiratory phasicity and response to augmentation. Venous Reflux:  None visualized. Other Findings: There is a moderate amount of subcutaneous edema at the level of the right upper arm (images 12 through 14) IMPRESSION: No evidence of DVT within the right upper extremity. Electronically Signed   By: Sandi Mariscal M.D.   On: 01/17/2020 16:36   DG Chest Port 1 View  Result Date: 01/18/2020 CLINICAL DATA:  Acute respiratory distress EXAM: PORTABLE CHEST 1 VIEW COMPARISON:  01/12/2020 FINDINGS: Cardiomegaly. Dialysis catheter on the right with tip at the upper cavoatrial junction. Interstitial opacity and moderate right pleural effusion. No pneumothorax. IMPRESSION: CHF pattern similar to 6  days ago. Unchanged right pleural effusion which was moderate by recent CT. Electronically Signed   By: Monte Fantasia M.D.   On: 01/18/2020 05:14   ECHOCARDIOGRAM COMPLETE  Result Date: 01/16/2020    ECHOCARDIOGRAM REPORT   Patient Name:   AADITYA LETIZIA Ore Date of Exam: 01/16/2020 Medical Rec #:  [id number]        Height:       69.0 in Accession #:    [id number]       Weight:       243.6 lb Date of Birth:  [birth year]         BSA:          2.247 m Patient Age:    59 years         BP:           97/41 mmHg Patient Gender: M                HR:           78 bpm. Exam Location:  [facility] Procedure: 2D Echo, Color Doppler and Cardiac Doppler Indications:     I50.9 Congestive Heart Failure  History:         Patient has prior history of Echocardiogram examinations, most                  recent 09/18/2018. CHF, CAD, PVD, ESRD stage IV; Risk                  Factors:Diabetes, Hypertension and HCL.  Sonographer:     Charmayne Sheer RDCS (AE) Referring Phys:  [id number] [facility] Diagnosing Phys: Nelva Bush MD IMPRESSIONS  1. Left ventricular ejection fraction, by estimation, is 25 to 30%. The left ventricle has severely decreased function. The left ventricle demonstrates global hypokinesis. The left ventricular internal cavity size was moderately dilated. There is mild left ventricular hypertrophy. Left ventricular diastolic parameters are consistent with Grade II diastolic dysfunction (pseudonormalization). Elevated left atrial pressure.  2. Pulmonary artery pressure is at least mildly elevated (PASP 30-35 mmHg plus central venous pressure). Right ventricular systolic function is moderately reduced. The right ventricular size is mildly enlarged.  3. Left atrial size was mildly dilated.  4. Right atrial size was moderately dilated.  5. The mitral valve is normal in structure. Trivial mitral valve regurgitation. No evidence of mitral stenosis.  6. The aortic valve is tricuspid. Aortic valve regurgitation is not visualized. Mild  aortic valve sclerosis is present, with no evidence of aortic valve stenosis.  7. Mildly dilated pulmonary artery. Comparison(s): A prior study was performed on 09/18/2018. LVEF has decreased from 35-40% to 25-30%. FINDINGS  Left Ventricle: Left ventricular ejection fraction, by estimation, is 25 to 30%. The left ventricle has severely decreased function.  The left ventricle demonstrates global hypokinesis. The left ventricular internal cavity size was moderately dilated. There is mild left ventricular hypertrophy. Left ventricular diastolic parameters are consistent with Grade II diastolic dysfunction (pseudonormalization). Elevated left atrial pressure. Right Ventricle: Pulmonary artery pressure is at least mildly elevated (PASP 30-35 mmHg plus central venous pressure). The right ventricular size is mildly enlarged. No increase in right ventricular wall thickness. Right ventricular systolic function is moderately reduced. Left Atrium: Left atrial size was mildly dilated. Right Atrium: Right atrial size was moderately dilated. Pericardium: Trivial pericardial effusion is present. The pericardial effusion is posterior to the left ventricle. Mitral Valve: The mitral valve is normal in structure. Trivial mitral valve regurgitation. No evidence of mitral valve stenosis. MV peak gradient, 4.2 mmHg. The mean mitral valve gradient is 2.0 mmHg. Tricuspid Valve: The tricuspid valve is grossly normal. Tricuspid valve regurgitation is mild. Aortic Valve: The aortic valve is tricuspid. . There is moderate thickening and mild calcification of the aortic valve. Aortic valve regurgitation is not visualized. Mild aortic valve sclerosis is present, with no evidence of aortic valve stenosis. There  is moderate thickening of the aortic valve. There is mild calcification of the aortic valve. Aortic valve mean gradient measures 4.0 mmHg. Aortic valve peak gradient measures 8.1 mmHg. Aortic valve area, by VTI measures 1.83 cm. Pulmonic  Valve: The pulmonic valve was grossly normal. Pulmonic valve regurgitation is trivial. No evidence of pulmonic stenosis. Aorta: The aortic root is normal in size and structure. Pulmonary Artery: The pulmonary artery is mildly dilated. Venous: The inferior vena cava was not well visualized. IAS/Shunts: The interatrial septum was not well visualized.  LEFT VENTRICLE PLAX 2D LVIDd:         6.00 cm      Diastology LVIDs:         5.39 cm      LV e' lateral:   5.77 cm/s LV PW:         0.94 cm      LV E/e' lateral: 14.9 LV IVS:        1.11 cm      LV e' medial:    3.59 cm/s LVOT diam:     2.10 cm      LV E/e' medial:  23.9 LV SV:         41 LV SV Index:   18 LVOT Area:     3.46 cm  LV Volumes (MOD) LV vol d, MOD A2C: 197.0 ml LV vol d, MOD A4C: 174.0 ml LV vol s, MOD A2C: 142.0 ml LV vol s, MOD A4C: 131.0 ml LV SV MOD A2C:     55.0 ml LV SV MOD A4C:     174.0 ml LV SV MOD BP:      48.8 ml RIGHT VENTRICLE RV Basal diam:  3.64 cm LEFT ATRIUM             Index       RIGHT ATRIUM           Index LA diam:        4.40 cm 1.96 cm/m  RA Area:     26.30 cm LA Vol (A2C):   76.6 ml 34.10 ml/m RA Volume:   97.90 ml  43.58 ml/m LA Vol (A4C):   66.1 ml 29.42 ml/m LA Biplane Vol: 74.7 ml 33.25 ml/m  AORTIC VALVE                   PULMONIC VALVE AV  Area (Vmax):    1.98 cm    PV Vmax:       0.99 m/s AV Area (Vmean):   1.87 cm    PV Vmean:      57.200 cm/s AV Area (VTI):     1.83 cm    PV VTI:        0.159 m AV Vmax:           142.00 cm/s PV Peak grad:  3.9 mmHg AV Vmean:          88.700 cm/s PV Mean grad:  2.0 mmHg AV VTI:            0.221 m AV Peak Grad:      8.1 mmHg AV Mean Grad:      4.0 mmHg LVOT Vmax:         81.30 cm/s LVOT Vmean:        48.000 cm/s LVOT VTI:          0.117 m LVOT/AV VTI ratio: 0.53  AORTA Ao Root diam: 3.40 cm MITRAL VALVE               TRICUSPID VALVE MV Area (PHT): 5.66 cm    TR Peak grad:   31.8 mmHg MV Peak grad:  4.2 mmHg    TR Vmax:        282.00 cm/s MV Mean grad:  2.0 mmHg MV Vmax:       1.02 m/s     SHUNTS MV Vmean:      67.5 cm/s   Systemic VTI:  0.12 m MV Decel Time: 134 msec    Systemic Diam: 2.10 cm MV E velocity: 85.90 cm/s MV A velocity: 83.40 cm/s MV E/A ratio:  1.03 Harrell Gave End MD Electronically signed by Nelva Bush MD Signature Date/Time: 01/16/2020/2:27:20 PM    Final      Medications:   . sodium chloride Stopped (01/13/20 0231)  . dialysis solution 4.25% low-MG/low-CA     . vitamin C  500 mg Oral BID  . aspirin EC  81 mg Oral Daily  . atorvastatin  40 mg Oral Daily  . calcium acetate  2,001 mg Oral TID WC  . Chlorhexidine Gluconate Cloth  6 each Topical Q0600  . collagenase   Topical Daily  . epoetin (EPOGEN/PROCRIT) injection  10,000 Units Intravenous Q M,W,F-HD  . feeding supplement (NEPRO CARB STEADY)  237 mL Oral BID BM  . gentamicin cream  1 application Topical Daily  . heparin  5,000 Units Subcutaneous Q8H  . midodrine  10 mg Oral TID WC  . multivitamin  1 tablet Oral QHS  . pantoprazole  40 mg Oral Daily  . sodium chloride flush  3 mL Intravenous Q12H   sodium chloride, acetaminophen **OR** acetaminophen, HYDROcodone-acetaminophen, HYDROcodone-acetaminophen, ondansetron **OR** ondansetron (ZOFRAN) IV, ondansetron (ZOFRAN) IV  Assessment/ Plan:  Scott [name] is a 59 y.o. white male with end stage renal disease on peritoneal dialysis, hypertension, peripheral vascular disease, diabetes mellitus type II, asthma , left toe amputation who is admitted to [facility] on 01/12/2020 for ESRD (end stage renal disease) on dialysis ([place]) [N18.6, Z99.2] Acute respiratory failure with hypoxia ([place]) [J96.01] Congestive heart failure, unspecified HF chronicity, unspecified heart failure type ([place]) [I50.9]  [place] 99kg Outpatient Peritoneal dialysis CAPD 4 exchanges 2 liter fills  1. End Stage Renal Disease: transition from peritoneal dialysis to hemodialysis.  Seen and examined on hemodialysis treatment.  Outpatient dialysis planning for  [facility] [address]. TTS schedule - Dialysis today and then possibly tomorrow with ultrafiltration only.    2. Hypotensive: Holding home agents including furosemide - started midodrine.  - IV albumin with HD treatment.   3. Anemia with chronic kidney disease: normocytic. Hemoglobin 8.2 - EPO with TTS dialysis treatments.   4. Secondary Hyperparathyroidism: with hyperphosphatemia and hypocalcemia - calcium acetate with meals.   5. Peripheral vascular disease:  - appreciate wound care, podiatry and vascular  6. Anasarca with hypoalbuminemia:  - IV albumin with HD treatments.    LOS: 6 Lanaya Bennis 5/20/202110:33 AM

## 2020-01-18 NOTE — Significant Event (Addendum)
Dayshift rapid response RN came to reassess patient today since rapid response called last night. Checked with patient's nurse Jun about how patient was doing today and he stated patient was doing about the same as overnight. Patient and family have been in discussion with palliative care about plan of care. Upon rapid response RN's arrival in patient's room, patient was alert and sitting up in bed watching TV. Patient had apparently just de-sated but was 94-98% when this RN assessed. Patient did report feeling short of breath even with o2 sats in 90s. Lungs sounded very diminished and patient was breathing very shallowly. No wheezes or crackles heard. Currently patient to remain on 1A.

## 2020-01-18 NOTE — Progress Notes (Signed)
PT Cancellation Note  Patient Details Name: Scott [name] MRN: [id number] DOB: 03-16-1961   Cancelled Treatment:    Reason Eval/Treat Not Completed: Other (comment). Pt still with temp fem HD cath, plans for HD this morning. Rapid response last night due to low O2 sats, stable and back on 4L of O2. Per chart review, pt with plans to transition to comfort care, pending family meeting today. Will hold at this time and await further POC.   Scott [name] 01/18/2020, 8:45 AM Greggory Stallion, PT, DPT [phone]

## 2020-01-18 NOTE — Progress Notes (Signed)
Pre HD     01/18/20 0910  Vital Signs  Temp (!) 97.3 F (36.3 C)  Temp Source Axillary  Pulse Rate 76  Pulse Rate Source [place]  Resp (!) 40  BP (!) 112/55  BP Location Left Arm  BP Method Automatic  Patient Position (if appropriate) Lying  Oxygen Therapy  SpO2 100 %  O2 Device Nasal Cannula  O2 Flow Rate (L/min) 4 L/min  Pain Assessment  Pain Scale 0-10  Pain Score 0

## 2020-01-18 NOTE — Progress Notes (Signed)
Rapid response called. Patient Oxygen was at 78% on 4 L nasal cannula and patient has been c/o SOB.  Placed on a non-rebreather patient was still 83% took at least 10 min before patient oxygen got back up to 100% on non-rebreather. VBG collected. Chest X-ray ordered. Sharion Settler and rapid tem at bedside.

## 2020-01-18 NOTE — Progress Notes (Signed)
Daily Progress Note   Patient Name: Scott [name]       Date: 01/18/2020 DOB: 05-29-1961  Age: 59 y.o. MRN#: [id number] Attending Physician: Jennye Boroughs, MD Primary Care Physician: [place] Date: 01/12/2020  Reason for Consultation/Follow-up: Establishing goals of care  Subjective: Patient is resting in bed. His three children who he wanted present for [place] meeting are at bedside. His girlfriend is also at bedside who he did not request to be present. Mr. Brearley is not married. He has a partner with whom he had children with, but he is clear, he would want to follow the natural order of decision makers and have his children to make decisions for him if he is unable.   He is tearful and states he does not want to put his children on the spot to make a decision on his transitioning to comfort care, or continuing life prolonging care. His girlfriend quickly and clearly shares that he will continue to do what he can to live, and that they will seek second opinions and potentially 3rd opinions to try to find avenues to keep his life from ending. His children state they want to honor his wishes. Requested patient to share his feelings. He discussed his feelings from yesterday, and he states he needs to think about it, he is not sure. His girlfriend then states she will honor his wishes, but quickly returned to discussing fighting further. Patient requests to talk with chaplain.   Stepped out for family to talk. Daughter Scott [name] steps out of room. She states she and her father have always been close, and that he has anxiety because he is worried and wondering if there is a God and what is after this life. She states she feels he is ready to go but has expressed fear.   She also  comments on her mother. She states the reason he wants the children to make decisions is because the girlfriend will want to keep going, and fight no matter what. Scott [name] tells me about her daughter who was born with a congenital abnormality and required many medical procedures and had pain and suffering. She states her mother (patient's girlfriend) pushed to do everything despite the child's suffering. She states her father would not want that, and did not state what he wanted  as "she guilt tripped him". She has a very good grasp of her father's status and has worked at a nursing home herself. She does not want her father to suffer.      Length of Stay: 6  Current Medications: Scheduled Meds:  . vitamin C  500 mg Oral BID  . aspirin EC  81 mg Oral Daily  . atorvastatin  40 mg Oral Daily  . calcium acetate  2,001 mg Oral TID WC  . Chlorhexidine Gluconate Cloth  6 each Topical Q0600  . collagenase   Topical Daily  . epoetin (EPOGEN/PROCRIT) injection  10,000 Units Intravenous Q T,Th,Sa-HD  . feeding supplement (NEPRO CARB STEADY)  237 mL Oral BID BM  . FLUoxetine  20 mg Oral Daily  . heparin  5,000 Units Subcutaneous Q8H  . midodrine  10 mg Oral TID WC  . multivitamin  1 tablet Oral QHS  . pantoprazole  40 mg Oral Daily  . sodium chloride flush  3 mL Intravenous Q12H    Continuous Infusions: . sodium chloride Stopped (01/13/20 0231)    PRN Meds: sodium chloride, acetaminophen **OR** acetaminophen, HYDROcodone-acetaminophen, HYDROcodone-acetaminophen, ondansetron **OR** ondansetron (ZOFRAN) IV, ondansetron (ZOFRAN) IV  Physical Exam Pulmonary:     Effort: Pulmonary effort is normal.  Neurological:     Mental Status: He is alert.             Vital Signs: BP (!) 93/39 (BP Location: Left Arm)   Pulse 67   Temp 97.8 F (36.6 C) (Oral)   Resp (!) 23   Ht 5\' 9"  (1.753 m)   Wt 108.4 kg   SpO2 100%   BMI 35.29 kg/m  SpO2: SpO2: 100 % O2 Device: O2 Device: Nasal Cannula O2 Flow  Rate: O2 Flow Rate (L/min): 4 L/min  Intake/output summary:   Intake/Output Summary (Last 24 hours) at 01/18/2020 1449 Last data filed at 01/18/2020 1250 Gross per 24 hour  Intake 352 ml  Output 2376 ml  Net -2024 ml   LBM: Last BM Date: 01/15/20 Baseline Weight: Weight: 102.1 kg Most recent weight: Weight: (107.8kg)       Palliative Assessment/Data:      Patient Active Problem List   Diagnosis Date Noted  . Pressure injury of skin 01/13/2020  . Acute respiratory failure with hypoxia ([place]) 01/12/2020  . ESRD on peritoneal dialysis ([place]) 01/12/2020  . Hyperlipidemia associated with type 2 diabetes mellitus ([place]) 01/12/2020  . Generalized (acute) peritonitis ([place]) 10/09/2019  . Generalized abdominal pain 10/02/2019  . Leg pain, right 07/31/2019  . PAD (peripheral artery disease) ([place]) 07/17/2019  . Osteomyelitis of second toe of left foot ([place]) 07/17/2019  . Type II diabetes mellitus ([place])   . PVD (peripheral vascular disease) ([place])   . Non-Q wave myocardial infarction ([place])   . Hypertension associated with diabetes ([place])   . Coronary artery disease   . Ulcerated, foot, right, with necrosis of muscle ([place])   . Atherosclerosis of artery of extremity with ulceration ([place]) 07/03/2019  . Anemia 04/15/2019  . Cellulitis 01/21/2019  . History of colonic polyps   . Positive fecal occult blood test   . 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 Atherosclerotic heart disease of native coronary artery without angina pectoris 06/02/2017  . Encounter for screening for depression 06/02/2017  . Gastro-esophageal reflux disease with esophagitis 06/02/2017  . Other asthma 06/02/2017  . Other specified arthritis, unspecified site 06/02/2017  . Acidosis 05/31/2017  .  Anemia in ESRD (end-stage renal disease) ([place]) 05/31/2017  . Hyperkalemia 05/31/2017  . Iron deficiency anemia, unspecified 05/31/2017  . Liver disease, unspecified  05/31/2017  . Moderate protein-calorie malnutrition ([place]) 05/31/2017  . Other dietary vitamin B12 deficiency anemia 05/31/2017  . Other disorders of electrolyte and fluid balance, not elsewhere classified 05/31/2017  . Other disorders resulting from impaired renal tubular function 05/31/2017  . Other hyperlipidemia 05/31/2017  . Other long term (current) drug therapy 05/31/2017  . Secondary hyperparathyroidism of renal origin ([place]) 05/31/2017  . Unspecified jaundice 05/31/2017  . Acute on chronic systolic CHF (congestive heart failure) ([place]) 01/17/2017  . ESRD (end stage renal disease) ([place]) 11/25/2016  . Tobacco abuse 11/25/2016  . Non-ST elevation (NSTEMI) myocardial infarction ([place]) 10/21/2016  . Pure hypercholesterolemia 10/21/2016  . Type 2 diabetes mellitus with other diabetic kidney complication ([place]) 16/06/9603  . GERD (gastroesophageal reflux disease) 04/04/2015  . Congestive heart failure ([place]) 12/13/2014    Palliative Care Assessment & Plan   Recommendations/Plan: Patient considering options.  Chaplain requested to talk with patient.     Code Status:    Code Status Orders  (From admission, onward)         Start     Ordered   01/12/20 2328  Full code  Continuous     01/12/20 2328        Code Status History    Date Active Date Inactive Code Status Order ID Comments User Context   07/31/2019 1021 08/13/2019 0200 Full Code [id number]  Damita Lack, MD ED   07/17/2019 1358 07/21/2019 2002 Full Code [id number]  Para Skeans, MD ED   05/22/2019 1830 05/26/2019 1657 Full Code [id number]  Demetrios Loll, MD Inpatient   09/18/2018 0022 09/19/2018 1608 Full Code [id number]  Mayo, Pete Pelt, MD ED   03/09/2018 1206 03/12/2018 1712 Full Code [id number]  Henreitta Leber, MD Inpatient   01/17/2017 0338 01/20/2017 1834 Full Code [id number]  Harrie Foreman, MD ED   11/13/2016 1013 11/20/2016 1256 Full Code [id number]  Hillary Bow, MD ED   01/09/2015 1019 01/10/2015 1518 Full Code  [id number]  Max Sane, MD Inpatient   12/18/2014 1620 12/19/2014 1627 Full Code [id number]  Charolette Forward, MD Inpatient   12/14/2014 1600 12/18/2014 1620 Full Code [id number]  Charolette Forward, MD Inpatient   12/13/2014 1324 12/14/2014 1600 Full Code [id number]  Charolette Forward, MD Inpatient   Advance Care Planning Activity      Prognosis:  Poor   Care plan was discussed with RN, MD  Thank you for allowing the Palliative Medicine Team to assist in the care of this patient.   Time In: 2:50 Time Out: 4:00 Total Time 70 min Prolonged Time Billed  yes      Greater than 50%  of this time was spent counseling and coordinating care related to the above assessment and plan.  Asencion Gowda, NP  Please contact Palliative Medicine Team phone at [phone] for questions and concerns.

## 2020-01-18 NOTE — Progress Notes (Deleted)
Pre HD     01/18/20 0910  Vital Signs  Temp (!) 97.3 F (36.3 C)  Temp Source Axillary  Pulse Rate 76  Pulse Rate Source Dinamap  Resp (!) 40  BP (!) 112/55  BP Location Left Arm  BP Method Automatic  Patient Position (if appropriate) Lying  Oxygen Therapy  SpO2 100 %  O2 Device Nasal Cannula  O2 Flow Rate (L/min) 4 L/min  Pain Assessment  Pain Scale 0-10  Pain Score 0  Dialysis Weight  Weight  (107.8kg)  Type of Weight Pre-Dialysis  Time-Out for Hemodialysis  What Procedure? HD  Pt Identifiers(min of two) First/Last Name;MRN/Account#;Pt's DOB(use if MRN/Acct# not available  Correct Site? Yes  Correct Side? Yes  Correct Procedure? Yes  Consents Verified? Yes  Rad Studies Available? N/A  Safety Precautions Reviewed? Yes  Education / Care Plan  Dialysis Education Provided Yes  Documented Education in Care Plan Yes  Outpatient Plan of Care Reviewed and on Chart Yes  Hemodialysis Catheter Right Femoral vein Triple lumen Temporary (Non-Tunneled)  Placement Date/Time: 01/13/20 1438   Time Out: Correct patient;Correct site;Correct procedure  Maximum sterile barrier precautions: Hand hygiene;Cap;Mask;Sterile gown;Sterile gloves;Large sterile sheet  Site Prep: Chlorhexidine (preferred)  Local Anes...  Site Condition No complications  Blue Lumen Status Blood return noted  Red Lumen Status Blood return noted  Purple Lumen Status N/A  Dressing Type Occlusive  Dressing Status Clean;Dry;Intact  Interventions  (none)  Drainage Description None  Dressing Change Due 01/22/20

## 2020-01-18 NOTE — Progress Notes (Addendum)
Scott [name] visited pt. per request from palliative care NP.  NP shared pt. is dealing with spiritual turmoil re: what happens after death; has expressed desire to speak w/chaplain.  Pt. lying in bed when Scott [name] entered rm. w/dtr. @ bedside.  Scott [name] sat and began to inquire about pt.'s condition; pt. said he had decided to 'let go' but then changed his mind and wants to fight; 'Is that wrong, do you think? he asked Scott [name].  Scott [name] replied that if pt. is fighting because he and the medical team think there's a chance he might be able to recover then fighting might be good; on the other hand fighting solely from fear of death is not as good --> Scott [name] suggested that the pt.s he has seen die well all died peacefully, not in fear.  Pt. said he is worried because he doesn't know what happens after this life; is not sure that God exists.  Scott [name] said even very devoted people of faith are not always sure about God; that belief in God involves a leap of faith.  Scott [name] shared, w/pt.'s permission, about his own Scott [name] and his conviction that God is a God of love Who desires relationship with people.  After some discussion of Scott [name]'s own journey to faith and about the basic beliefs within Christianity, Scott [name] asked if any of what had been said made any sense; pt. said it did; "Can you introduce me to Him?" pt. asked.  Scott [name] led Pt. in prayer of confession of sin and acceptance of God's forgiveness.  Scott [name] provided pt. a New Testament and directed him to some passages to start reading; Scott [name] also suggested pt. tell his family he had become a Scott [name].  Pt. asked Scott [name] to visit w/him a little longer and Scott [name] further described who Scott [name] has found God to be (loving, forgiving, present in struggles and suffering, eager to hear our prayers)  At length pt needed to use bathroom.  Scott [name] excused himself but plans to follow up Saturday.  No further needs at this time.

## 2020-01-18 NOTE — Progress Notes (Signed)
Progress Note    Scott [name]  RJJ:[id number] DOB: 17-Apr-1961  DOA: 01/12/2020 PCP: [facility]      Brief Narrative:    Medical records reviewed and are as summarized below:  Scott [name] is an 59 y.o. male with medical history significant forESRD on peritoneal dialysis, chronic systolic CHF (EF 01-60% by TTE 09/18/2018), CAD, insulin-dependent type 2 diabetes, anemia of chronic disease, hypertension, hyperlipidemia, peripheral vascular disease whois admitted with acute respiratory failure with hypoxia due to acute on chronic systolic CHF in setting of ESRD on PD.       Assessment/Plan:   Principal Problem:   Acute respiratory failure with hypoxia (HCC) Active Problems:   Type 2 diabetes mellitus with other diabetic kidney complication (HCC)   Congestive heart failure (HCC)   Acute on chronic systolic CHF (congestive heart failure) (HCC)   Anemia in ESRD (end-stage renal disease) (HCC)   PVD (peripheral vascular disease) ([place])   Hypertension associated with diabetes ([place])   Coronary artery disease   ESRD on peritoneal dialysis ([facility])   Hyperlipidemia associated with type 2 diabetes mellitus ([place])   Pressure injury of skin   Acute respiratory failure with hypoxia due to acute on chronic systolic CHF in setting of ESRD on peritoneal dialysis: Requiring 4 L oxygen via nasal cannula - CT Angio to r/o PE - ordered 5/18 2D echo showed EF estimated at 25 to 30%.  Appreciate assistance from cardiologist.   End Stage Renal Disease:  No dialysis.  Status post temporary dialysis catheter placement by vascular surgery on 5/15 and 1 session of hemo dialysis -s/p tunneled dialysis catheter on 5/17.  Informed nursing staff to remove temporary femoral catheter  Follow-up with nephrologist for hemodialysis.  Persistent hypotension Continue midodrine. Echo shows severe global hypokinesis. EF 25-30% Tachycardia has  improved.  CAD/hyperlipidemia Chest pain.  Mildly elevated troponin likely from demand ischemia.   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  Vascular surgeon said amputation is the only next option.  Continue local wound care. Continue aspirin and statin   Anemia of chronic kidney disease: stable withoutobvious bleeding.  EPO per nephrology at dialysis  Insulin-dependent type 2 diabetes: - sensitive SSI while in hospital and adjust as needed.  Nonhealing vascular wounds to bilateral feet. 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 Remaining toes are necrotic and nonintact on plantar surface - Present on admission - appreciate WOC nurse eval and input - Wound type:Vascualr wounds and pressure/moisture to buttocks. - Measurement:sacrum: 3 cm x 2.4 cm slough to wound bed - dressing changes per [facility] team  Weakness Physical therapist has been consulted.  Depression with suicidal ideation She has been evaluated by the psychiatrist.  Continue 1:1 sitter and suicide precautions until cleared by psychiatrist.  Right arm swelling No DVT on venous duplex    Body mass index is 35.29 kg/m.  (Morbid obesity)   Plan of care was discussed with his girlfriend and his daughter at the bedside.  His girlfriend said that patient has never been the same since he had a second shot of Covid 19 vaccine on Jan 04, 2020.  She attributed his current problems including shortness of breath, hypoxemia and generalized body swelling to COVID-19 vaccine.  She said she believes that patient would do well if he was treated with NSAIDs and steroids.  I explained that currently, there is no indication for NSAIDs and steroids in his condition.  She  said she was not ready to give up on the patient.  She said she wanted to talk to the cardiologist and nephrologist about her opinion.  Of note, patient has expressed interest in comfort measures with  hospice.  Follow-up with palliative care.    Pressure Injury 01/12/20 Coccyx Stage 2 -  Partial thickness loss of dermis presenting as a shallow open injury with a red, pink wound bed without slough. stage 2 pressure ulcer  (Active)  01/12/20 2330  Location: Coccyx  Location Orientation:   Staging: Stage 2 -  Partial thickness loss of dermis presenting as a shallow open injury with a red, pink wound bed without slough.  Wound Description (Comments): stage 2 pressure ulcer   Present on Admission: Yes     Pressure Injury 01/12/20 Heel (Active)  01/12/20 2330  Location: Heel  Location Orientation:   Staging:   Wound Description (Comments):   Present on Admission:          Family Communication/Anticipated D/C date and plan/Code Status   DVT prophylaxis: Heparin Code Status: Full code Family Communication: Plan of care was discussed with his girlfriend and his daughter at the bedside. Disposition Plan:    Status is: Inpatient  Remains inpatient appropriate because:Inpatient level of care appropriate due to severity of illness   Dispo: The patient is from: Home              Anticipated d/c is to: Home              Anticipated d/c date is: 3 days              Patient currently is not medically stable to d/c.            Subjective:   Overnight events noted.  Rapid response was called last night because of shortness of breath and hypoxemia.  He still feels a little short of breath.  He still has significant swelling of bilateral legs.  He said he is no longer suicidal.  Objective:    Vitals:   01/18/20 1230 01/18/20 1245 01/18/20 1250 01/18/20 1313  BP: (!) 101/55 (!) 88/46 (!) 100/32 (!) 93/39  Pulse: (!) 31 75  67  Resp: (!) 28 (!) 25 (!) 32 (!) 23  Temp:  (!) 97.3 F (36.3 C)  97.8 F (36.6 C)  TempSrc:  Axillary  Oral  SpO2: 100% 99% 99% 100%  Weight:      Height:       No data found.   Intake/Output Summary (Last 24 hours) at 01/18/2020  1659 Last data filed at 01/18/2020 1250 Gross per 24 hour  Intake 352 ml  Output 2005 ml  Net -1653 ml   Filed Weights   01/15/20 0500 01/15/20 1500 01/17/20 0432  Weight: 110.5 kg 110.5 kg 108.4 kg    Exam:  GEN: NAD SKIN: Multiple excoriations on the back, bruises on chest.  Stage II coccygeal decubitus ulcer and unstageable right heel decubitus ulcer (present on admission) EYES: EOMI ENT: MMM CV: RRR PULM: CTA B ABD: soft, distended, NT, +BS CNS: AAO x 3, non focal EXT: significant b/l lower extremity edema from thighs to feet, no tenderness.  Right upper extremity swelling/edema.  Dry gangrene of bilateral feet (right foot and fifth and left third toes) PSYCH: Depressed   Data Reviewed:   I have personally reviewed following labs and imaging studies:  Labs: Labs show the following:   Basic Metabolic Panel: Recent Labs  Lab 01/13/20 0028  01/13/20 0028 01/14/20 0559 01/14/20 0559 01/16/20 0401 01/16/20 0401 01/17/20 0630 01/18/20 1039  NA 136  --  140  --  139  --  138 139  K 3.7   < > 3.7   < > 3.7   < > 3.4* 3.2*  CL 102  --  103  --  100  --  98 98  CO2 22  --  25  --  28  --  28 29  GLUCOSE 124*  --  88  --  114*  --  109* 97  BUN 52*  --  49*  --  41*  --  32* 24*  CREATININE 6.63*  --  6.06*  --  6.05*  --  4.85* 3.74*  CALCIUM 7.2*  --  7.2*  --  7.0*  --  7.4* 7.8*  PHOS  --   --   --   --   --   --   --  3.5   < > = values in this interval not displayed.   GFR Estimated Creatinine Clearance: 25.2 mL/min (A) (by C-G formula based on SCr of 3.74 mg/dL (H)). Liver Function Tests: Recent Labs  Lab 01/12/20 2119 01/18/20 1039  AST 18  --   ALT 13  --   ALKPHOS 122  --   BILITOT 0.8  --   PROT 6.2*  --   ALBUMIN 2.4* 3.0*   No results for input(s): LIPASE, AMYLASE in the last 168 hours. No results for input(s): AMMONIA in the last 168 hours. Coagulation profile No results for input(s): INR, PROTIME in the last 168 hours.  CBC: Recent Labs   Lab 01/12/20 2119 01/12/20 2119 01/13/20 0028 01/14/20 0559 01/16/20 0401 01/17/20 0630 01/18/20 1039  WBC 10.4   < > 8.5 7.0 9.5 10.2 11.2*  NEUTROABS 9.2*  --   --   --   --   --   --   HGB 9.3*   < > 8.6* 8.2* 8.5* 8.2* 8.0*  HCT 29.9*   < > 27.6* 26.9* 27.9* 26.2* 26.5*  MCV 96.1   < > 97.2 98.5 99.3 97.8 98.5  PLT 288   < > 268 237 245 219 214   < > = values in this interval not displayed.   Cardiac Enzymes: No results for input(s): CKTOTAL, CKMB, CKMBINDEX, TROPONINI in the last 168 hours. BNP (last 3 results) No results for input(s): PROBNP in the last 8760 hours. CBG: Recent Labs  Lab 01/17/20 2012  GLUCAP 96   D-Dimer: No results for input(s): DDIMER in the last 72 hours. Hgb A1c: No results for input(s): HGBA1C in the last 72 hours. Lipid Profile: No results for input(s): CHOL, HDL, LDLCALC, TRIG, CHOLHDL, LDLDIRECT in the last 72 hours. Thyroid function studies: No results for input(s): TSH, T4TOTAL, T3FREE, THYROIDAB in the last 72 hours.  Invalid input(s): FREET3 Anemia work up: No results for input(s): VITAMINB12, FOLATE, FERRITIN, TIBC, IRON, RETICCTPCT in the last 72 hours. Sepsis Labs: Recent Labs  Lab 01/14/20 0559 01/16/20 0401 01/17/20 0630 01/18/20 1039  WBC 7.0 9.5 10.2 11.2*    Microbiology Recent Results (from the past 240 hour(s))  SARS Coronavirus 2 by RT PCR (hospital order, performed in [facility] hospital lab) Nasopharyngeal Nasopharyngeal Swab     Status: None   Collection Time: 01/12/20  9:08 PM   Specimen: Nasopharyngeal Swab  Result Value Ref Range Status   SARS Coronavirus 2 NEGATIVE NEGATIVE Final    Comment: (  NOTE) SARS-CoV-2 target nucleic acids are NOT DETECTED. The SARS-CoV-2 RNA is generally detectable in upper and lower respiratory specimens during the acute phase of infection. The lowest concentration of SARS-CoV-2 viral copies this assay can detect is 250 copies / mL. A negative result does not preclude  SARS-CoV-2 infection and should not be used as the sole basis for treatment or other patient management decisions.  A negative result may occur with improper specimen collection / handling, submission of specimen other than nasopharyngeal swab, presence of viral mutation(s) within the areas targeted by this assay, and inadequate number of viral copies (<250 copies / mL). A negative result must be combined with clinical observations, patient history, and epidemiological information. Fact Sheet for Patients:   StrictlyIdeas.no Fact Sheet for Healthcare Providers: BankingDealers.co.za This test is not yet approved or cleared  by the [place] FDA and has been authorized for detection and/or diagnosis of SARS-CoV-2 by FDA under an Emergency Use Authorization (EUA).  This EUA will remain in effect (meaning this test can be used) for the duration of the COVID-19 declaration under Section 564(b)(1) of the Act, 21 U.S.C. section 360bbb-3(b)(1), unless the authorization is terminated or revoked sooner. Performed at [facility], [address] [place], [place] [address]   Body fluid culture     Status: None   Collection Time: 01/13/20 10:45 AM   Specimen: Peritoneal Washings; Body Fluid  Result Value Ref Range Status   Specimen Description   Final    PERITONEAL Performed at [facility], [address], [place], [place] [address]    Special Requests   Final    NONE Performed at [facility], [place], [place] [address]    Gram Stain NO WBC SEEN NO ORGANISMS SEEN   Final   Culture   Final    NO GROWTH 3 DAYS Performed at [facility], [place] [address], [place], [place] [address]    Report Status 01/17/2020 FINAL  Final  Urine Culture     Status: None   Collection Time: 01/14/20  3:30 AM   Specimen: Urine, Random  Result Value Ref Range Status   Specimen Description   Final     URINE, RANDOM Performed at [facility], [address], [place], [place] [address]    Special Requests   Final    NONE Performed at [facility], [address], [place], [place] [address]    Culture   Final    NO GROWTH Performed at [facility], [place] [address] [place], [place] [address]    Report Status 01/15/2020 FINAL  Final    Procedures and diagnostic studies:  CT ANGIO CHEST PE W OR WO CONTRAST  Result Date: 01/16/2020 CLINICAL DATA:  Shortness of breath.  Hypoxia. EXAM: CT ANGIOGRAPHY CHEST WITH CONTRAST TECHNIQUE: Multidetector CT imaging of the chest was performed using the standard protocol during bolus administration of intravenous contrast. Multiplanar CT image reconstructions and MIPs were obtained to evaluate the vascular anatomy. CONTRAST:  12mL OMNIPAQUE IOHEXOL 350 MG/ML SOLN COMPARISON:  None recent FINDINGS: Cardiovascular: Contrast injection is sufficient to demonstrate satisfactory opacification of the pulmonary arteries to the segmental level. There is no pulmonary embolus. The main pulmonary artery is within normal limits for size. There is no CT evidence of acute right heart strain. There are atherosclerotic changes of the thoracic aorta. The ascending thoracic aorta is aneurysmal measuring at least 4 cm in diameter. Heart size is significantly  enlarged. There are advanced coronary artery calcifications. There is a dialysis catheter terminating at cavoatrial junction. There is reflux of contrast into the IVC. Mediastinum/Nodes: --No mediastinal or hilar lymphadenopathy. --No axillary lymphadenopathy. --No supraclavicular lymphadenopathy. --Normal thyroid gland. --The esophagus is unremarkable Lungs/Pleura: There is a large right-sided pleural effusion. Evaluation of the lung fields is limited by respiratory motion artifact. There appears to be some bilateral interlobular septal thickening with ground-glass airspace opacities suggestive of  underlying pulmonary edema. There is significant compressive atelectasis involving the right lower lobe. Upper Abdomen: The liver appears to demonstrate a borderline nodular contour. Musculoskeletal: There are mild degenerative changes throughout the visualized thoracolumbar spine without evidence for an acute displaced fracture. There is subcutaneous edema involving the patient's anterior chest wall on the right, felt to be related to dialysis catheter placement. Review of the MIP images confirms the above findings. IMPRESSION: 1. No acute pulmonary embolism. 2. Cardiomegaly with findings could consistent with congestive heart failure including a large right-sided pleural effusion. 3. Questionable cirrhosis. 4. Ascending thoracic aortic aneurysm measuring approximately 4 cm. Recommend annual imaging followup by CTA or MRA. This recommendation follows 2010 ACCF/AHA/AATS/ACR/ASA/SCA/SCAI/SIR/STS/SVM Guidelines for the Diagnosis and Management of Patients with Thoracic Aortic Disease. Circulation. 2010; 121: C585-I778. Aortic aneurysm NOS (ICD10-I71.9) Aortic Atherosclerosis (ICD10-I70.0). Electronically Signed   By: Constance Holster M.D.   On: 01/16/2020 22:11   US Venous Img Upper Uni Right(DVT)  Result Date: 01/17/2020 CLINICAL DATA:  Right upper extremity edema. History of central line placement. Evaluate for DVT. EXAM: RIGHT UPPER EXTREMITY VENOUS DOPPLER ULTRASOUND TECHNIQUE: Gray-scale sonography with graded compression, as well as color Doppler and duplex ultrasound were performed to evaluate the upper extremity deep venous system from the level of the subclavian vein and including the jugular, axillary, basilic, radial, ulnar and upper cephalic vein. Spectral Doppler was utilized to evaluate flow at rest and with distal augmentation maneuvers. COMPARISON:  None. FINDINGS: Contralateral Subclavian Vein: Respiratory phasicity is normal and symmetric with the symptomatic side. No evidence of thrombus.  Normal compressibility. Internal Jugular Vein: No evidence of thrombus. Normal compressibility, respiratory phasicity and response to augmentation. Subclavian Vein: No evidence of thrombus. Normal compressibility, respiratory phasicity and response to augmentation. Axillary Vein: No evidence of thrombus. Normal compressibility, respiratory phasicity and response to augmentation. Cephalic Vein: No evidence of thrombus. Normal compressibility, respiratory phasicity and response to augmentation. Basilic Vein: No evidence of thrombus. Normal compressibility, respiratory phasicity and response to augmentation. Brachial Veins: No evidence of thrombus within either of the paired brachial veins. Normal compressibility, respiratory phasicity and response to augmentation. Radial Veins: No evidence of thrombus. Normal compressibility, respiratory phasicity and response to augmentation. Ulnar Veins: No evidence of thrombus. Normal compressibility, respiratory phasicity and response to augmentation. Venous Reflux:  None visualized. Other Findings: There is a moderate amount of subcutaneous edema at the level of the right upper arm (images 12 through 14) IMPRESSION: No evidence of DVT within the right upper extremity. Electronically Signed   By: Sandi Mariscal M.D.   On: 01/17/2020 16:36   DG Chest Port 1 View  Result Date: 01/18/2020 CLINICAL DATA:  Acute respiratory distress EXAM: PORTABLE CHEST 1 VIEW COMPARISON:  01/12/2020 FINDINGS: Cardiomegaly. Dialysis catheter on the right with tip at the upper cavoatrial junction. Interstitial opacity and moderate right pleural effusion. No pneumothorax. IMPRESSION: CHF pattern similar to 6 days ago. Unchanged right pleural effusion which was moderate by recent CT. Electronically Signed   By: Monte Fantasia M.D.   On: 01/18/2020 05:14  Medications:   . vitamin C  500 mg Oral BID  . aspirin EC  81 mg Oral Daily  . atorvastatin  40 mg Oral Daily  . calcium acetate  2,001 mg  Oral TID WC  . Chlorhexidine Gluconate Cloth  6 each Topical Q0600  . collagenase   Topical Daily  . epoetin (EPOGEN/PROCRIT) injection  10,000 Units Intravenous Q T,Th,Sa-HD  . feeding supplement (NEPRO CARB STEADY)  237 mL Oral BID BM  . FLUoxetine  20 mg Oral Daily  . heparin  5,000 Units Subcutaneous Q8H  . midodrine  10 mg Oral TID WC  . multivitamin  1 tablet Oral QHS  . pantoprazole  40 mg Oral Daily  . sodium chloride flush  3 mL Intravenous Q12H   Continuous Infusions: . sodium chloride Stopped (01/13/20 0231)     LOS: 6 days   Treylen Gibbs  [facility]     01/18/2020, 4:59 PM

## 2020-01-18 NOTE — Progress Notes (Signed)
Progress Note  Patient Name: Scott [name] Date of Encounter: 01/18/2020  Primary Cardiologist: Agbor-Etang  Subjective   He would like to continue with dialysis at this time.  Peritoneal dialysis performed on 5/19 with plans for hemodialysis today.  He remains significantly volume up.  He denies chest pain.  Shortness of breath stable.  Inpatient Medications    Scheduled Meds: . vitamin C  500 mg Oral BID  . aspirin EC  81 mg Oral Daily  . atorvastatin  40 mg Oral Daily  . calcium acetate  2,001 mg Oral TID WC  . Chlorhexidine Gluconate Cloth  6 each Topical Q0600  . collagenase   Topical Daily  . epoetin (EPOGEN/PROCRIT) injection  10,000 Units Intravenous Q M,W,F-HD  . feeding supplement (NEPRO CARB STEADY)  237 mL Oral BID BM  . gentamicin cream  1 application Topical Daily  . heparin  5,000 Units Subcutaneous Q8H  . midodrine  10 mg Oral TID WC  . multivitamin  1 tablet Oral QHS  . pantoprazole  40 mg Oral Daily  . sodium chloride flush  3 mL Intravenous Q12H   Continuous Infusions: . sodium chloride Stopped (01/13/20 0231)  . dialysis solution 4.25% low-MG/low-CA     PRN Meds: sodium chloride, acetaminophen **OR** acetaminophen, HYDROcodone-acetaminophen, HYDROcodone-acetaminophen, ondansetron **OR** ondansetron (ZOFRAN) IV, ondansetron (ZOFRAN) IV   Vital Signs    Vitals:   01/18/20 0159 01/18/20 0436 01/18/20 0601 01/18/20 0754  BP: (!) 144/52 (!) 111/95 (!) 157/139 (!) 108/58  Pulse: 80 81 80 80  Resp: 20 20 20  (!) 22  Temp: 98.2 F (36.8 C) 97.6 F (36.4 C) 97.6 F (36.4 C) 97.7 F (36.5 C)  TempSrc:      SpO2: 94% (!) 80% 100% 99%  Weight:      Height:        Intake/Output Summary (Last 24 hours) at 01/18/2020 0827 Last data filed at 01/17/2020 1700 Gross per 24 hour  Intake 224 ml  Output 371 ml  Net -147 ml   Filed Weights   01/15/20 0500 01/15/20 1500 01/17/20 0432  Weight: 110.5 kg 110.5 kg 108.4 kg    Telemetry    SR-  Personally Reviewed  ECG    No new tracings - Personally Reviewed  Physical Exam   GEN: No acute distress.   Neck: JVD elevated to daily mandible. Cardiac: RRR, no murmurs, rubs, or gallops.  Respiratory:  Bibasilar crackles.  GI: Soft, nontender, distended.   MS:  2+ bilateral pitting lower extremity edema; No deformity. Neuro:  Alert and oriented x 3; Nonfocal.  Psych: Normal affect.  Labs    Chemistry Recent Labs  Lab 01/12/20 2119 01/13/20 0009 01/14/20 0559 01/16/20 0401 01/17/20 0630  NA 136   < > 140 139 138  K 4.1   < > 3.7 3.7 3.4*  CL 102   < > 103 100 98  CO2 22   < > 25 28 28   GLUCOSE 146*   < > 88 114* 109*  BUN 52*   < > 49* 41* 32*  CREATININE 6.60*   < > 6.06* 6.05* 4.85*  CALCIUM 7.4*   < > 7.2* 7.0* 7.4*  PROT 6.2*  --   --   --   --   ALBUMIN 2.4*  --   --   --   --   AST 18  --   --   --   --   ALT 13  --   --   --   --  ALKPHOS 122  --   --   --   --   BILITOT 0.8  --   --   --   --   GFRNONAA 8*   < > 9* 9* 12*  GFRAA 10*   < > 11* 11* 14*  ANIONGAP 12   < > 12 11 12    < > = values in this interval not displayed.     Hematology Recent Labs  Lab 01/14/20 0559 01/16/20 0401 01/17/20 0630  WBC 7.0 9.5 10.2  RBC 2.73* 2.81* 2.68*  HGB 8.2* 8.5* 8.2*  HCT 26.9* 27.9* 26.2*  MCV 98.5 99.3 97.8  MCH 30.0 30.2 30.6  MCHC 30.5 30.5 31.3  RDW 16.9* 16.7* 16.9*  PLT 237 245 219    Cardiac EnzymesNo results for input(s): TROPONINI in the last 168 hours. No results for input(s): TROPIPOC in the last 168 hours.   BNP Recent Labs  Lab 01/12/20 2119  BNP 3,262.0*     DDimer No results for input(s): DDIMER in the last 168 hours.   Radiology    CT ANGIO CHEST PE W OR WO CONTRAST  Result Date: 01/16/2020 IMPRESSION: 1. No acute pulmonary embolism. 2. Cardiomegaly with findings could consistent with congestive heart failure including a large right-sided pleural effusion. 3. Questionable cirrhosis. 4. Ascending thoracic aortic aneurysm  measuring approximately 4 cm. Recommend annual imaging followup by CTA or MRA. This recommendation follows 2010 ACCF/AHA/AATS/ACR/ASA/SCA/SCAI/SIR/STS/SVM Guidelines for the Diagnosis and Management of Patients with Thoracic Aortic Disease. Circulation. 2010; 121: I458-K998. Aortic aneurysm NOS (ICD10-I71.9) Aortic Atherosclerosis (ICD10-I70.0). Electronically Signed   By: Constance Holster M.D.   On: 01/16/2020 22:11   US Venous Img Upper Uni Right(DVT)  Result Date: 01/17/2020 IMPRESSION: No evidence of DVT within the right upper extremity. Electronically Signed   By: Sandi Mariscal M.D.   On: 01/17/2020 16:36   DG Chest Port 1 View  Result Date: 01/18/2020 IMPRESSION: CHF pattern similar to 6 days ago. Unchanged right pleural effusion which was moderate by recent CT. Electronically Signed   By: Monte Fantasia M.D.   On: 01/18/2020 05:14    Cardiac Studies   2D echo 01/16/2020: 1. Left ventricular ejection fraction, by estimation, is 25 to 30%. The  left ventricle has severely decreased function. The left ventricle  demonstrates global hypokinesis. The left ventricular internal cavity size  was moderately dilated. There is mild  left ventricular hypertrophy. Left ventricular diastolic parameters are  consistent with Grade II diastolic dysfunction (pseudonormalization).  Elevated left atrial pressure.  2. Pulmonary artery pressure is at least mildly elevated (PASP 30-35 mmHg  plus central venous pressure). Right ventricular systolic function is  moderately reduced. The right ventricular size is mildly enlarged.  3. Left atrial size was mildly dilated.  4. Right atrial size was moderately dilated.  5. The mitral valve is normal in structure. Trivial mitral valve  regurgitation. No evidence of mitral stenosis.  6. The aortic valve is tricuspid. Aortic valve regurgitation is not  visualized. Mild aortic valve sclerosis is present, with no evidence of  aortic valve stenosis.  7.  Mildly dilated pulmonary artery.   Comparison(s): A prior study was performed on 09/18/2018. LVEF has  decreased from 35-40% to 25-30%.   Patient Profile     59 y.o. male with history of CAD s/p PCI to LAD x 3, OM2, and PDA, HFrEF secondary ICM, pulmonary hypertension, ascending aortic aneurysm, ESRD on PD, anemia of chronic disease, diabetes, and HTN who we are  seeing for evaluation of acute on chronic HFrEF.  Assessment & Plan    1.  Acute on chronic HFrEF/pulmonary pretension: -Felt to be secondary to failed peritoneal dialysis -Volume managed by dialysis with plans to transition to hemodialysis today -Midodrine for BP support as needed -Lasix of low yield and has been discontinued -GDMT on hold secondary to relative hypotension and need for dialysis -Escalate evidence-based therapy as vital signs allow moving forward -Daily weights -CHF education  2.  CAD involving the native coronary arteries without angina: -Last Myoview in 07/2019 without evidence of ischemia -Continue ASA and statin -No plans for inpatient ischemic evaluation at this time in the setting of his acute illness  3.  ESRD: -Per nephrology  4.  Anemia of chronic disease: -Epogen with dialysis -Stable  5.  Ascending aortic aneurysm: -Stable on most recent imaging -Follow-up as outpatient  6.  Hypokalemia: -To be managed with dialysis  7.  Hypoalbuminemia: -Possibly contributing to some degree of third spacing -IV albumin per nephrology  8.  PVD: -Nonhealing vascular wounds along the bilateral feet and history of toe amputations on the bilateral feet -Management per primary service  For questions or updates, please contact [place] Please consult [email or web address] for contact info under Cardiology/STEMI.    Signed, Christell Faith, PA-C [place] Pager: [phone] 01/18/2020, 8:27 AM

## 2020-01-18 NOTE — Significant Event (Signed)
Rapid Response Event Note  Overview: Time Called: 8592 Arrival Time: 0445 Event Type: Respiratory  Initial Focused Assessment: RR called for pt with drop in O2 sats. Placed on NRB by bedside RN prior to arrival. Pt alert, appropriately responsive, and does not appear to be in respiratory distress.  Interventions:  VBG and Chest xray ordered.  Sharion Settler, NP at bedside to assess pt.    Plan of Care (if not transferred): patient switched back to [facility] after a few mins on NRB maintaining O2 at 98-100%. Patient is scheduled for dialysis today for volume overload; RN to call dialysis nurse to see if they can dialyze patient as early as possible. Call if further assistance is needed.  Event Summary: Name of Physician Notified: Sharion Settler, NP at [phone]    at    Outcome: Stayed in room and stabalized  Event End Time: 4320  Scott [name]

## 2020-01-18 NOTE — Progress Notes (Signed)
HD Completed     01/18/20 1245  Vital Signs  Temp (!) 97.3 F (36.3 C)  Temp Source Axillary  Pulse Rate 75  Pulse Rate Source Dinamap  Resp (!) 25  BP (!) 88/46  BP Location Left Arm  BP Method Automatic  Patient Position (if appropriate) Lying  Oxygen Therapy  SpO2 99 %  O2 Device Nasal Cannula  O2 Flow Rate (L/min) 4 L/min  During Hemodialysis Assessment  HD Safety Checks Performed Yes  KECN 65.4 KECN  Dialysis Fluid Bolus Normal Saline  Bolus Amount (mL) 250 mL  Intra-Hemodialysis Comments Tx completed

## 2020-01-18 NOTE — Progress Notes (Signed)
HD Initiated     01/18/20 0912  Vital Signs  Temp (!) 97.3 F (36.3 C)  Temp Source Axillary  Pulse Rate 76  Pulse Rate Source Dinamap  Resp (!) 23  BP (!) 112/55  BP Location Left Arm  BP Method Automatic  Patient Position (if appropriate) Lying  Oxygen Therapy  SpO2 100 %  O2 Device Nasal Cannula  O2 Flow Rate (L/min) 4 L/min  Pain Assessment  Pain Scale 0-10  Pain Score 0  During Hemodialysis Assessment  Blood Flow Rate (mL/min) 200 mL/min  Arterial Pressure (mmHg) -40 mmHg  Venous Pressure (mmHg) 40 mmHg  Transmembrane Pressure (mmHg) 80 mmHg  Ultrafiltration Rate (mL/min) 1000 mL/min  Dialysate Flow Rate (mL/min) 600 ml/min  Conductivity: Machine  13.8  HD Safety Checks Performed Yes  Dialysis Fluid Bolus Normal Saline  Bolus Amount (mL) 250 mL  Intra-Hemodialysis Comments Tx initiated  Hemodialysis Catheter Right Internal jugular Double lumen Permanent (Tunneled)  Placement Date/Time: 01/15/20 1632   Placed prior to admission: Yes  Time Out: Correct patient;Correct site;Correct procedure  Maximum sterile barrier precautions: Hand hygiene;Cap;Mask;Sterile gown;Sterile gloves;Large sterile sheet  Site Prep: Chlor...  Site Condition No complications  Blue Lumen Status Blood return noted  Red Lumen Status Blood return noted  Purple Lumen Status N/A  Dressing Type Occlusive  Dressing Status Clean;Dry;Intact  Interventions  (none)  Drainage Description None  Dressing Change Due 01/22/20

## 2020-01-19 DIAGNOSIS — D631 Anemia in chronic kidney disease: Secondary | ICD-10-CM

## 2020-01-19 DIAGNOSIS — Z992 Dependence on renal dialysis: Secondary | ICD-10-CM

## 2020-01-19 DIAGNOSIS — J810 Acute pulmonary edema: Secondary | ICD-10-CM

## 2020-01-19 DIAGNOSIS — N186 End stage renal disease: Secondary | ICD-10-CM

## 2020-01-19 DIAGNOSIS — R0603 Acute respiratory distress: Secondary | ICD-10-CM

## 2020-01-19 LAB — CBC
HCT: 30.7 % — ABNORMAL LOW (ref 39.0–52.0)
Hemoglobin: 9.2 g/dL — ABNORMAL LOW (ref 13.0–17.0)
MCH: 30.3 pg (ref 26.0–34.0)
MCHC: 30 g/dL (ref 30.0–36.0)
MCV: 101 fL — ABNORMAL HIGH (ref 80.0–100.0)
Platelets: 268 10*3/uL (ref 150–400)
RBC: 3.04 MIL/uL — ABNORMAL LOW (ref 4.22–5.81)
RDW: 16.9 % — ABNORMAL HIGH (ref 11.5–15.5)
WBC: 12.8 10*3/uL — ABNORMAL HIGH (ref 4.0–10.5)
nRBC: 0 % (ref 0.0–0.2)

## 2020-01-19 LAB — RENAL FUNCTION PANEL
Albumin: 3 g/dL — ABNORMAL LOW (ref 3.5–5.0)
Anion gap: 12 (ref 5–15)
BUN: 26 mg/dL — ABNORMAL HIGH (ref 8–23)
CO2: 30 mmol/L (ref 22–32)
Calcium: 7.9 mg/dL — ABNORMAL LOW (ref 8.9–10.3)
Chloride: 96 mmol/L — ABNORMAL LOW (ref 98–111)
Creatinine, Ser: 4.33 mg/dL — ABNORMAL HIGH (ref 0.61–1.24)
GFR calc Af Amer: 16 mL/min — ABNORMAL LOW (ref >60)
GFR calc non Af Amer: 14 mL/min — ABNORMAL LOW (ref >60)
Glucose, Bld: 121 mg/dL — ABNORMAL HIGH (ref 70–99)
Phosphorus: 4 mg/dL (ref 2.5–4.6)
Potassium: 3.8 mmol/L (ref 3.5–5.1)
Sodium: 138 mmol/L (ref 135–145)

## 2020-01-19 MED ORDER — MORPHINE SULFATE (PF) 2 MG/ML IV SOLN
1.0000 mg | Freq: Once | INTRAVENOUS | Status: AC
  Administered 2020-01-19: 1 mg via INTRAVENOUS
  Filled 2020-01-19: qty 1

## 2020-01-19 MED ORDER — HYDROCODONE-ACETAMINOPHEN 7.5-325 MG PO TABS
1.0000 | ORAL_TABLET | Freq: Four times a day (QID) | ORAL | Status: DC | PRN
  Administered 2020-01-19 – 2020-01-20 (×3): 1 via ORAL
  Filled 2020-01-19 (×3): qty 1

## 2020-01-19 MED ORDER — ALUM & MAG HYDROXIDE-SIMETH 200-200-20 MG/5ML PO SUSP
15.0000 mL | ORAL | Status: DC | PRN
  Administered 2020-01-19: 15 mL via ORAL
  Filled 2020-01-19: qty 30

## 2020-01-19 MED ORDER — DIPHENHYDRAMINE HCL 50 MG/ML IJ SOLN
12.5000 mg | Freq: Four times a day (QID) | INTRAMUSCULAR | Status: DC | PRN
  Administered 2020-01-19: 12.5 mg via INTRAVENOUS
  Filled 2020-01-19: qty 1

## 2020-01-19 NOTE — Progress Notes (Signed)
Palliative Medicine RN Note: Rec'd a call from pt's daughter Scott [name]. She reports PMT NP Crystal was due to change the visitor to her and her mom, as her brother has left to go back to [facility]. Patient is requiring more family support as he struggles with decision making, and he needs to have family at his bedside.   I changed the visitor list per discussions with PMT provider to ensure proper and appropriate, compassionate care for Mr Dengel.  Scott Skiff Adel Neyer, RN, BSN, [facility] Palliative Medicine Team 01/19/2020 2:48 PM Office [phone]

## 2020-01-19 NOTE — Progress Notes (Signed)
This note also relates to the following rows which could not be included: Pulse Rate - Cannot attach notes to unvalidated device data Resp - Cannot attach notes to unvalidated device data BP - Cannot attach notes to unvalidated device data  Hd copleted  

## 2020-01-19 NOTE — Progress Notes (Signed)
Progress Note  Patient Name: Scott [name] Date of Encounter: 01/19/2020  Primary Cardiologist: Kate Sable, MD   Subjective   Laying supine in bed, Still with significant leg swelling, abdominal bloating, sacral edema No family in the room  Inpatient Medications    Scheduled Meds: . vitamin C  500 mg Oral BID  . aspirin EC  81 mg Oral Daily  . atorvastatin  40 mg Oral Daily  . calcium acetate  2,001 mg Oral TID WC  . Chlorhexidine Gluconate Cloth  6 each Topical Q0600  . collagenase   Topical Daily  . epoetin (EPOGEN/PROCRIT) injection  10,000 Units Intravenous Q T,Th,Sa-HD  . feeding supplement (NEPRO CARB STEADY)  237 mL Oral BID BM  . FLUoxetine  20 mg Oral Daily  . heparin  5,000 Units Subcutaneous Q8H  . midodrine  10 mg Oral TID WC  . multivitamin  1 tablet Oral QHS  . pantoprazole  40 mg Oral Daily  . sodium chloride flush  3 mL Intravenous Q12H   Continuous Infusions: . sodium chloride Stopped (01/13/20 0231)   PRN Meds: sodium chloride, acetaminophen **OR** acetaminophen, diphenhydrAMINE, HYDROcodone-acetaminophen, HYDROcodone-acetaminophen, ondansetron **OR** ondansetron (ZOFRAN) IV, ondansetron (ZOFRAN) IV   Vital Signs    Vitals:   01/19/20 1400 01/19/20 1415 01/19/20 1424 01/19/20 1430  BP: (!) 108/48 95/63 (!) 109/58 (!) 102/39  Pulse: (!) 48 70 70 73  Resp: 12 12 17 17   Temp:      TempSrc:      SpO2:      Weight:      Height:        Intake/Output Summary (Last 24 hours) at 01/19/2020 1452 Last data filed at 01/18/2020 1700 Gross per 24 hour  Intake 240 ml  Output --  Net 240 ml   Last 3 Weights 01/19/2020 01/18/2020 01/17/2020  Weight (lbs) 231 lb 0.7 oz (No Data) 238 lb 15.7 oz  Weight (kg) 104.8 kg (No Data) 108.4 kg      Telemetry    Normal sinus rhythm- Personally Reviewed  ECG     - Personally Reviewed  Physical Exam   GEN: No acute distress.   Neck: No JVD Cardiac: RRR, no murmurs, rubs, or gallops.  Respiratory:  Clear to auscultation bilaterally. GI: Soft, nontender, non-distended  MS: No edema; No deformity. Neuro:  Nonfocal  Psych: Normal affect   Labs    High Sensitivity Troponin:   Recent Labs  Lab 01/12/20 2119 01/13/20 0009 01/13/20 1521  TROPONINIHS 60* 63* 49*      Chemistry Recent Labs  Lab 01/12/20 2119 01/13/20 0009 01/17/20 0630 01/18/20 1039 01/19/20 1014  NA 136   < > 138 139 138  K 4.1   < > 3.4* 3.2* 3.8  CL 102   < > 98 98 96*  CO2 22   < > 28 29 30   GLUCOSE 146*   < > 109* 97 121*  BUN 52*   < > 32* 24* 26*  CREATININE 6.60*   < > 4.85* 3.74* 4.33*  CALCIUM 7.4*   < > 7.4* 7.8* 7.9*  PROT 6.2*  --   --   --   --   ALBUMIN 2.4*  --   --  3.0* 3.0*  AST 18  --   --   --   --   ALT 13  --   --   --   --   ALKPHOS 122  --   --   --   --  BILITOT 0.8  --   --   --   --   GFRNONAA 8*   < > 12* 16* 14*  GFRAA 10*   < > 14* 19* 16*  ANIONGAP 12   < > 12 12 12    < > = values in this interval not displayed.     Hematology Recent Labs  Lab 01/17/20 0630 01/18/20 1039 01/19/20 1014  WBC 10.2 11.2* 12.8*  RBC 2.68* 2.69* 3.04*  HGB 8.2* 8.0* 9.2*  HCT 26.2* 26.5* 30.7*  MCV 97.8 98.5 101.0*  MCH 30.6 29.7 30.3  MCHC 31.3 30.2 30.0  RDW 16.9* 17.0* 16.9*  PLT 219 214 268    BNP Recent Labs  Lab 01/12/20 2119  BNP 3,262.0*     DDimer No results for input(s): DDIMER in the last 168 hours.   Radiology    US Venous Img Upper Uni Right(DVT)  Result Date: 01/17/2020 CLINICAL DATA:  Right upper extremity edema. History of central line placement. Evaluate for DVT. EXAM: RIGHT UPPER EXTREMITY VENOUS DOPPLER ULTRASOUND TECHNIQUE: Gray-scale sonography with graded compression, as well as color Doppler and duplex ultrasound were performed to evaluate the upper extremity deep venous system from the level of the subclavian vein and including the jugular, axillary, basilic, radial, ulnar and upper cephalic vein. Spectral Doppler was utilized to evaluate flow  at rest and with distal augmentation maneuvers. COMPARISON:  None. FINDINGS: Contralateral Subclavian Vein: Respiratory phasicity is normal and symmetric with the symptomatic side. No evidence of thrombus. Normal compressibility. Internal Jugular Vein: No evidence of thrombus. Normal compressibility, respiratory phasicity and response to augmentation. Subclavian Vein: No evidence of thrombus. Normal compressibility, respiratory phasicity and response to augmentation. Axillary Vein: No evidence of thrombus. Normal compressibility, respiratory phasicity and response to augmentation. Cephalic Vein: No evidence of thrombus. Normal compressibility, respiratory phasicity and response to augmentation. Basilic Vein: No evidence of thrombus. Normal compressibility, respiratory phasicity and response to augmentation. Brachial Veins: No evidence of thrombus within either of the paired brachial veins. Normal compressibility, respiratory phasicity and response to augmentation. Radial Veins: No evidence of thrombus. Normal compressibility, respiratory phasicity and response to augmentation. Ulnar Veins: No evidence of thrombus. Normal compressibility, respiratory phasicity and response to augmentation. Venous Reflux:  None visualized. Other Findings: There is a moderate amount of subcutaneous edema at the level of the right upper arm (images 12 through 14) IMPRESSION: No evidence of DVT within the right upper extremity. Electronically Signed   By: Sandi Mariscal M.D.   On: 01/17/2020 16:36   DG Chest Port 1 View  Result Date: 01/18/2020 CLINICAL DATA:  Acute respiratory distress EXAM: PORTABLE CHEST 1 VIEW COMPARISON:  01/12/2020 FINDINGS: Cardiomegaly. Dialysis catheter on the right with tip at the upper cavoatrial junction. Interstitial opacity and moderate right pleural effusion. No pneumothorax. IMPRESSION: CHF pattern similar to 6 days ago. Unchanged right pleural effusion which was moderate by recent CT. Electronically  Signed   By: Monte Fantasia M.D.   On: 01/18/2020 05:14    Cardiac Studies     Patient Profile     59 y.o. male with history of CAD s/p PCI to LAD x 3, OM2, and PDA, HFrEF secondary ICM, pulmonary hypertension, ascending aortic aneurysm, ESRD on PD, anemia of chronic disease, diabetes, and HTN who we are seeing for evaluation of acute on chronic HFrEF.  Assessment & Plan    End-stage renal disease on hemodialysis Still with massive fluid overload extending up to his abdomen, even with sacral edema -  Needs daily continued hemodialysis to achieve dry weight Hemodialysis as an outpatient, has failed peritoneal dialysis  CAD with stable angina status post PCI(3stent placement to the LAD, OM2 and PDAin 2017) On aspirin, statin, Stress test November 2020 no ischemia No further work-up at this time  Anemia of chronic disease/renal disease EPO with dialysis Goal hemoglobin 10 Managed by nephrology  Hypoalbuminemia Receiving albumin with dialysis  PAD History of toe amputations bilateral feet, nonhealing vascular wounds Bandages on his toes High risk of further complications  Cardiomyopathy, ischemic Ejection fraction previously 35 to 40%, This admission 25 to 30% Fluid managed by hemodialysis Would likely take an additional week to get all the fluid off  Case discussed in detail with palliative care Will address family's concerns  Total encounter time more than 35 minutes  Greater than 50% was spent in counseling and coordination of care with the patient   For questions or updates, please contact [place] Please consult [email or web address] for contact info under        Signed, Ida Rogue, MD  01/19/2020, 2:52 PM

## 2020-01-19 NOTE — Care Management Important Message (Signed)
Important Message  Patient Details  Name: Scott [name] MRN: [id number] Date of Birth: 31-Dec-1960   Medicare Important Message Given:  Yes     Juliann Pulse A Uniqua Kihn 01/19/2020, 11:08 AM

## 2020-01-19 NOTE — Progress Notes (Signed)
Patient ID: Scott [name], male   DOB: Jun 08, 1961, 59 y.o.   MRN: [id number] Per earlier discussion with patient he is DNR/DNI. Asencion Gowda, NP present in the room during conversation  Dr Juleen China  aware

## 2020-01-19 NOTE — Progress Notes (Signed)
[facility] [place] NAME: Scott [name]    MR#:  [id number]  DATE OF BIRTH:  May 15, 1961  SUBJECTIVE:  patient denies any complaints. Ate very little according to sitter in the room. Feels weak. Denies any pain. Shortness of breath with activity.   REVIEW OF SYSTEMS:   Review of Systems  Constitutional: Negative for chills, fever and weight loss.  HENT: Negative for ear discharge, ear pain and nosebleeds.   Eyes: Negative for blurred vision, pain and discharge.  Respiratory: Positive for shortness of breath. Negative for sputum production, wheezing and stridor.   Cardiovascular: Negative for chest pain, palpitations, orthopnea and PND.  Gastrointestinal: Negative for abdominal pain, diarrhea, nausea and vomiting.  Genitourinary: Negative for frequency and urgency.  Musculoskeletal: Negative for back pain and joint pain.  Neurological: Positive for weakness. Negative for sensory change, speech change and focal weakness.  Psychiatric/Behavioral: Negative for depression and hallucinations. The patient is not nervous/anxious.    Tolerating Diet:yes Tolerating PT: pending  DRUG ALLERGIES:  No Known Allergies  VITALS:  Blood pressure (!) 80/62, pulse 65, temperature (!) 97.5 F (36.4 C), temperature source Oral, resp. rate (!) 0, height 5\' 9"  (1.753 m), weight 104.8 kg, SpO2 99 %.  PHYSICAL EXAMINATION:   Physical Exam  GENERAL:  59 y.o.-year-old patient lying in the bed with no acute distress. Appears chronically ill, obese EYES: Pupils equal, round, reactive to light and accommodation. No scleral icterus.   HEENT: Head atraumatic, normocephalic. Oropharynx and nasopharynx clear.  NECK:  Supple, no jugular venous distention. No thyroid enlargement, no tenderness.  LUNGS:  decreased breath sounds bilaterally, no wheezing, rales, rhonchi. No use of accessory muscles of respiration.  CARDIOVASCULAR: S1, S2 normal. No murmurs, rubs, or  gallops.  ABDOMEN: Soft, nontender, nondistended. Bowel sounds present. No organomegaly or mass.  EXTREMITIES: edema ++ NEUROLOGIC: Cranial nerves II through XII are intact. No focal Motor or sensory deficits b/l.   PSYCHIATRIC:  patient is alert and oriented x 3.  SKIN: dressing on both heels bilaterally. Pressure Injury 01/12/20 Coccyx Stage 2 -  Partial thickness loss of dermis presenting as a shallow open injury with a red, pink wound bed without slough. stage 2 pressure ulcer  (Active)  01/12/20 2330  Location: Coccyx  Location Orientation:   Staging: Stage 2 -  Partial thickness loss of dermis presenting as a shallow open injury with a red, pink wound bed without slough.  Wound Description (Comments): stage 2 pressure ulcer   Present on Admission: Yes     Pressure Injury 01/12/20 Heel (Active)  01/12/20 2330  Location: Heel  Location Orientation:   Staging:   Wound Description (Comments):   Present on Admission:         LABORATORY PANEL:  CBC Recent Labs  Lab 01/19/20 1014  WBC 12.8*  HGB 9.2*  HCT 30.7*  PLT 268    Chemistries  Recent Labs  Lab 01/12/20 2119 01/13/20 0009 01/19/20 1014  NA 136   < > 138  K 4.1   < > 3.8  CL 102   < > 96*  CO2 22   < > 30  GLUCOSE 146*   < > 121*  BUN 52*   < > 26*  CREATININE 6.60*   < > 4.33*  CALCIUM 7.4*   < > 7.9*  AST 18  --   --   ALT 13  --   --   ALKPHOS 122  --   --  BILITOT 0.8  --   --    < > = values in this interval not displayed.   Cardiac Enzymes No results for input(s): TROPONINI in the last 168 hours. RADIOLOGY:  US Venous Img Upper Uni Right(DVT)  Result Date: 01/17/2020 CLINICAL DATA:  Right upper extremity edema. History of central line placement. Evaluate for DVT. EXAM: RIGHT UPPER EXTREMITY VENOUS DOPPLER ULTRASOUND TECHNIQUE: Gray-scale sonography with graded compression, as well as color Doppler and duplex ultrasound were performed to evaluate the upper extremity deep venous system from the  level of the subclavian vein and including the jugular, axillary, basilic, radial, ulnar and upper cephalic vein. Spectral Doppler was utilized to evaluate flow at rest and with distal augmentation maneuvers. COMPARISON:  None. FINDINGS: Contralateral Subclavian Vein: Respiratory phasicity is normal and symmetric with the symptomatic side. No evidence of thrombus. Normal compressibility. Internal Jugular Vein: No evidence of thrombus. Normal compressibility, respiratory phasicity and response to augmentation. Subclavian Vein: No evidence of thrombus. Normal compressibility, respiratory phasicity and response to augmentation. Axillary Vein: No evidence of thrombus. Normal compressibility, respiratory phasicity and response to augmentation. Cephalic Vein: No evidence of thrombus. Normal compressibility, respiratory phasicity and response to augmentation. Basilic Vein: No evidence of thrombus. Normal compressibility, respiratory phasicity and response to augmentation. Brachial Veins: No evidence of thrombus within either of the paired brachial veins. Normal compressibility, respiratory phasicity and response to augmentation. Radial Veins: No evidence of thrombus. Normal compressibility, respiratory phasicity and response to augmentation. Ulnar Veins: No evidence of thrombus. Normal compressibility, respiratory phasicity and response to augmentation. Venous Reflux:  None visualized. Other Findings: There is a moderate amount of subcutaneous edema at the level of the right upper arm (images 12 through 14) IMPRESSION: No evidence of DVT within the right upper extremity. Electronically Signed   By: Sandi Mariscal M.D.   On: 01/17/2020 16:36   DG Chest Port 1 View  Result Date: 01/18/2020 CLINICAL DATA:  Acute respiratory distress EXAM: PORTABLE CHEST 1 VIEW COMPARISON:  01/12/2020 FINDINGS: Cardiomegaly. Dialysis catheter on the right with tip at the upper cavoatrial junction. Interstitial opacity and moderate right  pleural effusion. No pneumothorax. IMPRESSION: CHF pattern similar to 6 days ago. Unchanged right pleural effusion which was moderate by recent CT. Electronically Signed   By: Monte Fantasia M.D.   On: 01/18/2020 05:14   ASSESSMENT AND PLAN:  Scott [name] is an 59 y.o. male with medical history significant forESRD on peritoneal dialysis, chronic systolic CHF (EF 93-71% by TTE 09/18/2018), CAD, insulin-dependent type 2 diabetes, anemia of chronic disease, hypertension, hyperlipidemia, peripheral vascular disease whois admitted with acute respiratory failure with hypoxia due to acute on chronic systolic CHF in setting of ESRD on PD  Acute respiratory failure with hypoxia due to acute on chronic systolic CHF in setting of ESRD on peritoneal dialysis: Requiring 4 L oxygen via nasal cannula - CT Angio to r/o PE - negatvie for PE, showed large pleural effusion - 2D echo showed EF estimated at 25 to 30%.  Appreciate assistance from cardiologist. -Check chest x-ray in the morning ?thoracentesis  End Stage Renal Disease: now on HD dialysis.   -Status post temporary femoral  dialysis catheter placement by vascular surgery on 5/15 and 1 session of hemo dialysis--removed today -s/ptunneled dialysis catheter on5/17.   -Follow-up with nephrologist for hemodialysis-- patient has outpatient chair time.  Persistent hypotension Continue midodrine. Echo shows severe global hypokinesis. EF 25-30% Tachycardia has improved.  CAD/hyperlipidemia Chest pain.  Mildly elevated troponin likely from  demand ischemia.  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  - Vascular surgeon said amputation is the only next option.  Continue local wound care. -Continue aspirin and statin  Anemia of chronic kidney disease: stable withoutobvious bleeding. EPO per nephrology at dialysis  Insulin-dependent type 2 diabetes: - sensitive SSI while in hospital and  adjust as needed.  Nonhealing vascular wounds to bilateral feet. 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 Remaining toes are necrotic and nonintact on plantar surface - Present on admission - appreciate WOC nurse eval and input - Wound type:Vascualr wounds and pressure/moisture to buttocks. - Measurement:sacrum: 3 cm x 2.4 cm slough to wound bed - dressing changes per [place] team  Weakness Physical therapist has been consulted.  Depression with ? suicidal ideation  d/c sitter per Psychiatrist.  Right arm swelling No DVT on venous duplex  Discussed today with [facility] practitioner in the room. Patient seems to be progressively declining but multiple medical problems. There were times when patient had expressed wish to stop dialysis and be comfort care. Discuss code status today and patient is DNR DNI. He clearly stated no CPR and no ventilator. Patient wants to continue hemodialysis. He understands the risk and complications related to it given his multiple other comorbidities.  Patient is in agreement to work with physical therapy. He already has outpatient chair time. Depending on how he performs with physical therapy will consult TOC for discharge planning.   Procedures:HD cath placement Family communication :Dr Juleen China to speak with GF Consults : cardiology, nephrology, vascular CODE STATUS: DNR/DNI DVT Prophylaxis :heparin  Status is: Inpatient  Remains inpatient appropriate because:Physical therapy consultation pending.   Dispo: The patient is from: Home              Anticipated d/c is SN:[id number]  Home              Anticipated d/c date is: 1 day              Patient currently is not medically stable to d/c.  TOTAL TIME TAKING CARE OF THIS PATIENT: *35 minutes.  >50% time spent on counselling and coordination of care  Note: This dictation was prepared with Dragon dictation along with smaller phrase technology. Any transcriptional errors that result  from this process are unintentional.  Fritzi Mandes M.D    [facility]   CC: Primary care physician; Inc, [place] ServicesPatient ID: AKONI PARTON, male   DOB: [birth year], 59 y.o.   MRN: [id number]

## 2020-01-19 NOTE — Progress Notes (Addendum)
Daily Progress Note   Patient Name: Scott [name]       Date: 01/19/2020 DOB: Sep 25, 1960  Age: 59 y.o. MRN#: [id number] Attending Physician: Fritzi Mandes, MD Primary Care Physician: [place] Date: 01/12/2020  Reason for Consultation/Follow-up: Establishing goals of care  Subjective: Patient is resting in bed. No family at bedside. He states he has decided he would like to do everything possible to live as long as possible. Returned to bedside with attending team Dr. Posey Pronto for additional conversation. Discussed his status, QOL, and concern for pain and suffering. He understands we will honor whatever decisions he makes. Upon further discussion of his wishes, he answers that he would not want to be placed on a ventilator to help with breathing, and would not want chest compressions, shocks, or a breathing tube if his heart or breathing stopped, and would want a peaceful death. He restated these decisions for no CPR and no breathing machine to she and I again.    I completed a MOST form today and the signed original was placed in the chart. A photocopy was placed in the chart to be scanned into EMR. The patient outlined their wishes for the following treatment decisions:  Cardiopulmonary Resuscitation: Do Not Attempt Resuscitation (DNR/No CPR)  Medical Interventions: Limited Additional Interventions: Use medical treatment, IV fluids and cardiac monitoring as indicated, DO NOT USE intubation or mechanical ventilation. May consider use of less invasive airway support such as BiPAP or CPAP. Also provide comfort measures. Transfer to the hospital if indicated. Avoid intensive care.   Antibiotics: Antibiotics if indicated  IV Fluids: IV fluids if indicated  Feeding Tube: Feeding  tube long-term if indicated    Code status changed by primary team.      [name] is not married. He has a partner with whom he had children with, but he is clear, he would want to follow the natural order of decision makers and have his children to make decisions for him if he is unable.      Length of Stay: 7  Current Medications: Scheduled Meds:  . vitamin C  500 mg Oral BID  . aspirin EC  81 mg Oral Daily  . atorvastatin  40 mg Oral Daily  . calcium acetate  2,001 mg Oral TID WC  .  Chlorhexidine Gluconate Cloth  6 each Topical Q0600  . collagenase   Topical Daily  . epoetin (EPOGEN/PROCRIT) injection  10,000 Units Intravenous Q T,Th,Sa-HD  . feeding supplement (NEPRO CARB STEADY)  237 mL Oral BID BM  . FLUoxetine  20 mg Oral Daily  . heparin  5,000 Units Subcutaneous Q8H  . midodrine  10 mg Oral TID WC  . multivitamin  1 tablet Oral QHS  . pantoprazole  40 mg Oral Daily  . sodium chloride flush  3 mL Intravenous Q12H    Continuous Infusions: . sodium chloride Stopped (01/13/20 0231)    PRN Meds: sodium chloride, acetaminophen **OR** acetaminophen, diphenhydrAMINE, HYDROcodone-acetaminophen, HYDROcodone-acetaminophen, ondansetron **OR** ondansetron (ZOFRAN) IV, ondansetron (ZOFRAN) IV  Physical Exam Pulmonary:     Effort: Pulmonary effort is normal.  Neurological:     Mental Status: He is alert.             Vital Signs: BP (!) 99/49   Pulse 78   Temp 97.8 F (36.6 C) (Oral)   Resp 20   Ht 5\' 9"  (1.753 m)   Wt 104.8 kg   SpO2 98%   BMI 34.12 kg/m  SpO2: SpO2: 98 % O2 Device: O2 Device: Nasal Cannula O2 Flow Rate: O2 Flow Rate (L/min): 4 L/min  Intake/output summary:   Intake/Output Summary (Last 24 hours) at 01/19/2020 1107 Last data filed at 01/18/2020 1700 Gross per 24 hour  Intake 352 ml  Output 2005 ml  Net -1653 ml   LBM: Last BM Date: 01/18/20 Baseline Weight: Weight: 102.1 kg Most recent weight: Weight: 104.8 kg       Palliative  Assessment/Data:      Patient Active Problem List   Diagnosis Date Noted  . Pressure injury of skin 01/13/2020  . Acute respiratory failure with hypoxia ([place]) 01/12/2020  . ESRD on peritoneal dialysis ([place]) 01/12/2020  . Hyperlipidemia associated with type 2 diabetes mellitus ([place]) 01/12/2020  . Generalized (acute) peritonitis ([place]) 10/09/2019  . Generalized abdominal pain 10/02/2019  . Leg pain, right 07/31/2019  . PAD (peripheral artery disease) ([place]) 07/17/2019  . Osteomyelitis of second toe of left foot ([place]) 07/17/2019  . Type II diabetes mellitus ([place])   . PVD (peripheral vascular disease) ([place])   . Non-Q wave myocardial infarction ([place])   . Hypertension associated with diabetes ([place])   . Coronary artery disease   . Ulcerated, foot, right, with necrosis of muscle ([place])   . Atherosclerosis of artery of extremity with ulceration ([place]) 07/03/2019  . Anemia 04/15/2019  . Cellulitis 01/21/2019  . History of colonic polyps   . Positive fecal occult blood test   . 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 Atherosclerotic heart disease of native coronary artery without angina pectoris 06/02/2017  . Encounter for screening for depression 06/02/2017  . Gastro-esophageal reflux disease with esophagitis 06/02/2017  . Other asthma 06/02/2017  . Other specified arthritis, unspecified site 06/02/2017  . Acidosis 05/31/2017  . Anemia in ESRD (end-stage renal disease) ([place]) 05/31/2017  . Hyperkalemia 05/31/2017  . Iron deficiency anemia, unspecified 05/31/2017  . Liver disease, unspecified 05/31/2017  . Moderate protein-calorie malnutrition ([place]) 05/31/2017  . Other dietary vitamin B12 deficiency anemia 05/31/2017  . Other disorders of electrolyte and fluid balance, not elsewhere classified 05/31/2017  . Other disorders resulting from impaired renal tubular function 05/31/2017  . Other hyperlipidemia 05/31/2017  . Other long term  (current) drug therapy 05/31/2017  . Secondary hyperparathyroidism of renal  origin ([place]) 05/31/2017  . Unspecified jaundice 05/31/2017  . Acute on chronic systolic CHF (congestive heart failure) ([place]) 01/17/2017  . ESRD (end stage renal disease) ([place]) 11/25/2016  . Tobacco abuse 11/25/2016  . Non-ST elevation (NSTEMI) myocardial infarction ([place]) 10/21/2016  . Pure hypercholesterolemia 10/21/2016  . Type 2 diabetes mellitus with other diabetic kidney complication ([place]) 44/08/270  . GERD (gastroesophageal reflux disease) 04/04/2015  . Congestive heart failure ([place]) 12/13/2014    Palliative Care Assessment & Plan   Recommendations/Plan: Patient states he would like DNR/DNI status.     Code Status:    Code Status Orders  (From admission, onward)         Start     Ordered   01/12/20 2328  Full code  Continuous     01/12/20 2328        Code Status History    Date Active Date Inactive Code Status Order ID Comments User Context   07/31/2019 1021 08/13/2019 0200 Full Code [id number]  Damita Lack, MD ED   07/17/2019 1358 07/21/2019 2002 Full Code [id number]  Para Skeans, MD ED   05/22/2019 1830 05/26/2019 1657 Full Code [id number]  Demetrios Loll, MD Inpatient   09/18/2018 0022 09/19/2018 1608 Full Code [id number]  Mayo, Pete Pelt, MD ED   03/09/2018 1206 03/12/2018 1712 Full Code [id number]  Henreitta Leber, MD Inpatient   01/17/2017 0338 01/20/2017 1834 Full Code [id number]  Harrie Foreman, MD ED   11/13/2016 1013 11/20/2016 1256 Full Code [id number]  Hillary Bow, MD ED   01/09/2015 1019 01/10/2015 1518 Full Code [id number]  Max Sane, MD Inpatient   12/18/2014 1620 12/19/2014 1627 Full Code [id number]  Charolette Forward, MD Inpatient   12/14/2014 1600 12/18/2014 1620 Full Code [id number]  Charolette Forward, MD Inpatient   12/13/2014 1324 12/14/2014 1600 Full Code [id number]  Charolette Forward, MD Inpatient   Advance Care Planning Activity      Prognosis:  Poor   Care plan was discussed with  MD  Thank you for allowing the Palliative Medicine Team to assist in the care of this patient.   Total Time 62min Prolonged Time Billed  no      Greater than 50%  of this time was spent counseling and coordinating care related to the above assessment and plan.  Asencion Gowda, NP  Please contact Palliative Medicine Team phone at [phone] for questions and concerns.

## 2020-01-19 NOTE — Progress Notes (Signed)
OT Cancellation Note  Patient Details Name: Scott [name] MRN: [id number] DOB: [birth year]   Cancelled Treatment:    Reason Eval/Treat Not Completed: Other (comment). Per chart review, pt had family meeting yesterday to discuss transition to comfort care. Goals not established yet, will hold at this time and await further POC.  Dessie Coma, M.S. OTR/L  01/19/20, 10:19 AM

## 2020-01-19 NOTE — Progress Notes (Signed)
This note also relates to the following rows which could not be included: Pulse Rate - Cannot attach notes to unvalidated device data  Hd started  

## 2020-01-19 NOTE — Progress Notes (Signed)
[facility]  ROUNDING NOTE   Subjective:   Sitter at bedside.  Family meeting yesterday. Patient wants to continue dialysis for now. He is frustrated with his chronic illness.    Objective:  Vital signs in last 24 hours:  Temp:  [97.3 F (36.3 C)-98.3 F (36.8 C)] 97.8 F (36.6 C) (05/21 0813) Pulse Rate:  [31-92] 78 (05/21 1016) Resp:  [15-32] 20 (05/21 0813) BP: (88-138)/(32-119) 99/49 (05/21 1016) SpO2:  [94 %-100 %] 98 % (05/21 1016) Weight:  [104.8 kg] 104.8 kg (05/21 0500)  Weight change:  Filed Weights   01/15/20 1500 01/17/20 0432 01/19/20 0500  Weight: 110.5 kg 108.4 kg 104.8 kg    Intake/Output: I/O last 3 completed shifts: In: 387 [P.O.:592] Out: 2005 [Other:2005]   Intake/Output this shift:  No intake/output data recorded.  Physical Exam: General: NAD, laying in bed  Head: Normocephalic, atraumatic. Moist oral mucosal membranes  Eyes: Anicteric, PERRL  Neck: Supple, trachea midline  Lungs:  Bilateral crackles  Heart: Regular rate and rhythm  Abdomen:  Abdominal wall edema  Extremities:  ++ peripheral edema.  Neurologic: Nonfocal, moving all four extremities  Skin: No lesions  Access: Peritoneal dialysis catheter Right arm AVF - not functioning      Basic Metabolic Panel: Recent Labs  Lab 01/13/20 0028 01/13/20 0028 01/14/20 0559 01/14/20 0559 01/16/20 0401 01/17/20 0630 01/18/20 1039  NA 136  --  140  --  139 138 139  K 3.7  --  3.7  --  3.7 3.4* 3.2*  CL 102  --  103  --  100 98 98  CO2 22  --  25  --  28 28 29   GLUCOSE 124*  --  88  --  114* 109* 97  BUN 52*  --  49*  --  41* 32* 24*  CREATININE 6.63*  --  6.06*  --  6.05* 4.85* 3.74*  CALCIUM 7.2*   < > 7.2*   < > 7.0* 7.4* 7.8*  PHOS  --   --   --   --   --   --  3.5   < > = values in this interval not displayed.    Liver Function Tests: Recent Labs  Lab 01/12/20 2119 01/18/20 1039  AST 18  --   ALT 13  --   ALKPHOS 122  --   BILITOT 0.8  --   PROT 6.2*  --    ALBUMIN 2.4* 3.0*   No results for input(s): LIPASE, AMYLASE in the last 168 hours. No results for input(s): AMMONIA in the last 168 hours.  CBC: Recent Labs  Lab 01/12/20 2119 01/12/20 2119 01/13/20 0028 01/14/20 0559 01/16/20 0401 01/17/20 0630 01/18/20 1039  WBC 10.4   < > 8.5 7.0 9.5 10.2 11.2*  NEUTROABS 9.2*  --   --   --   --   --   --   HGB 9.3*   < > 8.6* 8.2* 8.5* 8.2* 8.0*  HCT 29.9*   < > 27.6* 26.9* 27.9* 26.2* 26.5*  MCV 96.1   < > 97.2 98.5 99.3 97.8 98.5  PLT 288   < > 268 237 245 219 214   < > = values in this interval not displayed.    Cardiac Enzymes: No results for input(s): CKTOTAL, CKMB, CKMBINDEX, TROPONINI in the last 168 hours.  BNP: Invalid input(s): POCBNP  CBG: Recent Labs  Lab 01/17/20 2012  GLUCAP 96    Microbiology: Results for orders  placed or performed during the hospital encounter of 01/12/20  SARS Coronavirus 2 by RT PCR (hospital order, performed in [facility] hospital lab) Nasopharyngeal Nasopharyngeal Swab     Status: None   Collection Time: 01/12/20  9:08 PM   Specimen: Nasopharyngeal Swab  Result Value Ref Range Status   SARS Coronavirus 2 NEGATIVE NEGATIVE Final    Comment: (NOTE) SARS-CoV-2 target nucleic acids are NOT DETECTED. The SARS-CoV-2 RNA is generally detectable in upper and lower respiratory specimens during the acute phase of infection. The lowest concentration of SARS-CoV-2 viral copies this assay can detect is 250 copies / mL. A negative result does not preclude SARS-CoV-2 infection and should not be used as the sole basis for treatment or other patient management decisions.  A negative result may occur with improper specimen collection / handling, submission of specimen other than nasopharyngeal swab, presence of viral mutation(s) within the areas targeted by this assay, and inadequate number of viral copies (<250 copies / mL). A negative result must be combined with clinical observations, patient  history, and epidemiological information. Fact Sheet for Patients:   StrictlyIdeas.no Fact Sheet for Healthcare Providers: BankingDealers.co.za This test is not yet approved or cleared  by the [place] FDA and has been authorized for detection and/or diagnosis of SARS-CoV-2 by FDA under an Emergency Use Authorization (EUA).  This EUA will remain in effect (meaning this test can be used) for the duration of the COVID-19 declaration under Section 564(b)(1) of the Act, 21 U.S.C. section 360bbb-3(b)(1), unless the authorization is terminated or revoked sooner. Performed at [facility], [address], [place], [place] [address]   Body fluid culture     Status: None   Collection Time: 01/13/20 10:45 AM   Specimen: Peritoneal Washings; Body Fluid  Result Value Ref Range Status   Specimen Description   Final    PERITONEAL Performed at [facility], [address], [place], [place] [address]    Special Requests   Final    NONE Performed at [facility], [place], [place] [address]    Gram Stain NO WBC SEEN NO ORGANISMS SEEN   Final   Culture   Final    NO GROWTH 3 DAYS Performed at [facility], [place] [address], [place], [place] [address]    Report Status 01/17/2020 FINAL  Final  Urine Culture     Status: None   Collection Time: 01/14/20  3:30 AM   Specimen: Urine, Random  Result Value Ref Range Status   Specimen Description   Final    URINE, RANDOM Performed at [facility], [address], [place], [place] [address]    Special Requests   Final    NONE Performed at [facility], [address], [place], [place] [address]    Culture   Final    NO GROWTH Performed at [facility], [place] [address], [place], [place] [address]    Report Status 01/15/2020 FINAL  Final    Coagulation Studies: No results for input(s): LABPROT, INR in the last 72  hours.  Urinalysis: Recent Labs    01/17/20 0016  COLORURINE YELLOW*  LABSPEC 1.026  PHURINE 5.0  GLUCOSEU NEGATIVE  HGBUR LARGE*  BILIRUBINUR NEGATIVE  KETONESUR NEGATIVE  PROTEINUR 100*  NITRITE NEGATIVE  LEUKOCYTESUR LARGE*      Imaging: US Venous Img Upper Uni Right(DVT)  Result Date: 01/17/2020 CLINICAL DATA:  Right upper extremity edema. History of central line  placement. Evaluate for DVT. EXAM: RIGHT UPPER EXTREMITY VENOUS DOPPLER ULTRASOUND TECHNIQUE: Gray-scale sonography with graded compression, as well as color Doppler and duplex ultrasound were performed to evaluate the upper extremity deep venous system from the level of the subclavian vein and including the jugular, axillary, basilic, radial, ulnar and upper cephalic vein. Spectral Doppler was utilized to evaluate flow at rest and with distal augmentation maneuvers. COMPARISON:  None. FINDINGS: Contralateral Subclavian Vein: Respiratory phasicity is normal and symmetric with the symptomatic side. No evidence of thrombus. Normal compressibility. Internal Jugular Vein: No evidence of thrombus. Normal compressibility, respiratory phasicity and response to augmentation. Subclavian Vein: No evidence of thrombus. Normal compressibility, respiratory phasicity and response to augmentation. Axillary Vein: No evidence of thrombus. Normal compressibility, respiratory phasicity and response to augmentation. Cephalic Vein: No evidence of thrombus. Normal compressibility, respiratory phasicity and response to augmentation. Basilic Vein: No evidence of thrombus. Normal compressibility, respiratory phasicity and response to augmentation. Brachial Veins: No evidence of thrombus within either of the paired brachial veins. Normal compressibility, respiratory phasicity and response to augmentation. Radial Veins: No evidence of thrombus. Normal compressibility, respiratory phasicity and response to augmentation. Ulnar Veins: No evidence of thrombus.  Normal compressibility, respiratory phasicity and response to augmentation. Venous Reflux:  None visualized. Other Findings: There is a moderate amount of subcutaneous edema at the level of the right upper arm (images 12 through 14) IMPRESSION: No evidence of DVT within the right upper extremity. Electronically Signed   By: Sandi Mariscal M.D.   On: 01/17/2020 16:36   DG Chest Port 1 View  Result Date: 01/18/2020 CLINICAL DATA:  Acute respiratory distress EXAM: PORTABLE CHEST 1 VIEW COMPARISON:  01/12/2020 FINDINGS: Cardiomegaly. Dialysis catheter on the right with tip at the upper cavoatrial junction. Interstitial opacity and moderate right pleural effusion. No pneumothorax. IMPRESSION: CHF pattern similar to 6 days ago. Unchanged right pleural effusion which was moderate by recent CT. Electronically Signed   By: Monte Fantasia M.D.   On: 01/18/2020 05:14     Medications:   . sodium chloride Stopped (01/13/20 0231)   . vitamin C  500 mg Oral BID  . aspirin EC  81 mg Oral Daily  . atorvastatin  40 mg Oral Daily  . calcium acetate  2,001 mg Oral TID WC  . Chlorhexidine Gluconate Cloth  6 each Topical Q0600  . collagenase   Topical Daily  . epoetin (EPOGEN/PROCRIT) injection  10,000 Units Intravenous Q T,Th,Sa-HD  . feeding supplement (NEPRO CARB STEADY)  237 mL Oral BID BM  . FLUoxetine  20 mg Oral Daily  . heparin  5,000 Units Subcutaneous Q8H  . midodrine  10 mg Oral TID WC  . multivitamin  1 tablet Oral QHS  . pantoprazole  40 mg Oral Daily  . sodium chloride flush  3 mL Intravenous Q12H   sodium chloride, acetaminophen **OR** acetaminophen, diphenhydrAMINE, HYDROcodone-acetaminophen, HYDROcodone-acetaminophen, ondansetron **OR** ondansetron (ZOFRAN) IV, ondansetron (ZOFRAN) IV  Assessment/ Plan:  Scott [name] is a 59 y.o. white male with end stage renal disease on peritoneal dialysis, hypertension, peripheral vascular disease, diabetes mellitus type II, asthma , left toe  amputation who is admitted to [facility] on 01/12/2020 for ESRD (end stage renal disease) on dialysis ([place]) [N18.6, Z99.2] Acute respiratory failure with hypoxia ([place]) [J96.01] Congestive heart failure, unspecified HF chronicity, unspecified heart failure type ([place]) [I50.9]  [place] 99kg Outpatient Peritoneal dialysis CAPD 4 exchanges 2 liter fills  1. End Stage Renal Disease: transition from peritoneal  dialysis to hemodialysis.  Outpatient dialysis planning for [employer]. TTS schedule - Dialysis today with ultrafiltration only.  - Dialysis for tomorrow and then monitor daily for dialysis need.    2. Hypotensive: Holding home agents including furosemide. Improved with IV albumin replacement.  - started midodrine.   3. Anemia with chronic kidney disease: normocytic. Hemoglobin 8 - EPO with TTS dialysis treatments.   4. Secondary Hyperparathyroidism: with hyperphosphatemia and hypocalcemia - now improved.  - calcium acetate with meals.   5. Peripheral vascular disease:  - appreciate wound care, podiatry and vascular   LOS: 7 Scott [name] 5/21/202110:43 AM

## 2020-01-19 NOTE — Progress Notes (Signed)
[facility] Liaison RN note  This patient has been referred for community based palliative care to be followed by [facility] at discharge.  Will follow for disposition.  Please call with any questions or concerns.  Thank you, Margaretmary Eddy, BSN, RN [facility] Liaison [phone]

## 2020-01-19 NOTE — TOC Progression Note (Signed)
Transition of Care [facility]) - Progression Note    Patient Details  Name: Scott [name] MRN: [id number] Date of Birth: May 31, 1961  Transition [facility] Care [facility]) CM/SW Contact  Tahesha Skeet, Gardiner Rhyme, LCSW Phone Number: 01/19/2020, 1:42 PM  Clinical Narrative:   Need PT eval to see how well pt is moving, since he will be going back and forth to HD three times per week. He will also need home O2 ordered. He has a chair time [place] T,TH Sat 12:45. Venna-Girlfriend to assist with his care at home. Will continue to follow and assist with discharge needs.    Expected Discharge Plan: Home/Self Care Barriers to Discharge: Continued Medical Work up  Expected Discharge Plan and Services Expected Discharge Plan: Home/Self Care In-house Referral: Clinical Social Work   Post Acute Care Choice: Dialysis Living arrangements for the past 2 months: Single Family Home                                       Social Determinants of Health (SDOH) Interventions    Readmission Risk Interventions Readmission Risk Prevention Plan 08/04/2019 07/19/2019 07/19/2019  Transportation Screening Complete Complete Complete  Medication Review [employer]) Referral to Pharmacy Complete Complete  [place] or Home Care Consult Complete Complete Complete  SW Recovery Care/Counseling Consult Complete - -  Palliative Care Screening Not Applicable Not Applicable -  [place] Not Applicable Not Applicable -  Some recent data might be hidden

## 2020-01-19 NOTE — Progress Notes (Signed)
PT Cancellation Note  Patient Details Name: Scott [name] MRN: [id number] DOB: 1960-10-22   Cancelled Treatment:    Reason Eval/Treat Not Completed: Other (comment). Chart reviewed. Pt now with POC in place and is NOT going comfort care. Temp fem HD cath now removed, however pt in HD at this time and unavailable for therapy. Will re-attempt, time permitting.   Ray,Stephanie 01/19/2020, 1:46 PM Greggory Stallion, PT, DPT [phone]

## 2020-01-20 LAB — CBC
HCT: 29.4 % — ABNORMAL LOW (ref 39.0–52.0)
Hemoglobin: 8.7 g/dL — ABNORMAL LOW (ref 13.0–17.0)
MCH: 30 pg (ref 26.0–34.0)
MCHC: 29.6 g/dL — ABNORMAL LOW (ref 30.0–36.0)
MCV: 101.4 fL — ABNORMAL HIGH (ref 80.0–100.0)
Platelets: 282 10*3/uL (ref 150–400)
RBC: 2.9 MIL/uL — ABNORMAL LOW (ref 4.22–5.81)
RDW: 17.1 % — ABNORMAL HIGH (ref 11.5–15.5)
WBC: 12.9 10*3/uL — ABNORMAL HIGH (ref 4.0–10.5)
nRBC: 0 % (ref 0.0–0.2)

## 2020-01-20 LAB — RENAL FUNCTION PANEL
Albumin: 2.9 g/dL — ABNORMAL LOW (ref 3.5–5.0)
Anion gap: 10 (ref 5–15)
BUN: 28 mg/dL — ABNORMAL HIGH (ref 8–23)
CO2: 30 mmol/L (ref 22–32)
Calcium: 8 mg/dL — ABNORMAL LOW (ref 8.9–10.3)
Chloride: 97 mmol/L — ABNORMAL LOW (ref 98–111)
Creatinine, Ser: 5.18 mg/dL — ABNORMAL HIGH (ref 0.61–1.24)
GFR calc Af Amer: 13 mL/min — ABNORMAL LOW (ref >60)
GFR calc non Af Amer: 11 mL/min — ABNORMAL LOW (ref >60)
Glucose, Bld: 84 mg/dL (ref 70–99)
Phosphorus: 5.1 mg/dL — ABNORMAL HIGH (ref 2.5–4.6)
Potassium: 3.6 mmol/L (ref 3.5–5.1)
Sodium: 137 mmol/L (ref 135–145)

## 2020-01-20 NOTE — Progress Notes (Signed)
This note also relates to the following rows which could not be included: Pulse Rate - Cannot attach notes to unvalidated device data BP - Cannot attach notes to unvalidated device data  Hd completed  

## 2020-01-20 NOTE — Progress Notes (Signed)
PT Cancellation Note  Patient Details Name: Scott [name] MRN: [id number] DOB: Jan 09, 1961   Cancelled Treatment:    Reason Eval/Treat Not Completed: Other (comment).  Pt was eating his meal, reattempt when pt is available.   Ramond Dial 01/20/2020, 9:10 AM   Mee Hives, PT MS Acute Rehab Dept. Number: [place]

## 2020-01-20 NOTE — Consult Note (Signed)
Patient was off the unit for dialysis treatment.  Chart reviewed per physician psychiatry noted on 01/18/20 patient cleared for discharge with supportive outpatient resources.  Patient was restarted on Prozac.  No other concerns noted.

## 2020-01-20 NOTE — Progress Notes (Signed)
Progress Note  Patient Name: Scott [name] Date of Encounter: 01/20/2020  Primary Cardiologist: Kate Sable, MD   Subjective   Patient states feeling better, had dialysis yesterday with 2.5 L of fluid removed.  Currently working with physical therapy.  Satting 89% on 2 L.  Inpatient Medications    Scheduled Meds: . vitamin C  500 mg Oral BID  . aspirin EC  81 mg Oral Daily  . atorvastatin  40 mg Oral Daily  . calcium acetate  2,001 mg Oral TID WC  . Chlorhexidine Gluconate Cloth  6 each Topical Q0600  . collagenase   Topical Daily  . epoetin (EPOGEN/PROCRIT) injection  10,000 Units Intravenous Q T,Th,Sa-HD  . feeding supplement (NEPRO CARB STEADY)  237 mL Oral BID BM  . FLUoxetine  20 mg Oral Daily  . heparin  5,000 Units Subcutaneous Q8H  . midodrine  10 mg Oral TID WC  . multivitamin  1 tablet Oral QHS  . pantoprazole  40 mg Oral Daily  . sodium chloride flush  3 mL Intravenous Q12H   Continuous Infusions: . sodium chloride Stopped (01/13/20 0231)   PRN Meds: sodium chloride, acetaminophen **OR** acetaminophen, alum & mag hydroxide-simeth, diphenhydrAMINE, HYDROcodone-acetaminophen, HYDROcodone-acetaminophen, ondansetron **OR** ondansetron (ZOFRAN) IV, ondansetron (ZOFRAN) IV   Vital Signs    Vitals:   01/20/20 0202 01/20/20 0412 01/20/20 0823 01/20/20 0828  BP:  (!) 103/50 123/80   Pulse:  74 74   Resp:  20 13   Temp:  (!) 97.5 F (36.4 C) 97.7 F (36.5 C)   TempSrc:   Oral   SpO2:  90% 100% 98%  Weight: 100.7 kg     Height:        Intake/Output Summary (Last 24 hours) at 01/20/2020 0946 Last data filed at 01/20/2020 1155 Gross per 24 hour  Intake 237 ml  Output 2500 ml  Net -2263 ml   Last 3 Weights 01/20/2020 01/19/2020 01/18/2020  Weight (lbs) 221 lb 14.4 oz 231 lb 0.7 oz (No Data)  Weight (kg) 100.653 kg 104.8 kg (No Data)      Telemetry    Sinus rhythm- Personally Reviewed  ECG    No new ECG obtained- Personally Reviewed  Physical  Exam   GEN: No acute distress.   Neck: No JVD Cardiac: RRR, no murmurs, rubs, or gallops.  Respiratory:  Decreased breath sounds at bases, clear anteriorly. GI: Soft, nontender, non-distended  MS: 2+ edema; No deformity. Neuro:  Nonfocal  Psych: Normal affect   Labs    High Sensitivity Troponin:   Recent Labs  Lab 01/12/20 2119 01/13/20 0009 01/13/20 1521  TROPONINIHS 60* 63* 49*      Chemistry Recent Labs  Lab 01/18/20 1039 01/19/20 1014 01/20/20 0814  NA 139 138 137  K 3.2* 3.8 3.6  CL 98 96* 97*  CO2 29 30 30   GLUCOSE 97 121* 84  BUN 24* 26* 28*  CREATININE 3.74* 4.33* 5.18*  CALCIUM 7.8* 7.9* 8.0*  ALBUMIN 3.0* 3.0* 2.9*  GFRNONAA 16* 14* 11*  GFRAA 19* 16* 13*  ANIONGAP 12 12 10      Hematology Recent Labs  Lab 01/18/20 1039 01/19/20 1014 01/20/20 0814  WBC 11.2* 12.8* 12.9*  RBC 2.69* 3.04* 2.90*  HGB 8.0* 9.2* 8.7*  HCT 26.5* 30.7* 29.4*  MCV 98.5 101.0* 101.4*  MCH 29.7 30.3 30.0  MCHC 30.2 30.0 29.6*  RDW 17.0* 16.9* 17.1*  PLT 214 268 282    BNPNo results for input(s): BNP, PROBNP  in the last 168 hours.   DDimer No results for input(s): DDIMER in the last 168 hours.   Radiology    No results found.  Cardiac Studies  Echo 01/16/20 1. Left ventricular ejection fraction, by estimation, is 25 to 30%. The  left ventricle has severely decreased function. The left ventricle  demonstrates global hypokinesis. The left ventricular internal cavity size  was moderately dilated. There is mild  left ventricular hypertrophy. Left ventricular diastolic parameters are  consistent with Grade II diastolic dysfunction (pseudonormalization).  Elevated left atrial pressure.  2. Pulmonary artery pressure is at least mildly elevated (PASP 30-35 mmHg  plus central venous pressure). Right ventricular systolic function is  moderately reduced. The right ventricular size is mildly enlarged.  3. Left atrial size was mildly dilated.  4. Right atrial size  was moderately dilated.  5. The mitral valve is normal in structure. Trivial mitral valve  regurgitation. No evidence of mitral stenosis.  6. The aortic valve is tricuspid. Aortic valve regurgitation is not  visualized. Mild aortic valve sclerosis is present, with no evidence of  aortic valve stenosis.  7. Mildly dilated pulmonary artery.  Comparison(s): A prior study was performed on 09/18/2018. LVEF has  decreased from 35-40% to 25-30%.   Lexiscan 07/2019  There was no ST segment deviation noted during stress.  There is a medium defect of severe severity present in the apical anterior, apical septal, apical inferior and apex location.  Findings consistent with prior myocardial infarction.  This is an intermediate risk study.  The left ventricular ejection fraction is severely decreased (<30%).  Patient Profile     59 y.o. male ischemic cardiomyopathy, last EF 25 to 30%, end-stage renal disease previously on peritoneal dialysis, being seen for volume overload and CHF.  Assessment & Plan    A/P: 1.  Volume overload -due to failed peritoneal dialysis -Currently getting hemodialysis as per renal team -2500 cc taken out from yesterday's dialysis.  Has lost about 22 pounds since admission. -Midodrine for pressor support while getting HD.  2.  Ischemic cardiomyopathy, EF 25 to 30% -Last Myoview with no evidence for ischemia, fixed perfusion defect. -Holding CHF meds in light of low blood pressure and need for dialysis -Continue midodrine -CHF meds when patient is euvolemic and blood pressure stable.  3.  End-stage renal disease -Appreciate input from nephrology -Hemodialysis as per renal  4.  History of CAD/PCI -Aspirin, statin   Total encounter time 35 minutes  Greater than 50% was spent in counseling and coordination of care with the patient     Signed, Kate Sable, MD  01/20/2020, 9:46 AM

## 2020-01-20 NOTE — Evaluation (Signed)
Occupational Therapy Evaluation Patient Details Name: Scott [name] MRN: [id number] DOB: Jan 15, 1961 Today's Date: 01/20/2020    History of Present Illness Scott [name] is a 59 y.o. male with medical history significant for ESRD on peritoneal dialysis, chronic systolic CHF (EF 27-74% by TTE 09/18/2018), CAD, insulin-dependent type 2 diabetes, anemia of chronic disease, hypertension, hyperlipidemia, peripheral vascular disease who presents to the ED for evaluation of progressive shortness of breath.  Patient states he has been having progressive shortness of breath over the last 2 weeks.    Clinical Impression   Scott [name] was seen for OT evaluation this date. Prior to hospital admission, pt used w/c and 4WW for mobility in home and required assist from girlfriend for IADLs and LBD. Pt lives c girlfriend in mobile home c 5 steps to enter and walk in shower. Pt presents to acute OT demonstrating impaired ADL performance and functional mobility 2/2 decreased activity tolerance, functional strength/ROM/balance deficits, and decreased safety awareness. Pt currently requires MAX A don B socks at bed level - pt states near baseline for LBD. SETUP self-feeding/drinking at bed level and seated EOB. CGA + RW for unilateral grooming standing EOB. Pt would benefit from skilled OT to address noted impairments and functional limitations (see below for any additional details) in order to maximize safety and independence while minimizing falls risk and caregiver burden. Upon hospital discharge, recommend HHOT to maximize pt safety and return to functional independence during meaningful occupations of daily life.        Follow Up Recommendations  Home health OT    Equipment Recommendations       Recommendations for Other Services       Precautions / Restrictions Precautions Precautions: None Restrictions Weight Bearing Restrictions: No      Mobility Bed Mobility Overal bed mobility: Needs  Assistance Bed Mobility: Supine to Sit;Sit to Supine;Rolling Rolling: Modified independent (Device/Increase time)   Supine to sit: HOB elevated;Min assist Sit to supine: Supervision   General bed mobility comments: Assist for trunk elevation sup>sit. Pt benefitted from log roll technique education, decreased to SBA  Transfers Overall transfer level: Needs assistance Equipment used: Rolling walker (2 wheeled) Transfers: Sit to/from Stand Sit to Stand: Min assist;From elevated surface              Balance Overall balance assessment: Needs assistance Sitting-balance support: No upper extremity supported;Feet supported Sitting balance-Leahy Scale: Good     Standing balance support: Single extremity supported;During functional activity Standing balance-Leahy Scale: Fair Standing balance comment: CGA + RW reaching inside BOS                            ADL either performed or assessed with clinical judgement   ADL Overall ADL's : Needs assistance/impaired                                       General ADL Comments: MAX A don B socks at bed level - pt states near baseline for LBD. SETUP self-feeding/drinking at bed level and seated EOB. CGA + RW for unilateral grooming standing EOB     Vision         Perception     Praxis      Pertinent Vitals/Pain Pain Assessment: No/denies pain     Hand Dominance Right   Extremity/Trunk Assessment Upper Extremity Assessment Upper Extremity Assessment:  LUE deficits/detail;RUE deficits/detail RUE Deficits / Details: 5 finger oppostion intact. Grip grossly WFL. Required VCs for termination of tasks. 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 RN in room decreased to 2L [place]. Standing EOB: SpO2 desat 89% on 2L, did not improve c PLB or O2 increased to 3L. Pt returned to bed c RN in room at end of session    Exercises Exercises: Other exercises Other Exercises Other Exercises: Pt educated re: OT role, falls prevention, DME recs, d/c recs, importance of OOB mobility for functional strengthening, PLB, energy conservation Other Exercises: LBD, grooming, self-feeding/drinking, bed mobility, sup<>sit, sit<>stand, side steps EOB   Shoulder Instructions      Home Living Family/patient expects to be discharged to:: Private residence Living Arrangements: Spouse/significant other(girlfriend) Available Help at Discharge: Family;Available 24 hours/day Type of Home: Mobile home Home Access: Stairs to enter Entrance Stairs-Number of Steps: 5 Entrance Stairs-Rails: Right;Left;Can reach both Home Layout: One level     Bathroom Shower/Tub: Occupational psychologist: Standard     Home Equipment: Grab bars - tub/shower;Cane - single point;Wheelchair - [employer] - 2 wheels;Walker - 4 wheels;Bedside commode;Shower seat;Grab bars - toilet          Prior Functioning/Environment Level of Independence: Needs assistance  Gait / Transfers Assistance Needed: Pt reports uses w/c or 4WW for household amublation ADL's / Homemaking Assistance Needed: Pt reports girlfriend assists c IADLs and LBD   Comments: Pt reports 1 fall in last 6 months - unable to state what happened to cause fall         OT Problem List: Decreased strength;Decreased range of motion;Decreased activity tolerance;Decreased  coordination;Decreased safety awareness      OT Treatment/Interventions: Self-care/ADL training;Therapeutic exercise;Energy conservation;DME and/or AE instruction;Therapeutic activities;Patient/family education;Balance training    OT Goals(Current goals can be found in the care plan section) Acute Rehab OT Goals Patient Stated Goal: To get stronger walking OT Goal Formulation: With patient Time For Goal Achievement: 02/03/20 Potential to Achieve Goals: Good ADL Goals Pt Will Perform Grooming: with set-up;sitting Pt Will Transfer to Toilet: with supervision;stand pivot transfer;bedside commode(c LRAD RPN) Pt Will Perform Toileting - Clothing Manipulation and hygiene: with min guard assist;sit to/from stand(c LRAD PRN)  OT Frequency: Min 2X/week   Barriers to D/C: Inaccessible home environment          Co-evaluation              AM-PAC OT "6 Clicks" Daily Activity     Outcome Measure Help from another person eating meals?: None Help from another person taking care of personal grooming?: A Little Help from another person toileting, which includes using toliet, bedpan, or urinal?: A Little Help from another person bathing (including washing, rinsing, drying)?: A Little Help from another person to put on and taking off regular upper body clothing?: None Help  from another person to put on and taking off regular lower body clothing?: A Lot 6 Click Score: 19   End of Session Equipment Utilized During Treatment: Rolling walker;Oxygen(3.5L [place]) Nurse Communication: Mobility status  Activity Tolerance: Patient tolerated treatment well Patient left: in bed;with call bell/phone within reach;with nursing/sitter in room(RN in room at end of session)  OT Visit Diagnosis: Unsteadiness on feet (R26.81);Other abnormalities of gait and mobility (R26.89)                Time: 8022-3361 OT Time Calculation (min): 27 min Charges:  OT General Charges $OT Visit: 1 Visit OT Evaluation $OT Eval  Moderate Complexity: 1 Mod OT Treatments $Self Care/Home Management : 8-22 mins $Therapeutic Activity: 8-22 mins  Dessie Coma, M.S. OTR/L  01/20/20, 10:39 AM

## 2020-01-20 NOTE — Progress Notes (Addendum)
[place] [facility] [place] NAME: Scott [name]    MR#:  [id number]  DATE OF BIRTH:  [birth year]  SUBJECTIVE:  patient denies any complaints. Denies any pain. Shortness of breath with activity. Sleeps during the day and is up at night--due to his PD habit (per dter)  REVIEW OF SYSTEMS:   Review of Systems  Constitutional: Negative for chills, fever and weight loss.  HENT: Negative for ear discharge, ear pain and nosebleeds.   Eyes: Negative for blurred vision, pain and discharge.  Respiratory: Positive for shortness of breath. Negative for sputum production, wheezing and stridor.   Cardiovascular: Negative for chest pain, palpitations, orthopnea and PND.  Gastrointestinal: Negative for abdominal pain, diarrhea, nausea and vomiting.  Genitourinary: Negative for frequency and urgency.  Musculoskeletal: Negative for back pain and joint pain.  Neurological: Positive for weakness. Negative for sensory change, speech change and focal weakness.  Psychiatric/Behavioral: Negative for depression and hallucinations. The patient is not nervous/anxious.    Tolerating Diet:yes Tolerating PT: pending  DRUG ALLERGIES:  No Known Allergies  VITALS:  Blood pressure (!) 110/48, pulse 77, temperature 97.8 F (36.6 C), temperature source Oral, resp. rate 18, height 5\' 9"  (1.753 m), weight 100.7 kg, SpO2 98 %.  PHYSICAL EXAMINATION:   Physical Exam  GENERAL:  59 y.o.-year-old patient lying in the bed with no acute distress. Appears chronically ill, obese EYES: Pupils equal, round, reactive to light and accommodation. No scleral icterus.   HEENT: Head atraumatic, normocephalic. Oropharynx and nasopharynx clear.  NECK:  Supple, no jugular venous distention. No thyroid enlargement, no tenderness.  LUNGS:  decreased breath sounds bilaterally, no wheezing, rales, rhonchi. No use of accessory muscles of respiration.  CARDIOVASCULAR: S1, S2 normal. No murmurs, rubs,  or gallops.  ABDOMEN: Soft, nontender, nondistended. Bowel sounds present. No organomegaly or mass.  EXTREMITIES: edema ++ NEUROLOGIC: Cranial nerves II through XII are intact. No focal Motor or sensory deficits b/l.   PSYCHIATRIC:  patient is alert and oriented x 3.  SKIN: dressing on both heels bilaterally. Pressure Injury 01/12/20 Coccyx Stage 2 -  Partial thickness loss of dermis presenting as a shallow open injury with a red, pink wound bed without slough. stage 2 pressure ulcer  (Active)  01/12/20 2330  Location: Coccyx  Location Orientation:   Staging: Stage 2 -  Partial thickness loss of dermis presenting as a shallow open injury with a red, pink wound bed without slough.  Wound Description (Comments): stage 2 pressure ulcer   Present on Admission: Yes     Pressure Injury 01/12/20 Heel (Active)  01/12/20 2330  Location: Heel  Location Orientation:   Staging:   Wound Description (Comments):   Present on Admission:         LABORATORY PANEL:  CBC Recent Labs  Lab 01/20/20 0814  WBC 12.9*  HGB 8.7*  HCT 29.4*  PLT 282    Chemistries  Recent Labs  Lab 01/20/20 0814  NA 137  K 3.6  CL 97*  CO2 30  GLUCOSE 84  BUN 28*  CREATININE 5.18*  CALCIUM 8.0*   Cardiac Enzymes No results for input(s): TROPONINI in the last 168 hours. RADIOLOGY:  No results found. ASSESSMENT AND PLAN:  FLEMON KELTY is an 59 y.o. male with medical history significant forESRD on peritoneal dialysis, chronic systolic CHF (EF 11-91% by TTE 09/18/2018), CAD, insulin-dependent type 2 diabetes, anemia of chronic disease, hypertension, hyperlipidemia, peripheral vascular disease whois admitted with acute respiratory  failure with hypoxia due to acute on chronic systolic CHF in setting of ESRD on PD  Acute respiratory failure with hypoxia due to acute on chronic systolic CHF in setting of ESRD on peritoneal dialysis: Requiring 4 L oxygen via nasal cannula - CT Angio to r/o PE - negatvie for  PE, showed large pleural effusion - 2D echo showed EF estimated at 25 to 30%.  Appreciate assistance from cardiologist. -UF with HD-->11liters removal -pt will need oxygen to go home with.  End Stage Renal Disease: now on HD dialysis.   -Status post temporary femoral  dialysis catheter placement by vascular surgery on 5/15 -removed femoral catheter on may 21st -s/ptunneled dialysis catheter on5/17.   - patient has outpatient chair time set up completed  Persistent hypotension -Continue midodrine.  -Echo shows severe global hypokinesis. EF 25-30% -Tachycardia has improved.  CAD/hyperlipidemia Chest pain.  Mildly elevated troponin likely from demand ischemia. 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 -Continue aspirin and statin  Anemia of chronic kidney disease: stable withoutobvious bleeding. EPO per nephrology at dialysis  Insulin-dependent type 2 diabetes: - sensitive SSI while in hospital and adjust as needed.  Nonhealing vascular wounds to bilateral feet. 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 Remaining toes are necrotic and nonintact on plantar surface - Present on admission - appreciate WOC nurse eval and input - Wound type:Vascualr wounds and pressure/moisture to buttocks. - Measurement:sacrum: 3 cm x 2.4 cm slough to wound bed - dressing changes per [place] team  Weakness Physical therapist has been consulted.  Depression with ? suicidal ideation  d/c sitter per Psychiatrist.  Right arm swelling No DVT on venous duplex  D/w dtter [name] at length. Pt is FULL code. He is eager to go home. PT eval pending -OT  rec [place] Patient is in agreement to work with physical therapy. He already has outpatient chair time. Depending on how he performs with physical therapy will consult TOC for discharge  planning.   Procedures:HD cath placement Family communication :dter shonna today Consults : cardiology, nephrology, vascular CODE STATUS: Full code DVT Prophylaxis :heparin  Status is: Inpatient  Remains inpatient appropriate because:Physical therapy consultation pending.   Dispo: The patient is from: Home              Anticipated d/c is [id number]  Home              Anticipated d/c date is: 1 day              Patient currently is not medically stable to d/c.  TOTAL TIME TAKING CARE OF THIS PATIENT: *25 minutes.  >50% time spent on counselling and coordination of care  Note: This dictation was prepared with Dragon dictation along with smaller phrase technology. Any transcriptional errors that result from this process are unintentional.  Fritzi Mandes M.D    [facility]   CC: Primary care physician; [facility], [place] ServicesPatient ID: DAWSYN RAMSARAN, male   DOB: Feb 20, 1961, 59 y.o.   MRN: [id number]

## 2020-01-20 NOTE — Progress Notes (Signed)
[place] visited pt. as follow-up from prior visit on Thursday; pt. in bed lying down when [facility] entered.  He said he had been at dialysis for much of the afternoon and was feeling tired.  Pt. has decided to continue with dialysis in hopes that 'it might slowly get me back to a better position' as his doctor reportedly told him this AM.  [place] asked if pt. feels any more peaceful or settled after our conversation about faith on Thursday --> pt. says that he does feel less anxious.  Pt. seemed very tired and so [place] asked to pray for him before excusing himself; pt. almost fell asleep during prayer.  [place] will continue to follow-up as needed and remains available.    01/20/20 1630  Clinical Encounter Type  Visited With Patient  Visit Type Follow-up;Spiritual support;Social support  Referral From Palliative care team  Consult/Referral To Chaplain  Spiritual Encounters  Spiritual Needs Emotional;Prayer  Stress Factors  Patient Stress Factors Health changes;Major life changes

## 2020-01-20 NOTE — Progress Notes (Signed)
Patient ID: Scott [name], male   DOB: 1961-01-13, 59 y.o.   MRN: [id number]  Spoke at length with pt's dter [name] expressed that pt would like to revoke the DNR order. He is FULL CODE.  D/w dter discharge plans likely tomorrow . Pt getting HD today Family had question about HD transportation and will inform TOC to get in touch with family  [name] [phone]

## 2020-01-20 NOTE — Progress Notes (Signed)
Scott [name]  MRN: [id number]  DOB/AGE: 1961/05/19 59 y.o.  Primary Care Physician:[facility] [place] date: 01/12/2020  Chief Complaint:  Chief Complaint  Patient presents with  . Shortness of Breath    S-Pt presented on  01/12/2020 with  Chief Complaint  Patient presents with  . Shortness of Breath  .    Pt today feels better    Pt seen on HD. Pt tolerating tx well    . vitamin C  500 mg Oral BID  . aspirin EC  81 mg Oral Daily  . atorvastatin  40 mg Oral Daily  . calcium acetate  2,001 mg Oral TID WC  . Chlorhexidine Gluconate Cloth  6 each Topical Q0600  . collagenase   Topical Daily  . epoetin (EPOGEN/PROCRIT) injection  10,000 Units Intravenous Q T,Th,Sa-HD  . feeding supplement (NEPRO CARB STEADY)  237 mL Oral BID BM  . FLUoxetine  20 mg Oral Daily  . heparin  5,000 Units Subcutaneous Q8H  . midodrine  10 mg Oral TID WC  . multivitamin  1 tablet Oral QHS  . pantoprazole  40 mg Oral Daily  . sodium chloride flush  3 mL Intravenous Q12H         DPO:EUMPN from the symptoms mentioned above,there are no other symptoms referable to all systems reviewed.  Physical Exam: Vital signs in last 24 hours: Temp:  [97.4 F (36.3 C)-98 F (36.7 C)] 98 F (36.7 C) (05/22 0828) Pulse Rate:  [48-91] 91 (05/22 0828) Resp:  [0-21] 14 (05/22 0828) BP: (80-123)/(23-93) 116/70 (05/22 0828) SpO2:  [90 %-100 %] 98 % (05/22 0828) Weight:  [100.7 kg] 100.7 kg (05/22 0202) Weight change: -4.147 kg Last BM Date: 01/18/20  Intake/Output from previous day: 05/21 0701 - 05/22 0700 In: -  Out: 2500  No intake/output data recorded.   Physical Exam: General- pt is awake,alert, oriented to time place and person Resp- No acute REsp distress, Rhonchi+ CVS- S1S2 regular in rate and rhythm GIT- BS+, soft, NT, ND EXT- 2+ LE Edema,  No Cyanosis Access- tunneled cath in situ   Lab Results: CBC Recent Labs    01/19/20 1014 01/20/20 0814  WBC 12.8* 12.9*   HGB 9.2* 8.7*  HCT 30.7* 29.4*  PLT 268 282    BMET Recent Labs    01/19/20 1014 01/20/20 0814  NA 138 137  K 3.8 3.6  CL 96* 97*  CO2 30 30  GLUCOSE 121* 84  BUN 26* 28*  CREATININE 4.33* 5.18*  CALCIUM 7.9* 8.0*    MICRO Recent Results (from the past 240 hour(s))  SARS Coronavirus 2 by RT PCR (hospital order, performed in [facility] hospital lab) Nasopharyngeal Nasopharyngeal Swab     Status: None   Collection Time: 01/12/20  9:08 PM   Specimen: Nasopharyngeal Swab  Result Value Ref Range Status   SARS Coronavirus 2 NEGATIVE NEGATIVE Final    Comment: (NOTE) SARS-CoV-2 target nucleic acids are NOT DETECTED. The SARS-CoV-2 RNA is generally detectable in upper and lower respiratory specimens during the acute phase of infection. The lowest concentration of SARS-CoV-2 viral copies this assay can detect is 250 copies / mL. A negative result does not preclude SARS-CoV-2 infection and should not be used as the sole basis for treatment or other patient management decisions.  A negative result may occur with improper specimen collection / handling, submission of specimen other than nasopharyngeal swab, presence of viral mutation(s) within the areas targeted by this assay,  and inadequate number of viral copies (<250 copies / mL). A negative result must be combined with clinical observations, patient history, and epidemiological information. Fact Sheet for Patients:   StrictlyIdeas.no Fact Sheet for Healthcare Providers: BankingDealers.co.za This test is not yet approved or cleared  by the [place] FDA and has been authorized for detection and/or diagnosis of SARS-CoV-2 by FDA under an Emergency Use Authorization (EUA).  This EUA will remain in effect (meaning this test can be used) for the duration of the COVID-19 declaration under Section 564(b)(1) of the Act, 21 U.S.C. section 360bbb-3(b)(1), unless the authorization is  terminated or revoked sooner. Performed at [facility], [address] [place], [place] [address]   Body fluid culture     Status: None   Collection Time: 01/13/20 10:45 AM   Specimen: Peritoneal Washings; Body Fluid  Result Value Ref Range Status   Specimen Description   Final    PERITONEAL Performed at [facility], [address], [place], [place] [address]    Special Requests   Final    NONE Performed at [facility], [place], [place] [address]    Gram Stain NO WBC SEEN NO ORGANISMS SEEN   Final   Culture   Final    NO GROWTH 3 DAYS Performed at [facility], [place] [address], [place], [place] [address]    Report Status 01/17/2020 FINAL  Final  Urine Culture     Status: None   Collection Time: 01/14/20  3:30 AM   Specimen: Urine, Random  Result Value Ref Range Status   Specimen Description   Final    URINE, RANDOM Performed at [facility], [address], [place], [place] [address]    Special Requests   Final    NONE Performed at [facility], [address] [place], [place] [address]    Culture   Final    NO GROWTH Performed at [facility], [place] [address], [place], [place] [address]    Report Status 01/15/2020 FINAL  Final      Lab Results  Component Value Date   PTH 127 (H) 07/21/2019   CALCIUM 8.0 (L) 01/20/2020   PHOS 5.1 (H) 01/20/2020               Impression:  Patient is a 59 year old Caucasian male with a past medical history of ESRD on PD, Hypertension, peripheral sclerosis, diabetes mellitus type 2, s/p left toe amputation who was admitted to the hospital on May 14 with chief complaint of ESRD, acute respiratory failure with hypoxia, CHF.  1)Renal  . End-stage renal disease. Patient was on outpatient peritoneal dialysis and now has been transitioned to hemodialysis. Patient had UF treatment done yesterday. Patient being dialyzed today  2)  hypotension Patient diuretics have been held Patient did respond to IV albumin Patient is now on midodrine  3)Anemia of chronic disease  HGb at goal (9--11)   4) secondary hyperparathyroidism -CKD Mineral-Bone Disorder   Secondary Hyperparathyroidism present . Phosphorus at goal. Patient is on binders  5)Peripheral vascular disease Patient has dry gangrene of the feet Patient is being closely followed by podiatrist, vascular surgeon and the primary team.   6) electrolytes   sodium Normonatremic   potassium Normokalemic    7)Acid base Co2 at goal     Plan:  We will continue current treatment plan     Milly Goggins s Ruthie Berch 01/20/2020, 9:00 AM

## 2020-01-20 NOTE — Progress Notes (Signed)
PT Cancellation Note  Patient Details Name: Scott [name] MRN: [id number] DOB: 1961-07-30   Cancelled Treatment:    Reason Eval/Treat Not Completed: Other (comment).  In HD and will retry at a later time.   Ramond Dial 01/20/2020, 1:11 PM   Mee Hives, PT MS Acute Rehab Dept. Number: [place] and [place]

## 2020-01-20 NOTE — Progress Notes (Signed)
This note also relates to the following rows which could not be included: Pulse Rate - Cannot attach notes to unvalidated device data BP - Cannot attach notes to unvalidated device data  Hd started  

## 2020-01-21 DIAGNOSIS — J9601 Acute respiratory failure with hypoxia: Secondary | ICD-10-CM

## 2020-01-21 MED ORDER — RENA-VITE PO TABS: 1.0000 | ORAL_TABLET | Freq: Every day | ORAL | 0 refills | Status: AC

## 2020-01-21 MED ORDER — MIDODRINE HCL 10 MG PO TABS: 10.0000 mg | ORAL_TABLET | Freq: Three times a day (TID) | ORAL | 1 refills | Status: AC

## 2020-01-21 MED ORDER — PANTOPRAZOLE SODIUM 40 MG PO TBEC: 40.0000 mg | DELAYED_RELEASE_TABLET | Freq: Every day | ORAL | 0 refills | Status: AC

## 2020-01-21 MED ORDER — NEPRO/CARBSTEADY PO LIQD: 237.0000 mL | Freq: Two times a day (BID) | ORAL | 0 refills | Status: AC

## 2020-01-21 MED ORDER — ASCORBIC ACID 500 MG PO TABS: 500.0000 mg | ORAL_TABLET | Freq: Two times a day (BID) | ORAL | 0 refills | Status: AC

## 2020-01-21 MED ORDER — CALCIUM ACETATE (PHOS BINDER) 667 MG PO CAPS: 2001.0000 mg | ORAL_CAPSULE | Freq: Three times a day (TID) | ORAL | 1 refills | Status: AC

## 2020-01-21 MED ORDER — FLUOXETINE HCL 20 MG PO CAPS: 20.0000 mg | ORAL_CAPSULE | Freq: Every day | ORAL | 0 refills | Status: AC

## 2020-01-21 NOTE — Evaluation (Signed)
Physical Therapy Evaluation Patient Details Name: Scott [name] MRN: [id number] DOB: 12/27/1960 Today's Date: 01/21/2020   History of Present Illness  DELMUS WARWICK is a 59 y.o. male with medical history significant for ESRD on peritoneal dialysis, chronic systolic CHF (EF 86-57% by TTE 09/18/2018), CAD, insulin-dependent type 2 diabetes, anemia of chronic disease, hypertension, hyperlipidemia, peripheral vascular disease who presents to the ED for evaluation of progressive shortness of breath.  Patient states he has been having progressive shortness of breath over the last 2 weeks.   Clinical Impression  Pt was seen for mobility evaluation and in last two months has lost his mobility of gait along with transfers.  He is demonstrating better standing control with new ortho shoes for protection of skin changes and toe amputations on feet.  His steps to the chair were encouraging and have asked family to consider rehab, but he is really hoping to go directly home.  Follow up with pt acutely during his stay to make progress on all goals, focusing on standing endurance and safety with transfers.      Follow Up Recommendations CIR    Equipment Recommendations  Rolling walker with 5" wheels    Recommendations for Other Services Rehab consult     Precautions / Restrictions Precautions Precautions: Fall Precaution Comments: monitor skin changes on feet with ortho shoes used Restrictions Weight Bearing Restrictions: No      Mobility  Bed Mobility Overal bed mobility: Needs Assistance Bed Mobility: Supine to Sit     Supine to sit: Min assist;HOB elevated(pt reaches to PT to get assistance)        Transfers Overall transfer level: Needs assistance Equipment used: Rolling walker (2 wheeled);1 person hand held assist Transfers: Sit to/from Stand Sit to Stand: Mod assist;From elevated surface         General transfer comment: mod assist on side of  bed  Ambulation/Gait Ambulation/Gait assistance: Min assist Gait Distance (Feet): 5 Feet Assistive device: Rolling walker (2 wheeled);1 person hand held assist Gait Pattern/deviations: Step-to pattern;Wide base of support Gait velocity: reduced Gait velocity interpretation: <1.31 ft/sec, indicative of household ambulator General Gait Details: steps to transfer to chair wiht pt taking deliberate steps  Stairs            Wheelchair Mobility    Modified Rankin (Stroke Patients Only)       Balance Overall balance assessment: Needs assistance Sitting-balance support: Feet supported Sitting balance-Leahy Scale: Good     Standing balance support: Bilateral upper extremity supported;During functional activity Standing balance-Leahy Scale: Fair Standing balance comment: less than fair with dynamic mobility and requires walker for this                             Pertinent Vitals/Pain Pain Assessment: No/denies pain    Home Living Family/patient expects to be discharged to:: Private residence Living Arrangements: Spouse/significant other Available Help at Discharge: Family;Available 24 hours/day Type of Home: Mobile home Home Access: Stairs to enter Entrance Stairs-Rails: Right;Left;Can reach both Entrance Stairs-Number of Steps: 5(has one step at back door) Home Layout: One level Home Equipment: Grab bars - tub/shower;Cane - single point;Wheelchair - [employer] - 2 wheels;Walker - 4 wheels;Bedside commode;Shower seat;Grab bars - toilet      Prior Function Level of Independence: Needs assistance   Gait / Transfers Assistance Needed: Pt reports uses w/c or 4WW for household amublation  ADL's / Homemaking Assistance Needed: Pt reports girlfriend assists c  IADLs and LBD  Comments: pt reports no falls but endorsed one yesterday with OT     Hand Dominance   Dominant Hand: Right    Extremity/Trunk Assessment   Upper Extremity Assessment Upper  Extremity Assessment: Defer to OT evaluation    Lower Extremity Assessment Lower Extremity Assessment: Generalized weakness       Communication   Communication: No difficulties  Cognition Arousal/Alertness: Awake/alert;Lethargic(sleepy until sitting up on side of bed) Behavior During Therapy: Flat affect Overall Cognitive Status: Difficult to assess                                 General Comments: repetitive instructions for all information needed      General Comments General comments (skin integrity, edema, etc.): pt is up in recliner at home per his signficiant other, and can't usually stand well once up, uses chair lift feature    Exercises     Assessment/Plan    PT Assessment Patient needs continued PT services  PT Problem List Decreased strength;Decreased range of motion;Decreased activity tolerance;Decreased balance;Decreased mobility;Decreased coordination;Decreased cognition;Decreased knowledge of use of DME;Decreased safety awareness;Cardiopulmonary status limiting activity;Obesity       PT Treatment Interventions DME instruction;Gait training;Stair training;Functional mobility training;Therapeutic activities;Therapeutic exercise;Balance training;Neuromuscular re-education;Patient/family education    PT Goals (Current goals can be found in the Care Plan section)  Acute Rehab PT Goals Patient Stated Goal: get back to walking again PT Goal Formulation: With patient/family Time For Goal Achievement: 02/04/20 Potential to Achieve Goals: Good    Frequency Min 2X/week   Barriers to discharge Inaccessible home environment has stairs to enter house    Co-evaluation               AM-PAC PT "6 Clicks" Mobility  Outcome Measure Help needed turning from your back to your side while in a flat bed without using bedrails?: A Little Help needed moving from lying on your back to sitting on the side of a flat bed without using bedrails?: A Little Help  needed moving to and from a bed to a chair (including a wheelchair)?: A Little Help needed standing up from a chair using your arms (e.g., wheelchair or bedside chair)?: A Lot Help needed to walk in hospital room?: A Lot Help needed climbing 3-5 steps with a railing? : Total 6 Click Score: 14    End of Session Equipment Utilized During Treatment: Gait belt(on room air with sats 99%) Activity Tolerance: Patient tolerated treatment well;Treatment limited secondary to medical complications (Comment) Patient left: in chair;with call bell/phone within reach;with family/visitor present;with chair alarm set Nurse Communication: Mobility status PT Visit Diagnosis: Unsteadiness on feet (R26.81);Muscle weakness (generalized) (M62.81);Difficulty in walking, not elsewhere classified (R26.2)    Time: 7169-6789 PT Time Calculation (min) (ACUTE ONLY): 24 min   Charges:   PT Evaluation $PT Eval Moderate Complexity: 1 Mod PT Treatments $Therapeutic Activity: 8-22 mins       Ramond Dial 01/21/2020, 2:05 PM  Mee Hives, PT MS Acute Rehab Dept. Number: [facility] [place]

## 2020-01-21 NOTE — Progress Notes (Signed)
SATURATION QUALIFICATIONS: (This note is used to comply with regulatory documentation for home oxygen)  Patient Saturations on Room Air at Rest = 88%  Patient Saturations on Room Air while Ambulating = 83%  Patient Saturations on 3 Liters of oxygen while Ambulating = 93%  Please briefly explain why patient needs home oxygen:

## 2020-01-21 NOTE — Progress Notes (Signed)
MD order received in [facility] to discharge pt home with home health and home oxygen; TOC previously established home health physical therapy and nursing services with [place];  home oxygen with [place]; portable home oxygen previously delivered to Room 132; pt's oxygen transferred to portable O2 tank for discharge; verbally reviewed AVS with pt, no questions voiced at this time; pt discharged via wheelchair by nursing to the visitor's entrance on his portable O2 at 3L [place].

## 2020-01-21 NOTE — Discharge Summary (Signed)
Scott [name] at [place] NAME: Scott [name]    MR#:  [id number]  DATE OF BIRTH:  August 09, 1961  DATE OF ADMISSION:  01/12/2020 ADMITTING PHYSICIAN: Lenore Cordia, MD  DATE OF DISCHARGE: 01/21/2020  PRIMARY CARE PHYSICIAN: [facility] [place]    ADMISSION DIAGNOSIS:  ESRD (end stage renal disease) on dialysis ([place]) [N18.6, Z99.2] Acute respiratory failure with hypoxia (HCC) [J96.01] Congestive heart failure, unspecified HF chronicity, unspecified heart failure type ([place]) [I50.9]  DISCHARGE DIAGNOSIS:   Acute hypoxic respiratory failure secondary to acute on chronic systolic congestive heart failure-- now on oxygen severe cardiomyopathy EF of 25-30%. End-stage renal disease now transition to hemodialysis SECONDARY DIAGNOSIS:   Past Medical History:  Diagnosis Date  . Acute respiratory failure with hypoxia ([place]) 09/17/2018  . Arthritis    "left arm; right leg" (12/13/2014)  . Asthma   . CHF (congestive heart failure) ([place])   . Chronic disease anemia    Scott [name] 12/13/2014  . Chronic kidney disease (CKD), stage IV (severe) ([place])    Scott [name] 12/13/2014... on dialysis  . Complication of anesthesia    unable to urinate after CAPD urgery  . Continuous ambulatory peritoneal dialysis status ([place])   . Coronary artery disease    Scott [name] 12/13/2014  . Depression   . Dysrhythmia    patient unaware of irregular heartbeat  . GERD (gastroesophageal reflux disease)   . High cholesterol    Scott [name] 12/13/2014  . Hypertension   . Non-Q wave myocardial infarction ([place])    Scott [name] 12/13/2014  . PVD (peripheral vascular disease) ([place])    Scott [name] 12/13/2014  . Type II diabetes mellitus [facility])     HOSPITAL COURSE:  Scott [name] Herseyis an 59 y.o.malewith medical history significant forESRD on peritoneal dialysis, chronic systolic CHF (EF 86-16% by TTE 09/18/2018), CAD, insulin-dependent type 2 diabetes, anemia of chronic disease, hypertension,  hyperlipidemia, peripheral vascular disease whois admitted with acute respiratory failure with hypoxia due to acute on chronic systolic CHF in setting of ESRD on PD  Acute respiratory failure with hypoxia due to acute on chronic systolic CHF in setting of ESRD on peritoneal dialysis: Requiring 4 L oxygen via nasal cannula - CT Angio chest- negatvie for PE, showed mod right pleural effusion - 2D echo showed EF estimated at 25 to 30%.  -Appreciate assistance from [facility] cardiology -UF with HD--> about 7 liters removal -pt will need oxygen to go home   End Stage Renal Disease: now on HD dialysis.  -Status post temporary femoral  dialysis catheter placement by vascular surgery on 5/15 -removed femoral catheter on may 21st -s/ptunneled dialysis catheter on5/17.  - patient has outpatient chair time set up completed  Persistent hypotension -Continue midodrine.  -Echo shows severe global hypokinesis. EF 25-30% -Tachycardia has improved.  CAD/hyperlipidemia Chest pain. Mildly elevated troponin likely from demand ischemia.  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 -Continue aspirin and statin  Anemia of chronic kidney disease: stable withoutobvious bleeding. EPO per nephrology at dialysis  Insulin-dependent type 2 diabetes: - sensitive SSI while in hospital and adjust as needed. -d/ced Lantus--sugars 80-120  Nonhealing vascular wounds to bilateral feet. 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 Remaining toes are necrotic and nonintact on plantar surface - Present on admission - appreciate WOC nurse eval and input - Wound type:Vascualr wounds and pressure/moisture to buttocks. - Measurement:sacrum: 3 cm x 2.4 cm slough  to wound bed - dressing changes per [place] team  Weakness Physical therapist has been consulted--recommends STR--but pt wants to go home  Right arm  swelling No DVT on venous duplex  D/w Venna at length. Pt is FULL code. He is eager to go home. PT done -OT  rec [facility] He already has outpatient chair time.   Procedures:HD cath placement Family communication :dter shonna today Consults : cardiology, nephrology, vascular CODE STATUS: Full code DVT Prophylaxis :heparin  Status is: Inpatient   Dispo: The patient is from: Home  Anticipated d/c is ZO:XWRUEA  Home  Anticipated d/c date VW:UJWJX  Patient currently is not medically stable to d/c.   CONSULTS OBTAINED:  Treatment Team:  Nelva Bush, MD Eulas Post, MD  DRUG ALLERGIES:  No Known Allergies  DISCHARGE MEDICATIONS:   Allergies as of 01/21/2020   No Known Allergies     Medication List    STOP taking these medications   insulin aspart protamine- aspart (70-30) 100 UNIT/ML injection Commonly known as: NOVOLOG MIX 70/30   omeprazole 20 MG capsule Commonly known as: PRILOSEC Replaced by: pantoprazole 40 MG tablet     TAKE these medications   acetaminophen 500 MG tablet Commonly known as: TYLENOL Take 500-1,000 mg by mouth every 6 (six) hours as needed for mild pain or headache.   albuterol 108 (90 Base) MCG/ACT inhaler Commonly known as: VENTOLIN HFA Inhale 2 puffs into the lungs every 6 (six) hours as needed for wheezing or shortness of breath.   ascorbic acid 500 MG tablet Commonly known as: VITAMIN C Take 1 tablet (500 mg total) by mouth 2 (two) times daily.   aspirin EC 81 MG tablet Take 1 tablet (81 mg total) by mouth daily.   atorvastatin 40 MG tablet Commonly known as: LIPITOR Take 40 mg by mouth daily.   calcium acetate 667 MG capsule Commonly known as: PHOSLO Take 3 capsules (2,001 mg total) by mouth 3 (three) times daily with meals.   feeding supplement (NEPRO CARB STEADY) Liqd Take 237 mLs by mouth 2 (two) times daily between meals.   FLUoxetine 20 MG capsule Commonly known as:  PROZAC Take 1 capsule (20 mg total) by mouth daily. Start taking on: Jan 22, 2020   furosemide 80 MG tablet Commonly known as: LASIX Take 80 mg by mouth daily.   midodrine 10 MG tablet Commonly known as: PROAMATINE Take 1 tablet (10 mg total) by mouth 3 (three) times daily with meals.   multivitamin Tabs tablet Take 1 tablet by mouth at bedtime.   pantoprazole 40 MG tablet Commonly known as: PROTONIX Take 1 tablet (40 mg total) by mouth daily. Start taking on: Jan 22, 2020 Replaces: omeprazole 20 MG capsule            Durable Medical Equipment  (From admission, onward)         Start     Ordered   01/20/20 1010  For home use only DME oxygen  Once    Question Answer Comment  Length of Need Lifetime   Mode or (Route) Nasal cannula   Liters per Minute 3   Frequency Continuous (stationary and portable oxygen unit needed)   Oxygen conserving device Yes   Oxygen delivery system Gas      01/20/20 1009          If you experience worsening of your admission symptoms, develop shortness of breath, life threatening emergency, suicidal or homicidal thoughts you must seek medical attention immediately by calling  911 or calling your MD immediately  if symptoms less severe.  You Must read complete instructions/literature along with all the possible adverse reactions/side effects for all the Medicines you take and that have been prescribed to you. Take any new Medicines after you have completely understood and accept all the possible adverse reactions/side effects.   Please note  You were cared for by a hospitalist during your hospital stay. If you have any questions about your discharge medications or the care you received while you were in the hospital after you are discharged, you can call the unit and asked to speak with the hospitalist on call if the hospitalist that took care of you is not available. Once you are discharged, your primary care physician will handle any further  medical issues. Please note that NO REFILLS for any discharge medications will be authorized once you are discharged, as it is imperative that you return to your primary care physician (or establish a relationship with a primary care physician if you do not have one) for your aftercare needs so that they can reassess your need for medications and monitor your lab values. Today   SUBJECTIVE   No new complants GF at bedside  VITAL SIGNS:  Blood pressure 96/79, pulse 77, temperature 97.7 F (36.5 C), temperature source Axillary, resp. rate 16, height 5\' 9"  (1.753 m), weight 99.7 kg, SpO2 94 %.  I/O:    Intake/Output Summary (Last 24 hours) at 01/21/2020 1230 Last data filed at 01/20/2020 1430 Gross per 24 hour  Intake --  Output 2000 ml  Net -2000 ml    PHYSICAL EXAMINATION:  Physical Exam  GENERAL:  59 y.o.-year-old patient lying in the bed with no acute distress. Appears chronically ill, obese EYES: Pupils equal, round, reactive to light and accommodation. No scleral icterus.   HEENT: Head atraumatic, normocephalic. Oropharynx and nasopharynx clear.  NECK:  Supple, no jugular venous distention. No thyroid enlargement, no tenderness.  LUNGS:  decreased breath sounds bilaterally, no wheezing, rales, rhonchi. No use of accessory muscles of respiration.  CARDIOVASCULAR: S1, S2 normal. No murmurs, rubs, or gallops.  ABDOMEN: Soft, nontender, nondistended. Bowel sounds present. No organomegaly or mass.  EXTREMITIES: edema ++ NEUROLOGIC: Cranial nerves II through XII are intact. No focal Motor or sensory deficits b/l.   PSYCHIATRIC:  patient is alert and oriented x 3.  SKIN: dressing on both heels bilaterally. Pressure Injury 01/12/20 Coccyx Stage 2 -  Partial thickness loss of dermis presenting as a shallow open injury with a red, pink wound bed without slough. stage 2 pressure ulcer  (Active)  01/12/20 2330  Location: Coccyx  Location Orientation:   Staging: Stage 2 -  Partial  thickness loss of dermis presenting as a shallow open injury with a red, pink wound bed without slough.  Wound Description (Comments): stage 2 pressure ulcer   Present on Admission: Yes     Pressure Injury 01/12/20 Heel (Active)  01/12/20 2330  Location: Heel  Location Orientation:   Staging:   Wound Description (Comments):   Present on Admission:       DATA REVIEW:   CBC  Recent Labs  Lab 01/20/20 0814  WBC 12.9*  HGB 8.7*  HCT 29.4*  PLT 282    Chemistries  Recent Labs  Lab 01/20/20 0814  NA 137  K 3.6  CL 97*  CO2 30  GLUCOSE 84  BUN 28*  CREATININE 5.18*  CALCIUM 8.0*    Microbiology Results   Recent Results (from  the past 240 hour(s))  SARS Coronavirus 2 by RT PCR (hospital order, performed in [facility] hospital lab) Nasopharyngeal Nasopharyngeal Swab     Status: None   Collection Time: 01/12/20  9:08 PM   Specimen: Nasopharyngeal Swab  Result Value Ref Range Status   SARS Coronavirus 2 NEGATIVE NEGATIVE Final    Comment: (NOTE) SARS-CoV-2 target nucleic acids are NOT DETECTED. The SARS-CoV-2 RNA is generally detectable in upper and lower respiratory specimens during the acute phase of infection. The lowest concentration of SARS-CoV-2 viral copies this assay can detect is 250 copies / mL. A negative result does not preclude SARS-CoV-2 infection and should not be used as the sole basis for treatment or other patient management decisions.  A negative result may occur with improper specimen collection / handling, submission of specimen other than nasopharyngeal swab, presence of viral mutation(s) within the areas targeted by this assay, and inadequate number of viral copies (<250 copies / mL). A negative result must be combined with clinical observations, patient history, and epidemiological information. Fact Sheet for Patients:   StrictlyIdeas.no Fact Sheet for Healthcare  Providers: BankingDealers.co.za This test is not yet approved or cleared  by the [place] FDA and has been authorized for detection and/or diagnosis of SARS-CoV-2 by FDA under an Emergency Use Authorization (EUA).  This EUA will remain in effect (meaning this test can be used) for the duration of the COVID-19 declaration under Section 564(b)(1) of the Act, 21 U.S.C. section 360bbb-3(b)(1), unless the authorization is terminated or revoked sooner. Performed at [facility], [address] [place], [place] [address]   Body fluid culture     Status: None   Collection Time: 01/13/20 10:45 AM   Specimen: Peritoneal Washings; Body Fluid  Result Value Ref Range Status   Specimen Description   Final    PERITONEAL Performed at [facility], [address] [place], [place] [address]    Special Requests   Final    NONE Performed at [facility], [place], [place] [address]    Gram Stain NO WBC SEEN NO ORGANISMS SEEN   Final   Culture   Final    NO GROWTH 3 DAYS Performed at [facility], [place] [address], [place], [place] [address]    Report Status 01/17/2020 FINAL  Final  Urine Culture     Status: None   Collection Time: 01/14/20  3:30 AM   Specimen: Urine, Random  Result Value Ref Range Status   Specimen Description   Final    URINE, RANDOM Performed at [facility], [address] [place], [place] [address]    Special Requests   Final    NONE Performed at [facility], [address], [place],  [address]    Culture   Final    NO GROWTH Performed at [facility], [place] [address] [place],  [address]    Report Status 01/15/2020 FINAL  Final    RADIOLOGY:  No results found.   CODE STATUS:     Code Status Orders  (From admission, onward)         Start     Ordered   01/20/20 1003  Full code  Continuous     01/20/20 1008        Code Status History     Date Active Date Inactive Code Status Order ID Comments User Context   01/19/2020 1607 01/20/2020 1008 DNR [id number]  Fritzi Mandes,  MD Inpatient   01/12/2020 2328 01/19/2020 1607 Full Code [id number]  Lenore Cordia, MD Inpatient   07/31/2019 1021 08/13/2019 0200 Full Code [id number]  Damita Lack, MD ED   07/17/2019 1358 07/21/2019 2002 Full Code [id number]  Para Skeans, MD ED   05/22/2019 1830 05/26/2019 1657 Full Code [id number]  Demetrios Loll, MD Inpatient   09/18/2018 0022 09/19/2018 1608 Full Code [id number]  Mayo, Pete Pelt, MD ED   03/09/2018 1206 03/12/2018 1712 Full Code [id number]  Henreitta Leber, MD Inpatient   01/17/2017 0338 01/20/2017 1834 Full Code [id number]  Harrie Foreman, MD ED   11/13/2016 1013 11/20/2016 1256 Full Code [id number]  Hillary Bow, MD ED   01/09/2015 1019 01/10/2015 1518 Full Code [id number]  Max Sane, MD Inpatient   12/18/2014 1620 12/19/2014 1627 Full Code [id number]  Charolette Forward, MD Inpatient   12/14/2014 1600 12/18/2014 1620 Full Code [id number]  Charolette Forward, MD Inpatient   12/13/2014 1324 12/14/2014 1600 Full Code [id number]  Charolette Forward, MD Inpatient   Advance Care Planning Activity       TOTAL TIME TAKING CARE OF THIS PATIENT: *40* minutes.    Fritzi Mandes M.D  [facility]    CC: Primary care physician; [employer] [employer]

## 2020-01-21 NOTE — TOC Transition Note (Addendum)
Transition of Care [facility]) - CM/SW Discharge Note   Patient Details  Name: Scott [name] MRN: [id number] Date of Birth: February 26, 1961  Transition of Care [facility]) CM/SW Contact:  Boris Sharper, LCSW Phone Number: 01/21/2020, 11:50 AM   Clinical Narrative:    Pt medically stable for discharge per MD. Pt will be transported home by family contact Veena. Pt will be followed by [facility] for PT and Nursing. CSW contacted Jermaine with [facility] for DME needs. Brenton Grills will be delivering oxygen to pt's room.    Final next level of care: Home w Home Health Services Barriers to Discharge: No Barriers Identified   Patient Goals and CMS Choice Patient states their goals for this hospitalization and ongoing recovery are:: I hope to feel better and then go home      Discharge Placement                Patient to be transferred to facility by: girlfriend Name of family member notified: Venna Patient and family notified of of transfer: 01/21/20  Discharge Plan and Services In-house Referral: Clinical Social Work   Post Acute Care Choice: Dialysis          DME Arranged: Oxygen DME Agency: Other - Comment([facility]) Date DME Agency Contacted: 01/21/20 Time DME Agency Contacted: 67 Representative spoke with at DME Agency: Melene Muller HH Arranged: RN, PT [facility] Agency: [place] ([place]) Date [place]: 01/21/20 Time [place]: [place] Representative spoke with at [place]: [place] ([place]) Interventions     Readmission Risk Interventions Readmission Risk Prevention Plan 08/04/2019 07/19/2019 07/19/2019  Transportation Screening Complete Complete Complete  Medication Review Press photographer) Referral to Pharmacy Complete Complete  [place] or Home Care Consult Complete Complete Complete  SW Recovery Care/Counseling Consult Complete - -  Palliative Care Screening Not Applicable Not Applicable -  [place] Not Applicable Not Applicable -  Some recent data might be hidden

## 2020-01-21 NOTE — Progress Notes (Signed)
ITHIEL LIEBLER  MRN: [id number]  DOB/AGE: Jan 12, 1961 60 y.o.  Primary Care Physician:[facility] [place] date: 01/12/2020  Chief Complaint:  Chief Complaint  Patient presents with  . Shortness of Breath    S-Pt presented on  01/12/2020 with  Chief Complaint  Patient presents with  . Shortness of Breath  . Patient is sitting comfortably at edge of the bed.  Patient voices no new concerns  Medications . vitamin C  500 mg Oral BID  . aspirin EC  81 mg Oral Daily  . atorvastatin  40 mg Oral Daily  . calcium acetate  2,001 mg Oral TID WC  . Chlorhexidine Gluconate Cloth  6 each Topical Q0600  . collagenase   Topical Daily  . epoetin (EPOGEN/PROCRIT) injection  10,000 Units Intravenous Q T,Th,Sa-HD  . feeding supplement (NEPRO CARB STEADY)  237 mL Oral BID BM  . FLUoxetine  20 mg Oral Daily  . heparin  5,000 Units Subcutaneous Q8H  . midodrine  10 mg Oral TID WC  . multivitamin  1 tablet Oral QHS  . pantoprazole  40 mg Oral Daily  . sodium chloride flush  3 mL Intravenous Q12H         JYN:WGNFA from the symptoms mentioned above,there are no other symptoms referable to all systems reviewed.  Physical Exam: Vital signs in last 24 hours: Temp:  [97 F (36.1 C)-97.8 F (36.6 C)] 97.7 F (36.5 C) (05/23 0714) Pulse Rate:  [73-79] 77 (05/23 0714) Resp:  [15-18] 16 (05/23 0714) BP: (47-114)/(34-81) 96/79 (05/23 0714) SpO2:  [94 %-97 %] 94 % (05/23 0714) Weight:  [99.7 kg] 99.7 kg (05/23 0500) Weight change: -0.953 kg Last BM Date: 01/18/20  Intake/Output from previous day: 05/22 0701 - 05/23 0700 In: 237 [P.O.:237] Out: 2000  No intake/output data recorded.   Physical Exam: General- pt is awake,alert, oriented to time place and person Resp- No acute REsp distress, minimal rhonchi+ CVS- S1S2 regular in rate and rhythm GIT- BS+, soft, NT, ND EXT- 1+ LE Edema,  No Cyanosis Access- tunneled cath in situ   Lab Results: CBC Recent Labs     01/19/20 1014 01/20/20 0814  WBC 12.8* 12.9*  HGB 9.2* 8.7*  HCT 30.7* 29.4*  PLT 268 282    BMET Recent Labs    01/19/20 1014 01/20/20 0814  NA 138 137  K 3.8 3.6  CL 96* 97*  CO2 30 30  GLUCOSE 121* 84  BUN 26* 28*  CREATININE 4.33* 5.18*  CALCIUM 7.9* 8.0*    MICRO Recent Results (from the past 240 hour(s))  SARS Coronavirus 2 by RT PCR (hospital order, performed in [facility] hospital lab) Nasopharyngeal Nasopharyngeal Swab     Status: None   Collection Time: 01/12/20  9:08 PM   Specimen: Nasopharyngeal Swab  Result Value Ref Range Status   SARS Coronavirus 2 NEGATIVE NEGATIVE Final    Comment: (NOTE) SARS-CoV-2 target nucleic acids are NOT DETECTED. The SARS-CoV-2 RNA is generally detectable in upper and lower respiratory specimens during the acute phase of infection. The lowest concentration of SARS-CoV-2 viral copies this assay can detect is 250 copies / mL. A negative result does not preclude SARS-CoV-2 infection and should not be used as the sole basis for treatment or other patient management decisions.  A negative result may occur with improper specimen collection / handling, submission of specimen other than nasopharyngeal swab, presence of viral mutation(s) within the areas targeted by this assay, and inadequate number  of viral copies (<250 copies / mL). A negative result must be combined with clinical observations, patient history, and epidemiological information. Fact Sheet for Patients:   StrictlyIdeas.no Fact Sheet for Healthcare Providers: BankingDealers.co.za This test is not yet approved or cleared  by the [place] FDA and has been authorized for detection and/or diagnosis of SARS-CoV-2 by FDA under an Emergency Use Authorization (EUA).  This EUA will remain in effect (meaning this test can be used) for the duration of the COVID-19 declaration under Section 564(b)(1) of the Act, 21  U.S.C. section 360bbb-3(b)(1), unless the authorization is terminated or revoked sooner. Performed at [facility], [address], [place], [place] [address]   Body fluid culture     Status: None   Collection Time: 01/13/20 10:45 AM   Specimen: Peritoneal Washings; Body Fluid  Result Value Ref Range Status   Specimen Description   Final    PERITONEAL Performed at [facility], [address], [place], [place] [address]    Special Requests   Final    NONE Performed at [facility], [place], [place] [address]    Gram Stain NO WBC SEEN NO ORGANISMS SEEN   Final   Culture   Final    NO GROWTH 3 DAYS Performed at [facility], [place] [address], [place], [place] [address]    Report Status 01/17/2020 FINAL  Final  Urine Culture     Status: None   Collection Time: 01/14/20  3:30 AM   Specimen: Urine, Random  Result Value Ref Range Status   Specimen Description   Final    URINE, RANDOM Performed at [facility], [address] [place], [place] [address]    Special Requests   Final    NONE Performed at [facility], [address] [place], [place] [address]    Culture   Final    NO GROWTH Performed at [facility], [place] [address], [place], [place] [address]    Report Status 01/15/2020 FINAL  Final      Lab Results  Component Value Date   PTH 127 (H) 07/21/2019   CALCIUM 8.0 (L) 01/20/2020   PHOS 5.1 (H) 01/20/2020               Impression:  Patient is a 59 year old Caucasian male with a past medical history of ESRD on PD, Hypertension, peripheral sclerosis, diabetes mellitus type 2, s/p left toe amputation who was admitted to the hospital on May 14 with chief complaint of ESRD, acute respiratory failure with hypoxia, CHF.  1)Renal  . End-stage renal disease. Patient was on outpatient peritoneal dialysis and now has been transitioned to hemodialysis. Patient had UF treatment done  on Jan 19, 2020. Patient was last dialyzed on Jan 20, 2020 No need for renal replacement therapy today   2) hypotension Patient diuretics have been held Patient did respond to IV albumin Patient is now on midodrine  3)Anemia of chronic disease  HGb at goal (9--11)   4) secondary hyperparathyroidism -CKD Mineral-Bone Disorder   Secondary Hyperparathyroidism present . Phosphorus at goal. Patient is on binders  5)Peripheral vascular disease Patient has dry gangrene of the feet Patient is being closely followed by podiatrist, vascular surgeon and the primary team.   6) electrolytes   sodium Normonatremic   potassium Normokalemic    7)Acid base Co2 at goal     Plan:  We will continue current treatment plan  Bowie Delia s Theador Hawthorne 01/21/2020, 10:25 AM

## 2020-01-21 NOTE — Plan of Care (Signed)

## 2020-01-21 NOTE — Discharge Instructions (Signed)
Pt and family advised to start out pt HD from tuesday may 25th-  Patient to contact [facility] ([place]) to arrange transportation to Dialysis. [facility] - Physical Therapy and Nursing services. [place] (Oxygen)

## 2020-01-21 NOTE — Progress Notes (Signed)
Progress Note  Patient Name: Scott [name] Date of Encounter: 01/21/2020  Primary Cardiologist: Kate Sable, MD   Subjective   Had another session of dialysis yesterday.  Had 2 L of fluid removed.  Feels better.  Inpatient Medications    Scheduled Meds: . vitamin C  500 mg Oral BID  . aspirin EC  81 mg Oral Daily  . atorvastatin  40 mg Oral Daily  . calcium acetate  2,001 mg Oral TID WC  . Chlorhexidine Gluconate Cloth  6 each Topical Q0600  . collagenase   Topical Daily  . epoetin (EPOGEN/PROCRIT) injection  10,000 Units Intravenous Q T,Th,Sa-HD  . feeding supplement (NEPRO CARB STEADY)  237 mL Oral BID BM  . FLUoxetine  20 mg Oral Daily  . heparin  5,000 Units Subcutaneous Q8H  . midodrine  10 mg Oral TID WC  . multivitamin  1 tablet Oral QHS  . pantoprazole  40 mg Oral Daily  . sodium chloride flush  3 mL Intravenous Q12H   Continuous Infusions: . sodium chloride Stopped (01/13/20 0231)   PRN Meds: sodium chloride, acetaminophen **OR** acetaminophen, alum & mag hydroxide-simeth, diphenhydrAMINE, HYDROcodone-acetaminophen, ondansetron **OR** ondansetron (ZOFRAN) IV, ondansetron (ZOFRAN) IV   Vital Signs    Vitals:   01/20/20 1554 01/21/20 0030 01/21/20 0500 01/21/20 0714  BP: (!) 111/59 (!) 102/37  96/79  Pulse: 78 79  77  Resp: 15 18  16   Temp: (!) 97 F (36.1 C) 97.6 F (36.4 C)  97.7 F (36.5 C)  TempSrc: Oral Oral  Axillary  SpO2:  97%  94%  Weight:   99.7 kg   Height:        Intake/Output Summary (Last 24 hours) at 01/21/2020 1041 Last data filed at 01/20/2020 1430 Gross per 24 hour  Intake --  Output 2000 ml  Net -2000 ml   Last 3 Weights 01/21/2020 01/20/2020 01/19/2020  Weight (lbs) 219 lb 12.8 oz 221 lb 14.4 oz 231 lb 0.7 oz  Weight (kg) 99.7 kg 100.653 kg 104.8 kg      Telemetry    Sinus rhythm- Personally Reviewed  ECG    No new ECG obtained- Personally Reviewed  Physical Exam   GEN: No acute distress.   Neck: No  JVD Cardiac: RRR, no murmurs, rubs, or gallops.  Respiratory:  Decreased breath sounds at bases, clear anteriorly. GI: Soft, nontender, non-distended  MS: 1-2+ edema; No deformity. Neuro:  Nonfocal  Psych: Normal affect   Labs    High Sensitivity Troponin:   Recent Labs  Lab 01/12/20 2119 01/13/20 0009 01/13/20 1521  TROPONINIHS 60* 63* 49*      Chemistry Recent Labs  Lab 01/18/20 1039 01/19/20 1014 01/20/20 0814  NA 139 138 137  K 3.2* 3.8 3.6  CL 98 96* 97*  CO2 29 30 30   GLUCOSE 97 121* 84  BUN 24* 26* 28*  CREATININE 3.74* 4.33* 5.18*  CALCIUM 7.8* 7.9* 8.0*  ALBUMIN 3.0* 3.0* 2.9*  GFRNONAA 16* 14* 11*  GFRAA 19* 16* 13*  ANIONGAP 12 12 10      Hematology Recent Labs  Lab 01/18/20 1039 01/19/20 1014 01/20/20 0814  WBC 11.2* 12.8* 12.9*  RBC 2.69* 3.04* 2.90*  HGB 8.0* 9.2* 8.7*  HCT 26.5* 30.7* 29.4*  MCV 98.5 101.0* 101.4*  MCH 29.7 30.3 30.0  MCHC 30.2 30.0 29.6*  RDW 17.0* 16.9* 17.1*  PLT 214 268 282    BNPNo results for input(s): BNP, PROBNP in the last 168 hours.  DDimer No results for input(s): DDIMER in the last 168 hours.   Radiology    No results found.  Cardiac Studies  Echo 01/16/20 1. Left ventricular ejection fraction, by estimation, is 25 to 30%. The  left ventricle has severely decreased function. The left ventricle  demonstrates global hypokinesis. The left ventricular internal cavity size  was moderately dilated. There is mild  left ventricular hypertrophy. Left ventricular diastolic parameters are  consistent with Grade II diastolic dysfunction (pseudonormalization).  Elevated left atrial pressure.  2. Pulmonary artery pressure is at least mildly elevated (PASP 30-35 mmHg  plus central venous pressure). Right ventricular systolic function is  moderately reduced. The right ventricular size is mildly enlarged.  3. Left atrial size was mildly dilated.  4. Right atrial size was moderately dilated.  5. The mitral  valve is normal in structure. Trivial mitral valve  regurgitation. No evidence of mitral stenosis.  6. The aortic valve is tricuspid. Aortic valve regurgitation is not  visualized. Mild aortic valve sclerosis is present, with no evidence of  aortic valve stenosis.  7. Mildly dilated pulmonary artery.  Comparison(s): A prior study was performed on 09/18/2018. LVEF has  decreased from 35-40% to 25-30%.   Lexiscan 07/2019  There was no ST segment deviation noted during stress.  There is a medium defect of severe severity present in the apical anterior, apical septal, apical inferior and apex location.  Findings consistent with prior myocardial infarction.  This is an intermediate risk study.  The left ventricular ejection fraction is severely decreased (<30%).  Patient Profile     59 y.o. male ischemic cardiomyopathy, last EF 25 to 30%, end-stage renal disease previously on peritoneal dialysis, being seen for volume overload and CHF.  Assessment & Plan    A/P: 1.  Volume overload -due to failed peritoneal dialysis -Currently getting hemodialysis as per renal team -2L fluid taken out from yesterday's dialysis.  Has lost about 24 pounds since admission. -Midodrine for pressor support while getting HD.  2.  Ischemic cardiomyopathy, EF 25 to 30% -Last Myoview with no evidence for ischemia, fixed perfusion defect. -Holding CHF meds in light of low blood pressure and need for dialysis -Continue midodrine -CHF meds when patient is euvolemic and blood pressure stable.  3.  End-stage renal disease -Appreciate input from nephrology -Hemodialysis as per renal  4.  History of CAD/PCI -Aspirin, statin   Total encounter time 35 minutes  Greater than 50% was spent in counseling and coordination of care with the patient and family/mother     Signed, Kate Sable, MD  01/21/2020, 10:41 AM

## 2020-01-21 NOTE — Progress Notes (Signed)
Inpatient Rehab Admissions Coordinator Note:   Per PT recommendation, pt was screened for CIR candidacy by Gayland Curry, MS, CCC-SLP.  At this time we are not recommending an Inpatient Rehab consult.  Per TOC, pt is going home with [facility] services.  Please contact me with questions.     Gayland Curry, [place], [place] Admissions Coordinator [phone]

## 2020-01-23 ENCOUNTER — Telehealth: Payer: Self-pay | Admitting: Nurse Practitioner

## 2020-01-23 NOTE — Telephone Encounter (Signed)
Spoke with patient and explained that I was calling from [place] to schedule the Consult and he said that he was currently at dialysis and said he wouldn't be finished with it until 5.   I told patient that I would call him back in the AM and he was in agreement with this.

## 2020-01-26 ENCOUNTER — Telehealth: Payer: Self-pay | Admitting: Primary Care

## 2020-01-26 NOTE — Telephone Encounter (Signed)
Spoke with patient regarding Palliative services and all questions were answered and he was in agreement with starting services.  I have scheduled an In-person Consult for 02/02/20 @ 9 AM.

## 2020-02-02 ENCOUNTER — Other Ambulatory Visit: Payer: Medicare Other | Admitting: Primary Care

## 2020-02-02 ENCOUNTER — Other Ambulatory Visit: Payer: Self-pay

## 2020-02-02 ENCOUNTER — Telehealth: Payer: Self-pay | Admitting: Primary Care

## 2020-02-02 DIAGNOSIS — Z515 Encounter for palliative care: Secondary | ICD-10-CM | POA: Insufficient documentation

## 2020-02-02 DIAGNOSIS — N186 End stage renal disease: Secondary | ICD-10-CM

## 2020-02-02 NOTE — Telephone Encounter (Signed)
Opened in error

## 2020-02-02 NOTE — Progress Notes (Signed)
  [facility] Consult Note Telephone: [phone]  Fax: [phone]  PATIENT NAME: Scott [name] [address] [place] [address] [phone] (home)  DOB: 01/04/1961 MRN: [id number]  PRIMARY CARE PROVIDER:    [facility],  [address] [place] [address] [phone]  REFERRING PROVIDER:   [facility] [address],  [place] [address] [phone]  RESPONSIBLE PARTY:   Extended Emergency Contact Information Primary Emergency Contact: Love, Shonna Mobile Phone: [phone] Relation: Daughter Secondary Emergency Contact: Wood, Joshua Mobile Phone: [phone] Relation: Son Preferred language: English Interpreter needed? No  I met with patient and family in home.  ASSESSMENT AND RECOMMENDATIONS:   1. Advance Care Planning/Goals of Care: Goals include to maximize quality of life and symptom management. Our advance care planning conversation included a discussion about:     The value and importance of advance care planning   Experiences with loved ones who have been seriously ill or have died   Exploration of personal, cultural or spiritual beliefs that might influence medical decisions   Exploration of goals of care in the event of a sudden injury or illness   Identification and preparation of a healthcare agent   Review and updating  of an  advance directive document .  2. Symptom Management:   I met with Scott [name] and his wife in their home. He was ill appearing and tired, as he sleeps all day and is up all night. He goes to dialysis three days a week.  We discussed the indication for palliative care and the services that we could offer. We discussed his disease process. Much of the information was from his wife as he nodded off  frequently. He states he is in moderate pain ,has some neuropathies in his legs and chronic foot wounds. Wife states he had been on  gabapentin but now is not sure why he is no longer taking it. I would recommend a review to put him on a low-dose of gabapentin to see if this helps his leg pain.   Advance care planning was done and a new most form was created. Wife stated that they felt he was pressed into signing a do not resuscitate order and then did not want such. I am uploading a new most form with request for full scope of services and removing his DNR order.We discussed the indication for ongoing palliative following. We discussed indication for hospice at end of life and when he chooses or must d/c dialysis.  They consented  to my calling back in 1 to 2 months. They are currently receiving home health services from [facility] for PT and RN for wound management. Wound recently Rx with abx course x 7 days.  3. Family /Caregiver/Community Supports: [place] Has 6 children who help.   4. Cognitive / Functional decline: A and O but drowsy. Not able to ambulate due to foot pain/wounds. Needs assistance with most adls, all iadls.   5. Follow up Palliative Care Visit: Palliative care will continue to follow for goals of care clarification and symptom management. Return 4-8 weeks or prn.  I spent 75 minutes providing this consultation,  from 0915 to 1030. More than 50% of the time in this consultation was spent coordinating communication.   HISTORY OF PRESENT ILLNESS:  Scott [name] is a 59 y.o. year old male with multiple medical problems including foot   wounds, non wt bearing, immobility, ESRD on HD. Palliative Care was asked to follow this patient by consultation request of [facility] [place] to help address advance care planning and goals of care. This is the initial  visit.  CODE STATUS: FULL CODE  PPS: 30%  HOSPICE ELIGIBILITY/DIAGNOSIS: no not at this time due to aggressive treatments, HD.  PAST MEDICAL HISTORY:  Past Medical History:  Diagnosis Date  . Acute  respiratory failure with hypoxia ([place]) 09/17/2018  . Arthritis    "left arm; right leg" (12/13/2014)  . Asthma   . CHF (congestive heart failure) ([place])   . Chronic disease anemia    [name] 12/13/2014  . Chronic kidney disease (CKD), stage IV (severe) ([place])    [name] 12/13/2014... on dialysis  . Complication of anesthesia    unable to urinate after CAPD urgery  . Continuous ambulatory peritoneal dialysis status ([place])   . Coronary artery disease    [name] 12/13/2014  . Depression   . Dysrhythmia    patient unaware of irregular heartbeat  . GERD (gastroesophageal reflux disease)   . High cholesterol    [name] 12/13/2014  . Hypertension   . Non-Q wave myocardial infarction ([place])    [name] 12/13/2014  . PVD (peripheral vascular disease) ([place])    [name] 12/13/2014  . Type II diabetes mellitus ([place])     SOCIAL HX:  Social History   Tobacco Use  . Smoking status: Former Smoker    Packs/day: 1.00    Years: 30.00    Pack years: 30.00    Types: Cigarettes    Start date: 08/31/1984    Quit date: 11/30/2014    Years since quitting: 5.1  . Smokeless tobacco: Never Used  Substance Use Topics  . Alcohol use: Not Currently    ALLERGIES: No Known Allergies   PERTINENT MEDICATIONS:  Outpatient Encounter Medications as of 02/02/2020  Medication Sig  . acetaminophen (TYLENOL) 500 MG tablet Take 500-1,000 mg by mouth every 6 (six) hours as needed for mild pain or headache.  . albuterol (PROVENTIL HFA;VENTOLIN HFA) 108 (90 BASE) MCG/ACT inhaler Inhale 2 puffs into the lungs every 6 (six) hours as needed for wheezing or shortness of breath.  [name] ascorbic acid (VITAMIN C) 500 MG tablet Take 1 tablet (500 mg total) by mouth 2 (two) times daily.  [name] aspirin EC 81 MG tablet Take 1 tablet (81 mg total) by mouth daily.  [name] atorvastatin (LIPITOR) 40 MG tablet Take 40 mg by mouth daily.  . calcium acetate (PHOSLO) 667 MG capsule Take 3 capsules (2,001 mg total) by mouth 3 (three) times daily with meals.  [name]  FLUoxetine (PROZAC) 20 MG capsule Take 1 capsule (20 mg total) by mouth daily.  . furosemide (LASIX) 80 MG tablet Take 80 mg by mouth daily.  . midodrine (PROAMATINE) 10 MG tablet Take 1 tablet (10 mg total) by mouth 3 (three) times daily with meals.  . multivitamin (RENA-VIT) TABS tablet Take 1 tablet by mouth at bedtime.  . Nutritional Supplements (FEEDING SUPPLEMENT, NEPRO CARB STEADY,) LIQD Take 237 mLs by mouth 2 (two) times daily between meals.  . pantoprazole (PROTONIX) 40 MG tablet Take 1 tablet (40 mg total) by mouth daily.   No facility-administered encounter medications on file as of 02/02/2020.    PHYSICAL EXAM / ROS:   Current and past weights: 220 lbs, formerly 250 lbs. sleeps in chair General: NAD, frail appearing, obese Cardiovascular: no chest pain reported, no edema  Pulmonary: no cough,  no increased SOB, oxygen at 3 L. No nebs. 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   Jason Coop, NP [facility]  COVID-19 PATIENT SCREENING TOOL  Person answering questions: ____________Wife_______ _____   1.  Is the patient or any family member in the home showing any signs or symptoms regarding respiratory infection?               Person with Symptom- __________NA_________________  a. Fever                                                                          Yes___ No___          ___________________  b. Shortness of breath                                                    Yes___ No___          ___________________ c. Cough/congestion                                       Yes___  No___         ___________________ d. Body aches/pains                                                         Yes___  No___        ____________________ e. Gastrointestinal symptoms (diarrhea, nausea)           Yes___ No___        ____________________  2. Within the past 14 days, has anyone living in the home had any contact with someone with or under investigation for COVID-19?    Yes___ No_X_   Person __________________

## 2020-02-07 ENCOUNTER — Telehealth: Payer: Self-pay | Admitting: Primary Care

## 2020-02-07 NOTE — Telephone Encounter (Signed)
Telephone call from PCP to review care needs and hospice eligibility. We discussed patient's current state of health and diagnoses, prognosis and decisions regarding ongoing life extending treatment e.g. dialysis and surgeries. MD felt that patient was in decline and that the family needed more physical care. I will follow up with another visit soon. Education provided regarding conditions of participation for the hospice program to the PCP.

## 2020-02-21 ENCOUNTER — Ambulatory Visit: Payer: Medicare Other | Admitting: Physician Assistant

## 2020-02-29 DEATH — deceased

## 2020-03-22 ENCOUNTER — Encounter: Payer: Self-pay | Admitting: Primary Care

## 2020-03-25 NOTE — Telephone Encounter (Signed)
Opened in error

## 2020-10-17 IMAGING — DX DG CHEST 1V PORT
2 series · 2 of 2 positions shown · non-contrast
Comparison: September 17, 2018

CLINICAL DATA: Shortness of breath

EXAM:
PORTABLE CHEST 1 VIEW

[chest ap (1 of 2)]
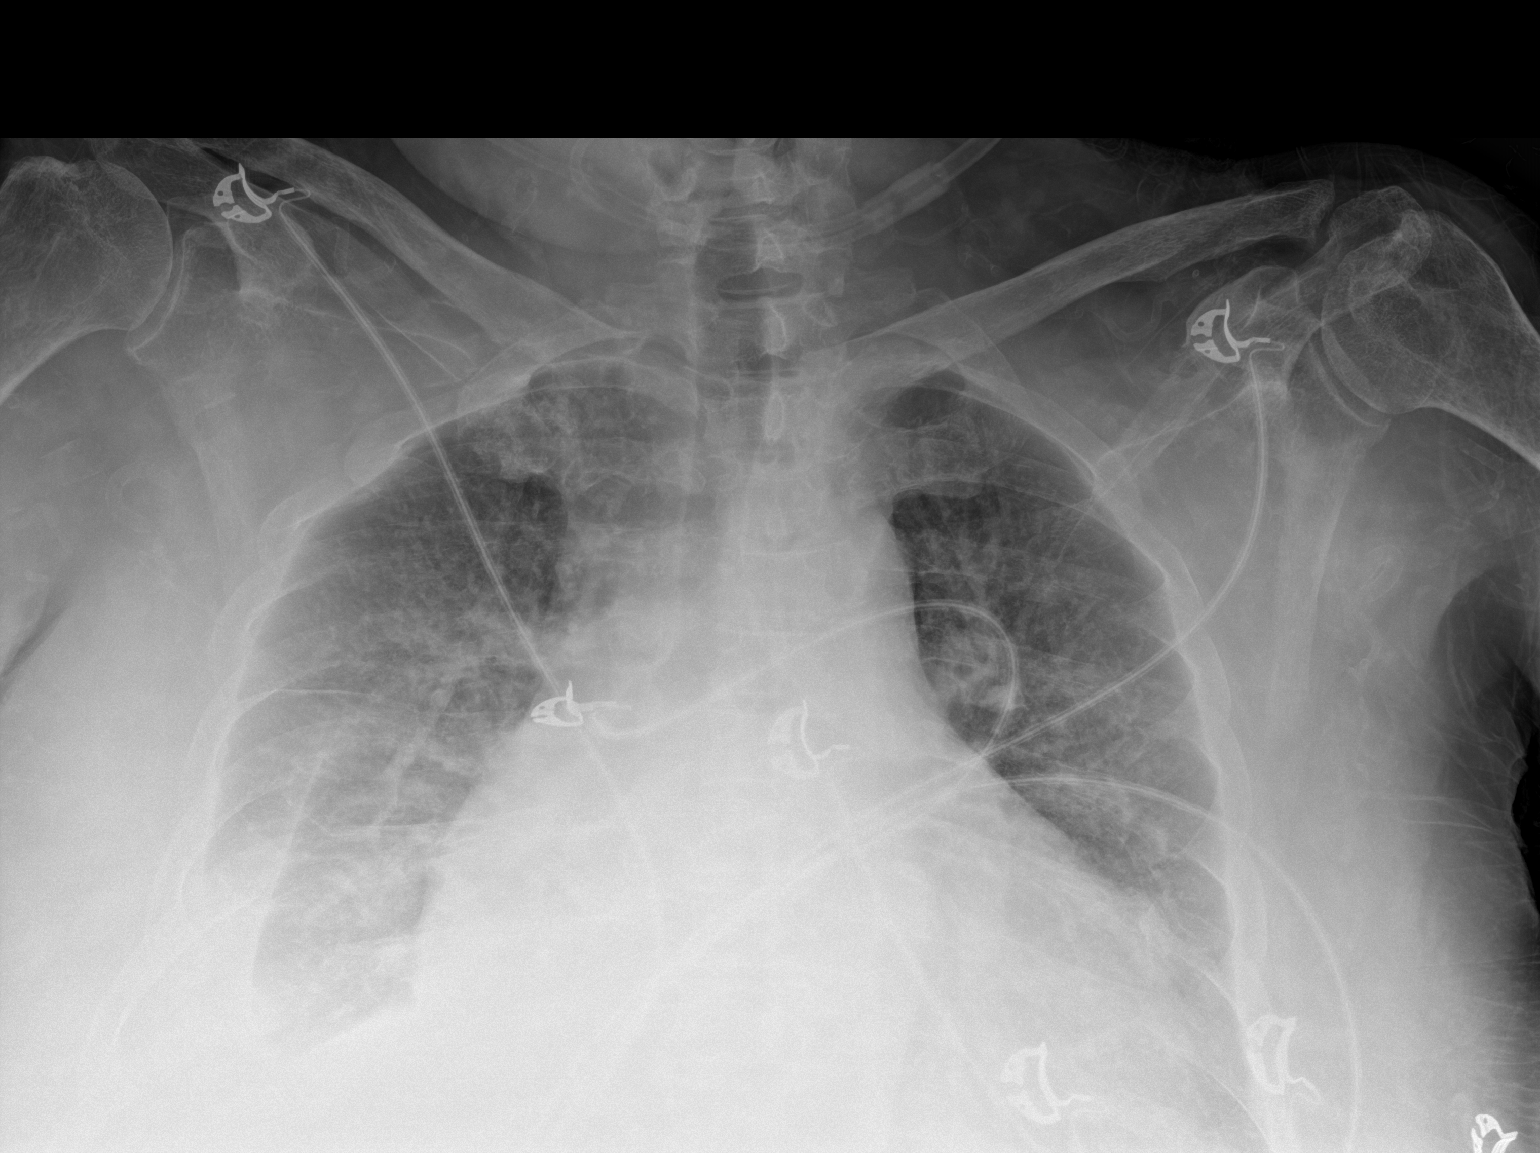

[chest ap (2 of 2)]
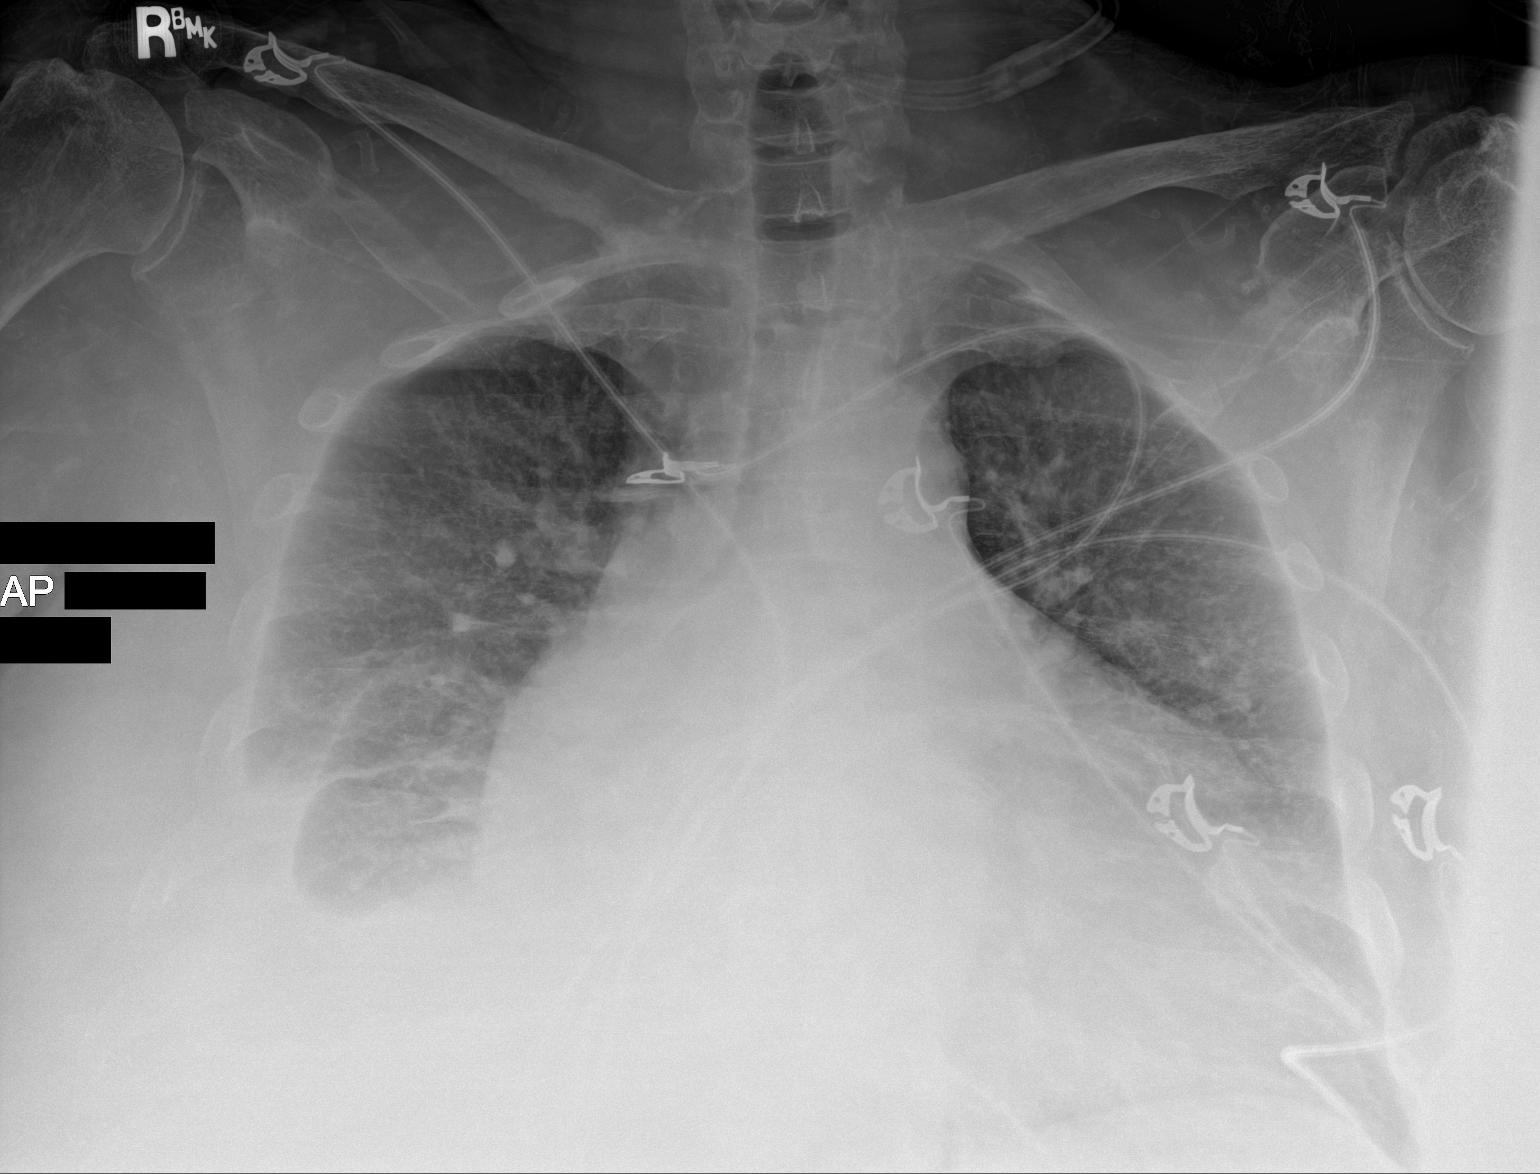

[2 of 2 positions shown; findings below may reference images not displayed]

FINDINGS: Again noted is marked cardiomegaly. There is diffusely increased
interstitial markings seen throughout both lungs. A moderate right
pleural effusion is noted. No acute osseous abnormality.
IMPRESSION: Moderate cardiomegaly and diffuse interstitial edema

Moderate right pleural effusion
